# Patient Record
Sex: Female | Born: 1940
Health system: Southern US, Community
[De-identification: ages and names within clinical notes are randomized; demographics above are authoritative.]

## PROBLEM LIST (undated history)

## (undated) DIAGNOSIS — G629 Polyneuropathy, unspecified: Secondary | ICD-10-CM

## (undated) DIAGNOSIS — I8000 Phlebitis and thrombophlebitis of superficial vessels of unspecified lower extremity: Secondary | ICD-10-CM

## (undated) DIAGNOSIS — R0602 Shortness of breath: Secondary | ICD-10-CM

## (undated) DIAGNOSIS — Z9889 Other specified postprocedural states: Secondary | ICD-10-CM

## (undated) DIAGNOSIS — E039 Hypothyroidism, unspecified: Secondary | ICD-10-CM

## (undated) DIAGNOSIS — D649 Anemia, unspecified: Secondary | ICD-10-CM

## (undated) DIAGNOSIS — K5900 Constipation, unspecified: Secondary | ICD-10-CM

## (undated) DIAGNOSIS — E119 Type 2 diabetes mellitus without complications: Secondary | ICD-10-CM

## (undated) DIAGNOSIS — I8390 Asymptomatic varicose veins of unspecified lower extremity: Secondary | ICD-10-CM

## (undated) DIAGNOSIS — F419 Anxiety disorder, unspecified: Secondary | ICD-10-CM

## (undated) DIAGNOSIS — E46 Unspecified protein-calorie malnutrition: Secondary | ICD-10-CM

## (undated) DIAGNOSIS — K859 Acute pancreatitis without necrosis or infection, unspecified: Secondary | ICD-10-CM

## (undated) DIAGNOSIS — D353 Benign neoplasm of craniopharyngeal duct: Secondary | ICD-10-CM

## (undated) DIAGNOSIS — I639 Cerebral infarction, unspecified: Secondary | ICD-10-CM

## (undated) DIAGNOSIS — H269 Unspecified cataract: Secondary | ICD-10-CM

## (undated) DIAGNOSIS — I10 Essential (primary) hypertension: Secondary | ICD-10-CM

## (undated) DIAGNOSIS — R112 Nausea with vomiting, unspecified: Secondary | ICD-10-CM

## (undated) DIAGNOSIS — M810 Age-related osteoporosis without current pathological fracture: Secondary | ICD-10-CM

## (undated) DIAGNOSIS — Z780 Asymptomatic menopausal state: Secondary | ICD-10-CM

## (undated) DIAGNOSIS — F32A Depression, unspecified: Secondary | ICD-10-CM

## (undated) DIAGNOSIS — E7800 Pure hypercholesterolemia, unspecified: Secondary | ICD-10-CM

## (undated) DIAGNOSIS — G9341 Metabolic encephalopathy: Secondary | ICD-10-CM

## (undated) DIAGNOSIS — N816 Rectocele: Secondary | ICD-10-CM

## (undated) DIAGNOSIS — F329 Major depressive disorder, single episode, unspecified: Secondary | ICD-10-CM

## (undated) DIAGNOSIS — G43909 Migraine, unspecified, not intractable, without status migrainosus: Secondary | ICD-10-CM

## (undated) DIAGNOSIS — D352 Benign neoplasm of pituitary gland: Secondary | ICD-10-CM

## (undated) HISTORY — DX: Essential (primary) hypertension: I10

## (undated) HISTORY — DX: Age-related osteoporosis without current pathological fracture: M81.0

## (undated) HISTORY — DX: Hypothyroidism, unspecified: E03.9

## (undated) HISTORY — DX: Type 2 diabetes mellitus without complications: E11.9

## (undated) HISTORY — DX: Benign neoplasm of craniopharyngeal duct: D35.3

## (undated) HISTORY — DX: Depression, unspecified: F32.A

## (undated) HISTORY — PX: CYSTOSCOPY: SUR368

## (undated) HISTORY — PX: ENDOVENOUS ABLATION SAPHENOUS VEIN W/ LASER: SUR449

## (undated) HISTORY — DX: Phlebitis and thrombophlebitis of superficial vessels of unspecified lower extremity: I80.00

## (undated) HISTORY — PX: BRAIN SURGERY: SHX531

## (undated) HISTORY — DX: Rectocele: N81.6

## (undated) HISTORY — PX: LAPAROSCOPIC NISSEN FUNDOPLICATION: SHX1932

## (undated) HISTORY — DX: Constipation, unspecified: K59.00

## (undated) HISTORY — DX: Unspecified cataract: H26.9

## (undated) HISTORY — DX: Acute pancreatitis without necrosis or infection, unspecified: K85.90

## (undated) HISTORY — PX: POSTERIOR REPAIR: SHX2254

## (undated) HISTORY — DX: Benign neoplasm of pituitary gland: D35.2

## (undated) HISTORY — DX: Anxiety disorder, unspecified: F41.9

## (undated) HISTORY — PX: APPENDECTOMY: SHX54

## (undated) HISTORY — DX: Pure hypercholesterolemia, unspecified: E78.00

## (undated) HISTORY — DX: Migraine, unspecified, not intractable, without status migrainosus: G43.909

## (undated) HISTORY — DX: Asymptomatic varicose veins of unspecified lower extremity: I83.90

## (undated) HISTORY — PX: CHOLECYSTECTOMY: SHX55

## (undated) HISTORY — PX: ABDOMINAL HYSTERECTOMY: SHX81

## (undated) HISTORY — DX: Asymptomatic menopausal state: Z78.0

---
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
## 1996-12-26 HISTORY — PX: EYE SURGERY: SHX253

## 1997-10-26 HISTORY — PX: PITUITARY EXCISION: SHX745

## 1998-11-03 ENCOUNTER — Ambulatory Visit (HOSPITAL_COMMUNITY): Admission: RE | Admit: 1998-11-03 | Discharge: 1998-11-03 | Payer: Self-pay | Admitting: Neurological Surgery

## 1998-11-03 ENCOUNTER — Encounter: Payer: Self-pay | Admitting: Neurological Surgery

## 1999-06-15 ENCOUNTER — Ambulatory Visit (HOSPITAL_COMMUNITY): Admission: RE | Admit: 1999-06-15 | Discharge: 1999-06-15 | Payer: Self-pay | Admitting: *Deleted

## 1999-11-02 ENCOUNTER — Ambulatory Visit (HOSPITAL_COMMUNITY): Admission: RE | Admit: 1999-11-02 | Discharge: 1999-11-02 | Payer: Self-pay | Admitting: Neurological Surgery

## 1999-11-02 ENCOUNTER — Encounter: Payer: Self-pay | Admitting: Neurological Surgery

## 2001-05-25 ENCOUNTER — Inpatient Hospital Stay (HOSPITAL_COMMUNITY): Admission: AD | Admit: 2001-05-25 | Discharge: 2001-05-30 | Payer: Self-pay | Admitting: Pulmonary Disease

## 2001-05-26 ENCOUNTER — Encounter: Payer: Self-pay | Admitting: Internal Medicine

## 2001-07-17 ENCOUNTER — Ambulatory Visit (HOSPITAL_COMMUNITY): Admission: RE | Admit: 2001-07-17 | Discharge: 2001-07-17 | Payer: Self-pay | Admitting: Obstetrics and Gynecology

## 2001-07-17 ENCOUNTER — Encounter: Payer: Self-pay | Admitting: Obstetrics and Gynecology

## 2001-07-24 ENCOUNTER — Other Ambulatory Visit: Admission: RE | Admit: 2001-07-24 | Discharge: 2001-07-24 | Payer: Self-pay | Admitting: Obstetrics and Gynecology

## 2001-11-13 ENCOUNTER — Encounter: Payer: Self-pay | Admitting: Neurological Surgery

## 2001-11-13 ENCOUNTER — Ambulatory Visit (HOSPITAL_COMMUNITY): Admission: RE | Admit: 2001-11-13 | Discharge: 2001-11-13 | Payer: Self-pay | Admitting: Neurological Surgery

## 2001-12-11 ENCOUNTER — Encounter: Admission: RE | Admit: 2001-12-11 | Discharge: 2002-03-11 | Payer: Self-pay | Admitting: Pulmonary Disease

## 2002-11-18 ENCOUNTER — Ambulatory Visit (HOSPITAL_COMMUNITY): Admission: RE | Admit: 2002-11-18 | Discharge: 2002-11-18 | Payer: Self-pay | Admitting: Internal Medicine

## 2003-11-11 ENCOUNTER — Ambulatory Visit (HOSPITAL_COMMUNITY): Admission: RE | Admit: 2003-11-11 | Discharge: 2003-11-11 | Payer: Self-pay | Admitting: Obstetrics and Gynecology

## 2005-02-02 ENCOUNTER — Ambulatory Visit (HOSPITAL_COMMUNITY): Admission: RE | Admit: 2005-02-02 | Discharge: 2005-02-02 | Payer: Self-pay | Admitting: Obstetrics and Gynecology

## 2005-04-28 ENCOUNTER — Ambulatory Visit (HOSPITAL_COMMUNITY): Admission: RE | Admit: 2005-04-28 | Discharge: 2005-04-28 | Payer: Self-pay | Admitting: Family Medicine

## 2005-06-15 ENCOUNTER — Ambulatory Visit: Payer: Self-pay | Admitting: Endocrinology

## 2005-09-26 ENCOUNTER — Ambulatory Visit (HOSPITAL_COMMUNITY): Admission: RE | Admit: 2005-09-26 | Discharge: 2005-09-26 | Payer: Self-pay | Admitting: Pulmonary Disease

## 2006-06-13 ENCOUNTER — Ambulatory Visit: Payer: Self-pay | Admitting: Endocrinology

## 2006-06-16 ENCOUNTER — Emergency Department (HOSPITAL_COMMUNITY): Admission: EM | Admit: 2006-06-16 | Discharge: 2006-06-16 | Payer: Self-pay | Admitting: Family Medicine

## 2006-10-20 ENCOUNTER — Ambulatory Visit: Payer: Self-pay | Admitting: Endocrinology

## 2006-10-20 LAB — CONVERTED CEMR LAB
ALT: 20 units/L (ref 0–40)
AST: 20 units/L (ref 0–37)
Albumin: 3.1 g/dL — ABNORMAL LOW (ref 3.5–5.2)
Alkaline Phosphatase: 32 units/L — ABNORMAL LOW (ref 39–117)
Bilirubin, Direct: 0.1 mg/dL (ref 0.0–0.3)
Chol/HDL Ratio, serum: 2.4
Cholesterol: 136 mg/dL (ref 0–200)
HDL: 57 mg/dL (ref >39.0)
Hgb A1c MFr Bld: 6.6 % — ABNORMAL HIGH (ref 4.6–6.0)
LDL Cholesterol: 55 mg/dL (ref 0–99)
Total Bilirubin: 0.7 mg/dL (ref 0.3–1.2)
Total Protein: 6.7 g/dL (ref 6.0–8.3)
Triglyceride fasting, serum: 119 mg/dL (ref 0–149)
VLDL: 24 mg/dL (ref 0–40)

## 2007-06-07 ENCOUNTER — Ambulatory Visit: Payer: Self-pay | Admitting: Endocrinology

## 2007-06-07 LAB — CONVERTED CEMR LAB
BUN: 14 mg/dL (ref 6–23)
CO2: 31 meq/L (ref 19–32)
Calcium: 9.3 mg/dL (ref 8.4–10.5)
Chloride: 102 meq/L (ref 96–112)
Cholesterol: 160 mg/dL (ref 0–200)
Creatinine, Ser: 0.7 mg/dL (ref 0.4–1.2)
Creatinine,U: 175.1 mg/dL
Free T4: 0.5 ng/dL — ABNORMAL LOW (ref 0.6–1.6)
GFR calc Af Amer: 108 mL/min
GFR calc non Af Amer: 89 mL/min
Glucose, Bld: 136 mg/dL — ABNORMAL HIGH (ref 70–99)
HDL: 59.3 mg/dL (ref >39.0)
Hgb A1c MFr Bld: 7.9 % — ABNORMAL HIGH (ref 4.6–6.0)
LDL Cholesterol: 70 mg/dL (ref 0–99)
Microalb Creat Ratio: 6.3 mg/g (ref 0.0–30.0)
Microalb, Ur: 1.1 mg/dL (ref 0.0–1.9)
Potassium: 3.8 meq/L (ref 3.5–5.1)
Sodium: 140 meq/L (ref 135–145)
TSH: 1.91 microintl units/mL (ref 0.35–5.50)
Total CHOL/HDL Ratio: 2.7
Triglycerides: 155 mg/dL — ABNORMAL HIGH (ref 0–149)
VLDL: 31 mg/dL (ref 0–40)

## 2007-09-14 ENCOUNTER — Encounter: Payer: Self-pay | Admitting: *Deleted

## 2007-09-14 DIAGNOSIS — G43909 Migraine, unspecified, not intractable, without status migrainosus: Secondary | ICD-10-CM

## 2007-09-14 DIAGNOSIS — E1142 Type 2 diabetes mellitus with diabetic polyneuropathy: Secondary | ICD-10-CM | POA: Insufficient documentation

## 2007-09-14 DIAGNOSIS — E119 Type 2 diabetes mellitus without complications: Secondary | ICD-10-CM

## 2007-09-14 DIAGNOSIS — R5381 Other malaise: Secondary | ICD-10-CM

## 2007-09-14 DIAGNOSIS — E039 Hypothyroidism, unspecified: Secondary | ICD-10-CM | POA: Insufficient documentation

## 2007-09-14 DIAGNOSIS — I8000 Phlebitis and thrombophlebitis of superficial vessels of unspecified lower extremity: Secondary | ICD-10-CM | POA: Insufficient documentation

## 2007-09-14 DIAGNOSIS — D352 Benign neoplasm of pituitary gland: Secondary | ICD-10-CM | POA: Insufficient documentation

## 2007-09-14 DIAGNOSIS — K859 Acute pancreatitis without necrosis or infection, unspecified: Secondary | ICD-10-CM | POA: Insufficient documentation

## 2007-09-14 DIAGNOSIS — N951 Menopausal and female climacteric states: Secondary | ICD-10-CM | POA: Insufficient documentation

## 2007-09-14 DIAGNOSIS — D353 Benign neoplasm of craniopharyngeal duct: Secondary | ICD-10-CM | POA: Insufficient documentation

## 2007-09-14 DIAGNOSIS — R5383 Other fatigue: Secondary | ICD-10-CM | POA: Insufficient documentation

## 2007-09-14 HISTORY — DX: Benign neoplasm of craniopharyngeal duct: D35.3

## 2007-09-14 HISTORY — DX: Benign neoplasm of pituitary gland: D35.2

## 2007-09-14 HISTORY — DX: Acute pancreatitis without necrosis or infection, unspecified: K85.90

## 2007-09-14 HISTORY — DX: Type 2 diabetes mellitus without complications: E11.9

## 2007-09-14 HISTORY — DX: Phlebitis and thrombophlebitis of superficial vessels of unspecified lower extremity: I80.00

## 2007-09-14 HISTORY — DX: Migraine, unspecified, not intractable, without status migrainosus: G43.909

## 2007-09-14 HISTORY — DX: Hypothyroidism, unspecified: E03.9

## 2007-09-20 ENCOUNTER — Ambulatory Visit (HOSPITAL_COMMUNITY): Admission: RE | Admit: 2007-09-20 | Discharge: 2007-09-20 | Payer: Self-pay | Admitting: Obstetrics and Gynecology

## 2007-11-19 ENCOUNTER — Ambulatory Visit: Payer: Self-pay | Admitting: Endocrinology

## 2007-11-20 LAB — CONVERTED CEMR LAB
FSH: 11.5 milliintl units/mL
Free T4: 0.8 ng/dL (ref 0.6–1.6)
Hgb A1c MFr Bld: 7.9 % — ABNORMAL HIGH (ref 4.6–6.0)
LH: 3.4 milliintl units/mL
TSH: 1.9 microintl units/mL (ref 0.35–5.50)

## 2007-11-27 ENCOUNTER — Telehealth (INDEPENDENT_AMBULATORY_CARE_PROVIDER_SITE_OTHER): Payer: Self-pay | Admitting: *Deleted

## 2007-12-17 ENCOUNTER — Telehealth (INDEPENDENT_AMBULATORY_CARE_PROVIDER_SITE_OTHER): Payer: Self-pay | Admitting: *Deleted

## 2008-01-30 ENCOUNTER — Telehealth (INDEPENDENT_AMBULATORY_CARE_PROVIDER_SITE_OTHER): Payer: Self-pay | Admitting: *Deleted

## 2008-02-01 ENCOUNTER — Ambulatory Visit: Payer: Self-pay | Admitting: Endocrinology

## 2008-02-01 DIAGNOSIS — T678XXA Other effects of heat and light, initial encounter: Secondary | ICD-10-CM | POA: Insufficient documentation

## 2008-02-01 LAB — CONVERTED CEMR LAB
Basophils Absolute: 0 10*3/uL (ref 0.0–0.1)
Basophils Relative: 0.3 % (ref 0.0–1.0)
Eosinophils Absolute: 0.6 10*3/uL (ref 0.0–0.6)
Eosinophils Relative: 5.1 % — ABNORMAL HIGH (ref 0.0–5.0)
HCT: 41 % (ref 36.0–46.0)
Hemoglobin: 14.2 g/dL (ref 12.0–15.0)
Hgb A1c MFr Bld: 8.4 % — ABNORMAL HIGH (ref 4.6–6.0)
Lymphocytes Relative: 26.5 % (ref 12.0–46.0)
MCHC: 34.6 g/dL (ref 30.0–36.0)
MCV: 93 fL (ref 78.0–100.0)
Monocytes Absolute: 0.4 10*3/uL (ref 0.2–0.7)
Monocytes Relative: 3.8 % (ref 3.0–11.0)
Neutro Abs: 7.1 10*3/uL (ref 1.4–7.7)
Neutrophils Relative %: 64.3 % (ref 43.0–77.0)
Platelets: 227 10*3/uL (ref 150–400)
RBC: 4.41 M/uL (ref 3.87–5.11)
RDW: 12.6 % (ref 11.5–14.6)
TSH: 1.65 microintl units/mL (ref 0.35–5.50)
WBC: 11 10*3/uL — ABNORMAL HIGH (ref 4.5–10.5)

## 2008-06-12 ENCOUNTER — Encounter: Payer: Self-pay | Admitting: Endocrinology

## 2008-06-23 ENCOUNTER — Encounter: Payer: Self-pay | Admitting: Endocrinology

## 2008-06-30 ENCOUNTER — Ambulatory Visit (HOSPITAL_COMMUNITY): Admission: RE | Admit: 2008-06-30 | Discharge: 2008-06-30 | Payer: Self-pay | Admitting: Pulmonary Disease

## 2008-07-02 ENCOUNTER — Encounter: Payer: Self-pay | Admitting: Endocrinology

## 2008-07-04 ENCOUNTER — Ambulatory Visit (HOSPITAL_COMMUNITY): Admission: RE | Admit: 2008-07-04 | Discharge: 2008-07-04 | Payer: Self-pay | Admitting: Pulmonary Disease

## 2008-10-28 ENCOUNTER — Ambulatory Visit (HOSPITAL_COMMUNITY): Admission: RE | Admit: 2008-10-28 | Discharge: 2008-10-28 | Payer: Self-pay | Admitting: Obstetrics and Gynecology

## 2008-12-15 ENCOUNTER — Telehealth: Payer: Self-pay | Admitting: Endocrinology

## 2009-01-08 ENCOUNTER — Telehealth: Payer: Self-pay | Admitting: Endocrinology

## 2009-01-22 ENCOUNTER — Telehealth: Payer: Self-pay | Admitting: Endocrinology

## 2009-01-27 ENCOUNTER — Telehealth: Payer: Self-pay | Admitting: Endocrinology

## 2009-02-09 ENCOUNTER — Ambulatory Visit: Payer: Self-pay | Admitting: Endocrinology

## 2009-02-09 DIAGNOSIS — E7800 Pure hypercholesterolemia, unspecified: Secondary | ICD-10-CM | POA: Insufficient documentation

## 2009-02-09 DIAGNOSIS — M810 Age-related osteoporosis without current pathological fracture: Secondary | ICD-10-CM

## 2009-02-09 DIAGNOSIS — I10 Essential (primary) hypertension: Secondary | ICD-10-CM | POA: Insufficient documentation

## 2009-02-09 DIAGNOSIS — Z780 Asymptomatic menopausal state: Secondary | ICD-10-CM | POA: Insufficient documentation

## 2009-02-09 HISTORY — DX: Pure hypercholesterolemia, unspecified: E78.00

## 2009-02-09 HISTORY — DX: Asymptomatic menopausal state: Z78.0

## 2009-02-09 HISTORY — DX: Essential (primary) hypertension: I10

## 2009-02-09 HISTORY — DX: Age-related osteoporosis without current pathological fracture: M81.0

## 2009-02-11 LAB — CONVERTED CEMR LAB
ALT: 26 units/L (ref 0–35)
AST: 25 units/L (ref 0–37)
Albumin: 3.7 g/dL (ref 3.5–5.2)
Alkaline Phosphatase: 35 units/L — ABNORMAL LOW (ref 39–117)
BUN: 14 mg/dL (ref 6–23)
Basophils Absolute: 0 10*3/uL (ref 0.0–0.1)
Basophils Relative: 0.4 % (ref 0.0–3.0)
Bilirubin Urine: NEGATIVE
Bilirubin, Direct: 0.1 mg/dL (ref 0.0–0.3)
CO2: 31 meq/L (ref 19–32)
Calcium: 9.2 mg/dL (ref 8.4–10.5)
Chloride: 103 meq/L (ref 96–112)
Cholesterol: 141 mg/dL (ref 0–200)
Creatinine, Ser: 0.6 mg/dL (ref 0.4–1.2)
Creatinine,U: 148.4 mg/dL
Crystals: NEGATIVE
Eosinophils Absolute: 0.6 10*3/uL (ref 0.0–0.7)
Eosinophils Relative: 5.5 % — ABNORMAL HIGH (ref 0.0–5.0)
GFR calc Af Amer: 128 mL/min
GFR calc non Af Amer: 106 mL/min
Glucose, Bld: 121 mg/dL — ABNORMAL HIGH (ref 70–99)
HCT: 44.6 % (ref 36.0–46.0)
HDL: 53.8 mg/dL (ref >39.0)
Hemoglobin, Urine: NEGATIVE
Hemoglobin: 15.3 g/dL — ABNORMAL HIGH (ref 12.0–15.0)
Hgb A1c MFr Bld: 8.2 % — ABNORMAL HIGH (ref 4.6–6.0)
Ketones, ur: NEGATIVE mg/dL
LDL Cholesterol: 66 mg/dL (ref 0–99)
Leukocytes, UA: NEGATIVE
Lymphocytes Relative: 28.8 % (ref 12.0–46.0)
MCHC: 34.2 g/dL (ref 30.0–36.0)
MCV: 94.8 fL (ref 78.0–100.0)
Microalb Creat Ratio: 20.2 mg/g (ref 0.0–30.0)
Microalb, Ur: 3 mg/dL — ABNORMAL HIGH (ref 0.0–1.9)
Monocytes Absolute: 1.3 10*3/uL — ABNORMAL HIGH (ref 0.1–1.0)
Monocytes Relative: 11.9 % (ref 3.0–12.0)
Neutro Abs: 5.9 10*3/uL (ref 1.4–7.7)
Neutrophils Relative %: 53.4 % (ref 43.0–77.0)
Nitrite: NEGATIVE
Platelets: 210 10*3/uL (ref 150–400)
Potassium: 4.5 meq/L (ref 3.5–5.1)
RBC: 4.71 M/uL (ref 3.87–5.11)
RDW: 12.6 % (ref 11.5–14.6)
Sodium: 141 meq/L (ref 135–145)
Specific Gravity, Urine: >=1.03 (ref 1.000–1.03)
TSH: 1.85 microintl units/mL (ref 0.35–5.50)
Total Bilirubin: 0.5 mg/dL (ref 0.3–1.2)
Total CHOL/HDL Ratio: 2.6
Total Protein, Urine: NEGATIVE mg/dL
Total Protein: 6.9 g/dL (ref 6.0–8.3)
Triglycerides: 106 mg/dL (ref 0–149)
Urine Glucose: NEGATIVE mg/dL
Urobilinogen, UA: 0.2 (ref 0.0–1.0)
VLDL: 21 mg/dL (ref 0–40)
WBC: 11 10*3/uL — ABNORMAL HIGH (ref 4.5–10.5)
pH: 5.5 (ref 5.0–8.0)

## 2009-02-17 ENCOUNTER — Telehealth: Payer: Self-pay | Admitting: Endocrinology

## 2009-02-18 ENCOUNTER — Encounter: Payer: Self-pay | Admitting: Endocrinology

## 2009-03-31 ENCOUNTER — Telehealth: Payer: Self-pay | Admitting: Endocrinology

## 2009-11-06 ENCOUNTER — Telehealth (INDEPENDENT_AMBULATORY_CARE_PROVIDER_SITE_OTHER): Payer: Self-pay | Admitting: *Deleted

## 2009-11-10 ENCOUNTER — Encounter: Payer: Self-pay | Admitting: Endocrinology

## 2009-11-12 ENCOUNTER — Encounter: Payer: Self-pay | Admitting: Endocrinology

## 2010-01-26 ENCOUNTER — Ambulatory Visit: Payer: Self-pay | Admitting: Endocrinology

## 2010-03-15 ENCOUNTER — Ambulatory Visit (HOSPITAL_COMMUNITY): Admission: RE | Admit: 2010-03-15 | Discharge: 2010-03-15 | Payer: Self-pay | Admitting: Obstetrics and Gynecology

## 2010-03-23 ENCOUNTER — Telehealth: Payer: Self-pay | Admitting: Endocrinology

## 2010-09-21 ENCOUNTER — Telehealth: Payer: Self-pay | Admitting: Endocrinology

## 2010-10-26 LAB — HM MAMMOGRAPHY: HM Mammogram: NORMAL

## 2010-12-21 ENCOUNTER — Telehealth: Payer: Self-pay | Admitting: Endocrinology

## 2010-12-28 ENCOUNTER — Ambulatory Visit (HOSPITAL_COMMUNITY)
Admission: RE | Admit: 2010-12-28 | Discharge: 2010-12-28 | Payer: Self-pay | Source: Home / Self Care | Attending: Pulmonary Disease | Admitting: Pulmonary Disease

## 2011-01-05 ENCOUNTER — Ambulatory Visit: Admission: RE | Admit: 2011-01-05 | Payer: Self-pay | Source: Home / Self Care | Admitting: Vascular Surgery

## 2011-01-05 ENCOUNTER — Ambulatory Visit
Admission: RE | Admit: 2011-01-05 | Discharge: 2011-01-05 | Payer: Self-pay | Source: Home / Self Care | Attending: Vascular Surgery | Admitting: Vascular Surgery

## 2011-01-15 ENCOUNTER — Encounter: Payer: Self-pay | Admitting: Obstetrics and Gynecology

## 2011-01-18 ENCOUNTER — Telehealth: Payer: Self-pay | Admitting: Endocrinology

## 2011-01-25 NOTE — Progress Notes (Signed)
Summary: rx refill req  Phone Note Refill Request Message from:  Fax from Pharmacy on September 21, 2010 10:49 AM  Refills Requested: Medication #1:  SYNTHROID 50 MCG  TABS TAKE 1 by mouth QD   Dosage confirmed as above?Dosage Confirmed   Last Refilled: 08/21/2010  Method Requested: Electronic Initial call taken by: Brenton Grills MA,  September 21, 2010 10:49 AM    Prescriptions: SYNTHROID 50 MCG  TABS (LEVOTHYROXINE SODIUM) TAKE 1 by mouth QD  #30 x 2   Entered by:   Brenton Grills MA   Authorized by:   Minus Breeding MD   Signed by:   Brenton Grills MA on 09/21/2010   Method used:   Electronically to        [facility]* (retail)       [address]       [place], [place]  [address]       Ph: [phone] or [phone]       Fax: [phone]   RxID:   [id number]

## 2011-01-25 NOTE — Assessment & Plan Note (Signed)
Summary: MEDS--FU---STC   Vital Signs:  Patient profile:   70 year old female Height:      67 inches (170.18 cm) Weight:      189.38 pounds (86.08 kg) BMI:     29.77 O2 Sat:      97 % on Room air Temp:     96.6 degrees F (35.89 degrees C) oral Pulse rate:   119 / minute BP sitting:   120 / 84  (left arm) Cuff size:   large  Vitals Entered By: Josph Macho CMA (January 26, 2010 11:29 AM)  O2 Flow:  Room air CC: Meds/ Follow up/ pt states she is no longer taking Alprazolam or Darvocet/ CF Is Patient Diabetic? Yes   CC:  Meds/ Follow up/ pt states she is no longer taking Alprazolam or Darvocet/ CF.  History of Present Illness: pt says her a1c was 7, a few mos ago.  she seldom has hypoglycemia, and these episodes are mild, usually when lunch is delayed.    Current Medications (verified): 1)  Synthroid 50 Mcg  Tabs (Levothyroxine Sodium) .... Take 1 By Mouth Qd 2)  Multivitamins   Tabs (Multiple Vitamin) .... Take 1 By Mouth Qd 3)  Zestril 20 Mg  Tabs (Lisinopril) .... Take 1/2 Tab By Mouth Qd 4)  Effexor Xr 150 Mg  Cp24 (Venlafaxine Hcl) .... Take 1 By Mouth Qd 5)  Zocor 80 Mg  Tabs (Simvastatin) .... Take 1 By Mouth Qd 6)  Lyrica 150 Mg  Caps (Pregabalin) .... Take 1 By Mouth Two Times A Day Qd 7)  Maxalt 10 Mg  Tabs (Rizatriptan Benzoate) .... Take 1 By Mouth Once Daily Prn 8)  Alprazolam 0.5 Mg  Tabs (Alprazolam) .... Take 1-2 By Mouth Once Daily Prn 9)  Glucophage Xr 500 Mg Tb24 (Metformin Hcl) .... Take 2 Tablet By Mouth Once A Day 10)  Estrace 1 Mg  Tabs (Estradiol) .... Take 1 By Mouth Qd 11)  Amaryl 4 Mg  Tabs (Glimepiride) .... 2-Qam 12)  Actos 45 Mg  Tabs (Pioglitazone Hcl) .... Qd 13)  Omeprazole 20 Mg Cpdr (Omeprazole) .... Take 1 By Mouth Qd 14)  Darvocet-N 100 100-650 Mg Tabs (Propoxyphene N-Apap) .... Take 1-2 By Mouth Once Daily Prn 15)  Dexa .... V49.81 16)  Januvia 100 Mg Tabs (Sitagliptin Phosphate) .... Qd 17)  Hydrocodone .... As Needed  Allergies  (verified): 1)  ! Penicillin  Past History:  Past Medical History: Last updated: 09/14/2007 Diabetes mellitus, type II Hypothyroidism  Review of Systems  The patient denies peripheral edema.    Physical Exam  General:  normal appearance.   Extremities:  no edema   Impression & Recommendations:  Problem # 1:  DIABETES MELLITUS, TYPE II (ICD-250.00) overcontrolled for a sulfonylurea-containing regimen  Medications Added to Medication List This Visit: 1)  Amaryl 4 Mg Tabs (Glimepiride) .Marland Kitchen.. 1 tab each am 2)  Hydrocodone  .... As needed  Other Orders: Est. Patient Level III ([address])  Patient Instructions: 1)  reduce glimepiride to 4 mg each am 2)  return 6 months. 3)  continue other meds the same. 4)  please sign release of info for the most recent a1c. Prescriptions: ZOCOR 80 MG  TABS (SIMVASTATIN) TAKE 1 by mouth QD  #30 x 11   Entered and Authorized by:   Minus Breeding MD   Signed by:   Minus Breeding MD on 01/26/2010   Method used:   Electronically to  [facility]* (retail)       [address]       [place], [place]  [address]       Ph: [phone] or [phone]       Fax: [phone]   RxID:   [id number] JANUVIA 100 MG TABS (SITAGLIPTIN PHOSPHATE) qd  #30 x 11   Entered and Authorized by:   Minus Breeding MD   Signed by:   Minus Breeding MD on 01/26/2010   Method used:   Electronically to        [facility] (retail)       [address]       [place], [place]  [address]       Ph: [phone] or [phone]       Fax: [phone]   RxID:   [id number] ACTOS 45 MG  TABS (PIOGLITAZONE HCL) qd  #30 x 11   Entered and Authorized by:   Minus Breeding MD   Signed by:   Minus Breeding MD on 01/26/2010   Method used:   Electronically to        [facility] (retail)       [address]       [place], [place]  [address]       Ph: [phone] or  [phone]       Fax: [phone]   RxID:   [id number] GLUCOPHAGE XR 500 MG TB24 (METFORMIN HCL) Take 2 tablet by mouth once a day  #60 x 11   Entered and Authorized by:   Minus Breeding MD   Signed by:   Minus Breeding MD on 01/26/2010   Method used:   Electronically to        [facility] (retail)       [address]       [place], [place]  [address]       Ph: [phone] or [phone]       Fax: [phone]   RxID:   [id number] AMARYL 4 MG  TABS (GLIMEPIRIDE) 1 tab each am  #30 x 11   Entered and Authorized by:   Minus Breeding MD   Signed by:   Minus Breeding MD on 01/26/2010   Method used:   Electronically to        [facility] (retail)       [address]       [place], [place]  [address]       Ph: [phone] or [phone]       Fax: [phone]   RxID:   [id number]

## 2011-01-25 NOTE — Progress Notes (Signed)
  Phone Note Refill Request Message from:  Fax from Pharmacy on March 23, 2010 9:39 AM  Refills Requested: Medication #1:  SYNTHROID 50 MCG  TABS TAKE 1 by mouth QD   Dosage confirmed as above?Dosage Confirmed Initial call taken by: Josph Macho RMA,  March 23, 2010 9:39 AM    Prescriptions: SYNTHROID 50 MCG  TABS (LEVOTHYROXINE SODIUM) TAKE 1 by mouth QD  #30 x 5   Entered by:   Josph Macho RMA   Authorized by:   Minus Breeding MD   Signed by:   Josph Macho RMA on 03/23/2010   Method used:   Electronically to        [facility] (retail)       [address]       [place], [place]  [address]       Ph: [phone] or [phone]       Fax: [phone]   RxID:   [id number]

## 2011-01-27 NOTE — Progress Notes (Signed)
Summary: rx refill req  Phone Note Refill Request Message from:  Fax from Pharmacy on January 18, 2011 10:59 AM  Refills Requested: Medication #1:  AMARYL 4 MG  TABS 1 tab each am   Dosage confirmed as above?Dosage Confirmed   Last Refilled: 10/20/2010  Method Requested: Electronic Next Appointment Scheduled: none Initial call taken by: Brenton Grills CMA (AAMA),  January 18, 2011 11:01 AM    Prescriptions: AMARYL 4 MG  TABS (GLIMEPIRIDE) 1 tab each am  #30 x 0   Entered by:   Brenton Grills CMA (AAMA)   Authorized by:   Minus Breeding MD   Signed by:   Brenton Grills CMA (AAMA) on 01/18/2011   Method used:   Electronically to        [facility] (retail)       [address]       [place], [place]  [address]       Ph: [phone] or [phone]       Fax: [phone]   RxID:   [id number]

## 2011-01-27 NOTE — Progress Notes (Signed)
Summary: synthroid  Phone Note Refill Request Message from:  Fax from Pharmacy on December 21, 2010 11:42 AM  Refills Requested: Medication #1:  SYNTHROID 50 MCG  TABS TAKE 1 by mouth QD [facility]   Method Requested: Electronic Initial call taken by: Orlan Leavens RMA,  December 21, 2010 11:43 AM    Prescriptions: SYNTHROID 50 MCG  TABS (LEVOTHYROXINE SODIUM) TAKE 1 by mouth QD  #30 x 2   Entered by:   Orlan Leavens RMA   Authorized by:   Minus Breeding MD   Signed by:   Orlan Leavens RMA on 12/21/2010   Method used:   Electronically to        [facility] (retail)       [address]       [place], [place]  [address]       Ph: [phone] or [phone]       Fax: [phone]   RxID:   [id number]

## 2011-01-31 ENCOUNTER — Ambulatory Visit (INDEPENDENT_AMBULATORY_CARE_PROVIDER_SITE_OTHER): Payer: MEDICARE | Admitting: Endocrinology

## 2011-01-31 ENCOUNTER — Other Ambulatory Visit: Payer: Self-pay | Admitting: Endocrinology

## 2011-01-31 ENCOUNTER — Encounter: Payer: Self-pay | Admitting: Endocrinology

## 2011-01-31 ENCOUNTER — Encounter (INDEPENDENT_AMBULATORY_CARE_PROVIDER_SITE_OTHER): Payer: Self-pay | Admitting: *Deleted

## 2011-01-31 ENCOUNTER — Other Ambulatory Visit: Payer: MEDICARE

## 2011-01-31 DIAGNOSIS — E119 Type 2 diabetes mellitus without complications: Secondary | ICD-10-CM

## 2011-01-31 DIAGNOSIS — Z79899 Other long term (current) drug therapy: Secondary | ICD-10-CM

## 2011-01-31 DIAGNOSIS — E7800 Pure hypercholesterolemia, unspecified: Secondary | ICD-10-CM

## 2011-01-31 DIAGNOSIS — E039 Hypothyroidism, unspecified: Secondary | ICD-10-CM

## 2011-01-31 LAB — TSH: TSH: 1.55 u[IU]/mL (ref 0.35–5.50)

## 2011-01-31 LAB — HEPATIC FUNCTION PANEL
ALT: 27 U/L (ref 0–35)
AST: 21 U/L (ref 0–37)
Albumin: 3.6 g/dL (ref 3.5–5.2)
Alkaline Phosphatase: 34 U/L — ABNORMAL LOW (ref 39–117)
Bilirubin, Direct: 0.1 mg/dL (ref 0.0–0.3)
Total Bilirubin: 0.4 mg/dL (ref 0.3–1.2)
Total Protein: 6.6 g/dL (ref 6.0–8.3)

## 2011-01-31 LAB — LIPID PANEL
Cholesterol: 143 mg/dL (ref 0–200)
HDL: 56.4 mg/dL (ref >39.00)
LDL Cholesterol: 58 mg/dL (ref 0–99)
Total CHOL/HDL Ratio: 3
Triglycerides: 141 mg/dL (ref 0.0–149.0)
VLDL: 28.2 mg/dL (ref 0.0–40.0)

## 2011-01-31 LAB — HEMOGLOBIN A1C: Hgb A1c MFr Bld: 8 % — ABNORMAL HIGH (ref 4.6–6.5)

## 2011-02-01 ENCOUNTER — Telehealth: Payer: Self-pay | Admitting: Endocrinology

## 2011-02-10 NOTE — Progress Notes (Signed)
Summary: welchol?  Phone Note Call from Patient Call back at Home Phone [phone] [facility]     Caller: Patient Summary of Call: Pt states that she heard results on phone tree message and would like to try Welchol before starting insulin Initial call taken by: Brenton Grills CMA Duncan Dull),  February 01, 2011 2:27 PM  Follow-up for Phone Call        rx sent ret 3 mos Follow-up by: Minus Breeding MD,  February 01, 2011 3:00 PM  Additional Follow-up for Phone Call Additional follow up Details #1::        called pt no [facility] to [facility] pharmacy Additional Follow-up by: Orlan Leavens RMA,  February 01, 2011 3:16 PM    New/Updated Medications: WELCHOL 625 MG TABS (COLESEVELAM HCL) 6 tabs once daily Prescriptions: WELCHOL 625 MG TABS (COLESEVELAM HCL) 6 tabs once daily  #180 x 11   Entered and Authorized by:   Minus Breeding MD   Signed by:   Minus Breeding MD on 02/01/2011   Method used:   Electronically to        [facility] (retail)       [address]       [place], [place]  [address]       Ph: [phone] or [phone]       Fax: [phone]   RxID:   [id number]

## 2011-02-10 NOTE — Assessment & Plan Note (Signed)
Summary: fu-lb   Vital Signs:  Patient profile:   70 year old female Menstrual status:  hysterectomy Height:      67 inches (170.18 cm) Weight:      184.75 pounds (83.98 kg) BMI:     29.04 O2 Sat:      95 % on Room air Temp:     97.5 degrees F (36.39 degrees C) oral Pulse rate:   107 / minute BP sitting:   110 / 70  (left arm) Cuff size:   regular  Vitals Entered By: Brenton Grills CMA Duncan Dull) (January 31, 2011 1:54 PM)  O2 Flow:  Room air CC: Follow up/check thyroid/aj Is Patient Diabetic? Yes     Menstrual Status hysterectomy   Primary Provider:  Rulon Sera  CC:  Follow up/check thyroid/aj.  History of Present Illness: the status of at least 3 ongoing medical problems is addressed today: dm: pt states she feels well in general.  no cbg record, but states cbg's are well-controlled.  she seldom has hypoglycemia sxs.  cbg's are in general higher in the day.   dyslipidemia:  she denies chest pain.  she takes zocor as rx'ed. hypothyroidism:  she takes synthroid as rx'ed.  she denies weight gain  Current Medications (verified): 1)  Synthroid 50 Mcg  Tabs (Levothyroxine Sodium) .... Take 1 By Mouth Qd 2)  Multivitamins   Tabs (Multiple Vitamin) .... Take 1 By Mouth Qd 3)  Zestril 20 Mg  Tabs (Lisinopril) .... Take 1/2 Tab By Mouth Qd 4)  Effexor Xr 150 Mg  Cp24 (Venlafaxine Hcl) .... Take 1 By Mouth Qd 5)  Zocor 80 Mg  Tabs (Simvastatin) .... Take 1 By Mouth Qd 6)  Lyrica 150 Mg  Caps (Pregabalin) .... Take 1 By Mouth Two Times A Day Qd 7)  Maxalt 10 Mg  Tabs (Rizatriptan Benzoate) .... Take 1 By Mouth Once Daily Prn 8)  Glucophage Xr 500 Mg Tb24 (Metformin Hcl) .... Take 2 Tablet By Mouth Once A Day 9)  Estrace 1 Mg  Tabs (Estradiol) .... Take 1 By Mouth Qd 10)  Amaryl 4 Mg  Tabs (Glimepiride) .Marland Kitchen.. 1 Tab Each Am 11)  Actos 45 Mg  Tabs (Pioglitazone Hcl) .... Qd 12)  Omeprazole 20 Mg Cpdr (Omeprazole) .... Take 1 By Mouth Qd 13)  Dexa .... V49.81 14)  Januvia 100 Mg Tabs  (Sitagliptin Phosphate) .... Qd 15)  Hydrocodone .... As Needed 16)  Proair Hfa 108 (90 Base) Mcg/act Aers (Albuterol Sulfate) .... 2 Puffs Four Times A Day 17)  Qvar 80 Mcg/act Aers (Beclomethasone Dipropionate) .... 2 Puffs Two Times A Day  Allergies (verified): 1)  ! 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         she attributes mild doe to asthma  Physical Exam  General:  normal appearance.   Neck:  Supple without thyroid enlargement or tenderness.  Pulses:  dorsalis pedis intact bilat.   Extremities:  no deformity.  no ulcer on the feet.  feet are of normal color and temp.  no edema  Neurologic:  sensation is intact to touch on the feet. Additional Exam:  Hemoglobin A1C       [  H]  8.0 %   LDL Cholesterol           58 mg/dL     Impression & Recommendations:  Problem # 1:  DIABETES MELLITUS, TYPE II (ICD-250.00) needs increased rx  Problem # 2:  HYPERCHOLESTEROLEMIA (ICD-272.0) well-controlled  Problem # 3:  HYPOTHYROIDISM (ICD-244.9)  Medications Added to Medication List This Visit: 1)  Proair Hfa 108 (90 Base) Mcg/act Aers (Albuterol sulfate) .... 2 puffs four times a day 2)  Qvar 80 Mcg/act Aers (Beclomethasone dipropionate) .... 2 puffs two times a day  Other Orders: TLB-TSH (Thyroid Stimulating Hormone) (84443-TSH) TLB-A1C / Hgb A1C (Glycohemoglobin) (83036-A1C) TLB-Lipid Panel (80061-LIPID) TLB-Hepatic/Liver Function Pnl  (80076-HEPATIC) Est. Patient Level IV ([address])  Patient Instructions: 1)  blood tests are being ordered for you today.  please call [phone] to hear your test results. 2)  pending the test results, please continue the same medications for now. 3)  good diet and exercise habits significanly improve the control of your diabetes.  please let me know if you wish to be referred to a dietician.  high blood sugar is very risky to your health.  you should see an eye doctor every year. 4)  controlling your blood pressure and cholesterol drastically reduces the damage diabetes does to your body.  this also applies to quitting smoking.  please discuss these with your doctor.  you should take an aspirin every day, unless you have been advised by a doctor not to. 5)  check your blood sugar 1 times a day.  vary the time of day when you check, between before the 3 meals, and at bedtime.  also check if you have symptoms of your blood sugar being too high or too low.  please keep a record of the readings and bring it to your next appointment here.  please call us sooner if you are having low blood sugar episodes. 6)  Please schedule a follow-up appointment in 6 months. 7)  (update: i left message on phone-tree:  options are insulin, addition of welchol, addition of parlodel, or change januvia to victoza).   Orders Added: 1)  TLB-TSH (Thyroid Stimulating Hormone) [84443-TSH] 2)  TLB-A1C / Hgb A1C (Glycohemoglobin) [83036-A1C] 3)  TLB-Lipid Panel [80061-LIPID] 4)  TLB-Hepatic/Liver Function Pnl [80076-HEPATIC] 5)  Est. Patient Level IV [[address]]   Immunization History:  Influenza Immunization History:    Influenza:  historical (10/26/2010)   Immunization History:  Influenza Immunization History:    Influenza:  Historical (10/26/2010)   Preventive Care Screening  Colonoscopy:    Date:  12/26/2010    Results:  done   Mammogram:    Date:  10/26/2010    Results:  normal   Last Tetanus Booster:     Date:  12/26/2001    Results:  Historical

## 2011-02-11 ENCOUNTER — Telehealth: Payer: Self-pay | Admitting: Endocrinology

## 2011-02-16 NOTE — Progress Notes (Signed)
Summary: rx refill req  Phone Note Refill Request Message from:  Fax from Pharmacy on February 11, 2011 1:53 PM  Refills Requested: Medication #1:  GLUCOPHAGE XR 500 MG TB24 Take 2 tablet by mouth once a day   Dosage confirmed as above?Dosage Confirmed   Last Refilled: 01/14/2011  Medication #2:  AMARYL 4 MG  TABS 1 tab each am   Last Refilled: 01/18/2011  Medication #3:  ZOCOR 80 MG  TABS TAKE 1 by mouth QD   Dosage confirmed as above?Dosage Confirmed   Last Refilled: 01/18/2011  Medication #4:  ACTOS 45 MG  TABS qd   Dosage confirmed as above?Dosage Confirmed   Last Refilled: 01/14/2011  Method Requested: Electronic Next Appointment Scheduled: none Initial call taken by: Brenton Grills CMA (AAMA),  February 11, 2011 1:56 PM    Prescriptions: ACTOS 45 MG  TABS (PIOGLITAZONE HCL) qd  #30 x 5   Entered by:   Brenton Grills CMA (AAMA)   Authorized by:   Minus Breeding MD   Signed by:   Brenton Grills CMA (AAMA) on 02/11/2011   Method used:   Electronically to        [facility] (retail)       [address]       [place], [place]  [address]       Ph: [phone] or [phone]       Fax: [phone]   RxID:   [id number] AMARYL 4 MG  TABS (GLIMEPIRIDE) 1 tab each am  #30 x 1   Entered by:   Brenton Grills CMA (AAMA)   Authorized by:   Minus Breeding MD   Signed by:   Brenton Grills CMA (AAMA) on 02/11/2011   Method used:   Electronically to        [facility] (retail)       [address]       [place], [place]  [address]       Ph: [phone] or [phone]       Fax: [phone]   RxID:   [id number] GLUCOPHAGE XR 500 MG TB24 (METFORMIN HCL) Take 2 tablet by mouth once a day  #60 x 5   Entered by:   Brenton Grills CMA (AAMA)   Authorized by:   Minus Breeding MD   Signed by:   Brenton Grills CMA (AAMA) on 02/11/2011   Method used:   Electronically to        [facility] (retail)   [address]       [place], [place]  [address]       Ph: [phone] or [phone]       Fax: [phone]   RxID:   [id number] ZOCOR 80 MG  TABS (SIMVASTATIN) TAKE 1 by mouth QD  #30 x 5   Entered by:   Brenton Grills CMA (AAMA)   Authorized by:   Minus Breeding MD   Signed by:   Brenton Grills CMA (AAMA) on 02/11/2011   Method used:   Electronically to        [facility] (retail)       [address]       [place], [place]  [address]       Ph: [phone] or [phone]  Fax: [phone]   RxID:   [id number]

## 2011-02-18 ENCOUNTER — Telehealth: Payer: Self-pay | Admitting: Endocrinology

## 2011-02-22 NOTE — Progress Notes (Signed)
Summary: [name] White  Phone Note Refill Request Message from:  Fax from Pharmacy on February 18, 2011 9:22 AM  Refills Requested: Medication #1:  JANUVIA 100 MG TABS qd   Dosage confirmed as above?Dosage Confirmed   Last Refilled: 01/25/2011  Method Requested: Electronic Next Appointment Scheduled: none Initial call taken by: Brenton Grills CMA Duncan Dull),  February 18, 2011 9:23 AM    Prescriptions: JANUVIA 100 MG TABS (SITAGLIPTIN PHOSPHATE) qd  #30 x 11   Entered by:   Brenton Grills CMA (AAMA)   Authorized by:   Minus Breeding MD   Signed by:   Brenton Grills CMA (AAMA) on 02/18/2011   Method used:   Electronically to        [facility] (retail)       [address]       [place], [place]  [address]       Ph: [phone] or [phone]       Fax: [phone]   RxID:   [id number]

## 2011-03-15 ENCOUNTER — Other Ambulatory Visit: Payer: Self-pay | Admitting: Endocrinology

## 2011-03-15 MED ORDER — LEVOTHYROXINE SODIUM 50 MCG PO TABS: 50.0000 ug | ORAL_TABLET | Freq: Every day | ORAL | Status: DC

## 2011-04-06 ENCOUNTER — Ambulatory Visit (INDEPENDENT_AMBULATORY_CARE_PROVIDER_SITE_OTHER): Payer: Medicare Other | Admitting: Vascular Surgery

## 2011-04-06 ENCOUNTER — Encounter (INDEPENDENT_AMBULATORY_CARE_PROVIDER_SITE_OTHER): Payer: Medicare Other

## 2011-04-06 DIAGNOSIS — I872 Venous insufficiency (chronic) (peripheral): Secondary | ICD-10-CM

## 2011-04-06 DIAGNOSIS — I8000 Phlebitis and thrombophlebitis of superficial vessels of unspecified lower extremity: Secondary | ICD-10-CM

## 2011-04-07 NOTE — Assessment & Plan Note (Signed)
OFFICE VISIT  BLENDA, WISECUP DOB:  January 09, 1941                                       04/06/2011 ZOXWR#:[id number]  The patient presents today for continued follow-up of her venous hypertension.  She has had no further bleeding from her telangiectasias since my last visit with him 3 months ago.  She continues to have discomfort over her calves with prolonged standing.  She has been wearing her compression garments with minimal relief.  She does have some discomfort in wearing the compression.  On physical exam blood pressure is 121/85, pulse 91, respiratory rate 22.  She does have 2+ dorsalis pedis pulses bilaterally.  She has raised telangiectasia over both lower extremities, quite thin skin over these. She does have an area of chronic venous stasis changes in the medial calf above the ankle on the right.  She did undergo noninvasive vascular lab studies in our office today, which I have reviewed with the patient.  This does show reflux throughout her great saphenous vein bilaterally.  She has no reflux in her small saphenous and has near-normal deep system.  I discussed this at length with the patient . I felt she would be an excellent candidate for a staged bilateral ablation of her saphenous vein for relief of her venous hypertension and sclerotherapy of these areas of prominent telangiectasia.  She understands that this is not life or limb- threatening and does not make her more susceptible to deep venous thrombosis.  She wishes to continue observation only and use her compression.  We will see her again in 4 months for further discussion.    [name], M.D. Electronically Signed  TFE/MEDQ  D:  04/06/2011  T:  04/07/2011  Job:  [id number]

## 2011-04-14 ENCOUNTER — Other Ambulatory Visit: Payer: Self-pay | Admitting: Endocrinology

## 2011-04-14 MED ORDER — GLIMEPIRIDE 4 MG PO TABS: 4.0000 mg | ORAL_TABLET | ORAL | Status: DC

## 2011-04-14 NOTE — Telephone Encounter (Signed)
R'cd fax from [facility] for pt's Glimepiride   Last OV-01/31/2011  Last Filled-03/15/2011

## 2011-04-19 NOTE — Procedures (Unsigned)
LOWER EXTREMITY VENOUS REFLUX EXAM  INDICATION:  Chronic venous hypertension, superficial thrombophlebitis.  EXAM:  Using color-flow imaging and pulse Doppler spectral analysis, the bilateral common femoral, superficial femoral, popliteal, posterior tibial, greater and lesser saphenous veins are evaluated.  There is evidence suggesting focal deep venous reflux of >500 milliseconds noted in the left common femoral vein.  The bilateral saphenofemoral junctions and nontortuous GSV's demonstrate reflux of >552milliseconds.  The bilateral proximal small saphenous veins demonstrate competency.  GSV Diameter (used if found to be incompetent only)                                           Right    Left Proximal Greater Saphenous Vein           0.73 cm  0.52 cm Proximal-to-mid-thigh                     cm       cm Mid thigh                                 0.69 cm  0.47 cm Mid-distal thigh                          cm       cm Distal thigh                              0.67 cm  0.48 cm Knee                                      0.62 cm  0.54 cm  IMPRESSION: 1. Bilateral greater saphenous vein Reflux is noted, as described     above. 2. Focal deep venous reflux is noted, as described above. 3. The bilateral small saphenous veins are competent.  ___________________________________________ [name], M.D.  CH/MEDQ  D:  04/07/2011  T:  04/07/2011  Job:  [id number]

## 2011-04-20 ENCOUNTER — Other Ambulatory Visit: Payer: Self-pay | Admitting: Obstetrics and Gynecology

## 2011-04-20 DIAGNOSIS — Z139 Encounter for screening, unspecified: Secondary | ICD-10-CM

## 2011-04-28 ENCOUNTER — Ambulatory Visit (HOSPITAL_COMMUNITY)
Admission: RE | Admit: 2011-04-28 | Discharge: 2011-04-28 | Disposition: A | Discharge status: Home / Self Care | Payer: Medicare Other | Source: Ambulatory Visit | Attending: Obstetrics and Gynecology | Admitting: Obstetrics and Gynecology

## 2011-04-28 DIAGNOSIS — Z139 Encounter for screening, unspecified: Secondary | ICD-10-CM

## 2011-04-28 DIAGNOSIS — Z1231 Encounter for screening mammogram for malignant neoplasm of breast: Secondary | ICD-10-CM | POA: Insufficient documentation

## 2011-05-10 NOTE — Consult Note (Signed)
NEW PATIENT CONSULTATION   [name] [name], [name] [name]  DOB:  [birth year]                                       01/05/2011  ZOXWR#:[id number]   [name] [name] presents today for evaluation of bilateral severe venous  hypertension.  She is very pleasant 70 year old white female with a long  history of progressive swelling and petechiae over her swelling and  raised telangiectasia over both lower extremities.  She has marked  spider veins over her thighs bilaterally but over her lower leg, she has  raspberry like clusters of raised telangiectasia.  This is most  prominent over her lateral left ankle area and also over her lateral  right ankle area.  She had 1 episode of bleeding from one of these  clusters in her right ankle which was controlled with pressure.  She  does have pain with prolonged standing.  She has marked hemosiderin  deposits over her medial right calf and areas of chronic superficial  thrombophlebitis over this area as well.  She has no history of DVT.   PAST HISTORY:  Significant for controlled hypertension and non-insulin  dependent diabetes.   PAST SURGICAL HISTORY:  For reflux, hysterectomy and a pituitary tumor  and cholecystectomy.   SOCIAL HISTORY:  She is married with 3 children.  She does not smoke or  drink alcohol.   FAMILY HISTORY:  Negative for premature atherosclerotic disease.   REVIEW OF SYSTEMS:  Her weight is 183 pounds.  She is 5 feet 7 inches  tall.  She denies any weight loss or weight gain.  VASCULAR:  Pain with walking and lying flat in her legs.  CARDIAC:  Shortness breath with exertion.  GI:  Negative.  NEUROLOGIC:  History of headache.  PULMONARY:  Positive for asthma and wheezing.  HEMATOLOGIC:  Negative.  URINARY:  Negative.  ENT, musculoskeletal, psychiatric and skin are all negative.   PHYSICAL EXAMINATION:  Well-developed, well-nourished white female  appearing stated age in no acute distress.  Blood pressure is 130/80,  pulse 113, respirations 20.  She is in no acute distress.  HEENT:  Normal.  ABDOMEN:  Soft and nontender.  I do not palpate any masses.  Her radial and dorsalis pedis pulses are 2+ bilaterally.  MUSCULOSKELETAL:  Shows no major deformity or cyanosis.  NEUROLOGIC:  No focal weakness or paresthesias.  SKIN:  Without ulcers or rashes.  She does have scarring from healed  ulcerations over the medial ankle below the level of the medial  malleolus.  She does not have any varicose veins.  She does have marked  telangiectasia bilaterally.   She underwent screening ultrasound by myself with SonoSite and this  study showed reflux in her great saphenous vein bilaterally.   I discussed the significance of this with Ms. Macauley.  I explained that  I feel that she is at risk for continued bleeding from these very  prominent telangiectasia at the level of her ankle.  This is related to  the venous hypertension and she does have significant areas of venous  hypertension and stasis changes in her medial right calf.  We have  fitted her with graduated compression stockings to 20-30 mmHg knee-high  today and will see her again in 3 months to determine if she is having  improvement in her symptoms related to this.  She would be a candidate  for laser ablation and sclerotherapy of these large reticular veins to  reduce her risk for bleeding and to reduce her risk for progressive  changes of venous hypertension.  We will see her again 3 months for  continued discussion.     [name], M.D.  Electronically Signed   TFE/MEDQ  D:  01/05/2011  T:  01/06/2011  Job:  [id number]

## 2011-05-10 NOTE — Consult Note (Signed)
[name]                          ENDOCRINOLOGY CONSULTATION   NAME:[name], [name] HAUK                       MRN:          [id number]  DATE:06/07/2007                            DOB:          19-Dec-1941    REASON FOR VISIT:  Follow up diabetes.   HISTORY OF PRESENT ILLNESS:  This is a 70 year old woman who takes Actos  prescribed for type 2 diabetes which is well controlled.   She has a history of a pituitary adenoma removed by Dr. Danielle [name] some  years ago.  She has central hypothyroidism resulting from that.   She also asked that I recheck her lipids today.   SOCIAL HISTORY:  She has recently retired.  She is married.   REVIEW OF SYSTEMS:  She has a few pounds of weight gain, but she denies  chest pain.   PHYSICAL EXAMINATION:  VITAL SIGNS:  Blood pressure 136/72, heart rate  109, temperature 98.3, weight 186.  GENERAL:  No distress.  EXTREMITIES:  No edema.  Pedal pulses are intact.  There is no ulcer  present on the feet.  Feet have normal color and temperature, and  sensation intact to touch on the feet.   LABORATORY STUDIES:  BMET is normal except a random glucose of 136.  Cholesterol 160, LDL cholesterol 70, triglycerides 155 (this is an  afternoon lipid panel).  Urine microalbumin is negative.  Hemoglobin A1c  7.93. Free T4 7.5, TSH 1.91.   IMPRESSION:  1. She needs an increase in her diabetes medication.  2. Hypothyroidism, which is probably primary, but secondary      hypothyroidism can be excluded.  3. Well-controlled dyslipidemia.   PLAN:  1. Add metformin extended release 500 mg a day.  2. In about 3 months, check hemoglobin A1c, FSH, and LH.  If the [name] [name]      and LH are elevated even in the presence of hormone replacement      therapy, then her hypothyroidism should be deemed primary and not      due to her pituitary surgery.  3. Same amount of Zocor.  4. If these laboratory studies are okay as expected, she can return in   a year.     Sean A. Everardo All, MD  Electronically Signed    SAE/MedQ  DD: 06/10/2007  DT: 06/10/2007  Job #: [id number]   cc:   [name] [name], M.D.

## 2011-05-13 NOTE — Op Note (Signed)
NAME:  [name] White, [name] White                          ACCOUNT NO.:  [id number]   MEDICAL RECORD NO.:  [id number]                   PATIENT TYPE:  AMB   LOCATION:  DAY                                  FACILITY:  [facility]   PHYSICIAN:  Lionel December, M.D.                 DATE OF BIRTH:  November 18, 1941   DATE OF PROCEDURE:  11/18/2002  DATE OF DISCHARGE:                                 OPERATIVE REPORT   PROCEDURE:  Total colonoscopy, polypectomy.   INDICATIONS FOR PROCEDURE:  [name] is a 70 year old Caucasian female who was  recently noted to have heme positive stools by Dr. Emelda Fear. She also gives  a six month history of intermittent hematochezia usually with her bowels;  however, on a few occasions she has had blood drip out after having had a  BM. She denies abdominal pain or recent weight loss. The procedure risks  were reviewed with the patient and informed consent was obtained.   PREOP MEDICATIONS:  Demerol 50 mg IV, Versed 5 mg IV.   INSTRUMENT:  Olympus video system.   FINDINGS:  The procedure was performed in the endoscopy suite. The patient's  vital signs and O2 saturations were monitored during the procedure and  remained stable. The patient was placed in the left lateral decubitus  position, rectal examination performed. No abnormality noted on external or  digital exam. The scope was placed in the rectum and advanced under direct  vision to the sigmoid colon and beyond. Preparation was excellent. The scope  was passed in the cecum which was identified by the ileocecal valve and  appendiceal stump/orifice. As the scope was withdrawn, the mucosa was  carefully examined. It was normal throughout. Few diverticula were noted in  the sigmoid colon. The rectal mucosa, however, had a few small polyps and  these are oblated by via cold biopsy (4). On retroflexion, there was a  friable, fleshy, lobulated structure. I could not tell whether it was coming  from the anal canal or from the rectal  mucosa; however, on pinching the  biopsy, she did not have much pain and I therefore proceeded to snare it. It  was snared in two pieces and a little residual tissue was left which was  coagulated using a snare tip. Multiple pictures were taken which are part of  her database. The endoscope was straightened and withdrawn. The patient  tolerated the procedure well.   FINAL DIAGNOSES:  1. Examination performed to the cecum.  2. A few diverticula at the sigmoid colon.  3. Four small polyps oblated via cold biopsy from the rectum.  4. A lobulated friable fleshy structure at the anorectal junction which was     snared. This is most likely a benign process and hopefully cytology will     confirm that.   RECOMMENDATIONS:  1. Standard instructions given.  2.     Citrucel one tablespoonful daily.  3. Rowasa suppository one per rectum at bedtime for two weeks.  4. I will carotid endarterectomy contacting [facility] with biopsy results later     this week.                                               Lionel December, M.D.    NR/MEDQ  D:  11/18/2002  T:  11/18/2002  Job:  [id number]   cc:   Tilda Burrow, M.D.  [address] [place]  [place] [address]  Fax: [phone]   Oneal Deputy. Juanetta Gosling, M.D.  [address]  [place]  [place] [address]  Fax: [phone]

## 2011-08-11 ENCOUNTER — Encounter: Payer: Self-pay | Admitting: *Deleted

## 2011-08-11 ENCOUNTER — Other Ambulatory Visit: Payer: Self-pay | Admitting: Endocrinology

## 2011-08-11 HISTORY — PX: TRANSPHENOIDAL / TRANSNASAL HYPOPHYSECTOMY / RESECTION PITUITARY TUMOR: SUR1382

## 2011-08-11 HISTORY — PX: NM ESOPHAGEAL REFLUX: HXRAD613

## 2011-08-15 ENCOUNTER — Other Ambulatory Visit: Payer: Self-pay | Admitting: Endocrinology

## 2011-08-19 ENCOUNTER — Encounter: Payer: Self-pay | Admitting: *Deleted

## 2011-08-23 ENCOUNTER — Ambulatory Visit (INDEPENDENT_AMBULATORY_CARE_PROVIDER_SITE_OTHER): Payer: Medicare Other | Admitting: Endocrinology

## 2011-08-23 ENCOUNTER — Other Ambulatory Visit (INDEPENDENT_AMBULATORY_CARE_PROVIDER_SITE_OTHER): Payer: Medicare Other

## 2011-08-23 ENCOUNTER — Encounter: Payer: Self-pay | Admitting: Endocrinology

## 2011-08-23 VITALS — BP 118/76 | HR 115 | Temp 98.2°F | Ht 67.0 in | Wt 190.2 lb

## 2011-08-23 DIAGNOSIS — E119 Type 2 diabetes mellitus without complications: Secondary | ICD-10-CM

## 2011-08-23 LAB — HEMOGLOBIN A1C: Hgb A1c MFr Bld: 7.2 % — ABNORMAL HIGH (ref 4.6–6.5)

## 2011-08-23 NOTE — Patient Instructions (Addendum)
blood tests are being requested for you today.  please call [phone] to hear your test results.  You will be prompted to enter the 9-digit "MRN" number that appears at the top left of this page, followed by #.  Then you will hear the message. Based on the results, i may advise you to start victoza (increase every few days--increase to 1.8 mg daily). Please make a follow-up appointment in 3 months. (update: i left message on phone-tree:  Stop welchol and amaryl.   Change januvia to victoza as we discussed).

## 2011-08-23 NOTE — Progress Notes (Signed)
Subjective:    Patient ID: [name] White, female    DOB: [birth year], 70 y.o.   MRN: [id number]  HPI pt states she feels well in general, except for intermittent mild hypoglycemia.  no cbg record, but states cbg's are well-controlled, in general.   Past Medical History  Diagnosis Date  . Varicose veins   . 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 HYPOTHYROIDISM 09/14/2007  . HYPERCHOLESTEROLEMIA 02/09/2009  . DIABETES MELLITUS, TYPE II 09/14/2007  . MIGRAINE HEADACHE 09/14/2007  . HYPERTENSION 02/09/2009  . SUPERFICIAL PHLEBITIS 09/14/2007  . PANCREATITIS 09/14/2007  . OSTEOPOROSIS 02/09/2009  . ASYMPTOMATIC POSTMENOPAUSAL STATUS 02/09/2009    Past Surgical History  Procedure Date  . Abdominal hysterectomy   . Nm esophageal reflux 08-11-11  . Transphenoidal / transnasal hypophysectomy / resection pituitary tumor 08-11-11  . Cholecystectomy   . Pituitary excision 10/1997    History   Social History  . Marital Status: Married    Spouse Name: N/A    Number of Children: N/A  . Years of Education: N/A   Occupational History  . Retired    Social History Main Topics  . Smoking status: Never Smoker   . Smokeless tobacco: Not on file  . Alcohol Use: Not on file  . Drug Use: Not on file  . Sexually Active: Not on file   Other Topics Concern  . Not on file   Social History Narrative  . No narrative on file    Current Outpatient Prescriptions on File Prior to Visit  Medication Sig Dispense Refill  . ACTOS 45 MG tablet TAKE ONE (1) TABLET EACH DAY  30 tablet  5  . albuterol (PROVENTIL) (2.5 MG/3ML) 0.083% nebulizer solution Take 2.5 mg by nebulization QID.        Marland Kitchen beclomethasone (QVAR) 80 MCG/ACT inhaler Inhale 2 puffs into the lungs 2 (two) times daily.        Marland Kitchen estradiol (ESTRACE) 1 MG tablet Take 1 mg by mouth daily.        Marland Kitchen HYDROcodone-acetaminophen (VICODIN) 5-500 MG per tablet Take 1 tablet by mouth every 4 (four) hours as needed.        Marland Kitchen levothyroxine (SYNTHROID) 50 MCG tablet  Take 1 tablet (50 mcg total) by mouth daily.  30 tablet  11  . lisinopril (PRINIVIL,ZESTRIL) 20 MG tablet Take 10 mg by mouth daily.        . metFORMIN (GLUCOPHAGE-XR) 500 MG 24 hr tablet TAKE TWO TABLETS BY MOUTH DAILY  60 tablet  5  . omeprazole (PRILOSEC) 20 MG capsule Take 20 mg by mouth daily.        . pregabalin (LYRICA) 150 MG capsule Take 150 mg by mouth 2 (two) times daily.        . rizatriptan (MAXALT-MLT) 10 MG disintegrating tablet Take 10 mg by mouth. May repeat in 2 hours if needed      . simvastatin (ZOCOR) 80 MG tablet TAKE ONE (1) TABLET EACH DAY  30 tablet  5  . venlafaxine (EFFEXOR-XR) 150 MG 24 hr capsule Take 150 mg by mouth.          Allergies  Allergen Reactions  . Penicillins     Family History  Problem Relation Age of Onset  . Cancer Neg Hx     BP 118/76  Pulse 115  Temp(Src) 98.2 F (36.8 C) (Oral)  Ht 5\' 7"  (1.702 m)  Wt 190 lb 3.2 oz (86.274 kg)  BMI 29.79 kg/m2  SpO2 95%  Review of Systems She attributes intermittent heartburn to welchol.      Objective:   Physical Exam VITAL SIGNS:  See vs page GENERAL: no distress Pulses: dorsalis pedis intact bilat.   Feet: no deformity.  no ulcer on the feet.  feet are of normal color and temp.  no edema.  There is bilteral onychomycosis.  There are bilat varicosities Neuro: sensation is intact to touch on the feet.  Lab Results  Component Value Date   HGBA1C 7.2* 08/23/2011      Assessment & Plan:  Dm, Needs increased rx, if it can be done with a regimen that avoids or minimizes hypoglycemia. (i demonstrated victoza injection technique)

## 2011-09-06 ENCOUNTER — Other Ambulatory Visit: Payer: Self-pay

## 2011-09-06 ENCOUNTER — Encounter: Payer: Self-pay | Admitting: Vascular Surgery

## 2011-09-06 MED ORDER — LIRAGLUTIDE 18 MG/3ML ~~LOC~~ SOLN: 1.8000 mg | Freq: Every day | SUBCUTANEOUS | Status: DC

## 2011-09-07 ENCOUNTER — Ambulatory Visit (INDEPENDENT_AMBULATORY_CARE_PROVIDER_SITE_OTHER): Payer: Medicare Other | Admitting: Vascular Surgery

## 2011-09-07 ENCOUNTER — Encounter: Payer: Self-pay | Admitting: Vascular Surgery

## 2011-09-07 ENCOUNTER — Other Ambulatory Visit: Payer: Self-pay | Admitting: *Deleted

## 2011-09-07 VITALS — BP 126/69 | HR 69 | Resp 20 | Ht 67.0 in | Wt 183.0 lb

## 2011-09-07 DIAGNOSIS — I87309 Chronic venous hypertension (idiopathic) without complications of unspecified lower extremity: Secondary | ICD-10-CM

## 2011-09-07 MED ORDER — INSULIN PEN NEEDLE 31G X 5 MM MISC: Status: DC

## 2011-09-07 NOTE — Progress Notes (Signed)
Problems with Activities of Daily Living Secondary to Leg Pain  1. [name] White states that any activity which requires prolonged standing such as cooking and cleaning are extremely difficult due to leg pain.  2. [name] states she has had to discontinue walking for exercise due to leg pain. Rankin, [name] White   Failure of  Conservative Therapy:  1. Worn 20-30 mm Hg thigh high compression hose >3 months with no relief of symptoms.  2. Frequently elevates legs-no relief of symptoms  3. Taken Ibuprofen 600 Mg TID with no relief of symptoms.  The patient continues to have a bilateral leg pain despite compression garments elevation and ibuprofen. Fortunately so she's had no further bleeding. She has had documented increased pressure in both great saphenous vein this. She is very elevated telangiectasia which are tense and tender to her. She had had bleeding from these in the past on the right leg.  Past Medical History  Diagnosis Date  . Varicose veins   . 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 HYPOTHYROIDISM 09/14/2007  . HYPERCHOLESTEROLEMIA 02/09/2009  . DIABETES MELLITUS, TYPE II 09/14/2007  . MIGRAINE HEADACHE 09/14/2007  . HYPERTENSION 02/09/2009  . SUPERFICIAL PHLEBITIS 09/14/2007  . PANCREATITIS 09/14/2007  . OSTEOPOROSIS 02/09/2009  . ASYMPTOMATIC POSTMENOPAUSAL STATUS 02/09/2009    History  Substance Use Topics  . Smoking status: Never Smoker   . Smokeless tobacco: Never Used  . Alcohol Use: No    Family History  Problem Relation Age of Onset  . Cancer Neg Hx   . Heart disease Mother   . Asthma Mother   . COPD Father     Allergies  Allergen Reactions  . Penicillins     Current outpatient prescriptions:ACTOS 45 MG tablet, TAKE ONE (1) TABLET EACH DAY, Disp: 30 tablet, Rfl: 5;  albuterol (PROVENTIL) (2.5 MG/3ML) 0.083% nebulizer solution, Take 2.5 mg by nebulization QID.  , Disp: , Rfl: ;  beclomethasone (QVAR) 80 MCG/ACT inhaler, Inhale 2 puffs into the lungs 2 (two)  times daily.  , Disp: , Rfl: ;  estradiol (ESTRACE) 1 MG tablet, Take 1 mg by mouth daily.  , Disp: , Rfl:  HYDROcodone-acetaminophen (VICODIN) 5-500 MG per tablet, Take 1 tablet by mouth every 4 (four) hours as needed.  , Disp: , Rfl: ;  Insulin Pen Needle (B-D UF III MINI PEN NEEDLES) 31G X 5 MM MISC, Use as directed once daily, Disp: 100 each, Rfl: 1;  levothyroxine (SYNTHROID) 50 MCG tablet, Take 1 tablet (50 mcg total) by mouth daily., Disp: 30 tablet, Rfl: 11 Liraglutide (VICTOZA) 18 MG/3ML SOLN, Inject 0.3 mLs (1.8 mg total) into the skin daily., Disp: 6 mL, Rfl: 2;  lisinopril (PRINIVIL,ZESTRIL) 20 MG tablet, Take 10 mg by mouth daily. , Disp: , Rfl: ;  metFORMIN (GLUCOPHAGE-XR) 500 MG 24 hr tablet, TAKE TWO TABLETS BY MOUTH DAILY, Disp: 60 tablet, Rfl: 5;  omeprazole (PRILOSEC) 20 MG capsule, Take 20 mg by mouth daily.  , Disp: , Rfl:  pregabalin (LYRICA) 150 MG capsule, Take 150 mg by mouth 2 (two) times daily.  , Disp: , Rfl: ;  rizatriptan (MAXALT-MLT) 10 MG disintegrating tablet, Take 10 mg by mouth. May repeat in 2 hours if needed, Disp: , Rfl: ;  simvastatin (ZOCOR) 80 MG tablet, TAKE ONE (1) TABLET EACH DAY, Disp: 30 tablet, Rfl: 5;  venlafaxine (EFFEXOR-XR) 150 MG 24 hr capsule, Take 150 mg by mouth.  , Disp: , Rfl:   BP 126/69  Pulse 69  Resp 20  Ht 5\' 7"  (1.702 m)  Wt 183 lb (83.008 kg)  BMI 28.66 kg/m2  Body mass index is 28.66 kg/(m^2).        I discussed options with the patient. I feel that she has clearly failed conservative treatment. I have recommended that we proceed with staged bilateral laser ablation for relief of her symptoms. Also recommended sclerotherapy to the painful telangiectasia and the site of prior bleeding. She wished to proceed with this as soon as possible with the right leg first since this is most symptomatic.

## 2011-09-07 NOTE — Telephone Encounter (Signed)
R'cd fax from [facility] for rx for pen needles for Victoza pen  Last OV-08/23/2011

## 2011-09-22 ENCOUNTER — Other Ambulatory Visit: Payer: Self-pay | Admitting: *Deleted

## 2011-09-22 DIAGNOSIS — I83893 Varicose veins of bilateral lower extremities with other complications: Secondary | ICD-10-CM

## 2011-11-02 ENCOUNTER — Encounter: Payer: Self-pay | Admitting: Vascular Surgery

## 2011-11-03 ENCOUNTER — Encounter: Payer: Self-pay | Admitting: Vascular Surgery

## 2011-11-03 ENCOUNTER — Ambulatory Visit (INDEPENDENT_AMBULATORY_CARE_PROVIDER_SITE_OTHER): Payer: Medicare Other | Admitting: Vascular Surgery

## 2011-11-03 VITALS — BP 141/81 | HR 97 | Resp 18 | Ht 67.0 in | Wt 184.8 lb

## 2011-11-03 DIAGNOSIS — I83893 Varicose veins of bilateral lower extremities with other complications: Secondary | ICD-10-CM

## 2011-11-03 NOTE — Progress Notes (Signed)
Laser Ablation Procedure      Date: 11/03/2011    [name] White DOB:[birth year]  Consent signed: Yes  Surgeon:T.F. Alphonse Asbridge  Procedure: Laser Ablation: right Greater Saphenous Vein  BP 141/81  Pulse 97  Resp 18  Ht 5\' 7"  (1.702 m)  Wt 184 lb 12.8 oz (83.825 kg)  BMI 28.94 kg/m2  Start time: 11:00AM  End time: 12:00PM  Tumescent Anesthesia: 400 cc 0.9% NaCl with 50 cc Lidocaine HCL with 1% Epi and 15 cc 8.4% NaHCO3  Local Anesthesia: 2 cc Lidocaine HCL and NaHCO3 (ratio 2:1)  Continuous Mode: 15 watts Total Energy 2305 Joules Total Time2 :33   Sclerotherapy: 0.3 %Sotradecol. Patient received a total of 3 cc    Patient tolerated procedure well: Yes  Rankin, Neena Rhymes  Description of Procedure:  After marking the course of the saphenous vein and the secondary varicosities in the standing position, the patient was placed on the operating table in the supine position, and the right leg was prepped and draped in sterile fashion. Local anesthetic was administered, and under ultrasound guidance the saphenous vein was accessed with a micro needle and guide wire; then the micro puncture sheath was placed. A guide wire was inserted to the saphenofemoral junction, followed by a 5 french sheath.  The position of the sheath and then the laser fiber below the junction was confirmed using the ultrasound and visualization of the aiming beam.  Tumescent anesthesia was administered along the course of the saphenous vein using ultrasound guidance. Protective laser glasses were placed on the patient, and the laser was fired at at 15 watt continuous mode.  For a total of 2305 joules.  A steri strip was applied to the puncture site.    Sclerotherapy was performed to 5 perforator vessels using 3  cc .3% Sotradecol foam via a 27g butterfly needle.  ABD pads and thigh high compression stockings were applied.  Ace wrap bandages were applied over the phlebectomy sites and at the top of the saphenofemoral  junction.  Blood loss was less than 15 cc.  The patient ambulated out of the operating room having tolerated the procedure well.

## 2011-11-04 ENCOUNTER — Telehealth: Payer: Self-pay | Admitting: *Deleted

## 2011-11-04 NOTE — Telephone Encounter (Signed)
[name] White is complaining of moderate pain along right inner thigh.  No complaints of swelling or bleeding/oozing.  Encouraged [name] White to use ice compress to right inner thigh and to take Ibuprofen 600 mg po TID with food.  Reminded her of post procedural instructions and of follow up appointment with Dr. Arbie Cookey and venous duplex on 11-10-2011.  Naomi Castrogiovanni, Neena Rhymes

## 2011-11-09 ENCOUNTER — Encounter: Payer: Self-pay | Admitting: Vascular Surgery

## 2011-11-10 ENCOUNTER — Ambulatory Visit (INDEPENDENT_AMBULATORY_CARE_PROVIDER_SITE_OTHER): Payer: Medicare Other | Admitting: Vascular Surgery

## 2011-11-10 ENCOUNTER — Encounter: Payer: Self-pay | Admitting: Vascular Surgery

## 2011-11-10 VITALS — BP 113/69 | HR 106 | Resp 18 | Ht 67.0 in | Wt 190.7 lb

## 2011-11-10 DIAGNOSIS — M7989 Other specified soft tissue disorders: Secondary | ICD-10-CM

## 2011-11-10 DIAGNOSIS — I83893 Varicose veins of bilateral lower extremities with other complications: Secondary | ICD-10-CM

## 2011-11-10 DIAGNOSIS — M79609 Pain in unspecified limb: Secondary | ICD-10-CM

## 2011-11-10 DIAGNOSIS — Z48812 Encounter for surgical aftercare following surgery on the circulatory system: Secondary | ICD-10-CM

## 2011-11-10 NOTE — Progress Notes (Signed)
The patient presents today one week status post laser ablation of her right great saphenous vein. She had treatment from the level of her knee to just below the saphenofemoral junction. She also had sclerotherapy to telangiectasia around her right ankle that have bled in the past. She's had minimal discomfort related to the procedure. She has been compliant with her graduated compression stockings.  Physical exam mild bruising in good result initially and sclerotherapy of the tributary telangiectasia around the level of her right ankle.  Venous duplex. Successful ablation of her great saphenous vein with no indication of DVT.  Plan: Continued use of graduated compression garments for one additional week on the right she is scheduled for similar treatment of her left leg in 2 weeks

## 2011-11-10 NOTE — Progress Notes (Signed)
RLE venous post ablation study performed 11/10/2011 @[facility] 

## 2011-11-22 NOTE — Procedures (Unsigned)
DUPLEX DEEP VENOUS EXAM - LOWER EXTREMITY  INDICATION:  Right lower extremity varicose vein, pain, swelling.  HISTORY:  Edema:  Yes. Trauma/Surgery:  Right GSV EVLA, 11/03/2011. Pain:  Yes. PE:  No. Previous DVT:  No. Anticoagulants:  No. Other:  DUPLEX EXAM:               CFV   SFV   PopV  PTV    GSV               R  L  R  L  R  L  R   L  R  L Thrombosis    o  o  o     o     o      + Spontaneous   +  +  +     +     +      o Phasic        +  +  +     +     +      o Augmentation  +  +  +     +     +      o Compressible  +  +  +     +     +      o Competent     o  +  +     +     +  Legend:  + - yes  o - no  p - partial  D - decreased  IMPRESSION: 1. No evidence of deep venous thrombosis identified involving the     right lower extremity. 2. Good post-ablation results, the length of the greater saphenous     vein ablated without extension into the deep system at the     saphenofemoral junction. 3. Contralateral common femoral vein is patent and compressible.    _____________________________ [name], M.D.  SH/MEDQ  D:  11/10/2011  T:  11/10/2011  Job:  [id number]

## 2011-11-23 ENCOUNTER — Encounter: Payer: Self-pay | Admitting: Vascular Surgery

## 2011-11-24 ENCOUNTER — Ambulatory Visit (INDEPENDENT_AMBULATORY_CARE_PROVIDER_SITE_OTHER): Payer: Medicare Other | Admitting: Vascular Surgery

## 2011-11-24 ENCOUNTER — Ambulatory Visit: Payer: Medicare Other | Admitting: Endocrinology

## 2011-11-24 ENCOUNTER — Encounter: Payer: Self-pay | Admitting: Vascular Surgery

## 2011-11-24 VITALS — BP 116/65 | HR 77 | Resp 18 | Ht 67.0 in | Wt 183.2 lb

## 2011-11-24 DIAGNOSIS — I83893 Varicose veins of bilateral lower extremities with other complications: Secondary | ICD-10-CM

## 2011-11-24 NOTE — Progress Notes (Signed)
The patient presents today for laser ablation second leg of her great saphenous vein and sclerotherapy of telangiectasias at the level of her ankle. She had she had uneventful treatment under local anesthesia and will see [place] again in one week for duplex followup.

## 2011-11-24 NOTE — Progress Notes (Signed)
Laser Ablation Procedure      Date: 11/24/2011    [name] White DOB:1941/06/08  Consent signed: Yes  Surgeon:T.F. Early  Procedure: Laser Ablation: left Greater Saphenous Vein  BP 116/65  Pulse 77  Resp 18  Ht 5\' 7"  (1.702 m)  Wt 183 lb 3.2 oz (83.099 kg)  BMI 28.69 kg/m2  Start time: 10:50AM   End time: 11:55AM  Tumescent Anesthesia: 425 cc 0.9% NaCl with 50 cc Lidocaine HCL with 1% Epi and 15 cc 8.4% NaHCO3  Local Anesthesia: 3 cc Lidocaine HCL and NaHCO3 (ratio 2:1)  Continuous Mode: 15 Watts Total Energy 2555 Joules Total Time2:50   Sclerotherapy: 0.3 %Sotradecol. Patient received a total of 3 cc  Left ankle,foot, calf    Patient tolerated procedure well: Yes  Randee Huston, Neena Rhymes  Description of Procedure:  After marking the course of the saphenous vein and the secondary varicosities in the standing position, the patient was placed on the operating table in the supine position, and the left leg was prepped and draped in sterile fashion. Local anesthetic was administered, and under ultrasound guidance the saphenous vein was accessed with a micro needle and guide wire; then the micro puncture sheath was placed. A guide wire was inserted to the saphenofemoral junction, followed by a 5 french sheath.  The position of the sheath and then the laser fiber below the junction was confirmed using the ultrasound and visualization of the aiming beam.  Tumescent anesthesia was administered along the course of the saphenous vein using ultrasound guidance. Protective laser glasses were placed on the patient, and the laser was fired at at 15 watt continuous mode.  For a total of 2555 joules.  A steri strip was applied to the puncture site.    Sclerotherapy was performed to 4 perforator vessels using 3  cc .3% Sotradecol foam via a 30 gauge needle.  ABD pads and thigh high compression stockings were applied.  Ace wrap bandages were applied at the top of the saphenofemoral junction.   Blood loss was less than 15 cc.  The patient ambulated out of the operating room having tolerated the procedure well.

## 2011-11-25 ENCOUNTER — Encounter (HOSPITAL_COMMUNITY): Payer: Self-pay | Admitting: Pharmacy Technician

## 2011-11-25 NOTE — Patient Instructions (Addendum)
20 [name] White  11/25/2011   Your procedure is scheduled on:  12/05/2011  Report to [facility] at 06:15 AM.  Call this number if you have problems the morning of surgery: [phone]   Remember:   Do not eat food:After Midnight.  May have clear liquids:until Midnight .  Clear liquids include soda, tea, black coffee, apple or grape juice, broth.  Take these medicines the morning of surgery with A SIP OF WATER: Effexor, synthroid,maxalt,lyrica,lisinopril,omeprazole. Take albuterol and Qvar inhalers before you come.   Do not wear jewelry, make-up or nail polish.  Do not wear lotions, powders, or perfumes. You may wear deodorant.  Do not shave 48 hours prior to surgery.  Do not bring valuables to the hospital.  Contacts, dentures or bridgework may not be worn into surgery.  Leave suitcase in the car. After surgery it may be brought to your room.  For patients admitted to the hospital, checkout time is 11:00 AM the day of discharge.   Patients discharged the day of surgery will not be allowed to drive home.  Name and phone number of your driver:   Special Instructions: N/A   Please read over the following fact sheets that you were given: Anesthesia Post-op Instructions    Cataract A cataract is a clouding of the lens of the eye. It is most often related to aging. A cataract is not a "film" over the surface of the eye. The lens is inside the eye and changes size of the pupil. The lens can enlarge to let more light enter the eye in dark environments and contract the size of the pupil to let in bright light. The lens is the part of the eye that helps focus light on the retina. The retina is the eye's light-sensitive layer. It is in the back of the eye that sends visual signals to the brain. In a normal eye, light passes through the lens and gets focused on the retina. To help produce a sharp image, the lens must remain clear. When a lens becomes cloudy, vision is compromised by the degree  and nature of the clouding. Certain cataracts make people more near-sighted as they develop, others increase glare, and all reduce vision to some degree or another. A cataract that is so dense that it becomes milky white and a white opacity can be seen through the pupil. When the white color is seen, it is called a "mature" or "hyper-mature cataract." Such cataracts cause total blindness in the affected eye. The cataract must be removed to prevent damage to the eye itself. Some types of cataracts can cause a secondary disease of the eye, such as certain types of glaucoma. In the early stages, better lighting and eyeglasses may lessen vision problems caused by cataracts. At a certain point, surgery may be needed to improve vision. CAUSES   Aging. However, cataracts may occur at any age, even in newborns.   Certain drugs.   Trauma to the eye.   Certain diseases (such as diabetes).   Inherited or acquired medical syndromes.  SYMPTOMS   Gradual, progressive drop in vision in the affected eye. Cataracts may develop at different rates in each eye. Cataracts may even be in just one eye with the other unaffected.   Cataracts due to trauma may develop quickly, sometimes over a matter or days or even hours. The result is severe and rapid visual loss.  DIAGNOSIS  To detect a cataract, an eye doctor examines the lens. A well  developed cataract can be diagnosed without dilating the pupil. Early cataracts and others of a specific nature are best diagnosed with an exam of the eyes with the pupils dilated by drops. TREATMENT   For an early cataract, vision may improve by using different eyeglasses or stronger lighting.   If the above measures do not help, surgery is the only effective treatment. This treatment removes the cloudy lens and replaces it with a substitute lens (Intraocular lens, or IOL). Newly developed IOL technology allows the implanted lens to improve vision both at a distance and up close.  Discuss with your eye surgeon about the possibility of still needing glasses. Also discuss how visual coordination between both eyes will be affected.  A cataract needs to be removed only when vision loss interferes with your everyday activities such as driving, reading or watching TV. You and your eye doctor can make that decision together. In most cases, waiting until you are ready to have cataract surgery will not harm your eye. If you have cataracts in both eyes, only one should be removed at a time. This allows the operated eye to heal and be out of danger from serious problems (such as infection or poor wound healing) before having the other eye undergo surgery.  Sometimes, a cataract should be removed even if it does not cause problems with your vision. For example, a cataract should be removed if it prevents examination or treatment of another eye problem. Just as you cannot see out of the affected eye well, your doctor cannot see into your eye well through a cataract. The vast majority of people who have cataract surgery have better vision afterward. CATARACT REMOVAL There are two primary ways to remove a cataract. Your doctor can explain the differences and help determine which is best for you:  Phacoemulsification (small incision cataract surgery). This involves making a small cut (incision) on the edge of the clear, dome-shaped surface that covers the front of the eye (the cornea). An injection behind the eye or eye drops are given to make this a painless procedure. The doctor then inserts a tiny probe into the eye. This device emits ultrasound waves that soften and break up the cloudy center of the lens so it can be removed by suction. Most cataract surgery is done this way. The cuts are usually so small and performed in such a manner that often no sutures are needed to keep it closed.   Extracapsular surgery. Your doctor makes a slightly longer incision on the side of the cornea. The doctor  removes the hard center of the lens. The remainder of the lens is then removed by suction. In some cases, extremely fine sutures are needed which the doctor may, or may not remove in the office after the surgery.  When an IOL is implanted, it needs no care. It becomes a permanent part of your eye and cannot be seen or felt.  Some people cannot have an IOL. They may have problems during surgery, or maybe they have another eye disease. For these people, a soft contact lens may be suggested. If an IOL or contact lens cannot be used, very powerful and thick glasses are required after surgery. Since vision is very different through such thick glasses, it is important to have your doctor discuss the impact on your vision after any cataract surgery where there is no plan to implant an IOL. The normal lens of the eye is covered by a clear capsule. Both phacoemulsification and  extracapsular surgery require that the back surface of this lens capsule be left in place. This helps support IOLs and prevents the IOL from dislocating and falling back into the deeper interior of the eye. Right after surgery, and often permanently this "posterior capsule" remains clear. In some cases however, it can become cloudy, presenting the same type of visual compromise that the original cataract did since light is again obstructed as it passes through the clear IOL. This condition is often referred to as an "after-cataract." Fortunately, after-cataracts are easily treated using a painless and very fast laser treatment that is performed without anesthesia or incisions. It is done in a matter of minutes in an outpatient environment. Visual improvement is often immediate.  HOME CARE INSTRUCTIONS   Your surgeon will discuss pre and post operative care with you prior to surgery. The majority of people are able to do almost all normal activities right away. Although, it is often advised to avoid strenuous activity for a period of time.    Postoperative drops and careful avoidance of infection will be needed. Many surgeons suggest the use of a protective shield during the first few days after surgery.   There is a very small incidence of complication from modern cataract surgery, but it can happen. Infection that spreads to the inside of the eye (endophthalmitis) can result in total visual loss and even loss of the eye itself. In extremely rare instances, the inflammation of endophthalmitis can spread to both eyes (sympathetic ophthalmia). Appropriate post-operative care under the close observation of your surgeon is essential to a successful outcome.  SEEK IMMEDIATE MEDICAL CARE IF:   You have any sudden drop of vision in the operated eye.   You have pain in the operated eye.   You see a large number of floating dots in the field of vision in the operated eye.   You see flashing lights, or if a portion of your side vision in any direction appears black (like a curtain being drawn into your field of vision) in the operated eye.  Document Released: 12/12/2005 Document Revised: 08/24/2011 Document Reviewed: 01/28/2008 [facility] Patient Information 2012 [place], [place].      PATIENT INSTRUCTIONS POST-ANESTHESIA  IMMEDIATELY FOLLOWING SURGERY:  Do not drive or operate machinery for the first twenty four hours after surgery.  Do not make any important decisions for twenty four hours after surgery or while taking narcotic pain medications or sedatives.  If you develop intractable nausea and vomiting or a severe headache please notify your doctor immediately.  FOLLOW-UP:  Please make an appointment with your surgeon as instructed. You do not need to follow up with anesthesia unless specifically instructed to do so.  WOUND CARE INSTRUCTIONS (if applicable):  Keep a dry clean dressing on the anesthesia/puncture wound site if there is drainage.  Once the wound has quit draining you may leave it open to air.  Generally you should  leave the bandage intact for twenty four hours unless there is drainage.  If the epidural site drains for more than 36-48 hours please call the anesthesia department.  QUESTIONS?:  Please feel free to call your physician or the hospital operator if you have any questions, and they will be happy to assist you.     [facility] Anesthesia Department [address] [place] [phone]

## 2011-11-28 ENCOUNTER — Encounter (HOSPITAL_COMMUNITY): Payer: Self-pay

## 2011-11-28 ENCOUNTER — Other Ambulatory Visit: Payer: Self-pay

## 2011-11-28 ENCOUNTER — Telehealth: Payer: Self-pay | Admitting: *Deleted

## 2011-11-28 ENCOUNTER — Encounter (HOSPITAL_COMMUNITY)
Admission: RE | Admit: 2011-11-28 | Discharge: 2011-11-28 | Disposition: A | Discharge status: Home / Self Care | Payer: Medicare Other | Source: Ambulatory Visit | Attending: Ophthalmology | Admitting: Ophthalmology

## 2011-11-28 HISTORY — DX: Shortness of breath: R06.02

## 2011-11-28 HISTORY — DX: Polyneuropathy, unspecified: G62.9

## 2011-11-28 HISTORY — DX: Other specified postprocedural states: Z98.890

## 2011-11-28 HISTORY — DX: Other specified postprocedural states: R11.2

## 2011-11-28 HISTORY — DX: Nausea with vomiting, unspecified: R11.2

## 2011-11-28 LAB — CBC
HCT: 40 % (ref 36.0–46.0)
MCV: 94.6 fL (ref 78.0–100.0)
RBC: 4.23 MIL/uL (ref 3.87–5.11)
WBC: 10.7 10*3/uL — ABNORMAL HIGH (ref 4.0–10.5)

## 2011-11-28 LAB — BASIC METABOLIC PANEL
BUN: 12 mg/dL (ref 6–23)
CO2: 27 mEq/L (ref 19–32)
Chloride: 103 mEq/L (ref 96–112)
Creatinine, Ser: 0.64 mg/dL (ref 0.50–1.10)
Glucose, Bld: 152 mg/dL — ABNORMAL HIGH (ref 70–99)

## 2011-11-28 NOTE — Telephone Encounter (Signed)
11/28/2011  Time: 7:55 AM   Patient Name: [name] White  Patient of: T.F. Early  Procedure:Laser Ablation and Sclerotherapy left  11-24-2011  Reached patient at home and checked  Her status  Yes    Comments/Actions Taken: [name] has no complaints of swelling or bleeding.  She complained of moderate pain in left inner thigh near groin yesterday relieved by Ibuprofen 600 Mg.  No complaints of pain, bleeding or swelling today.  Reminded her to take Ibuprofen as directed on post procedural sheet and to use ice compress to left inner thigh prn pain. Reminded her of post op follow up with Dr. Arbie Cookey and venous duplex on 12-01-2011.     @SIGNATURE @[place] , Neena Rhymes

## 2011-11-30 ENCOUNTER — Encounter: Payer: Self-pay | Admitting: Vascular Surgery

## 2011-12-01 ENCOUNTER — Encounter: Payer: Self-pay | Admitting: Vascular Surgery

## 2011-12-01 ENCOUNTER — Ambulatory Visit (INDEPENDENT_AMBULATORY_CARE_PROVIDER_SITE_OTHER): Payer: Medicare Other | Admitting: Vascular Surgery

## 2011-12-01 VITALS — BP 139/76 | HR 89 | Resp 18 | Ht 67.0 in | Wt 181.0 lb

## 2011-12-01 DIAGNOSIS — M79609 Pain in unspecified limb: Secondary | ICD-10-CM

## 2011-12-01 DIAGNOSIS — I83893 Varicose veins of bilateral lower extremities with other complications: Secondary | ICD-10-CM

## 2011-12-01 NOTE — Progress Notes (Signed)
LLE venous duplex performed for post ablation on 12/01/2011 @ [facility]

## 2011-12-01 NOTE — Progress Notes (Signed)
The patient has today for followup of her laser ablation left great saphenous vein. She had some mild bruising but minimal discomfort and is returning to her usual activity.  Past Medical History  Diagnosis Date  . Varicose veins   . 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 HYPOTHYROIDISM 09/14/2007  . HYPERCHOLESTEROLEMIA 02/09/2009  . DIABETES MELLITUS, TYPE II 09/14/2007  . MIGRAINE HEADACHE 09/14/2007  . HYPERTENSION 02/09/2009  . SUPERFICIAL PHLEBITIS 09/14/2007  . PANCREATITIS 09/14/2007  . OSTEOPOROSIS 02/09/2009  . ASYMPTOMATIC POSTMENOPAUSAL STATUS 02/09/2009  . PONV (postoperative nausea and vomiting)   . Shortness of breath   . Asthma   . Neuropathy     History  Substance Use Topics  . Smoking status: Never Smoker   . Smokeless tobacco: Never Used  . Alcohol Use: No    Family History  Problem Relation Age of Onset  . Cancer Neg Hx   . Anesthesia problems Neg Hx   . Hypotension Neg Hx   . Malignant hyperthermia Neg Hx   . Pseudochol deficiency Neg Hx   . Heart disease Mother   . Asthma Mother   . COPD Father     Allergies  Allergen Reactions  . Betadine (Povidone Iodine) Hives  . Penicillins Rash    Current outpatient prescriptions:ACTOS 45 MG tablet, TAKE ONE (1) TABLET EACH DAY, Disp: 30 tablet, Rfl: 5;  albuterol (PROVENTIL HFA;VENTOLIN HFA) 108 (90 BASE) MCG/ACT inhaler, Inhale 2 puffs into the lungs every 4 (four) hours as needed.  , Disp: , Rfl: ;  beclomethasone (QVAR) 80 MCG/ACT inhaler, Inhale 2 puffs into the lungs 2 (two) times daily.  , Disp: , Rfl:  cycloSPORINE (RESTASIS) 0.05 % ophthalmic emulsion, Place 1 drop into both eyes 2 (two) times daily.  , Disp: , Rfl: ;  estradiol (ESTRACE) 1 MG tablet, Take 1 mg by mouth every morning. , Disp: , Rfl: ;  HYDROcodone-acetaminophen (VICODIN) 5-500 MG per tablet, Take 1 tablet by mouth every 4 (four) hours as needed. For pain, Disp: , Rfl:  Insulin Pen Needle (B-D UF III MINI PEN NEEDLES) 31G X 5 MM MISC, Use as directed  once daily, Disp: 100 each, Rfl: 1;  levothyroxine (SYNTHROID) 50 MCG tablet, Take 1 tablet (50 mcg total) by mouth daily., Disp: 30 tablet, Rfl: 11;  Liraglutide 18 MG/3ML SOLN, Inject 1.8 mg into the skin daily.  , Disp: , Rfl: ;  lisinopril (PRINIVIL,ZESTRIL) 5 MG tablet, Take 5 mg by mouth every morning.  , Disp: , Rfl:  metFORMIN (GLUCOPHAGE-XR) 500 MG 24 hr tablet,  , Disp: , Rfl: ;  Multiple Vitamins-Minerals (MULTIVITAMINS THER. W/MINERALS) TABS, Take 1 tablet by mouth every morning.  , Disp: , Rfl: ;  omeprazole (PRILOSEC) 20 MG capsule, Take 20 mg by mouth every morning. , Disp: , Rfl: ;  pregabalin (LYRICA) 150 MG capsule, Take 150 mg by mouth 2 (two) times daily. , Disp: , Rfl:  rizatriptan (MAXALT-MLT) 10 MG disintegrating tablet, Take 10 mg by mouth daily as needed. May repeat in 2 hours if needed, Disp: , Rfl: ;  simvastatin (ZOCOR) 80 MG tablet,  , Disp: , Rfl: ;  venlafaxine (EFFEXOR-XR) 150 MG 24 hr capsule, Take 150 mg by mouth every morning. , Disp: , Rfl:   BP 139/76  Pulse 89  Resp 18  Ht 5\' 7"  (1.702 m)  Wt 181 lb (82.101 kg)  BMI 28.35 kg/m2  Body mass index is 28.35 kg/(m^2).          Physical  exam: Mild bruising in the medial left thigh  Vascular lab: Ablation of her saphenous vein on the left from the insertion site to just below the saphenofemoral junction. No evidence of DVT.  Impression and plan successful ablation of left great saphenous vein. She started had treatment of her right great saphenous vein. She is pleased with her results and will see [place] again on an as-needed basis.

## 2011-12-05 ENCOUNTER — Encounter (HOSPITAL_COMMUNITY): Payer: Self-pay | Admitting: *Deleted

## 2011-12-05 ENCOUNTER — Encounter (HOSPITAL_COMMUNITY): Payer: Self-pay | Admitting: Anesthesiology

## 2011-12-05 ENCOUNTER — Encounter: Payer: Self-pay | Admitting: Vascular Surgery

## 2011-12-05 ENCOUNTER — Ambulatory Visit: Payer: Medicare Other | Admitting: Endocrinology

## 2011-12-05 ENCOUNTER — Encounter (HOSPITAL_COMMUNITY): Payer: Self-pay | Admitting: Ophthalmology

## 2011-12-05 ENCOUNTER — Encounter (HOSPITAL_COMMUNITY)
Admission: RE | Disposition: A | Discharge status: Home / Self Care | Payer: Self-pay | Source: Ambulatory Visit | Attending: Ophthalmology

## 2011-12-05 ENCOUNTER — Ambulatory Visit (HOSPITAL_COMMUNITY)
Admission: RE | Admit: 2011-12-05 | Discharge: 2011-12-05 | Disposition: A | Discharge status: Home / Self Care | Payer: Medicare Other | Source: Ambulatory Visit | Attending: Ophthalmology | Admitting: Ophthalmology

## 2011-12-05 ENCOUNTER — Ambulatory Visit (HOSPITAL_COMMUNITY): Payer: Medicare Other | Admitting: Anesthesiology

## 2011-12-05 DIAGNOSIS — Z79899 Other long term (current) drug therapy: Secondary | ICD-10-CM | POA: Insufficient documentation

## 2011-12-05 DIAGNOSIS — I10 Essential (primary) hypertension: Secondary | ICD-10-CM | POA: Insufficient documentation

## 2011-12-05 DIAGNOSIS — Z01812 Encounter for preprocedural laboratory examination: Secondary | ICD-10-CM | POA: Insufficient documentation

## 2011-12-05 DIAGNOSIS — E119 Type 2 diabetes mellitus without complications: Secondary | ICD-10-CM | POA: Insufficient documentation

## 2011-12-05 DIAGNOSIS — Z01810 Encounter for preprocedural cardiovascular examination: Secondary | ICD-10-CM | POA: Insufficient documentation

## 2011-12-05 DIAGNOSIS — H2589 Other age-related cataract: Secondary | ICD-10-CM | POA: Insufficient documentation

## 2011-12-05 HISTORY — PX: CATARACT EXTRACTION W/PHACO: SHX586

## 2011-12-05 SURGERY — PHACOEMULSIFICATION, CATARACT, WITH IOL INSERTION
Anesthesia: Monitor Anesthesia Care | Site: Eye | Laterality: Left | Wound class: Clean

## 2011-12-05 MED ORDER — LIDOCAINE HCL (PF) 1 % IJ SOLN
INTRAMUSCULAR | Status: AC
  Filled 2011-12-05: qty 2

## 2011-12-05 MED ORDER — TETRACAINE HCL 0.5 % OP SOLN
1.0000 [drp] | OPHTHALMIC | Status: AC
  Administered 2011-12-05 (×3): 1 [drp] via OPHTHALMIC

## 2011-12-05 MED ORDER — EPINEPHRINE HCL 1 MG/ML IJ SOLN
INTRAOCULAR | Status: DC | PRN
  Administered 2011-12-05: 08:00:00

## 2011-12-05 MED ORDER — EPINEPHRINE HCL 1 MG/ML IJ SOLN
INTRAMUSCULAR | Status: AC
  Filled 2011-12-05: qty 1

## 2011-12-05 MED ORDER — CYCLOPENTOLATE-PHENYLEPHRINE 0.2-1 % OP SOLN
OPHTHALMIC | Status: AC
  Administered 2011-12-05: 1 [drp] via OPHTHALMIC
  Filled 2011-12-05: qty 2

## 2011-12-05 MED ORDER — LIDOCAINE HCL 3.5 % OP GEL
OPHTHALMIC | Status: AC
  Administered 2011-12-05: 1 via OPHTHALMIC
  Filled 2011-12-05: qty 5

## 2011-12-05 MED ORDER — CYCLOPENTOLATE-PHENYLEPHRINE 0.2-1 % OP SOLN
1.0000 [drp] | OPHTHALMIC | Status: AC
  Administered 2011-12-05 (×3): 1 [drp] via OPHTHALMIC

## 2011-12-05 MED ORDER — LIDOCAINE HCL (PF) 1 % IJ SOLN
INTRAMUSCULAR | Status: DC | PRN
  Administered 2011-12-05: .6 mL

## 2011-12-05 MED ORDER — NEOMYCIN-POLYMYXIN-DEXAMETH 0.1 % OP OINT
TOPICAL_OINTMENT | OPHTHALMIC | Status: DC | PRN
  Administered 2011-12-05: 1 via OPHTHALMIC

## 2011-12-05 MED ORDER — ONDANSETRON HCL 4 MG/2ML IJ SOLN
4.0000 mg | Freq: Once | INTRAMUSCULAR | Status: AC
  Administered 2011-12-05: 4 mg via INTRAVENOUS

## 2011-12-05 MED ORDER — BSS IO SOLN
INTRAOCULAR | Status: DC | PRN
  Administered 2011-12-05: 15 mL via INTRAOCULAR

## 2011-12-05 MED ORDER — PHENYLEPHRINE HCL 2.5 % OP SOLN
OPHTHALMIC | Status: AC
  Administered 2011-12-05: 1 [drp] via OPHTHALMIC
  Filled 2011-12-05: qty 2

## 2011-12-05 MED ORDER — MIDAZOLAM HCL 2 MG/2ML IJ SOLN
INTRAMUSCULAR | Status: AC
  Administered 2011-12-05: 2 mg via INTRAVENOUS
  Filled 2011-12-05: qty 2

## 2011-12-05 MED ORDER — PHENYLEPHRINE HCL 2.5 % OP SOLN
1.0000 [drp] | OPHTHALMIC | Status: AC
  Administered 2011-12-05 (×3): 1 [drp] via OPHTHALMIC

## 2011-12-05 MED ORDER — PROVISC 10 MG/ML IO SOLN
INTRAOCULAR | Status: DC | PRN
  Administered 2011-12-05: 8.5 mg via INTRAOCULAR

## 2011-12-05 MED ORDER — LACTATED RINGERS IV SOLN
INTRAVENOUS | Status: DC
  Administered 2011-12-05: 1000 mL via INTRAVENOUS

## 2011-12-05 MED ORDER — TETRACAINE HCL 0.5 % OP SOLN
OPHTHALMIC | Status: AC
  Administered 2011-12-05: 1 [drp] via OPHTHALMIC
  Filled 2011-12-05: qty 2

## 2011-12-05 MED ORDER — NEOMYCIN-POLYMYXIN-DEXAMETH 3.5-10000-0.1 OP OINT
TOPICAL_OINTMENT | OPHTHALMIC | Status: AC
  Filled 2011-12-05: qty 3.5

## 2011-12-05 MED ORDER — ONDANSETRON HCL 4 MG/2ML IJ SOLN
INTRAMUSCULAR | Status: AC
  Administered 2011-12-05: 4 mg via INTRAVENOUS
  Filled 2011-12-05: qty 2

## 2011-12-05 MED ORDER — LIDOCAINE 3.5 % OP GEL OPTIME - NO CHARGE
OPHTHALMIC | Status: DC | PRN
  Administered 2011-12-05: 1 [drp] via OPHTHALMIC

## 2011-12-05 MED ORDER — MIDAZOLAM HCL 2 MG/2ML IJ SOLN
1.0000 mg | INTRAMUSCULAR | Status: DC | PRN
  Administered 2011-12-05: 2 mg via INTRAVENOUS

## 2011-12-05 MED ORDER — LIDOCAINE HCL 3.5 % OP GEL
1.0000 "application " | Freq: Once | OPHTHALMIC | Status: AC
  Administered 2011-12-05: 1 via OPHTHALMIC

## 2011-12-05 SURGICAL SUPPLY — 32 items

## 2011-12-05 NOTE — Brief Op Note (Signed)
Pre-Op Dx: Cataract OS Post-Op Dx: Cataract OS Surgeon: Willie Plain Anesthesia: Topical with MAC Implant: B&L, enVista Specimen: None Complications: None 

## 2011-12-05 NOTE — Anesthesia Postprocedure Evaluation (Signed)
  Anesthesia Post-op Note  Patient: [name] White  Procedure(s) Performed:  CATARACT EXTRACTION PHACO AND INTRAOCULAR LENS PLACEMENT (IOC) - CDE:9.65  Patient Location: PACU and Short Stay  Anesthesia Type: MAC  Level of Consciousness: awake, alert  and oriented  Airway and Oxygen Therapy: Patient Spontanous Breathing  Post-op Pain: none  Post-op Assessment: Post-op Vital signs reviewed, Patient's Cardiovascular Status Stable, Respiratory Function Stable and No signs of Nausea or vomiting  Post-op Vital Signs: Reviewed and stable  Complications: No apparent anesthesia complications

## 2011-12-05 NOTE — Transfer of Care (Signed)
Immediate Anesthesia Transfer of Care Note  Patient: [name] White  Procedure(s) Performed:  CATARACT EXTRACTION PHACO AND INTRAOCULAR LENS PLACEMENT (IOC) - CDE:9.65  Patient Location: PACU and Short Stay  Anesthesia Type: MAC  Level of Consciousness: awake, alert  and oriented  Airway & Oxygen Therapy: Patient Spontanous Breathing  Post-op Assessment: Report given to PACU RN  Post vital signs: Reviewed and stable  Complications: No apparent anesthesia complications

## 2011-12-05 NOTE — Anesthesia Preprocedure Evaluation (Signed)
Anesthesia Evaluation  Patient identified by MRN, date of birth, ID band Patient awake    Reviewed: Allergy & Precautions, H&P , NPO status , Patient's Chart, lab work & pertinent test results  History of Anesthesia Complications (+) PONV  Airway Mallampati: II      Dental  (+) Teeth Intact   Pulmonary shortness of breath and with exertion, asthma ,  clear to auscultation        Cardiovascular hypertension, Regular     Neuro/Psych  Headaches, PSYCHIATRIC DISORDERS Depression    GI/Hepatic   Endo/Other  Diabetes mellitus-, Well Controlled, Type 2, Oral Hypoglycemic AgentsHypothyroidism   Renal/GU      Musculoskeletal   Abdominal   Peds  Hematology   Anesthesia Other Findings   Reproductive/Obstetrics                           Anesthesia Physical Anesthesia Plan  ASA: III  Anesthesia Plan: MAC   Post-op Pain Management:    Induction:   Airway Management Planned:   Additional Equipment:   Intra-op Plan:   Post-operative Plan:   Informed Consent: I have reviewed the patients History and Physical, chart, labs and discussed the procedure including the risks, benefits and alternatives for the proposed anesthesia with the patient or authorized representative who has indicated his/her understanding and acceptance.     Plan Discussed with:   Anesthesia Plan Comments:         Anesthesia Quick Evaluation

## 2011-12-05 NOTE — H&P (Signed)
I have reviewed the H&P, the patient was re-examined, and I have identified no interval changes in medical condition and plan of care since the history and physical of record  

## 2011-12-05 NOTE — Op Note (Signed)
[name] White, [name] White NO.:  [id number]  MEDICAL RECORD NO.:  [id number]  LOCATION:  [facility]                          FACILITY:  [facility]  PHYSICIAN:  Susanne Greenhouse, MD       DATE OF BIRTH:  [birth year]  DATE OF PROCEDURE:  12/05/2011 DATE OF DISCHARGE:                              OPERATIVE REPORT   PREOPERATIVE DIAGNOSIS:  Combined cataract, left eye, diagnosis code 366.19.  POSTOPERATIVE DIAGNOSIS:  Combined cataract, left eye, diagnosis code 366.19.  OPERATION PERFORMED:  Phacoemulsification with posterior chamber intraocular lens implantation, left eye.  SURGEON:  Bonne Dolores. Mann Skaggs, MD  ANESTHESIA:  General endotracheal anesthesia.  OPERATIVE SUMMARY:  In the preoperative area, dilating drops were placed into the left eye.  The patient was then brought into the operating room where she was placed under general anesthesia.  The eye was then prepped and draped.  Beginning with a 75 blade, a paracentesis port was made at the surgeon's 2 o'clock position.  The anterior chamber was then filled with a 1% nonpreserved lidocaine solution with epinephrine.  This was followed by Viscoat to deepen the chamber.  A small fornix-based peritomy was performed superiorly.  Next, a single iris hook was placed through the limbus superiorly.  A 2.4-mm keratome blade was then used to make a clear corneal incision over the iris hook.  A bent cystotome needle and Utrata forceps were used to create a continuous tear capsulotomy.  Hydrodissection was performed using balanced salt solution on a fine cannula.  The lens nucleus was then removed using phacoemulsification in a quadrant cracking technique.  The cortical material was then removed with irrigation and aspiration.  The capsular bag and anterior chamber were refilled with Provisc.  The wound was widened to approximately 3 mm and a posterior chamber intraocular lens was placed into the capsular bag without difficulty using an  [employer] lens injecting system.  A single 10-0 nylon suture was then used to close the incision as well as stromal hydration.  The Provisc was removed from the anterior chamber and capsular bag with irrigation and aspiration.  At this point, the wounds were tested for leak, which were negative.  The anterior chamber remained deep and stable.  The patient tolerated the procedure well.  There were no operative complications, and she awoke from general anesthesia without problem.  No surgical specimens.  Prosthetic device used is a [employer]'s enVista posterior chamber lens, model MX60, power of 22.5, serial number is [id number].          ______________________________ Susanne Greenhouse, MD     KEH/MEDQ  D:  12/05/2011  T:  12/05/2011  Job:  [id number]

## 2011-12-08 ENCOUNTER — Encounter: Payer: Self-pay | Admitting: Endocrinology

## 2011-12-08 ENCOUNTER — Ambulatory Visit (INDEPENDENT_AMBULATORY_CARE_PROVIDER_SITE_OTHER): Payer: Medicare Other | Admitting: Endocrinology

## 2011-12-08 ENCOUNTER — Other Ambulatory Visit (INDEPENDENT_AMBULATORY_CARE_PROVIDER_SITE_OTHER): Payer: Medicare Other

## 2011-12-08 VITALS — BP 132/84 | HR 108 | Temp 97.7°F | Ht 67.0 in | Wt 183.8 lb

## 2011-12-08 DIAGNOSIS — E119 Type 2 diabetes mellitus without complications: Secondary | ICD-10-CM

## 2011-12-08 LAB — MICROALBUMIN / CREATININE URINE RATIO: Microalb Creat Ratio: 3.8 mg/g (ref 0.0–30.0)

## 2011-12-08 MED ORDER — COLESEVELAM HCL 625 MG PO TABS: 1875.0000 mg | ORAL_TABLET | Freq: Two times a day (BID) | ORAL | Status: DC

## 2011-12-08 NOTE — Patient Instructions (Addendum)
blood tests are being requested for you today.  please call [phone] to hear your test results.  You will be prompted to enter the 9-digit "MRN" number that appears at the top left of this page, followed by #.  Then you will hear the message. Please come back for a follow-up appointment in 4 months good diet and exercise habits significanly improve the control of your diabetes.  please let me know if you wish to be referred to a dietician.  high blood sugar is very risky to your health.  you should see an eye doctor every year. controlling your blood pressure and cholesterol drastically reduces the damage diabetes does to your body.  this also applies to quitting smoking.  please discuss these with your doctor.  you should take an aspirin every day, unless you have been advised by a doctor not to. check your blood sugar 1 time a day.  vary the time of day when you check, between before the 3 meals, and at bedtime.  also check if you have symptoms of your blood sugar being too high or too low.  please keep a record of the readings and bring it to your next appointment here.  please call us sooner if your blood sugar goes below 70, or if it stays over 200.  (update: i left message on phone-tree:  Add welchol)

## 2011-12-08 NOTE — Progress Notes (Signed)
Subjective:    Patient ID: [name], female    DOB: 1941/01/09, 70 y.o.   MRN: [id number]  HPI Pt returns for f/u of type 2 DM (1999).  pt states she feels well in general.  She does not mind the victoza injection.  no cbg record, but states cbg's are well-controlled.   Past Medical History  Diagnosis Date  . Varicose veins   . 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 HYPOTHYROIDISM 09/14/2007  . HYPERCHOLESTEROLEMIA 02/09/2009  . DIABETES MELLITUS, TYPE II 09/14/2007  . MIGRAINE HEADACHE 09/14/2007  . HYPERTENSION 02/09/2009  . SUPERFICIAL PHLEBITIS 09/14/2007  . PANCREATITIS 09/14/2007  . OSTEOPOROSIS 02/09/2009  . ASYMPTOMATIC POSTMENOPAUSAL STATUS 02/09/2009  . PONV (postoperative nausea and vomiting)   . Shortness of breath   . Asthma   . Neuropathy     Past Surgical History  Procedure Date  . Abdominal hysterectomy   . Nm esophageal reflux 08-11-11  . Transphenoidal / transnasal hypophysectomy / resection pituitary tumor 08-11-11  . Cholecystectomy   . Pituitary excision 10/1997  . Endovenous ablation saphenous vein w/ laser 11-03-2011   right greater saphenous vein    left leg done 10-2011  . Eye surgery 98    right cataract extraction 98    History   Social History  . Marital Status: Married    Spouse Name: N/A    Number of Children: N/A  . Years of Education: N/A   Occupational History  . Retired    Social History Main Topics  . Smoking status: Never Smoker   . Smokeless tobacco: Never Used  . Alcohol Use: No  . Drug Use: No  . Sexually Active: Yes    Birth Control/ Protection: Surgical   Other Topics Concern  . Not on file   Social History Narrative  . No narrative on file    Current Outpatient Prescriptions on File Prior to Visit  Medication Sig Dispense Refill  . ACTOS 45 MG tablet TAKE ONE (1) TABLET EACH DAY  30 tablet  5  . albuterol (PROVENTIL HFA;VENTOLIN HFA) 108 (90 BASE) MCG/ACT inhaler Inhale 2 puffs into the lungs every 4 (four) hours as  needed.        . beclomethasone (QVAR) 80 MCG/ACT inhaler Inhale 2 puffs into the lungs 2 (two) times daily.        . cycloSPORINE (RESTASIS) 0.05 % ophthalmic emulsion Place 1 drop into both eyes 2 (two) times daily.        Marland Kitchen estradiol (ESTRACE) 1 MG tablet Take 1 mg by mouth every morning.       Marland Kitchen HYDROcodone-acetaminophen (VICODIN) 5-500 MG per tablet Take 1 tablet by mouth every 4 (four) hours as needed. For pain      . Insulin Pen Needle (B-D UF III MINI PEN NEEDLES) 31G X 5 MM MISC Use as directed once daily  100 each  1  . levothyroxine (SYNTHROID) 50 MCG tablet Take 1 tablet (50 mcg total) by mouth daily.  30 tablet  11  . Liraglutide 18 MG/3ML SOLN Inject 1.8 mg into the skin daily.        Marland Kitchen lisinopril (PRINIVIL,ZESTRIL) 5 MG tablet Take 5 mg by mouth every morning.        . metFORMIN (GLUCOPHAGE-XR) 500 MG 24 hr tablet Take 1,000 mg by mouth daily with breakfast.       . Multiple Vitamins-Minerals (MULTIVITAMINS THER. W/MINERALS) TABS Take 1 tablet by mouth every morning.        Marland Kitchen  omeprazole (PRILOSEC) 20 MG capsule Take 20 mg by mouth every morning.       . pregabalin (LYRICA) 150 MG capsule Take 150 mg by mouth 2 (two) times daily.       . rizatriptan (MAXALT-MLT) 10 MG disintegrating tablet Take 10 mg by mouth daily as needed. May repeat in 2 hours if needed      . simvastatin (ZOCOR) 80 MG tablet        . venlafaxine (EFFEXOR-XR) 150 MG 24 hr capsule Take 150 mg by mouth every morning.         Allergies  Allergen Reactions  . Betadine (Povidone Iodine) Hives  . Penicillins Rash    Family History  Problem Relation Age of Onset  . Cancer Neg Hx   . Anesthesia problems Neg Hx   . Hypotension Neg Hx   . Malignant hyperthermia Neg Hx   . Pseudochol deficiency Neg Hx   . Heart disease Mother   . Asthma Mother   . COPD Father    BP 132/84  Pulse 108  Temp(Src) 97.7 F (36.5 C) (Oral)  Ht 5\' 7"  (1.702 m)  Wt 183 lb 12.8 oz (83.371 kg)  BMI 28.79 kg/m2  SpO2  96%  Review of Systems Denies weight change.    Objective:   Physical Exam VITAL SIGNS:  See vs page GENERAL: no distress SKIN:  Insulin injection sites at the anterior abdomen are normal, except for a few ecchymoses.   Lab Results  Component Value Date   HGBA1C 7.1* 12/08/2011      Assessment & Plan:  DM, Needs increased rx, if it can be done with a regimen that avoids or minimizes hypoglycemia.

## 2011-12-12 ENCOUNTER — Encounter (HOSPITAL_COMMUNITY): Payer: Self-pay | Admitting: Ophthalmology

## 2011-12-12 ENCOUNTER — Other Ambulatory Visit: Payer: Self-pay | Admitting: Endocrinology

## 2011-12-23 ENCOUNTER — Encounter: Payer: Self-pay | Admitting: Vascular Surgery

## 2012-01-18 NOTE — Procedures (Unsigned)
DUPLEX DEEP VENOUS EXAM - LOWER EXTREMITY  INDICATION:  Left lower extremity varicose veins, pain.  HISTORY:  Edema:  Yes. Trauma/Surgery:  Left endovenous laser ablation on 11/24/2011. Pain:  Yes. PE:  No. Previous DVT:  No. Anticoagulants:  No. Other:  DUPLEX EXAM:               CFV   SFV   PopV  PTV    GSV               R  L  R  L  R  L  R   L  R  L Thrombosis    o  o     o     o      o     o Spontaneous   +  +     +     +      +     + Phasic        +  +     +     +      +     + Augmentation  +  +     +     +      +     + Compressible  +  +     +     +      +     + Competent     +  +     +     +            +  Legend:  + - yes  o - no  p - partial  D - decreased  IMPRESSION: 1. No evidence of deep venous thrombosis identified involving the left     lower extremity. 2. Good post ablation results of the anterolateral branch of the left     lower extremity. 3. The left great saphenous vein is followed to proximal mid thigh,     where it becomes small in caliber with multiple branches present;     however, patent and competent at the saphenofemoral junction. 4. Patent and compressible contralateral common femoral veins present.   _____________________________ [name], M.D.  SH/MEDQ  D:  12/01/2011  T:  12/01/2011  Job:  [id number]

## 2012-02-02 ENCOUNTER — Other Ambulatory Visit: Payer: Self-pay | Admitting: Endocrinology

## 2012-03-07 ENCOUNTER — Other Ambulatory Visit: Payer: Self-pay | Admitting: Endocrinology

## 2012-03-14 ENCOUNTER — Other Ambulatory Visit: Payer: Self-pay | Admitting: Endocrinology

## 2012-04-09 ENCOUNTER — Other Ambulatory Visit: Payer: Self-pay | Admitting: Endocrinology

## 2012-04-19 ENCOUNTER — Encounter: Payer: Self-pay | Admitting: Endocrinology

## 2012-04-19 ENCOUNTER — Other Ambulatory Visit (INDEPENDENT_AMBULATORY_CARE_PROVIDER_SITE_OTHER): Payer: Medicare Other

## 2012-04-19 ENCOUNTER — Ambulatory Visit (INDEPENDENT_AMBULATORY_CARE_PROVIDER_SITE_OTHER): Payer: Medicare Other | Admitting: Endocrinology

## 2012-04-19 VITALS — BP 122/82 | HR 86 | Temp 97.7°F | Ht 67.0 in | Wt 178.5 lb

## 2012-04-19 DIAGNOSIS — E119 Type 2 diabetes mellitus without complications: Secondary | ICD-10-CM

## 2012-04-19 LAB — HEMOGLOBIN A1C: Hgb A1c MFr Bld: 6.5 % (ref 4.6–6.5)

## 2012-04-19 MED ORDER — PIOGLITAZONE HCL 45 MG PO TABS: 45.0000 mg | ORAL_TABLET | Freq: Every day | ORAL | Status: DC

## 2012-04-19 NOTE — Patient Instructions (Addendum)
blood tests are being requested for you today.  You will receive a letter with results.  check your blood sugar 1 time a day.  vary the time of day when you check, between before the 3 meals, and at bedtime.  also check if you have symptoms of your blood sugar being too high or too low.  please keep a record of the readings and bring it to your next appointment here.  please call us sooner if your blood sugar goes below 70, or if it stays over 200.   Please come back for a follow-up appointment in 6 months.

## 2012-04-19 NOTE — Progress Notes (Signed)
Subjective:    Patient ID: [name] White, female    DOB: 11-01-1941, 71 y.o.   MRN: [id number]  HPI Pt returns for f/u of type 2 DM (dx'ed 1999--no known complications).  pt states she feels well in general.  no cbg record, but states cbg's are well-controlled Past Medical History  Diagnosis Date  . Varicose veins   . 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 HYPOTHYROIDISM 09/14/2007  . HYPERCHOLESTEROLEMIA 02/09/2009  . DIABETES MELLITUS, TYPE II 09/14/2007  . MIGRAINE HEADACHE 09/14/2007  . HYPERTENSION 02/09/2009  . SUPERFICIAL PHLEBITIS 09/14/2007  . PANCREATITIS 09/14/2007  . OSTEOPOROSIS 02/09/2009  . ASYMPTOMATIC POSTMENOPAUSAL STATUS 02/09/2009  . PONV (postoperative nausea and vomiting)   . Shortness of breath   . Asthma   . Neuropathy     Past Surgical History  Procedure Date  . Abdominal hysterectomy   . Nm esophageal reflux 08-11-11  . Transphenoidal / transnasal hypophysectomy / resection pituitary tumor 08-11-11  . Cholecystectomy   . Pituitary excision 10/1997  . Endovenous ablation saphenous vein w/ laser 11-03-2011   right greater saphenous vein    left leg done 10-2011  . Eye surgery 98    right cataract extraction 98  . Cataract extraction w/phaco 12/05/2011    Procedure: CATARACT EXTRACTION PHACO AND INTRAOCULAR LENS PLACEMENT (IOC);  Surgeon: Gemma Payor;  Location: [facility];  Service: Ophthalmology;  Laterality: Left;  CDE:9.65    History   Social History  . Marital Status: Married    Spouse Name: N/A    Number of Children: N/A  . Years of Education: N/A   Occupational History  . Retired    Social History Main Topics  . Smoking status: Never Smoker   . Smokeless tobacco: Never Used  . Alcohol Use: No  . Drug Use: No  . Sexually Active: Yes    Birth Control/ Protection: Surgical   Other Topics Concern  . Not on file   Social History Narrative  . No narrative on file    Current Outpatient Prescriptions on File Prior to Visit  Medication Sig Dispense  Refill  . albuterol (PROVENTIL HFA;VENTOLIN HFA) 108 (90 BASE) MCG/ACT inhaler Inhale 2 puffs into the lungs every 4 (four) hours as needed.        . beclomethasone (QVAR) 80 MCG/ACT inhaler Inhale 2 puffs into the lungs 2 (two) times daily.        . colesevelam (WELCHOL) 625 MG tablet Take 3 tablets (1,875 mg total) by mouth 2 (two) times daily with a meal.  180 tablet  11  . cycloSPORINE (RESTASIS) 0.05 % ophthalmic emulsion Place 1 drop into both eyes 2 (two) times daily.        Marland Kitchen estradiol (ESTRACE) 1 MG tablet Take 1 mg by mouth every morning.       Marland Kitchen HYDROcodone-acetaminophen (VICODIN) 5-500 MG per tablet Take 1 tablet by mouth every 4 (four) hours as needed. For pain      . Insulin Pen Needle (B-D UF III MINI PEN NEEDLES) 31G X 5 MM MISC Use as directed once daily  100 each  1  . levothyroxine (SYNTHROID, LEVOTHROID) 50 MCG tablet TAKE ONE TABLET ONCE DAILY  30 tablet  8  . lisinopril (PRINIVIL,ZESTRIL) 5 MG tablet Take 5 mg by mouth every morning.        . metFORMIN (GLUCOPHAGE-XR) 500 MG 24 hr tablet TAKE TWO TABLETS BY MOUTH DAILY  60 tablet  5  . Multiple Vitamins-Minerals (MULTIVITAMINS THER. W/MINERALS) TABS Take  1 tablet by mouth every morning.        Marland Kitchen omeprazole (PRILOSEC) 20 MG capsule Take 20 mg by mouth every morning.       . pioglitazone (ACTOS) 45 MG tablet Take 1 tablet (45 mg total) by mouth daily.  90 tablet  3  . pregabalin (LYRICA) 150 MG capsule Take 150 mg by mouth 2 (two) times daily.       . rizatriptan (MAXALT-MLT) 10 MG disintegrating tablet Take 10 mg by mouth daily as needed. May repeat in 2 hours if needed      . simvastatin (ZOCOR) 80 MG tablet TAKE ONE (1) TABLET EACH DAY  30 tablet  8  . venlafaxine (EFFEXOR-XR) 150 MG 24 hr capsule Take 150 mg by mouth every morning.       Marland Kitchen VICTOZA 18 MG/3ML SOLN INJECT 0.3MLS (1.8MG ) INTO SKIN ONCE    DAILY.  9 mL  3    Allergies  Allergen Reactions  . Betadine (Povidone Iodine) Hives  . Penicillins Rash    Family  History  Problem Relation Age of Onset  . Cancer Neg Hx   . Anesthesia problems Neg Hx   . Hypotension Neg Hx   . Malignant hyperthermia Neg Hx   . Pseudochol deficiency Neg Hx   . Heart disease Mother   . Asthma Mother   . COPD Father     BP 122/82  Pulse 86  Temp(Src) 97.7 F (36.5 C) (Oral)  Ht 5\' 7"  (1.702 m)  Wt 178 lb 8 oz (80.967 kg)  BMI 27.96 kg/m2  SpO2 95%    Review of Systems denies hypoglycemia    Objective:   Physical Exam Pulses: dorsalis pedis intact bilat.   Feet: no deformity.  no ulcer on the feet.  feet are of normal color and temp.  no edema Neuro: sensation is intact to touch on the feet   Lab Results  Component Value Date   HGBA1C 6.5 04/19/2012      Assessment & Plan:  DM.  well-controlled

## 2012-04-20 ENCOUNTER — Telehealth: Payer: Self-pay | Admitting: *Deleted

## 2012-04-20 NOTE — Telephone Encounter (Signed)
Pt informed of lab results. 

## 2012-04-20 NOTE — Telephone Encounter (Signed)
Called pt to inform of lab results, left message for pt to callback office (letter also mailed to pt). 

## 2012-07-10 ENCOUNTER — Other Ambulatory Visit: Payer: Self-pay | Admitting: Endocrinology

## 2012-08-07 ENCOUNTER — Other Ambulatory Visit: Payer: Self-pay | Admitting: Endocrinology

## 2012-09-25 ENCOUNTER — Other Ambulatory Visit (HOSPITAL_COMMUNITY): Payer: Self-pay | Admitting: Pulmonary Disease

## 2012-09-25 DIAGNOSIS — IMO0001 Reserved for inherently not codable concepts without codable children: Secondary | ICD-10-CM

## 2012-09-26 ENCOUNTER — Other Ambulatory Visit: Payer: Self-pay | Admitting: Endocrinology

## 2012-10-02 ENCOUNTER — Ambulatory Visit (HOSPITAL_COMMUNITY)
Admission: RE | Admit: 2012-10-02 | Discharge: 2012-10-02 | Disposition: A | Discharge status: Home / Self Care | Payer: Medicare Other | Source: Ambulatory Visit | Attending: Pulmonary Disease | Admitting: Pulmonary Disease

## 2012-10-02 DIAGNOSIS — IMO0001 Reserved for inherently not codable concepts without codable children: Secondary | ICD-10-CM

## 2012-10-02 DIAGNOSIS — Z1231 Encounter for screening mammogram for malignant neoplasm of breast: Secondary | ICD-10-CM | POA: Insufficient documentation

## 2012-10-18 ENCOUNTER — Other Ambulatory Visit: Payer: Self-pay | Admitting: General Practice

## 2012-10-18 ENCOUNTER — Encounter: Payer: Self-pay | Admitting: Endocrinology

## 2012-10-18 ENCOUNTER — Ambulatory Visit (INDEPENDENT_AMBULATORY_CARE_PROVIDER_SITE_OTHER): Payer: Medicare Other | Admitting: Endocrinology

## 2012-10-18 VITALS — BP 118/74 | HR 94 | Temp 97.7°F | Resp 16 | Wt 179.1 lb

## 2012-10-18 DIAGNOSIS — Z23 Encounter for immunization: Secondary | ICD-10-CM

## 2012-10-18 DIAGNOSIS — E119 Type 2 diabetes mellitus without complications: Secondary | ICD-10-CM

## 2012-10-18 LAB — HEMOGLOBIN A1C
Hgb A1c MFr Bld: 6.4 % — ABNORMAL HIGH (ref <5.7)
Mean Plasma Glucose: 137 mg/dL — ABNORMAL HIGH (ref <117)

## 2012-10-18 MED ORDER — INSULIN PEN NEEDLE 32G X 5 MM MISC: Status: DC

## 2012-10-18 NOTE — Patient Instructions (Addendum)
blood tests are being requested for you today.  You will be contacted with results.   check your blood sugar 1 time a day.  vary the time of day when you check, between before the 3 meals, and at bedtime.  also check if you have symptoms of your blood sugar being too high or too low.  please keep a record of the readings and bring it to your next appointment here.  please call us sooner if your blood sugar goes below 70, or if it stays over 200.   Please come back for a follow-up appointment in 6 months.   

## 2012-10-18 NOTE — Progress Notes (Signed)
Subjective:    Patient ID: [name] White, female    DOB: 01-26-1941, 70 y.o.   MRN: [id number]  HPI Pt returns for f/u of type 2 DM (dx'ed 1999--no known complications; she is on 4 oral agents).  pt states she feels well in general.  no cbg record, but states cbg's are well-controlled.   Past Medical History  Diagnosis Date  . Varicose veins   . 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 HYPOTHYROIDISM 09/14/2007  . HYPERCHOLESTEROLEMIA 02/09/2009  . DIABETES MELLITUS, TYPE II 09/14/2007  . MIGRAINE HEADACHE 09/14/2007  . HYPERTENSION 02/09/2009  . SUPERFICIAL PHLEBITIS 09/14/2007  . PANCREATITIS 09/14/2007  . OSTEOPOROSIS 02/09/2009  . ASYMPTOMATIC POSTMENOPAUSAL STATUS 02/09/2009  . PONV (postoperative nausea and vomiting)   . Shortness of breath   . Asthma   . Neuropathy     Past Surgical History  Procedure Date  . Abdominal hysterectomy   . Nm esophageal reflux 08-11-11  . Transphenoidal / transnasal hypophysectomy / resection pituitary tumor 08-11-11  . Cholecystectomy   . Pituitary excision 10/1997  . Endovenous ablation saphenous vein w/ laser 11-03-2011   right greater saphenous vein    left leg done 10-2011  . Eye surgery 98    right cataract extraction 98  . Cataract extraction w/phaco 12/05/2011    Procedure: CATARACT EXTRACTION PHACO AND INTRAOCULAR LENS PLACEMENT (IOC);  Surgeon: Gemma Payor;  Location: [facility];  Service: Ophthalmology;  Laterality: Left;  CDE:9.65    History   Social History  . Marital Status: Married    Spouse Name: N/A    Number of Children: N/A  . Years of Education: N/A   Occupational History  . Retired    Social History Main Topics  . Smoking status: Never Smoker   . Smokeless tobacco: Never Used  . Alcohol Use: No  . Drug Use: No  . Sexually Active: Yes    Birth Control/ Protection: Surgical   Other Topics Concern  . Not on file   Social History Narrative  . No narrative on file    Current Outpatient Prescriptions on File Prior to Visit    Medication Sig Dispense Refill  . albuterol (PROVENTIL HFA;VENTOLIN HFA) 108 (90 BASE) MCG/ACT inhaler Inhale 2 puffs into the lungs every 4 (four) hours as needed.        . beclomethasone (QVAR) 80 MCG/ACT inhaler Inhale 2 puffs into the lungs 2 (two) times daily.        . colesevelam (WELCHOL) 625 MG tablet Take 3 tablets (1,875 mg total) by mouth 2 (two) times daily with a meal.  180 tablet  11  . cycloSPORINE (RESTASIS) 0.05 % ophthalmic emulsion Place 1 drop into both eyes 2 (two) times daily.        Marland Kitchen estradiol (ESTRACE) 1 MG tablet Take 1 mg by mouth every morning.       Marland Kitchen HYDROcodone-acetaminophen (VICODIN) 5-500 MG per tablet Take 1 tablet by mouth every 4 (four) hours as needed. For pain      . levothyroxine (SYNTHROID, LEVOTHROID) 50 MCG tablet TAKE ONE TABLET ONCE DAILY  30 tablet  8  . lisinopril (PRINIVIL,ZESTRIL) 5 MG tablet Take 5 mg by mouth every morning.        . metFORMIN (GLUCOPHAGE-XR) 500 MG 24 hr tablet TAKE TWO TABLETS BY MOUTH DAILY  60 tablet  5  . Multiple Vitamins-Minerals (MULTIVITAMINS THER. W/MINERALS) TABS Take 1 tablet by mouth every morning.        Marland Kitchen omeprazole (PRILOSEC) 20  MG capsule Take 20 mg by mouth every morning.       . pioglitazone (ACTOS) 45 MG tablet Take 1 tablet (45 mg total) by mouth daily.  90 tablet  3  . pregabalin (LYRICA) 150 MG capsule Take 150 mg by mouth 2 (two) times daily.       . rizatriptan (MAXALT-MLT) 10 MG disintegrating tablet Take 10 mg by mouth daily as needed. May repeat in 2 hours if needed      . simvastatin (ZOCOR) 80 MG tablet TAKE ONE (1) TABLET EACH DAY  30 tablet  8  . venlafaxine (EFFEXOR-XR) 150 MG 24 hr capsule Take 150 mg by mouth every morning.       Marland Kitchen VICTOZA 18 MG/3ML SOLN INJECT 0.3 MLS (1.8 MG) INTO SKIN ONCE  DAILY  9 mL  2    Allergies  Allergen Reactions  . Betadine (Povidone Iodine) Hives  . Penicillins Rash    Family History  Problem Relation Age of Onset  . Cancer Neg Hx   . Anesthesia problems  Neg Hx   . Hypotension Neg Hx   . Malignant hyperthermia Neg Hx   . Pseudochol deficiency Neg Hx   . Heart disease Mother   . Asthma Mother   . COPD Father     BP 118/74  Pulse 94  Temp 97.7 F (36.5 C) (Oral)  Resp 16  Wt 179 lb 1 oz (81.222 kg)  SpO2 94%  Review of Systems Denies weight change    Objective:   Physical Exam VITAL SIGNS:  See vs page GENERAL: no distress Pulses: dorsalis pedis intact bilat.   Feet: no deformity.  no ulcer on the feet.  feet are of normal color and temp.  no edema Neuro: sensation is intact to touch on the feet     Assessment & Plan:

## 2012-12-10 ENCOUNTER — Other Ambulatory Visit: Payer: Self-pay

## 2012-12-10 MED ORDER — LEVOTHYROXINE SODIUM 50 MCG PO TABS: 50.0000 ug | ORAL_TABLET | Freq: Every day | ORAL | Status: DC

## 2012-12-14 ENCOUNTER — Other Ambulatory Visit: Payer: Self-pay | Admitting: *Deleted

## 2012-12-14 MED ORDER — SIMVASTATIN 80 MG PO TABS: 80.0000 mg | ORAL_TABLET | Freq: Every day | ORAL | Status: DC

## 2012-12-21 ENCOUNTER — Encounter: Payer: Self-pay | Admitting: Endocrinology

## 2012-12-24 ENCOUNTER — Other Ambulatory Visit: Payer: Self-pay

## 2012-12-24 MED ORDER — LIRAGLUTIDE 18 MG/3ML ~~LOC~~ SOLN: 0.3000 mL | Freq: Once | SUBCUTANEOUS | Status: DC

## 2012-12-25 ENCOUNTER — Other Ambulatory Visit: Payer: Self-pay

## 2012-12-25 MED ORDER — LIRAGLUTIDE 18 MG/3ML ~~LOC~~ SOLN: 0.3000 mL | Freq: Once | SUBCUTANEOUS | Status: DC

## 2013-01-04 ENCOUNTER — Other Ambulatory Visit: Payer: Self-pay | Admitting: *Deleted

## 2013-01-04 MED ORDER — COLESEVELAM HCL 625 MG PO TABS: 1875.0000 mg | ORAL_TABLET | Freq: Two times a day (BID) | ORAL | Status: DC

## 2013-02-02 ENCOUNTER — Other Ambulatory Visit: Payer: Self-pay | Admitting: Endocrinology

## 2013-02-04 ENCOUNTER — Telehealth: Payer: Self-pay

## 2013-02-04 NOTE — Telephone Encounter (Signed)
Patient left message on office VM, needs RF - Actos. Pharmacy says they are waiting on approval. Pt. CB # [id number] / SRS

## 2013-02-10 ENCOUNTER — Other Ambulatory Visit: Payer: Self-pay

## 2013-02-28 ENCOUNTER — Other Ambulatory Visit: Payer: Self-pay

## 2013-02-28 MED ORDER — METFORMIN HCL ER 500 MG PO TB24: ORAL_TABLET | ORAL | Status: DC

## 2013-03-04 ENCOUNTER — Other Ambulatory Visit: Payer: Self-pay | Admitting: *Deleted

## 2013-03-04 ENCOUNTER — Encounter: Payer: Self-pay | Admitting: Endocrinology

## 2013-03-04 MED ORDER — METFORMIN HCL ER 500 MG PO TB24: ORAL_TABLET | ORAL | Status: DC

## 2013-03-04 NOTE — Telephone Encounter (Signed)
Rx sent to [facility]. Called pt and pt requested it to go to [facility].

## 2013-03-19 ENCOUNTER — Encounter: Payer: Self-pay | Admitting: Adult Health

## 2013-03-19 ENCOUNTER — Ambulatory Visit (INDEPENDENT_AMBULATORY_CARE_PROVIDER_SITE_OTHER): Payer: Medicare Other | Admitting: Adult Health

## 2013-03-19 VITALS — BP 110/60 | Ht 66.0 in | Wt 181.0 lb

## 2013-03-19 DIAGNOSIS — N939 Abnormal uterine and vaginal bleeding, unspecified: Secondary | ICD-10-CM

## 2013-03-19 DIAGNOSIS — N898 Other specified noninflammatory disorders of vagina: Secondary | ICD-10-CM

## 2013-03-19 DIAGNOSIS — R319 Hematuria, unspecified: Secondary | ICD-10-CM

## 2013-03-19 DIAGNOSIS — Z90710 Acquired absence of both cervix and uterus: Secondary | ICD-10-CM

## 2013-03-19 DIAGNOSIS — Z1212 Encounter for screening for malignant neoplasm of rectum: Secondary | ICD-10-CM

## 2013-03-19 DIAGNOSIS — K5900 Constipation, unspecified: Secondary | ICD-10-CM

## 2013-03-19 DIAGNOSIS — N816 Rectocele: Secondary | ICD-10-CM

## 2013-03-19 DIAGNOSIS — Z1389 Encounter for screening for other disorder: Secondary | ICD-10-CM

## 2013-03-19 HISTORY — DX: Constipation, unspecified: K59.00

## 2013-03-19 HISTORY — DX: Rectocele: N81.6

## 2013-03-19 LAB — POCT WET PREP (WET MOUNT)

## 2013-03-19 LAB — HEMOCCULT GUIAC POC 1CARD (OFFICE): Fecal Occult Blood, POC: NEGATIVE

## 2013-03-19 LAB — POCT URINALYSIS DIPSTICK
Bilirubin, UA: NEGATIVE
Glucose, UA: NEGATIVE
Nitrite, UA: NEGATIVE

## 2013-03-19 NOTE — Progress Notes (Signed)
Subjective:     Patient ID: [name] White, female   DOB: 06-24-1941, 72 y.o.   MRN: [id number]  HPI [name] White is a 72 year old white female married in today complaining of vaginal bleeding. She is status post hysterectomy, and is taking estrogen therapy. She had vaginal bleeding this week end after a bowel movement, she said she is constipated and has to strain. She does to a pressure. And she is seen no blood today.   Review of Systems Patient denies any headaches, blurred vision, shortness of breath, chest pain, abdominal pain, problems with urination, or intercourse, which is not often. She does have constipation and pressure. She noticed the bleeding this weekend, and it was enough to drip in the toilet. Reviewed past medical, surgical, social, and family history. Reviewed medication and allergies.     Objective:   Physical ExamVital signs: Blood pressure 110/60, weight 181lbs., height 5 feet 6 inches. Urine trace of blood, positive WBC. Wet prep was negative, Hemoccult negative from rectum. I did a hemoccult card on the vaginal secretions and it was positive. On pelvic exam external genitalia is normal in appearance for age, the vagina has decreased color, moisture and rugae,scant white discharge present. Cervix and uterus are absent no adnexal masses or tenderness noted. On rectal exam she has fairly good sphincter tone, she has a rectocele, no hemorrhoids.     Assessment:     Vaginal bleeding Hematuria Rectocele    Plan:     Send urine for UA C&S   Review medical explainer #4 Follow up with Dr. Emelda White as scheduled.

## 2013-03-19 NOTE — Patient Instructions (Addendum)
Keep appt. With Dr. Emelda Fear Thursday.

## 2013-03-20 LAB — URINALYSIS
Nitrite: NEGATIVE
Specific Gravity, Urine: 1.012 (ref 1.005–1.030)
Urobilinogen, UA: 0.2 mg/dL (ref 0.0–1.0)

## 2013-03-21 ENCOUNTER — Ambulatory Visit (INDEPENDENT_AMBULATORY_CARE_PROVIDER_SITE_OTHER): Payer: Medicare Other | Admitting: Obstetrics and Gynecology

## 2013-03-21 VITALS — BP 138/70 | Ht 66.0 in | Wt 180.8 lb

## 2013-03-21 DIAGNOSIS — Z90710 Acquired absence of both cervix and uterus: Secondary | ICD-10-CM

## 2013-03-21 DIAGNOSIS — N816 Rectocele: Secondary | ICD-10-CM

## 2013-03-21 DIAGNOSIS — N8111 Cystocele, midline: Secondary | ICD-10-CM

## 2013-03-21 LAB — URINE CULTURE
Colony Count: NO GROWTH
Organism ID, Bacteria: NO GROWTH

## 2013-03-21 NOTE — Progress Notes (Signed)
Subjective:     [name] White is a 72 y.o. female here for folllowup rectocele Current complaints: see last note.  Personal health questionnaire reviewed: no.  denies SUI or UI,  Gynecologic History No LMP recorded. Patient has had a hysterectomy. Contraception: status post hysterectomy Last Pap: . Results were:  Last mammogram: 2013 . Results were: normal  Obstetric History OB History   Grav Para Term Preterm Abortions TAB SAB Ect Mult Living                     Review of Systems Gastrointestinal: negative Genitourinary:negative, rectocele, soilage at times mucus.    Objective:    General appearance: alert, cooperative and no distress Abdomen: soft, non-tender; bowel sounds normal; no masses,  no organomegaly Pelvic: external genitalia normal, uterus surgically absent and small high cystocele, and large rectocele, with perineal defect felt to left of meidline   Perineal laxity, descends well below ischial tuberosity. Assessment:     cystocele, rectocele. Plan:    plan ant and post repair when schedule permits.

## 2013-03-21 NOTE — Patient Instructions (Signed)
Expect a followup from [facility] surgery

## 2013-03-22 ENCOUNTER — Encounter (HOSPITAL_COMMUNITY): Payer: Self-pay | Admitting: Pharmacy Technician

## 2013-03-22 ENCOUNTER — Encounter: Payer: Self-pay | Admitting: Adult Health

## 2013-03-25 ENCOUNTER — Other Ambulatory Visit: Payer: Self-pay | Admitting: *Deleted

## 2013-03-25 ENCOUNTER — Encounter: Payer: Self-pay | Admitting: Obstetrics and Gynecology

## 2013-03-25 MED ORDER — LIRAGLUTIDE 18 MG/3ML ~~LOC~~ SOLN: 1.8000 mg | Freq: Once | SUBCUTANEOUS | Status: DC

## 2013-03-26 NOTE — Patient Instructions (Addendum)
[name] White  03/26/2013   Your procedure is scheduled on:  04/02/13  Report to [facility] at 06:15 AM.  Call this number if you have problems the morning of surgery: [phone]   Remember:   Do not eat food or drink liquids after midnight.   Take these medicines the morning of surgery with A SIP OF WATER: Levothyroxine, Lisinopril, Omeprazole, Lyrica and Effexor. You may take your Hydrocodone if needed. Also, use your Albuterol and Qvar inhaler.   Do not wear jewelry, make-up or nail polish.  Do not wear lotions, powders, or perfumes.   Do not shave 48 hours prior to surgery. Men may shave face and neck.  Do not bring valuables to the hospital.  Contacts, dentures or bridgework may not be worn into surgery.  Leave suitcase in the car. After surgery it may be brought to your room.    Patients discharged the day of surgery will not be allowed to drive home.   Special Instructions: Shower using CHG 2 nights before surgery and the night before surgery.  If you shower the day of surgery use CHG.  Use special wash - you have one bottle of CHG for all showers.  You should use approximately 1/3 of the bottle for each shower.   Please read over the following fact sheets that you were given: Pain Booklet, MRSA Information, Surgical Site Infection Prevention, Anesthesia Post-op Instructions and Care and Recovery After Surgery    Cystocele Repair A cystocele is a bulging, drooping hernia or break (rupture) of bladder tissue into the birth canal (vagina). This bulging or rupture occurs on the top front wall of the vagina. CAUSES  Cystocele is associated with weakness of the top front wall of the vagina due to stretching and tearing of the ligaments and muscles in the area. This is often the result of:  Multiple childbirths.  Continuous heavy lifting.  Chronic cough from asthma, emphysema, or smoking.  Being overweight.  Changes from aging.  Previous surgery in the vaginal  area.  Menopause with loss of estrogen hormone and weakening of the ligaments and muscles around the bladder. SYMPTOMS   Uncontrolled loss of urine (incontinence) with cough, sneeze, or exercise.  Pelvic pressure.  Frequency or urgency to urinate because of inability to completely empty the bladder.  Bladder infections.  Needing to push on the upper vagina to help yourself pass urine. DIAGNOSIS  A cystocele can be diagnosed by doing a pelvic exam and observing the top of the vagina drooping or bulging into or out of the vagina. TREATMENT  Surgical options:  Cystocele repair is surgery that removes the hernia.  There are also different "sling" operations that may be used. Discuss the different types of surgeries to repair a cystocele with your caregiver. Your caregiver will decide what type of surgery will be best in your case. Nonsurgical options:  Kegel exercises. This helps strengthen and tighten the muscles and tissue in and around the bladder and vagina. This may help with mild cases of cystocele.  A pessary may help the cystocele. A pessary is a plastic or rubber device that lifts the bladder into place. A pessary must be fitted by a doctor.  Tampons or diaphragms that lift the bladder into place are sometimes helpful with a minor or small cystocele.  Estrogen may help with mild cases in menopausal and aging women. LET YOUR CAREGIVER KNOW ABOUT:   Allergies to food or medicine.  Medicines taken, including vitamins, herbs, eyedrops, over-the-counter  medicines, and creams.  Use of steroids (by mouth or creams).  Previous problems with anesthetics or numbing medicines.  History of bleeding problems or blood clots.  Previous surgery.  Other health problems, including diabetes and kidney problems.  Possibility of pregnancy, if this applies. RISKS AND COMPLICATIONS  All surgery is associated with risks.  There are risks with a general anesthesia. You should discuss  this with your caregiver.  With spinal or epidural anesthesia, there may be an area that is not numbed, and you could feel pain.  Headache could occur with a spinal or epidural anesthetic.  The catheter you will have after surgery may not work properly or may get blocked and need to be replaced.  Excessive bleeding.  Infection.  Injury to surrounding structures.  Recurrence of the cystocele.  Surgery may not get rid of your symptoms. BEFORE THE PROCEDURE   Do not take aspirin or blood thinners for 1 week prior to surgery, unless instructed otherwise.  Do not eat or drink anything after midnight the night before surgery.  Let your caregiver know if you develop a cold or other infectious problems prior to surgery.  If being admitted the day of surgery, you should be present 1 hour prior to your procedure or as directed by your caregiver.  Plan and arrange for help when you go home from the hospital.  If you smoke, do not smoke for at least 2 weeks before the surgery.  Do not drink any alcohol for 3 days before the surgery. PROCEDURE  You will be given an anesthetic to prevent you from feeling pain during surgery. This may be a general anesthetic that puts you to sleep, or a spinal or epidural anesthetic. You will be asleep or be numbed through the entire procedure. During cystocele repair, tissue is pulled from the sides and around the top of the vagina to lift up the hernia. This removes the hernia so that the top of the vagina does not fall into the opening of the vagina. AFTER THE PROCEDURE  After surgery, you will be taken to the recovery room where a nurse will take care of you, checking your breathing, blood pressure, pulse, and your progress. When your caregiver feels you are stable, you will be taken to your room. You will have a drainage tube (Foley catheter) that will drain your bladder for 2 to 7 days or longer, until your bladder is working properly. This catheter is  placed prior to surgery to help keep your bladder empty and out of the way during the procedure. After surgery, this will make passing your urine easier. The catheter will be removed when you can easily pass urine without this assistance. You may have gauze packing in the vagina that will be removed 1 to 2 days after the surgery. Usually, you will be given a medicine (antibiotic) that kills germs. You will be given pain medicine as needed. You can usually go home in 3 to 5 days. HOME CARE INSTRUCTIONS   Do not take baths. Take showers until your caregiver informs you otherwise.  Take antibiotics as directed by your caregiver.  Exercise as instructed. Do not perform exercises which increase the pressure inside your belly (abdomen), such as sit-ups or lifting weights, until your caregiver has given permission. Walking exercise is preferred.  Only take over-the-counter or prescription medicines for pain and discomfort as directed by your caregiver.  Do not drink alcohol while taking pain medicine.  Do not lift anything over 5  pounds.  Do not drive until your caregiver gives you permission.  Get plenty of rest and sleep.  Have someone help with your household chores for 1 to 2 weeks.  If you develop constipation, you may take a mild laxative with your caregiver's permission. Eating bran foods and drinking enough water and fluids to keep your urine clear or pale yellow helps with constipation.  Do not take aspirin. It may cause bleeding.  You may resume normal diet and unstrenuous activities as directed.  Do not douche, use tampons, or engage in intercourse until your surgeon has given permission.  Change bandages (dressings) as directed.  Make and keep all your postoperative appointments. SEEK MEDICAL CARE IF:   You have abnormal vaginal discharge.  You develop a rash.  You are having a reaction to your medicine.  You develop nausea or vomiting. SEEK IMMEDIATE MEDICAL CARE IF:    You have redness, swelling, or increasing pain in the vaginal area.  You notice pus coming from the vagina.  You have a fever.  You notice a bad smell coming from the vagina.  You have increasing abdominal pain.  You have frequent urination or you notice burning during urination.  You notice blood in your urine.  You have excessive vaginal bleeding.  You cannot urinate. MAKE SURE YOU:   Understand these instructions.  Will watch your condition.  Will get help right away if you are not doing well or get worse. Document Released: 12/09/2000 Document Revised: 03/05/2012 Document Reviewed: 03/11/2010 [facility] Patient Information 2013 [place], [place].    PATIENT INSTRUCTIONS POST-ANESTHESIA  IMMEDIATELY FOLLOWING SURGERY:  Do not drive or operate machinery for the first twenty four hours after surgery.  Do not make any important decisions for twenty four hours after surgery or while taking narcotic pain medications or sedatives.  If you develop intractable nausea and vomiting or a severe headache please notify your doctor immediately.  FOLLOW-UP:  Please make an appointment with your surgeon as instructed. You do not need to follow up with anesthesia unless specifically instructed to do so.  WOUND CARE INSTRUCTIONS (if applicable):  Keep a dry clean dressing on the anesthesia/puncture wound site if there is drainage.  Once the wound has quit draining you may leave it open to air.  Generally you should leave the bandage intact for twenty four hours unless there is drainage.  If the epidural site drains for more than 36-48 hours please call the anesthesia department.  QUESTIONS?:  Please feel free to call your physician or the hospital operator if you have any questions, and they will be happy to assist you.

## 2013-03-27 ENCOUNTER — Encounter (HOSPITAL_COMMUNITY): Payer: Self-pay

## 2013-03-27 ENCOUNTER — Encounter (HOSPITAL_COMMUNITY)
Admission: RE | Admit: 2013-03-27 | Discharge: 2013-03-27 | Disposition: A | Discharge status: Home / Self Care | Payer: Medicare Other | Source: Ambulatory Visit | Attending: Obstetrics and Gynecology | Admitting: Obstetrics and Gynecology

## 2013-03-27 ENCOUNTER — Other Ambulatory Visit: Payer: Self-pay | Admitting: Obstetrics and Gynecology

## 2013-03-27 LAB — CBC
Hemoglobin: 14.5 g/dL (ref 12.0–15.0)
MCH: 31.5 pg (ref 26.0–34.0)
RBC: 4.6 MIL/uL (ref 3.87–5.11)
WBC: 9.1 10*3/uL (ref 4.0–10.5)

## 2013-03-27 LAB — URINALYSIS, ROUTINE W REFLEX MICROSCOPIC
Bilirubin Urine: NEGATIVE
Nitrite: NEGATIVE
Specific Gravity, Urine: <1.005 — ABNORMAL LOW (ref 1.005–1.030)
Urobilinogen, UA: 0.2 mg/dL (ref 0.0–1.0)

## 2013-03-27 LAB — RPR: RPR Ser Ql: NONREACTIVE

## 2013-03-27 LAB — URINE MICROSCOPIC-ADD ON

## 2013-03-27 LAB — SURGICAL PCR SCREEN: MRSA, PCR: NEGATIVE

## 2013-03-27 LAB — COMPREHENSIVE METABOLIC PANEL
Alkaline Phosphatase: 34 U/L — ABNORMAL LOW (ref 39–117)
BUN: 13 mg/dL (ref 6–23)
Calcium: 9.7 mg/dL (ref 8.4–10.5)
Creatinine, Ser: 0.75 mg/dL (ref 0.50–1.10)
GFR calc Af Amer: >90 mL/min (ref >90)
Glucose, Bld: 99 mg/dL (ref 70–99)
Total Protein: 6.9 g/dL (ref 6.0–8.3)

## 2013-03-27 MED ORDER — MAGNESIUM CITRATE PO SOLN: 300.0000 mL | Freq: Once | ORAL | Status: DC

## 2013-04-01 NOTE — H&P (Signed)
[name] White is an 72 y.o. female. She is admitted for anterior and posterior repair  due to a large symptomatic rectocele and small cystocele. She is status post hysterectomy. She denies urinary incontinence. She has difficulty with defecation, chronically using stool softeners. She's ha had a prior posterior repair in 1988 with gradual deterioration of the repair over time. Patient is aware that there is potential for scarring that makes the procedure more difficult. Preop exam was notable for if more significant thickening on the right side but raising the question of the site-specific defect being on the left side plans are to attempt to identify a good tissue on the right side pull it to the left and cephalad to eliminate the posterior defect. Bowel prep has been ordered  Pertinent Gynecological History: Menses: Status post hysterectomy Bleeding: None Contraception:  DES exposure:  Blood transfusions:  Sexually transmitted diseases: no past history Previous GYN Procedures: Hysterectomy in the 1970s, posterior repair 1988  Last mammogram: normal Date: 2013 Last pap: Not required, cervix, it is removed Date:  OB History: G22, P2   Menstrual History: Menarche age:  No LMP recorded. Patient has had a hysterectomy.    Past Medical History  Diagnosis Date  . Varicose veins   . 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 HYPOTHYROIDISM 09/14/2007  . HYPERCHOLESTEROLEMIA 02/09/2009  . DIABETES MELLITUS, TYPE II 09/14/2007  . MIGRAINE HEADACHE 09/14/2007  . HYPERTENSION 02/09/2009  . SUPERFICIAL PHLEBITIS 09/14/2007  . PANCREATITIS 09/14/2007  . OSTEOPOROSIS 02/09/2009  . ASYMPTOMATIC POSTMENOPAUSAL STATUS 02/09/2009  . PONV (postoperative nausea and vomiting)   . Shortness of breath   . Asthma   . Neuropathy   . Rectocele 03/19/2013  . Constipation 03/19/2013    Past Surgical History  Procedure Laterality Date  . Abdominal hysterectomy    . Nm esophageal reflux  08-11-11  . Transphenoidal /  transnasal hypophysectomy / resection pituitary tumor  08-11-11  . Cholecystectomy    . Pituitary excision  10/1997  . Endovenous ablation saphenous vein w/ laser  11-03-2011   right greater saphenous vein    left leg done 10-2011  . Eye surgery  98    right cataract extraction 98  . Cataract extraction w/phaco  12/05/2011    Procedure: CATARACT EXTRACTION PHACO AND INTRAOCULAR LENS PLACEMENT (IOC);  Surgeon: Gemma Payor;  Location: [facility];  Service: Ophthalmology;  Laterality: Left;  CDE:9.65  . Posterior repair    . Cystoscopy    . Laparoscopic nissen fundoplication      Family History  Problem Relation Age of Onset  . Cancer Neg Hx   . Anesthesia problems Neg Hx   . Hypotension Neg Hx   . Malignant hyperthermia Neg Hx   . Pseudochol deficiency Neg Hx   . Heart disease Mother   . Asthma Mother   . COPD Father   . Heart disease Father   . Asthma Daughter   . Migraines Daughter   . Neuropathy Son     Social History:  reports that she has never smoked. She has never used smokeless tobacco. She reports that she does not drink alcohol or use illicit drugs.  Allergies:  Allergies  Allergen Reactions  . Betadine (Povidone Iodine) Hives  . Penicillins Rash    No prescriptions prior to admission    ROS  There were no vitals taken for this visit. Physical Exam PLAN: Physical Examination: General appearance - alert, well appearing, and in no distress, oriented to person, place, and time, overweight,  playful, active, well hydrated and anxious Mental status - alert, oriented to person, place, and time, normal mood, behavior, speech, dress, motor activity, and thought processes Neck - supple, no significant adenopathy Chest - clear to auscultation, no wheezes, rales or rhonchi, symmetric air entry Heart - normal rate and regular rhythm Abdomen - soft, nontender, nondistended, no masses or organomegaly Pelvic - VULVA: normal appearing vulva with no masses, tenderness or lesions,  VAGINA: normal appearing vagina with normal color and discharge, no lesions, atrophic, PELVIC FLOOR EXAM: rectocele large to greater than 90 with suspected defect on the left side , cystocele small at upper in vagina, ADNEXA: normal adnexa in size, nontender and no masses, no palpable internal organs,   No results found for this or any previous visit (from the past 24 hour(s)). CBC    Component Value Date/Time   WBC 9.1 03/27/2013 0900   RBC 4.60 03/27/2013 0900   HGB 14.5 03/27/2013 0900   HCT 43.5 03/27/2013 0900   PLT 245 03/27/2013 0900   MCV 94.6 03/27/2013 0900   MCH 31.5 03/27/2013 0900   MCHC 33.3 03/27/2013 0900   RDW 13.8 03/27/2013 0900   MONOABS 1.3* 02/09/2009 1509   EOSABS 0.6 02/09/2009 1509   BASOSABS 0.0 02/09/2009 1509     No results found.  Assessment/Plan: Large symptomatic rectocele small cystocele scheduled for anterior and posterior repair on 04/02/2013  Doc Mandala V 04/01/2013, 6:34 PM

## 2013-04-02 ENCOUNTER — Encounter (HOSPITAL_COMMUNITY): Payer: Self-pay | Admitting: *Deleted

## 2013-04-02 ENCOUNTER — Ambulatory Visit (HOSPITAL_COMMUNITY): Payer: Medicare Other | Admitting: Anesthesiology

## 2013-04-02 ENCOUNTER — Encounter (HOSPITAL_COMMUNITY)
Admission: RE | Disposition: A | Discharge status: Home / Self Care | Payer: Self-pay | Source: Ambulatory Visit | Attending: Obstetrics and Gynecology

## 2013-04-02 ENCOUNTER — Observation Stay (HOSPITAL_COMMUNITY)
Admission: RE | Admit: 2013-04-02 | Discharge: 2013-04-03 | Disposition: A | Discharge status: Home / Self Care | Payer: Medicare Other | Source: Ambulatory Visit | Attending: Obstetrics and Gynecology | Admitting: Obstetrics and Gynecology

## 2013-04-02 ENCOUNTER — Encounter (HOSPITAL_COMMUNITY): Payer: Self-pay | Admitting: Anesthesiology

## 2013-04-02 DIAGNOSIS — N8111 Cystocele, midline: Secondary | ICD-10-CM | POA: Insufficient documentation

## 2013-04-02 DIAGNOSIS — Z01810 Encounter for preprocedural cardiovascular examination: Secondary | ICD-10-CM | POA: Insufficient documentation

## 2013-04-02 DIAGNOSIS — N814 Uterovaginal prolapse, unspecified: Principal | ICD-10-CM | POA: Insufficient documentation

## 2013-04-02 DIAGNOSIS — K5900 Constipation, unspecified: Secondary | ICD-10-CM

## 2013-04-02 DIAGNOSIS — E119 Type 2 diabetes mellitus without complications: Secondary | ICD-10-CM | POA: Insufficient documentation

## 2013-04-02 DIAGNOSIS — Z01812 Encounter for preprocedural laboratory examination: Secondary | ICD-10-CM | POA: Insufficient documentation

## 2013-04-02 DIAGNOSIS — I10 Essential (primary) hypertension: Secondary | ICD-10-CM | POA: Insufficient documentation

## 2013-04-02 DIAGNOSIS — N816 Rectocele: Secondary | ICD-10-CM

## 2013-04-02 HISTORY — PX: ANTERIOR AND POSTERIOR REPAIR: SHX5121

## 2013-04-02 LAB — CBC
HCT: 40.1 % (ref 36.0–46.0)
Hemoglobin: 13.3 g/dL (ref 12.0–15.0)
MCHC: 33.2 g/dL (ref 30.0–36.0)
RDW: 13.8 % (ref 11.5–15.5)
WBC: 8.1 10*3/uL (ref 4.0–10.5)

## 2013-04-02 LAB — CREATININE, SERUM
Creatinine, Ser: 0.62 mg/dL (ref 0.50–1.10)
GFR calc Af Amer: >90 mL/min (ref >90)
GFR calc non Af Amer: 89 mL/min — ABNORMAL LOW (ref >90)

## 2013-04-02 LAB — GLUCOSE, CAPILLARY: Glucose-Capillary: 127 mg/dL — ABNORMAL HIGH (ref 70–99)

## 2013-04-02 SURGERY — ANTERIOR (CYSTOCELE) AND POSTERIOR REPAIR (RECTOCELE)
Anesthesia: Spinal | Wound class: Clean Contaminated

## 2013-04-02 MED ORDER — SCOPOLAMINE 1 MG/3DAYS TD PT72
MEDICATED_PATCH | TRANSDERMAL | Status: AC
  Filled 2013-04-02: qty 1

## 2013-04-02 MED ORDER — FENTANYL CITRATE 0.05 MG/ML IJ SOLN
INTRAMUSCULAR | Status: AC
  Filled 2013-04-02: qty 2

## 2013-04-02 MED ORDER — BUPIVACAINE IN DEXTROSE 0.75-8.25 % IT SOLN
INTRATHECAL | Status: DC | PRN
  Administered 2013-04-02: 15 mg via INTRATHECAL

## 2013-04-02 MED ORDER — COLESEVELAM HCL 625 MG PO TABS
1875.0000 mg | ORAL_TABLET | Freq: Two times a day (BID) | ORAL | Status: DC
  Administered 2013-04-02 – 2013-04-03 (×2): 1875 mg via ORAL
  Filled 2013-04-02 (×6): qty 3

## 2013-04-02 MED ORDER — INSULIN ASPART 100 UNIT/ML ~~LOC~~ SOLN
4.0000 [IU] | Freq: Three times a day (TID) | SUBCUTANEOUS | Status: DC
  Administered 2013-04-02 (×2): 4 [IU] via SUBCUTANEOUS

## 2013-04-02 MED ORDER — 0.9 % SODIUM CHLORIDE (POUR BTL) OPTIME
TOPICAL | Status: DC | PRN
  Administered 2013-04-02: 1000 mL

## 2013-04-02 MED ORDER — FENTANYL CITRATE 0.05 MG/ML IJ SOLN
25.0000 ug | INTRAMUSCULAR | Status: DC | PRN
  Administered 2013-04-02: 25 ug via INTRAVENOUS

## 2013-04-02 MED ORDER — MIDAZOLAM HCL 2 MG/2ML IJ SOLN
1.0000 mg | INTRAMUSCULAR | Status: DC | PRN
  Administered 2013-04-02: 2 mg via INTRAVENOUS

## 2013-04-02 MED ORDER — LIDOCAINE HCL (CARDIAC) 10 MG/ML IV SOLN
INTRAVENOUS | Status: DC | PRN
  Administered 2013-04-02: 50 mg via INTRAVENOUS

## 2013-04-02 MED ORDER — VENLAFAXINE HCL ER 75 MG PO CP24
150.0000 mg | ORAL_CAPSULE | ORAL | Status: DC
  Administered 2013-04-03: 150 mg via ORAL
  Filled 2013-04-02: qty 2

## 2013-04-02 MED ORDER — ONDANSETRON HCL 4 MG/2ML IJ SOLN
INTRAMUSCULAR | Status: AC
  Filled 2013-04-02: qty 2

## 2013-04-02 MED ORDER — BUPIVACAINE-EPINEPHRINE 0.5% -1:200000 IJ SOLN
INTRAMUSCULAR | Status: DC | PRN
  Administered 2013-04-02: 10 mL
  Administered 2013-04-02: 14 mL

## 2013-04-02 MED ORDER — DEXAMETHASONE SODIUM PHOSPHATE 4 MG/ML IJ SOLN
INTRAMUSCULAR | Status: AC
  Filled 2013-04-02: qty 1

## 2013-04-02 MED ORDER — LISINOPRIL 5 MG PO TABS
5.0000 mg | ORAL_TABLET | ORAL | Status: DC
  Administered 2013-04-03: 5 mg via ORAL
  Filled 2013-04-02 (×2): qty 1

## 2013-04-02 MED ORDER — FENTANYL CITRATE 0.05 MG/ML IJ SOLN
INTRAMUSCULAR | Status: DC | PRN
  Administered 2013-04-02: 25 ug via INTRATHECAL
  Administered 2013-04-02 (×3): 25 ug via INTRAVENOUS

## 2013-04-02 MED ORDER — INSULIN ASPART 100 UNIT/ML ~~LOC~~ SOLN: 0.0000 [IU] | Freq: Every day | SUBCUTANEOUS | Status: DC

## 2013-04-02 MED ORDER — ONDANSETRON HCL 4 MG/2ML IJ SOLN
4.0000 mg | Freq: Once | INTRAMUSCULAR | Status: AC
  Administered 2013-04-02: 4 mg via INTRAVENOUS

## 2013-04-02 MED ORDER — OXYCODONE-ACETAMINOPHEN 5-325 MG PO TABS
1.0000 | ORAL_TABLET | Freq: Four times a day (QID) | ORAL | Status: DC | PRN
Start: 2013-04-02 — End: 2013-04-03
  Administered 2013-04-02 – 2013-04-03 (×2): 1 via ORAL
  Filled 2013-04-02 (×2): qty 1

## 2013-04-02 MED ORDER — MIDAZOLAM HCL 2 MG/2ML IJ SOLN
INTRAMUSCULAR | Status: AC
  Filled 2013-04-02: qty 2

## 2013-04-02 MED ORDER — PROPOFOL 10 MG/ML IV EMUL
INTRAVENOUS | Status: AC
  Filled 2013-04-02: qty 20

## 2013-04-02 MED ORDER — PROPOFOL INFUSION 10 MG/ML OPTIME
INTRAVENOUS | Status: DC | PRN
  Administered 2013-04-02: 50 ug/kg/min via INTRAVENOUS

## 2013-04-02 MED ORDER — ONDANSETRON HCL 4 MG/2ML IJ SOLN: 4.0000 mg | Freq: Once | INTRAMUSCULAR | Status: DC | PRN

## 2013-04-02 MED ORDER — FLUTICASONE PROPIONATE HFA 44 MCG/ACT IN AERO
2.0000 | INHALATION_SPRAY | Freq: Two times a day (BID) | RESPIRATORY_TRACT | Status: DC
  Administered 2013-04-02 – 2013-04-03 (×2): 2 via RESPIRATORY_TRACT
  Filled 2013-04-02: qty 10.6

## 2013-04-02 MED ORDER — ENOXAPARIN SODIUM 40 MG/0.4ML ~~LOC~~ SOLN
40.0000 mg | SUBCUTANEOUS | Status: DC
  Administered 2013-04-03: 40 mg via SUBCUTANEOUS
  Filled 2013-04-02: qty 0.4

## 2013-04-02 MED ORDER — DEXAMETHASONE SODIUM PHOSPHATE 4 MG/ML IJ SOLN
4.0000 mg | Freq: Once | INTRAMUSCULAR | Status: AC
  Administered 2013-04-02: 4 mg via INTRAVENOUS

## 2013-04-02 MED ORDER — CEFAZOLIN SODIUM-DEXTROSE 2-3 GM-% IV SOLR
INTRAVENOUS | Status: AC
  Filled 2013-04-02: qty 50

## 2013-04-02 MED ORDER — ALUM & MAG HYDROXIDE-SIMETH 200-200-20 MG/5ML PO SUSP
30.0000 mL | Freq: Once | ORAL | Status: AC
  Administered 2013-04-02: 30 mL via ORAL
  Filled 2013-04-02: qty 30

## 2013-04-02 MED ORDER — INSULIN ASPART 100 UNIT/ML ~~LOC~~ SOLN
0.0000 [IU] | Freq: Three times a day (TID) | SUBCUTANEOUS | Status: DC
  Administered 2013-04-02: 5 [IU] via SUBCUTANEOUS
  Administered 2013-04-02: 2 [IU] via SUBCUTANEOUS

## 2013-04-02 MED ORDER — EPHEDRINE SULFATE 50 MG/ML IJ SOLN
INTRAMUSCULAR | Status: DC | PRN
  Administered 2013-04-02: 5 mg via INTRAVENOUS
  Administered 2013-04-02: 10 mg via INTRAVENOUS

## 2013-04-02 MED ORDER — LACTATED RINGERS IV SOLN: INTRAVENOUS | Status: DC

## 2013-04-02 MED ORDER — ARTIFICIAL TEARS OP OINT
TOPICAL_OINTMENT | OPHTHALMIC | Status: AC
  Filled 2013-04-02: qty 3.5

## 2013-04-02 MED ORDER — FENTANYL CITRATE 0.05 MG/ML IJ SOLN: 25.0000 ug | INTRAMUSCULAR | Status: DC | PRN

## 2013-04-02 MED ORDER — ALBUTEROL SULFATE HFA 108 (90 BASE) MCG/ACT IN AERS: 2.0000 | INHALATION_SPRAY | RESPIRATORY_TRACT | Status: DC | PRN

## 2013-04-02 MED ORDER — PREGABALIN 75 MG PO CAPS
150.0000 mg | ORAL_CAPSULE | Freq: Two times a day (BID) | ORAL | Status: DC
  Administered 2013-04-02 – 2013-04-03 (×2): 150 mg via ORAL
  Filled 2013-04-02 (×2): qty 2

## 2013-04-02 MED ORDER — LEVOTHYROXINE SODIUM 50 MCG PO TABS
50.0000 ug | ORAL_TABLET | Freq: Every day | ORAL | Status: DC
  Administered 2013-04-03: 50 ug via ORAL
  Filled 2013-04-02: qty 1

## 2013-04-02 MED ORDER — BUPIVACAINE IN DEXTROSE 0.75-8.25 % IT SOLN
INTRATHECAL | Status: AC
  Filled 2013-04-02: qty 2

## 2013-04-02 MED ORDER — BUPIVACAINE-EPINEPHRINE PF 0.5-1:200000 % IJ SOLN
INTRAMUSCULAR | Status: AC
  Filled 2013-04-02: qty 10

## 2013-04-02 MED ORDER — EPHEDRINE SULFATE 50 MG/ML IJ SOLN
INTRAMUSCULAR | Status: AC
  Filled 2013-04-02: qty 1

## 2013-04-02 MED ORDER — MIDAZOLAM HCL 5 MG/5ML IJ SOLN
INTRAMUSCULAR | Status: DC | PRN
  Administered 2013-04-02 (×2): 1 mg via INTRAVENOUS

## 2013-04-02 MED ORDER — KETOROLAC TROMETHAMINE 30 MG/ML IJ SOLN
30.0000 mg | Freq: Once | INTRAMUSCULAR | Status: AC
Start: 2013-04-02 — End: 2013-04-02

## 2013-04-02 MED ORDER — CEFAZOLIN SODIUM-DEXTROSE 2-3 GM-% IV SOLR
INTRAVENOUS | Status: DC | PRN
  Administered 2013-04-02: 2 g via INTRAVENOUS

## 2013-04-02 MED ORDER — PHENYLEPHRINE HCL 10 MG/ML IJ SOLN
INTRAMUSCULAR | Status: AC
  Filled 2013-04-02: qty 1

## 2013-04-02 MED ORDER — SODIUM CHLORIDE 0.9 % IV SOLN
INTRAVENOUS | Status: DC
  Administered 2013-04-02: 12:00:00 via INTRAVENOUS

## 2013-04-02 MED ORDER — LIDOCAINE HCL (PF) 1 % IJ SOLN
INTRAMUSCULAR | Status: AC
  Filled 2013-04-02: qty 5

## 2013-04-02 MED ORDER — STERILE WATER FOR IRRIGATION IR SOLN
Status: DC | PRN
  Administered 2013-04-02: 1000 mL

## 2013-04-02 MED ORDER — LACTATED RINGERS IV SOLN
INTRAVENOUS | Status: DC | PRN
  Administered 2013-04-02 (×2): via INTRAVENOUS

## 2013-04-02 MED ORDER — LACTATED RINGERS IV SOLN
INTRAVENOUS | Status: DC
  Administered 2013-04-02: 07:00:00 via INTRAVENOUS

## 2013-04-02 MED ORDER — METFORMIN HCL ER 500 MG PO TB24
500.0000 mg | ORAL_TABLET | Freq: Two times a day (BID) | ORAL | Status: DC
  Administered 2013-04-02 – 2013-04-03 (×2): 500 mg via ORAL
  Filled 2013-04-02 (×6): qty 1

## 2013-04-02 MED ORDER — HYDROMORPHONE HCL PF 1 MG/ML IJ SOLN
1.0000 mg | INTRAMUSCULAR | Status: DC | PRN
  Administered 2013-04-03: 1 mg via INTRAVENOUS
  Filled 2013-04-02: qty 1

## 2013-04-02 MED ORDER — PANTOPRAZOLE SODIUM 40 MG PO TBEC
40.0000 mg | DELAYED_RELEASE_TABLET | Freq: Every day | ORAL | Status: DC
  Administered 2013-04-03: 40 mg via ORAL
  Filled 2013-04-02: qty 1

## 2013-04-02 MED ORDER — PHENYLEPHRINE HCL 10 MG/ML IJ SOLN
INTRAMUSCULAR | Status: DC | PRN
  Administered 2013-04-02 (×4): 100 ug via INTRAVENOUS

## 2013-04-02 MED ORDER — SCOPOLAMINE 1 MG/3DAYS TD PT72
1.0000 | MEDICATED_PATCH | Freq: Once | TRANSDERMAL | Status: DC
  Administered 2013-04-02: 1.5 mg via TRANSDERMAL

## 2013-04-02 MED ORDER — PRENATAL MULTIVITAMIN CH
1.0000 | ORAL_TABLET | Freq: Every day | ORAL | Status: DC
  Administered 2013-04-03: 1 via ORAL
  Filled 2013-04-02 (×4): qty 1

## 2013-04-02 SURGICAL SUPPLY — 32 items
BAG HAMPER (MISCELLANEOUS) ×2 IMPLANT
CLOTH BEACON ORANGE TIMEOUT ST (SAFETY) ×2 IMPLANT
COVER LIGHT HANDLE STERIS (MISCELLANEOUS) ×4 IMPLANT
DECANTER SPIKE VIAL GLASS SM (MISCELLANEOUS) ×2 IMPLANT
DRAPE PROXIMA HALF (DRAPES) ×2 IMPLANT
DRAPE STERI URO 9X17 APER PCH (DRAPES) ×2 IMPLANT
ELECT REM PT RETURN 9FT ADLT (ELECTROSURGICAL) ×2
ELECTRODE REM PT RTRN 9FT ADLT (ELECTROSURGICAL) ×1 IMPLANT
FORMALIN 10 PREFIL 480ML (MISCELLANEOUS) ×2 IMPLANT
GAUZE PACKING 2X5 YD STERILE (GAUZE/BANDAGES/DRESSINGS) ×2 IMPLANT
GLOVE BIOGEL PI IND STRL 7.0 (GLOVE) ×3 IMPLANT
GLOVE BIOGEL PI INDICATOR 7.0 (GLOVE) ×3
GLOVE ECLIPSE 9.0 STRL (GLOVE) ×4 IMPLANT
GLOVE EXAM NITRILE MD LF STRL (GLOVE) ×2 IMPLANT
GLOVE INDICATOR STER SZ 9 (GLOVE) ×4 IMPLANT
GLOVE SS BIOGEL STRL SZ 6.5 (GLOVE) ×3 IMPLANT
GLOVE SUPERSENSE BIOGEL SZ 6.5 (GLOVE) ×3
GOWN STRL REIN 3XL LVL4 (GOWN DISPOSABLE) ×4 IMPLANT
GOWN STRL REIN XL XLG (GOWN DISPOSABLE) ×4 IMPLANT
KIT ROOM TURNOVER AP CYSTO (KITS) ×2 IMPLANT
MANIFOLD NEPTUNE II (INSTRUMENTS) ×2 IMPLANT
NEEDLE HYPO 25X1 1.5 SAFETY (NEEDLE) ×2 IMPLANT
NS IRRIG 1000ML POUR BTL (IV SOLUTION) ×2 IMPLANT
PACK PERI GYN (CUSTOM PROCEDURE TRAY) ×2 IMPLANT
PAD ARMBOARD 7.5X6 YLW CONV (MISCELLANEOUS) ×2 IMPLANT
SET BASIN LINEN APH (SET/KITS/TRAYS/PACK) ×2 IMPLANT
SUT CHROMIC 2 0 CT 1 (SUTURE) ×4 IMPLANT
SUT VIC AB 0 CT2 8-18 (SUTURE) ×2 IMPLANT
SYR BULB IRRIGATION 50ML (SYRINGE) ×2 IMPLANT
SYR CONTROL 10ML LL (SYRINGE) ×2 IMPLANT
TRAY FOLEY CATH 14FR (SET/KITS/TRAYS/PACK) ×2 IMPLANT
WATER STERILE IRR 1000ML POUR (IV SOLUTION) ×2 IMPLANT

## 2013-04-02 NOTE — Op Note (Signed)
See operative details included in the brief operative note

## 2013-04-02 NOTE — Brief Op Note (Signed)
04/02/2013  9:52 AM  PATIENT:  [name] White  72 y.o. female  PRE-OPERATIVE DIAGNOSIS:  uterovaginal prolapse, unspecified, cystocele rectocele  POST-OPERATIVE DIAGNOSIS:  uterovaginal prolapse, unspecified Cystocele, rectocele PROCEDURE:  Procedure(s): ANTERIOR (CYSTOCELE) AND POSTERIOR REPAIR (RECTOCELE) (N/A)  SURGEON:  Surgeon(s) and Role:    * Tilda Burrow, MD - Primary  PHYSICIAN ASSISTANT:   ASSISTANTS: Wrenn registered nurse   ANESTHESIA:   local and spinal  EBL:  Total I/O In: 1500 [I.V.:1500] Out: 330 [Urine:300; Blood:30]  BLOOD ADMINISTERED:none  DRAINS: Urinary Catheter (Foley) and Vaginal packing   LOCAL MEDICATIONS USED:  MARCAINE    and Amount: 20 ml  SPECIMEN:  No Specimen  DISPOSITION OF SPECIMEN:  N/A  COUNTS:  YES  TOURNIQUET:  * No tourniquets in log *  DICTATION: .Dragon Dictation Patient was taken operating room prepped and draped for vaginal procedure timeout conducted and Ancef administered. The vagina was inspected and the apical support was considered good. The cystocele was in the upper third of the vagina. There were urethral caruncles but these were not addressed surgically. The apex of the vagina was grasped with Allis clamps at each lateral support, the cystocele delineated and infiltrated with Marcaine with epinephrine beneath the epithelium. A midline incision was made over the cystocele and lateral dissection sharply performed underneath the epithelium for a distance of approximately 3 cm lateral on each side. The epithelium was then pulled together and reapproximated with subcuticular interrupted 0 Vicryl sutures x3, being careful to stay well away from the bladder. Redundant vaginal epithelium was trimmed and then running 2-0 chromic used to close the defect in the epithelium. Posterior repair: Posterior repair was performed using Marcaine to inject beneath the epithelium with perineum and up the posterior wall of vagina. The vaginal  epithelium was split in the midline and out for a distance of approximately 5 cm of the posterior vagina. Sharp dissection allowed separation of the vaginal epithelium from the underlying tissues. On the portion that was overlying the rectum, the operator's right index finger was placed in the rectum during this procedure to avoid tissues. Allis clamps were used for countertraction the vaginal epithelium and at no time was there suspicion of perforation. After changing gloves, the area was irrigated, and it was identified that there was some firm support on the right side and it appeared that the site-specific defect was to the right of the midline and a large amount of tissue on the left side could be identified that could be pulled across the midline and cephalad and attached in such a way to elevate the perineum and a series of 4 interrupted 0 Vicryl sutures pulled the tissue upward and stabilized it to the new support. Vaginal epithelium was trimmed minimally but reapproximated with continuous running subcuticular 2-0 chromic with good tissue approximation. Postprocedure support insignificantly improved and the perineum itself was elevated patient tolerated procedure well went to recovery room with vaginal packing in place and will be observed overnight and discharged in a.m. PLAN OF CARE: Admit for overnight observation  PATIENT DISPOSITION:  PACU - hemodynamically stable.   Delay start of Pharmacological VTE agent (>24hrs) due to surgical blood loss or risk of bleeding: yes

## 2013-04-02 NOTE — Preoperative (Signed)
Beta Blockers   Reason not to administer Beta Blockers:Not Applicable 

## 2013-04-02 NOTE — Anesthesia Postprocedure Evaluation (Signed)
  Anesthesia Post-op Note  Patient: [name] White  Procedure(s) Performed: Procedure(s): ANTERIOR (CYSTOCELE) AND POSTERIOR REPAIR (RECTOCELE) (N/A)  Patient Location: PACU  Anesthesia Type:Spinal  Level of Consciousness: awake, alert  and oriented  Airway and Oxygen Therapy: Patient Spontanous Breathing and Patient connected to nasal cannula oxygen  Post-op Pain: none  Post-op Assessment: Post-op Vital signs reviewed, Patient's Cardiovascular Status Stable, Respiratory Function Stable, Patent Airway and No signs of Nausea or vomiting  Post-op Vital Signs: Reviewed and stable  Complications: No apparent anesthesia complications

## 2013-04-02 NOTE — Anesthesia Procedure Notes (Addendum)
Spinal  Patient location during procedure: OR Start time: 04/02/2013 8:01 AM Staffing Anesthesiologist: Laurene Footman CRNA/Resident: ANDRAZA, Berniece Abid L Preanesthetic Checklist Completed: patient identified, site marked, surgical consent, pre-op evaluation, timeout performed, IV checked, risks and benefits discussed and monitors and equipment checked Spinal Block Patient position: right lateral decubitus Prep: PCMX Patient monitoring: heart rate, cardiac monitor, continuous pulse ox and blood pressure Approach: right paramedian Location: L3-4 Injection technique: single-shot Needle Needle type: Spinocan  Needle gauge: 22 G Needle length: 9 cm Assessment Sensory level: T8 Additional Notes ATTEMPTS:2 TRAY [id number] TRAY EXPIRATION DATE:06/2013 Attempt x1 by Carolyne Littles, CRNA; Spinal placed by Dr. Marcos Eke on first attempt; Marcaine 15mg , fentanyl 20 mcg and epi. 1 injected intrathecally at 0801; patient tolerated well.  Date/Time: 04/02/2013 7:41 AM Performed by: Carolyne Littles, Manpreet Strey L Pre-anesthesia Checklist: Patient identified, Patient being monitored, Emergency Drugs available, Timeout performed and Suction available Oxygen Delivery Method: Non-rebreather mask

## 2013-04-02 NOTE — Anesthesia Preprocedure Evaluation (Signed)
Anesthesia Evaluation  Patient identified by MRN, date of birth, ID band Patient awake    Reviewed: Allergy & Precautions, H&P , NPO status , Patient's Chart, lab work & pertinent test results  History of Anesthesia Complications (+) PONV  Airway Mallampati: II TM Distance: >3 FB     Dental  (+) Teeth Intact   Pulmonary shortness of breath and with exertion, asthma ,  breath sounds clear to auscultation        Cardiovascular hypertension, + Peripheral Vascular Disease Rhythm:Regular     Neuro/Psych  Headaches, PSYCHIATRIC DISORDERS Depression    GI/Hepatic   Endo/Other  diabetes, Well Controlled, Type 2, Oral Hypoglycemic AgentsHypothyroidism   Renal/GU      Musculoskeletal   Abdominal   Peds  Hematology   Anesthesia Other Findings   Reproductive/Obstetrics                           Anesthesia Physical Anesthesia Plan  ASA: III  Anesthesia Plan: Spinal   Post-op Pain Management:    Induction:   Airway Management Planned: Nasal Cannula  Additional Equipment:   Intra-op Plan:   Post-operative Plan:   Informed Consent: I have reviewed the patients History and Physical, chart, labs and discussed the procedure including the risks, benefits and alternatives for the proposed anesthesia with the patient or authorized representative who has indicated his/her understanding and acceptance.     Plan Discussed with:   Anesthesia Plan Comments:         Anesthesia Quick Evaluation

## 2013-04-02 NOTE — Interval H&P Note (Signed)
History and Physical Interval Note:  04/02/2013 7:35 AM  [name]  has presented today for surgery, with the diagnosis of rectocele, and cystocele  The various methods of treatment have been discussed with the patient and family. After consideration of risks, benefits and other options for treatment, the patient has consented to  Procedure(s): ANTERIOR (CYSTOCELE) AND POSTERIOR REPAIR (RECTOCELE) (N/A) as a surgical intervention .  The patient's history has been reviewed, patient examined, no change in status, stable for surgery.  I have reviewed the patient's chart and labs.  Questions were answered to the patient's satisfaction.     [name] White

## 2013-04-02 NOTE — Transfer of Care (Signed)
Immediate Anesthesia Transfer of Care Note  Patient: [name] White  Procedure(s) Performed: Procedure(s): ANTERIOR (CYSTOCELE) AND POSTERIOR REPAIR (RECTOCELE) (N/A)  Patient Location: PACU  Anesthesia Type:Spinal  Level of Consciousness: awake, alert  and oriented  Airway & Oxygen Therapy: Patient Spontanous Breathing and Patient connected to nasal cannula oxygen  Post-op Assessment: Report given to PACU RN  Post vital signs: Reviewed and stable  Complications: No apparent anesthesia complications

## 2013-04-03 LAB — CBC
HCT: 37 % (ref 36.0–46.0)
MCHC: 33 g/dL (ref 30.0–36.0)
MCV: 95.6 fL (ref 78.0–100.0)
Platelets: 220 10*3/uL (ref 150–400)
RDW: 14.1 % (ref 11.5–15.5)
WBC: 12.1 10*3/uL — ABNORMAL HIGH (ref 4.0–10.5)

## 2013-04-03 LAB — BASIC METABOLIC PANEL
BUN: 11 mg/dL (ref 6–23)
Calcium: 8.2 mg/dL — ABNORMAL LOW (ref 8.4–10.5)
Creatinine, Ser: 0.64 mg/dL (ref 0.50–1.10)
GFR calc Af Amer: >90 mL/min (ref >90)
GFR calc non Af Amer: 88 mL/min — ABNORMAL LOW (ref >90)

## 2013-04-03 LAB — GLUCOSE, CAPILLARY: Glucose-Capillary: 108 mg/dL — ABNORMAL HIGH (ref 70–99)

## 2013-04-03 MED ORDER — LIRAGLUTIDE 18 MG/3ML ~~LOC~~ SOLN: 1.8000 mg | Freq: Once | SUBCUTANEOUS | Status: DC

## 2013-04-03 MED ORDER — CIPROFLOXACIN HCL 500 MG PO TABS: 500.0000 mg | ORAL_TABLET | Freq: Two times a day (BID) | ORAL | Status: DC

## 2013-04-03 MED ORDER — CIPROFLOXACIN HCL 250 MG PO TABS
500.0000 mg | ORAL_TABLET | Freq: Two times a day (BID) | ORAL | Status: DC
  Administered 2013-04-03: 500 mg via ORAL
  Filled 2013-04-03: qty 2

## 2013-04-03 MED ORDER — OXYCODONE-ACETAMINOPHEN 5-325 MG PO TABS: 1.0000 | ORAL_TABLET | Freq: Four times a day (QID) | ORAL | Status: DC | PRN

## 2013-04-03 MED ORDER — OXYCODONE-ACETAMINOPHEN 5-325 MG PO TABS
1.0000 | ORAL_TABLET | Freq: Four times a day (QID) | ORAL | Status: DC | PRN
  Administered 2013-04-03: 2 via ORAL
  Filled 2013-04-03: qty 2

## 2013-04-03 NOTE — Progress Notes (Signed)
Patient received discharge instructions along with follow up appointments and prescriptions. Patient verbalized understanding of all instructions. Patient was escorted by staff via wheelchair to vehicle. Patient discharged to home in stable condition. 

## 2013-04-03 NOTE — Progress Notes (Signed)
UR Chart Review Completed  

## 2013-04-03 NOTE — Discharge Summary (Signed)
Physician Discharge Summary  Patient ID: [name] White MRN: [id number] DOB/AGE: February 10, 1941 72 y.o.  Admit date: 04/02/2013 Discharge date: 04/03/2013  Admission Diagnoses: Rectocele, cystocele  Discharge Diagnoses: Rectocele, cystocele Active Problems:   * No active hospital problems. *  Diagnosis  Date  .  Varicose veins  .  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  .  HYPOTHYROIDISM  09/14/2007  .  HYPERCHOLESTEROLEMIA  02/09/2009   DIABETES MELLITUS, TYPE II  09/14/2007 .  MIGRAINE HEADACHE  09/14/2007 .  HYPERTENSION  02/09/2009 .  SUPERFICIAL PHLEBITIS  09/14/2007 .  PANCREATITIS  09/14/2007 .  OSTEOPOROSIS  02/09/2009 .  ASYMPTOMATIC POSTMENOPAUSAL STATUS  02/09/2009 .  PONV (postoperative nausea and vomiting) .  Shortness of breath .  Asthma.  Neuropathy .  Rectocele  03/19/2013 .  Constipation   Discharged Condition: good  Hospital Course: Patient was admitted through short stay, underwent anterior and posterior repair as described in the operative note, was admitted for overnight pain management with Foley catheter and vaginal pack in place. Blood pressures remained normal to low normal, blood sugars were in the 110-200 range on sliding scale insulin, Foley catheter was removed the following morning and patient discharged postop day 1. Postop hemoglobin 12.2 compared 13.3 preop followup 2 weeks my office. Patient will be discharged on Cipro, Percocet and her preadmission medications unchanged  Consults: None  Significant Diagnostic Studies: labs  CBC    Component Value Date/Time   WBC 12.1* 04/03/2013 0557   RBC 3.87 04/03/2013 0557   HGB 12.2 04/03/2013 0557   HCT 37.0 04/03/2013 0557   PLT 220 04/03/2013 0557   MCV 95.6 04/03/2013 0557   MCH 31.5 04/03/2013 0557   MCHC 33.0 04/03/2013 0557   RDW 14.1 04/03/2013 0557   MONOABS 1.3* 02/09/2009 1509   EOSABS 0.6 02/09/2009 1509   BASOSABS 0.0 02/09/2009 1509      Treatments: surgery: Anterior and posterior vaginal  repair  Discharge Exam: Blood pressure 92/58, pulse 74, temperature 97.9 F (36.6 C), temperature source Oral, resp. rate 18, height 5\' 6"  (1.676 m), weight 188 lb 0.8 oz (85.3 kg), SpO2 95.00%. GI: soft, non-tender; bowel sounds normal; no masses,  no organomegaly Pelvic: mild soreness, no swelling   Disposition: 01-Home or Self Care   Future Appointments Provider Department Dept Phone   04/15/2013 9:15 AM Tilda Burrow, MD [facility] [phone]   04/22/2013 10:45 AM Romero Belling, MD [facility] [phone]       Medication List    ASK your doctor about these medications       ACCU-CHEK AVIVA PLUS test strip  Generic drug:  glucose blood     albuterol 108 (90 BASE) MCG/ACT inhaler  Commonly known as:  PROVENTIL HFA;VENTOLIN HFA  Inhale 2 puffs into the lungs every 4 (four) hours as needed.     beclomethasone 80 MCG/ACT inhaler  Commonly known as:  QVAR  Inhale 2 puffs into the lungs 2 (two) times daily.     colesevelam 625 MG tablet  Commonly known as:  WELCHOL  Take 3 tablets (1,875 mg total) by mouth 2 (two) times daily with a meal.     cycloSPORINE 0.05 % ophthalmic emulsion  Commonly known as:  RESTASIS  Place 1 drop into both eyes 2 (two) times daily.     estradiol 1 MG tablet  Commonly known as:  ESTRACE  Take 1 mg by mouth every morning.     HYDROcodone-acetaminophen 5-500 MG per tablet  Commonly  known as:  VICODIN  Take 1 tablet by mouth every 4 (four) hours as needed. For pain     Insulin Pen Needle 32G X 5 MM Misc  Commonly known as:  NOVOTWIST  Use as directed once a day.     V250.00     levothyroxine 50 MCG tablet  Commonly known as:  SYNTHROID, LEVOTHROID  Take 1 tablet (50 mcg total) by mouth daily.     Liraglutide 18 MG/3ML Soln injection  Commonly known as:  VICTOZA  Inject 0.3 mLs (1.8 mg total) into the skin once.     lisinopril 5 MG tablet  Commonly known as:  PRINIVIL,ZESTRIL  Take 5 mg by mouth  every morning.     metFORMIN 500 MG 24 hr tablet  Commonly known as:  GLUCOPHAGE-XR  TAKE TWO TABLETS BY MOUTH DAILY     multivitamins ther. w/minerals Tabs  Take 1 tablet by mouth every morning.     omeprazole 20 MG capsule  Commonly known as:  PRILOSEC  Take 20 mg by mouth every morning.     pioglitazone 45 MG tablet  Commonly known as:  ACTOS  TAKE 1 TABLET (45 MG TOTAL) BY MOUTH DAILY.     pregabalin 150 MG capsule  Commonly known as:  LYRICA  Take 150 mg by mouth 2 (two) times daily.     rizatriptan 10 MG disintegrating tablet  Commonly known as:  MAXALT-MLT  Take 10 mg by mouth daily as needed. May repeat in 2 hours if needed     simvastatin 80 MG tablet  Commonly known as:  ZOCOR  Take 1 tablet (80 mg total) by mouth at bedtime.     venlafaxine XR 150 MG 24 hr capsule  Commonly known as:  EFFEXOR-XR  Take 150 mg by mouth every morning.        Cipro, percocet SignedTilda Burrow 04/03/2013, 8:13 AM

## 2013-04-04 ENCOUNTER — Encounter (HOSPITAL_COMMUNITY): Payer: Self-pay | Admitting: Obstetrics and Gynecology

## 2013-04-05 ENCOUNTER — Telehealth: Payer: Self-pay | Admitting: Obstetrics and Gynecology

## 2013-04-05 NOTE — Telephone Encounter (Signed)
Pt has surgery on 04/02/2013, has not had a BM since surgery, taking OTC Mira lax, no discomfort or feeling of constipation. Per Dr. Emelda Fear, Pt to continue Mira lax, may take more time to recover from surgery, also pt instructed can take a enema if she feels necessary. Pt instructed to call back with any other complications or questions. Pt verbalized understanding.

## 2013-04-11 ENCOUNTER — Encounter: Payer: Self-pay | Admitting: *Deleted

## 2013-04-15 ENCOUNTER — Encounter: Payer: Self-pay | Admitting: Obstetrics and Gynecology

## 2013-04-15 ENCOUNTER — Ambulatory Visit (INDEPENDENT_AMBULATORY_CARE_PROVIDER_SITE_OTHER): Payer: Medicare Other | Admitting: Obstetrics and Gynecology

## 2013-04-15 VITALS — BP 132/78 | Ht 66.0 in | Wt 182.6 lb

## 2013-04-15 DIAGNOSIS — Z9889 Other specified postprocedural states: Secondary | ICD-10-CM

## 2013-04-15 DIAGNOSIS — Z09 Encounter for follow-up examination after completed treatment for conditions other than malignant neoplasm: Secondary | ICD-10-CM

## 2013-04-15 NOTE — Patient Instructions (Signed)
Gradually increase activity

## 2013-04-15 NOTE — Progress Notes (Signed)
Subjective:     [name] White is a 72 y.o. female who presents to the clinic 2 weeks status post post repari, ant repair for . The patient is not having any pain.    Review of Systems     Objective:    BP 132/78  Ht 5\' 6"  (1.676 m)  Wt 182 lb 9.6 oz (82.827 kg)  BMI 29.49 kg/m2 General:  no distress  Abdomen: soft, bowel sounds active, non-tender  Incision:   no swelling, no dehiscence, incision well approximated     Assessment:    Doing well postoperatively. Operative findings again reviewed. Pathology report discussed.    Plan:    1. Continue any current medications. 2. Wound care discussed. 3. Activity restrictions: gradual resumptio of  activities 4. Anticipated return to work: not applicable. 5. Follow up: 4 week.

## 2013-04-22 ENCOUNTER — Ambulatory Visit (INDEPENDENT_AMBULATORY_CARE_PROVIDER_SITE_OTHER): Payer: Medicare Other | Admitting: Endocrinology

## 2013-04-22 VITALS — BP 120/72 | HR 90 | Wt 179.0 lb

## 2013-04-22 DIAGNOSIS — E119 Type 2 diabetes mellitus without complications: Secondary | ICD-10-CM

## 2013-04-22 LAB — MICROALBUMIN / CREATININE URINE RATIO
Creatinine,U: 13.6 mg/dL
Microalb Creat Ratio: 8.1 mg/g (ref 0.0–30.0)

## 2013-04-22 NOTE — Progress Notes (Signed)
Subjective:    Patient ID: [name] White, female    DOB: 1941/12/25, 72 y.o.   MRN: [id number]  HPI Pt returns for f/u of type 2 DM (dx'ed 1999, after an episode of pancreatitis (no pancreatitis since then)--no known complications; she is on 3 oral agents+victoza).  pt states she feels well in general.  no cbg record, but states cbg's are well-controlled.    Past Medical History  Diagnosis Date  . Varicose veins   . 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 HYPOTHYROIDISM 09/14/2007  . HYPERCHOLESTEROLEMIA 02/09/2009  . DIABETES MELLITUS, TYPE II 09/14/2007  . MIGRAINE HEADACHE 09/14/2007  . HYPERTENSION 02/09/2009  . SUPERFICIAL PHLEBITIS 09/14/2007  . PANCREATITIS 09/14/2007  . OSTEOPOROSIS 02/09/2009  . ASYMPTOMATIC POSTMENOPAUSAL STATUS 02/09/2009  . PONV (postoperative nausea and vomiting)   . Shortness of breath   . Asthma   . Neuropathy   . Rectocele 03/19/2013  . Constipation 03/19/2013    Past Surgical History  Procedure Laterality Date  . Abdominal hysterectomy    . Nm esophageal reflux  08-11-11  . Transphenoidal / transnasal hypophysectomy / resection pituitary tumor  08-11-11  . Cholecystectomy    . Pituitary excision  10/1997  . Endovenous ablation saphenous vein w/ laser  11-03-2011   right greater saphenous vein    left leg done 10-2011  . Eye surgery  98    right cataract extraction 98  . Cataract extraction w/phaco  12/05/2011    Procedure: CATARACT EXTRACTION PHACO AND INTRAOCULAR LENS PLACEMENT (IOC);  Surgeon: Gemma Payor;  Location: [facility];  Service: Ophthalmology;  Laterality: Left;  CDE:9.65  . Posterior repair    . Cystoscopy    . Laparoscopic nissen fundoplication    . Anterior and posterior repair N/A 04/02/2013    Procedure: ANTERIOR (CYSTOCELE) AND POSTERIOR REPAIR (RECTOCELE);  Surgeon: Tilda Burrow, MD;  Location: [facility];  Service: Gynecology;  Laterality: N/A;    History   Social History  . Marital Status: Married    Spouse Name: N/A    Number of  Children: N/A  . Years of Education: N/A   Occupational History  . Retired    Social History Main Topics  . Smoking status: Never Smoker   . Smokeless tobacco: Never Used  . Alcohol Use: No  . Drug Use: No  . Sexually Active: Yes    Birth Control/ Protection: Surgical   Other Topics Concern  . Not on file   Social History Narrative  . No narrative on file    Current Outpatient Prescriptions on File Prior to Visit  Medication Sig Dispense Refill  . ACCU-CHEK AVIVA PLUS test strip       . albuterol (PROVENTIL HFA;VENTOLIN HFA) 108 (90 BASE) MCG/ACT inhaler Inhale 2 puffs into the lungs every 4 (four) hours as needed.        . beclomethasone (QVAR) 80 MCG/ACT inhaler Inhale 2 puffs into the lungs 2 (two) times daily.        . ciprofloxacin (CIPRO) 500 MG tablet Take 1 tablet (500 mg total) by mouth 2 (two) times daily.  14 tablet  0  . colesevelam (WELCHOL) 625 MG tablet Take 3 tablets (1,875 mg total) by mouth 2 (two) times daily with a meal.  360 tablet  4  . cycloSPORINE (RESTASIS) 0.05 % ophthalmic emulsion Place 1 drop into both eyes 2 (two) times daily.        Marland Kitchen estradiol (ESTRACE) 1 MG tablet Take 1 mg by mouth every morning.       Marland Kitchen  HYDROcodone-acetaminophen (VICODIN) 5-500 MG per tablet Take 1 tablet by mouth every 4 (four) hours as needed. For pain      . Insulin Pen Needle (NOVOTWIST) 32G X 5 MM MISC Use as directed once a day.   V250.00  100 each  6  . levothyroxine (SYNTHROID, LEVOTHROID) 50 MCG tablet Take 1 tablet (50 mcg total) by mouth daily.  30 tablet  8  . Liraglutide (VICTOZA) 18 MG/3ML SOLN injection Inject 0.3 mLs (1.8 mg total) into the skin once.  9 mL  1  . lisinopril (PRINIVIL,ZESTRIL) 5 MG tablet Take 5 mg by mouth every morning.        . metFORMIN (GLUCOPHAGE-XR) 500 MG 24 hr tablet TAKE TWO TABLETS BY MOUTH DAILY  60 tablet  5  . Multiple Vitamins-Minerals (MULTIVITAMINS THER. W/MINERALS) TABS Take 1 tablet by mouth every morning.        Marland Kitchen omeprazole  (PRILOSEC) 20 MG capsule Take 20 mg by mouth every morning.       Marland Kitchen oxyCODONE-acetaminophen (PERCOCET/ROXICET) 5-325 MG per tablet Take 1 tablet by mouth every 6 (six) hours as needed.  20 tablet  0  . pioglitazone (ACTOS) 45 MG tablet TAKE 1 TABLET (45 MG TOTAL) BY MOUTH DAILY.  90 tablet  3  . pregabalin (LYRICA) 150 MG capsule Take 150 mg by mouth 2 (two) times daily.       . rizatriptan (MAXALT-MLT) 10 MG disintegrating tablet Take 10 mg by mouth daily as needed. May repeat in 2 hours if needed      . simvastatin (ZOCOR) 80 MG tablet Take 1 tablet (80 mg total) by mouth at bedtime.  30 tablet  5  . venlafaxine (EFFEXOR-XR) 150 MG 24 hr capsule Take 150 mg by mouth every morning.        No current facility-administered medications on file prior to visit.    Allergies  Allergen Reactions  . Betadine (Povidone Iodine) Hives  . Penicillins Rash    Family History  Problem Relation Age of Onset  . Cancer Neg Hx   . Anesthesia problems Neg Hx   . Hypotension Neg Hx   . Malignant hyperthermia Neg Hx   . Pseudochol deficiency Neg Hx   . Heart disease Mother   . Asthma Mother   . COPD Father   . Heart disease Father   . Asthma Daughter   . Migraines Daughter   . Neuropathy Son     BP 120/72  Pulse 90  Wt 179 lb (81.194 kg)  BMI 28.91 kg/m2  SpO2 95%    Review of Systems denies hypoglycemia.      Objective:   Physical Exam Pulses: dorsalis pedis intact bilat.   Feet: no deformity. feet are of normal color and temp.  no edema.   Skin:  no ulcer on the feet.   Neuro: sensation is intact to touch on the feet.     Lab Results  Component Value Date   HGBA1C 6.5 04/22/2013      Assessment & Plan:  DM: well-controlled

## 2013-04-22 NOTE — Patient Instructions (Addendum)
blood tests are being requested for you today.  You will be contacted with results.   check your blood sugar 1 time a day.  vary the time of day when you check, between before the 3 meals, and at bedtime.  also check if you have symptoms of your blood sugar being too high or too low.  please keep a record of the readings and bring it to your next appointment here.  please call us sooner if your blood sugar goes below 70, or if it stays over 200.   Please come back for a follow-up appointment in 6 months.

## 2013-05-13 ENCOUNTER — Ambulatory Visit (INDEPENDENT_AMBULATORY_CARE_PROVIDER_SITE_OTHER): Payer: Medicare Other | Admitting: Obstetrics and Gynecology

## 2013-05-13 ENCOUNTER — Encounter: Payer: Self-pay | Admitting: Obstetrics and Gynecology

## 2013-05-13 VITALS — BP 128/84 | Ht 66.0 in | Wt 180.0 lb

## 2013-05-13 DIAGNOSIS — N952 Postmenopausal atrophic vaginitis: Secondary | ICD-10-CM

## 2013-05-13 DIAGNOSIS — K5900 Constipation, unspecified: Secondary | ICD-10-CM

## 2013-05-13 MED ORDER — ESTRADIOL 0.1 MG/GM VA CREA: 1.5200 g | TOPICAL_CREAM | Freq: Every day | VAGINAL | Status: DC

## 2013-05-13 NOTE — Patient Instructions (Signed)
Discontinue the oral estrogen. (Use vaginal estrogen one fourth applicator at bedtime 2-3 times weekly until healing completed after surgery then twice weekly long-term

## 2013-05-13 NOTE — Progress Notes (Signed)
Subjective:     [name] White is a 72 y.o. female who presents to the clinic 6 weeks status post anterior and posterior repair for cystocele, rectocele. Eating a regular diet without difficulty. Bowel movements are continuing to be finicky, with pt still on Miralax and noting some loss of stool activitites. pain slight on left side without radiation in to leg or back    Review of Systems Gastrointestinal: positive for soiling panties with activity,  Genitourinary:negative    Objective:    BP 128/84  Ht 5\' 6"  (1.676 m)  Wt 180 lb (81.647 kg)  BMI 29.07 kg/m2 General:  alert, cooperative, appears stated age and no distress  Abdomen: soft, bowel sounds active, non-tender  Incision:   perineum prominent labial and vag tissues, pt's normal anatomy , and posterior support good with valsalva, Bladder support excellent and healing almost complete. Slight ridge in midline at posterior repair with strong post wall support Rectal: no pouch(resolved), post wall smooth at 45 degree angle to vagina Which should allow stool expulsion     Assessment:    Postoperative course complicated by persistent stool control issues Operative findings again reviewed. .    Plan:    1. Continue any current medications, miralax . 2. Wound care discussed. aviod sex til nontender to self-exam 3. Activity restrictions: none 4. Anticipated return to work: not applicable. 5. Follow up: prn   or if still sore at 12 weeks.

## 2013-06-06 ENCOUNTER — Other Ambulatory Visit: Payer: Self-pay | Admitting: *Deleted

## 2013-06-06 MED ORDER — SIMVASTATIN 80 MG PO TABS: 80.0000 mg | ORAL_TABLET | Freq: Every day | ORAL | Status: DC

## 2013-07-01 ENCOUNTER — Other Ambulatory Visit: Payer: Self-pay | Admitting: *Deleted

## 2013-07-01 MED ORDER — LIRAGLUTIDE 18 MG/3ML ~~LOC~~ SOPN: 1.8000 mg | PEN_INJECTOR | Freq: Once | SUBCUTANEOUS | Status: DC

## 2013-08-29 ENCOUNTER — Other Ambulatory Visit: Payer: Self-pay | Admitting: *Deleted

## 2013-08-29 MED ORDER — METFORMIN HCL ER 500 MG PO TB24: ORAL_TABLET | ORAL | Status: DC

## 2013-08-29 MED ORDER — LIRAGLUTIDE 18 MG/3ML ~~LOC~~ SOPN: 1.8000 mg | PEN_INJECTOR | Freq: Once | SUBCUTANEOUS | Status: DC

## 2013-09-06 ENCOUNTER — Other Ambulatory Visit: Payer: Self-pay | Admitting: Obstetrics and Gynecology

## 2013-09-06 ENCOUNTER — Other Ambulatory Visit: Payer: Self-pay

## 2013-09-06 MED ORDER — LEVOTHYROXINE SODIUM 50 MCG PO TABS: 50.0000 ug | ORAL_TABLET | Freq: Every day | ORAL | Status: DC

## 2013-09-06 MED ORDER — SIMVASTATIN 80 MG PO TABS: 80.0000 mg | ORAL_TABLET | Freq: Every day | ORAL | Status: DC

## 2013-10-21 ENCOUNTER — Ambulatory Visit: Payer: Medicare Other | Admitting: Endocrinology

## 2013-10-22 ENCOUNTER — Ambulatory Visit (INDEPENDENT_AMBULATORY_CARE_PROVIDER_SITE_OTHER): Payer: Medicare Other | Admitting: Endocrinology

## 2013-10-22 ENCOUNTER — Encounter: Payer: Self-pay | Admitting: Endocrinology

## 2013-10-22 VITALS — BP 110/76 | Temp 98.2°F | Wt 180.7 lb

## 2013-10-22 DIAGNOSIS — E119 Type 2 diabetes mellitus without complications: Secondary | ICD-10-CM

## 2013-10-22 LAB — HEMOGLOBIN A1C: Mean Plasma Glucose: 126 mg/dL — ABNORMAL HIGH (ref <117)

## 2013-10-22 NOTE — Progress Notes (Signed)
Subjective:    Patient ID: [name] White, female    DOB: [birth year], 72 y.o.   MRN: [id number]  HPI Pt returns for f/u of type 2 DM (dx'ed 1999, after an episode of pancreatitis (no pancreatitis since then)--no known complications; she is on 3 oral agents+victoza; she has never taken insulin).  pt states she feels well in general.  no cbg record, but states cbg's are well-controlled.   Past Medical History  Diagnosis Date  . Varicose veins   . 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 HYPOTHYROIDISM 09/14/2007  . HYPERCHOLESTEROLEMIA 02/09/2009  . DIABETES MELLITUS, TYPE II 09/14/2007  . MIGRAINE HEADACHE 09/14/2007  . HYPERTENSION 02/09/2009  . SUPERFICIAL PHLEBITIS 09/14/2007  . PANCREATITIS 09/14/2007  . OSTEOPOROSIS 02/09/2009  . ASYMPTOMATIC POSTMENOPAUSAL STATUS 02/09/2009  . PONV (postoperative nausea and vomiting)   . Shortness of breath   . Asthma   . Neuropathy   . Rectocele 03/19/2013  . Constipation 03/19/2013    Past Surgical History  Procedure Laterality Date  . Abdominal hysterectomy    . Nm esophageal reflux  08-11-11  . Transphenoidal / transnasal hypophysectomy / resection pituitary tumor  08-11-11  . Cholecystectomy    . Pituitary excision  10/1997  . Endovenous ablation saphenous vein w/ laser  11-03-2011   right greater saphenous vein    left leg done 10-2011  . Eye surgery  98    right cataract extraction 98  . Cataract extraction w/phaco  12/05/2011    Procedure: CATARACT EXTRACTION PHACO AND INTRAOCULAR LENS PLACEMENT (IOC);  Surgeon: Gemma Payor;  Location: [facility];  Service: Ophthalmology;  Laterality: Left;  CDE:9.65  . Posterior repair    . Cystoscopy    . Laparoscopic nissen fundoplication    . Anterior and posterior repair N/A 04/02/2013    Procedure: ANTERIOR (CYSTOCELE) AND POSTERIOR REPAIR (RECTOCELE);  Surgeon: Tilda Burrow, MD;  Location: [facility];  Service: Gynecology;  Laterality: N/A;    History   Social History  . Marital Status: Married    Spouse  Name: N/A    Number of Children: N/A  . Years of Education: N/A   Occupational History  . Retired    Social History Main Topics  . Smoking status: Never Smoker   . Smokeless tobacco: Never Used  . Alcohol Use: No  . Drug Use: No  . Sexual Activity: Yes    Birth Control/ Protection: Surgical   Other Topics Concern  . Not on file   Social History Narrative  . No narrative on file    Current Outpatient Prescriptions on File Prior to Visit  Medication Sig Dispense Refill  . ACCU-CHEK AVIVA PLUS test strip       . albuterol (PROVENTIL HFA;VENTOLIN HFA) 108 (90 BASE) MCG/ACT inhaler Inhale 2 puffs into the lungs every 4 (four) hours as needed.        . beclomethasone (QVAR) 80 MCG/ACT inhaler Inhale 2 puffs into the lungs 2 (two) times daily.        . colesevelam (WELCHOL) 625 MG tablet Take 3 tablets (1,875 mg total) by mouth 2 (two) times daily with a meal.  360 tablet  4  . cycloSPORINE (RESTASIS) 0.05 % ophthalmic emulsion Place 1 drop into both eyes 2 (two) times daily.        Marland Kitchen estradiol (ESTRACE VAGINAL) 0.1 MG/GM vaginal cream Place 0.25 Applicatorfuls vaginally daily.  42.5 g  12  . estradiol (ESTRACE) 1 MG tablet TAKE ONE TABLET ONCE DAILY  90 tablet  1  . HYDROcodone-acetaminophen (VICODIN) 5-500 MG per tablet Take 1 tablet by mouth every 4 (four) hours as needed. For pain      . Insulin Pen Needle (NOVOTWIST) 32G X 5 MM MISC Use as directed once a day.   V250.00  100 each  6  . levothyroxine (SYNTHROID, LEVOTHROID) 50 MCG tablet Take 1 tablet (50 mcg total) by mouth daily.  30 tablet  8  . lisinopril (PRINIVIL,ZESTRIL) 5 MG tablet Take 5 mg by mouth every morning.        . metFORMIN (GLUCOPHAGE-XR) 500 MG 24 hr tablet TAKE TWO TABLETS BY MOUTH DAILY  60 tablet  2  . Multiple Vitamins-Minerals (MULTIVITAMINS THER. W/MINERALS) TABS Take 1 tablet by mouth every morning.        Marland Kitchen omeprazole (PRILOSEC) 20 MG capsule Take 20 mg by mouth every morning.       . pioglitazone  (ACTOS) 45 MG tablet TAKE 1 TABLET (45 MG TOTAL) BY MOUTH DAILY.  90 tablet  3  . pregabalin (LYRICA) 150 MG capsule Take 150 mg by mouth 2 (two) times daily.       . rizatriptan (MAXALT-MLT) 10 MG disintegrating tablet Take 10 mg by mouth daily as needed. May repeat in 2 hours if needed      . simvastatin (ZOCOR) 80 MG tablet Take 1 tablet (80 mg total) by mouth at bedtime.  30 tablet  2  . venlafaxine (EFFEXOR-XR) 150 MG 24 hr capsule Take 150 mg by mouth every morning.        No current facility-administered medications on file prior to visit.    Allergies  Allergen Reactions  . Betadine [Povidone Iodine] Hives  . Penicillins Rash    Family History  Problem Relation Age of Onset  . Cancer Neg Hx   . Anesthesia problems Neg Hx   . Hypotension Neg Hx   . Malignant hyperthermia Neg Hx   . Pseudochol deficiency Neg Hx   . Heart disease Mother   . Asthma Mother   . COPD Father   . Heart disease Father   . Asthma Daughter   . Migraines Daughter   . Neuropathy Son     BP 110/76  Temp(Src) 98.2 F (36.8 C) (Oral)  Wt 180 lb 11.2 oz (81.965 kg)  BMI 29.18 kg/m2  Review of Systems denies hypoglycemia and weight change.      Objective:   Physical Exam VITAL SIGNS:  See vs page. GENERAL: no distress.  Lab Results  Component Value Date   HGBA1C 6.0* 10/22/2013      Assessment & Plan:  Type 2 DM: well-controlled Pancreatitis, by hx.  Because she is tolerating victoza well, she can continue for now.

## 2013-10-22 NOTE — Patient Instructions (Signed)
blood tests are being requested for you today.  You will be contacted with results.   check your blood sugar 1 time a day.  vary the time of day when you check, between before the 3 meals, and at bedtime.  also check if you have symptoms of your blood sugar being too high or too low.  please keep a record of the readings and bring it to your next appointment here.  please call us sooner if your blood sugar goes below 70, or if it stays over 200.   Please come back for a follow-up appointment in 6 months.

## 2013-10-31 ENCOUNTER — Other Ambulatory Visit: Payer: Self-pay | Admitting: *Deleted

## 2013-10-31 MED ORDER — LIRAGLUTIDE 18 MG/3ML ~~LOC~~ SOPN: 1.8000 mg | PEN_INJECTOR | Freq: Once | SUBCUTANEOUS | Status: DC

## 2013-11-15 ENCOUNTER — Other Ambulatory Visit: Payer: Self-pay | Admitting: *Deleted

## 2013-11-15 MED ORDER — COLESEVELAM HCL 625 MG PO TABS: 1875.0000 mg | ORAL_TABLET | Freq: Two times a day (BID) | ORAL | Status: DC

## 2013-11-25 ENCOUNTER — Encounter: Payer: Self-pay | Admitting: Endocrinology

## 2013-11-29 ENCOUNTER — Telehealth: Payer: Self-pay

## 2013-11-29 MED ORDER — METFORMIN HCL ER 500 MG PO TB24: ORAL_TABLET | ORAL | Status: DC

## 2013-11-29 NOTE — Telephone Encounter (Signed)
Metformin was refilled and sent to [facility].

## 2013-12-06 ENCOUNTER — Other Ambulatory Visit: Payer: Self-pay | Admitting: Endocrinology

## 2013-12-12 ENCOUNTER — Other Ambulatory Visit: Payer: Self-pay

## 2013-12-12 MED ORDER — LIRAGLUTIDE 18 MG/3ML ~~LOC~~ SOPN: PEN_INJECTOR | SUBCUTANEOUS | Status: DC

## 2014-01-09 ENCOUNTER — Other Ambulatory Visit: Payer: Self-pay | Admitting: Endocrinology

## 2014-01-22 ENCOUNTER — Other Ambulatory Visit: Payer: Self-pay | Admitting: Endocrinology

## 2014-01-29 ENCOUNTER — Other Ambulatory Visit: Payer: Self-pay | Admitting: Endocrinology

## 2014-02-19 ENCOUNTER — Other Ambulatory Visit: Payer: Self-pay | Admitting: Endocrinology

## 2014-03-05 ENCOUNTER — Other Ambulatory Visit: Payer: Self-pay | Admitting: Adult Health

## 2014-03-06 ENCOUNTER — Other Ambulatory Visit: Payer: Self-pay | Admitting: *Deleted

## 2014-03-06 NOTE — Telephone Encounter (Signed)
Opened in error, prior authorization verses refill request.

## 2014-04-02 ENCOUNTER — Encounter: Payer: Self-pay | Admitting: Endocrinology

## 2014-04-03 ENCOUNTER — Other Ambulatory Visit: Payer: Self-pay | Admitting: Endocrinology

## 2014-04-08 ENCOUNTER — Ambulatory Visit: Payer: Medicare Other | Admitting: Endocrinology

## 2014-04-11 ENCOUNTER — Ambulatory Visit (INDEPENDENT_AMBULATORY_CARE_PROVIDER_SITE_OTHER): Payer: Medicare Other | Admitting: Endocrinology

## 2014-04-11 ENCOUNTER — Encounter: Payer: Self-pay | Admitting: Endocrinology

## 2014-04-11 VITALS — BP 108/60 | HR 93 | Temp 98.3°F | Ht 66.0 in | Wt 181.0 lb

## 2014-04-11 DIAGNOSIS — E119 Type 2 diabetes mellitus without complications: Secondary | ICD-10-CM

## 2014-04-11 NOTE — Patient Instructions (Addendum)
Please change the welchol to "cycloset."  It has possible side effects of nausea and dizziness.  These go away with time.  You can avoid these by taking it at bedtime. Here are some samples.  If you need a prescription, we can change to a similar generic, at a slightly different strength. check your blood sugar 1 time a day.  vary the time of day when you check, between before the 3 meals, and at bedtime.  also check if you have symptoms of your blood sugar being too high or too low.  please keep a record of the readings and bring it to your next appointment here.  please call us sooner if your blood sugar goes below 70, or if it stays over 200.   Please come back for a follow-up appointment in 6 months.

## 2014-04-11 NOTE — Progress Notes (Signed)
Subjective:    Patient ID: [name] White, female    DOB: 03/02/1941, 73 y.o.   MRN: [id number]  HPI Pt returns for f/u of type 2 DM (dx'ed 1999, after an episode of pancreatitis (no pancreatitis since then)--no known complications; she is on 3 oral agents+victoza; she has never taken insulin).  pt states she feels well in general.  no cbg record, but states cbg's are well-controlled.  pt states she feels well in general, except for nocturnal leg cramps.  She says she is having trouble affording welchol. Past Medical History  Diagnosis Date  . Varicose veins   . 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 HYPOTHYROIDISM 09/14/2007  . HYPERCHOLESTEROLEMIA 02/09/2009  . DIABETES MELLITUS, TYPE II 09/14/2007  . MIGRAINE HEADACHE 09/14/2007  . HYPERTENSION 02/09/2009  . SUPERFICIAL PHLEBITIS 09/14/2007  . PANCREATITIS 09/14/2007  . OSTEOPOROSIS 02/09/2009  . ASYMPTOMATIC POSTMENOPAUSAL STATUS 02/09/2009  . PONV (postoperative nausea and vomiting)   . Shortness of breath   . Asthma   . Neuropathy   . Rectocele 03/19/2013  . Constipation 03/19/2013    Past Surgical History  Procedure Laterality Date  . Abdominal hysterectomy    . Nm esophageal reflux  08-11-11  . Transphenoidal / transnasal hypophysectomy / resection pituitary tumor  08-11-11  . Cholecystectomy    . Pituitary excision  10/1997  . Endovenous ablation saphenous vein w/ laser  11-03-2011   right greater saphenous vein    left leg done 10-2011  . Eye surgery  98    right cataract extraction 98  . Cataract extraction w/phaco  12/05/2011    Procedure: CATARACT EXTRACTION PHACO AND INTRAOCULAR LENS PLACEMENT (IOC);  Surgeon: Tonny Branch;  Location: [facility];  Service: Ophthalmology;  Laterality: Left;  CDE:9.65  . Posterior repair    . Cystoscopy    . Laparoscopic nissen fundoplication    . Anterior and posterior repair N/A 04/02/2013    Procedure: ANTERIOR (CYSTOCELE) AND POSTERIOR REPAIR (RECTOCELE);  Surgeon: Jonnie Kind, MD;  Location: [facility];  Service: Gynecology;  Laterality: N/A;    History   Social History  . Marital Status: Married    Spouse Name: N/A    Number of Children: N/A  . Years of Education: N/A   Occupational History  . Retired    Social History Main Topics  . Smoking status: Never Smoker   . Smokeless tobacco: Never Used  . Alcohol Use: No  . Drug Use: No  . Sexual Activity: Yes    Birth Control/ Protection: Surgical   Other Topics Concern  . Not on file   Social History Narrative  . No narrative on file    Current Outpatient Prescriptions on File Prior to Visit  Medication Sig Dispense Refill  . ACCU-CHEK AVIVA PLUS test strip       . albuterol (PROVENTIL HFA;VENTOLIN HFA) 108 (90 BASE) MCG/ACT inhaler Inhale 2 puffs into the lungs every 4 (four) hours as needed.        . beclomethasone (QVAR) 80 MCG/ACT inhaler Inhale 2 puffs into the lungs 2 (two) times daily.        . cycloSPORINE (RESTASIS) 0.05 % ophthalmic emulsion Place 1 drop into both eyes 2 (two) times daily.        Marland Kitchen estradiol (ESTRACE VAGINAL) 0.1 MG/GM vaginal cream Place 3.41 Applicatorfuls vaginally daily.  42.5 g  12  . estradiol (ESTRACE) 1 MG tablet TAKE ONE (1) TABLET EACH DAY  90 tablet  0  . HYDROcodone-acetaminophen (  VICODIN) 5-500 MG per tablet Take 1 tablet by mouth every 4 (four) hours as needed. For pain      . Insulin Pen Needle (NOVOTWIST) 32G X 5 MM MISC Use as directed once a day.   V250.00  100 each  6  . levothyroxine (SYNTHROID, LEVOTHROID) 50 MCG tablet Take 1 tablet (50 mcg total) by mouth daily.  30 tablet  8  . lisinopril (PRINIVIL,ZESTRIL) 5 MG tablet Take 5 mg by mouth every morning.        . metFORMIN (GLUCOPHAGE-XR) 500 MG 24 hr tablet TAKE TWO TABLETS BY MOUTH DAILY  60 tablet  2  . Multiple Vitamins-Minerals (MULTIVITAMINS THER. W/MINERALS) TABS Take 1 tablet by mouth every morning.        Marland Kitchen NOVOFINE 32G X 6 MM MISC USE ONCE DAILY  100 each  3  . omeprazole (PRILOSEC) 20 MG capsule Take 20 mg by  mouth every morning.       . pioglitazone (ACTOS) 45 MG tablet TAKE 1 TABLET BY MOUTH ONCE DAILY  90 tablet  3  . pregabalin (LYRICA) 150 MG capsule Take 150 mg by mouth 2 (two) times daily.       . rizatriptan (MAXALT-MLT) 10 MG disintegrating tablet Take 10 mg by mouth daily as needed. May repeat in 2 hours if needed      . simvastatin (ZOCOR) 80 MG tablet TAKE ONE (1) TABLET EACH DAY  30 tablet  0  . venlafaxine (EFFEXOR-XR) 150 MG 24 hr capsule Take 150 mg by mouth every morning.        No current facility-administered medications on file prior to visit.    Allergies  Allergen Reactions  . Betadine [Povidone Iodine] Hives  . Penicillins Rash    Family History  Problem Relation Age of Onset  . Cancer Neg Hx   . Anesthesia problems Neg Hx   . Hypotension Neg Hx   . Malignant hyperthermia Neg Hx   . Pseudochol deficiency Neg Hx   . Heart disease Mother   . Asthma Mother   . COPD Father   . Heart disease Father   . Asthma Daughter   . Migraines Daughter   . Neuropathy Son     BP 108/60  Pulse 93  Temp(Src) 98.3 F (36.8 C) (Oral)  Ht 5\' 6"  (1.676 m)  Wt 181 lb (82.101 kg)  BMI 29.23 kg/m2  SpO2 96%  Review of Systems She denies hypoglycemia and weight change.    Objective:   Physical Exam VITAL SIGNS:  See vs page GENERAL: no distress   outside test results are reviewed: A1c=6.4    Assessment & Plan:  Type 2 DM: well-controlled Pancreatitis, by hx.  Because she is tolerating victoza well, she can continue for now.   economic circumstances: i can work around these.

## 2014-05-07 ENCOUNTER — Other Ambulatory Visit: Payer: Self-pay | Admitting: Endocrinology

## 2014-05-21 ENCOUNTER — Other Ambulatory Visit: Payer: Self-pay | Admitting: Endocrinology

## 2014-06-03 ENCOUNTER — Encounter: Payer: Self-pay | Admitting: Endocrinology

## 2014-06-04 ENCOUNTER — Other Ambulatory Visit: Payer: Self-pay | Admitting: Endocrinology

## 2014-06-04 ENCOUNTER — Other Ambulatory Visit: Payer: Self-pay | Admitting: Adult Health

## 2014-06-09 ENCOUNTER — Encounter: Payer: Self-pay | Admitting: Family Medicine

## 2014-06-13 ENCOUNTER — Other Ambulatory Visit: Payer: Self-pay | Admitting: Endocrinology

## 2014-06-15 ENCOUNTER — Encounter: Payer: Self-pay | Admitting: Endocrinology

## 2014-06-19 ENCOUNTER — Other Ambulatory Visit: Payer: Self-pay | Admitting: Endocrinology

## 2014-06-19 ENCOUNTER — Encounter: Payer: Self-pay | Admitting: Cardiovascular Disease

## 2014-06-19 ENCOUNTER — Ambulatory Visit (INDEPENDENT_AMBULATORY_CARE_PROVIDER_SITE_OTHER): Payer: Medicare Other | Admitting: Cardiovascular Disease

## 2014-06-19 VITALS — BP 118/72 | HR 93 | Ht 66.0 in | Wt 181.0 lb

## 2014-06-19 DIAGNOSIS — R079 Chest pain, unspecified: Secondary | ICD-10-CM

## 2014-06-19 DIAGNOSIS — E7800 Pure hypercholesterolemia, unspecified: Secondary | ICD-10-CM

## 2014-06-19 DIAGNOSIS — I471 Supraventricular tachycardia: Secondary | ICD-10-CM

## 2014-06-19 DIAGNOSIS — E119 Type 2 diabetes mellitus without complications: Secondary | ICD-10-CM

## 2014-06-19 DIAGNOSIS — I10 Essential (primary) hypertension: Secondary | ICD-10-CM

## 2014-06-19 DIAGNOSIS — R002 Palpitations: Secondary | ICD-10-CM

## 2014-06-19 DIAGNOSIS — E039 Hypothyroidism, unspecified: Secondary | ICD-10-CM

## 2014-06-19 DIAGNOSIS — Z1329 Encounter for screening for other suspected endocrine disorder: Secondary | ICD-10-CM

## 2014-06-19 MED ORDER — CARVEDILOL 3.125 MG PO TABS: 3.1250 mg | ORAL_TABLET | Freq: Two times a day (BID) | ORAL | Status: DC

## 2014-06-19 NOTE — Assessment & Plan Note (Signed)
Clinically she likely had short bout of atrial tachycardia.  Looking back at her records she runs high 80's low 90's all the time Given poor control of DM may have some autonomic dysfunction  Trial of coreg 3.125 bid  F/U echo to make sure EF normal  F/U ETT given atypical pain she had with tachycardia

## 2014-06-19 NOTE — Patient Instructions (Signed)
Your physician recommends that you schedule a follow-up appointment in: 3 months    Your physician has recommended you make the following change in your medication:   START Coreg 3.125 mg twice a day  Lease get blood work today ([place] T4)    Your physician has requested that you have an exercise tolerance test. For further information please visit [email or web address]. Please also follow instruction sheet, as given.    Your physician has requested that you have an echocardiogram. Echocardiography is a painless test that uses sound waves to create images of your heart. It provides your doctor with information about the size and shape of your heart and how well your heart's chambers and valves are working. This procedure takes approximately one hour. There are no restrictions for this procedure.       Thank you for choosing [facility] !

## 2014-06-19 NOTE — Assessment & Plan Note (Signed)
Cholesterol is at goal.  Continue current dose of statin and diet Rx.  No myalgias or side effects.  F/U  LFT's in 6 months. Lab Results  Component Value Date   LDLCALC 58 01/31/2011   Labs with Dr Luan Pulling continue statin given DM

## 2014-06-19 NOTE — Assessment & Plan Note (Signed)
>>  ASSESSMENT AND PLAN FOR DIABETES ([facility]) WRITTEN ON 06/19/2014  1:46 PM BY DELFORD MAUDE BROCKS, MD  Discussed low carb diet.  Target hemoglobin A1c is 6.5 or less.  Continue current medications.

## 2014-06-19 NOTE — Assessment & Plan Note (Signed)
Recent TSH normal continue replacement

## 2014-06-19 NOTE — Assessment & Plan Note (Signed)
Discussed low carb diet.  Target hemoglobin A1c is 6.5 or less.  Continue current medications.  

## 2014-06-19 NOTE — Assessment & Plan Note (Signed)
Well controlled.  Continue current medications and low sodium Dash type diet.    

## 2014-06-19 NOTE — Progress Notes (Signed)
Patient ID: [name] White, female   DOB: 10/23/1941, 73 y.o.   MRN: [id number]  73 yo referred for palpitations.  By Dr Luan Pulling  No history of heart issues.  Was sitting at house and had onset of rapid palpitations.  EMS indicated HR 200 Rhythm broke before going to ER.  No recurrence.  Had associated pain i nface, chest and radiating down both arms  TSH, CBC and BMET normal except mildly elevated BS  CRF DM poor diet on multiple meds  No dyspnea or syncope  Does not check BS except in am  She denies history of frequent palpitations         ROS: Denies fever, malais, weight loss, blurry vision, decreased visual acuity, cough, sputum, SOB, hemoptysis, pleuritic pain, palpitaitons, heartburn, abdominal pain, melena, lower extremity edema, claudication, or rash.  All other systems reviewed and negative   General: Affect appropriate Healthy:  appears stated age 10: normal Neck supple with no adenopathy JVP normal no bruits no thyromegaly Lungs clear with no wheezing and good diaphragmatic motion Heart:  S1/S2 no murmur,rub, gallop or click PMI normal Abdomen: benighn, BS positve, no tenderness, no AAA no bruit.  No HSM or HJR Distal pulses intact with no bruits No edema Neuro non-focal Skin warm and dry No muscular weakness  Medications Current Outpatient Prescriptions  Medication Sig Dispense Refill  . ACCU-CHEK AVIVA PLUS test strip       . albuterol (PROVENTIL HFA;VENTOLIN HFA) 108 (90 BASE) MCG/ACT inhaler Inhale 2 puffs into the lungs every 4 (four) hours as needed.        . beclomethasone (QVAR) 80 MCG/ACT inhaler Inhale 2 puffs into the lungs 2 (two) times daily.        . Bromocriptine Mesylate (CYCLOSET) 0.8 MG TABS Take 0.8 mg by mouth daily.      . cycloSPORINE (RESTASIS) 0.05 % ophthalmic emulsion Place 1 drop into both eyes 2 (two) times daily.        Marland Kitchen estradiol (ESTRACE) 1 MG tablet TAKE ONE (1) TABLET EACH DAY  90 tablet  0  . Hydrocodone-Acetaminophen 2.5-325 MG  TABS Take 5-325 mg by mouth as needed.      . Insulin Pen Needle (NOVOTWIST) 32G X 5 MM MISC Use as directed once a day.   V250.00  100 each  6  . levothyroxine (SYNTHROID, LEVOTHROID) 50 MCG tablet TAKE ONE (1) TABLET EACH DAY  30 tablet  3  . Liraglutide 18 MG/3ML SOPN Inject 1.8 mg into the skin daily.      Marland Kitchen lisinopril (PRINIVIL,ZESTRIL) 5 MG tablet Take 5 mg by mouth every morning.        . Magnesium 100 MG CAPS Take 100 mg by mouth once.      . metFORMIN (GLUCOPHAGE-XR) 500 MG 24 hr tablet TAKE TWO TABLETS BY MOUTH DAILY  60 tablet  2  . Multiple Vitamins-Minerals (MULTIVITAMINS THER. W/MINERALS) TABS Take 1 tablet by mouth every morning.        Marland Kitchen NOVOFINE 32G X 6 MM MISC USE ONCE DAILY  100 each  3  . omeprazole (PRILOSEC) 20 MG capsule Take 20 mg by mouth every morning.       . pioglitazone (ACTOS) 45 MG tablet TAKE 1 TABLET BY MOUTH ONCE DAILY  90 tablet  3  . pregabalin (LYRICA) 150 MG capsule Take 150 mg by mouth 2 (two) times daily.       . rizatriptan (MAXALT-MLT) 10 MG disintegrating tablet Take 10 mg  by mouth daily as needed. May repeat in 2 hours if needed      . simvastatin (ZOCOR) 80 MG tablet TAKE ONE (1) TABLET EACH DAY  30 tablet  3  . venlafaxine (EFFEXOR-XR) 150 MG 24 hr capsule Take 150 mg by mouth every morning.       Marland Kitchen VICTOZA 18 MG/3ML SOPN INJECT 1.8MG  INTO THE SKIN DAILY  9 mL  0  . carvedilol (COREG) 3.125 MG tablet Take 1 tablet (3.125 mg total) by mouth 2 (two) times daily.  180 tablet  3  . metFORMIN (GLUCOPHAGE-XR) 500 MG 24 hr tablet TAKE TWO TABLETS BY MOUTH DAILY  60 tablet  2   No current facility-administered medications for this visit.    Allergies Betadine and Penicillins  Family History: Family History  Problem Relation Age of Onset  . Cancer Neg Hx   . Anesthesia problems Neg Hx   . Hypotension Neg Hx   . Malignant hyperthermia Neg Hx   . Pseudochol deficiency Neg Hx   . Heart disease Mother   . Asthma Mother   . COPD Father   . Heart  disease Father   . Asthma Daughter   . Migraines Daughter   . Neuropathy Son     Social History: History   Social History  . Marital Status: Married    Spouse Name: N/A    Number of Children: N/A  . Years of Education: N/A   Occupational History  . Retired    Social History Main Topics  . Smoking status: Never Smoker   . Smokeless tobacco: Never Used  . Alcohol Use: No  . Drug Use: No  . Sexual Activity: Yes    Birth Control/ Protection: Surgical   Other Topics Concern  . Not on file   Social History Narrative  . No narrative on file    Electrocardiogram:  NSR normal ECG done [facility]   Assessment and Plan

## 2014-06-20 ENCOUNTER — Encounter: Payer: Self-pay | Admitting: Cardiovascular Disease

## 2014-06-20 LAB — TSH: TSH: 1.561 u[IU]/mL (ref 0.350–4.500)

## 2014-06-20 LAB — T4, FREE: FREE T4: 0.91 ng/dL (ref 0.80–1.80)

## 2014-06-25 ENCOUNTER — Encounter (HOSPITAL_COMMUNITY): Payer: Self-pay

## 2014-06-25 ENCOUNTER — Ambulatory Visit (HOSPITAL_COMMUNITY)
Admission: RE | Admit: 2014-06-25 | Discharge: 2014-06-25 | Disposition: A | Discharge status: Home / Self Care | Payer: Medicare Other | Source: Ambulatory Visit | Attending: Cardiovascular Disease | Admitting: Cardiovascular Disease

## 2014-06-25 ENCOUNTER — Encounter: Payer: Self-pay | Admitting: Cardiology

## 2014-06-25 DIAGNOSIS — I10 Essential (primary) hypertension: Secondary | ICD-10-CM | POA: Insufficient documentation

## 2014-06-25 DIAGNOSIS — R002 Palpitations: Secondary | ICD-10-CM

## 2014-06-25 DIAGNOSIS — E119 Type 2 diabetes mellitus without complications: Secondary | ICD-10-CM | POA: Insufficient documentation

## 2014-06-25 DIAGNOSIS — I059 Rheumatic mitral valve disease, unspecified: Secondary | ICD-10-CM

## 2014-06-25 DIAGNOSIS — R0602 Shortness of breath: Secondary | ICD-10-CM

## 2014-06-25 DIAGNOSIS — R079 Chest pain, unspecified: Secondary | ICD-10-CM

## 2014-06-25 NOTE — Progress Notes (Signed)
Stress Lab Nurses Notes - [name]   [name] White  06/25/2014  Reason for doing test: Chest Pain and Palpitations  Type of test: Regular GTX  Nurse performing test: Gerrit Halls, RN  MD performing test: S. Anis Degidio/M.Bonnell Public PA  Family MD: Luan Pulling  Test explained and consent signed: Yes.  IV started: No IV started  Symptoms: SOB  Treatment/Intervention: None  Reason test stopped: Fatigue  After recovery IV was: NA  Patient discharged: Home  Patient's Condition upon discharge was: Stable  Comments: During test peak BP 153/82 & HR 146. Recovery BP 115/76 & HR 86. Symptoms resolved in recovery.  [name]  Attending note:  Patient exercised on Bruce protocol for 6:10 reaching 7.4 METS, peak heart rate 146 BPM, 98% MPHR. Peak blood pressure 153/82. No chest pain reported. No diagnostic ST segment abnormalities, lead motion artifact noted. No arrhythmias. Low risk Duke treadmill score of 6.  Satira Sark, M.D., F.A.C.C.

## 2014-06-25 NOTE — Progress Notes (Signed)
Stress Lab Nurses Notes - [name]  KAEDENCE CONNELLY 06/25/2014 Reason for doing test: Chest Pain and Palpitations Type of test: Regular GTX Nurse performing test: Gerrit Halls, RN Nuclear Medicine Tech: Not Applicable Echo Tech: Not Applicable MD performing test: S. McDowell/M.Bonnell Public PA Family MD: Luan Pulling Test explained and consent signed: Yes.   IV started: No IV started Symptoms: SOB Treatment/Intervention: None Reason test stopped: fatigue After recovery IV was: NA Patient to return to [facility]. Med at : NA Patient discharged: Home Patient's Condition upon discharge was: stable Comments: During test peak BP 153/82 & HR 146.  Recovery BP 115/76 & HR 86.  Symptoms resolved in recovery.  [name]

## 2014-06-25 NOTE — Progress Notes (Signed)
*  PRELIMINARY RESULTS* Echocardiogram 2D Echocardiogram has been performed.  Leavy Cella 06/25/2014, 9:59 AM

## 2014-06-29 NOTE — Progress Notes (Signed)
Normal ETT 

## 2014-06-30 NOTE — Progress Notes (Signed)
PT  AWARE OF  GXT  RESULTS ./CY 

## 2014-07-10 ENCOUNTER — Other Ambulatory Visit: Payer: Self-pay | Admitting: Endocrinology

## 2014-08-14 ENCOUNTER — Encounter: Payer: Self-pay | Admitting: Endocrinology

## 2014-08-14 ENCOUNTER — Other Ambulatory Visit: Payer: Self-pay

## 2014-08-14 ENCOUNTER — Encounter: Payer: Self-pay | Admitting: Cardiology

## 2014-08-14 MED ORDER — BROMOCRIPTINE MESYLATE 0.8 MG PO TABS: 0.8000 mg | ORAL_TABLET | Freq: Every day | ORAL | Status: DC

## 2014-08-15 ENCOUNTER — Telehealth: Payer: Self-pay | Admitting: Endocrinology

## 2014-08-15 MED ORDER — BROMOCRIPTINE MESYLATE 2.5 MG PO TABS: 1.2500 mg | ORAL_TABLET | Freq: Every day | ORAL | Status: DC

## 2014-08-15 NOTE — Telephone Encounter (Signed)
please call patient: i got PA form for cycloset.   Instead, i'll change to generic, even though the strength is not exactly the same--don't worry about this. i'll see you next time.   Please note--It is not safe to take maxalt while on this.

## 2014-08-15 NOTE — Telephone Encounter (Signed)
Pt advised of medication change. Pt states she will begin new med.

## 2014-08-17 ENCOUNTER — Other Ambulatory Visit: Payer: Self-pay | Admitting: Endocrinology

## 2014-08-20 ENCOUNTER — Other Ambulatory Visit: Payer: Self-pay | Admitting: Endocrinology

## 2014-08-20 ENCOUNTER — Encounter: Payer: Self-pay | Admitting: Endocrinology

## 2014-08-20 DIAGNOSIS — E119 Type 2 diabetes mellitus without complications: Secondary | ICD-10-CM

## 2014-08-27 ENCOUNTER — Other Ambulatory Visit: Payer: Self-pay | Admitting: Adult Health

## 2014-08-28 ENCOUNTER — Encounter: Payer: Self-pay | Admitting: Endocrinology

## 2014-09-02 ENCOUNTER — Encounter: Payer: Self-pay | Admitting: Endocrinology

## 2014-09-02 ENCOUNTER — Ambulatory Visit (INDEPENDENT_AMBULATORY_CARE_PROVIDER_SITE_OTHER): Payer: Medicare Other | Admitting: Endocrinology

## 2014-09-02 VITALS — BP 122/76 | HR 86 | Temp 97.4°F | Ht 66.0 in | Wt 186.0 lb

## 2014-09-02 DIAGNOSIS — E119 Type 2 diabetes mellitus without complications: Secondary | ICD-10-CM

## 2014-09-02 LAB — MICROALBUMIN / CREATININE URINE RATIO
Creatinine,U: 71.2 mg/dL
Microalb Creat Ratio: 1.5 mg/g (ref 0.0–30.0)
Microalb, Ur: 1.1 mg/dL (ref 0.0–1.9)

## 2014-09-02 LAB — HEMOGLOBIN A1C: HEMOGLOBIN A1C: 6.7 % — AB (ref 4.6–6.5)

## 2014-09-02 LAB — LIPID PANEL
CHOLESTEROL: 126 mg/dL (ref 0–200)
HDL: 50.3 mg/dL (ref >39.00)
LDL Cholesterol: 52 mg/dL (ref 0–99)
NonHDL: 75.7
Total CHOL/HDL Ratio: 3
Triglycerides: 117 mg/dL (ref 0.0–149.0)
VLDL: 23.4 mg/dL (ref 0.0–40.0)

## 2014-09-02 NOTE — Patient Instructions (Signed)
blood tests are being requested for you today.  We'll contact you with results. If it is good, please stop taking the bromocriptine, due to an interaction with maxalt.  i'll let you know.   check your blood sugar 1 time a day.  vary the time of day when you check, between before the 3 meals, and at bedtime.  also check if you have symptoms of your blood sugar being too high or too low.  please keep a record of the readings and bring it to your next appointment here.  please call us sooner if your blood sugar goes below 70, or if it stays over 200.   Please come back for a follow-up appointment in 6 months.

## 2014-09-02 NOTE — Progress Notes (Signed)
Subjective:    Patient ID: [name] White, female    DOB: Aug 31, 1941, 73 y.o.   MRN: [id number]  HPI The state of at least three ongoing medical problems is addressed today, with interval history of each noted here: Pt returns for f/u of type 2 DM (dx'ed 1999, after an episode of pancreatitis (no pancreatitis since then)--no known complications; she is on 3 oral agents+victoza; she could not afford welchol; she has never taken insulin).  pt states she feels well in general.  no cbg record, but states cbg's are well-controlled.  pt states she feels well in general, except for nocturnal leg cramps.   Dyslipidemia: she tolerates zocor well. Headache: she says maxalt works well, and she would like to continue. Past Medical History  Diagnosis Date  . Varicose veins   . 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 HYPOTHYROIDISM 09/14/2007  . HYPERCHOLESTEROLEMIA 02/09/2009  . DIABETES MELLITUS, TYPE II 09/14/2007  . MIGRAINE HEADACHE 09/14/2007  . HYPERTENSION 02/09/2009  . SUPERFICIAL PHLEBITIS 09/14/2007  . PANCREATITIS 09/14/2007  . OSTEOPOROSIS 02/09/2009  . ASYMPTOMATIC POSTMENOPAUSAL STATUS 02/09/2009  . PONV (postoperative nausea and vomiting)   . Shortness of breath   . Asthma   . Neuropathy   . Rectocele 03/19/2013  . Constipation 03/19/2013    Past Surgical History  Procedure Laterality Date  . Abdominal hysterectomy    . Nm esophageal reflux  08-11-11  . Transphenoidal / transnasal hypophysectomy / resection pituitary tumor  08-11-11  . Cholecystectomy    . Pituitary excision  10/1997  . Endovenous ablation saphenous vein w/ laser  11-03-2011   right greater saphenous vein    left leg done 10-2011  . Eye surgery  98    right cataract extraction 98  . Cataract extraction w/phaco  12/05/2011    Procedure: CATARACT EXTRACTION PHACO AND INTRAOCULAR LENS PLACEMENT (IOC);  Surgeon: Tonny Branch;  Location: [facility];  Service: Ophthalmology;  Laterality: Left;  CDE:9.65  . Posterior repair    .  Cystoscopy    . Laparoscopic nissen fundoplication    . Anterior and posterior repair N/A 04/02/2013    Procedure: ANTERIOR (CYSTOCELE) AND POSTERIOR REPAIR (RECTOCELE);  Surgeon: Jonnie Kind, MD;  Location: [facility];  Service: Gynecology;  Laterality: N/A;    History   Social History  . Marital Status: Married    Spouse Name: N/A    Number of Children: N/A  . Years of Education: N/A   Occupational History  . Retired    Social History Main Topics  . Smoking status: Never Smoker   . Smokeless tobacco: Never Used  . Alcohol Use: No  . Drug Use: No  . Sexual Activity: Yes    Birth Control/ Protection: Surgical   Other Topics Concern  . Not on file   Social History Narrative  . No narrative on file    Current Outpatient Prescriptions on File Prior to Visit  Medication Sig Dispense Refill  . ACCU-CHEK AVIVA PLUS test strip       . albuterol (PROVENTIL HFA;VENTOLIN HFA) 108 (90 BASE) MCG/ACT inhaler Inhale 2 puffs into the lungs every 4 (four) hours as needed.        . beclomethasone (QVAR) 80 MCG/ACT inhaler Inhale 2 puffs into the lungs 2 (two) times daily.        . bromocriptine (PARLODEL) 2.5 MG tablet Take 0.5 tablets (1.25 mg total) by mouth daily.  15 tablet  11  . carvedilol (COREG) 3.125 MG tablet Take 1  tablet (3.125 mg total) by mouth 2 (two) times daily.  180 tablet  3  . cycloSPORINE (RESTASIS) 0.05 % ophthalmic emulsion Place 1 drop into both eyes 2 (two) times daily.        Marland Kitchen estradiol (ESTRACE) 1 MG tablet TAKE ONE (1) TABLET EACH DAY  30 tablet  0  . Hydrocodone-Acetaminophen 2.5-325 MG TABS Take 5-325 mg by mouth as needed.      . Insulin Pen Needle (NOVOTWIST) 32G X 5 MM MISC Use as directed once a day.   V250.00  100 each  6  . levothyroxine (SYNTHROID, LEVOTHROID) 50 MCG tablet TAKE ONE (1) TABLET EACH DAY  30 tablet  3  . Liraglutide 18 MG/3ML SOPN Inject 1.8 mg into the skin daily.      Marland Kitchen lisinopril (PRINIVIL,ZESTRIL) 5 MG tablet Take 5 mg by mouth  every morning.        . Magnesium 100 MG CAPS Take 100 mg by mouth once.      . metFORMIN (GLUCOPHAGE-XR) 500 MG 24 hr tablet TAKE TWO TABLETS BY MOUTH DAILY  60 tablet  2  . metFORMIN (GLUCOPHAGE-XR) 500 MG 24 hr tablet TAKE TWO TABLETS BY MOUTH DAILY  60 tablet  2  . Multiple Vitamins-Minerals (MULTIVITAMINS THER. W/MINERALS) TABS Take 1 tablet by mouth every morning.        Marland Kitchen NOVOFINE 32G X 6 MM MISC USE ONCE DAILY  100 each  3  . omeprazole (PRILOSEC) 20 MG capsule Take 20 mg by mouth every morning.       . pioglitazone (ACTOS) 45 MG tablet TAKE 1 TABLET BY MOUTH ONCE DAILY  90 tablet  3  . pregabalin (LYRICA) 150 MG capsule Take 150 mg by mouth 2 (two) times daily.       . rizatriptan (MAXALT-MLT) 10 MG disintegrating tablet Take 10 mg by mouth daily as needed. May repeat in 2 hours if needed      . simvastatin (ZOCOR) 80 MG tablet TAKE ONE (1) TABLET EACH DAY  30 tablet  3  . venlafaxine (EFFEXOR-XR) 150 MG 24 hr capsule Take 150 mg by mouth every morning.       Marland Kitchen VICTOZA 18 MG/3ML SOPN INJECT 1.8MG  DAILY  9 mL  1   No current facility-administered medications on file prior to visit.    Allergies  Allergen Reactions  . Betadine [Povidone Iodine] Hives  . Penicillins Rash    Family History  Problem Relation Age of Onset  . Cancer Neg Hx   . Anesthesia problems Neg Hx   . Hypotension Neg Hx   . Malignant hyperthermia Neg Hx   . Pseudochol deficiency Neg Hx   . Heart disease Mother   . Asthma Mother   . COPD Father   . Heart disease Father   . Asthma Daughter   . Migraines Daughter   . Neuropathy Son     BP 122/76  Pulse 86  Temp(Src) 97.4 F (36.3 C) (Oral)  Ht 5\' 6"  (1.676 m)  Wt 186 lb (84.369 kg)  BMI 30.04 kg/m2  SpO2 96%   Review of Systems She denies hypoglycemia and weight loss.     Objective:   Physical Exam VITAL SIGNS:  See vs page GENERAL: no distress Pulses: dorsalis pedis intact bilat.   Feet: no deformity. normal color and temp.  no edema.   Few bilat varicosities. Skin:  no ulcer on the feet.   Neuro: sensation is intact to touch on the feet  Lab Results  Component Value Date   HGBA1C 6.7* 09/02/2014       Assessment & Plan:  DM: well-controlled Dyslipidemia: well-controlled Headache: there is an interaction between maxalt and bromocriptine.  Patient is advised the following: Patient Instructions  blood tests are being requested for you today.  We'll contact you with results. If it is good, please stop taking the bromocriptine, due to an interaction with maxalt.  i'll let you know.   check your blood sugar 1 time a day.  vary the time of day when you check, between before the 3 meals, and at bedtime.  also check if you have symptoms of your blood sugar being too high or too low.  please keep a record of the readings and bring it to your next appointment here.  please call us sooner if your blood sugar goes below 70, or if it stays over 200.   Please come [facility] for a follow-up appointment in 6 months.

## 2014-09-05 ENCOUNTER — Other Ambulatory Visit: Payer: Self-pay | Admitting: Endocrinology

## 2014-09-08 ENCOUNTER — Other Ambulatory Visit: Payer: Self-pay | Admitting: Endocrinology

## 2014-09-10 ENCOUNTER — Ambulatory Visit (INDEPENDENT_AMBULATORY_CARE_PROVIDER_SITE_OTHER): Payer: Medicare Other | Admitting: Cardiology

## 2014-09-10 ENCOUNTER — Encounter: Payer: Self-pay | Admitting: Cardiology

## 2014-09-10 VITALS — BP 108/68 | HR 89 | Ht 66.0 in | Wt 189.0 lb

## 2014-09-10 DIAGNOSIS — E785 Hyperlipidemia, unspecified: Secondary | ICD-10-CM

## 2014-09-10 DIAGNOSIS — I10 Essential (primary) hypertension: Secondary | ICD-10-CM

## 2014-09-10 DIAGNOSIS — R002 Palpitations: Secondary | ICD-10-CM

## 2014-09-10 MED ORDER — ATORVASTATIN CALCIUM 40 MG PO TABS: 40.0000 mg | ORAL_TABLET | Freq: Every day | ORAL | Status: DC

## 2014-09-10 NOTE — Progress Notes (Signed)
Clinical Summary [name] White is a 73 y.o.female last seen by Dr Johnsie Cancel, this is our first visit together. She is seen for the following medical problems.  1. Palpitations - from last clinic note patient with episode of palpitations at that time, from EMS report HR at 200 that broke prior to arriving at ER.  - started on coreg 3.125mg  bid - echo 06/2014 LVEF 60-65%, grade II diastolic dysfunction - 12/1939 ETT went 6 minutes, 7.4METs, 98% THR, no chest, no ischemic changes.   - no recurrent symptoms. Tolerating beta blocker well.   2. HTN - compliant with meds - checks bp daily, typically pulses in 80-90s with bp 100s/60s  3. Hyperlipidemia - compliant with zocor  Past Medical History  Diagnosis Date  . Varicose veins   . 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 HYPOTHYROIDISM 09/14/2007  . HYPERCHOLESTEROLEMIA 02/09/2009  . DIABETES MELLITUS, TYPE II 09/14/2007  . MIGRAINE HEADACHE 09/14/2007  . HYPERTENSION 02/09/2009  . SUPERFICIAL PHLEBITIS 09/14/2007  . PANCREATITIS 09/14/2007  . OSTEOPOROSIS 02/09/2009  . ASYMPTOMATIC POSTMENOPAUSAL STATUS 02/09/2009  . PONV (postoperative nausea and vomiting)   . Shortness of breath   . Asthma   . Neuropathy   . Rectocele 03/19/2013  . Constipation 03/19/2013     Allergies  Allergen Reactions  . Betadine [Povidone Iodine] Hives  . Penicillins Rash     Current Outpatient Prescriptions  Medication Sig Dispense Refill  . ACCU-CHEK AVIVA PLUS test strip       . albuterol (PROVENTIL HFA;VENTOLIN HFA) 108 (90 BASE) MCG/ACT inhaler Inhale 2 puffs into the lungs every 4 (four) hours as needed.        . beclomethasone (QVAR) 80 MCG/ACT inhaler Inhale 2 puffs into the lungs 2 (two) times daily.        . bromocriptine (PARLODEL) 2.5 MG tablet Take 0.5 tablets (1.25 mg total) by mouth daily.  15 tablet  11  . carvedilol (COREG) 3.125 MG tablet Take 1 tablet (3.125 mg total) by mouth 2 (two) times daily.  180 tablet  3  . cycloSPORINE (RESTASIS) 0.05 %  ophthalmic emulsion Place 1 drop into both eyes 2 (two) times daily.        Marland Kitchen estradiol (ESTRACE) 1 MG tablet TAKE ONE (1) TABLET EACH DAY  30 tablet  0  . Hydrocodone-Acetaminophen 2.5-325 MG TABS Take 5-325 mg by mouth as needed.      . Insulin Pen Needle (NOVOTWIST) 32G X 5 MM MISC Use as directed once a day.   V250.00  100 each  6  . levothyroxine (SYNTHROID, LEVOTHROID) 50 MCG tablet TAKE ONE (1) TABLET EACH DAY  30 tablet  3  . Liraglutide 18 MG/3ML SOPN Inject 1.8 mg into the skin daily.      Marland Kitchen lisinopril (PRINIVIL,ZESTRIL) 5 MG tablet Take 5 mg by mouth every morning.        . Magnesium 100 MG CAPS Take 100 mg by mouth once.      . metFORMIN (GLUCOPHAGE-XR) 500 MG 24 hr tablet TAKE TWO TABLETS BY MOUTH DAILY  60 tablet  2  . metFORMIN (GLUCOPHAGE-XR) 500 MG 24 hr tablet TAKE TWO TABLETS BY MOUTH DAILY  60 tablet  2  . Multiple Vitamins-Minerals (MULTIVITAMINS THER. W/MINERALS) TABS Take 1 tablet by mouth every morning.        Marland Kitchen NOVOFINE 32G X 6 MM MISC USE ONCE DAILY  100 each  3  . omeprazole (PRILOSEC) 20 MG capsule Take 20 mg by mouth  every morning.       . pioglitazone (ACTOS) 45 MG tablet TAKE 1 TABLET BY MOUTH ONCE DAILY  90 tablet  3  . pregabalin (LYRICA) 150 MG capsule Take 150 mg by mouth 2 (two) times daily.       . rizatriptan (MAXALT-MLT) 10 MG disintegrating tablet Take 10 mg by mouth daily as needed. May repeat in 2 hours if needed      . simvastatin (ZOCOR) 80 MG tablet TAKE ONE (1) TABLET EACH DAY  30 tablet  1  . venlafaxine (EFFEXOR-XR) 150 MG 24 hr capsule Take 150 mg by mouth every morning.       Marland Kitchen VICTOZA 18 MG/3ML SOPN INJECT 1.8MG  ONCE DAILY  9 mL  1   No current facility-administered medications for this visit.     Past Surgical History  Procedure Laterality Date  . Abdominal hysterectomy    . Nm esophageal reflux  08-11-11  . Transphenoidal / transnasal hypophysectomy / resection pituitary tumor  08-11-11  . Cholecystectomy    . Pituitary excision   10/1997  . Endovenous ablation saphenous vein w/ laser  11-03-2011   right greater saphenous vein    left leg done 10-2011  . Eye surgery  98    right cataract extraction 98  . Cataract extraction w/phaco  12/05/2011    Procedure: CATARACT EXTRACTION PHACO AND INTRAOCULAR LENS PLACEMENT (IOC);  Surgeon: Tonny Masiya Claassen;  Location: [facility];  Service: Ophthalmology;  Laterality: Left;  CDE:9.65  . Posterior repair    . Cystoscopy    . Laparoscopic nissen fundoplication    . Anterior and posterior repair N/A 04/02/2013    Procedure: ANTERIOR (CYSTOCELE) AND POSTERIOR REPAIR (RECTOCELE);  Surgeon: Jonnie Kind, MD;  Location: [facility];  Service: Gynecology;  Laterality: N/A;     Allergies  Allergen Reactions  . Betadine [Povidone Iodine] Hives  . Penicillins Rash      Family History  Problem Relation Age of Onset  . Cancer Neg Hx   . Anesthesia problems Neg Hx   . Hypotension Neg Hx   . Malignant hyperthermia Neg Hx   . Pseudochol deficiency Neg Hx   . Heart disease Mother   . Asthma Mother   . COPD Father   . Heart disease Father   . Asthma Daughter   . Migraines Daughter   . Neuropathy Son      Social History [name] White reports that she has never smoked. She has never used smokeless tobacco. [name] White reports that she does not drink alcohol.   Review of Systems CONSTITUTIONAL: No weight loss, fever, chills, weakness or fatigue.  HEENT: Eyes: No visual loss, blurred vision, double vision or yellow sclerae.No hearing loss, sneezing, congestion, runny nose or sore throat.  SKIN: No rash or itching.  CARDIOVASCULAR: per HPI RESPIRATORY: No shortness of breath, cough or sputum.  GASTROINTESTINAL: No anorexia, nausea, vomiting or diarrhea. No abdominal pain or blood.  GENITOURINARY: No burning on urination, no polyuria NEUROLOGICAL: No headache, dizziness, syncope, paralysis, ataxia, numbness or tingling in the extremities. No change in bowel or bladder control.    MUSCULOSKELETAL: No muscle, back pain, joint pain or stiffness.  LYMPHATICS: No enlarged nodes. No history of splenectomy.  PSYCHIATRIC: No history of depression or anxiety.  ENDOCRINOLOGIC: No reports of sweating, cold or heat intolerance. No polyuria or polydipsia.  Marland Kitchen   Physical Examination p 89 bp 108/68 Wt 189 lbs BMI 31 Gen: resting comfortably, no acute distress HEENT: no scleral  icterus, pupils equal round and reactive, no palptable cervical adenopathy,  CV: RRR, no m/r/g, no JVD, no carotid bruits Resp: Clear to auscultation bilaterally GI: abdomen is soft, non-tender, non-distended, normal bowel sounds, no hepatosplenomegaly MSK: extremities are warm, no edema.  Skin: warm, no rash Neuro:  no focal deficits Psych: appropriate affect   Diagnostic Studies 06/2014 Echo Study Conclusions  - Left ventricle: The cavity size was normal. Wall thickness was increased in a pattern of mild LVH. Systolic function was normal. The estimated ejection fraction was in the range of 60% to 65%. Wall motion was normal; there were no regional wall motion abnormalities. Features are consistent with a pseudonormal left ventricular filling pattern, with concomitant abnormal relaxation and increased filling pressure (grade 2 diastolic dysfunction). - Aortic valve: There was trivial regurgitation. - Mitral valve: Calcified annulus. There was mild regurgitation. - Left atrium: The atrium was moderately dilated. - Right atrium: Central venous pressure (est): 3 mm Hg. - Tricuspid valve: There was trivial regurgitation. - Pulmonary arteries: Systolic pressure could not be accurately estimated. - Pericardium, extracardiac: There was no pericardial effusion.  Impressions:  - Mild LVH with LVEF 45-36%, grade 2 diastolic dysfunction. Moderate left atrial enlargement. MAC with mild mitral regurgitation. Unable to assess PASP.      Assessment and Plan  1. Palpitations - no recent symptoms,  continue current meds  2. HTN - at goal, continue current meds.   3. Hyperlipidemia - given history of CAD and DM, current guidelines would recommend at least lipitor 40mg  daily. Will stop zocor and start lipitor 40mg  daily   F/u 1 year      Arnoldo Lenis, M.D., F.A.C.C.

## 2014-09-10 NOTE — Patient Instructions (Signed)
Your physician wants you to follow-up in: 1 year with Dr. Bryna Colander will receive a reminder letter in the mail two months in advance. If you don't receive a letter, please call our office to schedule the follow-up appointment.  Your physician has recommended you make the following change in your medication:   STOP ZOCOR  START LIPITOR 40 MG DAILY  START ASPIRIN 81 MG DAILY  Thank you for choosing [facility]!!

## 2014-10-02 ENCOUNTER — Other Ambulatory Visit: Payer: Self-pay | Admitting: Endocrinology

## 2014-10-02 ENCOUNTER — Other Ambulatory Visit: Payer: Self-pay | Admitting: Adult Health

## 2014-10-10 ENCOUNTER — Ambulatory Visit: Payer: Medicare Other | Admitting: Endocrinology

## 2014-10-13 ENCOUNTER — Other Ambulatory Visit (HOSPITAL_COMMUNITY): Payer: Self-pay | Admitting: Pulmonary Disease

## 2014-10-13 DIAGNOSIS — Z1231 Encounter for screening mammogram for malignant neoplasm of breast: Secondary | ICD-10-CM

## 2014-10-15 ENCOUNTER — Ambulatory Visit (HOSPITAL_COMMUNITY)
Admission: RE | Admit: 2014-10-15 | Discharge: 2014-10-15 | Disposition: A | Discharge status: Home / Self Care | Payer: Medicare Other | Source: Ambulatory Visit | Attending: Pulmonary Disease | Admitting: Pulmonary Disease

## 2014-10-15 DIAGNOSIS — Z1231 Encounter for screening mammogram for malignant neoplasm of breast: Secondary | ICD-10-CM

## 2014-11-05 ENCOUNTER — Other Ambulatory Visit: Payer: Self-pay | Admitting: Adult Health

## 2014-11-10 ENCOUNTER — Other Ambulatory Visit: Payer: Self-pay | Admitting: Endocrinology

## 2014-12-03 ENCOUNTER — Other Ambulatory Visit: Payer: Self-pay | Admitting: Adult Health

## 2014-12-10 ENCOUNTER — Ambulatory Visit (INDEPENDENT_AMBULATORY_CARE_PROVIDER_SITE_OTHER): Payer: Medicare Other | Admitting: Obstetrics and Gynecology

## 2014-12-10 ENCOUNTER — Encounter: Payer: Self-pay | Admitting: Obstetrics and Gynecology

## 2014-12-10 VITALS — BP 112/62 | Ht 66.5 in | Wt 191.5 lb

## 2014-12-10 DIAGNOSIS — Z01419 Encounter for gynecological examination (general) (routine) without abnormal findings: Secondary | ICD-10-CM

## 2014-12-10 DIAGNOSIS — Z0000 Encounter for general adult medical examination without abnormal findings: Secondary | ICD-10-CM

## 2014-12-10 MED ORDER — ESTRADIOL 1 MG PO TABS: 1.0000 mg | ORAL_TABLET | Freq: Every day | ORAL | Status: DC

## 2014-12-10 NOTE — Progress Notes (Signed)
Assessment:  Annual Gyn Exam   Plan:  1. next annual due 2 years 2. return annually or prn 3    Annual mammogram advised Subjective:  [name] White is a 73 y.o. female No obstetric history on file. who presents for annual exam. No LMP recorded. Patient has had a hysterectomy. as well as posterior repair, has slight improved defecation , has hemorrhoids The patient has complaints today for her annual exam. Pt states that she has leg cramps at night that start in her inner thigh and go all the way down to her feet. Dr. Luan Pulling informed her to take Magnesium for the cramps. She notes that the cramps wake her up at night. She reports that with her hysterectomy, she had her ovaries left. She denies any other symptoms. Pt PCP is Dr. Luan Pulling. Her last colonoscopy was within the last ten years and she completes them every ten years. Her last mammogram was a couple months ago and was normal. She voices that she does have a hernia in her abdomen.   The following portions of the patient's history were reviewed and updated as appropriate: allergies, current medications, past family history, past medical history, past social history, past surgical history and problem list. Past Medical History  Diagnosis Date  . Varicose veins   . 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 HYPOTHYROIDISM 09/14/2007  . HYPERCHOLESTEROLEMIA 02/09/2009  . DIABETES MELLITUS, TYPE II 09/14/2007  . MIGRAINE HEADACHE 09/14/2007  . HYPERTENSION 02/09/2009  . SUPERFICIAL PHLEBITIS 09/14/2007  . PANCREATITIS 09/14/2007  . OSTEOPOROSIS 02/09/2009  . ASYMPTOMATIC POSTMENOPAUSAL STATUS 02/09/2009  . PONV (postoperative nausea and vomiting)   . Shortness of breath   . Asthma   . Neuropathy   . Rectocele 03/19/2013  . Constipation 03/19/2013    Past Surgical History  Procedure Laterality Date  . Abdominal hysterectomy    . Nm esophageal reflux  08-11-11  . Transphenoidal / transnasal hypophysectomy / resection pituitary tumor  08-11-11  .  Cholecystectomy    . Pituitary excision  10/1997  . Endovenous ablation saphenous vein w/ laser  11-03-2011   right greater saphenous vein    left leg done 10-2011  . Eye surgery  98    right cataract extraction 98  . Cataract extraction w/phaco  12/05/2011    Procedure: CATARACT EXTRACTION PHACO AND INTRAOCULAR LENS PLACEMENT (IOC);  Surgeon: Tonny Branch;  Location: [facility];  Service: Ophthalmology;  Laterality: Left;  CDE:9.65  . Posterior repair    . Cystoscopy    . Laparoscopic nissen fundoplication    . Anterior and posterior repair N/A 04/02/2013    Procedure: ANTERIOR (CYSTOCELE) AND POSTERIOR REPAIR (RECTOCELE);  Surgeon: Jonnie Kind, MD;  Location: [facility];  Service: Gynecology;  Laterality: N/A;    Current outpatient prescriptions: ACCU-CHEK AVIVA PLUS test strip, , Disp: , Rfl: ;  albuterol (PROVENTIL HFA;VENTOLIN HFA) 108 (90 BASE) MCG/ACT inhaler, Inhale 2 puffs into the lungs every 4 (four) hours as needed.  , Disp: , Rfl: ;  aspirin 81 MG tablet, Take 81 mg by mouth daily., Disp: , Rfl: ;  atorvastatin (LIPITOR) 40 MG tablet, Take 1 tablet (40 mg total) by mouth daily., Disp: 90 tablet, Rfl: 3 beclomethasone (QVAR) 80 MCG/ACT inhaler, Inhale 2 puffs into the lungs 2 (two) times daily.  , Disp: , Rfl: ;  carvedilol (COREG) 3.125 MG tablet, Take 1 tablet (3.125 mg total) by mouth 2 (two) times daily., Disp: 180 tablet, Rfl: 3;  cycloSPORINE (RESTASIS) 0.05 % ophthalmic  emulsion, Place 1 drop into both eyes 2 (two) times daily.  , Disp: , Rfl: ;  estradiol (ESTRACE) 1 MG tablet, TAKE ONE (1) TABLET EACH DAY, Disp: 30 tablet, Rfl: 0 Hydrocodone-Acetaminophen 2.5-325 MG TABS, Take 5-325 mg by mouth as needed., Disp: , Rfl: ;  Insulin Pen Needle (NOVOTWIST) 32G X 5 MM MISC, Use as directed once a day.   V250.00, Disp: 100 each, Rfl: 6;  levothyroxine (SYNTHROID, LEVOTHROID) 50 MCG tablet, TAKE ONE (1) TABLET EACH DAY, Disp: 30 tablet, Rfl: 2;  lisinopril (PRINIVIL,ZESTRIL) 5 MG tablet, Take  5 mg by mouth every morning.  , Disp: , Rfl:  Magnesium 100 MG CAPS, Take 100 mg by mouth once., Disp: , Rfl: ;  metFORMIN (GLUCOPHAGE-XR) 500 MG 24 hr tablet, TAKE TWO TABLETS BY MOUTH DAILY, Disp: 60 tablet, Rfl: 2;  Multiple Vitamins-Minerals (MULTIVITAMINS THER. W/MINERALS) TABS, Take 1 tablet by mouth every morning.  , Disp: , Rfl: ;  NOVOFINE 32G X 6 MM MISC, USE ONCE DAILY, Disp: 100 each, Rfl: 3;  omeprazole (PRILOSEC) 20 MG capsule, Take 20 mg by mouth every morning. , Disp: , Rfl:  pioglitazone (ACTOS) 45 MG tablet, TAKE 1 TABLET BY MOUTH ONCE DAILY, Disp: 90 tablet, Rfl: 3;  pregabalin (LYRICA) 150 MG capsule, Take 150 mg by mouth 2 (two) times daily. , Disp: , Rfl: ;  rizatriptan (MAXALT-MLT) 10 MG disintegrating tablet, Take 10 mg by mouth daily as needed. May repeat in 2 hours if needed, Disp: , Rfl: ;  venlafaxine (EFFEXOR-XR) 150 MG 24 hr capsule, Take 150 mg by mouth every morning. , Disp: , Rfl:  VICTOZA 18 MG/3ML SOPN, INJECT 1.8MG  ONCE DAILY, Disp: 9 mL, Rfl: 2  Review of Systems Constitutional: negative Gastrointestinal: negative Genitourinary: negative  Objective:  BP 112/62 mmHg  Ht 5' 6.5" (1.689 m)  Wt 191 lb 8 oz (86.864 kg)  BMI 30.45 kg/m2   BMI: Body mass index is 30.45 kg/(m^2).  General Appearance: Alert, appropriate appearance for age. No acute distress HEENT: Grossly normal Neck / Thyroid: supple, nl ROM Cardiovascular: RRR; normal S1, S2, no murmur Lungs: CTA bilaterally Back: No CVAT Abdominal: 4 cm x 4 cm  Supra-umbilical hernia noted upon exam, nontender, doesn't reduce. Pt aware and monitors it Breast Exam: Normal to inspection, Normal breast tissue bilaterally and No masses or nodes.No dimpling, nipple retraction or discharge. Gastrointestinal: Soft, non-tender, no masses or organomegaly Pelvic Exam: Urinary system: not indicated Vaginal: normal mucosa without prolapse or lesions, normal without tenderness, induration or masses, normal rugae and good  support at the vaginal entrance Cervix: normal appearance Adnexa: normal bimanual exam Uterus: removed surgically Rectovaginal: normal rectal, no masses, LIGHTLY positive guaiac stool obtained  Lymphatic Exam: Non-palpable nodes in neck, clavicular, axillary, or inguinal regions  Skin: no rash or abnormalities Neurologic: Normal gait and speech, no tremor  Psychiatric: Alert and oriented, appropriate affect. Urinalysis:Not done  Assessment:  1. Annual Exam  2. Patient with known hemorrhoids 3. Menopause management: continue estrace.  Plan: 1. 3 hemoccult cards to be given to patient for Lightly postitive guaiac stool obtained. 2. F/U to bring in the hemoccult cards   This chart was scribed for Jonnie Kind, MD by Steva Colder, ED Scribe. The patient was seen in room 1 at 11:18 AM.

## 2014-12-10 NOTE — Progress Notes (Signed)
Patient ID: [name] White, female   DOB: Jul 12, 1941, 73 y.o.   MRN: [id number] Pt here today for annual exam. Pt states that she has leg cramps at night that start in her inner thigh and go all the way down to her feet.

## 2014-12-23 ENCOUNTER — Other Ambulatory Visit (INDEPENDENT_AMBULATORY_CARE_PROVIDER_SITE_OTHER): Payer: Medicare Other

## 2014-12-23 DIAGNOSIS — K921 Melena: Secondary | ICD-10-CM

## 2014-12-23 LAB — HEMOCCULT GUIAC POC 1CARD (OFFICE)
Card #2 Fecal Occult Blod, POC: NEGATIVE
Card #3 Fecal Occult Blood, POC: NEGATIVE
Fecal Occult Blood, POC: NEGATIVE

## 2014-12-23 NOTE — Progress Notes (Signed)
Pt aware of negative results of hemoccult cards.

## 2014-12-31 ENCOUNTER — Telehealth: Payer: Self-pay | Admitting: Obstetrics and Gynecology

## 2014-12-31 NOTE — Telephone Encounter (Signed)
Pt aware.

## 2015-01-01 ENCOUNTER — Other Ambulatory Visit: Payer: Self-pay | Admitting: Endocrinology

## 2015-01-14 ENCOUNTER — Other Ambulatory Visit: Payer: Self-pay | Admitting: Endocrinology

## 2015-02-19 ENCOUNTER — Other Ambulatory Visit: Payer: Self-pay | Admitting: Endocrinology

## 2015-02-20 ENCOUNTER — Other Ambulatory Visit: Payer: Self-pay | Admitting: Endocrinology

## 2015-02-25 ENCOUNTER — Other Ambulatory Visit: Payer: Self-pay | Admitting: Endocrinology

## 2015-02-26 DIAGNOSIS — J45909 Unspecified asthma, uncomplicated: Secondary | ICD-10-CM | POA: Diagnosis not present

## 2015-02-26 DIAGNOSIS — R252 Cramp and spasm: Secondary | ICD-10-CM | POA: Diagnosis not present

## 2015-02-26 DIAGNOSIS — I10 Essential (primary) hypertension: Secondary | ICD-10-CM | POA: Diagnosis not present

## 2015-02-27 ENCOUNTER — Ambulatory Visit (HOSPITAL_COMMUNITY)
Admission: RE | Admit: 2015-02-27 | Discharge: 2015-02-27 | Disposition: A | Discharge status: Home / Self Care | Payer: Medicare Other | Source: Ambulatory Visit | Attending: Pulmonary Disease | Admitting: Pulmonary Disease

## 2015-02-27 ENCOUNTER — Other Ambulatory Visit (HOSPITAL_COMMUNITY): Payer: Self-pay | Admitting: Pulmonary Disease

## 2015-02-27 DIAGNOSIS — M79645 Pain in left finger(s): Secondary | ICD-10-CM | POA: Insufficient documentation

## 2015-02-27 DIAGNOSIS — M25532 Pain in left wrist: Secondary | ICD-10-CM

## 2015-02-27 DIAGNOSIS — M19042 Primary osteoarthritis, left hand: Secondary | ICD-10-CM | POA: Diagnosis not present

## 2015-02-27 DIAGNOSIS — M25832 Other specified joint disorders, left wrist: Secondary | ICD-10-CM | POA: Diagnosis not present

## 2015-03-03 ENCOUNTER — Ambulatory Visit (INDEPENDENT_AMBULATORY_CARE_PROVIDER_SITE_OTHER): Payer: Medicare Other | Admitting: Endocrinology

## 2015-03-03 ENCOUNTER — Encounter: Payer: Self-pay | Admitting: Endocrinology

## 2015-03-03 VITALS — BP 106/64 | HR 86 | Temp 98.2°F | Wt 191.2 lb

## 2015-03-03 DIAGNOSIS — E119 Type 2 diabetes mellitus without complications: Secondary | ICD-10-CM

## 2015-03-03 LAB — HEMOGLOBIN A1C: Hgb A1c MFr Bld: 7.1 % — ABNORMAL HIGH (ref 4.6–6.5)

## 2015-03-03 NOTE — Patient Instructions (Addendum)
blood tests are being requested for you today.  We'll let you know about the results.   If it is high, we'll add "repaglinide." check your blood sugar 1 time a day.  vary the time of day when you check, between before the 3 meals, and at bedtime.  also check if you have symptoms of your blood sugar being too high or too low.  please keep a record of the readings and bring it to your next appointment here.  please call us sooner if your blood sugar goes below 70, or if it stays over 200.   Please come back for a follow-up appointment in 6 months.

## 2015-03-03 NOTE — Progress Notes (Signed)
Subjective:    Patient ID: [name] White, female    DOB: 10-06-1941, 74 y.o.   MRN: [id number]  HPI  Pt returns for f/u of diabetes mellitus: DM type: 2 Dx'ed: 1999, after an episode of pancreatitis (no pancreatitis since then) Complications: none Therapy: 2 oral agents+victoza GDM: never DKA: never Severe hypoglycemia: never Other: she has never taken insulin; she cannot take bromocriptine, due to an interaction with maxalt. Interval history: pt states she feels well in general.  no cbg record, but states cbg's are well-controlled.   Past Medical History  Diagnosis Date  . Varicose veins   . 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 HYPOTHYROIDISM 09/14/2007  . HYPERCHOLESTEROLEMIA 02/09/2009  . DIABETES MELLITUS, TYPE II 09/14/2007  . MIGRAINE HEADACHE 09/14/2007  . HYPERTENSION 02/09/2009  . SUPERFICIAL PHLEBITIS 09/14/2007  . PANCREATITIS 09/14/2007  . OSTEOPOROSIS 02/09/2009  . ASYMPTOMATIC POSTMENOPAUSAL STATUS 02/09/2009  . PONV (postoperative nausea and vomiting)   . Shortness of breath   . Asthma   . Neuropathy   . Rectocele 03/19/2013  . Constipation 03/19/2013    Past Surgical History  Procedure Laterality Date  . Abdominal hysterectomy    . Nm esophageal reflux  08-11-11  . Transphenoidal / transnasal hypophysectomy / resection pituitary tumor  08-11-11  . Cholecystectomy    . Pituitary excision  10/1997  . Endovenous ablation saphenous vein w/ laser  11-03-2011   right greater saphenous vein    left leg done 10-2011  . Eye surgery  98    right cataract extraction 98  . Cataract extraction w/phaco  12/05/2011    Procedure: CATARACT EXTRACTION PHACO AND INTRAOCULAR LENS PLACEMENT (IOC);  Surgeon: Tonny Branch;  Location: [facility];  Service: Ophthalmology;  Laterality: Left;  CDE:9.65  . Posterior repair    . Cystoscopy    . Laparoscopic nissen fundoplication    . Anterior and posterior repair N/A 04/02/2013    Procedure: ANTERIOR (CYSTOCELE) AND POSTERIOR REPAIR (RECTOCELE);   Surgeon: Jonnie Kind, MD;  Location: [facility];  Service: Gynecology;  Laterality: N/A;    History   Social History  . Marital Status: Married    Spouse Name: N/A  . Number of Children: N/A  . Years of Education: N/A   Occupational History  . Retired    Social History Main Topics  . Smoking status: Never Smoker   . Smokeless tobacco: Never Used  . Alcohol Use: No  . Drug Use: No  . Sexual Activity: Yes    Birth Control/ Protection: Surgical   Other Topics Concern  . Not on file   Social History Narrative    Current Outpatient Prescriptions on File Prior to Visit  Medication Sig Dispense Refill  . ACCU-CHEK AVIVA PLUS test strip     . albuterol (PROVENTIL HFA;VENTOLIN HFA) 108 (90 BASE) MCG/ACT inhaler Inhale 2 puffs into the lungs every 4 (four) hours as needed.      Marland Kitchen aspirin 81 MG tablet Take 81 mg by mouth daily.    Marland Kitchen atorvastatin (LIPITOR) 40 MG tablet Take 1 tablet (40 mg total) by mouth daily. 90 tablet 3  . carvedilol (COREG) 3.125 MG tablet Take 1 tablet (3.125 mg total) by mouth 2 (two) times daily. 180 tablet 3  . cycloSPORINE (RESTASIS) 0.05 % ophthalmic emulsion Place 1 drop into both eyes 2 (two) times daily.      Marland Kitchen estradiol (ESTRACE) 1 MG tablet Take 1 tablet (1 mg total) by mouth daily. 30 tablet 11  .  Hydrocodone-Acetaminophen 2.5-325 MG TABS Take 5-325 mg by mouth as needed.    . Insulin Pen Needle (NOVOTWIST) 32G X 5 MM MISC Use as directed once a day.   V250.00 100 each 6  . levothyroxine (SYNTHROID, LEVOTHROID) 50 MCG tablet TAKE ONE (1) TABLET EACH DAY 30 tablet 0  . lisinopril (PRINIVIL,ZESTRIL) 5 MG tablet Take 5 mg by mouth every morning.      . Magnesium 100 MG CAPS Take 100 mg by mouth once.    . metFORMIN (GLUCOPHAGE-XR) 500 MG 24 hr tablet TAKE TWO TABLETS BY MOUTH DAILY 60 tablet 2  . metFORMIN (GLUCOPHAGE-XR) 500 MG 24 hr tablet TAKE TWO (2) TABLETS BY MOUTH DAILY 60 tablet 2  . Multiple Vitamins-Minerals (MULTIVITAMINS THER. W/MINERALS)  TABS Take 1 tablet by mouth every morning.      Marland Kitchen NOVOFINE 32G X 6 MM MISC USE ONCE DAILY 100 each 3  . omeprazole (PRILOSEC) 20 MG capsule Take 20 mg by mouth every morning.     . pioglitazone (ACTOS) 45 MG tablet TAKE 1 TABLET BY MOUTH ONCE DAILY 90 tablet 3  . pregabalin (LYRICA) 150 MG capsule Take 150 mg by mouth 2 (two) times daily.     . rizatriptan (MAXALT-MLT) 10 MG disintegrating tablet Take 10 mg by mouth daily as needed. May repeat in 2 hours if needed    . venlafaxine (EFFEXOR-XR) 150 MG 24 hr capsule Take 150 mg by mouth every morning.     Marland Kitchen VICTOZA 18 MG/3ML SOPN INJECT 1.8MG  ONCE DAILY 9 mL 0   No current facility-administered medications on file prior to visit.    Allergies  Allergen Reactions  . Betadine [Povidone Iodine] Hives  . Penicillins Rash    Family History  Problem Relation Age of Onset  . Cancer Neg Hx   . Anesthesia problems Neg Hx   . Hypotension Neg Hx   . Malignant hyperthermia Neg Hx   . Pseudochol deficiency Neg Hx   . Heart disease Mother   . Asthma Mother   . COPD Father   . Heart disease Father   . Asthma Daughter   . Migraines Daughter   . Neuropathy Son     BP 106/64 mmHg  Pulse 86  Temp(Src) 98.2 F (36.8 C) (Oral)  Wt 191 lb 3.2 oz (86.728 kg)  Review of Systems She denies hypoglycemia.      Objective:   Physical Exam VITAL SIGNS:  See vs page GENERAL: no distress Pulses: dorsalis pedis intact bilat.   MSK: no deformity of the feet CV: no leg edema, but there are bilat varicosities.  Skin:  no ulcer on the feet.  normal color and temp on the feet. Neuro: sensation is intact to touch on the feet.   Lab Results  Component Value Date   HGBA1C 7.1* 03/03/2015      Assessment & Plan:  DM: well-controlled  Patient is advised the following: Patient Instructions  blood tests are being requested for you today.  We'll let you know about the results.   If it is high, we'll add "repaglinide." check your blood sugar 1 time  a day.  vary the time of day when you check, between before the 3 meals, and at bedtime.  also check if you have symptoms of your blood sugar being too high or too low.  please keep a record of the readings and bring it to your next appointment here.  please call us sooner if your blood sugar goes below  70, or if it stays over 200.   Please come White for a follow-up appointment in 6 months.     addendum: Please continue the same medications

## 2015-03-13 ENCOUNTER — Ambulatory Visit (INDEPENDENT_AMBULATORY_CARE_PROVIDER_SITE_OTHER): Payer: Medicare Other | Admitting: Cardiology

## 2015-03-13 ENCOUNTER — Encounter: Payer: Self-pay | Admitting: Cardiology

## 2015-03-13 VITALS — BP 106/64 | HR 97 | Ht 66.0 in | Wt 193.0 lb

## 2015-03-13 DIAGNOSIS — R600 Localized edema: Secondary | ICD-10-CM

## 2015-03-13 DIAGNOSIS — R002 Palpitations: Secondary | ICD-10-CM

## 2015-03-13 DIAGNOSIS — I10 Essential (primary) hypertension: Secondary | ICD-10-CM

## 2015-03-13 DIAGNOSIS — E7800 Pure hypercholesterolemia, unspecified: Secondary | ICD-10-CM

## 2015-03-13 DIAGNOSIS — R609 Edema, unspecified: Secondary | ICD-10-CM | POA: Diagnosis not present

## 2015-03-13 MED ORDER — FUROSEMIDE 20 MG PO TABS: ORAL_TABLET | ORAL | Status: DC

## 2015-03-13 NOTE — Patient Instructions (Addendum)
Your physician wants you to follow-up in: 6 months with Dr.Branch You will receive a reminder letter in the mail two months in advance. If you don't receive a letter, please call our office to schedule the follow-up appointment.   Take lasix 20 mg daily AS NEEDED for leg swelling   Please get lab work :BMET,TSH       Thank you for choosing [facility] !

## 2015-03-13 NOTE — Progress Notes (Signed)
Clinical Summary [name] White is a 74 y.o.female seen today for follow up of the following medical problems.   1. Palpitations - compliant with beta blocker. No reccurent symptoms since staring coreg.     2. HTN - compliant with meds - checks bp daily, typically pulses in 80-90s with bp 100s/60s  3. Hyperlipidemia - compliant with statin - last lipid panel 08/2014 TC 126 TG 117 HDL 50 LDL 52  4. LE edema - Bilateral edema. Started approx 4-5 days ago. No SOB, no orthopnea, no PND - reports she follows a low sodium diet  Past Medical History  Diagnosis Date  . Varicose veins   . 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 HYPOTHYROIDISM 09/14/2007  . HYPERCHOLESTEROLEMIA 02/09/2009  . DIABETES MELLITUS, TYPE II 09/14/2007  . MIGRAINE HEADACHE 09/14/2007  . HYPERTENSION 02/09/2009  . SUPERFICIAL PHLEBITIS 09/14/2007  . PANCREATITIS 09/14/2007  . OSTEOPOROSIS 02/09/2009  . ASYMPTOMATIC POSTMENOPAUSAL STATUS 02/09/2009  . PONV (postoperative nausea and vomiting)   . Shortness of breath   . Asthma   . Neuropathy   . Rectocele 03/19/2013  . Constipation 03/19/2013     Allergies  Allergen Reactions  . Betadine [Povidone Iodine] Hives  . Penicillins Rash     Current Outpatient Prescriptions  Medication Sig Dispense Refill  . ACCU-CHEK AVIVA PLUS test strip     . albuterol (PROVENTIL HFA;VENTOLIN HFA) 108 (90 BASE) MCG/ACT inhaler Inhale 2 puffs into the lungs every 4 (four) hours as needed.      Marland Kitchen aspirin 81 MG tablet Take 81 mg by mouth daily.    Marland Kitchen atorvastatin (LIPITOR) 40 MG tablet Take 1 tablet (40 mg total) by mouth daily. 90 tablet 3  . carvedilol (COREG) 3.125 MG tablet Take 1 tablet (3.125 mg total) by mouth 2 (two) times daily. 180 tablet 3  . cycloSPORINE (RESTASIS) 0.05 % ophthalmic emulsion Place 1 drop into both eyes 2 (two) times daily.      Marland Kitchen estradiol (ESTRACE) 1 MG tablet Take 1 tablet (1 mg total) by mouth daily. 30 tablet 11  . Fluticasone Furoate-Vilanterol 100-25  MCG/INH AEPB Inhale 1 puff into the lungs daily.    . Hydrocodone-Acetaminophen 2.5-325 MG TABS Take 5-325 mg by mouth as needed.    . Insulin Pen Needle (NOVOTWIST) 32G X 5 MM MISC Use as directed once a day.   V250.00 100 each 6  . levothyroxine (SYNTHROID, LEVOTHROID) 50 MCG tablet TAKE ONE (1) TABLET EACH DAY 30 tablet 0  . lisinopril (PRINIVIL,ZESTRIL) 5 MG tablet Take 5 mg by mouth every morning.      . Magnesium 100 MG CAPS Take 100 mg by mouth once.    . metFORMIN (GLUCOPHAGE-XR) 500 MG 24 hr tablet TAKE TWO TABLETS BY MOUTH DAILY 60 tablet 2  . metFORMIN (GLUCOPHAGE-XR) 500 MG 24 hr tablet TAKE TWO (2) TABLETS BY MOUTH DAILY 60 tablet 2  . Multiple Vitamins-Minerals (MULTIVITAMINS THER. W/MINERALS) TABS Take 1 tablet by mouth every morning.      Marland Kitchen NOVOFINE 32G X 6 MM MISC USE ONCE DAILY 100 each 3  . omeprazole (PRILOSEC) 20 MG capsule Take 20 mg by mouth every morning.     . pioglitazone (ACTOS) 45 MG tablet TAKE 1 TABLET BY MOUTH ONCE DAILY 90 tablet 3  . pregabalin (LYRICA) 150 MG capsule Take 150 mg by mouth 2 (two) times daily.     . rizatriptan (MAXALT-MLT) 10 MG disintegrating tablet Take 10 mg by mouth daily as needed. May  repeat in 2 hours if needed    . venlafaxine (EFFEXOR-XR) 150 MG 24 hr capsule Take 150 mg by mouth every morning.     Marland Kitchen VICTOZA 18 MG/3ML SOPN INJECT 1.8MG  ONCE DAILY 9 mL 0   No current facility-administered medications for this visit.     Past Surgical History  Procedure Laterality Date  . Abdominal hysterectomy    . Nm esophageal reflux  08-11-11  . Transphenoidal / transnasal hypophysectomy / resection pituitary tumor  08-11-11  . Cholecystectomy    . Pituitary excision  10/1997  . Endovenous ablation saphenous vein w/ laser  11-03-2011   right greater saphenous vein    left leg done 10-2011  . Eye surgery  98    right cataract extraction 98  . Cataract extraction w/phaco  12/05/2011    Procedure: CATARACT EXTRACTION PHACO AND INTRAOCULAR  LENS PLACEMENT (IOC);  Surgeon: Tonny Yaritsa Savarino;  Location: [facility];  Service: Ophthalmology;  Laterality: Left;  CDE:9.65  . Posterior repair    . Cystoscopy    . Laparoscopic nissen fundoplication    . Anterior and posterior repair N/A 04/02/2013    Procedure: ANTERIOR (CYSTOCELE) AND POSTERIOR REPAIR (RECTOCELE);  Surgeon: Jonnie Kind, MD;  Location: [facility];  Service: Gynecology;  Laterality: N/A;     Allergies  Allergen Reactions  . Betadine [Povidone Iodine] Hives  . Penicillins Rash      Family History  Problem Relation Age of Onset  . Cancer Neg Hx   . Anesthesia problems Neg Hx   . Hypotension Neg Hx   . Malignant hyperthermia Neg Hx   . Pseudochol deficiency Neg Hx   . Heart disease Mother   . Asthma Mother   . COPD Father   . Heart disease Father   . Asthma Daughter   . Migraines Daughter   . Neuropathy Son      Social History Ms. Petrov reports that she has never smoked. She has never used smokeless tobacco. Ms. Shelvin reports that she does not drink alcohol.   Review of Systems CONSTITUTIONAL: No weight loss, fever, chills, weakness or fatigue.  HEENT: Eyes: No visual loss, blurred vision, double vision or yellow sclerae.No hearing loss, sneezing, congestion, runny nose or sore throat.  SKIN: No rash or itching.  CARDIOVASCULAR: per HPI RESPIRATORY: No shortness of breath, cough or sputum.  GASTROINTESTINAL: No anorexia, nausea, vomiting or diarrhea. No abdominal pain or blood.  GENITOURINARY: No burning on urination, no polyuria NEUROLOGICAL: No headache, dizziness, syncope, paralysis, ataxia, numbness or tingling in the extremities. No change in bowel or bladder control.  MUSCULOSKELETAL:leg swelling LYMPHATICS: No enlarged nodes. No history of splenectomy.  PSYCHIATRIC: No history of depression or anxiety.  ENDOCRINOLOGIC: No reports of sweating, cold or heat intolerance. No polyuria or polydipsia.  Marland Kitchen   Physical Examination p 97 bp 106/64 Wt 193 lbs  BMI 31 Gen: resting comfortably, no acute distress HEENT: no scleral icterus, pupils equal round and reactive, no palptable cervical adenopathy,  CV: RRR, no m/r/g, no JVD, no carotid bruits Resp: Clear to auscultation bilaterally GI: abdomen is soft, non-tender, non-distended, normal bowel sounds, no hepatosplenomegaly MSK: extremities are warm, no edema.  Skin: warm, no rash Neuro:  no focal deficits Psych: appropriate affect   Diagnostic Studies  06/2014 Echo Study Conclusions  - Left ventricle: The cavity size was normal. Wall thickness was increased in a pattern of mild LVH. Systolic function was normal. The estimated ejection fraction was in the range of 60%  to 65%. Wall motion was normal; there were no regional wall motion abnormalities. Features are consistent with a pseudonormal left ventricular filling pattern, with concomitant abnormal relaxation and increased filling pressure (grade 2 diastolic dysfunction). - Aortic valve: There was trivial regurgitation. - Mitral valve: Calcified annulus. There was mild regurgitation. - Left atrium: The atrium was moderately dilated. - Right atrium: Central venous pressure (est): 3 mm Hg. - Tricuspid valve: There was trivial regurgitation. - Pulmonary arteries: Systolic pressure could not be accurately estimated. - Pericardium, extracardiac: There was no pericardial effusion.  Impressions:  - Mild LVH with LVEF 89-78%, grade 2 diastolic dysfunction. Moderate left atrial enlargement. MAC with mild mitral regurgitation. Unable to assess PASP.    Assessment and Plan  1. Palpitations - no recent symptoms, continue current meds  2. HTN - at goal, continue current meds.   3. Hyperlipidemia - at goal, continue statin  4. LE edema - repeat labs. Will start lasix prn. Echo with normal LVEF, grade II diastolic dysfunction which may be the cause. Continue low sodium diet.     F/u 6 months  Arnoldo Lenis, M.D.

## 2015-03-14 LAB — BASIC METABOLIC PANEL
BUN: 18 mg/dL (ref 6–23)
CO2: 30 meq/L (ref 19–32)
Calcium: 9 mg/dL (ref 8.4–10.5)
Chloride: 103 mEq/L (ref 96–112)
Creat: 0.77 mg/dL (ref 0.50–1.10)
Glucose, Bld: 94 mg/dL (ref 70–99)
POTASSIUM: 4.2 meq/L (ref 3.5–5.3)
Sodium: 141 mEq/L (ref 135–145)

## 2015-03-14 LAB — TSH: TSH: 1.383 u[IU]/mL (ref 0.350–4.500)

## 2015-03-24 ENCOUNTER — Other Ambulatory Visit: Payer: Self-pay | Admitting: Endocrinology

## 2015-04-01 ENCOUNTER — Other Ambulatory Visit: Payer: Self-pay | Admitting: Endocrinology

## 2015-05-21 ENCOUNTER — Other Ambulatory Visit: Payer: Self-pay | Admitting: Endocrinology

## 2015-06-09 ENCOUNTER — Telehealth: Payer: Self-pay | Admitting: Endocrinology

## 2015-06-09 NOTE — Telephone Encounter (Signed)
Attempted to reach the pt. Will try again at a later time.  

## 2015-06-09 NOTE — Telephone Encounter (Signed)
I left a voice mail advising the pt Dr. Loanne Drilling is currently out of the office and would not return till 6/27. Pt was advised to address this issue with her PCP.

## 2015-06-09 NOTE — Telephone Encounter (Signed)
Patient called stating that she has swelling and redness in both legs more in the (L) leg  +Tingling  - pain   Please advise patient    Thank you

## 2015-06-10 ENCOUNTER — Other Ambulatory Visit: Payer: Self-pay | Admitting: Cardiovascular Disease

## 2015-06-16 DIAGNOSIS — E119 Type 2 diabetes mellitus without complications: Secondary | ICD-10-CM | POA: Diagnosis not present

## 2015-06-16 DIAGNOSIS — L0390 Cellulitis, unspecified: Secondary | ICD-10-CM | POA: Diagnosis not present

## 2015-06-16 DIAGNOSIS — I10 Essential (primary) hypertension: Secondary | ICD-10-CM | POA: Diagnosis not present

## 2015-06-24 ENCOUNTER — Other Ambulatory Visit: Payer: Self-pay | Admitting: Endocrinology

## 2015-06-25 ENCOUNTER — Other Ambulatory Visit (HOSPITAL_COMMUNITY): Payer: Self-pay | Admitting: Pulmonary Disease

## 2015-06-25 DIAGNOSIS — L03115 Cellulitis of right lower limb: Secondary | ICD-10-CM

## 2015-06-25 DIAGNOSIS — L03119 Cellulitis of unspecified part of limb: Secondary | ICD-10-CM | POA: Diagnosis not present

## 2015-06-25 DIAGNOSIS — L03116 Cellulitis of left lower limb: Principal | ICD-10-CM

## 2015-06-26 ENCOUNTER — Ambulatory Visit (HOSPITAL_COMMUNITY)
Admission: RE | Admit: 2015-06-26 | Discharge: 2015-06-26 | Disposition: A | Discharge status: Home / Self Care | Payer: Medicare Other | Source: Ambulatory Visit | Attending: Pulmonary Disease | Admitting: Pulmonary Disease

## 2015-06-26 DIAGNOSIS — M79662 Pain in left lower leg: Secondary | ICD-10-CM | POA: Diagnosis not present

## 2015-06-26 DIAGNOSIS — M79661 Pain in right lower leg: Secondary | ICD-10-CM | POA: Diagnosis not present

## 2015-06-26 DIAGNOSIS — L03115 Cellulitis of right lower limb: Secondary | ICD-10-CM

## 2015-06-26 DIAGNOSIS — L03116 Cellulitis of left lower limb: Secondary | ICD-10-CM | POA: Diagnosis not present

## 2015-07-02 ENCOUNTER — Other Ambulatory Visit: Payer: Self-pay | Admitting: Endocrinology

## 2015-07-09 DIAGNOSIS — L03119 Cellulitis of unspecified part of limb: Secondary | ICD-10-CM | POA: Diagnosis not present

## 2015-07-09 DIAGNOSIS — I879 Disorder of vein, unspecified: Secondary | ICD-10-CM | POA: Diagnosis not present

## 2015-07-09 DIAGNOSIS — E119 Type 2 diabetes mellitus without complications: Secondary | ICD-10-CM | POA: Diagnosis not present

## 2015-07-09 DIAGNOSIS — I10 Essential (primary) hypertension: Secondary | ICD-10-CM | POA: Diagnosis not present

## 2015-07-14 ENCOUNTER — Ambulatory Visit (HOSPITAL_COMMUNITY)
Admission: RE | Admit: 2015-07-14 | Discharge: 2015-07-14 | Disposition: A | Discharge status: Home / Self Care | Payer: Medicare Other | Source: Ambulatory Visit | Attending: Vascular Surgery | Admitting: Vascular Surgery

## 2015-07-14 ENCOUNTER — Other Ambulatory Visit: Payer: Self-pay | Admitting: Vascular Surgery

## 2015-07-14 ENCOUNTER — Other Ambulatory Visit: Payer: Self-pay

## 2015-07-14 ENCOUNTER — Encounter: Payer: Self-pay | Admitting: Vascular Surgery

## 2015-07-14 ENCOUNTER — Ambulatory Visit (INDEPENDENT_AMBULATORY_CARE_PROVIDER_SITE_OTHER): Payer: Medicare Other | Admitting: Vascular Surgery

## 2015-07-14 VITALS — BP 106/69 | HR 79 | Temp 97.2°F | Resp 18 | Ht 64.0 in | Wt 191.9 lb

## 2015-07-14 DIAGNOSIS — I872 Venous insufficiency (chronic) (peripheral): Secondary | ICD-10-CM | POA: Insufficient documentation

## 2015-07-14 DIAGNOSIS — I82812 Embolism and thrombosis of superficial veins of left lower extremities: Secondary | ICD-10-CM | POA: Insufficient documentation

## 2015-07-14 DIAGNOSIS — Z48812 Encounter for surgical aftercare following surgery on the circulatory system: Secondary | ICD-10-CM

## 2015-07-14 DIAGNOSIS — I87309 Chronic venous hypertension (idiopathic) without complications of unspecified lower extremity: Secondary | ICD-10-CM

## 2015-07-14 NOTE — Progress Notes (Signed)
Referred by:  Sinda Du, MD [address], [place] [id number]  Reason for referral: Bilateral leg tenderness and swelling  History of Present Illness  [name] White is a 74 y.o. (05/27/1941) female who presents with chief complaint: bilateral lower leg tenderness, swelling and erythema for six weeks. Her left leg is more bothersome than the right. She has seen her PCP, Dr. Luan Pulling who has put her on two courses of doxycycline for cellulitis. She is known to Dr. Donnetta Hutching having undergone bilateral great saphenous laser ablation in 2012. She denies any significant improvement in her symptoms following the antibiotics. She denies any fever or chills. Her swelling is improved with leg elevation. She reports trying to get compression stockings at her pharmacy recently but was denied due to the presence of her erythema. She was advised to seek permission from a physician first.   She complains of "pulling" in her ankles bilaterally with dorsi and plantarflexion. She denies any bleeding issues. She denies any non healing wounds. She complains of cramping throughout her legs both with walking and at rest.   She is diabetic currently managed on oral hypoglycemics. She has hypertension and hypercholesterolimia. Her medical history is unchanged since she was last seen in 2012 except for rectocele repair.   Past Medical History  Diagnosis Date  . Varicose veins   . 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 HYPOTHYROIDISM 09/14/2007  . HYPERCHOLESTEROLEMIA 02/09/2009  . DIABETES MELLITUS, TYPE II 09/14/2007  . MIGRAINE HEADACHE 09/14/2007  . HYPERTENSION 02/09/2009  . SUPERFICIAL PHLEBITIS 09/14/2007  . PANCREATITIS 09/14/2007  . OSTEOPOROSIS 02/09/2009  . ASYMPTOMATIC POSTMENOPAUSAL STATUS 02/09/2009  . PONV (postoperative nausea and vomiting)   . Shortness of breath   . Asthma   . Neuropathy   . Rectocele 03/19/2013  . Constipation 03/19/2013    Past Surgical History  Procedure  Laterality Date  . Abdominal hysterectomy    . Nm esophageal reflux  08-11-11  . Transphenoidal / transnasal hypophysectomy / resection pituitary tumor  08-11-11  . Cholecystectomy    . Pituitary excision  10/1997  . Endovenous ablation saphenous vein w/ laser  11-03-2011   right greater saphenous vein    left leg done 10-2011  . Eye surgery  98    right cataract extraction 98  . Cataract extraction w/phaco  12/05/2011    Procedure: CATARACT EXTRACTION PHACO AND INTRAOCULAR LENS PLACEMENT (IOC);  Surgeon: Tonny Branch;  Location: [facility];  Service: Ophthalmology;  Laterality: Left;  CDE:9.65  . Posterior repair    . Cystoscopy    . Laparoscopic nissen fundoplication    . Anterior and posterior repair N/A 04/02/2013    Procedure: ANTERIOR (CYSTOCELE) AND POSTERIOR REPAIR (RECTOCELE);  Surgeon: Jonnie Kind, MD;  Location: [facility];  Service: Gynecology;  Laterality: N/A;    History   Social History  . Marital Status: Married    Spouse Name: N/A  . Number of Children: N/A  . Years of Education: N/A   Occupational History  . Retired    Social History Main Topics  . Smoking status: Never Smoker   . Smokeless tobacco: Never Used  . Alcohol Use: No  . Drug Use: No  . Sexual Activity: Yes    Birth Control/ Protection: Surgical   Other Topics Concern  . Not on file   Social History Narrative    Family History  Problem Relation Age of Onset  . Cancer Neg Hx   . Anesthesia problems Neg  Hx   . Hypotension Neg Hx   . Malignant hyperthermia Neg Hx   . Pseudochol deficiency Neg Hx   . Heart disease Mother   . Asthma Mother   . COPD Father   . Heart disease Father   . Asthma Daughter   . Migraines Daughter   . Neuropathy Son     Current Outpatient Prescriptions on File Prior to Visit  Medication Sig Dispense Refill  . ACCU-CHEK AVIVA PLUS test strip     . albuterol (PROVENTIL HFA;VENTOLIN HFA) 108 (90 BASE) MCG/ACT inhaler Inhale 2 puffs into the lungs every 4 (four) hours  as needed.      Marland Kitchen aspirin 81 MG tablet Take 81 mg by mouth daily.    Marland Kitchen atorvastatin (LIPITOR) 40 MG tablet Take 1 tablet (40 mg total) by mouth daily. 90 tablet 3  . carvedilol (COREG) 3.125 MG tablet TAKE ONE TABLET TWICE DAILY 180 tablet 3  . cycloSPORINE (RESTASIS) 0.05 % ophthalmic emulsion Place 1 drop into both eyes 2 (two) times daily.      Marland Kitchen estradiol (ESTRACE) 1 MG tablet Take 1 tablet (1 mg total) by mouth daily. 30 tablet 11  . Fluticasone Furoate-Vilanterol 100-25 MCG/INH AEPB Inhale 1 puff into the lungs daily.    . furosemide (LASIX) 20 MG tablet May take Lasix 20 mg as NEEDED for leg swelling 45 tablet 3  . Hydrocodone-Acetaminophen 2.5-325 MG TABS Take 5-325 mg by mouth as needed.    . Insulin Pen Needle (NOVOTWIST) 32G X 5 MM MISC Use as directed once a day.   V250.00 100 each 6  . levothyroxine (SYNTHROID, LEVOTHROID) 50 MCG tablet TAKE ONE (1) TABLET EACH DAY 30 tablet 2  . lisinopril (PRINIVIL,ZESTRIL) 5 MG tablet Take 5 mg by mouth every morning.      . Magnesium 100 MG CAPS Take 100 mg by mouth once.    . metFORMIN (GLUCOPHAGE-XR) 500 MG 24 hr tablet TAKE TWO TABLETS BY MOUTH DAILY 60 tablet 2  . Multiple Vitamins-Minerals (MULTIVITAMINS THER. W/MINERALS) TABS Take 1 tablet by mouth every morning.      Marland Kitchen NOVOFINE 32G X 6 MM MISC USE ONCE DAILY 100 each 3  . omeprazole (PRILOSEC) 20 MG capsule Take 20 mg by mouth every morning.     . pioglitazone (ACTOS) 45 MG tablet TAKE 1 TABLET BY MOUTH ONCE DAILY 90 tablet 3  . pregabalin (LYRICA) 150 MG capsule Take 150 mg by mouth 2 (two) times daily.     . rizatriptan (MAXALT-MLT) 10 MG disintegrating tablet Take 10 mg by mouth daily as needed. May repeat in 2 hours if needed    . venlafaxine (EFFEXOR-XR) 150 MG 24 hr capsule Take 150 mg by mouth every morning.     Marland Kitchen VICTOZA 18 MG/3ML SOPN INJECT 1.8 MG SUBCUTANEOUSLY DAILY. 9 mL 2  . metFORMIN (GLUCOPHAGE-XR) 500 MG 24 hr tablet TAKE TWO TABLET BY MOUTH DAILY (Patient not taking:  Reported on 07/14/2015) 60 tablet 3   No current facility-administered medications on file prior to visit.    Allergies  Allergen Reactions  . Betadine [Povidone Iodine] Hives  . Penicillins Rash    REVIEW OF SYSTEMS:  (Positives checked otherwise negative)  CARDIOVASCULAR:  []  chest pain, []  chest pressure, []  palpitations, []  shortness of breath when laying flat, []  shortness of breath with exertion,  [x]  pain in legs when walking, []  pain in feet when laying flat, []  history of blood clot in veins (DVT), []  history of  phlebitis, []  swelling in legs, [x]  varicose veins  PULMONARY:  []  productive cough, []  asthma, []  wheezing  NEUROLOGIC:  []  weakness in arms or legs, []  numbness in arms or legs, []  difficulty speaking or slurred speech, []  temporary loss of vision in one eye, []  dizziness  HEMATOLOGIC:  []  bleeding problems, []  problems with blood clotting too easily  MUSCULOSKEL:  []  joint pain, []  joint swelling  GASTROINTEST:  []  vomiting blood, []  blood in stool     GENITOURINARY:  []  burning with urination, []  blood in urine  PSYCHIATRIC:  []  history of major depression  INTEGUMENTARY:  []  rashes, []  ulcers  CONSTITUTIONAL:  []  fever, []  chills   Physical Examination Filed Vitals:   07/14/15 1446  BP: 106/69  Pulse: 79  Temp: 97.2 F (36.2 C)  TempSrc: Oral  Resp: 18  Height: 5\' 4"  (1.626 m)  Weight: 191 lb 14.4 oz (87.045 kg)  SpO2: 95%   Body mass index is 32.92 kg/(m^2).  General: A&O x 3, WDWN obese female in NAD  Head: [place]/AT  Neck: Supple  Pulmonary: Sym exp, good air movt, CTAB, no rales, rhonchi, & wheezing  Cardiac: RRR, Nl S1, S2, no Murmurs, rubs or gallops, no carotid bruits.   Vascular: palpable popliteal pulses b/l. Feet are warm b/l. Mild erythema to lower legs bilaterally.  Diffuse telangectasias bilaterally. Palpable cord in left posterior calf. 1+ edema of legs b/l   Musculoskeletal: Extremities without ischemic changes.    Neurologic: No focal deficits. Pain and light touch intact in extremities.   Psychiatric: Judgment intact, Mood & affect appropriate for pt's clinical situation  Dermatologic: See M/S exam for extremity exam, no rashes otherwise noted  Non-Invasive Vascular Imaging  BLE Venous Insufficiency Duplex (Date: 07/14/2015):   RLE: negative DVT, GSV s/p ablation, positive deep venous reflux (common femoral, popliteal)  LLE: negative DVT,GSV s/p ablation, chronic thrombus in small saphenous vein, postive deep venous reflux (common femoral, popliteal)  Outside Studies/Documentation 6 pages of outside documents were reviewed including: medical records from PCP.  Medical Decision Making  MERRILEE ANCONA is a 74 y.o. female who presents with: bilateral chronic venous insufficiency s/p bilateral great saphenous vein ablation 2012.   The patient has evidence of deep venous reflux bilaterally. Her erythema is likely secondary to increased venous hypertension and swelling. She will not need any further antibiotics. Recommend elevation of her legs above the heart and 20-30 mmHg knee high compression stockings for her swelling and to prevent progression of her symptoms. She will follow up on an as needed basis.   Virgina Jock, PA-C [facility] Office: [phone] Pager: [phone]  07/14/2015, 3:18 PM  This patient was seen and examined in conjunction with Dr. Donnetta Hutching  I have examined the patient, reviewed and agree with above.  Curt Jews, MD 07/14/2015 4:03 PM

## 2015-09-03 ENCOUNTER — Encounter: Payer: Self-pay | Admitting: Endocrinology

## 2015-09-03 ENCOUNTER — Ambulatory Visit (INDEPENDENT_AMBULATORY_CARE_PROVIDER_SITE_OTHER): Payer: Medicare Other | Admitting: Endocrinology

## 2015-09-03 VITALS — BP 122/70 | HR 92 | Temp 97.8°F | Ht 64.0 in | Wt 189.0 lb

## 2015-09-03 DIAGNOSIS — Z23 Encounter for immunization: Secondary | ICD-10-CM

## 2015-09-03 DIAGNOSIS — E119 Type 2 diabetes mellitus without complications: Secondary | ICD-10-CM

## 2015-09-03 LAB — POCT GLYCOSYLATED HEMOGLOBIN (HGB A1C): Hemoglobin A1C: 5.9

## 2015-09-03 MED ORDER — PIOGLITAZONE HCL 15 MG PO TABS: 15.0000 mg | ORAL_TABLET | Freq: Every day | ORAL | Status: DC

## 2015-09-03 NOTE — Patient Instructions (Addendum)
Please stop taking the pioglitizone pill.  In 1 month, resume it at 15 mg daily.  i have sent a prescription to your pharmacy check your blood sugar 1 time a day.  vary the time of day when you check, between before the 3 meals, and at bedtime.  also check if you have symptoms of your blood sugar being too high or too low.  please keep a record of the readings and bring it to your next appointment here.  please call us sooner if your blood sugar goes below 70, or if it stays over 200.   Please come back for a follow-up appointment in 4 months.

## 2015-09-03 NOTE — Progress Notes (Signed)
Subjective:    Patient ID: [name] White, female    DOB: 1941/09/03, 74 y.o.   MRN: [id number]  HPI Pt returns for f/u of diabetes mellitus: DM type: 2 Dx'ed: 1999, after an episode of pancreatitis (no pancreatitis since then) Complications: none Therapy: 2 oral agents+victoza GDM: never DKA: never Severe hypoglycemia: never Other: she has never taken insulin; she cannot take bromocriptine, due to an interaction with maxalt. Interval history: pt states she feels well in general.  no cbg record, but states cbg's are well-controlled.  Pt reports 1 month of slight swelling of the legs, and assoc pain.   Past Medical History  Diagnosis Date  . Varicose veins   . 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 HYPOTHYROIDISM 09/14/2007  . HYPERCHOLESTEROLEMIA 02/09/2009  . DIABETES MELLITUS, TYPE II 09/14/2007  . MIGRAINE HEADACHE 09/14/2007  . HYPERTENSION 02/09/2009  . SUPERFICIAL PHLEBITIS 09/14/2007  . PANCREATITIS 09/14/2007  . OSTEOPOROSIS 02/09/2009  . ASYMPTOMATIC POSTMENOPAUSAL STATUS 02/09/2009  . PONV (postoperative nausea and vomiting)   . Shortness of breath   . Asthma   . Neuropathy   . Rectocele 03/19/2013  . Constipation 03/19/2013    Past Surgical History  Procedure Laterality Date  . Abdominal hysterectomy    . Nm esophageal reflux  08-11-11  . Transphenoidal / transnasal hypophysectomy / resection pituitary tumor  08-11-11  . Cholecystectomy    . Pituitary excision  10/1997  . Endovenous ablation saphenous vein w/ laser  11-03-2011   right greater saphenous vein    left leg done 10-2011  . Eye surgery  98    right cataract extraction 98  . Cataract extraction w/phaco  12/05/2011    Procedure: CATARACT EXTRACTION PHACO AND INTRAOCULAR LENS PLACEMENT (IOC);  Surgeon: Tonny Branch;  Location: [facility];  Service: Ophthalmology;  Laterality: Left;  CDE:9.65  . Posterior repair    . Cystoscopy    . Laparoscopic nissen fundoplication    . Anterior and posterior repair N/A 04/02/2013   Procedure: ANTERIOR (CYSTOCELE) AND POSTERIOR REPAIR (RECTOCELE);  Surgeon: Jonnie Kind, MD;  Location: [facility];  Service: Gynecology;  Laterality: N/A;    Social History   Social History  . Marital Status: Married    Spouse Name: N/A  . Number of Children: N/A  . Years of Education: N/A   Occupational History  . Retired    Social History Main Topics  . Smoking status: Never Smoker   . Smokeless tobacco: Never Used  . Alcohol Use: No  . Drug Use: No  . Sexual Activity: Yes    Birth Control/ Protection: Surgical   Other Topics Concern  . Not on file   Social History Narrative    Current Outpatient Prescriptions on File Prior to Visit  Medication Sig Dispense Refill  . ACCU-CHEK AVIVA PLUS test strip     . albuterol (PROVENTIL HFA;VENTOLIN HFA) 108 (90 BASE) MCG/ACT inhaler Inhale 2 puffs into the lungs every 4 (four) hours as needed.      Marland Kitchen aspirin 81 MG tablet Take 81 mg by mouth daily.    . baclofen (LIORESAL) 10 MG tablet Take 10 mg by mouth daily.    . carvedilol (COREG) 3.125 MG tablet TAKE ONE TABLET TWICE DAILY 180 tablet 3  . cycloSPORINE (RESTASIS) 0.05 % ophthalmic emulsion Place 1 drop into both eyes 2 (two) times daily.      Marland Kitchen estradiol (ESTRACE) 1 MG tablet Take 1 tablet (1 mg total) by mouth daily. 30 tablet 11  .  Fluticasone Furoate-Vilanterol 100-25 MCG/INH AEPB Inhale 1 puff into the lungs daily.    . furosemide (LASIX) 20 MG tablet May take Lasix 20 mg as NEEDED for leg swelling 45 tablet 3  . Hydrocodone-Acetaminophen 2.5-325 MG TABS Take 5-325 mg by mouth as needed.    . Insulin Pen Needle (NOVOTWIST) 32G X 5 MM MISC Use as directed once a day.   V250.00 100 each 6  . levothyroxine (SYNTHROID, LEVOTHROID) 50 MCG tablet TAKE ONE (1) TABLET EACH DAY 30 tablet 2  . lisinopril (PRINIVIL,ZESTRIL) 5 MG tablet Take 5 mg by mouth every morning.      . Magnesium 100 MG CAPS Take 100 mg by mouth once.    . metFORMIN (GLUCOPHAGE-XR) 500 MG 24 hr tablet TAKE  TWO TABLETS BY MOUTH DAILY 60 tablet 2  . Multiple Vitamins-Minerals (MULTIVITAMINS THER. W/MINERALS) TABS Take 1 tablet by mouth every morning.      Marland Kitchen NOVOFINE 32G X 6 MM MISC USE ONCE DAILY 100 each 3  . omeprazole (PRILOSEC) 20 MG capsule Take 20 mg by mouth every morning.     . pregabalin (LYRICA) 150 MG capsule Take 150 mg by mouth 2 (two) times daily.     . rizatriptan (MAXALT-MLT) 10 MG disintegrating tablet Take 10 mg by mouth daily as needed. May repeat in 2 hours if needed    . venlafaxine (EFFEXOR-XR) 150 MG 24 hr capsule Take 150 mg by mouth every morning.     Marland Kitchen VICTOZA 18 MG/3ML SOPN INJECT 1.8 MG SUBCUTANEOUSLY DAILY. 9 mL 2   No current facility-administered medications on file prior to visit.    Allergies  Allergen Reactions  . Betadine [Povidone Iodine] Hives  . Penicillins Rash    Family History  Problem Relation Age of Onset  . Cancer Neg Hx   . Anesthesia problems Neg Hx   . Hypotension Neg Hx   . Malignant hyperthermia Neg Hx   . Pseudochol deficiency Neg Hx   . Heart disease Mother   . Asthma Mother   . COPD Father   . Heart disease Father   . Asthma Daughter   . Migraines Daughter   . Neuropathy Son     BP 122/70 mmHg  Pulse 92  Temp(Src) 97.8 F (36.6 C) (Oral)  Ht 5\' 4"  (1.626 m)  Wt 189 lb (85.73 kg)  BMI 32.43 kg/m2  SpO2 94%  Review of Systems She denies hypoglycemia.  She has lost a few lbs.      Objective:   Physical Exam VITAL SIGNS:  See vs page GENERAL: no distress Pulses: dorsalis pedis intact bilat.   MSK: no deformity of the feet  CV: 1+ bilat leg edema, and bilat varicosities.  Skin: no ulcer on the feet. normal color and temp on the feet.  Neuro: sensation is intact to touch on the feet.    A1c=5.9%    Assessment & Plan:  DM: well-controlled Edema, new, prob due to pioglitizone  Patient is advised the following: Patient Instructions  Please stop taking the pioglitizone pill.  In 1 month, resume it at 15 mg  daily.  i have sent a prescription to your pharmacy check your blood sugar 1 time a day.  vary the time of day when you check, between before the 3 meals, and at bedtime.  also check if you have symptoms of your blood sugar being too high or too low.  please keep a record of the readings and bring it to your next  appointment here.  please call us sooner if your blood sugar goes below 70, or if it stays over 200.   Please come [facility] for a follow-up appointment in 4 months.

## 2015-09-04 ENCOUNTER — Other Ambulatory Visit: Payer: Self-pay | Admitting: Cardiology

## 2015-09-15 ENCOUNTER — Ambulatory Visit (INDEPENDENT_AMBULATORY_CARE_PROVIDER_SITE_OTHER): Payer: Medicare Other | Admitting: Cardiology

## 2015-09-15 ENCOUNTER — Encounter: Payer: Self-pay | Admitting: Cardiology

## 2015-09-15 VITALS — BP 118/78 | HR 100 | Ht 66.0 in | Wt 190.0 lb

## 2015-09-15 DIAGNOSIS — E7800 Pure hypercholesterolemia, unspecified: Secondary | ICD-10-CM

## 2015-09-15 DIAGNOSIS — R002 Palpitations: Secondary | ICD-10-CM | POA: Diagnosis not present

## 2015-09-15 DIAGNOSIS — R600 Localized edema: Secondary | ICD-10-CM | POA: Diagnosis not present

## 2015-09-15 DIAGNOSIS — Z136 Encounter for screening for cardiovascular disorders: Secondary | ICD-10-CM | POA: Diagnosis not present

## 2015-09-15 DIAGNOSIS — I10 Essential (primary) hypertension: Secondary | ICD-10-CM

## 2015-09-15 NOTE — Patient Instructions (Signed)
Your physician wants you to follow-up in: [place] DR. BRANCH. You will receive a reminder letter in the mail two months in advance. If you don't receive a letter, please call our office to schedule the follow-up appointment.  Your physician recommends that you continue on your current medications as directed. Please refer to the Current Medication list given to you today.  Your physician has requested that you regularly monitor and record your blood pressure readings at home. Please use the same machine at the same time of day to check your readings and record them to bring to your follow-up visit with your PCP.   Thanks for choosing [facility]!!!

## 2015-09-15 NOTE — Progress Notes (Signed)
Patient ID: [name] White, female   DOB: 03/04/1941, 74 y.o.   MRN: [id number]     Clinical Summary [name] White is a 74 y.o.female seen today for follow up of the following medical problems.   1. Palpitations - compliant with beta blocker.  - denies any recent palpitations, has done well since starting coreg  2. HTN - compliant with meds - checks bp daily, typically around 100-120s/60s  3. Hyperlipidemia - compliant with statin - last lipid panel 08/2014 TC 126 TG 117 HDL 50 LDL 52  4. LE edema - from endocrine notes some thoughts pioglitoazone may be causing edema. Having a trial off pioglitazone.  - 06/2015 [place] no DVT - echo 06/2014 LVEF 60-65%, grade II diastolic dysfunction. Has some LE edema but no SOB/DOE, no orthopnea or PND. Has lasix to take prn - also followe dby vascular, sounds like she may have some venous insufficiency as well per her description.  Past Medical History  Diagnosis Date  . Varicose veins   . 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 HYPOTHYROIDISM 09/14/2007  . HYPERCHOLESTEROLEMIA 02/09/2009  . DIABETES MELLITUS, TYPE II 09/14/2007  . MIGRAINE HEADACHE 09/14/2007  . HYPERTENSION 02/09/2009  . SUPERFICIAL PHLEBITIS 09/14/2007  . PANCREATITIS 09/14/2007  . OSTEOPOROSIS 02/09/2009  . ASYMPTOMATIC POSTMENOPAUSAL STATUS 02/09/2009  . PONV (postoperative nausea and vomiting)   . Shortness of breath   . Asthma   . Neuropathy   . Rectocele 03/19/2013  . Constipation 03/19/2013     Allergies  Allergen Reactions  . Betadine [Povidone Iodine] Hives  . Penicillins Rash     Current Outpatient Prescriptions  Medication Sig Dispense Refill  . ACCU-CHEK AVIVA PLUS test strip     . albuterol (PROVENTIL HFA;VENTOLIN HFA) 108 (90 BASE) MCG/ACT inhaler Inhale 2 puffs into the lungs every 4 (four) hours as needed.      Marland Kitchen aspirin 81 MG tablet Take 81 mg by mouth daily.    Marland Kitchen atorvastatin (LIPITOR) 40 MG tablet TAKE ONE (1) TABLET EACH DAY 90 tablet 3  . baclofen (LIORESAL) 10 MG  tablet Take 10 mg by mouth daily.    . carvedilol (COREG) 3.125 MG tablet TAKE ONE TABLET TWICE DAILY 180 tablet 3  . cycloSPORINE (RESTASIS) 0.05 % ophthalmic emulsion Place 1 drop into both eyes 2 (two) times daily.      Marland Kitchen estradiol (ESTRACE) 1 MG tablet Take 1 tablet (1 mg total) by mouth daily. 30 tablet 11  . Fluticasone Furoate-Vilanterol 100-25 MCG/INH AEPB Inhale 1 puff into the lungs daily.    . furosemide (LASIX) 20 MG tablet May take Lasix 20 mg as NEEDED for leg swelling 45 tablet 3  . Hydrocodone-Acetaminophen 2.5-325 MG TABS Take 5-325 mg by mouth as needed.    . Insulin Pen Needle (NOVOTWIST) 32G X 5 MM MISC Use as directed once a day.   V250.00 100 each 6  . levothyroxine (SYNTHROID, LEVOTHROID) 50 MCG tablet TAKE ONE (1) TABLET EACH DAY 30 tablet 2  . lisinopril (PRINIVIL,ZESTRIL) 5 MG tablet Take 5 mg by mouth every morning.      . Magnesium 100 MG CAPS Take 100 mg by mouth once.    . metFORMIN (GLUCOPHAGE-XR) 500 MG 24 hr tablet TAKE TWO TABLETS BY MOUTH DAILY 60 tablet 2  . Multiple Vitamins-Minerals (MULTIVITAMINS THER. W/MINERALS) TABS Take 1 tablet by mouth every morning.      Marland Kitchen NOVOFINE 32G X 6 MM MISC USE ONCE DAILY 100 each 3  . omeprazole (PRILOSEC) 20  MG capsule Take 20 mg by mouth every morning.     . pioglitazone (ACTOS) 15 MG tablet Take 1 tablet (15 mg total) by mouth daily. 90 tablet 3  . pregabalin (LYRICA) 150 MG capsule Take 150 mg by mouth 2 (two) times daily.     . rizatriptan (MAXALT-MLT) 10 MG disintegrating tablet Take 10 mg by mouth daily as needed. May repeat in 2 hours if needed    . venlafaxine (EFFEXOR-XR) 150 MG 24 hr capsule Take 150 mg by mouth every morning.     Marland Kitchen VICTOZA 18 MG/3ML SOPN INJECT 1.8 MG SUBCUTANEOUSLY DAILY. 9 mL 2   No current facility-administered medications for this visit.     Past Surgical History  Procedure Laterality Date  . Abdominal hysterectomy    . Nm esophageal reflux  08-11-11  . Transphenoidal / transnasal  hypophysectomy / resection pituitary tumor  08-11-11  . Cholecystectomy    . Pituitary excision  10/1997  . Endovenous ablation saphenous vein w/ laser  11-03-2011   right greater saphenous vein    left leg done 10-2011  . Eye surgery  98    right cataract extraction 98  . Cataract extraction w/phaco  12/05/2011    Procedure: CATARACT EXTRACTION PHACO AND INTRAOCULAR LENS PLACEMENT (IOC);  Surgeon: Tonny Branch;  Location: [facility];  Service: Ophthalmology;  Laterality: Left;  CDE:9.65  . Posterior repair    . Cystoscopy    . Laparoscopic nissen fundoplication    . Anterior and posterior repair N/A 04/02/2013    Procedure: ANTERIOR (CYSTOCELE) AND POSTERIOR REPAIR (RECTOCELE);  Surgeon: Jonnie Kind, MD;  Location: [facility];  Service: Gynecology;  Laterality: N/A;     Allergies  Allergen Reactions  . Betadine [Povidone Iodine] Hives  . Penicillins Rash      Family History  Problem Relation Age of Onset  . Cancer Neg Hx   . Anesthesia problems Neg Hx   . Hypotension Neg Hx   . Malignant hyperthermia Neg Hx   . Pseudochol deficiency Neg Hx   . Heart disease Mother   . Asthma Mother   . COPD Father   . Heart disease Father   . Asthma Daughter   . Migraines Daughter   . Neuropathy Son      Social History [name] White reports that she has never smoked. She has never used smokeless tobacco. [name] White reports that she does not drink alcohol.   Review of Systems CONSTITUTIONAL: No weight loss, fever, chills, weakness or fatigue.  HEENT: Eyes: No visual loss, blurred vision, double vision or yellow sclerae.No hearing loss, sneezing, congestion, runny nose or sore throat.  SKIN: No rash or itching.  CARDIOVASCULAR: no chest pain, no palpitations RESPIRATORY: No shortness of breath, cough or sputum.  GASTROINTESTINAL: No anorexia, nausea, vomiting or diarrhea. No abdominal pain or blood.  GENITOURINARY: No burning on urination, no polyuria NEUROLOGICAL: No headache, dizziness,  syncope, paralysis, ataxia, numbness or tingling in the extremities. No change in bowel or bladder control.  MUSCULOSKELETAL: No muscle, back pain, joint pain or stiffness.  LYMPHATICS: No enlarged nodes. No history of splenectomy.  PSYCHIATRIC: No history of depression or anxiety.  ENDOCRINOLOGIC: No reports of sweating, cold or heat intolerance. No polyuria or polydipsia.  Marland Kitchen   Physical Examination Filed Vitals:   09/15/15 0810  BP: 118/78  Pulse: 100   Filed Vitals:   09/15/15 0810  Height: 5\' 6"  (1.676 m)  Weight: 190 lb (86.183 kg)    Gen:  resting comfortably, no acute distress HEENT: no scleral icterus, pupils equal round and reactive, no palptable cervical adenopathy,  CV: RRR, no m/r/g, no jvd Resp: Clear to auscultation bilaterally GI: abdomen is soft, non-tender, non-distended, normal bowel sounds, no hepatosplenomegaly MSK: extremities are warm, no edema.  Skin: warm, no rash Neuro:  no focal deficits Psych: appropriate affect   Diagnostic Studies 06/2014 Echo Study Conclusions  - Left ventricle: The cavity size was normal. Wall thickness was increased in a pattern of mild LVH. Systolic function was normal. The estimated ejection fraction was in the range of 60% to 65%. Wall motion was normal; there were no regional wall motion abnormalities. Features are consistent with a pseudonormal left ventricular filling pattern, with concomitant abnormal relaxation and increased filling pressure (grade 2 diastolic dysfunction). - Aortic valve: There was trivial regurgitation. - Mitral valve: Calcified annulus. There was mild regurgitation. - Left atrium: The atrium was moderately dilated. - Right atrium: Central venous pressure (est): 3 mm Hg. - Tricuspid valve: There was trivial regurgitation. - Pulmonary arteries: Systolic pressure could not be accurately estimated. - Pericardium, extracardiac: There was no pericardial effusion.  Impressions:  - Mild LVH with  LVEF 75-44%, grade 2 diastolic dysfunction. Moderate left atrial enlargement. MAC with mild mitral regurgitation. Unable to assess PASP.    Assessment and Plan  1. Palpitations - no recent symptoms, continue current meds  2. HTN - at goal, continue current meds.   3. Hyperlipidemia - at goal, continue statin - request most recent labs from pcp  4. LE edema -  continue prn lasix and compression stockings   F/u 1 year       Arnoldo Lenis, M.D.

## 2015-09-17 ENCOUNTER — Other Ambulatory Visit: Payer: Self-pay | Admitting: Endocrinology

## 2015-09-19 ENCOUNTER — Other Ambulatory Visit: Payer: Self-pay | Admitting: Endocrinology

## 2015-09-23 ENCOUNTER — Other Ambulatory Visit: Payer: Self-pay | Admitting: Endocrinology

## 2015-12-23 ENCOUNTER — Other Ambulatory Visit: Payer: Self-pay | Admitting: Obstetrics and Gynecology

## 2015-12-23 NOTE — Telephone Encounter (Signed)
Needs an appt, has been a year

## 2015-12-30 ENCOUNTER — Telehealth: Payer: Self-pay | Admitting: Obstetrics and Gynecology

## 2015-12-31 MED ORDER — ESTRADIOL 1 MG PO TABS: 1.0000 mg | ORAL_TABLET | Freq: Every day | ORAL | Status: DC

## 2015-12-31 NOTE — Telephone Encounter (Signed)
Pt informed that appt needed annually to manage medications, even though pelvic exam not required. Will refil Estrace x 1 month to allow time for visit.

## 2016-01-04 ENCOUNTER — Ambulatory Visit: Payer: Medicare Other | Admitting: Endocrinology

## 2016-01-06 ENCOUNTER — Ambulatory Visit (INDEPENDENT_AMBULATORY_CARE_PROVIDER_SITE_OTHER): Payer: Medicare Other | Admitting: Endocrinology

## 2016-01-06 ENCOUNTER — Encounter: Payer: Self-pay | Admitting: Endocrinology

## 2016-01-06 VITALS — BP 120/70 | HR 86 | Temp 98.4°F | Ht 66.0 in | Wt 184.0 lb

## 2016-01-06 DIAGNOSIS — E119 Type 2 diabetes mellitus without complications: Secondary | ICD-10-CM | POA: Diagnosis not present

## 2016-01-06 LAB — POCT GLYCOSYLATED HEMOGLOBIN (HGB A1C): Hemoglobin A1C: 6.2

## 2016-01-06 NOTE — Patient Instructions (Addendum)
Please stop taking the pioglitizone, and continue the same other diabetes meds.  If you are unable to afford the victoza, an alternative med (not as good), would be a pill called "repaglinide." check your blood sugar once a day.  vary the time of day when you check, between before the 3 meals, and at bedtime.  also check if you have symptoms of your blood sugar being too high or too low.  please keep a record of the readings and bring it to your next appointment here (or you can bring the meter itself).  You can write it on any piece of paper.  please call us sooner if your blood sugar goes below 70, or if you have a lot of readings over 200. Please come back for a follow-up appointment in 3-4 months.

## 2016-01-06 NOTE — Progress Notes (Signed)
Subjective:    Patient ID: [name] White, female    DOB: 01/12/1941, 75 y.o.   MRN: [id number]  HPI Pt returns for f/u of diabetes mellitus: DM type: 2 Dx'ed: 1999, after an episode of pancreatitis (no pancreatitis since then).  Complications: none Therapy: 2 oral agents+victoza GDM: never DKA: never Severe hypoglycemia: never Other: she has never taken insulin; she cannot take bromocriptine, due to an interaction with maxalt; pioglitizone has caused edema Interval history: no cbg record, but states cbg's are well-controlled.  Edema is much better, with the reduction of pioglitizone.  Past Medical History  Diagnosis Date  . Varicose veins   . 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 HYPOTHYROIDISM 09/14/2007  . HYPERCHOLESTEROLEMIA 02/09/2009  . DIABETES MELLITUS, TYPE II 09/14/2007  . MIGRAINE HEADACHE 09/14/2007  . HYPERTENSION 02/09/2009  . SUPERFICIAL PHLEBITIS 09/14/2007  . PANCREATITIS 09/14/2007  . OSTEOPOROSIS 02/09/2009  . ASYMPTOMATIC POSTMENOPAUSAL STATUS 02/09/2009  . PONV (postoperative nausea and vomiting)   . Shortness of breath   . Asthma   . Neuropathy ([place])   . Rectocele 03/19/2013  . Constipation 03/19/2013    Past Surgical History  Procedure Laterality Date  . Abdominal hysterectomy    . Nm esophageal reflux  08-11-11  . Transphenoidal / transnasal hypophysectomy / resection pituitary tumor  08-11-11  . Cholecystectomy    . Pituitary excision  10/1997  . Endovenous ablation saphenous vein w/ laser  11-03-2011   right greater saphenous vein    left leg done 10-2011  . Eye surgery  98    right cataract extraction 98  . Cataract extraction w/phaco  12/05/2011    Procedure: CATARACT EXTRACTION PHACO AND INTRAOCULAR LENS PLACEMENT (IOC);  Surgeon: Tonny Branch;  Location: [facility];  Service: Ophthalmology;  Laterality: Left;  CDE:9.65  . Posterior repair    . Cystoscopy    . Laparoscopic nissen fundoplication    . Anterior and posterior repair N/A 04/02/2013    Procedure:  ANTERIOR (CYSTOCELE) AND POSTERIOR REPAIR (RECTOCELE);  Surgeon: Jonnie Kind, MD;  Location: [facility];  Service: Gynecology;  Laterality: N/A;    Social History   Social History  . Marital Status: Married    Spouse Name: N/A  . Number of Children: N/A  . Years of Education: N/A   Occupational History  . Retired    Social History Main Topics  . Smoking status: Never Smoker   . Smokeless tobacco: Never Used  . Alcohol Use: No  . Drug Use: No  . Sexual Activity: Yes    Birth Control/ Protection: Surgical   Other Topics Concern  . Not on file   Social History Narrative    Current Outpatient Prescriptions on File Prior to Visit  Medication Sig Dispense Refill  . ACCU-CHEK AVIVA PLUS test strip     . albuterol (PROVENTIL HFA;VENTOLIN HFA) 108 (90 BASE) MCG/ACT inhaler Inhale 2 puffs into the lungs every 4 (four) hours as needed.      Marland Kitchen aspirin 81 MG tablet Take 81 mg by mouth daily.    Marland Kitchen atorvastatin (LIPITOR) 40 MG tablet TAKE ONE (1) TABLET EACH DAY 90 tablet 3  . baclofen (LIORESAL) 10 MG tablet Take 10 mg by mouth daily.    . carvedilol (COREG) 3.125 MG tablet TAKE ONE TABLET TWICE DAILY 180 tablet 3  . cycloSPORINE (RESTASIS) 0.05 % ophthalmic emulsion Place 1 drop into both eyes 2 (two) times daily.      Marland Kitchen estradiol (ESTRACE) 1 MG tablet Take 1  tablet (1 mg total) by mouth daily. 30 tablet 1  . Fluticasone Furoate-Vilanterol 100-25 MCG/INH AEPB Inhale 1 puff into the lungs daily.    . furosemide (LASIX) 20 MG tablet May take Lasix 20 mg as NEEDED for leg swelling 45 tablet 3  . Hydrocodone-Acetaminophen 2.5-325 MG TABS Take 5-325 mg by mouth as needed.    . Insulin Pen Needle (NOVOTWIST) 32G X 5 MM MISC Use as directed once a day.   V250.00 100 each 6  . levothyroxine (SYNTHROID, LEVOTHROID) 50 MCG tablet TAKE ONE TABLET ONCE DAILY 30 tablet 3  . lisinopril (PRINIVIL,ZESTRIL) 5 MG tablet Take 5 mg by mouth every morning.      . Magnesium 100 MG CAPS Take 100 mg by  mouth once.    . metFORMIN (GLUCOPHAGE-XR) 500 MG 24 hr tablet TAKE TWO TABLETS BY MOUTH DAILY 60 tablet 2  . Multiple Vitamins-Minerals (MULTIVITAMINS THER. W/MINERALS) TABS Take 1 tablet by mouth every morning.      Marland Kitchen NOVOFINE 32G X 6 MM MISC USE ONCE DAILY 100 each 3  . omeprazole (PRILOSEC) 20 MG capsule Take 20 mg by mouth every morning.     . pregabalin (LYRICA) 150 MG capsule Take 150 mg by mouth 2 (two) times daily.     . rizatriptan (MAXALT-MLT) 10 MG disintegrating tablet Take 10 mg by mouth daily as needed. May repeat in 2 hours if needed    . venlafaxine (EFFEXOR-XR) 150 MG 24 hr capsule Take 150 mg by mouth every morning.     Marland Kitchen VICTOZA 18 MG/3ML SOPN INJECT 1.8MG  SUBCUTANEOUSLY DAILY 9 mL 2   No current facility-administered medications on file prior to visit.    Allergies  Allergen Reactions  . Betadine [Povidone Iodine] Hives  . Penicillins Rash    Family History  Problem Relation Age of Onset  . Cancer Neg Hx   . Anesthesia problems Neg Hx   . Hypotension Neg Hx   . Malignant hyperthermia Neg Hx   . Pseudochol deficiency Neg Hx   . Heart disease Mother   . Asthma Mother   . COPD Father   . Heart disease Father   . Asthma Daughter   . Migraines Daughter   . Neuropathy Son     BP 120/70 mmHg  Pulse 86  Temp(Src) 98.4 F (36.9 C) (Oral)  Ht 5\' 6"  (1.676 m)  Wt 184 lb (83.462 kg)  BMI 29.71 kg/m2  SpO2 90%  Review of Systems She denies hypoglycemia.      Objective:   Physical Exam VITAL SIGNS:  See vs page GENERAL: no distress Pulses: dorsalis pedis intact bilat.   MSK: no deformity of the feet CV: 1+ bilat leg edema Skin:  no ulcer on the feet.  normal color and temp on the feet. Neuro: sensation is intact to touch on the feet  A1c=6.2%    Assessment & Plan:  DM: well-controlled.   Edema, persistent despite reduction of the pioglitizone.    Patient is advised the following: Patient Instructions  Please stop taking the pioglitizone, and  continue the same other diabetes meds.  If you are unable to afford the victoza, an alternative med (not as good), would be a pill called "repaglinide." check your blood sugar once a day.  vary the time of day when you check, between before the 3 meals, and at bedtime.  also check if you have symptoms of your blood sugar being too high or too low.  please keep a record  of the readings and bring it to your next appointment here (or you can bring the meter itself).  You can write it on any piece of paper.  please call us sooner if your blood sugar goes below 70, or if you have a lot of readings over 200. Please come [place] for a follow-up appointment in 3-4 months.

## 2016-01-12 ENCOUNTER — Encounter: Payer: Self-pay | Admitting: Obstetrics and Gynecology

## 2016-01-12 ENCOUNTER — Ambulatory Visit (INDEPENDENT_AMBULATORY_CARE_PROVIDER_SITE_OTHER): Payer: Medicare Other | Admitting: Obstetrics and Gynecology

## 2016-01-12 VITALS — BP 100/60 | Ht 66.0 in | Wt 186.0 lb

## 2016-01-12 DIAGNOSIS — Z79890 Hormone replacement therapy (postmenopausal): Secondary | ICD-10-CM

## 2016-01-12 MED ORDER — ESTRADIOL 0.5 MG PO TABS: 1.0000 mg | ORAL_TABLET | Freq: Every day | ORAL | Status: DC

## 2016-01-12 NOTE — Progress Notes (Signed)
Patient ID: [name] White, female   DOB: October 09, 1941, 75 y.o.   MRN: [id number] Pt here today to discuss estradiol.

## 2016-01-12 NOTE — Progress Notes (Signed)
[facility] Visit  Patient name: [name] White MRN [id number]  Date of birth: 08-06-1941  CC & HPI:  [name] White is a 75 y.o. female presenting today for review of tx of vasomotor sx with estrogens oral currently on 1 mg estradiol daily. Prior efforts to d/c estrogens met with lots of vasomotor sx.  ROS:  No hx clots, stroke CAD, or immobility  Pertinent History Reviewed:   Reviewed: Significant for  Medical         Past Medical History  Diagnosis Date  . Varicose veins   . 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 HYPOTHYROIDISM 09/14/2007  . HYPERCHOLESTEROLEMIA 02/09/2009  . DIABETES MELLITUS, TYPE II 09/14/2007  . MIGRAINE HEADACHE 09/14/2007  . HYPERTENSION 02/09/2009  . SUPERFICIAL PHLEBITIS 09/14/2007  . PANCREATITIS 09/14/2007  . OSTEOPOROSIS 02/09/2009  . ASYMPTOMATIC POSTMENOPAUSAL STATUS 02/09/2009  . PONV (postoperative nausea and vomiting)   . Shortness of breath   . Asthma   . Neuropathy ([place])   . Rectocele 03/19/2013  . Constipation 03/19/2013                              Surgical Hx:    Past Surgical History  Procedure Laterality Date  . Abdominal hysterectomy    . Nm esophageal reflux  08-11-11  . Transphenoidal / transnasal hypophysectomy / resection pituitary tumor  08-11-11  . Cholecystectomy    . Pituitary excision  10/1997  . Endovenous ablation saphenous vein w/ laser  11-03-2011   right greater saphenous vein    left leg done 10-2011  . Eye surgery  98    right cataract extraction 98  . Cataract extraction w/phaco  12/05/2011    Procedure: CATARACT EXTRACTION PHACO AND INTRAOCULAR LENS PLACEMENT (IOC);  Surgeon: Tonny Branch;  Location: [facility];  Service: Ophthalmology;  Laterality: Left;  CDE:9.65  . Posterior repair    . Cystoscopy    . Laparoscopic nissen fundoplication    . Anterior and posterior repair N/A 04/02/2013    Procedure: ANTERIOR (CYSTOCELE) AND POSTERIOR REPAIR (RECTOCELE);  Surgeon: Jonnie Kind, MD;  Location: [facility];  Service:  Gynecology;  Laterality: N/A;   Medications: Reviewed & Updated - see associated section                       Current outpatient prescriptions:  .  ACCU-CHEK AVIVA PLUS test strip, , Disp: , Rfl:  .  albuterol (PROVENTIL HFA;VENTOLIN HFA) 108 (90 BASE) MCG/ACT inhaler, Inhale 2 puffs into the lungs every 4 (four) hours as needed.  , Disp: , Rfl:  .  aspirin 81 MG tablet, Take 81 mg by mouth daily., Disp: , Rfl:  .  atorvastatin (LIPITOR) 40 MG tablet, TAKE ONE (1) TABLET EACH DAY, Disp: 90 tablet, Rfl: 3 .  baclofen (LIORESAL) 10 MG tablet, Take 10 mg by mouth daily., Disp: , Rfl:  .  carvedilol (COREG) 3.125 MG tablet, TAKE ONE TABLET TWICE DAILY, Disp: 180 tablet, Rfl: 3 .  cycloSPORINE (RESTASIS) 0.05 % ophthalmic emulsion, Place 1 drop into both eyes 2 (two) times daily.  , Disp: , Rfl:  .  estradiol (ESTRACE) 0.5 MG tablet, Take 2 tablets (1 mg total) by mouth daily., Disp: 90 tablet, Rfl: 3 .  Fluticasone Furoate-Vilanterol 100-25 MCG/INH AEPB, Inhale 1 puff into the lungs daily., Disp: , Rfl:  .  furosemide (LASIX) 20 MG tablet, May take Lasix 20  mg as NEEDED for leg swelling, Disp: 45 tablet, Rfl: 3 .  Hydrocodone-Acetaminophen 2.5-325 MG TABS, Take 5-325 mg by mouth as needed., Disp: , Rfl:  .  Insulin Pen Needle (NOVOTWIST) 32G X 5 MM MISC, Use as directed once a day.   V250.00, Disp: 100 each, Rfl: 6 .  levothyroxine (SYNTHROID, LEVOTHROID) 50 MCG tablet, TAKE ONE TABLET ONCE DAILY, Disp: 30 tablet, Rfl: 3 .  lisinopril (PRINIVIL,ZESTRIL) 5 MG tablet, Take 5 mg by mouth every morning.  , Disp: , Rfl:  .  Magnesium 100 MG CAPS, Take 100 mg by mouth once., Disp: , Rfl:  .  metFORMIN (GLUCOPHAGE-XR) 500 MG 24 hr tablet, TAKE TWO TABLETS BY MOUTH DAILY, Disp: 60 tablet, Rfl: 2 .  Multiple Vitamins-Minerals (MULTIVITAMINS THER. W/MINERALS) TABS, Take 1 tablet by mouth every morning.  , Disp: , Rfl:  .  NOVOFINE 32G X 6 MM MISC, USE ONCE DAILY, Disp: 100 each, Rfl: 3 .  omeprazole  (PRILOSEC) 20 MG capsule, Take 20 mg by mouth every morning. , Disp: , Rfl:  .  pregabalin (LYRICA) 150 MG capsule, Take 150 mg by mouth 2 (two) times daily. , Disp: , Rfl:  .  rizatriptan (MAXALT-MLT) 10 MG disintegrating tablet, Take 10 mg by mouth daily as needed. May repeat in 2 hours if needed, Disp: , Rfl:  .  venlafaxine (EFFEXOR-XR) 150 MG 24 hr capsule, Take 150 mg by mouth every morning. , Disp: , Rfl:  .  VICTOZA 18 MG/3ML SOPN, INJECT 1.8MG SUBCUTANEOUSLY DAILY, Disp: 9 mL, Rfl: 2   Social History: Reviewed -  reports that she has never smoked. She has never used smokeless tobacco.  Objective Findings:  Vitals: Blood pressure 100/60, height _0  (1.676 m), weight 186 lb (84.369 kg).  Physical Examination: General appearance - alert, well appearing, and in no distress, oriented to person, place, and time, overweight and playful, active Mental status - alert, oriented to person, place, and time, normal mood, behavior, speech, dress, motor activity, and thought processes   Assessment & Plan:   A:  1. Postmenopausal vasomotor sx. Pt a candidate for reducing dosing of po estrogen due to Decr in metabolism  P:  1. Esradiol 0.5 mg daily

## 2016-01-20 ENCOUNTER — Other Ambulatory Visit: Payer: Self-pay | Admitting: Endocrinology

## 2016-01-21 ENCOUNTER — Other Ambulatory Visit: Payer: Self-pay | Admitting: Endocrinology

## 2016-02-10 ENCOUNTER — Other Ambulatory Visit: Payer: Self-pay | Admitting: Endocrinology

## 2016-02-25 DIAGNOSIS — H52223 Regular astigmatism, bilateral: Secondary | ICD-10-CM | POA: Diagnosis not present

## 2016-02-25 DIAGNOSIS — E11319 Type 2 diabetes mellitus with unspecified diabetic retinopathy without macular edema: Secondary | ICD-10-CM | POA: Diagnosis not present

## 2016-02-25 DIAGNOSIS — H5203 Hypermetropia, bilateral: Secondary | ICD-10-CM | POA: Diagnosis not present

## 2016-02-25 DIAGNOSIS — H524 Presbyopia: Secondary | ICD-10-CM | POA: Diagnosis not present

## 2016-04-11 ENCOUNTER — Encounter: Payer: Self-pay | Admitting: Endocrinology

## 2016-04-11 ENCOUNTER — Ambulatory Visit (INDEPENDENT_AMBULATORY_CARE_PROVIDER_SITE_OTHER): Payer: Medicare Other | Admitting: Endocrinology

## 2016-04-11 VITALS — BP 112/72 | HR 108 | Temp 98.6°F | Ht 66.0 in | Wt 178.0 lb

## 2016-04-11 DIAGNOSIS — E119 Type 2 diabetes mellitus without complications: Secondary | ICD-10-CM | POA: Diagnosis not present

## 2016-04-11 LAB — MICROALBUMIN / CREATININE URINE RATIO
Creatinine,U: 80.8 mg/dL
Microalb Creat Ratio: 0.9 mg/g (ref 0.0–30.0)

## 2016-04-11 LAB — POCT GLYCOSYLATED HEMOGLOBIN (HGB A1C): Hemoglobin A1C: 7.2

## 2016-04-11 MED ORDER — REPAGLINIDE 0.5 MG PO TABS: 0.5000 mg | ORAL_TABLET | Freq: Three times a day (TID) | ORAL | Status: DC

## 2016-04-11 NOTE — Progress Notes (Signed)
Subjective:    Patient ID: [name] White, female    DOB: 1941/02/19, 75 y.o.   MRN: [id number]  HPI Pt returns for f/u of diabetes mellitus: DM type: 2 Dx'ed: 1999, after an episode of pancreatitis (no pancreatitis since then).  Complications: none Therapy: metformin+victoza.  GDM: never DKA: never Severe hypoglycemia: never Other: she has never taken insulin; she cannot take bromocriptine, due to an interaction with maxalt; pioglitizone has caused edema.   Interval history: no cbg record, but states cbg's are well-controlled.    Past Medical History  Diagnosis Date  . Varicose veins   . 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 HYPOTHYROIDISM 09/14/2007  . HYPERCHOLESTEROLEMIA 02/09/2009  . DIABETES MELLITUS, TYPE II 09/14/2007  . MIGRAINE HEADACHE 09/14/2007  . HYPERTENSION 02/09/2009  . SUPERFICIAL PHLEBITIS 09/14/2007  . PANCREATITIS 09/14/2007  . OSTEOPOROSIS 02/09/2009  . ASYMPTOMATIC POSTMENOPAUSAL STATUS 02/09/2009  . PONV (postoperative nausea and vomiting)   . Shortness of breath   . Asthma   . Neuropathy ([place])   . Rectocele 03/19/2013  . Constipation 03/19/2013    Past Surgical History  Procedure Laterality Date  . Abdominal hysterectomy    . Nm esophageal reflux  08-11-11  . Transphenoidal / transnasal hypophysectomy / resection pituitary tumor  08-11-11  . Cholecystectomy    . Pituitary excision  10/1997  . Endovenous ablation saphenous vein w/ laser  11-03-2011   right greater saphenous vein    left leg done 10-2011  . Eye surgery  98    right cataract extraction 98  . Cataract extraction w/phaco  12/05/2011    Procedure: CATARACT EXTRACTION PHACO AND INTRAOCULAR LENS PLACEMENT (IOC);  Surgeon: Tonny Branch;  Location: [facility];  Service: Ophthalmology;  Laterality: Left;  CDE:9.65  . Posterior repair    . Cystoscopy    . Laparoscopic nissen fundoplication    . Anterior and posterior repair N/A 04/02/2013    Procedure: ANTERIOR (CYSTOCELE) AND POSTERIOR REPAIR (RECTOCELE);   Surgeon: Jonnie Kind, MD;  Location: [facility];  Service: Gynecology;  Laterality: N/A;    Social History   Social History  . Marital Status: Married    Spouse Name: N/A  . Number of Children: N/A  . Years of Education: N/A   Occupational History  . Retired    Social History Main Topics  . Smoking status: Never Smoker   . Smokeless tobacco: Never Used  . Alcohol Use: No  . Drug Use: No  . Sexual Activity: Yes    Birth Control/ Protection: Surgical   Other Topics Concern  . Not on file   Social History Narrative    Current Outpatient Prescriptions on File Prior to Visit  Medication Sig Dispense Refill  . ACCU-CHEK AVIVA PLUS test strip     . albuterol (PROVENTIL HFA;VENTOLIN HFA) 108 (90 BASE) MCG/ACT inhaler Inhale 2 puffs into the lungs every 4 (four) hours as needed.      Marland Kitchen aspirin 81 MG tablet Take 81 mg by mouth daily.    Marland Kitchen atorvastatin (LIPITOR) 40 MG tablet TAKE ONE (1) TABLET EACH DAY 90 tablet 3  . baclofen (LIORESAL) 10 MG tablet Take 10 mg by mouth daily.    . carvedilol (COREG) 3.125 MG tablet TAKE ONE TABLET TWICE DAILY 180 tablet 3  . cycloSPORINE (RESTASIS) 0.05 % ophthalmic emulsion Place 1 drop into both eyes 2 (two) times daily.      Marland Kitchen estradiol (ESTRACE) 0.5 MG tablet Take 2 tablets (1 mg total) by mouth daily.  90 tablet 3  . Fluticasone Furoate-Vilanterol 100-25 MCG/INH AEPB Inhale 1 puff into the lungs daily.    . furosemide (LASIX) 20 MG tablet May take Lasix 20 mg as NEEDED for leg swelling 45 tablet 3  . Hydrocodone-Acetaminophen 2.5-325 MG TABS Take 5-325 mg by mouth as needed.    . Insulin Pen Needle (NOVOTWIST) 32G X 5 MM MISC Use as directed once a day.   V250.00 100 each 6  . levothyroxine (SYNTHROID, LEVOTHROID) 50 MCG tablet TAKE ONE (1) TABLET EACH DAY 30 tablet 2  . lisinopril (PRINIVIL,ZESTRIL) 5 MG tablet Take 5 mg by mouth every morning.      . Magnesium 100 MG CAPS Take 100 mg by mouth once.    . metFORMIN (GLUCOPHAGE-XR) 500 MG 24  hr tablet TAKE TWO TABLETS DAILY 60 tablet 2  . Multiple Vitamins-Minerals (MULTIVITAMINS THER. W/MINERALS) TABS Take 1 tablet by mouth every morning.      Marland Kitchen NOVOFINE 32G X 6 MM MISC USE ONCE DAILY 100 each 3  . omeprazole (PRILOSEC) 20 MG capsule Take 20 mg by mouth every morning.     . pregabalin (LYRICA) 150 MG capsule Take 150 mg by mouth 2 (two) times daily.     . rizatriptan (MAXALT-MLT) 10 MG disintegrating tablet Take 10 mg by mouth daily as needed. May repeat in 2 hours if needed    . venlafaxine (EFFEXOR-XR) 150 MG 24 hr capsule Take 150 mg by mouth every morning.     Marland Kitchen VICTOZA 18 MG/3ML SOPN INJECT 1.8MG  SUBCUTANEOUSLY DAILY 9 mL 2   No current facility-administered medications on file prior to visit.    Allergies  Allergen Reactions  . Betadine [Povidone Iodine] Hives  . Penicillins Rash    Family History  Problem Relation Age of Onset  . Cancer Neg Hx   . Anesthesia problems Neg Hx   . Hypotension Neg Hx   . Malignant hyperthermia Neg Hx   . Pseudochol deficiency Neg Hx   . Heart disease Mother   . Asthma Mother   . COPD Father   . Heart disease Father   . Asthma Daughter   . Migraines Daughter   . Neuropathy Son     BP 112/72 mmHg  Pulse 108  Temp(Src) 98.6 F (37 C) (Oral)  Ht 5\' 6"  (1.676 m)  Wt 178 lb (80.74 kg)  BMI 28.74 kg/m2  SpO2 90%  Review of Systems She denies hypoglycemia    Objective:   Physical Exam VITAL SIGNS:  See vs page GENERAL: no distress Pulses: dorsalis pedis intact bilat.   MSK: no deformity of the feet CV: no leg edema, but there are bilat varicosities of the feet.   Skin:  no ulcer on the feet.  normal color and temp on the feet. Neuro: sensation is intact to touch on the feet.   A1c=7.2%    Assessment & Plan:  DM: she needs increased rx, if it can be done with a regimen that avoids or minimizes hypoglycemia.   Patient is advised the following: Patient Instructions  i have sent a prescription to your pharmacy, to  add "repaglinide." check your blood sugar once a day.  vary the time of day when you check, between before the 3 meals, and at bedtime.  also check if you have symptoms of your blood sugar being too high or too low.  please keep a record of the readings and bring it to your next appointment here (or you can bring the  meter itself).  You can write it on any piece of paper.  please call us sooner if your blood sugar goes below 70, or if you have a lot of readings over 200. Please come White for a follow-up appointment in 4 months.

## 2016-04-11 NOTE — Patient Instructions (Signed)
i have sent a prescription to your pharmacy, to add "repaglinide." check your blood sugar once a day.  vary the time of day when you check, between before the 3 meals, and at bedtime.  also check if you have symptoms of your blood sugar being too high or too low.  please keep a record of the readings and bring it to your next appointment here (or you can bring the meter itself).  You can write it on any piece of paper.  please call us sooner if your blood sugar goes below 70, or if you have a lot of readings over 200. Please come back for a follow-up appointment in 4 months.

## 2016-04-22 ENCOUNTER — Other Ambulatory Visit: Payer: Self-pay | Admitting: Endocrinology

## 2016-05-18 ENCOUNTER — Other Ambulatory Visit: Payer: Self-pay | Admitting: Endocrinology

## 2016-06-08 ENCOUNTER — Other Ambulatory Visit: Payer: Self-pay

## 2016-06-08 MED ORDER — CARVEDILOL 3.125 MG PO TABS: 3.1250 mg | ORAL_TABLET | Freq: Two times a day (BID) | ORAL | Status: DC

## 2016-06-20 ENCOUNTER — Other Ambulatory Visit: Payer: Self-pay | Admitting: Endocrinology

## 2016-06-21 ENCOUNTER — Other Ambulatory Visit: Payer: Self-pay | Admitting: Endocrinology

## 2016-07-20 ENCOUNTER — Other Ambulatory Visit: Payer: Self-pay | Admitting: Endocrinology

## 2016-08-10 ENCOUNTER — Telehealth: Payer: Self-pay | Admitting: Endocrinology

## 2016-08-10 ENCOUNTER — Encounter: Payer: Self-pay | Admitting: Endocrinology

## 2016-08-10 ENCOUNTER — Other Ambulatory Visit: Payer: Self-pay | Admitting: Endocrinology

## 2016-08-10 ENCOUNTER — Ambulatory Visit (INDEPENDENT_AMBULATORY_CARE_PROVIDER_SITE_OTHER): Payer: Medicare Other | Admitting: Endocrinology

## 2016-08-10 VITALS — BP 103/63 | HR 88 | Temp 97.7°F | Resp 16 | Ht 66.0 in | Wt 176.0 lb

## 2016-08-10 DIAGNOSIS — E119 Type 2 diabetes mellitus without complications: Secondary | ICD-10-CM

## 2016-08-10 LAB — POCT GLYCOSYLATED HEMOGLOBIN (HGB A1C): Hemoglobin A1C: 7.3

## 2016-08-10 MED ORDER — REPAGLINIDE 1 MG PO TABS: 1.0000 mg | ORAL_TABLET | Freq: Three times a day (TID) | ORAL | 3 refills | Status: DC

## 2016-08-10 NOTE — Patient Instructions (Addendum)
i have sent a prescription to your pharmacy, to increase the repaglinide.  check your blood sugar once a day.  vary the time of day when you check, between before the 3 meals, and at bedtime.  also check if you have symptoms of your blood sugar being too high or too low.  please keep a record of the readings and bring it to your next appointment here (or you can bring the meter itself).  You can write it on any piece of paper.  please call us sooner if your blood sugar goes below 70, or if you have a lot of readings over 200. Please come back for a follow-up appointment in 3-4 months.

## 2016-08-10 NOTE — Progress Notes (Signed)
Subjective:    Patient ID: [name] White, female    DOB: 17-Jul-1941, 75 y.o.   MRN: [id number]  HPI Pt returns for f/u of diabetes mellitus: DM type: 2 Dx'ed: 1999, after an episode of pancreatitis (no pancreatitis since then).  Complications: none Therapy: 2 oral meds + victoza.  GDM: never DKA: never Severe hypoglycemia: never. Other: she has never taken insulin; she cannot take bromocriptine, due to an interaction with maxalt; pioglitizone has caused edema.   Interval history: no cbg record, but states cbg's vary from 150-200.  pt states she feels well in general.   Past Medical History:  Diagnosis Date  . Asthma   . ASYMPTOMATIC POSTMENOPAUSAL STATUS 02/09/2009  . Constipation 03/19/2013  . DIABETES MELLITUS, TYPE II 09/14/2007  . HYPERCHOLESTEROLEMIA 02/09/2009  . HYPERTENSION 02/09/2009  . HYPOTHYROIDISM 09/14/2007  . MIGRAINE HEADACHE 09/14/2007  . Neuropathy ([place])   . OSTEOPOROSIS 02/09/2009  . PANCREATITIS 09/14/2007  . 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 PONV (postoperative nausea and vomiting)   . Rectocele 03/19/2013  . Shortness of breath   . SUPERFICIAL PHLEBITIS 09/14/2007  . Varicose veins     Past Surgical History:  Procedure Laterality Date  . ABDOMINAL HYSTERECTOMY    . ANTERIOR AND POSTERIOR REPAIR N/A 04/02/2013   Procedure: ANTERIOR (CYSTOCELE) AND POSTERIOR REPAIR (RECTOCELE);  Surgeon: Jonnie Kind, MD;  Location: [facility];  Service: Gynecology;  Laterality: N/A;  . CATARACT EXTRACTION W/PHACO  12/05/2011   Procedure: CATARACT EXTRACTION PHACO AND INTRAOCULAR LENS PLACEMENT (IOC);  Surgeon: Tonny Branch;  Location: [facility];  Service: Ophthalmology;  Laterality: Left;  CDE:9.65  . CHOLECYSTECTOMY    . CYSTOSCOPY    . ENDOVENOUS ABLATION SAPHENOUS VEIN W/ LASER  11-03-2011   right greater saphenous vein   left leg done 10-2011  . EYE SURGERY  98   right cataract extraction 98  . LAPAROSCOPIC NISSEN FUNDOPLICATION    . NM ESOPHAGEAL REFLUX  08-11-11  . PITUITARY  EXCISION  10/1997  . POSTERIOR REPAIR    . TRANSPHENOIDAL / TRANSNASAL HYPOPHYSECTOMY / RESECTION PITUITARY TUMOR  08-11-11    Social History   Social History  . Marital status: Married    Spouse name: N/A  . Number of children: N/A  . Years of education: N/A   Occupational History  . Retired Retired   Social History Main Topics  . Smoking status: Never Smoker  . Smokeless tobacco: Never Used  . Alcohol use No  . Drug use: No  . Sexual activity: Yes    Birth control/ protection: Surgical   Other Topics Concern  . Not on file   Social History Narrative  . No narrative on file    Current Outpatient Prescriptions on File Prior to Visit  Medication Sig Dispense Refill  . ACCU-CHEK AVIVA PLUS test strip     . albuterol (PROVENTIL HFA;VENTOLIN HFA) 108 (90 BASE) MCG/ACT inhaler Inhale 2 puffs into the lungs every 4 (four) hours as needed.      Marland Kitchen aspirin 81 MG tablet Take 81 mg by mouth daily.    Marland Kitchen atorvastatin (LIPITOR) 40 MG tablet TAKE ONE (1) TABLET EACH DAY 90 tablet 3  . baclofen (LIORESAL) 10 MG tablet Take 10 mg by mouth daily.    . carvedilol (COREG) 3.125 MG tablet Take 1 tablet (3.125 mg total) by mouth 2 (two) times daily. 180 tablet 3  . cycloSPORINE (RESTASIS) 0.05 % ophthalmic emulsion Place 1 drop into both eyes 2 (two) times daily.      Marland Kitchen  estradiol (ESTRACE) 0.5 MG tablet Take 2 tablets (1 mg total) by mouth daily. 90 tablet 3  . Fluticasone Furoate-Vilanterol 100-25 MCG/INH AEPB Inhale 1 puff into the lungs daily.    . furosemide (LASIX) 20 MG tablet May take Lasix 20 mg as NEEDED for leg swelling 45 tablet 3  . Hydrocodone-Acetaminophen 2.5-325 MG TABS Take 5-325 mg by mouth as needed.    . Insulin Pen Needle (NOVOTWIST) 32G X 5 MM MISC Use as directed once a day.   V250.00 100 each 6  . levothyroxine (SYNTHROID, LEVOTHROID) 50 MCG tablet TAKE ONE (1) TABLET EACH DAY 30 tablet 1  . lisinopril (PRINIVIL,ZESTRIL) 5 MG tablet Take 5 mg by mouth every morning.       . Magnesium 100 MG CAPS Take 100 mg by mouth once.    . Multiple Vitamins-Minerals (MULTIVITAMINS THER. W/MINERALS) TABS Take 1 tablet by mouth every morning.      Marland Kitchen NOVOFINE 32G X 6 MM MISC USE ONCE DAILY 100 each 1  . omeprazole (PRILOSEC) 20 MG capsule Take 20 mg by mouth every morning.     . pregabalin (LYRICA) 150 MG capsule Take 150 mg by mouth 2 (two) times daily.     . rizatriptan (MAXALT-MLT) 10 MG disintegrating tablet Take 10 mg by mouth daily as needed. May repeat in 2 hours if needed    . venlafaxine (EFFEXOR-XR) 150 MG 24 hr capsule Take 150 mg by mouth every morning.     Marland Kitchen VICTOZA 18 MG/3ML SOPN INJECT 1.8MG  SUBCUTANEOUSLY DAILY 9 mL 2   No current facility-administered medications on file prior to visit.     Allergies  Allergen Reactions  . Betadine [Povidone Iodine] Hives  . Penicillins Rash    Family History  Problem Relation Age of Onset  . Cancer Neg Hx   . Anesthesia problems Neg Hx   . Hypotension Neg Hx   . Malignant hyperthermia Neg Hx   . Pseudochol deficiency Neg Hx   . Heart disease Mother   . Asthma Mother   . COPD Father   . Heart disease Father   . Asthma Daughter   . Migraines Daughter   . Neuropathy Son     BP 103/63 (BP Location: Left Arm, Patient Position: Sitting, Cuff Size: Normal)   Pulse 88   Temp 97.7 F (36.5 C) (Oral)   Resp 16   Ht 5\' 6"  (1.676 m)   Wt 176 lb (79.8 kg)   SpO2 98%   BMI 28.41 kg/m   Review of Systems She denies hypoglycemia    Objective:   Physical Exam  VITAL SIGNS:  See vs page GENERAL: no distress Pulses: dorsalis pedis intact bilat.   MSK: no deformity of the feet CV: no leg edema, but there are bilat varicosities of the feet.   Skin:  no ulcer on the feet.  normal color and temp on the feet. Neuro: sensation is intact to touch on the feet.  Lab Results  Component Value Date   HGBA1C 7.3 08/10/2016      Assessment & Plan:  Type 2 DM, slightly worse: she needs increased rx, if it can be  done with a regimen that avoids or minimizes hypoglycemia.   Edema: this limits oral dx rx options.

## 2016-08-10 NOTE — Telephone Encounter (Signed)
Pharmacy called and is needing verification on the Metformin prescription, requests call back.

## 2016-08-12 NOTE — Telephone Encounter (Signed)
I contacted [employer] and gave clarification on the Metformin ER 24 hr tab. No further questions at this time.

## 2016-08-26 DIAGNOSIS — B029 Zoster without complications: Secondary | ICD-10-CM | POA: Diagnosis not present

## 2016-09-02 ENCOUNTER — Other Ambulatory Visit: Payer: Self-pay | Admitting: *Deleted

## 2016-09-02 MED ORDER — ATORVASTATIN CALCIUM 40 MG PO TABS: ORAL_TABLET | ORAL | 0 refills | Status: DC

## 2016-09-14 ENCOUNTER — Other Ambulatory Visit: Payer: Self-pay | Admitting: Endocrinology

## 2016-09-20 ENCOUNTER — Ambulatory Visit (INDEPENDENT_AMBULATORY_CARE_PROVIDER_SITE_OTHER): Payer: Medicare Other | Admitting: Cardiology

## 2016-09-20 ENCOUNTER — Encounter: Payer: Self-pay | Admitting: Cardiology

## 2016-09-20 VITALS — BP 114/76 | HR 88 | Ht 66.0 in | Wt 176.0 lb

## 2016-09-20 DIAGNOSIS — E7800 Pure hypercholesterolemia, unspecified: Secondary | ICD-10-CM | POA: Diagnosis not present

## 2016-09-20 DIAGNOSIS — I10 Essential (primary) hypertension: Secondary | ICD-10-CM | POA: Diagnosis not present

## 2016-09-20 DIAGNOSIS — D509 Iron deficiency anemia, unspecified: Secondary | ICD-10-CM

## 2016-09-20 DIAGNOSIS — R600 Localized edema: Secondary | ICD-10-CM | POA: Diagnosis not present

## 2016-09-20 DIAGNOSIS — R002 Palpitations: Secondary | ICD-10-CM

## 2016-09-20 NOTE — Patient Instructions (Signed)
Your physician wants you to follow-up in: 1 Year with Dr. Harl Bowie. You will receive a reminder letter in the mail two months in advance. If you don't receive a letter, please call our office to schedule the follow-up appointment.  Your physician recommends that you continue on your current medications as directed. Please refer to the Current Medication list given to you today.  Your physician recommends that you return for lab work in: Fasting  If you need a refill on your cardiac medications before your next appointment, please call your pharmacy.  Thank you for choosing [facility]!

## 2016-09-20 NOTE — Progress Notes (Signed)
Clinical Summary Ms. Hunsaker is a 75 y.o.female seen today for follow up of the following medical problems.   1. Palpitations - compliant with meds - no recent palpitations   2. HTN - compliant with meds - checks bp daily, typically around 110s/60s  3. Hyperlipidemia - compliant with statin  4. LE edema - seen by vascular, evidence of bilateral venous reflux - 06/2015 [place] no DVT - echo 06/2014 LVEF 60-65%, grade II diastolic dysfunction. Has some LE edema but no SOB/DOE, no orthopnea or PND. Has lasix to take prn  - no recent issues. Controlled with compression stockings and prn lasix. Has not had to take lasix often.    Past Medical History:  Diagnosis Date  . Asthma   . ASYMPTOMATIC POSTMENOPAUSAL STATUS 02/09/2009  . Constipation 03/19/2013  . DIABETES MELLITUS, TYPE II 09/14/2007  . HYPERCHOLESTEROLEMIA 02/09/2009  . HYPERTENSION 02/09/2009  . HYPOTHYROIDISM 09/14/2007  . MIGRAINE HEADACHE 09/14/2007  . Neuropathy ([place])   . OSTEOPOROSIS 02/09/2009  . PANCREATITIS 09/14/2007  . 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 PONV (postoperative nausea and vomiting)   . Rectocele 03/19/2013  . Shortness of breath   . SUPERFICIAL PHLEBITIS 09/14/2007  . Varicose veins      Allergies  Allergen Reactions  . Betadine [Povidone Iodine] Hives  . Penicillins Rash     Current Outpatient Prescriptions  Medication Sig Dispense Refill  . ACCU-CHEK AVIVA PLUS test strip     . albuterol (PROVENTIL HFA;VENTOLIN HFA) 108 (90 BASE) MCG/ACT inhaler Inhale 2 puffs into the lungs every 4 (four) hours as needed.      Marland Kitchen aspirin 81 MG tablet Take 81 mg by mouth daily.    Marland Kitchen atorvastatin (LIPITOR) 40 MG tablet TAKE ONE (1) TABLET BY MOUTH EACH DAY 30 tablet 0  . baclofen (LIORESAL) 10 MG tablet Take 10 mg by mouth daily.    . carvedilol (COREG) 3.125 MG tablet Take 1 tablet (3.125 mg total) by mouth 2 (two) times daily. 180 tablet 3  . cycloSPORINE (RESTASIS) 0.05 % ophthalmic emulsion Place 1 drop  into both eyes 2 (two) times daily.      Marland Kitchen estradiol (ESTRACE) 0.5 MG tablet Take 2 tablets (1 mg total) by mouth daily. 90 tablet 3  . Fluticasone Furoate-Vilanterol 100-25 MCG/INH AEPB Inhale 1 puff into the lungs daily.    . furosemide (LASIX) 20 MG tablet May take Lasix 20 mg as NEEDED for leg swelling 45 tablet 3  . Hydrocodone-Acetaminophen 2.5-325 MG TABS Take 5-325 mg by mouth as needed.    . Insulin Pen Needle (NOVOTWIST) 32G X 5 MM MISC Use as directed once a day.   V250.00 100 each 6  . levothyroxine (SYNTHROID, LEVOTHROID) 50 MCG tablet TAKE ONE (1) TABLET EACH DAY 30 tablet 1  . lisinopril (PRINIVIL,ZESTRIL) 5 MG tablet Take 5 mg by mouth every morning.      . Magnesium 100 MG CAPS Take 100 mg by mouth once.    . metFORMIN (GLUCOPHAGE-XR) 500 MG 24 hr tablet TAKE TWO CAPSULES EACH DAY 60 tablet 3  . Multiple Vitamins-Minerals (MULTIVITAMINS THER. W/MINERALS) TABS Take 1 tablet by mouth every morning.      Marland Kitchen NOVOFINE 32G X 6 MM MISC USE ONCE DAILY 100 each 1  . omeprazole (PRILOSEC) 20 MG capsule Take 20 mg by mouth every morning.     . pregabalin (LYRICA) 150 MG capsule Take 150 mg by mouth 2 (two) times daily.     . repaglinide (  PRANDIN) 1 MG tablet Take 1 tablet (1 mg total) by mouth 3 (three) times daily before meals. 270 tablet 3  . rizatriptan (MAXALT-MLT) 10 MG disintegrating tablet Take 10 mg by mouth daily as needed. May repeat in 2 hours if needed    . venlafaxine (EFFEXOR-XR) 150 MG 24 hr capsule Take 150 mg by mouth every morning.     Marland Kitchen VICTOZA 18 MG/3ML SOPN INJECT 1.8MG  SUBCUTANEOUSLY DAILY 9 mL 11   No current facility-administered medications for this visit.      Past Surgical History:  Procedure Laterality Date  . ABDOMINAL HYSTERECTOMY    . ANTERIOR AND POSTERIOR REPAIR N/A 04/02/2013   Procedure: ANTERIOR (CYSTOCELE) AND POSTERIOR REPAIR (RECTOCELE);  Surgeon: Jonnie Kind, MD;  Location: [facility];  Service: Gynecology;  Laterality: N/A;  . CATARACT  EXTRACTION W/PHACO  12/05/2011   Procedure: CATARACT EXTRACTION PHACO AND INTRAOCULAR LENS PLACEMENT (IOC);  Surgeon: Tonny Shaka Cardin;  Location: [facility];  Service: Ophthalmology;  Laterality: Left;  CDE:9.65  . CHOLECYSTECTOMY    . CYSTOSCOPY    . ENDOVENOUS ABLATION SAPHENOUS VEIN W/ LASER  11-03-2011   right greater saphenous vein   left leg done 10-2011  . EYE SURGERY  98   right cataract extraction 98  . LAPAROSCOPIC NISSEN FUNDOPLICATION    . NM ESOPHAGEAL REFLUX  08-11-11  . PITUITARY EXCISION  10/1997  . POSTERIOR REPAIR    . TRANSPHENOIDAL / TRANSNASAL HYPOPHYSECTOMY / RESECTION PITUITARY TUMOR  08-11-11     Allergies  Allergen Reactions  . Betadine [Povidone Iodine] Hives  . Penicillins Rash      Family History  Problem Relation Age of Onset  . Cancer Neg Hx   . Anesthesia problems Neg Hx   . Hypotension Neg Hx   . Malignant hyperthermia Neg Hx   . Pseudochol deficiency Neg Hx   . Heart disease Mother   . Asthma Mother   . COPD Father   . Heart disease Father   . Asthma Daughter   . Migraines Daughter   . Neuropathy Son      Social History Ms. Franchi reports that she has never smoked. She has never used smokeless tobacco. Ms. Labranche reports that she does not drink alcohol.   Review of Systems CONSTITUTIONAL: No weight loss, fever, chills, weakness or fatigue.  HEENT: Eyes: No visual loss, blurred vision, double vision or yellow sclerae.No hearing loss, sneezing, congestion, runny nose or sore throat.  SKIN: No rash or itching.  CARDIOVASCULAR: per HPI RESPIRATORY: No shortness of breath, cough or sputum.  GASTROINTESTINAL: No anorexia, nausea, vomiting or diarrhea. No abdominal pain or blood.  GENITOURINARY: No burning on urination, no polyuria NEUROLOGICAL: No headache, dizziness, syncope, paralysis, ataxia, numbness or tingling in the extremities. No change in bowel or bladder control.  MUSCULOSKELETAL: No muscle, back pain, joint pain or stiffness.    LYMPHATICS: No enlarged nodes. No history of splenectomy.  PSYCHIATRIC: No history of depression or anxiety.  ENDOCRINOLOGIC: No reports of sweating, cold or heat intolerance. No polyuria or polydipsia.  Marland Kitchen   Physical Examination Vitals:   09/20/16 0953  BP: 114/76  Pulse: 88   Vitals:   09/20/16 0953  Weight: 176 lb (79.8 kg)  Height: 5\' 6"  (1.676 m)    Gen: resting comfortably, no acute distress HEENT: no scleral icterus, pupils equal round and reactive, no palptable cervical adenopathy,  CV: RRR, no m/r/g, no jvd Resp: Clear to auscultation bilaterally GI: abdomen is soft, non-tender, non-distended, normal  bowel sounds, no hepatosplenomegaly MSK: extremities are warm, no edema.  Skin: warm, no rash Neuro:  no focal deficits Psych: appropriate affect   Diagnostic Studies 06/2014 Echo Study Conclusions  - Left ventricle: The cavity size was normal. Wall thickness was increased in a pattern of mild LVH. Systolic function was normal. The estimated ejection fraction was in the range of 60% to 65%. Wall motion was normal; there were no regional wall motion abnormalities. Features are consistent with a pseudonormal left ventricular filling pattern, with concomitant abnormal relaxation and increased filling pressure (grade 2 diastolic dysfunction). - Aortic valve: There was trivial regurgitation. - Mitral valve: Calcified annulus. There was mild regurgitation. - Left atrium: The atrium was moderately dilated. - Right atrium: Central venous pressure (est): 3 mm Hg. - Tricuspid valve: There was trivial regurgitation. - Pulmonary arteries: Systolic pressure could not be accurately estimated. - Pericardium, extracardiac: There was no pericardial effusion.  Impressions:  - Mild LVH with LVEF 123456, grade 2 diastolic dysfunction. Moderate left atrial enlargement. MAC with mild mitral regurgitation. Unable to assess PASP.    Assessment and Plan  1. Palpitations - no  recent symptoms - we will continue beta blocker  2. HTN -she is  at goal, she will continue current meds.   3. Hyperlipidemia - request most recent labs from pcp - continue statin  4. LE edema -  continue prn lasix and compression stockings. Well controlled at this time.    F/u 1 year. Repeat annual labs.    Arnoldo Lenis, M.D.

## 2016-09-21 ENCOUNTER — Other Ambulatory Visit: Payer: Self-pay | Admitting: Endocrinology

## 2016-09-28 ENCOUNTER — Other Ambulatory Visit: Payer: Self-pay | Admitting: Cardiology

## 2016-10-03 LAB — CBC WITH DIFFERENTIAL/PLATELET
BASOS ABS: 95 {cells}/uL (ref 0–200)
Basophils Relative: 1 %
EOS ABS: 570 {cells}/uL — AB (ref 15–500)
Eosinophils Relative: 6 %
HCT: 38.5 % (ref 35.0–45.0)
Hemoglobin: 12.3 g/dL (ref 11.7–15.5)
Lymphocytes Relative: 28 %
Lymphs Abs: 2660 cells/uL (ref 850–3900)
MCH: 27.5 pg (ref 27.0–33.0)
MCHC: 31.9 g/dL — ABNORMAL LOW (ref 32.0–36.0)
MCV: 86.1 fL (ref 80.0–100.0)
MONOS PCT: 8 %
MPV: 10.3 fL (ref 7.5–12.5)
Monocytes Absolute: 760 cells/uL (ref 200–950)
NEUTROS ABS: 5415 {cells}/uL (ref 1500–7800)
NEUTROS PCT: 57 %
PLATELETS: 286 10*3/uL (ref 140–400)
RBC: 4.47 MIL/uL (ref 3.80–5.10)
RDW: 16.3 % — AB (ref 11.0–15.0)
WBC: 9.5 10*3/uL (ref 3.8–10.8)

## 2016-10-03 LAB — LIPID PANEL
CHOL/HDL RATIO: 3 ratio (ref <=5.0)
Cholesterol: 110 mg/dL — ABNORMAL LOW (ref 125–200)
HDL: 37 mg/dL — AB (ref >=46)
LDL Cholesterol: 49 mg/dL (ref <130)
TRIGLYCERIDES: 122 mg/dL (ref <150)
VLDL: 24 mg/dL (ref <30)

## 2016-10-03 LAB — COMPREHENSIVE METABOLIC PANEL
ALT: 17 U/L (ref 6–29)
AST: 18 U/L (ref 10–35)
Albumin: 3.4 g/dL — ABNORMAL LOW (ref 3.6–5.1)
Alkaline Phosphatase: 37 U/L (ref 33–130)
BUN: 12 mg/dL (ref 7–25)
CALCIUM: 8.6 mg/dL (ref 8.6–10.4)
CHLORIDE: 105 mmol/L (ref 98–110)
CO2: 25 mmol/L (ref 20–31)
Creat: 0.72 mg/dL (ref 0.60–0.93)
Glucose, Bld: 158 mg/dL — ABNORMAL HIGH (ref 65–99)
POTASSIUM: 4.3 mmol/L (ref 3.5–5.3)
Sodium: 141 mmol/L (ref 135–146)
TOTAL PROTEIN: 6.1 g/dL (ref 6.1–8.1)
Total Bilirubin: 0.5 mg/dL (ref 0.2–1.2)

## 2016-10-03 LAB — MAGNESIUM: MAGNESIUM: 1.5 mg/dL (ref 1.5–2.5)

## 2016-12-07 ENCOUNTER — Other Ambulatory Visit: Payer: Self-pay | Admitting: Obstetrics and Gynecology

## 2016-12-07 DIAGNOSIS — Z1231 Encounter for screening mammogram for malignant neoplasm of breast: Secondary | ICD-10-CM

## 2016-12-07 NOTE — Telephone Encounter (Signed)
Refilled estradiol at 0.5 mg/day. Pt will let us know if lower dose ineffective.

## 2016-12-09 ENCOUNTER — Ambulatory Visit: Payer: Medicare Other | Admitting: Endocrinology

## 2016-12-13 ENCOUNTER — Other Ambulatory Visit: Payer: Self-pay | Admitting: Endocrinology

## 2016-12-14 ENCOUNTER — Other Ambulatory Visit: Payer: Self-pay | Admitting: Endocrinology

## 2016-12-14 ENCOUNTER — Ambulatory Visit (HOSPITAL_COMMUNITY)
Admission: RE | Admit: 2016-12-14 | Discharge: 2016-12-14 | Disposition: A | Discharge status: Home / Self Care | Payer: Medicare Other | Source: Ambulatory Visit | Attending: Obstetrics and Gynecology | Admitting: Obstetrics and Gynecology

## 2016-12-14 ENCOUNTER — Ambulatory Visit (HOSPITAL_COMMUNITY)
Admission: RE | Admit: 2016-12-14 | Discharge: 2016-12-14 | Disposition: A | Discharge status: Home / Self Care | Payer: Medicare Other | Source: Ambulatory Visit | Attending: Pulmonary Disease | Admitting: Pulmonary Disease

## 2016-12-14 ENCOUNTER — Other Ambulatory Visit (HOSPITAL_COMMUNITY): Payer: Self-pay | Admitting: Pulmonary Disease

## 2016-12-14 DIAGNOSIS — Z1231 Encounter for screening mammogram for malignant neoplasm of breast: Secondary | ICD-10-CM | POA: Diagnosis not present

## 2016-12-14 DIAGNOSIS — R6884 Jaw pain: Secondary | ICD-10-CM | POA: Insufficient documentation

## 2016-12-14 DIAGNOSIS — S0303XA Dislocation of jaw, bilateral, initial encounter: Secondary | ICD-10-CM | POA: Diagnosis not present

## 2016-12-21 ENCOUNTER — Other Ambulatory Visit: Payer: Self-pay | Admitting: Endocrinology

## 2016-12-21 LAB — IFOBT (OCCULT BLOOD): IFOBT: NEGATIVE

## 2017-01-01 NOTE — Progress Notes (Signed)
Subjective:    Patient ID: [name] White, female    DOB: 1941/04/10, 76 y.o.   MRN: [id number]  HPI Pt returns for f/u of diabetes mellitus: DM type: 2 Dx'ed: 1999, after an episode of pancreatitis (no pancreatitis since then).  Complications: none Therapy: 2 oral meds + victoza.  GDM: never DKA: never Severe hypoglycemia: never.    Other: she has never taken insulin; she cannot take bromocriptine, due to an interaction with maxalt; pioglitizone has caused edema.   Interval history: no cbg record, but states cbg's are in the mid-100's.  pt states she feels well in general.   She occasionally has mild hypoglycemia after breakfast, if she is active.   Past Medical History:  Diagnosis Date  . Asthma   . ASYMPTOMATIC POSTMENOPAUSAL STATUS 02/09/2009  . Constipation 03/19/2013  . DIABETES MELLITUS, TYPE II 09/14/2007  . HYPERCHOLESTEROLEMIA 02/09/2009  . HYPERTENSION 02/09/2009  . HYPOTHYROIDISM 09/14/2007  . MIGRAINE HEADACHE 09/14/2007  . Neuropathy ([place])   . OSTEOPOROSIS 02/09/2009  . PANCREATITIS 09/14/2007  . 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 PONV (postoperative nausea and vomiting)   . Rectocele 03/19/2013  . Shortness of breath   . SUPERFICIAL PHLEBITIS 09/14/2007  . Varicose veins     Past Surgical History:  Procedure Laterality Date  . ABDOMINAL HYSTERECTOMY    . ANTERIOR AND POSTERIOR REPAIR N/A 04/02/2013   Procedure: ANTERIOR (CYSTOCELE) AND POSTERIOR REPAIR (RECTOCELE);  Surgeon: Jonnie Kind, MD;  Location: [facility];  Service: Gynecology;  Laterality: N/A;  . CATARACT EXTRACTION W/PHACO  12/05/2011   Procedure: CATARACT EXTRACTION PHACO AND INTRAOCULAR LENS PLACEMENT (IOC);  Surgeon: Tonny Branch;  Location: [facility];  Service: Ophthalmology;  Laterality: Left;  CDE:9.65  . CHOLECYSTECTOMY    . CYSTOSCOPY    . ENDOVENOUS ABLATION SAPHENOUS VEIN W/ LASER  11-03-2011   right greater saphenous vein   left leg done 10-2011  . EYE SURGERY  98   right cataract extraction 98  .  LAPAROSCOPIC NISSEN FUNDOPLICATION    . NM ESOPHAGEAL REFLUX  08-11-11  . PITUITARY EXCISION  10/1997  . POSTERIOR REPAIR    . TRANSPHENOIDAL / TRANSNASAL HYPOPHYSECTOMY / RESECTION PITUITARY TUMOR  08-11-11    Social History   Social History  . Marital status: Married    Spouse name: N/A  . Number of children: N/A  . Years of education: N/A   Occupational History  . Retired Retired   Social History Main Topics  . Smoking status: Never Smoker  . Smokeless tobacco: Never Used  . Alcohol use No  . Drug use: No  . Sexual activity: Yes    Birth control/ protection: Surgical   Other Topics Concern  . Not on file   Social History Narrative  . No narrative on file    Current Outpatient Prescriptions on File Prior to Visit  Medication Sig Dispense Refill  . ACCU-CHEK AVIVA PLUS test strip     . albuterol (PROVENTIL HFA;VENTOLIN HFA) 108 (90 BASE) MCG/ACT inhaler Inhale 2 puffs into the lungs every 4 (four) hours as needed.      Marland Kitchen aspirin 81 MG tablet Take 81 mg by mouth daily.    Marland Kitchen atorvastatin (LIPITOR) 40 MG tablet TAKE ONE (1) TABLET BY MOUTH EVERY DAY 30 tablet 6  . baclofen (LIORESAL) 10 MG tablet Take 10 mg by mouth daily.    . carvedilol (COREG) 3.125 MG tablet Take 1 tablet (3.125 mg total) by mouth 2 (two) times daily. 180 tablet  3  . cycloSPORINE (RESTASIS) 0.05 % ophthalmic emulsion Place 1 drop into both eyes 2 (two) times daily.      Marland Kitchen estradiol (ESTRACE) 0.5 MG tablet Take 1 tablet (0.5 mg total) by mouth daily. 90 tablet 3  . furosemide (LASIX) 20 MG tablet May take Lasix 20 mg as NEEDED for leg swelling 45 tablet 3  . Hydrocodone-Acetaminophen 2.5-325 MG TABS Take 5-325 mg by mouth as needed.    . Insulin Pen Needle (NOVOTWIST) 32G X 5 MM MISC Use as directed once a day.   V250.00 100 each 6  . levothyroxine (SYNTHROID, LEVOTHROID) 50 MCG tablet TAKE ONE (1) TABLET EACH DAY 30 tablet 2  . lisinopril (PRINIVIL,ZESTRIL) 5 MG tablet Take 5 mg by mouth every  morning.      . Magnesium 100 MG CAPS Take 100 mg by mouth once.    . metFORMIN (GLUCOPHAGE-XR) 500 MG 24 hr tablet TAKE TWO TABLETS BY MOUTH DAILY 60 tablet 2  . Multiple Vitamins-Minerals (MULTIVITAMINS THER. W/MINERALS) TABS Take 1 tablet by mouth every morning.      Marland Kitchen NOVOFINE 32G X 6 MM MISC USE ONCE DAILY 100 each 1  . omeprazole (PRILOSEC) 20 MG capsule Take 20 mg by mouth every morning.     . pregabalin (LYRICA) 150 MG capsule Take 150 mg by mouth 2 (two) times daily.     . rizatriptan (MAXALT-MLT) 10 MG disintegrating tablet Take 10 mg by mouth daily as needed. May repeat in 2 hours if needed    . venlafaxine (EFFEXOR-XR) 150 MG 24 hr capsule Take 150 mg by mouth every morning.     Marland Kitchen VICTOZA 18 MG/3ML SOPN INJECT 1.8MG  SUBCUTANEOUSLY DAILY 9 mL 11  . Fluticasone Furoate-Vilanterol 100-25 MCG/INH AEPB Inhale 1 puff into the lungs daily.     No current facility-administered medications on file prior to visit.     Allergies  Allergen Reactions  . Betadine [Povidone Iodine] Hives  . Penicillins Rash    Family History  Problem Relation Age of Onset  . Heart disease Mother   . Asthma Mother   . COPD Father   . Heart disease Father   . Asthma Daughter   . Migraines Daughter   . Neuropathy Son   . Cancer Neg Hx   . Anesthesia problems Neg Hx   . Hypotension Neg Hx   . Malignant hyperthermia Neg Hx   . Pseudochol deficiency Neg Hx     BP 122/70   Pulse 94   Ht 5\' 6"  (1.676 m)   Wt 170 lb (77.1 kg)   SpO2 95%   BMI 27.44 kg/m    Review of Systems She denies LOC    Objective:   Physical Exam VITAL SIGNS:  See vs page.   GENERAL: no distress.  Pulses: dorsalis pedis intact bilat.   MSK: no deformity of the feet.  CV: no leg edema, but there are bilat varicosities of the feet.   Skin:  no ulcer on the feet.  normal color and temp on the feet.  Neuro: sensation is intact to touch on the feet.    A1c=7.6%    Assessment & Plan:  Insulin-requiring type 2 DM:  Based on the pattern of her cbg's, she needs some adjustment in her therapy.   Patient is advised the following: Patient Instructions  check your blood sugar once a day.  vary the time of day when you check, between before the 3 meals, and at bedtime.  also  check if you have symptoms of your blood sugar being too high or too low.  please keep a record of the readings and bring it to your next appointment here (or you can bring the meter itself).  You can write it on any piece of paper.  please call us sooner if your blood sugar goes below 70, or if you have a lot of readings over 200. Please change repaglinde, as below.   Please come White for a follow-up appointment in 4 months.

## 2017-01-03 ENCOUNTER — Ambulatory Visit (INDEPENDENT_AMBULATORY_CARE_PROVIDER_SITE_OTHER): Payer: Medicare Other | Admitting: Endocrinology

## 2017-01-03 ENCOUNTER — Encounter: Payer: Self-pay | Admitting: Endocrinology

## 2017-01-03 VITALS — BP 122/70 | HR 94 | Ht 66.0 in | Wt 170.0 lb

## 2017-01-03 DIAGNOSIS — E119 Type 2 diabetes mellitus without complications: Secondary | ICD-10-CM

## 2017-01-03 LAB — POCT GLYCOSYLATED HEMOGLOBIN (HGB A1C): Hemoglobin A1C: 7.6

## 2017-01-03 MED ORDER — REPAGLINIDE 0.5 MG PO TABS: ORAL_TABLET | ORAL | 3 refills | Status: DC

## 2017-01-03 NOTE — Patient Instructions (Addendum)
check your blood sugar once a day.  vary the time of day when you check, between before the 3 meals, and at bedtime.  also check if you have symptoms of your blood sugar being too high or too low.  please keep a record of the readings and bring it to your next appointment here (or you can bring the meter itself).  You can write it on any piece of paper.  please call us sooner if your blood sugar goes below 70, or if you have a lot of readings over 200. Please change repaglinde, as below.   Please come back for a follow-up appointment in 4 months.

## 2017-03-08 ENCOUNTER — Other Ambulatory Visit: Payer: Self-pay | Admitting: Endocrinology

## 2017-03-22 ENCOUNTER — Other Ambulatory Visit: Payer: Self-pay | Admitting: Endocrinology

## 2017-04-26 ENCOUNTER — Other Ambulatory Visit: Payer: Self-pay | Admitting: Cardiology

## 2017-05-03 ENCOUNTER — Telehealth: Payer: Self-pay | Admitting: Endocrinology

## 2017-05-03 ENCOUNTER — Encounter: Payer: Self-pay | Admitting: Endocrinology

## 2017-05-03 ENCOUNTER — Ambulatory Visit (INDEPENDENT_AMBULATORY_CARE_PROVIDER_SITE_OTHER): Payer: Medicare Other | Admitting: Endocrinology

## 2017-05-03 VITALS — BP 118/66 | HR 94 | Ht 66.0 in | Wt 170.0 lb

## 2017-05-03 DIAGNOSIS — E119 Type 2 diabetes mellitus without complications: Secondary | ICD-10-CM | POA: Diagnosis not present

## 2017-05-03 LAB — POCT GLYCOSYLATED HEMOGLOBIN (HGB A1C): HEMOGLOBIN A1C: 8

## 2017-05-03 MED ORDER — CANAGLIFLOZIN 100 MG PO TABS: 50.0000 mg | ORAL_TABLET | Freq: Every day | ORAL | 11 refills | Status: DC

## 2017-05-03 NOTE — Telephone Encounter (Signed)
ok 

## 2017-05-03 NOTE — Progress Notes (Signed)
Subjective:    Patient ID: [name] White, female    DOB: July 05, 1941, 76 y.o.   MRN: [id number]  HPI Pt returns for f/u of diabetes mellitus: DM type: 2 Dx'ed: 1999, after an episode of pancreatitis (no pancreatitis since then).  Complications: none Therapy: 2 oral meds + victoza.  GDM: never DKA: never Severe hypoglycemia: never.    Other: she has never taken insulin; she cannot take bromocriptine, due to an interaction with maxalt; pioglitizone has caused edema.   Interval history: no cbg record, but states cbg's are in the mid-100's.  pt states she feels well in general.   Past Medical History:  Diagnosis Date  . Asthma   . ASYMPTOMATIC POSTMENOPAUSAL STATUS 02/09/2009  . Constipation 03/19/2013  . DIABETES MELLITUS, TYPE II 09/14/2007  . HYPERCHOLESTEROLEMIA 02/09/2009  . HYPERTENSION 02/09/2009  . HYPOTHYROIDISM 09/14/2007  . MIGRAINE HEADACHE 09/14/2007  . Neuropathy   . OSTEOPOROSIS 02/09/2009  . PANCREATITIS 09/14/2007  . 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 PONV (postoperative nausea and vomiting)   . Rectocele 03/19/2013  . Shortness of breath   . SUPERFICIAL PHLEBITIS 09/14/2007  . Varicose veins     Past Surgical History:  Procedure Laterality Date  . ABDOMINAL HYSTERECTOMY    . ANTERIOR AND POSTERIOR REPAIR N/A 04/02/2013   Procedure: ANTERIOR (CYSTOCELE) AND POSTERIOR REPAIR (RECTOCELE);  Surgeon: Jonnie Kind, MD;  Location: [facility];  Service: Gynecology;  Laterality: N/A;  . CATARACT EXTRACTION W/PHACO  12/05/2011   Procedure: CATARACT EXTRACTION PHACO AND INTRAOCULAR LENS PLACEMENT (IOC);  Surgeon: Tonny Branch;  Location: [facility];  Service: Ophthalmology;  Laterality: Left;  CDE:9.65  . CHOLECYSTECTOMY    . CYSTOSCOPY    . ENDOVENOUS ABLATION SAPHENOUS VEIN W/ LASER  11-03-2011   right greater saphenous vein   left leg done 10-2011  . EYE SURGERY  98   right cataract extraction 98  . LAPAROSCOPIC NISSEN FUNDOPLICATION    . NM ESOPHAGEAL REFLUX  08-11-11  . PITUITARY  EXCISION  10/1997  . POSTERIOR REPAIR    . TRANSPHENOIDAL / TRANSNASAL HYPOPHYSECTOMY / RESECTION PITUITARY TUMOR  08-11-11    Social History   Social History  . Marital status: Married    Spouse name: N/A  . Number of children: N/A  . Years of education: N/A   Occupational History  . Retired Retired   Social History Main Topics  . Smoking status: Never Smoker  . Smokeless tobacco: Never Used  . Alcohol use No  . Drug use: No  . Sexual activity: Yes    Birth control/ protection: Surgical   Other Topics Concern  . Not on file   Social History Narrative  . No narrative on file    Current Outpatient Prescriptions on File Prior to Visit  Medication Sig Dispense Refill  . ACCU-CHEK AVIVA PLUS test strip     . albuterol (PROVENTIL HFA;VENTOLIN HFA) 108 (90 BASE) MCG/ACT inhaler Inhale 2 puffs into the lungs every 4 (four) hours as needed.      Marland Kitchen aspirin 81 MG tablet Take 81 mg by mouth daily.    Marland Kitchen atorvastatin (LIPITOR) 40 MG tablet TAKE ONE (1) TABLET EACH DAY 30 tablet 3  . baclofen (LIORESAL) 10 MG tablet Take 10 mg by mouth daily.    . carvedilol (COREG) 3.125 MG tablet Take 1 tablet (3.125 mg total) by mouth 2 (two) times daily. 180 tablet 3  . cycloSPORINE (RESTASIS) 0.05 % ophthalmic emulsion Place 1 drop into both eyes 2 (  two) times daily.      Marland Kitchen estradiol (ESTRACE) 0.5 MG tablet Take 1 tablet (0.5 mg total) by mouth daily. 90 tablet 3  . Fluticasone Furoate-Vilanterol 100-25 MCG/INH AEPB Inhale 1 puff into the lungs daily.    . furosemide (LASIX) 20 MG tablet May take Lasix 20 mg as NEEDED for leg swelling 45 tablet 3  . Hydrocodone-Acetaminophen 2.5-325 MG TABS Take 5-325 mg by mouth as needed.    . Insulin Pen Needle (NOVOTWIST) 32G X 5 MM MISC Use as directed once a day.   V250.00 100 each 6  . levothyroxine (SYNTHROID, LEVOTHROID) 50 MCG tablet TAKE ONE (1) TABLET EACH DAY 30 tablet 2  . lisinopril (PRINIVIL,ZESTRIL) 5 MG tablet Take 5 mg by mouth every morning.       . metFORMIN (GLUCOPHAGE-XR) 500 MG 24 hr tablet TAKE TWO (2) TABLETS BY MOUTH DAILY 60 tablet 2  . Multiple Vitamins-Minerals (MULTIVITAMINS THER. W/MINERALS) TABS Take 1 tablet by mouth every morning.      Marland Kitchen NOVOFINE 32G X 6 MM MISC USE ONCE DAILY 100 each 1  . omeprazole (PRILOSEC) 20 MG capsule Take 20 mg by mouth every morning.     . pregabalin (LYRICA) 150 MG capsule Take 150 mg by mouth 2 (two) times daily.     . repaglinide (PRANDIN) 0.5 MG tablet 1 tab with breakfast, 4 with lunch, and 4 with supper 810 tablet 3  . rizatriptan (MAXALT-MLT) 10 MG disintegrating tablet Take 10 mg by mouth daily as needed. May repeat in 2 hours if needed    . venlafaxine (EFFEXOR-XR) 150 MG 24 hr capsule Take 150 mg by mouth every morning.     Marland Kitchen VICTOZA 18 MG/3ML SOPN INJECT 1.8MG  SUBCUTANEOUSLY DAILY 9 mL 11  . Magnesium 100 MG CAPS Take 100 mg by mouth once.     No current facility-administered medications on file prior to visit.     Allergies  Allergen Reactions  . Betadine [Povidone Iodine] Hives  . Penicillins Rash    Family History  Problem Relation Age of Onset  . Heart disease Mother   . Asthma Mother   . COPD Father   . Heart disease Father   . Asthma Daughter   . Migraines Daughter   . Neuropathy Son   . Cancer Neg Hx   . Anesthesia problems Neg Hx   . Hypotension Neg Hx   . Malignant hyperthermia Neg Hx   . Pseudochol deficiency Neg Hx     BP 118/66   Pulse 94   Ht 5\' 6"  (1.676 m)   Wt 170 lb (77.1 kg)   SpO2 95%   BMI 27.44 kg/m    Review of Systems She denies hypoglycemia    Objective:   Physical Exam VITAL SIGNS:  See vs page.   GENERAL: no distress.  Pulses: dorsalis pedis intact bilat.   MSK: no deformity of the feet.  CV: 1+ bilat leg edema, and bilat varicosities of the feet.   Skin:  no ulcer on the feet.  normal color and temp on the feet.  Neuro: sensation is intact to touch on the feet.    a1c=8.0%  Lab Results  Component Value Date    CREATININE 0.72 10/03/2016   BUN 12 10/03/2016   NA 141 10/03/2016   K 4.3 10/03/2016   CL 105 10/03/2016   CO2 25 10/03/2016      Assessment & Plan:  Type 2 DM: worse.  Edema, persistent. This limits rx  options.   Patient Instructions  check your blood sugar once a day.  vary the time of day when you check, between before the 3 meals, and at bedtime.  also check if you have symptoms of your blood sugar being too high or too low.  please keep a record of the readings and bring it to your next appointment here (or you can bring the meter itself).  You can write it on any piece of paper.  please call us sooner if your blood sugar goes below 70, or if you have a lot of readings over 200. I have sent a prescription to your pharmacy, to add "invokana." Please come White for a follow-up appointment in 4 months.

## 2017-05-03 NOTE — Patient Instructions (Addendum)
check your blood sugar once a day.  vary the time of day when you check, between before the 3 meals, and at bedtime.  also check if you have symptoms of your blood sugar being too high or too low.  please keep a record of the readings and bring it to your next appointment here (or you can bring the meter itself).  You can write it on any piece of paper.  please call us sooner if your blood sugar goes below 70, or if you have a lot of readings over 200. I have sent a prescription to your pharmacy, to add "invokana." Please come back for a follow-up appointment in 4 months.

## 2017-05-03 NOTE — Telephone Encounter (Signed)
Pt requests that the Invokana please be resubmitted to the [facility].

## 2017-05-04 ENCOUNTER — Other Ambulatory Visit: Payer: Self-pay

## 2017-05-04 NOTE — Telephone Encounter (Signed)
Patient stated pharmacy haven't received her medication.

## 2017-05-05 ENCOUNTER — Other Ambulatory Visit: Payer: Self-pay

## 2017-05-05 MED ORDER — CANAGLIFLOZIN 100 MG PO TABS: 50.0000 mg | ORAL_TABLET | Freq: Every day | ORAL | 11 refills | Status: DC

## 2017-05-05 NOTE — Telephone Encounter (Signed)
This has been resolved Invokana sent to [facility]

## 2017-05-29 DIAGNOSIS — J45909 Unspecified asthma, uncomplicated: Secondary | ICD-10-CM | POA: Diagnosis not present

## 2017-05-29 DIAGNOSIS — I10 Essential (primary) hypertension: Secondary | ICD-10-CM | POA: Diagnosis not present

## 2017-05-29 DIAGNOSIS — K210 Gastro-esophageal reflux disease with esophagitis: Secondary | ICD-10-CM | POA: Diagnosis not present

## 2017-05-29 DIAGNOSIS — E119 Type 2 diabetes mellitus without complications: Secondary | ICD-10-CM | POA: Diagnosis not present

## 2017-06-07 ENCOUNTER — Other Ambulatory Visit: Payer: Self-pay | Admitting: Cardiology

## 2017-06-09 ENCOUNTER — Other Ambulatory Visit: Payer: Self-pay | Admitting: Endocrinology

## 2017-06-21 ENCOUNTER — Other Ambulatory Visit: Payer: Self-pay | Admitting: Endocrinology

## 2017-06-29 ENCOUNTER — Telehealth: Payer: Self-pay | Admitting: Endocrinology

## 2017-06-29 NOTE — Telephone Encounter (Signed)
Pharmacy needs to know if the patient is supposed to be taking 1/2 of the canagliflozin (INVOKANA) 100 MG TABS tablet. The medication is not supposed to be broken in half. Call pharmacy to advise as soon as possible.

## 2017-06-30 NOTE — Telephone Encounter (Signed)
See message and please advise, Thanks!  

## 2017-06-30 NOTE — Telephone Encounter (Signed)
Pharmacy notified.

## 2017-06-30 NOTE — Telephone Encounter (Signed)
If it doesn't cut exactly in 1/2, no problem

## 2017-07-06 ENCOUNTER — Telehealth: Payer: Self-pay | Admitting: Endocrinology

## 2017-07-06 NOTE — Telephone Encounter (Signed)
please call patient: We should try another pharmacy.  Which should we send to?

## 2017-07-07 NOTE — Telephone Encounter (Signed)
Patient called to advise that  [facility] [address], [place] [phone] (Phone) [phone] (Fax)   Has been filling the medication however, she would like a call to further discuss first. Call patient to advise.

## 2017-07-07 NOTE — Telephone Encounter (Signed)
I contacted the patient and discussed the the Invokanna. She stated she was able to pick the medication up at [facility] with the directions to take 1/2 tab daily.

## 2017-07-07 NOTE — Telephone Encounter (Addendum)
Requested a call back to determine what other pharmacy we could send the medication to.

## 2017-07-20 ENCOUNTER — Encounter (INDEPENDENT_AMBULATORY_CARE_PROVIDER_SITE_OTHER): Payer: Self-pay

## 2017-07-20 DIAGNOSIS — I868 Varicose veins of other specified sites: Secondary | ICD-10-CM

## 2017-07-25 ENCOUNTER — Telehealth: Payer: Self-pay | Admitting: Endocrinology

## 2017-07-25 NOTE — Telephone Encounter (Signed)
[place], [place] [phone] (Phone) [phone] (Fax)   Called to get a refill sent in for the canagliflozin (INVOKANA) 100 MG TABS tablet however, needs clarification on if the patient should take 1/2 before breakfast or 1 whole daily. Please advise.   All medications go to [facility] now.

## 2017-07-26 NOTE — Telephone Encounter (Signed)
The reason why they don't want you to cut it that they lose money.  It is a solid pill, not liquid-filled or extended-release.  Please continue the same medication.

## 2017-07-26 NOTE — Telephone Encounter (Signed)
Tammy from pharmacy calling back to confirm that instructions on the medication are correct. Medication states that patient takes 1 before breakfast.   Please call pharmacy and advise.

## 2017-07-26 NOTE — Telephone Encounter (Signed)
Called and clarified with pharmacy that the 15 tablets a month is correct.

## 2017-07-27 NOTE — Telephone Encounter (Signed)
Pharmacy called to verify if the patient is to cut the invokana in half or take a whole tablet. I was advised by Almyra Free and Judson Roch that the patient is to take 1/2 a tablet. No further action required.

## 2017-08-24 ENCOUNTER — Other Ambulatory Visit: Payer: Self-pay

## 2017-08-24 MED ORDER — ATORVASTATIN CALCIUM 40 MG PO TABS: ORAL_TABLET | ORAL | 3 refills | Status: DC

## 2017-09-04 ENCOUNTER — Ambulatory Visit (INDEPENDENT_AMBULATORY_CARE_PROVIDER_SITE_OTHER): Payer: Medicare Other | Admitting: Endocrinology

## 2017-09-04 ENCOUNTER — Encounter: Payer: Self-pay | Admitting: Endocrinology

## 2017-09-04 VITALS — BP 118/74 | HR 92 | Wt 169.0 lb

## 2017-09-04 DIAGNOSIS — E119 Type 2 diabetes mellitus without complications: Secondary | ICD-10-CM | POA: Diagnosis not present

## 2017-09-04 DIAGNOSIS — Z23 Encounter for immunization: Secondary | ICD-10-CM

## 2017-09-04 LAB — POCT GLYCOSYLATED HEMOGLOBIN (HGB A1C): HEMOGLOBIN A1C: 7.6

## 2017-09-04 MED ORDER — REPAGLINIDE 1 MG PO TABS: ORAL_TABLET | ORAL | 3 refills | Status: DC

## 2017-09-04 NOTE — Progress Notes (Signed)
Subjective:    Patient ID: [name] White, female    DOB: 08-20-1941, 76 y.o.   MRN: [id number]  HPI Pt returns for f/u of diabetes mellitus: DM type: 2 Dx'ed: 1999, after an episode of pancreatitis (no pancreatitis since then).  Complications: none Therapy: 3 oral meds + victoza.  GDM: never DKA: never Severe hypoglycemia: never.    Other: she has never taken insulin; she cannot take bromocriptine, due to an interaction with maxalt; pioglitizone has caused edema.  Interval history: no cbg record, but states cbg's are in the low to mid-100's.  It is in general higher as the day goes on.  pt states she feels well in general.   Past Medical History:  Diagnosis Date  . Asthma   . ASYMPTOMATIC POSTMENOPAUSAL STATUS 02/09/2009  . Constipation 03/19/2013  . DIABETES MELLITUS, TYPE II 09/14/2007  . HYPERCHOLESTEROLEMIA 02/09/2009  . HYPERTENSION 02/09/2009  . HYPOTHYROIDISM 09/14/2007  . MIGRAINE HEADACHE 09/14/2007  . Neuropathy   . OSTEOPOROSIS 02/09/2009  . PANCREATITIS 09/14/2007  . 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 PONV (postoperative nausea and vomiting)   . Rectocele 03/19/2013  . Shortness of breath   . SUPERFICIAL PHLEBITIS 09/14/2007  . Varicose veins     Past Surgical History:  Procedure Laterality Date  . ABDOMINAL HYSTERECTOMY    . ANTERIOR AND POSTERIOR REPAIR N/A 04/02/2013   Procedure: ANTERIOR (CYSTOCELE) AND POSTERIOR REPAIR (RECTOCELE);  Surgeon: Jonnie Kind, MD;  Location: [facility];  Service: Gynecology;  Laterality: N/A;  . CATARACT EXTRACTION W/PHACO  12/05/2011   Procedure: CATARACT EXTRACTION PHACO AND INTRAOCULAR LENS PLACEMENT (IOC);  Surgeon: Tonny Branch;  Location: [facility];  Service: Ophthalmology;  Laterality: Left;  CDE:9.65  . CHOLECYSTECTOMY    . CYSTOSCOPY    . ENDOVENOUS ABLATION SAPHENOUS VEIN W/ LASER  11-03-2011   right greater saphenous vein   left leg done 10-2011  . EYE SURGERY  98   right cataract extraction 98  . LAPAROSCOPIC NISSEN FUNDOPLICATION     . NM ESOPHAGEAL REFLUX  08-11-11  . PITUITARY EXCISION  10/1997  . POSTERIOR REPAIR    . TRANSPHENOIDAL / TRANSNASAL HYPOPHYSECTOMY / RESECTION PITUITARY TUMOR  08-11-11    Social History   Social History  . Marital status: Married    Spouse name: N/A  . Number of children: N/A  . Years of education: N/A   Occupational History  . Retired Retired   Social History Main Topics  . Smoking status: Never Smoker  . Smokeless tobacco: Never Used  . Alcohol use No  . Drug use: No  . Sexual activity: Yes    Birth control/ protection: Surgical   Other Topics Concern  . Not on file   Social History Narrative  . No narrative on file    Current Outpatient Prescriptions on File Prior to Visit  Medication Sig Dispense Refill  . ACCU-CHEK AVIVA PLUS test strip     . albuterol (PROVENTIL HFA;VENTOLIN HFA) 108 (90 BASE) MCG/ACT inhaler Inhale 2 puffs into the lungs every 4 (four) hours as needed.      Marland Kitchen aspirin 81 MG tablet Take 81 mg by mouth daily.    Marland Kitchen atorvastatin (LIPITOR) 40 MG tablet TAKE ONE (1) TABLET EACH DAY 90 tablet 3  . baclofen (LIORESAL) 10 MG tablet Take 10 mg by mouth daily.    . canagliflozin (INVOKANA) 100 MG TABS tablet Take 1 tablet (100 mg total) by mouth daily before breakfast. 15 tablet 11  . carvedilol (  COREG) 3.125 MG tablet TAKE ONE TABLET BY MOUTH TWICE A DAY 180 tablet 3  . cycloSPORINE (RESTASIS) 0.05 % ophthalmic emulsion Place 1 drop into both eyes 2 (two) times daily.      Marland Kitchen estradiol (ESTRACE) 0.5 MG tablet Take 1 tablet (0.5 mg total) by mouth daily. 90 tablet 3  . Fluticasone Furoate-Vilanterol 100-25 MCG/INH AEPB Inhale 1 puff into the lungs daily.    . furosemide (LASIX) 20 MG tablet May take Lasix 20 mg as NEEDED for leg swelling 45 tablet 3  . Hydrocodone-Acetaminophen 2.5-325 MG TABS Take 5-325 mg by mouth as needed.    . Insulin Pen Needle (NOVOTWIST) 32G X 5 MM MISC Use as directed once a day.   V250.00 100 each 6  . levothyroxine  (SYNTHROID, LEVOTHROID) 50 MCG tablet TAKE ONE (1) TABLET EACH DAY 30 tablet 2  . lisinopril (PRINIVIL,ZESTRIL) 5 MG tablet Take 5 mg by mouth every morning.      . metFORMIN (GLUCOPHAGE-XR) 500 MG 24 hr tablet TAKE TWO (2) TABLETS BY MOUTH DAILY 60 tablet 11  . Multiple Vitamins-Minerals (MULTIVITAMINS THER. W/MINERALS) TABS Take 1 tablet by mouth every morning.      Marland Kitchen NOVOFINE 32G X 6 MM MISC USE ONCE DAILY 100 each 1  . omeprazole (PRILOSEC) 20 MG capsule Take 20 mg by mouth every morning.     . pregabalin (LYRICA) 150 MG capsule Take 150 mg by mouth 2 (two) times daily.     . rizatriptan (MAXALT-MLT) 10 MG disintegrating tablet Take 10 mg by mouth daily as needed. May repeat in 2 hours if needed    . venlafaxine (EFFEXOR-XR) 150 MG 24 hr capsule Take 150 mg by mouth every morning.     Marland Kitchen VICTOZA 18 MG/3ML SOPN INJECT 1.8MG  SUBCUTANEOUSLY DAILY 9 mL 11  . Magnesium 100 MG CAPS Take 100 mg by mouth once.     No current facility-administered medications on file prior to visit.     Allergies  Allergen Reactions  . Betadine [Povidone Iodine] Hives  . Penicillins Rash    Family History  Problem Relation Age of Onset  . Heart disease Mother   . Asthma Mother   . COPD Father   . Heart disease Father   . Asthma Daughter   . Migraines Daughter   . Neuropathy Son   . Cancer Neg Hx   . Anesthesia problems Neg Hx   . Hypotension Neg Hx   . Malignant hyperthermia Neg Hx   . Pseudochol deficiency Neg Hx     BP 118/74 (BP Location: Left Arm, Patient Position: Sitting)   Pulse 92   Wt 169 lb (76.7 kg)   SpO2 93%   BMI 27.28 kg/m    Review of Systems She denies hypoglycemia.      Objective:   Physical Exam VITAL SIGNS:  See vs page GENERAL: no distress Pulses: foot pulses are intact bilaterally.   MSK: no deformity of the feet or ankles.  CV: 1+ bilat leg edema of the legs, and bilat vv's  Skin:  no ulcer on the feet or ankles.  normal color and temp on the feet and  ankles Neuro: sensation is intact to touch on the feet and ankles.   Ext: There is bilateral onychomycosis of the toenails.   Lab Results  Component Value Date   CREATININE 0.72 10/03/2016   BUN 12 10/03/2016   NA 141 10/03/2016   K 4.3 10/03/2016   CL 105 10/03/2016  CO2 25 10/03/2016   A1c=7.6%     Assessment & Plan:  Type 2 DM: she needs increased rx, if it can be done with a regimen that avoids or minimizes hypoglycemia.   Patient Instructions  check your blood sugar once a day.  vary the time of day when you check, between before the 3 meals, and at bedtime.  also check if you have symptoms of your blood sugar being too high or too low.  please keep a record of the readings and bring it to your next appointment here (or you can bring the meter itself).  You can write it on any piece of paper.  please call us sooner if your blood sugar goes below 70, or if you have a lot of readings over 200. I have sent a prescription to your pharmacy, to double the repaglinide.   Please come White for a follow-up appointment in 4 months.

## 2017-09-04 NOTE — Patient Instructions (Addendum)
check your blood sugar once a day.  vary the time of day when you check, between before the 3 meals, and at bedtime.  also check if you have symptoms of your blood sugar being too high or too low.  please keep a record of the readings and bring it to your next appointment here (or you can bring the meter itself).  You can write it on any piece of paper.  please call us sooner if your blood sugar goes below 70, or if you have a lot of readings over 200. I have sent a prescription to your pharmacy, to double the repaglinide.   Please come back for a follow-up appointment in 4 months.

## 2017-09-20 ENCOUNTER — Other Ambulatory Visit: Payer: Self-pay | Admitting: Endocrinology

## 2017-09-26 ENCOUNTER — Other Ambulatory Visit: Payer: Self-pay | Admitting: Endocrinology

## 2017-10-09 ENCOUNTER — Other Ambulatory Visit: Payer: Self-pay

## 2017-10-09 MED ORDER — METFORMIN HCL ER 500 MG PO TB24: ORAL_TABLET | ORAL | 6 refills | Status: DC

## 2017-10-09 MED ORDER — REPAGLINIDE 1 MG PO TABS: ORAL_TABLET | ORAL | 3 refills | Status: DC

## 2017-10-10 ENCOUNTER — Encounter: Payer: Self-pay | Admitting: Cardiology

## 2017-10-10 ENCOUNTER — Ambulatory Visit (INDEPENDENT_AMBULATORY_CARE_PROVIDER_SITE_OTHER): Payer: Medicare Other | Admitting: Cardiology

## 2017-10-10 ENCOUNTER — Telehealth: Payer: Self-pay | Admitting: Endocrinology

## 2017-10-10 VITALS — BP 106/68 | HR 75 | Ht 66.0 in | Wt 169.0 lb

## 2017-10-10 DIAGNOSIS — R600 Localized edema: Secondary | ICD-10-CM | POA: Diagnosis not present

## 2017-10-10 DIAGNOSIS — E782 Mixed hyperlipidemia: Secondary | ICD-10-CM | POA: Diagnosis not present

## 2017-10-10 DIAGNOSIS — I10 Essential (primary) hypertension: Secondary | ICD-10-CM | POA: Diagnosis not present

## 2017-10-10 DIAGNOSIS — R002 Palpitations: Secondary | ICD-10-CM

## 2017-10-10 NOTE — Patient Instructions (Signed)

## 2017-10-10 NOTE — Telephone Encounter (Signed)
[name] White from [facility] stated patient need a 90 day supply 180 tab for Glucophage xr 500 mg. ( Only 90 tab was sent) Please resend

## 2017-10-10 NOTE — Progress Notes (Signed)
Clinical Summary [name] White is a 76 y.o.female seen today for follow up of the following medical problems.   1. Palpitations  -no recent palpitations - compliant with meds  2. HTN  - home bp's at home typically 110s/70s   3. Hyperlipidemia - she is compliant with statin  4. LE edema - seen by vascular, evidence of bilateral venous reflux - 06/2015 [place] no DVT - echo 06/2014 LVEF 60-65%, grade II diastolic dysfunction. Has some LE edema but no SOB/DOE, no orthopnea or PND. Has lasix to take prn   - denies recent troubles. Wears compression stockings.   Labs coming up with pcp Past Medical History:  Diagnosis Date  . Asthma   . ASYMPTOMATIC POSTMENOPAUSAL STATUS 02/09/2009  . Constipation 03/19/2013  . DIABETES MELLITUS, TYPE II 09/14/2007  . HYPERCHOLESTEROLEMIA 02/09/2009  . HYPERTENSION 02/09/2009  . HYPOTHYROIDISM 09/14/2007  . MIGRAINE HEADACHE 09/14/2007  . Neuropathy   . OSTEOPOROSIS 02/09/2009  . PANCREATITIS 09/14/2007  . 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 PONV (postoperative nausea and vomiting)   . Rectocele 03/19/2013  . Shortness of breath   . SUPERFICIAL PHLEBITIS 09/14/2007  . Varicose veins      Allergies  Allergen Reactions  . Betadine [Povidone Iodine] Hives  . Penicillins Rash     Current Outpatient Prescriptions  Medication Sig Dispense Refill  . ACCU-CHEK AVIVA PLUS test strip     . albuterol (PROVENTIL HFA;VENTOLIN HFA) 108 (90 BASE) MCG/ACT inhaler Inhale 2 puffs into the lungs every 4 (four) hours as needed.      Marland Kitchen aspirin 81 MG tablet Take 81 mg by mouth daily.    Marland Kitchen atorvastatin (LIPITOR) 40 MG tablet TAKE ONE (1) TABLET EACH DAY 90 tablet 3  . baclofen (LIORESAL) 10 MG tablet Take 10 mg by mouth daily.    . canagliflozin (INVOKANA) 100 MG TABS tablet Take 1 tablet (100 mg total) by mouth daily before breakfast. 15 tablet 11  . carvedilol (COREG) 3.125 MG tablet TAKE ONE TABLET BY MOUTH TWICE A DAY 180 tablet 3  . cycloSPORINE (RESTASIS)  0.05 % ophthalmic emulsion Place 1 drop into both eyes 2 (two) times daily.      Marland Kitchen estradiol (ESTRACE) 0.5 MG tablet Take 1 tablet (0.5 mg total) by mouth daily. 90 tablet 3  . Fluticasone Furoate-Vilanterol 100-25 MCG/INH AEPB Inhale 1 puff into the lungs daily.    . furosemide (LASIX) 20 MG tablet May take Lasix 20 mg as NEEDED for leg swelling 45 tablet 3  . Hydrocodone-Acetaminophen 2.5-325 MG TABS Take 5-325 mg by mouth as needed.    . Insulin Pen Needle (NOVOTWIST) 32G X 5 MM MISC Use as directed once a day.   V250.00 100 each 6  . levothyroxine (SYNTHROID, LEVOTHROID) 50 MCG tablet TAKE ONE (1) TABLET EACH DAY 30 tablet 2  . lisinopril (PRINIVIL,ZESTRIL) 5 MG tablet Take 5 mg by mouth every morning.      . Magnesium 100 MG CAPS Take 100 mg by mouth once.    . metFORMIN (GLUCOPHAGE-XR) 500 MG 24 hr tablet TAKE TWO (2) TABLETS BY MOUTH DAILY 90 tablet 6  . Multiple Vitamins-Minerals (MULTIVITAMINS THER. W/MINERALS) TABS Take 1 tablet by mouth every morning.      Marland Kitchen NOVOFINE 32G X 6 MM MISC USE ONCE DAILY 100 each 2  . omeprazole (PRILOSEC) 20 MG capsule Take 20 mg by mouth every morning.     . pregabalin (LYRICA) 150 MG capsule Take 150 mg by mouth  2 (two) times daily.     . repaglinide (PRANDIN) 1 MG tablet 1 tab with breakfast, 4 with lunch, and 4 with supper 810 tablet 3  . rizatriptan (MAXALT-MLT) 10 MG disintegrating tablet Take 10 mg by mouth daily as needed. May repeat in 2 hours if needed    . venlafaxine (EFFEXOR-XR) 150 MG 24 hr capsule Take 150 mg by mouth every morning.     Marland Kitchen VICTOZA 18 MG/3ML SOPN INJECT 1.8MG SUBCUTANEOUSLY DAILY 9 mL 1   No current facility-administered medications for this visit.      Past Surgical History:  Procedure Laterality Date  . ABDOMINAL HYSTERECTOMY    . ANTERIOR AND POSTERIOR REPAIR N/A 04/02/2013   Procedure: ANTERIOR (CYSTOCELE) AND POSTERIOR REPAIR (RECTOCELE);  Surgeon: Jonnie Kind, MD;  Location: [facility];  Service: Gynecology;   Laterality: N/A;  . CATARACT EXTRACTION W/PHACO  12/05/2011   Procedure: CATARACT EXTRACTION PHACO AND INTRAOCULAR LENS PLACEMENT (IOC);  Surgeon: Tonny Jeselle Hiser;  Location: [facility];  Service: Ophthalmology;  Laterality: Left;  CDE:9.65  . CHOLECYSTECTOMY    . CYSTOSCOPY    . ENDOVENOUS ABLATION SAPHENOUS VEIN W/ LASER  11-03-2011   right greater saphenous vein   left leg done 10-2011  . EYE SURGERY  98   right cataract extraction 98  . LAPAROSCOPIC NISSEN FUNDOPLICATION    . NM ESOPHAGEAL REFLUX  08-11-11  . PITUITARY EXCISION  10/1997  . POSTERIOR REPAIR    . TRANSPHENOIDAL / TRANSNASAL HYPOPHYSECTOMY / RESECTION PITUITARY TUMOR  08-11-11     Allergies  Allergen Reactions  . Betadine [Povidone Iodine] Hives  . Penicillins Rash      Family History  Problem Relation Age of Onset  . Heart disease Mother   . Asthma Mother   . COPD Father   . Heart disease Father   . Asthma Daughter   . Migraines Daughter   . Neuropathy Son   . Cancer Neg Hx   . Anesthesia problems Neg Hx   . Hypotension Neg Hx   . Malignant hyperthermia Neg Hx   . Pseudochol deficiency Neg Hx      Social History [name] White reports that she has never smoked. She has never used smokeless tobacco. [name] White reports that she does not drink alcohol.   Review of Systems CONSTITUTIONAL: No weight loss, fever, chills, weakness or fatigue.  HEENT: Eyes: No visual loss, blurred vision, double vision or yellow sclerae.No hearing loss, sneezing, congestion, runny nose or sore throat.  SKIN: No rash or itching.  CARDIOVASCULAR: per hpi RESPIRATORY: No shortness of breath, cough or sputum.  GASTROINTESTINAL: No anorexia, nausea, vomiting or diarrhea. No abdominal pain or blood.  GENITOURINARY: No burning on urination, no polyuria NEUROLOGICAL: No headache, dizziness, syncope, paralysis, ataxia, numbness or tingling in the extremities. No change in bowel or bladder control.  MUSCULOSKELETAL: No muscle, back pain,  joint pain or stiffness.  LYMPHATICS: No enlarged nodes. No history of splenectomy.  PSYCHIATRIC: No history of depression or anxiety.  ENDOCRINOLOGIC: No reports of sweating, cold or heat intolerance. No polyuria or polydipsia.  Marland Kitchen   Physical Examination Vitals:   10/10/17 1408  BP: 106/68  Pulse: 75  SpO2: 95%   Vitals:   10/10/17 1408  Weight: 169 lb (76.7 kg)  Height: _0  (1.676 m)    Gen: resting comfortably, no acute distress HEENT: no scleral icterus, pupils equal round and reactive, no palptable cervical adenopathy,  CV: RRR, no m/r/g, no jvd Resp: Clear to  auscultation bilaterally GI: abdomen is soft, non-tender, non-distended, normal bowel sounds, no hepatosplenomegaly MSK: extremities are warm, no edema.  Skin: warm, no rash Neuro:  no focal deficits Psych: appropriate affect   Diagnostic Studies 06/2014 Echo Study Conclusions  - Left ventricle: The cavity size was normal. Wall thickness was increased in a pattern of mild LVH. Systolic function was normal. The estimated ejection fraction was in the range of 60% to 65%. Wall motion was normal; there were no regional wall motion abnormalities. Features are consistent with a pseudonormal left ventricular filling pattern, with concomitant abnormal relaxation and increased filling pressure (grade 2 diastolic dysfunction). - Aortic valve: There was trivial regurgitation. - Mitral valve: Calcified annulus. There was mild regurgitation. - Left atrium: The atrium was moderately dilated. - Right atrium: Central venous pressure (est): 3 mm Hg. - Tricuspid valve: There was trivial regurgitation. - Pulmonary arteries: Systolic pressure could not be accurately estimated. - Pericardium, extracardiac: There was no pericardial effusion.  Impressions:  - Mild LVH with LVEF 82-08%, grade 2 diastolic dysfunction. Moderate left atrial enlargement. MAC with mild mitral regurgitation. Unable to assess  PASP.    Assessment and Plan  1. Palpitations -no symptoms, continue current meds - EKG in clinic shows NSR  2. HTN -bp at goal, continue current meds  3. Hyperlipidemia - request labs from pcp, continue statin  4. LE edema - controlled, continue current therapy.    F/u 1 year   Arnoldo Lenis, M.D.

## 2017-10-11 ENCOUNTER — Other Ambulatory Visit: Payer: Self-pay

## 2017-10-11 MED ORDER — METFORMIN HCL ER 500 MG PO TB24: ORAL_TABLET | ORAL | 3 refills | Status: DC

## 2017-10-19 ENCOUNTER — Other Ambulatory Visit: Payer: Self-pay

## 2017-10-19 MED ORDER — LEVOTHYROXINE SODIUM 50 MCG PO TABS: ORAL_TABLET | ORAL | 2 refills | Status: DC

## 2017-12-20 ENCOUNTER — Other Ambulatory Visit: Payer: Self-pay | Admitting: Obstetrics and Gynecology

## 2017-12-20 NOTE — Telephone Encounter (Signed)
refil estradiol x 6 months Will need to be seen to consider further tx.

## 2017-12-25 ENCOUNTER — Other Ambulatory Visit: Payer: Self-pay | Admitting: Endocrinology

## 2018-01-05 ENCOUNTER — Encounter: Payer: Self-pay | Admitting: Endocrinology

## 2018-01-05 ENCOUNTER — Ambulatory Visit: Payer: Medicare Other | Admitting: Endocrinology

## 2018-01-05 VITALS — BP 92/70 | HR 87 | Wt 170.6 lb

## 2018-01-05 DIAGNOSIS — E119 Type 2 diabetes mellitus without complications: Secondary | ICD-10-CM

## 2018-01-05 LAB — POCT GLYCOSYLATED HEMOGLOBIN (HGB A1C): Hemoglobin A1C: 7.3

## 2018-01-05 NOTE — Progress Notes (Signed)
Subjective:    Patient ID: [name] White, female    DOB: [birth year], 77 y.o.   MRN: [id number]  HPI Pt returns for f/u of diabetes mellitus: DM type: 2 Dx'ed: 1999, after an episode of pancreatitis (no pancreatitis since then).  Complications: none Therapy: 3 oral meds + victoza.  GDM: never DKA: never Severe hypoglycemia: never.    Other: she has never taken insulin; she cannot take bromocriptine, due to an interaction with maxalt; pioglitizone has caused edema.  Interval history: no cbg record, but states cbg's are well-controlled.  pt states she feels well in general.  She seldom has hypoglycemia, and these episodes are mild.   Past Medical History:  Diagnosis Date  . Asthma   . ASYMPTOMATIC POSTMENOPAUSAL STATUS 02/09/2009  . Constipation 03/19/2013  . DIABETES MELLITUS, TYPE II 09/14/2007  . HYPERCHOLESTEROLEMIA 02/09/2009  . HYPERTENSION 02/09/2009  . HYPOTHYROIDISM 09/14/2007  . MIGRAINE HEADACHE 09/14/2007  . Neuropathy   . OSTEOPOROSIS 02/09/2009  . PANCREATITIS 09/14/2007  . 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 PONV (postoperative nausea and vomiting)   . Rectocele 03/19/2013  . Shortness of breath   . SUPERFICIAL PHLEBITIS 09/14/2007  . Varicose veins     Past Surgical History:  Procedure Laterality Date  . ABDOMINAL HYSTERECTOMY    . ANTERIOR AND POSTERIOR REPAIR N/A 04/02/2013   Procedure: ANTERIOR (CYSTOCELE) AND POSTERIOR REPAIR (RECTOCELE);  Surgeon: Jonnie Kind, MD;  Location: [facility];  Service: Gynecology;  Laterality: N/A;  . CATARACT EXTRACTION W/PHACO  12/05/2011   Procedure: CATARACT EXTRACTION PHACO AND INTRAOCULAR LENS PLACEMENT (IOC);  Surgeon: Tonny Branch;  Location: [facility];  Service: Ophthalmology;  Laterality: Left;  CDE:9.65  . CHOLECYSTECTOMY    . CYSTOSCOPY    . ENDOVENOUS ABLATION SAPHENOUS VEIN W/ LASER  11-03-2011   right greater saphenous vein   left leg done 10-2011  . EYE SURGERY  98   right cataract extraction 98  . LAPAROSCOPIC NISSEN  FUNDOPLICATION    . NM ESOPHAGEAL REFLUX  08-11-11  . PITUITARY EXCISION  10/1997  . POSTERIOR REPAIR    . TRANSPHENOIDAL / TRANSNASAL HYPOPHYSECTOMY / RESECTION PITUITARY TUMOR  08-11-11    Social History   Socioeconomic History  . Marital status: Married    Spouse name: Not on file  . Number of children: Not on file  . Years of education: Not on file  . Highest education level: Not on file  Social Needs  . Financial resource strain: Not on file  . Food insecurity - worry: Not on file  . Food insecurity - inability: Not on file  . Transportation needs - medical: Not on file  . Transportation needs - non-medical: Not on file  Occupational History  . Occupation: Retired    Fish farm manager: RETIRED  Tobacco Use  . Smoking status: Never Smoker  . Smokeless tobacco: Never Used  Substance and Sexual Activity  . Alcohol use: No    Alcohol/week: 0.0 oz  . Drug use: No  . Sexual activity: Yes    Birth control/protection: Surgical  Other Topics Concern  . Not on file  Social History Narrative  . Not on file    Current Outpatient Medications on File Prior to Visit  Medication Sig Dispense Refill  . ACCU-CHEK AVIVA PLUS test strip     . albuterol (PROVENTIL HFA;VENTOLIN HFA) 108 (90 BASE) MCG/ACT inhaler Inhale 2 puffs into the lungs every 4 (four) hours as needed.      Marland Kitchen aspirin 81 MG tablet  Take 81 mg by mouth daily.    Marland Kitchen atorvastatin (LIPITOR) 40 MG tablet TAKE ONE (1) TABLET EACH DAY 90 tablet 3  . baclofen (LIORESAL) 10 MG tablet Take 10 mg by mouth daily.    . canagliflozin (INVOKANA) 100 MG TABS tablet Take 1 tablet (100 mg total) by mouth daily before breakfast. 15 tablet 11  . carvedilol (COREG) 3.125 MG tablet TAKE ONE TABLET BY MOUTH TWICE A DAY 180 tablet 3  . cycloSPORINE (RESTASIS) 0.05 % ophthalmic emulsion Place 1 drop into both eyes 2 (two) times daily.      Marland Kitchen estradiol (ESTRACE) 0.5 MG tablet TAKE ONE TABLET BY MOUTH DAILY. 90 tablet 1  . furosemide (LASIX) 20 MG  tablet May take Lasix 20 mg as NEEDED for leg swelling 45 tablet 3  . Hydrocodone-Acetaminophen 2.5-325 MG TABS Take 5-325 mg by mouth as needed.    . Insulin Pen Needle (NOVOTWIST) 32G X 5 MM MISC Use as directed once a day.   V250.00 100 each 6  . levothyroxine (SYNTHROID, LEVOTHROID) 50 MCG tablet TAKE ONE (1) TABLET EACH DAY 90 tablet 2  . lisinopril (PRINIVIL,ZESTRIL) 5 MG tablet Take 5 mg by mouth every morning.      . metFORMIN (GLUCOPHAGE-XR) 500 MG 24 hr tablet TAKE TWO (2) TABLETS BY MOUTH DAILY 180 tablet 3  . Multiple Vitamins-Minerals (MULTIVITAMINS THER. W/MINERALS) TABS Take 1 tablet by mouth every morning.      Marland Kitchen NOVOFINE 32G X 6 MM MISC USE ONCE DAILY 100 each 2  . omeprazole (PRILOSEC) 20 MG capsule Take 20 mg by mouth every morning.     . pregabalin (LYRICA) 150 MG capsule Take 150 mg by mouth 2 (two) times daily.     . repaglinide (PRANDIN) 1 MG tablet 1 tab with breakfast, 4 with lunch, and 4 with supper 810 tablet 3  . rizatriptan (MAXALT-MLT) 10 MG disintegrating tablet Take 10 mg by mouth daily as needed. May repeat in 2 hours if needed    . venlafaxine (EFFEXOR-XR) 150 MG 24 hr capsule Take 150 mg by mouth every morning.     Marland Kitchen VICTOZA 18 MG/3ML SOPN INJECT 1.8MG  SUBCUTANEOUSLY DAILY 9 mL 1   No current facility-administered medications on file prior to visit.     Allergies  Allergen Reactions  . Betadine [Povidone Iodine] Hives  . Penicillins Rash    Family History  Problem Relation Age of Onset  . Heart disease Mother   . Asthma Mother   . COPD Father   . Heart disease Father   . Asthma Daughter   . Migraines Daughter   . Neuropathy Son   . Cancer Neg Hx   . Anesthesia problems Neg Hx   . Hypotension Neg Hx   . Malignant hyperthermia Neg Hx   . Pseudochol deficiency Neg Hx     BP 92/70 (BP Location: Left Arm, Patient Position: Sitting, Cuff Size: Normal)   Pulse 87   Wt 170 lb 9.6 oz (77.4 kg)   SpO2 94%   BMI 27.54 kg/m    Review of  Systems Denies LOC    Objective:   Physical Exam VITAL SIGNS:  See vs page GENERAL: no distress Pulses: foot pulses are intact bilaterally.   MSK: no deformity of the feet or ankles.  CV: 1+ bilat leg edema of the legs, and bilat vv's  Skin:  no ulcer on the feet or ankles.  normal color and temp on the feet and ankles Neuro: sensation  is intact to touch on the feet and ankles.   Ext: There is bilateral onychomycosis of the toenails.   Lab Results  Component Value Date   HGBA1C 7.3 01/05/2018       Assessment & Plan:  type 2 DM: well-controlled Edema: this limits rx options  Patient Instructions  check your blood sugar once a day.  vary the time of day when you check, between before the 3 meals, and at bedtime.  also check if you have symptoms of your blood sugar being too high or too low.  please keep a record of the readings and bring it to your next appointment here (or you can bring the meter itself).  You can write it on any piece of paper.  please call us sooner if your blood sugar goes below 70, or if you have a lot of readings over 200. Please continue the same medications for diabetes. Please come White for a follow-up appointment in 4 months.

## 2018-01-05 NOTE — Patient Instructions (Signed)
check your blood sugar once a day.  vary the time of day when you check, between before the 3 meals, and at bedtime.  also check if you have symptoms of your blood sugar being too high or too low.  please keep a record of the readings and bring it to your next appointment here (or you can bring the meter itself).  You can write it on any piece of paper.  please call us sooner if your blood sugar goes below 70, or if you have a lot of readings over 200. Please continue the same medications for diabetes.  Please come back for a follow-up appointment in 4 months.      

## 2018-01-11 DIAGNOSIS — Z0000 Encounter for general adult medical examination without abnormal findings: Secondary | ICD-10-CM | POA: Diagnosis not present

## 2018-01-15 ENCOUNTER — Other Ambulatory Visit (HOSPITAL_COMMUNITY): Payer: Self-pay | Admitting: Pulmonary Disease

## 2018-01-15 DIAGNOSIS — Z780 Asymptomatic menopausal state: Secondary | ICD-10-CM

## 2018-01-16 ENCOUNTER — Other Ambulatory Visit (HOSPITAL_COMMUNITY)
Admission: RE | Admit: 2018-01-16 | Discharge: 2018-01-16 | Disposition: A | Discharge status: Home / Self Care | Payer: Medicare Other | Source: Ambulatory Visit | Attending: Pulmonary Disease | Admitting: Pulmonary Disease

## 2018-01-16 DIAGNOSIS — F321 Major depressive disorder, single episode, moderate: Secondary | ICD-10-CM | POA: Insufficient documentation

## 2018-01-16 DIAGNOSIS — K210 Gastro-esophageal reflux disease with esophagitis: Secondary | ICD-10-CM | POA: Diagnosis not present

## 2018-01-16 DIAGNOSIS — Z0000 Encounter for general adult medical examination without abnormal findings: Secondary | ICD-10-CM | POA: Diagnosis not present

## 2018-01-16 DIAGNOSIS — J45909 Unspecified asthma, uncomplicated: Secondary | ICD-10-CM | POA: Diagnosis not present

## 2018-01-16 DIAGNOSIS — I10 Essential (primary) hypertension: Secondary | ICD-10-CM | POA: Diagnosis not present

## 2018-01-16 LAB — COMPREHENSIVE METABOLIC PANEL
ALBUMIN: 3.7 g/dL (ref 3.5–5.0)
ALK PHOS: 34 U/L — AB (ref 38–126)
ALT: 27 U/L (ref 14–54)
ANION GAP: 11 (ref 5–15)
AST: 27 U/L (ref 15–41)
BILIRUBIN TOTAL: 0.8 mg/dL (ref 0.3–1.2)
BUN: 12 mg/dL (ref 6–20)
CALCIUM: 8.8 mg/dL — AB (ref 8.9–10.3)
CO2: 25 mmol/L (ref 22–32)
Chloride: 102 mmol/L (ref 101–111)
Creatinine, Ser: 0.59 mg/dL (ref 0.44–1.00)
GFR calc Af Amer: >60 mL/min (ref >60)
GFR calc non Af Amer: >60 mL/min (ref >60)
GLUCOSE: 170 mg/dL — AB (ref 65–99)
POTASSIUM: 4.5 mmol/L (ref 3.5–5.1)
Sodium: 138 mmol/L (ref 135–145)
Total Protein: 6.8 g/dL (ref 6.5–8.1)

## 2018-01-16 LAB — CBC
HEMATOCRIT: 42.3 % (ref 36.0–46.0)
HEMOGLOBIN: 13.5 g/dL (ref 12.0–15.0)
MCH: 28.4 pg (ref 26.0–34.0)
MCHC: 31.9 g/dL (ref 30.0–36.0)
MCV: 88.9 fL (ref 78.0–100.0)
Platelets: 246 10*3/uL (ref 150–400)
RBC: 4.76 MIL/uL (ref 3.87–5.11)
RDW: 15.2 % (ref 11.5–15.5)
WBC: 9.6 10*3/uL (ref 4.0–10.5)

## 2018-01-16 LAB — LIPID PANEL
CHOL/HDL RATIO: 2.7 ratio
Cholesterol: 113 mg/dL (ref 0–200)
HDL: 42 mg/dL (ref >40)
LDL CALC: 48 mg/dL (ref 0–99)
Triglycerides: 116 mg/dL (ref <150)
VLDL: 23 mg/dL (ref 0–40)

## 2018-01-16 LAB — TSH: TSH: 2.753 u[IU]/mL (ref 0.350–4.500)

## 2018-01-17 LAB — VITAMIN D 25 HYDROXY (VIT D DEFICIENCY, FRACTURES): Vit D, 25-Hydroxy: 17.2 ng/mL — ABNORMAL LOW (ref 30.0–100.0)

## 2018-01-17 LAB — MICROALBUMIN, URINE: Microalb, Ur: 30.4 ug/mL — ABNORMAL HIGH

## 2018-01-22 ENCOUNTER — Ambulatory Visit (HOSPITAL_COMMUNITY)
Admission: RE | Admit: 2018-01-22 | Discharge: 2018-01-22 | Disposition: A | Discharge status: Home / Self Care | Payer: Medicare Other | Source: Ambulatory Visit | Attending: Pulmonary Disease | Admitting: Pulmonary Disease

## 2018-01-22 DIAGNOSIS — Z13820 Encounter for screening for osteoporosis: Secondary | ICD-10-CM | POA: Insufficient documentation

## 2018-01-22 DIAGNOSIS — Z780 Asymptomatic menopausal state: Secondary | ICD-10-CM

## 2018-01-22 LAB — HM DEXA SCAN: HM Dexa Scan: NORMAL

## 2018-04-02 ENCOUNTER — Other Ambulatory Visit: Payer: Self-pay | Admitting: Endocrinology

## 2018-04-04 ENCOUNTER — Encounter (INDEPENDENT_AMBULATORY_CARE_PROVIDER_SITE_OTHER): Payer: Self-pay

## 2018-04-04 DIAGNOSIS — I868 Varicose veins of other specified sites: Secondary | ICD-10-CM

## 2018-05-04 ENCOUNTER — Ambulatory Visit: Payer: Medicare Other | Admitting: Endocrinology

## 2018-05-04 ENCOUNTER — Encounter: Payer: Self-pay | Admitting: Endocrinology

## 2018-05-04 VITALS — BP 110/64 | HR 84 | Wt 169.2 lb

## 2018-05-04 DIAGNOSIS — E119 Type 2 diabetes mellitus without complications: Secondary | ICD-10-CM

## 2018-05-04 LAB — POCT GLYCOSYLATED HEMOGLOBIN (HGB A1C): HEMOGLOBIN A1C: 7.6

## 2018-05-04 NOTE — Progress Notes (Signed)
Subjective:    Patient ID: [name] White, female    DOB: [birth year], 77 y.o.   MRN: [id number]  HPI Pt returns for f/u of diabetes mellitus: DM type: 2 Dx'ed: 1999, after an episode of pancreatitis (no pancreatitis since then).  Complications: none Therapy: 3 oral meds + victoza.  GDM: never DKA: never Severe hypoglycemia: never.    Other: she has never taken insulin; she cannot take bromocriptine, due to an interaction with maxalt; pioglitizone has caused edema.   Interval history: no cbg record, but states cbg's are well-controlled.  pt states she feels well in general.   Past Medical History:  Diagnosis Date  . Asthma   . ASYMPTOMATIC POSTMENOPAUSAL STATUS 02/09/2009  . Constipation 03/19/2013  . DIABETES MELLITUS, TYPE II 09/14/2007  . HYPERCHOLESTEROLEMIA 02/09/2009  . HYPERTENSION 02/09/2009  . HYPOTHYROIDISM 09/14/2007  . MIGRAINE HEADACHE 09/14/2007  . Neuropathy   . OSTEOPOROSIS 02/09/2009  . PANCREATITIS 09/14/2007  . 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 PONV (postoperative nausea and vomiting)   . Rectocele 03/19/2013  . Shortness of breath   . SUPERFICIAL PHLEBITIS 09/14/2007  . Varicose veins     Past Surgical History:  Procedure Laterality Date  . ABDOMINAL HYSTERECTOMY    . ANTERIOR AND POSTERIOR REPAIR N/A 04/02/2013   Procedure: ANTERIOR (CYSTOCELE) AND POSTERIOR REPAIR (RECTOCELE);  Surgeon: Jonnie Kind, MD;  Location: [facility];  Service: Gynecology;  Laterality: N/A;  . CATARACT EXTRACTION W/PHACO  12/05/2011   Procedure: CATARACT EXTRACTION PHACO AND INTRAOCULAR LENS PLACEMENT (IOC);  Surgeon: Tonny Branch;  Location: [facility];  Service: Ophthalmology;  Laterality: Left;  CDE:9.65  . CHOLECYSTECTOMY    . CYSTOSCOPY    . ENDOVENOUS ABLATION SAPHENOUS VEIN W/ LASER  11-03-2011   right greater saphenous vein   left leg done 10-2011  . EYE SURGERY  98   right cataract extraction 98  . LAPAROSCOPIC NISSEN FUNDOPLICATION    . NM ESOPHAGEAL REFLUX  08-11-11  . PITUITARY  EXCISION  10/1997  . POSTERIOR REPAIR    . TRANSPHENOIDAL / TRANSNASAL HYPOPHYSECTOMY / RESECTION PITUITARY TUMOR  08-11-11    Social History   Socioeconomic History  . Marital status: Married    Spouse name: Not on file  . Number of children: Not on file  . Years of education: Not on file  . Highest education level: Not on file  Occupational History  . Occupation: Retired    Fish farm manager: RETIRED  Social Needs  . Financial resource strain: Not on file  . Food insecurity:    Worry: Not on file    Inability: Not on file  . Transportation needs:    Medical: Not on file    Non-medical: Not on file  Tobacco Use  . Smoking status: Never Smoker  . Smokeless tobacco: Never Used  Substance and Sexual Activity  . Alcohol use: No    Alcohol/week: 0.0 oz  . Drug use: No  . Sexual activity: Yes    Birth control/protection: Surgical  Lifestyle  . Physical activity:    Days per week: Not on file    Minutes per session: Not on file  . Stress: Not on file  Relationships  . Social connections:    Talks on phone: Not on file    Gets together: Not on file    Attends religious service: Not on file    Active member of club or organization: Not on file    Attends meetings of clubs or organizations: Not on file  Relationship status: Not on file  . Intimate partner violence:    Fear of current or ex partner: Not on file    Emotionally abused: Not on file    Physically abused: Not on file    Forced sexual activity: Not on file  Other Topics Concern  . Not on file  Social History Narrative  . Not on file    Current Outpatient Medications on File Prior to Visit  Medication Sig Dispense Refill  . ACCU-CHEK AVIVA PLUS test strip     . albuterol (PROVENTIL HFA;VENTOLIN HFA) 108 (90 BASE) MCG/ACT inhaler Inhale 2 puffs into the lungs every 4 (four) hours as needed.      Marland Kitchen aspirin 81 MG tablet Take 81 mg by mouth daily.    Marland Kitchen atorvastatin (LIPITOR) 40 MG tablet TAKE ONE (1) TABLET EACH DAY  90 tablet 3  . baclofen (LIORESAL) 10 MG tablet Take 10 mg by mouth daily.    . canagliflozin (INVOKANA) 100 MG TABS tablet Take 1 tablet (100 mg total) by mouth daily before breakfast. 15 tablet 11  . carvedilol (COREG) 3.125 MG tablet TAKE ONE TABLET BY MOUTH TWICE A DAY 180 tablet 3  . cycloSPORINE (RESTASIS) 0.05 % ophthalmic emulsion Place 1 drop into both eyes 2 (two) times daily.      Marland Kitchen estradiol (ESTRACE) 0.5 MG tablet TAKE ONE TABLET BY MOUTH DAILY. 90 tablet 1  . furosemide (LASIX) 20 MG tablet May take Lasix 20 mg as NEEDED for leg swelling 45 tablet 3  . Hydrocodone-Acetaminophen 2.5-325 MG TABS Take 5-325 mg by mouth as needed.    . Insulin Pen Needle (NOVOTWIST) 32G X 5 MM MISC Use as directed once a day.   V250.00 100 each 6  . levothyroxine (SYNTHROID, LEVOTHROID) 50 MCG tablet TAKE ONE (1) TABLET EACH DAY 90 tablet 2  . lisinopril (PRINIVIL,ZESTRIL) 5 MG tablet Take 5 mg by mouth every morning.      . metFORMIN (GLUCOPHAGE-XR) 500 MG 24 hr tablet TAKE TWO (2) TABLETS BY MOUTH DAILY 180 tablet 3  . Multiple Vitamins-Minerals (MULTIVITAMINS THER. W/MINERALS) TABS Take 1 tablet by mouth every morning.      Marland Kitchen NOVOFINE 32G X 6 MM MISC USE ONCE DAILY 100 each 2  . omeprazole (PRILOSEC) 20 MG capsule Take 20 mg by mouth every morning.     . pregabalin (LYRICA) 150 MG capsule Take 150 mg by mouth 2 (two) times daily.     . repaglinide (PRANDIN) 1 MG tablet 1 tab with breakfast, 4 with lunch, and 4 with supper 810 tablet 3  . rizatriptan (MAXALT-MLT) 10 MG disintegrating tablet Take 10 mg by mouth daily as needed. May repeat in 2 hours if needed    . venlafaxine (EFFEXOR-XR) 150 MG 24 hr capsule Take 150 mg by mouth every morning.     Marland Kitchen VICTOZA 18 MG/3ML SOPN INJECT 1.8MG  SUBCUTANEOUSLY DAILY 9 mL 1   No current facility-administered medications on file prior to visit.     Allergies  Allergen Reactions  . Betadine [Povidone Iodine] Hives  . Penicillins Rash    Family History    Problem Relation Age of Onset  . Heart disease Mother   . Asthma Mother   . COPD Father   . Heart disease Father   . Asthma Daughter   . Migraines Daughter   . Neuropathy Son   . Cancer Neg Hx   . Anesthesia problems Neg Hx   . Hypotension Neg Hx   .  Malignant hyperthermia Neg Hx   . Pseudochol deficiency Neg Hx     BP 110/64   Pulse 84   Wt 169 lb 3.2 oz (76.7 kg)   SpO2 91%   BMI 27.31 kg/m    Review of Systems She denies hypoglycemia    Objective:   Physical Exam VITAL SIGNS:  See vs page GENERAL: no distress Pulses: foot pulses are intact bilaterally.   MSK: no deformity of the feet or ankles.  CV: trace bilat leg edema of the legs, and bilat vv's  Skin:  no ulcer on the feet or ankles.  normal color and temp on the feet and ankles Neuro: sensation is intact to touch on the feet and ankles.   Ext: There is bilateral onychomycosis of the toenails.   Lab Results  Component Value Date   HGBA1C 7.6 05/04/2018   Lab Results  Component Value Date   CREATININE 0.59 01/16/2018   BUN 12 01/16/2018   NA 138 01/16/2018   K 4.5 01/16/2018   CL 102 01/16/2018   CO2 25 01/16/2018      Assessment & Plan:  Type 2 DM: worse.  She declines to increase or add medication.   Edema: this limits rx options  Patient Instructions  check your blood sugar once a day.  vary the time of day when you check, between before the 3 meals, and at bedtime.  also check if you have symptoms of your blood sugar being too high or too low.  please keep a record of the readings and bring it to your next appointment here (or you can bring the meter itself).  You can write it on any piece of paper.  please call us sooner if your blood sugar goes below 70, or if you have a lot of readings over 200. Please continue the same medications for diabetes.  Please come White for a follow-up appointment in 4 months.

## 2018-05-04 NOTE — Patient Instructions (Addendum)
check your blood sugar once a day.  vary the time of day when you check, between before the 3 meals, and at bedtime.  also check if you have symptoms of your blood sugar being too high or too low.  please keep a record of the readings and bring it to your next appointment here (or you can bring the meter itself).  You can write it on any piece of paper.  please call us sooner if your blood sugar goes below 70, or if you have a lot of readings over 200. Please continue the same medications for diabetes.  Please come back for a follow-up appointment in 4 months.      

## 2018-05-07 ENCOUNTER — Telehealth: Payer: Self-pay | Admitting: Obstetrics and Gynecology

## 2018-05-07 NOTE — Telephone Encounter (Signed)
Attempted to call patient. Heard busy tone will retry.

## 2018-05-07 NOTE — Telephone Encounter (Signed)
Pt called stating that she would like a call back from Dr. Johnnye Sima nurse retarding her hormone medication. Please contact pt

## 2018-05-10 ENCOUNTER — Telehealth: Payer: Self-pay | Admitting: Obstetrics and Gynecology

## 2018-05-10 NOTE — Telephone Encounter (Signed)
Patient called our after hours nurse regarding a refill of her estradiol that she needed. Pt states she called on Monday and left a message and heard nothing back. Please contact pt

## 2018-05-10 NOTE — Telephone Encounter (Signed)
LMOVM that Dr Glo Herring was out of the office today but will be in tomorrow, so I will have it sent then.

## 2018-05-14 ENCOUNTER — Telehealth: Payer: Self-pay | Admitting: Obstetrics and Gynecology

## 2018-05-14 ENCOUNTER — Other Ambulatory Visit: Payer: Self-pay | Admitting: Obstetrics and Gynecology

## 2018-05-14 MED ORDER — ESTRADIOL 1 MG PO TABS: 1.0000 mg | ORAL_TABLET | Freq: Every day | ORAL | 2 refills | Status: DC

## 2018-05-14 NOTE — Telephone Encounter (Signed)
Will resume prior dosing

## 2018-05-14 NOTE — Progress Notes (Signed)
Returned to 1 mg estradiol Rx per pt symptoms of vasomotor sx.

## 2018-05-15 NOTE — Telephone Encounter (Signed)
Informed patient per Dr Glo Herring she can go back to prior dosing of Estradiol 1mg  daily, refills sent.  Pt verbalized understanding.

## 2018-05-23 ENCOUNTER — Other Ambulatory Visit: Payer: Self-pay | Admitting: Obstetrics and Gynecology

## 2018-05-30 ENCOUNTER — Other Ambulatory Visit: Payer: Self-pay | Admitting: Cardiology

## 2018-07-02 ENCOUNTER — Other Ambulatory Visit: Payer: Self-pay | Admitting: Endocrinology

## 2018-07-11 DIAGNOSIS — J45909 Unspecified asthma, uncomplicated: Secondary | ICD-10-CM | POA: Diagnosis not present

## 2018-07-11 DIAGNOSIS — E785 Hyperlipidemia, unspecified: Secondary | ICD-10-CM | POA: Diagnosis not present

## 2018-07-11 DIAGNOSIS — I10 Essential (primary) hypertension: Secondary | ICD-10-CM | POA: Diagnosis not present

## 2018-07-11 DIAGNOSIS — E039 Hypothyroidism, unspecified: Secondary | ICD-10-CM | POA: Diagnosis not present

## 2018-07-19 ENCOUNTER — Ambulatory Visit (HOSPITAL_COMMUNITY)
Admission: RE | Admit: 2018-07-19 | Discharge: 2018-07-19 | Disposition: A | Discharge status: Home / Self Care | Payer: Medicare Other | Source: Ambulatory Visit | Attending: Pulmonary Disease | Admitting: Pulmonary Disease

## 2018-07-19 ENCOUNTER — Other Ambulatory Visit (HOSPITAL_COMMUNITY): Payer: Self-pay | Admitting: Pulmonary Disease

## 2018-07-19 DIAGNOSIS — M2578 Osteophyte, vertebrae: Secondary | ICD-10-CM | POA: Diagnosis not present

## 2018-07-19 DIAGNOSIS — M545 Low back pain: Secondary | ICD-10-CM

## 2018-08-22 ENCOUNTER — Other Ambulatory Visit: Payer: Self-pay | Admitting: Cardiology

## 2018-08-22 ENCOUNTER — Other Ambulatory Visit: Payer: Self-pay | Admitting: Endocrinology

## 2018-09-04 ENCOUNTER — Ambulatory Visit: Payer: Medicare Other | Admitting: Endocrinology

## 2018-09-13 ENCOUNTER — Encounter: Payer: Self-pay | Admitting: Endocrinology

## 2018-09-13 ENCOUNTER — Ambulatory Visit: Payer: Medicare Other | Admitting: Endocrinology

## 2018-09-13 VITALS — BP 100/62 | HR 75 | Ht 66.0 in | Wt 169.6 lb

## 2018-09-13 DIAGNOSIS — E119 Type 2 diabetes mellitus without complications: Secondary | ICD-10-CM | POA: Diagnosis not present

## 2018-09-13 DIAGNOSIS — Z23 Encounter for immunization: Secondary | ICD-10-CM | POA: Diagnosis not present

## 2018-09-13 LAB — POCT GLYCOSYLATED HEMOGLOBIN (HGB A1C): Hemoglobin A1C: 7.8 % — AB (ref 4.0–5.6)

## 2018-09-13 NOTE — Patient Instructions (Signed)
check your blood sugar once a day.  vary the time of day when you check, between before the 3 meals, and at bedtime.  also check if you have symptoms of your blood sugar being too high or too low.  please keep a record of the readings and bring it to your next appointment here (or you can bring the meter itself).  You can write it on any piece of paper.  please call us sooner if your blood sugar goes below 70, or if you have a lot of readings over 200. Please continue the same medications for diabetes.  Please come back for a follow-up appointment in 4 months.      

## 2018-09-13 NOTE — Progress Notes (Signed)
Subjective:    Patient ID: [name] White, female    DOB: 03-27-1941, 77 y.o.   MRN: [id number]  HPI Pt returns for f/u of diabetes mellitus: DM type: 2 Dx'ed: 1999, after an episode of pancreatitis (no pancreatitis since then).  Complications: none Therapy: 3 oral meds + victoza.  GDM: never DKA: never Severe hypoglycemia: never.    Other: she has never taken insulin; she cannot take bromocriptine, due to an interaction with maxalt; pioglitizone has caused edema.   Interval history: no cbg record, but states cbg's are well-controlled.  pt states she feels well in general.  Past Medical History:  Diagnosis Date  . Asthma   . ASYMPTOMATIC POSTMENOPAUSAL STATUS 02/09/2009  . Constipation 03/19/2013  . DIABETES MELLITUS, TYPE II 09/14/2007  . HYPERCHOLESTEROLEMIA 02/09/2009  . HYPERTENSION 02/09/2009  . HYPOTHYROIDISM 09/14/2007  . MIGRAINE HEADACHE 09/14/2007  . Neuropathy   . OSTEOPOROSIS 02/09/2009  . PANCREATITIS 09/14/2007  . 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 PONV (postoperative nausea and vomiting)   . Rectocele 03/19/2013  . Shortness of breath   . SUPERFICIAL PHLEBITIS 09/14/2007  . Varicose veins     Past Surgical History:  Procedure Laterality Date  . ABDOMINAL HYSTERECTOMY    . ANTERIOR AND POSTERIOR REPAIR N/A 04/02/2013   Procedure: ANTERIOR (CYSTOCELE) AND POSTERIOR REPAIR (RECTOCELE);  Surgeon: Jonnie Kind, MD;  Location: [facility];  Service: Gynecology;  Laterality: N/A;  . CATARACT EXTRACTION W/PHACO  12/05/2011   Procedure: CATARACT EXTRACTION PHACO AND INTRAOCULAR LENS PLACEMENT (IOC);  Surgeon: Tonny Branch;  Location: [facility];  Service: Ophthalmology;  Laterality: Left;  CDE:9.65  . CHOLECYSTECTOMY    . CYSTOSCOPY    . ENDOVENOUS ABLATION SAPHENOUS VEIN W/ LASER  11-03-2011   right greater saphenous vein   left leg done 10-2011  . EYE SURGERY  98   right cataract extraction 98  . LAPAROSCOPIC NISSEN FUNDOPLICATION    . NM ESOPHAGEAL REFLUX  08-11-11  . PITUITARY  EXCISION  10/1997  . POSTERIOR REPAIR    . TRANSPHENOIDAL / TRANSNASAL HYPOPHYSECTOMY / RESECTION PITUITARY TUMOR  08-11-11    Social History   Socioeconomic History  . Marital status: Married    Spouse name: Not on file  . Number of children: Not on file  . Years of education: Not on file  . Highest education level: Not on file  Occupational History  . Occupation: Retired    Fish farm manager: RETIRED  Social Needs  . Financial resource strain: Not on file  . Food insecurity:    Worry: Not on file    Inability: Not on file  . Transportation needs:    Medical: Not on file    Non-medical: Not on file  Tobacco Use  . Smoking status: Never Smoker  . Smokeless tobacco: Never Used  Substance and Sexual Activity  . Alcohol use: No    Alcohol/week: 0.0 standard drinks  . Drug use: No  . Sexual activity: Yes    Birth control/protection: Surgical  Lifestyle  . Physical activity:    Days per week: Not on file    Minutes per session: Not on file  . Stress: Not on file  Relationships  . Social connections:    Talks on phone: Not on file    Gets together: Not on file    Attends religious service: Not on file    Active member of club or organization: Not on file    Attends meetings of clubs or organizations: Not on file  Relationship status: Not on file  . Intimate partner violence:    Fear of current or ex partner: Not on file    Emotionally abused: Not on file    Physically abused: Not on file    Forced sexual activity: Not on file  Other Topics Concern  . Not on file  Social History Narrative  . Not on file    Current Outpatient Medications on File Prior to Visit  Medication Sig Dispense Refill  . ACCU-CHEK AVIVA PLUS test strip     . albuterol (PROVENTIL HFA;VENTOLIN HFA) 108 (90 BASE) MCG/ACT inhaler Inhale 2 puffs into the lungs every 4 (four) hours as needed.      Marland Kitchen aspirin 81 MG tablet Take 81 mg by mouth daily.    Marland Kitchen atorvastatin (LIPITOR) 40 MG tablet TAKE ONE (1)  TABLET EACH DAY 90 tablet 3  . baclofen (LIORESAL) 10 MG tablet Take 10 mg by mouth daily.    Marland Kitchen BREO ELLIPTA 200-25 MCG/INH AEPB     . carvedilol (COREG) 3.125 MG tablet TAKE ONE TABLET BY MOUTH TWICE A DAY 180 tablet 3  . cycloSPORINE (RESTASIS) 0.05 % ophthalmic emulsion Place 1 drop into both eyes 2 (two) times daily.      Marland Kitchen estradiol (ESTRACE) 0.5 MG tablet TAKE ONE (1) TABLET BY MOUTH EVERY DAY 90 tablet 1  . estradiol (ESTRACE) 1 MG tablet Take 1 tablet (1 mg total) by mouth daily. 90 tablet 2  . furosemide (LASIX) 20 MG tablet May take Lasix 20 mg as NEEDED for leg swelling 45 tablet 3  . Hydrocodone-Acetaminophen 2.5-325 MG TABS Take 5-325 mg by mouth as needed.    . Insulin Pen Needle (NOVOTWIST) 32G X 5 MM MISC Use as directed once a day.   V250.00 100 each 6  . INVOKANA 100 MG TABS tablet TAKE 1/2 TABLET ONCE DAILY 30 tablet 3  . levothyroxine (SYNTHROID, LEVOTHROID) 50 MCG tablet TAKE ONE (1) TABLET EACH DAY 90 tablet 2  . lisinopril (PRINIVIL,ZESTRIL) 5 MG tablet Take 5 mg by mouth every morning.      . metFORMIN (GLUCOPHAGE-XR) 500 MG 24 hr tablet TAKE TWO (2) TABLETS BY MOUTH DAILY 180 tablet 3  . Multiple Vitamins-Minerals (MULTIVITAMINS THER. W/MINERALS) TABS Take 1 tablet by mouth every morning.      Marland Kitchen NOVOFINE 32G X 6 MM MISC USE ONCE DAILY 100 each 2  . omeprazole (PRILOSEC) 20 MG capsule Take 20 mg by mouth every morning.     . pregabalin (LYRICA) 150 MG capsule Take 150 mg by mouth 2 (two) times daily.     . repaglinide (PRANDIN) 1 MG tablet 1 tab with breakfast, 4 with lunch, and 4 with supper 810 tablet 3  . rizatriptan (MAXALT-MLT) 10 MG disintegrating tablet Take 10 mg by mouth daily as needed. May repeat in 2 hours if needed    . venlafaxine (EFFEXOR-XR) 150 MG 24 hr capsule Take 150 mg by mouth every morning.     Marland Kitchen VICTOZA 18 MG/3ML SOPN INJECT 1.8MG  SUBCUTANEOUSLY DAILY 9 mL 1   No current facility-administered medications on file prior to visit.     Allergies   Allergen Reactions  . Betadine [Povidone Iodine] Hives  . Penicillins Rash    Family History  Problem Relation Age of Onset  . Heart disease Mother   . Asthma Mother   . COPD Father   . Heart disease Father   . Asthma Daughter   . Migraines Daughter   .  Neuropathy Son   . Cancer Neg Hx   . Anesthesia problems Neg Hx   . Hypotension Neg Hx   . Malignant hyperthermia Neg Hx   . Pseudochol deficiency Neg Hx     BP 100/62 (BP Location: Left Arm)   Pulse 75   Ht 5\' 6"  (1.676 m)   Wt 169 lb 9.6 oz (76.9 kg)   SpO2 95%   BMI 27.37 kg/m    Review of Systems She denies hypoglycemia.      Objective:   Physical Exam VITAL SIGNS:  See vs page GENERAL: no distress Pulses: dorsalis pedis intact bilat.   MSK: no deformity of the feet CV: trace bilat leg edema, and bilat vv's Skin:  no ulcer on the feet.  normal color and temp on the feet. Neuro: sensation is intact to touch on the feet Ext: There is bilateral onychomycosis of the toenails   Lab Results  Component Value Date   HGBA1C 7.8 (A) 09/13/2018   Lab Results  Component Value Date   TSH 2.753 01/16/2018      Assessment & Plan:  Type 2 DM: worse.  Edema: this limits rx options.   Patient Instructions  check your blood sugar once a day.  vary the time of day when you check, between before the 3 meals, and at bedtime.  also check if you have symptoms of your blood sugar being too high or too low.  please keep a record of the readings and bring it to your next appointment here (or you can bring the meter itself).  You can write it on any piece of paper.  please call us sooner if your blood sugar goes below 70, or if you have a lot of readings over 200. Please continue the same medications for diabetes.  Please come White for a follow-up appointment in 4 months.

## 2018-09-19 ENCOUNTER — Other Ambulatory Visit: Payer: Self-pay | Admitting: Endocrinology

## 2018-10-02 ENCOUNTER — Other Ambulatory Visit: Payer: Self-pay | Admitting: Endocrinology

## 2018-10-03 ENCOUNTER — Other Ambulatory Visit: Payer: Self-pay | Admitting: Endocrinology

## 2018-10-04 ENCOUNTER — Ambulatory Visit: Payer: Medicare Other | Admitting: Cardiology

## 2018-10-04 ENCOUNTER — Encounter: Payer: Self-pay | Admitting: Cardiology

## 2018-10-04 VITALS — BP 106/70 | HR 83 | Ht 66.0 in | Wt 173.0 lb

## 2018-10-04 DIAGNOSIS — R002 Palpitations: Secondary | ICD-10-CM

## 2018-10-04 DIAGNOSIS — E782 Mixed hyperlipidemia: Secondary | ICD-10-CM | POA: Diagnosis not present

## 2018-10-04 DIAGNOSIS — I10 Essential (primary) hypertension: Secondary | ICD-10-CM | POA: Diagnosis not present

## 2018-10-04 DIAGNOSIS — R600 Localized edema: Secondary | ICD-10-CM | POA: Diagnosis not present

## 2018-10-04 NOTE — Progress Notes (Signed)
Clinical Summary [name] White is a 77 y.o.female seen today for follow up of the following medical problems.   1. Palpitations - no recent symptoms  2. HTN - compliant with meds   3. Hyperlipidemia Jan 2019 TC 113 TG 116 HDL 42 LDL 48 - compliant with statin.   4. LE edema - seen by vascular, evidence of bilateral venous reflux - 06/2015 [place] no DVT - echo 06/2014 LVEF 60-65%, grade II diastolic dysfunction.  - denies any recent edema, only has to take lasix rarely.    Past Medical History:  Diagnosis Date  . Asthma   . ASYMPTOMATIC POSTMENOPAUSAL STATUS 02/09/2009  . Constipation 03/19/2013  . DIABETES MELLITUS, TYPE II 09/14/2007  . HYPERCHOLESTEROLEMIA 02/09/2009  . HYPERTENSION 02/09/2009  . HYPOTHYROIDISM 09/14/2007  . MIGRAINE HEADACHE 09/14/2007  . Neuropathy   . OSTEOPOROSIS 02/09/2009  . PANCREATITIS 09/14/2007  . 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 PONV (postoperative nausea and vomiting)   . Rectocele 03/19/2013  . Shortness of breath   . SUPERFICIAL PHLEBITIS 09/14/2007  . Varicose veins      Allergies  Allergen Reactions  . Betadine [Povidone Iodine] Hives  . Penicillins Rash     Current Outpatient Medications  Medication Sig Dispense Refill  . ACCU-CHEK AVIVA PLUS test strip     . albuterol (PROVENTIL HFA;VENTOLIN HFA) 108 (90 BASE) MCG/ACT inhaler Inhale 2 puffs into the lungs every 4 (four) hours as needed.      Marland Kitchen aspirin 81 MG tablet Take 81 mg by mouth daily.    Marland Kitchen atorvastatin (LIPITOR) 40 MG tablet TAKE ONE (1) TABLET EACH DAY 90 tablet 3  . baclofen (LIORESAL) 10 MG tablet Take 10 mg by mouth daily.    Marland Kitchen BREO ELLIPTA 200-25 MCG/INH AEPB     . carvedilol (COREG) 3.125 MG tablet TAKE ONE TABLET BY MOUTH TWICE A DAY 180 tablet 3  . cycloSPORINE (RESTASIS) 0.05 % ophthalmic emulsion Place 1 drop into both eyes 2 (two) times daily.      Marland Kitchen estradiol (ESTRACE) 0.5 MG tablet TAKE ONE (1) TABLET BY MOUTH EVERY DAY 90 tablet 1  . estradiol (ESTRACE) 1 MG  tablet Take 1 tablet (1 mg total) by mouth daily. 90 tablet 2  . furosemide (LASIX) 20 MG tablet May take Lasix 20 mg as NEEDED for leg swelling 45 tablet 3  . Hydrocodone-Acetaminophen 2.5-325 MG TABS Take 5-325 mg by mouth as needed.    . Insulin Pen Needle (NOVOTWIST) 32G X 5 MM MISC Use as directed once a day.   V250.00 100 each 6  . INVOKANA 100 MG TABS tablet TAKE 1/2 TABLET ONCE DAILY 30 tablet 3  . levothyroxine (SYNTHROID, LEVOTHROID) 50 MCG tablet TAKE ONE (1) TABLET BY MOUTH EVERY DAY 90 tablet 2  . lisinopril (PRINIVIL,ZESTRIL) 5 MG tablet Take 5 mg by mouth every morning.      . metFORMIN (GLUCOPHAGE-XR) 500 MG 24 hr tablet TAKE TWO (2) TABLETS BY MOUTH DAILY 180 tablet 3  . Multiple Vitamins-Minerals (MULTIVITAMINS THER. W/MINERALS) TABS Take 1 tablet by mouth every morning.      Marland Kitchen NOVOFINE 32G X 6 MM MISC USE ONCE DAILY 100 each 2  . omeprazole (PRILOSEC) 20 MG capsule Take 20 mg by mouth every morning.     . pregabalin (LYRICA) 150 MG capsule Take 150 mg by mouth 2 (two) times daily.     . repaglinide (PRANDIN) 1 MG tablet TAKE ONE TABLET BY MOUTH WITH BREAKFAST,FOUR TABLETS WITH  LUNCH, AND 4 TABLETS WITH SUPPER 810 tablet 3  . rizatriptan (MAXALT-MLT) 10 MG disintegrating tablet Take 10 mg by mouth daily as needed. May repeat in 2 hours if needed    . venlafaxine (EFFEXOR-XR) 150 MG 24 hr capsule Take 150 mg by mouth every morning.     Marland Kitchen VICTOZA 18 MG/3ML SOPN INJECT 1.8MG SUBCUTANEOUSLY DAILY 9 mL 1   No current facility-administered medications for this visit.      Past Surgical History:  Procedure Laterality Date  . ABDOMINAL HYSTERECTOMY    . ANTERIOR AND POSTERIOR REPAIR N/A 04/02/2013   Procedure: ANTERIOR (CYSTOCELE) AND POSTERIOR REPAIR (RECTOCELE);  Surgeon: Jonnie Kind, MD;  Location: [facility];  Service: Gynecology;  Laterality: N/A;  . CATARACT EXTRACTION W/PHACO  12/05/2011   Procedure: CATARACT EXTRACTION PHACO AND INTRAOCULAR LENS PLACEMENT (IOC);  Surgeon:  Tonny Marlis Oldaker;  Location: [facility];  Service: Ophthalmology;  Laterality: Left;  CDE:9.65  . CHOLECYSTECTOMY    . CYSTOSCOPY    . ENDOVENOUS ABLATION SAPHENOUS VEIN W/ LASER  11-03-2011   right greater saphenous vein   left leg done 10-2011  . EYE SURGERY  98   right cataract extraction 98  . LAPAROSCOPIC NISSEN FUNDOPLICATION    . NM ESOPHAGEAL REFLUX  08-11-11  . PITUITARY EXCISION  10/1997  . POSTERIOR REPAIR    . TRANSPHENOIDAL / TRANSNASAL HYPOPHYSECTOMY / RESECTION PITUITARY TUMOR  08-11-11     Allergies  Allergen Reactions  . Betadine [Povidone Iodine] Hives  . Penicillins Rash      Family History  Problem Relation Age of Onset  . Heart disease Mother   . Asthma Mother   . COPD Father   . Heart disease Father   . Asthma Daughter   . Migraines Daughter   . Neuropathy Son   . Cancer Neg Hx   . Anesthesia problems Neg Hx   . Hypotension Neg Hx   . Malignant hyperthermia Neg Hx   . Pseudochol deficiency Neg Hx      Social History Ms. Miron reports that she has never smoked. She has never used smokeless tobacco. Ms. Bostwick reports that she does not drink alcohol.   Review of Systems CONSTITUTIONAL: No weight loss, fever, chills, weakness or fatigue.  HEENT: Eyes: No visual loss, blurred vision, double vision or yellow sclerae.No hearing loss, sneezing, congestion, runny nose or sore throat.  SKIN: No rash or itching.  CARDIOVASCULAR: per hpi RESPIRATORY: No shortness of breath, cough or sputum.  GASTROINTESTINAL: No anorexia, nausea, vomiting or diarrhea. No abdominal pain or blood.  GENITOURINARY: No burning on urination, no polyuria NEUROLOGICAL: No headache, dizziness, syncope, paralysis, ataxia, numbness or tingling in the extremities. No change in bowel or bladder control.  MUSCULOSKELETAL: No muscle, back pain, joint pain or stiffness.  LYMPHATICS: No enlarged nodes. No history of splenectomy.  PSYCHIATRIC: No history of depression or anxiety.    ENDOCRINOLOGIC: No reports of sweating, cold or heat intolerance. No polyuria or polydipsia.  Marland Kitchen   Physical Examination Vitals:   10/04/18 1256  BP: 106/70  Pulse: 83  SpO2: 95%   Vitals:   10/04/18 1256  Weight: 173 lb (78.5 kg)  Height: _0  (1.676 m)    Gen: resting comfortably, no acute distress HEENT: no scleral icterus, pupils equal round and reactive, no palptable cervical adenopathy,  CV: RRR, no m/r/g, no jvd Resp: Clear to auscultation bilaterally GI: abdomen is soft, non-tender, non-distended, normal bowel sounds, no hepatosplenomegaly MSK: extremities are warm, no  edema.  Skin: warm, no rash Neuro:  no focal deficits Psych: appropriate affect   Diagnostic Studies 06/2014 Echo Study Conclusions  - Left ventricle: The cavity size was normal. Wall thickness was increased in a pattern of mild LVH. Systolic function was normal. The estimated ejection fraction was in the range of 60% to 65%. Wall motion was normal; there were no regional wall motion abnormalities. Features are consistent with a pseudonormal left ventricular filling pattern, with concomitant abnormal relaxation and increased filling pressure (grade 2 diastolic dysfunction). - Aortic valve: There was trivial regurgitation. - Mitral valve: Calcified annulus. There was mild regurgitation. - Left atrium: The atrium was moderately dilated. - Right atrium: Central venous pressure (est): 3 mm Hg. - Tricuspid valve: There was trivial regurgitation. - Pulmonary arteries: Systolic pressure could not be accurately estimated. - Pericardium, extracardiac: There was no pericardial effusion.  Impressions:  - Mild LVH with LVEF 64-33%, grade 2 diastolic dysfunction. Moderate left atrial enlargement. MAC with mild mitral regurgitation. Unable to assess PASP.    Assessment and Plan  1. Palpitations -no recent symptoms, EKG today shows NSR - continue to monitor.   2. HTN - she is at goal, continue  current meds  3. Hyperlipidemia - lipids at goal, continue statin  4. LE edema - no recent troubles, continue prn diuretic   Discussed changes in recommendations about ASA for primary prevention, reported ok to d/c asprin.     F/u 1 year      Arnoldo Lenis, M.D.

## 2018-10-04 NOTE — Patient Instructions (Signed)
Medication Instructions:  Your physician recommends that you continue on your current medications as directed. Please refer to the Current Medication list given to you today.   Labwork: NONE  Testing/Procedures: NONE  Follow-Up: Your physician wants you to follow-up in: 1 YEAR,  You will receive a reminder letter in the mail two months in advance. If you don't receive a letter, please call our office to schedule the follow-up appointment.   Any Other Special Instructions Will Be Listed Below (If Applicable).     If you need a refill on your cardiac medications before your next appointment, please call your pharmacy.   

## 2018-11-05 ENCOUNTER — Other Ambulatory Visit: Payer: Self-pay | Admitting: Endocrinology

## 2018-11-28 ENCOUNTER — Other Ambulatory Visit: Payer: Self-pay

## 2018-11-28 DIAGNOSIS — E119 Type 2 diabetes mellitus without complications: Secondary | ICD-10-CM

## 2018-11-28 MED ORDER — LIRAGLUTIDE 18 MG/3ML ~~LOC~~ SOPN: PEN_INJECTOR | SUBCUTANEOUS | 1 refills | Status: DC

## 2018-11-28 MED ORDER — METFORMIN HCL ER 500 MG PO TB24: ORAL_TABLET | ORAL | 3 refills | Status: DC

## 2018-11-28 NOTE — Telephone Encounter (Signed)
Received notification from [facility] re: pt request to refill Victoza and Metformin. Request authorized and refill sent as requested.

## 2019-01-11 DIAGNOSIS — E119 Type 2 diabetes mellitus without complications: Secondary | ICD-10-CM | POA: Diagnosis not present

## 2019-01-11 DIAGNOSIS — I10 Essential (primary) hypertension: Secondary | ICD-10-CM | POA: Diagnosis not present

## 2019-01-11 DIAGNOSIS — J45909 Unspecified asthma, uncomplicated: Secondary | ICD-10-CM | POA: Diagnosis not present

## 2019-01-11 DIAGNOSIS — E785 Hyperlipidemia, unspecified: Secondary | ICD-10-CM | POA: Diagnosis not present

## 2019-01-14 ENCOUNTER — Ambulatory Visit: Payer: Medicare Other | Admitting: Endocrinology

## 2019-01-29 ENCOUNTER — Ambulatory Visit: Payer: Medicare Other | Admitting: Endocrinology

## 2019-01-29 ENCOUNTER — Encounter: Payer: Self-pay | Admitting: Endocrinology

## 2019-01-29 VITALS — BP 106/70 | HR 91 | Ht 66.0 in | Wt 170.6 lb

## 2019-01-29 DIAGNOSIS — E119 Type 2 diabetes mellitus without complications: Secondary | ICD-10-CM | POA: Diagnosis not present

## 2019-01-29 LAB — POCT GLYCOSYLATED HEMOGLOBIN (HGB A1C): HEMOGLOBIN A1C: 7.4 % — AB (ref 4.0–5.6)

## 2019-01-29 NOTE — Patient Instructions (Signed)
check your blood sugar once a day.  vary the time of day when you check, between before the 3 meals, and at bedtime.  also check if you have symptoms of your blood sugar being too high or too low.  please keep a record of the readings and bring it to your next appointment here (or you can bring the meter itself).  You can write it on any piece of paper.  please call us sooner if your blood sugar goes below 70, or if you have a lot of readings over 200. Please continue the same medications for diabetes.  Please come back for a follow-up appointment in 4 months.

## 2019-01-29 NOTE — Progress Notes (Signed)
Subjective:    Patient ID: [name] White, female    DOB: 1941/05/05, 78 y.o.   MRN: [id number]  HPI Pt returns for f/u of diabetes mellitus: DM type: 2 Dx'ed: 1999, after an episode of pancreatitis (no pancreatitis since then).   Complications: none Therapy: 3 oral meds + victoza.  GDM: never DKA: never Severe hypoglycemia: never.    Pancreatic Imaging (2002): recurrent pancreatitis.  Other: she has never taken insulin; she cannot take bromocriptine, due to an interaction with maxalt; pioglitizone has caused edema.   Interval history: no cbg record, but states cbg's are well-controlled.  pt states she feels well in general.   Past Medical History:  Diagnosis Date  . Asthma   . ASYMPTOMATIC POSTMENOPAUSAL STATUS 02/09/2009  . Constipation 03/19/2013  . DIABETES MELLITUS, TYPE II 09/14/2007  . HYPERCHOLESTEROLEMIA 02/09/2009  . HYPERTENSION 02/09/2009  . HYPOTHYROIDISM 09/14/2007  . MIGRAINE HEADACHE 09/14/2007  . Neuropathy   . OSTEOPOROSIS 02/09/2009  . PANCREATITIS 09/14/2007  . 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 PONV (postoperative nausea and vomiting)   . Rectocele 03/19/2013  . Shortness of breath   . SUPERFICIAL PHLEBITIS 09/14/2007  . Varicose veins     Past Surgical History:  Procedure Laterality Date  . ABDOMINAL HYSTERECTOMY    . ANTERIOR AND POSTERIOR REPAIR N/A 04/02/2013   Procedure: ANTERIOR (CYSTOCELE) AND POSTERIOR REPAIR (RECTOCELE);  Surgeon: Jonnie Kind, MD;  Location: [facility];  Service: Gynecology;  Laterality: N/A;  . CATARACT EXTRACTION W/PHACO  12/05/2011   Procedure: CATARACT EXTRACTION PHACO AND INTRAOCULAR LENS PLACEMENT (IOC);  Surgeon: Tonny Branch;  Location: [facility];  Service: Ophthalmology;  Laterality: Left;  CDE:9.65  . CHOLECYSTECTOMY    . CYSTOSCOPY    . ENDOVENOUS ABLATION SAPHENOUS VEIN W/ LASER  11-03-2011   right greater saphenous vein   left leg done 10-2011  . EYE SURGERY  98   right cataract extraction 98  . LAPAROSCOPIC NISSEN  FUNDOPLICATION    . NM ESOPHAGEAL REFLUX  08-11-11  . PITUITARY EXCISION  10/1997  . POSTERIOR REPAIR    . TRANSPHENOIDAL / TRANSNASAL HYPOPHYSECTOMY / RESECTION PITUITARY TUMOR  08-11-11    Social History   Socioeconomic History  . Marital status: Married    Spouse name: Not on file  . Number of children: Not on file  . Years of education: Not on file  . Highest education level: Not on file  Occupational History  . Occupation: Retired    Fish farm manager: RETIRED  Social Needs  . Financial resource strain: Not on file  . Food insecurity:    Worry: Not on file    Inability: Not on file  . Transportation needs:    Medical: Not on file    Non-medical: Not on file  Tobacco Use  . Smoking status: Never Smoker  . Smokeless tobacco: Never Used  Substance and Sexual Activity  . Alcohol use: No    Alcohol/week: 0.0 standard drinks  . Drug use: No  . Sexual activity: Yes    Birth control/protection: Surgical  Lifestyle  . Physical activity:    Days per week: Not on file    Minutes per session: Not on file  . Stress: Not on file  Relationships  . Social connections:    Talks on phone: Not on file    Gets together: Not on file    Attends religious service: Not on file    Active member of club or organization: Not on file    Attends  meetings of clubs or organizations: Not on file    Relationship status: Not on file  . Intimate partner violence:    Fear of current or ex partner: Not on file    Emotionally abused: Not on file    Physically abused: Not on file    Forced sexual activity: Not on file  Other Topics Concern  . Not on file  Social History Narrative  . Not on file    Current Outpatient Medications on File Prior to Visit  Medication Sig Dispense Refill  . ACCU-CHEK AVIVA PLUS test strip     . albuterol (PROVENTIL HFA;VENTOLIN HFA) 108 (90 BASE) MCG/ACT inhaler Inhale 2 puffs into the lungs every 4 (four) hours as needed.      Marland Kitchen aspirin 81 MG tablet Take 81 mg by  mouth daily.    Marland Kitchen atorvastatin (LIPITOR) 40 MG tablet TAKE ONE (1) TABLET EACH DAY 90 tablet 3  . baclofen (LIORESAL) 10 MG tablet Take 10 mg by mouth daily.    Marland Kitchen BREO ELLIPTA 200-25 MCG/INH AEPB     . carvedilol (COREG) 3.125 MG tablet TAKE ONE TABLET BY MOUTH TWICE A DAY 180 tablet 3  . cycloSPORINE (RESTASIS) 0.05 % ophthalmic emulsion Place 1 drop into both eyes 2 (two) times daily.      Marland Kitchen estradiol (ESTRACE) 1 MG tablet Take 1 tablet (1 mg total) by mouth daily. 90 tablet 2  . furosemide (LASIX) 20 MG tablet May take Lasix 20 mg as NEEDED for leg swelling 45 tablet 3  . Hydrocodone-Acetaminophen 2.5-325 MG TABS Take 5-325 mg by mouth as needed.    . Insulin Pen Needle (NOVOTWIST) 32G X 5 MM MISC Use as directed once a day.   V250.00 100 each 6  . INVOKANA 100 MG TABS tablet TAKE 1/2 TABLET ONCE DAILY 30 tablet 3  . levothyroxine (SYNTHROID, LEVOTHROID) 50 MCG tablet TAKE ONE (1) TABLET BY MOUTH EVERY DAY 90 tablet 2  . liraglutide (VICTOZA) 18 MG/3ML SOPN INJECT 1.8MG  SUBCUTANEOUSLY DAILY 9 mL 1  . lisinopril (PRINIVIL,ZESTRIL) 5 MG tablet Take 5 mg by mouth every morning.      . metFORMIN (GLUCOPHAGE-XR) 500 MG 24 hr tablet TAKE TWO (2) TABLETS BY MOUTH DAILY 180 tablet 3  . Multiple Vitamins-Minerals (MULTIVITAMINS THER. W/MINERALS) TABS Take 1 tablet by mouth every morning.      Marland Kitchen omeprazole (PRILOSEC) 20 MG capsule Take 20 mg by mouth every morning.     . pregabalin (LYRICA) 150 MG capsule Take 150 mg by mouth 2 (two) times daily.     . repaglinide (PRANDIN) 1 MG tablet TAKE ONE TABLET BY MOUTH WITH BREAKFAST,FOUR TABLETS WITH LUNCH, AND 4 TABLETS WITH SUPPER 810 tablet 3  . rizatriptan (MAXALT-MLT) 10 MG disintegrating tablet Take 10 mg by mouth daily as needed. May repeat in 2 hours if needed    . venlafaxine (EFFEXOR-XR) 150 MG 24 hr capsule Take 150 mg by mouth every morning.      No current facility-administered medications on file prior to visit.     Allergies  Allergen  Reactions  . Betadine [Povidone Iodine] Hives  . Penicillins Rash    Family History  Problem Relation Age of Onset  . Heart disease Mother   . Asthma Mother   . COPD Father   . Heart disease Father   . Asthma Daughter   . Migraines Daughter   . Neuropathy Son   . Cancer Neg Hx   . Anesthesia problems Neg  Hx   . Hypotension Neg Hx   . Malignant hyperthermia Neg Hx   . Pseudochol deficiency Neg Hx     BP 106/70 (BP Location: Left Arm, Patient Position: Sitting, Cuff Size: Normal)   Pulse 91   Ht 5\' 6"  (1.676 m)   Wt 170 lb 9.6 oz (77.4 kg)   SpO2 90%   BMI 27.54 kg/m    Review of Systems She denies hypoglycemia    Objective:   Physical Exam VITAL SIGNS:  See vs page GENERAL: no distress Pulses: dorsalis pedis intact bilat.   MSK: no deformity of the feet CV: no leg edema.  bilat vv's Skin:  no ulcer on the feet.  normal color and temp on the feet. Neuro: sensation is intact to touch on the feet Ext: There is bilateral onychomycosis of the toenails.   Lab Results  Component Value Date   CREATININE 0.59 01/16/2018   BUN 12 01/16/2018   NA 138 01/16/2018   K 4.5 01/16/2018   CL 102 01/16/2018   CO2 25 01/16/2018     Lab Results  Component Value Date   HGBA1C 7.4 (A) 01/29/2019       Assessment & Plan:  Type 2 DM: well-controlled Pancreatitis, by hx: I advised pt to reduce victoza to 1.2 mg qd.  Patient Instructions  check your blood sugar once a day.  vary the time of day when you check, between before the 3 meals, and at bedtime.  also check if you have symptoms of your blood sugar being too high or too low.  please keep a record of the readings and bring it to your next appointment here (or you can bring the meter itself).  You can write it on any piece of paper.  please call us sooner if your blood sugar goes below 70, or if you have a lot of readings over 200. Please continue the same medications for diabetes.  Please come White for a follow-up  appointment in 4 months.

## 2019-01-31 ENCOUNTER — Telehealth: Payer: Self-pay | Admitting: Endocrinology

## 2019-01-31 MED ORDER — LIRAGLUTIDE 18 MG/3ML ~~LOC~~ SOPN: 1.2000 mg | PEN_INJECTOR | Freq: Every day | SUBCUTANEOUS | 1 refills | Status: DC

## 2019-01-31 NOTE — Telephone Encounter (Signed)
please contact patient: I have been reviewing your records more.  Although your pancreatitis was many years ago, you should reduce the victoza to 1.2 mg daily.  I'll see you next time.

## 2019-02-01 ENCOUNTER — Telehealth: Payer: Self-pay | Admitting: Endocrinology

## 2019-02-01 ENCOUNTER — Other Ambulatory Visit: Payer: Self-pay | Admitting: Endocrinology

## 2019-02-01 DIAGNOSIS — E119 Type 2 diabetes mellitus without complications: Secondary | ICD-10-CM

## 2019-02-01 NOTE — Telephone Encounter (Signed)
[name] White ph# [phone] called re: Pharmacy requests clarification of RX for Victoza. Please call Pharmacy at the ph# listed above.

## 2019-02-04 NOTE — Telephone Encounter (Signed)
This has been completed.

## 2019-02-04 NOTE — Telephone Encounter (Signed)
Called pharmacy and they stated that reportedly, the after hours nurse provided clarification and stated that previous documentation was made supporting the dosage change.

## 2019-02-20 ENCOUNTER — Other Ambulatory Visit: Payer: Self-pay | Admitting: Obstetrics and Gynecology

## 2019-02-20 NOTE — Telephone Encounter (Signed)
refil estradiol x 180 d

## 2019-05-23 ENCOUNTER — Other Ambulatory Visit: Payer: Self-pay

## 2019-05-23 NOTE — Patient Outreach (Signed)
[facility]) Care Management  05/23/2019  [name] White Apr 15, 1941 [id number]   Medication Adherence call to [name] White HIPPA Compliant Voice message left with a call back number. [name] White is showing past due on Atorvastatin 40 mg under [facility].   [facility] Direct Dial [phone]  Fax [phone] [email or web address]

## 2019-05-28 ENCOUNTER — Ambulatory Visit (INDEPENDENT_AMBULATORY_CARE_PROVIDER_SITE_OTHER): Payer: Medicare Other | Admitting: Endocrinology

## 2019-05-28 ENCOUNTER — Other Ambulatory Visit: Payer: Self-pay

## 2019-05-28 DIAGNOSIS — E119 Type 2 diabetes mellitus without complications: Secondary | ICD-10-CM

## 2019-05-28 MED ORDER — LIRAGLUTIDE 18 MG/3ML ~~LOC~~ SOPN: 1.8000 mg | PEN_INJECTOR | Freq: Every day | SUBCUTANEOUS | 11 refills | Status: DC

## 2019-05-28 NOTE — Patient Instructions (Addendum)
check your blood sugar once a day.  vary the time of day when you check, between before the 3 meals, and at bedtime.  also check if you have symptoms of your blood sugar being too high or too low.  please keep a record of the readings and bring it to your next appointment here (or you can bring the meter itself).  You can write it on any piece of paper.  please call us sooner if your blood sugar goes below 70, or if you have a lot of readings over 200. Please continue the same medications for diabetes.  Please come back for a follow-up appointment in 2 months.

## 2019-05-28 NOTE — Progress Notes (Signed)
Subjective:    Patient ID: [name] White, female    DOB: 03/28/1941, 78 y.o.   MRN: [id number]  HPI telehealth visit today via phone x 6 minutes.   Alternatives to telehealth are presented to this patient, and the patient agrees to the telehealth visit.   Pt is advised of the cost of the visit, and agrees to this, also.   Patient is at home, and I am at the office.   Persons attending the telehealth visit: the patient and I.   Pt returns for f/u of diabetes mellitus: DM type: 2 Dx'ed: 1999, after an episode of pancreatitis (no pancreatitis since then).   Complications: none Therapy: 3 oral meds + victoza.  GDM: never DKA: never Severe hypoglycemia: never.    Pancreatic Imaging (2002): recurrent pancreatitis.  Other: she has never taken insulin; she cannot take bromocriptine, due to an interaction with maxalt; pioglitizone has caused edema.   Interval history: no cbg record, but states cbg's vary from 125-189.  pt states she feels well in general.   Past Medical History:  Diagnosis Date  . Asthma   . ASYMPTOMATIC POSTMENOPAUSAL STATUS 02/09/2009  . Constipation 03/19/2013  . DIABETES MELLITUS, TYPE II 09/14/2007  . HYPERCHOLESTEROLEMIA 02/09/2009  . HYPERTENSION 02/09/2009  . HYPOTHYROIDISM 09/14/2007  . MIGRAINE HEADACHE 09/14/2007  . Neuropathy   . OSTEOPOROSIS 02/09/2009  . PANCREATITIS 09/14/2007  . 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 PONV (postoperative nausea and vomiting)   . Rectocele 03/19/2013  . Shortness of breath   . SUPERFICIAL PHLEBITIS 09/14/2007  . Varicose veins     Past Surgical History:  Procedure Laterality Date  . ABDOMINAL HYSTERECTOMY    . ANTERIOR AND POSTERIOR REPAIR N/A 04/02/2013   Procedure: ANTERIOR (CYSTOCELE) AND POSTERIOR REPAIR (RECTOCELE);  Surgeon: Jonnie Kind, MD;  Location: [facility];  Service: Gynecology;  Laterality: N/A;  . CATARACT EXTRACTION W/PHACO  12/05/2011   Procedure: CATARACT EXTRACTION PHACO AND INTRAOCULAR LENS PLACEMENT (IOC);   Surgeon: Tonny Branch;  Location: [facility];  Service: Ophthalmology;  Laterality: Left;  CDE:9.65  . CHOLECYSTECTOMY    . CYSTOSCOPY    . ENDOVENOUS ABLATION SAPHENOUS VEIN W/ LASER  11-03-2011   right greater saphenous vein   left leg done 10-2011  . EYE SURGERY  98   right cataract extraction 98  . LAPAROSCOPIC NISSEN FUNDOPLICATION    . NM ESOPHAGEAL REFLUX  08-11-11  . PITUITARY EXCISION  10/1997  . POSTERIOR REPAIR    . TRANSPHENOIDAL / TRANSNASAL HYPOPHYSECTOMY / RESECTION PITUITARY TUMOR  08-11-11    Social History   Socioeconomic History  . Marital status: Married    Spouse name: Not on file  . Number of children: Not on file  . Years of education: Not on file  . Highest education level: Not on file  Occupational History  . Occupation: Retired    Fish farm manager: RETIRED  Social Needs  . Financial resource strain: Not on file  . Food insecurity:    Worry: Not on file    Inability: Not on file  . Transportation needs:    Medical: Not on file    Non-medical: Not on file  Tobacco Use  . Smoking status: Never Smoker  . Smokeless tobacco: Never Used  Substance and Sexual Activity  . Alcohol use: No    Alcohol/week: 0.0 standard drinks  . Drug use: No  . Sexual activity: Yes    Birth control/protection: Surgical  Lifestyle  . Physical activity:    Days  per week: Not on file    Minutes per session: Not on file  . Stress: Not on file  Relationships  . Social connections:    Talks on phone: Not on file    Gets together: Not on file    Attends religious service: Not on file    Active member of club or organization: Not on file    Attends meetings of clubs or organizations: Not on file    Relationship status: Not on file  . Intimate partner violence:    Fear of current or ex partner: Not on file    Emotionally abused: Not on file    Physically abused: Not on file    Forced sexual activity: Not on file  Other Topics Concern  . Not on file  Social History Narrative  .  Not on file    Current Outpatient Medications on File Prior to Visit  Medication Sig Dispense Refill  . ACCU-CHEK AVIVA PLUS test strip     . albuterol (PROVENTIL HFA;VENTOLIN HFA) 108 (90 BASE) MCG/ACT inhaler Inhale 2 puffs into the lungs every 4 (four) hours as needed.      Marland Kitchen aspirin 81 MG tablet Take 81 mg by mouth daily.    Marland Kitchen atorvastatin (LIPITOR) 40 MG tablet TAKE ONE (1) TABLET EACH DAY 90 tablet 3  . baclofen (LIORESAL) 10 MG tablet Take 10 mg by mouth daily.    Marland Kitchen BREO ELLIPTA 200-25 MCG/INH AEPB     . cycloSPORINE (RESTASIS) 0.05 % ophthalmic emulsion Place 1 drop into both eyes 2 (two) times daily.      Marland Kitchen estradiol (ESTRACE) 1 MG tablet TAKE ONE TABLET (1MG  TOTAL) BY MOUTH DAILY 90 tablet 1  . furosemide (LASIX) 20 MG tablet May take Lasix 20 mg as NEEDED for leg swelling 45 tablet 3  . Hydrocodone-Acetaminophen 2.5-325 MG TABS Take 5-325 mg by mouth as needed.    . Insulin Pen Needle (NOVOTWIST) 32G X 5 MM MISC Use as directed once a day.   V250.00 100 each 6  . levothyroxine (SYNTHROID, LEVOTHROID) 50 MCG tablet TAKE ONE (1) TABLET BY MOUTH EVERY DAY 90 tablet 2  . lisinopril (PRINIVIL,ZESTRIL) 5 MG tablet Take 5 mg by mouth every morning.      . metFORMIN (GLUCOPHAGE-XR) 500 MG 24 hr tablet TAKE TWO (2) TABLETS BY MOUTH DAILY 180 tablet 3  . Multiple Vitamins-Minerals (MULTIVITAMINS THER. W/MINERALS) TABS Take 1 tablet by mouth every morning.      Marland Kitchen omeprazole (PRILOSEC) 20 MG capsule Take 20 mg by mouth every morning.     . pregabalin (LYRICA) 150 MG capsule Take 150 mg by mouth 2 (two) times daily.     . repaglinide (PRANDIN) 1 MG tablet TAKE ONE TABLET BY MOUTH WITH BREAKFAST,FOUR TABLETS WITH LUNCH, AND 4 TABLETS WITH SUPPER 810 tablet 3  . rizatriptan (MAXALT-MLT) 10 MG disintegrating tablet Take 10 mg by mouth daily as needed. May repeat in 2 hours if needed    . venlafaxine (EFFEXOR-XR) 150 MG 24 hr capsule Take 150 mg by mouth every morning.      No current  facility-administered medications on file prior to visit.     Allergies  Allergen Reactions  . Betadine [Povidone Iodine] Hives  . Penicillins Rash    Family History  Problem Relation Age of Onset  . Heart disease Mother   . Asthma Mother   . COPD Father   . Heart disease Father   . Asthma Daughter   .  Migraines Daughter   . Neuropathy Son   . Cancer Neg Hx   . Anesthesia problems Neg Hx   . Hypotension Neg Hx   . Malignant hyperthermia Neg Hx   . Pseudochol deficiency Neg Hx    Review of Systems She denies hypoglycemia    Objective:   Physical Exam    Lab Results  Component Value Date   HGBA1C 7.4 (A) 01/29/2019      Assessment & Plan:  Type 2 DM: apparently well-controlled. she declines a1c now.    Patient Instructions  check your blood sugar once a day.  vary the time of day when you check, between before the 3 meals, and at bedtime.  also check if you have symptoms of your blood sugar being too high or too low.  please keep a record of the readings and bring it to your next appointment here (or you can bring the meter itself).  You can write it on any piece of paper.  please call us sooner if your blood sugar goes below 70, or if you have a lot of readings over 200. Please continue the same medications for diabetes.  Please come [facility] for a follow-up appointment in 2 months.

## 2019-05-29 ENCOUNTER — Other Ambulatory Visit: Payer: Self-pay | Admitting: Endocrinology

## 2019-05-29 ENCOUNTER — Other Ambulatory Visit: Payer: Self-pay

## 2019-05-29 ENCOUNTER — Other Ambulatory Visit: Payer: Self-pay | Admitting: Cardiology

## 2019-05-29 DIAGNOSIS — E119 Type 2 diabetes mellitus without complications: Secondary | ICD-10-CM

## 2019-05-29 MED ORDER — LIRAGLUTIDE 18 MG/3ML ~~LOC~~ SOPN: 1.2000 mg | PEN_INJECTOR | Freq: Every day | SUBCUTANEOUS | 11 refills | Status: DC

## 2019-05-29 MED ORDER — LIRAGLUTIDE 18 MG/3ML ~~LOC~~ SOPN: 1.8000 mg | PEN_INJECTOR | Freq: Every day | SUBCUTANEOUS | 11 refills | Status: DC

## 2019-06-12 ENCOUNTER — Other Ambulatory Visit: Payer: Self-pay | Admitting: Endocrinology

## 2019-06-12 ENCOUNTER — Telehealth: Payer: Self-pay

## 2019-06-12 NOTE — Telephone Encounter (Signed)
LOV 05/28/19. Per Dr. Loanne Drilling, f/u in 2 mo. Called pt to schedule appt. LVM requesting returned call.

## 2019-07-10 ENCOUNTER — Other Ambulatory Visit: Payer: Self-pay | Admitting: Endocrinology

## 2019-07-12 DIAGNOSIS — Z0000 Encounter for general adult medical examination without abnormal findings: Secondary | ICD-10-CM | POA: Diagnosis not present

## 2019-07-17 DIAGNOSIS — I83813 Varicose veins of bilateral lower extremities with pain: Secondary | ICD-10-CM

## 2019-07-22 ENCOUNTER — Other Ambulatory Visit: Payer: Self-pay

## 2019-07-22 ENCOUNTER — Other Ambulatory Visit (HOSPITAL_COMMUNITY)
Admission: RE | Admit: 2019-07-22 | Discharge: 2019-07-22 | Disposition: A | Discharge status: Home / Self Care | Payer: Medicare Other | Source: Ambulatory Visit | Attending: Pulmonary Disease | Admitting: Pulmonary Disease

## 2019-07-22 DIAGNOSIS — E785 Hyperlipidemia, unspecified: Secondary | ICD-10-CM | POA: Insufficient documentation

## 2019-07-22 DIAGNOSIS — E119 Type 2 diabetes mellitus without complications: Secondary | ICD-10-CM | POA: Diagnosis not present

## 2019-07-22 DIAGNOSIS — I10 Essential (primary) hypertension: Secondary | ICD-10-CM | POA: Insufficient documentation

## 2019-07-22 LAB — LIPID PANEL
Cholesterol: 123 mg/dL (ref 0–200)
HDL: 47 mg/dL (ref >40)
LDL Cholesterol: 56 mg/dL (ref 0–99)
Total CHOL/HDL Ratio: 2.6 RATIO
Triglycerides: 100 mg/dL (ref <150)
VLDL: 20 mg/dL (ref 0–40)

## 2019-07-22 LAB — COMPREHENSIVE METABOLIC PANEL
ALT: 22 U/L (ref 0–44)
AST: 19 U/L (ref 15–41)
Albumin: 3.5 g/dL (ref 3.5–5.0)
Alkaline Phosphatase: 27 U/L — ABNORMAL LOW (ref 38–126)
Anion gap: 11 (ref 5–15)
BUN: 13 mg/dL (ref 8–23)
CO2: 24 mmol/L (ref 22–32)
Calcium: 9 mg/dL (ref 8.9–10.3)
Chloride: 103 mmol/L (ref 98–111)
Creatinine, Ser: 0.66 mg/dL (ref 0.44–1.00)
GFR calc Af Amer: >60 mL/min (ref >60)
GFR calc non Af Amer: >60 mL/min (ref >60)
Glucose, Bld: 168 mg/dL — ABNORMAL HIGH (ref 70–99)
Potassium: 4.3 mmol/L (ref 3.5–5.1)
Sodium: 138 mmol/L (ref 135–145)
Total Bilirubin: 1.2 mg/dL (ref 0.3–1.2)
Total Protein: 6.6 g/dL (ref 6.5–8.1)

## 2019-07-22 LAB — HEMOGLOBIN A1C
Hgb A1c MFr Bld: 8.9 % — ABNORMAL HIGH (ref 4.8–5.6)
Mean Plasma Glucose: 208.73 mg/dL

## 2019-07-22 LAB — TSH: TSH: 2.084 u[IU]/mL (ref 0.350–4.500)

## 2019-07-23 LAB — MICROALBUMIN, URINE: Microalb, Ur: 17 ug/mL — ABNORMAL HIGH

## 2019-08-14 ENCOUNTER — Other Ambulatory Visit: Payer: Self-pay

## 2019-08-14 ENCOUNTER — Other Ambulatory Visit: Payer: Self-pay | Admitting: Endocrinology

## 2019-08-14 ENCOUNTER — Other Ambulatory Visit: Payer: Self-pay | Admitting: Cardiology

## 2019-08-16 ENCOUNTER — Other Ambulatory Visit: Payer: Self-pay

## 2019-08-16 ENCOUNTER — Encounter: Payer: Self-pay | Admitting: Endocrinology

## 2019-08-16 ENCOUNTER — Ambulatory Visit: Payer: Medicare Other | Admitting: Endocrinology

## 2019-08-16 VITALS — BP 100/48 | HR 97 | Ht 66.0 in | Wt 168.6 lb

## 2019-08-16 DIAGNOSIS — Z8719 Personal history of other diseases of the digestive system: Secondary | ICD-10-CM | POA: Diagnosis not present

## 2019-08-16 DIAGNOSIS — Z23 Encounter for immunization: Secondary | ICD-10-CM

## 2019-08-16 DIAGNOSIS — E119 Type 2 diabetes mellitus without complications: Secondary | ICD-10-CM | POA: Diagnosis not present

## 2019-08-16 DIAGNOSIS — Z794 Long term (current) use of insulin: Secondary | ICD-10-CM | POA: Diagnosis not present

## 2019-08-16 NOTE — Progress Notes (Signed)
Subjective:    Patient ID: [name] White, female    DOB: 05-18-1941, 78 y.o.   MRN: [id number]  HPI Pt returns for f/u of diabetes mellitus: DM type: 2 Dx'ed: 1999, after an episode of pancreatitis (no pancreatitis since then).   Complications: none Therapy: 3 oral meds + victoza.  GDM: never DKA: never Severe hypoglycemia: never.    Pancreatic Imaging (2002): recurrent pancreatitis.  Other: she has never taken insulin; she cannot take bromocriptine, due to an interaction with maxalt; pioglitizone has caused edema.   Interval history: no cbg record, but states cbg's vary from 113-190.  pt states she feels well in general.   Past Medical History:  Diagnosis Date  . Asthma   . ASYMPTOMATIC POSTMENOPAUSAL STATUS 02/09/2009  . Constipation 03/19/2013  . DIABETES MELLITUS, TYPE II 09/14/2007  . HYPERCHOLESTEROLEMIA 02/09/2009  . HYPERTENSION 02/09/2009  . HYPOTHYROIDISM 09/14/2007  . MIGRAINE HEADACHE 09/14/2007  . Neuropathy   . OSTEOPOROSIS 02/09/2009  . PANCREATITIS 09/14/2007  . 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 PONV (postoperative nausea and vomiting)   . Rectocele 03/19/2013  . Shortness of breath   . SUPERFICIAL PHLEBITIS 09/14/2007  . Varicose veins     Past Surgical History:  Procedure Laterality Date  . ABDOMINAL HYSTERECTOMY    . ANTERIOR AND POSTERIOR REPAIR N/A 04/02/2013   Procedure: ANTERIOR (CYSTOCELE) AND POSTERIOR REPAIR (RECTOCELE);  Surgeon: Jonnie Kind, MD;  Location: [facility];  Service: Gynecology;  Laterality: N/A;  . CATARACT EXTRACTION W/PHACO  12/05/2011   Procedure: CATARACT EXTRACTION PHACO AND INTRAOCULAR LENS PLACEMENT (IOC);  Surgeon: Tonny Branch;  Location: [facility];  Service: Ophthalmology;  Laterality: Left;  CDE:9.65  . CHOLECYSTECTOMY    . CYSTOSCOPY    . ENDOVENOUS ABLATION SAPHENOUS VEIN W/ LASER  11-03-2011   right greater saphenous vein   left leg done 10-2011  . EYE SURGERY  98   right cataract extraction 98  . LAPAROSCOPIC NISSEN FUNDOPLICATION     . NM ESOPHAGEAL REFLUX  08-11-11  . PITUITARY EXCISION  10/1997  . POSTERIOR REPAIR    . TRANSPHENOIDAL / TRANSNASAL HYPOPHYSECTOMY / RESECTION PITUITARY TUMOR  08-11-11    Social History   Socioeconomic History  . Marital status: Married    Spouse name: Not on file  . Number of children: Not on file  . Years of education: Not on file  . Highest education level: Not on file  Occupational History  . Occupation: Retired    Fish farm manager: RETIRED  Social Needs  . Financial resource strain: Not on file  . Food insecurity    Worry: Not on file    Inability: Not on file  . Transportation needs    Medical: Not on file    Non-medical: Not on file  Tobacco Use  . Smoking status: Never Smoker  . Smokeless tobacco: Never Used  Substance and Sexual Activity  . Alcohol use: No    Alcohol/week: 0.0 standard drinks  . Drug use: No  . Sexual activity: Yes    Birth control/protection: Surgical  Lifestyle  . Physical activity    Days per week: Not on file    Minutes per session: Not on file  . Stress: Not on file  Relationships  . Social Herbalist on phone: Not on file    Gets together: Not on file    Attends religious service: Not on file    Active member of club or organization: Not on file  Attends meetings of clubs or organizations: Not on file    Relationship status: Not on file  . Intimate partner violence    Fear of current or ex partner: Not on file    Emotionally abused: Not on file    Physically abused: Not on file    Forced sexual activity: Not on file  Other Topics Concern  . Not on file  Social History Narrative  . Not on file    Current Outpatient Medications on File Prior to Visit  Medication Sig Dispense Refill  . ACCU-CHEK AVIVA PLUS test strip     . albuterol (PROVENTIL HFA;VENTOLIN HFA) 108 (90 BASE) MCG/ACT inhaler Inhale 2 puffs into the lungs every 4 (four) hours as needed.      Marland Kitchen aspirin 81 MG tablet Take 81 mg by mouth daily.    Marland Kitchen  atorvastatin (LIPITOR) 40 MG tablet TAKE ONE (1) TABLET BY MOUTH EVERY DAY 90 tablet 3  . baclofen (LIORESAL) 10 MG tablet Take 10 mg by mouth daily.    Marland Kitchen BREO ELLIPTA 200-25 MCG/INH AEPB     . carvedilol (COREG) 3.125 MG tablet TAKE ONE TABLET BY MOUTH TWICE A DAY 180 tablet 3  . cycloSPORINE (RESTASIS) 0.05 % ophthalmic emulsion Place 1 drop into both eyes 2 (two) times daily.      Marland Kitchen estradiol (ESTRACE) 1 MG tablet TAKE ONE TABLET (1MG  TOTAL) BY MOUTH DAILY 90 tablet 1  . furosemide (LASIX) 20 MG tablet May take Lasix 20 mg as NEEDED for leg swelling 45 tablet 3  . Hydrocodone-Acetaminophen 2.5-325 MG TABS Take 5-325 mg by mouth as needed.    . Insulin Pen Needle (NOVOTWIST) 32G X 5 MM MISC Use as directed once a day.   V250.00 100 each 6  . INVOKANA 100 MG TABS tablet TAKE 1/2 TABLET BY MOUTH ONCE DAILY 30 tablet 3  . levothyroxine (SYNTHROID) 50 MCG tablet TAKE ONE (1) TABLET BY MOUTH EVERY DAY 30 tablet 0  . liraglutide (VICTOZA) 18 MG/3ML SOPN Inject 0.2 mLs (1.2 mg total) into the skin daily. (Patient taking differently: Inject 1.8 mg into the skin daily. ) 9 mL 11  . lisinopril (PRINIVIL,ZESTRIL) 5 MG tablet Take 5 mg by mouth every morning.      . metFORMIN (GLUCOPHAGE-XR) 500 MG 24 hr tablet TAKE TWO (2) TABLETS BY MOUTH DAILY 180 tablet 3  . Multiple Vitamins-Minerals (MULTIVITAMINS THER. W/MINERALS) TABS Take 1 tablet by mouth every morning.      Marland Kitchen omeprazole (PRILOSEC) 20 MG capsule Take 20 mg by mouth every morning.     . pregabalin (LYRICA) 150 MG capsule Take 150 mg by mouth 2 (two) times daily.     . repaglinide (PRANDIN) 1 MG tablet TAKE ONE TABLET BY MOUTH WITH BREAKFAST,FOUR TABLETS WITH LUNCH, AND 4 TABLETS WITH SUPPER 810 tablet 3  . rizatriptan (MAXALT-MLT) 10 MG disintegrating tablet Take 10 mg by mouth daily as needed. May repeat in 2 hours if needed    . venlafaxine (EFFEXOR-XR) 150 MG 24 hr capsule Take 150 mg by mouth every morning.      No current  facility-administered medications on file prior to visit.     Allergies  Allergen Reactions  . Betadine [Povidone Iodine] Hives  . Penicillins Rash    Family History  Problem Relation Age of Onset  . Heart disease Mother   . Asthma Mother   . COPD Father   . Heart disease Father   . Asthma Daughter   .  Migraines Daughter   . Neuropathy Son   . Cancer Neg Hx   . Anesthesia problems Neg Hx   . Hypotension Neg Hx   . Malignant hyperthermia Neg Hx   . Pseudochol deficiency Neg Hx     BP (!) 100/48 (BP Location: Left Arm, Patient Position: Sitting, Cuff Size: Normal)   Pulse 97   Ht 5\' 6"  (1.676 m)   Wt 168 lb 9.6 oz (76.5 kg)   SpO2 94%   BMI 27.21 kg/m    Review of Systems Pt denies hypoglycemia.      Objective:   Physical Exam VITAL SIGNS:  See vs page GENERAL: no distress Pulses: dorsalis pedis intact bilat.   MSK: no deformity of the feet CV: no leg edema, but there are bilat vv's Skin:  no ulcer on the feet.  normal color and temp on the feet. Neuro: sensation is intact to touch on the feet.    Lab Results  Component Value Date   HGBA1C 8.9 (H) 07/22/2019   Lab Results  Component Value Date   CREATININE 0.66 07/22/2019   BUN 13 07/22/2019   NA 138 07/22/2019   K 4.3 07/22/2019   CL 103 07/22/2019   CO2 24 07/22/2019       Assessment & Plan:  Insulin-requiring type 2 DM: worse Pancreatitis: as it has been many years since this happened, we'll continue victoza.    Patient Instructions  check your blood sugar once a day.  vary the time of day when you check, between before the 3 meals, and at bedtime.  also check if you have symptoms of your blood sugar being too high or too low.  please keep a record of the readings and bring it to your next appointment here (or you can bring the meter itself).  You can write it on any piece of paper.  please call us sooner if your blood sugar goes below 70, or if you have a lot of readings over 200. A different type  of diabetes blood test is requested for you today.  We'll let you know about the result. If it is good, please continue the same medications, and come White for a follow-up appointment in 2 months.  If it is high, you should consider taking insulin.

## 2019-08-16 NOTE — Patient Instructions (Addendum)
check your blood sugar once a day.  vary the time of day when you check, between before the 3 meals, and at bedtime.  also check if you have symptoms of your blood sugar being too high or too low.  please keep a record of the readings and bring it to your next appointment here (or you can bring the meter itself).  You can write it on any piece of paper.  please call us sooner if your blood sugar goes below 70, or if you have a lot of readings over 200. A different type of diabetes blood test is requested for you today.  We'll let you know about the result. If it is good, please continue the same medications, and come back for a follow-up appointment in 2 months.  If it is high, you should consider taking insulin.

## 2019-08-19 LAB — FRUCTOSAMINE: Fructosamine: 291 umol/L — ABNORMAL HIGH (ref 205–285)

## 2019-08-28 ENCOUNTER — Other Ambulatory Visit: Payer: Self-pay | Admitting: Obstetrics and Gynecology

## 2019-09-11 ENCOUNTER — Telehealth: Payer: Self-pay | Admitting: Endocrinology

## 2019-09-11 ENCOUNTER — Other Ambulatory Visit: Payer: Self-pay | Admitting: Endocrinology

## 2019-09-11 ENCOUNTER — Other Ambulatory Visit: Payer: Self-pay

## 2019-09-11 MED ORDER — LEVOTHYROXINE SODIUM 50 MCG PO TABS: 50.0000 ug | ORAL_TABLET | Freq: Every day | ORAL | 2 refills | Status: DC

## 2019-09-11 NOTE — Telephone Encounter (Signed)
levothyroxine (SYNTHROID) 50 MCG tablet 90 tablet 2 09/11/2019    Sig - Route: Take 1 tablet (50 mcg total) by mouth daily before breakfast. - Oral   Sent to pharmacy as: levothyroxine (SYNTHROID) 50 MCG tablet   Notes to Pharmacy: PATIENT REQUESTING 32 DAY SUPPLY   E-Prescribing Status: Receipt confirmed by pharmacy (09/11/2019 10:24 AM EDT)

## 2019-09-11 NOTE — Telephone Encounter (Signed)
Patients pharmacy called requesting we fill levothyroxine (SYNTHROID) 50 MCG tablet for a 90 day supply.  Please Advise, Thanks

## 2019-10-02 ENCOUNTER — Other Ambulatory Visit: Payer: Self-pay | Admitting: Endocrinology

## 2019-10-17 ENCOUNTER — Encounter: Payer: Self-pay | Admitting: Endocrinology

## 2019-10-17 ENCOUNTER — Other Ambulatory Visit: Payer: Self-pay

## 2019-10-17 ENCOUNTER — Ambulatory Visit (INDEPENDENT_AMBULATORY_CARE_PROVIDER_SITE_OTHER): Payer: Medicare Other | Admitting: Endocrinology

## 2019-10-17 VITALS — BP 114/60 | HR 77 | Ht 66.0 in | Wt 169.2 lb

## 2019-10-17 DIAGNOSIS — E119 Type 2 diabetes mellitus without complications: Secondary | ICD-10-CM | POA: Diagnosis not present

## 2019-10-17 DIAGNOSIS — R609 Edema, unspecified: Secondary | ICD-10-CM | POA: Diagnosis not present

## 2019-10-17 LAB — POCT GLYCOSYLATED HEMOGLOBIN (HGB A1C): Hemoglobin A1C: 8.3 % — AB (ref 4.0–5.6)

## 2019-10-17 NOTE — Patient Instructions (Addendum)
check your blood sugar once a day.  vary the time of day when you check, between before the 3 meals, and at bedtime.  also check if you have symptoms of your blood sugar being too high or too low.  please keep a record of the readings and bring it to your next appointment here (or you can bring the meter itself).  You can write it on any piece of paper.  please call us sooner if your blood sugar goes below 70, or if you have a lot of readings over 200. A different type of diabetes blood test is requested for you today.  We'll let you know about the result. If it is good, please continue the same medications, and come back for a follow-up appointment in 3 months.  If it is high, you should consider taking insulin.

## 2019-10-17 NOTE — Progress Notes (Signed)
Subjective:    Patient ID: [name] White, female    DOB: 1941-09-22, 78 y.o.   MRN: [id number]  HPI Pt returns for f/u of diabetes mellitus: DM type: 2 Dx'ed: 1999, after an episode of pancreatitis (no pancreatitis since then).   Complications: none Therapy: 3 oral meds + victoza.  GDM: never DKA: never Severe hypoglycemia: never.    Pancreatic Imaging (2002): recurrent pancreatitis.  Other: she has never taken insulin; she cannot take bromocriptine, due to an interaction with maxalt; pioglitizone has caused edema.   Interval history: no cbg record, but states cbg varies from 112-170.   Past Medical History:  Diagnosis Date  . Asthma   . ASYMPTOMATIC POSTMENOPAUSAL STATUS 02/09/2009  . Constipation 03/19/2013  . DIABETES MELLITUS, TYPE II 09/14/2007  . HYPERCHOLESTEROLEMIA 02/09/2009  . HYPERTENSION 02/09/2009  . HYPOTHYROIDISM 09/14/2007  . MIGRAINE HEADACHE 09/14/2007  . Neuropathy   . OSTEOPOROSIS 02/09/2009  . PANCREATITIS 09/14/2007  . 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 PONV (postoperative nausea and vomiting)   . Rectocele 03/19/2013  . Shortness of breath   . SUPERFICIAL PHLEBITIS 09/14/2007  . Varicose veins     Past Surgical History:  Procedure Laterality Date  . ABDOMINAL HYSTERECTOMY    . ANTERIOR AND POSTERIOR REPAIR N/A 04/02/2013   Procedure: ANTERIOR (CYSTOCELE) AND POSTERIOR REPAIR (RECTOCELE);  Surgeon: Jonnie Kind, MD;  Location: [facility];  Service: Gynecology;  Laterality: N/A;  . CATARACT EXTRACTION W/PHACO  12/05/2011   Procedure: CATARACT EXTRACTION PHACO AND INTRAOCULAR LENS PLACEMENT (IOC);  Surgeon: Tonny Branch;  Location: [facility];  Service: Ophthalmology;  Laterality: Left;  CDE:9.65  . CHOLECYSTECTOMY    . CYSTOSCOPY    . ENDOVENOUS ABLATION SAPHENOUS VEIN W/ LASER  11-03-2011   right greater saphenous vein   left leg done 10-2011  . EYE SURGERY  98   right cataract extraction 98  . LAPAROSCOPIC NISSEN FUNDOPLICATION    . NM ESOPHAGEAL REFLUX  08-11-11   . PITUITARY EXCISION  10/1997  . POSTERIOR REPAIR    . TRANSPHENOIDAL / TRANSNASAL HYPOPHYSECTOMY / RESECTION PITUITARY TUMOR  08-11-11    Social History   Socioeconomic History  . Marital status: Married    Spouse name: Not on file  . Number of children: Not on file  . Years of education: Not on file  . Highest education level: Not on file  Occupational History  . Occupation: Retired    Fish farm manager: RETIRED  Social Needs  . Financial resource strain: Not on file  . Food insecurity    Worry: Not on file    Inability: Not on file  . Transportation needs    Medical: Not on file    Non-medical: Not on file  Tobacco Use  . Smoking status: Never Smoker  . Smokeless tobacco: Never Used  Substance and Sexual Activity  . Alcohol use: No    Alcohol/week: 0.0 standard drinks  . Drug use: No  . Sexual activity: Yes    Birth control/protection: Surgical  Lifestyle  . Physical activity    Days per week: Not on file    Minutes per session: Not on file  . Stress: Not on file  Relationships  . Social Herbalist on phone: Not on file    Gets together: Not on file    Attends religious service: Not on file    Active member of club or organization: Not on file    Attends meetings of clubs or organizations: Not on  file    Relationship status: Not on file  . Intimate partner violence    Fear of current or ex partner: Not on file    Emotionally abused: Not on file    Physically abused: Not on file    Forced sexual activity: Not on file  Other Topics Concern  . Not on file  Social History Narrative  . Not on file    Current Outpatient Medications on File Prior to Visit  Medication Sig Dispense Refill  . ACCU-CHEK AVIVA PLUS test strip     . albuterol (PROVENTIL HFA;VENTOLIN HFA) 108 (90 BASE) MCG/ACT inhaler Inhale 2 puffs into the lungs every 4 (four) hours as needed.      Marland Kitchen aspirin 81 MG tablet Take 81 mg by mouth daily.    Marland Kitchen atorvastatin (LIPITOR) 40 MG tablet TAKE  ONE (1) TABLET BY MOUTH EVERY DAY 90 tablet 3  . baclofen (LIORESAL) 10 MG tablet Take 10 mg by mouth daily.    Marland Kitchen BREO ELLIPTA 200-25 MCG/INH AEPB     . carvedilol (COREG) 3.125 MG tablet TAKE ONE TABLET BY MOUTH TWICE A DAY 180 tablet 3  . cycloSPORINE (RESTASIS) 0.05 % ophthalmic emulsion Place 1 drop into both eyes 2 (two) times daily.      Marland Kitchen estradiol (ESTRACE) 1 MG tablet TAKE ONE TABLET (1MG  TOTAL) BY MOUTH DAILY 90 tablet 1  . furosemide (LASIX) 20 MG tablet May take Lasix 20 mg as NEEDED for leg swelling 45 tablet 3  . Hydrocodone-Acetaminophen 2.5-325 MG TABS Take 5-325 mg by mouth as needed.    . Insulin Pen Needle (NOVOTWIST) 32G X 5 MM MISC Use as directed once a day.   V250.00 100 each 6  . INVOKANA 100 MG TABS tablet TAKE 1/2 TABLET BY MOUTH ONCE DAILY 30 tablet 3  . levothyroxine (SYNTHROID) 50 MCG tablet Take 1 tablet (50 mcg total) by mouth daily before breakfast. 90 tablet 2  . liraglutide (VICTOZA) 18 MG/3ML SOPN Inject 1.8 mg into the skin daily.    Marland Kitchen lisinopril (PRINIVIL,ZESTRIL) 5 MG tablet Take 5 mg by mouth every morning.      . metFORMIN (GLUCOPHAGE-XR) 500 MG 24 hr tablet TAKE TWO (2) TABLETS BY MOUTH DAILY 180 tablet 3  . Multiple Vitamins-Minerals (MULTIVITAMINS THER. W/MINERALS) TABS Take 1 tablet by mouth every morning.      Marland Kitchen omeprazole (PRILOSEC) 20 MG capsule Take 20 mg by mouth every morning.     . pregabalin (LYRICA) 150 MG capsule Take 150 mg by mouth 2 (two) times daily.     . repaglinide (PRANDIN) 1 MG tablet TAKE 1 TABLET BY MOUTH WITH BREAKFAST, 4 TABLETS WITH TABLETS WITH LUNCH, AND 4TABLETS WITH SUPPER 810 tablet 3  . rizatriptan (MAXALT-MLT) 10 MG disintegrating tablet Take 10 mg by mouth daily as needed. May repeat in 2 hours if needed    . venlafaxine (EFFEXOR-XR) 150 MG 24 hr capsule Take 150 mg by mouth every morning.      No current facility-administered medications on file prior to visit.     Allergies  Allergen Reactions  . Betadine  [Povidone Iodine] Hives  . Penicillins Rash    Family History  Problem Relation Age of Onset  . Heart disease Mother   . Asthma Mother   . COPD Father   . Heart disease Father   . Asthma Daughter   . Migraines Daughter   . Neuropathy Son   . Cancer Neg Hx   . Anesthesia  problems Neg Hx   . Hypotension Neg Hx   . Malignant hyperthermia Neg Hx   . Pseudochol deficiency Neg Hx     BP 114/60 (BP Location: Left Arm, Patient Position: Sitting, Cuff Size: Normal)   Pulse 77   Ht 5\' 6"  (1.676 m)   Wt 169 lb 3.2 oz (76.7 kg)   SpO2 90%   BMI 27.31 kg/m   Review of Systems She denies hypoglycemia    Objective:   Physical Exam VITAL SIGNS:  See vs page GENERAL: no distress Pulses: dorsalis pedis intact bilat.   MSK: no deformity of the feet CV: 1+ bilat leg edema, and bilat vv's Skin:  no ulcer on the feet.  normal color and temp on the feet. Neuro: sensation is intact to touch on the feet.    Lab Results  Component Value Date   CREATININE 0.66 07/22/2019   BUN 13 07/22/2019   NA 138 07/22/2019   K 4.3 07/22/2019   CL 103 07/22/2019   CO2 24 07/22/2019   Lab Results  Component Value Date   TSH 2.084 07/22/2019    Lab Results  Component Value Date   HGBA1C 8.3 (A) 10/17/2019       Assessment & Plan:  Type 2 DM: she needs increased rx. This A1c is higher than would be predicted from cbg's.   Edema: This limits rx options Pancreatitis: as this has been many years, the benefit of victoza outweighs risks.  Patient Instructions  check your blood sugar once a day.  vary the time of day when you check, between before the 3 meals, and at bedtime.  also check if you have symptoms of your blood sugar being too high or too low.  please keep a record of the readings and bring it to your next appointment here (or you can bring the meter itself).  You can write it on any piece of paper.  please call us sooner if your blood sugar goes below 70, or if you have a lot of readings  over 200. A different type of diabetes blood test is requested for you today.  We'll let you know about the result. If it is good, please continue the same medications, and come [facility] for a follow-up appointment in 3 months.  If it is high, you should consider taking insulin.

## 2019-10-24 LAB — FRUCTOSAMINE: Fructosamine: 280 umol/L (ref 205–285)

## 2019-10-28 ENCOUNTER — Encounter (HOSPITAL_COMMUNITY): Payer: Medicare Other

## 2019-11-01 ENCOUNTER — Inpatient Hospital Stay (HOSPITAL_COMMUNITY): Admission: RE | Admit: 2019-11-01 | Payer: Medicare Other | Source: Home / Self Care | Admitting: Specialist

## 2019-11-01 ENCOUNTER — Encounter (HOSPITAL_COMMUNITY): Admission: RE | Payer: Self-pay | Source: Home / Self Care

## 2019-11-01 SURGERY — ARTHROPLASTY, KNEE, TOTAL
Anesthesia: Spinal | Laterality: Left

## 2019-11-11 ENCOUNTER — Encounter: Payer: Self-pay | Admitting: Cardiology

## 2019-11-11 ENCOUNTER — Other Ambulatory Visit: Payer: Self-pay

## 2019-11-11 ENCOUNTER — Ambulatory Visit: Payer: Medicare Other | Admitting: Cardiology

## 2019-11-11 VITALS — BP 110/74 | HR 86 | Temp 97.1°F | Ht 66.0 in | Wt 169.0 lb

## 2019-11-11 DIAGNOSIS — I10 Essential (primary) hypertension: Secondary | ICD-10-CM

## 2019-11-11 DIAGNOSIS — R002 Palpitations: Secondary | ICD-10-CM

## 2019-11-11 DIAGNOSIS — E782 Mixed hyperlipidemia: Secondary | ICD-10-CM | POA: Diagnosis not present

## 2019-11-11 DIAGNOSIS — R600 Localized edema: Secondary | ICD-10-CM | POA: Diagnosis not present

## 2019-11-11 NOTE — Patient Instructions (Signed)

## 2019-11-11 NOTE — Progress Notes (Signed)
Clinical Summary [name] White is a 78 y.o.female seen today for follow up of the following medical problems.   1. Palpitations - denies any recent symptoms.     2. HTN -she remains compliant with med   3. Hyperlipidemia Jan 2019 TC 113 TG 116 HDL 42 LDL 48 - compliant with statin.   - most recent labs with pcp  4. LE edema - seen by vascular, evidence of bilateral venous reflux - 06/2015 [place] no DVT - echo 06/2014 LVEF 60-65%, grade II diastolic dysfunction.     - chronic stable. Taking lasix prn, doesn't require often.   Past Medical History:  Diagnosis Date  . Asthma   . ASYMPTOMATIC POSTMENOPAUSAL STATUS 02/09/2009  . Constipation 03/19/2013  . DIABETES MELLITUS, TYPE II 09/14/2007  . HYPERCHOLESTEROLEMIA 02/09/2009  . HYPERTENSION 02/09/2009  . HYPOTHYROIDISM 09/14/2007  . MIGRAINE HEADACHE 09/14/2007  . Neuropathy   . OSTEOPOROSIS 02/09/2009  . PANCREATITIS 09/14/2007  . 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 PONV (postoperative nausea and vomiting)   . Rectocele 03/19/2013  . Shortness of breath   . SUPERFICIAL PHLEBITIS 09/14/2007  . Varicose veins      Allergies  Allergen Reactions  . Betadine [Povidone Iodine] Hives  . Penicillins Rash     Current Outpatient Medications  Medication Sig Dispense Refill  . ACCU-CHEK AVIVA PLUS test strip     . albuterol (PROVENTIL HFA;VENTOLIN HFA) 108 (90 BASE) MCG/ACT inhaler Inhale 2 puffs into the lungs every 4 (four) hours as needed.      Marland Kitchen aspirin 81 MG tablet Take 81 mg by mouth daily.    Marland Kitchen atorvastatin (LIPITOR) 40 MG tablet TAKE ONE (1) TABLET BY MOUTH EVERY DAY 90 tablet 3  . baclofen (LIORESAL) 10 MG tablet Take 10 mg by mouth daily.    Marland Kitchen BREO ELLIPTA 200-25 MCG/INH AEPB     . carvedilol (COREG) 3.125 MG tablet TAKE ONE TABLET BY MOUTH TWICE A DAY 180 tablet 3  . cycloSPORINE (RESTASIS) 0.05 % ophthalmic emulsion Place 1 drop into both eyes 2 (two) times daily.      Marland Kitchen estradiol (ESTRACE) 1 MG tablet TAKE ONE  TABLET (1MG TOTAL) BY MOUTH DAILY 90 tablet 1  . furosemide (LASIX) 20 MG tablet May take Lasix 20 mg as NEEDED for leg swelling 45 tablet 3  . Hydrocodone-Acetaminophen 2.5-325 MG TABS Take 5-325 mg by mouth as needed.    . Insulin Pen Needle (NOVOTWIST) 32G X 5 MM MISC Use as directed once a day.   V250.00 100 each 6  . INVOKANA 100 MG TABS tablet TAKE 1/2 TABLET BY MOUTH ONCE DAILY 30 tablet 3  . levothyroxine (SYNTHROID) 50 MCG tablet Take 1 tablet (50 mcg total) by mouth daily before breakfast. 90 tablet 2  . liraglutide (VICTOZA) 18 MG/3ML SOPN Inject 1.8 mg into the skin daily.    Marland Kitchen lisinopril (PRINIVIL,ZESTRIL) 5 MG tablet Take 5 mg by mouth every morning.      . metFORMIN (GLUCOPHAGE-XR) 500 MG 24 hr tablet TAKE TWO (2) TABLETS BY MOUTH DAILY 180 tablet 3  . Multiple Vitamins-Minerals (MULTIVITAMINS THER. W/MINERALS) TABS Take 1 tablet by mouth every morning.      Marland Kitchen omeprazole (PRILOSEC) 20 MG capsule Take 20 mg by mouth every morning.     . pregabalin (LYRICA) 150 MG capsule Take 150 mg by mouth 2 (two) times daily.     . repaglinide (PRANDIN) 1 MG tablet TAKE 1 TABLET BY MOUTH WITH BREAKFAST, 4  TABLETS WITH TABLETS WITH LUNCH, AND 4TABLETS WITH SUPPER 810 tablet 3  . rizatriptan (MAXALT-MLT) 10 MG disintegrating tablet Take 10 mg by mouth daily as needed. May repeat in 2 hours if needed    . venlafaxine (EFFEXOR-XR) 150 MG 24 hr capsule Take 150 mg by mouth every morning.      No current facility-administered medications for this visit.      Past Surgical History:  Procedure Laterality Date  . ABDOMINAL HYSTERECTOMY    . ANTERIOR AND POSTERIOR REPAIR N/A 04/02/2013   Procedure: ANTERIOR (CYSTOCELE) AND POSTERIOR REPAIR (RECTOCELE);  Surgeon: Jonnie Kind, MD;  Location: [facility];  Service: Gynecology;  Laterality: N/A;  . CATARACT EXTRACTION W/PHACO  12/05/2011   Procedure: CATARACT EXTRACTION PHACO AND INTRAOCULAR LENS PLACEMENT (IOC);  Surgeon: Tonny Alontae Chaloux;  Location: [facility];   Service: Ophthalmology;  Laterality: Left;  CDE:9.65  . CHOLECYSTECTOMY    . CYSTOSCOPY    . ENDOVENOUS ABLATION SAPHENOUS VEIN W/ LASER  11-03-2011   right greater saphenous vein   left leg done 10-2011  . EYE SURGERY  98   right cataract extraction 98  . LAPAROSCOPIC NISSEN FUNDOPLICATION    . NM ESOPHAGEAL REFLUX  08-11-11  . PITUITARY EXCISION  10/1997  . POSTERIOR REPAIR    . TRANSPHENOIDAL / TRANSNASAL HYPOPHYSECTOMY / RESECTION PITUITARY TUMOR  08-11-11     Allergies  Allergen Reactions  . Betadine [Povidone Iodine] Hives  . Penicillins Rash      Family History  Problem Relation Age of Onset  . Heart disease Mother   . Asthma Mother   . COPD Father   . Heart disease Father   . Asthma Daughter   . Migraines Daughter   . Neuropathy Son   . Cancer Neg Hx   . Anesthesia problems Neg Hx   . Hypotension Neg Hx   . Malignant hyperthermia Neg Hx   . Pseudochol deficiency Neg Hx      Social History Ms. Cobern reports that she has never smoked. She has never used smokeless tobacco. Ms. Everetts reports no history of alcohol use.   Review of Systems CONSTITUTIONAL: No weight loss, fever, chills, weakness or fatigue.  HEENT: Eyes: No visual loss, blurred vision, double vision or yellow sclerae.No hearing loss, sneezing, congestion, runny nose or sore throat.  SKIN: No rash or itching.  CARDIOVASCULAR: per hpi RESPIRATORY: No shortness of breath, cough or sputum.  GASTROINTESTINAL: No anorexia, nausea, vomiting or diarrhea. No abdominal pain or blood.  GENITOURINARY: No burning on urination, no polyuria NEUROLOGICAL: No headache, dizziness, syncope, paralysis, ataxia, numbness or tingling in the extremities. No change in bowel or bladder control.  MUSCULOSKELETAL: No muscle, back pain, joint pain or stiffness.  LYMPHATICS: No enlarged nodes. No history of splenectomy.  PSYCHIATRIC: No history of depression or anxiety.  ENDOCRINOLOGIC: No reports of sweating, cold or  heat intolerance. No polyuria or polydipsia.  Marland Kitchen   Physical Examination Today's Vitals   11/11/19 1530  BP: 110/74  Pulse: 86  Temp: (!) 97.1 F (36.2 C)  TempSrc: Temporal  SpO2: 99%  Weight: 169 lb (76.7 kg)  Height: _0  (1.676 m)   Body mass index is 27.28 kg/m.  Gen: resting comfortably, no acute distress HEENT: no scleral icterus, pupils equal round and reactive, no palptable cervical adenopathy,  CV: RRR, no m/r/g, no jvd Resp: Clear to auscultation bilaterally GI: abdomen is soft, non-tender, non-distended, normal bowel sounds, no hepatosplenomegaly MSK: extremities are warm, no edema.  Skin: warm, no rash Neuro:  no focal deficits Psych: appropriate affect   Diagnostic Studies 06/2014 Echo Study Conclusions  - Left ventricle: The cavity size was normal. Wall thickness was increased in a pattern of mild LVH. Systolic function was normal. The estimated ejection fraction was in the range of 60% to 65%. Wall motion was normal; there were no regional wall motion abnormalities. Features are consistent with a pseudonormal left ventricular filling pattern, with concomitant abnormal relaxation and increased filling pressure (grade 2 diastolic dysfunction). - Aortic valve: There was trivial regurgitation. - Mitral valve: Calcified annulus. There was mild regurgitation. - Left atrium: The atrium was moderately dilated. - Right atrium: Central venous pressure (est): 3 mm Hg. - Tricuspid valve: There was trivial regurgitation. - Pulmonary arteries: Systolic pressure could not be accurately estimated. - Pericardium, extracardiac: There was no pericardial effusion.  Impressions:  - Mild LVH with LVEF 15-17%, grade 2 diastolic dysfunction. Moderate left atrial enlargement. MAC with mild mitral regurgitation. Unable to assess PASP.    Assessment and Plan   1. Palpitations -doing well without recent symptoms, we will continue to monitor  2. HTN - bp at  goal,continue current meds  3. Hyperlipidemia - request labs from pcp, continue statin  4. LE edema -controlled, continue prn diuretic  F/u 1 year       Arnoldo Lenis, M.D.

## 2019-11-27 ENCOUNTER — Other Ambulatory Visit: Payer: Self-pay | Admitting: Endocrinology

## 2019-11-27 DIAGNOSIS — E119 Type 2 diabetes mellitus without complications: Secondary | ICD-10-CM

## 2019-12-23 ENCOUNTER — Other Ambulatory Visit: Payer: Self-pay | Admitting: Endocrinology

## 2019-12-26 ENCOUNTER — Encounter: Payer: Self-pay | Admitting: Endocrinology

## 2019-12-26 ENCOUNTER — Other Ambulatory Visit: Payer: Self-pay | Admitting: Endocrinology

## 2019-12-26 MED ORDER — FARXIGA 5 MG PO TABS: 2.5000 mg | ORAL_TABLET | Freq: Every day | ORAL | 11 refills | Status: DC

## 2020-01-02 ENCOUNTER — Other Ambulatory Visit (HOSPITAL_COMMUNITY): Payer: Self-pay | Admitting: *Deleted

## 2020-01-14 ENCOUNTER — Other Ambulatory Visit: Payer: Self-pay | Admitting: Family Medicine

## 2020-01-21 ENCOUNTER — Ambulatory Visit: Payer: Medicare Other | Admitting: Endocrinology

## 2020-01-28 ENCOUNTER — Ambulatory Visit (INDEPENDENT_AMBULATORY_CARE_PROVIDER_SITE_OTHER): Payer: Medicare Other | Admitting: Family Medicine

## 2020-01-28 ENCOUNTER — Encounter: Payer: Self-pay | Admitting: Family Medicine

## 2020-01-28 ENCOUNTER — Other Ambulatory Visit: Payer: Self-pay

## 2020-01-28 VITALS — BP 103/70 | HR 89 | Temp 98.1°F | Ht 64.0 in | Wt 168.8 lb

## 2020-01-28 DIAGNOSIS — N951 Menopausal and female climacteric states: Secondary | ICD-10-CM

## 2020-01-28 DIAGNOSIS — R42 Dizziness and giddiness: Secondary | ICD-10-CM

## 2020-01-28 DIAGNOSIS — E7800 Pure hypercholesterolemia, unspecified: Secondary | ICD-10-CM

## 2020-01-28 DIAGNOSIS — E039 Hypothyroidism, unspecified: Secondary | ICD-10-CM | POA: Diagnosis not present

## 2020-01-28 DIAGNOSIS — M810 Age-related osteoporosis without current pathological fracture: Secondary | ICD-10-CM

## 2020-01-28 DIAGNOSIS — Z1231 Encounter for screening mammogram for malignant neoplasm of breast: Secondary | ICD-10-CM | POA: Insufficient documentation

## 2020-01-28 DIAGNOSIS — E559 Vitamin D deficiency, unspecified: Secondary | ICD-10-CM | POA: Insufficient documentation

## 2020-01-28 DIAGNOSIS — M25511 Pain in right shoulder: Secondary | ICD-10-CM

## 2020-01-28 DIAGNOSIS — I10 Essential (primary) hypertension: Secondary | ICD-10-CM | POA: Diagnosis not present

## 2020-01-28 DIAGNOSIS — Z780 Asymptomatic menopausal state: Secondary | ICD-10-CM

## 2020-01-28 DIAGNOSIS — G43809 Other migraine, not intractable, without status migrainosus: Secondary | ICD-10-CM | POA: Diagnosis not present

## 2020-01-28 NOTE — Patient Instructions (Addendum)
COVID-19 Vaccine Information can be found at: [email or web address] For questions related to vaccine distribution or appointments, please email [email or web address] or call [phone].    Scheduling mammogram and bone density Xray shoulder PT shoulder Neurology evaluation -new onset dizziness CBC, Bmp and Vit D  Potassium Content of Foods  Potassium is a mineral found in many foods and drinks. It affects how the heart works, and helps keep fluids and minerals balanced in the body. The amount of potassium you need each day depends on your age and any medical conditions you may have. Talk to your health care provider or dietitian about how much potassium you need. The following lists of foods provide the general serving size for foods and the approximate amount of potassium in each serving, listed in milligrams (mg). Actual values may vary depending on the product and how it is processed. High in potassium The following foods and beverages have 200 mg or more of potassium per serving:  Apricots (raw) - 2 have 200 mg of potassium.  Apricots (dry) - 5 have 200 mg of potassium.  Artichoke - 1 medium has 345 mg of potassium.  Avocado -  fruit has 245 mg of potassium.  Banana - 1 medium fruit has 425 mg of potassium.  [place] or baked beans (canned) -  cup has 280 mg of potassium.  White beans (canned) -  cup has 595 mg potassium.  Beef roast - 3 oz has 320 mg of potassium.  Ground beef - 3 oz has 270 mg of potassium.  Beets (raw or cooked) -  cup has 260 mg of potassium.  Bran muffin - 2 oz has 300 mg of potassium.  Broccoli (cooked) -  cup has 230 mg of potassium.  Brussels sprouts -  cup has 250 mg of potassium.  Cantaloupe -  cup has 215 mg of potassium.  Cereal, 100% bran -  cup has 200-400 mg of potassium.  Cheeseburger -1 single fast food burger has 225-400 mg of potassium.  Chicken - 3 oz has 220 mg  of potassium.  Clams (canned) - 3 oz has 535 mg of potassium.  Crab - 3 oz has 225 mg of potassium.  Dates - 5 have 270 mg of potassium.  Dried beans and peas -  cup has 300-475 mg of potassium.  Figs (dried) - 2 have 260 mg of potassium.  Fish (halibut, tuna, cod, snapper) - 3 oz has 480 mg of potassium.  Fish (salmon, haddock, swordfish, perch) - 3 oz has 300 mg of potassium.  Fish (tuna, canned) - 3 oz has 200 mg of potassium.  [place] fries (fast food) - 3 oz has 470 mg of potassium.  Granola with fruit and nuts -  cup has 200 mg of potassium.  Grapefruit juice -  cup has 200 mg of potassium.  Honeydew melon -  cup has 200 mg of potassium.  Kale (raw) - 1 cup has 300 mg of potassium.  Kiwi - 1 medium fruit has 240 mg of potassium.  Kohlrabi, rutabaga, parsnips -  cup has 280 mg of potassium.  Lentils -  cup has 365 mg of potassium.  Mango - 1 each has 325 mg of potassium.  Milk (nonfat, low-fat, whole, buttermilk) - 1 cup has 350-380 mg of potassium.  Milk (chocolate) - 1 cup has 420 mg of potassium  Molasses - 1 Tbsp has 295 mg of potassium.  Mushrooms -  cup has 280 mg of potassium.  Nectarine - 1 each has  275 mg of potassium.  Nuts (almonds, peanuts, hazelnuts, [place], cashew, mixed) - 1 oz has 200 mg of potassium.  Nuts (pistachios) - 1 oz has 295 mg of potassium.  Orange - 1 fruit has 240 mg of potassium.  Orange juice -  cup has 235 mg of potassium.  Papaya -  medium fruit has 390 mg of potassium.  Peanut butter (chunky) - 2 Tbsp has 240 mg of potassium.  Peanut butter (smooth) - 2 Tbsp has 210 mg of potassium.  Pear - 1 medium (200 mg) of potassium.  Pomegranate - 1 whole fruit has 400 mg of potassium.  Pomegranate juice -  cup has 215 mg of potassium.  Pork - 3 oz has 350 mg of potassium.  Potato chips (salted) - 1 oz has 465 mg of potassium.  Potato (baked with skin) - 1 medium has 925 mg of potassium.  Potato (boiled) -   cup has 255 mg of potassium.  Potato (Mashed) -  cup has 330 mg of potassium.  Prune juice -  cup has 370 mg of potassium.  Prunes - 5 have 305 mg of potassium.  Pudding (chocolate) -  cup has 230 mg of potassium.  Pumpkin (canned) -  cup has 250 mg of potassium.  Raisins (seedless) -  cup has 270 mg of potassium.  Seeds (sunflower or pumpkin) - 1 oz has 240 mg of potassium.  Soy milk - 1 cup has 300 mg of potassium.  Spinach (cooked) - 1/2 cup has 420 mg of potassium.  Spinach (canned) -  cup has 370 mg of potassium.  Sweet potato (baked with skin) - 1 medium has 450 mg of potassium.  Swiss chard -  cup has 480 mg of potassium.  Tomato or vegetable juice -  cup has 275 mg of potassium.  Tomato (sauce or puree) -  cup has 400-550 mg of potassium.  Tomato (raw) - 1 medium has 290 mg of potassium.  Tomato (canned) -  cup has 200-300 mg of potassium.  [place] - 3 oz has 250 mg of potassium.  Wheat germ - 1 oz has 250 mg of potassium.  Winter squash -  cup has 250 mg of potassium.  Yogurt (plain or fruited) - 6 oz has 260-435 mg of potassium.  Zucchini -  cup has 220 mg of potassium. Moderate in potassium The following foods and beverages have 50-200 mg of potassium per serving:  Apple - 1 fruit has 150 mg of potassium  Apple juice -  cup has 150 mg of potassium  Applesauce -  cup has 90 mg of potassium  Apricot nectar -  cup has 140 mg of potassium  Asparagus (small spears) -  cup has 155 mg of potassium  Asparagus (large spears) - 6 have 155 mg of potassium  Bagel (cinnamon raisin) - 1 four-inch bagel has 130 mg of potassium  Bagel (egg or plain) - 1 four- inch bagel has 70 mg of potassium  Beans (green) -  cup has 90 mg of potassium  Beans (yellow) -  cup has 190 mg of potassium  Beer, regular - 12 oz has 100 mg of potassium  Beets (canned) -  cup has 125 mg of potassium  Blackberries -  cup has 115 mg of potassium  Blueberries  -  cup has 60 mg of potassium  Bread (whole wheat) - 1 slice has 70 mg of potassium  Broccoli (raw) -  cup has 145 mg of potassium  Cabbage -  cup has 150 mg of potassium  Carrots (cooked or raw) -  cup has 180 mg of potassium  Cauliflower (raw) -  cup has 150 mg of potassium  Celery (raw) -  cup has 155 mg of potassium  Cereal, bran flakes -  cup has 120-150 mg of potassium  Cheese (cottage) -  cup has 110 mg of potassium  Cherries - 10 have 150 mg of potassium  Chocolate - 1 oz bar has 165 mg of potassium  Coffee (brewed) - 6 oz has 90 mg of potassium  Corn -  cup or 1 ear has 195 mg of potassium  Cucumbers -  cup has 80 mg of potassium  Egg - 1 large egg has 60 mg of potassium  Eggplant -  cup has 60 mg of potassium  Endive (raw) -  cup has 80 mg of potassium  English muffin - 1 has 65 mg of potassium  Fish (ocean perch) - 3 oz has 192 mg of potassium  Frankfurter, beef or pork - 1 has 75 mg of potassium  Fruit cocktail -  cup has 115 mg of potassium  Grape juice -  cup has 170 mg of potassium  Grapefruit -  fruit has 175 mg of potassium  Grapes -  cup has 155 mg of potassium  Greens: kale, turnip, collard -  cup has 110-150 mg of potassium  Ice cream or frozen yogurt (chocolate) -  cup has 175 mg of potassium  Ice cream or frozen yogurt (vanilla) -  cup has 120-150 mg of potassium  Lemons, limes - 1 each has 80 mg of potassium  Lettuce - 1 cup has 100 mg of potassium  Mixed vegetables -  cup has 150 mg of potassium  Mushrooms, raw -  cup has 110 mg of potassium  Nuts (walnuts, pecans, or macadamia) - 1 oz has 125 mg of potassium  Oatmeal -  cup has 80 mg of potassium  Okra -  cup has 110 mg of potassium  Onions -  cup has 120 mg of potassium  Peach - 1 has 185 mg of potassium  Peaches (canned) -  cup has 120 mg of potassium  Pears (canned) -  cup has 120 mg of potassium  Peas, green (frozen) -  cup has 90 mg  of potassium  Peppers (Green) -  cup has 130 mg of potassium  Peppers (Red) -  cup has 160 mg of potassium  Pineapple juice -  cup has 165 mg of potassium  Pineapple (fresh or canned) -  cup has 100 mg of potassium  Plums - 1 has 105 mg of potassium  Pudding, vanilla -  cup has 150 mg of potassium  Raspberries -  cup has 90 mg of potassium  Rhubarb -  cup has 115 mg of potassium  Rice, wild -  cup has 80 mg of potassium  Shrimp - 3 oz has 155 mg of potassium  Spinach (raw) - 1 cup has 170 mg of potassium  Strawberries -  cup has 125 mg of potassium  Summer squash -  cup has 175-200 mg of potassium  Swiss chard (raw) - 1 cup has 135 mg of potassium  Tangerines - 1 fruit has 140 mg of potassium  Tea, brewed - 6 oz has 65 mg of potassium  Turnips -  cup has 140 mg of potassium  Watermelon -  cup has 85 mg of potassium  Wine (Red, table) -  5 oz has 180 mg of potassium  Wine (White, table) - 5 oz 100 mg of potassium Low in potassium The following foods and beverages have less than 50 mg of potassium per serving.  Bread (white) - 1 slice has 30 mg of potassium  Carbonated beverages - 12 oz has less than 5 mg of potassium  Cheese - 1 oz has 20-30 mg of potassium  Cranberries -  cup has 45 mg of potassium  Cranberry juice cocktail -  cup has 20 mg of potassium  Fats and oils - 1 Tbsp has less than 5 mg of potassium  Hummus - 1 Tbsp has 32 mg of potassium  Nectar (papaya, mango, or pear) -  cup has 35 mg of potassium  Rice (white or brown) -  cup has 50 mg of potassium  Spaghetti or macaroni (cooked) -  cup has 30 mg of potassium  [place], flour or corn - 1 has 50 mg of potassium  Waffle - 1 four-inch waffle has 50 mg of potassium  Water chestnuts -  cup has 40 mg of potassium Summary  Potassium is a mineral found in many foods and drinks. It affects how the heart works, and helps keep fluids and minerals balanced in the body.  The  amount of potassium you need each day depends on your age and any existing medical conditions you may have. Your health care provider or dietitian may recommend an amount of potassium that you should have each day. This information is not intended to replace advice given to you by your health care provider. Make sure you discuss any questions you have with your health care provider. Document Revised: 11/24/2017 Document Reviewed: 03/08/2017 Elsevier Patient Education  [place].

## 2020-01-28 NOTE — Progress Notes (Signed)
New Patient Office Visit  Subjective:  Patient ID: [name] White, female    DOB: March 31, 1941  Age: 79 y.o. MRN: [id number] [facility] Date ID   Written Drug Qty Days Prescriber Rx # Pharmacy Refill   Daily Dose* Pymt Type PMP    12/11/2019  1   09/18/2019  Pregabalin 150 MG Capsule  180.00  90 Ed Haw   [id number]   Rei ([id number])   1  2.01 LME  Medicare   [place]  11/27/2019  1   07/12/2019  Alprazolam 0.25 MG Tablet  60.00  30 Ed Haw   [id number]   Rei ([id number])   2  1.00 LME  Medicare   [place]  10/16/2019  1   07/12/2019  Alprazolam 0.25 MG Tablet  60.00  30 Ed Haw   [id number]   Rei ([id number])   1  1.00 LME  Medicare   [place]  09/18/2019  1   09/18/2019  Pregabalin 150 MG Capsule  180.00  90 Ed Haw   [id number]   Rei ([id number])   0  2.01 LME  Medicare   [place]  08/21/2019  1   08/21/2019  Pregabalin 150 MG Capsule  60.00  30 Ed Haw   [id number]   Rei ([id number])   0  2.01 LME  Medicare   [place]  07/24/2019  1   04/24/2019  Pregabalin 150 MG Capsule  60.00  30 Ed Haw   [id number]   Rei ([id number])   3  2.01 LME  Medicare   [place]  07/12/2019  1   07/12/2019  Alprazolam 0.25 MG Tablet  60.00  30 Ed Haw   [id number]   Rei ([id number])   0  1.00 LME  Medicare   [place]  07/12/2019  1   07/12/2019  Hydrocodone-Acetamin 5-325 MG  100.00  25 Ed Haw   [id number]   Rei ([id number])   0  20.00 MME  Private Pay   [place]  06/26/2019  1   04/24/2019  Pregabalin 150 MG Capsule  60.00  30 Ed Haw   [id number]   Rei ([id number])   2  2.01 LME  Medicare   [place]  05/22/2019  1   04/24/2019  Pregabalin 150 MG Capsule  60.00  30 Ed Haw   [id number]   Rei ([id number])   1  2.01 LME  Medicare   [place]  04/25/2019  1   04/24/2019  Pregabalin 150 MG Capsule  60.00  30 Ed Haw   [id number]   Rei ([id number])   0  2.01 LME  Medicare   [place]  03/22/2019  1   12/27/2018  Pregabalin 150 MG Capsule  60.00  30 Ed Haw   [id number]   Rei ([id number])   3  2.01 LME  Medicare   [place]  02/20/2019  1   12/27/2018  Pregabalin 150 MG Capsule  60.00  30 Ed Haw   [id number]   Rei ([id number])   2  2.01 LME  Private Pay   [place]  01/23/2019  1   12/27/2018  Pregabalin 150 MG Capsule  60.00  30 Ed  Haw   [id number]   Rei ([id number])   1  2.01 LME  Medicare   [place]  12/27/2018  1   12/27/2018  Pregabalin 150 MG Capsule  60.00  30 Ed Haw   [id number]   Rei ([id number])   0  2.01 LME  Medicare     11/28/2018  1   08/22/2018  Pregabalin 150  MG Capsule  60.00  30 Ed Haw   [id number]   Rei ([id number])   3  2.01 LME  Medicare   [place]  10/24/2018  1   08/22/2018  Pregabalin 150 MG Capsule  60.00  30 Ed Haw   [id number]   Rei ([id number])   2  2.01 LME  Medicare   [place]  09/26/2018  1   08/22/2018  Pregabalin 150 MG Capsule  60.00  30 Ed Haw   [id number]   Rei ([id number])   1  2.01 LME  Medicare   [name]  08/22/2018  1   08/22/2018  Pregabalin 150 MG Capsule  60.00  30 Ed Haw   [id number]   Rei ([id number])   0  2.01 LME  Medicare   [place]  07/25/2018  1   07/25/2018  Pregabalin 150 MG Capsule  60.00  30 Ed Haw   [id number]   Rei ([id number])   0  2.01 LME  Medicare   [place]  06/25/2018  1   03/28/2018  Lyrica 150 MG Capsule  60.00  30 Ed Haw   [id number]   Rei ([id number])   3  2.01 LME  Medicare   [place]  05/30/2018  1   03/28/2018  Lyrica 150 MG Capsule  60.00  30 Ed Haw   [id number]   Rei ([id number])   2  2.01 LME  Medicare   [place]  04/25/2018  1   03/28/2018  Lyrica 150 MG Capsule  60.00  30 Ed Haw   [id number]   Rei ([id number])   1  2.01 LME  Medicare   [place]  03/28/2018  1   03/28/2018  Lyrica 150 MG Capsule  60.00  30 Ed Haw   [id number]   Rei ([id number])   0  2.01 LME  Medicare   [place]  02/28/2018  1   10/02/2017  Lyrica 150 MG Capsule  60.00  30 Ed Haw   [id number]   Rei ([id number])   4  2.01 LME  Medicare   [place]  01/31/2018  1   10/02/2017  Lyrica 150 MG Capsule  60.00  30 Ed Haw   [id number]   Rei ([id number])   3  2.01 LME  Medicare     CC:  Chief Complaint  Patient presents with  . Establish Care  . Shoulder Pain    Right Pain  . Leg Pain    leg cramps at night & first thing in the mornings     HPI [name] White presents for DEXA-1/19, Vit D 17.2 DM-Victoza/Metformin/Prandin-endo Dr. Loanne Drilling, Diabetic eye exam -Dr. Remi Haggard fields Hyperlipidemia-lipitor HTN-coreg/zestril-lasix prn GERD-prilosec  qam Migraine-maxalt-occasionally Post menopausal-estrace-needs dexa Anxiety-effexor xr-long term, pt took xanax in the past   In basement lifting containers-right handed-lifted away from body and felt a pop in rotation on the right shoulder. Pt noted 2-3 weeks ago-hurt then resolved. No prior shoulder injury. PT in the past, pt with dizziness with certain head movements. No visual changes. No difficulty with walking. Pt states no dizziness with head movement other than when looking overhead Past Medical History:  Diagnosis Date  . Anxiety   . Asthma   . ASYMPTOMATIC POSTMENOPAUSAL STATUS 02/09/2009  . Cataract   . Constipation 03/19/2013  . Depression   . DIABETES MELLITUS, TYPE II 09/14/2007  . HYPERCHOLESTEROLEMIA 02/09/2009  . HYPERTENSION 02/09/2009  . HYPOTHYROIDISM 09/14/2007  . MIGRAINE HEADACHE 09/14/2007  . Neuropathy   . OSTEOPOROSIS 02/09/2009  . PANCREATITIS 09/14/2007  . PITUITARY  ADENOMA 09/14/2007  . PONV (postoperative nausea and vomiting)   . Rectocele 03/19/2013  . Shortness of breath   . SUPERFICIAL PHLEBITIS 09/14/2007  . Varicose veins     Past Surgical History:  Procedure Laterality Date  . ABDOMINAL HYSTERECTOMY    . ANTERIOR AND POSTERIOR REPAIR N/A 04/02/2013   Procedure: ANTERIOR (CYSTOCELE) AND POSTERIOR REPAIR (RECTOCELE);  Surgeon: Jonnie Kind, MD;  Location: [facility];  Service: Gynecology;  Laterality: N/A;  . APPENDECTOMY    . BRAIN SURGERY    . CATARACT EXTRACTION W/PHACO  12/05/2011   Procedure: CATARACT EXTRACTION PHACO AND INTRAOCULAR LENS PLACEMENT (IOC);  Surgeon: Tonny Branch;  Location: [facility];  Service: Ophthalmology;  Laterality: Left;  CDE:9.65  . CHOLECYSTECTOMY    . CYSTOSCOPY    . ENDOVENOUS ABLATION SAPHENOUS VEIN W/ LASER  11-03-2011   right greater saphenous vein   left leg done 10-2011  . EYE SURGERY  98   right cataract extraction 98  . LAPAROSCOPIC NISSEN FUNDOPLICATION    . NM ESOPHAGEAL REFLUX  08-11-11  . PITUITARY EXCISION   10/1997  . POSTERIOR REPAIR    . TRANSPHENOIDAL / TRANSNASAL HYPOPHYSECTOMY / RESECTION PITUITARY TUMOR  08-11-11    Family History  Problem Relation Age of Onset  . Heart disease Mother   . Asthma Mother   . COPD Father   . Heart disease Father   . Stroke Father   . Asthma Daughter   . Migraines Daughter   . Neuropathy Son   . Cancer Neg Hx   . Anesthesia problems Neg Hx   . Hypotension Neg Hx   . Malignant hyperthermia Neg Hx   . Pseudochol deficiency Neg Hx     Social History   Socioeconomic History  . Marital status: Married    Spouse name: Not on file  . Number of children: Not on file  . Years of education: Not on file  . Highest education level: Not on file  Occupational History  . Occupation: Retired    Fish farm manager: RETIRED  Tobacco Use  . Smoking status: Never Smoker  . Smokeless tobacco: Never Used  Substance and Sexual Activity  . Alcohol use: No    Alcohol/week: 0.0 standard drinks  . Drug use: No  . Sexual activity: Yes    Birth control/protection: Surgical  Other Topics Concern  . Not on file  Social History Narrative  . Not on file   Social Determinants of Health   Financial Resource Strain:   . Difficulty of Paying Living Expenses: Not on file  Food Insecurity:   . Worried About Charity fundraiser in the Last Year: Not on file  . Ran Out of Food in the Last Year: Not on file  Transportation Needs:   . Lack of Transportation (Medical): Not on file  . Lack of Transportation (Non-Medical): Not on file  Physical Activity:   . Days of Exercise per Week: Not on file  . Minutes of Exercise per Session: Not on file  Stress:   . Feeling of Stress : Not on file  Social Connections:   . Frequency of Communication with Friends and Family: Not on file  . Frequency of Social Gatherings with Friends and Family: Not on file  . Attends Religious Services: Not on file  . Active Member of Clubs or Organizations: Not on file  . Attends Archivist  Meetings: Not on file  . Marital Status: Not on file  Intimate Partner Violence:   .  Fear of Current or Ex-Partner: Not on file  . Emotionally Abused: Not on file  . Physically Abused: Not on file  . Sexually Abused: Not on file    ROS Review of Systems  Respiratory:       Athma-Breo daily, albuterol MDI-used  1-2 times a day-worse in the winter  Musculoskeletal: Positive for myalgias.       Baclofen given for leg cramps-taking several cramps  Neurological:       Headaches-migraine-uses maxalt-monthly-trigger-unknown  Hematological: Negative.     Objective:   Today's Vitals: BP 103/70 (BP Location: Right Arm, Patient Position: Sitting, Cuff Size: Normal)   Pulse 89   Temp 98.1 F (36.7 C) (Oral)   Ht 5\' 4"  (1.626 m)   Wt 168 lb 12.8 oz (76.6 kg)   SpO2 98%   BMI 28.97 kg/m   Physical Exam Constitutional:      Appearance: Normal appearance.  HENT:     Head: Normocephalic and atraumatic.  Cardiovascular:     Rate and Rhythm: Normal rate and regular rhythm.     Heart sounds: Normal heart sounds.  Pulmonary:     Effort: Pulmonary effort is normal.     Breath sounds: Normal breath sounds.  Musculoskeletal:     Cervical back: Normal range of motion and neck supple.  Neurological:     Mental Status: She is alert and oriented to person, place, and time.  Psychiatric:        Mood and Affect: Mood normal.        Behavior: Behavior normal.     Assessment & Plan:  1. Post-menopausal - DG Bone Density; Future - MM Digital Screening; Future  2. Encounter for screening mammogram for malignant neoplasm of breast - MM Digital Screening; Future  3. Essential hypertension Coreg, zestril-BMP-stable  4. Hypothryoid-synthroid-normal TSH-7/21  5. Other migraine without status migrainosus, not intractable Used episodically-imitrex  6. HYPERCHOLESTEROLEMIA 7/20-lipid panel, lft  7. MENOPAUSAL SYNDROME Bone density suggested  8. Osteoporosis, unspecified osteoporosis  type, unspecified pathological fracture presence DEXA - VITAMIN D 25 Hydroxy (Vit-D Deficiency, Fractures)  9. Vitamin D deficiency DEXA 10. Acute pain of right shoulder - DG Shoulder Right; Future - Ambulatory referral to Physical Therapy  11. Dizzinesses Concern for dizziness with looking overhead vs with other movements - Ambulatory referral to Neurology - CBC w/Diff/Platelet Outpatient Encounter Medications as of 01/28/2020  Medication Sig  . ACCU-CHEK AVIVA PLUS test strip TEST TWICE DAILY.  Marland Kitchen albuterol (PROVENTIL HFA;VENTOLIN HFA) 108 (90 BASE) MCG/ACT inhaler Inhale 2 puffs into the lungs every 4 (four) hours as needed.    Marland Kitchen aspirin 81 MG tablet Take 81 mg by mouth daily.  Marland Kitchen atorvastatin (LIPITOR) 40 MG tablet TAKE ONE (1) TABLET BY MOUTH EVERY DAY  . baclofen (LIORESAL) 10 MG tablet Take 10 mg by mouth daily.  Marland Kitchen BREO ELLIPTA 200-25 MCG/INH AEPB   . carvedilol (COREG) 3.125 MG tablet TAKE ONE TABLET BY MOUTH TWICE A DAY  . cycloSPORINE (RESTASIS) 0.05 % ophthalmic emulsion Place 1 drop into both eyes 2 (two) times daily.    . dapagliflozin propanediol (FARXIGA) 5 MG TABS tablet Take 2.5 mg by mouth daily before breakfast.  . estradiol (ESTRACE) 1 MG tablet TAKE ONE TABLET (1MG  TOTAL) BY MOUTH DAILY  . furosemide (LASIX) 20 MG tablet May take Lasix 20 mg as NEEDED for leg swelling  . Hydrocodone-Acetaminophen 2.5-325 MG TABS Take 5-325 mg by mouth as needed.  . Insulin Pen Needle (NOVOTWIST) 32G X 5  MM MISC Use as directed once a day.   V250.00  . levothyroxine (SYNTHROID) 50 MCG tablet Take 1 tablet (50 mcg total) by mouth daily before breakfast.  . liraglutide (VICTOZA) 18 MG/3ML SOPN Inject 1.8 mg into the skin daily.  Marland Kitchen lisinopril (PRINIVIL,ZESTRIL) 5 MG tablet Take 5 mg by mouth every morning.    . metFORMIN (GLUCOPHAGE-XR) 500 MG 24 hr tablet TAKE TWO (2) TABLETS BY MOUTH DAILY  . Multiple Vitamins-Minerals (MULTIVITAMINS THER. W/MINERALS) TABS Take 1 tablet by mouth  every morning.    Marland Kitchen omeprazole (PRILOSEC) 20 MG capsule Take 20 mg by mouth every morning.   . pregabalin (LYRICA) 150 MG capsule Take 150 mg by mouth 2 (two) times daily.   . repaglinide (PRANDIN) 1 MG tablet TAKE 1 TABLET BY MOUTH WITH BREAKFAST, 4 TABLETS WITH TABLETS WITH LUNCH, AND 4TABLETS WITH SUPPER  . rizatriptan (MAXALT-MLT) 10 MG disintegrating tablet Take 10 mg by mouth daily as needed. May repeat in 2 hours if needed  . venlafaxine (EFFEXOR-XR) 150 MG 24 hr capsule Take 150 mg by mouth every morning.    No facility-administered encounter medications on file as of 01/28/2020.    Follow-up: 6 months  Montray Kliebert Hannah Beat, MD

## 2020-01-30 ENCOUNTER — Telehealth: Payer: Self-pay | Admitting: Family Medicine

## 2020-01-30 NOTE — Telephone Encounter (Signed)
Denman George is calling from [facility] and states the order for physical therapy for patient shoulder needs to be changed to Occupational Therapy. Requesting a call back at [phone]

## 2020-01-30 NOTE — Telephone Encounter (Signed)
I spoke to [name] White and referral was changed

## 2020-01-31 ENCOUNTER — Other Ambulatory Visit: Payer: Self-pay

## 2020-01-31 ENCOUNTER — Encounter (HOSPITAL_COMMUNITY): Payer: Self-pay

## 2020-01-31 ENCOUNTER — Ambulatory Visit (HOSPITAL_COMMUNITY)
Admission: RE | Admit: 2020-01-31 | Discharge: 2020-01-31 | Disposition: A | Discharge status: Home / Self Care | Payer: Medicare Other | Source: Ambulatory Visit | Attending: Family Medicine | Admitting: Family Medicine

## 2020-01-31 DIAGNOSIS — M19011 Primary osteoarthritis, right shoulder: Secondary | ICD-10-CM | POA: Diagnosis not present

## 2020-01-31 DIAGNOSIS — M25511 Pain in right shoulder: Secondary | ICD-10-CM

## 2020-02-02 DIAGNOSIS — M25511 Pain in right shoulder: Secondary | ICD-10-CM | POA: Insufficient documentation

## 2020-02-02 DIAGNOSIS — R42 Dizziness and giddiness: Secondary | ICD-10-CM | POA: Insufficient documentation

## 2020-02-05 ENCOUNTER — Other Ambulatory Visit: Payer: Self-pay

## 2020-02-05 ENCOUNTER — Ambulatory Visit: Payer: Medicare Other | Admitting: Endocrinology

## 2020-02-05 ENCOUNTER — Encounter: Payer: Self-pay | Admitting: Endocrinology

## 2020-02-05 VITALS — BP 104/60 | HR 96 | Ht 64.0 in | Wt 166.4 lb

## 2020-02-05 DIAGNOSIS — E119 Type 2 diabetes mellitus without complications: Secondary | ICD-10-CM | POA: Diagnosis not present

## 2020-02-05 LAB — POCT GLYCOSYLATED HEMOGLOBIN (HGB A1C): Hemoglobin A1C: 7.7 % — AB (ref 4.0–5.6)

## 2020-02-05 NOTE — Patient Instructions (Addendum)
check your blood sugar once a day.  vary the time of day when you check, between before the 3 meals, and at bedtime.  also check if you have symptoms of your blood sugar being too high or too low.  please keep a record of the readings and bring it to your next appointment here (or you can bring the meter itself).  You can write it on any piece of paper.  please call us sooner if your blood sugar goes below 70, or if you have a lot of readings over 200.   Please continue the same medications.  Please come back for a follow-up appointment in 4 months.    

## 2020-02-05 NOTE — Progress Notes (Signed)
Subjective:    Patient ID: [name] White, female    DOB: 02-10-1941, 79 y.o.   MRN: [id number]  HPI Pt returns for f/u of diabetes mellitus:  DM type: 2 Dx'ed: 1999, after an episode of pancreatitis (no pancreatitis since then).   Complications: PN Therapy: 3 oral meds + Victoza.  GDM: never DKA: never Severe hypoglycemia: never.    Pancreatic Imaging (2002): recurrent pancreatitis.  Other: she has never taken insulin; she cannot take bromocriptine, due to an interaction with maxalt; pioglitizone has caused edema; fructosamine has converted to lower A1c than A1c itself Interval history: no cbg record, but states cbg varies from 120-160.  pt states she feels well in general.  She takes insulin as rx'ed. Past Medical History:  Diagnosis Date  . Anxiety   . Asthma   . ASYMPTOMATIC POSTMENOPAUSAL STATUS 02/09/2009  . Cataract   . Constipation 03/19/2013  . Depression   . DIABETES MELLITUS, TYPE II 09/14/2007  . HYPERCHOLESTEROLEMIA 02/09/2009  . HYPERTENSION 02/09/2009  . HYPOTHYROIDISM 09/14/2007  . MIGRAINE HEADACHE 09/14/2007  . Neuropathy   . OSTEOPOROSIS 02/09/2009  . PANCREATITIS 09/14/2007  . 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 PONV (postoperative nausea and vomiting)   . Rectocele 03/19/2013  . Shortness of breath   . SUPERFICIAL PHLEBITIS 09/14/2007  . Varicose veins     Past Surgical History:  Procedure Laterality Date  . ABDOMINAL HYSTERECTOMY    . ANTERIOR AND POSTERIOR REPAIR N/A 04/02/2013   Procedure: ANTERIOR (CYSTOCELE) AND POSTERIOR REPAIR (RECTOCELE);  Surgeon: Jonnie Kind, MD;  Location: [facility];  Service: Gynecology;  Laterality: N/A;  . APPENDECTOMY    . BRAIN SURGERY    . CATARACT EXTRACTION W/PHACO  12/05/2011   Procedure: CATARACT EXTRACTION PHACO AND INTRAOCULAR LENS PLACEMENT (IOC);  Surgeon: Tonny Branch;  Location: [facility];  Service: Ophthalmology;  Laterality: Left;  CDE:9.65  . CHOLECYSTECTOMY    . CYSTOSCOPY    . ENDOVENOUS ABLATION SAPHENOUS VEIN W/  LASER  11-03-2011   right greater saphenous vein   left leg done 10-2011  . EYE SURGERY  98   right cataract extraction 98  . LAPAROSCOPIC NISSEN FUNDOPLICATION    . NM ESOPHAGEAL REFLUX  08-11-11  . PITUITARY EXCISION  10/1997  . POSTERIOR REPAIR    . TRANSPHENOIDAL / TRANSNASAL HYPOPHYSECTOMY / RESECTION PITUITARY TUMOR  08-11-11    Social History   Socioeconomic History  . Marital status: Married    Spouse name: Not on file  . Number of children: Not on file  . Years of education: Not on file  . Highest education level: Not on file  Occupational History  . Occupation: Retired    Fish farm manager: RETIRED  Tobacco Use  . Smoking status: Never Smoker  . Smokeless tobacco: Never Used  Substance and Sexual Activity  . Alcohol use: No    Alcohol/week: 0.0 standard drinks  . Drug use: No  . Sexual activity: Yes    Birth control/protection: Surgical  Other Topics Concern  . Not on file  Social History Narrative  . Not on file   Social Determinants of Health   Financial Resource Strain:   . Difficulty of Paying Living Expenses: Not on file  Food Insecurity:   . Worried About Charity fundraiser in the Last Year: Not on file  . Ran Out of Food in the Last Year: Not on file  Transportation Needs:   . Lack of Transportation (Medical): Not on file  . Lack of  Transportation (Non-Medical): Not on file  Physical Activity:   . Days of Exercise per Week: Not on file  . Minutes of Exercise per Session: Not on file  Stress:   . Feeling of Stress : Not on file  Social Connections:   . Frequency of Communication with Friends and Family: Not on file  . Frequency of Social Gatherings with Friends and Family: Not on file  . Attends Religious Services: Not on file  . Active Member of Clubs or Organizations: Not on file  . Attends Archivist Meetings: Not on file  . Marital Status: Not on file  Intimate Partner Violence:   . Fear of Current or Ex-Partner: Not on file  .  Emotionally Abused: Not on file  . Physically Abused: Not on file  . Sexually Abused: Not on file    Current Outpatient Medications on File Prior to Visit  Medication Sig Dispense Refill  . ACCU-CHEK AVIVA PLUS test strip TEST TWICE DAILY. 100 strip 0  . albuterol (PROVENTIL HFA;VENTOLIN HFA) 108 (90 BASE) MCG/ACT inhaler Inhale 2 puffs into the lungs every 4 (four) hours as needed.      Marland Kitchen aspirin 81 MG tablet Take 81 mg by mouth daily.    Marland Kitchen atorvastatin (LIPITOR) 40 MG tablet TAKE ONE (1) TABLET BY MOUTH EVERY DAY 90 tablet 3  . baclofen (LIORESAL) 10 MG tablet Take 10 mg by mouth daily.    Marland Kitchen BREO ELLIPTA 200-25 MCG/INH AEPB     . carvedilol (COREG) 3.125 MG tablet TAKE ONE TABLET BY MOUTH TWICE A DAY 180 tablet 3  . cycloSPORINE (RESTASIS) 0.05 % ophthalmic emulsion Place 1 drop into both eyes 2 (two) times daily.      . dapagliflozin propanediol (FARXIGA) 5 MG TABS tablet Take 2.5 mg by mouth daily before breakfast. 15 tablet 11  . estradiol (ESTRACE) 1 MG tablet TAKE ONE TABLET (1MG  TOTAL) BY MOUTH DAILY 90 tablet 1  . furosemide (LASIX) 20 MG tablet May take Lasix 20 mg as NEEDED for leg swelling 45 tablet 3  . Hydrocodone-Acetaminophen 2.5-325 MG TABS Take 5-325 mg by mouth as needed.    . Insulin Pen Needle (NOVOTWIST) 32G X 5 MM MISC Use as directed once a day.   V250.00 100 each 6  . levothyroxine (SYNTHROID) 50 MCG tablet Take 1 tablet (50 mcg total) by mouth daily before breakfast. 90 tablet 2  . liraglutide (VICTOZA) 18 MG/3ML SOPN Inject 1.8 mg into the skin daily.    Marland Kitchen lisinopril (PRINIVIL,ZESTRIL) 5 MG tablet Take 5 mg by mouth every morning.      . metFORMIN (GLUCOPHAGE-XR) 500 MG 24 hr tablet TAKE TWO (2) TABLETS BY MOUTH DAILY 180 tablet 3  . Multiple Vitamins-Minerals (MULTIVITAMINS THER. W/MINERALS) TABS Take 1 tablet by mouth every morning.      Marland Kitchen omeprazole (PRILOSEC) 20 MG capsule Take 20 mg by mouth every morning.     . pregabalin (LYRICA) 150 MG capsule Take 150  mg by mouth 2 (two) times daily.     . repaglinide (PRANDIN) 1 MG tablet TAKE 1 TABLET BY MOUTH WITH BREAKFAST, 4 TABLETS WITH TABLETS WITH LUNCH, AND 4TABLETS WITH SUPPER 810 tablet 3  . rizatriptan (MAXALT-MLT) 10 MG disintegrating tablet Take 10 mg by mouth daily as needed. May repeat in 2 hours if needed    . venlafaxine (EFFEXOR-XR) 150 MG 24 hr capsule Take 150 mg by mouth every morning.      No current facility-administered medications on  file prior to visit.    Allergies  Allergen Reactions  . Betadine [Povidone Iodine] Hives  . Penicillins Rash    Family History  Problem Relation Age of Onset  . Heart disease Mother   . Asthma Mother   . COPD Father   . Heart disease Father   . Stroke Father   . Asthma Daughter   . Migraines Daughter   . Neuropathy Son   . Cancer Neg Hx   . Anesthesia problems Neg Hx   . Hypotension Neg Hx   . Malignant hyperthermia Neg Hx   . Pseudochol deficiency Neg Hx     BP 104/60 (BP Location: Left Arm, Patient Position: Sitting, Cuff Size: Normal)   Pulse 96   Ht 5\' 4"  (1.626 m)   Wt 166 lb 6.4 oz (75.5 kg)   SpO2 97%   BMI 28.56 kg/m    Review of Systems She denies hypoglycemia and nausea.      Objective:   Physical Exam VITAL SIGNS:  See vs page GENERAL: no distress Pulses: dorsalis pedis intact bilat.   MSK: no deformity of the feet CV: trace bilat leg edema, and bilat vv's Skin:  no ulcer on the feet.  normal color and temp on the feet. Neuro: sensation is intact to touch on the feet, but decreased from normal Ext: there is bilateral onychomycosis of the toenails   Lab Results  Component Value Date   HGBA1C 7.7 (A) 02/05/2020   Lab Results  Component Value Date   CREATININE 0.66 07/22/2019   BUN 13 07/22/2019   NA 138 07/22/2019   K 4.3 07/22/2019   CL 103 07/22/2019   CO2 24 07/22/2019       Assessment & Plan:  Type 2 DM: well-controlled.  Edema: This limits rx options  Patient Instructions  check your  blood sugar once a day.  vary the time of day when you check, between before the 3 meals, and at bedtime.  also check if you have symptoms of your blood sugar being too high or too low.  please keep a record of the readings and bring it to your next appointment here (or you can bring the meter itself).  You can write it on any piece of paper.  please call us sooner if your blood sugar goes below 70, or if you have a lot of readings over 200. Please continue the same medications.   Please come White for a follow-up appointment in 4 months.

## 2020-02-06 ENCOUNTER — Ambulatory Visit (HOSPITAL_COMMUNITY)
Admission: RE | Admit: 2020-02-06 | Discharge: 2020-02-06 | Disposition: A | Discharge status: Home / Self Care | Payer: Medicare Other | Source: Ambulatory Visit | Attending: Family Medicine | Admitting: Family Medicine

## 2020-02-06 ENCOUNTER — Other Ambulatory Visit (HOSPITAL_COMMUNITY)
Admission: RE | Admit: 2020-02-06 | Discharge: 2020-02-06 | Disposition: A | Discharge status: Home / Self Care | Payer: Medicare Other | Source: Ambulatory Visit | Attending: Family Medicine | Admitting: Family Medicine

## 2020-02-06 DIAGNOSIS — Z780 Asymptomatic menopausal state: Secondary | ICD-10-CM | POA: Insufficient documentation

## 2020-02-06 DIAGNOSIS — Z13820 Encounter for screening for osteoporosis: Secondary | ICD-10-CM | POA: Diagnosis not present

## 2020-02-06 DIAGNOSIS — Z1231 Encounter for screening mammogram for malignant neoplasm of breast: Secondary | ICD-10-CM | POA: Insufficient documentation

## 2020-02-06 DIAGNOSIS — M810 Age-related osteoporosis without current pathological fracture: Secondary | ICD-10-CM | POA: Diagnosis not present

## 2020-02-06 DIAGNOSIS — R42 Dizziness and giddiness: Secondary | ICD-10-CM | POA: Diagnosis not present

## 2020-02-06 DIAGNOSIS — I10 Essential (primary) hypertension: Secondary | ICD-10-CM | POA: Insufficient documentation

## 2020-02-06 LAB — CBC WITH DIFFERENTIAL/PLATELET
Abs Immature Granulocytes: 0.04 10*3/uL (ref 0.00–0.07)
Basophils Absolute: 0.1 10*3/uL (ref 0.0–0.1)
Basophils Relative: 1 %
Eosinophils Absolute: 0.5 10*3/uL (ref 0.0–0.5)
Eosinophils Relative: 5 %
HCT: 42.2 % (ref 36.0–46.0)
Hemoglobin: 13.2 g/dL (ref 12.0–15.0)
Immature Granulocytes: 0 %
Lymphocytes Relative: 23 %
Lymphs Abs: 2.3 10*3/uL (ref 0.7–4.0)
MCH: 29.9 pg (ref 26.0–34.0)
MCHC: 31.3 g/dL (ref 30.0–36.0)
MCV: 95.5 fL (ref 80.0–100.0)
Monocytes Absolute: 0.7 10*3/uL (ref 0.1–1.0)
Monocytes Relative: 7 %
Neutro Abs: 6.6 10*3/uL (ref 1.7–7.7)
Neutrophils Relative %: 64 %
Platelets: 243 10*3/uL (ref 150–400)
RBC: 4.42 MIL/uL (ref 3.87–5.11)
RDW: 13.6 % (ref 11.5–15.5)
WBC: 10.2 10*3/uL (ref 4.0–10.5)
nRBC: 0 % (ref 0.0–0.2)

## 2020-02-06 LAB — BASIC METABOLIC PANEL
Anion gap: 7 (ref 5–15)
BUN: 17 mg/dL (ref 8–23)
CO2: 26 mmol/L (ref 22–32)
Calcium: 8.8 mg/dL — ABNORMAL LOW (ref 8.9–10.3)
Chloride: 103 mmol/L (ref 98–111)
Creatinine, Ser: 0.74 mg/dL (ref 0.44–1.00)
GFR calc Af Amer: >60 mL/min (ref >60)
GFR calc non Af Amer: >60 mL/min (ref >60)
Glucose, Bld: 325 mg/dL — ABNORMAL HIGH (ref 70–99)
Potassium: 4 mmol/L (ref 3.5–5.1)
Sodium: 136 mmol/L (ref 135–145)

## 2020-02-06 LAB — VITAMIN D 25 HYDROXY (VIT D DEFICIENCY, FRACTURES): Vit D, 25-Hydroxy: 33.1 ng/mL (ref 30–100)

## 2020-02-10 ENCOUNTER — Telehealth: Payer: Self-pay | Admitting: Family Medicine

## 2020-02-10 ENCOUNTER — Other Ambulatory Visit (HOSPITAL_COMMUNITY): Payer: Self-pay | Admitting: Family Medicine

## 2020-02-10 DIAGNOSIS — R928 Other abnormal and inconclusive findings on diagnostic imaging of breast: Secondary | ICD-10-CM

## 2020-02-10 NOTE — Telephone Encounter (Signed)
Patient returning phone call to Dr. Holly Bodily. [phone]

## 2020-02-12 NOTE — Progress Notes (Signed)
Patient notified of the results, verbalized understanding.

## 2020-02-17 ENCOUNTER — Encounter: Payer: Self-pay | Admitting: Family Medicine

## 2020-02-18 ENCOUNTER — Other Ambulatory Visit: Payer: Self-pay

## 2020-02-18 ENCOUNTER — Ambulatory Visit (HOSPITAL_COMMUNITY): Admission: RE | Admit: 2020-02-18 | Payer: Medicare Other | Source: Ambulatory Visit

## 2020-02-18 ENCOUNTER — Ambulatory Visit (HOSPITAL_COMMUNITY)
Admission: RE | Admit: 2020-02-18 | Discharge: 2020-02-18 | Disposition: A | Discharge status: Home / Self Care | Payer: Medicare Other | Source: Ambulatory Visit | Attending: Family Medicine | Admitting: Family Medicine

## 2020-02-18 DIAGNOSIS — R928 Other abnormal and inconclusive findings on diagnostic imaging of breast: Secondary | ICD-10-CM | POA: Diagnosis not present

## 2020-02-19 ENCOUNTER — Other Ambulatory Visit: Payer: Self-pay | Admitting: Obstetrics and Gynecology

## 2020-02-20 ENCOUNTER — Telehealth (HOSPITAL_COMMUNITY): Payer: Self-pay

## 2020-02-20 NOTE — Telephone Encounter (Signed)
pt called to cancel this appt on monday no reason given lmonvm

## 2020-02-24 ENCOUNTER — Ambulatory Visit (HOSPITAL_COMMUNITY): Payer: Medicare Other

## 2020-03-11 ENCOUNTER — Telehealth: Payer: Self-pay | Admitting: Endocrinology

## 2020-03-11 NOTE — Telephone Encounter (Signed)
Please advise 

## 2020-03-11 NOTE — Telephone Encounter (Signed)
Please forward refill request to pt's primary care provider.   

## 2020-03-11 NOTE — Telephone Encounter (Signed)
MEDICATION: Lyrica  PHARMACY:   [place], [place] - [address] Phone:  [phone]  Fax:  [phone]     IS THIS A 90 DAY SUPPLY : no - 30  IS PATIENT OUT OF MEDICATION: no  IF NOT; HOW MUCH IS LEFT: "a few more days"  LAST APPOINTMENT DATE: 02/05/2020  NEXT APPOINTMENT DATE: 06/15/2020  DO WE HAVE YOUR PERMISSION TO LEAVE A DETAILED MESSAGE: yes  OTHER COMMENTS: Patient called stating the prescribing Dr has retired and is in between Dr's right now - she is almost out and is asking if Dr Loanne Drilling is able to call this in. Patient ph# [phone].   **Let patient know to contact pharmacy at the end of the day to make sure medication is ready. **  ** Please notify patient to allow 48-72 hours to process**  **Encourage patient to contact the pharmacy for refills or they can request refills through [facility]**

## 2020-03-12 NOTE — Telephone Encounter (Signed)
Per Dr. Ellison's request, I am forwarding this refill request. Please review and refill if appropriate.  

## 2020-03-12 NOTE — Telephone Encounter (Signed)
Patient stated you all discuss this rx with you at the last visit. Patient stated if this is something you do not perscribe there is no need for her to come into the office . Patient stated she went 1 day without this med and her legs about kill her.

## 2020-03-12 NOTE — Telephone Encounter (Signed)
Pt would need an appointment to discuss this medication-controlled substance

## 2020-03-12 NOTE — Telephone Encounter (Signed)
Pt is seeing an endocrinologist. If pt is taking medication for peripheral neuropathy related to diabetes verses another diagnosis. Since the patient sees an endocrinologist we need to determine why she takes the medication. Lyrica is a controlled substance and an agreement with the provider is signed to obtain this medication. I will be happy to see the patient for discussion

## 2020-03-16 NOTE — Telephone Encounter (Signed)
Please advise on refill until first appt in April with DR Nevada Crane

## 2020-03-16 NOTE — Telephone Encounter (Signed)
Unfortunately we will not be able to fill this medication.  Lyrica is a controlled substance.

## 2020-03-16 NOTE — Telephone Encounter (Signed)
Left a msg for patient to return call

## 2020-03-16 NOTE — Telephone Encounter (Signed)
Patient stated she spoke with Endo and they do not fill this med and she was taking this med for leg pain not sure if it neuropathy or not but it helps with the leg pain. Patient did get an appt with Dr Nevada Crane but will not see him  until April need a refill until can get in to see him

## 2020-03-16 NOTE — Telephone Encounter (Signed)
Patient was informed this med she is requesting is a med that she do not refill

## 2020-03-17 ENCOUNTER — Telehealth: Payer: Self-pay | Admitting: *Deleted

## 2020-03-17 NOTE — Telephone Encounter (Signed)
Patient is requesting a one month refill on her Lyrica 150mg  BID be sent to [facility].  She is scheduled to see Dr Nevada Crane in mid-April since Dr Holly Bodily is moving.

## 2020-03-18 ENCOUNTER — Other Ambulatory Visit: Payer: Self-pay | Admitting: Obstetrics and Gynecology

## 2020-03-18 MED ORDER — PREGABALIN 150 MG PO CAPS: 150.0000 mg | ORAL_CAPSULE | Freq: Two times a day (BID) | ORAL | 2 refills | Status: DC

## 2020-03-18 NOTE — Progress Notes (Signed)
Lyrica refilled at 150 mg bid til pt can transfer care to Dr Nevada Crane.

## 2020-04-02 ENCOUNTER — Emergency Department (HOSPITAL_COMMUNITY): Payer: Medicare Other

## 2020-04-02 ENCOUNTER — Ambulatory Visit
Admission: EM | Admit: 2020-04-02 | Discharge: 2020-04-02 | Disposition: A | Discharge status: Home / Self Care | Payer: Medicare Other | Source: Home / Self Care

## 2020-04-02 ENCOUNTER — Other Ambulatory Visit: Payer: Self-pay

## 2020-04-02 ENCOUNTER — Encounter: Payer: Self-pay | Admitting: Emergency Medicine

## 2020-04-02 ENCOUNTER — Encounter (HOSPITAL_COMMUNITY): Payer: Self-pay | Admitting: *Deleted

## 2020-04-02 ENCOUNTER — Emergency Department (HOSPITAL_COMMUNITY)
Admission: EM | Admit: 2020-04-02 | Discharge: 2020-04-02 | Disposition: A | Discharge status: Home / Self Care | Payer: Medicare Other | Attending: Emergency Medicine | Admitting: Emergency Medicine

## 2020-04-02 DIAGNOSIS — Y92481 Parking lot as the place of occurrence of the external cause: Secondary | ICD-10-CM | POA: Diagnosis not present

## 2020-04-02 DIAGNOSIS — K429 Umbilical hernia without obstruction or gangrene: Secondary | ICD-10-CM | POA: Insufficient documentation

## 2020-04-02 DIAGNOSIS — Z79899 Other long term (current) drug therapy: Secondary | ICD-10-CM | POA: Diagnosis not present

## 2020-04-02 DIAGNOSIS — W010XXA Fall on same level from slipping, tripping and stumbling without subsequent striking against object, initial encounter: Secondary | ICD-10-CM | POA: Insufficient documentation

## 2020-04-02 DIAGNOSIS — E039 Hypothyroidism, unspecified: Secondary | ICD-10-CM | POA: Insufficient documentation

## 2020-04-02 DIAGNOSIS — Y939 Activity, unspecified: Secondary | ICD-10-CM | POA: Diagnosis not present

## 2020-04-02 DIAGNOSIS — S3991XA Unspecified injury of abdomen, initial encounter: Secondary | ICD-10-CM | POA: Diagnosis not present

## 2020-04-02 DIAGNOSIS — E119 Type 2 diabetes mellitus without complications: Secondary | ICD-10-CM | POA: Diagnosis not present

## 2020-04-02 DIAGNOSIS — E236 Other disorders of pituitary gland: Secondary | ICD-10-CM

## 2020-04-02 DIAGNOSIS — D3502 Benign neoplasm of left adrenal gland: Secondary | ICD-10-CM

## 2020-04-02 DIAGNOSIS — Z7982 Long term (current) use of aspirin: Secondary | ICD-10-CM | POA: Insufficient documentation

## 2020-04-02 DIAGNOSIS — I10 Essential (primary) hypertension: Secondary | ICD-10-CM | POA: Insufficient documentation

## 2020-04-02 DIAGNOSIS — Z7984 Long term (current) use of oral hypoglycemic drugs: Secondary | ICD-10-CM | POA: Insufficient documentation

## 2020-04-02 DIAGNOSIS — M533 Sacrococcygeal disorders, not elsewhere classified: Secondary | ICD-10-CM | POA: Diagnosis not present

## 2020-04-02 DIAGNOSIS — S3992XA Unspecified injury of lower back, initial encounter: Secondary | ICD-10-CM | POA: Diagnosis not present

## 2020-04-02 DIAGNOSIS — S39012A Strain of muscle, fascia and tendon of lower back, initial encounter: Secondary | ICD-10-CM | POA: Diagnosis not present

## 2020-04-02 DIAGNOSIS — R310 Gross hematuria: Secondary | ICD-10-CM | POA: Diagnosis not present

## 2020-04-02 DIAGNOSIS — Y999 Unspecified external cause status: Secondary | ICD-10-CM | POA: Insufficient documentation

## 2020-04-02 DIAGNOSIS — S0990XA Unspecified injury of head, initial encounter: Secondary | ICD-10-CM | POA: Diagnosis not present

## 2020-04-02 DIAGNOSIS — J45909 Unspecified asthma, uncomplicated: Secondary | ICD-10-CM | POA: Insufficient documentation

## 2020-04-02 MED ORDER — ONDANSETRON 4 MG PO TBDP
4.0000 mg | ORAL_TABLET | Freq: Once | ORAL | Status: AC
  Administered 2020-04-02: 16:00:00 4 mg via ORAL
  Filled 2020-04-02: qty 1

## 2020-04-02 MED ORDER — TRAMADOL HCL 50 MG PO TABS: 50.0000 mg | ORAL_TABLET | Freq: Four times a day (QID) | ORAL | 0 refills | Status: DC | PRN

## 2020-04-02 MED ORDER — TRAMADOL HCL 50 MG PO TABS
50.0000 mg | ORAL_TABLET | Freq: Once | ORAL | Status: AC
  Administered 2020-04-02: 50 mg via ORAL
  Filled 2020-04-02: qty 1

## 2020-04-02 MED ORDER — ACETAMINOPHEN 325 MG PO TABS
650.0000 mg | ORAL_TABLET | Freq: Once | ORAL | Status: AC
  Administered 2020-04-02: 650 mg via ORAL
  Filled 2020-04-02: qty 2

## 2020-04-02 MED ORDER — METHOCARBAMOL 500 MG PO TABS: 500.0000 mg | ORAL_TABLET | Freq: Two times a day (BID) | ORAL | 0 refills | Status: DC

## 2020-04-02 NOTE — ED Notes (Signed)
Patient is being discharged from the [facility]gent [facility] and sent to the Emergency Department via personal verhicle. Per [place], patient is stable but in need of higher level of care due to needing further imaging. Patient is aware and verbalizes understanding of plan of care.  Vitals:   04/02/20 1147  BP: 126/84  Pulse: 92  Resp: 18  Temp: 97.8 F (36.6 C)  SpO2: 94%   Report called to [facility].

## 2020-04-02 NOTE — ED Provider Notes (Signed)
Medical screening examination/treatment/procedure(s) were conducted as a shared visit with non-physician practitioner(s) and myself.  I personally evaluated the patient during the encounter.      Patient seen by me along with physician assistant.  Patient with a fall 2 days ago.  Tach she took care of her husband earlier this morning.  Had a fall this morning.  Patient hit her head was at urgent care they sent her here for head CT.  Apparently after the fall there was a large goose egg on the left side of her head.  Patient also with low lumbar and pelvic pain.  X-rays of those areas without any acute bony abnormality but on exam very concerning.  Physician assistance, going to CT the lumbar area in the pelvic area by doing just CT abdomen and pelvis without contrast.  And will CT the head since she was sent here by urgent care for that to be evaluated.  If CT shows no lumbar or pelvic fractures and patient can be treated symptomatically.     Fredia Sorrow, MD 04/02/20 [phone]

## 2020-04-02 NOTE — ED Provider Notes (Signed)
[facility] EMERGENCY DEPARTMENT Provider Note   CSN: [id number] Arrival date & time: 04/02/20  1214     History Chief Complaint  Patient presents with  . Fall    [name] White is a 79 y.o. female presents emergency department chief complaint of lower back pain after fall.  Patient fell in the parking lot of [facility] 2 days ago.  She lost her footing, fell toward the left side.  She did hit her head.  She does not take blood thinners.  She had a little bit of tenderness on her scalp, applied an ice pack and has not had much pain in her head or any neurologic complaints since that time.  She does have severe pain in her sacral region.  She states that she is able to walk but it is extremely Difficult for her to get into a standing position.  She also has severe pain whenever she tries to change positions at all.  She denies any numbness, tingling, weakness of the lower extremities.  She denies saddle anesthesia.  She denies hematuria.  HPI     Past Medical History:  Diagnosis Date  . Anxiety   . Asthma   . ASYMPTOMATIC POSTMENOPAUSAL STATUS 02/09/2009  . Cataract   . Constipation 03/19/2013  . Depression   . DIABETES MELLITUS, TYPE II 09/14/2007  . HYPERCHOLESTEROLEMIA 02/09/2009  . HYPERTENSION 02/09/2009  . HYPOTHYROIDISM 09/14/2007  . MIGRAINE HEADACHE 09/14/2007  . Neuropathy   . OSTEOPOROSIS 02/09/2009  . PANCREATITIS 09/14/2007  . 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 PONV (postoperative nausea and vomiting)   . Rectocele 03/19/2013  . Shortness of breath   . SUPERFICIAL PHLEBITIS 09/14/2007  . Varicose veins     Patient Active Problem List   Diagnosis Date Noted  . Dizzinesses 02/02/2020  . Acute pain of right shoulder 02/02/2020  . Encounter for screening mammogram for malignant neoplasm of breast 01/28/2020  . Vitamin D deficiency 01/28/2020  . Paroxysmal supraventricular tachycardia ([place]) 06/19/2014  . Rectocele 03/19/2013  . Constipation 03/19/2013  .  HYPERCHOLESTEROLEMIA 02/09/2009  . Essential hypertension 02/09/2009  . Osteoporosis 02/09/2009  . Post-menopausal 02/09/2009  . HEAT INTOLERANCE 02/01/2008  . 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 Hypothyroidism 09/14/2007  . Diabetes ([place]) 09/14/2007  . Migraine headache 09/14/2007  . SUPERFICIAL PHLEBITIS 09/14/2007  . PANCREATITIS 09/14/2007  . MENOPAUSAL SYNDROME 09/14/2007  . FATIGUE 09/14/2007    Past Surgical History:  Procedure Laterality Date  . ABDOMINAL HYSTERECTOMY    . ANTERIOR AND POSTERIOR REPAIR N/A 04/02/2013   Procedure: ANTERIOR (CYSTOCELE) AND POSTERIOR REPAIR (RECTOCELE);  Surgeon: Jonnie Kind, MD;  Location: [facility] ORS;  Service: Gynecology;  Laterality: N/A;  . APPENDECTOMY    . BRAIN SURGERY    . CATARACT EXTRACTION W/PHACO  12/05/2011   Procedure: CATARACT EXTRACTION PHACO AND INTRAOCULAR LENS PLACEMENT (IOC);  Surgeon: Tonny Branch;  Location: [facility] OR[facility];  Service: Ophthalmology;  Laterality: Left;  CDE:9.65  . CHOLECYSTECTOMY    . CYSTOSCOPY    . ENDOVENOUS ABLATION SAPHENOUS VEIN W/ LASER  11-03-2011   right greater saphenous vein   left leg done 10-2011  . EYE SURGERY  98   right cataract extraction 98  . LAPAROSCOPIC NISSEN FUNDOPLICATION    . NM ESOPHAGEAL REFLUX  08-11-11  . PITUITARY EXCISION  10/1997  . POSTERIOR REPAIR    . TRANSPHENOIDAL / TRANSNASAL HYPOPHYSECTOMY / RESECTION PITUITARY TUMOR  08-11-11     OB History   No obstetric history on file.  Family History  Problem Relation Age of Onset  . Heart disease Mother   . Asthma Mother   . COPD Father   . Heart disease Father   . Stroke Father   . Asthma Daughter   . Migraines Daughter   . Neuropathy Son   . Cancer Neg Hx   . Anesthesia problems Neg Hx   . Hypotension Neg Hx   . Malignant hyperthermia Neg Hx   . Pseudochol deficiency Neg Hx     Social History   Tobacco Use  . Smoking status: Never Smoker  . Smokeless tobacco: Never Used  Substance Use Topics  . Alcohol use: No     Alcohol/week: 0.0 standard drinks  . Drug use: No    Home Medications Prior to Admission medications   Medication Sig Start Date End Date Taking? Authorizing Provider  ACCU-CHEK AVIVA PLUS test strip TEST TWICE DAILY. 01/14/20   Corum, Rex Kras, MD  albuterol (PROVENTIL HFA;VENTOLIN HFA) 108 (90 BASE) MCG/ACT inhaler Inhale 2 puffs into the lungs every 4 (four) hours as needed.      [provider]  aspirin 81 MG tablet Take 81 mg by mouth daily.    [provider]  atorvastatin (LIPITOR) 40 MG tablet TAKE ONE (1) TABLET BY MOUTH EVERY DAY 08/14/19   Arnoldo Lenis, MD  baclofen (LIORESAL) 10 MG tablet Take 10 mg by mouth daily.    [provider]  Adair Patter [phone] MCG/INH AEPB  09/08/18   [provider]  carvedilol (COREG) 3.125 MG tablet TAKE ONE TABLET BY MOUTH TWICE A DAY 05/29/19   Arnoldo Lenis, MD  cycloSPORINE (RESTASIS) 0.05 % ophthalmic emulsion Place 1 drop into both eyes 2 (two) times daily.      [provider]  dapagliflozin propanediol (FARXIGA) 5 MG TABS tablet Take 2.5 mg by mouth daily before breakfast. 12/26/19   Renato Shin, MD  estradiol (ESTRACE) 1 MG tablet TAKE ONE TABLET (1MG  TOTAL) BY MOUTH DAILY 02/19/20   Jonnie Kind, MD  furosemide (LASIX) 20 MG tablet May take Lasix 20 mg as NEEDED for leg swelling 03/13/15   Arnoldo Lenis, MD  Hydrocodone-Acetaminophen 2.5-325 MG TABS Take 5-325 mg by mouth as needed.    [provider]  Insulin Pen Needle (NOVOTWIST) 32G X 5 MM MISC Use as directed once a day.   V250.00 10/18/12   Renato Shin, MD  levothyroxine (SYNTHROID) 50 MCG tablet Take 1 tablet (50 mcg total) by mouth daily before breakfast. 09/11/19   Renato Shin, MD  liraglutide (VICTOZA) 18 MG/3ML SOPN Inject 1.8 mg into the skin daily.    [provider]  lisinopril (PRINIVIL,ZESTRIL) 5 MG tablet Take 5 mg by mouth every morning.      [provider]  metFORMIN  (GLUCOPHAGE-XR) 500 MG 24 hr tablet TAKE TWO (2) TABLETS BY MOUTH DAILY 11/27/19   Renato Shin, MD  methocarbamol (ROBAXIN) 500 MG tablet Take 1 tablet (500 mg total) by mouth 2 (two) times daily. 04/02/20   Margarita Mail, PA-C  Multiple Vitamins-Minerals (MULTIVITAMINS THER. W/MINERALS) TABS Take 1 tablet by mouth every morning.      [provider]  omeprazole (PRILOSEC) 20 MG capsule Take 20 mg by mouth every morning.     [provider]  pregabalin (LYRICA) 150 MG capsule Take 1 capsule (150 mg total) by mouth 2 (two) times daily. 03/18/20   Jonnie Kind, MD  repaglinide (PRANDIN) 1 MG tablet TAKE 1  TABLET BY MOUTH WITH BREAKFAST, 4 TABLETS WITH TABLETS WITH LUNCH, AND 4TABLETS WITH SUPPER 10/02/19   Renato Shin, MD  rizatriptan (MAXALT-MLT) 10 MG disintegrating tablet Take 10 mg by mouth daily as needed. May repeat in 2 hours if needed    [provider]  traMADol (ULTRAM) 50 MG tablet Take 1 tablet (50 mg total) by mouth every 6 (six) hours as needed. 04/02/20   Margarita Mail, PA-C  venlafaxine (EFFEXOR-XR) 150 MG 24 hr capsule Take 150 mg by mouth every morning.     [provider]    Allergies    Betadine [povidone iodine] and Penicillins  Review of Systems   Review of Systems Ten systems reviewed and are negative for acute change, except as noted in the HPI.   Physical Exam Updated Vital Signs BP (!) 111/59   Pulse 98   Temp 98 F (36.7 C)   Resp 17   Ht 5\' 6"  (1.676 m)   Wt 74.8 kg   SpO2 93%   BMI 26.63 kg/m   Physical Exam Vitals and nursing note reviewed.  Constitutional:      General: She is not in acute distress.    Appearance: She is well-developed. She is not diaphoretic.  HENT:     Head: Normocephalic and atraumatic.  Eyes:     General: No scleral icterus.    Conjunctiva/sclera: Conjunctivae normal.  Cardiovascular:     Rate and Rhythm: Normal rate and regular rhythm.     Heart sounds: Normal heart sounds. No  murmur. No friction rub. No gallop.   Pulmonary:     Effort: Pulmonary effort is normal. No respiratory distress.     Breath sounds: Normal breath sounds.  Abdominal:     General: Bowel sounds are normal. There is no distension.     Palpations: Abdomen is soft. There is no mass.     Tenderness: There is no abdominal tenderness. There is no guarding.  Musculoskeletal:     Cervical back: Normal range of motion.     Comments: Normal strength with dorsi and plantarflexion at the ankle ankle.  Severe pain in the lower back with passive hip flexion unable to continue with examination.  No pain with internal and external rotation when legs are flat against the gurney.  No midline spinal tenderness on examination.  She is tender over the the sacrum and coccyx.  Tenderness along the right pubic ramus.  Skin:    General: Skin is warm and dry.  Neurological:     Mental Status: She is alert and oriented to person, place, and time.  Psychiatric:        Behavior: Behavior normal.     ED Results / Procedures / Treatments   Labs (all labs ordered are listed, but only abnormal results are displayed) Labs Reviewed - No data to display  EKG None  Radiology CT Abdomen Pelvis Wo Contrast  Result Date: 04/02/2020 CLINICAL DATA:  Abdominal trauma. Gross hematuria. Clinical concern for occult lumbar pelvic fracture after fall 2 days ago. EXAM: CT ABDOMEN AND PELVIS WITHOUT CONTRAST TECHNIQUE: Multidetector CT imaging of the abdomen and pelvis was performed following the standard protocol without IV contrast. COMPARISON:  Pelvis and lumbar radiographs earlier this day. FINDINGS: Lower chest: Mitral annulus calcifications. Normal heart size. No basilar pneumothorax, pleural fluid or consolidation. Included ribs are intact. Hepatobiliary: Evaluation for injury is limited in the absence of IV contrast. No focal hepatic abnormality or perihepatic hematoma. Clips in the gallbladder fossa  postcholecystectomy. No  biliary dilatation. Pancreas: Parenchymal atrophy. Calcification in the pancreatic head suggests chronic pancreatitis. No acute pancreatic inflammation. No ductal dilatation. Spleen: Evaluation for injury is limited in the absence of IV contrast. No focal abnormality. No perisplenic hematoma. Adrenals/Urinary Tract: 18 mm low-density left adrenal nodule is consistent with adenoma. No adrenal hemorrhage. No renal calculi. No hydronephrosis. No obvious renal injury or perinephric fluid collection allowing for lack of IV contrast. Small cyst in the posterior right kidney measures 13 mm. Low-density lesion in the posterior mid left kidney measures simple fluid density and is likely cyst, but incompletely characterized given size and lack of IV contrast. Urinary bladder is unremarkable. No bladder stone, wall thickening, or evidence of injury. No perivesicular fluid or stranding. Stomach/Bowel: No evidence of bowel injury or mesenteric hematoma. Surgical clips at the gastroesophageal junction. Small hiatal hernia. Stomach is unremarkable. No small bowel obstruction or inflammation. Small duodenal diverticulum. Appendix not confidently visualized, no evidence of appendicitis. Moderate volume of stool in the colon. Diverticulosis from the descending through the sigmoid. No diverticulitis. Vascular/Lymphatic: Aortic atherosclerosis. No retroperitoneal fluid to suggest vascular injury. No adenopathy. Reproductive: Status post hysterectomy. No adnexal masses. Other: No intra-abdominal free air or free fluid. Fat in both inguinal canals. Fat containing supraumbilical ventral abdominal wall hernia. Patchy subcutaneous edema in the upper anterior abdominal wall. No confluent hematoma in the subcutaneous tissues. Musculoskeletal: No acute fracture of the lumbar spine, sacrum/coccyx, or pelvis. Cortical margins of the pelvis are intact. Benign-appearing sclerotic lesion in the right proximal femur. Degenerative change of the  sacroiliac joints. Multilevel degenerative change in the lumbar spine. No confluent intramuscular hematoma on noncontrast exam. IMPRESSION: 1. No evidence of acute traumatic injury to the abdomen or pelvis allowing for lack of IV contrast. No explanation for hematuria. 2. No acute fracture of the lumbar spine, sacrum/coccyx, or pelvis. 3. Subcutaneous fat stranding in the upper abdomen is more typical of medication injection site than soft tissue contusion. 4. Incidental findings of colonic diverticulosis. Left adrenal adenoma. Fat containing supraumbilical ventral abdominal wall hernia. Aortic Atherosclerosis (ICD10-I70.0). Electronically Signed   By: Keith Rake M.D.   On: 04/02/2020 16:22   DG Lumbar Spine Complete  Result Date: 04/02/2020 CLINICAL DATA:  Pain following recent fall EXAM: LUMBAR SPINE - COMPLETE 4+ VIEW COMPARISON:  July 19, 2018 FINDINGS: Frontal, lateral, spot lumbosacral lateral, and bilateral oblique views were obtained. There are 5 non-rib-bearing lumbar type vertebral bodies. No evident fracture. There is stable grade I/IV anterolisthesis of L4 on L5. No other spondylolisthesis. There is mild disc space narrowing at L4-5. Other disc spaces appear unremarkable. There is facet osteoarthritic change at L4-5 and L5-S1 bilaterally. IMPRESSION: Stable slight osteoarthritic change in spondylolisthesis at L4-5. Facet osteoarthritic change noted at L4-5 and L5-S1 bilaterally. No fracture. No new spondylolisthesis. Electronically Signed   By: Lowella Grip III M.D.   On: 04/02/2020 14:08   DG Pelvis 1-2 Views  Result Date: 04/02/2020 CLINICAL DATA:  Pain following fall EXAM: PELVIS - 1-2 VIEW COMPARISON:  None. FINDINGS: There is no evidence of pelvic fracture or dislocation. There is slight symmetric narrowing of each hip joint. There is sclerosis in the right femoral head, probably adjacent bone islands. No erosive change. Sacroiliac joints appear normal bilaterally. There is  degenerative change in the pubic symphysis IMPRESSION: Mild symmetric narrowing of each hip joint. Degenerative change in pubic symphysis. No fracture or dislocation. Electronically Signed   By: Lowella Grip III M.D.   On: 04/02/2020  14:05   DG Sacrum/Coccyx  Result Date: 04/02/2020 CLINICAL DATA:  Pain following fall EXAM: SACRUM AND COCCYX - 2+ VIEW COMPARISON:  None. FINDINGS: Frontal, angled frontal, and lateral views were obtained. No fracture or diastasis. There is 3 mm of anterolisthesis of L4 on L5, likely due to spondylosis. No other spondylolisthesis evident. There is disc space narrowing at L4-5. The sacroiliac joints appear symmetric and normal bilaterally. No sacroiliitis. IMPRESSION: No fracture or diastasis. No appreciable sacroiliac joint arthropathy. Mild spondylolisthesis at L4-5, likely due to spondylosis. Electronically Signed   By: Lowella Grip III M.D.   On: 04/02/2020 14:06   CT Head Wo Contrast  Result Date: 04/02/2020 CLINICAL DATA:  79 year old female with head trauma. EXAM: CT HEAD WITHOUT CONTRAST TECHNIQUE: Contiguous axial images were obtained from the base of the skull through the vertex without intravenous contrast. COMPARISON:  None FINDINGS: Brain: There is mild age-related atrophy and moderate chronic microvascular ischemic changes. There is a 2.0 x 2.0 cm suprasellar mass with expansion of the sella. Further characterization with MRI without and with contrast is recommended. There is no acute intracranial hemorrhage. No mass effect or midline shift. No extra-axial fluid collection. Vascular: No hyperdense vessel or unexpected calcification. Skull: There is infiltrative changes of the base of the skull involving the colitis. There is a fracture of the anterior clivus posterior to the ethmoid air cells. Findings consistent with infiltration of the mass. Sinuses/Orbits: The visualized paranasal sinuses and mastoid air cells are clear. Other: None IMPRESSION: 1. No  acute intracranial hemorrhage. 2. Age-related atrophy and chronic microvascular ischemic changes. 3. 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 Findings may represent a mass arising from the pituitary gland or other lesions such as chondrosarcoma, chordoma, or lymphoma. Further characterization with MRI without and with contrast is recommended. Electronically Signed   By: Anner Crete M.D.   On: 04/02/2020 16:20    Procedures Procedures (including critical care time)  Medications Ordered in ED Medications  acetaminophen (TYLENOL) tablet 650 mg (650 mg Oral Given 04/02/20 1313)  traMADol (ULTRAM) tablet 50 mg (50 mg Oral Given 04/02/20 1615)  ondansetron (ZOFRAN-ODT) disintegrating tablet 4 mg (4 mg Oral Given 04/02/20 1615)    ED Course  I have reviewed the triage vital signs and the nursing notes.  Pertinent labs & imaging results that were available during my care of the patient were reviewed by me and considered in my medical decision making (see chart for details).    MDM Rules/Calculators/A&P                      CC: Fall, back pain, head injury VS:  Vitals:   04/02/20 1223 04/02/20 1225 04/02/20 1300 04/02/20 1717  BP:  92/66 107/64 (!) 111/59  Pulse:  75 74 98  Resp:  16  17  Temp:  (!) 97.5 F (36.4 C)  98 F (36.7 C)  TempSrc:  Oral    SpO2:  95% 94% 93%  Weight: 74.8 kg     Height: 5\' 6"  (1.676 m)       [id number] is gathered by patient and daughter at bedside. Previous records obtained and reviewed. DDX:The patient's complaint of head injury and back pain involves an extensive number of diagnostic and treatment options, and is a complaint that carries with it a high risk of complications, morbidity, and potential mortality. Given the large differential diagnosis, medical decision making is of high complexity.  Differential includes concussion, subdural hematoma, intracranial hemorrhage,  cord injury, fracture Labs:  Imaging: I ordered and reviewed images  which included plain film of the lumbar spine, sacrum and pelvis as well as further imaging of the head and CT abdomen pelvis without contrast. I independently visualized and interpreted all imaging. Significant findings include plain films showed no acute abnormalities.  CT abdomen pelvis showed an adrenal adenoma likely benign which I reviewed and spoke with the patient about.  CT head showed a pituitary mass..  EKG: Consults: MDM: 79 year old female here with mechanical fall.  Imaging negative for acute intracranial hemorrhage, subdural hematoma, acute fracture.  Back pain likely secondary to strain injury.  I discussed the pituitary mass finding with the patient.  Patient admits that she has had some decreased vision in the right eye.  She states that she has a previous history of pituitary mass which was operated on by Dr. Ellene Route many years ago and has noted the same changes in her vision which occurred last time she had the pituitary adenoma, but did not want to see anything because she was afraid it would upset her husband.  Daughter is at bedside and will help assist the patient in close follow-up with Dr. Ellene Route or her PCP.  She may need further imaging with MRI. Patient disposition: Discharge The patient appears reasonably screened and/or stabilized for discharge and I doubt any other medical condition or other [facility] requiring further screening, evaluation, or treatment in the ED at this time prior to discharge. I have discussed lab and/or imaging findings with the patient and answered all questions/concerns to the best of my ability.I have discussed return precautions and OP follow up.    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

## 2020-04-02 NOTE — ED Triage Notes (Signed)
Pt presents with C/O lower back pain after a fall on Tuesday of this week. Pt also reports hitting the back of her head. No bleeding noted. Denies LOC.

## 2020-04-02 NOTE — ED Triage Notes (Signed)
Pt with a fall with lower back pain,  Hit her head on cement with fall that occurred on Tuesday.  Pt denies any blood thinners.  Sent from urgent care

## 2020-04-02 NOTE — Discharge Instructions (Signed)
Get help right away if: Your symptoms suddenly become severe. You have a nosebleed that does not stop after a few minutes. You have a fever of over 101F (38.3C). You have a severe headache. You have a stiff neck. You are confused or not as alert as usual. You have chest pain. You have shortness of breath.

## 2020-04-08 DIAGNOSIS — E1165 Type 2 diabetes mellitus with hyperglycemia: Secondary | ICD-10-CM | POA: Diagnosis not present

## 2020-04-08 DIAGNOSIS — I10 Essential (primary) hypertension: Secondary | ICD-10-CM | POA: Diagnosis not present

## 2020-04-08 DIAGNOSIS — I509 Heart failure, unspecified: Secondary | ICD-10-CM | POA: Diagnosis not present

## 2020-04-08 DIAGNOSIS — Z0189 Encounter for other specified special examinations: Secondary | ICD-10-CM | POA: Diagnosis not present

## 2020-04-08 DIAGNOSIS — E785 Hyperlipidemia, unspecified: Secondary | ICD-10-CM | POA: Diagnosis not present

## 2020-05-01 ENCOUNTER — Other Ambulatory Visit (HOSPITAL_COMMUNITY): Payer: Self-pay | Admitting: Neurological Surgery

## 2020-05-01 ENCOUNTER — Other Ambulatory Visit: Payer: Self-pay | Admitting: Neurological Surgery

## 2020-05-01 DIAGNOSIS — D497 Neoplasm of unspecified behavior of endocrine glands and other parts of nervous system: Secondary | ICD-10-CM | POA: Diagnosis not present

## 2020-05-01 DIAGNOSIS — R2681 Unsteadiness on feet: Secondary | ICD-10-CM | POA: Diagnosis not present

## 2020-05-01 DIAGNOSIS — H539 Unspecified visual disturbance: Secondary | ICD-10-CM | POA: Diagnosis not present

## 2020-05-05 ENCOUNTER — Ambulatory Visit (HOSPITAL_COMMUNITY)
Admission: RE | Admit: 2020-05-05 | Discharge: 2020-05-05 | Disposition: A | Discharge status: Home / Self Care | Payer: Medicare Other | Source: Ambulatory Visit | Attending: Neurological Surgery | Admitting: Neurological Surgery

## 2020-05-05 ENCOUNTER — Other Ambulatory Visit: Payer: Self-pay

## 2020-05-05 DIAGNOSIS — D497 Neoplasm of unspecified behavior of endocrine glands and other parts of nervous system: Secondary | ICD-10-CM | POA: Diagnosis not present

## 2020-05-05 DIAGNOSIS — D352 Benign neoplasm of pituitary gland: Secondary | ICD-10-CM | POA: Diagnosis not present

## 2020-05-05 MED ORDER — GADOBUTROL 1 MMOL/ML IV SOLN
7.0000 mL | Freq: Once | INTRAVENOUS | Status: AC | PRN
  Administered 2020-05-05: 7 mL via INTRAVENOUS

## 2020-05-12 ENCOUNTER — Other Ambulatory Visit: Payer: Self-pay | Admitting: Endocrinology

## 2020-05-14 DIAGNOSIS — H5202 Hypermetropia, left eye: Secondary | ICD-10-CM | POA: Diagnosis not present

## 2020-05-14 DIAGNOSIS — H43813 Vitreous degeneration, bilateral: Secondary | ICD-10-CM | POA: Diagnosis not present

## 2020-05-14 DIAGNOSIS — D352 Benign neoplasm of pituitary gland: Secondary | ICD-10-CM | POA: Diagnosis not present

## 2020-05-14 DIAGNOSIS — H04123 Dry eye syndrome of bilateral lacrimal glands: Secondary | ICD-10-CM | POA: Diagnosis not present

## 2020-05-15 DIAGNOSIS — E1169 Type 2 diabetes mellitus with other specified complication: Secondary | ICD-10-CM | POA: Diagnosis not present

## 2020-05-15 DIAGNOSIS — E039 Hypothyroidism, unspecified: Secondary | ICD-10-CM | POA: Diagnosis not present

## 2020-05-15 DIAGNOSIS — Z0000 Encounter for general adult medical examination without abnormal findings: Secondary | ICD-10-CM | POA: Diagnosis not present

## 2020-05-15 DIAGNOSIS — I10 Essential (primary) hypertension: Secondary | ICD-10-CM | POA: Diagnosis not present

## 2020-05-20 ENCOUNTER — Other Ambulatory Visit: Payer: Self-pay | Admitting: Endocrinology

## 2020-05-20 DIAGNOSIS — D497 Neoplasm of unspecified behavior of endocrine glands and other parts of nervous system: Secondary | ICD-10-CM | POA: Diagnosis not present

## 2020-05-21 ENCOUNTER — Other Ambulatory Visit: Payer: Self-pay | Admitting: Neurological Surgery

## 2020-05-26 ENCOUNTER — Other Ambulatory Visit: Payer: Self-pay | Admitting: Otolaryngology

## 2020-05-27 ENCOUNTER — Other Ambulatory Visit: Payer: Self-pay | Admitting: Cardiology

## 2020-05-29 DIAGNOSIS — D497 Neoplasm of unspecified behavior of endocrine glands and other parts of nervous system: Secondary | ICD-10-CM | POA: Insufficient documentation

## 2020-05-29 DIAGNOSIS — D352 Benign neoplasm of pituitary gland: Secondary | ICD-10-CM | POA: Diagnosis not present

## 2020-05-29 NOTE — Progress Notes (Signed)
Your procedure is scheduled on Thursday June 10.  Report to [facility] Main Entrance "A" at 10:00 A.M., and check in at the Admitting office.  Call this number if you have problems the morning of surgery: [phone]  Call [phone] if you have any questions prior to your surgery date Monday-Friday 8am-4pm   Remember: Do not eat or drink after midnight the night before your surgery  Take these medicines the morning of surgery with A SIP OF WATER: BREO ELLIPTA ---- Please bring all inhalers with you the day of surgery.  carvedilol (COREG)  estradiol (ESTRACE)  levothyroxine (SYNTHROID)  omeprazole (PRILOSEC) pregabalin (LYRICA)  venlafaxine (EFFEXOR-XR)  If needed: HYDROcodone-acetaminophen (NORCO/VICODIN) rizatriptan (MAXALT-MLT  As of today, STOP taking any Aspirin (unless otherwise instructed by your surgeon), Aleve, Naproxen, Ibuprofen, Motrin, Advil, Goody's, BC's, all herbal medications, fish oil, and all vitamins.   WHAT DO I DO ABOUT MY DIABETES MEDICATION?  . THE DAY BEFORE SURGERY   dapagliflozin propanediol (FARXIGA) - NONE  metFORMIN (GLUCOPHAGE-XR)  - take as usual  repaglinide (PRANDIN) - take as usual  VICTOZA - take as usual   . THE MORNING OF SURGERY  dapagliflozin propanediol (FARXIGA) - NONE   metFORMIN (GLUCOPHAGE-XR) - NONE  repaglinide (PRANDIN) - NONE  VICTOZA - NONE   Do not take oral diabetes medicines (pills) the morning of surgery.  HOW TO MANAGE YOUR DIABETES BEFORE AND AFTER SURGERY  Why is it important to control my blood sugar before and after surgery? . Improving blood sugar levels before and after surgery helps healing and can limit problems. . A way of improving blood sugar control is eating a healthy diet by: o  Eating less sugar and carbohydrates o  Increasing activity/exercise o  Talking with your doctor about reaching your blood sugar goals . High blood sugars (greater than 180 mg/dL) can raise your risk of  infections and slow your recovery, so you will need to focus on controlling your diabetes during the weeks before surgery. . Make sure that the doctor who takes care of your diabetes knows about your planned surgery including the date and location.  How do I manage my blood sugar before surgery? . Check your blood sugar at least 4 times a day, starting 2 days before surgery, to make sure that the level is not too high or low. . Check your blood sugar the morning of your surgery when you wake up and every 2 hours until you get to the Short Stay unit. o If your blood sugar is less than 70 mg/dL, you will need to treat for low blood sugar: - Do not take insulin. - Treat a low blood sugar (less than 70 mg/dL) with  cup of clear juice (cranberry or apple), 4 glucose tablets, OR glucose gel. - Recheck blood sugar in 15 minutes after treatment (to make sure it is greater than 70 mg/dL). If your blood sugar is not greater than 70 mg/dL on recheck, call [phone] for further instructions. . Report your blood sugar to the short stay nurse when you get to Short Stay.  . If you are admitted to the hospital after surgery: o Your blood sugar will be checked by the staff and you will probably be given insulin after surgery (instead of oral diabetes medicines) to make sure you have good blood sugar levels. o The goal for blood sugar control after surgery is 80-180 mg/dL.    The Morning of Surgery  Do not wear jewelry, make-up or  nail polish.  Do not wear lotions, powders, perfumes, or deodorant  Do not shave 48 hours prior to surgery.    Do not bring valuables to the hospital.  [facility] is not responsible for any belongings or valuables.  If you are a smoker, DO NOT Smoke 24 hours prior to surgery  If you wear a CPAP at night please bring your mask the morning of surgery   Remember that you must have someone to transport you home after your surgery, and remain with you for 24 hours if you are  discharged the same day.   Please bring cases for contacts, glasses, hearing aids, dentures or bridgework because it cannot be worn into surgery.    Leave your suitcase in the car.  After surgery it may be brought to your room.  For patients admitted to the hospital, discharge time will be determined by your treatment team.  Patients discharged the day of surgery will not be allowed to drive home.    Special instructions:   [facility]- Preparing For Surgery  Before surgery, you can play an important role. Because skin is not sterile, your skin needs to be as free of germs as possible. You can reduce the number of germs on your skin by washing with CHG (chlorahexidine gluconate) Soap before surgery.  CHG is an antiseptic cleaner which kills germs and bonds with the skin to continue killing germs even after washing.    Oral Hygiene is also important to reduce your risk of infection.  Remember - BRUSH YOUR TEETH THE MORNING OF SURGERY WITH YOUR REGULAR TOOTHPASTE  Please do not use if you have an allergy to CHG or antibacterial soaps. If your skin becomes reddened/irritated stop using the CHG.  Do not shave (including legs and underarms) for at least 48 hours prior to first CHG shower. It is OK to shave your face.  Please follow these instructions carefully.   1. Shower the NIGHT BEFORE SURGERY and the MORNING OF SURGERY with CHG Soap.   2. If you chose to wash your hair and body, wash as usual with your normal shampoo and body-wash/soap.  3. Rinse your hair and body thoroughly to remove the shampoo and soap.  4. Apply CHG directly to the skin (ONLY FROM THE NECK DOWN) and wash gently with a scrungie or a clean washcloth.   5. Do not use on open wounds or open sores. Avoid contact with your eyes, ears, mouth and genitals (private parts). Wash Face and genitals (private parts)  with your normal soap.   6. Wash thoroughly, paying special attention to the area where your surgery will be  performed.  7. Thoroughly rinse your body with warm water from the neck down.  8. DO NOT shower/wash with your normal soap after using and rinsing off the CHG Soap.  9. Pat yourself dry with a CLEAN TOWEL.  10. Wear CLEAN PAJAMAS to bed the night before surgery  11. Place CLEAN SHEETS on your bed the night of your first shower and DO NOT SLEEP WITH PETS.  12. Wear comfortable clothes the morning of surgery.     Day of Surgery:  Please shower the morning of surgery with the CHG soap Do not apply any deodorants/lotions. Please wear clean clothes to the hospital/surgery center.   Remember to brush your teeth WITH YOUR REGULAR TOOTHPASTE.   Please read over the following fact sheets that you were given.

## 2020-05-30 DIAGNOSIS — E039 Hypothyroidism, unspecified: Secondary | ICD-10-CM | POA: Diagnosis not present

## 2020-05-30 DIAGNOSIS — I10 Essential (primary) hypertension: Secondary | ICD-10-CM | POA: Diagnosis not present

## 2020-05-30 DIAGNOSIS — J4540 Moderate persistent asthma, uncomplicated: Secondary | ICD-10-CM | POA: Diagnosis not present

## 2020-05-30 DIAGNOSIS — E1165 Type 2 diabetes mellitus with hyperglycemia: Secondary | ICD-10-CM | POA: Diagnosis not present

## 2020-06-01 ENCOUNTER — Encounter (HOSPITAL_COMMUNITY): Payer: Self-pay

## 2020-06-01 ENCOUNTER — Other Ambulatory Visit: Payer: Self-pay

## 2020-06-01 ENCOUNTER — Encounter (HOSPITAL_COMMUNITY)
Admission: RE | Admit: 2020-06-01 | Discharge: 2020-06-01 | Disposition: A | Discharge status: Home / Self Care | Payer: Medicare Other | Source: Ambulatory Visit | Attending: Neurological Surgery | Admitting: Neurological Surgery

## 2020-06-01 ENCOUNTER — Telehealth: Payer: Self-pay | Admitting: Endocrinology

## 2020-06-01 ENCOUNTER — Other Ambulatory Visit (HOSPITAL_COMMUNITY)
Admission: RE | Admit: 2020-06-01 | Discharge: 2020-06-01 | Disposition: A | Discharge status: Home / Self Care | Payer: Medicare Other | Source: Ambulatory Visit | Attending: Neurological Surgery | Admitting: Neurological Surgery

## 2020-06-01 DIAGNOSIS — Z880 Allergy status to penicillin: Secondary | ICD-10-CM | POA: Diagnosis not present

## 2020-06-01 DIAGNOSIS — Z01818 Encounter for other preprocedural examination: Secondary | ICD-10-CM | POA: Insufficient documentation

## 2020-06-01 DIAGNOSIS — I10 Essential (primary) hypertension: Secondary | ICD-10-CM | POA: Diagnosis not present

## 2020-06-01 DIAGNOSIS — Z888 Allergy status to other drugs, medicaments and biological substances status: Secondary | ICD-10-CM | POA: Diagnosis not present

## 2020-06-01 DIAGNOSIS — Z8249 Family history of ischemic heart disease and other diseases of the circulatory system: Secondary | ICD-10-CM | POA: Diagnosis not present

## 2020-06-01 DIAGNOSIS — Z823 Family history of stroke: Secondary | ICD-10-CM | POA: Diagnosis not present

## 2020-06-01 DIAGNOSIS — D352 Benign neoplasm of pituitary gland: Secondary | ICD-10-CM | POA: Diagnosis not present

## 2020-06-01 DIAGNOSIS — E1140 Type 2 diabetes mellitus with diabetic neuropathy, unspecified: Secondary | ICD-10-CM | POA: Diagnosis not present

## 2020-06-01 DIAGNOSIS — E039 Hypothyroidism, unspecified: Secondary | ICD-10-CM | POA: Diagnosis not present

## 2020-06-01 DIAGNOSIS — K219 Gastro-esophageal reflux disease without esophagitis: Secondary | ICD-10-CM | POA: Diagnosis not present

## 2020-06-01 DIAGNOSIS — Z825 Family history of asthma and other chronic lower respiratory diseases: Secondary | ICD-10-CM | POA: Diagnosis not present

## 2020-06-01 DIAGNOSIS — H5461 Unqualified visual loss, right eye, normal vision left eye: Secondary | ICD-10-CM | POA: Diagnosis not present

## 2020-06-01 DIAGNOSIS — Z20822 Contact with and (suspected) exposure to covid-19: Secondary | ICD-10-CM | POA: Insufficient documentation

## 2020-06-01 DIAGNOSIS — E7800 Pure hypercholesterolemia, unspecified: Secondary | ICD-10-CM | POA: Diagnosis not present

## 2020-06-01 DIAGNOSIS — M810 Age-related osteoporosis without current pathological fracture: Secondary | ICD-10-CM | POA: Diagnosis not present

## 2020-06-01 LAB — BASIC METABOLIC PANEL
Anion gap: 7 (ref 5–15)
BUN: 10 mg/dL (ref 8–23)
CO2: 26 mmol/L (ref 22–32)
Calcium: 9 mg/dL (ref 8.9–10.3)
Chloride: 108 mmol/L (ref 98–111)
Creatinine, Ser: 0.7 mg/dL (ref 0.44–1.00)
GFR calc Af Amer: >60 mL/min (ref >60)
GFR calc non Af Amer: >60 mL/min (ref >60)
Glucose, Bld: 132 mg/dL — ABNORMAL HIGH (ref 70–99)
Potassium: 3.9 mmol/L (ref 3.5–5.1)
Sodium: 141 mmol/L (ref 135–145)

## 2020-06-01 LAB — CBC
HCT: 43.9 % (ref 36.0–46.0)
Hemoglobin: 13.8 g/dL (ref 12.0–15.0)
MCH: 30.1 pg (ref 26.0–34.0)
MCHC: 31.4 g/dL (ref 30.0–36.0)
MCV: 95.6 fL (ref 80.0–100.0)
Platelets: 259 10*3/uL (ref 150–400)
RBC: 4.59 MIL/uL (ref 3.87–5.11)
RDW: 14.2 % (ref 11.5–15.5)
WBC: 10.9 10*3/uL — ABNORMAL HIGH (ref 4.0–10.5)
nRBC: 0 % (ref 0.0–0.2)

## 2020-06-01 LAB — HEMOGLOBIN A1C
Hgb A1c MFr Bld: 7.5 % — ABNORMAL HIGH (ref 4.8–5.6)
Mean Plasma Glucose: 168.55 mg/dL

## 2020-06-01 LAB — GLUCOSE, CAPILLARY: Glucose-Capillary: 188 mg/dL — ABNORMAL HIGH (ref 70–99)

## 2020-06-01 LAB — SARS CORONAVIRUS 2 (TAT 6-24 HRS): SARS Coronavirus 2: NEGATIVE

## 2020-06-01 NOTE — Telephone Encounter (Signed)
Patient called re: Patient is having Pituitary tumor surgery on 06/04/20 at noon. Patient was told to let the Doctor who takes care of patient's diabetes know about the surgery. Surgeon is Dr. Ellene Route.

## 2020-06-01 NOTE — Progress Notes (Signed)
PCP - Allyn Kenner Cardiologist - Dr. Harl Bowie Endocrinologist - Dr. Loanne Drilling  Chest x-ray - n/a EKG - 06-01-20 Stress Test - 06-25-14 ECHO - 06-25-14  DM - yes, Type 2 Fasting Sugars - 95-105   Aspirin Instructions: follow your surgeon's instructions on when to stop ASA  COVID TEST- Monday, 06-01-20   Anesthesia review: n/a  Patient denies shortness of breath, fever, cough and chest pain at PAT appointment   All instructions explained to the patient, with a verbal understanding of the material. Patient agrees to go over the instructions while at home for a better understanding. Patient also instructed to self quarantine after being tested for COVID-19. The opportunity to ask questions was provided.

## 2020-06-02 NOTE — Telephone Encounter (Signed)
OK, see you on 06/15/20

## 2020-06-04 ENCOUNTER — Inpatient Hospital Stay (HOSPITAL_COMMUNITY): Payer: Medicare Other

## 2020-06-04 ENCOUNTER — Inpatient Hospital Stay (HOSPITAL_COMMUNITY)
Admission: RE | Admit: 2020-06-04 | Discharge: 2020-06-06 | DRG: 615 | Disposition: A | Discharge status: Home / Self Care | Payer: Medicare Other | Attending: Neurological Surgery | Admitting: Neurological Surgery

## 2020-06-04 ENCOUNTER — Encounter (HOSPITAL_COMMUNITY): Payer: Self-pay | Admitting: Neurological Surgery

## 2020-06-04 ENCOUNTER — Other Ambulatory Visit: Payer: Self-pay

## 2020-06-04 ENCOUNTER — Encounter (HOSPITAL_COMMUNITY)
Admission: RE | Disposition: A | Discharge status: Home / Self Care | Payer: Self-pay | Source: Home / Self Care | Attending: Neurological Surgery

## 2020-06-04 DIAGNOSIS — D352 Benign neoplasm of pituitary gland: Principal | ICD-10-CM | POA: Diagnosis present

## 2020-06-04 DIAGNOSIS — Z880 Allergy status to penicillin: Secondary | ICD-10-CM

## 2020-06-04 DIAGNOSIS — K219 Gastro-esophageal reflux disease without esophagitis: Secondary | ICD-10-CM | POA: Diagnosis present

## 2020-06-04 DIAGNOSIS — E119 Type 2 diabetes mellitus without complications: Secondary | ICD-10-CM | POA: Diagnosis not present

## 2020-06-04 DIAGNOSIS — D497 Neoplasm of unspecified behavior of endocrine glands and other parts of nervous system: Secondary | ICD-10-CM | POA: Diagnosis not present

## 2020-06-04 DIAGNOSIS — M810 Age-related osteoporosis without current pathological fracture: Secondary | ICD-10-CM | POA: Diagnosis present

## 2020-06-04 DIAGNOSIS — E7800 Pure hypercholesterolemia, unspecified: Secondary | ICD-10-CM | POA: Diagnosis present

## 2020-06-04 DIAGNOSIS — Z823 Family history of stroke: Secondary | ICD-10-CM

## 2020-06-04 DIAGNOSIS — E1140 Type 2 diabetes mellitus with diabetic neuropathy, unspecified: Secondary | ICD-10-CM | POA: Diagnosis present

## 2020-06-04 DIAGNOSIS — Z20822 Contact with and (suspected) exposure to covid-19: Secondary | ICD-10-CM | POA: Diagnosis present

## 2020-06-04 DIAGNOSIS — J323 Chronic sphenoidal sinusitis: Secondary | ICD-10-CM | POA: Diagnosis not present

## 2020-06-04 DIAGNOSIS — E893 Postprocedural hypopituitarism: Secondary | ICD-10-CM

## 2020-06-04 DIAGNOSIS — H5461 Unqualified visual loss, right eye, normal vision left eye: Secondary | ICD-10-CM | POA: Diagnosis not present

## 2020-06-04 DIAGNOSIS — E039 Hypothyroidism, unspecified: Secondary | ICD-10-CM | POA: Diagnosis present

## 2020-06-04 DIAGNOSIS — Z888 Allergy status to other drugs, medicaments and biological substances status: Secondary | ICD-10-CM

## 2020-06-04 DIAGNOSIS — Z8249 Family history of ischemic heart disease and other diseases of the circulatory system: Secondary | ICD-10-CM

## 2020-06-04 DIAGNOSIS — Z825 Family history of asthma and other chronic lower respiratory diseases: Secondary | ICD-10-CM | POA: Diagnosis not present

## 2020-06-04 DIAGNOSIS — I10 Essential (primary) hypertension: Secondary | ICD-10-CM | POA: Diagnosis not present

## 2020-06-04 HISTORY — PX: TRANSNASAL APPROACH: SHX6149

## 2020-06-04 HISTORY — PX: CRANIOTOMY: SHX93

## 2020-06-04 LAB — ABO/RH: ABO/RH(D): A POS

## 2020-06-04 LAB — GLUCOSE, CAPILLARY
Glucose-Capillary: 121 mg/dL — ABNORMAL HIGH (ref 70–99)
Glucose-Capillary: 169 mg/dL — ABNORMAL HIGH (ref 70–99)
Glucose-Capillary: 194 mg/dL — ABNORMAL HIGH (ref 70–99)
Glucose-Capillary: 179 mg/dL — ABNORMAL HIGH (ref 70–99)

## 2020-06-04 LAB — TYPE AND SCREEN
ABO/RH(D): A POS
Antibody Screen: NEGATIVE

## 2020-06-04 SURGERY — CRANIOTOMY HYPOPHYSECTOMY TRANSNASAL APPROACH
Anesthesia: General | Site: Nose

## 2020-06-04 MED ORDER — FENTANYL CITRATE (PF) 100 MCG/2ML IJ SOLN
INTRAMUSCULAR | Status: AC
  Administered 2020-06-04: 25 ug
  Filled 2020-06-04: qty 2

## 2020-06-04 MED ORDER — DAPAGLIFLOZIN PROPANEDIOL 5 MG PO TABS
5.0000 mg | ORAL_TABLET | Freq: Every day | ORAL | Status: DC
  Administered 2020-06-05 – 2020-06-06 (×2): 5 mg via ORAL
  Filled 2020-06-04 (×3): qty 1

## 2020-06-04 MED ORDER — PANTOPRAZOLE SODIUM 40 MG PO TBEC
40.0000 mg | DELAYED_RELEASE_TABLET | Freq: Every day | ORAL | Status: DC
  Administered 2020-06-05 – 2020-06-06 (×2): 40 mg via ORAL
  Filled 2020-06-04 (×2): qty 1

## 2020-06-04 MED ORDER — REPAGLINIDE 1 MG PO TABS
1.0000 mg | ORAL_TABLET | Freq: Every day | ORAL | Status: DC
  Administered 2020-06-05 – 2020-06-06 (×2): 1 mg via ORAL
  Filled 2020-06-04 (×3): qty 1

## 2020-06-04 MED ORDER — PANTOPRAZOLE SODIUM 40 MG IV SOLR: 40.0000 mg | Freq: Every day | INTRAVENOUS | Status: DC

## 2020-06-04 MED ORDER — LIDOCAINE-EPINEPHRINE 1 %-1:100000 IJ SOLN
INTRAMUSCULAR | Status: DC | PRN
  Administered 2020-06-04: 7 mL

## 2020-06-04 MED ORDER — HYDROCODONE-ACETAMINOPHEN 5-325 MG PO TABS
1.0000 | ORAL_TABLET | ORAL | Status: DC | PRN
  Administered 2020-06-05 – 2020-06-06 (×3): 1 via ORAL
  Filled 2020-06-04 (×3): qty 1

## 2020-06-04 MED ORDER — SUGAMMADEX SODIUM 200 MG/2ML IV SOLN
INTRAVENOUS | Status: DC | PRN
Start: 2020-06-04 — End: 2020-06-04
  Administered 2020-06-04 (×2): 50 mg via INTRAVENOUS
  Administered 2020-06-04: 100 mg via INTRAVENOUS

## 2020-06-04 MED ORDER — OXYMETAZOLINE HCL 0.05 % NA SOLN
NASAL | Status: AC
  Filled 2020-06-04: qty 30

## 2020-06-04 MED ORDER — CYCLOSPORINE 0.05 % OP EMUL
1.0000 [drp] | Freq: Two times a day (BID) | OPHTHALMIC | Status: DC
  Administered 2020-06-04 – 2020-06-06 (×4): 1 [drp] via OPHTHALMIC
  Filled 2020-06-04 (×7): qty 1

## 2020-06-04 MED ORDER — MIDAZOLAM HCL 2 MG/2ML IJ SOLN
INTRAMUSCULAR | Status: AC
  Filled 2020-06-04: qty 2

## 2020-06-04 MED ORDER — CEFAZOLIN SODIUM-DEXTROSE 1-4 GM/50ML-% IV SOLN
1.0000 g | Freq: Three times a day (TID) | INTRAVENOUS | Status: AC
  Administered 2020-06-04 – 2020-06-05 (×2): 1 g via INTRAVENOUS
  Filled 2020-06-04 (×2): qty 50

## 2020-06-04 MED ORDER — FENTANYL CITRATE (PF) 250 MCG/5ML IJ SOLN
INTRAMUSCULAR | Status: DC | PRN
  Administered 2020-06-04 (×2): 50 ug via INTRAVENOUS
  Administered 2020-06-04: 100 ug via INTRAVENOUS
  Administered 2020-06-04: 50 ug via INTRAVENOUS

## 2020-06-04 MED ORDER — OXYMETAZOLINE HCL 0.05 % NA SOLN
NASAL | Status: DC | PRN
  Administered 2020-06-04: 2 via TOPICAL

## 2020-06-04 MED ORDER — REPAGLINIDE 1 MG PO TABS: 1.0000 mg | ORAL_TABLET | ORAL | Status: DC

## 2020-06-04 MED ORDER — FLEET ENEMA 7-19 GM/118ML RE ENEM: 1.0000 | ENEMA | Freq: Once | RECTAL | Status: DC | PRN

## 2020-06-04 MED ORDER — SODIUM CHLORIDE 0.9 % IR SOLN
Status: DC | PRN
  Administered 2020-06-04: 1000 mL

## 2020-06-04 MED ORDER — BACLOFEN 10 MG PO TABS
10.0000 mg | ORAL_TABLET | Freq: Every day | ORAL | Status: DC
  Administered 2020-06-05 – 2020-06-06 (×2): 10 mg via ORAL
  Filled 2020-06-04 (×3): qty 1

## 2020-06-04 MED ORDER — THROMBIN 5000 UNITS EX SOLR
CUTANEOUS | Status: DC | PRN
  Administered 2020-06-04: 5000 [IU] via TOPICAL

## 2020-06-04 MED ORDER — HYDROMORPHONE HCL 1 MG/ML IJ SOLN
0.5000 mg | INTRAMUSCULAR | Status: DC | PRN
  Administered 2020-06-04: 1 mg via INTRAVENOUS
  Filled 2020-06-04: qty 1

## 2020-06-04 MED ORDER — PROPOFOL 10 MG/ML IV BOLUS
INTRAVENOUS | Status: DC | PRN
  Administered 2020-06-04: 150 mg via INTRAVENOUS
  Administered 2020-06-04: 50 mg via INTRAVENOUS

## 2020-06-04 MED ORDER — LACTATED RINGERS IV SOLN: INTRAVENOUS | Status: DC

## 2020-06-04 MED ORDER — DOCUSATE SODIUM 100 MG PO CAPS
100.0000 mg | ORAL_CAPSULE | Freq: Two times a day (BID) | ORAL | Status: DC
  Administered 2020-06-05 – 2020-06-06 (×3): 100 mg via ORAL
  Filled 2020-06-04 (×3): qty 1

## 2020-06-04 MED ORDER — LIDOCAINE-EPINEPHRINE 1 %-1:100000 IJ SOLN
INTRAMUSCULAR | Status: AC
  Filled 2020-06-04: qty 1

## 2020-06-04 MED ORDER — SENNA 8.6 MG PO TABS
1.0000 | ORAL_TABLET | Freq: Two times a day (BID) | ORAL | Status: DC
  Administered 2020-06-05 – 2020-06-06 (×2): 8.6 mg via ORAL
  Filled 2020-06-04 (×4): qty 1

## 2020-06-04 MED ORDER — CARVEDILOL 3.125 MG PO TABS
3.1250 mg | ORAL_TABLET | Freq: Two times a day (BID) | ORAL | Status: DC
  Administered 2020-06-05: 3.125 mg via ORAL
  Filled 2020-06-04: qty 1

## 2020-06-04 MED ORDER — ALBUTEROL SULFATE (2.5 MG/3ML) 0.083% IN NEBU: 3.0000 mL | INHALATION_SOLUTION | RESPIRATORY_TRACT | Status: DC | PRN

## 2020-06-04 MED ORDER — REPAGLINIDE 1 MG PO TABS
4.0000 mg | ORAL_TABLET | Freq: Two times a day (BID) | ORAL | Status: DC
  Administered 2020-06-05 – 2020-06-06 (×3): 4 mg via ORAL
  Filled 2020-06-04: qty 2
  Filled 2020-06-04 (×4): qty 4

## 2020-06-04 MED ORDER — LISINOPRIL 10 MG PO TABS
10.0000 mg | ORAL_TABLET | Freq: Every day | ORAL | Status: DC
  Filled 2020-06-04: qty 1

## 2020-06-04 MED ORDER — LIDOCAINE 2% (20 MG/ML) 5 ML SYRINGE
INTRAMUSCULAR | Status: DC | PRN
  Administered 2020-06-04: 100 mg via INTRAVENOUS

## 2020-06-04 MED ORDER — PREGABALIN 75 MG PO CAPS
150.0000 mg | ORAL_CAPSULE | Freq: Two times a day (BID) | ORAL | Status: DC
  Administered 2020-06-05 – 2020-06-06 (×3): 150 mg via ORAL
  Filled 2020-06-04 (×3): qty 2

## 2020-06-04 MED ORDER — ATORVASTATIN CALCIUM 40 MG PO TABS
40.0000 mg | ORAL_TABLET | Freq: Every day | ORAL | Status: DC
  Administered 2020-06-05: 40 mg via ORAL
  Filled 2020-06-04: qty 1

## 2020-06-04 MED ORDER — PROMETHAZINE HCL 25 MG PO TABS: 12.5000 mg | ORAL_TABLET | ORAL | Status: DC | PRN

## 2020-06-04 MED ORDER — CHLORHEXIDINE GLUCONATE 0.12 % MT SOLN
15.0000 mL | Freq: Once | OROMUCOSAL | Status: AC
  Administered 2020-06-04: 15 mL via OROMUCOSAL
  Filled 2020-06-04: qty 15

## 2020-06-04 MED ORDER — ORAL CARE MOUTH RINSE: 15.0000 mL | Freq: Once | OROMUCOSAL | Status: AC

## 2020-06-04 MED ORDER — RIZATRIPTAN BENZOATE 10 MG PO TBDP: 10.0000 mg | ORAL_TABLET | ORAL | Status: DC | PRN

## 2020-06-04 MED ORDER — ONDANSETRON HCL 4 MG PO TABS
4.0000 mg | ORAL_TABLET | ORAL | Status: DC | PRN
  Filled 2020-06-04: qty 1

## 2020-06-04 MED ORDER — ESTRADIOL 1 MG PO TABS
1.0000 mg | ORAL_TABLET | Freq: Every day | ORAL | Status: DC
  Administered 2020-06-05 – 2020-06-06 (×2): 1 mg via ORAL
  Filled 2020-06-04 (×2): qty 1

## 2020-06-04 MED ORDER — HYDROCODONE-ACETAMINOPHEN 5-325 MG PO TABS: 1.0000 | ORAL_TABLET | Freq: Four times a day (QID) | ORAL | Status: DC | PRN

## 2020-06-04 MED ORDER — CHLORHEXIDINE GLUCONATE CLOTH 2 % EX PADS: 6.0000 | MEDICATED_PAD | Freq: Every day | CUTANEOUS | Status: DC

## 2020-06-04 MED ORDER — ONDANSETRON HCL 4 MG/2ML IJ SOLN
INTRAMUSCULAR | Status: AC
  Filled 2020-06-04: qty 2

## 2020-06-04 MED ORDER — METFORMIN HCL ER 500 MG PO TB24
1000.0000 mg | ORAL_TABLET | Freq: Every day | ORAL | Status: DC
  Administered 2020-06-05 – 2020-06-06 (×2): 1000 mg via ORAL
  Filled 2020-06-04 (×3): qty 2

## 2020-06-04 MED ORDER — ONDANSETRON HCL 4 MG/2ML IJ SOLN
INTRAMUSCULAR | Status: DC | PRN
  Administered 2020-06-04: 4 mg via INTRAVENOUS

## 2020-06-04 MED ORDER — ROCURONIUM BROMIDE 10 MG/ML (PF) SYRINGE
PREFILLED_SYRINGE | INTRAVENOUS | Status: DC | PRN
  Administered 2020-06-04: 20 mg via INTRAVENOUS
  Administered 2020-06-04: 50 mg via INTRAVENOUS

## 2020-06-04 MED ORDER — VANCOMYCIN HCL IN DEXTROSE 1-5 GM/200ML-% IV SOLN
INTRAVENOUS | Status: AC
  Administered 2020-06-04: 1000 mg via INTRAVENOUS
  Filled 2020-06-04: qty 200

## 2020-06-04 MED ORDER — PHENYLEPHRINE HCL-NACL 10-0.9 MG/250ML-% IV SOLN
INTRAVENOUS | Status: DC | PRN
  Administered 2020-06-04: 10 ug/min via INTRAVENOUS
  Administered 2020-06-04: 15 ug/min via INTRAVENOUS

## 2020-06-04 MED ORDER — ADULT MULTIVITAMIN W/MINERALS CH
1.0000 | ORAL_TABLET | ORAL | Status: DC
  Administered 2020-06-05 – 2020-06-06 (×2): 1 via ORAL
  Filled 2020-06-04 (×2): qty 1

## 2020-06-04 MED ORDER — THROMBIN 5000 UNITS EX SOLR
CUTANEOUS | Status: AC
  Filled 2020-06-04: qty 5000

## 2020-06-04 MED ORDER — HEMOSTATIC AGENTS (NO CHARGE) OPTIME
TOPICAL | Status: DC | PRN
  Administered 2020-06-04 (×3): 1 via TOPICAL

## 2020-06-04 MED ORDER — CHLORHEXIDINE GLUCONATE CLOTH 2 % EX PADS: 6.0000 | MEDICATED_PAD | Freq: Once | CUTANEOUS | Status: DC

## 2020-06-04 MED ORDER — FENTANYL CITRATE (PF) 100 MCG/2ML IJ SOLN: 25.0000 ug | INTRAMUSCULAR | Status: DC | PRN

## 2020-06-04 MED ORDER — LIRAGLUTIDE 18 MG/3ML ~~LOC~~ SOPN: 1.8000 mg | PEN_INJECTOR | Freq: Every day | SUBCUTANEOUS | Status: DC

## 2020-06-04 MED ORDER — VANCOMYCIN HCL IN DEXTROSE 1-5 GM/200ML-% IV SOLN: 1000.0000 mg | INTRAVENOUS | Status: AC

## 2020-06-04 MED ORDER — CEPHALEXIN 500 MG PO CAPS
500.0000 mg | ORAL_CAPSULE | Freq: Three times a day (TID) | ORAL | Status: DC
  Administered 2020-06-05 – 2020-06-06 (×4): 500 mg via ORAL
  Filled 2020-06-04 (×5): qty 1

## 2020-06-04 MED ORDER — ONDANSETRON HCL 4 MG/2ML IJ SOLN
4.0000 mg | INTRAMUSCULAR | Status: DC | PRN
  Administered 2020-06-04: 4 mg via INTRAVENOUS
  Filled 2020-06-04: qty 2

## 2020-06-04 MED ORDER — KCL IN DEXTROSE-NACL 20-5-0.45 MEQ/L-%-% IV SOLN
INTRAVENOUS | Status: DC
  Filled 2020-06-04: qty 1000

## 2020-06-04 MED ORDER — CEPHALEXIN 500 MG PO CAPS: 500.0000 mg | ORAL_CAPSULE | Freq: Three times a day (TID) | ORAL | 0 refills | Status: AC

## 2020-06-04 MED ORDER — FLUTICASONE FUROATE-VILANTEROL 200-25 MCG/INH IN AEPB
1.0000 | INHALATION_SPRAY | Freq: Every day | RESPIRATORY_TRACT | Status: DC
  Administered 2020-06-05 – 2020-06-06 (×2): 1 via RESPIRATORY_TRACT
  Filled 2020-06-04: qty 28

## 2020-06-04 MED ORDER — PHENYLEPHRINE 40 MCG/ML (10ML) SYRINGE FOR IV PUSH (FOR BLOOD PRESSURE SUPPORT)
PREFILLED_SYRINGE | INTRAVENOUS | Status: DC | PRN
  Administered 2020-06-04 (×3): 80 ug via INTRAVENOUS

## 2020-06-04 MED ORDER — LACTATED RINGERS IV SOLN: INTRAVENOUS | Status: DC | PRN

## 2020-06-04 MED ORDER — BISACODYL 10 MG RE SUPP: 10.0000 mg | Freq: Every day | RECTAL | Status: DC | PRN

## 2020-06-04 MED ORDER — POLYETHYLENE GLYCOL 3350 17 G PO PACK: 17.0000 g | PACK | Freq: Every day | ORAL | Status: DC | PRN

## 2020-06-04 MED ORDER — SALINE SPRAY 0.65 % NA SOLN
4.0000 | NASAL | Status: DC | PRN
  Administered 2020-06-06: 4 via NASAL
  Filled 2020-06-04: qty 44

## 2020-06-04 MED ORDER — FENTANYL CITRATE (PF) 100 MCG/2ML IJ SOLN
25.0000 ug | INTRAMUSCULAR | Status: DC | PRN
  Administered 2020-06-04 (×2): 25 ug via INTRAVENOUS

## 2020-06-04 MED ORDER — DEXMEDETOMIDINE HCL 200 MCG/2ML IV SOLN
INTRAVENOUS | Status: DC | PRN
  Administered 2020-06-04: 8 ug via INTRAVENOUS

## 2020-06-04 MED ORDER — LEVOTHYROXINE SODIUM 50 MCG PO TABS
50.0000 ug | ORAL_TABLET | Freq: Every day | ORAL | Status: DC
  Administered 2020-06-05 – 2020-06-06 (×2): 50 ug via ORAL
  Filled 2020-06-04 (×2): qty 1

## 2020-06-04 MED ORDER — FENTANYL CITRATE (PF) 250 MCG/5ML IJ SOLN
INTRAMUSCULAR | Status: AC
  Filled 2020-06-04: qty 5

## 2020-06-04 MED ORDER — VENLAFAXINE HCL ER 150 MG PO CP24
150.0000 mg | ORAL_CAPSULE | ORAL | Status: DC
  Administered 2020-06-05 – 2020-06-06 (×2): 150 mg via ORAL
  Filled 2020-06-04 (×2): qty 1

## 2020-06-04 MED ORDER — PROMETHAZINE HCL 25 MG/ML IJ SOLN
12.5000 mg | INTRAMUSCULAR | Status: DC | PRN
  Administered 2020-06-04: 12.5 mg via INTRAVENOUS
  Filled 2020-06-04: qty 1

## 2020-06-04 MED ORDER — LABETALOL HCL 5 MG/ML IV SOLN: 10.0000 mg | INTRAVENOUS | Status: DC | PRN

## 2020-06-04 MED ORDER — 0.9 % SODIUM CHLORIDE (POUR BTL) OPTIME
TOPICAL | Status: DC | PRN
  Administered 2020-06-04: 1000 mL

## 2020-06-04 MED ORDER — DEXAMETHASONE SODIUM PHOSPHATE 10 MG/ML IJ SOLN
INTRAMUSCULAR | Status: DC | PRN
  Administered 2020-06-04: 10 mg via INTRAVENOUS

## 2020-06-04 SURGICAL SUPPLY — 105 items
ATTRACTOMAT 16X20 MAGNETIC DRP (DRAPES) IMPLANT
BAG DECANTER FOR FLEXI CONT (MISCELLANEOUS) IMPLANT
BAND RUBBER #18 3X1/16 STRL (MISCELLANEOUS) IMPLANT
BASKET BONE COLLECTION (BASKET) IMPLANT
BENZOIN TINCTURE PRP APPL 2/3 (GAUZE/BANDAGES/DRESSINGS) IMPLANT
BLADE ROTATE RAD 40 4 M4 (BLADE) IMPLANT
BLADE ROTATE RAD 40 4MM M4 (BLADE)
BLADE ROTATE TRICUT 4MX13CM M4 (BLADE) ×1
BLADE ROTATE TRICUT 4X13 M4 (BLADE) ×2 IMPLANT
BLADE SURG 10 STRL SS (BLADE) IMPLANT
BLADE SURG 11 STRL SS (BLADE) ×6 IMPLANT
BLADE SURG 15 STRL LF DISP TIS (BLADE) IMPLANT
BLADE SURG 15 STRL SS (BLADE)
CABLE BIPOLOR RESECTION CORD (MISCELLANEOUS) ×3 IMPLANT
CANISTER SUCT 3000ML PPV (MISCELLANEOUS) ×6 IMPLANT
CATH ROBINSON RED A/P 14FR (CATHETERS) IMPLANT
CLOSURE WOUND 1/2 X4 (GAUZE/BANDAGES/DRESSINGS)
CLOSURE WOUND 1/4X4 (GAUZE/BANDAGES/DRESSINGS)
COAGULATOR SUCT 8FR VV (MISCELLANEOUS) ×3 IMPLANT
COVER MAYO STAND STRL (DRAPES) ×3 IMPLANT
COVER WAND RF STERILE (DRAPES) IMPLANT
DECANTER SPIKE VIAL GLASS SM (MISCELLANEOUS) ×3 IMPLANT
DRAIN SUBARACHNOID (WOUND CARE) IMPLANT
DRAPE C-ARM 42X72 X-RAY (DRAPES) IMPLANT
DRAPE HALF SHEET 40X57 (DRAPES) IMPLANT
DRAPE INCISE IOBAN 66X45 STRL (DRAPES) ×3 IMPLANT
DRAPE MICROSCOPE LEICA (MISCELLANEOUS) IMPLANT
DRESSING NASAL POPE 10X1.5X2.5 (GAUZE/BANDAGES/DRESSINGS) IMPLANT
DRSG NASAL POPE 10X1.5X2.5 (GAUZE/BANDAGES/DRESSINGS)
DRSG NASOPORE 8CM (GAUZE/BANDAGES/DRESSINGS) ×6 IMPLANT
DURAPREP 26ML APPLICATOR (WOUND CARE) IMPLANT
ELECT COATED BLADE 2.86 ST (ELECTRODE) IMPLANT
ELECT NEEDLE TIP 2.8 STRL (NEEDLE) IMPLANT
ELECT REM PT RETURN 9FT ADLT (ELECTROSURGICAL) ×3
ELECTRODE REM PT RTRN 9FT ADLT (ELECTROSURGICAL) ×1 IMPLANT
FILTER ARTHROSCOPY CONVERTOR (FILTER) IMPLANT
GAUZE PACKING FOLDED 2  STR (GAUZE/BANDAGES/DRESSINGS)
GAUZE PACKING FOLDED 2 STR (GAUZE/BANDAGES/DRESSINGS) IMPLANT
GAUZE SPONGE 2X2 8PLY STRL LF (GAUZE/BANDAGES/DRESSINGS) IMPLANT
GAUZE SPONGE 4X4 12PLY STRL (GAUZE/BANDAGES/DRESSINGS) IMPLANT
GLOVE BIOGEL M 7.0 STRL (GLOVE) ×6 IMPLANT
GLOVE BIOGEL PI IND STRL 8.5 (GLOVE) ×1 IMPLANT
GLOVE BIOGEL PI INDICATOR 8.5 (GLOVE) ×2
GLOVE ECLIPSE 8.5 STRL (GLOVE) ×3 IMPLANT
GLOVE EXAM NITRILE XL STR (GLOVE) IMPLANT
GOWN STRL REUS W/ TWL LRG LVL3 (GOWN DISPOSABLE) ×6 IMPLANT
GOWN STRL REUS W/ TWL XL LVL3 (GOWN DISPOSABLE) ×1 IMPLANT
GOWN STRL REUS W/TWL 2XL LVL3 (GOWN DISPOSABLE) ×9 IMPLANT
GOWN STRL REUS W/TWL LRG LVL3 (GOWN DISPOSABLE) ×18
GOWN STRL REUS W/TWL XL LVL3 (GOWN DISPOSABLE) ×3
HEMOSTAT POWDER KIT SURGIFOAM (HEMOSTASIS) ×3 IMPLANT
HEMOSTAT SURGICEL 2X14 (HEMOSTASIS) ×3 IMPLANT
KIT BASIN OR (CUSTOM PROCEDURE TRAY) ×6 IMPLANT
KIT TURNOVER KIT B (KITS) ×3 IMPLANT
MARKER SKIN DUAL TIP RULER LAB (MISCELLANEOUS) IMPLANT
NEEDLE 18GX1X1/2 (RX/OR ONLY) (NEEDLE) IMPLANT
NEEDLE HYPO 25GX1X1/2 BEV (NEEDLE) IMPLANT
NEEDLE HYPO 25X1 1.5 SAFETY (NEEDLE) IMPLANT
NEEDLE SPNL 22GX3.5 QUINCKE BK (NEEDLE) ×3 IMPLANT
NEEDLE SPNL 25GX3.5 QUINCKE BL (NEEDLE) ×3 IMPLANT
NS IRRIG 1000ML POUR BTL (IV SOLUTION) ×3 IMPLANT
PAD ARMBOARD 7.5X6 YLW CONV (MISCELLANEOUS) ×6 IMPLANT
PATTIES SURGICAL .25X.25 (GAUZE/BANDAGES/DRESSINGS) IMPLANT
PATTIES SURGICAL .5 X.5 (GAUZE/BANDAGES/DRESSINGS) ×3 IMPLANT
PATTIES SURGICAL .5 X3 (DISPOSABLE) ×3 IMPLANT
PENCIL BUTTON HOLSTER BLD 10FT (ELECTRODE) ×3 IMPLANT
SHEATH ENDOSCRUB 0 DEG (SHEATH) ×3 IMPLANT
SHEATH ENDOSCRUB 30 DEG (SHEATH) IMPLANT
SHEATH ENDOSCRUB 45 DEG (SHEATH) IMPLANT
SOLUTION BUTLER CLEAR DIP (MISCELLANEOUS) IMPLANT
SPECIMEN JAR SMALL (MISCELLANEOUS) IMPLANT
SPLINT NASAL DOYLE BI-VL (GAUZE/BANDAGES/DRESSINGS) IMPLANT
SPONGE GAUZE 2X2 STER 10/PKG (GAUZE/BANDAGES/DRESSINGS)
SPONGE LAP 4X18 RFD (DISPOSABLE) IMPLANT
SPONGE NEURO XRAY DETECT 1X3 (DISPOSABLE) ×9 IMPLANT
SPONGE SURGIFOAM ABS GEL 12-7 (HEMOSTASIS) IMPLANT
STAPLER SKIN PROX WIDE 3.9 (STAPLE) IMPLANT
STRIP CLOSURE SKIN 1/2X4 (GAUZE/BANDAGES/DRESSINGS) IMPLANT
STRIP CLOSURE SKIN 1/4X4 (GAUZE/BANDAGES/DRESSINGS) IMPLANT
SUT 5.0 PDS RB-1 (SUTURE)
SUT BONE WAX W31G (SUTURE) IMPLANT
SUT ETHILON 3 0 FSL (SUTURE) IMPLANT
SUT ETHILON 3 0 PS 1 (SUTURE) ×3 IMPLANT
SUT ETHILON 6 0 P 1 (SUTURE) ×3 IMPLANT
SUT PDS AB 4-0 RB1 27 (SUTURE) ×3 IMPLANT
SUT PDS PLUS AB 5-0 RB-1 (SUTURE) IMPLANT
SUT PLAIN 4 0 ~~LOC~~ 1 (SUTURE) IMPLANT
SUT VIC AB 3-0 SH 8-18 (SUTURE) IMPLANT
SUT VIC AB 4-0 P-3 18X BRD (SUTURE) IMPLANT
SUT VIC AB 4-0 P3 18 (SUTURE)
SYR 5ML LL (SYRINGE) IMPLANT
SYR CONTROL 10ML LL (SYRINGE) IMPLANT
TOWEL GREEN STERILE (TOWEL DISPOSABLE) ×3 IMPLANT
TOWEL GREEN STERILE FF (TOWEL DISPOSABLE) ×6 IMPLANT
TRACKER ENT INSTRUMENT (MISCELLANEOUS) ×3 IMPLANT
TRACKER ENT PATIENT (MISCELLANEOUS) ×3 IMPLANT
TRAP SPECIMEN MUCUS 40CC (MISCELLANEOUS) IMPLANT
TRAY ENT MC OR (CUSTOM PROCEDURE TRAY) ×6 IMPLANT
TRAY FOLEY MTR SLVR 16FR STAT (SET/KITS/TRAYS/PACK) ×3 IMPLANT
TUBE CONNECTING 12'X1/4 (SUCTIONS) ×1
TUBE CONNECTING 12X1/4 (SUCTIONS) ×2 IMPLANT
TUBING EXTENTION W/L.L. (IV SETS) ×3 IMPLANT
TUBING STRAIGHTSHOT EPS 5PK (TUBING) IMPLANT
UNDERPAD 30X36 HEAVY ABSORB (UNDERPADS AND DIAPERS) ×3 IMPLANT
WATER STERILE IRR 1000ML POUR (IV SOLUTION) ×6 IMPLANT

## 2020-06-04 NOTE — Anesthesia Preprocedure Evaluation (Signed)
Anesthesia Evaluation  Patient identified by MRN, date of birth, ID band Patient awake    Reviewed: Allergy & Precautions, NPO status , Patient's Chart, lab work & pertinent test results  History of Anesthesia Complications (+) PONV  Airway Mallampati: II  TM Distance: >3 FB     Dental   Pulmonary    breath sounds clear to auscultation       Cardiovascular hypertension,  Rhythm:Regular Rate:Normal     Neuro/Psych    GI/Hepatic negative GI ROS, Neg liver ROS,   Endo/Other  diabetes  Renal/GU negative Renal ROS     Musculoskeletal   Abdominal   Peds  Hematology   Anesthesia Other Findings   Reproductive/Obstetrics                             Anesthesia Physical Anesthesia Plan  ASA: III  Anesthesia Plan: General   Post-op Pain Management:    Induction: Intravenous  PONV Risk Score and Plan: 3 and Ondansetron, Dexamethasone and Midazolam  Airway Management Planned: Oral ETT  Additional Equipment:   Intra-op Plan:   Post-operative Plan:   Informed Consent: I have reviewed the patients History and Physical, chart, labs and discussed the procedure including the risks, benefits and alternatives for the proposed anesthesia with the patient or authorized representative who has indicated his/her understanding and acceptance.     Dental advisory given  Plan Discussed with: CRNA, Anesthesiologist and Surgeon  Anesthesia Plan Comments:         Anesthesia Quick Evaluation

## 2020-06-04 NOTE — Anesthesia Procedure Notes (Signed)
Procedure Name: Intubation Date/Time: 06/04/2020 11:29 AM Performed by: Trinna Post., CRNA Pre-anesthesia Checklist: Patient identified, Emergency Drugs available, Suction available and Patient being monitored Patient Re-evaluated:Patient Re-evaluated prior to induction Oxygen Delivery Method: Circle system utilized Preoxygenation: Pre-oxygenation with 100% oxygen Induction Type: IV induction Ventilation: Mask ventilation without difficulty Laryngoscope Size: 3 and Mac Grade View: Grade I Tube type: Oral Tube size: 7.0 mm Number of attempts: 1 Airway Equipment and Method: Stylet Placement Confirmation: ETT inserted through vocal cords under direct vision,  positive ETCO2 and breath sounds checked- equal and bilateral Secured at: 22 cm Tube secured with: Tape Dental Injury: Teeth and Oropharynx as per pre-operative assessment

## 2020-06-04 NOTE — Anesthesia Procedure Notes (Signed)
Arterial Line Insertion Start/End6/09/2020 11:35 AM Performed by: Trinna Post., CRNA, CRNA  Patient location: OR. Preanesthetic checklist: patient identified, IV checked, site marked, risks and benefits discussed, surgical consent, monitors and equipment checked, pre-op evaluation, timeout performed and anesthesia consent Patient sedated Left, radial was placed Catheter size: 20 G Hand hygiene performed  and maximum sterile barriers used   Attempts: 1 Procedure performed without using ultrasound guided technique. Following insertion, dressing applied and Biopatch. Post procedure assessment: normal  Patient tolerated the procedure well with no immediate complications.

## 2020-06-04 NOTE — Anesthesia Postprocedure Evaluation (Signed)
Anesthesia Post Note  Patient: [name] White  Procedure(s) Performed: Endoscopic Transphenoidal Resection of Recurrent PituitaryTumor (N/A Nose) TRANSNASAL APPROACH (N/A Nose)     Patient location during evaluation: PACU Anesthesia Type: General Level of consciousness: awake Pain management: pain level controlled Vital Signs Assessment: post-procedure vital signs reviewed and stable Respiratory status: spontaneous breathing Cardiovascular status: stable Postop Assessment: no apparent nausea or vomiting Anesthetic complications: no   No complications documented.  Last Vitals:  Vitals:   06/04/20 1559 06/04/20 1622  BP: 121/70   Pulse: 89   Resp: 17   Temp: 36.6 C 36.5 C  SpO2: 94%     Last Pain:  Vitals:   06/04/20 1622  TempSrc: Oral  PainSc:                  Daimion Adamcik

## 2020-06-04 NOTE — Transfer of Care (Signed)
Immediate Anesthesia Transfer of Care Note  Patient: [name] White  Procedure(s) Performed: Endoscopic Transphenoidal Resection of Recurrent PituitaryTumor (N/A Nose) TRANSNASAL APPROACH (N/A Nose)  Patient Location: PACU  Anesthesia Type:General  Level of Consciousness: awake and drowsy  Airway & Oxygen Therapy: Patient Spontanous Breathing and Patient connected to face mask oxygen  Post-op Assessment: Report given to RN, Post -op Vital signs reviewed and stable and Patient moving all extremities  Post vital signs: Reviewed and stable  Last Vitals:  Vitals Value Taken Time  BP    Temp    Pulse 93 06/04/20 1417  Resp 17 06/04/20 1417  SpO2 95 % 06/04/20 1417  Vitals shown include unvalidated device data.  Last Pain:  Vitals:   06/04/20 1013  TempSrc:   PainSc: 0-No pain      Patients Stated Pain Goal: 3 (32/12/24 8250)  Complications: No complications documented.

## 2020-06-04 NOTE — Progress Notes (Signed)
   ENT Progress Note: Procedure(s): Endoscopic Transphenoidal Resection of Recurrent PituitaryTumor TRANSNASAL APPROACH   Subjective: Stable, no headache  Objective: Vital signs in last 24 hours: Temp:  [97.1 F (36.2 C)] 97.1 F (36.2 C) (06/10 1000) Pulse Rate:  [92] 92 (06/10 1000) Resp:  [18] 18 (06/10 1000) BP: (122)/(78) 122/78 (06/10 1000) SpO2:  [98 %] 98 % (06/10 1000) Weight:  [72.6 kg] 72.6 kg (06/10 1000) Weight change:     Intake/Output from previous day: No intake/output data recorded. Intake/Output this shift: No intake/output data recorded.  Labs: No results for input(s): WBC, HGB, HCT, PLT in the last 72 hours. No results for input(s): NA, K, CL, CO2, GLUCOSE, BUN, CALCIUM in the last 72 hours.  Invalid input(s): CREATININR  Studies/Results: No results found.   PHYSICAL EXAM: No change    Assessment/Plan: Adm for Endoscopic Tran-sphenoidal Revision Pituitary resection.    Jerrell Belfast 06/04/2020, 11:00 AM

## 2020-06-04 NOTE — Op Note (Signed)
Date of surgery: 06/04/2020 Preoperative diagnosis: Recurrent pituitary adenoma with suprasellar extension and visual deficit Postoperative diagnosis: Same Procedure: Transnasal endoscopic resection of pituitary tumor with image guidance Surgeon: Deatra Ina, MD First Assistant: Kristeen Miss, MD Approach and closure: Jerrell Belfast, MD Anesthesia: General endotracheal Indications: [name] White is a 79 year old individual who has had a pituitary tumor resected about 20 years ago.  At that time she had substantial visual loss and was able to regain her visual function.  She had no evidence of recurrence when being followed for several years and ultimately was discharged from care.  An incidental CT scan was performed secondary to the patient having unrelated symptoms and was noted that she had a pituitary mass.  An MRI confirmed the presence of this being a recurrent pituitary adenoma that now is invaded the this suprasellar cistern and also appears to be eroding the clivus.  Visual analysis yielded that she had significant visual change and deterioration of visual status.  She had not been aware of this.  She was advised regarding the need for surgical resection.  She was advised that this would occur via an endoscopic transnasal route.  Procedure: Dr. Wilburn Cornelia started the procedure by endoscopically accessing the anterior sphenoid sinus.  Once the sphenoid sinus was entered Dr. Venetia Constable used the endoscopic cannula and drilled to remove some bony tissue in the anterior pituitary wall which had reformed a bony surface.  The septum was already removed from previous surgery.  Then the capsular mass was encountered and this was noted to be quite fibrous.  The mass was entered and resection was performed using a debrider and micro dissection technique.  Portions of the tumor resected and small fragments and ultimately hemostasis was achieved as the tumor sections were removed.  A good portion of the  tumor was removed from the region of the clivus and also the intrasellar region.  However as we dissected more posteriorly it was evident that this tumor was entirely fibers and attached to surrounding structures as we near the area of the intracranial contents a thin wall of tissue was encountered and the tissue itself was translucent and completely consistent with the arachnoid.  As the tumor was densely adherent in these regions it was not felt that a total resection would be feasible with the portions of tumor that we had removed it became apparent that a subtotal decompression of this lesion would be the best that we could achieve.  The specimens of the tumor were sent for pathologic evaluation.  Once the dissection was completed to the extent possible we checked for hemostasis with some Surgifoam that was placed and then irrigated away the thin layer around the arachnoid was then sealed with adherence tissue adhesive.  Dr. Wilburn Cornelia then completed the closure of the sphenoid sinus with several absorbable packings.  Patient was then returned to recovery room in stable condition.  Blood loss for the procedure was estimated at less than 100 cc

## 2020-06-04 NOTE — H&P (Addendum)
[name] White is an 79 y.o. female.   Chief Complaint: Recurrent pituitary tumor HPI: [name] White is a 79 year old individual who had pituitary surgery approximately 20 years ago.  She presented with significant visual loss and after surgery her vision improved substantially.  Her resection cavity was followed for several years and there is no evidence of recurrence.  Recently she had a near syncopal episode and a CT scan.  This demonstrated the presence of a large pituitary mass and on work-up it is evident that she has recurred her pituitary tumor and now invades the clivus significantly there is also a portion that extends into the suprasellar cistern and there is elevation of the optic structures.  Recent evaluation of her vision reveals that he has marked decreased in both eyes but is nearly blind in her right eye.  After careful consideration and discussion of her situation we advised surgical resection of the tumor to the extent possible for doing an estimated endoscopic transnasal approach.  Past Medical History:  Diagnosis Date  . Anxiety   . Asthma   . ASYMPTOMATIC POSTMENOPAUSAL STATUS 02/09/2009  . Cataract   . Constipation 03/19/2013  . Depression   . DIABETES MELLITUS, TYPE II 09/14/2007  . HYPERCHOLESTEROLEMIA 02/09/2009  . HYPERTENSION 02/09/2009  . HYPOTHYROIDISM 09/14/2007  . MIGRAINE HEADACHE 09/14/2007  . Neuropathy   . OSTEOPOROSIS 02/09/2009  . PANCREATITIS 09/14/2007  . 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 PONV (postoperative nausea and vomiting)   . Rectocele 03/19/2013  . Shortness of breath   . SUPERFICIAL PHLEBITIS 09/14/2007  . Varicose veins     Past Surgical History:  Procedure Laterality Date  . ABDOMINAL HYSTERECTOMY    . ANTERIOR AND POSTERIOR REPAIR N/A 04/02/2013   Procedure: ANTERIOR (CYSTOCELE) AND POSTERIOR REPAIR (RECTOCELE);  Surgeon: Jonnie Kind, MD;  Location: [facility];  Service: Gynecology;  Laterality: N/A;  . APPENDECTOMY    . BRAIN SURGERY    .  CATARACT EXTRACTION W/PHACO  12/05/2011   Procedure: CATARACT EXTRACTION PHACO AND INTRAOCULAR LENS PLACEMENT (IOC);  Surgeon: Tonny Branch;  Location: [facility];  Service: Ophthalmology;  Laterality: Left;  CDE:9.65  . CHOLECYSTECTOMY    . CYSTOSCOPY    . ENDOVENOUS ABLATION SAPHENOUS VEIN W/ LASER  11-03-2011   right greater saphenous vein   left leg done 10-2011  . EYE SURGERY  98   right cataract extraction 98  . LAPAROSCOPIC NISSEN FUNDOPLICATION    . NM ESOPHAGEAL REFLUX  08-11-11  . PITUITARY EXCISION  10/1997  . POSTERIOR REPAIR    . TRANSPHENOIDAL / TRANSNASAL HYPOPHYSECTOMY / RESECTION PITUITARY TUMOR  08-11-11    Family History  Problem Relation Age of Onset  . Heart disease Mother   . Asthma Mother   . COPD Father   . Heart disease Father   . Stroke Father   . Asthma Daughter   . Migraines Daughter   . Neuropathy Son   . Cancer Neg Hx   . Anesthesia problems Neg Hx   . Hypotension Neg Hx   . Malignant hyperthermia Neg Hx   . Pseudochol deficiency Neg Hx    Social History:  reports that she has never smoked. She has never used smokeless tobacco. She reports that she does not drink alcohol and does not use drugs.  Allergies:  Allergies  Allergen Reactions  . Betadine [Povidone Iodine] Hives  . Penicillins Rash    No medications prior to admission.    No results found for this or any  previous visit (from the past 48 hour(s)). No results found.  Review of Systems  Constitutional: Negative.   HENT: Negative.   Eyes:       Recent ophthalmologic evaluation shows markedly constricted pupil of the left eye and weight fingers visual acuity on the right  Respiratory: Negative.   Cardiovascular: Negative.   Gastrointestinal: Negative.   Endocrine: Negative.   Genitourinary: Negative.   Musculoskeletal: Negative.   Allergic/Immunologic: Negative.   Neurological: Negative.     There were no vitals taken for this visit. Physical Exam  Constitutional: She is  oriented to person, place, and time.  HENT:  Head: Normocephalic and atraumatic.  Nose: Nose normal.  Mouth/Throat: Mucous membranes are moist.  Eyes: Pupils are equal, round, and reactive to light.  Cardiovascular: Normal rate and regular rhythm.  Respiratory: Effort normal and breath sounds normal.  GI: Soft. Normal appearance.  Musculoskeletal:        General: Normal range of motion.     Cervical back: Normal range of motion and neck supple.  Neurological: She is alert and oriented to person, place, and time.  Skin: Skin is warm and dry.  Psychiatric: Her behavior is normal. Mood, judgment and thought content normal.     Assessment/Plan Recurrent pituitary tumor.  Plan: Transnasal endoscopic resection of tumor.  Earleen Newport, MD 06/04/2020, 7:50 AM

## 2020-06-04 NOTE — Op Note (Signed)
Operative Note:  ENDOSCOPIC TRANSSPHENOIDAL PITUITARY RESECTION WITH NAVIGATION      Patient: [name] White record number: [id number]  Date:06/04/2020  Pre-operative Indications: 1.  Pituitary Mass       Postoperative Indications: Same  Surgical Procedure: 1. Endoscopic transsphenoidal revision pituitary resection with intraoperative navigation    2.  Bilateral endoscopic sphenoidotomy with removal of tissue.      Anesthesia: GET  Surgeon: Delsa Bern, M.D.  Neurosurgeon: Drs. Elsner and Ostergard   Complications: None  EBL: 100 cc  Findings: Transseptal pituitary resection with obstruction of the sphenoid sinus ostia bilaterally.  Dense bone in the sphenoid sinus from previous reconstruction which was removed.  Abnormal tissue consistent with tumor extending inferiorly into the clivus. Naso-pore sphenoid packing placed.  Note: The neurosurgical component of the operative procedure is dictated as a separate operative note.   Brief History: The patient is a 79 y.o. female with a history of pituitary mass. The patient has a history of previous transseptal pituitary resection performed by Dr. Ellene White and Dr. Erik White approximately 20 years ago.  The patient was stable on follow-up without evidence of recurrent disease.  She underwent CT scan after a syncopal episode and concerns were raised regarding recurrent tumor.  The patient continued to have significant visual changes over time.. The patient was referred to Dr. Dr. Ellene White for neurosurgical evaluation.  Patient seen by me at [facility] preoperatively with review of nasal anatomy and sinus CT scan for navigation.  Given the patient's history and findings, the above surgical procedures were recommended, risks and benefits were discussed in detail with the patient.  They understand and agree with our plan for surgery which is scheduled at [facility] under general anesthesia.  Surgical Procedure: The  patient is brought to the neurosurgical operating room on 06/04/2020 and placed in supine position on the operating table. General endotracheal anesthesia was established without difficulty. When the patient was adequately anesthetized, surgical timeout was performed with correct identification of the patient and the surgical procedure. The patient's nose was then injected with 7 cc of 1% lidocaine 1:100,000 dilution epinephrine which was injected in a submucosal fashion. The patient's nose was then packed with Afrin-soaked cottonoid pledgets were left in place for approximately 10 minutes to allow for vasoconstriction and hemostasis.  The Xomed Fusion navigation headgear was applied and anatomic and surgical landmarks were identified and confirmed, navigation was used throughout the sinus component of the surgical procedure.  With the patient prepped draped and prepared for surgery, nasal endoscopy was performed on the patient's right.  The middle turbinate was carefully lateralized to allow access to the posterior aspect of the nasal passageway.  The right sphenoid sinus ostium was identified.  The inferior aspect of the superior turbinate was then resected with through-cutting forceps and a microdebrider.  The right sphenoid sinus ostium was enlarged in a superior and lateral direction using the microdebrider and through-cutting forceps to create a widely patent ostium.  Nasal endoscopy on the patient's left-hand side was then undertaken.  The left middle turbinate was lateralized and the posterior nasal cavity was visualized with identification of the left sphenoid sinus ostium using navigation.  The inferior aspect of the superior turbinate was resected and the sinus ostium was enlarged in the lateral and superior direction to create a wide sphenoid sinus ostium.  A posterior septectomy was then performed with a Surveyor, quantity.  Bone, cartilage and soft tissue was then resected to create a wide  posterior  septotomy.  The anterior face of the sphenoid sinus and sphenoid sinus septum were then resected with a combination of through-cutting forceps, osteotome and microdebrider to allow direct access to the entire posterior aspect of the sphenoid sinus and pituitary fossa.  With opening of the anterior face of the sphenoid there was a large bony prominence which most likely reflected the previous bony reconstruction.  This was removed allowing access to the sphenoid sinus proper.  Within the inferior aspect of the sphenoid sinus and extending into the clivus abnormal tissue consistent with pituitary tumor was encountered.    With adequate access to the pituitary fossa the neurosurgical component of the procedure was begun by Dr. Dr. Venetia White and Dr. Ellene White.  This is dictated as a separate operative report.  Resection of the pituitary tumor was undertaken using direct visualization of the 0 degree endoscope, navigation and blunt and sharp dissection.  With pituitary tumor resection completed, reconstruction was undertaken.  There was minimal CSF leak and no significant bleeding.  The clival defect and anterior aspect of the pituitary fossa were treated with FloSeal followed by Adheris neurosurgical glue.  Surgicel was placed over the pituitary fossa defect and the sphenoid sinus was loosely packed with Naso-pore absorbable nasal packing.  There was no active bleeding and no evidence of spinal fluid leak.  The sphenoid sinus was carefully inspected, no further bleeding along the mucosal margins, sphenoidotomy sites or posterior septectomy.  The patient's nasal cavity was irrigated and suctioned.  Surgical sponge count was correct. An oral gastric tube was passed and the stomach contents were aspirated. Patient was awakened from anesthetic and transferred from the operating room to the recovery room in stable condition. There were no complications and blood loss was 100 cc.   Delsa Bern, M.D. [facility]  ENT 06/04/2020

## 2020-06-05 ENCOUNTER — Encounter (HOSPITAL_COMMUNITY): Payer: Self-pay | Admitting: Neurological Surgery

## 2020-06-05 LAB — BASIC METABOLIC PANEL
Anion gap: 5 (ref 5–15)
BUN: 10 mg/dL (ref 8–23)
CO2: 25 mmol/L (ref 22–32)
Calcium: 8.1 mg/dL — ABNORMAL LOW (ref 8.9–10.3)
Chloride: 105 mmol/L (ref 98–111)
Creatinine, Ser: 0.62 mg/dL (ref 0.44–1.00)
GFR calc Af Amer: >60 mL/min (ref >60)
GFR calc non Af Amer: >60 mL/min (ref >60)
Glucose, Bld: 189 mg/dL — ABNORMAL HIGH (ref 70–99)
Potassium: 4 mmol/L (ref 3.5–5.1)
Sodium: 135 mmol/L (ref 135–145)

## 2020-06-05 LAB — CBC
HCT: 38.7 % (ref 36.0–46.0)
Hemoglobin: 12.5 g/dL (ref 12.0–15.0)
MCH: 30.2 pg (ref 26.0–34.0)
MCHC: 32.3 g/dL (ref 30.0–36.0)
MCV: 93.5 fL (ref 80.0–100.0)
Platelets: 233 10*3/uL (ref 150–400)
RBC: 4.14 MIL/uL (ref 3.87–5.11)
RDW: 14.1 % (ref 11.5–15.5)
WBC: 15.8 10*3/uL — ABNORMAL HIGH (ref 4.0–10.5)
nRBC: 0 % (ref 0.0–0.2)

## 2020-06-05 LAB — GLUCOSE, CAPILLARY
Glucose-Capillary: 193 mg/dL — ABNORMAL HIGH (ref 70–99)
Glucose-Capillary: 187 mg/dL — ABNORMAL HIGH (ref 70–99)
Glucose-Capillary: 109 mg/dL — ABNORMAL HIGH (ref 70–99)
Glucose-Capillary: 156 mg/dL — ABNORMAL HIGH (ref 70–99)

## 2020-06-05 LAB — SURGICAL PATHOLOGY

## 2020-06-05 NOTE — Progress Notes (Signed)
A-line not providing accurate readings due to being positional.  Will continue monitoring cuff BP

## 2020-06-05 NOTE — Progress Notes (Signed)
Inpatient Diabetes Program Recommendations  AACE/ADA: New Consensus Statement on Inpatient Glycemic Control (2015)  Target Ranges:  Prepandial:   less than 140 mg/dL      Peak postprandial:   less than 180 mg/dL (1-2 hours)      Critically ill patients:  140 - 180 mg/dL   Lab Results  Component Value Date   GLUCAP 187 (H) 06/05/2020   HGBA1C 7.5 (H) 06/01/2020    Review of Glycemic Control Results for [name] White, [name] White (MRN [id number]) as of 06/05/2020 12:22  Ref. Range 06/04/2020 14:21 06/04/2020 17:48 06/04/2020 21:59 06/05/2020 07:18 06/05/2020 11:09  Glucose-Capillary Latest Ref Range: 70 - 99 mg/dL 169 (H) 194 (H) 179 (H) 193 (H) 187 (H)   Diabetes history: DM 2 Outpatient Diabetes medications:  Metformin 1000 mg with breakfast, Prandin 1 mg with breakfast, 4 mg with lunch and dinner, Victoza 1.8 mg daily, Farxiga (dapagliflozin propanediol) 5 mg with breakfast Current orders for Inpatient glycemic control:  Prandin 1 mg with breakfast, Prandin 4 mg with lunch and supper Metformin 1000 mg daily Farxiga 5 mg daily  Inpatient Diabetes Program Recommendations:    Please add Novolog moderate correction tid with meals and HS.    Thanks  Adah Perl, RN, BC-ADM Inpatient Diabetes Coordinator Pager [phone] (8a-5p)

## 2020-06-06 LAB — GLUCOSE, CAPILLARY
Glucose-Capillary: 162 mg/dL — ABNORMAL HIGH (ref 70–99)
Glucose-Capillary: 208 mg/dL — ABNORMAL HIGH (ref 70–99)

## 2020-06-06 MED ORDER — DOCUSATE SODIUM 100 MG PO CAPS: 100.0000 mg | ORAL_CAPSULE | Freq: Two times a day (BID) | ORAL | 0 refills | Status: DC

## 2020-06-06 NOTE — Discharge Instructions (Addendum)
Drink plenty of water when thirsty and always have access to water. Call the office if worsening headaches, n/v, or feeling ill Vicodin prescription and colace prescription has been sent to your pharmacy in [place].   Sinus/Nasal Instructions:  1. Limited activity 2. Liquid and soft diet 3. May bathe and shower 4. Saline nasal spray - 4 puffs/nostril every hour while awake, begin the morning after surgery 5. Elevate Head of Bed 6. No nose blowing/Open mouth sneeze   Call [facility] ENT for any questions or concerns regarding nasal issues: [phone]

## 2020-06-06 NOTE — Evaluation (Signed)
Physical Therapy Evaluation Patient Details Name: [name] White MRN: [id number] DOB: [birth year] Today's Date: 06/06/2020   History of Present Illness  79 y.o. female who underwent elective redo transsphenoidal surgery for pituitary tumor regrowth.  Clinical Impression  PT eval complete. Pt is modified independent with bed mobility and transfers. Supervision provided for ambulation 250 feet without AD. No physical assist needed. No LOB noted. LE strength intact. Mild balance deficits noted related to vision. No further PT intervention indicated. PT signing off.    Follow Up Recommendations No PT follow up;Supervision for mobility/OOB    Equipment Recommendations  None recommended by PT    Recommendations for Other Services       Precautions / Restrictions Precautions Precautions: Fall      Mobility  Bed Mobility Overal bed mobility: Modified Independent                Transfers Overall transfer level: Modified independent Equipment used: None                Ambulation/Gait Ambulation/Gait assistance: Supervision Gait Distance (Feet): 250 Feet Assistive device: None Gait Pattern/deviations: Step-through pattern;Decreased stride length Gait velocity: mildly decreased Gait velocity interpretation: >2.62 ft/sec, indicative of community ambulatory General Gait Details: mildly unsteady resulting in slower cadence. Visual deficits resulting in difficulty with managing obstacles on right. Pt did not run into anything but stayed very close to obstacles on right.  Stairs            Wheelchair Mobility    Modified Rankin (Stroke Patients Only)       Balance Overall balance assessment: Mild deficits observed, not formally tested                                           Pertinent Vitals/Pain Pain Assessment: Faces Faces Pain Scale: Hurts a little bit Pain Location: head Pain Descriptors / Indicators: Headache Pain Intervention(s):  Monitored during session    Home Living Family/patient expects to be discharged to:: Private residence Living Arrangements: Spouse/significant other Available Help at Discharge: Family;Available 24 hours/day Type of Home: House Home Access: Ramped entrance;Stairs to enter Entrance Stairs-Rails: Right Entrance Stairs-Number of Steps: 3 Home Layout: Laundry or work area in [place]: Kasandra Knudsen - single point      Prior Function Level of Independence: Independent               Hand Dominance        Extremity/Trunk Assessment   Upper Extremity Assessment Upper Extremity Assessment: Defer to OT evaluation    Lower Extremity Assessment Lower Extremity Assessment: Overall WFL for tasks assessed    Cervical / Trunk Assessment Cervical / Trunk Assessment: Kyphotic  Communication   Communication: No difficulties  Cognition Arousal/Alertness: Awake/alert Behavior During Therapy: WFL for tasks assessed/performed Overall Cognitive Status: Within Functional Limits for tasks assessed                                        General Comments General comments (skin integrity, edema, etc.): BP 93/67 in recliner prior to amb and 104/70 in recliner after ambulation.    Exercises     Assessment/Plan    PT Assessment Patent does not need any further PT services  PT Problem List  PT Treatment Interventions      PT Goals (Current goals can be found in the Care Plan section)  Acute Rehab PT Goals Patient Stated Goal: home PT Goal Formulation: All assessment and education complete, DC therapy    Frequency     Barriers to discharge        Co-evaluation               AM-PAC PT "6 Clicks" Mobility  Outcome Measure Help needed turning from your back to your side while in a flat bed without using bedrails?: None Help needed moving from lying on your back to sitting on the side of a flat bed without using bedrails?: None Help needed  moving to and from a bed to a chair (including a wheelchair)?: None Help needed standing up from a chair using your arms (e.g., wheelchair or bedside chair)?: None Help needed to walk in hospital room?: None Help needed climbing 3-5 steps with a railing? : A Little 6 Click Score: 23    End of Session Equipment Utilized During Treatment: Gait belt Activity Tolerance: Patient tolerated treatment well Patient left: in chair;with call bell/phone within reach;with family/visitor present Nurse Communication: Mobility status PT Visit Diagnosis: Unsteadiness on feet (R26.81)    Time: 0459-9774 PT Time Calculation (min) (ACUTE ONLY): 29 min   Charges:   PT Evaluation $PT Eval Moderate Complexity: 1 Mod          Lorrin Goodell, PT  Office # [phone] Pager [phone]   Lorriane Shire 06/06/2020, 12:26 PM

## 2020-06-06 NOTE — Plan of Care (Signed)
Reviewed plan of care with patient and daughter. Verbalized understanding of discharge summary. Patient capabilities adequate for discharge.

## 2020-06-06 NOTE — Evaluation (Addendum)
Occupational Therapy Evaluation Patient Details Name: [name] White MRN: [id number] DOB: 1941-06-06 Today's Date: 06/06/2020    History of Present Illness 79 y.o. female who underwent elective redo transsphenoidal surgery for pituitary tumor regrowth.   Clinical Impression   Patient evaluated by Occupational Therapy with no further acute OT needs identified. All education has been completed and the patient has no further questions. Pt is able to perform ADLs with supervision.  She demonstrates significant visual deficits including bi temporal field deficits, impaired acuity, and intermittent diplopia.  She did benefit from temporal occlusion of glasses to reduce diplopia.  Reviewed safety and compensatory strategies with pt and family.  Pt has been compensating for visual changes for several months and does not want follow up OT at this time. Did discuss that she may benefit from OPOT at follow up if vision not improved. She is aware that she is not to drive.  See below for any follow-up Occupational Therapy or equipment needs. OT is signing off. Thank you for this referral.      Follow Up Recommendations  No OT follow up;Supervision/Assistance - 24 hour    Equipment Recommendations  None recommended by OT    Recommendations for Other Services       Precautions / Restrictions Precautions Precautions: Fall      Mobility Bed Mobility Overal bed mobility: Modified Independent                Transfers Overall transfer level: Modified independent Equipment used: None                  Balance Overall balance assessment: Mild deficits observed, not formally tested                                         ADL either performed or assessed with clinical judgement   ADL Overall ADL's : Needs assistance/impaired Eating/Feeding: Independent   Grooming: Wash/dry hands;Wash/dry face;Oral care;Brushing hair;Set up;Supervision/safety;Standing   Upper Body  Bathing: Set up;Sitting   Lower Body Bathing: Supervison/ safety;Sit to/from stand   Upper Body Dressing : Set up;Supervision/safety;Sitting   Lower Body Dressing: Set up;Sit to/from stand   Toilet Transfer: Supervision/safety   Toileting- Water quality scientist and Hygiene: Supervision/safety;Sit to/from Nurse, children's Details (indicate cue type and reason): recommend pt sit to shower to reduce risk of falls  Functional mobility during ADLs: Supervision/safety       Vision Baseline Vision/History: Wears glasses Wears Glasses: At all times Patient Visual Report: Blurring of vision;Diplopia;Peripheral vision impairment Vision Assessment?: Yes Eye Alignment: Within Functional Limits Ocular Range of Motion: Within Functional Limits Alignment/Gaze Preference: Within Defined Limits Tracking/Visual Pursuits: Able to track stimulus in all quads without difficulty Visual Fields: Other (comment) (bi temporal visual field deficits ) Diplopia Assessment: Objects split on top of one another Additional Comments: Pt reports decreased acuity Rt eye.  She reports visual field testing by ophthalmologist revealed bi temporal field losses.  She also reports vertical diplopia.  Pt reports visual deficits have been present in varying degrees for ~ 6 mos      Perception Perception Perception Tested?: Yes   Praxis Praxis Praxis tested?: Within functional limits    Pertinent Vitals/Pain Pain Assessment: Faces Faces Pain Scale: Hurts a little bit Pain Location: head Pain Descriptors / Indicators: Headache Pain Intervention(s): Monitored during session     Hand Dominance Right  Extremity/Trunk Assessment Upper Extremity Assessment Upper Extremity Assessment: Overall WFL for tasks assessed   Lower Extremity Assessment Lower Extremity Assessment: Overall WFL for tasks assessed   Cervical / Trunk Assessment Cervical / Trunk Assessment: Kyphotic   Communication  Communication Communication: No difficulties   Cognition Arousal/Alertness: Awake/alert Behavior During Therapy: WFL for tasks assessed/performed Overall Cognitive Status: Within Functional Limits for tasks assessed                                 General Comments: Pt is very good historian    General Comments  VSS.  Pt reports she has been dealing with her deficits for several months, and has been compensating well.  She does not wish for follow up OT at discharge, but I did advise she and daughters if visual deficits persisted, that an OPOT consult may be beneficial     Exercises Exercises: Other exercises Other Exercises Other Exercises: Rt temporal occlusion applied to Lenses to reduce diplopia.  Pt reports improvement of vision - she and daughters were instructed how to tape glassess.  Other Exercises: Discussed recommendation for no driving, direct supervision with medication management, financial management and they verbalized understanding.  Discussed compensatory strategies for vision including scanning environment upon entering, and having someone walk on her Right when outside or in the community.     Shoulder Instructions      Home Living Family/patient expects to be discharged to:: Private residence Living Arrangements: Spouse/significant other Available Help at Discharge: Family;Available 24 hours/day Type of Home: House Home Access: Ramped entrance;Stairs to enter Entrance Stairs-Number of Steps: 3 Entrance Stairs-Rails: Right Home Layout: Laundry or work area in basement     [employer] Shower/Tub: Teacher, early years/pre: [place]: [employer] - single point;Shower seat          Prior Functioning/Environment Level of Independence: Independent        Comments: Pt has stopped driving due to vision changes.  She reports increased difficulty with managing finances due to vision changes         OT Problem List: Decreased  activity tolerance;Impaired vision/perception;Decreased safety awareness      OT Treatment/Interventions:      OT Goals(Current goals can be found in the care plan section) Acute Rehab OT Goals Patient Stated Goal: for vision to improve  OT Goal Formulation: All assessment and education complete, DC therapy  OT Frequency:     Barriers to D/C:            Co-evaluation PT/OT/SLP Co-Evaluation/Treatment: Yes Reason for Co-Treatment: For patient/therapist safety;To address functional/ADL transfers   OT goals addressed during session: ADL's and self-care      AM-PAC OT "6 Clicks" Daily Activity     Outcome Measure Help from another person eating meals?: None Help from another person taking care of personal grooming?: A Little Help from another person toileting, which includes using toliet, bedpan, or urinal?: A Little Help from another person bathing (including washing, rinsing, drying)?: A Little Help from another person to put on and taking off regular upper body clothing?: A Little Help from another person to put on and taking off regular lower body clothing?: A Little 6 Click Score: 19   End of Session Nurse Communication: Mobility status  Activity Tolerance: Patient tolerated treatment well Patient left: in chair;with call bell/phone within reach;with family/visitor present  OT Visit Diagnosis: Low vision, both  eyes (H54.2)                Time: 1133-1209 OT Time Calculation (min): 36 min Charges:  OT General Charges $OT Visit: 1 Visit OT Evaluation $OT Eval Moderate Complexity: 1 Mod  Nilsa Nutting., OTR/L Acute Rehabilitation Services Pager [phone] Office [phone]   Lucille Passy M 06/06/2020, 1:39 PM

## 2020-06-06 NOTE — Discharge Summary (Signed)
  Physician Discharge Summary  Patient ID: [name] White MRN: [id number] DOB/AGE: 03/12/1941 79 y.o.  Admit date: 06/04/2020 Discharge date: 06/06/2020  Admission Diagnoses:  Pituitary tumor  Discharge Diagnoses:  Same Active Problems:   Status post transsphenoidal pituitary resection [facility])   Pituitary adenoma with extrasellar extension [facility])   Discharged Condition: Stable  Hospital Course:  [name] White is a 79 y.o. female who underwent elective redo transsphenoidal surgery for pituitary tumor regrowth.  She tolerated the surgery well.  Postoperatively, she had no evidence of DI or adrenal insufficiency.  She was mobilized, was voiding without difficulty, and her pain was well controlled.  She was deemed ready for discharge home on 6/12 with follow-up with ENT and Dr. Ellene Route, as well as strict instructions on fluid repletion as well as looking for signs of adrenal insufficiency.   Discharge Exam: Blood pressure 101/63, pulse 94, temperature 98.3 F (36.8 C), temperature source Oral, resp. rate 17, height 5\' 5"  (1.651 m), weight 75.1 kg, SpO2 (!) 89 %. Awake, alert, oriented Complains of blurry vision, but is able to count fingers in her visual fields. No drainage from her nose. Speech fluent, appropriate CN grossly intact 5/5 BUE/BLE Wound c/d/i  Disposition: Home     Follow-up Information    Jerrell Belfast, MD In 2 weeks.   Specialty: Otolaryngology Contact information: [address] [place] [address] [phone]        Kristeen Miss, MD. Schedule an appointment as soon as possible for a visit in 10 day(s).   Specialty: Neurosurgery Contact information: [address] [place] [address] [phone]               Signed: Vallarie Mare 06/06/2020, 11:12 AM

## 2020-06-06 NOTE — Progress Notes (Signed)
Discharge Summary reviewed with patient and daughter, [name] White. Questions answered. All belongings accounted for. Patient transported to car via wheelchair.

## 2020-06-06 NOTE — Progress Notes (Signed)
   ENT Progress Note: POD #2 s/p Procedure(s): Endoscopic Transphenoidal Resection of Recurrent PituitaryTumor TRANSNASAL APPROACH   Subjective: C/O headache  Objective: Vital signs in last 24 hours: Temp:  [98.3 F (36.8 C)-98.7 F (37.1 C)] 98.3 F (36.8 C) (06/12 0800) Pulse Rate:  [48-99] 86 (06/12 0700) Resp:  [15-27] 17 (06/12 0700) BP: (83-115)/(55-78) 115/73 (06/12 0700) SpO2:  [90 %-99 %] 96 % (06/12 0700) Weight change:  Last BM Date: 06/03/20  Intake/Output from previous day: 06/11 0701 - 06/12 0700 In: 155.8 [I.V.:155.8] Out: 175 [Urine:175] Intake/Output this shift: No intake/output data recorded.  Labs: Recent Labs    06/05/20 0606  WBC 15.8*  HGB 12.5  HCT 38.7  PLT 233   Recent Labs    06/05/20 0606  NA 135  K 4.0  CL 105  CO2 25  GLUCOSE 189*  BUN 10  CALCIUM 8.1*    Studies/Results: No results found.   PHYSICAL EXAM: Patient neurologically intact. Normal extraocular mobility and no worsening in vision. No nasal discharge.   Assessment/Plan: Patient stable from an ENT perspective after revision transsphenoidal pituitary resection.  Continue frequent nasal saline spray and oral antibiotics.  Plan follow-up at [facility] ENT in approximately 2 weeks for postoperative recheck.  Plan discharge per neurosurgical service.    Jerrell Belfast 06/06/2020, 8:50 AM

## 2020-06-08 ENCOUNTER — Other Ambulatory Visit (HOSPITAL_COMMUNITY): Payer: Medicare Other

## 2020-06-15 ENCOUNTER — Ambulatory Visit: Payer: Medicare Other | Admitting: Endocrinology

## 2020-06-19 ENCOUNTER — Other Ambulatory Visit: Payer: Self-pay | Admitting: Neurological Surgery

## 2020-06-19 ENCOUNTER — Other Ambulatory Visit (HOSPITAL_COMMUNITY): Payer: Self-pay | Admitting: Neurological Surgery

## 2020-06-19 DIAGNOSIS — D497 Neoplasm of unspecified behavior of endocrine glands and other parts of nervous system: Secondary | ICD-10-CM

## 2020-06-25 ENCOUNTER — Ambulatory Visit: Payer: Medicare Other | Admitting: Endocrinology

## 2020-06-25 ENCOUNTER — Other Ambulatory Visit: Payer: Self-pay

## 2020-06-25 ENCOUNTER — Encounter: Payer: Self-pay | Admitting: Endocrinology

## 2020-06-25 VITALS — BP 102/64 | HR 96 | Ht 65.0 in | Wt 158.6 lb

## 2020-06-25 DIAGNOSIS — D352 Benign neoplasm of pituitary gland: Secondary | ICD-10-CM

## 2020-06-25 LAB — POCT GLYCOSYLATED HEMOGLOBIN (HGB A1C): Hemoglobin A1C: 7 % — AB (ref 4.0–5.6)

## 2020-06-25 LAB — BASIC METABOLIC PANEL
BUN: 10 mg/dL (ref 6–23)
CO2: 29 mEq/L (ref 19–32)
Calcium: 9.3 mg/dL (ref 8.4–10.5)
Chloride: 105 mEq/L (ref 96–112)
Creatinine, Ser: 0.76 mg/dL (ref 0.40–1.20)
GFR: 73.45 mL/min (ref >60.00)
Glucose, Bld: 118 mg/dL — ABNORMAL HIGH (ref 70–99)
Potassium: 4.6 mEq/L (ref 3.5–5.1)
Sodium: 141 mEq/L (ref 135–145)

## 2020-06-25 LAB — URINALYSIS, ROUTINE W REFLEX MICROSCOPIC
Bilirubin Urine: NEGATIVE
Hgb urine dipstick: NEGATIVE
Ketones, ur: NEGATIVE
Leukocytes,Ua: NEGATIVE
Nitrite: NEGATIVE
RBC / HPF: NONE SEEN (ref 0–?)
Specific Gravity, Urine: 1.02 (ref 1.000–1.030)
Total Protein, Urine: NEGATIVE
Urine Glucose: >=1000 — AB
Urobilinogen, UA: 0.2 (ref 0.0–1.0)
pH: 5.5 (ref 5.0–8.0)

## 2020-06-25 LAB — T4, FREE: Free T4: 0.7 ng/dL (ref 0.60–1.60)

## 2020-06-25 LAB — LUTEINIZING HORMONE: LH: 1.77 m[IU]/mL

## 2020-06-25 LAB — FOLLICLE STIMULATING HORMONE: FSH: 15.6 m[IU]/mL

## 2020-06-25 LAB — CORTISOL: Cortisol, Plasma: 10.5 ug/dL

## 2020-06-25 LAB — TSH: TSH: 0.99 u[IU]/mL (ref 0.35–4.50)

## 2020-06-25 NOTE — Patient Instructions (Addendum)
Blood tests are requested for you today.  We'll let you know about the results.  Please continue the same medications. check your blood sugar once a day.  vary the time of day when you check, between before the 3 meals, and at bedtime.  also check if you have symptoms of your blood sugar being too high or too low.  please keep a record of the readings and bring it to your next appointment here (or you can bring the meter itself).  You can write it on any piece of paper.  please call us sooner if your blood sugar goes below 70, or if you have a lot of readings over 200. Please come back for a follow-up appointment in 4 months.

## 2020-06-25 NOTE — Progress Notes (Signed)
Subjective:    Patient ID: [name] White, female    DOB: May 03, 1941, 79 y.o.   MRN: [id number]  HPI Pt returns for f/u of diabetes mellitus:  DM type: 2 Dx'ed: 1999, after an episode of pancreatitis (no pancreatitis since then).   Complications: PN Therapy: 3 oral meds + Victoza.  GDM: never DKA: never Severe hypoglycemia: never.    Pancreatic Imaging (2002): recurrent pancreatitis.  Other: she has never taken insulin; she cannot take bromocriptine, due to an interaction with maxalt; pioglitizone has caused edema; fructosamine has converted to lower A1c than A1c itself; GLP med is used, as pancreatitis was many years ago. Interval history: pt says cbg's are well-controlled She had resection of pit tumor in 6/21. Past Medical History:  Diagnosis Date  . Anxiety   . Asthma   . ASYMPTOMATIC POSTMENOPAUSAL STATUS 02/09/2009  . Cataract   . Constipation 03/19/2013  . Depression   . DIABETES MELLITUS, TYPE II 09/14/2007  . HYPERCHOLESTEROLEMIA 02/09/2009  . HYPERTENSION 02/09/2009  . HYPOTHYROIDISM 09/14/2007  . MIGRAINE HEADACHE 09/14/2007  . Neuropathy   . OSTEOPOROSIS 02/09/2009  . PANCREATITIS 09/14/2007  . 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 PONV (postoperative nausea and vomiting)   . Rectocele 03/19/2013  . Shortness of breath   . SUPERFICIAL PHLEBITIS 09/14/2007  . Varicose veins     Past Surgical History:  Procedure Laterality Date  . ABDOMINAL HYSTERECTOMY    . ANTERIOR AND POSTERIOR REPAIR N/A 04/02/2013   Procedure: ANTERIOR (CYSTOCELE) AND POSTERIOR REPAIR (RECTOCELE);  Surgeon: Jonnie Kind, MD;  Location: [facility];  Service: Gynecology;  Laterality: N/A;  . APPENDECTOMY    . BRAIN SURGERY    . CATARACT EXTRACTION W/PHACO  12/05/2011   Procedure: CATARACT EXTRACTION PHACO AND INTRAOCULAR LENS PLACEMENT (IOC);  Surgeon: Tonny Branch;  Location: [facility];  Service: Ophthalmology;  Laterality: Left;  CDE:9.65  . CHOLECYSTECTOMY    . CRANIOTOMY N/A 06/04/2020   Procedure:  Endoscopic Transphenoidal Resection of Recurrent PituitaryTumor;  Surgeon: Kristeen Miss, MD;  Location: [facility];  Service: Neurosurgery;  Laterality: N/A;  . CYSTOSCOPY    . ENDOVENOUS ABLATION SAPHENOUS VEIN W/ LASER  11-03-2011   right greater saphenous vein   left leg done 10-2011  . EYE SURGERY  98   right cataract extraction 98  . LAPAROSCOPIC NISSEN FUNDOPLICATION    . NM ESOPHAGEAL REFLUX  08-11-11  . PITUITARY EXCISION  10/1997  . POSTERIOR REPAIR    . TRANSNASAL APPROACH N/A 06/04/2020   Procedure: TRANSNASAL APPROACH;  Surgeon: Jerrell Belfast, MD;  Location: [facility];  Service: ENT;  Laterality: N/A;  . TRANSPHENOIDAL / TRANSNASAL HYPOPHYSECTOMY / RESECTION PITUITARY TUMOR  08-11-11    Social History   Socioeconomic History  . Marital status: Married    Spouse name: Not on file  . Number of children: Not on file  . Years of education: Not on file  . Highest education level: Not on file  Occupational History  . Occupation: Retired    Fish farm manager: RETIRED  Tobacco Use  . Smoking status: Never Smoker  . Smokeless tobacco: Never Used  Vaping Use  . Vaping Use: Never used  Substance and Sexual Activity  . Alcohol use: No    Alcohol/week: 0.0 standard drinks  . Drug use: No  . Sexual activity: Yes    Birth control/protection: Surgical  Other Topics Concern  . Not on file  Social History Narrative  . Not on file   Social Determinants of Health  Financial Resource Strain:   . Difficulty of Paying Living Expenses:   Food Insecurity:   . Worried About Charity fundraiser in the Last Year:   . Arboriculturist in the Last Year:   Transportation Needs:   . Film/video editor (Medical):   Marland Kitchen Lack of Transportation (Non-Medical):   Physical Activity:   . Days of Exercise per Week:   . Minutes of Exercise per Session:   Stress:   . Feeling of Stress :   Social Connections:   . Frequency of Communication with Friends and Family:   . Frequency of Social Gatherings  with Friends and Family:   . Attends Religious Services:   . Active Member of Clubs or Organizations:   . Attends Archivist Meetings:   Marland Kitchen Marital Status:   Intimate Partner Violence:   . Fear of Current or Ex-Partner:   . Emotionally Abused:   Marland Kitchen Physically Abused:   . Sexually Abused:     Current Outpatient Medications on File Prior to Visit  Medication Sig Dispense Refill  . ACCU-CHEK AVIVA PLUS test strip TEST TWICE DAILY. 100 strip 0  . albuterol (PROVENTIL HFA;VENTOLIN HFA) 108 (90 BASE) MCG/ACT inhaler Inhale 2 puffs into the lungs every 4 (four) hours as needed for wheezing or shortness of breath.     Marland Kitchen aspirin 81 MG tablet Take 81 mg by mouth daily.    Marland Kitchen atorvastatin (LIPITOR) 40 MG tablet TAKE ONE (1) TABLET BY MOUTH EVERY DAY (Patient taking differently: Take 40 mg by mouth at bedtime. ) 90 tablet 3  . baclofen (LIORESAL) 10 MG tablet Take 10 mg by mouth daily.    Marland Kitchen BREO ELLIPTA 200-25 MCG/INH AEPB Inhale 1 puff into the lungs daily.     . carvedilol (COREG) 3.125 MG tablet Take 1 tablet (3.125 mg total) by mouth in the morning and at bedtime. 180 tablet 3  . cycloSPORINE (RESTASIS) 0.05 % ophthalmic emulsion Place 1 drop into both eyes 2 (two) times daily.      . dapagliflozin propanediol (FARXIGA) 5 MG TABS tablet Take 2.5 mg by mouth daily before breakfast. (Patient taking differently: Take 5 mg by mouth daily before breakfast. ) 15 tablet 11  . docusate sodium (COLACE) 100 MG capsule Take 1 capsule (100 mg total) by mouth 2 (two) times daily. 10 capsule 0  . estradiol (ESTRACE) 1 MG tablet TAKE ONE TABLET (1MG  TOTAL) BY MOUTH DAILY (Patient taking differently: Take 1 mg by mouth daily. ) 90 tablet 1  . furosemide (LASIX) 20 MG tablet May take Lasix 20 mg as NEEDED for leg swelling 45 tablet 3  . HYDROcodone-acetaminophen (NORCO/VICODIN) 5-325 MG tablet Take 1 tablet by mouth every 6 (six) hours as needed for moderate pain.     . Insulin Pen Needle (NOVOTWIST) 32G X  5 MM MISC Use as directed once a day.   V250.00 100 each 6  . levothyroxine (SYNTHROID) 50 MCG tablet TAKE ONE TABLET (50MCG TOTAL) BY MOUTH DAILY BEFORE BREAKFAST (Patient taking differently: Take 50 mcg by mouth daily before breakfast. ) 90 tablet 2  . lisinopril (ZESTRIL) 10 MG tablet Take 10 mg by mouth daily.     . metFORMIN (GLUCOPHAGE-XR) 500 MG 24 hr tablet TAKE TWO (2) TABLETS BY MOUTH DAILY (Patient taking differently: Take 1,000 mg by mouth daily with breakfast. ) 180 tablet 3  . methocarbamol (ROBAXIN) 500 MG tablet Take 1 tablet (500 mg total) by mouth 2 (two) times  daily. 20 tablet 0  . Multiple Vitamins-Minerals (MULTIVITAMINS THER. W/MINERALS) TABS Take 1 tablet by mouth every morning.      Marland Kitchen omeprazole (PRILOSEC) 20 MG capsule Take 20 mg by mouth every morning.     . repaglinide (PRANDIN) 1 MG tablet TAKE 1 TABLET BY MOUTH WITH BREAKFAST, 4 TABLETS WITH TABLETS WITH LUNCH, AND 4TABLETS WITH SUPPER (Patient taking differently: Take 1-4 mg by mouth See admin instructions. Take 1 mg by mouth in the morning with breakfast, 4 mg with lunch and 4 mg with dinner) 810 tablet 3  . rizatriptan (MAXALT-MLT) 10 MG disintegrating tablet Take 10 mg by mouth as needed for migraine. May repeat in 2 hours if needed    . traMADol (ULTRAM) 50 MG tablet Take 1 tablet (50 mg total) by mouth every 6 (six) hours as needed. 10 tablet 0  . venlafaxine (EFFEXOR-XR) 150 MG 24 hr capsule Take 150 mg by mouth every morning.     Marland Kitchen VICTOZA 18 MG/3ML SOPN INJECT 0.3MLS (1.8MG  TOTAL) INTO THE SKIN DAILY (Patient taking differently: Inject 1.8 mg into the skin daily. ) 9 mL 2   No current facility-administered medications on file prior to visit.    Allergies  Allergen Reactions  . Betadine [Povidone Iodine] Hives  . Penicillins Rash    Family History  Problem Relation Age of Onset  . Heart disease Mother   . Asthma Mother   . COPD Father   . Heart disease Father   . Stroke Father   . Asthma Daughter     . Migraines Daughter   . Neuropathy Son   . Cancer Neg Hx   . Anesthesia problems Neg Hx   . Hypotension Neg Hx   . Malignant hyperthermia Neg Hx   . Pseudochol deficiency Neg Hx     BP 102/64   Pulse 96   Ht 5\' 5"  (1.651 m)   Wt 158 lb 9.6 oz (71.9 kg)   SpO2 94%   BMI 26.39 kg/m   Review of Systems She denies hypoglycemia and polyuria.      Objective:   Physical Exam VITAL SIGNS:  See vs page GENERAL: no distress Pulses: dorsalis pedis intact bilat.   MSK: no deformity of the feet CV: trace bilat leg edema, and bilat vv's Skin:  no ulcer on the feet.  normal color and temp on the feet. Neuro: sensation is intact to touch on the feet.   Ext: there is bilateral onychomycosis of the toenails.    Lab Results  Component Value Date   HGBA1C 7.5 (H) 06/01/2020       Assessment & Plan:  Type 2 DM: well-controlled S/p resection of pituitary tumor.  Check pit function tests.   Patient Instructions  Blood tests are requested for you today.  We'll let you know about the results.  Please continue the same medications. check your blood sugar once a day.  vary the time of day when you check, between before the 3 meals, and at bedtime.  also check if you have symptoms of your blood sugar being too high or too low.  please keep a record of the readings and bring it to your next appointment here (or you can bring the meter itself).  You can write it on any piece of paper.  please call us sooner if your blood sugar goes below 70, or if you have a lot of readings over 200. Please come White for a follow-up appointment in 4 months.

## 2020-06-26 ENCOUNTER — Other Ambulatory Visit: Payer: Self-pay | Admitting: Obstetrics and Gynecology

## 2020-06-27 NOTE — Telephone Encounter (Signed)
PreGabalin 150 mg bid x 30 tabs sent to pharmacy. Refil x 5.

## 2020-06-30 LAB — EXTRA SPECIMEN

## 2020-06-30 LAB — ACTH: C206 ACTH: 13 pg/mL (ref 6–50)

## 2020-06-30 LAB — INSULIN-LIKE GROWTH FACTOR
IGF-I, LC/MS: 36 ng/mL (ref 34–245)
Z-Score (Female): -1.9 SD (ref ?–2.0)

## 2020-06-30 LAB — PROLACTIN: Prolactin: 9.2 ng/mL

## 2020-07-04 ENCOUNTER — Ambulatory Visit (HOSPITAL_COMMUNITY)
Admission: RE | Admit: 2020-07-04 | Discharge: 2020-07-04 | Disposition: A | Discharge status: Home / Self Care | Payer: Medicare Other | Source: Ambulatory Visit | Attending: Neurological Surgery | Admitting: Neurological Surgery

## 2020-07-04 DIAGNOSIS — I6782 Cerebral ischemia: Secondary | ICD-10-CM | POA: Diagnosis not present

## 2020-07-04 DIAGNOSIS — I739 Peripheral vascular disease, unspecified: Secondary | ICD-10-CM | POA: Diagnosis not present

## 2020-07-04 DIAGNOSIS — D352 Benign neoplasm of pituitary gland: Secondary | ICD-10-CM | POA: Diagnosis not present

## 2020-07-04 DIAGNOSIS — D497 Neoplasm of unspecified behavior of endocrine glands and other parts of nervous system: Secondary | ICD-10-CM | POA: Diagnosis not present

## 2020-07-04 DIAGNOSIS — J3489 Other specified disorders of nose and nasal sinuses: Secondary | ICD-10-CM | POA: Diagnosis not present

## 2020-07-09 DIAGNOSIS — D352 Benign neoplasm of pituitary gland: Secondary | ICD-10-CM | POA: Diagnosis not present

## 2020-07-09 DIAGNOSIS — H5340 Unspecified visual field defects: Secondary | ICD-10-CM | POA: Diagnosis not present

## 2020-07-09 DIAGNOSIS — H3589 Other specified retinal disorders: Secondary | ICD-10-CM | POA: Diagnosis not present

## 2020-07-10 ENCOUNTER — Other Ambulatory Visit: Payer: Self-pay | Admitting: Radiation Therapy

## 2020-07-10 LAB — ALPHA SUBUNIT (FREE): Alpha Subunit (Free): 0.59 ng/mL

## 2020-07-10 LAB — ARGININE VASOPRESSIN HORMONE
ADH: <0.8 pg/mL (ref 0.0–4.7)
Osmolality Meas: 287 mOsmol/kg (ref 280–301)

## 2020-07-27 ENCOUNTER — Other Ambulatory Visit: Payer: Self-pay | Admitting: Endocrinology

## 2020-07-28 ENCOUNTER — Ambulatory Visit: Payer: Medicare Other | Admitting: Family Medicine

## 2020-07-31 ENCOUNTER — Telehealth: Payer: Self-pay | Admitting: Adult Health

## 2020-07-31 NOTE — Telephone Encounter (Signed)
Patient called stating that she would like a call back from [place], pt states she has a question regarding something that is going on with her. Please contact pt

## 2020-07-31 NOTE — Telephone Encounter (Signed)
Left message I returned her call

## 2020-08-04 NOTE — Telephone Encounter (Signed)
Left message I called 

## 2020-08-12 ENCOUNTER — Other Ambulatory Visit: Payer: Self-pay | Admitting: Cardiology

## 2020-08-25 ENCOUNTER — Other Ambulatory Visit: Payer: Self-pay | Admitting: Endocrinology

## 2020-08-26 DIAGNOSIS — Z1329 Encounter for screening for other suspected endocrine disorder: Secondary | ICD-10-CM | POA: Diagnosis not present

## 2020-08-26 DIAGNOSIS — I10 Essential (primary) hypertension: Secondary | ICD-10-CM | POA: Diagnosis not present

## 2020-08-26 DIAGNOSIS — E039 Hypothyroidism, unspecified: Secondary | ICD-10-CM | POA: Diagnosis not present

## 2020-08-26 DIAGNOSIS — R7301 Impaired fasting glucose: Secondary | ICD-10-CM | POA: Diagnosis not present

## 2020-09-01 DIAGNOSIS — E039 Hypothyroidism, unspecified: Secondary | ICD-10-CM | POA: Diagnosis not present

## 2020-09-01 DIAGNOSIS — E1165 Type 2 diabetes mellitus with hyperglycemia: Secondary | ICD-10-CM | POA: Diagnosis not present

## 2020-09-01 DIAGNOSIS — J4540 Moderate persistent asthma, uncomplicated: Secondary | ICD-10-CM | POA: Diagnosis not present

## 2020-09-01 DIAGNOSIS — Z634 Disappearance and death of family member: Secondary | ICD-10-CM | POA: Diagnosis not present

## 2020-09-01 DIAGNOSIS — I10 Essential (primary) hypertension: Secondary | ICD-10-CM | POA: Diagnosis not present

## 2020-09-02 ENCOUNTER — Other Ambulatory Visit: Payer: Self-pay | Admitting: Radiation Therapy

## 2020-09-02 DIAGNOSIS — D352 Benign neoplasm of pituitary gland: Secondary | ICD-10-CM

## 2020-09-07 NOTE — Progress Notes (Signed)
Location/Histology of Brain Tumor:  06/04/2020 FINAL MICROSCOPIC DIAGNOSIS:  A. SINUS CONTENTS:  - Fragments of benign sinonasal mucosa, bone and cartilage  B. PITUITARY TUMOR, RESECTION:  - Consistent with pituitary adenoma  Patient presented with symptoms of:   Had pituitary surgery approximately 20 years ago.  She presented with significant visual loss and after surgery her vision improved substantially.  Her resection cavity was followed for several years and there is no evidence of recurrence.  Recently she had a near syncopal episode and a CT scan.  This demonstrated the presence of a large pituitary mass and on work-up it is evident that she has recurred her pituitary tumor and now invades the clivus significantly there is also a portion that extends into the suprasellar cistern and there is elevation of the optic structures.  Recent evaluation of her vision reveals that he has marked decreased in both eyes but is nearly blind in her right eye  MRI Brain W/O Contrast (scheduled for 3T-MRI on 09/09/20) 07/04/2020 IMPRESSION: 1. Status post debulking of large pituitary tumor. The greatest dimensions of the mass are unchanged, though there is a sizable defect of the anterior surface of the mass. 2. Unchanged bilateral cavernous sinus invasion and mass effect the optic chiasm. 3. Advanced chronic ischemic microangiopathy.  Past or anticipated interventions, if any, per neurosurgery:  06/04/2020 -Dr. Deatra Ina and Dr. Kristeen Miss Transnasal endoscopic resection of pituitary tumor with image guidance -Dr. Jerrell Belfast (ENT) 1. Endoscopic transsphenoidal revision pituitary resection with intraoperative navigation 2.  Bilateral endoscopic sphenoidotomy with removal of tissue.  Past or anticipated interventions, if any, per medical oncology: No referral made  Dose of Decadron, if applicable: Not currently prescribed  Recent neurologic symptoms, if any:   Seizures:  No  Headaches: No  Nausea: No  Dizziness/ataxia: Yes. When patient changes positions (sit to stand, stand to supine, bending over, etc), or if she tried to ambulate too quickly  Difficulty with hand coordination: No  Focal numbness/weakness: Minor weakness in legs and occasional lack of sensation in her feet  Visual deficits/changes: Yes, decrease in peripheral vision. More prominent in right eye than left (has tape of half of glasses on right eye to help).   Confusion/Memory deficits: Has trouble remembering some day to day things (like people's name), but no issues with long term memory  SAFETY ISSUES:  Prior radiation? No  Pacemaker/ICD? No  Possible current pregnancy? No--history hysterectomy  Is the patient on methotrexate? No  Additional Complaints / other details: Husband passed away August 11, 2020, so patient is still dealing with the loss and handling of personal affairs. Her change in vision has impacted her independence and she has to rely heavily on her children (2 daughters and 1 son) to help get to her to her appointments and run errands.

## 2020-09-08 ENCOUNTER — Encounter: Payer: Self-pay | Admitting: Radiation Oncology

## 2020-09-08 ENCOUNTER — Ambulatory Visit
Admission: RE | Admit: 2020-09-08 | Discharge: 2020-09-08 | Disposition: A | Discharge status: Home / Self Care | Payer: Medicare Other | Source: Ambulatory Visit | Attending: Radiation Oncology | Admitting: Radiation Oncology

## 2020-09-08 ENCOUNTER — Other Ambulatory Visit: Payer: Self-pay

## 2020-09-08 VITALS — BP 111/59 | HR 70 | Temp 97.8°F | Resp 20 | Ht 65.0 in | Wt 155.6 lb

## 2020-09-08 DIAGNOSIS — R410 Disorientation, unspecified: Secondary | ICD-10-CM | POA: Diagnosis not present

## 2020-09-08 DIAGNOSIS — M810 Age-related osteoporosis without current pathological fracture: Secondary | ICD-10-CM | POA: Diagnosis not present

## 2020-09-08 DIAGNOSIS — D352 Benign neoplasm of pituitary gland: Secondary | ICD-10-CM | POA: Insufficient documentation

## 2020-09-08 DIAGNOSIS — Z9889 Other specified postprocedural states: Secondary | ICD-10-CM | POA: Diagnosis not present

## 2020-09-08 DIAGNOSIS — I10 Essential (primary) hypertension: Secondary | ICD-10-CM | POA: Diagnosis not present

## 2020-09-08 DIAGNOSIS — E7800 Pure hypercholesterolemia, unspecified: Secondary | ICD-10-CM | POA: Insufficient documentation

## 2020-09-08 DIAGNOSIS — R0602 Shortness of breath: Secondary | ICD-10-CM | POA: Insufficient documentation

## 2020-09-08 DIAGNOSIS — H538 Other visual disturbances: Secondary | ICD-10-CM | POA: Insufficient documentation

## 2020-09-08 DIAGNOSIS — G629 Polyneuropathy, unspecified: Secondary | ICD-10-CM | POA: Insufficient documentation

## 2020-09-08 DIAGNOSIS — F418 Other specified anxiety disorders: Secondary | ICD-10-CM | POA: Insufficient documentation

## 2020-09-08 DIAGNOSIS — E119 Type 2 diabetes mellitus without complications: Secondary | ICD-10-CM | POA: Insufficient documentation

## 2020-09-08 DIAGNOSIS — K5900 Constipation, unspecified: Secondary | ICD-10-CM | POA: Insufficient documentation

## 2020-09-08 DIAGNOSIS — E039 Hypothyroidism, unspecified: Secondary | ICD-10-CM | POA: Diagnosis not present

## 2020-09-08 DIAGNOSIS — Z7984 Long term (current) use of oral hypoglycemic drugs: Secondary | ICD-10-CM | POA: Diagnosis not present

## 2020-09-08 DIAGNOSIS — Z79899 Other long term (current) drug therapy: Secondary | ICD-10-CM | POA: Insufficient documentation

## 2020-09-08 NOTE — Progress Notes (Signed)
Radiation Oncology         [phone] ________________________________  Initial outpatient Consultation  Name: [name] White MRN: [id number]  Date: 09/08/2020  DOB: 06-Apr-1941  UX:NATF, Edwinna Areola, MD  Celene Squibb, MD   REFERRING PHYSICIAN: Celene Squibb, MD  DIAGNOSIS:    ICD-10-CM   1. Benign neoplasm of pituitary gland (HCC)  D35.2 Ambulatory referral to Social Work    CHIEF COMPLAINT: Here to discuss management of pituitary tumor  HISTORY OF PRESENT ILLNESS::[name] White is a 79 y.o. female who presented with significant visual loss about 20 years ago and was found to have a pituitary tumor. Dr. Ellene Route resected this, and after surgery her vision improved substantially.  Her resection cavity was followed for several years and there was no evidence of recurrence.  Patient is not sure if she was eventually discharged from follow-ups or lost to follow-up.  Several months ago she notices similar visual loss to what she had experiences 20 years ago.  She did not initially mention this to family or providers. She then had a fall that she attributed to poor vision and tripping on a curb.  She underwent a  Head CT scan.  This demonstrated the presence of a large pituitary mass and on work-up it is evident that she has recurred her pituitary tumor and now invades the clivus significantly there is also a portion that extends into the suprasellar cistern and there is elevation of the optic structures.  Recent evaluation of her vision reveals that he has marked decreased in both eyes but is nearly blind in her right eye.  On 06/04/2020 she underwent resection: -Dr. Deatra Ina and Dr. Kristeen Miss Transnasal endoscopic resection of pituitary tumor with image guidance -Dr. Jerrell Belfast (ENT) 1. Endoscopic transsphenoidal revision pituitary resection with intraoperative navigation 2.  Bilateral endoscopic sphenoidotomy with removal of tissue.  Pathology showed: A. SINUS CONTENTS:  -  Fragments of benign sinonasal mucosa, bone and cartilage   B. PITUITARY TUMOR, RESECTION:  - Consistent with pituitary adenoma   Post op MRI Brain W/O Contrast 07/04/2020 IMPRESSION: 1. Status post debulking of large pituitary tumor. The greatest dimensions of the mass are unchanged, though there is a sizable defect of the anterior surface of the mass. 2. Unchanged bilateral cavernous sinus invasion and mass effect the optic chiasm. 3. Advanced chronic ischemic microangiopathy.  Dose of Decadron, if applicable: Not currently prescribed  Recent neurologic symptoms, if any:   Seizures: No  Headaches: No  Nausea: No  Dizziness/ataxia: Yes. When patient changes positions (sit to stand, stand to supine, bending over, etc), or if she tried to ambulate too quickly  Difficulty with hand coordination: No  Focal numbness/weakness: Minor weakness in legs and occasional lack of sensation in her feet  Visual deficits/changes: Yes, decrease in peripheral vision. More prominent in right eye than left (has tape of half of glasses on right eye to help).   Confusion/Memory deficits: Has trouble remembering some day to day things (like people's name), but no issues with long term memory  SAFETY ISSUES:  Prior radiation? No  Pacemaker/ICD? No  Possible current pregnancy? No--history hysterectomy  Is the patient on methotrexate? No  Additional Complaints / other details: Husband passed away August 30, 2020, so patient is still dealing with the loss and handling of personal affairs. Her change in vision has impacted her independence and she has to rely heavily on her children (2 daughters and 1 son) to help get to her to her  appointments and run errands.   She is scheduled for 3T-MRI on 09/09/20.  She is vaccinated for COVID, Moderna x 2, last given in March.   PREVIOUS RADIATION THERAPY: No  PAST MEDICAL HISTORY:  has a past medical history of Anxiety, Asthma, ASYMPTOMATIC POSTMENOPAUSAL  STATUS (02/09/2009), Cataract, Constipation (03/19/2013), Depression, DIABETES MELLITUS, TYPE II (09/14/2007), HYPERCHOLESTEROLEMIA (02/09/2009), HYPERTENSION (02/09/2009), HYPOTHYROIDISM (09/14/2007), MIGRAINE HEADACHE (09/14/2007), Neuropathy, OSTEOPOROSIS (02/09/2009), PANCREATITIS (09/14/2007), PITUITARY ADENOMA (09/14/2007), PONV (postoperative nausea and vomiting), Rectocele (03/19/2013), Shortness of breath, SUPERFICIAL PHLEBITIS (09/14/2007), and Varicose veins.    PAST SURGICAL HISTORY: Past Surgical History:  Procedure Laterality Date  . ABDOMINAL HYSTERECTOMY    . ANTERIOR AND POSTERIOR REPAIR N/A 04/02/2013   Procedure: ANTERIOR (CYSTOCELE) AND POSTERIOR REPAIR (RECTOCELE);  Surgeon: Jonnie Kind, MD;  Location: [facility];  Service: Gynecology;  Laterality: N/A;  . APPENDECTOMY    . BRAIN SURGERY    . CATARACT EXTRACTION W/PHACO  12/05/2011   Procedure: CATARACT EXTRACTION PHACO AND INTRAOCULAR LENS PLACEMENT (IOC);  Surgeon: Tonny Branch;  Location: [facility];  Service: Ophthalmology;  Laterality: Left;  CDE:9.65  . CHOLECYSTECTOMY    . CRANIOTOMY N/A 06/04/2020   Procedure: Endoscopic Transphenoidal Resection of Recurrent PituitaryTumor;  Surgeon: Kristeen Miss, MD;  Location: [place];  Service: Neurosurgery;  Laterality: N/A;  . CYSTOSCOPY    . ENDOVENOUS ABLATION SAPHENOUS VEIN W/ LASER  11-03-2011   right greater saphenous vein   left leg done 10-2011  . EYE SURGERY  98   right cataract extraction 98  . LAPAROSCOPIC NISSEN FUNDOPLICATION    . NM ESOPHAGEAL REFLUX  08-11-11  . PITUITARY EXCISION  10/1997  . POSTERIOR REPAIR    . TRANSNASAL APPROACH N/A 06/04/2020   Procedure: TRANSNASAL APPROACH;  Surgeon: Jerrell Belfast, MD;  Location: [place];  Service: ENT;  Laterality: N/A;  . TRANSPHENOIDAL / TRANSNASAL HYPOPHYSECTOMY / RESECTION PITUITARY TUMOR  08-11-11    FAMILY HISTORY: family history includes Asthma in her daughter and mother; COPD in her father; Heart disease in her father and  mother; Migraines in her daughter; Neuropathy in her son; Stroke in her father.  SOCIAL HISTORY:  reports that she has never smoked. She has never used smokeless tobacco. She reports that she does not drink alcohol and does not use drugs.  ALLERGIES: Betadine [povidone iodine] and Penicillins  MEDICATIONS:  Current Outpatient Medications  Medication Sig Dispense Refill  . ACCU-CHEK AVIVA PLUS test strip TEST TWICE DAILY. 100 strip 0  . albuterol (PROVENTIL HFA;VENTOLIN HFA) 108 (90 BASE) MCG/ACT inhaler Inhale 2 puffs into the lungs every 4 (four) hours as needed for wheezing or shortness of breath.     Marland Kitchen aspirin 81 MG tablet Take 81 mg by mouth daily.    Marland Kitchen atorvastatin (LIPITOR) 40 MG tablet Take 1 tablet (40 mg total) by mouth at bedtime. 90 tablet 3  . baclofen (LIORESAL) 10 MG tablet Take 10 mg by mouth daily.    Marland Kitchen BREO ELLIPTA 200-25 MCG/INH AEPB Inhale 1 puff into the lungs daily.     . carvedilol (COREG) 3.125 MG tablet Take 1 tablet (3.125 mg total) by mouth in the morning and at bedtime. 180 tablet 3  . cycloSPORINE (RESTASIS) 0.05 % ophthalmic emulsion Place 1 drop into both eyes 2 (two) times daily.      . dapagliflozin propanediol (FARXIGA) 5 MG TABS tablet Take 2.5 mg by mouth daily before breakfast. (Patient taking differently: Take 5 mg by mouth daily before breakfast. ) 15 tablet 11  .  docusate sodium (COLACE) 100 MG capsule Take 1 capsule (100 mg total) by mouth 2 (two) times daily. 10 capsule 0  . estradiol (ESTRACE) 1 MG tablet TAKE ONE TABLET (1MG  TOTAL) BY MOUTH DAILY (Patient taking differently: Take 1 mg by mouth daily. ) 90 tablet 1  . fluticasone (FLONASE) 50 MCG/ACT nasal spray Place 2 sprays into the nose at bedtime.    . furosemide (LASIX) 20 MG tablet May take Lasix 20 mg as NEEDED for leg swelling 45 tablet 3  . HYDROcodone-acetaminophen (NORCO/VICODIN) 5-325 MG tablet Take 1 tablet by mouth every 6 (six) hours as needed for moderate pain.     Marland Kitchen levothyroxine  (SYNTHROID) 50 MCG tablet TAKE ONE TABLET (50MCG TOTAL) BY MOUTH DAILY BEFORE BREAKFAST (Patient taking differently: Take 50 mcg by mouth daily before breakfast. ) 90 tablet 2  . lisinopril (ZESTRIL) 10 MG tablet Take 10 mg by mouth daily.     . metFORMIN (GLUCOPHAGE-XR) 500 MG 24 hr tablet TAKE TWO (2) TABLETS BY MOUTH DAILY (Patient taking differently: Take 1,000 mg by mouth daily with breakfast. ) 180 tablet 3  . Multiple Vitamins-Minerals (MULTIVITAMINS THER. W/MINERALS) TABS Take 1 tablet by mouth every morning.      Marland Kitchen omeprazole (PRILOSEC) 20 MG capsule Take 20 mg by mouth every morning.     . pregabalin (LYRICA) 150 MG capsule TAKE ONE CAPSULE (150 MG TOTAL) BY MOUTHTWO TIMES DAILY. 60 capsule 5  . repaglinide (PRANDIN) 1 MG tablet TAKE 1 TABLET BY MOUTH WITH BREAKFAST, 4 TABLETS WITH TABLETS WITH LUNCH, AND 4TABLETS WITH SUPPER (Patient taking differently: Take 1-4 mg by mouth See admin instructions. Take 1 mg by mouth in the morning with breakfast, 4 mg with lunch and 4 mg with dinner) 810 tablet 3  . rizatriptan (MAXALT-MLT) 10 MG disintegrating tablet Take 10 mg by mouth as needed for migraine. May repeat in 2 hours if needed    . traMADol (ULTRAM) 50 MG tablet Take 1 tablet (50 mg total) by mouth every 6 (six) hours as needed. 10 tablet 0  . venlafaxine (EFFEXOR-XR) 150 MG 24 hr capsule Take 150 mg by mouth every morning.     . Vibegron (GEMTESA) 75 MG TABS Take 75 mg by mouth at bedtime.    Marland Kitchen VICTOZA 18 MG/3ML SOPN INJECT 0.3MLS (1.8MG  TOTAL) INTO THE SKIN DAILY 27 mL 4  . methocarbamol (ROBAXIN) 500 MG tablet Take 1 tablet (500 mg total) by mouth 2 (two) times daily. (Patient not taking: Reported on 09/08/2020) 20 tablet 0   No current facility-administered medications for this encounter.    REVIEW OF SYSTEMS:  Notable for that above.   PHYSICAL EXAM:  height is 5\' 5"  (1.651 m) and weight is 155 lb 9.6 oz (70.6 kg). Her temperature is 97.8 F (36.6 C). Her blood pressure is 111/59  (abnormal) and her pulse is 70. Her respiration is 20 and oxygen saturation is 98%.    General: Alert and oriented, in no acute distress HEENT: Head is normocephalic. She has blurry/double vision.  Visual quadrants are all grossly intact to movement of fingers. Musculoskeletal: symmetric strength and muscle tone throughout - walks w/ cane Neurologic: Cranial nerves II through XII are grossly intact. No obvious focalities. Speech is fluent. Coordination is intact. Psychiatric: Judgment and insight are intact. Affect is appropriate.    KPS = 70 100 - Normal; no complaints; no evidence of disease. 90   - Able to carry on normal activity; minor signs or symptoms of  disease. 80   - Normal activity with effort; some signs or symptoms of disease. 23   - Cares for self; unable to carry on normal activity or to do active work. 60   - Requires occasional assistance, but is able to care for most of his personal needs. 50   - Requires considerable assistance and frequent medical care. 50   - Disabled; requires special care and assistance. 47   - Severely disabled; hospital admission is indicated although death not imminent. 17   - Very sick; hospital admission necessary; active supportive treatment necessary. 10   - Moribund; fatal processes progressing rapidly. 0     - Dead  Karnofsky DA, Abelmann WH, Craver LS and Skillman JH [phone]) The use of the nitrogen mustards in the palliative treatment of carcinoma: with particular reference to bronchogenic carcinoma Cancer 1 634-56  LABORATORY DATA:  Lab Results  Component Value Date   WBC 15.8 (H) 06/05/2020   HGB 12.5 06/05/2020   HCT 38.7 06/05/2020   MCV 93.5 06/05/2020   PLT 233 06/05/2020   CMP     Component Value Date/Time   NA 141 06/25/2020 1441   K 4.6 06/25/2020 1441   CL 105 06/25/2020 1441   CO2 29 06/25/2020 1441   GLUCOSE 118 (H) 06/25/2020 1441   BUN 10 06/25/2020 1441   CREATININE 0.76 06/25/2020 1441   CREATININE 0.72  10/03/2016 0706   CALCIUM 9.3 06/25/2020 1441   PROT 6.6 07/22/2019 0913   ALBUMIN 3.5 07/22/2019 0913   AST 19 07/22/2019 0913   ALT 22 07/22/2019 0913   ALKPHOS 27 (L) 07/22/2019 0913   BILITOT 1.2 07/22/2019 0913   GFRNONAA >60 06/05/2020 0606   GFRAA >60 06/05/2020 0606         RADIOGRAPHY:   As above     IMPRESSION/PLAN: This is a lovely 79 yo women with a recurrent pituitary adenoma.  I have personally reviewed her images and labwork; I've confirmed with Dr Loanne Drilling, her endocrinologist, that this is non-secreting.  Her surgeons do not recommend further resection.  Today, I talked to the patient about the findings and work-up thus far. We discussed the patient's diagnosis of recurrent bulky pituitary tumor and general treatment for this, highlighting the role of radiotherapy in the management. We discussed the available radiation techniques, and focused on the details of logistics and delivery.   I recommend a 6 week course of radiotherapy with the goal of stopping the adenoma's growth and providing long term local control.  She understands that her vision may never improve since this type of tumor does not "melt away" from radiotherapy; most likely, her vision will stabilize; the risk of injury to the optic nerves/chiasm from radiotherapy is small.   We discussed the risks, benefits, and side effects of radiotherapy. Side effects may include but not necessarily be limited to: fatigue, hair loss, Headaches. Pituitary dysfunction - continue followup with Dr Loanne Drilling.  Rare injury to the brain and /or nerves.  Possible cognitive slowing long term.  No guarantees of treatment were given. A consent form was signed and placed in the patient's medical record. The patient was encouraged to ask questions that I answered to the best of my ability.   MRI (3T) pending this week, with CT simulation to follow.    We discussed measures to reduce the risk of infection during the COVID-19  pandemic.  She has been vaccinated.  On date of service, in total, I spent 60 minutes on  this encounter. Patient was seen in person.  __________________________________________   Eppie Gibson, MD

## 2020-09-09 ENCOUNTER — Encounter: Payer: Self-pay | Admitting: *Deleted

## 2020-09-09 ENCOUNTER — Encounter: Payer: Self-pay | Admitting: Radiation Oncology

## 2020-09-09 ENCOUNTER — Ambulatory Visit
Admission: RE | Admit: 2020-09-09 | Discharge: 2020-09-09 | Disposition: A | Discharge status: Home / Self Care | Payer: Medicare Other | Source: Ambulatory Visit | Attending: Radiation Oncology | Admitting: Radiation Oncology

## 2020-09-09 DIAGNOSIS — R220 Localized swelling, mass and lump, head: Secondary | ICD-10-CM | POA: Diagnosis not present

## 2020-09-09 DIAGNOSIS — G9389 Other specified disorders of brain: Secondary | ICD-10-CM | POA: Diagnosis not present

## 2020-09-09 DIAGNOSIS — D352 Benign neoplasm of pituitary gland: Secondary | ICD-10-CM

## 2020-09-09 MED ORDER — GADOBENATE DIMEGLUMINE 529 MG/ML IV SOLN
10.0000 mL | Freq: Once | INTRAVENOUS | Status: AC | PRN
  Administered 2020-09-09: 10 mL via INTRAVENOUS

## 2020-09-09 NOTE — Progress Notes (Signed)
[name] Psychosocial Distress Screening Clinical Social Work  Clinical Social Work was referred by distress screening protocol.  The patient scored a 6 on the Psychosocial Distress Thermometer which indicates moderate distress. Clinical Social Worker contacted patient by phone to assess for distress and other psychosocial needs. [name] White shared she was expecting a call from SW regarding transportation concerns, but does not feel she needs assistance at this time.  Patient indicated her son can bring her to most appointments, as he's been staying with his mother during the week.  She shared she has several family members serving as her support system including her son, two daughters, and her sister. CSW explored patient's emotional response to health changes and the recent loss of her spouse.  Patient shared " she's doing okay".  CSW encouraged patient to follow up with CSW team for counseling or general support.  Patient had no questions regarding treatment and felt the process was adequately explained by the radiation oncologist.  [facility] DISTRESS SCREENING 09/08/2020  Distress experienced in past week (1-10) 6  Practical problem type Transportation  Emotional problem type Nervousness/Anxiety;Adjusting to illness  Information Concerns Type Lack of info about treatment  Physician notified of physical symptoms Yes  Referral to clinical psychology No  Referral to clinical social work Yes  Referral to dietition No  Referral to financial advocate Yes  Referral to support programs Yes  Referral to palliative care No    Clinical Social Worker follow up needed: No.  If yes, follow up plan:  Gwinda Maine, LCSW

## 2020-09-11 ENCOUNTER — Ambulatory Visit
Admission: RE | Admit: 2020-09-11 | Discharge: 2020-09-11 | Disposition: A | Discharge status: Home / Self Care | Payer: Medicare Other | Source: Ambulatory Visit | Attending: Radiation Oncology | Admitting: Radiation Oncology

## 2020-09-11 ENCOUNTER — Other Ambulatory Visit: Payer: Self-pay

## 2020-09-11 DIAGNOSIS — Z510 Encounter for antineoplastic radiation therapy: Secondary | ICD-10-CM | POA: Diagnosis not present

## 2020-09-11 DIAGNOSIS — D352 Benign neoplasm of pituitary gland: Secondary | ICD-10-CM | POA: Diagnosis not present

## 2020-09-16 DIAGNOSIS — D352 Benign neoplasm of pituitary gland: Secondary | ICD-10-CM | POA: Diagnosis not present

## 2020-09-16 DIAGNOSIS — Z510 Encounter for antineoplastic radiation therapy: Secondary | ICD-10-CM | POA: Diagnosis not present

## 2020-09-21 ENCOUNTER — Other Ambulatory Visit: Payer: Self-pay

## 2020-09-21 ENCOUNTER — Ambulatory Visit
Admission: RE | Admit: 2020-09-21 | Discharge: 2020-09-21 | Disposition: A | Discharge status: Home / Self Care | Payer: Medicare Other | Source: Ambulatory Visit | Attending: Radiation Oncology | Admitting: Radiation Oncology

## 2020-09-21 DIAGNOSIS — D353 Benign neoplasm of craniopharyngeal duct: Secondary | ICD-10-CM

## 2020-09-21 DIAGNOSIS — D352 Benign neoplasm of pituitary gland: Secondary | ICD-10-CM | POA: Diagnosis not present

## 2020-09-21 DIAGNOSIS — Z510 Encounter for antineoplastic radiation therapy: Secondary | ICD-10-CM | POA: Diagnosis not present

## 2020-09-21 MED ORDER — SONAFINE EX EMUL
1.0000 "application " | Freq: Two times a day (BID) | CUTANEOUS | Status: DC
  Administered 2020-09-21: 1 via TOPICAL

## 2020-09-21 NOTE — Progress Notes (Signed)
Pt here for patient teaching.    Pt given Radiation and You booklet, skin care instructions and Sonafine.    Reviewed areas of pertinence such as fatigue, hair loss, nausea and vomiting, skin changes, headache and blurry vision .   Pt able to give teach back of to pat skin, use unscented/gentle soap and drink plenty of water,apply Sonafine bid and avoid applying anything to skin within 4 hours of treatment.   Pt demonstrated understanding and verbalizes understanding of information given and will contact nursing with any questions or concerns.    [email or web address]/treatmentinformation/whattoexpect/index          

## 2020-09-22 ENCOUNTER — Ambulatory Visit
Admission: RE | Admit: 2020-09-22 | Discharge: 2020-09-22 | Disposition: A | Discharge status: Home / Self Care | Payer: Medicare Other | Source: Ambulatory Visit | Attending: Radiation Oncology | Admitting: Radiation Oncology

## 2020-09-22 ENCOUNTER — Other Ambulatory Visit: Payer: Self-pay

## 2020-09-22 DIAGNOSIS — D352 Benign neoplasm of pituitary gland: Secondary | ICD-10-CM | POA: Diagnosis not present

## 2020-09-22 DIAGNOSIS — Z510 Encounter for antineoplastic radiation therapy: Secondary | ICD-10-CM | POA: Diagnosis not present

## 2020-09-23 ENCOUNTER — Ambulatory Visit
Admission: RE | Admit: 2020-09-23 | Discharge: 2020-09-23 | Disposition: A | Discharge status: Home / Self Care | Payer: Medicare Other | Source: Ambulatory Visit | Attending: Radiation Oncology | Admitting: Radiation Oncology

## 2020-09-23 ENCOUNTER — Other Ambulatory Visit: Payer: Self-pay

## 2020-09-23 DIAGNOSIS — D352 Benign neoplasm of pituitary gland: Secondary | ICD-10-CM | POA: Diagnosis not present

## 2020-09-23 DIAGNOSIS — Z510 Encounter for antineoplastic radiation therapy: Secondary | ICD-10-CM | POA: Diagnosis not present

## 2020-09-24 ENCOUNTER — Ambulatory Visit
Admission: RE | Admit: 2020-09-24 | Discharge: 2020-09-24 | Disposition: A | Discharge status: Home / Self Care | Payer: Medicare Other | Source: Ambulatory Visit | Attending: Radiation Oncology | Admitting: Radiation Oncology

## 2020-09-24 DIAGNOSIS — Z510 Encounter for antineoplastic radiation therapy: Secondary | ICD-10-CM | POA: Diagnosis not present

## 2020-09-24 DIAGNOSIS — D352 Benign neoplasm of pituitary gland: Secondary | ICD-10-CM | POA: Diagnosis not present

## 2020-09-25 ENCOUNTER — Ambulatory Visit
Admission: RE | Admit: 2020-09-25 | Discharge: 2020-09-25 | Disposition: A | Discharge status: Home / Self Care | Payer: Medicare Other | Source: Ambulatory Visit | Attending: Radiation Oncology | Admitting: Radiation Oncology

## 2020-09-25 ENCOUNTER — Other Ambulatory Visit: Payer: Self-pay

## 2020-09-25 DIAGNOSIS — D352 Benign neoplasm of pituitary gland: Secondary | ICD-10-CM | POA: Diagnosis not present

## 2020-09-25 DIAGNOSIS — Z510 Encounter for antineoplastic radiation therapy: Secondary | ICD-10-CM | POA: Diagnosis not present

## 2020-09-28 ENCOUNTER — Ambulatory Visit
Admission: RE | Admit: 2020-09-28 | Discharge: 2020-09-28 | Disposition: A | Discharge status: Home / Self Care | Payer: Medicare Other | Source: Ambulatory Visit | Attending: Radiation Oncology | Admitting: Radiation Oncology

## 2020-09-28 ENCOUNTER — Other Ambulatory Visit: Payer: Self-pay

## 2020-09-28 DIAGNOSIS — D352 Benign neoplasm of pituitary gland: Secondary | ICD-10-CM | POA: Diagnosis not present

## 2020-09-28 DIAGNOSIS — Z510 Encounter for antineoplastic radiation therapy: Secondary | ICD-10-CM | POA: Diagnosis not present

## 2020-09-29 ENCOUNTER — Ambulatory Visit
Admission: RE | Admit: 2020-09-29 | Discharge: 2020-09-29 | Disposition: A | Discharge status: Home / Self Care | Payer: Medicare Other | Source: Ambulatory Visit | Attending: Radiation Oncology | Admitting: Radiation Oncology

## 2020-09-29 DIAGNOSIS — D352 Benign neoplasm of pituitary gland: Secondary | ICD-10-CM | POA: Diagnosis not present

## 2020-09-29 DIAGNOSIS — Z510 Encounter for antineoplastic radiation therapy: Secondary | ICD-10-CM | POA: Diagnosis not present

## 2020-09-30 ENCOUNTER — Ambulatory Visit
Admission: RE | Admit: 2020-09-30 | Discharge: 2020-09-30 | Disposition: A | Discharge status: Home / Self Care | Payer: Medicare Other | Source: Ambulatory Visit | Attending: Radiation Oncology | Admitting: Radiation Oncology

## 2020-09-30 DIAGNOSIS — Z510 Encounter for antineoplastic radiation therapy: Secondary | ICD-10-CM | POA: Diagnosis not present

## 2020-09-30 DIAGNOSIS — D352 Benign neoplasm of pituitary gland: Secondary | ICD-10-CM | POA: Diagnosis not present

## 2020-10-01 ENCOUNTER — Ambulatory Visit
Admission: RE | Admit: 2020-10-01 | Discharge: 2020-10-01 | Disposition: A | Discharge status: Home / Self Care | Payer: Medicare Other | Source: Ambulatory Visit | Attending: Radiation Oncology | Admitting: Radiation Oncology

## 2020-10-01 DIAGNOSIS — Z510 Encounter for antineoplastic radiation therapy: Secondary | ICD-10-CM | POA: Diagnosis not present

## 2020-10-01 DIAGNOSIS — D352 Benign neoplasm of pituitary gland: Secondary | ICD-10-CM | POA: Diagnosis not present

## 2020-10-02 ENCOUNTER — Ambulatory Visit
Admission: RE | Admit: 2020-10-02 | Discharge: 2020-10-02 | Disposition: A | Discharge status: Home / Self Care | Payer: Medicare Other | Source: Ambulatory Visit | Attending: Radiation Oncology | Admitting: Radiation Oncology

## 2020-10-02 DIAGNOSIS — Z510 Encounter for antineoplastic radiation therapy: Secondary | ICD-10-CM | POA: Diagnosis not present

## 2020-10-02 DIAGNOSIS — D352 Benign neoplasm of pituitary gland: Secondary | ICD-10-CM | POA: Diagnosis not present

## 2020-10-05 ENCOUNTER — Ambulatory Visit
Admission: RE | Admit: 2020-10-05 | Discharge: 2020-10-05 | Disposition: A | Discharge status: Home / Self Care | Payer: Medicare Other | Source: Ambulatory Visit | Attending: Radiation Oncology | Admitting: Radiation Oncology

## 2020-10-05 DIAGNOSIS — Z510 Encounter for antineoplastic radiation therapy: Secondary | ICD-10-CM | POA: Diagnosis not present

## 2020-10-05 DIAGNOSIS — D352 Benign neoplasm of pituitary gland: Secondary | ICD-10-CM | POA: Diagnosis not present

## 2020-10-06 ENCOUNTER — Ambulatory Visit
Admission: RE | Admit: 2020-10-06 | Discharge: 2020-10-06 | Disposition: A | Discharge status: Home / Self Care | Payer: Medicare Other | Source: Ambulatory Visit | Attending: Radiation Oncology | Admitting: Radiation Oncology

## 2020-10-06 DIAGNOSIS — D352 Benign neoplasm of pituitary gland: Secondary | ICD-10-CM | POA: Diagnosis not present

## 2020-10-06 DIAGNOSIS — Z510 Encounter for antineoplastic radiation therapy: Secondary | ICD-10-CM | POA: Diagnosis not present

## 2020-10-07 ENCOUNTER — Ambulatory Visit
Admission: RE | Admit: 2020-10-07 | Discharge: 2020-10-07 | Disposition: A | Discharge status: Home / Self Care | Payer: Medicare Other | Source: Ambulatory Visit | Attending: Radiation Oncology | Admitting: Radiation Oncology

## 2020-10-07 DIAGNOSIS — D352 Benign neoplasm of pituitary gland: Secondary | ICD-10-CM | POA: Diagnosis not present

## 2020-10-07 DIAGNOSIS — Z510 Encounter for antineoplastic radiation therapy: Secondary | ICD-10-CM | POA: Diagnosis not present

## 2020-10-08 ENCOUNTER — Ambulatory Visit
Admission: RE | Admit: 2020-10-08 | Discharge: 2020-10-08 | Disposition: A | Discharge status: Home / Self Care | Payer: Medicare Other | Source: Ambulatory Visit | Attending: Radiation Oncology | Admitting: Radiation Oncology

## 2020-10-08 DIAGNOSIS — D352 Benign neoplasm of pituitary gland: Secondary | ICD-10-CM | POA: Diagnosis not present

## 2020-10-08 DIAGNOSIS — Z510 Encounter for antineoplastic radiation therapy: Secondary | ICD-10-CM | POA: Diagnosis not present

## 2020-10-09 ENCOUNTER — Ambulatory Visit
Admission: RE | Admit: 2020-10-09 | Discharge: 2020-10-09 | Disposition: A | Discharge status: Home / Self Care | Payer: Medicare Other | Source: Ambulatory Visit | Attending: Radiation Oncology | Admitting: Radiation Oncology

## 2020-10-09 DIAGNOSIS — Z510 Encounter for antineoplastic radiation therapy: Secondary | ICD-10-CM | POA: Diagnosis not present

## 2020-10-09 DIAGNOSIS — D352 Benign neoplasm of pituitary gland: Secondary | ICD-10-CM | POA: Diagnosis not present

## 2020-10-12 ENCOUNTER — Ambulatory Visit
Admission: RE | Admit: 2020-10-12 | Discharge: 2020-10-12 | Disposition: A | Discharge status: Home / Self Care | Payer: Medicare Other | Source: Ambulatory Visit | Attending: Radiation Oncology | Admitting: Radiation Oncology

## 2020-10-12 DIAGNOSIS — D352 Benign neoplasm of pituitary gland: Secondary | ICD-10-CM | POA: Diagnosis not present

## 2020-10-12 DIAGNOSIS — Z510 Encounter for antineoplastic radiation therapy: Secondary | ICD-10-CM | POA: Diagnosis not present

## 2020-10-13 ENCOUNTER — Ambulatory Visit
Admission: RE | Admit: 2020-10-13 | Discharge: 2020-10-13 | Disposition: A | Discharge status: Home / Self Care | Payer: Medicare Other | Source: Ambulatory Visit | Attending: Radiation Oncology | Admitting: Radiation Oncology

## 2020-10-13 DIAGNOSIS — D352 Benign neoplasm of pituitary gland: Secondary | ICD-10-CM | POA: Diagnosis not present

## 2020-10-13 DIAGNOSIS — Z510 Encounter for antineoplastic radiation therapy: Secondary | ICD-10-CM | POA: Diagnosis not present

## 2020-10-14 ENCOUNTER — Ambulatory Visit
Admission: RE | Admit: 2020-10-14 | Discharge: 2020-10-14 | Disposition: A | Discharge status: Home / Self Care | Payer: Medicare Other | Source: Ambulatory Visit | Attending: Radiation Oncology | Admitting: Radiation Oncology

## 2020-10-14 DIAGNOSIS — D352 Benign neoplasm of pituitary gland: Secondary | ICD-10-CM | POA: Diagnosis not present

## 2020-10-14 DIAGNOSIS — Z510 Encounter for antineoplastic radiation therapy: Secondary | ICD-10-CM | POA: Diagnosis not present

## 2020-10-15 ENCOUNTER — Other Ambulatory Visit: Payer: Self-pay

## 2020-10-15 ENCOUNTER — Ambulatory Visit
Admission: RE | Admit: 2020-10-15 | Discharge: 2020-10-15 | Disposition: A | Discharge status: Home / Self Care | Payer: Medicare Other | Source: Ambulatory Visit | Attending: Radiation Oncology | Admitting: Radiation Oncology

## 2020-10-15 DIAGNOSIS — D352 Benign neoplasm of pituitary gland: Secondary | ICD-10-CM | POA: Diagnosis not present

## 2020-10-15 DIAGNOSIS — Z510 Encounter for antineoplastic radiation therapy: Secondary | ICD-10-CM | POA: Diagnosis not present

## 2020-10-16 ENCOUNTER — Ambulatory Visit
Admission: RE | Admit: 2020-10-16 | Discharge: 2020-10-16 | Disposition: A | Discharge status: Home / Self Care | Payer: Medicare Other | Source: Ambulatory Visit | Attending: Radiation Oncology | Admitting: Radiation Oncology

## 2020-10-16 DIAGNOSIS — Z510 Encounter for antineoplastic radiation therapy: Secondary | ICD-10-CM | POA: Diagnosis not present

## 2020-10-16 DIAGNOSIS — D352 Benign neoplasm of pituitary gland: Secondary | ICD-10-CM | POA: Diagnosis not present

## 2020-10-19 ENCOUNTER — Ambulatory Visit
Admission: RE | Admit: 2020-10-19 | Discharge: 2020-10-19 | Disposition: A | Discharge status: Home / Self Care | Payer: Medicare Other | Source: Ambulatory Visit | Attending: Radiation Oncology | Admitting: Radiation Oncology

## 2020-10-19 DIAGNOSIS — D352 Benign neoplasm of pituitary gland: Secondary | ICD-10-CM | POA: Diagnosis not present

## 2020-10-19 DIAGNOSIS — Z510 Encounter for antineoplastic radiation therapy: Secondary | ICD-10-CM | POA: Diagnosis not present

## 2020-10-20 ENCOUNTER — Ambulatory Visit
Admission: RE | Admit: 2020-10-20 | Discharge: 2020-10-20 | Disposition: A | Discharge status: Home / Self Care | Payer: Medicare Other | Source: Ambulatory Visit | Attending: Radiation Oncology | Admitting: Radiation Oncology

## 2020-10-20 DIAGNOSIS — D352 Benign neoplasm of pituitary gland: Secondary | ICD-10-CM | POA: Diagnosis not present

## 2020-10-20 DIAGNOSIS — Z510 Encounter for antineoplastic radiation therapy: Secondary | ICD-10-CM | POA: Diagnosis not present

## 2020-10-21 ENCOUNTER — Ambulatory Visit
Admission: RE | Admit: 2020-10-21 | Discharge: 2020-10-21 | Disposition: A | Discharge status: Home / Self Care | Payer: Medicare Other | Source: Ambulatory Visit | Attending: Radiation Oncology | Admitting: Radiation Oncology

## 2020-10-21 DIAGNOSIS — M7989 Other specified soft tissue disorders: Secondary | ICD-10-CM

## 2020-10-21 DIAGNOSIS — D352 Benign neoplasm of pituitary gland: Secondary | ICD-10-CM | POA: Diagnosis not present

## 2020-10-21 DIAGNOSIS — Z510 Encounter for antineoplastic radiation therapy: Secondary | ICD-10-CM | POA: Diagnosis not present

## 2020-10-21 IMAGING — CT CT HEAD W/O CM
3 series · 16 of 47 positions shown, 19 images · non-contrast
Comparison: None

CLINICAL DATA: 78-year-old female with head trauma.

EXAM:
CT HEAD WITHOUT CONTRAST
TECHNIQUE: Contiguous axial images were obtained from the base of the skull
through the vertex without intravenous contrast.

[Series 2: head w o · axial · 0.50mm/px · z∈[+171,+306]mm · 10 of 33 slices shown, 13 images]
[im 3/33  brain]
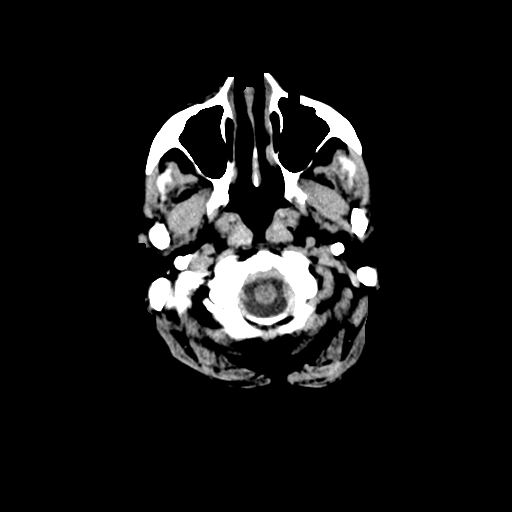
[im 3/33  bone]
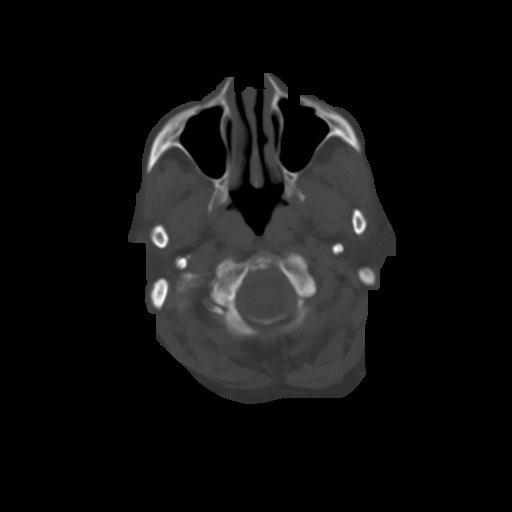
[im 6/33  brain]
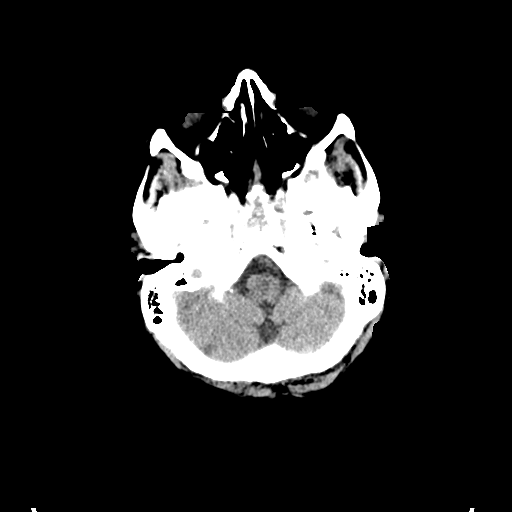
[im 9/33  brain]
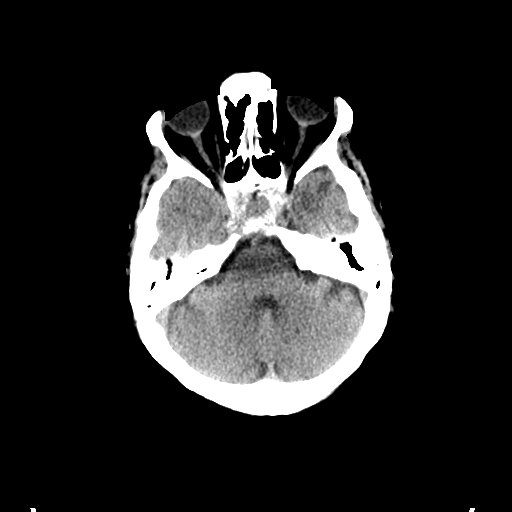
[im 12/33  brain]
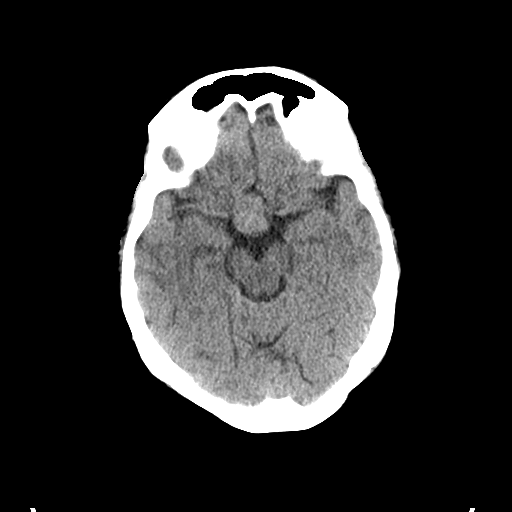
[im 15/33  brain]
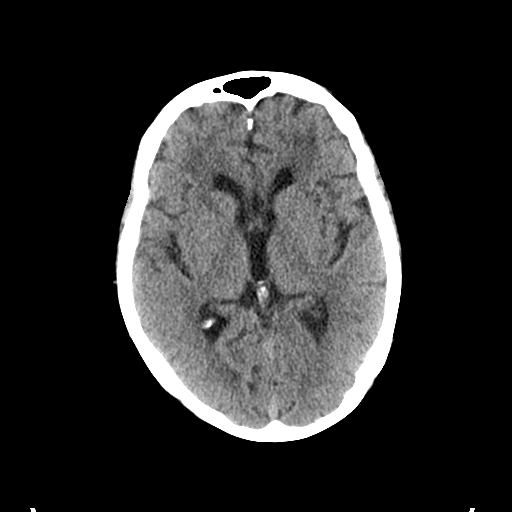
[im 15/33  bone]
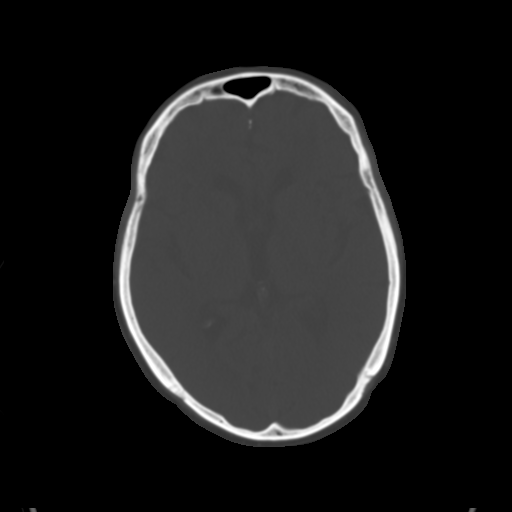
[im 18/33  brain]
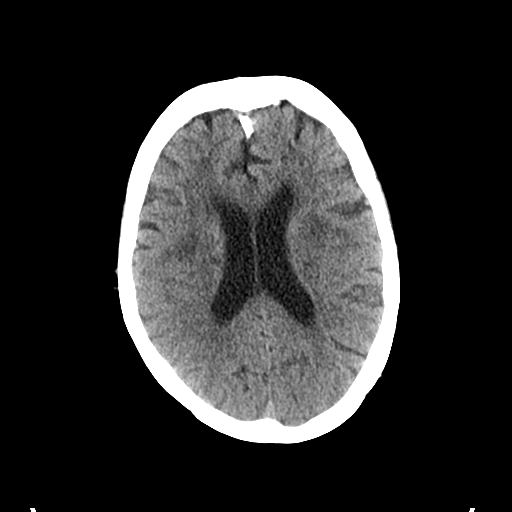
[im 21/33  brain]
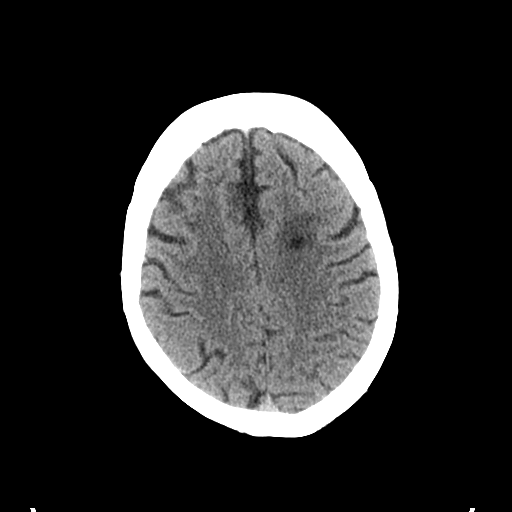
[im 25/33  brain]
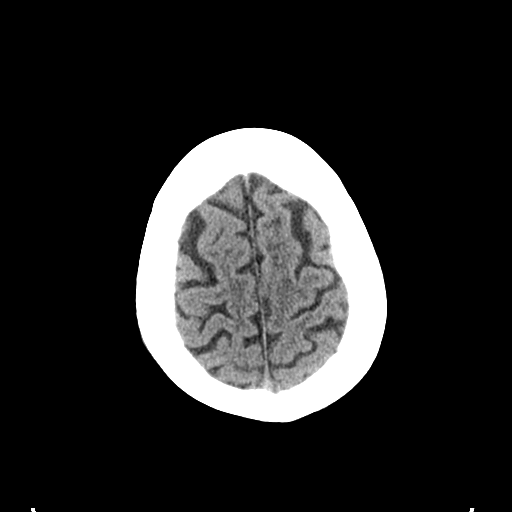
[im 27/33  brain]
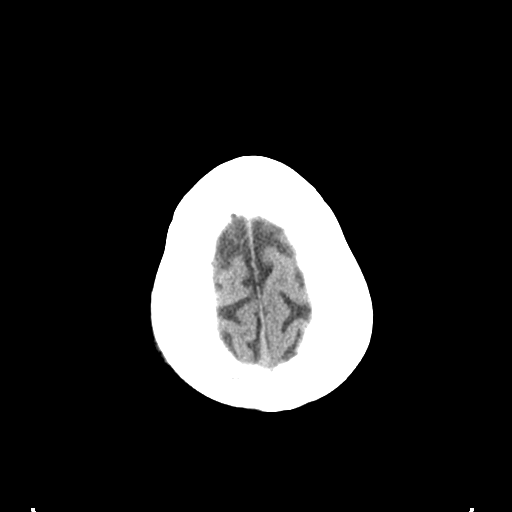
[im 27/33  bone]
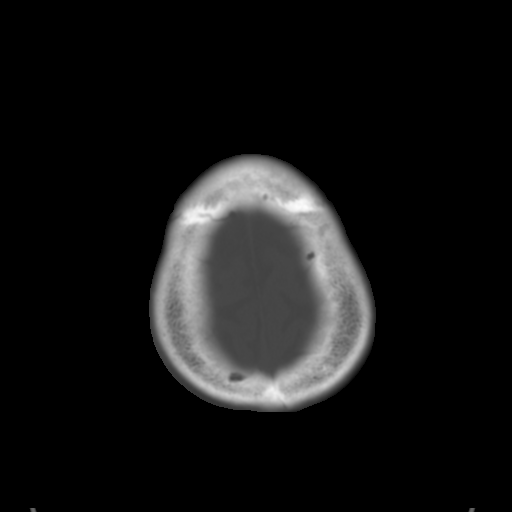
[im 30/33  brain]
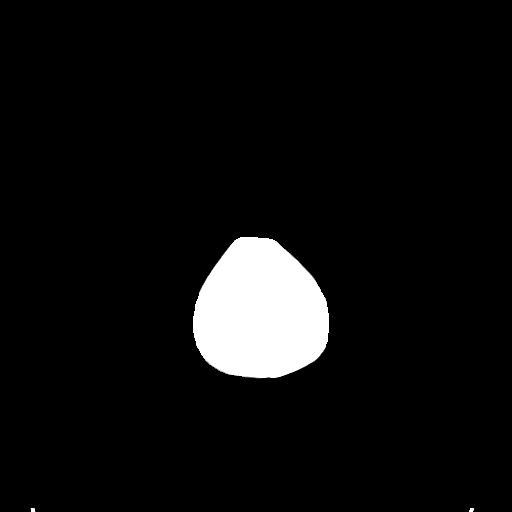

[Series 4: coronal soft · coronal · 0.35mm/px · 3 of 68 slices shown]
[im 23/68  brain]
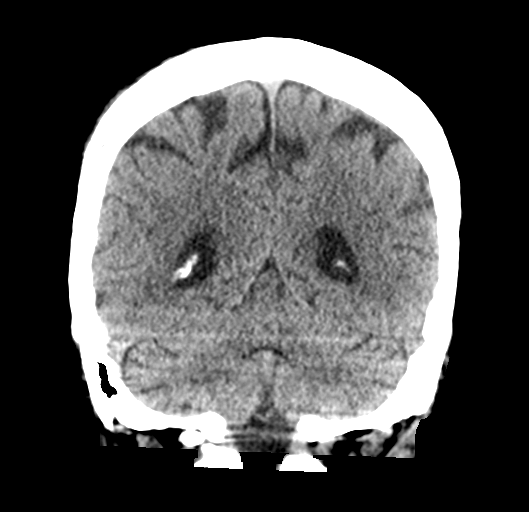
[im 30/68  brain]
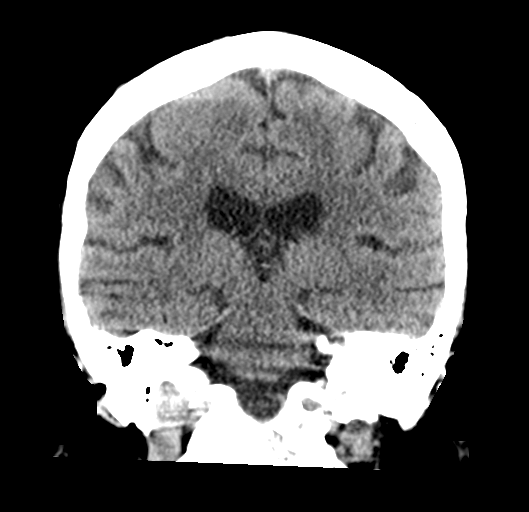
[im 38/68  brain]
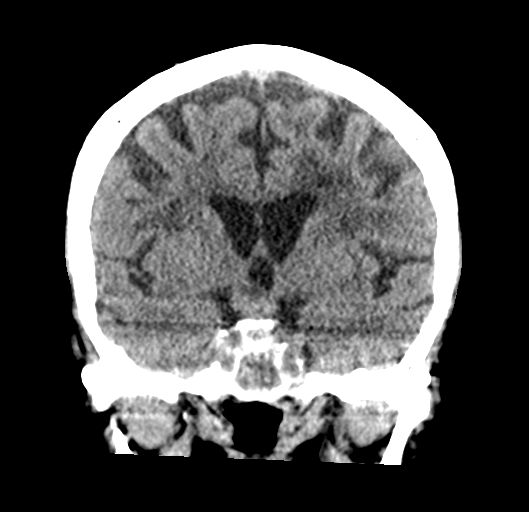

[Series 5: sagittal soft · sagittal · 0.36mm/px · 3 of 54 slices shown]
[im 18/54  brain]
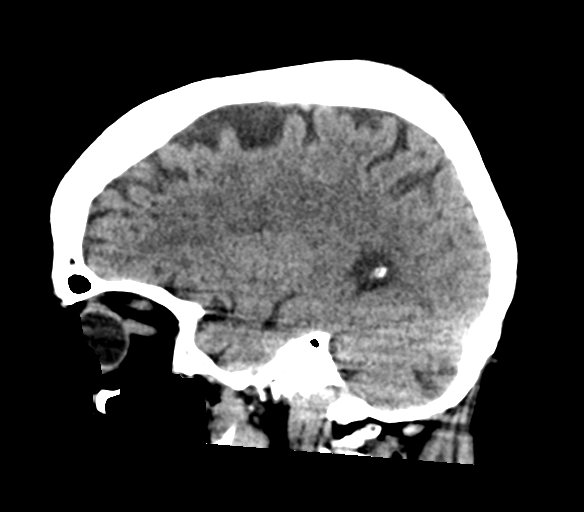
[im 27/54  brain]
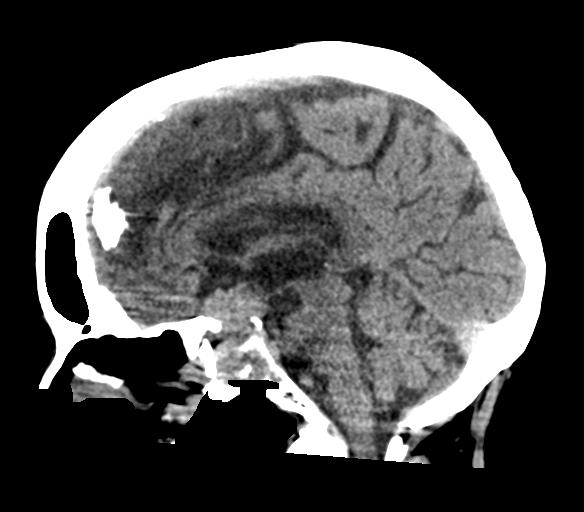
[im 36/54  brain]
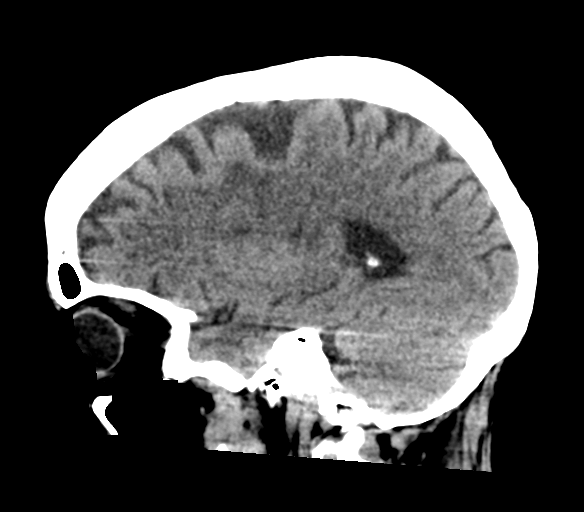

[16 of 47 positions shown; findings below may reference images not displayed]

FINDINGS: Brain: There is mild age-related atrophy and moderate chronic
microvascular ischemic changes. There is a 2.0 x 2.0 cm suprasellar
mass with expansion of the sella. Further characterization with MRI
without and with contrast is recommended. There is no acute
intracranial hemorrhage. No mass effect or midline shift. No
extra-axial fluid collection.

Vascular: No hyperdense vessel or unexpected calcification.

Skull: There is infiltrative changes of the base of the skull
involving the colitis. There is a fracture of the anterior clivus
posterior to the ethmoid air cells. Findings consistent with
infiltration of the mass.

Sinuses/Orbits: The visualized paranasal sinuses and mastoid air
cells are clear.

Other: None
IMPRESSION: 1. No acute intracranial hemorrhage.
2. Age-related atrophy and chronic microvascular ischemic changes.
3. Sellar/suprasellar mass with infiltration of the clivus. Findings
may represent a mass arising from the pituitary gland or other
lesions such as chondrosarcoma, chordoma, or lymphoma. Further
characterization with MRI without and with contrast is recommended.

## 2020-10-21 IMAGING — CT CT ABD-PELV W/O CM
2 of 4 series · 15 of 46 positions shown, 17 images · non-contrast
Comparison: Pelvis and lumbar radiographs earlier this day.

CLINICAL DATA: Abdominal trauma. Gross hematuria. Clinical concern
for occult lumbar pelvic fracture after fall 2 days ago.

EXAM:
CT ABDOMEN AND PELVIS WITHOUT CONTRAST
TECHNIQUE: Multidetector CT imaging of the abdomen and pelvis was performed
following the standard protocol without IV contrast.

[Series 2: axial st · axial · 0.80mm/px · z∈[-536,-101]mm · 12 of 101 slices shown, 14 images]
[im 7/101  soft-tissue]
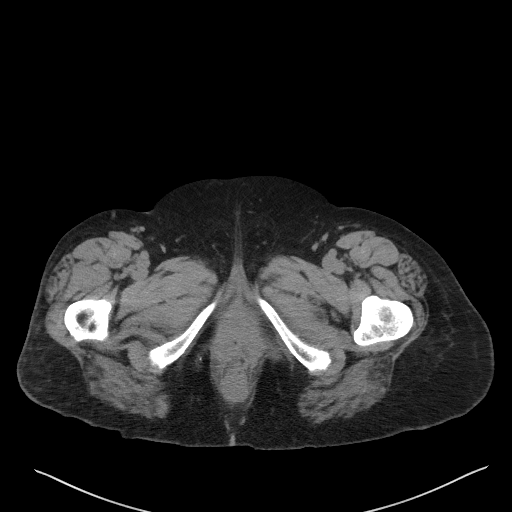
[im 7/101  bone]
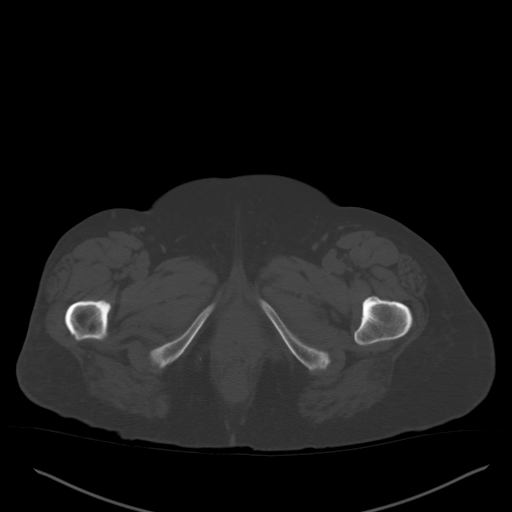
[im 14/101  soft-tissue]
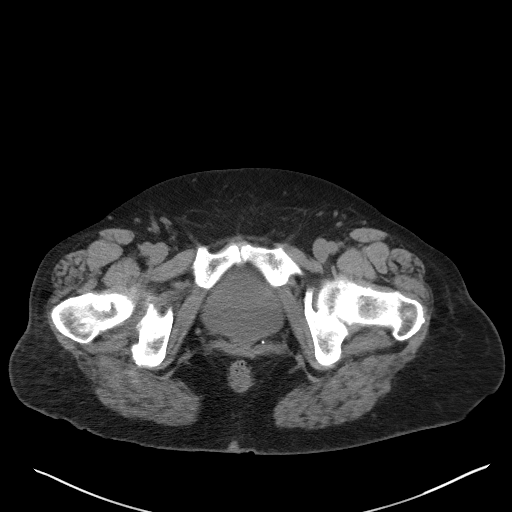
[im 21/101  soft-tissue]
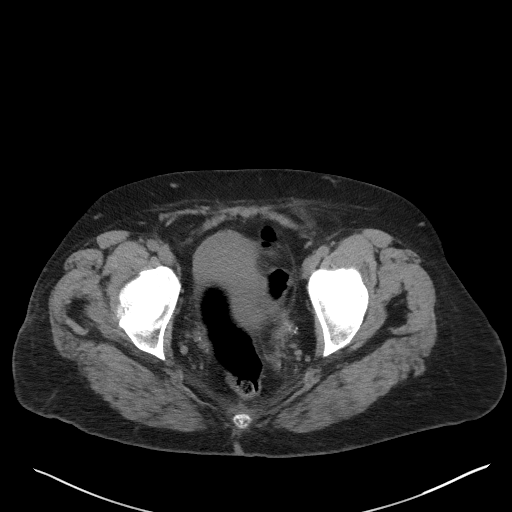
[im 34/101  soft-tissue]
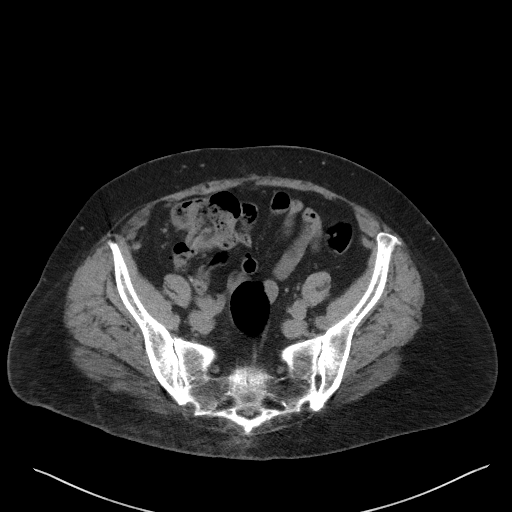
[im 41/101  soft-tissue]
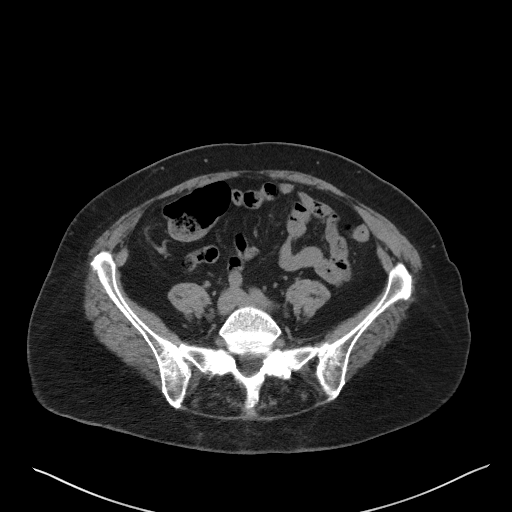
[im 47/101  soft-tissue]
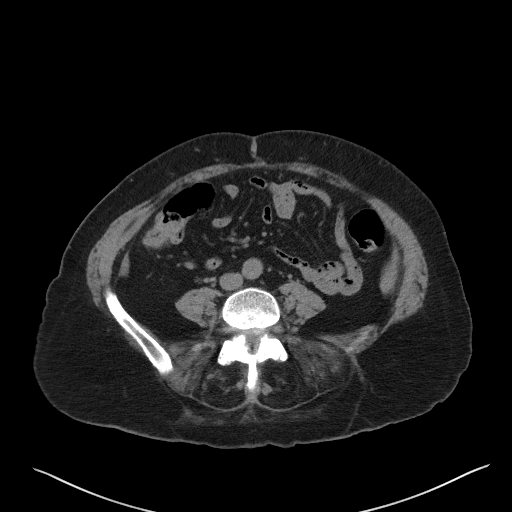
[im 54/101  soft-tissue]
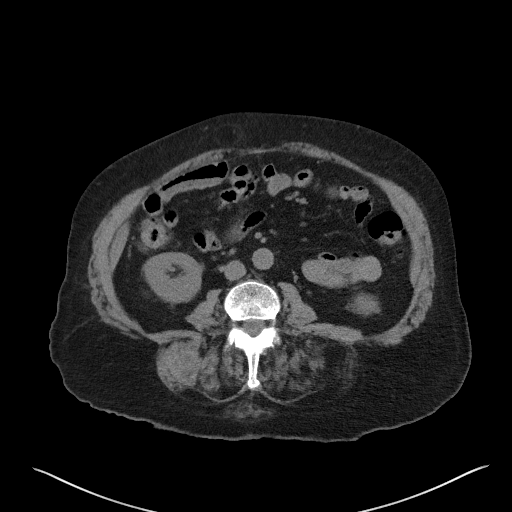
[im 61/101  soft-tissue]
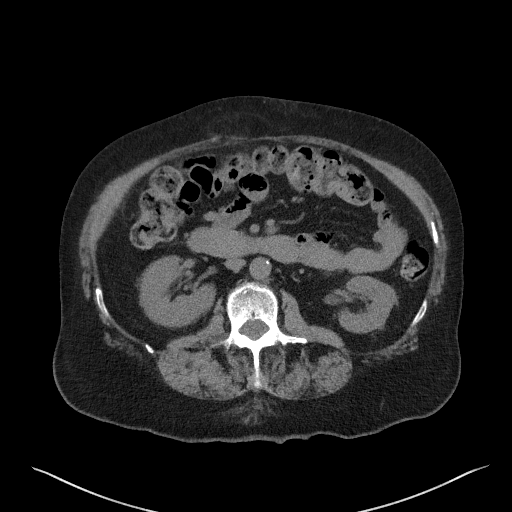
[im 67/101  soft-tissue]
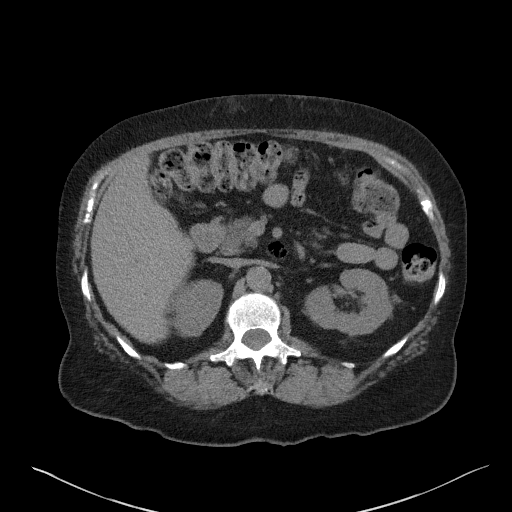
[im 67/101  bone]
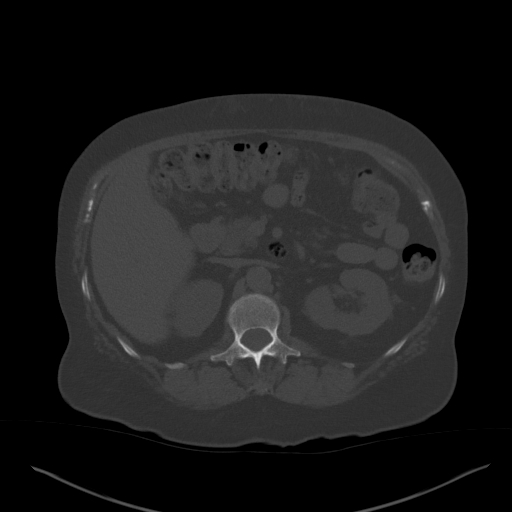
[im 81/101  soft-tissue]
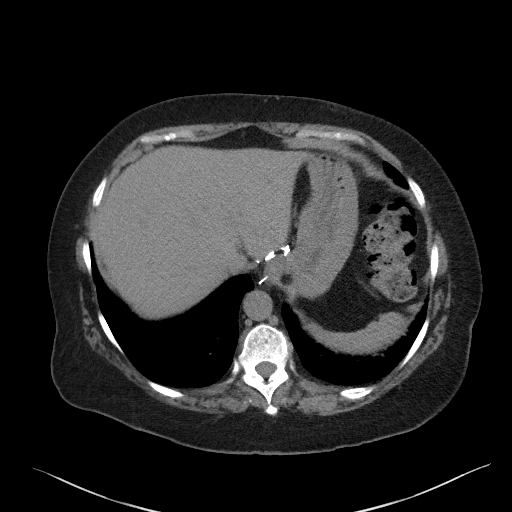
[im 87/101  soft-tissue]
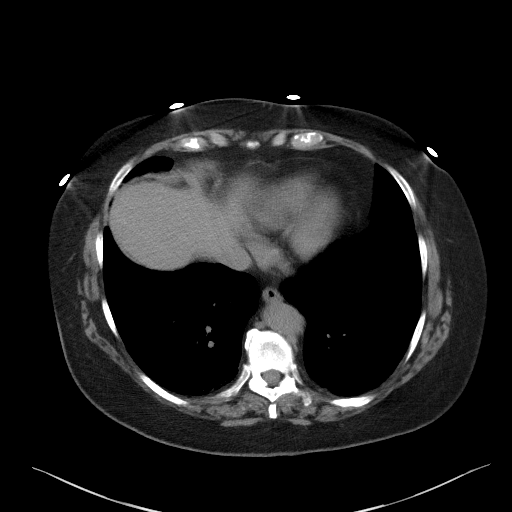
[im 94/101  soft-tissue]
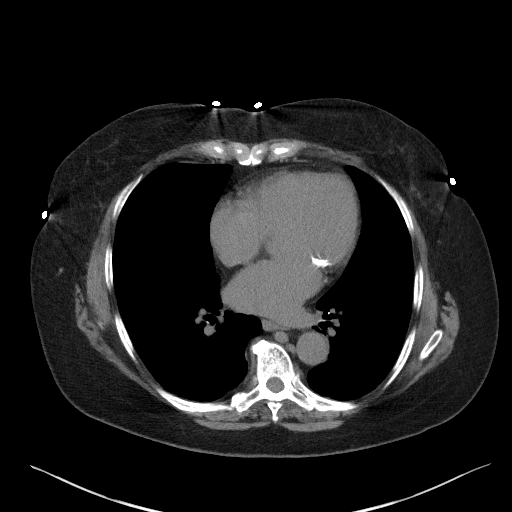

[Series 5: coronal st · coronal · 0.80mm/px · 3 of 105 slices shown]
[im 35/105  soft-tissue]
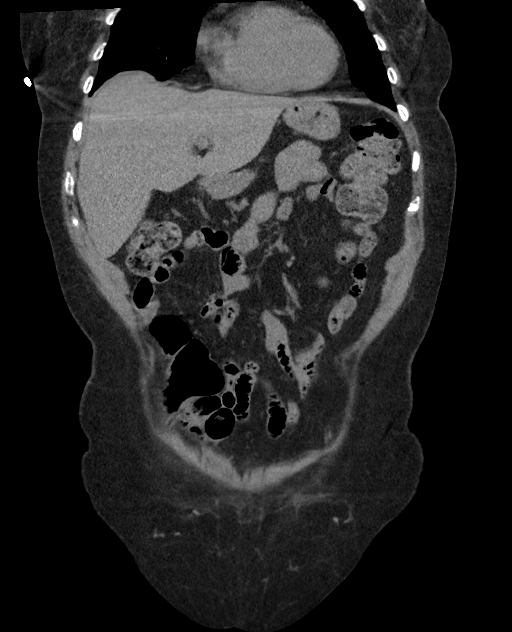
[im 47/105  soft-tissue]
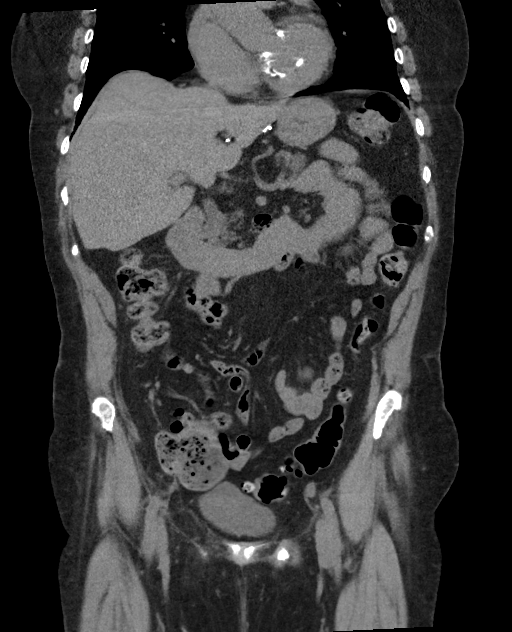
[im 58/105  soft-tissue]
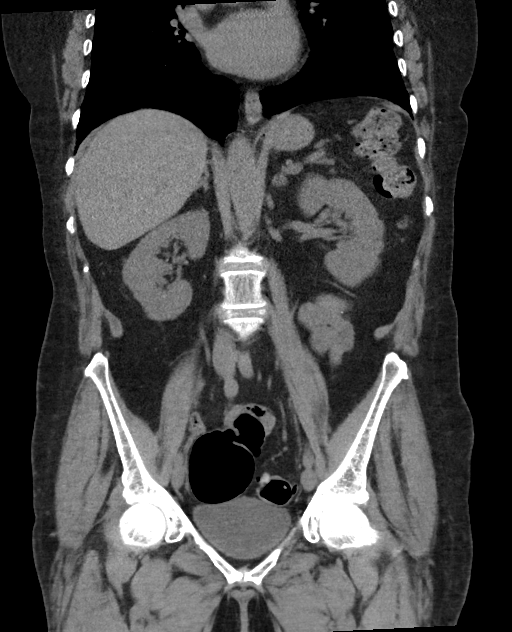

[15 of 46 positions shown; findings below may reference images not displayed]

FINDINGS: Lower chest: Mitral annulus calcifications. Normal heart size. No
basilar pneumothorax, pleural fluid or consolidation. Included ribs
are intact.

Hepatobiliary: Evaluation for injury is limited in the absence of IV
contrast. No focal hepatic abnormality or perihepatic hematoma.
Clips in the gallbladder fossa postcholecystectomy. No biliary
dilatation.

Pancreas: Parenchymal atrophy. Calcification in the pancreatic head
suggests chronic pancreatitis. No acute pancreatic inflammation. No
ductal dilatation.

Spleen: Evaluation for injury is limited in the absence of IV
contrast. No focal abnormality. No perisplenic hematoma.

Adrenals/Urinary Tract: 18 mm low-density left adrenal nodule is
consistent with adenoma. No adrenal hemorrhage. No renal calculi. No
hydronephrosis. No obvious renal injury or perinephric fluid
collection allowing for lack of IV contrast. Small cyst in the
posterior right kidney measures 13 mm. Low-density lesion in the
posterior mid left kidney measures simple fluid density and is
likely cyst, but incompletely characterized given size and lack of
IV contrast. Urinary bladder is unremarkable. No bladder stone, wall
thickening, or evidence of injury. No perivesicular fluid or
stranding.

Stomach/Bowel: No evidence of bowel injury or mesenteric hematoma.
Surgical clips at the gastroesophageal junction. Small hiatal
hernia. Stomach is unremarkable. No small bowel obstruction or
inflammation. Small duodenal diverticulum. Appendix not confidently
visualized, no evidence of appendicitis. Moderate volume of stool in
the colon. Diverticulosis from the descending through the sigmoid.
No diverticulitis.

Vascular/Lymphatic: Aortic atherosclerosis. No retroperitoneal fluid
to suggest vascular injury. No adenopathy.

Reproductive: Status post hysterectomy. No adnexal masses.

Other: No intra-abdominal free air or free fluid. Fat in both
inguinal canals. Fat containing supraumbilical ventral abdominal
wall hernia. Patchy subcutaneous edema in the upper anterior
abdominal wall. No confluent hematoma in the subcutaneous tissues.

Musculoskeletal: No acute fracture of the lumbar spine,
sacrum/coccyx, or pelvis. Cortical margins of the pelvis are intact.
Benign-appearing sclerotic lesion in the right proximal femur.
Degenerative change of the sacroiliac joints. Multilevel
degenerative change in the lumbar spine. No confluent intramuscular
hematoma on noncontrast exam.
IMPRESSION: 1. No evidence of acute traumatic injury to the abdomen or pelvis
allowing for lack of IV contrast. No explanation for hematuria.
2. No acute fracture of the lumbar spine, sacrum/coccyx, or pelvis.
3. Subcutaneous fat stranding in the upper abdomen is more typical
of medication injection site than soft tissue contusion.
4. Incidental findings of colonic diverticulosis. Left adrenal
adenoma. Fat containing supraumbilical ventral abdominal wall
hernia.

Aortic Atherosclerosis ([id number]).

## 2020-10-22 ENCOUNTER — Ambulatory Visit
Admission: RE | Admit: 2020-10-22 | Discharge: 2020-10-22 | Disposition: A | Discharge status: Home / Self Care | Payer: Medicare Other | Source: Ambulatory Visit | Attending: Radiation Oncology | Admitting: Radiation Oncology

## 2020-10-22 ENCOUNTER — Other Ambulatory Visit: Payer: Self-pay

## 2020-10-22 DIAGNOSIS — Z510 Encounter for antineoplastic radiation therapy: Secondary | ICD-10-CM | POA: Diagnosis not present

## 2020-10-22 DIAGNOSIS — D352 Benign neoplasm of pituitary gland: Secondary | ICD-10-CM | POA: Diagnosis not present

## 2020-10-23 ENCOUNTER — Ambulatory Visit
Admission: RE | Admit: 2020-10-23 | Discharge: 2020-10-23 | Disposition: A | Discharge status: Home / Self Care | Payer: Medicare Other | Source: Ambulatory Visit | Attending: Radiation Oncology | Admitting: Radiation Oncology

## 2020-10-23 DIAGNOSIS — D352 Benign neoplasm of pituitary gland: Secondary | ICD-10-CM | POA: Diagnosis not present

## 2020-10-23 DIAGNOSIS — Z510 Encounter for antineoplastic radiation therapy: Secondary | ICD-10-CM | POA: Diagnosis not present

## 2020-10-26 ENCOUNTER — Ambulatory Visit
Admission: RE | Admit: 2020-10-26 | Discharge: 2020-10-26 | Disposition: A | Discharge status: Home / Self Care | Payer: Medicare Other | Source: Ambulatory Visit | Attending: Radiation Oncology | Admitting: Radiation Oncology

## 2020-10-26 ENCOUNTER — Other Ambulatory Visit: Payer: Self-pay

## 2020-10-26 DIAGNOSIS — D352 Benign neoplasm of pituitary gland: Secondary | ICD-10-CM | POA: Diagnosis not present

## 2020-10-26 DIAGNOSIS — Z510 Encounter for antineoplastic radiation therapy: Secondary | ICD-10-CM | POA: Insufficient documentation

## 2020-10-27 ENCOUNTER — Ambulatory Visit
Admission: RE | Admit: 2020-10-27 | Discharge: 2020-10-27 | Disposition: A | Discharge status: Home / Self Care | Payer: Medicare Other | Source: Ambulatory Visit | Attending: Radiation Oncology | Admitting: Radiation Oncology

## 2020-10-27 ENCOUNTER — Other Ambulatory Visit: Payer: Self-pay

## 2020-10-27 DIAGNOSIS — Z510 Encounter for antineoplastic radiation therapy: Secondary | ICD-10-CM | POA: Diagnosis not present

## 2020-10-27 DIAGNOSIS — D352 Benign neoplasm of pituitary gland: Secondary | ICD-10-CM | POA: Diagnosis not present

## 2020-10-28 ENCOUNTER — Ambulatory Visit
Admission: RE | Admit: 2020-10-28 | Discharge: 2020-10-28 | Disposition: A | Discharge status: Home / Self Care | Payer: Medicare Other | Source: Ambulatory Visit | Attending: Radiation Oncology | Admitting: Radiation Oncology

## 2020-10-28 ENCOUNTER — Encounter: Payer: Self-pay | Admitting: Endocrinology

## 2020-10-28 ENCOUNTER — Other Ambulatory Visit: Payer: Self-pay

## 2020-10-28 ENCOUNTER — Ambulatory Visit: Payer: Medicare Other | Admitting: Endocrinology

## 2020-10-28 ENCOUNTER — Ambulatory Visit: Payer: Medicare Other

## 2020-10-28 VITALS — BP 104/58 | HR 90 | Ht 65.0 in | Wt 156.0 lb

## 2020-10-28 DIAGNOSIS — Z510 Encounter for antineoplastic radiation therapy: Secondary | ICD-10-CM | POA: Diagnosis not present

## 2020-10-28 DIAGNOSIS — Z23 Encounter for immunization: Secondary | ICD-10-CM

## 2020-10-28 DIAGNOSIS — D352 Benign neoplasm of pituitary gland: Secondary | ICD-10-CM

## 2020-10-28 DIAGNOSIS — E119 Type 2 diabetes mellitus without complications: Secondary | ICD-10-CM

## 2020-10-28 LAB — BASIC METABOLIC PANEL
BUN: 15 mg/dL (ref 6–23)
CO2: 26 mEq/L (ref 19–32)
Calcium: 8.6 mg/dL (ref 8.4–10.5)
Chloride: 105 mEq/L (ref 96–112)
Creatinine, Ser: 0.74 mg/dL (ref 0.40–1.20)
GFR: 77.09 mL/min (ref >60.00)
Glucose, Bld: 117 mg/dL — ABNORMAL HIGH (ref 70–99)
Potassium: 3.8 mEq/L (ref 3.5–5.1)
Sodium: 140 mEq/L (ref 135–145)

## 2020-10-28 LAB — CORTISOL: Cortisol, Plasma: 5.7 ug/dL

## 2020-10-28 LAB — TSH: TSH: 2.29 u[IU]/mL (ref 0.35–4.50)

## 2020-10-28 LAB — T4, FREE: Free T4: 0.63 ng/dL (ref 0.60–1.60)

## 2020-10-28 LAB — POCT GLYCOSYLATED HEMOGLOBIN (HGB A1C): Hemoglobin A1C: 7.3 % — AB (ref 4.0–5.6)

## 2020-10-28 NOTE — Patient Instructions (Addendum)
Blood tests are requested for you today.  We'll let you know about the results.   Please continue the same 4 diabetes medications. check your blood sugar once a day.  vary the time of day when you check, between before the 3 meals, and at bedtime.  also check if you have symptoms of your blood sugar being too high or too low.  please keep a record of the readings and bring it to your next appointment here (or you can bring the meter itself).  You can write it on any piece of paper.  please call us sooner if your blood sugar goes below 70, or if you have a lot of readings over 200.   Please come back for a follow-up appointment in 4 months.

## 2020-10-28 NOTE — Progress Notes (Signed)
Subjective:    Patient ID: [name] White, female    DOB: [birth year], 79 y.o.   MRN: [id number]  HPI Pt returns for f/u of diabetes mellitus:  DM type: 2 Dx'ed: 1999, after an episode of pancreatitis (no pancreatitis since then).   Complications: PN Therapy: 3 oral meds + Victoza.  GDM: never DKA: never Severe hypoglycemia: never.    Pancreatic Imaging (2002): recurrent pancreatitis.  Other: she has never taken insulin; she cannot take bromocriptine, due to an interaction with maxalt; pioglitizone has caused edema; fructosamine has converted to lower A1c than A1c itself; GLP med is used, as pancreatitis was many years ago.   Interval history: pt says cbg's are well-controlled.  husb died 3 mos ago.    She had resection of nonsecretory pit tumor in 6/21; she is having XRT.  She has these pit function tests: TSH has chronic hypothyroidism: euthyroid on synthroid ACTH cortisol is normal Prolactin normal ASUB normal GH normal VP BMET and USG are normal FSH/LH: nor menopausal--pt is on E2 She denies polyuria.   Past Medical History:  Diagnosis Date  . Anxiety   . Asthma   . ASYMPTOMATIC POSTMENOPAUSAL STATUS 02/09/2009  . Cataract   . Constipation 03/19/2013  . Depression   . DIABETES MELLITUS, TYPE II 09/14/2007  . HYPERCHOLESTEROLEMIA 02/09/2009  . HYPERTENSION 02/09/2009  . HYPOTHYROIDISM 09/14/2007  . MIGRAINE HEADACHE 09/14/2007  . Neuropathy   . OSTEOPOROSIS 02/09/2009  . PANCREATITIS 09/14/2007  . 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 PONV (postoperative nausea and vomiting)   . Rectocele 03/19/2013  . Shortness of breath   . SUPERFICIAL PHLEBITIS 09/14/2007  . Varicose veins     Past Surgical History:  Procedure Laterality Date  . ABDOMINAL HYSTERECTOMY    . ANTERIOR AND POSTERIOR REPAIR N/A 04/02/2013   Procedure: ANTERIOR (CYSTOCELE) AND POSTERIOR REPAIR (RECTOCELE);  Surgeon: Jonnie Kind, MD;  Location: [facility];  Service: Gynecology;  Laterality: N/A;  . APPENDECTOMY     . BRAIN SURGERY    . CATARACT EXTRACTION W/PHACO  12/05/2011   Procedure: CATARACT EXTRACTION PHACO AND INTRAOCULAR LENS PLACEMENT (IOC);  Surgeon: Tonny Branch;  Location: [facility];  Service: Ophthalmology;  Laterality: Left;  CDE:9.65  . CHOLECYSTECTOMY    . CRANIOTOMY N/A 06/04/2020   Procedure: Endoscopic Transphenoidal Resection of Recurrent PituitaryTumor;  Surgeon: Kristeen Miss, MD;  Location: [facility];  Service: Neurosurgery;  Laterality: N/A;  . CYSTOSCOPY    . ENDOVENOUS ABLATION SAPHENOUS VEIN W/ LASER  11-03-2011   right greater saphenous vein   left leg done 10-2011  . EYE SURGERY  98   right cataract extraction 98  . LAPAROSCOPIC NISSEN FUNDOPLICATION    . NM ESOPHAGEAL REFLUX  08-11-11  . PITUITARY EXCISION  10/1997  . POSTERIOR REPAIR    . TRANSNASAL APPROACH N/A 06/04/2020   Procedure: TRANSNASAL APPROACH;  Surgeon: Jerrell Belfast, MD;  Location: [facility];  Service: ENT;  Laterality: N/A;  . TRANSPHENOIDAL / TRANSNASAL HYPOPHYSECTOMY / RESECTION PITUITARY TUMOR  08-11-11    Social History   Socioeconomic History  . Marital status: Married    Spouse name: Not on file  . Number of children: Not on file  . Years of education: Not on file  . Highest education level: Not on file  Occupational History  . Occupation: Retired    Fish farm manager: RETIRED  Tobacco Use  . Smoking status: Never Smoker  . Smokeless tobacco: Never Used  Vaping Use  . Vaping Use: Never used  Substance and Sexual Activity  . Alcohol use: No    Alcohol/week: 0.0 standard drinks  . Drug use: No  . Sexual activity: Not Currently    Birth control/protection: Surgical  Other Topics Concern  . Not on file  Social History Narrative  . Not on file   Social Determinants of Health   Financial Resource Strain:   . Difficulty of Paying Living Expenses: Not on file  Food Insecurity:   . Worried About Charity fundraiser in the Last Year: Not on file  . Ran Out of Food in the Last Year: Not on file   Transportation Needs:   . Lack of Transportation (Medical): Not on file  . Lack of Transportation (Non-Medical): Not on file  Physical Activity:   . Days of Exercise per Week: Not on file  . Minutes of Exercise per Session: Not on file  Stress:   . Feeling of Stress : Not on file  Social Connections:   . Frequency of Communication with Friends and Family: Not on file  . Frequency of Social Gatherings with Friends and Family: Not on file  . Attends Religious Services: Not on file  . Active Member of Clubs or Organizations: Not on file  . Attends Archivist Meetings: Not on file  . Marital Status: Not on file  Intimate Partner Violence:   . Fear of Current or Ex-Partner: Not on file  . Emotionally Abused: Not on file  . Physically Abused: Not on file  . Sexually Abused: Not on file    Current Outpatient Medications on File Prior to Visit  Medication Sig Dispense Refill  . ACCU-CHEK AVIVA PLUS test strip TEST TWICE DAILY. 100 strip 0  . albuterol (PROVENTIL HFA;VENTOLIN HFA) 108 (90 BASE) MCG/ACT inhaler Inhale 2 puffs into the lungs every 4 (four) hours as needed for wheezing or shortness of breath.     Marland Kitchen aspirin 81 MG tablet Take 81 mg by mouth daily.    Marland Kitchen atorvastatin (LIPITOR) 40 MG tablet Take 1 tablet (40 mg total) by mouth at bedtime. 90 tablet 3  . baclofen (LIORESAL) 10 MG tablet Take 10 mg by mouth daily.    Marland Kitchen BREO ELLIPTA 200-25 MCG/INH AEPB Inhale 1 puff into the lungs daily.     . carvedilol (COREG) 3.125 MG tablet Take 1 tablet (3.125 mg total) by mouth in the morning and at bedtime. 180 tablet 3  . cycloSPORINE (RESTASIS) 0.05 % ophthalmic emulsion Place 1 drop into both eyes 2 (two) times daily.      . dapagliflozin propanediol (FARXIGA) 5 MG TABS tablet Take 2.5 mg by mouth daily before breakfast. (Patient taking differently: Take 5 mg by mouth daily before breakfast. ) 15 tablet 11  . docusate sodium (COLACE) 100 MG capsule Take 1 capsule (100 mg total) by  mouth 2 (two) times daily. 10 capsule 0  . estradiol (ESTRACE) 1 MG tablet TAKE ONE TABLET (1MG  TOTAL) BY MOUTH DAILY (Patient taking differently: Take 1 mg by mouth daily. ) 90 tablet 1  . fluticasone (FLONASE) 50 MCG/ACT nasal spray Place 2 sprays into the nose at bedtime.    . furosemide (LASIX) 20 MG tablet May take Lasix 20 mg as NEEDED for leg swelling 45 tablet 3  . HYDROcodone-acetaminophen (NORCO/VICODIN) 5-325 MG tablet Take 1 tablet by mouth every 6 (six) hours as needed for moderate pain.     Marland Kitchen levothyroxine (SYNTHROID) 50 MCG tablet TAKE ONE TABLET (50MCG TOTAL) BY MOUTH DAILY BEFORE  BREAKFAST (Patient taking differently: Take 50 mcg by mouth daily before breakfast. ) 90 tablet 2  . lisinopril (ZESTRIL) 10 MG tablet Take 10 mg by mouth daily.     . metFORMIN (GLUCOPHAGE-XR) 500 MG 24 hr tablet TAKE TWO (2) TABLETS BY MOUTH DAILY (Patient taking differently: Take 1,000 mg by mouth daily with breakfast. ) 180 tablet 3  . methocarbamol (ROBAXIN) 500 MG tablet Take 1 tablet (500 mg total) by mouth 2 (two) times daily. 20 tablet 0  . Multiple Vitamins-Minerals (MULTIVITAMINS THER. W/MINERALS) TABS Take 1 tablet by mouth every morning.      Marland Kitchen omeprazole (PRILOSEC) 20 MG capsule Take 20 mg by mouth every morning.     . pregabalin (LYRICA) 150 MG capsule TAKE ONE CAPSULE (150 MG TOTAL) BY MOUTHTWO TIMES DAILY. 60 capsule 5  . repaglinide (PRANDIN) 1 MG tablet TAKE 1 TABLET BY MOUTH WITH BREAKFAST, 4 TABLETS WITH TABLETS WITH LUNCH, AND 4TABLETS WITH SUPPER (Patient taking differently: Take 1-4 mg by mouth See admin instructions. Take 1 mg by mouth in the morning with breakfast, 4 mg with lunch and 4 mg with dinner) 810 tablet 3  . rizatriptan (MAXALT-MLT) 10 MG disintegrating tablet Take 10 mg by mouth as needed for migraine. May repeat in 2 hours if needed    . traMADol (ULTRAM) 50 MG tablet Take 1 tablet (50 mg total) by mouth every 6 (six) hours as needed. 10 tablet 0  . venlafaxine  (EFFEXOR-XR) 150 MG 24 hr capsule Take 150 mg by mouth every morning.     . Vibegron (GEMTESA) 75 MG TABS Take 75 mg by mouth at bedtime.    Marland Kitchen VICTOZA 18 MG/3ML SOPN INJECT 0.3MLS (1.8MG  TOTAL) INTO THE SKIN DAILY 27 mL 4   No current facility-administered medications on file prior to visit.    Allergies  Allergen Reactions  . Betadine [Povidone Iodine] Hives  . Penicillins Rash    Family History  Problem Relation Age of Onset  . Heart disease Mother   . Asthma Mother   . COPD Father   . Heart disease Father   . Stroke Father   . Asthma Daughter   . Migraines Daughter   . Neuropathy Son   . Cancer Neg Hx   . Anesthesia problems Neg Hx   . Hypotension Neg Hx   . Malignant hyperthermia Neg Hx   . Pseudochol deficiency Neg Hx     BP (!) 104/58   Pulse 90   Ht 5\' 5"  (1.651 m)   Wt 156 lb (70.8 kg)   SpO2 94%   BMI 25.96 kg/m    Review of Systems She denies hypoglycemia.      Objective:   Physical Exam VITAL SIGNS:  See vs page.   GENERAL: no distress.   Pulses: dorsalis pedis intact bilat.   MSK: no deformity of the feet.   CV: trace bilat leg edema, and bilat vv's.   Skin:  no ulcer on the feet.  normal color and temp on the feet.   Neuro: sensation is intact to touch on the feet.   Ext: there is bilateral onychomycosis of the toenails.  Lab Results  Component Value Date   HGBA1C 7.3 (A) 10/28/2020       Assessment & Plan:  Type 2 DM: well-controlled Pituitary adenoma: recheck pit function tests.   Patient Instructions  Blood tests are requested for you today.  We'll let you know about the results.   Please continue the same  4 diabetes medications. check your blood sugar once a day.  vary the time of day when you check, between before the 3 meals, and at bedtime.  also check if you have symptoms of your blood sugar being too high or too low.  please keep a record of the readings and bring it to your next appointment here (or you can bring the meter  itself).  You can write it on any piece of paper.  please call us sooner if your blood sugar goes below 70, or if you have a lot of readings over 200.   Please come White for a follow-up appointment in 4 months.

## 2020-10-29 ENCOUNTER — Ambulatory Visit
Admission: RE | Admit: 2020-10-29 | Discharge: 2020-10-29 | Disposition: A | Discharge status: Home / Self Care | Payer: Medicare Other | Source: Ambulatory Visit | Attending: Radiation Oncology | Admitting: Radiation Oncology

## 2020-10-29 DIAGNOSIS — D352 Benign neoplasm of pituitary gland: Secondary | ICD-10-CM | POA: Diagnosis not present

## 2020-10-29 DIAGNOSIS — Z510 Encounter for antineoplastic radiation therapy: Secondary | ICD-10-CM | POA: Diagnosis not present

## 2020-10-29 LAB — LUTEINIZING HORMONE: LH: 2.09 m[IU]/mL

## 2020-10-29 LAB — PROLACTIN: Prolactin: 12.9 ng/mL

## 2020-10-29 LAB — FOLLICLE STIMULATING HORMONE: FSH: 14.8 m[IU]/mL

## 2020-10-30 ENCOUNTER — Ambulatory Visit
Admission: RE | Admit: 2020-10-30 | Discharge: 2020-10-30 | Disposition: A | Discharge status: Home / Self Care | Payer: Medicare Other | Source: Ambulatory Visit | Attending: Radiation Oncology | Admitting: Radiation Oncology

## 2020-10-30 ENCOUNTER — Ambulatory Visit: Payer: Medicare Other

## 2020-10-30 ENCOUNTER — Encounter: Payer: Self-pay | Admitting: Radiation Oncology

## 2020-10-30 DIAGNOSIS — Z510 Encounter for antineoplastic radiation therapy: Secondary | ICD-10-CM | POA: Diagnosis not present

## 2020-10-30 DIAGNOSIS — D352 Benign neoplasm of pituitary gland: Secondary | ICD-10-CM | POA: Diagnosis not present

## 2020-10-31 ENCOUNTER — Ambulatory Visit: Payer: Medicare Other

## 2020-11-10 ENCOUNTER — Other Ambulatory Visit: Payer: Self-pay | Admitting: Endocrinology

## 2020-11-18 ENCOUNTER — Other Ambulatory Visit: Payer: Self-pay | Admitting: Endocrinology

## 2020-11-18 DIAGNOSIS — E119 Type 2 diabetes mellitus without complications: Secondary | ICD-10-CM

## 2020-12-03 ENCOUNTER — Other Ambulatory Visit: Payer: Self-pay | Admitting: Endocrinology

## 2020-12-03 DIAGNOSIS — J4540 Moderate persistent asthma, uncomplicated: Secondary | ICD-10-CM | POA: Diagnosis not present

## 2020-12-03 DIAGNOSIS — I10 Essential (primary) hypertension: Secondary | ICD-10-CM | POA: Diagnosis not present

## 2020-12-03 DIAGNOSIS — E1165 Type 2 diabetes mellitus with hyperglycemia: Secondary | ICD-10-CM | POA: Diagnosis not present

## 2020-12-03 DIAGNOSIS — Z712 Person consulting for explanation of examination or test findings: Secondary | ICD-10-CM | POA: Diagnosis not present

## 2020-12-03 DIAGNOSIS — D352 Benign neoplasm of pituitary gland: Secondary | ICD-10-CM | POA: Diagnosis not present

## 2020-12-03 NOTE — Progress Notes (Signed)
[name] White presents for follow-up after completing radiation to her pituitary on 10/30/2020  Headaches:Patient states that she has not had any headaches. Patient states that she does feel wooziness in her head so she is using her cane. Patient states that she feels unsteady on her feet. Nausea/Appetite:Patient denies having nausea or vomiting. Patient states that she has a decrease in appetite. Patient states that her taste are not the same and she can not smell as good.  Filed Weights   12/04/20 1600  Weight: 153 lb 4 oz (69.5 kg)   Dizziness/Balance: Patient states she does feels off balance. Patient denies having dizziness.  Vision: Patient states that she thinks her vision is a little better. Memory: Patient states memory is that same and can remember things.  Numbness/Weakness: Patient denies having any numbness or weakness. Other issues of note: Continues F/U with Dr. Renato Shin. Patient states that she had her last visit with Dr. Loanne Drilling was on November 9th and has an upcoming appointment in March.   BP (!) 144/63 (BP Location: Left Arm, Patient Position: Sitting)   Pulse 88   Temp (!) 97.2 F (36.2 C) (Temporal)   Resp 18   Ht 5\' 5"  (1.651 m)   Wt 153 lb 4 oz (69.5 kg)   SpO2 98%   BMI 25.50 kg/m

## 2020-12-04 ENCOUNTER — Ambulatory Visit
Admission: RE | Admit: 2020-12-04 | Discharge: 2020-12-04 | Disposition: A | Discharge status: Home / Self Care | Payer: Medicare Other | Source: Ambulatory Visit | Attending: Radiation Oncology | Admitting: Radiation Oncology

## 2020-12-04 ENCOUNTER — Other Ambulatory Visit: Payer: Self-pay

## 2020-12-04 ENCOUNTER — Encounter: Payer: Self-pay | Admitting: Radiation Oncology

## 2020-12-04 VITALS — BP 144/63 | HR 88 | Temp 97.2°F | Resp 18 | Ht 65.0 in | Wt 153.2 lb

## 2020-12-04 DIAGNOSIS — R42 Dizziness and giddiness: Secondary | ICD-10-CM | POA: Insufficient documentation

## 2020-12-04 DIAGNOSIS — D353 Benign neoplasm of craniopharyngeal duct: Secondary | ICD-10-CM

## 2020-12-04 DIAGNOSIS — Z79899 Other long term (current) drug therapy: Secondary | ICD-10-CM | POA: Diagnosis not present

## 2020-12-04 DIAGNOSIS — Z7951 Long term (current) use of inhaled steroids: Secondary | ICD-10-CM | POA: Diagnosis not present

## 2020-12-04 DIAGNOSIS — D352 Benign neoplasm of pituitary gland: Secondary | ICD-10-CM | POA: Diagnosis not present

## 2020-12-04 DIAGNOSIS — Z923 Personal history of irradiation: Secondary | ICD-10-CM | POA: Diagnosis not present

## 2020-12-04 DIAGNOSIS — Z7982 Long term (current) use of aspirin: Secondary | ICD-10-CM | POA: Insufficient documentation

## 2020-12-08 ENCOUNTER — Encounter: Payer: Self-pay | Admitting: Radiation Oncology

## 2020-12-08 DIAGNOSIS — Z634 Disappearance and death of family member: Secondary | ICD-10-CM | POA: Diagnosis not present

## 2020-12-08 DIAGNOSIS — J4540 Moderate persistent asthma, uncomplicated: Secondary | ICD-10-CM | POA: Diagnosis not present

## 2020-12-08 DIAGNOSIS — E1165 Type 2 diabetes mellitus with hyperglycemia: Secondary | ICD-10-CM | POA: Diagnosis not present

## 2020-12-08 DIAGNOSIS — E039 Hypothyroidism, unspecified: Secondary | ICD-10-CM | POA: Diagnosis not present

## 2020-12-08 DIAGNOSIS — I10 Essential (primary) hypertension: Secondary | ICD-10-CM | POA: Diagnosis not present

## 2020-12-08 NOTE — Progress Notes (Signed)
Radiation Oncology         [phone] ________________________________  Name: [name] White MRN: [id number]  Date: 12/04/2020  DOB: 03-07-1941  Follow-Up Visit Note  Outpatient  CC: Celene Squibb, MD  Celene Squibb, MD  Diagnosis and Prior Radiotherapy:    ICD-10-CM   1. PITUITARY ADENOMA  D35.2    D35.3     Radiation Treatment Dates: 09/21/2020 through 10/30/2020 Site Technique Total Dose (Gy) Dose per Fx (Gy) Completed Fx Beam Energies  Brain: Brain_pituit IMRT 54/54 1.8 30/30 6X    CHIEF COMPLAINT: Here for follow-up and surveillance of pituitary tumor  Narrative:  The patient returns today for routine follow-up.   [name] White presents for follow-up after completing radiation to her pituitary on 10/30/2020  Headaches:Patient states that she has not had any headaches. Patient states that she does feel wooziness in her head so she is using her cane. Patient states that she feels unsteady on her feet. Nausea/Appetite:Patient denies having nausea or vomiting. Patient states that she has a decrease in appetite. Patient states that her taste are not the same and she can not smell as good.  Filed Weights   12/04/20 1600  Weight: 153 lb 4 oz (69.5 kg)   Dizziness/Balance: Patient states she does feels off balance. Patient denies having dizziness.  Vision: Patient states that she thinks her vision is a little better. Memory: Patient states memory is that same and can remember things.  Numbness/Weakness: Patient denies having any numbness or weakness. Other issues of note: Continues F/U with Dr. Renato Shin of endocrinology. Patient states that she had her last visit with Dr. Loanne Drilling was on November 9th and has an upcoming appointment in March.  She also continues to follow with her eye doctor.  BP (!) 144/63 (BP Location: Left Arm, Patient Position: Sitting)   Pulse 88   Temp (!) 97.2 F (36.2 C) (Temporal)   Resp 18   Ht 5\' 5"  (1.651 m)   Wt 153 lb 4 oz (69.5 kg)   SpO2 98%    BMI 25.50 kg/m                               ALLERGIES:  is allergic to betadine [povidone iodine] and penicillins.  Meds: Current Outpatient Medications  Medication Sig Dispense Refill  . ACCU-CHEK AVIVA PLUS test strip TEST TWICE DAILY. 100 strip 0  . albuterol (PROVENTIL HFA;VENTOLIN HFA) 108 (90 BASE) MCG/ACT inhaler Inhale 2 puffs into the lungs every 4 (four) hours as needed for wheezing or shortness of breath.    Marland Kitchen aspirin 81 MG tablet Take 81 mg by mouth daily.    Marland Kitchen atorvastatin (LIPITOR) 40 MG tablet Take 1 tablet (40 mg total) by mouth at bedtime. 90 tablet 3  . baclofen (LIORESAL) 10 MG tablet Take 10 mg by mouth daily.    Marland Kitchen BREO ELLIPTA 200-25 MCG/INH AEPB Inhale 1 puff into the lungs daily.     . carvedilol (COREG) 3.125 MG tablet Take 1 tablet (3.125 mg total) by mouth in the morning and at bedtime. 180 tablet 3  . cycloSPORINE (RESTASIS) 0.05 % ophthalmic emulsion Place 1 drop into both eyes 2 (two) times daily.    . dapagliflozin propanediol (FARXIGA) 5 MG TABS tablet Take 2.5 mg by mouth daily before breakfast. (Patient taking differently: Take 5 mg by mouth daily before breakfast.) 15 tablet 11  . docusate sodium (COLACE) 100 MG  capsule Take 1 capsule (100 mg total) by mouth 2 (two) times daily. 10 capsule 0  . estradiol (ESTRACE) 1 MG tablet TAKE ONE TABLET (1MG  TOTAL) BY MOUTH DAILY (Patient taking differently: Take 1 mg by mouth daily.) 90 tablet 1  . fluticasone (FLONASE) 50 MCG/ACT nasal spray Place 2 sprays into the nose at bedtime.    . furosemide (LASIX) 20 MG tablet May take Lasix 20 mg as NEEDED for leg swelling 45 tablet 3  . HYDROcodone-acetaminophen (NORCO/VICODIN) 5-325 MG tablet Take 1 tablet by mouth every 6 (six) hours as needed for moderate pain.     Marland Kitchen levothyroxine (SYNTHROID) 50 MCG tablet TAKE ONE TABLET (50MCG TOTAL) BY MOUTH DAILY BEFORE BREAKFAST (Patient taking differently: Take 50 mcg by mouth daily before breakfast.) 90 tablet 2  . lisinopril  (ZESTRIL) 10 MG tablet Take 10 mg by mouth daily.    . metFORMIN (GLUCOPHAGE-XR) 500 MG 24 hr tablet TAKE TWO (2) TABLETS BY MOUTH DAILY 180 tablet 3  . methocarbamol (ROBAXIN) 500 MG tablet Take 1 tablet (500 mg total) by mouth 2 (two) times daily. 20 tablet 0  . Multiple Vitamins-Minerals (MULTIVITAMINS THER. W/MINERALS) TABS Take 1 tablet by mouth every morning.    Marland Kitchen omeprazole (PRILOSEC) 20 MG capsule Take 20 mg by mouth every morning.    . pregabalin (LYRICA) 150 MG capsule TAKE ONE CAPSULE (150 MG TOTAL) BY MOUTHTWO TIMES DAILY. 60 capsule 5  . repaglinide (PRANDIN) 1 MG tablet TAKE ONE TABLET BY MOUTH WITH BREAKFAST,4 TABLETS WITH LUNCH, AND 4 TABLETS WITHSUPPER 810 tablet 3  . rizatriptan (MAXALT-MLT) 10 MG disintegrating tablet Take 10 mg by mouth as needed for migraine. May repeat in 2 hours if needed    . traMADol (ULTRAM) 50 MG tablet Take 1 tablet (50 mg total) by mouth every 6 (six) hours as needed. 10 tablet 0  . UNIFINE PENTIPS 31G X 6 MM MISC USE TO INJECT VICTOZA ONCE DAILY 100 each 2  . venlafaxine (EFFEXOR-XR) 150 MG 24 hr capsule Take 150 mg by mouth every morning.    . Vibegron (GEMTESA) 75 MG TABS Take 75 mg by mouth at bedtime.    Marland Kitchen VICTOZA 18 MG/3ML SOPN INJECT 0.3MLS (1.8MG  TOTAL) INTO THE SKIN DAILY 27 mL 4   No current facility-administered medications for this encounter.    Physical Findings: The patient is in no acute distress. Patient is alert and oriented.  height is 5\' 5"  (1.651 m) and weight is 153 lb 4 oz (69.5 kg). Her temporal temperature is 97.2 F (36.2 C) (abnormal). Her blood pressure is 144/63 (abnormal) and her pulse is 88. Her respiration is 18 and oxygen saturation is 98%. .    Extraocular movements are intact.  Peripheral vision is grossly intact.  She is able to walk independently with a cane and her gait is grossly stable down the hallway.  She is alert and oriented.  Lab Findings: Lab Results  Component Value Date   WBC 15.8 (H) 06/05/2020    HGB 12.5 06/05/2020   HCT 38.7 06/05/2020   MCV 93.5 06/05/2020   PLT 233 06/05/2020    Radiographic Findings: No results found.  Impression/Plan: She is doing well after radiation therapy for her pituitary adenoma.  She subjectively feels that her vision has improved a bit.  She subjectively feels that her balance is a little bit worse (this is a baseline issue for her ) but she does not feel dizzy or lightheaded.  She will continue to  ambulate with a cane.  She will continue to follow-up with her eye doctor and endocrinology.  I recommend that we perform an MRI, pituitary protocol, in 5 months.  I think she would benefit from following with our neurooncologist, Dr. Mickeal Skinner, in the long-term.  He will be able to follow her neurologic symptoms and her scans will be discussed collectively at our tumor board.  The patient is pleased with this plan.  She knows to call us if she has any questions in the interim.  I will see her back on an as-needed basis.  On date of service, in total, I spent 15 minutes on this encounter. Patient was seen in person.  _____________________________________   Eppie Gibson, MD

## 2020-12-14 DIAGNOSIS — H04123 Dry eye syndrome of bilateral lacrimal glands: Secondary | ICD-10-CM | POA: Diagnosis not present

## 2020-12-14 DIAGNOSIS — D352 Benign neoplasm of pituitary gland: Secondary | ICD-10-CM | POA: Diagnosis not present

## 2020-12-14 DIAGNOSIS — H52203 Unspecified astigmatism, bilateral: Secondary | ICD-10-CM | POA: Diagnosis not present

## 2020-12-14 DIAGNOSIS — H5340 Unspecified visual field defects: Secondary | ICD-10-CM | POA: Diagnosis not present

## 2020-12-14 NOTE — Progress Notes (Signed)
  Patient Name: [name] White MRN: [id number] DOB: 1941/04/02 Referring Physician: Wende Neighbors (Profile Not Attached) Date of Service: 10/30/2020  [facility] [place]                                                        End Of Treatment Note  Diagnoses: D35.2-Benign neoplasm of pituitary gland  Intent: Curative  Radiation Treatment Dates: 09/21/2020 through 10/30/2020 Site Technique Total Dose (Gy) Dose per Fx (Gy) Completed Fx Beam Energies  Brain: Brain_pituit IMRT 54/54 1.8 30/30 6X   Narrative: The patient tolerated radiation therapy relatively well. She subjectively noted some improvement in her vision.  Plan: The patient will follow-up with radiation oncology in 31mo. -----------------------------------  Eppie Gibson, MD

## 2021-02-23 ENCOUNTER — Other Ambulatory Visit: Payer: Self-pay

## 2021-02-25 ENCOUNTER — Ambulatory Visit: Payer: Medicare Other | Admitting: Endocrinology

## 2021-02-25 ENCOUNTER — Other Ambulatory Visit: Payer: Self-pay

## 2021-02-25 VITALS — BP 108/70 | HR 90 | Ht 65.0 in | Wt 149.2 lb

## 2021-02-25 DIAGNOSIS — E119 Type 2 diabetes mellitus without complications: Secondary | ICD-10-CM | POA: Diagnosis not present

## 2021-02-25 LAB — POCT GLYCOSYLATED HEMOGLOBIN (HGB A1C): Hemoglobin A1C: 6.9 % — AB (ref 4.0–5.6)

## 2021-02-25 NOTE — Progress Notes (Signed)
Subjective:    Patient ID: [name] White, female    DOB: [birth year], 80 y.o.   MRN: [id number]  HPI Pt returns for f/u of diabetes mellitus:  DM type: 2 Dx'ed: Apr 10, 1998, after an episode of pancreatitis (no pancreatitis since then).   Complications: PN Therapy: 3 oral meds + Victoza.  GDM: never DKA: never Severe hypoglycemia: never.    Pancreatic Imaging 04-10-01): recurrent pancreatitis.  SDOH: husb died 04-10-20.   Other: she has never taken insulin; she cannot take bromocriptine, due to an interaction with maxalt; pioglitizone has caused edema; fructosamine has converted to lower A1c than A1c itself; GLP med is used, as pancreatitis was many years ago.   Interval history: pt says cbg's are well-controlled.     She had resection of nonsecretory pit tumor in 6/21; she is having XRT.  She has these pit function tests: TSH chronic primary hypothyroidism predated the above; on synthroid ACTH cortisol is normal Prolactin normal ASUB normal GH normal VP BMET and USG are normal FSH/LH: not menopausal--E2 is rx'ed by GYN.   Pt says she does not have time to do ACTH stim test today.  Past Medical History:  Diagnosis Date  . Anxiety   . Asthma   . ASYMPTOMATIC POSTMENOPAUSAL STATUS 02/09/2009  . Cataract   . Constipation 03/19/2013  . Depression   . DIABETES MELLITUS, TYPE II 09/14/2007  . HYPERCHOLESTEROLEMIA 02/09/2009  . HYPERTENSION 02/09/2009  . HYPOTHYROIDISM 09/14/2007  . MIGRAINE HEADACHE 09/14/2007  . Neuropathy   . OSTEOPOROSIS 02/09/2009  . PANCREATITIS 09/14/2007  . 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 PONV (postoperative nausea and vomiting)   . Rectocele 03/19/2013  . Shortness of breath   . SUPERFICIAL PHLEBITIS 09/14/2007  . Varicose veins     Past Surgical History:  Procedure Laterality Date  . ABDOMINAL HYSTERECTOMY    . ANTERIOR AND POSTERIOR REPAIR N/A 04/02/2013   Procedure: ANTERIOR (CYSTOCELE) AND POSTERIOR REPAIR (RECTOCELE);  Surgeon: Jonnie Kind, MD;  Location: [facility];  Service: Gynecology;  Laterality: N/A;  . APPENDECTOMY    . BRAIN SURGERY    . CATARACT EXTRACTION W/PHACO  12/05/2011   Procedure: CATARACT EXTRACTION PHACO AND INTRAOCULAR LENS PLACEMENT (IOC);  Surgeon: Tonny Branch;  Location: [facility];  Service: Ophthalmology;  Laterality: Left;  CDE:9.65  . CHOLECYSTECTOMY    . CRANIOTOMY N/A 06/04/2020   Procedure: Endoscopic Transphenoidal Resection of Recurrent PituitaryTumor;  Surgeon: Kristeen Miss, MD;  Location: [facility];  Service: Neurosurgery;  Laterality: N/A;  . CYSTOSCOPY    . ENDOVENOUS ABLATION SAPHENOUS VEIN W/ LASER  11-03-2011   right greater saphenous vein   left leg done 10-2011  . EYE SURGERY  98   right cataract extraction 98  . LAPAROSCOPIC NISSEN FUNDOPLICATION    . NM ESOPHAGEAL REFLUX  08-11-11  . PITUITARY EXCISION  10/1997  . POSTERIOR REPAIR    . TRANSNASAL APPROACH N/A 06/04/2020   Procedure: TRANSNASAL APPROACH;  Surgeon: Jerrell Belfast, MD;  Location: [facility];  Service: ENT;  Laterality: N/A;  . TRANSPHENOIDAL / TRANSNASAL HYPOPHYSECTOMY / RESECTION PITUITARY TUMOR  08-11-11    Social History   Socioeconomic History  . Marital status: Married    Spouse name: Not on file  . Number of children: Not on file  . Years of education: Not on file  . Highest education level: Not on file  Occupational History  . Occupation: Retired    Fish farm manager: RETIRED  Tobacco Use  . Smoking status: Never Smoker  .  Smokeless tobacco: Never Used  Vaping Use  . Vaping Use: Never used  Substance and Sexual Activity  . Alcohol use: No    Alcohol/week: 0.0 standard drinks  . Drug use: No  . Sexual activity: Not Currently    Birth control/protection: Surgical  Other Topics Concern  . Not on file  Social History Narrative  . Not on file   Social Determinants of Health   Financial Resource Strain: Not on file  Food Insecurity: Not on file  Transportation Needs: Not on file  Physical Activity: Not on file  Stress: Not on file   Social Connections: Not on file  Intimate Partner Violence: Not on file    Current Outpatient Medications on File Prior to Visit  Medication Sig Dispense Refill  . ACCU-CHEK AVIVA PLUS test strip TEST TWICE DAILY. 100 strip 0  . albuterol (PROVENTIL HFA;VENTOLIN HFA) 108 (90 BASE) MCG/ACT inhaler Inhale 2 puffs into the lungs every 4 (four) hours as needed for wheezing or shortness of breath.    Marland Kitchen aspirin 81 MG tablet Take 81 mg by mouth daily.    Marland Kitchen atorvastatin (LIPITOR) 40 MG tablet Take 1 tablet (40 mg total) by mouth at bedtime. 90 tablet 3  . baclofen (LIORESAL) 10 MG tablet Take 10 mg by mouth daily.    Marland Kitchen BREO ELLIPTA 200-25 MCG/INH AEPB Inhale 1 puff into the lungs daily.     . carvedilol (COREG) 3.125 MG tablet Take 1 tablet (3.125 mg total) by mouth in the morning and at bedtime. 180 tablet 3  . cycloSPORINE (RESTASIS) 0.05 % ophthalmic emulsion Place 1 drop into both eyes 2 (two) times daily.    . dapagliflozin propanediol (FARXIGA) 5 MG TABS tablet Take 2.5 mg by mouth daily before breakfast. (Patient taking differently: Take 5 mg by mouth daily before breakfast.) 15 tablet 11  . docusate sodium (COLACE) 100 MG capsule Take 1 capsule (100 mg total) by mouth 2 (two) times daily. 10 capsule 0  . estradiol (ESTRACE) 1 MG tablet TAKE ONE TABLET (1MG  TOTAL) BY MOUTH DAILY (Patient taking differently: Take 1 mg by mouth daily.) 90 tablet 1  . fluticasone (FLONASE) 50 MCG/ACT nasal spray Place 2 sprays into the nose at bedtime.    . furosemide (LASIX) 20 MG tablet May take Lasix 20 mg as NEEDED for leg swelling 45 tablet 3  . HYDROcodone-acetaminophen (NORCO/VICODIN) 5-325 MG tablet Take 1 tablet by mouth every 6 (six) hours as needed for moderate pain.     Marland Kitchen levothyroxine (SYNTHROID) 50 MCG tablet TAKE ONE TABLET (50MCG TOTAL) BY MOUTH DAILY BEFORE BREAKFAST (Patient taking differently: Take 50 mcg by mouth daily before breakfast.) 90 tablet 2  . lisinopril (ZESTRIL) 10 MG tablet Take 10  mg by mouth daily.    . metFORMIN (GLUCOPHAGE-XR) 500 MG 24 hr tablet TAKE TWO (2) TABLETS BY MOUTH DAILY 180 tablet 3  . methocarbamol (ROBAXIN) 500 MG tablet Take 1 tablet (500 mg total) by mouth 2 (two) times daily. 20 tablet 0  . Multiple Vitamins-Minerals (MULTIVITAMINS THER. W/MINERALS) TABS Take 1 tablet by mouth every morning.    Marland Kitchen omeprazole (PRILOSEC) 20 MG capsule Take 20 mg by mouth every morning.    . pregabalin (LYRICA) 150 MG capsule TAKE ONE CAPSULE (150 MG TOTAL) BY MOUTHTWO TIMES DAILY. 60 capsule 5  . repaglinide (PRANDIN) 1 MG tablet TAKE ONE TABLET BY MOUTH WITH BREAKFAST,4 TABLETS WITH LUNCH, AND 4 TABLETS WITHSUPPER 810 tablet 3  . rizatriptan (MAXALT-MLT) 10 MG  disintegrating tablet Take 10 mg by mouth as needed for migraine. May repeat in 2 hours if needed    . traMADol (ULTRAM) 50 MG tablet Take 1 tablet (50 mg total) by mouth every 6 (six) hours as needed. 10 tablet 0  . UNIFINE PENTIPS 31G X 6 MM MISC USE TO INJECT VICTOZA ONCE DAILY 100 each 2  . venlafaxine (EFFEXOR-XR) 150 MG 24 hr capsule Take 150 mg by mouth every morning.    . Vibegron (GEMTESA) 75 MG TABS Take 75 mg by mouth at bedtime.    Marland Kitchen VICTOZA 18 MG/3ML SOPN INJECT 0.3MLS (1.8MG  TOTAL) INTO THE SKIN DAILY 27 mL 4   No current facility-administered medications on file prior to visit.    Allergies  Allergen Reactions  . Betadine [Povidone Iodine] Hives  . Penicillins Rash    Family History  Problem Relation Age of Onset  . Heart disease Mother   . Asthma Mother   . COPD Father   . Heart disease Father   . Stroke Father   . Asthma Daughter   . Migraines Daughter   . Neuropathy Son   . Cancer Neg Hx   . Anesthesia problems Neg Hx   . Hypotension Neg Hx   . Malignant hyperthermia Neg Hx   . Pseudochol deficiency Neg Hx     BP 108/70 (BP Location: Right Arm, Patient Position: Sitting, Cuff Size: Normal)   Pulse 90   Ht 5\' 5"  (1.651 m)   Wt 149 lb 3.2 oz (67.7 kg)   SpO2 95%   BMI 24.83  kg/m   Review of Systems She denies hypoglycemia.      Objective:   Physical Exam VITAL SIGNS:  See vs page.   GENERAL: no distress.   Pulses: dorsalis pedis intact bilat.   MSK: no deformity of the feet.   CV: trace bilat leg edema, and bilat vv's.   Skin:  no ulcer on the feet.  normal color and temp on the feet.   Neuro: sensation is intact to touch on the feet.   Ext: there is bilateral onychomycosis of the toenails.   A1c=6.9%    Assessment & Plan:  Type 2 DM, with PN: well-controlled.  Pit insuff: we discussed.  She needs to r/o HPA insuff.  Patient Instructions  Please continue the same 4 diabetes medications. check your blood sugar once a day.  vary the time of day when you check, between before the 3 meals, and at bedtime.  also check if you have symptoms of your blood sugar being too high or too low.  please keep a record of the readings and bring it to your next appointment here (or you can bring the meter itself).  You can write it on any piece of paper.  please call us sooner if your blood sugar goes below 70, or if you have a lot of readings over 200.   Please come White for a follow-up appointment in 4 months, when you should have the before and after test.

## 2021-02-25 NOTE — Patient Instructions (Addendum)
Please continue the same 4 diabetes medications. check your blood sugar once a day.  vary the time of day when you check, between before the 3 meals, and at bedtime.  also check if you have symptoms of your blood sugar being too high or too low.  please keep a record of the readings and bring it to your next appointment here (or you can bring the meter itself).  You can write it on any piece of paper.  please call us sooner if your blood sugar goes below 70, or if you have a lot of readings over 200.   Please come back for a follow-up appointment in 4 months, when you should have the before and after test.

## 2021-03-08 ENCOUNTER — Other Ambulatory Visit: Payer: Self-pay | Admitting: Endocrinology

## 2021-03-17 ENCOUNTER — Other Ambulatory Visit: Payer: Self-pay | Admitting: Radiation Therapy

## 2021-03-17 DIAGNOSIS — D352 Benign neoplasm of pituitary gland: Secondary | ICD-10-CM

## 2021-03-18 ENCOUNTER — Other Ambulatory Visit: Payer: Self-pay | Admitting: Radiation Therapy

## 2021-04-01 DIAGNOSIS — E039 Hypothyroidism, unspecified: Secondary | ICD-10-CM | POA: Diagnosis not present

## 2021-04-01 DIAGNOSIS — I739 Peripheral vascular disease, unspecified: Secondary | ICD-10-CM | POA: Diagnosis not present

## 2021-04-01 DIAGNOSIS — E1165 Type 2 diabetes mellitus with hyperglycemia: Secondary | ICD-10-CM | POA: Diagnosis not present

## 2021-04-01 DIAGNOSIS — I10 Essential (primary) hypertension: Secondary | ICD-10-CM | POA: Diagnosis not present

## 2021-04-05 DIAGNOSIS — J4540 Moderate persistent asthma, uncomplicated: Secondary | ICD-10-CM | POA: Diagnosis not present

## 2021-04-05 DIAGNOSIS — E039 Hypothyroidism, unspecified: Secondary | ICD-10-CM | POA: Diagnosis not present

## 2021-04-05 DIAGNOSIS — E782 Mixed hyperlipidemia: Secondary | ICD-10-CM | POA: Diagnosis not present

## 2021-04-05 DIAGNOSIS — D352 Benign neoplasm of pituitary gland: Secondary | ICD-10-CM | POA: Diagnosis not present

## 2021-04-05 DIAGNOSIS — I10 Essential (primary) hypertension: Secondary | ICD-10-CM | POA: Diagnosis not present

## 2021-04-05 DIAGNOSIS — R809 Proteinuria, unspecified: Secondary | ICD-10-CM | POA: Diagnosis not present

## 2021-04-05 DIAGNOSIS — E1165 Type 2 diabetes mellitus with hyperglycemia: Secondary | ICD-10-CM | POA: Diagnosis not present

## 2021-04-29 ENCOUNTER — Ambulatory Visit
Admission: RE | Admit: 2021-04-29 | Discharge: 2021-04-29 | Disposition: A | Discharge status: Home / Self Care | Payer: Medicare Other | Source: Ambulatory Visit | Attending: Radiation Oncology | Admitting: Radiation Oncology

## 2021-04-29 ENCOUNTER — Other Ambulatory Visit: Payer: Self-pay

## 2021-04-29 DIAGNOSIS — D352 Benign neoplasm of pituitary gland: Secondary | ICD-10-CM

## 2021-04-29 DIAGNOSIS — C729 Malignant neoplasm of central nervous system, unspecified: Secondary | ICD-10-CM | POA: Diagnosis not present

## 2021-04-29 MED ORDER — GADOBENATE DIMEGLUMINE 529 MG/ML IV SOLN
10.0000 mL | Freq: Once | INTRAVENOUS | Status: AC | PRN
  Administered 2021-04-29: 8 mL via INTRAVENOUS

## 2021-05-03 ENCOUNTER — Inpatient Hospital Stay: Payer: Medicare Other | Attending: Internal Medicine

## 2021-05-03 DIAGNOSIS — I10 Essential (primary) hypertension: Secondary | ICD-10-CM | POA: Insufficient documentation

## 2021-05-03 DIAGNOSIS — D352 Benign neoplasm of pituitary gland: Secondary | ICD-10-CM | POA: Insufficient documentation

## 2021-05-03 DIAGNOSIS — E119 Type 2 diabetes mellitus without complications: Secondary | ICD-10-CM | POA: Insufficient documentation

## 2021-05-03 DIAGNOSIS — H532 Diplopia: Secondary | ICD-10-CM | POA: Insufficient documentation

## 2021-05-03 DIAGNOSIS — D353 Benign neoplasm of craniopharyngeal duct: Secondary | ICD-10-CM | POA: Insufficient documentation

## 2021-05-04 ENCOUNTER — Encounter: Payer: Self-pay | Admitting: Internal Medicine

## 2021-05-04 ENCOUNTER — Inpatient Hospital Stay: Payer: Medicare Other | Admitting: Internal Medicine

## 2021-05-04 ENCOUNTER — Other Ambulatory Visit: Payer: Self-pay

## 2021-05-04 VITALS — BP 104/64 | HR 87 | Temp 97.4°F | Resp 19 | Ht 65.0 in | Wt 151.4 lb

## 2021-05-04 DIAGNOSIS — I10 Essential (primary) hypertension: Secondary | ICD-10-CM | POA: Diagnosis not present

## 2021-05-04 DIAGNOSIS — D352 Benign neoplasm of pituitary gland: Secondary | ICD-10-CM | POA: Diagnosis not present

## 2021-05-04 DIAGNOSIS — D353 Benign neoplasm of craniopharyngeal duct: Secondary | ICD-10-CM | POA: Diagnosis not present

## 2021-05-04 DIAGNOSIS — H532 Diplopia: Secondary | ICD-10-CM | POA: Diagnosis not present

## 2021-05-04 DIAGNOSIS — E119 Type 2 diabetes mellitus without complications: Secondary | ICD-10-CM | POA: Diagnosis not present

## 2021-05-04 NOTE — Progress Notes (Signed)
[facility] [address] [place], [place] [address] [phone]   New Patient Evaluation  Date of Service: 05/04/21 Patient Name: [name] White Patient MRN: [id number] Patient DOB: 1941-09-24 Provider: Ventura Sellers, MD  Identifying Statement:  [name] White is a 80 y.o. female with pituitary adenoma who presents for initial consultation and evaluation.    Referring Provider: Eppie Gibson, MD [address] [place],  [place] [address]  Oncologic History: Xx/xx/99: Gross total resection  06/04/20: Debulking transphenoidal resection of recurrent adenoma with Dr. Ellene Route and Dr. Zada Finders 10/30/20: Completes 6 weeks IMRT to residual adenoma with Dr. Isidore Moos   History of Present Illness: The patient's records from the referring physician were obtained and reviewed and the patient interviewed to confirm this HPI.  [name] White presents to clinic today following her 3 months post-IMRT MRI scan.  She describes no recurrence in "tunnel vision" symptoms which had affected her for the 6 months prior to her fall and repeat surgery.  She does experience some double vision, with objects "on top of each other" worse when looking to the right.  This is also not a new complaint.  Otherwise, she complains of "blurriness" that impairs her ability to read at times, not in one particular eye or visual field.  Is followed by ophthalmology.  Denies non-visual neurologic symptoms.  Lives at home with her daughter, is functional and independent.  Medications: Current Outpatient Medications on File Prior to Visit  Medication Sig Dispense Refill  . ACCU-CHEK AVIVA PLUS test strip TEST TWICE DAILY. 100 strip 0  . albuterol (PROVENTIL HFA;VENTOLIN HFA) 108 (90 BASE) MCG/ACT inhaler Inhale 2 puffs into the lungs every 4 (four) hours as needed for wheezing or shortness of breath.    Marland Kitchen aspirin 81 MG tablet Take 81 mg by mouth daily.    Marland Kitchen atorvastatin (LIPITOR) 40 MG  tablet Take 1 tablet (40 mg total) by mouth at bedtime. 90 tablet 3  . baclofen (LIORESAL) 10 MG tablet Take 10 mg by mouth daily.    Marland Kitchen BREO ELLIPTA 200-25 MCG/INH AEPB Inhale 1 puff into the lungs daily.     . carvedilol (COREG) 3.125 MG tablet Take 1 tablet (3.125 mg total) by mouth in the morning and at bedtime. 180 tablet 3  . cycloSPORINE (RESTASIS) 0.05 % ophthalmic emulsion Place 1 drop into both eyes 2 (two) times daily.    . dapagliflozin propanediol (FARXIGA) 5 MG TABS tablet Take 2.5 mg by mouth daily before breakfast. (Patient taking differently: Take 5 mg by mouth daily before breakfast.) 15 tablet 11  . docusate sodium (COLACE) 100 MG capsule Take 1 capsule (100 mg total) by mouth 2 (two) times daily. 10 capsule 0  . estradiol (ESTRACE) 1 MG tablet TAKE ONE TABLET (1MG  TOTAL) BY MOUTH DAILY (Patient taking differently: Take 1 mg by mouth daily.) 90 tablet 1  . fluticasone (FLONASE) 50 MCG/ACT nasal spray Place 2 sprays into the nose at bedtime.    . furosemide (LASIX) 20 MG tablet May take Lasix 20 mg as NEEDED for leg swelling 45 tablet 3  . HYDROcodone-acetaminophen (NORCO/VICODIN) 5-325 MG tablet Take 1 tablet by mouth every 6 (six) hours as needed for moderate pain.     Marland Kitchen levothyroxine (SYNTHROID) 50 MCG tablet TAKE ONE TABLET (50MCG TOTAL) BY MOUTH DAILY BEFORE BREAKFAST 90 tablet 2  . lisinopril (ZESTRIL) 10 MG tablet Take 10 mg by mouth daily.    . metFORMIN (  GLUCOPHAGE-XR) 500 MG 24 hr tablet TAKE TWO (2) TABLETS BY MOUTH DAILY 180 tablet 3  . Multiple Vitamins-Minerals (MULTIVITAMINS THER. W/MINERALS) TABS Take 1 tablet by mouth every morning.    Marland Kitchen omeprazole (PRILOSEC) 20 MG capsule Take 20 mg by mouth every morning.    . pregabalin (LYRICA) 150 MG capsule TAKE ONE CAPSULE (150 MG TOTAL) BY MOUTHTWO TIMES DAILY. 60 capsule 5  . repaglinide (PRANDIN) 1 MG tablet TAKE ONE TABLET BY MOUTH WITH BREAKFAST,4 TABLETS WITH LUNCH, AND 4 TABLETS WITHSUPPER 810 tablet 3  . rizatriptan  (MAXALT-MLT) 10 MG disintegrating tablet Take 10 mg by mouth as needed for migraine. May repeat in 2 hours if needed    . traMADol (ULTRAM) 50 MG tablet Take 1 tablet (50 mg total) by mouth every 6 (six) hours as needed. 10 tablet 0  . UNIFINE PENTIPS 31G X 6 MM MISC USE TO INJECT VICTOZA ONCE DAILY 100 each 2  . venlafaxine (EFFEXOR-XR) 150 MG 24 hr capsule Take 150 mg by mouth every morning.    . Vibegron (GEMTESA) 75 MG TABS Take 75 mg by mouth at bedtime.    Marland Kitchen VICTOZA 18 MG/3ML SOPN INJECT 0.3MLS (1.8MG  TOTAL) INTO THE SKIN DAILY 27 mL 4  . methocarbamol (ROBAXIN) 500 MG tablet Take 1 tablet (500 mg total) by mouth 2 (two) times daily. (Patient not taking: Reported on 05/04/2021) 20 tablet 0   No current facility-administered medications on file prior to visit.    Allergies:  Allergies  Allergen Reactions  . Betadine [Povidone Iodine] Hives  . Penicillins Rash   Past Medical History:  Past Medical History:  Diagnosis Date  . Anxiety   . Asthma   . ASYMPTOMATIC POSTMENOPAUSAL STATUS 02/09/2009  . Cataract   . Constipation 03/19/2013  . Depression   . DIABETES MELLITUS, TYPE II 09/14/2007  . HYPERCHOLESTEROLEMIA 02/09/2009  . HYPERTENSION 02/09/2009  . HYPOTHYROIDISM 09/14/2007  . MIGRAINE HEADACHE 09/14/2007  . Neuropathy   . OSTEOPOROSIS 02/09/2009  . PANCREATITIS 09/14/2007  . 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 PONV (postoperative nausea and vomiting)   . Rectocele 03/19/2013  . Shortness of breath   . SUPERFICIAL PHLEBITIS 09/14/2007  . Varicose veins    Past Surgical History:  Past Surgical History:  Procedure Laterality Date  . ABDOMINAL HYSTERECTOMY    . ANTERIOR AND POSTERIOR REPAIR N/A 04/02/2013   Procedure: ANTERIOR (CYSTOCELE) AND POSTERIOR REPAIR (RECTOCELE);  Surgeon: Jonnie Kind, MD;  Location: [facility];  Service: Gynecology;  Laterality: N/A;  . APPENDECTOMY    . BRAIN SURGERY    . CATARACT EXTRACTION W/PHACO  12/05/2011   Procedure: CATARACT EXTRACTION PHACO AND  INTRAOCULAR LENS PLACEMENT (IOC);  Surgeon: Tonny Branch;  Location: [facility];  Service: Ophthalmology;  Laterality: Left;  CDE:9.65  . CHOLECYSTECTOMY    . CRANIOTOMY N/A 06/04/2020   Procedure: Endoscopic Transphenoidal Resection of Recurrent PituitaryTumor;  Surgeon: Kristeen Miss, MD;  Location: [facility];  Service: Neurosurgery;  Laterality: N/A;  . CYSTOSCOPY    . ENDOVENOUS ABLATION SAPHENOUS VEIN W/ LASER  11-03-2011   right greater saphenous vein   left leg done 10-2011  . EYE SURGERY  98   right cataract extraction 98  . LAPAROSCOPIC NISSEN FUNDOPLICATION    . NM ESOPHAGEAL REFLUX  08-11-11  . PITUITARY EXCISION  10/1997  . POSTERIOR REPAIR    . TRANSNASAL APPROACH N/A 06/04/2020   Procedure: TRANSNASAL APPROACH;  Surgeon: Jerrell Belfast, MD;  Location: [facility];  Service: ENT;  Laterality:  N/A;  . TRANSPHENOIDAL / TRANSNASAL HYPOPHYSECTOMY / RESECTION PITUITARY TUMOR  08-11-11   Social History:  Social History   Socioeconomic History  . Marital status: Married    Spouse name: Not on file  . Number of children: Not on file  . Years of education: Not on file  . Highest education level: Not on file  Occupational History  . Occupation: Retired    Fish farm manager: RETIRED  Tobacco Use  . Smoking status: Never Smoker  . Smokeless tobacco: Never Used  Vaping Use  . Vaping Use: Never used  Substance and Sexual Activity  . Alcohol use: No    Alcohol/week: 0.0 standard drinks  . Drug use: No  . Sexual activity: Not Currently    Birth control/protection: Surgical  Other Topics Concern  . Not on file  Social History Narrative  . Not on file   Social Determinants of Health   Financial Resource Strain: Not on file  Food Insecurity: Not on file  Transportation Needs: Not on file  Physical Activity: Not on file  Stress: Not on file  Social Connections: Not on file  Intimate Partner Violence: Not on file   Family History:  Family History  Problem Relation Age of Onset  . Heart  disease Mother   . Asthma Mother   . COPD Father   . Heart disease Father   . Stroke Father   . Asthma Daughter   . Migraines Daughter   . Neuropathy Son   . Cancer Neg Hx   . Anesthesia problems Neg Hx   . Hypotension Neg Hx   . Malignant hyperthermia Neg Hx   . Pseudochol deficiency Neg Hx     Review of Systems: Constitutional: Doesn't report fevers, chills or abnormal weight loss Eyes: Doesn't report blurriness of vision Ears, nose, mouth, throat, and face: Doesn't report sore throat Respiratory: Doesn't report cough, dyspnea or wheezes Cardiovascular: Doesn't report palpitation, chest discomfort  Gastrointestinal:  Doesn't report nausea, constipation, diarrhea GU: Doesn't report incontinence Skin: Doesn't report skin rashes Neurological: Per HPI Musculoskeletal: Doesn't report joint pain Behavioral/Psych: Doesn't report anxiety  Physical Exam: Vitals:   05/04/21 0916  BP: 104/64  Pulse: 87  Resp: 19  Temp: (!) 97.4 F (36.3 C)  SpO2: 95%   KPS: 80. General: Alert, cooperative, pleasant, in no acute distress Head: Normal EENT: No conjunctival injection or scleral icterus.  Lungs: Resp effort normal Cardiac: Regular rate Abdomen: Non-distended abdomen Skin: No rashes cyanosis or petechiae. Extremities: No clubbing or edema  Neurologic Exam: Mental Status: Awake, alert, attentive to examiner. Oriented to self and environment. Language is fluent with intact comprehension.  Cranial Nerves: Visual acuity is grossly normal. Visual fields are full. Extra-ocular movements intact. No ptosis. Face is symmetric Motor: Tone and bulk are normal. Power is full in both arms and legs. Reflexes are symmetric, no pathologic reflexes present.  Sensory: Intact to light touch Gait: Normal.   Labs: I have reviewed the data as listed    Component Value Date/Time   NA 140 10/28/2020 1447   K 3.8 10/28/2020 1447   CL 105 10/28/2020 1447   CO2 26 10/28/2020 1447   GLUCOSE 117  (H) 10/28/2020 1447   BUN 15 10/28/2020 1447   CREATININE 0.74 10/28/2020 1447   CREATININE 0.72 10/03/2016 0706   CALCIUM 8.6 10/28/2020 1447   PROT 6.6 07/22/2019 0913   ALBUMIN 3.5 07/22/2019 0913   AST 19 07/22/2019 0913   ALT 22 07/22/2019 0913   ALKPHOS  27 (L) 07/22/2019 0913   BILITOT 1.2 07/22/2019 0913   GFRNONAA >60 06/05/2020 0606   GFRAA >60 06/05/2020 0606   Lab Results  Component Value Date   WBC 15.8 (H) 06/05/2020   NEUTROABS 6.6 02/06/2020   HGB 12.5 06/05/2020   HCT 38.7 06/05/2020   MCV 93.5 06/05/2020   PLT 233 06/05/2020    Imaging: [facility] Clinician Interpretation: I have personally reviewed the CNS images as listed.  My interpretation, in the context of the patient's clinical presentation, is stable disease  MR Brain W Wo Contrast  Result Date: 04/30/2021 CLINICAL DATA:  Brain/CNS neoplasm, assess treatment response. EXAM: MRI HEAD WITHOUT AND WITH CONTRAST TECHNIQUE: Multiplanar, multiecho pulse sequences of the brain and surrounding structures were obtained without and with intravenous contrast. CONTRAST:  77mL MULTIHANCE GADOBENATE DIMEGLUMINE 529 MG/ML IV SOLN COMPARISON:  09/09/2020. FINDINGS: Brain: Pituitary protocol was performed. Redemonstrated large there is GE the enhancing sellar/suprasellar mass which measures approximately 2.6 x 3.2 x 3.7 cm, not substantially changed in size. Unchanged cavernous sinus invasion and inferior extension along the clivus. Similar defect within the anterior aspect of the mass, likely related to prior debulking. Similar mass effect on the optic chiasm and pre chiasmatic optic nerves with extension anteriorly towards the right greater than left orbital apices without definite intraorbital extension. No acute infarct, he had hemorrhage, or hydrocephalus. Similar remote lacunar infarcts in bilateral cerebellar hemispheres and the left corona radiata. Similar dilated perivascular spaces and chronic microvascular ischemic disease.  Decided the size the mass described above, no other abnormal enhancement. Vascular: Major arterial flow voids are maintained at the skull base. Skull and upper cervical spine: Erosive change of the clivus from the mass described above, similar to prior. Otherwise, normal marrow signal. Sinuses/Orbits: Mild paranasal sinus mucosal thickening. Unremarkable orbits. Other: Small right mastoid effusion. IMPRESSION: 1. No substantial change in the large sellar/suprasellar mass with similar cavernous sinus invasion and mass effect on the optic chiasm/optic nerves, as detailed above. 2. Advanced chronic microvascular ischemic disease and remote lacunar infarcts. Electronically Signed   By: Margaretha Sheffield MD   On: 04/30/2021 11:33     Assessment/Plan PITUITARY ADENOMA  Loura Back is clinically stable today, with no recurrence of bitemporal hemianopsia on bedside exam.  She does describe mild subjective opthalmoplegia which is not appreciated on exam.    MRI demonstrates stability of residual adenoma, with no further encroachment into optic chiasm.  Will continue to follow with Dr. Loanne Drilling for diabetes and pituitary survey as needed.  We appreciate the opportunity to participate in the care of [name] White.   We spent twenty additional minutes teaching regarding the natural history, biology, and historical experience in the treatment of brain tumors. We then discussed in detail the current recommendations for therapy focusing on the mode of administration, mechanism of action, anticipated toxicities, and quality of life issues associated with this plan. We also provided teaching sheets for the patient to take home as an additional resource.  We ask that TRINNA KUNST return to clinic in 6 months following next brain MRI, or sooner as needed.  All questions were answered. The patient knows to call the clinic with any problems, questions or concerns. No barriers to learning were detected.  The  total time spent in the encounter was 45 minutes and more than 50% was on counseling and review of test results   Ventura Sellers, MD Medical Director of Neuro-Oncology [facility] at [facility]  05/04/21 10:12 AM

## 2021-06-05 ENCOUNTER — Other Ambulatory Visit: Payer: Self-pay | Admitting: Cardiology

## 2021-07-01 ENCOUNTER — Other Ambulatory Visit: Payer: Self-pay

## 2021-07-01 ENCOUNTER — Ambulatory Visit (INDEPENDENT_AMBULATORY_CARE_PROVIDER_SITE_OTHER): Payer: Medicare Other | Admitting: Endocrinology

## 2021-07-01 VITALS — BP 90/68 | HR 91 | Ht 65.0 in | Wt 153.0 lb

## 2021-07-01 DIAGNOSIS — D352 Benign neoplasm of pituitary gland: Secondary | ICD-10-CM

## 2021-07-01 DIAGNOSIS — E119 Type 2 diabetes mellitus without complications: Secondary | ICD-10-CM | POA: Diagnosis not present

## 2021-07-01 LAB — T4, FREE: Free T4: 0.6 ng/dL (ref 0.60–1.60)

## 2021-07-01 LAB — TSH: TSH: 1.3 u[IU]/mL (ref 0.35–5.50)

## 2021-07-01 LAB — POCT GLYCOSYLATED HEMOGLOBIN (HGB A1C): Hemoglobin A1C: 6.9 % — AB (ref 4.0–5.6)

## 2021-07-01 LAB — CORTISOL
Cortisol, Plasma: 6.3 ug/dL
Cortisol, Plasma: 24.3 ug/dL

## 2021-07-01 MED ORDER — COSYNTROPIN 0.25 MG IJ SOLR
0.2500 mg | Freq: Once | INTRAMUSCULAR | Status: AC
  Administered 2021-07-01: 0.25 mg via INTRAVENOUS

## 2021-07-01 NOTE — Patient Instructions (Addendum)
Please continue the same 4 diabetes medications. Blood tests are requested for you today.  We'll let you know about the results.  check your blood sugar once a day.  vary the time of day when you check, between before the 3 meals, and at bedtime.  also check if you have symptoms of your blood sugar being too high or too low.  please keep a record of the readings and bring it to your next appointment here (or you can bring the meter itself).  You can write it on any piece of paper.  please call us sooner if your blood sugar goes below 70, or if you have a lot of readings over 200.   Please come back for a follow-up appointment in 4 months.

## 2021-07-01 NOTE — Progress Notes (Signed)
Subjective:    Patient ID: [name] White, female    DOB: 1941-12-23, 80 y.o.   MRN: [id number]  HPI Pt returns for f/u of diabetes mellitus:  DM type: 2 Dx'ed: 1998/03/31, after an episode of pancreatitis (no pancreatitis since then).   Complications: PN Therapy: 3 oral meds + Victoza.  GDM: never DKA: never Severe hypoglycemia: never.    Pancreatic Imaging Mar 31, 2001): recurrent pancreatitis.  SDOH: husb died Mar 31, 2020.   Other: she has never taken insulin; she cannot take bromocriptine, due to an interaction with maxalt; pioglitizone has caused edema; fructosamine has converted to lower A1c than A1c itself; GLP med is used, as pancreatitis was many years ago.   Interval history: pt says cbg's vary from 98-175.    She had resection of nonsecretory pit tumor in 6/21; she is having XRT.  She has these pit function tests: TSH chronic primary hypothyroidism predated the above; on synthroid.   ACTH cortisol is normal Prolactin normal ASUB normal GH normal VP BMET and USG are normal.   FSH/LH: not menopausal--E2 is rx'ed by GYN.   Past Medical History:  Diagnosis Date   Anxiety    Asthma    ASYMPTOMATIC POSTMENOPAUSAL STATUS 02/09/2009   Cataract    Constipation 03/19/2013   Depression    DIABETES MELLITUS, TYPE II 09/14/2007   HYPERCHOLESTEROLEMIA 02/09/2009   HYPERTENSION 02/09/2009   HYPOTHYROIDISM 09/14/2007   MIGRAINE HEADACHE 09/14/2007   Neuropathy    OSTEOPOROSIS 02/09/2009   PANCREATITIS 09/14/2007   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   PONV (postoperative nausea and vomiting)    Rectocele 03/19/2013   Shortness of breath    SUPERFICIAL PHLEBITIS 09/14/2007   Varicose veins     Past Surgical History:  Procedure Laterality Date   ABDOMINAL HYSTERECTOMY     ANTERIOR AND POSTERIOR REPAIR N/A 04/02/2013   Procedure: ANTERIOR (CYSTOCELE) AND POSTERIOR REPAIR (RECTOCELE);  Surgeon: Jonnie Kind, MD;  Location: [facility];  Service: Gynecology;  Laterality: N/A;   APPENDECTOMY     BRAIN SURGERY      CATARACT EXTRACTION W/PHACO  12/05/2011   Procedure: CATARACT EXTRACTION PHACO AND INTRAOCULAR LENS PLACEMENT (IOC);  Surgeon: Tonny Branch;  Location: [facility];  Service: Ophthalmology;  Laterality: Left;  CDE:9.65   CHOLECYSTECTOMY     CRANIOTOMY N/A 06/04/2020   Procedure: Endoscopic Transphenoidal Resection of Recurrent PituitaryTumor;  Surgeon: Kristeen Miss, MD;  Location: [place];  Service: Neurosurgery;  Laterality: N/A;   CYSTOSCOPY     ENDOVENOUS ABLATION SAPHENOUS VEIN W/ LASER  11-03-2011   right greater saphenous vein   left leg done 10-2011   EYE SURGERY  98   right cataract extraction 98   LAPAROSCOPIC NISSEN FUNDOPLICATION     NM ESOPHAGEAL REFLUX  08-11-11   PITUITARY EXCISION  10/1997   POSTERIOR REPAIR     TRANSNASAL APPROACH N/A 06/04/2020   Procedure: TRANSNASAL APPROACH;  Surgeon: Jerrell Belfast, MD;  Location: [facility] [place];  Service: ENT;  Laterality: N/A;   TRANSPHENOIDAL / TRANSNASAL HYPOPHYSECTOMY / RESECTION PITUITARY TUMOR  08-11-11    Social History   Socioeconomic History   Marital status: Married    Spouse name: Not on file   Number of children: Not on file   Years of education: Not on file   Highest education level: Not on file  Occupational History   Occupation: Retired    Fish farm manager: RETIRED  Tobacco Use   Smoking status: Never   Smokeless tobacco: Never  Vaping Use   Vaping Use:  Never used  Substance and Sexual Activity   Alcohol use: No    Alcohol/week: 0.0 standard drinks   Drug use: No   Sexual activity: Not Currently    Birth control/protection: Surgical  Other Topics Concern   Not on file  Social History Narrative   Not on file   Social Determinants of Health   Financial Resource Strain: Not on file  Food Insecurity: Not on file  Transportation Needs: Not on file  Physical Activity: Not on file  Stress: Not on file  Social Connections: Not on file  Intimate Partner Violence: Not on file    Current Outpatient Medications on File  Prior to Visit  Medication Sig Dispense Refill   ACCU-CHEK AVIVA PLUS test strip TEST TWICE DAILY. 100 strip 0   albuterol (PROVENTIL HFA;VENTOLIN HFA) 108 (90 BASE) MCG/ACT inhaler Inhale 2 puffs into the lungs every 4 (four) hours as needed for wheezing or shortness of breath.     aspirin 81 MG tablet Take 81 mg by mouth daily.     atorvastatin (LIPITOR) 40 MG tablet Take 1 tablet (40 mg total) by mouth at bedtime. 90 tablet 3   baclofen (LIORESAL) 10 MG tablet Take 10 mg by mouth daily.     BREO ELLIPTA 200-25 MCG/INH AEPB Inhale 1 puff into the lungs daily.      carvedilol (COREG) 3.125 MG tablet TAKE ONE TABLET BY MOUTH TWICE A DAY 60 tablet 0   cycloSPORINE (RESTASIS) 0.05 % ophthalmic emulsion Place 1 drop into both eyes 2 (two) times daily.     dapagliflozin propanediol (FARXIGA) 5 MG TABS tablet Take 2.5 mg by mouth daily before breakfast. (Patient taking differently: Take 5 mg by mouth daily before breakfast.) 15 tablet 11   docusate sodium (COLACE) 100 MG capsule Take 1 capsule (100 mg total) by mouth 2 (two) times daily. 10 capsule 0   estradiol (ESTRACE) 1 MG tablet TAKE ONE TABLET (1MG  TOTAL) BY MOUTH DAILY (Patient taking differently: Take 1 mg by mouth daily.) 90 tablet 1   fluticasone (FLONASE) 50 MCG/ACT nasal spray Place 2 sprays into the nose at bedtime.     furosemide (LASIX) 20 MG tablet May take Lasix 20 mg as NEEDED for leg swelling 45 tablet 3   HYDROcodone-acetaminophen (NORCO/VICODIN) 5-325 MG tablet Take 1 tablet by mouth every 6 (six) hours as needed for moderate pain.      levothyroxine (SYNTHROID) 50 MCG tablet TAKE ONE TABLET (50MCG TOTAL) BY MOUTH DAILY BEFORE BREAKFAST 90 tablet 2   lisinopril (ZESTRIL) 10 MG tablet Take 10 mg by mouth daily.     metFORMIN (GLUCOPHAGE-XR) 500 MG 24 hr tablet TAKE TWO (2) TABLETS BY MOUTH DAILY 180 tablet 3   methocarbamol (ROBAXIN) 500 MG tablet Take 1 tablet (500 mg total) by mouth 2 (two) times daily. 20 tablet 0   Multiple  Vitamins-Minerals (MULTIVITAMINS THER. W/MINERALS) TABS Take 1 tablet by mouth every morning.     omeprazole (PRILOSEC) 20 MG capsule Take 20 mg by mouth every morning.     pregabalin (LYRICA) 150 MG capsule TAKE ONE CAPSULE (150 MG TOTAL) BY MOUTHTWO TIMES DAILY. 60 capsule 5   repaglinide (PRANDIN) 1 MG tablet TAKE ONE TABLET BY MOUTH WITH BREAKFAST,4 TABLETS WITH LUNCH, AND 4 TABLETS WITHSUPPER 810 tablet 3   rizatriptan (MAXALT-MLT) 10 MG disintegrating tablet Take 10 mg by mouth as needed for migraine. May repeat in 2 hours if needed     traMADol (ULTRAM) 50 MG tablet Take  1 tablet (50 mg total) by mouth every 6 (six) hours as needed. 10 tablet 0   UNIFINE PENTIPS 31G X 6 MM MISC USE TO INJECT VICTOZA ONCE DAILY 100 each 2   venlafaxine (EFFEXOR-XR) 150 MG 24 hr capsule Take 150 mg by mouth every morning.     Vibegron (GEMTESA) 75 MG TABS Take 75 mg by mouth at bedtime.     VICTOZA 18 MG/3ML SOPN INJECT 0.3MLS (1.8MG  TOTAL) INTO THE SKIN DAILY 27 mL 4   No current facility-administered medications on file prior to visit.    Allergies  Allergen Reactions   Betadine [Povidone Iodine] Hives   Penicillins Rash    Family History  Problem Relation Age of Onset   Heart disease Mother    Asthma Mother    COPD Father    Heart disease Father    Stroke Father    Asthma Daughter    Migraines Daughter    Neuropathy Son    Cancer Neg Hx    Anesthesia problems Neg Hx    Hypotension Neg Hx    Malignant hyperthermia Neg Hx    Pseudochol deficiency Neg Hx     BP 90/68 (BP Location: Left Arm, Patient Position: Sitting, Cuff Size: Normal)   Pulse 91   Ht 5\' 5"  (1.651 m)   Wt 153 lb (69.4 kg)   SpO2 96%   BMI 25.46 kg/m     Review of Systems She denies hypoglycemia    Objective:   Physical Exam Pulses: dorsalis pedis intact bilat.   MSK: no deformity of the feet CV: trace bilat leg edema Skin:  no ulcer on the feet.  normal color and temp on the feet. Neuro: sensation is  intact to touch on the feet.    Lab Results  Component Value Date   HGBA1C 6.9 (A) 07/01/2021   ACTH stimulation test is done: baseline cortisol level=6 then Cosyntropin 250 mcg is given im 45 minutes later, cortisol level=24 (normal response)    Assessment & Plan:  Type 2 DM: well-controlled Pituitary adenoma: HPA insuff is excluded.  I told pt no rx is needed for this.   Patient Instructions  Please continue the same 4 diabetes medications. Blood tests are requested for you today.  We'll let you know about the results.  check your blood sugar once a day.  vary the time of day when you check, between before the 3 meals, and at bedtime.  also check if you have symptoms of your blood sugar being too high or too low.  please keep a record of the readings and bring it to your next appointment here (or you can bring the meter itself).  You can write it on any piece of paper.  please call us sooner if your blood sugar goes below 70, or if you have a lot of readings over 200.   Please come White for a follow-up appointment in 4 months.

## 2021-07-05 LAB — ACTH: C206 ACTH: 9 pg/mL (ref 6–50)

## 2021-07-16 ENCOUNTER — Other Ambulatory Visit: Payer: Self-pay | Admitting: Cardiology

## 2021-07-30 DIAGNOSIS — I10 Essential (primary) hypertension: Secondary | ICD-10-CM | POA: Diagnosis not present

## 2021-07-30 DIAGNOSIS — E039 Hypothyroidism, unspecified: Secondary | ICD-10-CM | POA: Diagnosis not present

## 2021-07-30 DIAGNOSIS — E1165 Type 2 diabetes mellitus with hyperglycemia: Secondary | ICD-10-CM | POA: Diagnosis not present

## 2021-08-02 DIAGNOSIS — E1165 Type 2 diabetes mellitus with hyperglycemia: Secondary | ICD-10-CM | POA: Diagnosis not present

## 2021-08-02 DIAGNOSIS — R809 Proteinuria, unspecified: Secondary | ICD-10-CM | POA: Diagnosis not present

## 2021-08-02 DIAGNOSIS — R42 Dizziness and giddiness: Secondary | ICD-10-CM | POA: Diagnosis not present

## 2021-08-02 DIAGNOSIS — E039 Hypothyroidism, unspecified: Secondary | ICD-10-CM | POA: Diagnosis not present

## 2021-08-02 DIAGNOSIS — E782 Mixed hyperlipidemia: Secondary | ICD-10-CM | POA: Diagnosis not present

## 2021-08-02 DIAGNOSIS — J4540 Moderate persistent asthma, uncomplicated: Secondary | ICD-10-CM | POA: Diagnosis not present

## 2021-08-02 DIAGNOSIS — D352 Benign neoplasm of pituitary gland: Secondary | ICD-10-CM | POA: Diagnosis not present

## 2021-08-02 DIAGNOSIS — Z0001 Encounter for general adult medical examination with abnormal findings: Secondary | ICD-10-CM | POA: Diagnosis not present

## 2021-08-02 DIAGNOSIS — I10 Essential (primary) hypertension: Secondary | ICD-10-CM | POA: Diagnosis not present

## 2021-08-12 ENCOUNTER — Ambulatory Visit: Payer: Medicare Other | Admitting: Cardiology

## 2021-08-12 ENCOUNTER — Encounter: Payer: Self-pay | Admitting: Cardiology

## 2021-08-12 ENCOUNTER — Other Ambulatory Visit: Payer: Self-pay

## 2021-08-12 VITALS — BP 110/70 | HR 86 | Ht 65.0 in | Wt 154.2 lb

## 2021-08-12 DIAGNOSIS — R002 Palpitations: Secondary | ICD-10-CM

## 2021-08-12 MED ORDER — LISINOPRIL 5 MG PO TABS: 5.0000 mg | ORAL_TABLET | Freq: Every day | ORAL | 3 refills | Status: DC

## 2021-08-12 NOTE — Patient Instructions (Signed)
Medication Instructions:   DECREASE Lisinopril to 5 mg daily   *If you need a refill on your cardiac medications before your next appointment, please call your pharmacy*   Lab Work: None today  If you have labs (blood work) drawn today and your tests are completely normal, you will receive your results only by: [place] (if you have MyChart) OR A paper copy in the mail If you have any lab test that is abnormal or we need to change your treatment, we will call you to review the results.   Testing/Procedures: None today    Follow-Up: At [facility], you and your health needs are our priority.  As part of our continuing mission to provide you with exceptional heart care, we have created designated Provider Care Teams.  These Care Teams include your primary Cardiologist (physician) and Advanced Practice Providers (APPs -  Physician Assistants and Nurse Practitioners) who all work together to provide you with the care you need, when you need it.  We recommend signing up for the patient portal called "MyChart".  Sign up information is provided on this After Visit Summary.  MyChart is used to connect with patients for Virtual Visits (Telemedicine).  Patients are able to view lab/test results, encounter notes, upcoming appointments, etc.  Non-urgent messages can be sent to your provider as well.   To learn more about what you can do with MyChart, go to [email or web address].    Your next appointment:   1 year(s)  The format for your next appointment:   In Person  Provider:   Carlyle Dolly, MD   Other Instructions None

## 2021-08-12 NOTE — Progress Notes (Signed)
Clinical Summary [name] White is a 80 y.o.female seen today for follow up of the following medical problems.    1. Palpitations     - no recent palpitatons.   2. HTN -she remains compliant with med - some recent SBP in 90s at other doctors appts     3. Hyperlipidemia Jan 2019 TC 113 TG 116 HDL 42 LDL 48 - compliant with statin.    - most recent labs with pcp   4. LE edema - seen by vascular, evidence of bilateral venous reflux - 06/2015 [place] no DVT - echo 06/2014 LVEF 60-65%, grade II diastolic dysfunction.   - no recent edema. Takes lasix prn very infrequently.     5.Pituitary adenoma - s/p transphenoidal pituitary resection - undergoing radiation   - chronic stable. Taking lasix prn, doesn't require often  SH: husband passed Aug 06 2020 One daughter lives in town, daughter in [place], son in [place]      Past Medical History:  Diagnosis Date   Anxiety    Asthma    ASYMPTOMATIC POSTMENOPAUSAL STATUS 02/09/2009   Cataract    Constipation 03/19/2013   Depression    DIABETES MELLITUS, TYPE II 09/14/2007   HYPERCHOLESTEROLEMIA 02/09/2009   HYPERTENSION 02/09/2009   HYPOTHYROIDISM 09/14/2007   MIGRAINE HEADACHE 09/14/2007   Neuropathy    OSTEOPOROSIS 02/09/2009   PANCREATITIS 09/14/2007   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   PONV (postoperative nausea and vomiting)    Rectocele 03/19/2013   Shortness of breath    SUPERFICIAL PHLEBITIS 09/14/2007   Varicose veins      Allergies  Allergen Reactions   Betadine [Povidone Iodine] Hives   Penicillins Rash     Current Outpatient Medications  Medication Sig Dispense Refill   ACCU-CHEK AVIVA PLUS test strip TEST TWICE DAILY. 100 strip 0   albuterol (PROVENTIL HFA;VENTOLIN HFA) 108 (90 BASE) MCG/ACT inhaler Inhale 2 puffs into the lungs every 4 (four) hours as needed for wheezing or shortness of breath.     aspirin 81 MG tablet Take 81 mg by mouth daily.     atorvastatin (LIPITOR) 40 MG tablet Take 1 tablet (40 mg  total) by mouth at bedtime. 90 tablet 3   baclofen (LIORESAL) 10 MG tablet Take 10 mg by mouth daily.     BREO ELLIPTA 200-25 MCG/INH AEPB Inhale 1 puff into the lungs daily.      carvedilol (COREG) 3.125 MG tablet TAKE ONE TABLET BY MOUTH TWICE A DAY 30 tablet 0   cycloSPORINE (RESTASIS) 0.05 % ophthalmic emulsion Place 1 drop into both eyes 2 (two) times daily.     dapagliflozin propanediol (FARXIGA) 5 MG TABS tablet Take 2.5 mg by mouth daily before breakfast. (Patient taking differently: Take 5 mg by mouth daily before breakfast.) 15 tablet 11   docusate sodium (COLACE) 100 MG capsule Take 1 capsule (100 mg total) by mouth 2 (two) times daily. 10 capsule 0   estradiol (ESTRACE) 1 MG tablet TAKE ONE TABLET (1MG TOTAL) BY MOUTH DAILY (Patient taking differently: Take 1 mg by mouth daily.) 90 tablet 1   fluticasone (FLONASE) 50 MCG/ACT nasal spray Place 2 sprays into the nose at bedtime.     furosemide (LASIX) 20 MG tablet May take Lasix 20 mg as NEEDED for leg swelling 45 tablet 3   HYDROcodone-acetaminophen (NORCO/VICODIN) 5-325 MG tablet Take 1 tablet by mouth every 6 (six) hours as needed for moderate pain.      levothyroxine (SYNTHROID) 50 MCG tablet TAKE  ONE TABLET (50MCG TOTAL) BY MOUTH DAILY BEFORE BREAKFAST 90 tablet 2   lisinopril (ZESTRIL) 10 MG tablet Take 10 mg by mouth daily.     metFORMIN (GLUCOPHAGE-XR) 500 MG 24 hr tablet TAKE TWO (2) TABLETS BY MOUTH DAILY 180 tablet 3   methocarbamol (ROBAXIN) 500 MG tablet Take 1 tablet (500 mg total) by mouth 2 (two) times daily. 20 tablet 0   Multiple Vitamins-Minerals (MULTIVITAMINS THER. W/MINERALS) TABS Take 1 tablet by mouth every morning.     omeprazole (PRILOSEC) 20 MG capsule Take 20 mg by mouth every morning.     pregabalin (LYRICA) 150 MG capsule TAKE ONE CAPSULE (150 MG TOTAL) BY MOUTHTWO TIMES DAILY. 60 capsule 5   repaglinide (PRANDIN) 1 MG tablet TAKE ONE TABLET BY MOUTH WITH BREAKFAST,4 TABLETS WITH LUNCH, AND 4 TABLETS  WITHSUPPER 810 tablet 3   rizatriptan (MAXALT-MLT) 10 MG disintegrating tablet Take 10 mg by mouth as needed for migraine. May repeat in 2 hours if needed     traMADol (ULTRAM) 50 MG tablet Take 1 tablet (50 mg total) by mouth every 6 (six) hours as needed. 10 tablet 0   UNIFINE PENTIPS 31G X 6 MM MISC USE TO INJECT VICTOZA ONCE DAILY 100 each 2   venlafaxine (EFFEXOR-XR) 150 MG 24 hr capsule Take 150 mg by mouth every morning.     Vibegron (GEMTESA) 75 MG TABS Take 75 mg by mouth at bedtime.     VICTOZA 18 MG/3ML SOPN INJECT 0.3MLS (1.8MG TOTAL) INTO THE SKIN DAILY 27 mL 4   No current facility-administered medications for this visit.     Past Surgical History:  Procedure Laterality Date   ABDOMINAL HYSTERECTOMY     ANTERIOR AND POSTERIOR REPAIR N/A 04/02/2013   Procedure: ANTERIOR (CYSTOCELE) AND POSTERIOR REPAIR (RECTOCELE);  Surgeon: Jonnie Kind, MD;  Location: [facility];  Service: Gynecology;  Laterality: N/A;   APPENDECTOMY     BRAIN SURGERY     CATARACT EXTRACTION W/PHACO  12/05/2011   Procedure: CATARACT EXTRACTION PHACO AND INTRAOCULAR LENS PLACEMENT (IOC);  Surgeon: Tonny Celsey Asselin;  Location: [facility];  Service: Ophthalmology;  Laterality: Left;  CDE:9.65   CHOLECYSTECTOMY     CRANIOTOMY N/A 06/04/2020   Procedure: Endoscopic Transphenoidal Resection of Recurrent PituitaryTumor;  Surgeon: Kristeen Miss, MD;  Location: [place] [facility];  Service: Neurosurgery;  Laterality: N/A;   CYSTOSCOPY     ENDOVENOUS ABLATION SAPHENOUS VEIN W/ LASER  11-03-2011   right greater saphenous vein   left leg done 10-2011   EYE SURGERY  98   right cataract extraction 98   LAPAROSCOPIC NISSEN FUNDOPLICATION     NM ESOPHAGEAL REFLUX  08-11-11   PITUITARY EXCISION  10/1997   POSTERIOR REPAIR     TRANSNASAL APPROACH N/A 06/04/2020   Procedure: TRANSNASAL APPROACH;  Surgeon: Jerrell Belfast, MD;  Location: [place];  Service: ENT;  Laterality: N/A;   TRANSPHENOIDAL / TRANSNASAL HYPOPHYSECTOMY / RESECTION PITUITARY  TUMOR  08-11-11     Allergies  Allergen Reactions   Betadine [Povidone Iodine] Hives   Penicillins Rash      Family History  Problem Relation Age of Onset   Heart disease Mother    Asthma Mother    COPD Father    Heart disease Father    Stroke Father    Asthma Daughter    Migraines Daughter    Neuropathy Son    Cancer Neg Hx    Anesthesia problems Neg Hx    Hypotension Neg Hx  Malignant hyperthermia Neg Hx    Pseudochol deficiency Neg Hx      Social History Ms. Lyne reports that she has never smoked. She has never used smokeless tobacco. Ms. Screws reports no history of alcohol use.   Review of Systems CONSTITUTIONAL: No weight loss, fever, chills, weakness or fatigue.  HEENT: Eyes: No visual loss, blurred vision, double vision or yellow sclerae.No hearing loss, sneezing, congestion, runny nose or sore throat.  SKIN: No rash or itching.  CARDIOVASCULAR: per hpi RESPIRATORY: No shortness of breath, cough or sputum.  GASTROINTESTINAL: No anorexia, nausea, vomiting or diarrhea. No abdominal pain or blood.  GENITOURINARY: No burning on urination, no polyuria NEUROLOGICAL: No headache, dizziness, syncope, paralysis, ataxia, numbness or tingling in the extremities. No change in bowel or bladder control.  MUSCULOSKELETAL: No muscle, back pain, joint pain or stiffness.  LYMPHATICS: No enlarged nodes. No history of splenectomy.  PSYCHIATRIC: No history of depression or anxiety.  ENDOCRINOLOGIC: No reports of sweating, cold or heat intolerance. No polyuria or polydipsia.  Marland Kitchen   Physical Examination Today's Vitals   08/12/21 1139  BP: 110/70  Pulse: 86  SpO2: 95%  Weight: 154 lb 3.2 oz (69.9 kg)  Height: _0  (1.651 m)   Body mass index is 25.66 kg/m.  Gen: resting comfortably, no acute distress HEENT: no scleral icterus, pupils equal round and reactive, no palptable cervical adenopathy,  CV: RRR, no m/r/g no jvd Resp: Clear to auscultation bilaterally GI:  abdomen is soft, non-tender, non-distended, normal bowel sounds, no hepatosplenomegaly MSK: extremities are warm, no edema.  Skin: warm, no rash Neuro:  no focal deficits Psych: appropriate affect   Diagnostic Studies  06/2014 Echo Study Conclusions  - Left ventricle: The cavity size was normal. Wall thickness was increased in a pattern of mild LVH. Systolic function was normal. The estimated ejection fraction was in the range of 60% to 65%. Wall motion was normal; there were no regional wall motion abnormalities. Features are consistent with a pseudonormal left ventricular filling pattern, with concomitant abnormal relaxation and increased filling pressure (grade 2 diastolic dysfunction). - Aortic valve: There was trivial regurgitation. - Mitral valve: Calcified annulus. There was mild regurgitation. - Left atrium: The atrium was moderately dilated. - Right atrium: Central venous pressure (est): 3 mm Hg. - Tricuspid valve: There was trivial regurgitation. - Pulmonary arteries: Systolic pressure could not be accurately estimated. - Pericardium, extracardiac: There was no pericardial effusion.  Impressions:  - Mild LVH with LVEF 53-20%, grade 2 diastolic dysfunction. Moderate left atrial enlargement. MAC with mild mitral regurgitation. Unable to assess PASP.       Assessment and Plan   1. Palpitations - no symptoms, cotinue coreg - EKG today NSR   2. HTN - low bp's at times, lower lisinopril to 36m daily   3. Hyperlipidemia - continue statin, request pcp labs   4. LE edema -no recent issues, has prn lasix but has not needed in some time - continue prn lasix  F/u 1 year     JArnoldo Lenis M.D.

## 2021-08-14 ENCOUNTER — Other Ambulatory Visit: Payer: Self-pay | Admitting: Cardiology

## 2021-09-04 ENCOUNTER — Other Ambulatory Visit: Payer: Self-pay | Admitting: Endocrinology

## 2021-09-06 ENCOUNTER — Telehealth: Payer: Self-pay | Admitting: Pharmacy Technician

## 2021-09-06 NOTE — Telephone Encounter (Signed)
Patient Advocate Encounter   Received notification from [place] [employer] that prior authorization for VICTOZA is required.   PA submitted on 09/06/2021 Key [id number] Status is pending    [facility] will continue to follow   Ronney Asters, CPhT Patient Advocate [facility] Phone: [phone] Fax:  [phone]

## 2021-09-07 ENCOUNTER — Telehealth: Payer: Self-pay | Admitting: Pharmacy Technician

## 2021-09-07 NOTE — Telephone Encounter (Signed)
Received notification from [place] a prior authorization for [place]. Prior Authorization not needed due to quantity allowed being 43m/30days.

## 2021-09-10 ENCOUNTER — Other Ambulatory Visit: Payer: Self-pay | Admitting: Cardiology

## 2021-09-23 NOTE — Telephone Encounter (Signed)
ERROR

## 2021-10-07 ENCOUNTER — Other Ambulatory Visit: Payer: Self-pay | Admitting: *Deleted

## 2021-10-07 DIAGNOSIS — D352 Benign neoplasm of pituitary gland: Secondary | ICD-10-CM

## 2021-10-21 ENCOUNTER — Other Ambulatory Visit: Payer: Self-pay | Admitting: Radiation Therapy

## 2021-10-29 ENCOUNTER — Ambulatory Visit
Admission: RE | Admit: 2021-10-29 | Discharge: 2021-10-29 | Disposition: A | Discharge status: Home / Self Care | Payer: Medicare Other | Source: Ambulatory Visit | Attending: Internal Medicine | Admitting: Internal Medicine

## 2021-10-29 DIAGNOSIS — D353 Benign neoplasm of craniopharyngeal duct: Secondary | ICD-10-CM

## 2021-10-29 DIAGNOSIS — D352 Benign neoplasm of pituitary gland: Secondary | ICD-10-CM

## 2021-10-29 DIAGNOSIS — C719 Malignant neoplasm of brain, unspecified: Secondary | ICD-10-CM | POA: Diagnosis not present

## 2021-10-29 MED ORDER — GADOBENATE DIMEGLUMINE 529 MG/ML IV SOLN
14.0000 mL | Freq: Once | INTRAVENOUS | Status: AC | PRN
  Administered 2021-10-29: 7 mL via INTRAVENOUS

## 2021-11-01 ENCOUNTER — Inpatient Hospital Stay: Payer: Medicare Other | Attending: Internal Medicine

## 2021-11-01 DIAGNOSIS — Z7951 Long term (current) use of inhaled steroids: Secondary | ICD-10-CM | POA: Insufficient documentation

## 2021-11-01 DIAGNOSIS — I10 Essential (primary) hypertension: Secondary | ICD-10-CM | POA: Insufficient documentation

## 2021-11-01 DIAGNOSIS — Z79899 Other long term (current) drug therapy: Secondary | ICD-10-CM | POA: Insufficient documentation

## 2021-11-01 DIAGNOSIS — E039 Hypothyroidism, unspecified: Secondary | ICD-10-CM | POA: Insufficient documentation

## 2021-11-01 DIAGNOSIS — Z79621 Long term (current) use of calcineurin inhibitor: Secondary | ICD-10-CM | POA: Insufficient documentation

## 2021-11-01 DIAGNOSIS — Z7985 Long-term (current) use of injectable non-insulin antidiabetic drugs: Secondary | ICD-10-CM | POA: Insufficient documentation

## 2021-11-01 DIAGNOSIS — E119 Type 2 diabetes mellitus without complications: Secondary | ICD-10-CM | POA: Insufficient documentation

## 2021-11-01 DIAGNOSIS — Z7984 Long term (current) use of oral hypoglycemic drugs: Secondary | ICD-10-CM | POA: Insufficient documentation

## 2021-11-01 DIAGNOSIS — Z7982 Long term (current) use of aspirin: Secondary | ICD-10-CM | POA: Insufficient documentation

## 2021-11-01 DIAGNOSIS — D352 Benign neoplasm of pituitary gland: Secondary | ICD-10-CM | POA: Insufficient documentation

## 2021-11-04 ENCOUNTER — Ambulatory Visit: Payer: Medicare Other | Admitting: Endocrinology

## 2021-11-04 ENCOUNTER — Other Ambulatory Visit: Payer: Self-pay

## 2021-11-04 ENCOUNTER — Inpatient Hospital Stay: Payer: Medicare Other | Admitting: Internal Medicine

## 2021-11-04 ENCOUNTER — Telehealth: Payer: Self-pay | Admitting: Internal Medicine

## 2021-11-04 VITALS — BP 120/70 | HR 96 | Ht 65.0 in | Wt 154.6 lb

## 2021-11-04 VITALS — BP 108/66 | HR 65 | Temp 97.3°F | Resp 18 | Wt 154.5 lb

## 2021-11-04 DIAGNOSIS — E039 Hypothyroidism, unspecified: Secondary | ICD-10-CM | POA: Diagnosis not present

## 2021-11-04 DIAGNOSIS — Z7984 Long term (current) use of oral hypoglycemic drugs: Secondary | ICD-10-CM | POA: Diagnosis not present

## 2021-11-04 DIAGNOSIS — Z7982 Long term (current) use of aspirin: Secondary | ICD-10-CM | POA: Diagnosis not present

## 2021-11-04 DIAGNOSIS — Z7951 Long term (current) use of inhaled steroids: Secondary | ICD-10-CM | POA: Diagnosis not present

## 2021-11-04 DIAGNOSIS — D353 Benign neoplasm of craniopharyngeal duct: Secondary | ICD-10-CM | POA: Diagnosis not present

## 2021-11-04 DIAGNOSIS — E119 Type 2 diabetes mellitus without complications: Secondary | ICD-10-CM | POA: Diagnosis not present

## 2021-11-04 DIAGNOSIS — Z79899 Other long term (current) drug therapy: Secondary | ICD-10-CM | POA: Diagnosis not present

## 2021-11-04 DIAGNOSIS — D352 Benign neoplasm of pituitary gland: Secondary | ICD-10-CM | POA: Diagnosis not present

## 2021-11-04 DIAGNOSIS — I10 Essential (primary) hypertension: Secondary | ICD-10-CM | POA: Diagnosis not present

## 2021-11-04 DIAGNOSIS — Z7985 Long-term (current) use of injectable non-insulin antidiabetic drugs: Secondary | ICD-10-CM | POA: Diagnosis not present

## 2021-11-04 DIAGNOSIS — Z79621 Long term (current) use of calcineurin inhibitor: Secondary | ICD-10-CM | POA: Diagnosis not present

## 2021-11-04 LAB — POCT GLYCOSYLATED HEMOGLOBIN (HGB A1C): Hemoglobin A1C: 7.3 % — AB (ref 4.0–5.6)

## 2021-11-04 NOTE — Telephone Encounter (Signed)
Scheduled per 11/10 los, message has been left with pt

## 2021-11-04 NOTE — Progress Notes (Signed)
Subjective:    Patient ID: [name] White, female    DOB: 1941/04/24, 80 y.o.   MRN: [id number]  HPI Pt returns for f/u of diabetes mellitus:   DM type: 2 Dx'ed: 23-Mar-1998, after an episode of pancreatitis (no pancreatitis since then).   Complications: PN Therapy: 3 oral meds + Victoza.  GDM: never DKA: never Severe hypoglycemia: never.    Pancreatic Imaging 03-23-2001): recurrent pancreatitis.  SDOH: husb died 23-Mar-2020.   Other: she has never taken insulin; she cannot take bromocriptine, due to an interaction with maxalt; pioglitizone has caused edema; fructosamine has converted to lower A1c than A1c itself; GLP med is used, as pancreatitis was many years ago.   Interval history: pt says cbg's vary from 70-160.  pt states she feels well in general.    She had resection of nonsecretory pit tumor in 6/21; she then had XRT. F/u MRI 03/23/2021) was unchanged.  She has these pit function tests:   TSH chronic primary hypothyroidism predated the above; on synthroid.   ACTH cortisol is normal Prolactin normal ASUB normal GH normal VP BMET and USG are normal.   FSH/LH: not menopausal--E2 is rx'ed by GYN.   Past Medical History:  Diagnosis Date   Anxiety    Asthma    ASYMPTOMATIC POSTMENOPAUSAL STATUS 02/09/2009   Cataract    Constipation 03/19/2013   Depression    DIABETES MELLITUS, TYPE II 09/14/2007   HYPERCHOLESTEROLEMIA 02/09/2009   HYPERTENSION 02/09/2009   HYPOTHYROIDISM 09/14/2007   MIGRAINE HEADACHE 09/14/2007   Neuropathy    OSTEOPOROSIS 02/09/2009   PANCREATITIS 09/14/2007   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   PONV (postoperative nausea and vomiting)    Rectocele 03/19/2013   Shortness of breath    SUPERFICIAL PHLEBITIS 09/14/2007   Varicose veins     Past Surgical History:  Procedure Laterality Date   ABDOMINAL HYSTERECTOMY     ANTERIOR AND POSTERIOR REPAIR N/A 04/02/2013   Procedure: ANTERIOR (CYSTOCELE) AND POSTERIOR REPAIR (RECTOCELE);  Surgeon: Jonnie Kind, MD;  Location: [facility];   Service: Gynecology;  Laterality: N/A;   APPENDECTOMY     BRAIN SURGERY     CATARACT EXTRACTION W/PHACO  12/05/2011   Procedure: CATARACT EXTRACTION PHACO AND INTRAOCULAR LENS PLACEMENT (IOC);  Surgeon: Tonny Branch;  Location: [facility];  Service: Ophthalmology;  Laterality: Left;  CDE:9.65   CHOLECYSTECTOMY     CRANIOTOMY N/A 06/04/2020   Procedure: Endoscopic Transphenoidal Resection of Recurrent PituitaryTumor;  Surgeon: Kristeen Miss, MD;  Location: [facility];  Service: Neurosurgery;  Laterality: N/A;   CYSTOSCOPY     ENDOVENOUS ABLATION SAPHENOUS VEIN W/ LASER  11-03-2011   right greater saphenous vein   left leg done 10-2011   EYE SURGERY  98   right cataract extraction 98   LAPAROSCOPIC NISSEN FUNDOPLICATION     NM ESOPHAGEAL REFLUX  08-11-11   PITUITARY EXCISION  10/1997   POSTERIOR REPAIR     TRANSNASAL APPROACH N/A 06/04/2020   Procedure: TRANSNASAL APPROACH;  Surgeon: Jerrell Belfast, MD;  Location: [facility];  Service: ENT;  Laterality: N/A;   TRANSPHENOIDAL / TRANSNASAL HYPOPHYSECTOMY / RESECTION PITUITARY TUMOR  08-11-11    Social History   Socioeconomic History   Marital status: Married    Spouse name: Not on file   Number of children: Not on file   Years of education: Not on file   Highest education level: Not on file  Occupational History   Occupation: Retired    Fish farm manager: RETIRED  Tobacco Use  Smoking status: Never   Smokeless tobacco: Never  Vaping Use   Vaping Use: Never used  Substance and Sexual Activity   Alcohol use: No    Alcohol/week: 0.0 standard drinks   Drug use: No   Sexual activity: Not Currently    Birth control/protection: Surgical  Other Topics Concern   Not on file  Social History Narrative   Not on file   Social Determinants of Health   Financial Resource Strain: Not on file  Food Insecurity: Not on file  Transportation Needs: Not on file  Physical Activity: Not on file  Stress: Not on file  Social Connections: Not on file  Intimate  Partner Violence: Not on file    Current Outpatient Medications on File Prior to Visit  Medication Sig Dispense Refill   ACCU-CHEK AVIVA PLUS test strip TEST TWICE DAILY. 100 strip 0   albuterol (PROVENTIL HFA;VENTOLIN HFA) 108 (90 BASE) MCG/ACT inhaler Inhale 2 puffs into the lungs every 4 (four) hours as needed for wheezing or shortness of breath.     aspirin 81 MG tablet Take 81 mg by mouth daily.     atorvastatin (LIPITOR) 40 MG tablet TAKE ONE (1) TABLET BY MOUTH EVERY DAY 90 tablet 3   baclofen (LIORESAL) 10 MG tablet Take 10 mg by mouth daily.     BREO ELLIPTA 200-25 MCG/INH AEPB Inhale 1 puff into the lungs daily.      carvedilol (COREG) 3.125 MG tablet TAKE ONE TABLET BY MOUTH TWICE A DAY 180 tablet 3   cycloSPORINE (RESTASIS) 0.05 % ophthalmic emulsion Place 1 drop into both eyes 2 (two) times daily.     dapagliflozin propanediol (FARXIGA) 5 MG TABS tablet Take 2.5 mg by mouth daily before breakfast. (Patient taking differently: Take 5 mg by mouth daily before breakfast.) 15 tablet 11   docusate sodium (COLACE) 100 MG capsule Take 1 capsule (100 mg total) by mouth 2 (two) times daily. 10 capsule 0   estradiol (ESTRACE) 1 MG tablet TAKE ONE TABLET (1MG  TOTAL) BY MOUTH DAILY (Patient taking differently: Take 1 mg by mouth daily.) 90 tablet 1   furosemide (LASIX) 20 MG tablet May take Lasix 20 mg as NEEDED for leg swelling 45 tablet 3   HYDROcodone-acetaminophen (NORCO/VICODIN) 5-325 MG tablet Take 1 tablet by mouth every 6 (six) hours as needed for moderate pain.      levothyroxine (SYNTHROID) 50 MCG tablet TAKE ONE TABLET (50MCG TOTAL) BY MOUTH DAILY BEFORE BREAKFAST 90 tablet 2   lisinopril (ZESTRIL) 5 MG tablet Take 1 tablet (5 mg total) by mouth daily. 90 tablet 3   metFORMIN (GLUCOPHAGE-XR) 500 MG 24 hr tablet TAKE TWO (2) TABLETS BY MOUTH DAILY 180 tablet 3   methocarbamol (ROBAXIN) 500 MG tablet Take 1 tablet (500 mg total) by mouth 2 (two) times daily. 20 tablet 0   Multiple  Vitamins-Minerals (MULTIVITAMINS THER. W/MINERALS) TABS Take 1 tablet by mouth every morning.     omeprazole (PRILOSEC) 20 MG capsule Take 20 mg by mouth every morning.     pregabalin (LYRICA) 150 MG capsule TAKE ONE CAPSULE (150 MG TOTAL) BY MOUTHTWO TIMES DAILY. 60 capsule 5   repaglinide (PRANDIN) 1 MG tablet TAKE ONE TABLET BY MOUTH WITH BREAKFAST,4 TABLETS WITH LUNCH, AND 4 TABLETS WITHSUPPER 810 tablet 3   rizatriptan (MAXALT-MLT) 10 MG disintegrating tablet Take 10 mg by mouth as needed for migraine. May repeat in 2 hours if needed     traMADol (ULTRAM) 50 MG tablet Take 1  tablet (50 mg total) by mouth every 6 (six) hours as needed. 10 tablet 0   UNIFINE PENTIPS 31G X 6 MM MISC USE TO INJECT VICTOZA ONCE DAILY 100 each 2   venlafaxine (EFFEXOR-XR) 150 MG 24 hr capsule Take 150 mg by mouth every morning.     Vibegron (GEMTESA) 75 MG TABS Take 75 mg by mouth at bedtime.     VICTOZA 18 MG/3ML SOPN INJECT 0.3MLS (1.8MG  TOTAL) INTO THE SKIN DAILY 27 mL 2   fluticasone (FLONASE) 50 MCG/ACT nasal spray Place 2 sprays into the nose at bedtime.     No current facility-administered medications on file prior to visit.    Allergies  Allergen Reactions   Betadine [Povidone Iodine] Hives   Penicillins Rash    Family History  Problem Relation Age of Onset   Heart disease Mother    Asthma Mother    COPD Father    Heart disease Father    Stroke Father    Asthma Daughter    Migraines Daughter    Neuropathy Son    Cancer Neg Hx    Anesthesia problems Neg Hx    Hypotension Neg Hx    Malignant hyperthermia Neg Hx    Pseudochol deficiency Neg Hx     BP 120/70 (BP Location: Right Arm, Patient Position: Sitting, Cuff Size: Normal)   Pulse 96   Ht 5\' 5"  (1.651 m)   Wt 154 lb 9.6 oz (70.1 kg)   SpO2 93%   BMI 25.73 kg/m    Review of Systems She denies hypoglycemia.      Objective:   Physical Exam NECK: There is no palpable thyroid enlargement.  No thyroid nodule is palpable.  No  palpable lymphadenopathy at the anterior neck.   Lab Results  Component Value Date   HGBA1C 7.3 (A) 11/04/2021      Assessment & Plan:  Type 2 DM: well-controlled.    Patient Instructions  Please continue the same 4 diabetes medications. Blood tests are requested for you today.  We'll let you know about the results.  check your blood sugar once a day.  vary the time of day when you check, between before the 3 meals, and at bedtime.  also check if you have symptoms of your blood sugar being too high or too low.  please keep a record of the readings and bring it to your next appointment here (or you can bring the meter itself).  You can write it on any piece of paper.  please call us sooner if your blood sugar goes below 70, or if you have a lot of readings over 200.   Please come White for a follow-up appointment in 4 months.

## 2021-11-04 NOTE — Progress Notes (Signed)
[facility] [place], [place] [address] [phone]   Interval Evaluation  Date of Service: 11/04/21 Patient Name: [name] White Patient MRN: [id number] Patient DOB: 1941-03-04 Provider: Ventura Sellers, MD  Identifying Statement:  [name] is a 80 y.o. female with  pituitary   adenoma    Oncologic History: Xx/xx/99Johney White total resection  06/04/20: Debulking transphenoidal resection of recurrent adenoma with Dr. Ellene Route and Dr. Zada Finders 10/30/20: Completes 6 weeks IMRT to residual adenoma with Dr. Isidore Moos   Interval History: [name] White presents today for follow up after recent MRI brain.  She otherwise denies any new or progressive neurologic deficits.  No headaches or seizures.  Maintains functional independence.  Still living with her daughter and small dog.  H+P (05/04/21) Patient presents to clinic today following her 3 months post-IMRT MRI scan.  She describes no recurrence in "tunnel vision" symptoms which had affected her for the 6 months prior to her fall and repeat surgery.  She does experience some double vision, with objects "on top of each other" worse when looking to the right.  This is also not a new complaint.  Otherwise, she complains of "blurriness" that impairs her ability to read at times, not in one particular eye or visual field.  Is followed by ophthalmology.  Denies non-visual neurologic symptoms.  Lives at home with her daughter, is functional and independent.  Medications: Current Outpatient Medications on File Prior to Visit  Medication Sig Dispense Refill   VICTOZA 18 MG/3ML SOPN INJECT 0.3MLS (1.8MG  TOTAL) INTO THE SKIN DAILY 27 mL 2   ACCU-CHEK AVIVA PLUS test strip TEST TWICE DAILY. 100 strip 0   albuterol (PROVENTIL HFA;VENTOLIN HFA) 108 (90 BASE) MCG/ACT inhaler Inhale 2 puffs into the lungs every 4 (four) hours as needed for wheezing or shortness of breath.     aspirin 81 MG tablet Take 81  mg by mouth daily.     atorvastatin (LIPITOR) 40 MG tablet TAKE ONE (1) TABLET BY MOUTH EVERY DAY 90 tablet 3   baclofen (LIORESAL) 10 MG tablet Take 10 mg by mouth daily.     BREO ELLIPTA 200-25 MCG/INH AEPB Inhale 1 puff into the lungs daily.      carvedilol (COREG) 3.125 MG tablet TAKE ONE TABLET BY MOUTH TWICE A DAY 180 tablet 3   cycloSPORINE (RESTASIS) 0.05 % ophthalmic emulsion Place 1 drop into both eyes 2 (two) times daily.     dapagliflozin propanediol (FARXIGA) 5 MG TABS tablet Take 2.5 mg by mouth daily before breakfast. (Patient taking differently: Take 5 mg by mouth daily before breakfast.) 15 tablet 11   docusate sodium (COLACE) 100 MG capsule Take 1 capsule (100 mg total) by mouth 2 (two) times daily. 10 capsule 0   estradiol (ESTRACE) 1 MG tablet TAKE ONE TABLET (1MG  TOTAL) BY MOUTH DAILY (Patient taking differently: Take 1 mg by mouth daily.) 90 tablet 1   fluticasone (FLONASE) 50 MCG/ACT nasal spray Place 2 sprays into the nose at bedtime.     furosemide (LASIX) 20 MG tablet May take Lasix 20 mg as NEEDED for leg swelling 45 tablet 3   HYDROcodone-acetaminophen (NORCO/VICODIN) 5-325 MG tablet Take 1 tablet by mouth every 6 (six) hours as needed for moderate pain.      levothyroxine (SYNTHROID) 50 MCG tablet TAKE ONE TABLET (50MCG TOTAL) BY MOUTH DAILY BEFORE BREAKFAST 90 tablet 2   lisinopril (ZESTRIL) 5 MG tablet Take 1  tablet (5 mg total) by mouth daily. 90 tablet 3   metFORMIN (GLUCOPHAGE-XR) 500 MG 24 hr tablet TAKE TWO (2) TABLETS BY MOUTH DAILY 180 tablet 3   methocarbamol (ROBAXIN) 500 MG tablet Take 1 tablet (500 mg total) by mouth 2 (two) times daily. 20 tablet 0   Multiple Vitamins-Minerals (MULTIVITAMINS THER. W/MINERALS) TABS Take 1 tablet by mouth every morning.     omeprazole (PRILOSEC) 20 MG capsule Take 20 mg by mouth every morning.     pregabalin (LYRICA) 150 MG capsule TAKE ONE CAPSULE (150 MG TOTAL) BY MOUTHTWO TIMES DAILY. 60 capsule 5   repaglinide (PRANDIN)  1 MG tablet TAKE ONE TABLET BY MOUTH WITH BREAKFAST,4 TABLETS WITH LUNCH, AND 4 TABLETS WITHSUPPER 810 tablet 3   rizatriptan (MAXALT-MLT) 10 MG disintegrating tablet Take 10 mg by mouth as needed for migraine. May repeat in 2 hours if needed     traMADol (ULTRAM) 50 MG tablet Take 1 tablet (50 mg total) by mouth every 6 (six) hours as needed. 10 tablet 0   UNIFINE PENTIPS 31G X 6 MM MISC USE TO INJECT VICTOZA ONCE DAILY 100 each 2   venlafaxine (EFFEXOR-XR) 150 MG 24 hr capsule Take 150 mg by mouth every morning.     Vibegron (GEMTESA) 75 MG TABS Take 75 mg by mouth at bedtime.     No current facility-administered medications on file prior to visit.    Allergies:  Allergies  Allergen Reactions   Betadine [Povidone Iodine] Hives   Penicillins Rash   Past Medical History:  Past Medical History:  Diagnosis Date   Anxiety    Asthma    ASYMPTOMATIC POSTMENOPAUSAL STATUS 02/09/2009   Cataract    Constipation 03/19/2013   Depression    DIABETES MELLITUS, TYPE II 09/14/2007   HYPERCHOLESTEROLEMIA 02/09/2009   HYPERTENSION 02/09/2009   HYPOTHYROIDISM 09/14/2007   MIGRAINE HEADACHE 09/14/2007   Neuropathy    OSTEOPOROSIS 02/09/2009   PANCREATITIS 09/14/2007   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   PONV (postoperative nausea and vomiting)    Rectocele 03/19/2013   Shortness of breath    SUPERFICIAL PHLEBITIS 09/14/2007   Varicose veins    Past Surgical History:  Past Surgical History:  Procedure Laterality Date   ABDOMINAL HYSTERECTOMY     ANTERIOR AND POSTERIOR REPAIR N/A 04/02/2013   Procedure: ANTERIOR (CYSTOCELE) AND POSTERIOR REPAIR (RECTOCELE);  Surgeon: Jonnie Kind, MD;  Location: [facility];  Service: Gynecology;  Laterality: N/A;   APPENDECTOMY     BRAIN SURGERY     CATARACT EXTRACTION W/PHACO  12/05/2011   Procedure: CATARACT EXTRACTION PHACO AND INTRAOCULAR LENS PLACEMENT (IOC);  Surgeon: Tonny Branch;  Location: [facility];  Service: Ophthalmology;  Laterality: Left;  CDE:9.65    CHOLECYSTECTOMY     CRANIOTOMY N/A 06/04/2020   Procedure: Endoscopic Transphenoidal Resection of Recurrent PituitaryTumor;  Surgeon: Kristeen Miss, MD;  Location: [facility];  Service: Neurosurgery;  Laterality: N/A;   CYSTOSCOPY     ENDOVENOUS ABLATION SAPHENOUS VEIN W/ LASER  11-03-2011   right greater saphenous vein   left leg done 10-2011   EYE SURGERY  98   right cataract extraction 98   LAPAROSCOPIC NISSEN FUNDOPLICATION     NM ESOPHAGEAL REFLUX  08-11-11   PITUITARY EXCISION  10/1997   POSTERIOR REPAIR     TRANSNASAL APPROACH N/A 06/04/2020   Procedure: TRANSNASAL APPROACH;  Surgeon: Jerrell Belfast, MD;  Location: [facility];  Service: ENT;  Laterality: N/A;   TRANSPHENOIDAL / TRANSNASAL HYPOPHYSECTOMY / RESECTION PITUITARY  TUMOR  08-11-11   Social History:  Social History   Socioeconomic History   Marital status: Married    Spouse name: Not on file   Number of children: Not on file   Years of education: Not on file   Highest education level: Not on file  Occupational History   Occupation: Retired    Fish farm manager: RETIRED  Tobacco Use   Smoking status: Never   Smokeless tobacco: Never  Vaping Use   Vaping Use: Never used  Substance and Sexual Activity   Alcohol use: No    Alcohol/week: 0.0 standard drinks   Drug use: No   Sexual activity: Not Currently    Birth control/protection: Surgical  Other Topics Concern   Not on file  Social History Narrative   Not on file   Social Determinants of Health   Financial Resource Strain: Not on file  Food Insecurity: Not on file  Transportation Needs: Not on file  Physical Activity: Not on file  Stress: Not on file  Social Connections: Not on file  Intimate Partner Violence: Not on file   Family History:  Family History  Problem Relation Age of Onset   Heart disease Mother    Asthma Mother    COPD Father    Heart disease Father    Stroke Father    Asthma Daughter    Migraines Daughter    Neuropathy Son    Cancer Neg Hx     Anesthesia problems Neg Hx    Hypotension Neg Hx    Malignant hyperthermia Neg Hx    Pseudochol deficiency Neg Hx     Review of Systems: Constitutional: Doesn't report fevers, chills or abnormal weight loss Eyes: Doesn't report blurriness of vision Ears, nose, mouth, throat, and face: Doesn't report sore throat Respiratory: Doesn't report cough, dyspnea or wheezes Cardiovascular: Doesn't report palpitation, chest discomfort  Gastrointestinal:  Doesn't report nausea, constipation, diarrhea GU: Doesn't report incontinence Skin: Doesn't report skin rashes Neurological: Per HPI Musculoskeletal: Doesn't report joint pain Behavioral/Psych: Doesn't report anxiety  Physical Exam: Vitals:   11/04/21 1054  BP: 108/66  Pulse: 65  Resp: 18  Temp: (!) 97.3 F (36.3 C)  SpO2: 100%    KPS: 80. General: Alert, cooperative, pleasant, in no acute distress Head: Normal EENT: No conjunctival injection or scleral icterus.  Lungs: Resp effort normal Cardiac: Regular rate Abdomen: Non-distended abdomen Skin: No rashes cyanosis or petechiae. Extremities: No clubbing or edema  Neurologic Exam: Mental Status: Awake, alert, attentive to examiner. Oriented to self and environment. Language is fluent with intact comprehension.  Cranial Nerves: Visual acuity is grossly normal. Visual fields are full. Extra-ocular movements intact. No ptosis. Face is symmetric Motor: Tone and bulk are normal. Power is full in both arms and legs. Reflexes are symmetric, no pathologic reflexes present.  Sensory: Intact to light touch Gait: Normal.   Labs: I have reviewed the data as listed    Component Value Date/Time   NA 140 10/28/2020 1447   K 3.8 10/28/2020 1447   CL 105 10/28/2020 1447   CO2 26 10/28/2020 1447   GLUCOSE 117 (H) 10/28/2020 1447   BUN 15 10/28/2020 1447   CREATININE 0.74 10/28/2020 1447   CREATININE 0.72 10/03/2016 0706   CALCIUM 8.6 10/28/2020 1447   PROT 6.6 07/22/2019 0913    ALBUMIN 3.5 07/22/2019 0913   AST 19 07/22/2019 0913   ALT 22 07/22/2019 0913   ALKPHOS 27 (L) 07/22/2019 0913   BILITOT 1.2 07/22/2019 0913  GFRNONAA >60 06/05/2020 0606   GFRAA >60 06/05/2020 0606   Lab Results  Component Value Date   WBC 15.8 (H) 06/05/2020   NEUTROABS 6.6 02/06/2020   HGB 12.5 06/05/2020   HCT 38.7 06/05/2020   MCV 93.5 06/05/2020   PLT 233 06/05/2020    Imaging: [facility] Clinician Interpretation: I have personally reviewed the CNS images as listed.  My interpretation, in the context of the patient's clinical presentation, is stable disease  MR Brain W Wo Contrast  Result Date: 10/29/2021 CLINICAL DATA:  Brain/CNS neoplasm, staging; post radiation EXAM: MRI HEAD WITHOUT AND WITH CONTRAST TECHNIQUE: Multiplanar, multiecho pulse sequences of the brain and surrounding structures were obtained without and with intravenous contrast. CONTRAST:  39mL MULTIHANCE GADOBENATE DIMEGLUMINE 529 MG/ML IV SOLN COMPARISON:  04/29/2021 FINDINGS: Brain: Similar size and appearance of sellar/suprasellar mass. As before, there is cavernous sinus and clival involvement. There are postoperative changes of prior transsphenoidal debulking. As before, mass abuts the optic chiasm and adjacent cisternal optic nerves. Coronal T2 sequence not performed with SRS protocol limiting evaluation of optic nerves. Pituitary infundibulum is poorly evaluated. There is no acute infarction or hemorrhage. Ventricles are stable in size. Chronic small vessel infarcts of the central white matter and cerebellum again identified. Vascular: Major vessel flow voids at the skull base are preserved. Skull and upper cervical spine: As before abnormal signal of the clivus related to involvement by the pituitary mass. Otherwise unremarkable. Sinuses/Orbits: Postoperative changes related to sellar mass debulking. Bilateral lens replacements. Other: Sella is unremarkable.  Mastoid air cells are clear. IMPRESSION: No substantial  change in size and appearance of sellar/suprasellar mass. Electronically Signed   By: Macy Mis M.D.   On: 10/29/2021 14:20      Assessment/Plan PITUITARY ADENOMA  SHANTEL HELWIG is clinically and radiographically stable today.  No new or progressive deficits.   MRI again demonstrates stability of residual adenoma, with no further encroachment into optic chiasm.  Will continue to follow with Dr. Loanne Drilling for diabetes and pituitary survey as needed.  We appreciate the opportunity to participate in the care of LOELLA HICKLE.   We ask that SHERRE WOOTON return to clinic in 12 months following next brain MRI, or sooner as needed.  All questions were answered. The patient knows to call the clinic with any problems, questions or concerns. No barriers to learning were detected.  The total time spent in the encounter was 30 minutes and more than 50% was on counseling and review of test results   Ventura Sellers, MD Medical Director of Neuro-Oncology [facility] [place] 11/04/21 10:52 AM

## 2021-11-04 NOTE — Patient Instructions (Signed)
Please continue the same 4 diabetes medications. Blood tests are requested for you today.  We'll let you know about the results.  check your blood sugar once a day.  vary the time of day when you check, between before the 3 meals, and at bedtime.  also check if you have symptoms of your blood sugar being too high or too low.  please keep a record of the readings and bring it to your next appointment here (or you can bring the meter itself).  You can write it on any piece of paper.  please call us sooner if your blood sugar goes below 70, or if you have a lot of readings over 200.   Please come back for a follow-up appointment in 4 months.

## 2021-11-17 LAB — HM DIABETES EYE EXAM

## 2021-11-29 ENCOUNTER — Other Ambulatory Visit: Payer: Self-pay | Admitting: Endocrinology

## 2021-11-29 DIAGNOSIS — E119 Type 2 diabetes mellitus without complications: Secondary | ICD-10-CM

## 2021-11-29 DIAGNOSIS — E1165 Type 2 diabetes mellitus with hyperglycemia: Secondary | ICD-10-CM | POA: Diagnosis not present

## 2021-11-29 DIAGNOSIS — I10 Essential (primary) hypertension: Secondary | ICD-10-CM | POA: Diagnosis not present

## 2021-12-02 DIAGNOSIS — I10 Essential (primary) hypertension: Secondary | ICD-10-CM | POA: Diagnosis not present

## 2021-12-02 DIAGNOSIS — M25512 Pain in left shoulder: Secondary | ICD-10-CM | POA: Diagnosis not present

## 2021-12-02 DIAGNOSIS — E1165 Type 2 diabetes mellitus with hyperglycemia: Secondary | ICD-10-CM | POA: Diagnosis not present

## 2021-12-02 DIAGNOSIS — E782 Mixed hyperlipidemia: Secondary | ICD-10-CM | POA: Diagnosis not present

## 2021-12-02 DIAGNOSIS — J4540 Moderate persistent asthma, uncomplicated: Secondary | ICD-10-CM | POA: Diagnosis not present

## 2021-12-13 ENCOUNTER — Other Ambulatory Visit: Payer: Self-pay | Admitting: Endocrinology

## 2022-01-03 ENCOUNTER — Other Ambulatory Visit (HOSPITAL_COMMUNITY): Payer: Self-pay | Admitting: Family Medicine

## 2022-01-03 DIAGNOSIS — M25512 Pain in left shoulder: Secondary | ICD-10-CM | POA: Diagnosis not present

## 2022-01-10 ENCOUNTER — Other Ambulatory Visit: Payer: Self-pay | Admitting: Endocrinology

## 2022-01-25 ENCOUNTER — Other Ambulatory Visit: Payer: Self-pay | Admitting: Endocrinology

## 2022-02-08 ENCOUNTER — Other Ambulatory Visit (HOSPITAL_COMMUNITY): Payer: Self-pay | Admitting: Internal Medicine

## 2022-02-08 DIAGNOSIS — Z1231 Encounter for screening mammogram for malignant neoplasm of breast: Secondary | ICD-10-CM

## 2022-02-14 ENCOUNTER — Ambulatory Visit (HOSPITAL_COMMUNITY)
Admission: RE | Admit: 2022-02-14 | Discharge: 2022-02-14 | Disposition: A | Discharge status: Home / Self Care | Payer: Medicare Other | Source: Ambulatory Visit | Attending: Family Medicine | Admitting: Family Medicine

## 2022-02-14 ENCOUNTER — Other Ambulatory Visit: Payer: Self-pay

## 2022-02-14 ENCOUNTER — Ambulatory Visit (HOSPITAL_COMMUNITY)
Admission: RE | Admit: 2022-02-14 | Discharge: 2022-02-14 | Disposition: A | Discharge status: Home / Self Care | Payer: Medicare Other | Source: Ambulatory Visit | Attending: Internal Medicine | Admitting: Internal Medicine

## 2022-02-14 DIAGNOSIS — M19012 Primary osteoarthritis, left shoulder: Secondary | ICD-10-CM | POA: Diagnosis not present

## 2022-02-14 DIAGNOSIS — M25512 Pain in left shoulder: Secondary | ICD-10-CM

## 2022-02-14 DIAGNOSIS — Z1231 Encounter for screening mammogram for malignant neoplasm of breast: Secondary | ICD-10-CM | POA: Insufficient documentation

## 2022-03-08 DIAGNOSIS — I10 Essential (primary) hypertension: Secondary | ICD-10-CM | POA: Diagnosis not present

## 2022-03-08 DIAGNOSIS — E1165 Type 2 diabetes mellitus with hyperglycemia: Secondary | ICD-10-CM | POA: Diagnosis not present

## 2022-03-08 DIAGNOSIS — E039 Hypothyroidism, unspecified: Secondary | ICD-10-CM | POA: Diagnosis not present

## 2022-03-10 ENCOUNTER — Ambulatory Visit: Payer: Medicare Other | Admitting: Orthopedic Surgery

## 2022-03-10 ENCOUNTER — Other Ambulatory Visit: Payer: Self-pay

## 2022-03-10 ENCOUNTER — Ambulatory Visit (INDEPENDENT_AMBULATORY_CARE_PROVIDER_SITE_OTHER): Payer: Medicare Other | Admitting: Endocrinology

## 2022-03-10 ENCOUNTER — Encounter: Payer: Self-pay | Admitting: Endocrinology

## 2022-03-10 ENCOUNTER — Ambulatory Visit: Payer: Medicare Other

## 2022-03-10 VITALS — BP 98/58 | HR 84 | Ht 65.0 in | Wt 147.0 lb

## 2022-03-10 VITALS — BP 106/60 | HR 62 | Ht 65.0 in | Wt 149.0 lb

## 2022-03-10 DIAGNOSIS — E119 Type 2 diabetes mellitus without complications: Secondary | ICD-10-CM | POA: Diagnosis not present

## 2022-03-10 DIAGNOSIS — M75102 Unspecified rotator cuff tear or rupture of left shoulder, not specified as traumatic: Secondary | ICD-10-CM | POA: Diagnosis not present

## 2022-03-10 DIAGNOSIS — M25512 Pain in left shoulder: Secondary | ICD-10-CM

## 2022-03-10 DIAGNOSIS — G8929 Other chronic pain: Secondary | ICD-10-CM

## 2022-03-10 LAB — POCT GLYCOSYLATED HEMOGLOBIN (HGB A1C): Hemoglobin A1C: 6.6 % — AB (ref 4.0–5.6)

## 2022-03-10 MED ORDER — REPAGLINIDE 1 MG PO TABS: ORAL_TABLET | ORAL | 1 refills | Status: DC

## 2022-03-10 NOTE — Patient Instructions (Addendum)
I have sent a prescription to your pharmacy, to reduce the supper repaglinide to 2 pills.   ?Please continue the same other 3 diabetes medications.   ?check your blood sugar once a day.  vary the time of day when you check, between before the 3 meals, and at bedtime.  also check if you have symptoms of your blood sugar being too high or too low.  please keep a record of the readings and bring it to your next appointment here (or you can bring the meter itself).  You can write it on any piece of paper.  please call us sooner if your blood sugar goes below 70, or if you have a lot of readings over 200.   ?Please come back for a follow-up appointment in 4 months.   ?

## 2022-03-10 NOTE — Progress Notes (Signed)
EVALUATION AND MANAGEMENT  ? ?Type of appointment : New patient ? ?PLAN: Recommend injection subacromial space physical therapy oral analgesics nonopioid ? ?Follow-up as needed ? ?No orders of the defined types were placed in this encounter. ? ? ? ?Chief Complaint  ?Patient presents with  ? Shoulder Pain  ?  Lefty shoulder pain ?  ? ? ?We have an 81 year old female she presents with atraumatic pain in her left shoulder  ? ?She has had it for 6 months ? ?She denies any trauma ? ?No prior treatment ? ?Painful forward elevation is noted sleeping is very difficult especially if she lies on the left side ? ? ?Review of Systems  ?Constitutional:  Negative for fever.  ?Neurological:  Negative for tingling.  ?     Weakness left shoulder  ? ? ?Body mass index is 24.46 kg/m?. ? ?Physical Exam ?Vitals reviewed.  ?Constitutional:   ?   Appearance: Normal appearance. She is well-developed.  ?Cardiovascular:  ?   Pulses: Normal pulses.  ?Musculoskeletal:  ?   Right shoulder: Normal. No swelling, deformity, effusion, laceration, tenderness, bony tenderness or crepitus. Normal range of motion. Normal strength. Normal pulse.  ?   Left shoulder: Tenderness present. No swelling, deformity, effusion, laceration, bony tenderness or crepitus. Decreased range of motion. Normal strength. Normal pulse.  ?     Arms: ? ?   Comments: Both shoulders are stable  ? ?Skin: Right shoulder normal  ?         Left shoulder normal  ? ?  ?Neurological:  ?   Mental Status: She is alert and oriented to person, place, and time.  ?   Sensory: Sensation is intact. No sensory deficit.  ?   Gait: Gait normal.  ?Psychiatric:     ?   Mood and Affect: Mood normal.     ?   Thought Content: Thought content normal.  ? ? ?Past Medical History:  ?Diagnosis Date  ? Anxiety   ? Asthma   ? ASYMPTOMATIC POSTMENOPAUSAL STATUS 02/09/2009  ? Cataract   ? Constipation 03/19/2013  ? Depression   ? DIABETES MELLITUS, TYPE II 09/14/2007  ? HYPERCHOLESTEROLEMIA 02/09/2009  ?  HYPERTENSION 02/09/2009  ? HYPOTHYROIDISM 09/14/2007  ? MIGRAINE HEADACHE 09/14/2007  ? Neuropathy   ? OSTEOPOROSIS 02/09/2009  ? PANCREATITIS 09/14/2007  ? 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  ? PONV (postoperative nausea and vomiting)   ? Rectocele 03/19/2013  ? Shortness of breath   ? SUPERFICIAL PHLEBITIS 09/14/2007  ? Varicose veins   ? ?Past Surgical History:  ?Procedure Laterality Date  ? ABDOMINAL HYSTERECTOMY    ? ANTERIOR AND POSTERIOR REPAIR N/A 04/02/2013  ? Procedure: ANTERIOR (CYSTOCELE) AND POSTERIOR REPAIR (RECTOCELE);  Surgeon: Jonnie Kind, MD;  [facility]: [facility];  Service: Gynecology;  Laterality: N/A;  ? APPENDECTOMY    ? BRAIN SURGERY    ? CATARACT EXTRACTION W/PHACO  12/05/2011  ? Procedure: CATARACT EXTRACTION PHACO AND INTRAOCULAR LENS PLACEMENT (IOC);  Surgeon: Tonny Branch;  Location: [facility];  Service: Ophthalmology;  Laterality: Left;  CDE:9.65  ? CHOLECYSTECTOMY    ? CRANIOTOMY N/A 06/04/2020  ? Procedure: Endoscopic Transphenoidal Resection of Recurrent PituitaryTumor;  Surgeon: Kristeen Miss, MD;  Location: [place];  Service: Neurosurgery;  Laterality: N/A;  ? CYSTOSCOPY    ? ENDOVENOUS ABLATION SAPHENOUS VEIN W/ LASER  11-03-2011   right greater saphenous vein  ? left leg done 10-2011  ? EYE SURGERY  98  ? right cataract extraction 98  ? LAPAROSCOPIC NISSEN  FUNDOPLICATION    ? NM ESOPHAGEAL REFLUX  08-11-11  ? PITUITARY EXCISION  10/1997  ? POSTERIOR REPAIR    ? TRANSNASAL APPROACH N/A 06/04/2020  ? Procedure: TRANSNASAL APPROACH;  Surgeon: Jerrell Belfast, MD;  Location: [place];  Service: ENT;  Laterality: N/A;  ? TRANSPHENOIDAL / TRANSNASAL HYPOPHYSECTOMY / RESECTION PITUITARY TUMOR  08-11-11  ? ?Social History  ? ?Tobacco Use  ? Smoking status: Never  ? Smokeless tobacco: Never  ?Vaping Use  ? Vaping Use: Never used  ?Substance Use Topics  ? Alcohol use: No  ?  Alcohol/week: 0.0 standard drinks  ? Drug use: No  ? ? ? ?Assessment and Plan: ? ?X-rays were negative for any acute process but there was mild  proximal migration of the humeral head ? ?Arthritis ? ? ?Encounter Diagnoses  ?Name Primary?  ? Chronic pain in left shoulder   ? Rotator cuff syndrome of left shoulder Yes  ? ? ?Procedure note the subacromial injection shoulder left  ? ?Verbal consent was obtained to inject the  Left   Shoulder ? ?Timeout was completed to confirm the injection site is a subacromial space of the  left  shoulder ? ?Medication used Depo-Medrol 40 mg and lidocaine 1% 3 cc ? ?Anesthesia was provided by ethyl chloride ? ?The injection was performed in the left  posterior subacromial space. After pinning the skin with alcohol and anesthetized the skin with ethyl chloride the subacromial space was injected using a 20-gauge needle. There were no complications ? ?Sterile dressing was applied. ? ? ? ? ? ? ?  ?

## 2022-03-10 NOTE — Patient Instructions (Addendum)
You have received an injection of steroids into the joint. 15% of patients will have increased pain within the 24 hours postinjection.  ? ?This is transient and will go away.  ? ?We recommend that you use ice packs on the injection site for 20 minutes every 2 hours and extra strength Tylenol 2 tablets every 8 as needed until the pain resolves. ? ?If you continue to have pain after taking the Tylenol and using the ice please call the office for further instructions. ? ? ?We have ordered therapy for the shoulder  ? ?If you do not get better let us know and we can see you again  ? ?Physical therapy has been ordered for you at [facility]. They should call you to schedule, [phone] is the phone number to call, if you want to call to schedule.   ?

## 2022-03-10 NOTE — Progress Notes (Signed)
? ?Subjective:  ? ? Patient ID: [name] White, female    DOB: 05-Aug-1941, 81 y.o.   MRN: [id number] ? ?HPI ?Pt returns for f/u of diabetes mellitus:   ?DM type: 2 ?Dx'ed1999/04/10, after an episode of pancreatitis (no pancreatitis since then).   ?Complications: PN ?Therapy: 3 oral meds + Victoza.  ?GDM: never ?DKA: never ?Severe hypoglycemia: never.    ?Pancreatic Imaging April 04, 2001): recurrent pancreatitis.  ?SDOH: husb died April 04, 2020.   ?Other: she has never taken insulin; she cannot take bromocriptine, due to an interaction with maxalt; pioglitizone has caused edema; fructosamine has converted to lower A1c than A1c itself; GLP med is used, as pancreatitis was many years ago.   ?Interval history: pt says cbg's vary from 60-150.  It is in general lowest fasting.  pt states she feels well in general.   ?Past Medical History:  ?Diagnosis Date  ? Anxiety   ? Asthma   ? ASYMPTOMATIC POSTMENOPAUSAL STATUS 02/09/2009  ? Cataract   ? Constipation 03/19/2013  ? Depression   ? DIABETES MELLITUS, TYPE II 09/14/2007  ? HYPERCHOLESTEROLEMIA 02/09/2009  ? HYPERTENSION 02/09/2009  ? HYPOTHYROIDISM 09/14/2007  ? MIGRAINE HEADACHE 09/14/2007  ? Neuropathy   ? OSTEOPOROSIS 02/09/2009  ? PANCREATITIS 09/14/2007  ? 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  ? PONV (postoperative nausea and vomiting)   ? Rectocele 03/19/2013  ? Shortness of breath   ? SUPERFICIAL PHLEBITIS 09/14/2007  ? Varicose veins   ? ? ?Past Surgical History:  ?Procedure Laterality Date  ? ABDOMINAL HYSTERECTOMY    ? ANTERIOR AND POSTERIOR REPAIR N/A 04/02/2013  ? Procedure: ANTERIOR (CYSTOCELE) AND POSTERIOR REPAIR (RECTOCELE);  Surgeon: Jonnie Kind, MD;  [facility];  Service: Gynecology;  Laterality: N/A;  ? APPENDECTOMY    ? BRAIN SURGERY    ? CATARACT EXTRACTION W/PHACO  12/05/2011  ? Procedure: CATARACT EXTRACTION PHACO AND INTRAOCULAR LENS PLACEMENT (IOC);  Surgeon: Tonny Branch;  [facility];  Service: Ophthalmology;  Laterality: Left;  CDE:9.65  ? CHOLECYSTECTOMY    ? CRANIOTOMY N/A  06/04/2020  ? Procedure: Endoscopic Transphenoidal Resection of Recurrent PituitaryTumor;  Surgeon: Kristeen Miss, MD;  Location: [place];  Service: Neurosurgery;  Laterality: N/A;  ? CYSTOSCOPY    ? ENDOVENOUS ABLATION SAPHENOUS VEIN W/ LASER  11-03-2011   right greater saphenous vein  ? left leg done 10-2011  ? EYE SURGERY  98  ? right cataract extraction 98  ? LAPAROSCOPIC NISSEN FUNDOPLICATION    ? NM ESOPHAGEAL REFLUX  08-11-11  ? PITUITARY EXCISION  10/1997  ? POSTERIOR REPAIR    ? TRANSNASAL APPROACH N/A 06/04/2020  ? Procedure: TRANSNASAL APPROACH;  Surgeon: Jerrell Belfast, MD;  Location: [place];  Service: ENT;  Laterality: N/A;  ? TRANSPHENOIDAL / TRANSNASAL HYPOPHYSECTOMY / RESECTION PITUITARY TUMOR  08-11-11  ? ? ?Social History  ? ?Socioeconomic History  ? Marital status: Married  ?  Spouse name: Not on file  ? Number of children: Not on file  ? Years of education: Not on file  ? Highest education level: Not on file  ?Occupational History  ? Occupation: Retired  ?  Employer: RETIRED  ?Tobacco Use  ? Smoking status: Never  ? Smokeless tobacco: Never  ?Vaping Use  ? Vaping Use: Never used  ?Substance and Sexual Activity  ? Alcohol use: No  ?  Alcohol/week: 0.0 standard drinks  ? Drug use: No  ? Sexual activity: Not Currently  ?  Birth control/protection: Surgical  ?Other Topics Concern  ? Not  on file  ?Social History Narrative  ? Not on file  ? ?Social Determinants of Health  ? ?Financial Resource Strain: Not on file  ?Food Insecurity: Not on file  ?Transportation Needs: Not on file  ?Physical Activity: Not on file  ?Stress: Not on file  ?Social Connections: Not on file  ?Intimate Partner Violence: Not on file  ? ? ?Current Outpatient Medications on File Prior to Visit  ?Medication Sig Dispense Refill  ? ACCU-CHEK AVIVA PLUS test strip TEST TWICE DAILY. 100 strip 0  ? albuterol (PROVENTIL HFA;VENTOLIN HFA) 108 (90 BASE) MCG/ACT inhaler Inhale 2 puffs into the lungs every 4 (four) hours as needed for wheezing  or shortness of breath.    ? aspirin 81 MG tablet Take 81 mg by mouth daily.    ? atorvastatin (LIPITOR) 40 MG tablet TAKE ONE (1) TABLET BY MOUTH EVERY DAY 90 tablet 3  ? baclofen (LIORESAL) 10 MG tablet Take 10 mg by mouth daily.    ? BREO ELLIPTA 200-25 MCG/INH AEPB Inhale 1 puff into the lungs daily.     ? carvedilol (COREG) 3.125 MG tablet TAKE ONE TABLET BY MOUTH TWICE A DAY 180 tablet 3  ? cycloSPORINE (RESTASIS) 0.05 % ophthalmic emulsion Place 1 drop into both eyes 2 (two) times daily.    ? dapagliflozin propanediol (FARXIGA) 5 MG TABS tablet Take 2.5 mg by mouth daily before breakfast. (Patient taking differently: Take 5 mg by mouth daily before breakfast.) 15 tablet 11  ? docusate sodium (COLACE) 100 MG capsule Take 1 capsule (100 mg total) by mouth 2 (two) times daily. 10 capsule 0  ? estradiol (ESTRACE) 1 MG tablet TAKE ONE TABLET ('1MG'$  TOTAL) BY MOUTH DAILY (Patient taking differently: Take 1 mg by mouth daily.) 90 tablet 1  ? furosemide (LASIX) 20 MG tablet May take Lasix 20 mg as NEEDED for leg swelling 45 tablet 3  ? HYDROcodone-acetaminophen (NORCO/VICODIN) 5-325 MG tablet Take 1 tablet by mouth every 6 (six) hours as needed for moderate pain.     ? levothyroxine (SYNTHROID) 50 MCG tablet TAKE ONE TABLET (50MCG TOTAL) BY MOUTH DAILY BEFORE BREAKFAST 90 tablet 2  ? metFORMIN (GLUCOPHAGE-XR) 500 MG 24 hr tablet TAKE TWO (2) TABLETS BY MOUTH DAILY 180 tablet 3  ? methocarbamol (ROBAXIN) 500 MG tablet Take 1 tablet (500 mg total) by mouth 2 (two) times daily. 20 tablet 0  ? Multiple Vitamins-Minerals (MULTIVITAMINS THER. W/MINERALS) TABS Take 1 tablet by mouth every morning.    ? omeprazole (PRILOSEC) 20 MG capsule Take 20 mg by mouth every morning.    ? pregabalin (LYRICA) 150 MG capsule TAKE ONE CAPSULE (150 MG TOTAL) BY MOUTHTWO TIMES DAILY. 60 capsule 5  ? rizatriptan (MAXALT-MLT) 10 MG disintegrating tablet Take 10 mg by mouth as needed for migraine. May repeat in 2 hours if needed    ? traMADol  (ULTRAM) 50 MG tablet Take 1 tablet (50 mg total) by mouth every 6 (six) hours as needed. 10 tablet 0  ? UNIFINE PENTIPS 31G X 6 MM MISC USE TO INJECT VICTOZA ONCE DAILY 100 each 2  ? venlafaxine (EFFEXOR-XR) 150 MG 24 hr capsule Take 150 mg by mouth every morning.    ? Vibegron (GEMTESA) 75 MG TABS Take 75 mg by mouth at bedtime.    ? VICTOZA 18 MG/3ML SOPN INJECT 0.3MLS (1.'8MG'$  TOTAL) INTO THE SKIN DAILY 27 mL 2  ? fluticasone (FLONASE) 50 MCG/ACT nasal spray Place 2 sprays into the nose at bedtime.    ?  lisinopril (ZESTRIL) 5 MG tablet Take 1 tablet (5 mg total) by mouth daily. 90 tablet 3  ? ?No current facility-administered medications on file prior to visit.  ? ? ?Allergies  ?Allergen Reactions  ? Betadine [Povidone Iodine] Hives  ? Penicillins Rash  ? ? ?Family History  ?Problem Relation Age of Onset  ? Heart disease Mother   ? Asthma Mother   ? COPD Father   ? Heart disease Father   ? Stroke Father   ? Asthma Daughter   ? Migraines Daughter   ? Neuropathy Son   ? Cancer Neg Hx   ? Anesthesia problems Neg Hx   ? Hypotension Neg Hx   ? Malignant hyperthermia Neg Hx   ? Pseudochol deficiency Neg Hx   ? ? ?BP 106/60   Pulse 62   Ht '5\' 5"'$  (1.651 m)   Wt 149 lb (67.6 kg)   SpO2 98%   BMI 24.79 kg/m?  ? ? ?Review of Systems ? ?   ?Objective:  ? Physical Exam ?Pulses: dorsalis pedis intact bilat.   ?MSK: no deformity of the feet ?CV: leg edema, but there are bilat vv's ?Skin:  no ulcer on the feet.  normal color and temp on the feet. ?Neuro: sensation is intact to touch on the feet.  ? ?A1c=6.6% ? ?   ?Assessment & Plan:  ?Type 2 DM ?Hypoglycemia, due to repaglinide.  ? ?Patient Instructions  ?I have sent a prescription to your pharmacy, to reduce the supper repaglinide to 2 pills.   ?Please continue the same other 3 diabetes medications.   ?check your blood sugar once a day.  vary the time of day when you check, between before the 3 meals, and at bedtime.  also check if you have symptoms of your blood sugar  being too high or too low.  please keep a record of the readings and bring it to your next appointment here (or you can bring the meter itself).  You can write it on any piece of paper.  please call us sooner i

## 2022-03-14 DIAGNOSIS — I10 Essential (primary) hypertension: Secondary | ICD-10-CM | POA: Diagnosis not present

## 2022-03-14 DIAGNOSIS — D352 Benign neoplasm of pituitary gland: Secondary | ICD-10-CM | POA: Diagnosis not present

## 2022-03-14 DIAGNOSIS — E039 Hypothyroidism, unspecified: Secondary | ICD-10-CM | POA: Diagnosis not present

## 2022-03-14 DIAGNOSIS — E1165 Type 2 diabetes mellitus with hyperglycemia: Secondary | ICD-10-CM | POA: Diagnosis not present

## 2022-03-14 DIAGNOSIS — R634 Abnormal weight loss: Secondary | ICD-10-CM | POA: Diagnosis not present

## 2022-03-14 DIAGNOSIS — E782 Mixed hyperlipidemia: Secondary | ICD-10-CM | POA: Diagnosis not present

## 2022-03-14 DIAGNOSIS — M25512 Pain in left shoulder: Secondary | ICD-10-CM | POA: Diagnosis not present

## 2022-03-14 DIAGNOSIS — J4540 Moderate persistent asthma, uncomplicated: Secondary | ICD-10-CM | POA: Diagnosis not present

## 2022-03-28 ENCOUNTER — Other Ambulatory Visit: Payer: Self-pay | Admitting: Endocrinology

## 2022-03-28 ENCOUNTER — Ambulatory Visit (HOSPITAL_COMMUNITY): Payer: Medicare Other | Attending: Orthopedic Surgery

## 2022-03-28 DIAGNOSIS — M25612 Stiffness of left shoulder, not elsewhere classified: Secondary | ICD-10-CM | POA: Insufficient documentation

## 2022-03-28 DIAGNOSIS — R29898 Other symptoms and signs involving the musculoskeletal system: Secondary | ICD-10-CM | POA: Insufficient documentation

## 2022-03-28 DIAGNOSIS — G8929 Other chronic pain: Secondary | ICD-10-CM | POA: Insufficient documentation

## 2022-03-28 DIAGNOSIS — M25512 Pain in left shoulder: Secondary | ICD-10-CM | POA: Insufficient documentation

## 2022-03-29 ENCOUNTER — Other Ambulatory Visit (HOSPITAL_COMMUNITY): Payer: Self-pay

## 2022-03-29 ENCOUNTER — Telehealth: Payer: Self-pay | Admitting: Pharmacy Technician

## 2022-03-29 NOTE — Telephone Encounter (Signed)
Patient Advocate Encounter ? ?Received notification from [facility] that prior authorization for [place] '18MG'$ /3ML is required. ?  ?PA NOT NEEDED AS OF 04.04.23 ?[facility]  ?Status is pending ?  ?[facility] will continue to follow ? ?Obadiah Dennard R Jeidy Hoerner, CPhT ?Patient Advocate ?[facility] ?Phone: [phone] ?Fax:  [phone] ? ?

## 2022-04-06 ENCOUNTER — Ambulatory Visit (HOSPITAL_COMMUNITY): Payer: Medicare Other

## 2022-04-06 ENCOUNTER — Encounter (HOSPITAL_COMMUNITY): Payer: Self-pay

## 2022-04-06 DIAGNOSIS — R29898 Other symptoms and signs involving the musculoskeletal system: Secondary | ICD-10-CM | POA: Diagnosis not present

## 2022-04-06 DIAGNOSIS — G8929 Other chronic pain: Secondary | ICD-10-CM | POA: Diagnosis not present

## 2022-04-06 DIAGNOSIS — M25612 Stiffness of left shoulder, not elsewhere classified: Secondary | ICD-10-CM | POA: Diagnosis not present

## 2022-04-06 DIAGNOSIS — M25512 Pain in left shoulder: Secondary | ICD-10-CM | POA: Diagnosis not present

## 2022-04-06 NOTE — Patient Instructions (Signed)
SELF MASSAGE BALL - LATERAL DELTOID - complete as needed daily. Spend a few minutes. ? ?Place a ball between a wall and the side of your shoulder as shown.  ? ?You can use a tennis ball, lacrosse ball (as shown) or racquetball.   ? ?Lean into the ball to apply gentle pressure to the tight areas. Do not roll over bony areas. ? ?You can hold on one area or move your body to allow the ball to roll up/down or side-to-side.   ? ? ? ?Repeat all exercises 10-15 times, 1-2 times per day. ? ?1) Shoulder Protraction ? ? ? ?Begin with elbows by your side, slowly "punch" straight out in front of you.   ? ? ? ?2) Shoulder Flexion ? ?  Standing:  ?      ? ?Begin with arms at your side with thumbs pointed up, slowly raise both arms up and forward towards overhead.  ? ? ? ? ? ? ? ?4) Internal & External Rotation ? ?  Standing: ?   ? ?Stand with elbows at the side and elbows bent 90 degrees. Move your forearms away from your body, then bring back inward toward the body.   ? ? ?5) Shoulder Abduction ? ? Standing:  ? ? ?  ? ?Begin with your arms next to your side. Slowly move your arms out to the side so that they go overhead, in a jumping jack or snow angel movement.  ? ? ? ?

## 2022-04-06 NOTE — Therapy (Signed)
?OUTPATIENT OCCUPATIONAL THERAPY ORTHO EVALUATION ? ?Patient Name: [name] White ?MRN: [id number] ?DOB:03-12-1941, 81 y.o., female ?Today's Date: 04/07/2022 ? ?PCP: Celene Squibb, MD ?REFERRING PROVIDER: Arther Abbott, MD ? ? OT End of Session - 04/07/22 1016   ? ? Visit Number 1   ? Number of Visits 1   ? Authorization Type [employer] medicare   ? Authorization Time Period $20 copay, no auth needed   ? Progress Note Due on Visit 10   ? OT Start Time 1520   ? OT Stop Time 1558   ? OT Time Calculation (min) 38 min   ? Activity Tolerance Patient tolerated treatment well   ? Behavior During Therapy [facility] for tasks assessed/performed   ? ?  ?  ? ?  ? ? ?Past Medical History:  ?Diagnosis Date  ? Anxiety   ? Asthma   ? ASYMPTOMATIC POSTMENOPAUSAL STATUS 02/09/2009  ? Cataract   ? Constipation 03/19/2013  ? Depression   ? DIABETES MELLITUS, TYPE II 09/14/2007  ? HYPERCHOLESTEROLEMIA 02/09/2009  ? HYPERTENSION 02/09/2009  ? HYPOTHYROIDISM 09/14/2007  ? MIGRAINE HEADACHE 09/14/2007  ? Neuropathy   ? OSTEOPOROSIS 02/09/2009  ? PANCREATITIS 09/14/2007  ? 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  ? PONV (postoperative nausea and vomiting)   ? Rectocele 03/19/2013  ? Shortness of breath   ? SUPERFICIAL PHLEBITIS 09/14/2007  ? Varicose veins   ? ?Past Surgical History:  ?Procedure Laterality Date  ? ABDOMINAL HYSTERECTOMY    ? ANTERIOR AND POSTERIOR REPAIR N/A 04/02/2013  ? Procedure: ANTERIOR (CYSTOCELE) AND POSTERIOR REPAIR (RECTOCELE);  Surgeon: Jonnie Kind, MD;  [facility];  Service: Gynecology;  Laterality: N/A;  ? APPENDECTOMY    ? BRAIN SURGERY    ? CATARACT EXTRACTION W/PHACO  12/05/2011  ? Procedure: CATARACT EXTRACTION PHACO AND INTRAOCULAR LENS PLACEMENT (IOC);  Surgeon: Tonny Branch;  [facility]: [facility];  Service: Ophthalmology;  Laterality: Left;  CDE:9.65  ? CHOLECYSTECTOMY    ? CRANIOTOMY N/A 06/04/2020  ? Procedure: Endoscopic Transphenoidal Resection of Recurrent PituitaryTumor;  Surgeon: Kristeen Miss, MD;  Location: [place];  Service:  Neurosurgery;  Laterality: N/A;  ? CYSTOSCOPY    ? ENDOVENOUS ABLATION SAPHENOUS VEIN W/ LASER  11-03-2011   right greater saphenous vein  ? left leg done 10-2011  ? EYE SURGERY  98  ? right cataract extraction 98  ? LAPAROSCOPIC NISSEN FUNDOPLICATION    ? NM ESOPHAGEAL REFLUX  08-11-11  ? PITUITARY EXCISION  10/1997  ? POSTERIOR REPAIR    ? TRANSNASAL APPROACH N/A 06/04/2020  ? Procedure: TRANSNASAL APPROACH;  Surgeon: Jerrell Belfast, MD;  Location: [place];  Service: ENT;  Laterality: N/A;  ? TRANSPHENOIDAL / TRANSNASAL HYPOPHYSECTOMY / RESECTION PITUITARY TUMOR  08-11-11  ? ?Patient Active Problem List  ? Diagnosis Date Noted  ? Status post transsphenoidal pituitary resection ([place]) 06/04/2020  ? Pituitary adenoma with extrasellar extension ([place]) 06/04/2020  ? Pituitary tumor 05/29/2020  ? Dizzinesses 02/02/2020  ? Acute pain of right shoulder 02/02/2020  ? Encounter for screening mammogram for malignant neoplasm of breast 01/28/2020  ? Vitamin D deficiency 01/28/2020  ? Paroxysmal supraventricular tachycardia ([place]) 06/19/2014  ? Rectocele 03/19/2013  ? Constipation 03/19/2013  ? HYPERCHOLESTEROLEMIA 02/09/2009  ? Essential hypertension 02/09/2009  ? Osteoporosis 02/09/2009  ? Post-menopausal 02/09/2009  ? HEAT INTOLERANCE 02/01/2008  ? 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  ? Hypothyroidism 09/14/2007  ? Diabetes ([place]) 09/14/2007  ? Migraine headache 09/14/2007  ? SUPERFICIAL PHLEBITIS 09/14/2007  ? PANCREATITIS 09/14/2007  ? MENOPAUSAL SYNDROME  09/14/2007  ? FATIGUE 09/14/2007  ? ? ?ONSET DATE: Approximately 6 months ago or more.  ? ?REFERRING DIAG: Left chronic shoulder pain ? ?THERAPY DIAG:  ?Other symptoms and signs involving the musculoskeletal system ? ?Chronic left shoulder pain ? ?Stiffness of left shoulder, not elsewhere classified ? ?SUBJECTIVE:  ? ?SUBJECTIVE STATEMENT: ?S: When I received the injection I had no pain for 3-4 weeks. Now it's returned.  ?Pt accompanied by: self ? ?PERTINENT HISTORY: Pt presents  with chronic left shoulder pain with no known cause. This pain has been ongoing for 6 months or longer. At appointment with Dr. Aline Brochure, she received an injection which has helped a lot. For 2 weeks she experienced no pain then returned although not as severe.  ? ?PRECAUTIONS: None ? ?WEIGHT BEARING RESTRICTIONS No ? ?PAIN:  ?Are you having pain? Yes: NPRS scale: 5/10 ?Pain location: left shoulder ?Pain description: ache, constant ?Aggravating factors: sweeping vacuuming, washing/fixing hair  ?Relieving factors: rest, injection did help decrease the pain. ?When trying to sleep pain will increase to 6/10. ? ?FALLS: Has patient fallen in last 6 months? No ? ?LIVING ENVIRONMENT: ?Lives with: lives with their family ? ? ?PLOF: Independent ? ?PATIENT GOALS Decreased or no pain  ? ?OBJECTIVE:  ? ?HAND DOMINANCE: Right ? ?ADLs: ?Overall ADLs: Difficulty mopping floors with Swiffer wet jet (half way through needed to stop and rest). Reaching up overhead.pulling pants up over hips is a little uncomfortable. ? ? ? ?UE ROM    ?All ROM was completed with pain.  ?Active ROM ?Seated. IR/er adducted Left ?04/07/2022  ?Shoulder flexion 124 (baseline)  ?Shoulder abduction 135 (baseline)  ?Shoulder internal rotation 90  ?Shoulder external rotation 60  ?(Blank rows = not tested) ? ? ? ?UE MMT:    ?Pain effects strength.  ?MMT ?Seated. IR/er adducted Left ?04/07/2022  ?Shoulder flexion 4/5  ?Shoulder abduction 4+/5  ?Shoulder internal rotation 5/5  ?Shoulder external rotation 4+/5  ?(Blank rows = not tested) ? ? ? ?COGNITION: ?Overall cognitive status: Within functional limits for tasks assessed ? ? ?OBSERVATIONS: Mod fascial restrictions noted in the left upper arm and upper trapezius.  ? ? ?TODAY'S TREATMENT:  ? ? ? ?PATIENT EDUCATION: ?Education details: self myofascial release with tennis ball, A/ROM shoulder exercises.  ?Person educated: Patient ?Education method: Explanation, Demonstration, Verbal cues, and Handouts ?Education  comprehension: verbalized understanding and returned demonstration ? ? ?HOME EXERCISE PROGRAM: ?Eval: see education above ? ?GOALS: ? ?SHORT TERM GOALS: Target date:  04/06/22 ? ?Pt will be educated and verbalize understanding of HEP in order to facilitate improvement in her left UE and increase her ability to utilize it for all daily tasks with less pain and discomfort.  ?Baseline: ?Goal status: MET ? ?ASSESSMENT: ? ?CLINICAL IMPRESSION: ?Patient is a 81 y.o. female  who was seen today for occupational therapy evaluation for left chronic shoulder pain. After assessing deficits, therapy recommendations were discussed. OT recommended 1-2X a week for 4 weeks. Pt verbalized that she is unable to leave her dog alone and needs to have someone watch them when ever she goes to an appointment or anywhere where he can not come. Decided to complete HEP at home for 4-6 weeks and if no improvement is noted and she wishes to return to therapy to request a new OT referral from MD. Patient agreed with recommendation.  ? ?PERFORMANCE DEFICITS in functional skills including ADLs, ROM, pain, mobility, and UE functional use ? ?IMPAIRMENTS are limiting patient from ADLs, IADLs, rest and  sleep, and leisure.  ? ?COMORBIDITIES may have co-morbidities  that affects occupational performance. Patient will benefit from skilled OT to address above impairments and improve overall function. ? ?MODIFICATION OR ASSISTANCE TO COMPLETE EVALUATION: No modification of tasks or assist necessary to complete an evaluation. ? ?OT OCCUPATIONAL PROFILE AND HISTORY: Problem focused assessment: Including review of records relating to presenting problem. ? ?CLINICAL DECISION MAKING: LOW - limited treatment options, no task modification necessary ? ?REHAB POTENTIAL: Excellent ? ?EVALUATION COMPLEXITY: Low ? ? ? ? ? ?PLAN: ?OT FREQUENCY: one time visit ? ? ? ?PLANNED INTERVENTIONS: patient/family education ? ? ? ?CONSULTED AND AGREED WITH PLAN OF CARE:  Patient ? ?PLAN FOR NEXT SESSION: One time visit with HEP. Patient will follow up with MD if she feels she needs to return to clinic for re-evaluation. ? ? ?Ailene Ravel, OTR/L,CBIS  ?[phone] ? ?04/07/2022, 10:19

## 2022-04-25 DIAGNOSIS — E039 Hypothyroidism, unspecified: Secondary | ICD-10-CM | POA: Diagnosis not present

## 2022-05-01 ENCOUNTER — Other Ambulatory Visit: Payer: Self-pay

## 2022-05-01 ENCOUNTER — Emergency Department (HOSPITAL_COMMUNITY): Payer: Medicare Other

## 2022-05-01 ENCOUNTER — Emergency Department (HOSPITAL_COMMUNITY)
Admission: EM | Admit: 2022-05-01 | Discharge: 2022-05-01 | Disposition: A | Discharge status: Home / Self Care | Payer: Medicare Other | Attending: Emergency Medicine | Admitting: Emergency Medicine

## 2022-05-01 ENCOUNTER — Encounter (HOSPITAL_COMMUNITY): Payer: Self-pay

## 2022-05-01 DIAGNOSIS — Z79899 Other long term (current) drug therapy: Secondary | ICD-10-CM | POA: Diagnosis not present

## 2022-05-01 DIAGNOSIS — W06XXXA Fall from bed, initial encounter: Secondary | ICD-10-CM | POA: Insufficient documentation

## 2022-05-01 DIAGNOSIS — S0990XA Unspecified injury of head, initial encounter: Secondary | ICD-10-CM | POA: Diagnosis not present

## 2022-05-01 DIAGNOSIS — Z7984 Long term (current) use of oral hypoglycemic drugs: Secondary | ICD-10-CM | POA: Insufficient documentation

## 2022-05-01 DIAGNOSIS — Z7982 Long term (current) use of aspirin: Secondary | ICD-10-CM | POA: Insufficient documentation

## 2022-05-01 DIAGNOSIS — S0101XA Laceration without foreign body of scalp, initial encounter: Secondary | ICD-10-CM | POA: Insufficient documentation

## 2022-05-01 DIAGNOSIS — G319 Degenerative disease of nervous system, unspecified: Secondary | ICD-10-CM | POA: Diagnosis not present

## 2022-05-01 DIAGNOSIS — E119 Type 2 diabetes mellitus without complications: Secondary | ICD-10-CM | POA: Insufficient documentation

## 2022-05-01 DIAGNOSIS — I10 Essential (primary) hypertension: Secondary | ICD-10-CM | POA: Diagnosis not present

## 2022-05-01 DIAGNOSIS — I6782 Cerebral ischemia: Secondary | ICD-10-CM | POA: Diagnosis not present

## 2022-05-01 DIAGNOSIS — I639 Cerebral infarction, unspecified: Secondary | ICD-10-CM | POA: Diagnosis not present

## 2022-05-01 NOTE — Discharge Instructions (Signed)
Exam and imaging are reassuring, you have 2 staples in the back your head, please refrain getting wet for the first 24 hours, starting tomorrow you may rinse out the wound.  May use over-the-counter pain medication as needed. ? ?Please follow-up in next 8 to 10 days for staple removal. ? ?Come back to the emergency department if you develop chest pain, shortness of breath, severe abdominal pain, uncontrolled nausea, vomiting, diarrhea. ? ?

## 2022-05-01 NOTE — ED Provider Notes (Signed)
?[facility] ?Provider Note ? ? ?CSN: [id number] ?Arrival date & time: 05/01/22  0844 ? ?  ? ?History ? ?Chief Complaint  ?Patient presents with  ? Head Laceration  ? ? ?[name] White is a 81 y.o. female. ? ?HPI ? ?Patient with medical history including hypertension, diabetes presents after a fall.  Patient states that she was in bed and rolled off the side where she struck the right side of her head on her nightstand.  She denies loss of conscious, she is not on anticoag's.  She states that she was able to get herself up off the floor and get back to the bed.  She states that she is having no complaints at this time, she denies headaches change in vision paresthesias or weakness in the upper or lower extremities, denies any lightheaded dizziness nausea or vomiting.  She denies any neck pain back pain chest pain abdominal pain pain in the upper or lower extremities she is able to ambulate without difficulty.  She is unaware of when her last tetanus shot was. ? ?Home Medications ?Prior to Admission medications   ?Medication Sig Start Date End Date Taking? Authorizing Provider  ?ACCU-CHEK AVIVA PLUS test strip TEST TWICE DAILY. 01/14/20   Corum, Rex Kras, MD  ?albuterol (PROVENTIL HFA;VENTOLIN HFA) 108 (90 BASE) MCG/ACT inhaler Inhale 2 puffs into the lungs every 4 (four) hours as needed for wheezing or shortness of breath.    [provider]  ?aspirin 81 MG tablet Take 81 mg by mouth daily.    [provider]  ?atorvastatin (LIPITOR) 40 MG tablet TAKE ONE (1) TABLET BY MOUTH EVERY DAY 09/10/21   Arnoldo Lenis, MD  ?baclofen (LIORESAL) 10 MG tablet Take 10 mg by mouth daily.    [provider]  ?Adair Patter 200-25 MCG/INH AEPB Inhale 1 puff into the lungs daily.  09/08/18   [provider]  ?carvedilol (COREG) 3.125 MG tablet TAKE ONE TABLET BY MOUTH TWICE A DAY 08/16/21   Arnoldo Lenis, MD  ?cycloSPORINE (RESTASIS) 0.05 % ophthalmic emulsion Place 1 drop  into both eyes 2 (two) times daily.    [provider]  ?dapagliflozin propanediol (FARXIGA) 5 MG TABS tablet Take 2.5 mg by mouth daily before breakfast. ?Patient taking differently: Take 5 mg by mouth daily before breakfast. 12/26/19   Renato Shin, MD  ?docusate sodium (COLACE) 100 MG capsule Take 1 capsule (100 mg total) by mouth 2 (two) times daily. 06/06/20   Vallarie Mare, MD  ?estradiol (ESTRACE) 1 MG tablet TAKE ONE TABLET ('1MG'$  TOTAL) BY MOUTH DAILY ?Patient taking differently: Take 1 mg by mouth daily. 02/19/20   Jonnie Kind, MD  ?fluticasone Asencion Islam) 50 MCG/ACT nasal spray Place 2 sprays into the nose at bedtime. 09/04/20 09/04/21  [provider]  ?furosemide (LASIX) 20 MG tablet May take Lasix 20 mg as NEEDED for leg swelling 03/13/15   Arnoldo Lenis, MD  ?HYDROcodone-acetaminophen (NORCO/VICODIN) 5-325 MG tablet Take 1 tablet by mouth every 6 (six) hours as needed for moderate pain.     [provider]  ?levothyroxine (SYNTHROID) 50 MCG tablet TAKE ONE TABLET (50MCG TOTAL) BY MOUTH DAILY BEFORE BREAKFAST 12/13/21   Renato Shin, MD  ?lisinopril (ZESTRIL) 5 MG tablet Take 1 tablet (5 mg total) by mouth daily. 08/12/21 11/10/21  Arnoldo Lenis, MD  ?metFORMIN (GLUCOPHAGE-XR) 500 MG 24 hr tablet TAKE TWO (2) TABLETS BY MOUTH DAILY 11/30/21   Renato Shin, MD  ?  methocarbamol (ROBAXIN) 500 MG tablet Take 1 tablet (500 mg total) by mouth 2 (two) times daily. 04/02/20   Margarita Mail, PA-C  ?Multiple Vitamins-Minerals (MULTIVITAMINS THER. W/MINERALS) TABS Take 1 tablet by mouth every morning.    [provider]  ?omeprazole (PRILOSEC) 20 MG capsule Take 20 mg by mouth every morning.    [provider]  ?pregabalin (LYRICA) 150 MG capsule TAKE ONE CAPSULE (150 MG TOTAL) BY MOUTHTWO TIMES DAILY. 06/27/20   Jonnie Kind, MD  ?repaglinide (PRANDIN) 1 MG tablet TAKE ONE TABLET BY MOUTH WITH BREAKFAST,4 TABLETS WITH LUNCH, AND 2 TABLETS [employer]  03/10/22   Renato Shin, MD  ?rizatriptan (MAXALT-MLT) 10 MG disintegrating tablet Take 10 mg by mouth as needed for migraine. May repeat in 2 hours if needed    [provider]  ?traMADol (ULTRAM) 50 MG tablet Take 1 tablet (50 mg total) by mouth every 6 (six) hours as needed. 04/02/20   Margarita Mail, PA-C  ?UNIFINE PENTIPS 31G X 6 MM MISC USE TO INJECT VICTOZA ONCE DAILY 01/10/22   Renato Shin, MD  ?venlafaxine (EFFEXOR-XR) 150 MG 24 hr capsule Take 150 mg by mouth every morning.    [provider]  ?Vibegron (GEMTESA) 75 MG TABS Take 75 mg by mouth at bedtime.    [provider]  ?VICTOZA 18 MG/3ML SOPN INJECT 0.3MLS (1.'8MG'$  TOTAL) INTO THE SKIN DAILY 03/28/22   Renato Shin, MD  ?   ? ?Allergies    ?Betadine [povidone iodine] and Penicillins   ? ?Review of Systems   ?Review of Systems  ?Constitutional:  Negative for chills and fever.  ?Respiratory:  Negative for shortness of breath.   ?Cardiovascular:  Negative for chest pain.  ?Gastrointestinal:  Negative for abdominal pain.  ?Skin:  Positive for wound.  ?Neurological:  Negative for headaches.  ? ?Physical Exam ?Updated Vital Signs ?BP 107/69   Pulse 77   Temp 98 ?F (36.7 ?C) (Oral)   Resp 19   Ht '5\' 5"'$  (1.651 m)   Wt 68 kg   SpO2 92%   BMI 24.96 kg/m?  ?Physical Exam ?Vitals and nursing note reviewed.  ?Constitutional:   ?   General: She is not in acute distress. ?   Appearance: She is not ill-appearing.  ?HENT:  ?   Head: Normocephalic and atraumatic.  ?   Comments: Patient is a noted laceration on the right occipital lobe, measuring approximately 3 cm in length about 3 mm in depth no foreign bodies present, no crepitus or deformities around the area.  She has no noted raccoon eyes or battle sign present. ?   Nose: No congestion.  ?   Mouth/Throat:  ?   Mouth: Mucous membranes are moist.  ?   Pharynx: Oropharynx is clear. No oropharyngeal exudate or posterior oropharyngeal erythema.  ?   Comments: No trismus no torticollis no  oral trauma present. ?Eyes:  ?   Extraocular Movements: Extraocular movements intact.  ?   Conjunctiva/sclera: Conjunctivae normal.  ?Cardiovascular:  ?   Rate and Rhythm: Normal rate and regular rhythm.  ?   Pulses: Normal pulses.  ?   Heart sounds: No murmur heard. ?  No friction rub. No gallop.  ?Pulmonary:  ?   Effort: No respiratory distress.  ?   Breath sounds: No wheezing, rhonchi or rales.  ?Chest:  ?   Chest wall: No tenderness.  ?Abdominal:  ?   Palpations: Abdomen is soft.  ?   Tenderness: There is no  abdominal tenderness. There is no right CVA tenderness or left CVA tenderness.  ?Musculoskeletal:  ?   Comments: Spine was palpated was nontender to palpation no step-off or deformities noted, no pelvis instability no leg shortening no internal/external rotation of the lower extremities.  She is moving all 4 extremities without difficulty.  ?Skin: ?   General: Skin is warm and dry.  ?Neurological:  ?   Mental Status: She is alert.  ?   Comments: No facial asymmetry, no difficulty with word finding, following two-step commands, no unilateral weakness present.  ?Psychiatric:     ?   Mood and Affect: Mood normal.  ? ? ?ED Results / Procedures / Treatments   ?Labs ?(all labs ordered are listed, but only abnormal results are displayed) ?Labs Reviewed - No data to display ? ?EKG ?None ? ?Radiology ?CT Head Wo Contrast ? ?Result Date: 05/01/2022 ?CLINICAL DATA:  81 year old female with history of trauma from a fall. Laceration in the right posterior head. EXAM: CT HEAD WITHOUT CONTRAST TECHNIQUE: Contiguous axial images were obtained from the base of the skull through the vertex without intravenous contrast. RADIATION DOSE REDUCTION: This exam was performed according to the departmental dose-optimization program which includes automated exposure control, adjustment of the mA and/or kV according to patient size and/or use of iterative reconstruction technique. COMPARISON:  Head CT 04/02/2020. FINDINGS: Brain: Again  noted is a sellar/suprasellar mass lesion measuring 2.0 x 1.4 cm (axial image 13 of series 2), which involves the cavernous sinus and clivus, stable. Well-defined foci of low attenuation in the cerebellar hemispheres

## 2022-05-01 NOTE — ED Notes (Signed)
Blood cleaned off and out of head laceration.Bleeding under control.  ?

## 2022-05-01 NOTE — ED Triage Notes (Addendum)
Patient from home states she fell out of bed while sleeping and hit head on nightstand. She has laceration noted to right posterior head bleeding is controlled and denies LOC. ?

## 2022-05-20 ENCOUNTER — Other Ambulatory Visit: Payer: Self-pay | Admitting: Cardiology

## 2022-05-24 DIAGNOSIS — K219 Gastro-esophageal reflux disease without esophagitis: Secondary | ICD-10-CM | POA: Diagnosis not present

## 2022-07-08 DIAGNOSIS — E039 Hypothyroidism, unspecified: Secondary | ICD-10-CM | POA: Diagnosis not present

## 2022-07-08 DIAGNOSIS — E782 Mixed hyperlipidemia: Secondary | ICD-10-CM | POA: Diagnosis not present

## 2022-07-08 DIAGNOSIS — E1165 Type 2 diabetes mellitus with hyperglycemia: Secondary | ICD-10-CM | POA: Diagnosis not present

## 2022-07-14 DIAGNOSIS — E1165 Type 2 diabetes mellitus with hyperglycemia: Secondary | ICD-10-CM | POA: Diagnosis not present

## 2022-07-14 DIAGNOSIS — E782 Mixed hyperlipidemia: Secondary | ICD-10-CM | POA: Diagnosis not present

## 2022-07-14 DIAGNOSIS — R634 Abnormal weight loss: Secondary | ICD-10-CM | POA: Diagnosis not present

## 2022-07-14 DIAGNOSIS — J4540 Moderate persistent asthma, uncomplicated: Secondary | ICD-10-CM | POA: Diagnosis not present

## 2022-07-14 DIAGNOSIS — D352 Benign neoplasm of pituitary gland: Secondary | ICD-10-CM | POA: Diagnosis not present

## 2022-07-14 DIAGNOSIS — M25512 Pain in left shoulder: Secondary | ICD-10-CM | POA: Diagnosis not present

## 2022-07-14 DIAGNOSIS — E559 Vitamin D deficiency, unspecified: Secondary | ICD-10-CM | POA: Diagnosis not present

## 2022-07-14 DIAGNOSIS — E039 Hypothyroidism, unspecified: Secondary | ICD-10-CM | POA: Diagnosis not present

## 2022-07-14 DIAGNOSIS — K219 Gastro-esophageal reflux disease without esophagitis: Secondary | ICD-10-CM | POA: Diagnosis not present

## 2022-07-14 DIAGNOSIS — I10 Essential (primary) hypertension: Secondary | ICD-10-CM | POA: Diagnosis not present

## 2022-07-14 DIAGNOSIS — R5383 Other fatigue: Secondary | ICD-10-CM | POA: Diagnosis not present

## 2022-08-11 ENCOUNTER — Other Ambulatory Visit: Payer: Self-pay | Admitting: *Deleted

## 2022-08-11 NOTE — Patient Outreach (Signed)
  Care Coordination   08/11/2022 Name: [name] White MRN: [id number] DOB: 1941/08/02   Care Coordination Outreach Attempts:  Contact was made with the patient today to offer care coordination services as a benefit of their health plan. Patient declined services. Follow Up Plan:  No further outreach attempts will be made at this time.   Encounter Outcome:  Pt. Visit Completed  Care Coordination Interventions Activated:  No   Care Coordination Interventions:  No, not indicated    Emelia Loron RN, BSN [place] [phone] [email or web address] [email or web address]

## 2022-08-12 ENCOUNTER — Encounter: Payer: Self-pay | Admitting: Cardiology

## 2022-08-12 ENCOUNTER — Ambulatory Visit: Payer: Medicare Other | Admitting: Cardiology

## 2022-08-12 VITALS — BP 90/60 | HR 78 | Ht 65.0 in | Wt 146.0 lb

## 2022-08-12 DIAGNOSIS — R002 Palpitations: Secondary | ICD-10-CM

## 2022-08-12 DIAGNOSIS — R600 Localized edema: Secondary | ICD-10-CM

## 2022-08-12 DIAGNOSIS — I10 Essential (primary) hypertension: Secondary | ICD-10-CM | POA: Diagnosis not present

## 2022-08-12 DIAGNOSIS — E782 Mixed hyperlipidemia: Secondary | ICD-10-CM

## 2022-08-12 MED ORDER — LISINOPRIL 2.5 MG PO TABS: 2.5000 mg | ORAL_TABLET | Freq: Every day | ORAL | 3 refills | Status: DC

## 2022-08-12 NOTE — Patient Instructions (Signed)
Medication Instructions:  Your physician has recommended you make the following change in your medication:  Decrease Lisinopril to 2.5 mg tablets daily   Labwork: None  Testing/Procedures: None  Follow-Up: Follow up with Dr. Harl Bowie in 1 year.   Any Other Special Instructions Will Be Listed Below (If Applicable).     If you need a refill on your cardiac medications before your next appointment, please call your pharmacy.

## 2022-08-12 NOTE — Progress Notes (Signed)
Clinical Summary [name] White is a 81 y.o.female seen today for follow up of the following medical problems.    1. Palpitations   - no palpitations.   2. HTN -she remains compliant with med - some recent SBP in 90s at other doctors appts  -compliant with meds - last visit lowered lisinpril due to soft bp's.      3. Hyperlipidemia Jan 2019 TC 113 TG 116 HDL 42 LDL 48 - compliant with statin.    - most recent labs with pcp 07/2021 TC 134 TG 145 HDL 45 LDL 64   4. LE edema - seen by vascular, evidence of bilateral venous reflux - 06/2015 [place] no DVT - echo 06/2014 LVEF 60-65%, grade II diastolic dysfunction.    -infrequent edema. Takes about 1-2 times per month.  - wears compression socks.     5.Pituitary adenoma - s/p transphenoidal pituitary resection - undergoing radiation   - chronic stable. Taking lasix prn, doesn't require often  6. DM2  -followed by pcp   SH: husband passed Aug 06 2020 One daughter lives in town, daughter in [place], son in [place] Past Medical History:  Diagnosis Date   Anxiety    Asthma    ASYMPTOMATIC POSTMENOPAUSAL STATUS 02/09/2009   Cataract    Constipation 03/19/2013   Depression    DIABETES MELLITUS, TYPE II 09/14/2007   HYPERCHOLESTEROLEMIA 02/09/2009   HYPERTENSION 02/09/2009   HYPOTHYROIDISM 09/14/2007   MIGRAINE HEADACHE 09/14/2007   Neuropathy    OSTEOPOROSIS 02/09/2009   PANCREATITIS 09/14/2007   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   PONV (postoperative nausea and vomiting)    Rectocele 03/19/2013   Shortness of breath    SUPERFICIAL PHLEBITIS 09/14/2007   Varicose veins      Allergies  Allergen Reactions   Betadine [Povidone Iodine] Hives   Penicillins Rash     Current Outpatient Medications  Medication Sig Dispense Refill   ACCU-CHEK AVIVA PLUS test strip TEST TWICE DAILY. 100 strip 0   albuterol (PROVENTIL HFA;VENTOLIN HFA) 108 (90 BASE) MCG/ACT inhaler Inhale 2 puffs into the lungs every 4 (four) hours as needed for  wheezing or shortness of breath.     aspirin 81 MG tablet Take 81 mg by mouth daily.     atorvastatin (LIPITOR) 40 MG tablet TAKE ONE (1) TABLET BY MOUTH EVERY DAY 90 tablet 3   baclofen (LIORESAL) 10 MG tablet Take 10 mg by mouth daily.     BREO ELLIPTA 200-25 MCG/INH AEPB Inhale 1 puff into the lungs daily.      carvedilol (COREG) 3.125 MG tablet TAKE ONE TABLET BY MOUTH TWICE A DAY 180 tablet 3   cycloSPORINE (RESTASIS) 0.05 % ophthalmic emulsion Place 1 drop into both eyes 2 (two) times daily.     dapagliflozin propanediol (FARXIGA) 5 MG TABS tablet Take 2.5 mg by mouth daily before breakfast. (Patient taking differently: Take 5 mg by mouth daily before breakfast.) 15 tablet 11   docusate sodium (COLACE) 100 MG capsule Take 1 capsule (100 mg total) by mouth 2 (two) times daily. 10 capsule 0   estradiol (ESTRACE) 1 MG tablet TAKE ONE TABLET (1MG TOTAL) BY MOUTH DAILY (Patient taking differently: Take 1 mg by mouth daily.) 90 tablet 1   fluticasone (FLONASE) 50 MCG/ACT nasal spray Place 2 sprays into the nose at bedtime.     furosemide (LASIX) 20 MG tablet May take Lasix 20 mg as NEEDED for leg swelling 45 tablet 3   HYDROcodone-acetaminophen (NORCO/VICODIN) 5-325 MG  tablet Take 1 tablet by mouth every 6 (six) hours as needed for moderate pain.      levothyroxine (SYNTHROID) 50 MCG tablet TAKE ONE TABLET (50MCG TOTAL) BY MOUTH DAILY BEFORE BREAKFAST 90 tablet 2   lisinopril (ZESTRIL) 5 MG tablet TAKE ONE (1) TABLET BY MOUTH EVERY DAY 90 tablet 3   metFORMIN (GLUCOPHAGE-XR) 500 MG 24 hr tablet TAKE TWO (2) TABLETS BY MOUTH DAILY 180 tablet 3   methocarbamol (ROBAXIN) 500 MG tablet Take 1 tablet (500 mg total) by mouth 2 (two) times daily. 20 tablet 0   Multiple Vitamins-Minerals (MULTIVITAMINS THER. W/MINERALS) TABS Take 1 tablet by mouth every morning.     omeprazole (PRILOSEC) 20 MG capsule Take 20 mg by mouth every morning.     pregabalin (LYRICA) 150 MG capsule TAKE ONE CAPSULE (150 MG TOTAL)  BY MOUTHTWO TIMES DAILY. 60 capsule 5   repaglinide (PRANDIN) 1 MG tablet TAKE ONE TABLET BY MOUTH WITH BREAKFAST,4 TABLETS WITH LUNCH, AND 2 TABLETS WITHSUPPER 810 tablet 1   rizatriptan (MAXALT-MLT) 10 MG disintegrating tablet Take 10 mg by mouth as needed for migraine. May repeat in 2 hours if needed     traMADol (ULTRAM) 50 MG tablet Take 1 tablet (50 mg total) by mouth every 6 (six) hours as needed. 10 tablet 0   UNIFINE PENTIPS 31G X 6 MM MISC USE TO INJECT VICTOZA ONCE DAILY 100 each 2   venlafaxine (EFFEXOR-XR) 150 MG 24 hr capsule Take 150 mg by mouth every morning.     Vibegron (GEMTESA) 75 MG TABS Take 75 mg by mouth at bedtime.     VICTOZA 18 MG/3ML SOPN INJECT 0.3MLS (1.8MG TOTAL) INTO THE SKIN DAILY 27 mL 2   No current facility-administered medications for this visit.     Past Surgical History:  Procedure Laterality Date   ABDOMINAL HYSTERECTOMY     ANTERIOR AND POSTERIOR REPAIR N/A 04/02/2013   Procedure: ANTERIOR (CYSTOCELE) AND POSTERIOR REPAIR (RECTOCELE);  Surgeon: Jonnie Kind, MD;  Location: [facility];  Service: Gynecology;  Laterality: N/A;   APPENDECTOMY     BRAIN SURGERY     CATARACT EXTRACTION W/PHACO  12/05/2011   Procedure: CATARACT EXTRACTION PHACO AND INTRAOCULAR LENS PLACEMENT (IOC);  Surgeon: Tonny Poonam Woehrle;  Location: [facility];  Service: Ophthalmology;  Laterality: Left;  CDE:9.65   CHOLECYSTECTOMY     CRANIOTOMY N/A 06/04/2020   Procedure: Endoscopic Transphenoidal Resection of Recurrent PituitaryTumor;  Surgeon: Kristeen Miss, MD;  Location: [facility];  Service: Neurosurgery;  Laterality: N/A;   CYSTOSCOPY     ENDOVENOUS ABLATION SAPHENOUS VEIN W/ LASER  11-03-2011   right greater saphenous vein   left leg done 10-2011   EYE SURGERY  98   right cataract extraction 98   LAPAROSCOPIC NISSEN FUNDOPLICATION     NM ESOPHAGEAL REFLUX  08-11-11   PITUITARY EXCISION  10/1997   POSTERIOR REPAIR     TRANSNASAL APPROACH N/A 06/04/2020   Procedure: TRANSNASAL APPROACH;   Surgeon: Jerrell Belfast, MD;  Location: [facility];  Service: ENT;  Laterality: N/A;   TRANSPHENOIDAL / TRANSNASAL HYPOPHYSECTOMY / RESECTION PITUITARY TUMOR  08-11-11     Allergies  Allergen Reactions   Betadine [Povidone Iodine] Hives   Penicillins Rash      Family History  Problem Relation Age of Onset   Heart disease Mother    Asthma Mother    COPD Father    Heart disease Father    Stroke Father    Asthma Daughter  Migraines Daughter    Neuropathy Son    Cancer Neg Hx    Anesthesia problems Neg Hx    Hypotension Neg Hx    Malignant hyperthermia Neg Hx    Pseudochol deficiency Neg Hx      Social History Ms. Janish reports that she has never smoked. She has never used smokeless tobacco. Ms. Mcquerry reports no history of alcohol use.   Review of Systems CONSTITUTIONAL: No weight loss, fever, chills, weakness or fatigue.  HEENT: Eyes: No visual loss, blurred vision, double vision or yellow sclerae.No hearing loss, sneezing, congestion, runny nose or sore throat.  SKIN: No rash or itching.  CARDIOVASCULAR: per hpi RESPIRATORY: No shortness of breath, cough or sputum.  GASTROINTESTINAL: No anorexia, nausea, vomiting or diarrhea. No abdominal pain or blood.  GENITOURINARY: No burning on urination, no polyuria NEUROLOGICAL: No headache, dizziness, syncope, paralysis, ataxia, numbness or tingling in the extremities. No change in bowel or bladder control.  MUSCULOSKELETAL: No muscle, back pain, joint pain or stiffness.  LYMPHATICS: No enlarged nodes. No history of splenectomy.  PSYCHIATRIC: No history of depression or anxiety.  ENDOCRINOLOGIC: No reports of sweating, cold or heat intolerance. No polyuria or polydipsia.  Marland Kitchen   Physical Examination Today's Vitals   08/12/22 1257  BP: 90/60  Pulse: 78  SpO2: 96%  Weight: 146 lb (66.2 kg)  Height: _0  (1.651 m)   Body mass index is 24.3 kg/m.  Gen: resting comfortably, no acute distress HEENT: no scleral icterus,  pupils equal round and reactive, no palptable cervical adenopathy,  CV: RRR, no m/r/g no jvd Resp: Clear to auscultation bilaterally GI: abdomen is soft, non-tender, non-distended, normal bowel sounds, no hepatosplenomegaly MSK: extremities are warm, no edema.  Skin: warm, no rash Neuro:  no focal deficits Psych: appropriate affect   Diagnostic Studies  06/2014 Echo Study Conclusions  - Left ventricle: The cavity size was normal. Wall thickness was increased in a pattern of mild LVH. Systolic function was normal. The estimated ejection fraction was in the range of 60% to 65%. Wall motion was normal; there were no regional wall motion abnormalities. Features are consistent with a pseudonormal left ventricular filling pattern, with concomitant abnormal relaxation and increased filling pressure (grade 2 diastolic dysfunction). - Aortic valve: There was trivial regurgitation. - Mitral valve: Calcified annulus. There was mild regurgitation. - Left atrium: The atrium was moderately dilated. - Right atrium: Central venous pressure (est): 3 mm Hg. - Tricuspid valve: There was trivial regurgitation. - Pulmonary arteries: Systolic pressure could not be accurately estimated. - Pericardium, extracardiac: There was no pericardial effusion.  Impressions:  - Mild LVH with LVEF 38-18%, grade 2 diastolic dysfunction. Moderate left atrial enlargement. MAC with mild mitral regurgitation. Unable to assess PASP.     Assessment and Plan    1. Palpitations - no symptoms, continue coreg - EKG shows NSR   2. HTN - low bp's last few visits, have been weaning her bp meds  Lower lisinopril to 2.53m daily.    3. Hyperlipidemia - request pcp labs, continue current meds   4. LE edema -doing well, continue prn lasix   F/u 1 year JArnoldo Lenis M.D.,

## 2022-08-22 ENCOUNTER — Other Ambulatory Visit: Payer: Self-pay | Admitting: Cardiology

## 2022-09-02 ENCOUNTER — Emergency Department (HOSPITAL_COMMUNITY): Payer: Medicare Other

## 2022-09-02 ENCOUNTER — Emergency Department (HOSPITAL_COMMUNITY)
Admission: EM | Admit: 2022-09-02 | Discharge: 2022-09-03 | Disposition: A | Discharge status: Home / Self Care | Payer: Medicare Other | Attending: Emergency Medicine | Admitting: Emergency Medicine

## 2022-09-02 ENCOUNTER — Encounter (HOSPITAL_COMMUNITY): Payer: Self-pay | Admitting: Emergency Medicine

## 2022-09-02 ENCOUNTER — Other Ambulatory Visit: Payer: Self-pay

## 2022-09-02 DIAGNOSIS — Z743 Need for continuous supervision: Secondary | ICD-10-CM | POA: Diagnosis not present

## 2022-09-02 DIAGNOSIS — N390 Urinary tract infection, site not specified: Secondary | ICD-10-CM

## 2022-09-02 DIAGNOSIS — E162 Hypoglycemia, unspecified: Secondary | ICD-10-CM | POA: Diagnosis not present

## 2022-09-02 DIAGNOSIS — R42 Dizziness and giddiness: Secondary | ICD-10-CM | POA: Diagnosis not present

## 2022-09-02 DIAGNOSIS — R4781 Slurred speech: Secondary | ICD-10-CM | POA: Insufficient documentation

## 2022-09-02 DIAGNOSIS — I10 Essential (primary) hypertension: Secondary | ICD-10-CM | POA: Diagnosis not present

## 2022-09-02 DIAGNOSIS — R410 Disorientation, unspecified: Secondary | ICD-10-CM | POA: Insufficient documentation

## 2022-09-02 DIAGNOSIS — Z7982 Long term (current) use of aspirin: Secondary | ICD-10-CM | POA: Diagnosis not present

## 2022-09-02 DIAGNOSIS — R4182 Altered mental status, unspecified: Secondary | ICD-10-CM | POA: Insufficient documentation

## 2022-09-02 DIAGNOSIS — E11649 Type 2 diabetes mellitus with hypoglycemia without coma: Secondary | ICD-10-CM | POA: Diagnosis not present

## 2022-09-02 DIAGNOSIS — E161 Other hypoglycemia: Secondary | ICD-10-CM | POA: Diagnosis not present

## 2022-09-02 DIAGNOSIS — U071 COVID-19: Secondary | ICD-10-CM | POA: Diagnosis not present

## 2022-09-02 DIAGNOSIS — R531 Weakness: Secondary | ICD-10-CM | POA: Diagnosis not present

## 2022-09-02 LAB — CBC WITH DIFFERENTIAL/PLATELET
Abs Immature Granulocytes: 0.02 10*3/uL (ref 0.00–0.07)
Basophils Absolute: 0 10*3/uL (ref 0.0–0.1)
Basophils Relative: 1 %
Eosinophils Absolute: 0 10*3/uL (ref 0.0–0.5)
Eosinophils Relative: 0 %
HCT: 38.7 % (ref 36.0–46.0)
Hemoglobin: 12.6 g/dL (ref 12.0–15.0)
Immature Granulocytes: 0 %
Lymphocytes Relative: 16 %
Lymphs Abs: 1.2 10*3/uL (ref 0.7–4.0)
MCH: 28.8 pg (ref 26.0–34.0)
MCHC: 32.6 g/dL (ref 30.0–36.0)
MCV: 88.4 fL (ref 80.0–100.0)
Monocytes Absolute: 0.9 10*3/uL (ref 0.1–1.0)
Monocytes Relative: 13 %
Neutro Abs: 5.1 10*3/uL (ref 1.7–7.7)
Neutrophils Relative %: 70 %
Platelets: 200 10*3/uL (ref 150–400)
RBC: 4.38 MIL/uL (ref 3.87–5.11)
RDW: 15.3 % (ref 11.5–15.5)
WBC: 7.3 10*3/uL (ref 4.0–10.5)
nRBC: 0 % (ref 0.0–0.2)

## 2022-09-02 LAB — URINALYSIS, ROUTINE W REFLEX MICROSCOPIC
Bilirubin Urine: NEGATIVE
Glucose, UA: >=500 mg/dL — AB
Ketones, ur: 5 mg/dL — AB
Nitrite: POSITIVE — AB
Protein, ur: 30 mg/dL — AB
Specific Gravity, Urine: 1.012 (ref 1.005–1.030)
WBC, UA: >50 WBC/hpf — ABNORMAL HIGH (ref 0–5)
pH: 6 (ref 5.0–8.0)

## 2022-09-02 LAB — COMPREHENSIVE METABOLIC PANEL
ALT: 25 U/L (ref 0–44)
AST: 33 U/L (ref 15–41)
Albumin: 3.2 g/dL — ABNORMAL LOW (ref 3.5–5.0)
Alkaline Phosphatase: 30 U/L — ABNORMAL LOW (ref 38–126)
Anion gap: 8 (ref 5–15)
BUN: 13 mg/dL (ref 8–23)
CO2: 26 mmol/L (ref 22–32)
Calcium: 8.1 mg/dL — ABNORMAL LOW (ref 8.9–10.3)
Chloride: 103 mmol/L (ref 98–111)
Creatinine, Ser: 0.65 mg/dL (ref 0.44–1.00)
GFR, Estimated: >60 mL/min (ref >60)
Glucose, Bld: 60 mg/dL — ABNORMAL LOW (ref 70–99)
Potassium: 3.6 mmol/L (ref 3.5–5.1)
Sodium: 137 mmol/L (ref 135–145)
Total Bilirubin: 0.7 mg/dL (ref 0.3–1.2)
Total Protein: 6.5 g/dL (ref 6.5–8.1)

## 2022-09-02 LAB — CBG MONITORING, ED
Glucose-Capillary: 58 mg/dL — ABNORMAL LOW (ref 70–99)
Glucose-Capillary: 120 mg/dL — ABNORMAL HIGH (ref 70–99)

## 2022-09-02 MED ORDER — DEXTROSE 50 % IV SOLN
INTRAVENOUS | Status: AC
  Administered 2022-09-02: 50 mL
  Filled 2022-09-02: qty 50

## 2022-09-02 MED ORDER — SODIUM CHLORIDE 0.9 % IV BOLUS
1000.0000 mL | Freq: Once | INTRAVENOUS | Status: AC
  Administered 2022-09-02: 1000 mL via INTRAVENOUS

## 2022-09-02 MED ORDER — SODIUM CHLORIDE 0.9 % IV BOLUS
500.0000 mL | Freq: Once | INTRAVENOUS | Status: AC
  Administered 2022-09-02: 500 mL via INTRAVENOUS

## 2022-09-02 NOTE — ED Notes (Signed)
Pt hooked up to cardiac monitor

## 2022-09-02 NOTE — ED Provider Notes (Signed)
  Provider Note MRN:  [id number]  Arrival date & time: 09/03/22    ED Course and Medical Decision Making  Assumed care from Dr. Roderic Palau at shift change.  Confusion, COVID test positive at home today, not eating, hypoglycemic, reassuring neurological exam at this time, will need reassessment.  1 AM update: Patient is sitting up awake and alert in bed, daughter is at bedside who is a Marine scientist and daughter is happy to see patient back at her baseline.  Testing has revealed positive COVID test, also possibly a urinary tract infection based on the urinalysis.  We discussed options, including admission versus discharge.  Daughter is comfortable keeping a close eye on her at home on oral antibiotics, treatment for COVID.  Strict return precautions.  Procedures  Final Clinical Impressions(s) / ED Diagnoses     ICD-10-CM   1. Urinary tract infection without hematuria, site unspecified  N39.0     2. COVID-19  U07.1     3. Confusion  R41.0     4. Low blood sugar  E16.2       ED Discharge Orders          Ordered    cephALEXin (KEFLEX) 500 MG capsule  3 times daily        09/03/22 0118    molnupiravir EUA (LAGEVRIO) 200 MG CAPS capsule  2 times daily        09/03/22 0118              Discharge Instructions      You were evaluated in the Emergency Department and after careful evaluation, we did not find any emergent condition requiring admission or further testing in the hospital.  Your exam/testing today is overall reassuring.  It appears that you have a urinary tract infection.  Please take the Keflex antibiotic as directed.  You also tested positive for COVID-19.  Recommend taking the molnupiravir as directed to help prevent more significant disease.  Isolate at home per [facility] recommendations.  Important that you remember to eat and drink to keep up your blood sugar.  Please return to the Emergency Department if you experience any worsening of your condition.   Thank you for allowing [place]  to be a part of your care.      Barth Kirks. Sedonia Small, [place] [email or web address]    Maudie Flakes, MD 09/03/22 Areta Haber

## 2022-09-02 NOTE — ED Notes (Signed)
  Dr Roderic Palau at bedside.  Patient has tolerated about 6 oz of OJ.  Given verbal for D50 amp.  Given to patient and tolerated well.

## 2022-09-02 NOTE — ED Triage Notes (Addendum)
Pt brought in by EMS after family called stating they found pt with AMS. States pt normally alert and oriented, drives self, and is self ambulatory. EMS states pt appeared to have aphasia to them and a very unsteady gait. Pt's LKW was yesterday. Blood glucose was 60 when checked by EMS and pt was given 12.'5mg'$  D50 IV PTA. Dr Roderic Palau notified. Pt's family also told EMS that pt tested + for Covid via home test.

## 2022-09-02 NOTE — ED Notes (Signed)
  Patient CBG 58 on arrival.  Patient A&O x4 and able to drink PO fluids.  Gave patient 10 oz of orange juice.  Will recheck CBG in 20 minutes.

## 2022-09-02 NOTE — ED Provider Notes (Signed)
[facility] EMERGENCY DEPARTMENT Provider Note   CSN: [id number] Arrival date & time: 09/02/22  2055     History {Add pertinent medical, surgical, social history, OB history to HPI:1} Chief Complaint  Patient presents with   Altered Mental Status    [name] White is a 81 y.o. female.  Patient has had a virus for the last couple days.  She had a COVID test done yesterday at home that was positive.  Her family checked on her today and she was confused and had slurred speech.  Patient's sugar was shown to be 58 and she had improved with glucose and orange juice by time she came to the emergency department   Altered Mental Status      Home Medications Prior to Admission medications   Medication Sig Start Date End Date Taking? Authorizing Provider  ACCU-CHEK AVIVA PLUS test strip TEST TWICE DAILY. 01/14/20   Corum, Rex Kras, MD  albuterol (PROVENTIL HFA;VENTOLIN HFA) 108 (90 BASE) MCG/ACT inhaler Inhale 2 puffs into the lungs every 4 (four) hours as needed for wheezing or shortness of breath.    [provider]  ALPRAZolam Duanne Moron) 0.25 MG tablet Take 0.25 mg by mouth daily as needed. 06/17/22   [provider]  aspirin 81 MG tablet Take 81 mg by mouth daily.    [provider]  atorvastatin (LIPITOR) 40 MG tablet TAKE ONE (1) TABLET BY MOUTH EVERY DAY 09/10/21   Arnoldo Lenis, MD  baclofen (LIORESAL) 10 MG tablet Take 10 mg by mouth daily.    [provider]  BREO ELLIPTA 200-25 MCG/INH AEPB Inhale 1 puff into the lungs daily.  09/08/18   [provider]  carvedilol (COREG) 3.125 MG tablet TAKE ONE TABLET BY MOUTH TWICE A DAY 08/22/22   Arnoldo Lenis, MD  cycloSPORINE (RESTASIS) 0.05 % ophthalmic emulsion Place 1 drop into both eyes 2 (two) times daily.    [provider]  dapagliflozin propanediol (FARXIGA) 5 MG TABS tablet Take 2.5 mg by mouth daily before breakfast. Patient taking differently: Take 5 mg by mouth daily before  breakfast. 12/26/19   Renato Shin, MD  docusate sodium (COLACE) 100 MG capsule Take 1 capsule (100 mg total) by mouth 2 (two) times daily. 06/06/20   Vallarie Mare, MD  escitalopram (LEXAPRO) 20 MG tablet Take 20 mg by mouth daily. 08/11/22   [provider]  estradiol (ESTRACE) 1 MG tablet TAKE ONE TABLET ('1MG'$  TOTAL) BY MOUTH DAILY Patient taking differently: Take 1 mg by mouth daily. 02/19/20   Jonnie Kind, MD  fluticasone (FLONASE) 50 MCG/ACT nasal spray Place 2 sprays into the nose at bedtime. 09/04/20 08/13/79  [provider]  furosemide (LASIX) 20 MG tablet May take Lasix 20 mg as NEEDED for leg swelling 03/13/15   Arnoldo Lenis, MD  HYDROcodone-acetaminophen (NORCO/VICODIN) 5-325 MG tablet Take 1 tablet by mouth every 6 (six) hours as needed for moderate pain.     [provider]  levothyroxine (SYNTHROID) 75 MCG tablet Take 75 mcg by mouth daily. 08/01/22   [provider]  lisinopril (ZESTRIL) 2.5 MG tablet Take 1 tablet (2.5 mg total) by mouth daily. 08/12/22 08/07/23  Arnoldo Lenis, MD  metFORMIN (GLUCOPHAGE-XR) 500 MG 24 hr tablet TAKE TWO (2) TABLETS BY MOUTH DAILY 11/30/21   Renato Shin, MD  methocarbamol (ROBAXIN) 500 MG tablet Take 1 tablet (500 mg total) by mouth 2 (two) times daily. 04/02/20   Margarita Mail, PA-C  Multiple Vitamins-Minerals (MULTIVITAMINS THER. W/MINERALS) TABS Take 1 tablet by mouth every morning.    [provider]  omeprazole (PRILOSEC) 20 MG capsule Take 20 mg by mouth every morning.    [provider]  pregabalin (LYRICA) 150 MG capsule TAKE ONE CAPSULE (150 MG TOTAL) BY MOUTHTWO TIMES DAILY. 06/27/20   Jonnie Kind, MD  repaglinide (PRANDIN) 1 MG tablet TAKE ONE TABLET BY MOUTH WITH BREAKFAST,4 TABLETS WITH LUNCH, AND 2 TABLETS [facility] 03/10/22   Renato Shin, MD  rizatriptan (MAXALT-MLT) 10 MG disintegrating tablet Take 10 mg by mouth as needed for migraine. May repeat in 2 hours if  needed    [provider]  traMADol (ULTRAM) 50 MG tablet Take 1 tablet (50 mg total) by mouth every 6 (six) hours as needed. 04/02/20   Margarita Mail, PA-C  UNIFINE PENTIPS 31G X 6 MM MISC USE TO INJECT VICTOZA ONCE DAILY 01/10/22   Renato Shin, MD  venlafaxine (EFFEXOR-XR) 150 MG 24 hr capsule Take 150 mg by mouth every morning.    [provider]  Vibegron (GEMTESA) 75 MG TABS Take 75 mg by mouth at bedtime.    [provider]  VICTOZA 18 MG/3ML SOPN INJECT 0.3MLS (1.'8MG'$  TOTAL) INTO THE SKIN DAILY 03/28/22   Renato Shin, MD  VRAYLAR 1.5 MG capsule Take 1.5 mg by mouth daily. 07/05/22   [provider]      Allergies    Betadine [povidone iodine] and Penicillins    Review of Systems   Review of Systems  Physical Exam Updated Vital Signs BP (!) 91/57   Pulse 80   Temp 99.1 F (37.3 C) (Oral)   Resp (!) 25   Ht '5\' 5"'$  (1.651 m)   Wt 66.2 kg   SpO2 95%   BMI 24.30 kg/m  Physical Exam  ED Results / Procedures / Treatments   Labs (all labs ordered are listed, but only abnormal results are displayed) Labs Reviewed  COMPREHENSIVE METABOLIC PANEL - Abnormal; Notable for the following components:      Result Value   Glucose, Bld 60 (*)    Calcium 8.1 (*)    Albumin 3.2 (*)    Alkaline Phosphatase 30 (*)    All other components within normal limits  URINALYSIS, ROUTINE W REFLEX MICROSCOPIC - Abnormal; Notable for the following components:   APPearance CLOUDY (*)    Glucose, UA >=500 (*)    Hgb urine dipstick MODERATE (*)    Ketones, ur 5 (*)    Protein, ur 30 (*)    Nitrite POSITIVE (*)    Leukocytes,Ua LARGE (*)    WBC, UA >50 (*)    Bacteria, UA RARE (*)    All other components within normal limits  CBG MONITORING, ED - Abnormal; Notable for the following components:   Glucose-Capillary 58 (*)    All other components within normal limits  CBG MONITORING, ED - Abnormal; Notable for the following components:   Glucose-Capillary 120 (*)     All other components within normal limits  SARS CORONAVIRUS 2 BY RT PCR  CBC WITH DIFFERENTIAL/PLATELET    EKG EKG Interpretation  Date/Time:  Friday September 02 2022 21:06:36 EDT Ventricular Rate:  79 PR Interval:  153 QRS Duration: 93 QT Interval:  416 QTC Calculation: 477 R Axis:   74 Text Interpretation: Sinus rhythm Confirmed by Gerlene Fee [phone]) on 09/02/2022 11:44:04 PM  Radiology CT Head Wo Contrast  Result Date: 09/02/2022 CLINICAL DATA:  Altered mental status,  dizziness EXAM: CT HEAD WITHOUT CONTRAST TECHNIQUE: Contiguous axial images were obtained from the base of the skull through the vertex without intravenous contrast. RADIATION DOSE REDUCTION: This exam was performed according to the departmental dose-optimization program which includes automated exposure control, adjustment of the mA and/or kV according to patient size and/or use of iterative reconstruction technique. COMPARISON:  CT head dated 05/01/2022.  MRI brain dated 10/29/2021. FINDINGS: Brain: No evidence of acute infarction, hemorrhage, hydrocephalus, extra-axial collection or mass effect. 1.7 cm sellar/suprasellar mass (series 2/image 11), better evaluated on prior MR, stable versus mildly improved. Subcortical white matter and periventricular small vessel ischemic changes. 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 Vascular: No hyperdense vessel or unexpected calcification. Skull: Stable clival destruction.  Otherwise unremarkable. Sinuses/Orbits: The visualized paranasal sinuses are essentially clear. The mastoid air cells are unopacified. Other: None. IMPRESSION: No evidence of acute intracranial abnormality. Stable sellar/suprasellar mass with clival involvement. 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 Small vessel ischemic changes. Electronically Signed   By: Julian Hy M.D.   On: 09/02/2022 22:51    Procedures Procedures  {Document cardiac monitor, telemetry assessment procedure when appropriate:1}  Medications  Ordered in ED Medications  dextrose 50 % solution (50 mLs  Given 09/02/22 2132)  sodium chloride 0.9 % bolus 500 mL (0 mLs Intravenous Stopped 09/02/22 2320)  sodium chloride 0.9 % bolus 1,000 mL (1,000 mLs Intravenous New Bag/Given 09/02/22 2350)    ED Course/ Medical Decision Making/ A&P  Patient with altered mental status secondary to hypoglycemia.  She has not been eating much for the last couple days because she has felt ill.  Patient had a positive COVID at home.  She normally runs a low blood pressure according to the patient.                         Medical Decision Making Amount and/or Complexity of Data Reviewed Labs: ordered. Radiology: ordered.   Patient with hypoglycemia and hypotension.  She will be getting more IV fluids and COVID test pending.  Disposition will be done by my colleague  {Document critical care time when appropriate:1} {Document review of labs and clinical decision tools ie heart score, Chads2Vasc2 etc:1}  {Document your independent review of radiology images, and any outside records:1} {Document your discussion with family members, caretakers, and with consultants:1} {Document social determinants of health affecting pt's care:1} {Document your decision making why or why not admission, treatments were needed:1} Final Clinical Impression(s) / ED Diagnoses Final diagnoses:  None    Rx / DC Orders ED Discharge Orders     None

## 2022-09-03 LAB — SARS CORONAVIRUS 2 BY RT PCR: SARS Coronavirus 2 by RT PCR: POSITIVE — AB

## 2022-09-03 MED ORDER — CEPHALEXIN 500 MG PO CAPS
500.0000 mg | ORAL_CAPSULE | Freq: Once | ORAL | Status: AC
  Administered 2022-09-03: 500 mg via ORAL
  Filled 2022-09-03: qty 1

## 2022-09-03 MED ORDER — MOLNUPIRAVIR 200 MG PO CAPS: 4.0000 | ORAL_CAPSULE | Freq: Two times a day (BID) | ORAL | 0 refills | Status: AC

## 2022-09-03 MED ORDER — CEPHALEXIN 500 MG PO CAPS: 500.0000 mg | ORAL_CAPSULE | Freq: Three times a day (TID) | ORAL | 0 refills | Status: AC

## 2022-09-03 NOTE — Discharge Instructions (Signed)
You were evaluated in the Emergency Department and after careful evaluation, we did not find any emergent condition requiring admission or further testing in the hospital.  Your exam/testing today is overall reassuring.  It appears that you have a urinary tract infection.  Please take the Keflex antibiotic as directed.  You also tested positive for COVID-19.  Recommend taking the molnupiravir as directed to help prevent more significant disease.  Isolate at home per [facility] recommendations.  Important that you remember to eat and drink to keep up your blood sugar.  Please return to the Emergency Department if you experience any worsening of your condition.   Thank you for allowing [place] to be a part of your care.

## 2022-09-06 LAB — URINE CULTURE: Culture: >=100000 — AB

## 2022-09-07 ENCOUNTER — Telehealth: Payer: Self-pay

## 2022-09-07 NOTE — Telephone Encounter (Signed)
Post ED Visit - Positive Culture Follow-up  Culture report reviewed by antimicrobial stewardship pharmacist: [place] Team '[x]'$  Eliseo Gum, Pharm.D. '[]'$  Heide Guile, Pharm.D., BCPS AQ-ID '[]'$  Parks Neptune, Pharm.D., BCPS '[]'$  Alycia Rossetti, Pharm.D., BCPS '[]'$  Flowella, Pharm.D., BCPS, AAHIVP '[]'$  Legrand Como, Pharm.D., BCPS, AAHIVP '[]'$  Salome Arnt, PharmD, BCPS '[]'$  Johnnette Gourd, PharmD, BCPS '[]'$  Hughes Better, PharmD, BCPS '[]'$  Leeroy Cha, PharmD '[]'$  Laqueta Linden, PharmD, BCPS '[]'$  Albertina Parr, PharmD  [place] Team '[]'$  Leodis Sias, PharmD '[]'$  Lindell Spar, PharmD '[]'$  Royetta Asal, PharmD '[]'$  Graylin Shiver, Rph '[]'$  Rema Fendt) Glennon Mac, PharmD '[]'$  Arlyn Dunning, PharmD '[]'$  Netta Cedars, PharmD '[]'$  Dia Sitter, PharmD '[]'$  Leone Haven, PharmD '[]'$  Gretta Arab, PharmD '[]'$  Theodis Shove, PharmD '[]'$  Peggyann Juba, PharmD '[]'$  Reuel Boom, PharmD   Positive urine culture Treated with Cephalexin, organism sensitive to the same and no further patient follow-up is required at this time.  Glennon Hamilton 09/07/2022, 9:55 AM

## 2022-09-13 DIAGNOSIS — R42 Dizziness and giddiness: Secondary | ICD-10-CM | POA: Diagnosis not present

## 2022-09-13 DIAGNOSIS — Z87440 Personal history of urinary (tract) infections: Secondary | ICD-10-CM | POA: Diagnosis not present

## 2022-09-13 DIAGNOSIS — E162 Hypoglycemia, unspecified: Secondary | ICD-10-CM | POA: Diagnosis not present

## 2022-09-13 DIAGNOSIS — Z7689 Persons encountering health services in other specified circumstances: Secondary | ICD-10-CM | POA: Diagnosis not present

## 2022-10-18 ENCOUNTER — Other Ambulatory Visit: Payer: Self-pay | Admitting: Radiation Therapy

## 2022-10-20 DIAGNOSIS — E039 Hypothyroidism, unspecified: Secondary | ICD-10-CM | POA: Diagnosis not present

## 2022-10-20 DIAGNOSIS — I10 Essential (primary) hypertension: Secondary | ICD-10-CM | POA: Diagnosis not present

## 2022-10-20 DIAGNOSIS — E1165 Type 2 diabetes mellitus with hyperglycemia: Secondary | ICD-10-CM | POA: Diagnosis not present

## 2022-10-24 ENCOUNTER — Other Ambulatory Visit: Payer: Self-pay | Admitting: Cardiology

## 2022-10-25 DIAGNOSIS — D352 Benign neoplasm of pituitary gland: Secondary | ICD-10-CM | POA: Diagnosis not present

## 2022-10-25 DIAGNOSIS — Z0000 Encounter for general adult medical examination without abnormal findings: Secondary | ICD-10-CM | POA: Diagnosis not present

## 2022-10-25 DIAGNOSIS — R300 Dysuria: Secondary | ICD-10-CM | POA: Diagnosis not present

## 2022-10-25 DIAGNOSIS — E039 Hypothyroidism, unspecified: Secondary | ICD-10-CM | POA: Diagnosis not present

## 2022-10-25 DIAGNOSIS — E1165 Type 2 diabetes mellitus with hyperglycemia: Secondary | ICD-10-CM | POA: Diagnosis not present

## 2022-10-25 DIAGNOSIS — I10 Essential (primary) hypertension: Secondary | ICD-10-CM | POA: Diagnosis not present

## 2022-10-25 DIAGNOSIS — J4540 Moderate persistent asthma, uncomplicated: Secondary | ICD-10-CM | POA: Diagnosis not present

## 2022-10-25 DIAGNOSIS — E782 Mixed hyperlipidemia: Secondary | ICD-10-CM | POA: Diagnosis not present

## 2022-10-25 DIAGNOSIS — N390 Urinary tract infection, site not specified: Secondary | ICD-10-CM | POA: Diagnosis not present

## 2022-10-25 DIAGNOSIS — K219 Gastro-esophageal reflux disease without esophagitis: Secondary | ICD-10-CM | POA: Diagnosis not present

## 2022-10-28 ENCOUNTER — Ambulatory Visit
Admission: RE | Admit: 2022-10-28 | Discharge: 2022-10-28 | Disposition: A | Discharge status: Home / Self Care | Payer: Medicare Other | Source: Ambulatory Visit | Attending: Internal Medicine | Admitting: Internal Medicine

## 2022-10-28 DIAGNOSIS — C751 Malignant neoplasm of pituitary gland: Secondary | ICD-10-CM | POA: Diagnosis not present

## 2022-10-28 DIAGNOSIS — D351 Benign neoplasm of parathyroid gland: Secondary | ICD-10-CM | POA: Diagnosis not present

## 2022-10-28 DIAGNOSIS — D352 Benign neoplasm of pituitary gland: Secondary | ICD-10-CM

## 2022-10-28 MED ORDER — GADOPICLENOL 0.5 MMOL/ML IV SOLN
4.0000 mL | Freq: Once | INTRAVENOUS | Status: AC | PRN
  Administered 2022-10-28: 4 mL via INTRAVENOUS

## 2022-10-31 ENCOUNTER — Inpatient Hospital Stay: Payer: Medicare Other

## 2022-10-31 ENCOUNTER — Inpatient Hospital Stay: Payer: Medicare Other | Attending: Internal Medicine | Admitting: Internal Medicine

## 2022-10-31 VITALS — BP 116/69 | HR 79 | Temp 97.8°F | Resp 14 | Wt 141.8 lb

## 2022-10-31 DIAGNOSIS — Z7982 Long term (current) use of aspirin: Secondary | ICD-10-CM | POA: Diagnosis not present

## 2022-10-31 DIAGNOSIS — Z7985 Long-term (current) use of injectable non-insulin antidiabetic drugs: Secondary | ICD-10-CM | POA: Insufficient documentation

## 2022-10-31 DIAGNOSIS — I10 Essential (primary) hypertension: Secondary | ICD-10-CM | POA: Insufficient documentation

## 2022-10-31 DIAGNOSIS — D352 Benign neoplasm of pituitary gland: Secondary | ICD-10-CM | POA: Diagnosis not present

## 2022-10-31 DIAGNOSIS — Z7951 Long term (current) use of inhaled steroids: Secondary | ICD-10-CM | POA: Insufficient documentation

## 2022-10-31 DIAGNOSIS — Z79621 Long term (current) use of calcineurin inhibitor: Secondary | ICD-10-CM | POA: Diagnosis not present

## 2022-10-31 DIAGNOSIS — Z79899 Other long term (current) drug therapy: Secondary | ICD-10-CM | POA: Diagnosis not present

## 2022-10-31 DIAGNOSIS — E119 Type 2 diabetes mellitus without complications: Secondary | ICD-10-CM | POA: Diagnosis not present

## 2022-10-31 DIAGNOSIS — Z7984 Long term (current) use of oral hypoglycemic drugs: Secondary | ICD-10-CM | POA: Diagnosis not present

## 2022-10-31 DIAGNOSIS — E039 Hypothyroidism, unspecified: Secondary | ICD-10-CM | POA: Diagnosis not present

## 2022-10-31 NOTE — Progress Notes (Signed)
[place] at [place], [place] [address] [phone]   Interval Evaluation  Date of Service: 10/31/22 Patient Name: [name] White Patient MRN: [id number] Patient DOB: 01-Sep-1941 Provider: Ventura Sellers, MD  Identifying Statement:  [name] White is a 81 y.o. female with  pituitary   adenoma    Oncologic History: Xx/xx/99Johney Maine total resection  06/04/20: Debulking transphenoidal resection of recurrent adenoma with Dr. Ellene Route and Dr. Zada Finders 10/30/20: Completes 6 weeks IMRT to residual adenoma with Dr. Isidore Moos   Interval History: [name] White presents today for follow up after recent MRI brain.  No new or progressive changes today.  No headaches or seizures.  Maintains functional independence.  Still living with her daughter and small dog.  H+P (05/04/21) Patient presents to clinic today following her 3 months post-IMRT MRI scan.  She describes no recurrence in "tunnel vision" symptoms which had affected her for the 6 months prior to her fall and repeat surgery.  She does experience some double vision, with objects "on top of each other" worse when looking to the right.  This is also not a new complaint.  Otherwise, she complains of "blurriness" that impairs her ability to read at times, not in one particular eye or visual field.  Is followed by ophthalmology.  Denies non-visual neurologic symptoms.  Lives at home with her daughter, is functional and independent.  Medications: Current Outpatient Medications on File Prior to Visit  Medication Sig Dispense Refill   ACCU-CHEK AVIVA PLUS test strip TEST TWICE DAILY. 100 strip 0   albuterol (PROVENTIL HFA;VENTOLIN HFA) 108 (90 BASE) MCG/ACT inhaler Inhale 2 puffs into the lungs every 4 (four) hours as needed for wheezing or shortness of breath.     ALPRAZolam (XANAX) 0.25 MG tablet Take 0.25 mg by mouth daily as needed.     aspirin 81 MG tablet Take 81 mg by mouth daily.      atorvastatin (LIPITOR) 40 MG tablet TAKE ONE (1) TABLET BY MOUTH EVERY DAY 90 tablet 3   baclofen (LIORESAL) 10 MG tablet Take 10 mg by mouth daily.     BREO ELLIPTA 200-25 MCG/INH AEPB Inhale 1 puff into the lungs daily.      carvedilol (COREG) 3.125 MG tablet TAKE ONE TABLET BY MOUTH TWICE A DAY 180 tablet 3   cycloSPORINE (RESTASIS) 0.05 % ophthalmic emulsion Place 1 drop into both eyes 2 (two) times daily.     dapagliflozin propanediol (FARXIGA) 5 MG TABS tablet Take 2.5 mg by mouth daily before breakfast. (Patient taking differently: Take 5 mg by mouth daily before breakfast.) 15 tablet 11   docusate sodium (COLACE) 100 MG capsule Take 1 capsule (100 mg total) by mouth 2 (two) times daily. 10 capsule 0   escitalopram (LEXAPRO) 20 MG tablet Take 20 mg by mouth daily.     estradiol (ESTRACE) 1 MG tablet TAKE ONE TABLET ('1MG'$  TOTAL) BY MOUTH DAILY (Patient taking differently: Take 1 mg by mouth daily.) 90 tablet 1   fluticasone (FLONASE) 50 MCG/ACT nasal spray Place 2 sprays into the nose at bedtime.     furosemide (LASIX) 20 MG tablet May take Lasix 20 mg as NEEDED for leg swelling 45 tablet 3   HYDROcodone-acetaminophen (NORCO/VICODIN) 5-325 MG tablet Take 1 tablet by mouth every 6 (six) hours as needed for moderate pain.      levothyroxine (SYNTHROID) 75 MCG tablet Take 75 mcg by mouth daily.  lisinopril (ZESTRIL) 2.5 MG tablet Take 1 tablet (2.5 mg total) by mouth daily. 90 tablet 3   metFORMIN (GLUCOPHAGE-XR) 500 MG 24 hr tablet TAKE TWO (2) TABLETS BY MOUTH DAILY 180 tablet 3   methocarbamol (ROBAXIN) 500 MG tablet Take 1 tablet (500 mg total) by mouth 2 (two) times daily. 20 tablet 0   Multiple Vitamins-Minerals (MULTIVITAMINS THER. W/MINERALS) TABS Take 1 tablet by mouth every morning.     omeprazole (PRILOSEC) 20 MG capsule Take 20 mg by mouth every morning.     pregabalin (LYRICA) 150 MG capsule TAKE ONE CAPSULE (150 MG TOTAL) BY MOUTHTWO TIMES DAILY. 60 capsule 5   repaglinide  (PRANDIN) 1 MG tablet TAKE ONE TABLET BY MOUTH WITH BREAKFAST,4 TABLETS WITH LUNCH, AND 2 TABLETS WITHSUPPER 810 tablet 1   rizatriptan (MAXALT-MLT) 10 MG disintegrating tablet Take 10 mg by mouth as needed for migraine. May repeat in 2 hours if needed     traMADol (ULTRAM) 50 MG tablet Take 1 tablet (50 mg total) by mouth every 6 (six) hours as needed. 10 tablet 0   UNIFINE PENTIPS 31G X 6 MM MISC USE TO INJECT VICTOZA ONCE DAILY 100 each 2   venlafaxine (EFFEXOR-XR) 150 MG 24 hr capsule Take 150 mg by mouth every morning.     Vibegron (GEMTESA) 75 MG TABS Take 75 mg by mouth at bedtime.     VICTOZA 18 MG/3ML SOPN INJECT 0.3MLS (1.'8MG'$  TOTAL) INTO THE SKIN DAILY 27 mL 2   VRAYLAR 1.5 MG capsule Take 1.5 mg by mouth daily.     No current facility-administered medications on file prior to visit.    Allergies:  Allergies  Allergen Reactions   Betadine [Povidone Iodine] Hives   Penicillins Rash   Past Medical History:  Past Medical History:  Diagnosis Date   Anxiety    Asthma    ASYMPTOMATIC POSTMENOPAUSAL STATUS 02/09/2009   Cataract    Constipation 03/19/2013   Depression    DIABETES MELLITUS, TYPE II 09/14/2007   HYPERCHOLESTEROLEMIA 02/09/2009   HYPERTENSION 02/09/2009   HYPOTHYROIDISM 09/14/2007   MIGRAINE HEADACHE 09/14/2007   Neuropathy    OSTEOPOROSIS 02/09/2009   PANCREATITIS 09/14/2007   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   PONV (postoperative nausea and vomiting)    Rectocele 03/19/2013   Shortness of breath    SUPERFICIAL PHLEBITIS 09/14/2007   Varicose veins    Past Surgical History:  Past Surgical History:  Procedure Laterality Date   ABDOMINAL HYSTERECTOMY     ANTERIOR AND POSTERIOR REPAIR N/A 04/02/2013   Procedure: ANTERIOR (CYSTOCELE) AND POSTERIOR REPAIR (RECTOCELE);  Surgeon: Jonnie Kind, MD;  Location: [facility];  Service: Gynecology;  Laterality: N/A;   APPENDECTOMY     BRAIN SURGERY     CATARACT EXTRACTION W/PHACO  12/05/2011   Procedure: CATARACT EXTRACTION PHACO  AND INTRAOCULAR LENS PLACEMENT (IOC);  Surgeon: Tonny Branch;  Location: [facility];  Service: Ophthalmology;  Laterality: Left;  CDE:9.65   CHOLECYSTECTOMY     CRANIOTOMY N/A 06/04/2020   Procedure: Endoscopic Transphenoidal Resection of Recurrent PituitaryTumor;  Surgeon: Kristeen Miss, MD;  Location: [place];  Service: Neurosurgery;  Laterality: N/A;   CYSTOSCOPY     ENDOVENOUS ABLATION SAPHENOUS VEIN W/ LASER  11-03-2011   right greater saphenous vein   left leg done 10-2011   EYE SURGERY  98   right cataract extraction 98   LAPAROSCOPIC NISSEN FUNDOPLICATION     NM ESOPHAGEAL REFLUX  08-11-11   PITUITARY EXCISION  10/1997   POSTERIOR  REPAIR     TRANSNASAL APPROACH N/A 06/04/2020   Procedure: TRANSNASAL APPROACH;  Surgeon: Jerrell Belfast, MD;  Location: [place];  Service: ENT;  Laterality: N/A;   TRANSPHENOIDAL / TRANSNASAL HYPOPHYSECTOMY / RESECTION PITUITARY TUMOR  08-11-11   Social History:  Social History   Socioeconomic History   Marital status: Married    Spouse name: Not on file   Number of children: Not on file   Years of education: Not on file   Highest education level: Not on file  Occupational History   Occupation: Retired    Fish farm manager: RETIRED  Tobacco Use   Smoking status: Never   Smokeless tobacco: Never  Vaping Use   Vaping Use: Never used  Substance and Sexual Activity   Alcohol use: No    Alcohol/week: 0.0 standard drinks of alcohol   Drug use: No   Sexual activity: Not Currently    Birth control/protection: Surgical  Other Topics Concern   Not on file  Social History Narrative   Not on file   Social Determinants of Health   Financial Resource Strain: Not on file  Food Insecurity: Not on file  Transportation Needs: Not on file  Physical Activity: Not on file  Stress: Not on file  Social Connections: Not on file  Intimate Partner Violence: Not on file   Family History:  Family History  Problem Relation Age of Onset   Heart disease Mother    Asthma  Mother    COPD Father    Heart disease Father    Stroke Father    Asthma Daughter    Migraines Daughter    Neuropathy Son    Cancer Neg Hx    Anesthesia problems Neg Hx    Hypotension Neg Hx    Malignant hyperthermia Neg Hx    Pseudochol deficiency Neg Hx     Review of Systems: Constitutional: Doesn't report fevers, chills or abnormal weight loss Eyes: Doesn't report blurriness of vision Ears, nose, mouth, throat, and face: Doesn't report sore throat Respiratory: Doesn't report cough, dyspnea or wheezes Cardiovascular: Doesn't report palpitation, chest discomfort  Gastrointestinal:  Doesn't report nausea, constipation, diarrhea GU: Doesn't report incontinence Skin: Doesn't report skin rashes Neurological: Per HPI Musculoskeletal: Doesn't report joint pain Behavioral/Psych: Doesn't report anxiety  Physical Exam: There were no vitals filed for this visit.   KPS: 80. General: Alert, cooperative, pleasant, in no acute distress Head: Normal EENT: No conjunctival injection or scleral icterus.  Lungs: Resp effort normal Cardiac: Regular rate Abdomen: Non-distended abdomen Skin: No rashes cyanosis or petechiae. Extremities: No clubbing or edema  Neurologic Exam: Mental Status: Awake, alert, attentive to examiner. Oriented to self and environment. Language is fluent with intact comprehension.  Cranial Nerves: Visual acuity is grossly normal. Visual fields are full. Extra-ocular movements intact. No ptosis. Face is symmetric Motor: Tone and bulk are normal. Power is full in both arms and legs. Reflexes are symmetric, no pathologic reflexes present.  Sensory: Intact to light touch Gait: Normal.   Labs: I have reviewed the data as listed    Component Value Date/Time   NA 137 09/02/2022 2133   K 3.6 09/02/2022 2133   CL 103 09/02/2022 2133   CO2 26 09/02/2022 2133   GLUCOSE 60 (L) 09/02/2022 2133   BUN 13 09/02/2022 2133   CREATININE 0.65 09/02/2022 2133   CREATININE  0.72 10/03/2016 0706   CALCIUM 8.1 (L) 09/02/2022 2133   PROT 6.5 09/02/2022 2133   ALBUMIN 3.2 (L) 09/02/2022 2133  AST 33 09/02/2022 2133   ALT 25 09/02/2022 2133   ALKPHOS 30 (L) 09/02/2022 2133   BILITOT 0.7 09/02/2022 2133   GFRNONAA >60 09/02/2022 2133   GFRAA >60 06/05/2020 0606   Lab Results  Component Value Date   WBC 7.3 09/02/2022   NEUTROABS 5.1 09/02/2022   HGB 12.6 09/02/2022   HCT 38.7 09/02/2022   MCV 88.4 09/02/2022   PLT 200 09/02/2022    Imaging: [place] Clinician Interpretation: I have personally reviewed the CNS images as listed.  My interpretation, in the context of the patient's clinical presentation, is stable disease  MR BRAIN W WO CONTRAST  Result Date: 10/30/2022 CLINICAL DATA:  Follow-up pituitary tumor EXAM: MRI HEAD WITHOUT AND WITH CONTRAST TECHNIQUE: Multiplanar, multiecho pulse sequences of the brain and surrounding structures were obtained without and with intravenous contrast. CONTRAST:  4 cc of vueway intravenous COMPARISON:  10/29/2021 FINDINGS: Brain: Pituitary mass centered at the sella with suprasellar lobulation and anterior right cavernous sinus extension anterior to the anterior genu of the right ICA. Fairly extensive infiltration of the sphenoid body with superimposed chronic sinus disease. Superior cystic change at the level of the optic chiasm is stable. Unchanged mass effect on the chiasm and right prechiasmatic optic nerve. Brain atrophy which is generalized. Chronic small vessel ischemia with chronic lacunar infarct in the left centrum semiovale. Small chronic bilateral cerebellar infarcts. No acute or interval infarct, hemorrhage, or hydrocephalus. Vascular: Preserved flow voids and vascular enhancements. Skull and upper cervical spine: Normal marrow signal Sinuses/Orbits: Stable.  Bilateral cataract resection IMPRESSION: Unchanged macroadenoma at the sella, suprasellar cistern, and skull base. Unchanged mild mass effect on the optic chiasm  and right prechiasmatic optic nerve. Aging brain without interval insult. Electronically Signed   By: Jorje Guild M.D.   On: 10/30/2022 06:52      Assessment/Plan Pituitary adenoma with extrasellar extension ([name])  [name] White is clinically and radiographically stable today.  No new or progressive deficits, now 2 years removed from debulking surgery and conventional radiation.   MRI again demonstrates stability of residual adenoma, with no further encroachment into optic chiasm.  Will continue to follow with Dr. Loanne Drilling for diabetes and pituitary survey as needed.  We appreciate the opportunity to participate in the care of [name] White.   We ask that [name] White return to clinic in 12 months following next brain MRI, or sooner as needed.  All questions were answered. The patient knows to call the clinic with any problems, questions or concerns. No barriers to learning were detected.  The total time spent in the encounter was 30 minutes and more than 50% was on counseling and review of test results   Ventura Sellers, MD Medical Director of Neuro-Oncology [facility] [place] 10/31/22 11:00 AM

## 2022-11-22 DIAGNOSIS — E119 Type 2 diabetes mellitus without complications: Secondary | ICD-10-CM | POA: Diagnosis not present

## 2023-01-27 ENCOUNTER — Other Ambulatory Visit (HOSPITAL_COMMUNITY): Payer: Self-pay

## 2023-02-01 DIAGNOSIS — E1165 Type 2 diabetes mellitus with hyperglycemia: Secondary | ICD-10-CM | POA: Diagnosis not present

## 2023-02-01 DIAGNOSIS — E039 Hypothyroidism, unspecified: Secondary | ICD-10-CM | POA: Diagnosis not present

## 2023-02-01 DIAGNOSIS — I10 Essential (primary) hypertension: Secondary | ICD-10-CM | POA: Diagnosis not present

## 2023-02-07 DIAGNOSIS — J4540 Moderate persistent asthma, uncomplicated: Secondary | ICD-10-CM | POA: Diagnosis not present

## 2023-02-07 DIAGNOSIS — J45909 Unspecified asthma, uncomplicated: Secondary | ICD-10-CM | POA: Diagnosis not present

## 2023-02-07 DIAGNOSIS — D352 Benign neoplasm of pituitary gland: Secondary | ICD-10-CM | POA: Diagnosis not present

## 2023-02-07 DIAGNOSIS — K219 Gastro-esophageal reflux disease without esophagitis: Secondary | ICD-10-CM | POA: Diagnosis not present

## 2023-02-07 DIAGNOSIS — E782 Mixed hyperlipidemia: Secondary | ICD-10-CM | POA: Diagnosis not present

## 2023-02-07 DIAGNOSIS — E039 Hypothyroidism, unspecified: Secondary | ICD-10-CM | POA: Diagnosis not present

## 2023-02-07 DIAGNOSIS — Z0001 Encounter for general adult medical examination with abnormal findings: Secondary | ICD-10-CM | POA: Diagnosis not present

## 2023-02-07 DIAGNOSIS — E1165 Type 2 diabetes mellitus with hyperglycemia: Secondary | ICD-10-CM | POA: Diagnosis not present

## 2023-02-07 DIAGNOSIS — I10 Essential (primary) hypertension: Secondary | ICD-10-CM | POA: Diagnosis not present

## 2023-02-07 DIAGNOSIS — Z0000 Encounter for general adult medical examination without abnormal findings: Secondary | ICD-10-CM | POA: Diagnosis not present

## 2023-02-07 DIAGNOSIS — R809 Proteinuria, unspecified: Secondary | ICD-10-CM | POA: Diagnosis not present

## 2023-02-23 ENCOUNTER — Encounter: Payer: Self-pay | Admitting: Radiology

## 2023-02-28 ENCOUNTER — Telehealth: Payer: Self-pay

## 2023-02-28 NOTE — Telephone Encounter (Signed)
PA request received via CMM for Victoza '18MG'$ /3ML pen-injectors  PA has been submitted to [employer] and is pending determination.   Key: [id number]

## 2023-03-01 NOTE — Telephone Encounter (Signed)
PA has been DENIED due to:   Victoza is denied because it is not on your plan's Drug List (formulary). Medication authorization requires the following: (1) You need to try three (3) of these covered drugs: (a) Bydureon or Byetta*. (b) Ozempic*. (c) Trulicity*. (2) OR your doctor needs to give [place] specific medical reasons why three (3) of the covered drug(s) are not appropriate for you.

## 2023-03-02 NOTE — Telephone Encounter (Signed)
Left message for patient to call office and schedule an appointment

## 2023-03-29 NOTE — Telephone Encounter (Signed)
Per patient she has had her records transferred to Dr. Nevada Crane in [place], [place] and is being followed by him, so no longer will be coming to [facility].

## 2023-06-14 DIAGNOSIS — E039 Hypothyroidism, unspecified: Secondary | ICD-10-CM | POA: Diagnosis not present

## 2023-06-14 DIAGNOSIS — I10 Essential (primary) hypertension: Secondary | ICD-10-CM | POA: Diagnosis not present

## 2023-06-14 DIAGNOSIS — E1165 Type 2 diabetes mellitus with hyperglycemia: Secondary | ICD-10-CM | POA: Diagnosis not present

## 2023-06-20 ENCOUNTER — Ambulatory Visit (HOSPITAL_COMMUNITY)
Admission: RE | Admit: 2023-06-20 | Discharge: 2023-06-20 | Disposition: A | Discharge status: Home / Self Care | Payer: Medicare Other | Source: Ambulatory Visit | Attending: Family Medicine | Admitting: Family Medicine

## 2023-06-20 ENCOUNTER — Other Ambulatory Visit (HOSPITAL_COMMUNITY): Payer: Self-pay | Admitting: Family Medicine

## 2023-06-20 DIAGNOSIS — N390 Urinary tract infection, site not specified: Secondary | ICD-10-CM | POA: Diagnosis not present

## 2023-06-20 DIAGNOSIS — J4540 Moderate persistent asthma, uncomplicated: Secondary | ICD-10-CM | POA: Diagnosis not present

## 2023-06-20 DIAGNOSIS — I10 Essential (primary) hypertension: Secondary | ICD-10-CM | POA: Diagnosis not present

## 2023-06-20 DIAGNOSIS — M4312 Spondylolisthesis, cervical region: Secondary | ICD-10-CM | POA: Diagnosis not present

## 2023-06-20 DIAGNOSIS — Z0000 Encounter for general adult medical examination without abnormal findings: Secondary | ICD-10-CM | POA: Diagnosis not present

## 2023-06-20 DIAGNOSIS — E782 Mixed hyperlipidemia: Secondary | ICD-10-CM | POA: Diagnosis not present

## 2023-06-20 DIAGNOSIS — M50322 Other cervical disc degeneration at C5-C6 level: Secondary | ICD-10-CM | POA: Diagnosis not present

## 2023-06-20 DIAGNOSIS — J45909 Unspecified asthma, uncomplicated: Secondary | ICD-10-CM | POA: Diagnosis not present

## 2023-06-20 DIAGNOSIS — E039 Hypothyroidism, unspecified: Secondary | ICD-10-CM | POA: Diagnosis not present

## 2023-06-20 DIAGNOSIS — M47812 Spondylosis without myelopathy or radiculopathy, cervical region: Secondary | ICD-10-CM | POA: Diagnosis not present

## 2023-06-20 DIAGNOSIS — M542 Cervicalgia: Secondary | ICD-10-CM | POA: Insufficient documentation

## 2023-06-20 DIAGNOSIS — D352 Benign neoplasm of pituitary gland: Secondary | ICD-10-CM | POA: Diagnosis not present

## 2023-06-20 DIAGNOSIS — K219 Gastro-esophageal reflux disease without esophagitis: Secondary | ICD-10-CM | POA: Diagnosis not present

## 2023-06-20 DIAGNOSIS — E1165 Type 2 diabetes mellitus with hyperglycemia: Secondary | ICD-10-CM | POA: Diagnosis not present

## 2023-08-22 ENCOUNTER — Other Ambulatory Visit: Payer: Self-pay | Admitting: Cardiology

## 2023-09-25 ENCOUNTER — Other Ambulatory Visit: Payer: Self-pay | Admitting: Cardiology

## 2023-10-16 ENCOUNTER — Other Ambulatory Visit: Payer: Self-pay | Admitting: Cardiology

## 2023-10-18 DIAGNOSIS — E1165 Type 2 diabetes mellitus with hyperglycemia: Secondary | ICD-10-CM | POA: Diagnosis not present

## 2023-10-18 DIAGNOSIS — E039 Hypothyroidism, unspecified: Secondary | ICD-10-CM | POA: Diagnosis not present

## 2023-10-18 DIAGNOSIS — I10 Essential (primary) hypertension: Secondary | ICD-10-CM | POA: Diagnosis not present

## 2023-10-24 ENCOUNTER — Other Ambulatory Visit (HOSPITAL_COMMUNITY): Payer: Self-pay | Admitting: Family Medicine

## 2023-10-24 DIAGNOSIS — I700 Atherosclerosis of aorta: Secondary | ICD-10-CM | POA: Diagnosis not present

## 2023-10-24 DIAGNOSIS — D352 Benign neoplasm of pituitary gland: Secondary | ICD-10-CM | POA: Diagnosis not present

## 2023-10-24 DIAGNOSIS — I10 Essential (primary) hypertension: Secondary | ICD-10-CM | POA: Diagnosis not present

## 2023-10-24 DIAGNOSIS — Z23 Encounter for immunization: Secondary | ICD-10-CM | POA: Diagnosis not present

## 2023-10-24 DIAGNOSIS — J45909 Unspecified asthma, uncomplicated: Secondary | ICD-10-CM | POA: Diagnosis not present

## 2023-10-24 DIAGNOSIS — E1165 Type 2 diabetes mellitus with hyperglycemia: Secondary | ICD-10-CM | POA: Diagnosis not present

## 2023-10-24 DIAGNOSIS — K219 Gastro-esophageal reflux disease without esophagitis: Secondary | ICD-10-CM | POA: Diagnosis not present

## 2023-10-24 DIAGNOSIS — J4540 Moderate persistent asthma, uncomplicated: Secondary | ICD-10-CM | POA: Diagnosis not present

## 2023-10-24 DIAGNOSIS — E039 Hypothyroidism, unspecified: Secondary | ICD-10-CM | POA: Diagnosis not present

## 2023-10-24 DIAGNOSIS — E782 Mixed hyperlipidemia: Secondary | ICD-10-CM | POA: Diagnosis not present

## 2023-10-24 DIAGNOSIS — Z13820 Encounter for screening for osteoporosis: Secondary | ICD-10-CM

## 2023-10-26 ENCOUNTER — Ambulatory Visit
Admission: RE | Admit: 2023-10-26 | Discharge: 2023-10-26 | Disposition: A | Discharge status: Home / Self Care | Payer: Medicare Other | Source: Ambulatory Visit | Attending: Internal Medicine | Admitting: Internal Medicine

## 2023-10-26 DIAGNOSIS — D352 Benign neoplasm of pituitary gland: Secondary | ICD-10-CM

## 2023-10-26 DIAGNOSIS — C719 Malignant neoplasm of brain, unspecified: Secondary | ICD-10-CM | POA: Diagnosis not present

## 2023-10-26 MED ORDER — GADOPICLENOL 0.5 MMOL/ML IV SOLN
6.0000 mL | Freq: Once | INTRAVENOUS | Status: AC | PRN
Start: 2023-10-26 — End: 2023-10-26
  Administered 2023-10-26: 6 mL via INTRAVENOUS

## 2023-10-27 ENCOUNTER — Ambulatory Visit: Payer: Medicare Other | Attending: Medical | Admitting: Medical

## 2023-10-27 ENCOUNTER — Encounter: Payer: Self-pay | Admitting: Medical

## 2023-10-27 VITALS — BP 95/54 | HR 84 | Ht 66.0 in | Wt 144.0 lb

## 2023-10-27 DIAGNOSIS — I872 Venous insufficiency (chronic) (peripheral): Secondary | ICD-10-CM | POA: Diagnosis not present

## 2023-10-27 DIAGNOSIS — R002 Palpitations: Secondary | ICD-10-CM

## 2023-10-27 DIAGNOSIS — I10 Essential (primary) hypertension: Secondary | ICD-10-CM | POA: Diagnosis not present

## 2023-10-27 DIAGNOSIS — E782 Mixed hyperlipidemia: Secondary | ICD-10-CM | POA: Diagnosis not present

## 2023-10-27 NOTE — Patient Instructions (Addendum)
Medication Instructions:  STOP Lisinopril  Labwork: None today  Testing/Procedures: None today  Follow-Up: 1 year Dr.Branch  Any Other Special Instructions Will Be Listed Below (If Applicable).  If you need a refill on your cardiac medications before your next appointment, please call your pharmacy.

## 2023-10-27 NOTE — Progress Notes (Signed)
Cardiology Office Note:    Date:  10/27/2023   ID:  MAEBRY OBRIEN, DOB 1941-09-06, MRN [id number]  PCP:  Benita Stabile, MD  [facility] Cardiologist:  Dina Rich, MD  [facility] Electrophysiologist:  None   Referring MD: Benita Stabile, MD   Chief Complaint: 1 year follow-up  History of Present Illness:    MACLOVIA UHER is a 82 y.o. female with a hx of palpitations, HTN, HLD, pituitary adenoma s/p resection, DM2, chronic venous insufficiency who presents for 1 year follow-up.  Echo in 2015 showed LVEF 60-65%, G2DD. The patient takes lasix for chronic lower leg edema.   The patient was last seen 07/2022 and BP meds were being weaned down.   Today, the patient reports she has been overall OK. No significant changes over the last year. She sees PCP regularly. She denies chest pain. She has minimal exertional SOB. She says she gets woozy sometimes when she walks. She denies any recent falls. She denies heart racing or flutter. No lower leg edema, orthopnea or pnd. BP is low today, she does not check BP at home. She is eating and drinking Ok. She lives by herself. She has help close by if she needs it. PDP does labs.   Past Medical History:  Diagnosis Date   Anxiety    Asthma    ASYMPTOMATIC POSTMENOPAUSAL STATUS 02/09/2009   Cataract    Constipation 03/19/2013   Depression    DIABETES MELLITUS, TYPE II 09/14/2007   HYPERCHOLESTEROLEMIA 02/09/2009   HYPERTENSION 02/09/2009   HYPOTHYROIDISM 09/14/2007   MIGRAINE HEADACHE 09/14/2007   Neuropathy    OSTEOPOROSIS 02/09/2009   PANCREATITIS 09/14/2007   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   PONV (postoperative nausea and vomiting)    Rectocele 03/19/2013   Shortness of breath    SUPERFICIAL PHLEBITIS 09/14/2007   Varicose veins     Past Surgical History:  Procedure Laterality Date   ABDOMINAL HYSTERECTOMY     ANTERIOR AND POSTERIOR REPAIR N/A 04/02/2013   Procedure: ANTERIOR (CYSTOCELE) AND POSTERIOR REPAIR (RECTOCELE);  Surgeon: Tilda Burrow, MD;  Location: [facility];  Service: Gynecology;  Laterality: N/A;   APPENDECTOMY     BRAIN SURGERY     CATARACT EXTRACTION W/PHACO  12/05/2011   Procedure: CATARACT EXTRACTION PHACO AND INTRAOCULAR LENS PLACEMENT (IOC);  Surgeon: Gemma Payor;  Location: [facility];  Service: Ophthalmology;  Laterality: Left;  CDE:9.65   CHOLECYSTECTOMY     CRANIOTOMY N/A 06/04/2020   Procedure: Endoscopic Transphenoidal Resection of Recurrent PituitaryTumor;  Surgeon: Barnett Abu, MD;  Location: [facility];  Service: Neurosurgery;  Laterality: N/A;   CYSTOSCOPY     ENDOVENOUS ABLATION SAPHENOUS VEIN W/ LASER  11-03-2011   right greater saphenous vein   left leg done 10-2011   EYE SURGERY  98   right cataract extraction 98   LAPAROSCOPIC NISSEN FUNDOPLICATION     NM ESOPHAGEAL REFLUX  08-11-11   PITUITARY EXCISION  10/1997   POSTERIOR REPAIR     TRANSNASAL APPROACH N/A 06/04/2020   Procedure: TRANSNASAL APPROACH;  Surgeon: Osborn Coho, MD;  Location: [facility] OR;  Service: ENT;  Laterality: N/A;   TRANSPHENOIDAL / TRANSNASAL HYPOPHYSECTOMY / RESECTION PITUITARY TUMOR  08-11-11    Current Medications: Current Meds  Medication Sig   ACCU-CHEK AVIVA PLUS test strip TEST TWICE DAILY.   albuterol (PROVENTIL HFA;VENTOLIN HFA) 108 (90 BASE) MCG/ACT inhaler Inhale 2 puffs into the lungs every 4 (four) hours as needed for wheezing or shortness of breath.  ALPRAZolam (XANAX) 0.25 MG tablet Take 0.25 mg by mouth daily as needed.   atorvastatin (LIPITOR) 40 MG tablet TAKE ONE (1) TABLET BY MOUTH EVERY DAY   baclofen (LIORESAL) 10 MG tablet Take 10 mg by mouth daily.   BREO ELLIPTA 200-25 MCG/INH AEPB Inhale 1 puff into the lungs daily.    carvedilol (COREG) 3.125 MG tablet TAKE ONE TABLET BY MOUTH TWICE A DAY   cycloSPORINE (RESTASIS) 0.05 % ophthalmic emulsion Place 1 drop into both eyes 2 (two) times daily.   dapagliflozin propanediol (FARXIGA) 5 MG TABS tablet Take 2.5 mg by mouth daily before breakfast. (Patient  taking differently: Take 5 mg by mouth daily before breakfast.)   docusate sodium (COLACE) 100 MG capsule Take 1 capsule (100 mg total) by mouth 2 (two) times daily.   escitalopram (LEXAPRO) 20 MG tablet Take 20 mg by mouth daily.   estradiol (ESTRACE) 1 MG tablet TAKE ONE TABLET (1MG  TOTAL) BY MOUTH DAILY (Patient taking differently: Take 1 mg by mouth daily.)   fluticasone (FLONASE) 50 MCG/ACT nasal spray Place 2 sprays into the nose at bedtime.   furosemide (LASIX) 20 MG tablet May take Lasix 20 mg as NEEDED for leg swelling   HYDROcodone-acetaminophen (NORCO/VICODIN) 5-325 MG tablet Take 1 tablet by mouth every 6 (six) hours as needed for moderate pain.    levothyroxine (SYNTHROID) 75 MCG tablet Take 75 mcg by mouth daily.   metFORMIN (GLUCOPHAGE-XR) 500 MG 24 hr tablet TAKE TWO (2) TABLETS BY MOUTH DAILY   methocarbamol (ROBAXIN) 500 MG tablet Take 1 tablet (500 mg total) by mouth 2 (two) times daily.   Multiple Vitamins-Minerals (MULTIVITAMINS THER. W/MINERALS) TABS Take 1 tablet by mouth every morning.   omeprazole (PRILOSEC) 20 MG capsule Take 20 mg by mouth every morning.   pregabalin (LYRICA) 150 MG capsule TAKE ONE CAPSULE (150 MG TOTAL) BY MOUTHTWO TIMES DAILY.   repaglinide (PRANDIN) 1 MG tablet TAKE ONE TABLET BY MOUTH WITH BREAKFAST,4 TABLETS WITH LUNCH, AND 2 TABLETS WITHSUPPER   rizatriptan (MAXALT-MLT) 10 MG disintegrating tablet Take 10 mg by mouth as needed for migraine. May repeat in 2 hours if needed   traMADol (ULTRAM) 50 MG tablet Take 1 tablet (50 mg total) by mouth every 6 (six) hours as needed.   TRINTELLIX 10 MG TABS tablet Take 10 mg by mouth daily.   UNIFINE PENTIPS 31G X 6 MM MISC USE TO INJECT VICTOZA ONCE DAILY   venlafaxine (EFFEXOR-XR) 150 MG 24 hr capsule Take 150 mg by mouth every morning.   Vibegron (GEMTESA) 75 MG TABS Take 75 mg by mouth at bedtime.   VICTOZA 18 MG/3ML SOPN INJECT 0.3MLS (1.8MG  TOTAL) INTO THE SKIN DAILY   [DISCONTINUED] lisinopril  (ZESTRIL) 2.5 MG tablet TAKE ONE TABLET (2.5 MG TOTAL) BY MOUTH DAILY.     Allergies:   Betadine [povidone iodine] and Penicillins   Social History   Socioeconomic History   Marital status: Married    Spouse name: Not on file   Number of children: Not on file   Years of education: Not on file   Highest education level: Not on file  Occupational History   Occupation: Retired    Associate Professor: RETIRED  Tobacco Use   Smoking status: Never   Smokeless tobacco: Never  Vaping Use   Vaping status: Never Used  Substance and Sexual Activity   Alcohol use: No    Alcohol/week: 0.0 standard drinks of alcohol   Drug use: No   Sexual activity: Not  Currently    Birth control/protection: Surgical  Other Topics Concern   Not on file  Social History Narrative   Not on file   Social Determinants of Health   Financial Resource Strain: Not on file  Food Insecurity: Not on file  Transportation Needs: Not on file  Physical Activity: Not on file  Stress: Not on file  Social Connections: Not on file     Family History: The patient's family history includes Asthma in her daughter and mother; COPD in her father; Heart disease in her father and mother; Migraines in her daughter; Neuropathy in her son; Stroke in her father. There is no history of Cancer, Anesthesia problems, Hypotension, Malignant hyperthermia, or Pseudochol deficiency.  ROS:   Please see the history of present illness.     All other systems reviewed and are negative.  EKGs/Labs/Other Studies Reviewed:    The following studies were reviewed today:   06/2014 Echo Study Conclusions  - Left ventricle: The cavity size was normal. Wall thickness was increased in a pattern of mild LVH. Systolic function was normal. The estimated ejection fraction was in the range of 60% to 65%. Wall motion was normal; there were no regional wall motion abnormalities. Features are consistent with a pseudonormal left ventricular filling pattern,  with concomitant abnormal relaxation and increased filling pressure (grade 2 diastolic dysfunction). - Aortic valve: There was trivial regurgitation. - Mitral valve: Calcified annulus. There was mild regurgitation. - Left atrium: The atrium was moderately dilated. - Right atrium: Central venous pressure (est): 3 mm Hg. - Tricuspid valve: There was trivial regurgitation. - Pulmonary arteries: Systolic pressure could not be accurately estimated. - Pericardium, extracardiac: There was no pericardial effusion.  Impressions:  - Mild LVH with LVEF 60-65%, grade 2 diastolic dysfunction. Moderate left atrial enlargement. MAC with mild mitral regurgitation. Unable to assess PASP.  EKG:  EKG is ordered today.  The ekg ordered today demonstrates NSR 84bpm, nonspecific t wave changes  Recent Labs: No results found for requested labs within last 365 days.  Recent Lipid Panel    Component Value Date/Time   CHOL 123 07/22/2019 0913   TRIG 100 07/22/2019 0913   TRIG 119 10/20/2006 1116   HDL 47 07/22/2019 0913   CHOLHDL 2.6 07/22/2019 0913   VLDL 20 07/22/2019 0913   LDLCALC 56 07/22/2019 0913    Physical Exam:    VS:  BP (!) 95/54   Pulse 84   Ht 5\' 6"  (1.676 m)   Wt 144 lb (65.3 kg)   SpO2 100%   BMI 23.24 kg/m     Wt Readings from Last 3 Encounters:  10/27/23 144 lb (65.3 kg)  10/31/22 141 lb 12.8 oz (64.3 kg)  09/02/22 146 lb (66.2 kg)     GEN:  Well nourished, well developed in no acute distress HEENT: Normal NECK: No JVD; No carotid bruits LYMPHATICS: No lymphadenopathy CARDIAC: RRR, no murmurs, rubs, gallops RESPIRATORY:  Clear to auscultation without rales, wheezing or rhonchi  ABDOMEN: Soft, non-tender, non-distended MUSCULOSKELETAL:  No edema; No deformity  SKIN: Warm and dry NEUROLOGIC:  Alert and oriented x 3 PSYCHIATRIC:  Normal affect   ASSESSMENT:    1. Heart palpitations   2. Essential hypertension   3. Hyperlipidemia, mixed   4. Venous insufficiency     PLAN:    In order of problems listed above:  Palpitations EKG shows NSR. She denies palpitations. Continue low dose Coreg.   HTN BP is low today, I will stop Lisinopril.  I recommended she check BP at home.   HLD I will request labs from PCP. Continue Lipitor 40mg  daily.   Chronic venous insufficiency She takes lasix as needed for lower leg edema.Patient rarely needs lasix.    Disposition: Follow up in 1 year(s) with MD/APP    Signed, Amiliana Foutz David Stall, PA-C  10/27/2023 1:27 PM    [facility]

## 2023-10-30 ENCOUNTER — Inpatient Hospital Stay: Payer: Medicare Other | Attending: Internal Medicine | Admitting: Internal Medicine

## 2023-10-30 VITALS — BP 101/63 | HR 79 | Temp 97.9°F | Resp 18 | Wt 133.7 lb

## 2023-10-30 DIAGNOSIS — I10 Essential (primary) hypertension: Secondary | ICD-10-CM | POA: Insufficient documentation

## 2023-10-30 DIAGNOSIS — Z79621 Long term (current) use of calcineurin inhibitor: Secondary | ICD-10-CM | POA: Diagnosis not present

## 2023-10-30 DIAGNOSIS — D353 Benign neoplasm of craniopharyngeal duct: Secondary | ICD-10-CM

## 2023-10-30 DIAGNOSIS — D352 Benign neoplasm of pituitary gland: Secondary | ICD-10-CM | POA: Diagnosis not present

## 2023-10-30 DIAGNOSIS — Z7951 Long term (current) use of inhaled steroids: Secondary | ICD-10-CM | POA: Insufficient documentation

## 2023-10-30 DIAGNOSIS — Z7982 Long term (current) use of aspirin: Secondary | ICD-10-CM | POA: Diagnosis not present

## 2023-10-30 DIAGNOSIS — E039 Hypothyroidism, unspecified: Secondary | ICD-10-CM | POA: Diagnosis not present

## 2023-10-30 DIAGNOSIS — Z79899 Other long term (current) drug therapy: Secondary | ICD-10-CM | POA: Diagnosis not present

## 2023-10-30 DIAGNOSIS — E119 Type 2 diabetes mellitus without complications: Secondary | ICD-10-CM | POA: Insufficient documentation

## 2023-10-30 DIAGNOSIS — Z7985 Long-term (current) use of injectable non-insulin antidiabetic drugs: Secondary | ICD-10-CM | POA: Diagnosis not present

## 2023-10-30 DIAGNOSIS — Z7984 Long term (current) use of oral hypoglycemic drugs: Secondary | ICD-10-CM | POA: Diagnosis not present

## 2023-10-30 NOTE — Progress Notes (Signed)
[facility] [address]  [place], [place] [address] [phone]   Interval Evaluation  Date of Service: 10/30/23 Patient Name: [name] White Patient MRN: [id number] Patient DOB: 02/21/1941 Provider: Henreitta Leber, MD  Identifying Statement:  [name] White is a 82 y.o. female with  pituitary   adenoma    Oncologic History: Xx/xx/99Michaell Cowing total resection  06/04/20: Debulking transphenoidal resection of recurrent adenoma with Dr. Danielle Dess and Dr. Maurice Small 10/30/20: Completes 6 weeks IMRT to residual adenoma with Dr. Basilio Cairo   Interval History: [name] White presents today for follow up after recent MRI brain.  No new complaints or neurologic deficits today.  No headaches or seizures.  Maintains functional independence.  Still living with her daughter and small dog.  H+P (05/04/21) Patient presents to clinic today following her 3 months post-IMRT MRI scan.  She describes no recurrence in "tunnel vision" symptoms which had affected her for the 6 months prior to her fall and repeat surgery.  She does experience some double vision, with objects "on top of each other" worse when looking to the right.  This is also not a new complaint.  Otherwise, she complains of "blurriness" that impairs her ability to read at times, not in one particular eye or visual field.  Is followed by ophthalmology.  Denies non-visual neurologic symptoms.  Lives at home with her daughter, is functional and independent.  Medications: Current Outpatient Medications on File Prior to Visit  Medication Sig Dispense Refill   ACCU-CHEK AVIVA PLUS test strip TEST TWICE DAILY. 100 strip 0   albuterol (PROVENTIL HFA;VENTOLIN HFA) 108 (90 BASE) MCG/ACT inhaler Inhale 2 puffs into the lungs every 4 (four) hours as needed for wheezing or shortness of breath.     ALPRAZolam (XANAX) 0.25 MG tablet Take 0.25 mg by mouth daily as needed.     aspirin 81 MG tablet Take 81 mg by mouth daily.  (Patient not taking: Reported on 10/27/2023)     atorvastatin (LIPITOR) 40 MG tablet TAKE ONE (1) TABLET BY MOUTH EVERY DAY 90 tablet 3   baclofen (LIORESAL) 10 MG tablet Take 10 mg by mouth daily.     BREO ELLIPTA 200-25 MCG/INH AEPB Inhale 1 puff into the lungs daily.      carvedilol (COREG) 3.125 MG tablet TAKE ONE TABLET BY MOUTH TWICE A DAY 60 tablet 3   cycloSPORINE (RESTASIS) 0.05 % ophthalmic emulsion Place 1 drop into both eyes 2 (two) times daily.     dapagliflozin propanediol (FARXIGA) 5 MG TABS tablet Take 2.5 mg by mouth daily before breakfast. (Patient taking differently: Take 5 mg by mouth daily before breakfast.) 15 tablet 11   docusate sodium (COLACE) 100 MG capsule Take 1 capsule (100 mg total) by mouth 2 (two) times daily. 10 capsule 0   escitalopram (LEXAPRO) 20 MG tablet Take 20 mg by mouth daily.     estradiol (ESTRACE) 1 MG tablet TAKE ONE TABLET (1MG  TOTAL) BY MOUTH DAILY (Patient taking differently: Take 1 mg by mouth daily.) 90 tablet 1   fluticasone (FLONASE) 50 MCG/ACT nasal spray Place 2 sprays into the nose at bedtime.     furosemide (LASIX) 20 MG tablet May take Lasix 20 mg as NEEDED for leg swelling 45 tablet 3   HYDROcodone-acetaminophen (NORCO/VICODIN) 5-325 MG tablet Take 1 tablet by mouth every 6 (six) hours as needed for moderate pain.      levothyroxine (SYNTHROID) 75 MCG tablet Take 75 mcg  by mouth daily.     metFORMIN (GLUCOPHAGE-XR) 500 MG 24 hr tablet TAKE TWO (2) TABLETS BY MOUTH DAILY 180 tablet 3   methocarbamol (ROBAXIN) 500 MG tablet Take 1 tablet (500 mg total) by mouth 2 (two) times daily. 20 tablet 0   Multiple Vitamins-Minerals (MULTIVITAMINS THER. W/MINERALS) TABS Take 1 tablet by mouth every morning.     omeprazole (PRILOSEC) 20 MG capsule Take 20 mg by mouth every morning.     pregabalin (LYRICA) 150 MG capsule TAKE ONE CAPSULE (150 MG TOTAL) BY MOUTHTWO TIMES DAILY. 60 capsule 5   repaglinide (PRANDIN) 1 MG tablet TAKE ONE TABLET BY MOUTH WITH  BREAKFAST,4 TABLETS WITH LUNCH, AND 2 TABLETS WITHSUPPER 810 tablet 1   rizatriptan (MAXALT-MLT) 10 MG disintegrating tablet Take 10 mg by mouth as needed for migraine. May repeat in 2 hours if needed     traMADol (ULTRAM) 50 MG tablet Take 1 tablet (50 mg total) by mouth every 6 (six) hours as needed. 10 tablet 0   TRINTELLIX 10 MG TABS tablet Take 10 mg by mouth daily.     UNIFINE PENTIPS 31G X 6 MM MISC USE TO INJECT VICTOZA ONCE DAILY 100 each 2   venlafaxine (EFFEXOR-XR) 150 MG 24 hr capsule Take 150 mg by mouth every morning.     Vibegron (GEMTESA) 75 MG TABS Take 75 mg by mouth at bedtime.     VICTOZA 18 MG/3ML SOPN INJECT 0.3MLS (1.8MG  TOTAL) INTO THE SKIN DAILY 27 mL 2   No current facility-administered medications on file prior to visit.    Allergies:  Allergies  Allergen Reactions   Betadine [Povidone Iodine] Hives   Penicillins Rash   Past Medical History:  Past Medical History:  Diagnosis Date   Anxiety    Asthma    ASYMPTOMATIC POSTMENOPAUSAL STATUS 02/09/2009   Cataract    Constipation 03/19/2013   Depression    DIABETES MELLITUS, TYPE II 09/14/2007   HYPERCHOLESTEROLEMIA 02/09/2009   HYPERTENSION 02/09/2009   HYPOTHYROIDISM 09/14/2007   MIGRAINE HEADACHE 09/14/2007   Neuropathy    OSTEOPOROSIS 02/09/2009   PANCREATITIS 09/14/2007   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   PONV (postoperative nausea and vomiting)    Rectocele 03/19/2013   Shortness of breath    SUPERFICIAL PHLEBITIS 09/14/2007   Varicose veins    Past Surgical History:  Past Surgical History:  Procedure Laterality Date   ABDOMINAL HYSTERECTOMY     ANTERIOR AND POSTERIOR REPAIR N/A 04/02/2013   Procedure: ANTERIOR (CYSTOCELE) AND POSTERIOR REPAIR (RECTOCELE);  Surgeon: Tilda Burrow, MD;  Location: [facility];  Service: Gynecology;  Laterality: N/A;   APPENDECTOMY     BRAIN SURGERY     CATARACT EXTRACTION W/PHACO  12/05/2011   Procedure: CATARACT EXTRACTION PHACO AND INTRAOCULAR LENS PLACEMENT (IOC);  Surgeon:  Gemma Payor;  Location: [facility];  Service: Ophthalmology;  Laterality: Left;  CDE:9.65   CHOLECYSTECTOMY     CRANIOTOMY N/A 06/04/2020   Procedure: Endoscopic Transphenoidal Resection of Recurrent PituitaryTumor;  Surgeon: Barnett Abu, MD;  Location: [facility] OR;  Service: Neurosurgery;  Laterality: N/A;   CYSTOSCOPY     ENDOVENOUS ABLATION SAPHENOUS VEIN W/ LASER  11-03-2011   right greater saphenous vein   left leg done 10-2011   EYE SURGERY  98   right cataract extraction 98   LAPAROSCOPIC NISSEN FUNDOPLICATION     NM ESOPHAGEAL REFLUX  08-11-11   PITUITARY EXCISION  10/1997   POSTERIOR REPAIR     TRANSNASAL APPROACH N/A 06/04/2020  Procedure: TRANSNASAL APPROACH;  Surgeon: Osborn Coho, MD;  Location: [facility] OR;  Service: ENT;  Laterality: N/A;   TRANSPHENOIDAL / TRANSNASAL HYPOPHYSECTOMY / RESECTION PITUITARY TUMOR  08-11-11   Social History:  Social History   Socioeconomic History   Marital status: Married    Spouse name: Not on file   Number of children: Not on file   Years of education: Not on file   Highest education level: Not on file  Occupational History   Occupation: Retired    Associate Professor: RETIRED  Tobacco Use   Smoking status: Never   Smokeless tobacco: Never  Vaping Use   Vaping status: Never Used  Substance and Sexual Activity   Alcohol use: No    Alcohol/week: 0.0 standard drinks of alcohol   Drug use: No   Sexual activity: Not Currently    Birth control/protection: Surgical  Other Topics Concern   Not on file  Social History Narrative   Not on file   Social Determinants of Health   Financial Resource Strain: Not on file  Food Insecurity: Not on file  Transportation Needs: Not on file  Physical Activity: Not on file  Stress: Not on file  Social Connections: Not on file  Intimate Partner Violence: Not on file   Family History:  Family History  Problem Relation Age of Onset   Heart disease Mother    Asthma Mother    COPD Father    Heart disease  Father    Stroke Father    Asthma Daughter    Migraines Daughter    Neuropathy Son    Cancer Neg Hx    Anesthesia problems Neg Hx    Hypotension Neg Hx    Malignant hyperthermia Neg Hx    Pseudochol deficiency Neg Hx     Review of Systems: Constitutional: Doesn't report fevers, chills or abnormal weight loss Eyes: Doesn't report blurriness of vision Ears, nose, mouth, throat, and face: Doesn't report sore throat Respiratory: Doesn't report cough, dyspnea or wheezes Cardiovascular: Doesn't report palpitation, chest discomfort  Gastrointestinal:  Doesn't report nausea, constipation, diarrhea GU: Doesn't report incontinence Skin: Doesn't report skin rashes Neurological: Per HPI Musculoskeletal: Doesn't report joint pain Behavioral/Psych: Doesn't report anxiety  Physical Exam: There were no vitals filed for this visit.  KPS: 80. General: Alert, cooperative, pleasant, in no acute distress Head: Normal EENT: No conjunctival injection or scleral icterus.  Lungs: Resp effort normal Cardiac: Regular rate Abdomen: Non-distended abdomen Skin: No rashes cyanosis or petechiae. Extremities: No clubbing or edema  Neurologic Exam: Mental Status: Awake, alert, attentive to examiner. Oriented to self and environment. Language is fluent with intact comprehension.  Cranial Nerves: Visual acuity is grossly normal. Visual fields are full. Extra-ocular movements intact. No ptosis. Face is symmetric Motor: Tone and bulk are normal. Power is full in both arms and legs. Reflexes are symmetric, no pathologic reflexes present.  Sensory: Intact to light touch Gait: Normal.   Labs: I have reviewed the data as listed    Component Value Date/Time   NA 137 09/02/2022 2133   K 3.6 09/02/2022 2133   CL 103 09/02/2022 2133   CO2 26 09/02/2022 2133   GLUCOSE 60 (L) 09/02/2022 2133   BUN 13 09/02/2022 2133   CREATININE 0.65 09/02/2022 2133   CREATININE 0.72 10/03/2016 0706   CALCIUM 8.1 (L)  09/02/2022 2133   PROT 6.5 09/02/2022 2133   ALBUMIN 3.2 (L) 09/02/2022 2133   AST 33 09/02/2022 2133   ALT 25 09/02/2022 2133  ALKPHOS 30 (L) 09/02/2022 2133   BILITOT 0.7 09/02/2022 2133   GFRNONAA >60 09/02/2022 2133   GFRAA >60 06/05/2020 0606   Lab Results  Component Value Date   WBC 7.3 09/02/2022   NEUTROABS 5.1 09/02/2022   HGB 12.6 09/02/2022   HCT 38.7 09/02/2022   MCV 88.4 09/02/2022   PLT 200 09/02/2022    Imaging: [facility] Clinician Interpretation: I have personally reviewed the CNS images as listed.  My interpretation, in the context of the patient's clinical presentation, is stable disease  MR BRAIN W WO CONTRAST  Result Date: 10/26/2023 CLINICAL DATA:  Brain/CNS neoplasm, assess treatment response EXAM: MRI HEAD WITHOUT AND WITH CONTRAST TECHNIQUE: Multiplanar, multiecho pulse sequences of the brain and surrounding structures were obtained without and with intravenous contrast. CONTRAST:  6 mL of Vueway IV. COMPARISON:  MRI head October 28, 2022. FINDINGS: Brain: Similar size of a sellar/suprasellar mass which measures approximately 1.8 x 1.5 x 1.7 cm on series 138, image 67 and series 139, image 19 (previously 1.9 x 1.5 x 1.7 cm when remeasured similarly on the prior). The mass extends anterior to the anterior genu of the right ICA and extends to involve the right cavernous sinus, similar. Similar mass effect on the optic chiasm and right prechiasmatic optic nerve. No evidence of acute infarct, midline shift, hydrocephalus or extra-axial fluid collection. Remote cerebellar infarcts. Additional patchy T2/FLAIR hyperintensities in the white matter nonspecific but compatible with chronic microvascular ischemic disease. Vascular: Major arterial flow voids are maintained at the skull base. Skull and upper cervical spine: Normal marrow signal. Sinuses/Orbits: Clear sinuses.  No acute orbital findings. Other: No mastoid effusions. IMPRESSION: Similar size of the sellar/suprasellar  mass, detailed above. Electronically Signed   By: Feliberto Harts M.D.   On: 10/26/2023 12:57      Assessment/Plan PITUITARY ADENOMA  [name] White is clinically and radiographically stable today.  MRI demonstrates stable findings.  Will continue to follow with PCP for diabetes and pituitary survey as needed.  We appreciate the opportunity to participate in the care of [name] White.   We ask that [name] White return to clinic in 12 months following next brain MRI, or sooner as needed.  All questions were answered. The patient knows to call the clinic with any problems, questions or concerns. No barriers to learning were detected.  The total time spent in the encounter was 30 minutes and more than 50% was on counseling and review of test results   Henreitta Leber, MD Medical Director of Neuro-Oncology [facility] 10/30/23 10:24 AM

## 2023-11-04 ENCOUNTER — Telehealth: Payer: Self-pay | Admitting: Internal Medicine

## 2023-11-04 NOTE — Telephone Encounter (Signed)
Scheduled per 11/04 los, patient has been called and voicemail was left.

## 2023-11-14 ENCOUNTER — Other Ambulatory Visit: Payer: Self-pay | Admitting: Cardiology

## 2023-12-25 ENCOUNTER — Other Ambulatory Visit: Payer: Self-pay

## 2023-12-25 ENCOUNTER — Emergency Department (HOSPITAL_COMMUNITY): Payer: Medicare Other

## 2023-12-25 ENCOUNTER — Inpatient Hospital Stay (HOSPITAL_COMMUNITY)
Admission: EM | Admit: 2023-12-25 | Discharge: 2024-01-02 | DRG: 871 | Disposition: A | Discharge status: Skilled Nursing Facility | Payer: Medicare Other | Attending: Family Medicine | Admitting: Family Medicine

## 2023-12-25 ENCOUNTER — Encounter (HOSPITAL_COMMUNITY): Payer: Self-pay | Admitting: Emergency Medicine

## 2023-12-25 DIAGNOSIS — E872 Acidosis, unspecified: Secondary | ICD-10-CM | POA: Diagnosis present

## 2023-12-25 DIAGNOSIS — T796XXA Traumatic ischemia of muscle, initial encounter: Secondary | ICD-10-CM

## 2023-12-25 DIAGNOSIS — D649 Anemia, unspecified: Secondary | ICD-10-CM | POA: Diagnosis not present

## 2023-12-25 DIAGNOSIS — Z880 Allergy status to penicillin: Secondary | ICD-10-CM

## 2023-12-25 DIAGNOSIS — R410 Disorientation, unspecified: Secondary | ICD-10-CM | POA: Diagnosis not present

## 2023-12-25 DIAGNOSIS — Z043 Encounter for examination and observation following other accident: Secondary | ICD-10-CM | POA: Diagnosis not present

## 2023-12-25 DIAGNOSIS — E039 Hypothyroidism, unspecified: Secondary | ICD-10-CM | POA: Diagnosis present

## 2023-12-25 DIAGNOSIS — E1140 Type 2 diabetes mellitus with diabetic neuropathy, unspecified: Secondary | ICD-10-CM | POA: Diagnosis present

## 2023-12-25 DIAGNOSIS — R6521 Severe sepsis with septic shock: Secondary | ICD-10-CM | POA: Diagnosis not present

## 2023-12-25 DIAGNOSIS — A4151 Sepsis due to Escherichia coli [E. coli]: Principal | ICD-10-CM | POA: Diagnosis present

## 2023-12-25 DIAGNOSIS — N3001 Acute cystitis with hematuria: Secondary | ICD-10-CM | POA: Diagnosis not present

## 2023-12-25 DIAGNOSIS — M6282 Rhabdomyolysis: Secondary | ICD-10-CM | POA: Diagnosis not present

## 2023-12-25 DIAGNOSIS — Z7984 Long term (current) use of oral hypoglycemic drugs: Secondary | ICD-10-CM

## 2023-12-25 DIAGNOSIS — Z79899 Other long term (current) drug therapy: Secondary | ICD-10-CM

## 2023-12-25 DIAGNOSIS — N390 Urinary tract infection, site not specified: Secondary | ICD-10-CM | POA: Diagnosis present

## 2023-12-25 DIAGNOSIS — R4182 Altered mental status, unspecified: Principal | ICD-10-CM

## 2023-12-25 DIAGNOSIS — Z79890 Hormone replacement therapy (postmenopausal): Secondary | ICD-10-CM

## 2023-12-25 DIAGNOSIS — E11649 Type 2 diabetes mellitus with hypoglycemia without coma: Secondary | ICD-10-CM | POA: Diagnosis not present

## 2023-12-25 DIAGNOSIS — A419 Sepsis, unspecified organism: Secondary | ICD-10-CM | POA: Diagnosis not present

## 2023-12-25 DIAGNOSIS — Z888 Allergy status to other drugs, medicaments and biological substances status: Secondary | ICD-10-CM

## 2023-12-25 DIAGNOSIS — L89322 Pressure ulcer of left buttock, stage 2: Secondary | ICD-10-CM | POA: Diagnosis not present

## 2023-12-25 DIAGNOSIS — R9089 Other abnormal findings on diagnostic imaging of central nervous system: Secondary | ICD-10-CM | POA: Diagnosis not present

## 2023-12-25 DIAGNOSIS — L89312 Pressure ulcer of right buttock, stage 2: Secondary | ICD-10-CM | POA: Diagnosis present

## 2023-12-25 DIAGNOSIS — R6889 Other general symptoms and signs: Secondary | ICD-10-CM | POA: Diagnosis not present

## 2023-12-25 DIAGNOSIS — Z9049 Acquired absence of other specified parts of digestive tract: Secondary | ICD-10-CM

## 2023-12-25 DIAGNOSIS — R9082 White matter disease, unspecified: Secondary | ICD-10-CM | POA: Diagnosis not present

## 2023-12-25 DIAGNOSIS — N281 Cyst of kidney, acquired: Secondary | ICD-10-CM | POA: Diagnosis not present

## 2023-12-25 DIAGNOSIS — Z8249 Family history of ischemic heart disease and other diseases of the circulatory system: Secondary | ICD-10-CM

## 2023-12-25 DIAGNOSIS — E876 Hypokalemia: Secondary | ICD-10-CM | POA: Diagnosis present

## 2023-12-25 DIAGNOSIS — N3000 Acute cystitis without hematuria: Secondary | ICD-10-CM | POA: Diagnosis not present

## 2023-12-25 DIAGNOSIS — E7800 Pure hypercholesterolemia, unspecified: Secondary | ICD-10-CM | POA: Diagnosis not present

## 2023-12-25 DIAGNOSIS — F419 Anxiety disorder, unspecified: Secondary | ICD-10-CM | POA: Diagnosis present

## 2023-12-25 DIAGNOSIS — G939 Disorder of brain, unspecified: Secondary | ICD-10-CM | POA: Diagnosis not present

## 2023-12-25 DIAGNOSIS — Z7985 Long-term (current) use of injectable non-insulin antidiabetic drugs: Secondary | ICD-10-CM

## 2023-12-25 DIAGNOSIS — F29 Unspecified psychosis not due to a substance or known physiological condition: Secondary | ICD-10-CM | POA: Diagnosis not present

## 2023-12-25 DIAGNOSIS — I872 Venous insufficiency (chronic) (peripheral): Secondary | ICD-10-CM | POA: Diagnosis present

## 2023-12-25 DIAGNOSIS — Z7982 Long term (current) use of aspirin: Secondary | ICD-10-CM

## 2023-12-25 DIAGNOSIS — E119 Type 2 diabetes mellitus without complications: Secondary | ICD-10-CM

## 2023-12-25 DIAGNOSIS — Z90710 Acquired absence of both cervix and uterus: Secondary | ICD-10-CM

## 2023-12-25 DIAGNOSIS — Z0389 Encounter for observation for other suspected diseases and conditions ruled out: Secondary | ICD-10-CM | POA: Diagnosis not present

## 2023-12-25 DIAGNOSIS — G9341 Metabolic encephalopathy: Secondary | ICD-10-CM | POA: Diagnosis not present

## 2023-12-25 DIAGNOSIS — Z8672 Personal history of thrombophlebitis: Secondary | ICD-10-CM

## 2023-12-25 DIAGNOSIS — I700 Atherosclerosis of aorta: Secondary | ICD-10-CM | POA: Diagnosis not present

## 2023-12-25 DIAGNOSIS — R404 Transient alteration of awareness: Secondary | ICD-10-CM | POA: Diagnosis not present

## 2023-12-25 DIAGNOSIS — I10 Essential (primary) hypertension: Secondary | ICD-10-CM | POA: Diagnosis not present

## 2023-12-25 DIAGNOSIS — F32A Depression, unspecified: Secondary | ICD-10-CM | POA: Diagnosis present

## 2023-12-25 DIAGNOSIS — Z743 Need for continuous supervision: Secondary | ICD-10-CM | POA: Diagnosis not present

## 2023-12-25 DIAGNOSIS — E860 Dehydration: Secondary | ICD-10-CM | POA: Diagnosis not present

## 2023-12-25 DIAGNOSIS — L8990 Pressure ulcer of unspecified site, unspecified stage: Secondary | ICD-10-CM | POA: Insufficient documentation

## 2023-12-25 DIAGNOSIS — Z961 Presence of intraocular lens: Secondary | ICD-10-CM | POA: Diagnosis present

## 2023-12-25 DIAGNOSIS — W19XXXA Unspecified fall, initial encounter: Secondary | ICD-10-CM | POA: Diagnosis present

## 2023-12-25 DIAGNOSIS — Y92009 Unspecified place in unspecified non-institutional (private) residence as the place of occurrence of the external cause: Secondary | ICD-10-CM

## 2023-12-25 DIAGNOSIS — E1142 Type 2 diabetes mellitus with diabetic polyneuropathy: Secondary | ICD-10-CM

## 2023-12-25 LAB — CBC WITH DIFFERENTIAL/PLATELET
Abs Immature Granulocytes: 0.03 10*3/uL (ref 0.00–0.07)
Basophils Absolute: 0.1 10*3/uL (ref 0.0–0.1)
Basophils Relative: 1 %
Eosinophils Absolute: 0 10*3/uL (ref 0.0–0.5)
Eosinophils Relative: 0 %
HCT: 44.3 % (ref 36.0–46.0)
Hemoglobin: 14.9 g/dL (ref 12.0–15.0)
Immature Granulocytes: 0 %
Lymphocytes Relative: 11 %
Lymphs Abs: 1 10*3/uL (ref 0.7–4.0)
MCH: 30.1 pg (ref 26.0–34.0)
MCHC: 33.6 g/dL (ref 30.0–36.0)
MCV: 89.5 fL (ref 80.0–100.0)
Monocytes Absolute: 0.7 10*3/uL (ref 0.1–1.0)
Monocytes Relative: 8 %
Neutro Abs: 7.1 10*3/uL (ref 1.7–7.7)
Neutrophils Relative %: 80 %
Platelets: 164 10*3/uL (ref 150–400)
RBC: 4.95 MIL/uL (ref 3.87–5.11)
RDW: 14.4 % (ref 11.5–15.5)
WBC: 9 10*3/uL (ref 4.0–10.5)
nRBC: 0 % (ref 0.0–0.2)

## 2023-12-25 LAB — LACTIC ACID, PLASMA: Lactic Acid, Venous: 2.1 mmol/L (ref 0.5–1.9)

## 2023-12-25 LAB — ETHANOL: Alcohol, Ethyl (B): <10 mg/dL (ref <10)

## 2023-12-25 MED ORDER — LACTATED RINGERS IV BOLUS (SEPSIS)
2000.0000 mL | Freq: Once | INTRAVENOUS | Status: AC
  Administered 2023-12-25: 2000 mL via INTRAVENOUS

## 2023-12-25 MED ORDER — LACTATED RINGERS IV BOLUS (SEPSIS): 1000.0000 mL | Freq: Once | INTRAVENOUS | Status: DC

## 2023-12-25 NOTE — ED Provider Notes (Signed)
[place] EMERGENCY DEPARTMENT AT [facility] Provider Note   CSN: [id number] Arrival date & time: 12/25/23  2158     History  Chief Complaint  Patient presents with   Marletta Lor    [name] White is a 82 y.o. female.   Fall  Patient presents after a fall.  Medical history includes HTN, HLD, osteoporosis, migraines, anxiety, asthma, depression.  She lives independently.  Family was unable to get a hold of her today.  When they arrived at her home this evening, she was found on the floor.  She was confused.  When they looked at her medications, it appeared that she had not taken today's medications.  Last known well was last night.  EMS noted normal vital signs and normal CBG prior to arrival.  Patient is unable to provide any history.     Home Medications Prior to Admission medications   Medication Sig Start Date End Date Taking? Authorizing Provider  ACCU-CHEK AVIVA PLUS test strip TEST TWICE DAILY. 01/14/20   Corum, Minerva Fester, MD  albuterol (PROVENTIL HFA;VENTOLIN HFA) 108 (90 BASE) MCG/ACT inhaler Inhale 2 puffs into the lungs every 4 (four) hours as needed for wheezing or shortness of breath.    [provider]  ALPRAZolam Prudy Feeler) 0.25 MG tablet Take 0.25 mg by mouth daily as needed. 06/17/22   [provider]  aspirin 81 MG tablet Take 81 mg by mouth daily.    [provider]  atorvastatin (LIPITOR) 40 MG tablet TAKE ONE (1) TABLET BY MOUTH EVERY DAY 11/15/23   Antoine Poche, MD  baclofen (LIORESAL) 10 MG tablet Take 10 mg by mouth daily.    [provider]  BREO ELLIPTA 200-25 MCG/INH AEPB Inhale 1 puff into the lungs daily.  09/08/18   [provider]  carvedilol (COREG) 3.125 MG tablet TAKE ONE TABLET BY MOUTH TWICE A DAY 09/25/23   Antoine Poche, MD  cycloSPORINE (RESTASIS) 0.05 % ophthalmic emulsion Place 1 drop into both eyes 2 (two) times daily.    [provider]  dapagliflozin propanediol (FARXIGA) 5 MG  TABS tablet Take 2.5 mg by mouth daily before breakfast. Patient taking differently: Take 5 mg by mouth daily before breakfast. 12/26/19   Romero Belling, MD  docusate sodium (COLACE) 100 MG capsule Take 1 capsule (100 mg total) by mouth 2 (two) times daily. 06/06/20   Bedelia Person, MD  escitalopram (LEXAPRO) 20 MG tablet Take 20 mg by mouth daily. 08/11/22   [provider]  estradiol (ESTRACE) 1 MG tablet TAKE ONE TABLET (1MG  TOTAL) BY MOUTH DAILY Patient taking differently: Take 1 mg by mouth daily. 02/19/20   Tilda Burrow, MD  fluticasone (FLONASE) 50 MCG/ACT nasal spray Place 2 sprays into the nose at bedtime. 09/04/20 08/13/79  [provider]  furosemide (LASIX) 20 MG tablet May take Lasix 20 mg as NEEDED for leg swelling 03/13/15   Antoine Poche, MD  HYDROcodone-acetaminophen (NORCO/VICODIN) 5-325 MG tablet Take 1 tablet by mouth every 6 (six) hours as needed for moderate pain.     [provider]  levothyroxine (SYNTHROID) 75 MCG tablet Take 75 mcg by mouth daily. 08/01/22   [provider]  metFORMIN (GLUCOPHAGE-XR) 500 MG 24 hr tablet TAKE TWO (2) TABLETS BY MOUTH DAILY 11/30/21   Romero Belling, MD  methocarbamol (ROBAXIN) 500 MG tablet Take 1 tablet (500 mg total) by mouth 2 (two) times daily. 04/02/20   Arthor Captain, PA-C  Multiple Vitamins-Minerals (  MULTIVITAMINS THER. W/MINERALS) TABS Take 1 tablet by mouth every morning.    [provider]  omeprazole (PRILOSEC) 20 MG capsule Take 20 mg by mouth every morning.    [provider]  pregabalin (LYRICA) 150 MG capsule TAKE ONE CAPSULE (150 MG TOTAL) BY MOUTHTWO TIMES DAILY. 06/27/20   Tilda Burrow, MD  repaglinide (PRANDIN) 1 MG tablet TAKE ONE TABLET BY MOUTH WITH BREAKFAST,4 TABLETS WITH LUNCH, AND 2 TABLETS [facility] 03/10/22   Romero Belling, MD  rizatriptan (MAXALT-MLT) 10 MG disintegrating tablet Take 10 mg by mouth as needed for migraine. May repeat in 2 hours if needed     [provider]  traMADol (ULTRAM) 50 MG tablet Take 1 tablet (50 mg total) by mouth every 6 (six) hours as needed. 04/02/20   Harris, Abigail, PA-C  TRINTELLIX 10 MG TABS tablet Take 10 mg by mouth daily. 10/05/22   [provider]  UNIFINE PENTIPS 31G X 6 MM MISC USE TO INJECT VICTOZA ONCE DAILY 01/10/22   Romero Belling, MD  venlafaxine (EFFEXOR-XR) 150 MG 24 hr capsule Take 150 mg by mouth every morning.    [provider]  Vibegron (GEMTESA) 75 MG TABS Take 75 mg by mouth at bedtime.    [provider]  VICTOZA 18 MG/3ML SOPN INJECT 0.3MLS (1.8MG  TOTAL) INTO THE SKIN DAILY 03/28/22   Romero Belling, MD      Allergies    Betadine [povidone iodine] and Penicillins    Review of Systems   Review of Systems  Unable to perform ROS: Mental status change    Physical Exam Updated Vital Signs BP 113/64   Pulse 84   Temp 98.6 F (37 C) (Axillary)   Resp 17   Ht 5\' 6"  (1.676 m)   Wt 60 kg   SpO2 98%   BMI 21.35 kg/m  Physical Exam Vitals and nursing note reviewed.  Constitutional:      General: She is not in acute distress.    Appearance: Normal appearance. She is well-developed. She is not ill-appearing, toxic-appearing or diaphoretic.  HENT:     Head: Normocephalic and atraumatic.     Right Ear: External ear normal.     Left Ear: External ear normal.     Nose: Nose normal.     Mouth/Throat:     Mouth: Mucous membranes are moist.  Eyes:     Extraocular Movements: Extraocular movements intact.     Conjunctiva/sclera: Conjunctivae normal.  Cardiovascular:     Rate and Rhythm: Normal rate and regular rhythm.     Heart sounds: No murmur heard. Pulmonary:     Effort: Pulmonary effort is normal. No respiratory distress.     Breath sounds: Normal breath sounds. No wheezing or rales.  Chest:     Chest wall: No tenderness.  Abdominal:     General: There is no distension.     Palpations: Abdomen is soft.     Tenderness: There is abdominal  tenderness. There is no guarding or rebound.  Musculoskeletal:        General: No swelling or deformity.     Cervical back: Normal range of motion and neck supple.     Right lower leg: No edema.     Left lower leg: No edema.  Skin:    General: Skin is warm and dry.     Coloration: Skin is not jaundiced or pale.  Neurological:     General: No focal deficit present.     Mental  Status: She is alert. She is disoriented.     GCS: GCS eye subscore is 4. GCS verbal subscore is 2. GCS motor subscore is 6.     Cranial Nerves: No facial asymmetry.     Motor: No abnormal muscle tone.     ED Results / Procedures / Treatments   Labs (all labs ordered are listed, but only abnormal results are displayed) Labs Reviewed  CULTURE, BLOOD (ROUTINE X 2)  CULTURE, BLOOD (ROUTINE X 2)  LACTIC ACID, PLASMA  LACTIC ACID, PLASMA  COMPREHENSIVE METABOLIC PANEL  CBC WITH DIFFERENTIAL/PLATELET  BRAIN NATRIURETIC PEPTIDE  URINALYSIS, W/ REFLEX TO CULTURE (INFECTION SUSPECTED)  ETHANOL  CK  RAPID URINE DRUG SCREEN, HOSP PERFORMED  TSH  I-STAT CHEM 8, ED  TROPONIN I (HIGH SENSITIVITY)    EKG EKG Interpretation Date/Time:  Monday December 25 2023 22:36:00 EST Ventricular Rate:  78 PR Interval:  159 QRS Duration:  97 QT Interval:  416 QTC Calculation: 474 R Axis:   79  Text Interpretation: Sinus rhythm Atrial premature complex Confirmed by Gloris Manchester ([id number]) on 12/25/2023 11:26:56 PM  Radiology No results found.  Procedures Procedures    Medications Ordered in ED Medications  lactated ringers bolus 2,000 mL (2,000 mLs Intravenous New Bag/Given 12/25/23 2310)    ED Course/ Medical Decision Making/ A&P                                 Medical Decision Making Amount and/or Complexity of Data Reviewed Labs: ordered. Radiology: ordered.   This patient presents to the ED for concern of altered mental status, this involves an extensive number of treatment options, and is a complaint that  carries with it a high risk of complications and morbidity.  The differential diagnosis includes infection, metabolic derangements, head trauma, CVA, seizure, polypharmacy, medication withdrawal   Co morbidities that complicate the patient evaluation  HTN, HLD, osteoporosis, migraines, anxiety, asthma, depression   Additional history obtained:  Additional history obtained from EMS External records from outside source obtained and reviewed including EMR   Lab Tests:  I Ordered, and personally interpreted labs.  The pertinent results include: (Results pending at time of signout)   Imaging Studies ordered:  I ordered imaging studies including chest x-ray, CT of head, cervical spine, chest, abdomen, pelvis I independently visualized and interpreted imaging which showed (pending at time of signout) I agree with the radiologist interpretation   Cardiac Monitoring: / EKG:  The patient was maintained on a cardiac monitor.  I personally viewed and interpreted the cardiac monitored which showed an underlying rhythm of: Sinus rhythm   Problem List / ED Course / Critical interventions / Medication management  Patient presents for altered mental status.  She was found down in her home.  Family estimates that she may have been down on the floor for long as 24 hours.  On arrival, patient is able to follow commands.  She does to have good strength in all extremities.  There is no external evidence of recent trauma.  Skin is intact.  Vital signs are notable for hypotension.  I suspect dehydration secondary to prolonged downtime at home.  IV fluids were ordered.  Workup was initiated.  Patient's blood pressure improved with IV fluids.  Results of lab work and imaging are pending at time of signout.  Care of patient was signed out to oncoming ED provider. I ordered medication including IV fluids for hydration  Reevaluation of the patient after these medicines showed that the patient improved I have  reviewed the patients home medicines and have made adjustments as needed   Social Determinants of Health:  Lives independently        Final Clinical Impression(s) / ED Diagnoses Final diagnoses:  Altered mental status, unspecified altered mental status type    Rx / DC Orders ED Discharge Orders     None         Gloris Manchester, MD 12/25/23 2329

## 2023-12-25 NOTE — ED Triage Notes (Signed)
Pt was found in floor this evening and has been in the bedroom floor since this am. Pt is usually alert and oriented, but is altered at this time.

## 2023-12-26 ENCOUNTER — Inpatient Hospital Stay (HOSPITAL_COMMUNITY): Payer: Medicare Other

## 2023-12-26 DIAGNOSIS — E872 Acidosis, unspecified: Secondary | ICD-10-CM | POA: Diagnosis present

## 2023-12-26 DIAGNOSIS — K219 Gastro-esophageal reflux disease without esophagitis: Secondary | ICD-10-CM | POA: Diagnosis not present

## 2023-12-26 DIAGNOSIS — L89312 Pressure ulcer of right buttock, stage 2: Secondary | ICD-10-CM | POA: Diagnosis present

## 2023-12-26 DIAGNOSIS — Z79890 Hormone replacement therapy (postmenopausal): Secondary | ICD-10-CM | POA: Diagnosis not present

## 2023-12-26 DIAGNOSIS — A419 Sepsis, unspecified organism: Secondary | ICD-10-CM | POA: Diagnosis present

## 2023-12-26 DIAGNOSIS — R262 Difficulty in walking, not elsewhere classified: Secondary | ICD-10-CM | POA: Diagnosis not present

## 2023-12-26 DIAGNOSIS — R6521 Severe sepsis with septic shock: Secondary | ICD-10-CM | POA: Diagnosis present

## 2023-12-26 DIAGNOSIS — F419 Anxiety disorder, unspecified: Secondary | ICD-10-CM | POA: Diagnosis present

## 2023-12-26 DIAGNOSIS — D649 Anemia, unspecified: Secondary | ICD-10-CM | POA: Diagnosis present

## 2023-12-26 DIAGNOSIS — N3000 Acute cystitis without hematuria: Secondary | ICD-10-CM

## 2023-12-26 DIAGNOSIS — R5381 Other malaise: Secondary | ICD-10-CM | POA: Diagnosis not present

## 2023-12-26 DIAGNOSIS — E039 Hypothyroidism, unspecified: Secondary | ICD-10-CM | POA: Diagnosis not present

## 2023-12-26 DIAGNOSIS — Z8249 Family history of ischemic heart disease and other diseases of the circulatory system: Secondary | ICD-10-CM | POA: Diagnosis not present

## 2023-12-26 DIAGNOSIS — E893 Postprocedural hypopituitarism: Secondary | ICD-10-CM | POA: Diagnosis not present

## 2023-12-26 DIAGNOSIS — R42 Dizziness and giddiness: Secondary | ICD-10-CM | POA: Diagnosis not present

## 2023-12-26 DIAGNOSIS — G43909 Migraine, unspecified, not intractable, without status migrainosus: Secondary | ICD-10-CM | POA: Diagnosis not present

## 2023-12-26 DIAGNOSIS — N390 Urinary tract infection, site not specified: Secondary | ICD-10-CM | POA: Diagnosis not present

## 2023-12-26 DIAGNOSIS — L89322 Pressure ulcer of left buttock, stage 2: Secondary | ICD-10-CM | POA: Diagnosis present

## 2023-12-26 DIAGNOSIS — M25512 Pain in left shoulder: Secondary | ICD-10-CM | POA: Diagnosis not present

## 2023-12-26 DIAGNOSIS — W19XXXA Unspecified fall, initial encounter: Secondary | ICD-10-CM | POA: Diagnosis present

## 2023-12-26 DIAGNOSIS — E11649 Type 2 diabetes mellitus with hypoglycemia without coma: Secondary | ICD-10-CM | POA: Diagnosis not present

## 2023-12-26 DIAGNOSIS — F29 Unspecified psychosis not due to a substance or known physiological condition: Secondary | ICD-10-CM | POA: Diagnosis not present

## 2023-12-26 DIAGNOSIS — M25511 Pain in right shoulder: Secondary | ICD-10-CM | POA: Diagnosis not present

## 2023-12-26 DIAGNOSIS — G9341 Metabolic encephalopathy: Secondary | ICD-10-CM | POA: Diagnosis not present

## 2023-12-26 DIAGNOSIS — M810 Age-related osteoporosis without current pathological fracture: Secondary | ICD-10-CM | POA: Diagnosis not present

## 2023-12-26 DIAGNOSIS — Z79899 Other long term (current) drug therapy: Secondary | ICD-10-CM | POA: Diagnosis not present

## 2023-12-26 DIAGNOSIS — J45909 Unspecified asthma, uncomplicated: Secondary | ICD-10-CM | POA: Diagnosis not present

## 2023-12-26 DIAGNOSIS — I10 Essential (primary) hypertension: Secondary | ICD-10-CM | POA: Diagnosis not present

## 2023-12-26 DIAGNOSIS — Y92009 Unspecified place in unspecified non-institutional (private) residence as the place of occurrence of the external cause: Secondary | ICD-10-CM | POA: Diagnosis not present

## 2023-12-26 DIAGNOSIS — M19012 Primary osteoarthritis, left shoulder: Secondary | ICD-10-CM | POA: Diagnosis not present

## 2023-12-26 DIAGNOSIS — F32A Depression, unspecified: Secondary | ICD-10-CM | POA: Diagnosis present

## 2023-12-26 DIAGNOSIS — M6281 Muscle weakness (generalized): Secondary | ICD-10-CM | POA: Diagnosis not present

## 2023-12-26 DIAGNOSIS — Z7984 Long term (current) use of oral hypoglycemic drugs: Secondary | ICD-10-CM | POA: Diagnosis not present

## 2023-12-26 DIAGNOSIS — I471 Supraventricular tachycardia, unspecified: Secondary | ICD-10-CM | POA: Diagnosis not present

## 2023-12-26 DIAGNOSIS — E1140 Type 2 diabetes mellitus with diabetic neuropathy, unspecified: Secondary | ICD-10-CM | POA: Diagnosis present

## 2023-12-26 DIAGNOSIS — M6282 Rhabdomyolysis: Secondary | ICD-10-CM | POA: Diagnosis not present

## 2023-12-26 DIAGNOSIS — E119 Type 2 diabetes mellitus without complications: Secondary | ICD-10-CM | POA: Diagnosis not present

## 2023-12-26 DIAGNOSIS — E441 Mild protein-calorie malnutrition: Secondary | ICD-10-CM | POA: Diagnosis not present

## 2023-12-26 DIAGNOSIS — E876 Hypokalemia: Secondary | ICD-10-CM | POA: Diagnosis present

## 2023-12-26 DIAGNOSIS — I872 Venous insufficiency (chronic) (peripheral): Secondary | ICD-10-CM | POA: Diagnosis present

## 2023-12-26 DIAGNOSIS — A4151 Sepsis due to Escherichia coli [E. coli]: Secondary | ICD-10-CM | POA: Diagnosis present

## 2023-12-26 DIAGNOSIS — D352 Benign neoplasm of pituitary gland: Secondary | ICD-10-CM | POA: Diagnosis not present

## 2023-12-26 DIAGNOSIS — R410 Disorientation, unspecified: Secondary | ICD-10-CM | POA: Diagnosis present

## 2023-12-26 DIAGNOSIS — E7800 Pure hypercholesterolemia, unspecified: Secondary | ICD-10-CM | POA: Diagnosis present

## 2023-12-26 DIAGNOSIS — N3281 Overactive bladder: Secondary | ICD-10-CM | POA: Diagnosis not present

## 2023-12-26 DIAGNOSIS — Z7982 Long term (current) use of aspirin: Secondary | ICD-10-CM | POA: Diagnosis not present

## 2023-12-26 LAB — RAPID URINE DRUG SCREEN, HOSP PERFORMED
Amphetamines: NOT DETECTED
Barbiturates: NOT DETECTED
Benzodiazepines: NOT DETECTED
Cocaine: NOT DETECTED
Opiates: NOT DETECTED
Tetrahydrocannabinol: NOT DETECTED

## 2023-12-26 LAB — COMPREHENSIVE METABOLIC PANEL
ALT: 26 U/L (ref 0–44)
AST: 51 U/L — ABNORMAL HIGH (ref 15–41)
Albumin: 3.2 g/dL — ABNORMAL LOW (ref 3.5–5.0)
Alkaline Phosphatase: 32 U/L — ABNORMAL LOW (ref 38–126)
Anion gap: 12 (ref 5–15)
BUN: 12 mg/dL (ref 8–23)
CO2: 19 mmol/L — ABNORMAL LOW (ref 22–32)
Calcium: 8.5 mg/dL — ABNORMAL LOW (ref 8.9–10.3)
Chloride: 103 mmol/L (ref 98–111)
Creatinine, Ser: 0.8 mg/dL (ref 0.44–1.00)
GFR, Estimated: >60 mL/min (ref >60)
Glucose, Bld: 91 mg/dL (ref 70–99)
Potassium: 3.9 mmol/L (ref 3.5–5.1)
Sodium: 134 mmol/L — ABNORMAL LOW (ref 135–145)
Total Bilirubin: 1.5 mg/dL — ABNORMAL HIGH (ref 0.0–1.2)
Total Protein: 6.6 g/dL (ref 6.5–8.1)

## 2023-12-26 LAB — HEMOGLOBIN A1C
Hgb A1c MFr Bld: 6 % — ABNORMAL HIGH (ref 4.8–5.6)
Mean Plasma Glucose: 126 mg/dL

## 2023-12-26 LAB — CK: Total CK: 2153 U/L — ABNORMAL HIGH (ref 38–234)

## 2023-12-26 LAB — GLUCOSE, CAPILLARY
Glucose-Capillary: 108 mg/dL — ABNORMAL HIGH (ref 70–99)
Glucose-Capillary: 84 mg/dL (ref 70–99)
Glucose-Capillary: 77 mg/dL (ref 70–99)

## 2023-12-26 LAB — TSH: TSH: 0.758 u[IU]/mL (ref 0.350–4.500)

## 2023-12-26 LAB — URINALYSIS, W/ REFLEX TO CULTURE (INFECTION SUSPECTED)
Bilirubin Urine: NEGATIVE
Glucose, UA: >=500 mg/dL — AB
Ketones, ur: 20 mg/dL — AB
Nitrite: POSITIVE — AB
Protein, ur: 100 mg/dL — AB
RBC / HPF: >50 RBC/hpf (ref 0–5)
Specific Gravity, Urine: 1.022 (ref 1.005–1.030)
WBC, UA: >50 WBC/hpf (ref 0–5)
pH: 5 (ref 5.0–8.0)

## 2023-12-26 LAB — TROPONIN I (HIGH SENSITIVITY)
Troponin I (High Sensitivity): 7 ng/L (ref <18)
Troponin I (High Sensitivity): 7 ng/L (ref <18)

## 2023-12-26 LAB — BRAIN NATRIURETIC PEPTIDE: B Natriuretic Peptide: 475 pg/mL — ABNORMAL HIGH (ref 0.0–100.0)

## 2023-12-26 LAB — MRSA NEXT GEN BY PCR, NASAL: MRSA by PCR Next Gen: NOT DETECTED

## 2023-12-26 LAB — LACTIC ACID, PLASMA: Lactic Acid, Venous: 1.6 mmol/L (ref 0.5–1.9)

## 2023-12-26 MED ORDER — LEVOTHYROXINE SODIUM 50 MCG PO TABS
50.0000 ug | ORAL_TABLET | Freq: Every day | ORAL | Status: DC
Start: 2023-12-26 — End: 2023-12-26

## 2023-12-26 MED ORDER — VENLAFAXINE HCL ER 75 MG PO CP24
150.0000 mg | ORAL_CAPSULE | Freq: Every day | ORAL | Status: DC
Start: 2023-12-26 — End: 2023-12-26

## 2023-12-26 MED ORDER — ASPIRIN 81 MG PO TBEC
81.0000 mg | DELAYED_RELEASE_TABLET | Freq: Every day | ORAL | Status: DC
  Administered 2023-12-28 – 2024-01-02 (×6): 81 mg via ORAL
  Filled 2023-12-26 (×7): qty 1

## 2023-12-26 MED ORDER — SODIUM CHLORIDE 0.9 % IV SOLN
250.0000 mL | INTRAVENOUS | Status: DC
Start: 2023-12-26 — End: 2023-12-27
  Administered 2023-12-26: 250 mL via INTRAVENOUS

## 2023-12-26 MED ORDER — ATORVASTATIN CALCIUM 40 MG PO TABS: 40.0000 mg | ORAL_TABLET | Freq: Every day | ORAL | Status: DC

## 2023-12-26 MED ORDER — DOCUSATE SODIUM 100 MG PO CAPS
100.0000 mg | ORAL_CAPSULE | Freq: Two times a day (BID) | ORAL | Status: DC
  Administered 2023-12-27 – 2024-01-02 (×12): 100 mg via ORAL
  Filled 2023-12-26 (×12): qty 1

## 2023-12-26 MED ORDER — HALOPERIDOL LACTATE 5 MG/ML IJ SOLN
5.0000 mg | Freq: Once | INTRAMUSCULAR | Status: AC
  Administered 2023-12-26: 5 mg via INTRAMUSCULAR
  Filled 2023-12-26: qty 1

## 2023-12-26 MED ORDER — LORAZEPAM 2 MG/ML IJ SOLN
0.5000 mg | Freq: Once | INTRAMUSCULAR | Status: AC
  Administered 2023-12-26: 0.5 mg via INTRAVENOUS
  Filled 2023-12-26: qty 1

## 2023-12-26 MED ORDER — POLYETHYLENE GLYCOL 3350 17 G PO PACK: 17.0000 g | PACK | Freq: Every day | ORAL | Status: DC | PRN

## 2023-12-26 MED ORDER — ALBUTEROL SULFATE (2.5 MG/3ML) 0.083% IN NEBU
2.5000 mg | INHALATION_SOLUTION | RESPIRATORY_TRACT | Status: DC | PRN
Start: 2023-12-26 — End: 2023-12-26

## 2023-12-26 MED ORDER — SODIUM CHLORIDE 0.9 % IV SOLN
1.0000 g | INTRAVENOUS | Status: DC
  Administered 2023-12-27 – 2023-12-29 (×3): 1 g via INTRAVENOUS
  Filled 2023-12-26 (×3): qty 10

## 2023-12-26 MED ORDER — LEVOTHYROXINE SODIUM 25 MCG PO TABS: 50.0000 ug | ORAL_TABLET | Freq: Every day | ORAL | Status: DC

## 2023-12-26 MED ORDER — ONDANSETRON HCL 4 MG/2ML IJ SOLN: 4.0000 mg | Freq: Four times a day (QID) | INTRAMUSCULAR | Status: DC | PRN

## 2023-12-26 MED ORDER — INSULIN ASPART 100 UNIT/ML IJ SOLN: 0.0000 [IU] | Freq: Three times a day (TID) | INTRAMUSCULAR | Status: DC

## 2023-12-26 MED ORDER — MELATONIN 3 MG PO TABS: 3.0000 mg | ORAL_TABLET | Freq: Every evening | ORAL | Status: DC | PRN

## 2023-12-26 MED ORDER — ALBUTEROL SULFATE (2.5 MG/3ML) 0.083% IN NEBU
2.5000 mg | INHALATION_SOLUTION | RESPIRATORY_TRACT | Status: DC | PRN
  Administered 2023-12-27: 2.5 mg via RESPIRATORY_TRACT
  Filled 2023-12-26: qty 3

## 2023-12-26 MED ORDER — SODIUM CHLORIDE 0.9 % IV SOLN
1.0000 g | Freq: Once | INTRAVENOUS | Status: AC
  Administered 2023-12-26: 1 g via INTRAVENOUS
  Filled 2023-12-26: qty 10

## 2023-12-26 MED ORDER — SUMATRIPTAN SUCCINATE 100 MG PO TABS: 100.0000 mg | ORAL_TABLET | ORAL | Status: DC | PRN

## 2023-12-26 MED ORDER — PANTOPRAZOLE SODIUM 40 MG PO TBEC
40.0000 mg | DELAYED_RELEASE_TABLET | Freq: Every day | ORAL | Status: DC
  Administered 2023-12-28 – 2024-01-02 (×6): 40 mg via ORAL
  Filled 2023-12-26 (×6): qty 1

## 2023-12-26 MED ORDER — NOREPINEPHRINE 4 MG/250ML-% IV SOLN
2.0000 ug/min | INTRAVENOUS | Status: DC
  Administered 2023-12-26: 2 ug/min via INTRAVENOUS
  Filled 2023-12-26: qty 250

## 2023-12-26 MED ORDER — HALOPERIDOL LACTATE 5 MG/ML IJ SOLN
2.0000 mg | Freq: Once | INTRAMUSCULAR | Status: AC
  Administered 2023-12-26: 2 mg via INTRAMUSCULAR
  Filled 2023-12-26: qty 1

## 2023-12-26 MED ORDER — ACETAMINOPHEN 650 MG RE SUPP: 650.0000 mg | Freq: Four times a day (QID) | RECTAL | Status: DC | PRN

## 2023-12-26 MED ORDER — LIDOCAINE 5 % EX PTCH
1.0000 | MEDICATED_PATCH | CUTANEOUS | Status: DC
Start: 2023-12-26 — End: 2024-01-02
  Administered 2023-12-26 – 2024-01-02 (×8): 1 via TRANSDERMAL
  Filled 2023-12-26 (×11): qty 1

## 2023-12-26 MED ORDER — ENOXAPARIN SODIUM 40 MG/0.4ML IJ SOSY
40.0000 mg | PREFILLED_SYRINGE | INTRAMUSCULAR | Status: DC
  Administered 2023-12-26 – 2024-01-02 (×8): 40 mg via SUBCUTANEOUS
  Filled 2023-12-26 (×8): qty 0.4

## 2023-12-26 MED ORDER — ATORVASTATIN CALCIUM 40 MG PO TABS
40.0000 mg | ORAL_TABLET | Freq: Every day | ORAL | Status: DC
  Filled 2023-12-26: qty 1

## 2023-12-26 MED ORDER — ONDANSETRON HCL 4 MG PO TABS: 4.0000 mg | ORAL_TABLET | Freq: Four times a day (QID) | ORAL | Status: DC | PRN

## 2023-12-26 MED ORDER — LEVOTHYROXINE SODIUM 75 MCG PO TABS
75.0000 ug | ORAL_TABLET | Freq: Every day | ORAL | Status: DC
  Administered 2023-12-28: 75 ug via ORAL
  Filled 2023-12-26: qty 1

## 2023-12-26 MED ORDER — KETOROLAC TROMETHAMINE 15 MG/ML IJ SOLN
7.5000 mg | Freq: Once | INTRAMUSCULAR | Status: AC
  Administered 2023-12-26: 7.5 mg via INTRAVENOUS
  Filled 2023-12-26: qty 1

## 2023-12-26 MED ORDER — CYCLOSPORINE 0.05 % OP EMUL
1.0000 [drp] | Freq: Two times a day (BID) | OPHTHALMIC | Status: DC
  Administered 2023-12-26 – 2024-01-02 (×13): 1 [drp] via OPHTHALMIC
  Filled 2023-12-26 (×13): qty 30

## 2023-12-26 MED ORDER — INSULIN ASPART 100 UNIT/ML IJ SOLN: 0.0000 [IU] | Freq: Every day | INTRAMUSCULAR | Status: DC

## 2023-12-26 MED ORDER — LACTATED RINGERS IV BOLUS
500.0000 mL | Freq: Once | INTRAVENOUS | Status: AC
Start: 2023-12-26 — End: 2023-12-26
  Administered 2023-12-26: 500 mL via INTRAVENOUS

## 2023-12-26 MED ORDER — LACTATED RINGERS IV SOLN: INTRAVENOUS | Status: DC

## 2023-12-26 MED ORDER — CHLORHEXIDINE GLUCONATE CLOTH 2 % EX PADS
6.0000 | MEDICATED_PAD | Freq: Every day | CUTANEOUS | Status: DC
Start: 2023-12-26 — End: 2023-12-30
  Administered 2023-12-26 – 2023-12-29 (×4): 6 via TOPICAL

## 2023-12-26 MED ORDER — ACETAMINOPHEN 325 MG PO TABS
650.0000 mg | ORAL_TABLET | Freq: Four times a day (QID) | ORAL | Status: DC | PRN
  Administered 2023-12-27: 650 mg via ORAL
  Filled 2023-12-26: qty 2

## 2023-12-26 MED ORDER — VENLAFAXINE HCL ER 75 MG PO CP24
150.0000 mg | ORAL_CAPSULE | Freq: Every day | ORAL | Status: DC
  Administered 2023-12-28 – 2024-01-02 (×6): 150 mg via ORAL
  Filled 2023-12-26 (×6): qty 2

## 2023-12-26 NOTE — ED Notes (Signed)
 Hospitalist notified of pt's continued BP of 80s systolic. MD states to do another bolus of LR and will reassess around 1000.

## 2023-12-26 NOTE — ED Notes (Signed)
Admitting provider paged.

## 2023-12-26 NOTE — ED Notes (Signed)
Son stepped out to care but requests to be called whenever the hospitalist comes down so that he can be present to speak with them

## 2023-12-26 NOTE — Progress Notes (Signed)
 Problem  Sepsis Due to Urinary Tract Infection (Hcc)  Acute Metabolic Encephalopathy  Severe Sepsis with Septic shock (HCC)  Uti (Urinary Tract Infection)  Essential Hypertension  Hypothyroidism  Diabetes (Hcc)    Patient admitted earlier with severe sepsis due to presumed UTI --- Currently on IV fluids, IV antibiotics pending urine and blood cultures   -Patient reevaluated by me several times throughout the day ---Continues to have altered mentation and persistent hypotension spite aggressive IV fluid boluses - Start IV Levophed  for pressure support --Patient remains hemodynamically unstable -Plan of care discussed with patient's son at bedside  CRITICAL CARE Performed by: Rendall Carwin   Total critical care time: 45 minutes  Critical care time was exclusive of separately billable procedures and treating other patients.  Critical care was necessary to treat or prevent imminent or life-threatening deterioration.  Critical care was time spent personally by me on the following activities: development of treatment plan with patient and/or surrogate as well as nursing, discussions with consultants, evaluation of patient's response to treatment, examination of patient, obtaining history from patient or surrogate, ordering and performing treatments and interventions, ordering and review of laboratory studies, ordering and review of radiographic studies, pulse oximetry and re-evaluation of patient's condition.  Rendall Carwin, MD

## 2023-12-26 NOTE — ED Notes (Signed)
 Family member provided update and emotional support, as he voiced concerns about her trying to climb out of bed and seeming uncomfortable. Explained to son that we have reached out to the hospitalist in regards to patient behavior and BP, and we are waiting to hear back. Also explained to son that the patient was moved around to this room so that staff could see her from the desk, and that a bed alarm has been placed underneath her as well. Son states that he would like to step outside for a breath of fresh air but did not feel comfortable leaving her alone. This nurse offered to have a staff member sit with patient at bedside while he stepped out if that made him feel more comfortable, to which he agrees to

## 2023-12-26 NOTE — Plan of Care (Signed)

## 2023-12-26 NOTE — H&P (Addendum)
 History and Physical    Patient: [name] White FMW:[id number] DOB: 05-18-1941 DOA: 12/25/2023 DOS: the patient was seen and examined on 12/26/2023 PCP: [name], MD  Patient coming from: Home  Chief Complaint:  Chief Complaint  Patient presents with   Fall   HPI: [name] White is a 82 y.o. female with medical history significant of hypertension, hyperlipidemia, osteoporosis, migraines, anxiety, asthma, depression, hypothyroidism, pituitary adenoma s/p resection, type 2 diabetes mellitus, neuropathy, chronic venous insufficiency. She presented to [facility] yesterday night after being found on the floor by family.  Patient lives independently, she fell at home and was unable to get up from the floor. Family was unable to get hold of her, so a family member checked on her in the evening and was found on the floor confused. Family believes she fell sometime between Saturday evening and Sunday evening when she was found. This estimation is based upon her medicine container that she refills every Sunday morning being empty indicating to family that she was able to take her Saturday evening medications but was not able to refill the medicine container for the week Sunday morning. LKW was Saturday evening. History is obtained entirely from son at bedside and daughter Sydnei via phone secondary to patient confusion.  ED Course: On arrival to [facility] ED patient was noted to be confused, afebrile temp 37C, hypotensive BP 78/68, HR 88, RR 20, SpO2 99% on room air. CT head, C-spine, and chest abdomen and pelvis obtained in ED and all without acute or traumatic findings. Labs notable for lactic acid of 2.1,CK 2153, BNP 475,  troponin negative x 2, ethanol less than 10, UDS negative, and urinalysis with large leukocytes nitrite positive > 50 WBC many bacteria. She received 2.5 L of LR and 1 g of ceftriaxone . [facility] contacted for admission.   Review of Systems: unable to review all systems due to the  inability of the patient to answer questions. Past Medical History:  Diagnosis Date   Anxiety    Asthma    ASYMPTOMATIC POSTMENOPAUSAL STATUS 02/09/2009   Cataract    Constipation 03/19/2013   Depression    DIABETES MELLITUS, TYPE II 09/14/2007   HYPERCHOLESTEROLEMIA 02/09/2009   HYPERTENSION 02/09/2009   HYPOTHYROIDISM 09/14/2007   MIGRAINE HEADACHE 09/14/2007   Neuropathy    OSTEOPOROSIS 02/09/2009   PANCREATITIS 09/14/2007   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   PONV (postoperative nausea and vomiting)    Rectocele 03/19/2013   Shortness of breath    SUPERFICIAL PHLEBITIS 09/14/2007   Varicose veins    Past Surgical History:  Procedure Laterality Date   ABDOMINAL HYSTERECTOMY     ANTERIOR AND POSTERIOR REPAIR N/A 04/02/2013   Procedure: ANTERIOR (CYSTOCELE) AND POSTERIOR REPAIR (RECTOCELE);  Surgeon: Norleen LULLA Server, MD;  Location: [facility];  Service: Gynecology;  Laterality: N/A;   APPENDECTOMY     BRAIN SURGERY     CATARACT EXTRACTION W/PHACO  12/05/2011   Procedure: CATARACT EXTRACTION PHACO AND INTRAOCULAR LENS PLACEMENT (IOC);  Surgeon: Cherene Mania;  Location: [facility];  Service: Ophthalmology;  Laterality: Left;  CDE:9.65   CHOLECYSTECTOMY     CRANIOTOMY N/A 06/04/2020   Procedure: Endoscopic Transphenoidal Resection of Recurrent PituitaryTumor;  Surgeon: Colon Shove, MD;  Location: [facility] OR;  Service: Neurosurgery;  Laterality: N/A;   CYSTOSCOPY     ENDOVENOUS ABLATION SAPHENOUS VEIN W/ LASER  11-03-2011   right greater saphenous vein   left leg done 10-2011   EYE SURGERY  98  right cataract extraction 98   LAPAROSCOPIC NISSEN FUNDOPLICATION     NM ESOPHAGEAL REFLUX  08-11-11   PITUITARY EXCISION  10/1997   POSTERIOR REPAIR     TRANSNASAL APPROACH N/A 06/04/2020   Procedure: TRANSNASAL APPROACH;  Surgeon: Mable Lenis, MD;  Location: [facility] OR;  Service: ENT;  Laterality: N/A;   TRANSPHENOIDAL / TRANSNASAL HYPOPHYSECTOMY / RESECTION PITUITARY TUMOR  08-11-11   Social History:  reports  that she has never smoked. She has never used smokeless tobacco. She reports that she does not drink alcohol and does not use drugs.  Allergies  Allergen Reactions   Betadine [Povidone Iodine] Hives   Penicillins Rash    Family History  Problem Relation Age of Onset   Heart disease Mother    Asthma Mother    COPD Father    Heart disease Father    Stroke Father    Asthma Daughter    Migraines Daughter    Neuropathy Son    Cancer Neg Hx    Anesthesia problems Neg Hx    Hypotension Neg Hx    Malignant hyperthermia Neg Hx    Pseudochol deficiency Neg Hx     Prior to Admission medications   Medication Sig Start Date End Date Taking? Authorizing Provider  ACCU-CHEK AVIVA PLUS test strip TEST TWICE DAILY. 01/14/20   Corum, Olam CROME, MD  albuterol  (PROVENTIL  HFA;VENTOLIN  HFA) 108 (90 BASE) MCG/ACT inhaler Inhale 2 puffs into the lungs every 4 (four) hours as needed for wheezing or shortness of breath.    [provider]  ALPRAZolam  (XANAX ) 0.25 MG tablet Take 0.25 mg by mouth daily as needed. 06/17/22   [provider]  aspirin  81 MG tablet Take 81 mg by mouth daily.    [provider]  atorvastatin  (LIPITOR) 40 MG tablet TAKE ONE (1) TABLET BY MOUTH EVERY DAY 11/15/23   Alvan Dorn FALCON, MD  baclofen  (LIORESAL ) 10 MG tablet Take 10 mg by mouth daily.    [provider]  BREO ELLIPTA  200-25 MCG/INH AEPB Inhale 1 puff into the lungs daily.  09/08/18   [provider]  carvedilol  (COREG ) 3.125 MG tablet TAKE ONE TABLET BY MOUTH TWICE A DAY 09/25/23   Alvan Dorn FALCON, MD  cycloSPORINE  (RESTASIS ) 0.05 % ophthalmic emulsion Place 1 drop into both eyes 2 (two) times daily.    [provider]  dapagliflozin  propanediol (FARXIGA ) 5 MG TABS tablet Take 2.5 mg by mouth daily before breakfast. Patient taking differently: Take 5 mg by mouth daily before breakfast. 12/26/19   Kassie Mallick, MD  docusate sodium  (COLACE) 100 MG capsule Take 1  capsule (100 mg total) by mouth 2 (two) times daily. 06/06/20   Debby Dorn MATSU, MD  escitalopram (LEXAPRO) 20 MG tablet Take 20 mg by mouth daily. 08/11/22   [provider]  estradiol  (ESTRACE ) 1 MG tablet TAKE ONE TABLET (1MG  TOTAL) BY MOUTH DAILY Patient taking differently: Take 1 mg by mouth daily. 02/19/20   Edsel Norleen GAILS, MD  fluticasone  (FLONASE ) 50 MCG/ACT nasal spray Place 2 sprays into the nose at bedtime. 09/04/20 08/13/79  [provider]  furosemide  (LASIX ) 20 MG tablet May take Lasix  20 mg as NEEDED for leg swelling 03/13/15   Alvan Dorn FALCON, MD  HYDROcodone -acetaminophen  (NORCO/VICODIN) 5-325 MG tablet Take 1 tablet by mouth every 6 (six) hours as needed for moderate pain.     [provider]  levothyroxine  (SYNTHROID ) 75 MCG tablet Take 75 mcg by mouth daily. 08/01/22  [provider]  metFORMIN  (GLUCOPHAGE -XR) 500 MG 24 hr tablet TAKE TWO (2) TABLETS BY MOUTH DAILY 11/30/21   Kassie Mallick, MD  methocarbamol  (ROBAXIN ) 500 MG tablet Take 1 tablet (500 mg total) by mouth 2 (two) times daily. 04/02/20   Harris, Abigail, PA-C  Multiple Vitamins-Minerals (MULTIVITAMINS THER. W/MINERALS) TABS Take 1 tablet by mouth every morning.    [provider]  omeprazole  (PRILOSEC) 20 MG capsule Take 20 mg by mouth every morning.    [provider]  pregabalin  (LYRICA ) 150 MG capsule TAKE ONE CAPSULE (150 MG TOTAL) BY MOUTHTWO TIMES DAILY. 06/27/20   Edsel Norleen GAILS, MD  repaglinide  (PRANDIN ) 1 MG tablet TAKE ONE TABLET BY MOUTH WITH BREAKFAST,4 TABLETS WITH LUNCH, AND 2 TABLETS [facility] 03/10/22   Kassie Mallick, MD  rizatriptan  (MAXALT -MLT) 10 MG disintegrating tablet Take 10 mg by mouth as needed for migraine. May repeat in 2 hours if needed    [provider]  traMADol  (ULTRAM ) 50 MG tablet Take 1 tablet (50 mg total) by mouth every 6 (six) hours as needed. 04/02/20   Arloa Chroman, PA-C  TRINTELLIX  10 MG TABS tablet Take 10 mg by  mouth daily. 10/05/22   [provider]  UNIFINE PENTIPS 31G X 6 MM MISC USE TO INJECT VICTOZA  ONCE DAILY 01/10/22   Kassie Mallick, MD  venlafaxine  (EFFEXOR -XR) 150 MG 24 hr capsule Take 150 mg by mouth every morning.    [provider]  Vibegron (GEMTESA) 75 MG TABS Take 75 mg by mouth at bedtime.    [provider]  VICTOZA  18 MG/3ML SOPN INJECT 0.3MLS (1.8MG  TOTAL) INTO THE SKIN DAILY 03/28/22   Kassie Mallick, MD    Physical Exam: Vitals:   12/26/23 0615 12/26/23 0642 12/26/23 0721 12/26/23 0730  BP: (!) 84/69 91/70  (!) 96/59  Pulse: 83 77  80  Resp:  20  18  Temp:   98.3 F (36.8 C)   TempSrc:   Axillary   SpO2: 93% 95%  90%  Weight:      Height:       Constitutional: NAD, calm  Eyes: PERRL, lids and conjunctivae normal ENMT: Mucous membranes are moist. Posterior pharynx clear of any exudate or lesions. Neck: normal, supple, no masses, no thyromegaly Respiratory: clear to auscultation bilaterally, no wheezing, no crackles. Normal respiratory effort. No accessory muscle use.  Cardiovascular: Regular rate and rhythm, no murmurs / rubs / gallops. No extremity edema. 2+ radial and pedal pulses. No carotid bruits.  Abdomen: no tenderness, no masses palpated. No hepatosplenomegaly. Bowel sounds positive x4  Musculoskeletal: no clubbing / cyanosis. No joint deformity upper and lower extremities. Limited ROM of (L) shoulder and pain with movement of (L) shoulder, no contractures. Normal muscle tone.  Skin: no rashes, lesions, ulcers. No induration Neurologic: Alert, confused. Oriented to self only.   Data Reviewed: CBC    Component Value Date/Time   WBC 9.0 12/25/2023 2318   RBC 4.95 12/25/2023 2318   HGB 14.9 12/25/2023 2318   HCT 44.3 12/25/2023 2318   PLT 164 12/25/2023 2318   MCV 89.5 12/25/2023 2318   MCH 30.1 12/25/2023 2318   MCHC 33.6 12/25/2023 2318   RDW 14.4 12/25/2023 2318   LYMPHSABS 1.0 12/25/2023 2318   MONOABS 0.7 12/25/2023 2318    EOSABS 0.0 12/25/2023 2318   BASOSABS 0.1 12/25/2023 2318   CMP     Component Value Date/Time   NA 134 (L) 12/25/2023 2318   K 3.9 12/25/2023 2318  CL 103 12/25/2023 2318   CO2 19 (L) 12/25/2023 2318   GLUCOSE 91 12/25/2023 2318   BUN 12 12/25/2023 2318   CREATININE 0.80 12/25/2023 2318   CREATININE 0.72 10/03/2016 0706   CALCIUM  8.5 (L) 12/25/2023 2318   PROT 6.6 12/25/2023 2318   ALBUMIN 3.2 (L) 12/25/2023 2318   AST 51 (H) 12/25/2023 2318   ALT 26 12/25/2023 2318   ALKPHOS 32 (L) 12/25/2023 2318   BILITOT 1.5 (H) 12/25/2023 2318   GFR 77.09 10/28/2020 1447   GFRNONAA >60 12/25/2023 2318   Cardiac Panel (last 3 results) Recent Labs    12/25/23 2318 12/26/23 0121  CKTOTAL 2,153*  --   TROPONINIHS 7 7   BNP    Component Value Date/Time   BNP 475.0 (H) 12/25/2023 2330   Alcohol Level    Component Value Date/Time   ETH <10 12/25/2023 2318   CK    Component Value Date/Time   TOTAL CK 2153 12/25/2023 2318   Lactic Acid, Venous    Component Value Date/Time   LATICACIDVEN 2.1 (H) 12/25/2023 2330   Lactic Acid, Venous    Component Value Date/Time   LATICACIDVEN 1.6 12/26/2023 0121   Urinalysis    Component Value Date/Time   COLORURINE YELLOW 12/26/2023 0220   APPEARANCEUR TURBID (A) 12/26/2023 0220   LABSPEC 1.022 12/26/2023 0220   PHURINE 5.0 12/26/2023 0220   GLUCOSEU >=500 (A) 12/26/2023 0220   GLUCOSEU >=1000 (A) 06/25/2020 1441   HGBUR LARGE (A) 12/26/2023 0220   BILIRUBINUR NEGATIVE 12/26/2023 0220   BILIRUBINUR neg 03/19/2013 1512   KETONESUR 20 (A) 12/26/2023 0220   PROTEINUR 100 (A) 12/26/2023 0220   UROBILINOGEN 0.2 06/25/2020 1441   NITRITE POSITIVE (A) 12/26/2023 0220   LEUKOCYTESUR LARGE (A) 12/26/2023 0220   DG Shoulder Left Port Result Date: 12/26/2023 CLINICAL DATA:  Post fall, now with left shoulder pain. EXAM: LEFT SHOULDER COMPARISON:  03/10/2022; 02/14/2022 FINDINGS: No fracture or dislocation. Mild degenerative change of the  glenohumeral and acromioclavicular joints with joint space loss, subchondral sclerosis and osteophytosis. No evidence of calcific tendinitis. Limited visualization of the adjacent thorax is normal given obliquity and technique. Regional soft tissues appear normal. IMPRESSION: 1. No acute findings. 2. Mild degenerative change of the left glenohumeral and acromioclavicular joints. Electronically Signed   By: Norleen Roulette M.D.   On: 12/26/2023 08:24   DG Chest Port 1 View Result Date: 12/26/2023 CLINICAL DATA:  Questionable sepsis - evaluate for abnormality EXAM: PORTABLE CHEST 1 VIEW COMPARISON:  Chest CT performed concurrently. FINDINGS: The cardiomediastinal contours are normal. Aortic atherosclerosis. Pulmonary vasculature is normal. No consolidation, pleural effusion, or pneumothorax. No acute osseous abnormalities are seen. IMPRESSION: No active disease. Electronically Signed   By: Andrea Gasman M.D.   On: 12/26/2023 01:54   CT CHEST ABDOMEN PELVIS WO CONTRAST Result Date: 12/26/2023 CLINICAL DATA:  Unwitnessed fall. EXAM: CT CHEST, ABDOMEN AND PELVIS WITHOUT CONTRAST TECHNIQUE: Multidetector CT imaging of the chest, abdomen and pelvis was performed following the standard protocol without IV contrast. RADIATION DOSE REDUCTION: This exam was performed according to the departmental dose-optimization program which includes automated exposure control, adjustment of the mA and/or kV according to patient size and/or use of iterative reconstruction technique. COMPARISON:  Noncontrast CT 04/02/2020 FINDINGS: CT CHEST FINDINGS Cardiovascular: Assessment for acute injury is limited in the absence of IV contrast. The heart is normal in size. Mitral annulus calcifications. Atherosclerosis and tortuosity of the thoracic aorta. No pericardial effusion. Mediastinum/Nodes: No mediastinal adenopathy. No  mediastinal hemorrhage. Hilar assessment is limited in the absence of IV contrast. Postsurgical change at the  gastroesophageal junction. No pneumomediastinum. Lungs/Pleura: No pneumothorax. No focal airspace disease or pulmonary contusion. No pleural effusion. No pulmonary mass. There is mild breathing motion artifact. Musculoskeletal: No acute fracture of the ribs, sternum, included clavicles or shoulder girdles. Degenerative change in the thoracic spine without acute fracture. No confluent chest wall contusion. CT ABDOMEN PELVIS FINDINGS Noncontrast CT imaging in the setting of acute trauma significantly limits the detection of subtle though potentially significant traumatic findings. Hepatobiliary: No focal liver abnormality or evidence of injury. No perihepatic hematoma. Clips in the gallbladder fossa postcholecystectomy. No biliary dilatation. Pancreas: Parenchymal atrophy. No evidence of injury. No ductal dilatation or inflammation. Chronic calcification of the pancreatic head. Spleen: Homogeneous attenuation.  No perisplenic hematoma. Adrenals/Urinary Tract: No adrenal hemorrhage. Unchanged left adrenal adenoma, needing no further imaging follow-up. No evidence of renal injury or hydronephrosis. Again seen right renal cyst. No further follow-up imaging is recommended. Decompressed urinary bladder, not well assessed Stomach/Bowel: Postsurgical change at the gastroesophageal junction. No evidence of bowel inflammation or injury. No obstruction. Moderate stool in the distal colon. Mild left colonic diverticulosis. No diverticulitis. Vascular/Lymphatic: Mild aortic atherosclerosis. No retroperitoneal fluid. No adenopathy. Reproductive: Status post hysterectomy. No adnexal masses. Other: No ascites or free air. Small fat containing supraumbilical midline ventral abdominal wall hernia. Chronic soft tissue density in the upper anterior abdominal wall may be scarring or related to medication injection sites. Musculoskeletal: No acute fracture of the pelvis or lumbar spine. IMPRESSION: 1. No acute traumatic injury of the  chest, abdomen, or pelvis. 2. Stable chronic findings as described. Aortic Atherosclerosis (ICD10-I70.0). Electronically Signed   By: Andrea Gasman M.D.   On: 12/26/2023 01:53   CT HEAD WO CONTRAST Result Date: 12/26/2023 CLINICAL DATA:  Fall EXAM: CT HEAD WITHOUT CONTRAST CT CERVICAL SPINE WITHOUT CONTRAST TECHNIQUE: Multidetector CT imaging of the head and cervical spine was performed following the standard protocol without intravenous contrast. Multiplanar CT image reconstructions of the cervical spine were also generated. RADIATION DOSE REDUCTION: This exam was performed according to the departmental dose-optimization program which includes automated exposure control, adjustment of the mA and/or kV according to patient size and/or use of iterative reconstruction technique. COMPARISON:  None Available. FINDINGS: CT HEAD FINDINGS Brain: Unchanged appearance of suprasellar mass. Chronic ischemic white matter changes. Mild generalized volume loss. No mass, hemorrhage or extra-axial collection. Old right cerebellar infarct. Vascular: No hyperdense vessel or unexpected vascular calcification. Skull: The visualized skull base, calvarium and extracranial soft tissues are normal. Sinuses/Orbits: No fluid levels or advanced mucosal thickening of the visualized paranasal sinuses. No mastoid or middle ear effusion. Normal orbits. Other: None. CT CERVICAL SPINE FINDINGS Alignment: No static subluxation. Facets are aligned. Occipital condyles are normally positioned. Skull base and vertebrae: No acute fracture. Soft tissues and spinal canal: No prevertebral fluid or swelling. No visible canal hematoma. Disc levels: No advanced spinal canal or neural foraminal stenosis. Upper chest: No pneumothorax, pulmonary nodule or pleural effusion. Other: Normal visualized paraspinal cervical soft tissues. IMPRESSION: 1. No acute intracranial abnormality. 2. No acute fracture or static subluxation of the cervical spine. 3.  Unchanged appearance of suprasellar mass. Electronically Signed   By: Franky Stanford M.D.   On: 12/26/2023 01:11   CT CERVICAL SPINE WO CONTRAST Result Date: 12/26/2023 CLINICAL DATA:  Fall EXAM: CT HEAD WITHOUT CONTRAST CT CERVICAL SPINE WITHOUT CONTRAST TECHNIQUE: Multidetector CT imaging of the head and cervical spine  was performed following the standard protocol without intravenous contrast. Multiplanar CT image reconstructions of the cervical spine were also generated. RADIATION DOSE REDUCTION: This exam was performed according to the departmental dose-optimization program which includes automated exposure control, adjustment of the mA and/or kV according to patient size and/or use of iterative reconstruction technique. COMPARISON:  None Available. FINDINGS: CT HEAD FINDINGS Brain: Unchanged appearance of suprasellar mass. Chronic ischemic white matter changes. Mild generalized volume loss. No mass, hemorrhage or extra-axial collection. Old right cerebellar infarct. Vascular: No hyperdense vessel or unexpected vascular calcification. Skull: The visualized skull base, calvarium and extracranial soft tissues are normal. Sinuses/Orbits: No fluid levels or advanced mucosal thickening of the visualized paranasal sinuses. No mastoid or middle ear effusion. Normal orbits. Other: None. CT CERVICAL SPINE FINDINGS Alignment: No static subluxation. Facets are aligned. Occipital condyles are normally positioned. Skull base and vertebrae: No acute fracture. Soft tissues and spinal canal: No prevertebral fluid or swelling. No visible canal hematoma. Disc levels: No advanced spinal canal or neural foraminal stenosis. Upper chest: No pneumothorax, pulmonary nodule or pleural effusion. Other: Normal visualized paraspinal cervical soft tissues. IMPRESSION: 1. No acute intracranial abnormality. 2. No acute fracture or static subluxation of the cervical spine. 3. Unchanged appearance of suprasellar mass. Electronically Signed    By: Franky Stanford M.D.   On: 12/26/2023 01:11      EKG Interpretation: NSR, HR 78; qtc normal  Assessment and Plan: #Sepsis secondary to Urinary Tract Infection Lactic Acidosis 2.1-->1.6, Hypotension BP 78/68  Urinalysis indicating UTI --> Leukocyte positive, Nitritie positive, many bacteria, >50 RBC, >50 WBC  - Empirically covered with IV Rocephin  ED, will continue inpatient - Add on Urine culture  - IVF- 3.5L LR received in ED, continue LR maintenance @ 100 mL/hr, Monitor respiratory status closely BNP 475 on admission - Follow culture data, follow fever curve  #Acute Metabolic Encephalopathy In setting of Sepsis and UTI, no focal deficits noted on exam. CT Head negative. - Treat as above - Swallow screen--> PASS  #Fall CT Head and C-spine negative. On exam patient with limited ROM of (L) shoulder and winces when shoulder palpated. Daughter reports patient was lying on (L) side when found. Patient lives independently, she fell at home and was unable to get up from the floor.  Family was unable to get hold of her, so a family member checked on her in the evening and was found on the floor confused.  - (L) Shoulder Xray - Lidocaine  patch - PRN Analgesia  #Rhabdomyolysis CK 2153 - LR at 100 mL/hr for 10 hours.  - Monitor respiratory status closely, BNP 475 on admission  #Elevated BNP BNP 475. No signs of volume overload on exam. CXR negative. - Repeat with AM labs - Monitor respiratory status closely with volume resuscitation  #Type II Diabetes Mellitus # Neuropathy Home oral agents held - ACHS CBG monitoring and SSI - Hold home Lyrica   #Hypertension Home antihypertensives held in setting of hypotension  #Hyperlipidemia - Continue home Lipitor  #Hypothyroidism - Continue home Synthroid   #Anxiety #Depression - Continue home Effexor     VTE prophylaxis: Lovenox  GI prophylaxis: Home Omeprazole  Diet: Carb Modified Access: PIV Lines: NONE Code Status:  FULL HCDM: Daughter Elgene Telemetry: Yes Disposition: Admit to [employer]   Advance Care Planning:   Code Status: Full Code confirmed with daughter Nelida via phone.   Consults: N/A  Family Communication: Son Francis updated at bedside in ED. Francis identified his sister Cortne as management consultant. Patient has 3  children in total.  Severity of Illness: The appropriate patient status for this patient is INPATIENT. Inpatient status is judged to be reasonable and necessary in order to provide the required intensity of service to ensure the patient's safety. The patient's presenting symptoms, physical exam findings, and initial radiographic and laboratory data in the context of their chronic comorbidities is felt to place them at high risk for further clinical deterioration. Furthermore, it is not anticipated that the patient will be medically stable for discharge from the hospital within 2 midnights of admission.   * I certify that at the point of admission it is my clinical judgment that the patient will require inpatient hospital care spanning beyond 2 midnights from the point of admission due to high intensity of service, high risk for further deterioration and high frequency of surveillance required.*  To reach the provider On-Call:   7AM- 7PM see care teams to locate the attending and reach out to them via [email or web address] Password: [email or web address] 7PM-7AM contact night-coverage If you still have difficulty reaching the appropriate provider, please page the [facility] (Director on Call) for [facility] on amion for assistance  This document was prepared using Sales executive software and may include unintentional dictation errors.  Rockie Rams FNP-BC, PMHNP-BC Nurse Practitioner [facility]

## 2023-12-26 NOTE — ED Notes (Signed)
 ED TO INPATIENT HANDOFF REPORT  ED Nurse Name and Phone #: Donnice Bars, Paramedic  S Name/Age/Gender [name] White 82 y.o. female Room/Bed: APA18/APA18  Code Status   Code Status: Full Code  Home/SNF/Other Home Patient oriented to: self Is this baseline? No   Triage Complete: Triage complete  Chief Complaint UTI (urinary tract infection) [N39.0] Sepsis due to urinary tract infection (HCC) [A41.9, N39.0]  Triage Note Pt was found in floor this evening and has been in the bedroom floor since this am. Pt is usually alert and oriented, but is altered at this time.    Allergies Allergies  Allergen Reactions   Betadine [Povidone Iodine] Hives   Penicillins Rash    Level of Care/Admitting Diagnosis ED Disposition     ED Disposition  Admit   Condition  --   Comment  Hospital Area: [facility] [[id number]]  Level of Care: ICU [6]  Covid Evaluation: Asymptomatic - no recent exposure (last 10 days) testing not required  Diagnosis: Sepsis due to urinary tract infection [facility]) [[id number]]  Admitting Physician: PEARLEAN RENDALL NEARING  Attending Physician: PEARLEAN RENDALL NEARING  Certification:: I certify this patient will need inpatient services for at least 2 midnights          B Medical/Surgery History Past Medical History:  Diagnosis Date   Anxiety    Asthma    ASYMPTOMATIC POSTMENOPAUSAL STATUS 02/09/2009   Cataract    Constipation 03/19/2013   Depression    DIABETES MELLITUS, TYPE II 09/14/2007   HYPERCHOLESTEROLEMIA 02/09/2009   HYPERTENSION 02/09/2009   HYPOTHYROIDISM 09/14/2007   MIGRAINE HEADACHE 09/14/2007   Neuropathy    OSTEOPOROSIS 02/09/2009   PANCREATITIS 09/14/2007   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   PONV (postoperative nausea and vomiting)    Rectocele 03/19/2013   Shortness of breath    SUPERFICIAL PHLEBITIS 09/14/2007   Varicose veins    Past Surgical History:  Procedure Laterality Date   ABDOMINAL HYSTERECTOMY     ANTERIOR AND  POSTERIOR REPAIR N/A 04/02/2013   Procedure: ANTERIOR (CYSTOCELE) AND POSTERIOR REPAIR (RECTOCELE);  Surgeon: Norleen LULLA Server, MD;  Location: [facility];  Service: Gynecology;  Laterality: N/A;   APPENDECTOMY     BRAIN SURGERY     CATARACT EXTRACTION W/PHACO  12/05/2011   Procedure: CATARACT EXTRACTION PHACO AND INTRAOCULAR LENS PLACEMENT (IOC);  Surgeon: Cherene Mania;  Location: [facility];  Service: Ophthalmology;  Laterality: Left;  CDE:9.65   CHOLECYSTECTOMY     CRANIOTOMY N/A 06/04/2020   Procedure: Endoscopic Transphenoidal Resection of Recurrent PituitaryTumor;  Surgeon: Colon Shove, MD;  Location: [facility] OR;  Service: Neurosurgery;  Laterality: N/A;   CYSTOSCOPY     ENDOVENOUS ABLATION SAPHENOUS VEIN W/ LASER  11-03-2011   right greater saphenous vein   left leg done 10-2011   EYE SURGERY  98   right cataract extraction 98   LAPAROSCOPIC NISSEN FUNDOPLICATION     NM ESOPHAGEAL REFLUX  08-11-11   PITUITARY EXCISION  10/1997   POSTERIOR REPAIR     TRANSNASAL APPROACH N/A 06/04/2020   Procedure: TRANSNASAL APPROACH;  Surgeon: Mable Lenis, MD;  Location: [facility] OR;  Service: ENT;  Laterality: N/A;   TRANSPHENOIDAL / TRANSNASAL HYPOPHYSECTOMY / RESECTION PITUITARY TUMOR  08-11-11     A IV Location/Drains/Wounds Patient Lines/Drains/Airways Status     Active Line/Drains/Airways     Name Placement date Placement time Site Days   Peripheral IV 12/25/23 18 G 1 Right Antecubital 12/25/23  2309  Antecubital  1   Peripheral IV  12/26/23 18 G 1.16 Anterior;Left Forearm 12/26/23  0916  Forearm  less than 1            Intake/Output Last 24 hours  Intake/Output Summary (Last 24 hours) at 12/26/2023 1045 Last data filed at 12/25/2023 2349 Gross per 24 hour  Intake 1000 ml  Output --  Net 1000 ml    Labs/Imaging Results for orders placed or performed during the hospital encounter of 12/25/23 (from the past 48 hours)  Comprehensive metabolic panel     Status: Abnormal   Collection Time:  12/25/23 11:18 PM  Result Value Ref Range   Sodium 134 (L) 135 - 145 mmol/L   Potassium 3.9 3.5 - 5.1 mmol/L   Chloride 103 98 - 111 mmol/L   CO2 19 (L) 22 - 32 mmol/L   Glucose, Bld 91 70 - 99 mg/dL    Comment: Glucose reference range applies only to samples taken after fasting for at least 8 hours.   BUN 12 8 - 23 mg/dL   Creatinine, Ser 9.19 0.44 - 1.00 mg/dL   Calcium  8.5 (L) 8.9 - 10.3 mg/dL   Total Protein 6.6 6.5 - 8.1 g/dL   Albumin 3.2 (L) 3.5 - 5.0 g/dL   AST 51 (H) 15 - 41 U/L   ALT 26 0 - 44 U/L   Alkaline Phosphatase 32 (L) 38 - 126 U/L   Total Bilirubin 1.5 (H) 0.0 - 1.2 mg/dL   GFR, Estimated >39 >39 mL/min    Comment: (NOTE) Calculated using the CKD-EPI Creatinine Equation (2021)    Anion gap 12 5 - 15    Comment: Performed at [facility], [address] [place] [address] [place], [place] [address]  CBC with Differential     Status: None   Collection Time: 12/25/23 11:18 PM  Result Value Ref Range   WBC 9.0 4.0 - 10.5 K/uL   RBC 4.95 3.87 - 5.11 MIL/uL   Hemoglobin 14.9 12.0 - 15.0 g/dL   HCT 55.6 63.9 - 53.9 %   MCV 89.5 80.0 - 100.0 fL   MCH 30.1 26.0 - 34.0 pg   MCHC 33.6 30.0 - 36.0 g/dL   RDW 85.5 88.4 - 84.4 %   Platelets 164 150 - 400 K/uL   nRBC 0.0 0.0 - 0.2 %   Neutrophils Relative % 80 %   Neutro Abs 7.1 1.7 - 7.7 K/uL   Lymphocytes Relative 11 %   Lymphs Abs 1.0 0.7 - 4.0 K/uL   Monocytes Relative 8 %   Monocytes Absolute 0.7 0.1 - 1.0 K/uL   Eosinophils Relative 0 %   Eosinophils Absolute 0.0 0.0 - 0.5 K/uL   Basophils Relative 1 %   Basophils Absolute 0.1 0.0 - 0.1 K/uL   Immature Granulocytes 0 %   Abs Immature Granulocytes 0.03 0.00 - 0.07 K/uL    Comment: Performed at [facility], [address], [place], [place] [address]  Blood Culture (routine x 2)     Status: None (Preliminary result)   Collection Time: 12/25/23 11:18 PM   Specimen: BLOOD  Result Value Ref Range   Specimen Description BLOOD BLOOD RIGHT ARM    Special Requests      BOTTLES  DRAWN AEROBIC ONLY Blood Culture adequate volume   Culture      NO GROWTH < 12 HOURS Performed at [facility], [address], [place], [place] [address]    Report Status PENDING   Troponin I (High Sensitivity)     Status: None  Collection Time: 12/25/23 11:18 PM  Result Value Ref Range   Troponin I (High Sensitivity) 7 <18 ng/L    Comment: (NOTE) Elevated high sensitivity troponin I (hsTnI) values and significant  changes across serial measurements may suggest ACS but many other  chronic and acute conditions are known to elevate hsTnI results.  Refer to the Links section for chest pain algorithms and additional  guidance. Performed at [facility], [address], [place], [place] [address]   Ethanol     Status: None   Collection Time: 12/25/23 11:18 PM  Result Value Ref Range   Alcohol, Ethyl (B) <10 <10 mg/dL    Comment: (NOTE) Lowest detectable limit for serum alcohol is 10 mg/dL.  For medical purposes only. Performed at [facility], [address] [place], [place] [address]   CK     Status: Abnormal   Collection Time: 12/25/23 11:18 PM  Result Value Ref Range   Total CK 2,153 (H) 38 - 234 U/L    Comment: Performed at [facility], [address], [place], [place] [address]  Lactic acid, plasma     Status: Abnormal   Collection Time: 12/25/23 11:30 PM  Result Value Ref Range   Lactic Acid, Venous 2.1 (HH) 0.5 - 1.9 mmol/L    Comment: CRITICAL RESULT CALLED TO, READ BACK BY AND VERIFIED WITH A. BRADSHAW AT 2358 ON 12.30.24 BY ADGER J Performed at [facility], [address], [place], [place] [address]   Blood Culture (routine x 2)     Status: None (Preliminary result)   Collection Time: 12/25/23 11:30 PM   Specimen: BLOOD  Result Value Ref Range   Specimen Description BLOOD BLOOD RIGHT HAND    Special Requests      BOTTLES DRAWN AEROBIC ONLY Blood Culture results may not be optimal due to an inadequate volume of blood received in culture bottles   Culture      NO  GROWTH < 12 HOURS Performed at [facility], [address] [place], [place] [address]    Report Status PENDING   Brain natriuretic peptide     Status: Abnormal   Collection Time: 12/25/23 11:30 PM  Result Value Ref Range   B Natriuretic Peptide 475.0 (H) 0.0 - 100.0 pg/mL    Comment: Performed at [facility], [address] [place], [place] [address]  TSH     Status: None   Collection Time: 12/25/23 11:30 PM  Result Value Ref Range   TSH 0.758 0.350 - 4.500 uIU/mL    Comment: Performed by a 3rd Generation assay with a functional sensitivity of <=0.01 uIU/mL. Performed at [facility], [address] [place], [place] [address]   Lactic acid, plasma     Status: None   Collection Time: 12/26/23  1:21 AM  Result Value Ref Range   Lactic Acid, Venous 1.6 0.5 - 1.9 mmol/L    Comment: Performed at [facility], [name] [place], [place] [address]  Troponin I (High Sensitivity)     Status: None   Collection Time: 12/26/23  1:21 AM  Result Value Ref Range   Troponin I (High Sensitivity) 7 <18 ng/L    Comment: (NOTE) Elevated high sensitivity troponin I (hsTnI) values and significant  changes across serial measurements may suggest ACS but many other  chronic and acute conditions are known to elevate hsTnI results.  Refer to the Links section for chest pain algorithms and additional  guidance. Performed at [facility], [address] [place],  [place] [address]   Urinalysis, w/ Reflex to Culture (Infection Suspected) -Urine, Clean Catch     Status: Abnormal   Collection Time: 12/26/23  2:20 AM  Result Value Ref Range   Specimen Source URINE, CLEAN CATCH    Color, Urine YELLOW YELLOW   APPearance TURBID (A) CLEAR   Specific Gravity, Urine 1.022 1.005 - 1.030   pH 5.0 5.0 - 8.0   Glucose, UA >=500 (A) NEGATIVE mg/dL   Hgb urine dipstick LARGE (A) NEGATIVE   Bilirubin Urine NEGATIVE NEGATIVE   Ketones, ur 20 (A) NEGATIVE mg/dL   Protein, ur 899 (A) NEGATIVE mg/dL   Nitrite  POSITIVE (A) NEGATIVE   Leukocytes,Ua LARGE (A) NEGATIVE   RBC / HPF >50 0 - 5 RBC/hpf   WBC, UA >50 0 - 5 WBC/hpf    Comment:        Reflex urine culture not performed if WBC <=10, OR if Squamous epithelial cells >5. If Squamous epithelial cells >5 suggest recollection.    Bacteria, UA MANY (A) NONE SEEN   Squamous Epithelial / HPF 6-10 0 - 5 /HPF   WBC Clumps PRESENT     Comment: Performed at [facility], [address] [place], [place] [address]  Rapid urine drug screen (hospital performed)     Status: None   Collection Time: 12/26/23  2:20 AM  Result Value Ref Range   Opiates NONE DETECTED NONE DETECTED   Cocaine NONE DETECTED NONE DETECTED   Benzodiazepines NONE DETECTED NONE DETECTED   Amphetamines NONE DETECTED NONE DETECTED   Tetrahydrocannabinol NONE DETECTED NONE DETECTED   Barbiturates NONE DETECTED NONE DETECTED    Comment: (NOTE) DRUG SCREEN FOR MEDICAL PURPOSES ONLY.  IF CONFIRMATION IS NEEDED FOR ANY PURPOSE, NOTIFY LAB WITHIN 5 DAYS.  LOWEST DETECTABLE LIMITS FOR URINE DRUG SCREEN Drug Class                     Cutoff (ng/mL) Amphetamine and metabolites    1000 Barbiturate and metabolites    200 Benzodiazepine                 200 Opiates and metabolites        300 Cocaine and metabolites        300 THC                            50 Performed at [facility], [address] [place], [place] [address]    DG Shoulder Left Port Result Date: 12/26/2023 CLINICAL DATA:  Post fall, now with left shoulder pain. EXAM: LEFT SHOULDER COMPARISON:  03/10/2022; 02/14/2022 FINDINGS: No fracture or dislocation. Mild degenerative change of the glenohumeral and acromioclavicular joints with joint space loss, subchondral sclerosis and osteophytosis. No evidence of calcific tendinitis. Limited visualization of the adjacent thorax is normal given obliquity and technique. Regional soft tissues appear normal. IMPRESSION: 1. No acute findings. 2. Mild degenerative change of the  left glenohumeral and acromioclavicular joints. Electronically Signed   By: Norleen Roulette M.D.   On: 12/26/2023 08:24   DG Chest Port 1 View Result Date: 12/26/2023 CLINICAL DATA:  Questionable sepsis - evaluate for abnormality EXAM: PORTABLE CHEST 1 VIEW COMPARISON:  Chest CT performed concurrently. FINDINGS: The cardiomediastinal contours are normal. Aortic atherosclerosis. Pulmonary vasculature is normal. No consolidation, pleural effusion, or pneumothorax. No acute osseous abnormalities are seen. IMPRESSION: No active disease. Electronically Signed   By: Andrea Marlee HERO.D.  On: 12/26/2023 01:54   CT CHEST ABDOMEN PELVIS WO CONTRAST Result Date: 12/26/2023 CLINICAL DATA:  Unwitnessed fall. EXAM: CT CHEST, ABDOMEN AND PELVIS WITHOUT CONTRAST TECHNIQUE: Multidetector CT imaging of the chest, abdomen and pelvis was performed following the standard protocol without IV contrast. RADIATION DOSE REDUCTION: This exam was performed according to the departmental dose-optimization program which includes automated exposure control, adjustment of the mA and/or kV according to patient size and/or use of iterative reconstruction technique. COMPARISON:  Noncontrast CT 04/02/2020 FINDINGS: CT CHEST FINDINGS Cardiovascular: Assessment for acute injury is limited in the absence of IV contrast. The heart is normal in size. Mitral annulus calcifications. Atherosclerosis and tortuosity of the thoracic aorta. No pericardial effusion. Mediastinum/Nodes: No mediastinal adenopathy. No mediastinal hemorrhage. Hilar assessment is limited in the absence of IV contrast. Postsurgical change at the gastroesophageal junction. No pneumomediastinum. Lungs/Pleura: No pneumothorax. No focal airspace disease or pulmonary contusion. No pleural effusion. No pulmonary mass. There is mild breathing motion artifact. Musculoskeletal: No acute fracture of the ribs, sternum, included clavicles or shoulder girdles. Degenerative change in the  thoracic spine without acute fracture. No confluent chest wall contusion. CT ABDOMEN PELVIS FINDINGS Noncontrast CT imaging in the setting of acute trauma significantly limits the detection of subtle though potentially significant traumatic findings. Hepatobiliary: No focal liver abnormality or evidence of injury. No perihepatic hematoma. Clips in the gallbladder fossa postcholecystectomy. No biliary dilatation. Pancreas: Parenchymal atrophy. No evidence of injury. No ductal dilatation or inflammation. Chronic calcification of the pancreatic head. Spleen: Homogeneous attenuation.  No perisplenic hematoma. Adrenals/Urinary Tract: No adrenal hemorrhage. Unchanged left adrenal adenoma, needing no further imaging follow-up. No evidence of renal injury or hydronephrosis. Again seen right renal cyst. No further follow-up imaging is recommended. Decompressed urinary bladder, not well assessed Stomach/Bowel: Postsurgical change at the gastroesophageal junction. No evidence of bowel inflammation or injury. No obstruction. Moderate stool in the distal colon. Mild left colonic diverticulosis. No diverticulitis. Vascular/Lymphatic: Mild aortic atherosclerosis. No retroperitoneal fluid. No adenopathy. Reproductive: Status post hysterectomy. No adnexal masses. Other: No ascites or free air. Small fat containing supraumbilical midline ventral abdominal wall hernia. Chronic soft tissue density in the upper anterior abdominal wall may be scarring or related to medication injection sites. Musculoskeletal: No acute fracture of the pelvis or lumbar spine. IMPRESSION: 1. No acute traumatic injury of the chest, abdomen, or pelvis. 2. Stable chronic findings as described. Aortic Atherosclerosis (ICD10-I70.0). Electronically Signed   By: Andrea Gasman M.D.   On: 12/26/2023 01:53   CT HEAD WO CONTRAST Result Date: 12/26/2023 CLINICAL DATA:  Fall EXAM: CT HEAD WITHOUT CONTRAST CT CERVICAL SPINE WITHOUT CONTRAST TECHNIQUE:  Multidetector CT imaging of the head and cervical spine was performed following the standard protocol without intravenous contrast. Multiplanar CT image reconstructions of the cervical spine were also generated. RADIATION DOSE REDUCTION: This exam was performed according to the departmental dose-optimization program which includes automated exposure control, adjustment of the mA and/or kV according to patient size and/or use of iterative reconstruction technique. COMPARISON:  None Available. FINDINGS: CT HEAD FINDINGS Brain: Unchanged appearance of suprasellar mass. Chronic ischemic white matter changes. Mild generalized volume loss. No mass, hemorrhage or extra-axial collection. Old right cerebellar infarct. Vascular: No hyperdense vessel or unexpected vascular calcification. Skull: The visualized skull base, calvarium and extracranial soft tissues are normal. Sinuses/Orbits: No fluid levels or advanced mucosal thickening of the visualized paranasal sinuses. No mastoid or middle ear effusion. Normal orbits. Other: None. CT CERVICAL SPINE FINDINGS Alignment: No static subluxation. Facets  are aligned. Occipital condyles are normally positioned. Skull base and vertebrae: No acute fracture. Soft tissues and spinal canal: No prevertebral fluid or swelling. No visible canal hematoma. Disc levels: No advanced spinal canal or neural foraminal stenosis. Upper chest: No pneumothorax, pulmonary nodule or pleural effusion. Other: Normal visualized paraspinal cervical soft tissues. IMPRESSION: 1. No acute intracranial abnormality. 2. No acute fracture or static subluxation of the cervical spine. 3. Unchanged appearance of suprasellar mass. Electronically Signed   By: Franky Stanford M.D.   On: 12/26/2023 01:11   CT CERVICAL SPINE WO CONTRAST Result Date: 12/26/2023 CLINICAL DATA:  Fall EXAM: CT HEAD WITHOUT CONTRAST CT CERVICAL SPINE WITHOUT CONTRAST TECHNIQUE: Multidetector CT imaging of the head and cervical spine was  performed following the standard protocol without intravenous contrast. Multiplanar CT image reconstructions of the cervical spine were also generated. RADIATION DOSE REDUCTION: This exam was performed according to the departmental dose-optimization program which includes automated exposure control, adjustment of the mA and/or kV according to patient size and/or use of iterative reconstruction technique. COMPARISON:  None Available. FINDINGS: CT HEAD FINDINGS Brain: Unchanged appearance of suprasellar mass. Chronic ischemic white matter changes. Mild generalized volume loss. No mass, hemorrhage or extra-axial collection. Old right cerebellar infarct. Vascular: No hyperdense vessel or unexpected vascular calcification. Skull: The visualized skull base, calvarium and extracranial soft tissues are normal. Sinuses/Orbits: No fluid levels or advanced mucosal thickening of the visualized paranasal sinuses. No mastoid or middle ear effusion. Normal orbits. Other: None. CT CERVICAL SPINE FINDINGS Alignment: No static subluxation. Facets are aligned. Occipital condyles are normally positioned. Skull base and vertebrae: No acute fracture. Soft tissues and spinal canal: No prevertebral fluid or swelling. No visible canal hematoma. Disc levels: No advanced spinal canal or neural foraminal stenosis. Upper chest: No pneumothorax, pulmonary nodule or pleural effusion. Other: Normal visualized paraspinal cervical soft tissues. IMPRESSION: 1. No acute intracranial abnormality. 2. No acute fracture or static subluxation of the cervical spine. 3. Unchanged appearance of suprasellar mass. Electronically Signed   By: Franky Stanford M.D.   On: 12/26/2023 01:11    Pending Labs Unresulted Labs (From admission, onward)     Start     Ordered   12/27/23 0500  Comprehensive metabolic panel  Tomorrow morning,   R        12/26/23 0738   12/27/23 0500  CBC  Tomorrow morning,   R        12/26/23 0738   12/27/23 0500  Brain natriuretic  peptide  Tomorrow morning,   R        12/26/23 0738   12/27/23 0500  CK  Tomorrow morning,   R        12/26/23 0833   12/26/23 1037  MRSA Next Gen by PCR, Nasal  Once,   R        12/26/23 1037   12/26/23 0814  Hemoglobin A1c  Once,   R       Comments: To assess prior glycemic control    12/26/23 0814   12/26/23 0720  Urine Culture (for pregnant, neutropenic or urologic patients or patients with an indwelling urinary catheter)  (Urine Labs)  Add-on,   AD       Question Answer Comment  Indication Sepsis   Patient immune status Normal      12/26/23 0719            Vitals/Pain Today's Vitals   12/26/23 0915 12/26/23 0930 12/26/23 0945 12/26/23 0950  BP: (!) 77/43 ROLLEN)  89/61 (!) 79/46 (!) 78/58  Pulse: 80 79 80 87  Resp: (!) 23 (!) 24 15 (!) 22  Temp:      TempSrc:      SpO2: 98% 93% 95% 93%  Weight:      Height:      PainSc:        Isolation Precautions No active isolations  Medications Medications  melatonin tablet 3 mg (has no administration in time range)  enoxaparin  (LOVENOX ) injection 40 mg (40 mg Subcutaneous Given 12/26/23 1030)  lactated ringers  infusion ( Intravenous New Bag/Given 12/26/23 0821)  cefTRIAXone  (ROCEPHIN ) 1 g in sodium chloride  0.9 % 100 mL IVPB (has no administration in time range)  acetaminophen  (TYLENOL ) tablet 650 mg (has no administration in time range)    Or  acetaminophen  (TYLENOL ) suppository 650 mg (has no administration in time range)  polyethylene glycol (MIRALAX  / GLYCOLAX ) packet 17 g (has no administration in time range)  ondansetron  (ZOFRAN ) tablet 4 mg (has no administration in time range)    Or  ondansetron  (ZOFRAN ) injection 4 mg (has no administration in time range)  albuterol  (PROVENTIL ) (2.5 MG/3ML) 0.083% nebulizer solution 2.5 mg (has no administration in time range)  insulin  aspart (novoLOG ) injection 0-9 Units (has no administration in time range)  insulin  aspart (novoLOG ) injection 0-5 Units (has no administration in  time range)  atorvastatin  (LIPITOR) tablet 40 mg (40 mg Oral Not Given 12/26/23 0957)  pantoprazole  (PROTONIX ) EC tablet 40 mg (40 mg Oral Not Given 12/26/23 0958)  venlafaxine  XR (EFFEXOR -XR) 24 hr capsule 150 mg (150 mg Oral Not Given 12/26/23 0958)  0.9 %  sodium chloride  infusion (250 mLs Intravenous New Bag/Given 12/26/23 1031)  norepinephrine  (LEVOPHED ) 4mg  in (0.016 mg/mL) premix infusion (2 mcg/min Intravenous New Bag/Given 12/26/23 1029)  lidocaine  (LIDODERM ) 5 % 1 patch (has no administration in time range)  levothyroxine  (SYNTHROID ) tablet 50 mcg (has no administration in time range)  Chlorhexidine  Gluconate Cloth 2 % PADS 6 each (has no administration in time range)  lactated ringers  bolus 2,000 mL (0 mLs Intravenous Stopped 12/25/23 2349)  cefTRIAXone  (ROCEPHIN ) 1 g in sodium chloride  0.9 % 100 mL IVPB (0 g Intravenous Stopped 12/26/23 0336)  lactated ringers  bolus 500 mL (0 mLs Intravenous Stopped 12/26/23 0518)  haloperidol  lactate (HALDOL ) injection 2 mg (2 mg Intramuscular Given 12/26/23 9367)  ketorolac  (TORADOL ) 15 MG/ML injection 7.5 mg (7.5 mg Intravenous Given 12/26/23 0825)  haloperidol  lactate (HALDOL ) injection 5 mg (5 mg Intramuscular Given 12/26/23 1043)    Mobility non-ambulatory     Focused Assessments Cardiac Assessment Handoff:  Cardiac Rhythm: Normal sinus rhythm Lab Results  Component Value Date   CKTOTAL 2,153 (H) 12/25/2023   No results found for: DDIMER Does the Patient currently have chest pain? No    R Recommendations: See Admitting Provider Note  Report given to:   Additional Notes: Significant AMS currently. Aox4 at baseline. Ambulatory at baseline. Levo at 3mcg has controlled BP.

## 2023-12-26 NOTE — ED Notes (Signed)
Pt is trying to get out of bed, very confused, keeps repeating "I don't know."  EDP made aware.

## 2023-12-26 NOTE — ED Provider Notes (Signed)
 I assumed care at signout to follow-up on imaging and labs.  No acute traumatic injury.  Patient does have evidence of UTI and early rhabdomyolysis On my exam patient is awake but appears confused.  She can move all 4 extremities Family at bedside confirmed it has been over 24 hours since she was last seen.  Due to continued delirium, UTI and early rhabdo, patient will be admitted she has received IV fluids and IV antibiotics  Discussed with Dr. Adefeso for admission to the stepdown unit patient has had a drop in her blood pressure  .Critical Care  Performed by: Midge Golas, MD Authorized by: Midge Golas, MD   Critical care provider statement:    Critical care time (minutes):  50   Critical care start time:  12/26/2023 2:30 AM   Critical care end time:  12/26/2023 3:20 AM   Critical care time was exclusive of:  Separately billable procedures and treating other patients   Critical care was necessary to treat or prevent imminent or life-threatening deterioration of the following conditions:  Sepsis, shock and CNS failure or compromise   Critical care was time spent personally by me on the following activities:  Examination of patient, ordering and review of laboratory studies, ordering and performing treatments and interventions, development of treatment plan with patient or surrogate and discussions with consultants   I assumed direction of critical care for this patient from another provider in my specialty: yes     Care discussed with: admitting provider       Midge Golas, MD 12/26/23 0321

## 2023-12-26 NOTE — ED Notes (Signed)
 ED TO INPATIENT HANDOFF REPORT  ED Nurse Name and Phone #: [name] White Name/Age/Gender [name] White Haymaker 82 y.o. female Room/Bed: APA11/APA11  Code Status   Code Status: Prior  Home/SNF/Other Skilled nursing facility Patient oriented to: self Is this baseline? Yes   Triage Complete: Triage complete  Chief Complaint UTI (urinary tract infection) [N39.0]  Triage Note Pt was found in floor this evening and has been in the bedroom floor since this am. Pt is usually alert and oriented, but is altered at this time.    Allergies Allergies  Allergen Reactions   Betadine [Povidone Iodine] Hives   Penicillins Rash    Level of Care/Admitting Diagnosis ED Disposition     ED Disposition  Admit   Condition  --   Comment  Hospital Area: [facility] [[id number]]  Level of Care: Stepdown [14]  Covid Evaluation: Asymptomatic - no recent exposure (last 10 days) testing not required  Diagnosis: UTI (urinary tract infection) [781136]  Admitting Physician: ADEFESO, OLADAPO [[id number]]  Attending Physician: ADEFESO, OLADAPO [[id number]]  Certification:: I certify this patient will need inpatient services for at least 2 midnights  Expected Medical Readiness: 12/29/2023          B Medical/Surgery History Past Medical History:  Diagnosis Date   Anxiety    Asthma    ASYMPTOMATIC POSTMENOPAUSAL STATUS 02/09/2009   Cataract    Constipation 03/19/2013   Depression    DIABETES MELLITUS, TYPE II 09/14/2007   HYPERCHOLESTEROLEMIA 02/09/2009   HYPERTENSION 02/09/2009   HYPOTHYROIDISM 09/14/2007   MIGRAINE HEADACHE 09/14/2007   Neuropathy    OSTEOPOROSIS 02/09/2009   PANCREATITIS 09/14/2007   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   PONV (postoperative nausea and vomiting)    Rectocele 03/19/2013   Shortness of breath    SUPERFICIAL PHLEBITIS 09/14/2007   Varicose veins    Past Surgical History:  Procedure Laterality Date   ABDOMINAL HYSTERECTOMY     ANTERIOR AND POSTERIOR REPAIR N/A 04/02/2013    Procedure: ANTERIOR (CYSTOCELE) AND POSTERIOR REPAIR (RECTOCELE);  Surgeon: Norleen LULLA Server, MD;  Location: [facility];  Service: Gynecology;  Laterality: N/A;   APPENDECTOMY     BRAIN SURGERY     CATARACT EXTRACTION W/PHACO  12/05/2011   Procedure: CATARACT EXTRACTION PHACO AND INTRAOCULAR LENS PLACEMENT (IOC);  Surgeon: Cherene Mania;  Location: [facility];  Service: Ophthalmology;  Laterality: Left;  CDE:9.65   CHOLECYSTECTOMY     CRANIOTOMY N/A 06/04/2020   Procedure: Endoscopic Transphenoidal Resection of Recurrent PituitaryTumor;  Surgeon: Colon Shove, MD;  Location: [facility];  Service: Neurosurgery;  Laterality: N/A;   CYSTOSCOPY     ENDOVENOUS ABLATION SAPHENOUS VEIN W/ LASER  11-03-2011   right greater saphenous vein   left leg done 10-2011   EYE SURGERY  98   right cataract extraction 98   LAPAROSCOPIC NISSEN FUNDOPLICATION     NM ESOPHAGEAL REFLUX  08-11-11   PITUITARY EXCISION  10/1997   POSTERIOR REPAIR     TRANSNASAL APPROACH N/A 06/04/2020   Procedure: TRANSNASAL APPROACH;  Surgeon: Mable Lenis, MD;  Location: [facility] OR;  Service: ENT;  Laterality: N/A;   TRANSPHENOIDAL / TRANSNASAL HYPOPHYSECTOMY / RESECTION PITUITARY TUMOR  08-11-11     A IV Location/Drains/Wounds Patient Lines/Drains/Airways Status     Active Line/Drains/Airways     Name Placement date Placement time Site Days   Peripheral IV 12/25/23 18 G 1 Right Antecubital 12/25/23  2309  Antecubital  1  Intake/Output Last 24 hours  Intake/Output Summary (Last 24 hours) at 12/26/2023 0344 Last data filed at 12/25/2023 2349 Gross per 24 hour  Intake 1000 ml  Output --  Net 1000 ml    Labs/Imaging Results for orders placed or performed during the hospital encounter of 12/25/23 (from the past 48 hours)  Comprehensive metabolic panel     Status: Abnormal   Collection Time: 12/25/23 11:18 PM  Result Value Ref Range   Sodium 134 (L) 135 - 145 mmol/L   Potassium 3.9 3.5 - 5.1 mmol/L   Chloride 103 98  - 111 mmol/L   CO2 19 (L) 22 - 32 mmol/L   Glucose, Bld 91 70 - 99 mg/dL    Comment: Glucose reference range applies only to samples taken after fasting for at least 8 hours.   BUN 12 8 - 23 mg/dL   Creatinine, Ser 9.19 0.44 - 1.00 mg/dL   Calcium  8.5 (L) 8.9 - 10.3 mg/dL   Total Protein 6.6 6.5 - 8.1 g/dL   Albumin 3.2 (L) 3.5 - 5.0 g/dL   AST 51 (H) 15 - 41 U/L   ALT 26 0 - 44 U/L   Alkaline Phosphatase 32 (L) 38 - 126 U/L   Total Bilirubin 1.5 (H) 0.0 - 1.2 mg/dL   GFR, Estimated >39 >39 mL/min    Comment: (NOTE) Calculated using the CKD-EPI Creatinine Equation (2021)    Anion gap 12 5 - 15    Comment: Performed at [facility], [address], [place], [place] [address]  CBC with Differential     Status: None   Collection Time: 12/25/23 11:18 PM  Result Value Ref Range   WBC 9.0 4.0 - 10.5 K/uL   RBC 4.95 3.87 - 5.11 MIL/uL   Hemoglobin 14.9 12.0 - 15.0 g/dL   HCT 55.6 63.9 - 53.9 %   MCV 89.5 80.0 - 100.0 fL   MCH 30.1 26.0 - 34.0 pg   MCHC 33.6 30.0 - 36.0 g/dL   RDW 85.5 88.4 - 84.4 %   Platelets 164 150 - 400 K/uL   nRBC 0.0 0.0 - 0.2 %   Neutrophils Relative % 80 %   Neutro Abs 7.1 1.7 - 7.7 K/uL   Lymphocytes Relative 11 %   Lymphs Abs 1.0 0.7 - 4.0 K/uL   Monocytes Relative 8 %   Monocytes Absolute 0.7 0.1 - 1.0 K/uL   Eosinophils Relative 0 %   Eosinophils Absolute 0.0 0.0 - 0.5 K/uL   Basophils Relative 1 %   Basophils Absolute 0.1 0.0 - 0.1 K/uL   Immature Granulocytes 0 %   Abs Immature Granulocytes 0.03 0.00 - 0.07 K/uL    Comment: Performed at [facility], [address], [place], [place] [address]  Blood Culture (routine x 2)     Status: None (Preliminary result)   Collection Time: 12/25/23 11:18 PM   Specimen: BLOOD  Result Value Ref Range   Specimen Description BLOOD BLOOD RIGHT ARM    Special Requests      BOTTLES DRAWN AEROBIC ONLY Blood Culture adequate volume Performed at [facility], [address] [place], [place] [address]    Culture  PENDING    Report Status PENDING   Troponin I (High Sensitivity)     Status: None   Collection Time: 12/25/23 11:18 PM  Result Value Ref Range   Troponin I (High Sensitivity) 7 <18 ng/L    Comment: (NOTE) Elevated high sensitivity troponin I (hsTnI) values and significant  changes across serial measurements may suggest ACS but many other  chronic and acute conditions are known to elevate hsTnI results.  Refer to the Links section for chest pain algorithms and additional  guidance. Performed at [facility], [address] [place], [place] [address]   Ethanol     Status: None   Collection Time: 12/25/23 11:18 PM  Result Value Ref Range   Alcohol, Ethyl (B) <10 <10 mg/dL    Comment: (NOTE) Lowest detectable limit for serum alcohol is 10 mg/dL.  For medical purposes only. Performed at [facility], [address], [place], [place] [address]   CK     Status: Abnormal   Collection Time: 12/25/23 11:18 PM  Result Value Ref Range   Total CK 2,153 (H) 38 - 234 U/L    Comment: Performed at [facility], [address], [place], [place] [address]  Lactic acid, plasma     Status: Abnormal   Collection Time: 12/25/23 11:30 PM  Result Value Ref Range   Lactic Acid, Venous 2.1 (HH) 0.5 - 1.9 mmol/L    Comment: CRITICAL RESULT CALLED TO, READ BACK BY AND VERIFIED WITH A. Shary Lamos AT 2358 ON 12.30.24 BY ADGER J Performed at [facility] [address], [place], [place] [address]   Blood Culture (routine x 2)     Status: None (Preliminary result)   Collection Time: 12/25/23 11:30 PM   Specimen: BLOOD  Result Value Ref Range   Specimen Description BLOOD BLOOD RIGHT HAND    Special Requests      BOTTLES DRAWN AEROBIC ONLY Blood Culture results may not be optimal due to an inadequate volume of blood received in culture bottles Performed at [facility], [address], [place], [place] [address]    Culture PENDING    Report Status PENDING   Brain natriuretic peptide     Status: Abnormal    Collection Time: 12/25/23 11:30 PM  Result Value Ref Range   B Natriuretic Peptide 475.0 (H) 0.0 - 100.0 pg/mL    Comment: Performed at [facility], [address], [place], [place] [address]  TSH     Status: None   Collection Time: 12/25/23 11:30 PM  Result Value Ref Range   TSH 0.758 0.350 - 4.500 uIU/mL    Comment: Performed by a 3rd Generation assay with a functional sensitivity of <=0.01 uIU/mL. Performed at [facility], [address] [place], [place] [address]   Lactic acid, plasma     Status: None   Collection Time: 12/26/23  1:21 AM  Result Value Ref Range   Lactic Acid, Venous 1.6 0.5 - 1.9 mmol/L    Comment: Performed at [facility], [address], [place], [place] [address]  Troponin I (High Sensitivity)     Status: None   Collection Time: 12/26/23  1:21 AM  Result Value Ref Range   Troponin I (High Sensitivity) 7 <18 ng/L    Comment: (NOTE) Elevated high sensitivity troponin I (hsTnI) values and significant  changes across serial measurements may suggest ACS but many other  chronic and acute conditions are known to elevate hsTnI results.  Refer to the Links section for chest pain algorithms and additional  guidance. Performed at [facility], [address], [place], [place] [address]   Urinalysis, w/ Reflex to Culture (Infection Suspected) -Urine, Clean Catch     Status: Abnormal   Collection Time: 12/26/23  2:20 AM  Result Value Ref Range   Specimen Source URINE, CLEAN CATCH  Color, Urine YELLOW YELLOW   APPearance TURBID (A) CLEAR   Specific Gravity, Urine 1.022 1.005 - 1.030   pH 5.0 5.0 - 8.0   Glucose, UA >=500 (A) NEGATIVE mg/dL   Hgb urine dipstick LARGE (A) NEGATIVE   Bilirubin Urine NEGATIVE NEGATIVE   Ketones, ur 20 (A) NEGATIVE mg/dL   Protein, ur 899 (A) NEGATIVE mg/dL   Nitrite POSITIVE (A) NEGATIVE   Leukocytes,Ua LARGE (A) NEGATIVE   RBC / HPF >50 0 - 5 RBC/hpf   WBC, UA >50 0 - 5 WBC/hpf    Comment:        Reflex urine culture not  performed if WBC <=10, OR if Squamous epithelial cells >5. If Squamous epithelial cells >5 suggest recollection.    Bacteria, UA MANY (A) NONE SEEN   Squamous Epithelial / HPF 6-10 0 - 5 /HPF   WBC Clumps PRESENT     Comment: Performed at [facility], [address] [place], [place] [address]  Rapid urine drug screen (hospital performed)     Status: None   Collection Time: 12/26/23  2:20 AM  Result Value Ref Range   Opiates NONE DETECTED NONE DETECTED   Cocaine NONE DETECTED NONE DETECTED   Benzodiazepines NONE DETECTED NONE DETECTED   Amphetamines NONE DETECTED NONE DETECTED   Tetrahydrocannabinol NONE DETECTED NONE DETECTED   Barbiturates NONE DETECTED NONE DETECTED    Comment: (NOTE) DRUG SCREEN FOR MEDICAL PURPOSES ONLY.  IF CONFIRMATION IS NEEDED FOR ANY PURPOSE, NOTIFY LAB WITHIN 5 DAYS.  LOWEST DETECTABLE LIMITS FOR URINE DRUG SCREEN Drug Class                     Cutoff (ng/mL) Amphetamine and metabolites    1000 Barbiturate and metabolites    200 Benzodiazepine                 200 Opiates and metabolites        300 Cocaine and metabolites        300 THC                            50 Performed at [facility], [address], [place], [place] [address]    DG Chest Port 1 View Result Date: 12/26/2023 CLINICAL DATA:  Questionable sepsis - evaluate for abnormality EXAM: PORTABLE CHEST 1 VIEW COMPARISON:  Chest CT performed concurrently. FINDINGS: The cardiomediastinal contours are normal. Aortic atherosclerosis. Pulmonary vasculature is normal. No consolidation, pleural effusion, or pneumothorax. No acute osseous abnormalities are seen. IMPRESSION: No active disease. Electronically Signed   By: Andrea Gasman M.D.   On: 12/26/2023 01:54   CT CHEST ABDOMEN PELVIS WO CONTRAST Result Date: 12/26/2023 CLINICAL DATA:  Unwitnessed fall. EXAM: CT CHEST, ABDOMEN AND PELVIS WITHOUT CONTRAST TECHNIQUE: Multidetector CT imaging of the chest, abdomen and pelvis was performed  following the standard protocol without IV contrast. RADIATION DOSE REDUCTION: This exam was performed according to the departmental dose-optimization program which includes automated exposure control, adjustment of the mA and/or kV according to patient size and/or use of iterative reconstruction technique. COMPARISON:  Noncontrast CT 04/02/2020 FINDINGS: CT CHEST FINDINGS Cardiovascular: Assessment for acute injury is limited in the absence of IV contrast. The heart is normal in size. Mitral annulus calcifications. Atherosclerosis and tortuosity of the thoracic aorta. No pericardial effusion. Mediastinum/Nodes: No mediastinal adenopathy. No mediastinal hemorrhage. Hilar assessment is limited in the absence of IV contrast. Postsurgical change at the  gastroesophageal junction. No pneumomediastinum. Lungs/Pleura: No pneumothorax. No focal airspace disease or pulmonary contusion. No pleural effusion. No pulmonary mass. There is mild breathing motion artifact. Musculoskeletal: No acute fracture of the ribs, sternum, included clavicles or shoulder girdles. Degenerative change in the thoracic spine without acute fracture. No confluent chest wall contusion. CT ABDOMEN PELVIS FINDINGS Noncontrast CT imaging in the setting of acute trauma significantly limits the detection of subtle though potentially significant traumatic findings. Hepatobiliary: No focal liver abnormality or evidence of injury. No perihepatic hematoma. Clips in the gallbladder fossa postcholecystectomy. No biliary dilatation. Pancreas: Parenchymal atrophy. No evidence of injury. No ductal dilatation or inflammation. Chronic calcification of the pancreatic head. Spleen: Homogeneous attenuation.  No perisplenic hematoma. Adrenals/Urinary Tract: No adrenal hemorrhage. Unchanged left adrenal adenoma, needing no further imaging follow-up. No evidence of renal injury or hydronephrosis. Again seen right renal cyst. No further follow-up imaging is recommended.  Decompressed urinary bladder, not well assessed Stomach/Bowel: Postsurgical change at the gastroesophageal junction. No evidence of bowel inflammation or injury. No obstruction. Moderate stool in the distal colon. Mild left colonic diverticulosis. No diverticulitis. Vascular/Lymphatic: Mild aortic atherosclerosis. No retroperitoneal fluid. No adenopathy. Reproductive: Status post hysterectomy. No adnexal masses. Other: No ascites or free air. Small fat containing supraumbilical midline ventral abdominal wall hernia. Chronic soft tissue density in the upper anterior abdominal wall may be scarring or related to medication injection sites. Musculoskeletal: No acute fracture of the pelvis or lumbar spine. IMPRESSION: 1. No acute traumatic injury of the chest, abdomen, or pelvis. 2. Stable chronic findings as described. Aortic Atherosclerosis (ICD10-I70.0). Electronically Signed   By: Andrea Gasman M.D.   On: 12/26/2023 01:53   CT HEAD WO CONTRAST Result Date: 12/26/2023 CLINICAL DATA:  Fall EXAM: CT HEAD WITHOUT CONTRAST CT CERVICAL SPINE WITHOUT CONTRAST TECHNIQUE: Multidetector CT imaging of the head and cervical spine was performed following the standard protocol without intravenous contrast. Multiplanar CT image reconstructions of the cervical spine were also generated. RADIATION DOSE REDUCTION: This exam was performed according to the departmental dose-optimization program which includes automated exposure control, adjustment of the mA and/or kV according to patient size and/or use of iterative reconstruction technique. COMPARISON:  None Available. FINDINGS: CT HEAD FINDINGS Brain: Unchanged appearance of suprasellar mass. Chronic ischemic white matter changes. Mild generalized volume loss. No mass, hemorrhage or extra-axial collection. Old right cerebellar infarct. Vascular: No hyperdense vessel or unexpected vascular calcification. Skull: The visualized skull base, calvarium and extracranial soft tissues  are normal. Sinuses/Orbits: No fluid levels or advanced mucosal thickening of the visualized paranasal sinuses. No mastoid or middle ear effusion. Normal orbits. Other: None. CT CERVICAL SPINE FINDINGS Alignment: No static subluxation. Facets are aligned. Occipital condyles are normally positioned. Skull base and vertebrae: No acute fracture. Soft tissues and spinal canal: No prevertebral fluid or swelling. No visible canal hematoma. Disc levels: No advanced spinal canal or neural foraminal stenosis. Upper chest: No pneumothorax, pulmonary nodule or pleural effusion. Other: Normal visualized paraspinal cervical soft tissues. IMPRESSION: 1. No acute intracranial abnormality. 2. No acute fracture or static subluxation of the cervical spine. 3. Unchanged appearance of suprasellar mass. Electronically Signed   By: Franky Stanford M.D.   On: 12/26/2023 01:11   CT CERVICAL SPINE WO CONTRAST Result Date: 12/26/2023 CLINICAL DATA:  Fall EXAM: CT HEAD WITHOUT CONTRAST CT CERVICAL SPINE WITHOUT CONTRAST TECHNIQUE: Multidetector CT imaging of the head and cervical spine was performed following the standard protocol without intravenous contrast. Multiplanar CT image reconstructions of the cervical spine  were also generated. RADIATION DOSE REDUCTION: This exam was performed according to the departmental dose-optimization program which includes automated exposure control, adjustment of the mA and/or kV according to patient size and/or use of iterative reconstruction technique. COMPARISON:  None Available. FINDINGS: CT HEAD FINDINGS Brain: Unchanged appearance of suprasellar mass. Chronic ischemic white matter changes. Mild generalized volume loss. No mass, hemorrhage or extra-axial collection. Old right cerebellar infarct. Vascular: No hyperdense vessel or unexpected vascular calcification. Skull: The visualized skull base, calvarium and extracranial soft tissues are normal. Sinuses/Orbits: No fluid levels or advanced mucosal  thickening of the visualized paranasal sinuses. No mastoid or middle ear effusion. Normal orbits. Other: None. CT CERVICAL SPINE FINDINGS Alignment: No static subluxation. Facets are aligned. Occipital condyles are normally positioned. Skull base and vertebrae: No acute fracture. Soft tissues and spinal canal: No prevertebral fluid or swelling. No visible canal hematoma. Disc levels: No advanced spinal canal or neural foraminal stenosis. Upper chest: No pneumothorax, pulmonary nodule or pleural effusion. Other: Normal visualized paraspinal cervical soft tissues. IMPRESSION: 1. No acute intracranial abnormality. 2. No acute fracture or static subluxation of the cervical spine. 3. Unchanged appearance of suprasellar mass. Electronically Signed   By: Franky Stanford M.D.   On: 12/26/2023 01:11    Pending Labs Unresulted Labs (From admission, onward)    None       Vitals/Pain Today's Vitals   12/26/23 0145 12/26/23 0215 12/26/23 0245 12/26/23 0311  BP: (!) 104/51 (!) 117/101 (!) 90/45   Pulse: 89 91 88   Resp: 20 20 18    Temp:    98.2 F (36.8 C)  TempSrc:    Axillary  SpO2: 94% 94% 97%   Weight:      Height:      PainSc:        Isolation Precautions No active isolations  Medications Medications  lactated ringers  bolus 2,000 mL (0 mLs Intravenous Stopped 12/25/23 2349)  cefTRIAXone  (ROCEPHIN ) 1 g in sodium chloride  0.9 % 100 mL IVPB (1 g Intravenous New Bag/Given 12/26/23 0306)    Mobility walks with device     Focused Assessments See chart   R Recommendations: See Admitting Provider Note  Report given to:   Additional Notes: see chart

## 2023-12-27 DIAGNOSIS — L8990 Pressure ulcer of unspecified site, unspecified stage: Secondary | ICD-10-CM | POA: Insufficient documentation

## 2023-12-27 DIAGNOSIS — A419 Sepsis, unspecified organism: Secondary | ICD-10-CM | POA: Diagnosis not present

## 2023-12-27 DIAGNOSIS — M6282 Rhabdomyolysis: Secondary | ICD-10-CM | POA: Diagnosis present

## 2023-12-27 DIAGNOSIS — R6521 Severe sepsis with septic shock: Secondary | ICD-10-CM | POA: Diagnosis not present

## 2023-12-27 LAB — GLUCOSE, CAPILLARY
Glucose-Capillary: 62 mg/dL — ABNORMAL LOW (ref 70–99)
Glucose-Capillary: 82 mg/dL (ref 70–99)
Glucose-Capillary: 77 mg/dL (ref 70–99)
Glucose-Capillary: 128 mg/dL — ABNORMAL HIGH (ref 70–99)
Glucose-Capillary: 91 mg/dL (ref 70–99)

## 2023-12-27 LAB — CBC
HCT: 35.9 % — ABNORMAL LOW (ref 36.0–46.0)
Hemoglobin: 11.6 g/dL — ABNORMAL LOW (ref 12.0–15.0)
MCH: 29.1 pg (ref 26.0–34.0)
MCHC: 32.3 g/dL (ref 30.0–36.0)
MCV: 90 fL (ref 80.0–100.0)
Platelets: 167 10*3/uL (ref 150–400)
RBC: 3.99 MIL/uL (ref 3.87–5.11)
RDW: 14.3 % (ref 11.5–15.5)
WBC: 10.5 10*3/uL (ref 4.0–10.5)
nRBC: 0 % (ref 0.0–0.2)

## 2023-12-27 LAB — COMPREHENSIVE METABOLIC PANEL
ALT: 27 U/L (ref 0–44)
AST: 45 U/L — ABNORMAL HIGH (ref 15–41)
Albumin: 2.7 g/dL — ABNORMAL LOW (ref 3.5–5.0)
Alkaline Phosphatase: 32 U/L — ABNORMAL LOW (ref 38–126)
Anion gap: 9 (ref 5–15)
BUN: 8 mg/dL (ref 8–23)
CO2: 22 mmol/L (ref 22–32)
Calcium: 7.9 mg/dL — ABNORMAL LOW (ref 8.9–10.3)
Chloride: 105 mmol/L (ref 98–111)
Creatinine, Ser: 0.65 mg/dL (ref 0.44–1.00)
GFR, Estimated: >60 mL/min (ref >60)
Glucose, Bld: 87 mg/dL (ref 70–99)
Potassium: 3.4 mmol/L — ABNORMAL LOW (ref 3.5–5.1)
Sodium: 136 mmol/L (ref 135–145)
Total Bilirubin: 1.2 mg/dL (ref 0.0–1.2)
Total Protein: 5.7 g/dL — ABNORMAL LOW (ref 6.5–8.1)

## 2023-12-27 LAB — CK: Total CK: 955 U/L — ABNORMAL HIGH (ref 38–234)

## 2023-12-27 MED ORDER — ATORVASTATIN CALCIUM 40 MG PO TABS: 40.0000 mg | ORAL_TABLET | Freq: Every day | ORAL | Status: DC

## 2023-12-27 MED ORDER — HYDROXYZINE HCL 25 MG PO TABS
25.0000 mg | ORAL_TABLET | Freq: Once | ORAL | Status: AC
  Administered 2023-12-28: 25 mg via ORAL
  Filled 2023-12-27: qty 1

## 2023-12-27 MED ORDER — DEXTROSE 50 % IV SOLN
INTRAVENOUS | Status: AC
  Administered 2023-12-27: 25 mL
  Filled 2023-12-27: qty 50

## 2023-12-27 MED ORDER — MELATONIN 3 MG PO TABS
6.0000 mg | ORAL_TABLET | Freq: Every evening | ORAL | Status: DC | PRN
  Administered 2023-12-28 – 2023-12-29 (×2): 6 mg via ORAL
  Filled 2023-12-27 (×2): qty 2

## 2023-12-27 MED ORDER — DEXTROSE-SODIUM CHLORIDE 5-0.45 % IV SOLN: INTRAVENOUS | Status: DC

## 2023-12-27 MED ORDER — POTASSIUM CHLORIDE CRYS ER 20 MEQ PO TBCR
40.0000 meq | EXTENDED_RELEASE_TABLET | ORAL | Status: AC
Start: 2023-12-27 — End: 2023-12-27
  Administered 2023-12-27 (×2): 40 meq via ORAL
  Filled 2023-12-27 (×2): qty 2

## 2023-12-27 NOTE — Progress Notes (Signed)
   12/27/23 1321  TOC Brief Assessment  Insurance and Status Reviewed  Patient has primary care physician Yes  Home environment has been reviewed Single Family Home  Prior level of function: Independent  Prior/Current Home Services No current home services  Social Drivers of Health Review SDOH reviewed no interventions necessary  Readmission risk has been reviewed Yes  Transition of care needs no transition of care needs at this time   Transition of Care Department [facility]) has reviewed patient and no TOC needs have been identified at this time. We will continue to monitor patient advancement through interdisciplinary progression rounds. If new patient transition needs arise, please place a TOC consult.

## 2023-12-27 NOTE — Progress Notes (Signed)
 PROGRESS NOTE     [name] White, is a 83 y.o. female, DOB - [birth year], FMW:[id number]  Admit date - 12/25/2023   Admitting Physician [name] Dlugosz Pearlean, MD  Outpatient Primary MD for the patient is [name] Norleen PEDLAR, MD  LOS - 1  Chief Complaint  Patient presents with   Fall        Brief Narrative:  83 y.o. female with medical history significant of hypertension, hyperlipidemia, osteoporosis, migraines, anxiety, asthma, depression, hypothyroidism, pituitary adenoma s/p resection, type 2 diabetes mellitus, neuropathy, chronic venous insufficiency admitted with severe sepsis with septic shock due to E. coli UTI on 12/26/2023    -Assessment and Plan: 1)Severe sepsis with septic shock due to E. coli UTI----POA -Septic shock resolved -Weaned off of IV Levophed  -Continue IV fluids and IV Rocephin  pending sensitivities -MRSA PCR negative --lactic acidosis resolved with IV fluids -Blood pressure has improved   2) status post fall and acute metabolic encephalopathy--due to #1 above -CT head without acute findings -Urine tox screen negative -- She did improve with hydration and IV antibiotics as above #1  3) episodes of hypoglycemia--- due to lethargy and poor oral intake -Currently requiring IV dextrose  infusion until oral intake is more reliable  4)Rhabdomyolysis--- patient had a fall at home prior to admission CK 2,153 >>955 -Improving with hydration -Stop atorvastatin   5) acute anemia--- hemoglobin is down to 11.6 from 14.9 on admission- -patient hemoglobin usually around 12 -Suspect that patient was hemoconcentrated on admission -Now developing anemia due to hemodilution from aggressive sepsis protocol IV fluids -No evidence of ongoing bleeding  6) hypokalemia--replace and recheck  7)Hypothyroidism--- continue levothyroxine   8)HTN--continue to hold Coreg , Lasix  and lisinopril  due to soft BP  9)DM2-A1c 6.0 reflecting excellent diabetic control PTA -PTA Victoza , Farxiga  and  metformin  are on hold -Hypoglycemic episode as above #3 due to poor oral intake -IV dextrose  as ordered  Status is: Inpatient   Disposition: The patient is from: Home              Anticipated d/c is to: Home              Anticipated d/c date is: 2 days              Patient currently is not medically stable to d/c. Barriers: Not Clinically Stable-   Code Status :  -  Code Status: Full Code   Family Communication:   (patient is alert, awake and coherent)  Daughter at bedside  DVT Prophylaxis  :   - SCDs  enoxaparin  (LOVENOX ) injection 40 mg Start: 12/26/23 1000   Lab Results  Component Value Date   PLT 167 12/27/2023    Inpatient Medications  Scheduled Meds:  aspirin  EC  81 mg Oral Daily   Chlorhexidine  Gluconate Cloth  6 each Topical Q0600   cycloSPORINE   1 drop Both Eyes BID   docusate sodium   100 mg Oral BID   enoxaparin  (LOVENOX ) injection  40 mg Subcutaneous Q24H   insulin  aspart  0-5 Units Subcutaneous QHS   insulin  aspart  0-9 Units Subcutaneous TID WC   levothyroxine   75 mcg Oral Q0600   lidocaine   1 patch Transdermal Q24H   pantoprazole   40 mg Oral Daily   potassium chloride   40 mEq Oral Q3H   venlafaxine  XR  150 mg Oral Q breakfast   Continuous Infusions:  cefTRIAXone  (ROCEPHIN )  IV 10 mL/hr at 12/27/23 1107   dextrose  5 % and 0.45 % NaCl 83 mL/hr at 12/27/23 1622  PRN Meds:.acetaminophen  **OR** acetaminophen , albuterol , melatonin, ondansetron  **OR** ondansetron  (ZOFRAN ) IV, polyethylene glycol, SUMAtriptan    Anti-infectives (From admission, onward)    Start     Dose/Rate Route Frequency Ordered Stop   12/27/23 0600  cefTRIAXone  (ROCEPHIN ) 1 g in sodium chloride  0.9 % 100 mL IVPB        1 g 200 mL/hr over 30 Minutes Intravenous Every 24 hours 12/26/23 0738 01/02/24 0559   12/26/23 0245  cefTRIAXone  (ROCEPHIN ) 1 g in sodium chloride  0.9 % 100 mL IVPB        1 g 200 mL/hr over 30 Minutes Intravenous  Once 12/26/23 0238 12/26/23 9663          Subjective: [name] White today has no fevers, no emesis,  No chest pain,  -Apparently received sedatives overnight due to agitation -Very sleepy today with poor oral intake - patient's daughter at bedside, questions answered -Had episode of hypoglycemia required IV dextrose    Objective: Vitals:   12/27/23 1144 12/27/23 1200 12/27/23 1435 12/27/23 1744  BP:  (!) 105/41 (!) 101/50 120/67  Pulse:  69 81 86  Resp:  20 20 20   Temp: 99.3 F (37.4 C)  98.4 F (36.9 C) 97.6 F (36.4 C)  TempSrc: Axillary  Oral Oral  SpO2:  94% 93% 97%  Weight:      Height:        Intake/Output Summary (Last 24 hours) at 12/27/2023 1801 Last data filed at 12/27/2023 1107 Gross per 24 hour  Intake 1798.44 ml  Output 450 ml  Net 1348.44 ml   Filed Weights   12/25/23 2216 12/26/23 1218  Weight: 60 kg 62.9 kg    Physical Exam  Gen:-Sleepy but arousable, in no acute distress HEENT:- [place].AT, No sclera icterus Neck-Supple Neck,No JVD,.  Lungs-  CTAB , fair symmetrical air movement CV- S1, S2 normal, regular  Abd-  +ve B.Sounds, Abd Soft, No tenderness, no CVA area tenderness Extremity/Skin:- No  edema, pedal pulses present  Psych-affect is flat, oriented x3 Neuro-generalized weakness, no new focal deficits, no tremors  Data Reviewed: I have personally reviewed following labs and imaging studies  CBC: Recent Labs  Lab 12/25/23 2318 12/27/23 0513  WBC 9.0 10.5  NEUTROABS 7.1  --   HGB 14.9 11.6*  HCT 44.3 35.9*  MCV 89.5 90.0  PLT 164 167   Basic Metabolic Panel: Recent Labs  Lab 12/25/23 2318 12/27/23 0513  NA 134* 136  K 3.9 3.4*  CL 103 105  CO2 19* 22  GLUCOSE 91 87  BUN 12 8  CREATININE 0.80 0.65  CALCIUM  8.5* 7.9*   GFR: Estimated Creatinine Clearance: 50.8 mL/min (by C-G formula based on SCr of 0.65 mg/dL). Liver Function Tests: Recent Labs  Lab 12/25/23 2318 12/27/23 0513  AST 51* 45*  ALT 26 27  ALKPHOS 32* 32*  BILITOT 1.5* 1.2  PROT 6.6 5.7*  ALBUMIN  3.2* 2.7*   Cardiac Enzymes: Recent Labs  Lab 12/25/23 2318 12/27/23 0513  CKTOTAL 2,153* 955*   HbA1C: Recent Labs    12/26/23 0121  HGBA1C 6.0*   Recent Results (from the past 240 hours)  Blood Culture (routine x 2)     Status: None (Preliminary result)   Collection Time: 12/25/23 11:18 PM   Specimen: BLOOD  Result Value Ref Range Status   Specimen Description BLOOD BLOOD RIGHT ARM  Final   Special Requests   Final    BOTTLES DRAWN AEROBIC ONLY Blood Culture adequate volume   Culture  Final    NO GROWTH 2 DAYS Performed at [facility], [address] [place], [place] [address]    Report Status PENDING  Incomplete  Blood Culture (routine x 2)     Status: None (Preliminary result)   Collection Time: 12/25/23 11:30 PM   Specimen: BLOOD  Result Value Ref Range Status   Specimen Description BLOOD BLOOD RIGHT HAND  Final   Special Requests   Final    BOTTLES DRAWN AEROBIC ONLY Blood Culture results may not be optimal due to an inadequate volume of blood received in culture bottles   Culture   Final    NO GROWTH 2 DAYS Performed at [facility], [address], [place], [place] [address]    Report Status PENDING  Incomplete  Urine Culture (for pregnant, neutropenic or urologic patients or patients with an indwelling urinary catheter)     Status: Abnormal (Preliminary result)   Collection Time: 12/26/23  2:20 AM   Specimen: Urine, Clean Catch  Result Value Ref Range Status   Specimen Description   Final    URINE, CLEAN CATCH Performed at [facility], [address], [place], [place] [address]    Special Requests   Final    Normal Performed at [facility], [address] [place], [place] [address]    Culture (A)  Final    >=100,000 COLONIES/mL ESCHERICHIA COLI SUSCEPTIBILITIES TO FOLLOW Performed at [facility], [address], [place], [place] [address]    Report Status PENDING  Incomplete  MRSA Next Gen by PCR, Nasal     Status: None   Collection Time:  12/26/23 10:37 AM   Specimen: Nasal Mucosa; Nasal Swab  Result Value Ref Range Status   MRSA by PCR Next Gen NOT DETECTED NOT DETECTED Final    Comment: (NOTE) The GeneXpert MRSA Assay (FDA approved for NASAL specimens only), is one component of a comprehensive MRSA colonization surveillance program. It is not intended to diagnose MRSA infection nor to guide or monitor treatment for MRSA infections. Test performance is not FDA approved in patients less than 2 years old. Performed at [facility], [address] [place], [place] [address]     Radiology Studies: DG Shoulder Left Port Result Date: 12/26/2023 CLINICAL DATA:  Post fall, now with left shoulder pain. EXAM: LEFT SHOULDER COMPARISON:  03/10/2022; 02/14/2022 FINDINGS: No fracture or dislocation. Mild degenerative change of the glenohumeral and acromioclavicular joints with joint space loss, subchondral sclerosis and osteophytosis. No evidence of calcific tendinitis. Limited visualization of the adjacent thorax is normal given obliquity and technique. Regional soft tissues appear normal. IMPRESSION: 1. No acute findings. 2. Mild degenerative change of the left glenohumeral and acromioclavicular joints. Electronically Signed   By: Norleen Roulette M.D.   On: 12/26/2023 08:24   DG Chest Port 1 View Result Date: 12/26/2023 CLINICAL DATA:  Questionable sepsis - evaluate for abnormality EXAM: PORTABLE CHEST 1 VIEW COMPARISON:  Chest CT performed concurrently. FINDINGS: The cardiomediastinal contours are normal. Aortic atherosclerosis. Pulmonary vasculature is normal. No consolidation, pleural effusion, or pneumothorax. No acute osseous abnormalities are seen. IMPRESSION: No active disease. Electronically Signed   By: Andrea Gasman M.D.   On: 12/26/2023 01:54   CT CHEST ABDOMEN PELVIS WO CONTRAST Result Date: 12/26/2023 CLINICAL DATA:  Unwitnessed fall. EXAM: CT CHEST, ABDOMEN AND PELVIS WITHOUT CONTRAST TECHNIQUE: Multidetector CT imaging of  the chest, abdomen and pelvis was performed following the standard protocol without IV contrast. RADIATION DOSE REDUCTION: This exam was performed  according to the departmental dose-optimization program which includes automated exposure control, adjustment of the mA and/or kV according to patient size and/or use of iterative reconstruction technique. COMPARISON:  Noncontrast CT 04/02/2020 FINDINGS: CT CHEST FINDINGS Cardiovascular: Assessment for acute injury is limited in the absence of IV contrast. The heart is normal in size. Mitral annulus calcifications. Atherosclerosis and tortuosity of the thoracic aorta. No pericardial effusion. Mediastinum/Nodes: No mediastinal adenopathy. No mediastinal hemorrhage. Hilar assessment is limited in the absence of IV contrast. Postsurgical change at the gastroesophageal junction. No pneumomediastinum. Lungs/Pleura: No pneumothorax. No focal airspace disease or pulmonary contusion. No pleural effusion. No pulmonary mass. There is mild breathing motion artifact. Musculoskeletal: No acute fracture of the ribs, sternum, included clavicles or shoulder girdles. Degenerative change in the thoracic spine without acute fracture. No confluent chest wall contusion. CT ABDOMEN PELVIS FINDINGS Noncontrast CT imaging in the setting of acute trauma significantly limits the detection of subtle though potentially significant traumatic findings. Hepatobiliary: No focal liver abnormality or evidence of injury. No perihepatic hematoma. Clips in the gallbladder fossa postcholecystectomy. No biliary dilatation. Pancreas: Parenchymal atrophy. No evidence of injury. No ductal dilatation or inflammation. Chronic calcification of the pancreatic head. Spleen: Homogeneous attenuation.  No perisplenic hematoma. Adrenals/Urinary Tract: No adrenal hemorrhage. Unchanged left adrenal adenoma, needing no further imaging follow-up. No evidence of renal injury or hydronephrosis. Again seen right renal cyst. No  further follow-up imaging is recommended. Decompressed urinary bladder, not well assessed Stomach/Bowel: Postsurgical change at the gastroesophageal junction. No evidence of bowel inflammation or injury. No obstruction. Moderate stool in the distal colon. Mild left colonic diverticulosis. No diverticulitis. Vascular/Lymphatic: Mild aortic atherosclerosis. No retroperitoneal fluid. No adenopathy. Reproductive: Status post hysterectomy. No adnexal masses. Other: No ascites or free air. Small fat containing supraumbilical midline ventral abdominal wall hernia. Chronic soft tissue density in the upper anterior abdominal wall may be scarring or related to medication injection sites. Musculoskeletal: No acute fracture of the pelvis or lumbar spine. IMPRESSION: 1. No acute traumatic injury of the chest, abdomen, or pelvis. 2. Stable chronic findings as described. Aortic Atherosclerosis (ICD10-I70.0). Electronically Signed   By: Andrea Gasman M.D.   On: 12/26/2023 01:53   CT HEAD WO CONTRAST Result Date: 12/26/2023 CLINICAL DATA:  Fall EXAM: CT HEAD WITHOUT CONTRAST CT CERVICAL SPINE WITHOUT CONTRAST TECHNIQUE: Multidetector CT imaging of the head and cervical spine was performed following the standard protocol without intravenous contrast. Multiplanar CT image reconstructions of the cervical spine were also generated. RADIATION DOSE REDUCTION: This exam was performed according to the departmental dose-optimization program which includes automated exposure control, adjustment of the mA and/or kV according to patient size and/or use of iterative reconstruction technique. COMPARISON:  None Available. FINDINGS: CT HEAD FINDINGS Brain: Unchanged appearance of suprasellar mass. Chronic ischemic white matter changes. Mild generalized volume loss. No mass, hemorrhage or extra-axial collection. Old right cerebellar infarct. Vascular: No hyperdense vessel or unexpected vascular calcification. Skull: The visualized skull base,  calvarium and extracranial soft tissues are normal. Sinuses/Orbits: No fluid levels or advanced mucosal thickening of the visualized paranasal sinuses. No mastoid or middle ear effusion. Normal orbits. Other: None. CT CERVICAL SPINE FINDINGS Alignment: No static subluxation. Facets are aligned. Occipital condyles are normally positioned. Skull base and vertebrae: No acute fracture. Soft tissues and spinal canal: No prevertebral fluid or swelling. No visible canal hematoma. Disc levels: No advanced spinal canal or neural foraminal stenosis. Upper chest: No pneumothorax, pulmonary nodule or pleural effusion. Other: Normal visualized paraspinal cervical soft  tissues. IMPRESSION: 1. No acute intracranial abnormality. 2. No acute fracture or static subluxation of the cervical spine. 3. Unchanged appearance of suprasellar mass. Electronically Signed   By: Franky Stanford M.D.   On: 12/26/2023 01:11   CT CERVICAL SPINE WO CONTRAST Result Date: 12/26/2023 CLINICAL DATA:  Fall EXAM: CT HEAD WITHOUT CONTRAST CT CERVICAL SPINE WITHOUT CONTRAST TECHNIQUE: Multidetector CT imaging of the head and cervical spine was performed following the standard protocol without intravenous contrast. Multiplanar CT image reconstructions of the cervical spine were also generated. RADIATION DOSE REDUCTION: This exam was performed according to the departmental dose-optimization program which includes automated exposure control, adjustment of the mA and/or kV according to patient size and/or use of iterative reconstruction technique. COMPARISON:  None Available. FINDINGS: CT HEAD FINDINGS Brain: Unchanged appearance of suprasellar mass. Chronic ischemic white matter changes. Mild generalized volume loss. No mass, hemorrhage or extra-axial collection. Old right cerebellar infarct. Vascular: No hyperdense vessel or unexpected vascular calcification. Skull: The visualized skull base, calvarium and extracranial soft tissues are normal.  Sinuses/Orbits: No fluid levels or advanced mucosal thickening of the visualized paranasal sinuses. No mastoid or middle ear effusion. Normal orbits. Other: None. CT CERVICAL SPINE FINDINGS Alignment: No static subluxation. Facets are aligned. Occipital condyles are normally positioned. Skull base and vertebrae: No acute fracture. Soft tissues and spinal canal: No prevertebral fluid or swelling. No visible canal hematoma. Disc levels: No advanced spinal canal or neural foraminal stenosis. Upper chest: No pneumothorax, pulmonary nodule or pleural effusion. Other: Normal visualized paraspinal cervical soft tissues. IMPRESSION: 1. No acute intracranial abnormality. 2. No acute fracture or static subluxation of the cervical spine. 3. Unchanged appearance of suprasellar mass. Electronically Signed   By: Franky Stanford M.D.   On: 12/26/2023 01:11   Scheduled Meds:  aspirin  EC  81 mg Oral Daily   Chlorhexidine  Gluconate Cloth  6 each Topical Q0600   cycloSPORINE   1 drop Both Eyes BID   docusate sodium   100 mg Oral BID   enoxaparin  (LOVENOX ) injection  40 mg Subcutaneous Q24H   insulin  aspart  0-5 Units Subcutaneous QHS   insulin  aspart  0-9 Units Subcutaneous TID WC   levothyroxine   75 mcg Oral Q0600   lidocaine   1 patch Transdermal Q24H   pantoprazole   40 mg Oral Daily   potassium chloride   40 mEq Oral Q3H   venlafaxine  XR  150 mg Oral Q breakfast   Continuous Infusions:  cefTRIAXone  (ROCEPHIN )  IV 10 mL/hr at 12/27/23 1107   dextrose  5 % and 0.45 % NaCl 83 mL/hr at 12/27/23 1622    LOS: 1 day   Rendall Carwin M.D on 12/27/2023 at 6:01 PM  Go to [email or web address] - for contact info  [facility] - Office  [phone]  If 7PM-7AM, please contact night-coverage [email or web address] 12/27/2023, 6:01 PM

## 2023-12-27 NOTE — Progress Notes (Signed)
 Pt 08:00 CBG 62.  Orange juice 4 oz given PO.  Pt rousable by sternal chest rub. Will recheck sugar per protocol.

## 2023-12-27 NOTE — Progress Notes (Signed)
 CBG recheck after 4 oz of orange juice was 82.

## 2023-12-27 NOTE — Progress Notes (Signed)
CBG is 120. 

## 2023-12-27 NOTE — Plan of Care (Signed)

## 2023-12-27 NOTE — Progress Notes (Signed)
 Held pt am dose of synthroid and effexor. Pt not rousable enough to swallow meds or eat breakfast. MD at bedside and is aware.  Agrees with plan to hold meds until patient is more awake.  Daughter in room and notified.

## 2023-12-28 DIAGNOSIS — R6521 Severe sepsis with septic shock: Secondary | ICD-10-CM | POA: Diagnosis not present

## 2023-12-28 DIAGNOSIS — A419 Sepsis, unspecified organism: Secondary | ICD-10-CM | POA: Diagnosis not present

## 2023-12-28 LAB — HEMOGLOBIN AND HEMATOCRIT, BLOOD
HCT: 35.8 % — ABNORMAL LOW (ref 36.0–46.0)
Hemoglobin: 11.6 g/dL — ABNORMAL LOW (ref 12.0–15.0)

## 2023-12-28 LAB — BASIC METABOLIC PANEL
Anion gap: 7 (ref 5–15)
BUN: 5 mg/dL — ABNORMAL LOW (ref 8–23)
CO2: 22 mmol/L (ref 22–32)
Calcium: 8.1 mg/dL — ABNORMAL LOW (ref 8.9–10.3)
Chloride: 109 mmol/L (ref 98–111)
Creatinine, Ser: 0.52 mg/dL (ref 0.44–1.00)
GFR, Estimated: >60 mL/min (ref >60)
Glucose, Bld: 142 mg/dL — ABNORMAL HIGH (ref 70–99)
Potassium: 3.5 mmol/L (ref 3.5–5.1)
Sodium: 138 mmol/L (ref 135–145)

## 2023-12-28 LAB — URINE CULTURE
Culture: >=100000 — AB
Special Requests: NORMAL

## 2023-12-28 LAB — GLUCOSE, CAPILLARY
Glucose-Capillary: 130 mg/dL — ABNORMAL HIGH (ref 70–99)
Glucose-Capillary: 155 mg/dL — ABNORMAL HIGH (ref 70–99)
Glucose-Capillary: 174 mg/dL — ABNORMAL HIGH (ref 70–99)
Glucose-Capillary: 189 mg/dL — ABNORMAL HIGH (ref 70–99)

## 2023-12-28 MED ORDER — HALOPERIDOL LACTATE 5 MG/ML IJ SOLN
5.0000 mg | Freq: Once | INTRAMUSCULAR | Status: AC
  Administered 2023-12-28: 5 mg via INTRAMUSCULAR
  Filled 2023-12-28: qty 1

## 2023-12-28 MED ORDER — LEVOTHYROXINE SODIUM 50 MCG PO TABS
50.0000 ug | ORAL_TABLET | Freq: Every day | ORAL | Status: DC
  Administered 2023-12-29 – 2024-01-02 (×5): 50 ug via ORAL
  Filled 2023-12-28 (×6): qty 1

## 2023-12-28 MED ORDER — DEXTROSE-SODIUM CHLORIDE 5-0.45 % IV SOLN: INTRAVENOUS | Status: DC

## 2023-12-28 MED ORDER — IPRATROPIUM-ALBUTEROL 0.5-2.5 (3) MG/3ML IN SOLN
3.0000 mL | Freq: Once | RESPIRATORY_TRACT | Status: AC
  Administered 2023-12-28: 3 mL via RESPIRATORY_TRACT
  Filled 2023-12-28: qty 3

## 2023-12-28 MED ORDER — PREGABALIN 75 MG PO CAPS
150.0000 mg | ORAL_CAPSULE | Freq: Two times a day (BID) | ORAL | Status: DC
  Administered 2023-12-28 – 2024-01-02 (×11): 150 mg via ORAL
  Filled 2023-12-28 (×11): qty 2

## 2023-12-28 MED ORDER — METHYLPREDNISOLONE SODIUM SUCC 125 MG IJ SOLR
60.0000 mg | Freq: Once | INTRAMUSCULAR | Status: AC
  Administered 2023-12-28: 60 mg via INTRAVENOUS
  Filled 2023-12-28: qty 2

## 2023-12-28 NOTE — Plan of Care (Signed)
  Problem: Fluid Volume: Goal: Ability to maintain a balanced intake and output will improve Outcome: Progressing   Problem: Health Behavior/Discharge Planning: Goal: Ability to identify and utilize available resources and services will improve Outcome: Progressing   Problem: Coping: Goal: Ability to adjust to condition or change in health will improve Outcome: Progressing

## 2023-12-28 NOTE — Progress Notes (Signed)
 PROGRESS NOTE   [name] White, is a 83 y.o. female, DOB - 05-18-1941, FMW:[id number]  Admit date - 12/25/2023   Admitting Physician Mearl Olver Pearlean, MD  Outpatient Primary MD for the patient is [name], MD  LOS - 2  Chief Complaint  Patient presents with   Fall       Brief Narrative:  83 y.o. female with medical history significant of hypertension, hyperlipidemia, osteoporosis, migraines, anxiety, asthma, depression, hypothyroidism, pituitary adenoma s/p resection, type 2 diabetes mellitus, neuropathy, chronic venous insufficiency admitted with severe sepsis with septic shock due to E. coli UTI on 12/26/2023 -Patient currently having acute metabolic encephalopathy and psychosis with behavioral disturbance--requiring as needed Haldol     -Assessment and Plan: 1)Severe sepsis with septic shock due to E. coli UTI----POA -Septic shock resolved -Weaned off of IV Levophed  -Continue IV fluids until oral intake is more reliable -MRSA PCR negative --lactic acidosis resolved with IV fluids -Blood pressure has improved -Urine culture from 12/26/2023 with mostly pansensitive E. Coli -Blood cultures from 12/25/2023 NGTD -Continue IV Rocephin  for another couple of days per sensitivity report   2) status post fall and acute metabolic encephalopathy--due to #1 above -CT head without acute findings -Urine tox screen negative -- She did improve with hydration and IV antibiotics as above #1  3) episodes of hypoglycemia--- due to lethargy and poor oral intake -Currently requiring IV dextrose  infusion until oral intake is more reliable  4)Rhabdomyolysis--- patient had a fall at home prior to admission CK 2,153 >>955 -Improving with hydration---repeat CPK in a.m. -Stopped atorvastatin   5) acute anemia--- hemoglobin is down to 11.6 from 14.9 on admission- -patient's baseline hemoglobin usually around 12 -Suspect that patient was hemoconcentrated on admission -Now developing anemia due to  hemodilution from aggressive sepsis protocol IV fluids -Hgb stable -No evidence of ongoing bleeding  6)Hypokalemia--replace and recheck  7)Hypothyroidism--- continue levothyroxine   8)HTN--continue to hold Coreg , Lasix  and lisinopril  due to soft BP  9)DM2-A1c 6.0 reflecting excellent diabetic control PTA -PTA Victoza , Farxiga  and metformin  are on hold -Hypoglycemic episode as above #3 due to poor oral intake -IV dextrose  as ordered  Status is: Inpatient   Disposition: The patient is from: Home              Anticipated d/c is to: Home              Anticipated d/c date is: 2 days              Patient currently is not medically stable to d/c. Barriers: Not Clinically Stable-   Code Status :  -  Code Status: Full Code   Family Communication:   Son at bedside, patient's daughter [name] White is on speaker phone  DVT Prophylaxis  :   - SCDs  enoxaparin  (LOVENOX ) injection 40 mg Start: 12/26/23 1000  Lab Results  Component Value Date   PLT 167 12/27/2023   Inpatient Medications Scheduled Meds:  aspirin  EC  81 mg Oral Daily   Chlorhexidine  Gluconate Cloth  6 each Topical Q0600   cycloSPORINE   1 drop Both Eyes BID   docusate sodium   100 mg Oral BID   enoxaparin  (LOVENOX ) injection  40 mg Subcutaneous Q24H   levothyroxine   75 mcg Oral Q0600   lidocaine   1 patch Transdermal Q24H   pantoprazole   40 mg Oral Daily   pregabalin   150 mg Oral BID   venlafaxine  XR  150 mg Oral Q breakfast   Continuous Infusions:  cefTRIAXone  (ROCEPHIN )  IV 1  g (12/28/23 0533)   dextrose  5 % and 0.45 % NaCl 50 mL/hr at 12/28/23 1000   PRN Meds:.acetaminophen  **OR** acetaminophen , albuterol , melatonin, ondansetron  **OR** ondansetron  (ZOFRAN ) IV, polyethylene glycol, SUMAtriptan    Anti-infectives (From admission, onward)    Start     Dose/Rate Route Frequency Ordered Stop   12/27/23 0600  cefTRIAXone  (ROCEPHIN ) 1 g in sodium chloride  0.9 % 100 mL IVPB        1 g 200 mL/hr over 30 Minutes Intravenous Every  24 hours 12/26/23 0738 01/02/24 0559   12/26/23 0245  cefTRIAXone  (ROCEPHIN ) 1 g in sodium chloride  0.9 % 100 mL IVPB        1 g 200 mL/hr over 30 Minutes Intravenous  Once 12/26/23 0238 12/26/23 9663      Subjective: [name] White today has no fevers, no emesis,  No chest pain,  -Patient is anxious, restless, patient's son is at bedside,  -patient's daughter [name] White is  on speaker phone -Patient has had episodes of tachypnea with O2 sats is 95 to 96% on room air  Objective: Vitals:   12/28/23 0535 12/28/23 0721 12/28/23 0724 12/28/23 0800  BP: 128/80 123/81  129/87  Pulse: 86 83  (!) 102  Resp: (!) 24 (!) 24  (!) 30  Temp: 97.9 F (36.6 C) 97.7 F (36.5 C)    TempSrc:  Oral    SpO2: 95% 95% 95% 94%  Weight:      Height:        Intake/Output Summary (Last 24 hours) at 12/28/2023 1001 Last data filed at 12/28/2023 0845 Gross per 24 hour  Intake 519.75 ml  Output --  Net 519.75 ml   Filed Weights   12/25/23 2216 12/26/23 1218  Weight: 60 kg 62.9 kg    Physical Exam Gen:-Restless, somewhat agitated, HEENT:- [place].AT, No sclera icterus Neck-Supple Neck,No JVD,.  Lungs-audible wheezing, fair air movement, tachypneic CV- S1, S2 normal, regular  Abd-  +ve B.Sounds, Abd Soft, No tenderness, no CVA area tenderness Extremity/Skin:- No  edema, pedal pulses present  Psych-affect is anxious, oriented x3 Neuro-generalized weakness, no new focal deficits, no tremors  Data Reviewed: I have personally reviewed following labs and imaging studies  CBC: Recent Labs  Lab 12/25/23 2318 12/27/23 0513 12/28/23 0434  WBC 9.0 10.5  --   NEUTROABS 7.1  --   --   HGB 14.9 11.6* 11.6*  HCT 44.3 35.9* 35.8*  MCV 89.5 90.0  --   PLT 164 167  --    Basic Metabolic Panel: Recent Labs  Lab 12/25/23 2318 12/27/23 0513 12/28/23 0434  NA 134* 136 138  K 3.9 3.4* 3.5  CL 103 105 109  CO2 19* 22 22  GLUCOSE 91 87 142*  BUN 12 8 5*  CREATININE 0.80 0.65 0.52  CALCIUM  8.5* 7.9* 8.1*    GFR: Estimated Creatinine Clearance: 50.8 mL/min (by C-G formula based on SCr of 0.52 mg/dL). Liver Function Tests: Recent Labs  Lab 12/25/23 2318 12/27/23 0513  AST 51* 45*  ALT 26 27  ALKPHOS 32* 32*  BILITOT 1.5* 1.2  PROT 6.6 5.7*  ALBUMIN 3.2* 2.7*   Cardiac Enzymes: Recent Labs  Lab 12/25/23 2318 12/27/23 0513  CKTOTAL 2,153* 955*   HbA1C: Recent Labs    12/26/23 0121  HGBA1C 6.0*   Recent Results (from the past 240 hours)  Blood Culture (routine x 2)     Status: None (Preliminary result)   Collection Time: 12/25/23 11:18 PM   Specimen: BLOOD  Result Value Ref Range Status   Specimen Description BLOOD BLOOD RIGHT ARM  Final   Special Requests   Final    BOTTLES DRAWN AEROBIC ONLY Blood Culture adequate volume   Culture   Final    NO GROWTH 3 DAYS Performed at [facility], [address], [place], [place] [address]    Report Status PENDING  Incomplete  Blood Culture (routine x 2)     Status: None (Preliminary result)   Collection Time: 12/25/23 11:30 PM   Specimen: BLOOD  Result Value Ref Range Status   Specimen Description BLOOD BLOOD RIGHT HAND  Final   Special Requests   Final    BOTTLES DRAWN AEROBIC ONLY Blood Culture results may not be optimal due to an inadequate volume of blood received in culture bottles   Culture   Final    NO GROWTH 3 DAYS Performed at [facility], [address] [place], [place] [address]    Report Status PENDING  Incomplete  Urine Culture (for pregnant, neutropenic or urologic patients or patients with an indwelling urinary catheter)     Status: Abnormal   Collection Time: 12/26/23  2:20 AM   Specimen: Urine, Clean Catch  Result Value Ref Range Status   Specimen Description   Final    URINE, CLEAN CATCH Performed at [facility], [address] [place], [place] [address]    Special Requests   Final    Normal Performed at [facility], [address], [place], [place] [address]    Culture >=100,000 COLONIES/mL  ESCHERICHIA COLI (A)  Final   Report Status 12/28/2023 FINAL  Final   Organism ID, Bacteria ESCHERICHIA COLI (A)  Final      Susceptibility   Escherichia coli - MIC*    AMPICILLIN <=2 SENSITIVE Sensitive     CEFAZOLIN  <=4 SENSITIVE Sensitive     CEFEPIME  <=0.12 SENSITIVE Sensitive     CEFTRIAXONE  <=0.25 SENSITIVE Sensitive     CIPROFLOXACIN  >=4 RESISTANT Resistant     GENTAMICIN <=1 SENSITIVE Sensitive     IMIPENEM <=0.25 SENSITIVE Sensitive     NITROFURANTOIN  <=16 SENSITIVE Sensitive     TRIMETH/SULFA <=20 SENSITIVE Sensitive     AMPICILLIN/SULBACTAM <=2 SENSITIVE Sensitive     PIP/TAZO <=4 SENSITIVE Sensitive ug/mL    * >=100,000 COLONIES/mL ESCHERICHIA COLI  MRSA Next Gen by PCR, Nasal     Status: None   Collection Time: 12/26/23 10:37 AM   Specimen: Nasal Mucosa; Nasal Swab  Result Value Ref Range Status   MRSA by PCR Next Gen NOT DETECTED NOT DETECTED Final    Comment: (NOTE) The GeneXpert MRSA Assay (FDA approved for NASAL specimens only), is one component of a comprehensive MRSA colonization surveillance program. It is not intended to diagnose MRSA infection nor to guide or monitor treatment for MRSA infections. Test performance is not FDA approved in patients less than 1 years old. Performed at [facility], [address] [place], [place] [address]     Radiology Studies: No results found.  Scheduled Meds:  aspirin  EC  81 mg Oral Daily   Chlorhexidine  Gluconate Cloth  6 each Topical Q0600   cycloSPORINE   1 drop Both Eyes BID   docusate sodium   100 mg Oral BID   enoxaparin  (LOVENOX ) injection  40 mg Subcutaneous Q24H   levothyroxine   75 mcg Oral Q0600   lidocaine   1 patch Transdermal Q24H   pantoprazole   40 mg Oral Daily   pregabalin   150 mg Oral BID  venlafaxine  XR  150 mg Oral Q breakfast   Continuous Infusions:  cefTRIAXone  (ROCEPHIN )  IV 1 g (12/28/23 0533)   dextrose  5 % and 0.45 % NaCl 50 mL/hr at 12/28/23 1000    LOS: 2 days   Rendall Carwin M.D on  12/28/2023 at 10:01 AM  Go to [email or web address] - for contact info  [facility] - Office  [phone]  If 7PM-7AM, please contact night-coverage [email or web address] 12/28/2023, 10:01 AM

## 2023-12-28 NOTE — Progress Notes (Signed)
 This nurse received report from night shift at bedside. Patient is very anxious and trying to get out of bed. Call light was in reach, patient repositioned in bed and fall mats in place. Patient is also wheezing with respirations in the 30s. MD Emokpae aware.

## 2023-12-29 DIAGNOSIS — A419 Sepsis, unspecified organism: Secondary | ICD-10-CM | POA: Diagnosis not present

## 2023-12-29 DIAGNOSIS — R6521 Severe sepsis with septic shock: Secondary | ICD-10-CM | POA: Diagnosis not present

## 2023-12-29 LAB — BASIC METABOLIC PANEL
Anion gap: 6 (ref 5–15)
BUN: 7 mg/dL — ABNORMAL LOW (ref 8–23)
CO2: 23 mmol/L (ref 22–32)
Calcium: 8.1 mg/dL — ABNORMAL LOW (ref 8.9–10.3)
Chloride: 109 mmol/L (ref 98–111)
Creatinine, Ser: 0.56 mg/dL (ref 0.44–1.00)
GFR, Estimated: >60 mL/min (ref >60)
Glucose, Bld: 149 mg/dL — ABNORMAL HIGH (ref 70–99)
Potassium: 3.6 mmol/L (ref 3.5–5.1)
Sodium: 138 mmol/L (ref 135–145)

## 2023-12-29 LAB — CK TOTAL AND CKMB (NOT AT ARMC)
CK, MB: 5.5 ng/mL — ABNORMAL HIGH (ref 0.5–5.0)
Total CK: 186 U/L (ref 38–234)

## 2023-12-29 LAB — GLUCOSE, CAPILLARY
Glucose-Capillary: 141 mg/dL — ABNORMAL HIGH (ref 70–99)
Glucose-Capillary: 116 mg/dL — ABNORMAL HIGH (ref 70–99)
Glucose-Capillary: 100 mg/dL — ABNORMAL HIGH (ref 70–99)

## 2023-12-29 LAB — CK: Total CK: 218 U/L (ref 38–234)

## 2023-12-29 MED ORDER — CEFADROXIL 500 MG PO CAPS
500.0000 mg | ORAL_CAPSULE | Freq: Two times a day (BID) | ORAL | Status: DC
  Administered 2023-12-30 – 2024-01-02 (×7): 500 mg via ORAL
  Filled 2023-12-29 (×9): qty 1

## 2023-12-29 MED ORDER — SODIUM CHLORIDE 0.9 % IV SOLN: INTRAVENOUS | Status: DC

## 2023-12-29 MED ORDER — ORAL CARE MOUTH RINSE
15.0000 mL | OROMUCOSAL | Status: DC | PRN
Start: 2023-12-29 — End: 2024-01-02

## 2023-12-29 MED ORDER — LEVOFLOXACIN 500 MG PO TABS
500.0000 mg | ORAL_TABLET | Freq: Every day | ORAL | Status: DC
  Administered 2023-12-29: 500 mg via ORAL
  Filled 2023-12-29: qty 1

## 2023-12-29 MED ORDER — RISAQUAD PO CAPS
2.0000 | ORAL_CAPSULE | Freq: Three times a day (TID) | ORAL | Status: DC
  Administered 2023-12-29 – 2024-01-02 (×12): 2 via ORAL
  Filled 2023-12-29 (×11): qty 2

## 2023-12-29 NOTE — Plan of Care (Signed)
  Problem: Acute Rehab PT Goals(only PT should resolve) Goal: Pt Will Go Supine/Side To Sit Outcome: Progressing Flowsheets (Taken 12/29/2023 1416) Pt will go Supine/Side to Sit: with minimal assist Goal: Patient Will Transfer Sit To/From Stand Outcome: Progressing Flowsheets (Taken 12/29/2023 1416) Patient will transfer sit to/from stand: with minimal assist Goal: Pt Will Transfer Bed To Chair/Chair To Bed Outcome: Progressing Flowsheets (Taken 12/29/2023 1416) Pt will Transfer Bed to Chair/Chair to Bed: with min assist Goal: Pt Will Ambulate Outcome: Progressing Flowsheets (Taken 12/29/2023 1416) Pt will Ambulate:  50 feet  with rolling walker  with minimal assist

## 2023-12-29 NOTE — Progress Notes (Addendum)
 PROGRESS NOTE   [name] White, is a 83 y.o. female, DOB - [birth year], FMW:[id number]  Admit date - 12/25/2023   Admitting Physician Courage Pearlean, MD  Outpatient Primary MD for the patient is [name], MD  LOS - 3  Chief Complaint  Patient presents with   Fall       Brief Narrative:  83 y.o. female with medical history significant of hypertension, hyperlipidemia, osteoporosis, migraines, anxiety, asthma, depression, hypothyroidism, pituitary adenoma s/p resection, type 2 diabetes mellitus, neuropathy, chronic venous insufficiency admitted with severe sepsis with septic shock due to E. coli UTI on 12/26/2023 -Patient currently having acute metabolic encephalopathy and psychosis with behavioral disturbance--requiring as needed Haldol     Subjective:  [name] White was seen and examined this morning Stable laying in bed, mildly anxious-family present at bedside Has been successfully weaned off supplemental oxygen, currently on room air satting 96%    -Assessment and Plan: 1)Severe sepsis with septic shock due to E. coli UTI----POA -Septic shock and physiology resolved -Weaned off of IV Levophed  -Discontinuing IV fluids-oral intake improving -MRSA PCR negative --lactic acidosis resolved with IV fluids -BP stable this morning -Urine culture from 12/26/2023 with mostly pansensitive E. Coli -Blood cultures from 12/25/2023 NGTD -Was on IV Rocephin  due to sensitivity, will change to p.o. antibiotics of cefadroxi    2) status post fall and acute metabolic encephalopathy--due to #1 above -Mentation back to baseline -CT head without acute findings -Urine tox screen negative   3) episodes of hypoglycemia--- due to lethargy and poor oral intake -Currently requiring IV dextrose  infusion until -oral intake is improved,   4)Rhabdomyolysis--- patient had a fall at home prior to admission CK 2,153 >>955 >>> 218  Continue IV fluids -Improving with hydration---monitoring CPK and  creatinine -Stopped atorvastatin   5) acute anemia--- hemoglobin is down to 11.6 from 14.9 on admission- -patient's baseline hemoglobin usually around 12 -Suspect that patient was hemoconcentrated on admission -Now developing anemia due to hemodilution from aggressive sepsis protocol IV fluids -Hgb stable -No evidence of ongoing bleeding  6)Hypokalemia--replace and recheck  7)Hypothyroidism--- continue levothyroxine   8)HTN--continue to hold Coreg , Lasix  and lisinopril  due to soft BP  9)DM2-A1c 6.0 reflecting excellent diabetic control PTA -PTA Victoza , Farxiga  and metformin  are on hold -Hypoglycemic episode as above #3 due to poor oral intake -IV dextrose  as ordered  Status is: Inpatient   Disposition: The patient is from: Home              Anticipated d/c is to: Home              Anticipated d/c date is: 2 days              Patient currently is not medically stable to d/c. Barriers: Not Clinically Stable-   Code Status :  -  Code Status: Full Code   Family Communication:   Son at bedside, patient's daughter Olam is on speaker phone  DVT Prophylaxis  :   - SCDs  enoxaparin  (LOVENOX ) injection 40 mg Start: 12/26/23 1000  Lab Results  Component Value Date   PLT 167 12/27/2023   Inpatient Medications Scheduled Meds:  acidophilus  2 capsule Oral TID   aspirin  EC  81 mg Oral Daily   Chlorhexidine  Gluconate Cloth  6 each Topical Q0600   cycloSPORINE   1 drop Both Eyes BID   docusate sodium   100 mg Oral BID   enoxaparin  (LOVENOX ) injection  40 mg Subcutaneous Q24H   levofloxacin   500 mg Oral  Daily   levothyroxine   50 mcg Oral Q0600   lidocaine   1 patch Transdermal Q24H   pantoprazole   40 mg Oral Daily   pregabalin   150 mg Oral BID   venlafaxine  XR  150 mg Oral Q breakfast   Continuous Infusions:  sodium chloride      PRN Meds:.acetaminophen  **OR** acetaminophen , albuterol , melatonin, ondansetron  **OR** ondansetron  (ZOFRAN ) IV, polyethylene glycol,  SUMAtriptan    Anti-infectives (From admission, onward)    Start     Dose/Rate Route Frequency Ordered Stop   12/29/23 1000  levofloxacin  (LEVAQUIN ) tablet 500 mg        500 mg Oral Daily 12/29/23 0750     12/27/23 0600  cefTRIAXone  (ROCEPHIN ) 1 g in sodium chloride  0.9 % 100 mL IVPB  Status:  Discontinued        1 g 200 mL/hr over 30 Minutes Intravenous Every 24 hours 12/26/23 0738 12/29/23 0750   12/26/23 0245  cefTRIAXone  (ROCEPHIN ) 1 g in sodium chloride  0.9 % 100 mL IVPB        1 g 200 mL/hr over 30 Minutes Intravenous  Once 12/26/23 0238 12/26/23 0336       Objective: Vitals:   12/28/23 1120 12/28/23 1609 12/28/23 2115 12/29/23 0509  BP: 123/84 125/83 118/83 112/71  Pulse: 85 82 77 74  Resp: (!) 24 (!) 22 (!) 24 16  Temp:   98.5 F (36.9 C) 98.5 F (36.9 C)  TempSrc:   Axillary   SpO2: 97% 92% 95% 96%  Weight:      Height:        Intake/Output Summary (Last 24 hours) at 12/29/2023 1122 Last data filed at 12/29/2023 0900 Gross per 24 hour  Intake 590 ml  Output 700 ml  Net -110 ml   Filed Weights   12/25/23 2216 12/26/23 1218  Weight: 60 kg 62.9 kg       General:  AAO x 3,  cooperative, no distress;   HEENT:  Normocephalic, PERRL, otherwise with in Normal limits   Neuro:  CNII-XII intact. , normal motor and sensation, reflexes intact   Lungs:   Audibly wheezing, poor air exchange mid to lower lobe, tachycardic  Respirations unlabored,  Mild rhonchi no wheezes /some lower lobe crackles  Cardio:    S1/S2, RRR, No murmure, No Rubs or Gallops   Abdomen:  Soft, non-tender, bowel sounds active all four quadrants, no guarding or peritoneal signs.  Muscular  skeletal:  Limited exam -global generalized weaknesses - in bed, able to move all 4 extremities,   2+ pulses,  symmetric, No pitting edema  Skin:  Dry, warm to touch, negative for any Rashes,  Wounds: Please see nursing documentation  Pressure Injury 12/26/23 Buttocks Right;Medial;Mid Stage 2 -  Partial  thickness loss of dermis presenting as a shallow open injury with a red, pink wound bed without slough. pink/red (Active)  12/26/23 1230  Location: Buttocks  Location Orientation: Right;Medial;Mid  Staging: Stage 2 -  Partial thickness loss of dermis presenting as a shallow open injury with a red, pink wound bed without slough.  Wound Description (Comments): pink/red  Present on Admission: Yes  Dressing Type Foam - Lift dressing to assess site every shift 12/28/23 2112     Pressure Injury 12/26/23 Buttocks Left;Medial;Mid Stage 2 -  Partial thickness loss of dermis presenting as a shallow open injury with a red, pink wound bed without slough. pink/red (Active)  12/26/23 1230  Location: Buttocks  Location Orientation: Left;Medial;Mid  Staging: Stage 2 -  Partial thickness loss of dermis presenting as a shallow open injury with a red, pink wound bed without slough.  Wound Description (Comments): pink/red  Present on Admission: Yes  Dressing Type Foam - Lift dressing to assess site every shift 12/28/23 2112         Data Reviewed: I have personally reviewed following labs and imaging studies  CBC: Recent Labs  Lab 12/25/23 2318 12/27/23 0513 12/28/23 0434  WBC 9.0 10.5  --   NEUTROABS 7.1  --   --   HGB 14.9 11.6* 11.6*  HCT 44.3 35.9* 35.8*  MCV 89.5 90.0  --   PLT 164 167  --    Basic Metabolic Panel: Recent Labs  Lab 12/25/23 2318 12/27/23 0513 12/28/23 0434 12/29/23 0424  NA 134* 136 138 138  K 3.9 3.4* 3.5 3.6  CL 103 105 109 109  CO2 19* 22 22 23   GLUCOSE 91 87 142* 149*  BUN 12 8 5* 7*  CREATININE 0.80 0.65 0.52 0.56  CALCIUM  8.5* 7.9* 8.1* 8.1*   GFR: Estimated Creatinine Clearance: 50.8 mL/min (by C-G formula based on SCr of 0.56 mg/dL). Liver Function Tests: Recent Labs  Lab 12/25/23 2318 12/27/23 0513  AST 51* 45*  ALT 26 27  ALKPHOS 32* 32*  BILITOT 1.5* 1.2  PROT 6.6 5.7*  ALBUMIN 3.2* 2.7*   Cardiac Enzymes: Recent Labs  Lab  12/25/23 2318 12/27/23 0513 12/29/23 0424  CKTOTAL 2,153* 955* 218   HbA1C: No results for input(s): HGBA1C in the last 72 hours.  Recent Results (from the past 240 hours)  Blood Culture (routine x 2)     Status: None (Preliminary result)   Collection Time: 12/25/23 11:18 PM   Specimen: BLOOD  Result Value Ref Range Status   Specimen Description BLOOD BLOOD RIGHT ARM  Final   Special Requests   Final    BOTTLES DRAWN AEROBIC ONLY Blood Culture adequate volume   Culture   Final    NO GROWTH 4 DAYS Performed at [facility], [address] [place], [place] [address]    Report Status PENDING  Incomplete  Blood Culture (routine x 2)     Status: None (Preliminary result)   Collection Time: 12/25/23 11:30 PM   Specimen: BLOOD  Result Value Ref Range Status   Specimen Description BLOOD BLOOD RIGHT HAND  Final   Special Requests   Final    BOTTLES DRAWN AEROBIC ONLY Blood Culture results may not be optimal due to an inadequate volume of blood received in culture bottles   Culture   Final    NO GROWTH 4 DAYS Performed at [facility], [address], [place], [place] [address]    Report Status PENDING  Incomplete  Urine Culture (for pregnant, neutropenic or urologic patients or patients with an indwelling urinary catheter)     Status: Abnormal   Collection Time: 12/26/23  2:20 AM   Specimen: Urine, Clean Catch  Result Value Ref Range Status   Specimen Description   Final    URINE, CLEAN CATCH Performed at [facility], [address], [place], [place] [address]    Special Requests   Final    Normal Performed at [facility], [address], [place], [place] [address]    Culture >=100,000 COLONIES/mL ESCHERICHIA COLI (A)  Final   Report Status 12/28/2023 FINAL  Final   Organism ID, Bacteria ESCHERICHIA COLI (A)  Final      Susceptibility   Escherichia coli - MIC*  AMPICILLIN <=2 SENSITIVE Sensitive     CEFAZOLIN  <=4 SENSITIVE Sensitive     CEFEPIME  <=0.12 SENSITIVE  Sensitive     CEFTRIAXONE  <=0.25 SENSITIVE Sensitive     CIPROFLOXACIN  >=4 RESISTANT Resistant     GENTAMICIN <=1 SENSITIVE Sensitive     IMIPENEM <=0.25 SENSITIVE Sensitive     NITROFURANTOIN  <=16 SENSITIVE Sensitive     TRIMETH/SULFA <=20 SENSITIVE Sensitive     AMPICILLIN/SULBACTAM <=2 SENSITIVE Sensitive     PIP/TAZO <=4 SENSITIVE Sensitive ug/mL    * >=100,000 COLONIES/mL ESCHERICHIA COLI  MRSA Next Gen by PCR, Nasal     Status: None   Collection Time: 12/26/23 10:37 AM   Specimen: Nasal Mucosa; Nasal Swab  Result Value Ref Range Status   MRSA by PCR Next Gen NOT DETECTED NOT DETECTED Final    Comment: (NOTE) The GeneXpert MRSA Assay (FDA approved for NASAL specimens only), is one component of a comprehensive MRSA colonization surveillance program. It is not intended to diagnose MRSA infection nor to guide or monitor treatment for MRSA infections. Test performance is not FDA approved in patients less than 41 years old. Performed at [facility], [address], [place], [place] [address]     Radiology Studies: No results found.  Scheduled Meds:  acidophilus  2 capsule Oral TID   aspirin  EC  81 mg Oral Daily   Chlorhexidine  Gluconate Cloth  6 each Topical Q0600   cycloSPORINE   1 drop Both Eyes BID   docusate sodium   100 mg Oral BID   enoxaparin  (LOVENOX ) injection  40 mg Subcutaneous Q24H   levofloxacin   500 mg Oral Daily   levothyroxine   50 mcg Oral Q0600   lidocaine   1 patch Transdermal Q24H   pantoprazole   40 mg Oral Daily   pregabalin   150 mg Oral BID   venlafaxine  XR  150 mg Oral Q breakfast   Continuous Infusions:  sodium chloride       LOS: 3 days   Adriana DELENA Grams M.D on 12/29/2023 at 11:22 AM  Go to [email or web address] - for contact info  [facility] - Office  [phone]  If 7PM-7AM, please contact night-coverage [email or web address] 12/29/2023, 11:22 AM

## 2023-12-29 NOTE — Plan of Care (Signed)
   Problem: Coping: Goal: Ability to adjust to condition or change in health will improve Outcome: Progressing   Problem: Fluid Volume: Goal: Ability to maintain a balanced intake and output will improve Outcome: Progressing   Problem: Metabolic: Goal: Ability to maintain appropriate glucose levels will improve Outcome: Progressing

## 2023-12-29 NOTE — Evaluation (Signed)
 Physical Therapy Evaluation Patient Details Name: [name] White MRN: [id number] DOB: 1941/03/25 Today's Date: 12/29/2023  History of Present Illness  : [name] White is a 83 y.o. female with medical history significant of hypertension, hyperlipidemia, osteoporosis, migraines, anxiety, asthma, depression, hypothyroidism, pituitary adenoma s/p resection, type 2 diabetes mellitus, neuropathy, chronic venous insufficiency. She presented to [name] yesterday night after being found on the floor by family.  Patient lives independently, she fell at home and was unable to get up from the floor. Family was unable to get hold of her, so a family member checked on her in the evening and was found on the floor confused. Family believes she fell sometime between Saturday evening and Sunday evening when she was found. This estimation is based upon her medicine container that she refills every Sunday morning being empty indicating to family that she was able to take her Saturday evening medications but was not able to refill the medicine container for the week Sunday morning. LKW was Saturday evening. History is obtained entirely from son at bedside and daughter [name] via phone secondary to patient confusion.    Clinical Impression  Patient sitting up in chair assisted there by nursing on therapist arrival.  She is agreeable to therapist assessment.  Daughter and son at bedside.  Patient needs min A to scoot fully to the edge of the chair and mod assist for sit to stand to the RW.  Initially has posterior lean but corrects with cues.  Patient ambulates with RW and min A to navigate walker with cues to stay in the walker frame and to stand with improved posture.  Slow decreased gait speed.  PT assists patient back to bed.  She needs cues for technique and hand placement and min to mod assist for her legs and to reposition in bed.  . Pt demonstrating significant functional limitations in mobility, transfers,  ambulation and ADLs due to muscle weakness, deconditioning, anxiety and pain. Based upon these deficits/impairments, patient will benefit from continued skilled physical therapy services during remainder of hospital stay and at the next recommended venue of care to address deficits and promote return to optimal function.   patient left in bed with call button in reach, bed alarm set and family at bedside and nursing notified of mobility status.           If plan is discharge home, recommend the following: A little help with walking and/or transfers;A little help with bathing/dressing/bathroom;Help with stairs or ramp for entrance;Assistance with cooking/housework   Can travel by private vehicle        Equipment Recommendations None recommended by PT  Recommendations for Other Services       Functional Status Assessment Patient has had a recent decline in their functional status and demonstrates the ability to make significant improvements in function in a reasonable and predictable amount of time.     Precautions / Restrictions Precautions Precautions: Fall Restrictions Weight Bearing Restrictions Per Provider Order: No      Mobility  Bed Mobility Overal bed mobility: Needs Assistance Bed Mobility: Sit to Supine       Sit to supine: Min assist     Patient Response: Cooperative  Transfers Overall transfer level: Needs assistance Equipment used: Rolling walker (2 wheels) Transfers: Sit to/from Stand Sit to Stand: Mod assist           General transfer comment: needs A for set up and moderate assist for initial boost up to standing  Ambulation/Gait Ambulation/Gait assistance: Min assist Gait Distance (Feet): 20 Feet Assistive device: Rolling walker (2 wheels) Gait Pattern/deviations: Knee flexed in stance - left, Knee flexed in stance - right, Trunk flexed Gait velocity: decreased; needs cues to hold her head up and shoulders up        Stairs             Wheelchair Mobility     Tilt Bed Tilt Bed Patient Response: Cooperative  Modified Rankin (Stroke Patients Only)       Balance Overall balance assessment: Needs assistance Sitting-balance support: Feet supported, Bilateral upper extremity supported Sitting balance-Leahy Scale: Fair Sitting balance - Comments: fair sitting balance sitting on edge of chair   Standing balance support: Bilateral upper extremity supported, Reliant on assistive device for balance, During functional activity Standing balance-Leahy Scale: Fair Standing balance comment: fair standing balance in RW with min A from PT for balance                             Pertinent Vitals/Pain Pain Assessment Pain Assessment: 0-10 Pain Score: 0-No pain    Home Living Family/patient expects to be discharged to:: Private residence Living Arrangements: Alone Available Help at Discharge: Family;Available 24 hours/day Type of Home: House Home Access: Stairs to enter Entrance Stairs-Rails: Left Entrance Stairs-Number of Steps: 2   Home Layout: One level Home Equipment: Agricultural Consultant (2 wheels);Rollator (4 wheels);Cane - quad;Cane - single point;Hand held shower head;Shower seat - built in;Grab bars - tub/shower      Prior Function Prior Level of Function : Independent/Modified Independent             Mobility Comments: states she walks in home without AD; uses cane outside of home       Extremity/Trunk Assessment   Upper Extremity Assessment Upper Extremity Assessment: Generalized weakness    Lower Extremity Assessment Lower Extremity Assessment: Generalized weakness    Cervical / Trunk Assessment Cervical / Trunk Assessment: Kyphotic  Communication   Communication Communication: No apparent difficulties  Cognition Arousal: Alert Behavior During Therapy: WFL for tasks assessed/performed Overall Cognitive Status: Within Functional Limits for tasks assessed                                           General Comments      Exercises     Assessment/Plan    PT Assessment Patient needs continued PT services  PT Problem List Decreased strength;Decreased activity tolerance;Decreased balance       PT Treatment Interventions Gait training;Balance training;Functional mobility training;Therapeutic activities;Therapeutic exercise;Patient/family education    PT Goals (Current goals can be found in the Care Plan section)  Acute Rehab PT Goals Patient Stated Goal: return home PT Goal Formulation: With patient/family Time For Goal Achievement: 01/12/24 Potential to Achieve Goals: Good    Frequency Min 2X/week     Co-evaluation               AM-PAC PT 6 Clicks Mobility  Outcome Measure Help needed turning from your back to your side while in a flat bed without using bedrails?: A Little Help needed moving from lying on your back to sitting on the side of a flat bed without using bedrails?: A Little Help needed moving to and from a bed to a chair (including a wheelchair)?: A Little Help needed standing up  from a chair using your arms (e.g., wheelchair or bedside chair)?: A Little Help needed to walk in hospital room?: A Little Help needed climbing 3-5 steps with a railing? : A Lot 6 Click Score: 17    End of Session   Activity Tolerance: Patient tolerated treatment well;Patient limited by fatigue Patient left: in bed;with family/visitor present;with call bell/phone within reach;with bed alarm set Nurse Communication: Mobility status PT Visit Diagnosis: Muscle weakness (generalized) (M62.81);Other abnormalities of gait and mobility (R26.89)    Time: 8694-8664 PT Time Calculation (min) (ACUTE ONLY): 30 min   Charges:   PT Evaluation $PT Eval Low Complexity: 1 Low   PT General Charges $$ ACUTE PT VISIT: 1 Visit         2:15 PM, 12/29/23 Nicholis Stepanek Small Shiza Thelen MPT [facility] [place] [phone] Ph:[phone]

## 2023-12-29 NOTE — Care Management Important Message (Signed)
 Important Message  Patient Details  Name: [name] MRN: [id number] Date of Birth: 1941/12/15   Important Message Given:  Yes - Medicare IM     Corey Harold 12/29/2023, 3:30 PM

## 2023-12-29 NOTE — Plan of Care (Signed)
  Problem: Education: Goal: Ability to describe self-care measures that may prevent or decrease complications (Diabetes Survival Skills Education) will improve Outcome: Not Progressing   Problem: Skin Integrity: Goal: Risk for impaired skin integrity will decrease Outcome: Not Progressing   Problem: Fluid Volume: Goal: Hemodynamic stability will improve Outcome: Progressing

## 2023-12-30 DIAGNOSIS — R6521 Severe sepsis with septic shock: Secondary | ICD-10-CM | POA: Diagnosis not present

## 2023-12-30 DIAGNOSIS — A419 Sepsis, unspecified organism: Secondary | ICD-10-CM | POA: Diagnosis not present

## 2023-12-30 LAB — CULTURE, BLOOD (ROUTINE X 2)
Culture: NO GROWTH
Culture: NO GROWTH
Special Requests: ADEQUATE

## 2023-12-30 LAB — GLUCOSE, CAPILLARY
Glucose-Capillary: 108 mg/dL — ABNORMAL HIGH (ref 70–99)
Glucose-Capillary: 132 mg/dL — ABNORMAL HIGH (ref 70–99)
Glucose-Capillary: 94 mg/dL (ref 70–99)
Glucose-Capillary: 95 mg/dL (ref 70–99)

## 2023-12-30 NOTE — TOC Initial Note (Signed)
 Transition of Care [facility]) - Initial/Assessment Note    Patient Details  Name: [name] White MRN: [id number] Date of Birth: 04/13/1941  [facility]) CM/SW Contact:    Lorraine LILLETTE Fenton, LCSW Phone Number: 12/30/2023, 11:30 AM  Clinical Narrative:                 CSW visited pt at bedside, daughter was visiting.  Pt accepts HHPT recommendation, no preferred agency. T and daughter also requested a HH Aide to assist during the week.  Daughter also asked pt aloud if she would like a referral script to pursue the [facility] system for overnight dryness.  CSW agreed to make referrals and request scripts from MD.   CSW referred pt to [facility] who accepted, TOC to follow.   Expected Discharge Plan: Home w Home Health Services Barriers to Discharge: Continued Medical Work up   Patient Goals and CMS Choice Patient states their goals for this hospitalization and ongoing recovery are:: Return home, with [facility]   Choice offered to / list presented to : Patient, Adult Children      Expected Discharge Plan and Services In-house Referral: Clinical Social Work   Post Acute Care Choice: Home Health Living arrangements for the past 2 months: Single Family Home                           HH Arranged: PT, Nurse's Aide HH Agency: [facility] Date [facility] Agency Contacted: 12/30/23 Time HH Agency Contacted: [phone] Representative spoke with at [facility] Agency: [place], [place]  Prior Living Arrangements/Services Living arrangements for the past 2 months: Single Family Home Lives with:: Self   Do you feel safe going back to the place where you live?: Yes      Need for Family Participation in Patient Care: Yes (Comment) Care giver support system in place?: Yes (comment) Current home services: DME Criminal Activity/Legal Involvement Pertinent to Current Situation/Hospitalization: No - Comment as needed  Activities of Daily Living   ADL Screening (condition at time of  admission) Independently performs ADLs?: Yes (appropriate for developmental age) Is the patient deaf or have difficulty hearing?: No Does the patient have difficulty seeing, even when wearing glasses/contacts?: No Does the patient have difficulty concentrating, remembering, or making decisions?: No  Permission Sought/Granted Permission sought to share information with : Family Supports                Emotional Assessment Appearance:: Appears stated age Attitude/Demeanor/Rapport: Gracious Affect (typically observed): Accepting Orientation: : Oriented to Self, Oriented to Situation Alcohol / Substance Use: Not Applicable    Admission diagnosis:  Delirium [R41.0] UTI (urinary tract infection) [N39.0] Acute cystitis with hematuria [N30.01] Sepsis due to urinary tract infection (HCC) [A41.9, N39.0] Traumatic rhabdomyolysis, initial encounter (HCC) [T79.6XXA] Altered mental status, unspecified altered mental status type [R41.82] Patient Active Problem List   Diagnosis Date Noted   Rhabdomyolysis 12/27/2023   Pressure injury of skin 12/27/2023   UTI (urinary tract infection) 12/26/2023   Sepsis due to urinary tract infection (HCC) 12/26/2023   Acute metabolic encephalopathy 12/26/2023   Severe Sepsis with Septic shock (HCC) 12/26/2023   Status post transsphenoidal pituitary resection (HCC) 06/04/2020   Pituitary adenoma with extrasellar extension (HCC) 06/04/2020   Pituitary tumor 05/29/2020   Dizzinesses 02/02/2020   Acute pain of right shoulder 02/02/2020   Encounter for screening mammogram for malignant neoplasm of breast 01/28/2020   Vitamin D  deficiency 01/28/2020  Paroxysmal supraventricular tachycardia (HCC) 06/19/2014   Rectocele 03/19/2013   Constipation 03/19/2013   HYPERCHOLESTEROLEMIA 02/09/2009   Essential hypertension 02/09/2009   Osteoporosis 02/09/2009   Post-menopausal 02/09/2009   HEAT INTOLERANCE 02/01/2008   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    Hypothyroidism 09/14/2007   Diabetes (HCC) 09/14/2007   Migraine headache 09/14/2007   SUPERFICIAL PHLEBITIS 09/14/2007   PANCREATITIS 09/14/2007   MENOPAUSAL SYNDROME 09/14/2007   FATIGUE 09/14/2007   PCP:  [name], MD Pharmacy:   [facility] - [place], [place] - [address] [place] [address] Phone: [phone] Fax: [phone]  [facility] - [place], [place] - [address] [place] [address] Phone: [phone] Fax: [phone]     Social Drivers of Health (SDOH) Social History: SDOH Screenings   Food Insecurity: Patient Unable To Answer (12/29/2023)  Housing: Patient Unable To Answer (12/29/2023)  Transportation Needs: Patient Unable To Answer (12/29/2023)  Utilities: Patient Unable To Answer (12/26/2023)  Depression (PHQ2-9): Low Risk  (01/28/2020)  Social Connections: Patient Unable To Answer (12/26/2023)  Tobacco Use: Low Risk  (12/25/2023)   SDOH Interventions:     Readmission Risk Interventions    12/27/2023    1:21 PM  Readmission Risk Prevention Plan  Transportation Screening Complete  Home Care Screening Complete  Medication Review (RN CM) Complete

## 2023-12-30 NOTE — Progress Notes (Signed)
 PROGRESS NOTE   [name] White, is a 83 y.o. female, DOB - 19-Sep-1941, FMW:[id number]  Admit date - 12/25/2023   Admitting Physician Courage Pearlean, MD  Outpatient Primary MD for the patient is [name], MD  LOS - 4  Chief Complaint  Patient presents with   Fall       Brief Narrative:  83 y.o. female with medical history significant of hypertension, hyperlipidemia, osteoporosis, migraines, anxiety, asthma, depression, hypothyroidism, pituitary adenoma s/p resection, type 2 diabetes mellitus, neuropathy, chronic venous insufficiency admitted with severe sepsis with septic shock due to E. coli UTI on 12/26/2023 -Patient currently having acute metabolic encephalopathy and psychosis with behavioral disturbance--requiring as needed Haldol     Subjective:  [name] White was seen and examined this morning, laying in bed comfortably, complaining significant generalized weaknesses.  Nurse of breath with minimal exertion, ambulation Needing assist with all her ADLs   -Assessment and Plan: 1)Severe sepsis with septic shock due to E. coli UTI----POA -Hemodynamically stable, much improved -Septic shock and physiology resolved -Weaned off of IV Levophed  -Discontinuing IV fluids-oral intake improving -MRSA PCR negative --lactic acidosis resolved with IV fluids -BP stable this morning -Urine culture from 12/26/2023 with mostly pansensitive E. Coli -Blood cultures from 12/25/2023 NGTD -Was on IV Rocephin  due to sensitivity, will change to p.o. antibiotics of cefadroxil     2) status post fall and acute metabolic encephalopathy--due to #1 above -Mentation back to baseline -CT head without acute findings -Urine tox screen negative -Still having significant physical weakness, ambulating with assist  3) episodes of hypoglycemia--- due to lethargy and poor oral intake -Currently requiring IV dextrose  infusion until -oral intake is improved,   4)Rhabdomyolysis--- patient had a fall at home  prior to admission CK 2,153 >>955 >>> 218, 186  Continuing IV fluids -Improving with hydration---monitoring CPK and creatinine -Stopped atorvastatin   5) Acute anemia--- hemoglobin is down to 11.6 from 14.9 on admission- -patient's baseline hemoglobin usually around 12 -Suspect that patient was hemoconcentrated on admission -Now developing anemia due to hemodilution from aggressive sepsis protocol IV fluids -Hgb stable -No evidence of ongoing bleeding  6)Hypokalemia--replace and recheck  7)Hypothyroidism--- continue levothyroxine   8)HTN--continue to hold Coreg , Lasix  and lisinopril  due to soft BP  9)DM2-A1c 6.0 reflecting excellent diabetic control PTA -PTA Victoza , Farxiga  and metformin  are on hold -Hypoglycemic episode as above #3 due to poor oral intake -IV dextrose  as ordered  Status is: Inpatient   Disposition: The patient is from: Home              Anticipated d/c is to: Home              Anticipated d/c date is: 2 days Home with home health              Patient currently is not medically stable to d/c. Barriers: Not Clinically Stable-   Code Status :  -  Code Status: Full Code   Family Communication:   Son at bedside, patient's daughter Olam is on speaker phone  DVT Prophylaxis  :   - SCDs  enoxaparin  (LOVENOX ) injection 40 mg Start: 12/26/23 1000  Lab Results  Component Value Date   PLT 167 12/27/2023   Inpatient Medications Scheduled Meds:  acidophilus  2 capsule Oral TID   aspirin  EC  81 mg Oral Daily   cefadroxil   500 mg Oral BID   cycloSPORINE   1 drop Both Eyes BID   docusate sodium   100 mg Oral BID   enoxaparin  (LOVENOX )  injection  40 mg Subcutaneous Q24H   levothyroxine   50 mcg Oral Q0600   lidocaine   1 patch Transdermal Q24H   pantoprazole   40 mg Oral Daily   pregabalin   150 mg Oral BID   venlafaxine  XR  150 mg Oral Q breakfast   Continuous Infusions:   PRN Meds:.acetaminophen  **OR** acetaminophen , albuterol , melatonin, ondansetron  **OR**  ondansetron  (ZOFRAN ) IV, mouth rinse, polyethylene glycol, SUMAtriptan    Anti-infectives (From admission, onward)    Start     Dose/Rate Route Frequency Ordered Stop   12/30/23 1000  cefadroxil  (DURICEF) capsule 500 mg        500 mg Oral 2 times daily 12/29/23 1229     12/29/23 1000  levofloxacin  (LEVAQUIN ) tablet 500 mg  Status:  Discontinued        500 mg Oral Daily 12/29/23 0750 12/29/23 1229   12/27/23 0600  cefTRIAXone  (ROCEPHIN ) 1 g in sodium chloride  0.9 % 100 mL IVPB  Status:  Discontinued        1 g 200 mL/hr over 30 Minutes Intravenous Every 24 hours 12/26/23 0738 12/29/23 0750   12/26/23 0245  cefTRIAXone  (ROCEPHIN ) 1 g in sodium chloride  0.9 % 100 mL IVPB        1 g 200 mL/hr over 30 Minutes Intravenous  Once 12/26/23 0238 12/26/23 0336       Objective: Vitals:   12/29/23 0509 12/29/23 1350 12/29/23 1953 12/30/23 0534  BP: 112/71 108/80 106/67 101/74  Pulse: 74 85 88 76  Resp: 16 17 20 20   Temp: 98.5 F (36.9 C) 97.7 F (36.5 C) 97.7 F (36.5 C) 98 F (36.7 C)  TempSrc:  Oral Oral   SpO2: 96% 97% 97% 95%  Weight:      Height:        Intake/Output Summary (Last 24 hours) at 12/30/2023 1234 Last data filed at 12/30/2023 0830 Gross per 24 hour  Intake 720 ml  Output 750 ml  Net -30 ml   Filed Weights   12/25/23 2216 12/26/23 1218  Weight: 60 kg 62.9 kg         General:  AAO x 3,  cooperative, improved shortness of breath  HEENT:  Normocephalic, PERRL, otherwise with in Normal limits   Neuro:  CNII-XII intact. , normal motor and sensation, reflexes intact   Lungs:   Clear to auscultation BL, Respirations unlabored,  Improving wheezes / crackles  Cardio:    S1/S2, RRR, No murmure, No Rubs or Gallops   Abdomen:  Soft, non-tender, bowel sounds active all four quadrants, no guarding or peritoneal signs.  Muscular  skeletal:  Limited exam -sever global generalized weaknesses - in bed, able to move all 4 extremities,   2+ pulses,  symmetric, No pitting  edema  Skin:  Dry, warm to touch, negative for any Rashes,  Wounds: Please see nursing documentation  Pressure Injury 12/26/23 Buttocks Right;Medial;Mid Stage 2 -  Partial thickness loss of dermis presenting as a shallow open injury with a red, pink wound bed without slough. pink/red (Active)  12/26/23 1230  Location: Buttocks  Location Orientation: Right;Medial;Mid  Staging: Stage 2 -  Partial thickness loss of dermis presenting as a shallow open injury with a red, pink wound bed without slough.  Wound Description (Comments): pink/red  Present on Admission: Yes  Dressing Type Foam - Lift dressing to assess site every shift 12/30/23 0830     Pressure Injury 12/26/23 Buttocks Left;Medial;Mid Stage 2 -  Partial thickness loss of dermis presenting as a  shallow open injury with a red, pink wound bed without slough. pink/red (Active)  12/26/23 1230  Location: Buttocks  Location Orientation: Left;Medial;Mid  Staging: Stage 2 -  Partial thickness loss of dermis presenting as a shallow open injury with a red, pink wound bed without slough.  Wound Description (Comments): pink/red  Present on Admission: Yes  Dressing Type None 12/29/23 2030           Data Reviewed: I have personally reviewed following labs and imaging studies  CBC: Recent Labs  Lab 12/25/23 2318 12/27/23 0513 12/28/23 0434  WBC 9.0 10.5  --   NEUTROABS 7.1  --   --   HGB 14.9 11.6* 11.6*  HCT 44.3 35.9* 35.8*  MCV 89.5 90.0  --   PLT 164 167  --    Basic Metabolic Panel: Recent Labs  Lab 12/25/23 2318 12/27/23 0513 12/28/23 0434 12/29/23 0424  NA 134* 136 138 138  K 3.9 3.4* 3.5 3.6  CL 103 105 109 109  CO2 19* 22 22 23   GLUCOSE 91 87 142* 149*  BUN 12 8 5* 7*  CREATININE 0.80 0.65 0.52 0.56  CALCIUM  8.5* 7.9* 8.1* 8.1*   GFR: Estimated Creatinine Clearance: 50.8 mL/min (by C-G formula based on SCr of 0.56 mg/dL). Liver Function Tests: Recent Labs  Lab 12/25/23 2318 12/27/23 0513  AST 51* 45*   ALT 26 27  ALKPHOS 32* 32*  BILITOT 1.5* 1.2  PROT 6.6 5.7*  ALBUMIN 3.2* 2.7*   Cardiac Enzymes: Recent Labs  Lab 12/25/23 2318 12/27/23 0513 12/29/23 0424 12/29/23 0808  CKTOTAL 2,153* 955* 218 186  CKMB  --   --   --  5.5*   HbA1C: No results for input(s): HGBA1C in the last 72 hours.  Recent Results (from the past 240 hours)  Blood Culture (routine x 2)     Status: None   Collection Time: 12/25/23 11:18 PM   Specimen: BLOOD  Result Value Ref Range Status   Specimen Description BLOOD BLOOD RIGHT ARM  Final   Special Requests   Final    BOTTLES DRAWN AEROBIC ONLY Blood Culture adequate volume   Culture   Final    NO GROWTH 5 DAYS Performed at [facility], [address] [place], [place] [address]    Report Status 12/30/2023 FINAL  Final  Blood Culture (routine x 2)     Status: None   Collection Time: 12/25/23 11:30 PM   Specimen: BLOOD  Result Value Ref Range Status   Specimen Description BLOOD BLOOD RIGHT HAND  Final   Special Requests   Final    BOTTLES DRAWN AEROBIC ONLY Blood Culture results may not be optimal due to an inadequate volume of blood received in culture bottles   Culture   Final    NO GROWTH 5 DAYS Performed at [facility], [address] [place], [place] [address]    Report Status 12/30/2023 FINAL  Final  Urine Culture (for pregnant, neutropenic or urologic patients or patients with an indwelling urinary catheter)     Status: Abnormal   Collection Time: 12/26/23  2:20 AM   Specimen: Urine, Clean Catch  Result Value Ref Range Status   Specimen Description   Final    URINE, CLEAN CATCH Performed at [facility], [address] [place], [place] [address]    Special Requests   Final    Normal Performed at [facility], [address], [place], [place] [address]    Culture >=100,000 COLONIES/mL ESCHERICHIA  COLI (A)  Final   Report Status 12/28/2023 FINAL  Final   Organism ID, Bacteria ESCHERICHIA COLI (A)  Final      Susceptibility    Escherichia coli - MIC*    AMPICILLIN <=2 SENSITIVE Sensitive     CEFAZOLIN  <=4 SENSITIVE Sensitive     CEFEPIME  <=0.12 SENSITIVE Sensitive     CEFTRIAXONE  <=0.25 SENSITIVE Sensitive     CIPROFLOXACIN  >=4 RESISTANT Resistant     GENTAMICIN <=1 SENSITIVE Sensitive     IMIPENEM <=0.25 SENSITIVE Sensitive     NITROFURANTOIN  <=16 SENSITIVE Sensitive     TRIMETH/SULFA <=20 SENSITIVE Sensitive     AMPICILLIN/SULBACTAM <=2 SENSITIVE Sensitive     PIP/TAZO <=4 SENSITIVE Sensitive ug/mL    * >=100,000 COLONIES/mL ESCHERICHIA COLI  MRSA Next Gen by PCR, Nasal     Status: None   Collection Time: 12/26/23 10:37 AM   Specimen: Nasal Mucosa; Nasal Swab  Result Value Ref Range Status   MRSA by PCR Next Gen NOT DETECTED NOT DETECTED Final    Comment: (NOTE) The GeneXpert MRSA Assay (FDA approved for NASAL specimens only), is one component of a comprehensive MRSA colonization surveillance program. It is not intended to diagnose MRSA infection nor to guide or monitor treatment for MRSA infections. Test performance is not FDA approved in patients less than 15 years old. Performed at [facility], [address] [place], [place] [address]     Radiology Studies: No results found.  Scheduled Meds:  acidophilus  2 capsule Oral TID   aspirin  EC  81 mg Oral Daily   cefadroxil   500 mg Oral BID   cycloSPORINE   1 drop Both Eyes BID   docusate sodium   100 mg Oral BID   enoxaparin  (LOVENOX ) injection  40 mg Subcutaneous Q24H   levothyroxine   50 mcg Oral Q0600   lidocaine   1 patch Transdermal Q24H   pantoprazole   40 mg Oral Daily   pregabalin   150 mg Oral BID   venlafaxine  XR  150 mg Oral Q breakfast   Continuous Infusions:    LOS: 4 days   Adriana DELENA Grams M.D on 12/30/2023 at 12:34 PM  Go to [email or web address] - for contact info  [facility] - Office  [phone]  If 7PM-7AM, please contact night-coverage [email or web address] 12/30/2023, 12:34 PM

## 2023-12-31 DIAGNOSIS — R6521 Severe sepsis with septic shock: Secondary | ICD-10-CM | POA: Diagnosis not present

## 2023-12-31 DIAGNOSIS — A419 Sepsis, unspecified organism: Secondary | ICD-10-CM | POA: Diagnosis not present

## 2023-12-31 LAB — GLUCOSE, CAPILLARY
Glucose-Capillary: 146 mg/dL — ABNORMAL HIGH (ref 70–99)
Glucose-Capillary: 94 mg/dL (ref 70–99)
Glucose-Capillary: 142 mg/dL — ABNORMAL HIGH (ref 70–99)

## 2023-12-31 MED ORDER — RISAQUAD PO CAPS: 2.0000 | ORAL_CAPSULE | Freq: Three times a day (TID) | ORAL | 0 refills | Status: AC

## 2023-12-31 MED ORDER — INSULIN ASPART 100 UNIT/ML IJ SOLN: 0.0000 [IU] | Freq: Three times a day (TID) | INTRAMUSCULAR | Status: DC

## 2023-12-31 MED ORDER — CEFADROXIL 500 MG PO CAPS: 500.0000 mg | ORAL_CAPSULE | Freq: Two times a day (BID) | ORAL | 0 refills | Status: AC

## 2023-12-31 NOTE — Progress Notes (Signed)
 PROGRESS NOTE   [name] White, is a 83 y.o. female, DOB - [birth year], FMW:[id number]  Admit date - 12/25/2023   Admitting Physician Courage Pearlean, MD  Outpatient Primary MD for the patient is [name] Norleen PEDLAR, MD  LOS - 5  Chief Complaint  Patient presents with   Fall       Brief Narrative:  83 y.o. female with medical history significant of hypertension, hyperlipidemia, osteoporosis, migraines, anxiety, asthma, depression, hypothyroidism, pituitary adenoma s/p resection, type 2 diabetes mellitus, neuropathy, chronic venous insufficiency admitted with severe sepsis with septic shock due to E. coli UTI on 12/26/2023 -Patient currently having acute metabolic encephalopathy and psychosis with behavioral disturbance--requiring as needed Haldol     Subjective:  [name] White seen and examined, still complaining of generalized weaknesses needing assist with all her ADLs   -Assessment and Plan: 1)Severe sepsis with septic shock due to E. coli UTI----POA -Resolved Medically stable now -Septic shock and physiology resolved  -Weaned off of IV Levophed  -Discontinuing IV fluids-oral intake improving -MRSA PCR negative --lactic acidosis resolved with IV fluids -BP stable this morning -Urine culture from 12/26/2023 with mostly pansensitive E. Coli -Blood cultures from 12/25/2023 NGTD -Was on IV Rocephin  due to sensitivity, will change to p.o. antibiotics of cefadroxil     2) status post fall and acute metabolic encephalopathy--due to #1 above -Resolved mentation back to baseline -CT head without acute findings -Urine tox screen negative -Still having significant physical weakness, ambulating with assist  3) episodes of hypoglycemia---  -Much improved -Improved oral intake   4)Rhabdomyolysis--- patient had a fall at home prior to admission CK 2,153 >>955 >>> 218, 186  Continuing IV fluids -Improving with hydration---monitoring CPK and creatinine -Stopped atorvastatin   5) Acute  anemia--- hemoglobin is down to 11.6 from 14.9 on admission- -patient's baseline hemoglobin usually around 12 -Suspect that patient was hemoconcentrated on admission -Now developing anemia due to hemodilution from aggressive sepsis protocol IV fluids -Hgb stable -No evidence of ongoing bleeding  6)Hypokalemia--replace and recheck  7)Hypothyroidism--- continue levothyroxine   8)HTN--continue to hold Coreg , Lasix  and lisinopril  due to soft BP  9)DM2-A1c 6.0 reflecting excellent diabetic control PTA -PTA Victoza , Farxiga  and metformin  are on hold -Hypoglycemic episode as above #3 due to poor oral intake -Solved with improved oral intake  Status is: Inpatient   Disposition: The patient is from: Home              Anticipated d/c is to: Home              Anticipated d/c date is: 2 days Home with home health              Patient currently is not medically stable to d/c. Barriers: Not Clinically Stable-   Code Status :  -  Code Status: Full Code   Family Communication:   Son at bedside, patient's daughter [name] White is on speaker phone  DVT Prophylaxis  :   - SCDs  enoxaparin  (LOVENOX ) injection 40 mg Start: 12/26/23 1000  Lab Results  Component Value Date   PLT 167 12/27/2023   Inpatient Medications Scheduled Meds:  acidophilus  2 capsule Oral TID   aspirin  EC  81 mg Oral Daily   cefadroxil   500 mg Oral BID   cycloSPORINE   1 drop Both Eyes BID   docusate sodium   100 mg Oral BID   enoxaparin  (LOVENOX ) injection  40 mg Subcutaneous Q24H   insulin  aspart  0-6 Units Subcutaneous TID WC   levothyroxine   50 mcg  Oral Q0600   lidocaine   1 patch Transdermal Q24H   pantoprazole   40 mg Oral Daily   pregabalin   150 mg Oral BID   venlafaxine  XR  150 mg Oral Q breakfast   Continuous Infusions:   PRN Meds:.acetaminophen  **OR** acetaminophen , albuterol , melatonin, ondansetron  **OR** ondansetron  (ZOFRAN ) IV, mouth rinse, polyethylene glycol, SUMAtriptan    Anti-infectives (From admission,  onward)    Start     Dose/Rate Route Frequency Ordered Stop   12/31/23 0000  cefadroxil  (DURICEF) 500 MG capsule        500 mg Oral 2 times daily 12/31/23 0810 01/05/24 2359   12/30/23 1000  cefadroxil  (DURICEF) capsule 500 mg        500 mg Oral 2 times daily 12/29/23 1229     12/29/23 1000  levofloxacin  (LEVAQUIN ) tablet 500 mg  Status:  Discontinued        500 mg Oral Daily 12/29/23 0750 12/29/23 1229   12/27/23 0600  cefTRIAXone  (ROCEPHIN ) 1 g in sodium chloride  0.9 % 100 mL IVPB  Status:  Discontinued        1 g 200 mL/hr over 30 Minutes Intravenous Every 24 hours 12/26/23 0738 12/29/23 0750   12/26/23 0245  cefTRIAXone  (ROCEPHIN ) 1 g in sodium chloride  0.9 % 100 mL IVPB        1 g 200 mL/hr over 30 Minutes Intravenous  Once 12/26/23 0238 12/26/23 0336       Objective: Vitals:   12/30/23 1446 12/30/23 2038 12/31/23 0637 12/31/23 1356  BP: 111/61 124/74 108/60 109/63  Pulse: 91 77 69 79  Resp: 19 17 18 18   Temp: 97.6 F (36.4 C) (!) 97.5 F (36.4 C) 98.2 F (36.8 C) 98 F (36.7 C)  TempSrc: Oral Oral Oral Oral  SpO2: 94% 96% 93% 97%  Weight:      Height:        Intake/Output Summary (Last 24 hours) at 12/31/2023 1531 Last data filed at 12/31/2023 1300 Gross per 24 hour  Intake 480 ml  Output --  Net 480 ml   Filed Weights   12/25/23 2216 12/26/23 1218  Weight: 60 kg 62.9 kg          General:  AAO x 3,  cooperative, no distress;   HEENT:  Normocephalic, PERRL, otherwise with in Normal limits   Neuro:  CNII-XII intact. , normal motor and sensation, reflexes intact   Lungs:   Clear to auscultation BL, Respirations unlabored,  No wheezes / crackles  Cardio:    S1/S2, RRR, No murmure, No Rubs or Gallops   Abdomen:  Soft, non-tender, bowel sounds active all four quadrants, no guarding or peritoneal signs.  Muscular  skeletal:  Limited exam -global generalized weaknesses - in bed, able to move all 4 extremities,   2+ pulses,  symmetric, No pitting edema  Skin:   Dry, warm to touch, negative for any Rashes,  Wounds: Please see nursing documentation  Pressure Injury 12/26/23 Buttocks Right;Medial;Mid Stage 2 -  Partial thickness loss of dermis presenting as a shallow open injury with a red, pink wound bed without slough. pink/red (Active)  12/26/23 1230  Location: Buttocks  Location Orientation: Right;Medial;Mid  Staging: Stage 2 -  Partial thickness loss of dermis presenting as a shallow open injury with a red, pink wound bed without slough.  Wound Description (Comments): pink/red  Present on Admission: Yes  Dressing Type Foam - Lift dressing to assess site every shift 12/31/23 0930     Pressure Injury 12/26/23  Buttocks Left;Medial;Mid Stage 2 -  Partial thickness loss of dermis presenting as a shallow open injury with a red, pink wound bed without slough. pink/red (Active)  12/26/23 1230  Location: Buttocks  Location Orientation: Left;Medial;Mid  Staging: Stage 2 -  Partial thickness loss of dermis presenting as a shallow open injury with a red, pink wound bed without slough.  Wound Description (Comments): pink/red  Present on Admission: Yes  Dressing Type None 12/31/23 0930              Data Reviewed: I have personally reviewed following labs and imaging studies  CBC: Recent Labs  Lab 12/25/23 2318 12/27/23 0513 12/28/23 0434  WBC 9.0 10.5  --   NEUTROABS 7.1  --   --   HGB 14.9 11.6* 11.6*  HCT 44.3 35.9* 35.8*  MCV 89.5 90.0  --   PLT 164 167  --    Basic Metabolic Panel: Recent Labs  Lab 12/25/23 2318 12/27/23 0513 12/28/23 0434 12/29/23 0424  NA 134* 136 138 138  K 3.9 3.4* 3.5 3.6  CL 103 105 109 109  CO2 19* 22 22 23   GLUCOSE 91 87 142* 149*  BUN 12 8 5* 7*  CREATININE 0.80 0.65 0.52 0.56  CALCIUM  8.5* 7.9* 8.1* 8.1*   GFR: Estimated Creatinine Clearance: 50.8 mL/min (by C-G formula based on SCr of 0.56 mg/dL). Liver Function Tests: Recent Labs  Lab 12/25/23 2318 12/27/23 0513  AST 51* 45*  ALT 26 27   ALKPHOS 32* 32*  BILITOT 1.5* 1.2  PROT 6.6 5.7*  ALBUMIN 3.2* 2.7*   Cardiac Enzymes: Recent Labs  Lab 12/25/23 2318 12/27/23 0513 12/29/23 0424 12/29/23 0808  CKTOTAL 2,153* 955* 218 186  CKMB  --   --   --  5.5*   HbA1C: No results for input(s): HGBA1C in the last 72 hours.  Recent Results (from the past 240 hours)  Blood Culture (routine x 2)     Status: None   Collection Time: 12/25/23 11:18 PM   Specimen: BLOOD  Result Value Ref Range Status   Specimen Description BLOOD BLOOD RIGHT ARM  Final   Special Requests   Final    BOTTLES DRAWN AEROBIC ONLY Blood Culture adequate volume   Culture   Final    NO GROWTH 5 DAYS Performed at [facility], [address] [place] [place], [place] [address]    Report Status 12/30/2023 FINAL  Final  Blood Culture (routine x 2)     Status: None   Collection Time: 12/25/23 11:30 PM   Specimen: BLOOD  Result Value Ref Range Status   Specimen Description BLOOD BLOOD RIGHT HAND  Final   Special Requests   Final    BOTTLES DRAWN AEROBIC ONLY Blood Culture results may not be optimal due to an inadequate volume of blood received in culture bottles   Culture   Final    NO GROWTH 5 DAYS Performed at [facility], [address], [place], [place] [address]    Report Status 12/30/2023 FINAL  Final  Urine Culture (for pregnant, neutropenic or urologic patients or patients with an indwelling urinary catheter)     Status: Abnormal   Collection Time: 12/26/23  2:20 AM   Specimen: Urine, Clean Catch  Result Value Ref Range Status   Specimen Description   Final    URINE, CLEAN CATCH Performed at [facility], [address] [place], [place] [address]    Special Requests   Final    Normal Performed  at [facility], [address], [place], [place] [address]    Culture >=100,000 COLONIES/mL ESCHERICHIA COLI (A)  Final   Report Status 12/28/2023 FINAL  Final   Organism ID, Bacteria ESCHERICHIA COLI (A)  Final      Susceptibility   Escherichia  coli - MIC*    AMPICILLIN <=2 SENSITIVE Sensitive     CEFAZOLIN  <=4 SENSITIVE Sensitive     CEFEPIME  <=0.12 SENSITIVE Sensitive     CEFTRIAXONE  <=0.25 SENSITIVE Sensitive     CIPROFLOXACIN  >=4 RESISTANT Resistant     GENTAMICIN <=1 SENSITIVE Sensitive     IMIPENEM <=0.25 SENSITIVE Sensitive     NITROFURANTOIN  <=16 SENSITIVE Sensitive     TRIMETH/SULFA <=20 SENSITIVE Sensitive     AMPICILLIN/SULBACTAM <=2 SENSITIVE Sensitive     PIP/TAZO <=4 SENSITIVE Sensitive ug/mL    * >=100,000 COLONIES/mL ESCHERICHIA COLI  MRSA Next Gen by PCR, Nasal     Status: None   Collection Time: 12/26/23 10:37 AM   Specimen: Nasal Mucosa; Nasal Swab  Result Value Ref Range Status   MRSA by PCR Next Gen NOT DETECTED NOT DETECTED Final    Comment: (NOTE) The GeneXpert MRSA Assay (FDA approved for NASAL specimens only), is one component of a comprehensive MRSA colonization surveillance program. It is not intended to diagnose MRSA infection nor to guide or monitor treatment for MRSA infections. Test performance is not FDA approved in patients less than 63 years old. Performed at [facility], [address], [place], [place] [address]     Radiology Studies: No results found.  Scheduled Meds:  acidophilus  2 capsule Oral TID   aspirin  EC  81 mg Oral Daily   cefadroxil   500 mg Oral BID   cycloSPORINE   1 drop Both Eyes BID   docusate sodium   100 mg Oral BID   enoxaparin  (LOVENOX ) injection  40 mg Subcutaneous Q24H   insulin  aspart  0-6 Units Subcutaneous TID WC   levothyroxine   50 mcg Oral Q0600   lidocaine   1 patch Transdermal Q24H   pantoprazole   40 mg Oral Daily   pregabalin   150 mg Oral BID   venlafaxine  XR  150 mg Oral Q breakfast   Continuous Infusions:    LOS: 5 days   Adriana DELENA Grams M.D on 12/31/2023 at 3:31 PM  Go to [email or web address] - for contact info  [facility] - Office  [phone]  If 7PM-7AM, please contact night-coverage [email or web address] 12/31/2023, 3:31 PM

## 2023-12-31 NOTE — [place] FL2 (Signed)
 [place]  MEDICAID FL2 LEVEL OF CARE FORM     IDENTIFICATION  Patient Name: [name] White Birthdate: 1941-07-08 Sex: female Admission Date (Current Location): 12/25/2023  [facility] and [place] Number:  [employer] and Address:  [facility],  [address], [place] [address]      Provider Number: [id number]  Attending Physician Name and Address:  Willette Adriana LABOR, MD  Relative Name and Phone Number:       Current Level of Care: Hospital Recommended Level of Care: Skilled Nursing Facility Prior Approval Number:    Date Approved/Denied:   PASRR Number: [id number]  Discharge Plan: SNF    Current Diagnoses: Patient Active Problem List   Diagnosis Date Noted   Rhabdomyolysis 12/27/2023   Pressure injury of skin 12/27/2023   UTI (urinary tract infection) 12/26/2023   Sepsis due to urinary tract infection (HCC) 12/26/2023   Acute metabolic encephalopathy 12/26/2023   Severe Sepsis with Septic shock (HCC) 12/26/2023   Status post transsphenoidal pituitary resection (HCC) 06/04/2020   Pituitary adenoma with extrasellar extension (HCC) 06/04/2020   Pituitary tumor 05/29/2020   Dizzinesses 02/02/2020   Acute pain of right shoulder 02/02/2020   Encounter for screening mammogram for malignant neoplasm of breast 01/28/2020   Vitamin D  deficiency 01/28/2020   Paroxysmal supraventricular tachycardia (HCC) 06/19/2014   Rectocele 03/19/2013   Constipation 03/19/2013   HYPERCHOLESTEROLEMIA 02/09/2009   Essential hypertension 02/09/2009   Osteoporosis 02/09/2009   Post-menopausal 02/09/2009   HEAT INTOLERANCE 02/01/2008   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   Hypothyroidism 09/14/2007   Diabetes (HCC) 09/14/2007   Migraine headache 09/14/2007   SUPERFICIAL PHLEBITIS 09/14/2007   PANCREATITIS 09/14/2007   MENOPAUSAL SYNDROME 09/14/2007   FATIGUE 09/14/2007    Orientation RESPIRATION BLADDER Height & Weight     Self, Situation, Time, Place  Normal  Continent Weight: 138 lb 10.7 oz (62.9 kg) Height:  5' 6 (167.6 cm)  BEHAVIORAL SYMPTOMS/MOOD NEUROLOGICAL BOWEL NUTRITION STATUS      Continent Diet (see dc summary)  AMBULATORY STATUS COMMUNICATION OF NEEDS Skin   Extensive Assist Verbally Normal                       Personal Care Assistance Level of Assistance  Bathing, Feeding, Dressing Bathing Assistance: Limited assistance Feeding assistance: Independent Dressing Assistance: Limited assistance     Functional Limitations Info  Sight, Hearing, Speech Sight Info: Adequate Hearing Info: Adequate Speech Info: Adequate    SPECIAL CARE FACTORS FREQUENCY  PT (By licensed PT), OT (By licensed OT)     PT Frequency: 5x week OT Frequency: 5x week            Contractures Contractures Info: Not present    Additional Factors Info  Code Status, Allergies Code Status Info: Full Allergies Info: Betadine, Penicillins           Current Medications (12/31/2023):  This is the current hospital active medication list Current Facility-Administered Medications  Medication Dose Route Frequency Provider Last Rate Last Admin   acetaminophen  (TYLENOL ) tablet 650 mg  650 mg Oral Q6H PRN Foust, Katy L, NP   650 mg at 12/27/23 2310   Or   acetaminophen  (TYLENOL ) suppository 650 mg  650 mg Rectal Q6H PRN Foust, Katy L, NP       acidophilus (RISAQUAD) capsule 2 capsule  2 capsule Oral TID Shahmehdi, Seyed A, MD   2 capsule at 12/30/23 2204   albuterol  (PROVENTIL ) (2.5 MG/3ML) 0.083% nebulizer solution 2.5  mg  2.5 mg Nebulization Q4H PRN Foust, Katy L, NP   2.5 mg at 12/27/23 0539   aspirin  EC tablet 81 mg  81 mg Oral Daily Foust, Katy L, NP   81 mg at 12/31/23 0929   cefadroxil  (DURICEF) capsule 500 mg  500 mg Oral BID Shahmehdi, Seyed A, MD   500 mg at 12/31/23 0945   cycloSPORINE  (RESTASIS ) 0.05 % ophthalmic emulsion 1 drop  1 drop Both Eyes BID Foust, Katy L, NP   1 drop at 12/31/23 0930   docusate sodium  (COLACE) capsule 100 mg  100  mg Oral BID Foust, Katy L, NP   100 mg at 12/31/23 9070   enoxaparin  (LOVENOX ) injection 40 mg  40 mg Subcutaneous Q24H Foust, Katy L, NP   40 mg at 12/31/23 9070   insulin  aspart (novoLOG ) injection 0-6 Units  0-6 Units Subcutaneous TID WC Shahmehdi, Seyed A, MD       levothyroxine  (SYNTHROID ) tablet 50 mcg  50 mcg Oral Q0600 Pearlean, Courage, MD   50 mcg at 12/31/23 0500   lidocaine  (LIDODERM ) 5 % 1 patch  1 patch Transdermal Q24H Foust, Katy L, NP   1 patch at 12/31/23 1142   melatonin tablet 6 mg  6 mg Oral QHS PRN Segars, Jonathan, MD   6 mg at 12/29/23 2036   ondansetron  (ZOFRAN ) tablet 4 mg  4 mg Oral Q6H PRN Foust, Katy L, NP       Or   ondansetron  (ZOFRAN ) injection 4 mg  4 mg Intravenous Q6H PRN Foust, Katy L, NP       Oral care mouth rinse  15 mL Mouth Rinse PRN Shahmehdi, Seyed A, MD       pantoprazole  (PROTONIX ) EC tablet 40 mg  40 mg Oral Daily Foust, Katy L, NP   40 mg at 12/31/23 0929   polyethylene glycol (MIRALAX  / GLYCOLAX ) packet 17 g  17 g Oral Daily PRN Foust, Katy L, NP       pregabalin  (LYRICA ) capsule 150 mg  150 mg Oral BID Emokpae, Courage, MD   150 mg at 12/31/23 9070   SUMAtriptan  (IMITREX ) tablet 100 mg  100 mg Oral PRN Foust, Katy L, NP       venlafaxine  XR (EFFEXOR -XR) 24 hr capsule 150 mg  150 mg Oral Q breakfast Foust, Katy L, NP   150 mg at 12/31/23 9070     Discharge Medications: Please see discharge summary for a list of discharge medications.  Relevant Imaging Results:  Relevant Lab Results:   Additional Information SSN: [id number], [place]

## 2023-12-31 NOTE — TOC Progression Note (Signed)
 Transition of Care [facility]) - Progression Note    Patient Details  Name: [name] White MRN: [id number] Date of Birth: [birth year]  [facility] CM/SW Contact  Rollo Petri, LCSW Phone Number: 12/31/2023, 1:39 PM  Clinical Narrative:     TOC following. Met with Pt and family at bedside to review dc planning at family request. Pt and family now requesting SNF rehab at dc. CMS provider options reviewed. Will refer as requested and start insurance authorization.  Updated MD who states pt can dc tomorrow if SNF and auth obtained.  Will follow.  Expected Discharge Plan: Skilled Nursing Facility Barriers to Discharge: SNF Pending bed offer, Insurance Authorization  Expected Discharge Plan and Services In-house Referral: Clinical Social Work   Post Acute Care Choice: Skilled Nursing Facility Living arrangements for the past 2 months: Single Family Home                           HH Arranged: PT, Nurse's Aide HH Agency: [facility] Date [facility] Agency Contacted: 12/30/23 Time HH Agency Contacted: [phone] Representative spoke with at [facility] Agency: [place], [place]   Social Determinants of Health (SDOH) Interventions SDOH Screenings   Food Insecurity: Patient Unable To Answer (12/29/2023)  Housing: Patient Unable To Answer (12/29/2023)  Transportation Needs: Patient Unable To Answer (12/29/2023)  Utilities: Patient Unable To Answer (12/26/2023)  Depression (PHQ2-9): Low Risk  (01/28/2020)  Social Connections: Patient Unable To Answer (12/26/2023)  Tobacco Use: Low Risk  (12/25/2023)    Readmission Risk Interventions    12/27/2023    1:21 PM  Readmission Risk Prevention Plan  Transportation Screening Complete  Home Care Screening Complete  Medication Review (RN CM) Complete

## 2024-01-01 DIAGNOSIS — A419 Sepsis, unspecified organism: Secondary | ICD-10-CM | POA: Diagnosis not present

## 2024-01-01 DIAGNOSIS — R6521 Severe sepsis with septic shock: Secondary | ICD-10-CM | POA: Diagnosis not present

## 2024-01-01 LAB — GLUCOSE, CAPILLARY
Glucose-Capillary: 89 mg/dL (ref 70–99)
Glucose-Capillary: 108 mg/dL — ABNORMAL HIGH (ref 70–99)
Glucose-Capillary: 110 mg/dL — ABNORMAL HIGH (ref 70–99)
Glucose-Capillary: 129 mg/dL — ABNORMAL HIGH (ref 70–99)

## 2024-01-01 MED ORDER — ACETAMINOPHEN 325 MG PO TABS: 650.0000 mg | ORAL_TABLET | Freq: Four times a day (QID) | ORAL | 0 refills | Status: AC | PRN

## 2024-01-01 MED ORDER — METFORMIN HCL 500 MG PO TABS: 500.0000 mg | ORAL_TABLET | Freq: Two times a day (BID) | ORAL | 0 refills | Status: DC

## 2024-01-01 NOTE — Discharge Summary (Addendum)
 Physician Discharge Summary   Patient: [name] White MRN: [id number] DOB: 10-Apr-1941  Admit date:     12/25/2023  Discharge date: 01/02/24  Discharge Physician: Adriana DELENA Grams   PCP: [name], MD    Patient was seen and examined, stable to be discharged. No further changes to this discharge summary    Recommendations at discharge:   Follow-up with PCP in 1 week Significant modification of current medication due to low blood pressure, low blood sugars and rhabdomyolysis (statins has been discontinued, BP medications been modified, patient is to continue only on metformin -continue strict blood sugar checks) Continue aggressive PT OT, fall precautions  Discharge Diagnoses: Principal Problem:   Severe Sepsis with Septic shock (HCC) Active Problems:   Sepsis due to urinary tract infection (HCC)   Acute metabolic encephalopathy   UTI (urinary tract infection)   Rhabdomyolysis   Hypothyroidism   Diabetes (HCC)   Essential hypertension   Pressure injury of skin  Resolved Problems:   * No resolved hospital problems. *  [name] White is a 83 y.o. female with medical history significant of hypertension, hyperlipidemia, osteoporosis, migraines, anxiety, asthma, depression, hypothyroidism, pituitary adenoma s/p resection, type 2 diabetes mellitus, neuropathy, chronic venous insufficiency admitted with severe sepsis with septic shock due to E. coli UTI on 12/26/2023 -Patient currently having acute metabolic encephalopathy and psychosis with behavioral disturbance--requiring as needed Haldol       1)Severe sepsis with septic shock due to E. coli UTI----POA -Resolved Medically stable now -Septic shock and physiology resolved   -Weaned off of IV Levophed  -Discontinuing IV fluids-oral intake improving -MRSA PCR negative --lactic acidosis resolved with IV fluids -BP stable this morning -Urine culture from 12/26/2023 with mostly pansensitive E. Coli -Blood cultures from  12/25/2023 NGTD -Was on IV Rocephin  due to sensitivity, will change to p.o. antibiotics of cefadroxil     2) status post fall and acute metabolic encephalopathy--due to #1 above -Resolved mentation back to baseline -CT head without acute findings -Urine tox screen negative -Still having significant physical weakness, ambulating with assist   3) Episodes of hypoglycemia---  -Much improved -Improved oral intake     4)Rhabdomyolysis--- patient had a fall at home prior to admission CK 2,153 >>955 >>> 218, 186  Continuing IV fluids -Improving with hydration---monitoring CPK and creatinine -Stopped atorvastatin    5) Acute anemia--- hemoglobin is down to 11.6 from 14.9 on admission- -patient's baseline hemoglobin usually around 12 -Suspect that patient was hemoconcentrated on admission -Now developing anemia due to hemodilution from aggressive sepsis protocol IV fluids -Hgb stable -No evidence of ongoing bleeding   6)Hypokalemia--replace and recheck   7)Hypothyroidism--- continue levothyroxine    8)HTN--hypotensive, lisinopril  discontinued As blood pressures improved Coreg  and Lasix  were restarted    9)DM2-A1c 6.0 reflecting excellent diabetic control PTA -PTA Victoza , Farxiga  and metformin  were on hold -Hypoglycemic episode as above #3 due to poor oral intake -Solved with improved oral intake restarted metformin         Disposition: Nursing home Diet recommendation:  Discharge Diet Orders (From admission, onward)     Start     Ordered   01/01/24 0000  Diet - low sodium heart healthy        01/01/24 1101           Carb modified diet DISCHARGE MEDICATION: Allergies as of 01/02/2024       Reactions   Betadine [povidone Iodine] Hives   Penicillins Rash        Medication List  STOP taking these medications    atorvastatin  40 MG tablet Commonly known as: LIPITOR   baclofen  10 MG tablet Commonly known as: LIORESAL    escitalopram 20 MG tablet Commonly  known as: LEXAPRO   fluticasone  50 MCG/ACT nasal spray Commonly known as: FLONASE    HYDROcodone -acetaminophen  5-325 MG tablet Commonly known as: NORCO/VICODIN   lisinopril  10 MG tablet Commonly known as: ZESTRIL    metFORMIN  500 MG 24 hr tablet Commonly known as: GLUCOPHAGE -XR Replaced by: metFORMIN  500 MG tablet   methocarbamol  500 MG tablet Commonly known as: ROBAXIN    repaglinide  1 MG tablet Commonly known as: PRANDIN    traMADol  50 MG tablet Commonly known as: ULTRAM    Trintellix  10 MG Tabs tablet Generic drug: vortioxetine  HBr   Victoza  18 MG/3ML Sopn Generic drug: liraglutide        TAKE these medications    Accu-Chek Aviva Plus test strip Generic drug: glucose blood TEST TWICE DAILY.   Accu-Chek Guide Me w/Device Kit   Accu-Chek Softclix Lancets lancets SMARTSIG:Topical   acetaminophen  325 MG tablet Commonly known as: TYLENOL  Take 2 tablets (650 mg total) by mouth every 6 (six) hours as needed for mild pain (pain score 1-3) (or Fever >/= 101).   acidophilus Caps capsule Take 2 capsules by mouth 3 (three) times daily for 5 days.   albuterol  108 (90 Base) MCG/ACT inhaler Commonly known as: VENTOLIN  HFA Inhale 2 puffs into the lungs every 4 (four) hours as needed for wheezing or shortness of breath.   ALPRAZolam  0.25 MG tablet Commonly known as: XANAX  Take 1 tablet (0.25 mg total) by mouth daily as needed.   aspirin  81 MG tablet Take 81 mg by mouth daily.   Breo Ellipta  200-25 MCG/INH Aepb Generic drug: fluticasone  furoate-vilanterol Inhale 1 puff into the lungs daily.   carvedilol  3.125 MG tablet Commonly known as: COREG  TAKE ONE TABLET BY MOUTH TWICE A DAY   cefadroxil  500 MG capsule Commonly known as: DURICEF Take 1 capsule (500 mg total) by mouth 2 (two) times daily for 5 days.   cycloSPORINE  0.05 % ophthalmic emulsion Commonly known as: RESTASIS  Place 1 drop into both eyes 2 (two) times daily.   docusate sodium  100 MG  capsule Commonly known as: COLACE Take 1 capsule (100 mg total) by mouth 2 (two) times daily.   estradiol  1 MG tablet Commonly known as: ESTRACE  TAKE ONE TABLET (1MG  TOTAL) BY MOUTH DAILY What changed: See the new instructions.   Farxiga  5 MG Tabs tablet Generic drug: dapagliflozin  propanediol Take 2.5 mg by mouth daily before breakfast. What changed: how much to take   furosemide  20 MG tablet Commonly known as: LASIX  May take Lasix  20 mg as NEEDED for leg swelling   Gemtesa 75 MG Tabs Generic drug: Vibegron Take 75 mg by mouth at bedtime.   levothyroxine  75 MCG tablet Commonly known as: SYNTHROID  Take 50 mcg by mouth daily.   metFORMIN  500 MG tablet Commonly known as: GLUCOPHAGE  Take 1 tablet (500 mg total) by mouth 2 (two) times daily with a meal. Replaces: metFORMIN  500 MG 24 hr tablet   multivitamins ther. w/minerals Tabs tablet Take 1 tablet by mouth every morning.   omeprazole  20 MG capsule Commonly known as: PRILOSEC Take 20 mg by mouth every morning.   pregabalin  150 MG capsule Commonly known as: LYRICA  TAKE ONE CAPSULE (150 MG TOTAL) BY MOUTHTWO TIMES DAILY.   rizatriptan  10 MG disintegrating tablet Commonly known as: MAXALT -MLT Take 10 mg by mouth as needed for migraine. May repeat  in 2 hours if needed   Unifine Pentips 31G X 6 MM Misc Generic drug: Insulin  Pen Needle USE TO INJECT VICTOZA  ONCE DAILY   venlafaxine  XR 150 MG 24 hr capsule Commonly known as: EFFEXOR -XR Take 150 mg by mouth every morning.               Discharge Care Instructions  (From admission, onward)           Start     Ordered   01/02/24 0000  Discharge wound care:       Comments: Per RN instructions   01/02/24 1032   01/01/24 0000  Discharge wound care:       Comments: Pressure Injury 12/26/23 Buttocks Left;Medial;Mid Stage 2 -  Partial thickness loss of dermis presenting as a shallow open injury with a red, pink wound bed without slough. pink/red 5 days    Pressure Injury 12/26/23 Buttocks Right;Medial;Mid Stage 2 -  Partial thickness loss of dermis presenting as a shallow open injury with a red, pink wound bed without slough. pink/red   01/01/24 1101            Contact information for follow-up providers     [facility] Follow up.   Specialty: Home Health Services Why: [facility] representative will contact family at DC. Contact information: [address] [place] [address] [phone]              Contact information for after-discharge care     Destination     [facility] .   Service: Skilled Nursing Contact information: [address] [facility] [place]  [address] [phone]                    Discharge Exam: Fredricka Weights   12/25/23 2216 12/26/23 1218  Weight: 60 kg 62.9 kg        General:  AAO x 3,  cooperative, no distress;   HEENT:  Normocephalic, PERRL, otherwise with in Normal limits   Neuro:  CNII-XII intact. , normal motor and sensation, reflexes intact   Lungs:   Clear to auscultation BL, Respirations unlabored,  No wheezes / crackles  Cardio:    S1/S2, RRR, No murmure, No Rubs or Gallops   Abdomen:  Soft, non-tender, bowel sounds active all four quadrants, no guarding or peritoneal signs.  Muscular  skeletal:  Limited exam -global generalized weaknesses - in bed, able to move all 4 extremities,   2+ pulses,  symmetric, No pitting edema  Skin:  Dry, warm to touch, negative for any Rashes,  Wounds: Please see nursing documentation  Pressure Injury 12/26/23 Buttocks Right;Medial;Mid Stage 2 -  Partial thickness loss of dermis presenting as a shallow open injury with a red, pink wound bed without slough. pink/red (Active)  12/26/23 1230  Location: Buttocks  Location Orientation: Right;Medial;Mid  Staging: Stage 2 -  Partial thickness loss of dermis presenting as a shallow open injury with a red, pink wound bed without slough.   Wound Description (Comments): pink/red  Present on Admission: Yes  Dressing Type Foam - Lift dressing to assess site every shift 12/31/23 2141     Pressure Injury 12/26/23 Buttocks Left;Medial;Mid Stage 2 -  Partial thickness loss of dermis presenting as a shallow open injury with a red, pink wound bed without slough. pink/red (Active)  12/26/23 1230  Location: Buttocks  Location Orientation: Left;Medial;Mid  Staging: Stage 2 -  Partial thickness loss of dermis presenting as a  shallow open injury with a red, pink wound bed without slough.  Wound Description (Comments): pink/red  Present on Admission: Yes  Dressing Type Foam - Lift dressing to assess site every shift 12/31/23 2141          Condition at discharge: good  The results of significant diagnostics from this hospitalization (including imaging, microbiology, ancillary and laboratory) are listed below for reference.   Imaging Studies: DG Shoulder Left Port Result Date: 12/26/2023 CLINICAL DATA:  Post fall, now with left shoulder pain. EXAM: LEFT SHOULDER COMPARISON:  03/10/2022; 02/14/2022 FINDINGS: No fracture or dislocation. Mild degenerative change of the glenohumeral and acromioclavicular joints with joint space loss, subchondral sclerosis and osteophytosis. No evidence of calcific tendinitis. Limited visualization of the adjacent thorax is normal given obliquity and technique. Regional soft tissues appear normal. IMPRESSION: 1. No acute findings. 2. Mild degenerative change of the left glenohumeral and acromioclavicular joints. Electronically Signed   By: Norleen Roulette M.D.   On: 12/26/2023 08:24   DG Chest Port 1 View Result Date: 12/26/2023 CLINICAL DATA:  Questionable sepsis - evaluate for abnormality EXAM: PORTABLE CHEST 1 VIEW COMPARISON:  Chest CT performed concurrently. FINDINGS: The cardiomediastinal contours are normal. Aortic atherosclerosis. Pulmonary vasculature is normal. No consolidation, pleural effusion, or  pneumothorax. No acute osseous abnormalities are seen. IMPRESSION: No active disease. Electronically Signed   By: Andrea Gasman M.D.   On: 12/26/2023 01:54   CT CHEST ABDOMEN PELVIS WO CONTRAST Result Date: 12/26/2023 CLINICAL DATA:  Unwitnessed fall. EXAM: CT CHEST, ABDOMEN AND PELVIS WITHOUT CONTRAST TECHNIQUE: Multidetector CT imaging of the chest, abdomen and pelvis was performed following the standard protocol without IV contrast. RADIATION DOSE REDUCTION: This exam was performed according to the departmental dose-optimization program which includes automated exposure control, adjustment of the mA and/or kV according to patient size and/or use of iterative reconstruction technique. COMPARISON:  Noncontrast CT 04/02/2020 FINDINGS: CT CHEST FINDINGS Cardiovascular: Assessment for acute injury is limited in the absence of IV contrast. The heart is normal in size. Mitral annulus calcifications. Atherosclerosis and tortuosity of the thoracic aorta. No pericardial effusion. Mediastinum/Nodes: No mediastinal adenopathy. No mediastinal hemorrhage. Hilar assessment is limited in the absence of IV contrast. Postsurgical change at the gastroesophageal junction. No pneumomediastinum. Lungs/Pleura: No pneumothorax. No focal airspace disease or pulmonary contusion. No pleural effusion. No pulmonary mass. There is mild breathing motion artifact. Musculoskeletal: No acute fracture of the ribs, sternum, included clavicles or shoulder girdles. Degenerative change in the thoracic spine without acute fracture. No confluent chest wall contusion. CT ABDOMEN PELVIS FINDINGS Noncontrast CT imaging in the setting of acute trauma significantly limits the detection of subtle though potentially significant traumatic findings. Hepatobiliary: No focal liver abnormality or evidence of injury. No perihepatic hematoma. Clips in the gallbladder fossa postcholecystectomy. No biliary dilatation. Pancreas: Parenchymal atrophy. No evidence  of injury. No ductal dilatation or inflammation. Chronic calcification of the pancreatic head. Spleen: Homogeneous attenuation.  No perisplenic hematoma. Adrenals/Urinary Tract: No adrenal hemorrhage. Unchanged left adrenal adenoma, needing no further imaging follow-up. No evidence of renal injury or hydronephrosis. Again seen right renal cyst. No further follow-up imaging is recommended. Decompressed urinary bladder, not well assessed Stomach/Bowel: Postsurgical change at the gastroesophageal junction. No evidence of bowel inflammation or injury. No obstruction. Moderate stool in the distal colon. Mild left colonic diverticulosis. No diverticulitis. Vascular/Lymphatic: Mild aortic atherosclerosis. No retroperitoneal fluid. No adenopathy. Reproductive: Status post hysterectomy. No adnexal masses. Other: No ascites or free air. Small fat containing supraumbilical midline ventral  abdominal wall hernia. Chronic soft tissue density in the upper anterior abdominal wall may be scarring or related to medication injection sites. Musculoskeletal: No acute fracture of the pelvis or lumbar spine. IMPRESSION: 1. No acute traumatic injury of the chest, abdomen, or pelvis. 2. Stable chronic findings as described. Aortic Atherosclerosis (ICD10-I70.0). Electronically Signed   By: Andrea Gasman M.D.   On: 12/26/2023 01:53   CT HEAD WO CONTRAST Result Date: 12/26/2023 CLINICAL DATA:  Fall EXAM: CT HEAD WITHOUT CONTRAST CT CERVICAL SPINE WITHOUT CONTRAST TECHNIQUE: Multidetector CT imaging of the head and cervical spine was performed following the standard protocol without intravenous contrast. Multiplanar CT image reconstructions of the cervical spine were also generated. RADIATION DOSE REDUCTION: This exam was performed according to the departmental dose-optimization program which includes automated exposure control, adjustment of the mA and/or kV according to patient size and/or use of iterative reconstruction technique.  COMPARISON:  None Available. FINDINGS: CT HEAD FINDINGS Brain: Unchanged appearance of suprasellar mass. Chronic ischemic white matter changes. Mild generalized volume loss. No mass, hemorrhage or extra-axial collection. Old right cerebellar infarct. Vascular: No hyperdense vessel or unexpected vascular calcification. Skull: The visualized skull base, calvarium and extracranial soft tissues are normal. Sinuses/Orbits: No fluid levels or advanced mucosal thickening of the visualized paranasal sinuses. No mastoid or middle ear effusion. Normal orbits. Other: None. CT CERVICAL SPINE FINDINGS Alignment: No static subluxation. Facets are aligned. Occipital condyles are normally positioned. Skull base and vertebrae: No acute fracture. Soft tissues and spinal canal: No prevertebral fluid or swelling. No visible canal hematoma. Disc levels: No advanced spinal canal or neural foraminal stenosis. Upper chest: No pneumothorax, pulmonary nodule or pleural effusion. Other: Normal visualized paraspinal cervical soft tissues. IMPRESSION: 1. No acute intracranial abnormality. 2. No acute fracture or static subluxation of the cervical spine. 3. Unchanged appearance of suprasellar mass. Electronically Signed   By: Franky Stanford M.D.   On: 12/26/2023 01:11   CT CERVICAL SPINE WO CONTRAST Result Date: 12/26/2023 CLINICAL DATA:  Fall EXAM: CT HEAD WITHOUT CONTRAST CT CERVICAL SPINE WITHOUT CONTRAST TECHNIQUE: Multidetector CT imaging of the head and cervical spine was performed following the standard protocol without intravenous contrast. Multiplanar CT image reconstructions of the cervical spine were also generated. RADIATION DOSE REDUCTION: This exam was performed according to the departmental dose-optimization program which includes automated exposure control, adjustment of the mA and/or kV according to patient size and/or use of iterative reconstruction technique. COMPARISON:  None Available. FINDINGS: CT HEAD FINDINGS Brain:  Unchanged appearance of suprasellar mass. Chronic ischemic white matter changes. Mild generalized volume loss. No mass, hemorrhage or extra-axial collection. Old right cerebellar infarct. Vascular: No hyperdense vessel or unexpected vascular calcification. Skull: The visualized skull base, calvarium and extracranial soft tissues are normal. Sinuses/Orbits: No fluid levels or advanced mucosal thickening of the visualized paranasal sinuses. No mastoid or middle ear effusion. Normal orbits. Other: None. CT CERVICAL SPINE FINDINGS Alignment: No static subluxation. Facets are aligned. Occipital condyles are normally positioned. Skull base and vertebrae: No acute fracture. Soft tissues and spinal canal: No prevertebral fluid or swelling. No visible canal hematoma. Disc levels: No advanced spinal canal or neural foraminal stenosis. Upper chest: No pneumothorax, pulmonary nodule or pleural effusion. Other: Normal visualized paraspinal cervical soft tissues. IMPRESSION: 1. No acute intracranial abnormality. 2. No acute fracture or static subluxation of the cervical spine. 3. Unchanged appearance of suprasellar mass. Electronically Signed   By: Franky Stanford M.D.   On: 12/26/2023 01:11    Microbiology:  Results for orders placed or performed during the hospital encounter of 12/25/23  Blood Culture (routine x 2)     Status: None   Collection Time: 12/25/23 11:18 PM   Specimen: BLOOD  Result Value Ref Range Status   Specimen Description BLOOD BLOOD RIGHT ARM  Final   Special Requests   Final    BOTTLES DRAWN AEROBIC ONLY Blood Culture adequate volume   Culture   Final    NO GROWTH 5 DAYS Performed at [facility], [address], [place], [place] [address]    Report Status 12/30/2023 FINAL  Final  Blood Culture (routine x 2)     Status: None   Collection Time: 12/25/23 11:30 PM   Specimen: BLOOD  Result Value Ref Range Status   Specimen Description BLOOD BLOOD RIGHT HAND  Final   Special Requests   Final     BOTTLES DRAWN AEROBIC ONLY Blood Culture results may not be optimal due to an inadequate volume of blood received in culture bottles   Culture   Final    NO GROWTH 5 DAYS Performed at [facility], [address] [place], [place] [address]    Report Status 12/30/2023 FINAL  Final  Urine Culture (for pregnant, neutropenic or urologic patients or patients with an indwelling urinary catheter)     Status: Abnormal   Collection Time: 12/26/23  2:20 AM   Specimen: Urine, Clean Catch  Result Value Ref Range Status   Specimen Description   Final    URINE, CLEAN CATCH Performed at [facility], [address] [place], [place] [address]    Special Requests   Final    Normal Performed at [facility], [address], [place], [place] [address]    Culture >=100,000 COLONIES/mL ESCHERICHIA COLI (A)  Final   Report Status 12/28/2023 FINAL  Final   Organism ID, Bacteria ESCHERICHIA COLI (A)  Final      Susceptibility   Escherichia coli - MIC*    AMPICILLIN <=2 SENSITIVE Sensitive     CEFAZOLIN  <=4 SENSITIVE Sensitive     CEFEPIME  <=0.12 SENSITIVE Sensitive     CEFTRIAXONE  <=0.25 SENSITIVE Sensitive     CIPROFLOXACIN  >=4 RESISTANT Resistant     GENTAMICIN <=1 SENSITIVE Sensitive     IMIPENEM <=0.25 SENSITIVE Sensitive     NITROFURANTOIN  <=16 SENSITIVE Sensitive     TRIMETH/SULFA <=20 SENSITIVE Sensitive     AMPICILLIN/SULBACTAM <=2 SENSITIVE Sensitive     PIP/TAZO <=4 SENSITIVE Sensitive ug/mL    * >=100,000 COLONIES/mL ESCHERICHIA COLI  MRSA Next Gen by PCR, Nasal     Status: None   Collection Time: 12/26/23 10:37 AM   Specimen: Nasal Mucosa; Nasal Swab  Result Value Ref Range Status   MRSA by PCR Next Gen NOT DETECTED NOT DETECTED Final    Comment: (NOTE) The GeneXpert MRSA Assay (FDA approved for NASAL specimens only), is one component of a comprehensive MRSA colonization surveillance program. It is not intended to diagnose MRSA infection nor to guide or monitor treatment for MRSA  infections. Test performance is not FDA approved in patients less than 70 years old. Performed at [facility], [address], [place], [place] [address]     Labs: CBC: Recent Labs  Lab 12/27/23 0513 12/28/23 0434  WBC 10.5  --   HGB 11.6* 11.6*  HCT 35.9* 35.8*  MCV 90.0  --   PLT 167  --    Basic Metabolic Panel: Recent Labs  Lab 12/27/23 0513 12/28/23 0434 12/29/23 0424  NA 136 138 138  K 3.4* 3.5 3.6  CL 105 109 109  CO2 22 22 23   GLUCOSE 87 142* 149*  BUN 8 5* 7*  CREATININE 0.65 0.52 0.56  CALCIUM  7.9* 8.1* 8.1*   Liver Function Tests: Recent Labs  Lab 12/27/23 0513  AST 45*  ALT 27  ALKPHOS 32*  BILITOT 1.2  PROT 5.7*  ALBUMIN 2.7*   CBG: Recent Labs  Lab 01/01/24 0732 01/01/24 1124 01/01/24 1620 01/01/24 2037 01/02/24 0754  GLUCAP 89 108* 110* 129* 106*    Discharge time spent: greater than 30 minutes.  Signed: Adriana DELENA Grams, MD [facility] 01/02/2024

## 2024-01-01 NOTE — Progress Notes (Signed)
 Mobility Specialist Progress Note:    01/01/24 1452  Mobility  Activity Transferred from chair to bed  Level of Assistance Maximum assist, patient does 25-49%  Assistive Device None  Distance Ambulated (ft) 4 ft  Range of Motion/Exercises Active;All extremities  Activity Response Tolerated well  Mobility Referral Yes  Mobility visit 1 Mobility  Mobility Specialist Start Time (ACUTE ONLY) 1415  Mobility Specialist Stop Time (ACUTE ONLY) 1430  Mobility Specialist Time Calculation (min) (ACUTE ONLY) 15 min   Pt received in chair with NT in room, eager to transfer to bed. Required MaxA to stand and transfer with no AD. Tolerated well, pt fatigued after sitting up. Left pt supine, alarm on. All needs met.   Sherrilee Ditty Mobility Specialist Please contact via Special Educational Needs Teacher or  Rehab office at [phone]

## 2024-01-01 NOTE — Plan of Care (Signed)

## 2024-01-01 NOTE — TOC Progression Note (Signed)
 Transition of Care [facility]) - Progression Note    Patient Details  Name: [name] White MRN: [id number] Date of Birth: Oct 09, 1941  Transition of Care [facility]) CM/SW Contact Phone Number: 01/01/2024, 11:23 AM  Clinical Narrative:   Discussed bed offers with, daughter, Olam. [place] selected and added to INS AUTH. DC summary completed and sent to [facility], [employer] pending, requested new PT note.    Expected Discharge Plan: Skilled Nursing Facility Barriers to Discharge: SNF Pending bed offer, Insurance Authorization  Expected Discharge Plan and Services In-house Referral: Clinical Social Work   Post Acute Care Choice: Skilled Nursing Facility Living arrangements for the past 2 months: Single Family Home Expected Discharge Date: 01/01/24                         HH Arranged: PT, Nurse's Aide HH Agency: [facility] Date [facility] Agency Contacted: 12/30/23 Time HH Agency Contacted: 1129 Representative spoke with at [facility] Agency: Cindie, [facility]   Social Determinants of Health (SDOH) Interventions SDOH Screenings   Food Insecurity: Patient Unable To Answer (12/29/2023)  Housing: Patient Unable To Answer (12/29/2023)  Transportation Needs: Patient Unable To Answer (12/29/2023)  Utilities: Patient Unable To Answer (12/26/2023)  Depression (PHQ2-9): Low Risk  (01/28/2020)  Social Connections: Patient Unable To Answer (12/26/2023)  Tobacco Use: Low Risk  (12/25/2023)    Readmission Risk Interventions    12/27/2023    1:21 PM  Readmission Risk Prevention Plan  Transportation Screening Complete  Home Care Screening Complete  Medication Review (RN CM) Complete

## 2024-01-01 NOTE — Progress Notes (Signed)
 Mobility Specialist Progress Note:    01/01/24 1055  Mobility  Activity Transferred from bed to chair  Level of Assistance Moderate assist, patient does 50-74%  Assistive Device Front wheel walker  Distance Ambulated (ft) 4 ft  Range of Motion/Exercises Active;All extremities  Activity Response Tolerated well  Mobility Referral Yes  Mobility visit 1 Mobility  Mobility Specialist Start Time (ACUTE ONLY) 1035  Mobility Specialist Stop Time (ACUTE ONLY) 1055  Mobility Specialist Time Calculation (min) (ACUTE ONLY) 20 min   Pt received in bed, agreeable to mobility. Required ModA to stand and transfer with RW. Tolerated well, asx throughout. Left pt in chair, alarm on and call bell in reach. All needs met.   Sherrilee Ditty Mobility Specialist Please contact via Special Educational Needs Teacher or  Rehab office at [phone]

## 2024-01-01 NOTE — Care Management Important Message (Signed)
 Important Message  Patient Details  Name: [name] White MRN: [id number] Date of Birth: 1941-10-21   Important Message Given:  Yes - Medicare IM     Corey Harold 01/01/2024, 1:57 PM

## 2024-01-02 DIAGNOSIS — N3281 Overactive bladder: Secondary | ICD-10-CM | POA: Diagnosis not present

## 2024-01-02 DIAGNOSIS — A419 Sepsis, unspecified organism: Secondary | ICD-10-CM | POA: Diagnosis not present

## 2024-01-02 DIAGNOSIS — E893 Postprocedural hypopituitarism: Secondary | ICD-10-CM | POA: Diagnosis not present

## 2024-01-02 DIAGNOSIS — M25511 Pain in right shoulder: Secondary | ICD-10-CM | POA: Diagnosis not present

## 2024-01-02 DIAGNOSIS — G9341 Metabolic encephalopathy: Secondary | ICD-10-CM | POA: Diagnosis not present

## 2024-01-02 DIAGNOSIS — R5381 Other malaise: Secondary | ICD-10-CM | POA: Diagnosis not present

## 2024-01-02 DIAGNOSIS — N390 Urinary tract infection, site not specified: Secondary | ICD-10-CM | POA: Diagnosis not present

## 2024-01-02 DIAGNOSIS — K219 Gastro-esophageal reflux disease without esophagitis: Secondary | ICD-10-CM | POA: Diagnosis not present

## 2024-01-02 DIAGNOSIS — J45909 Unspecified asthma, uncomplicated: Secondary | ICD-10-CM | POA: Diagnosis not present

## 2024-01-02 DIAGNOSIS — I10 Essential (primary) hypertension: Secondary | ICD-10-CM | POA: Diagnosis not present

## 2024-01-02 DIAGNOSIS — R42 Dizziness and giddiness: Secondary | ICD-10-CM | POA: Diagnosis not present

## 2024-01-02 DIAGNOSIS — E039 Hypothyroidism, unspecified: Secondary | ICD-10-CM | POA: Diagnosis not present

## 2024-01-02 DIAGNOSIS — M6282 Rhabdomyolysis: Secondary | ICD-10-CM | POA: Diagnosis not present

## 2024-01-02 DIAGNOSIS — R6521 Severe sepsis with septic shock: Secondary | ICD-10-CM | POA: Diagnosis not present

## 2024-01-02 DIAGNOSIS — M810 Age-related osteoporosis without current pathological fracture: Secondary | ICD-10-CM | POA: Diagnosis not present

## 2024-01-02 DIAGNOSIS — D352 Benign neoplasm of pituitary gland: Secondary | ICD-10-CM | POA: Diagnosis not present

## 2024-01-02 DIAGNOSIS — R262 Difficulty in walking, not elsewhere classified: Secondary | ICD-10-CM | POA: Diagnosis not present

## 2024-01-02 DIAGNOSIS — E119 Type 2 diabetes mellitus without complications: Secondary | ICD-10-CM | POA: Diagnosis not present

## 2024-01-02 DIAGNOSIS — G43909 Migraine, unspecified, not intractable, without status migrainosus: Secondary | ICD-10-CM | POA: Diagnosis not present

## 2024-01-02 DIAGNOSIS — I471 Supraventricular tachycardia, unspecified: Secondary | ICD-10-CM | POA: Diagnosis not present

## 2024-01-02 DIAGNOSIS — E441 Mild protein-calorie malnutrition: Secondary | ICD-10-CM | POA: Diagnosis not present

## 2024-01-02 DIAGNOSIS — M6281 Muscle weakness (generalized): Secondary | ICD-10-CM | POA: Diagnosis not present

## 2024-01-02 LAB — GLUCOSE, CAPILLARY
Glucose-Capillary: 106 mg/dL — ABNORMAL HIGH (ref 70–99)
Glucose-Capillary: 137 mg/dL — ABNORMAL HIGH (ref 70–99)

## 2024-01-02 MED ORDER — ALPRAZOLAM 0.25 MG PO TABS: 0.2500 mg | ORAL_TABLET | Freq: Every day | ORAL | 0 refills | Status: DC | PRN

## 2024-01-02 NOTE — TOC Transition Note (Signed)
 Transition of Care [facility]) - Discharge Note   Patient Details  Name: [name] White MRN: [id number] Date of Birth: June 17, 1941  Transition of Care [facility]) CM/SW Contact:  Sharlyne Stabs, RN Phone Number: 01/02/2024, 10:59 AM   Clinical Narrative:   INS Auth received, Isaiah provided a room number for [facility]. RN will call report, TOC updated her daughter and will call EMS when Rn is ready.    Final next level of care: Skilled Nursing Facility Barriers to Discharge: Barriers Resolved   Patient Goals and CMS Choice Patient states their goals for this hospitalization and ongoing recovery are:: rehab CMS Medicare.gov Compare Post Acute Care list provided to:: Patient Choice offered to / list presented to : Patient, Adult Children [place] ownership interest in [facility].provided to:: Adult Children    Discharge Placement               Patient to be transferred to facility by: EMS Name of family member notified: Olam Patient and family notified of of transfer: 01/02/24  Discharge Plan and Services Additional resources added to the After Visit Summary for   In-house Referral: Clinical Social Work   Post Acute Care Choice: Skilled Nursing Facility                 HH Arranged: PT, Nurse's Aide HH Agency: [facility] Date [facility] Agency Contacted: 12/30/23 Time HH Agency Contacted: [phone] Representative spoke with at [facility] Agency: [place], [facility]  Social Drivers of Health (SDOH) Interventions SDOH Screenings   Food Insecurity: Patient Unable To Answer (12/29/2023)  Housing: Patient Unable To Answer (12/29/2023)  Transportation Needs: Patient Unable To Answer (12/29/2023)  Utilities: Patient Unable To Answer (12/26/2023)  Depression (PHQ2-9): Low Risk  (01/28/2020)  Social Connections: Patient Unable To Answer (12/26/2023)  Tobacco Use: Low Risk  (12/25/2023)    Readmission Risk Interventions    12/27/2023    1:21 PM  Readmission Risk Prevention Plan   Transportation Screening Complete  Home Care Screening Complete  Medication Review (RN CM) Complete

## 2024-01-02 NOTE — Progress Notes (Signed)
 EMS in to transport patient. Pt is dressed and IV is removed x2.

## 2024-01-02 NOTE — Progress Notes (Signed)
 Physician Discharge Summary   Patient: [name] White MRN: [id number] DOB: [birth year]  Admit date:     12/25/2023  Discharge date: 01/02/24  Discharge Physician: Adriana DELENA Grams   PCP: [name], MD   Patient was seen and examined this morning, stable for discharge to SNF     Recommendations at discharge:   Follow-up with PCP in 1 week Significant modification of current medication due to low blood pressure, low blood sugars and rhabdomyolysis (statins has been discontinued, BP medications been modified, patient is to continue only on metformin -continue strict blood sugar checks) Continue aggressive PT OT, fall precautions  Discharge Diagnoses: Principal Problem:   Severe Sepsis with Septic shock (HCC) Active Problems:   Sepsis due to urinary tract infection (HCC)   Acute metabolic encephalopathy   UTI (urinary tract infection)   Rhabdomyolysis   Hypothyroidism   Diabetes (HCC)   Essential hypertension   Pressure injury of skin  Resolved Problems:   * No resolved hospital problems. *  AALAIYAH White is a 83 y.o. female with medical history significant of hypertension, hyperlipidemia, osteoporosis, migraines, anxiety, asthma, depression, hypothyroidism, pituitary adenoma s/p resection, type 2 diabetes mellitus, neuropathy, chronic venous insufficiency admitted with severe sepsis with septic shock due to E. coli UTI on 12/26/2023 -Patient currently having acute metabolic encephalopathy and psychosis with behavioral disturbance--requiring as needed Haldol       1)Severe sepsis with septic shock due to E. coli UTI----POA -Resolved Medically stable now -Septic shock and physiology resolved   -Weaned off of IV Levophed  -Discontinuing IV fluids-oral intake improving -MRSA PCR negative --lactic acidosis resolved with IV fluids -BP stable this morning -Urine culture from 12/26/2023 with mostly pansensitive E. Coli -Blood cultures from 12/25/2023 NGTD -Was on IV Rocephin   due to sensitivity, will change to p.o. antibiotics of cefadroxil     2) status post fall and acute metabolic encephalopathy--due to #1 above -Resolved mentation back to baseline -CT head without acute findings -Urine tox screen negative -Still having significant physical weakness, ambulating with assist   3) Episodes of hypoglycemia---  -Much improved -Improved oral intake     4)Rhabdomyolysis--- patient had a fall at home prior to admission CK 2,153 >>955 >>> 218, 186  Continuing IV fluids -Improving with hydration---monitoring CPK and creatinine -Stopped atorvastatin    5) Acute anemia--- hemoglobin is down to 11.6 from 14.9 on admission- -patient's baseline hemoglobin usually around 12 -Suspect that patient was hemoconcentrated on admission -Now developing anemia due to hemodilution from aggressive sepsis protocol IV fluids -Hgb stable -No evidence of ongoing bleeding   6)Hypokalemia--replace and recheck   7)Hypothyroidism--- continue levothyroxine    8)HTN--hypotensive, lisinopril  discontinued As blood pressures improved Coreg  and Lasix  were restarted    9)DM2-A1c 6.0 reflecting excellent diabetic control PTA -PTA Victoza , Farxiga  and metformin  were on hold -Hypoglycemic episode as above #3 due to poor oral intake -Solved with improved oral intake restarted metformin         Disposition: Nursing home Diet recommendation:  Discharge Diet Orders (From admission, onward)     Start     Ordered   01/01/24 0000  Diet - low sodium heart healthy        01/01/24 1101           Carb modified diet DISCHARGE MEDICATION: Allergies as of 01/02/2024       Reactions   Betadine [povidone Iodine] Hives   Penicillins Rash        Medication List     STOP taking these medications  atorvastatin  40 MG tablet Commonly known as: LIPITOR   baclofen  10 MG tablet Commonly known as: LIORESAL    escitalopram 20 MG tablet Commonly known as: LEXAPRO   fluticasone  50  MCG/ACT nasal spray Commonly known as: FLONASE    HYDROcodone -acetaminophen  5-325 MG tablet Commonly known as: NORCO/VICODIN   lisinopril  10 MG tablet Commonly known as: ZESTRIL    metFORMIN  500 MG 24 hr tablet Commonly known as: GLUCOPHAGE -XR Replaced by: metFORMIN  500 MG tablet   methocarbamol  500 MG tablet Commonly known as: ROBAXIN    repaglinide  1 MG tablet Commonly known as: PRANDIN    traMADol  50 MG tablet Commonly known as: ULTRAM    Trintellix  10 MG Tabs tablet Generic drug: vortioxetine  HBr   Victoza  18 MG/3ML Sopn Generic drug: liraglutide        TAKE these medications    Accu-Chek Aviva Plus test strip Generic drug: glucose blood TEST TWICE DAILY.   Accu-Chek Guide Me w/Device Kit   Accu-Chek Softclix Lancets lancets SMARTSIG:Topical   acetaminophen  325 MG tablet Commonly known as: TYLENOL  Take 2 tablets (650 mg total) by mouth every 6 (six) hours as needed for mild pain (pain score 1-3) (or Fever >/= 101).   acidophilus Caps capsule Take 2 capsules by mouth 3 (three) times daily for 5 days.   albuterol  108 (90 Base) MCG/ACT inhaler Commonly known as: VENTOLIN  HFA Inhale 2 puffs into the lungs every 4 (four) hours as needed for wheezing or shortness of breath.   ALPRAZolam  0.25 MG tablet Commonly known as: XANAX  Take 1 tablet (0.25 mg total) by mouth daily as needed.   aspirin  81 MG tablet Take 81 mg by mouth daily.   Breo Ellipta  200-25 MCG/INH Aepb Generic drug: fluticasone  furoate-vilanterol Inhale 1 puff into the lungs daily.   carvedilol  3.125 MG tablet Commonly known as: COREG  TAKE ONE TABLET BY MOUTH TWICE A DAY   cefadroxil  500 MG capsule Commonly known as: DURICEF Take 1 capsule (500 mg total) by mouth 2 (two) times daily for 5 days.   cycloSPORINE  0.05 % ophthalmic emulsion Commonly known as: RESTASIS  Place 1 drop into both eyes 2 (two) times daily.   docusate sodium  100 MG capsule Commonly known as: COLACE Take 1 capsule  (100 mg total) by mouth 2 (two) times daily.   estradiol  1 MG tablet Commonly known as: ESTRACE  TAKE ONE TABLET (1MG  TOTAL) BY MOUTH DAILY What changed: See the new instructions.   Farxiga  5 MG Tabs tablet Generic drug: dapagliflozin  propanediol Take 2.5 mg by mouth daily before breakfast. What changed: how much to take   furosemide  20 MG tablet Commonly known as: LASIX  May take Lasix  20 mg as NEEDED for leg swelling   Gemtesa 75 MG Tabs Generic drug: Vibegron Take 75 mg by mouth at bedtime.   levothyroxine  75 MCG tablet Commonly known as: SYNTHROID  Take 50 mcg by mouth daily.   metFORMIN  500 MG tablet Commonly known as: GLUCOPHAGE  Take 1 tablet (500 mg total) by mouth 2 (two) times daily with a meal. Replaces: metFORMIN  500 MG 24 hr tablet   multivitamins ther. w/minerals Tabs tablet Take 1 tablet by mouth every morning.   omeprazole  20 MG capsule Commonly known as: PRILOSEC Take 20 mg by mouth every morning.   pregabalin  150 MG capsule Commonly known as: LYRICA  TAKE ONE CAPSULE (150 MG TOTAL) BY MOUTHTWO TIMES DAILY.   rizatriptan  10 MG disintegrating tablet Commonly known as: MAXALT -MLT Take 10 mg by mouth as needed for migraine. May repeat in 2 hours if needed  Unifine Pentips 31G X 6 MM Misc Generic drug: Insulin  Pen Needle USE TO INJECT VICTOZA  ONCE DAILY   venlafaxine  XR 150 MG 24 hr capsule Commonly known as: EFFEXOR -XR Take 150 mg by mouth every morning.               Discharge Care Instructions  (From admission, onward)           Start     Ordered   01/02/24 0000  Discharge wound care:       Comments: Per RN instructions   01/02/24 1032   01/01/24 0000  Discharge wound care:       Comments: Pressure Injury 12/26/23 Buttocks Left;Medial;Mid Stage 2 -  Partial thickness loss of dermis presenting as a shallow open injury with a red, pink wound bed without slough. pink/red 5 days   Pressure Injury 12/26/23 Buttocks Right;Medial;Mid Stage  2 -  Partial thickness loss of dermis presenting as a shallow open injury with a red, pink wound bed without slough. pink/red   01/01/24 1101            Contact information for follow-up providers     [facility], [facility] Follow up.   Specialty: Home Health Services Why: [facility] representative will contact family at DC. Contact information: [address] [place] [address] [phone]              Contact information for after-discharge care     Destination     [facility] .   Service: Skilled Nursing Contact information: [address] [facility] [place]  [address] [phone]                    Discharge Exam: Fredricka Weights   12/25/23 2216 12/26/23 1218  Weight: 60 kg 62.9 kg     No change in exam   General:  AAO x 3,  cooperative, no distress;   HEENT:  Normocephalic, PERRL, otherwise with in Normal limits   Neuro:  CNII-XII intact. , normal motor and sensation, reflexes intact   Lungs:   Clear to auscultation BL, Respirations unlabored,  No wheezes / crackles  Cardio:    S1/S2, RRR, No murmure, No Rubs or Gallops   Abdomen:  Soft, non-tender, bowel sounds active all four quadrants, no guarding or peritoneal signs.  Muscular  skeletal:  Limited exam -global generalized weaknesses - in bed, able to move all 4 extremities,   2+ pulses,  symmetric, No pitting edema  Skin:  Dry, warm to touch, negative for any Rashes,  Wounds: Please see nursing documentation  Pressure Injury 12/26/23 Buttocks Right;Medial;Mid Stage 2 -  Partial thickness loss of dermis presenting as a shallow open injury with a red, pink wound bed without slough. pink/red (Active)  12/26/23 1230  Location: Buttocks  Location Orientation: Right;Medial;Mid  Staging: Stage 2 -  Partial thickness loss of dermis presenting as a shallow open injury with a red, pink wound bed without slough.  Wound Description (Comments): pink/red   Present on Admission: Yes  Dressing Type Foam - Lift dressing to assess site every shift 12/31/23 2141     Pressure Injury 12/26/23 Buttocks Left;Medial;Mid Stage 2 -  Partial thickness loss of dermis presenting as a shallow open injury with a red, pink wound bed without slough. pink/red (Active)  12/26/23 1230  Location: Buttocks  Location Orientation: Left;Medial;Mid  Staging: Stage 2 -  Partial thickness loss of dermis presenting as a shallow open injury with  a red, pink wound bed without slough.  Wound Description (Comments): pink/red  Present on Admission: Yes  Dressing Type Foam - Lift dressing to assess site every shift 12/31/23 2141          Condition at discharge: good  The results of significant diagnostics from this hospitalization (including imaging, microbiology, ancillary and laboratory) are listed below for reference.   Imaging Studies: DG Shoulder Left Port Result Date: 12/26/2023 CLINICAL DATA:  Post fall, now with left shoulder pain. EXAM: LEFT SHOULDER COMPARISON:  03/10/2022; 02/14/2022 FINDINGS: No fracture or dislocation. Mild degenerative change of the glenohumeral and acromioclavicular joints with joint space loss, subchondral sclerosis and osteophytosis. No evidence of calcific tendinitis. Limited visualization of the adjacent thorax is normal given obliquity and technique. Regional soft tissues appear normal. IMPRESSION: 1. No acute findings. 2. Mild degenerative change of the left glenohumeral and acromioclavicular joints. Electronically Signed   By: Norleen Roulette M.D.   On: 12/26/2023 08:24   DG Chest Port 1 View Result Date: 12/26/2023 CLINICAL DATA:  Questionable sepsis - evaluate for abnormality EXAM: PORTABLE CHEST 1 VIEW COMPARISON:  Chest CT performed concurrently. FINDINGS: The cardiomediastinal contours are normal. Aortic atherosclerosis. Pulmonary vasculature is normal. No consolidation, pleural effusion, or pneumothorax. No acute osseous abnormalities  are seen. IMPRESSION: No active disease. Electronically Signed   By: Andrea Gasman M.D.   On: 12/26/2023 01:54   CT CHEST ABDOMEN PELVIS WO CONTRAST Result Date: 12/26/2023 CLINICAL DATA:  Unwitnessed fall. EXAM: CT CHEST, ABDOMEN AND PELVIS WITHOUT CONTRAST TECHNIQUE: Multidetector CT imaging of the chest, abdomen and pelvis was performed following the standard protocol without IV contrast. RADIATION DOSE REDUCTION: This exam was performed according to the departmental dose-optimization program which includes automated exposure control, adjustment of the mA and/or kV according to patient size and/or use of iterative reconstruction technique. COMPARISON:  Noncontrast CT 04/02/2020 FINDINGS: CT CHEST FINDINGS Cardiovascular: Assessment for acute injury is limited in the absence of IV contrast. The heart is normal in size. Mitral annulus calcifications. Atherosclerosis and tortuosity of the thoracic aorta. No pericardial effusion. Mediastinum/Nodes: No mediastinal adenopathy. No mediastinal hemorrhage. Hilar assessment is limited in the absence of IV contrast. Postsurgical change at the gastroesophageal junction. No pneumomediastinum. Lungs/Pleura: No pneumothorax. No focal airspace disease or pulmonary contusion. No pleural effusion. No pulmonary mass. There is mild breathing motion artifact. Musculoskeletal: No acute fracture of the ribs, sternum, included clavicles or shoulder girdles. Degenerative change in the thoracic spine without acute fracture. No confluent chest wall contusion. CT ABDOMEN PELVIS FINDINGS Noncontrast CT imaging in the setting of acute trauma significantly limits the detection of subtle though potentially significant traumatic findings. Hepatobiliary: No focal liver abnormality or evidence of injury. No perihepatic hematoma. Clips in the gallbladder fossa postcholecystectomy. No biliary dilatation. Pancreas: Parenchymal atrophy. No evidence of injury. No ductal dilatation or  inflammation. Chronic calcification of the pancreatic head. Spleen: Homogeneous attenuation.  No perisplenic hematoma. Adrenals/Urinary Tract: No adrenal hemorrhage. Unchanged left adrenal adenoma, needing no further imaging follow-up. No evidence of renal injury or hydronephrosis. Again seen right renal cyst. No further follow-up imaging is recommended. Decompressed urinary bladder, not well assessed Stomach/Bowel: Postsurgical change at the gastroesophageal junction. No evidence of bowel inflammation or injury. No obstruction. Moderate stool in the distal colon. Mild left colonic diverticulosis. No diverticulitis. Vascular/Lymphatic: Mild aortic atherosclerosis. No retroperitoneal fluid. No adenopathy. Reproductive: Status post hysterectomy. No adnexal masses. Other: No ascites or free air. Small fat containing supraumbilical midline ventral abdominal wall hernia. Chronic  soft tissue density in the upper anterior abdominal wall may be scarring or related to medication injection sites. Musculoskeletal: No acute fracture of the pelvis or lumbar spine. IMPRESSION: 1. No acute traumatic injury of the chest, abdomen, or pelvis. 2. Stable chronic findings as described. Aortic Atherosclerosis (ICD10-I70.0). Electronically Signed   By: Andrea Gasman M.D.   On: 12/26/2023 01:53   CT HEAD WO CONTRAST Result Date: 12/26/2023 CLINICAL DATA:  Fall EXAM: CT HEAD WITHOUT CONTRAST CT CERVICAL SPINE WITHOUT CONTRAST TECHNIQUE: Multidetector CT imaging of the head and cervical spine was performed following the standard protocol without intravenous contrast. Multiplanar CT image reconstructions of the cervical spine were also generated. RADIATION DOSE REDUCTION: This exam was performed according to the departmental dose-optimization program which includes automated exposure control, adjustment of the mA and/or kV according to patient size and/or use of iterative reconstruction technique. COMPARISON:  None Available. FINDINGS:  CT HEAD FINDINGS Brain: Unchanged appearance of suprasellar mass. Chronic ischemic white matter changes. Mild generalized volume loss. No mass, hemorrhage or extra-axial collection. Old right cerebellar infarct. Vascular: No hyperdense vessel or unexpected vascular calcification. Skull: The visualized skull base, calvarium and extracranial soft tissues are normal. Sinuses/Orbits: No fluid levels or advanced mucosal thickening of the visualized paranasal sinuses. No mastoid or middle ear effusion. Normal orbits. Other: None. CT CERVICAL SPINE FINDINGS Alignment: No static subluxation. Facets are aligned. Occipital condyles are normally positioned. Skull base and vertebrae: No acute fracture. Soft tissues and spinal canal: No prevertebral fluid or swelling. No visible canal hematoma. Disc levels: No advanced spinal canal or neural foraminal stenosis. Upper chest: No pneumothorax, pulmonary nodule or pleural effusion. Other: Normal visualized paraspinal cervical soft tissues. IMPRESSION: 1. No acute intracranial abnormality. 2. No acute fracture or static subluxation of the cervical spine. 3. Unchanged appearance of suprasellar mass. Electronically Signed   By: Franky Stanford M.D.   On: 12/26/2023 01:11   CT CERVICAL SPINE WO CONTRAST Result Date: 12/26/2023 CLINICAL DATA:  Fall EXAM: CT HEAD WITHOUT CONTRAST CT CERVICAL SPINE WITHOUT CONTRAST TECHNIQUE: Multidetector CT imaging of the head and cervical spine was performed following the standard protocol without intravenous contrast. Multiplanar CT image reconstructions of the cervical spine were also generated. RADIATION DOSE REDUCTION: This exam was performed according to the departmental dose-optimization program which includes automated exposure control, adjustment of the mA and/or kV according to patient size and/or use of iterative reconstruction technique. COMPARISON:  None Available. FINDINGS: CT HEAD FINDINGS Brain: Unchanged appearance of suprasellar mass.  Chronic ischemic white matter changes. Mild generalized volume loss. No mass, hemorrhage or extra-axial collection. Old right cerebellar infarct. Vascular: No hyperdense vessel or unexpected vascular calcification. Skull: The visualized skull base, calvarium and extracranial soft tissues are normal. Sinuses/Orbits: No fluid levels or advanced mucosal thickening of the visualized paranasal sinuses. No mastoid or middle ear effusion. Normal orbits. Other: None. CT CERVICAL SPINE FINDINGS Alignment: No static subluxation. Facets are aligned. Occipital condyles are normally positioned. Skull base and vertebrae: No acute fracture. Soft tissues and spinal canal: No prevertebral fluid or swelling. No visible canal hematoma. Disc levels: No advanced spinal canal or neural foraminal stenosis. Upper chest: No pneumothorax, pulmonary nodule or pleural effusion. Other: Normal visualized paraspinal cervical soft tissues. IMPRESSION: 1. No acute intracranial abnormality. 2. No acute fracture or static subluxation of the cervical spine. 3. Unchanged appearance of suprasellar mass. Electronically Signed   By: Franky Stanford M.D.   On: 12/26/2023 01:11    Microbiology: Results for orders placed  or performed during the hospital encounter of 12/25/23  Blood Culture (routine x 2)     Status: None   Collection Time: 12/25/23 11:18 PM   Specimen: BLOOD  Result Value Ref Range Status   Specimen Description BLOOD BLOOD RIGHT ARM  Final   Special Requests   Final    BOTTLES DRAWN AEROBIC ONLY Blood Culture adequate volume   Culture   Final    NO GROWTH 5 DAYS Performed at [facility], [address] [place], [place] [address]    Report Status 12/30/2023 FINAL  Final  Blood Culture (routine x 2)     Status: None   Collection Time: 12/25/23 11:30 PM   Specimen: BLOOD  Result Value Ref Range Status   Specimen Description BLOOD BLOOD RIGHT HAND  Final   Special Requests   Final    BOTTLES DRAWN AEROBIC ONLY Blood Culture  results may not be optimal due to an inadequate volume of blood received in culture bottles   Culture   Final    NO GROWTH 5 DAYS Performed at [facility], [address] [place], [place] [address]    Report Status 12/30/2023 FINAL  Final  Urine Culture (for pregnant, neutropenic or urologic patients or patients with an indwelling urinary catheter)     Status: Abnormal   Collection Time: 12/26/23  2:20 AM   Specimen: Urine, Clean Catch  Result Value Ref Range Status   Specimen Description   Final    URINE, CLEAN CATCH Performed at [facility], [address], [place], [place] [address]    Special Requests   Final    Normal Performed at [facility], [address], [place], [place] [address]    Culture >=100,000 COLONIES/mL ESCHERICHIA COLI (A)  Final   Report Status 12/28/2023 FINAL  Final   Organism ID, Bacteria ESCHERICHIA COLI (A)  Final      Susceptibility   Escherichia coli - MIC*    AMPICILLIN <=2 SENSITIVE Sensitive     CEFAZOLIN  <=4 SENSITIVE Sensitive     CEFEPIME  <=0.12 SENSITIVE Sensitive     CEFTRIAXONE  <=0.25 SENSITIVE Sensitive     CIPROFLOXACIN  >=4 RESISTANT Resistant     GENTAMICIN <=1 SENSITIVE Sensitive     IMIPENEM <=0.25 SENSITIVE Sensitive     NITROFURANTOIN  <=16 SENSITIVE Sensitive     TRIMETH/SULFA <=20 SENSITIVE Sensitive     AMPICILLIN/SULBACTAM <=2 SENSITIVE Sensitive     PIP/TAZO <=4 SENSITIVE Sensitive ug/mL    * >=100,000 COLONIES/mL ESCHERICHIA COLI  MRSA Next Gen by PCR, Nasal     Status: None   Collection Time: 12/26/23 10:37 AM   Specimen: Nasal Mucosa; Nasal Swab  Result Value Ref Range Status   MRSA by PCR Next Gen NOT DETECTED NOT DETECTED Final    Comment: (NOTE) The GeneXpert MRSA Assay (FDA approved for NASAL specimens only), is one component of a comprehensive MRSA colonization surveillance program. It is not intended to diagnose MRSA infection nor to guide or monitor treatment for MRSA infections. Test performance is not FDA  approved in patients less than 85 years old. Performed at [facility], [address] [place], [place] [address]     Labs: CBC: Recent Labs  Lab 12/27/23 0513 12/28/23 0434  WBC 10.5  --   HGB 11.6* 11.6*  HCT 35.9* 35.8*  MCV 90.0  --   PLT 167  --    Basic Metabolic Panel: Recent Labs  Lab 12/27/23 0513 12/28/23 0434 12/29/23 0424  NA 136 138  138  K 3.4* 3.5 3.6  CL 105 109 109  CO2 22 22 23   GLUCOSE 87 142* 149*  BUN 8 5* 7*  CREATININE 0.65 0.52 0.56  CALCIUM  7.9* 8.1* 8.1*   Liver Function Tests: Recent Labs  Lab 12/27/23 0513  AST 45*  ALT 27  ALKPHOS 32*  BILITOT 1.2  PROT 5.7*  ALBUMIN 2.7*   CBG: Recent Labs  Lab 01/01/24 0732 01/01/24 1124 01/01/24 1620 01/01/24 2037 01/02/24 0754  GLUCAP 89 108* 110* 129* 106*    Discharge time spent: greater than 30 minutes.  Signed: Adriana DELENA Grams, MD [facility] 01/02/2024

## 2024-01-02 NOTE — Plan of Care (Signed)

## 2024-01-02 NOTE — Progress Notes (Signed)
 Gave report to nurse [place] at [facility].

## 2024-01-05 DIAGNOSIS — N390 Urinary tract infection, site not specified: Secondary | ICD-10-CM | POA: Diagnosis not present

## 2024-01-05 DIAGNOSIS — E119 Type 2 diabetes mellitus without complications: Secondary | ICD-10-CM | POA: Diagnosis not present

## 2024-01-05 DIAGNOSIS — E039 Hypothyroidism, unspecified: Secondary | ICD-10-CM | POA: Diagnosis not present

## 2024-01-05 DIAGNOSIS — R42 Dizziness and giddiness: Secondary | ICD-10-CM | POA: Diagnosis not present

## 2024-01-05 DIAGNOSIS — R262 Difficulty in walking, not elsewhere classified: Secondary | ICD-10-CM | POA: Diagnosis not present

## 2024-01-05 DIAGNOSIS — D352 Benign neoplasm of pituitary gland: Secondary | ICD-10-CM | POA: Diagnosis not present

## 2024-01-05 DIAGNOSIS — G9341 Metabolic encephalopathy: Secondary | ICD-10-CM | POA: Diagnosis not present

## 2024-01-05 DIAGNOSIS — J45909 Unspecified asthma, uncomplicated: Secondary | ICD-10-CM | POA: Diagnosis not present

## 2024-01-05 DIAGNOSIS — M6282 Rhabdomyolysis: Secondary | ICD-10-CM | POA: Diagnosis not present

## 2024-01-05 DIAGNOSIS — E893 Postprocedural hypopituitarism: Secondary | ICD-10-CM | POA: Diagnosis not present

## 2024-01-05 DIAGNOSIS — M6281 Muscle weakness (generalized): Secondary | ICD-10-CM | POA: Diagnosis not present

## 2024-01-08 DIAGNOSIS — R5381 Other malaise: Secondary | ICD-10-CM | POA: Diagnosis not present

## 2024-01-08 DIAGNOSIS — N390 Urinary tract infection, site not specified: Secondary | ICD-10-CM | POA: Diagnosis not present

## 2024-01-08 DIAGNOSIS — A419 Sepsis, unspecified organism: Secondary | ICD-10-CM | POA: Diagnosis not present

## 2024-01-10 DIAGNOSIS — M6281 Muscle weakness (generalized): Secondary | ICD-10-CM | POA: Diagnosis not present

## 2024-01-10 DIAGNOSIS — E119 Type 2 diabetes mellitus without complications: Secondary | ICD-10-CM | POA: Diagnosis not present

## 2024-01-10 DIAGNOSIS — R5381 Other malaise: Secondary | ICD-10-CM | POA: Diagnosis not present

## 2024-01-22 DIAGNOSIS — M810 Age-related osteoporosis without current pathological fracture: Secondary | ICD-10-CM | POA: Diagnosis not present

## 2024-01-22 DIAGNOSIS — E1140 Type 2 diabetes mellitus with diabetic neuropathy, unspecified: Secondary | ICD-10-CM | POA: Diagnosis not present

## 2024-01-22 DIAGNOSIS — E039 Hypothyroidism, unspecified: Secondary | ICD-10-CM | POA: Diagnosis not present

## 2024-01-22 DIAGNOSIS — Z87440 Personal history of urinary (tract) infections: Secondary | ICD-10-CM | POA: Diagnosis not present

## 2024-01-22 DIAGNOSIS — I10 Essential (primary) hypertension: Secondary | ICD-10-CM | POA: Diagnosis not present

## 2024-01-22 DIAGNOSIS — I872 Venous insufficiency (chronic) (peripheral): Secondary | ICD-10-CM | POA: Diagnosis not present

## 2024-01-22 DIAGNOSIS — E893 Postprocedural hypopituitarism: Secondary | ICD-10-CM | POA: Diagnosis not present

## 2024-01-22 DIAGNOSIS — Z7984 Long term (current) use of oral hypoglycemic drugs: Secondary | ICD-10-CM | POA: Diagnosis not present

## 2024-01-22 DIAGNOSIS — G43909 Migraine, unspecified, not intractable, without status migrainosus: Secondary | ICD-10-CM | POA: Diagnosis not present

## 2024-01-22 DIAGNOSIS — M1990 Unspecified osteoarthritis, unspecified site: Secondary | ICD-10-CM | POA: Diagnosis not present

## 2024-01-22 DIAGNOSIS — E785 Hyperlipidemia, unspecified: Secondary | ICD-10-CM | POA: Diagnosis not present

## 2024-01-22 DIAGNOSIS — Z86018 Personal history of other benign neoplasm: Secondary | ICD-10-CM | POA: Diagnosis not present

## 2024-01-22 DIAGNOSIS — J45909 Unspecified asthma, uncomplicated: Secondary | ICD-10-CM | POA: Diagnosis not present

## 2024-01-22 DIAGNOSIS — M6282 Rhabdomyolysis: Secondary | ICD-10-CM | POA: Diagnosis not present

## 2024-01-22 DIAGNOSIS — K219 Gastro-esophageal reflux disease without esophagitis: Secondary | ICD-10-CM | POA: Diagnosis not present

## 2024-01-22 DIAGNOSIS — I471 Supraventricular tachycardia, unspecified: Secondary | ICD-10-CM | POA: Diagnosis not present

## 2024-01-24 DIAGNOSIS — I10 Essential (primary) hypertension: Secondary | ICD-10-CM | POA: Diagnosis not present

## 2024-01-24 DIAGNOSIS — E1165 Type 2 diabetes mellitus with hyperglycemia: Secondary | ICD-10-CM | POA: Diagnosis not present

## 2024-01-24 DIAGNOSIS — E162 Hypoglycemia, unspecified: Secondary | ICD-10-CM | POA: Diagnosis not present

## 2024-01-24 DIAGNOSIS — M6282 Rhabdomyolysis: Secondary | ICD-10-CM | POA: Diagnosis not present

## 2024-01-24 DIAGNOSIS — E11649 Type 2 diabetes mellitus with hypoglycemia without coma: Secondary | ICD-10-CM | POA: Diagnosis not present

## 2024-01-24 DIAGNOSIS — R350 Frequency of micturition: Secondary | ICD-10-CM | POA: Diagnosis not present

## 2024-01-24 DIAGNOSIS — A419 Sepsis, unspecified organism: Secondary | ICD-10-CM | POA: Diagnosis not present

## 2024-01-25 DIAGNOSIS — J45909 Unspecified asthma, uncomplicated: Secondary | ICD-10-CM | POA: Diagnosis not present

## 2024-01-25 DIAGNOSIS — E1140 Type 2 diabetes mellitus with diabetic neuropathy, unspecified: Secondary | ICD-10-CM | POA: Diagnosis not present

## 2024-01-25 DIAGNOSIS — E039 Hypothyroidism, unspecified: Secondary | ICD-10-CM | POA: Diagnosis not present

## 2024-01-25 DIAGNOSIS — Z87440 Personal history of urinary (tract) infections: Secondary | ICD-10-CM | POA: Diagnosis not present

## 2024-01-25 DIAGNOSIS — M1990 Unspecified osteoarthritis, unspecified site: Secondary | ICD-10-CM | POA: Diagnosis not present

## 2024-01-25 DIAGNOSIS — Z86018 Personal history of other benign neoplasm: Secondary | ICD-10-CM | POA: Diagnosis not present

## 2024-01-25 DIAGNOSIS — M810 Age-related osteoporosis without current pathological fracture: Secondary | ICD-10-CM | POA: Diagnosis not present

## 2024-01-25 DIAGNOSIS — M6282 Rhabdomyolysis: Secondary | ICD-10-CM | POA: Diagnosis not present

## 2024-01-25 DIAGNOSIS — G43909 Migraine, unspecified, not intractable, without status migrainosus: Secondary | ICD-10-CM | POA: Diagnosis not present

## 2024-01-25 DIAGNOSIS — E893 Postprocedural hypopituitarism: Secondary | ICD-10-CM | POA: Diagnosis not present

## 2024-01-25 DIAGNOSIS — I872 Venous insufficiency (chronic) (peripheral): Secondary | ICD-10-CM | POA: Diagnosis not present

## 2024-01-25 DIAGNOSIS — I471 Supraventricular tachycardia, unspecified: Secondary | ICD-10-CM | POA: Diagnosis not present

## 2024-01-25 DIAGNOSIS — I10 Essential (primary) hypertension: Secondary | ICD-10-CM | POA: Diagnosis not present

## 2024-01-25 DIAGNOSIS — Z7984 Long term (current) use of oral hypoglycemic drugs: Secondary | ICD-10-CM | POA: Diagnosis not present

## 2024-01-25 DIAGNOSIS — E785 Hyperlipidemia, unspecified: Secondary | ICD-10-CM | POA: Diagnosis not present

## 2024-01-25 DIAGNOSIS — K219 Gastro-esophageal reflux disease without esophagitis: Secondary | ICD-10-CM | POA: Diagnosis not present

## 2024-01-26 DIAGNOSIS — M542 Cervicalgia: Secondary | ICD-10-CM | POA: Diagnosis not present

## 2024-01-26 DIAGNOSIS — G8929 Other chronic pain: Secondary | ICD-10-CM | POA: Diagnosis not present

## 2024-01-30 DIAGNOSIS — M810 Age-related osteoporosis without current pathological fracture: Secondary | ICD-10-CM | POA: Diagnosis not present

## 2024-01-30 DIAGNOSIS — M6282 Rhabdomyolysis: Secondary | ICD-10-CM | POA: Diagnosis not present

## 2024-01-30 DIAGNOSIS — E893 Postprocedural hypopituitarism: Secondary | ICD-10-CM | POA: Diagnosis not present

## 2024-01-30 DIAGNOSIS — Z87440 Personal history of urinary (tract) infections: Secondary | ICD-10-CM | POA: Diagnosis not present

## 2024-01-30 DIAGNOSIS — K219 Gastro-esophageal reflux disease without esophagitis: Secondary | ICD-10-CM | POA: Diagnosis not present

## 2024-01-30 DIAGNOSIS — E1140 Type 2 diabetes mellitus with diabetic neuropathy, unspecified: Secondary | ICD-10-CM | POA: Diagnosis not present

## 2024-01-30 DIAGNOSIS — I872 Venous insufficiency (chronic) (peripheral): Secondary | ICD-10-CM | POA: Diagnosis not present

## 2024-01-30 DIAGNOSIS — G43909 Migraine, unspecified, not intractable, without status migrainosus: Secondary | ICD-10-CM | POA: Diagnosis not present

## 2024-01-30 DIAGNOSIS — J45909 Unspecified asthma, uncomplicated: Secondary | ICD-10-CM | POA: Diagnosis not present

## 2024-01-30 DIAGNOSIS — Z86018 Personal history of other benign neoplasm: Secondary | ICD-10-CM | POA: Diagnosis not present

## 2024-01-30 DIAGNOSIS — Z7984 Long term (current) use of oral hypoglycemic drugs: Secondary | ICD-10-CM | POA: Diagnosis not present

## 2024-01-30 DIAGNOSIS — E039 Hypothyroidism, unspecified: Secondary | ICD-10-CM | POA: Diagnosis not present

## 2024-01-30 DIAGNOSIS — I10 Essential (primary) hypertension: Secondary | ICD-10-CM | POA: Diagnosis not present

## 2024-01-30 DIAGNOSIS — I471 Supraventricular tachycardia, unspecified: Secondary | ICD-10-CM | POA: Diagnosis not present

## 2024-01-30 DIAGNOSIS — E785 Hyperlipidemia, unspecified: Secondary | ICD-10-CM | POA: Diagnosis not present

## 2024-01-30 DIAGNOSIS — M1990 Unspecified osteoarthritis, unspecified site: Secondary | ICD-10-CM | POA: Diagnosis not present

## 2024-01-31 DIAGNOSIS — I10 Essential (primary) hypertension: Secondary | ICD-10-CM | POA: Diagnosis not present

## 2024-01-31 DIAGNOSIS — E785 Hyperlipidemia, unspecified: Secondary | ICD-10-CM | POA: Diagnosis not present

## 2024-01-31 DIAGNOSIS — Z7984 Long term (current) use of oral hypoglycemic drugs: Secondary | ICD-10-CM | POA: Diagnosis not present

## 2024-01-31 DIAGNOSIS — G43909 Migraine, unspecified, not intractable, without status migrainosus: Secondary | ICD-10-CM | POA: Diagnosis not present

## 2024-01-31 DIAGNOSIS — M1990 Unspecified osteoarthritis, unspecified site: Secondary | ICD-10-CM | POA: Diagnosis not present

## 2024-01-31 DIAGNOSIS — J45909 Unspecified asthma, uncomplicated: Secondary | ICD-10-CM | POA: Diagnosis not present

## 2024-01-31 DIAGNOSIS — E893 Postprocedural hypopituitarism: Secondary | ICD-10-CM | POA: Diagnosis not present

## 2024-01-31 DIAGNOSIS — M810 Age-related osteoporosis without current pathological fracture: Secondary | ICD-10-CM | POA: Diagnosis not present

## 2024-01-31 DIAGNOSIS — Z86018 Personal history of other benign neoplasm: Secondary | ICD-10-CM | POA: Diagnosis not present

## 2024-01-31 DIAGNOSIS — I471 Supraventricular tachycardia, unspecified: Secondary | ICD-10-CM | POA: Diagnosis not present

## 2024-01-31 DIAGNOSIS — I872 Venous insufficiency (chronic) (peripheral): Secondary | ICD-10-CM | POA: Diagnosis not present

## 2024-01-31 DIAGNOSIS — Z87440 Personal history of urinary (tract) infections: Secondary | ICD-10-CM | POA: Diagnosis not present

## 2024-01-31 DIAGNOSIS — M6282 Rhabdomyolysis: Secondary | ICD-10-CM | POA: Diagnosis not present

## 2024-01-31 DIAGNOSIS — K219 Gastro-esophageal reflux disease without esophagitis: Secondary | ICD-10-CM | POA: Diagnosis not present

## 2024-01-31 DIAGNOSIS — E039 Hypothyroidism, unspecified: Secondary | ICD-10-CM | POA: Diagnosis not present

## 2024-01-31 DIAGNOSIS — E1140 Type 2 diabetes mellitus with diabetic neuropathy, unspecified: Secondary | ICD-10-CM | POA: Diagnosis not present

## 2024-02-01 DIAGNOSIS — I872 Venous insufficiency (chronic) (peripheral): Secondary | ICD-10-CM | POA: Diagnosis not present

## 2024-02-01 DIAGNOSIS — Z7984 Long term (current) use of oral hypoglycemic drugs: Secondary | ICD-10-CM | POA: Diagnosis not present

## 2024-02-01 DIAGNOSIS — I10 Essential (primary) hypertension: Secondary | ICD-10-CM | POA: Diagnosis not present

## 2024-02-01 DIAGNOSIS — I471 Supraventricular tachycardia, unspecified: Secondary | ICD-10-CM | POA: Diagnosis not present

## 2024-02-01 DIAGNOSIS — E039 Hypothyroidism, unspecified: Secondary | ICD-10-CM | POA: Diagnosis not present

## 2024-02-01 DIAGNOSIS — M1990 Unspecified osteoarthritis, unspecified site: Secondary | ICD-10-CM | POA: Diagnosis not present

## 2024-02-01 DIAGNOSIS — M810 Age-related osteoporosis without current pathological fracture: Secondary | ICD-10-CM | POA: Diagnosis not present

## 2024-02-01 DIAGNOSIS — J45909 Unspecified asthma, uncomplicated: Secondary | ICD-10-CM | POA: Diagnosis not present

## 2024-02-01 DIAGNOSIS — E893 Postprocedural hypopituitarism: Secondary | ICD-10-CM | POA: Diagnosis not present

## 2024-02-01 DIAGNOSIS — M6282 Rhabdomyolysis: Secondary | ICD-10-CM | POA: Diagnosis not present

## 2024-02-01 DIAGNOSIS — Z87440 Personal history of urinary (tract) infections: Secondary | ICD-10-CM | POA: Diagnosis not present

## 2024-02-01 DIAGNOSIS — Z86018 Personal history of other benign neoplasm: Secondary | ICD-10-CM | POA: Diagnosis not present

## 2024-02-01 DIAGNOSIS — K219 Gastro-esophageal reflux disease without esophagitis: Secondary | ICD-10-CM | POA: Diagnosis not present

## 2024-02-01 DIAGNOSIS — E785 Hyperlipidemia, unspecified: Secondary | ICD-10-CM | POA: Diagnosis not present

## 2024-02-01 DIAGNOSIS — G43909 Migraine, unspecified, not intractable, without status migrainosus: Secondary | ICD-10-CM | POA: Diagnosis not present

## 2024-02-01 DIAGNOSIS — E1140 Type 2 diabetes mellitus with diabetic neuropathy, unspecified: Secondary | ICD-10-CM | POA: Diagnosis not present

## 2024-02-06 DIAGNOSIS — E893 Postprocedural hypopituitarism: Secondary | ICD-10-CM | POA: Diagnosis not present

## 2024-02-06 DIAGNOSIS — Z87440 Personal history of urinary (tract) infections: Secondary | ICD-10-CM | POA: Diagnosis not present

## 2024-02-06 DIAGNOSIS — I10 Essential (primary) hypertension: Secondary | ICD-10-CM | POA: Diagnosis not present

## 2024-02-06 DIAGNOSIS — G43909 Migraine, unspecified, not intractable, without status migrainosus: Secondary | ICD-10-CM | POA: Diagnosis not present

## 2024-02-06 DIAGNOSIS — Z7984 Long term (current) use of oral hypoglycemic drugs: Secondary | ICD-10-CM | POA: Diagnosis not present

## 2024-02-06 DIAGNOSIS — M6282 Rhabdomyolysis: Secondary | ICD-10-CM | POA: Diagnosis not present

## 2024-02-06 DIAGNOSIS — K219 Gastro-esophageal reflux disease without esophagitis: Secondary | ICD-10-CM | POA: Diagnosis not present

## 2024-02-06 DIAGNOSIS — E039 Hypothyroidism, unspecified: Secondary | ICD-10-CM | POA: Diagnosis not present

## 2024-02-06 DIAGNOSIS — Z86018 Personal history of other benign neoplasm: Secondary | ICD-10-CM | POA: Diagnosis not present

## 2024-02-06 DIAGNOSIS — M810 Age-related osteoporosis without current pathological fracture: Secondary | ICD-10-CM | POA: Diagnosis not present

## 2024-02-06 DIAGNOSIS — I471 Supraventricular tachycardia, unspecified: Secondary | ICD-10-CM | POA: Diagnosis not present

## 2024-02-06 DIAGNOSIS — E785 Hyperlipidemia, unspecified: Secondary | ICD-10-CM | POA: Diagnosis not present

## 2024-02-06 DIAGNOSIS — M1990 Unspecified osteoarthritis, unspecified site: Secondary | ICD-10-CM | POA: Diagnosis not present

## 2024-02-06 DIAGNOSIS — E1140 Type 2 diabetes mellitus with diabetic neuropathy, unspecified: Secondary | ICD-10-CM | POA: Diagnosis not present

## 2024-02-06 DIAGNOSIS — I872 Venous insufficiency (chronic) (peripheral): Secondary | ICD-10-CM | POA: Diagnosis not present

## 2024-02-06 DIAGNOSIS — J45909 Unspecified asthma, uncomplicated: Secondary | ICD-10-CM | POA: Diagnosis not present

## 2024-02-08 DIAGNOSIS — E1140 Type 2 diabetes mellitus with diabetic neuropathy, unspecified: Secondary | ICD-10-CM | POA: Diagnosis not present

## 2024-02-08 DIAGNOSIS — Z87440 Personal history of urinary (tract) infections: Secondary | ICD-10-CM | POA: Diagnosis not present

## 2024-02-08 DIAGNOSIS — M6282 Rhabdomyolysis: Secondary | ICD-10-CM | POA: Diagnosis not present

## 2024-02-08 DIAGNOSIS — E785 Hyperlipidemia, unspecified: Secondary | ICD-10-CM | POA: Diagnosis not present

## 2024-02-08 DIAGNOSIS — Z7984 Long term (current) use of oral hypoglycemic drugs: Secondary | ICD-10-CM | POA: Diagnosis not present

## 2024-02-08 DIAGNOSIS — Z86018 Personal history of other benign neoplasm: Secondary | ICD-10-CM | POA: Diagnosis not present

## 2024-02-08 DIAGNOSIS — J45909 Unspecified asthma, uncomplicated: Secondary | ICD-10-CM | POA: Diagnosis not present

## 2024-02-08 DIAGNOSIS — I872 Venous insufficiency (chronic) (peripheral): Secondary | ICD-10-CM | POA: Diagnosis not present

## 2024-02-08 DIAGNOSIS — G43909 Migraine, unspecified, not intractable, without status migrainosus: Secondary | ICD-10-CM | POA: Diagnosis not present

## 2024-02-08 DIAGNOSIS — I471 Supraventricular tachycardia, unspecified: Secondary | ICD-10-CM | POA: Diagnosis not present

## 2024-02-08 DIAGNOSIS — E893 Postprocedural hypopituitarism: Secondary | ICD-10-CM | POA: Diagnosis not present

## 2024-02-08 DIAGNOSIS — K219 Gastro-esophageal reflux disease without esophagitis: Secondary | ICD-10-CM | POA: Diagnosis not present

## 2024-02-08 DIAGNOSIS — I10 Essential (primary) hypertension: Secondary | ICD-10-CM | POA: Diagnosis not present

## 2024-02-08 DIAGNOSIS — M1990 Unspecified osteoarthritis, unspecified site: Secondary | ICD-10-CM | POA: Diagnosis not present

## 2024-02-08 DIAGNOSIS — M810 Age-related osteoporosis without current pathological fracture: Secondary | ICD-10-CM | POA: Diagnosis not present

## 2024-02-08 DIAGNOSIS — E039 Hypothyroidism, unspecified: Secondary | ICD-10-CM | POA: Diagnosis not present

## 2024-02-12 DIAGNOSIS — M1990 Unspecified osteoarthritis, unspecified site: Secondary | ICD-10-CM | POA: Diagnosis not present

## 2024-02-12 DIAGNOSIS — K219 Gastro-esophageal reflux disease without esophagitis: Secondary | ICD-10-CM | POA: Diagnosis not present

## 2024-02-12 DIAGNOSIS — Z7984 Long term (current) use of oral hypoglycemic drugs: Secondary | ICD-10-CM | POA: Diagnosis not present

## 2024-02-12 DIAGNOSIS — M810 Age-related osteoporosis without current pathological fracture: Secondary | ICD-10-CM | POA: Diagnosis not present

## 2024-02-12 DIAGNOSIS — M6282 Rhabdomyolysis: Secondary | ICD-10-CM | POA: Diagnosis not present

## 2024-02-12 DIAGNOSIS — E1140 Type 2 diabetes mellitus with diabetic neuropathy, unspecified: Secondary | ICD-10-CM | POA: Diagnosis not present

## 2024-02-12 DIAGNOSIS — E039 Hypothyroidism, unspecified: Secondary | ICD-10-CM | POA: Diagnosis not present

## 2024-02-12 DIAGNOSIS — E893 Postprocedural hypopituitarism: Secondary | ICD-10-CM | POA: Diagnosis not present

## 2024-02-12 DIAGNOSIS — J45909 Unspecified asthma, uncomplicated: Secondary | ICD-10-CM | POA: Diagnosis not present

## 2024-02-12 DIAGNOSIS — E785 Hyperlipidemia, unspecified: Secondary | ICD-10-CM | POA: Diagnosis not present

## 2024-02-12 DIAGNOSIS — G43909 Migraine, unspecified, not intractable, without status migrainosus: Secondary | ICD-10-CM | POA: Diagnosis not present

## 2024-02-12 DIAGNOSIS — I872 Venous insufficiency (chronic) (peripheral): Secondary | ICD-10-CM | POA: Diagnosis not present

## 2024-02-12 DIAGNOSIS — Z87440 Personal history of urinary (tract) infections: Secondary | ICD-10-CM | POA: Diagnosis not present

## 2024-02-12 DIAGNOSIS — I471 Supraventricular tachycardia, unspecified: Secondary | ICD-10-CM | POA: Diagnosis not present

## 2024-02-12 DIAGNOSIS — I10 Essential (primary) hypertension: Secondary | ICD-10-CM | POA: Diagnosis not present

## 2024-02-12 DIAGNOSIS — Z86018 Personal history of other benign neoplasm: Secondary | ICD-10-CM | POA: Diagnosis not present

## 2024-02-15 DIAGNOSIS — Z86018 Personal history of other benign neoplasm: Secondary | ICD-10-CM | POA: Diagnosis not present

## 2024-02-15 DIAGNOSIS — I872 Venous insufficiency (chronic) (peripheral): Secondary | ICD-10-CM | POA: Diagnosis not present

## 2024-02-15 DIAGNOSIS — Z87440 Personal history of urinary (tract) infections: Secondary | ICD-10-CM | POA: Diagnosis not present

## 2024-02-15 DIAGNOSIS — E785 Hyperlipidemia, unspecified: Secondary | ICD-10-CM | POA: Diagnosis not present

## 2024-02-15 DIAGNOSIS — M6282 Rhabdomyolysis: Secondary | ICD-10-CM | POA: Diagnosis not present

## 2024-02-15 DIAGNOSIS — G43909 Migraine, unspecified, not intractable, without status migrainosus: Secondary | ICD-10-CM | POA: Diagnosis not present

## 2024-02-15 DIAGNOSIS — M810 Age-related osteoporosis without current pathological fracture: Secondary | ICD-10-CM | POA: Diagnosis not present

## 2024-02-15 DIAGNOSIS — K219 Gastro-esophageal reflux disease without esophagitis: Secondary | ICD-10-CM | POA: Diagnosis not present

## 2024-02-15 DIAGNOSIS — E1140 Type 2 diabetes mellitus with diabetic neuropathy, unspecified: Secondary | ICD-10-CM | POA: Diagnosis not present

## 2024-02-15 DIAGNOSIS — E039 Hypothyroidism, unspecified: Secondary | ICD-10-CM | POA: Diagnosis not present

## 2024-02-15 DIAGNOSIS — J45909 Unspecified asthma, uncomplicated: Secondary | ICD-10-CM | POA: Diagnosis not present

## 2024-02-15 DIAGNOSIS — Z7984 Long term (current) use of oral hypoglycemic drugs: Secondary | ICD-10-CM | POA: Diagnosis not present

## 2024-02-15 DIAGNOSIS — I471 Supraventricular tachycardia, unspecified: Secondary | ICD-10-CM | POA: Diagnosis not present

## 2024-02-15 DIAGNOSIS — M1990 Unspecified osteoarthritis, unspecified site: Secondary | ICD-10-CM | POA: Diagnosis not present

## 2024-02-15 DIAGNOSIS — E893 Postprocedural hypopituitarism: Secondary | ICD-10-CM | POA: Diagnosis not present

## 2024-02-15 DIAGNOSIS — I10 Essential (primary) hypertension: Secondary | ICD-10-CM | POA: Diagnosis not present

## 2024-02-20 DIAGNOSIS — E1165 Type 2 diabetes mellitus with hyperglycemia: Secondary | ICD-10-CM | POA: Diagnosis not present

## 2024-02-20 DIAGNOSIS — I10 Essential (primary) hypertension: Secondary | ICD-10-CM | POA: Diagnosis not present

## 2024-02-20 DIAGNOSIS — E039 Hypothyroidism, unspecified: Secondary | ICD-10-CM | POA: Diagnosis not present

## 2024-02-20 DIAGNOSIS — M5412 Radiculopathy, cervical region: Secondary | ICD-10-CM | POA: Diagnosis not present

## 2024-02-20 DIAGNOSIS — N390 Urinary tract infection, site not specified: Secondary | ICD-10-CM | POA: Diagnosis not present

## 2024-02-22 DIAGNOSIS — I872 Venous insufficiency (chronic) (peripheral): Secondary | ICD-10-CM | POA: Diagnosis not present

## 2024-02-22 DIAGNOSIS — K219 Gastro-esophageal reflux disease without esophagitis: Secondary | ICD-10-CM | POA: Diagnosis not present

## 2024-02-22 DIAGNOSIS — M810 Age-related osteoporosis without current pathological fracture: Secondary | ICD-10-CM | POA: Diagnosis not present

## 2024-02-22 DIAGNOSIS — J45909 Unspecified asthma, uncomplicated: Secondary | ICD-10-CM | POA: Diagnosis not present

## 2024-02-22 DIAGNOSIS — Z7984 Long term (current) use of oral hypoglycemic drugs: Secondary | ICD-10-CM | POA: Diagnosis not present

## 2024-02-22 DIAGNOSIS — E1140 Type 2 diabetes mellitus with diabetic neuropathy, unspecified: Secondary | ICD-10-CM | POA: Diagnosis not present

## 2024-02-22 DIAGNOSIS — M6282 Rhabdomyolysis: Secondary | ICD-10-CM | POA: Diagnosis not present

## 2024-02-22 DIAGNOSIS — G43909 Migraine, unspecified, not intractable, without status migrainosus: Secondary | ICD-10-CM | POA: Diagnosis not present

## 2024-02-22 DIAGNOSIS — Z86018 Personal history of other benign neoplasm: Secondary | ICD-10-CM | POA: Diagnosis not present

## 2024-02-22 DIAGNOSIS — E893 Postprocedural hypopituitarism: Secondary | ICD-10-CM | POA: Diagnosis not present

## 2024-02-22 DIAGNOSIS — I10 Essential (primary) hypertension: Secondary | ICD-10-CM | POA: Diagnosis not present

## 2024-02-22 DIAGNOSIS — E039 Hypothyroidism, unspecified: Secondary | ICD-10-CM | POA: Diagnosis not present

## 2024-02-22 DIAGNOSIS — M1990 Unspecified osteoarthritis, unspecified site: Secondary | ICD-10-CM | POA: Diagnosis not present

## 2024-02-22 DIAGNOSIS — E785 Hyperlipidemia, unspecified: Secondary | ICD-10-CM | POA: Diagnosis not present

## 2024-02-22 DIAGNOSIS — I471 Supraventricular tachycardia, unspecified: Secondary | ICD-10-CM | POA: Diagnosis not present

## 2024-02-22 DIAGNOSIS — Z87440 Personal history of urinary (tract) infections: Secondary | ICD-10-CM | POA: Diagnosis not present

## 2024-02-23 DIAGNOSIS — G43909 Migraine, unspecified, not intractable, without status migrainosus: Secondary | ICD-10-CM | POA: Diagnosis not present

## 2024-02-23 DIAGNOSIS — J45909 Unspecified asthma, uncomplicated: Secondary | ICD-10-CM | POA: Diagnosis not present

## 2024-02-23 DIAGNOSIS — I872 Venous insufficiency (chronic) (peripheral): Secondary | ICD-10-CM | POA: Diagnosis not present

## 2024-02-23 DIAGNOSIS — E1140 Type 2 diabetes mellitus with diabetic neuropathy, unspecified: Secondary | ICD-10-CM | POA: Diagnosis not present

## 2024-02-23 DIAGNOSIS — Z7984 Long term (current) use of oral hypoglycemic drugs: Secondary | ICD-10-CM | POA: Diagnosis not present

## 2024-02-23 DIAGNOSIS — M6282 Rhabdomyolysis: Secondary | ICD-10-CM | POA: Diagnosis not present

## 2024-02-23 DIAGNOSIS — E893 Postprocedural hypopituitarism: Secondary | ICD-10-CM | POA: Diagnosis not present

## 2024-02-23 DIAGNOSIS — M1990 Unspecified osteoarthritis, unspecified site: Secondary | ICD-10-CM | POA: Diagnosis not present

## 2024-02-23 DIAGNOSIS — E039 Hypothyroidism, unspecified: Secondary | ICD-10-CM | POA: Diagnosis not present

## 2024-02-23 DIAGNOSIS — Z87440 Personal history of urinary (tract) infections: Secondary | ICD-10-CM | POA: Diagnosis not present

## 2024-02-23 DIAGNOSIS — I10 Essential (primary) hypertension: Secondary | ICD-10-CM | POA: Diagnosis not present

## 2024-02-23 DIAGNOSIS — I471 Supraventricular tachycardia, unspecified: Secondary | ICD-10-CM | POA: Diagnosis not present

## 2024-02-23 DIAGNOSIS — Z86018 Personal history of other benign neoplasm: Secondary | ICD-10-CM | POA: Diagnosis not present

## 2024-02-23 DIAGNOSIS — E785 Hyperlipidemia, unspecified: Secondary | ICD-10-CM | POA: Diagnosis not present

## 2024-02-23 DIAGNOSIS — K219 Gastro-esophageal reflux disease without esophagitis: Secondary | ICD-10-CM | POA: Diagnosis not present

## 2024-02-23 DIAGNOSIS — M810 Age-related osteoporosis without current pathological fracture: Secondary | ICD-10-CM | POA: Diagnosis not present

## 2024-02-26 DIAGNOSIS — E782 Mixed hyperlipidemia: Secondary | ICD-10-CM | POA: Diagnosis not present

## 2024-02-26 DIAGNOSIS — K219 Gastro-esophageal reflux disease without esophagitis: Secondary | ICD-10-CM | POA: Diagnosis not present

## 2024-02-26 DIAGNOSIS — E039 Hypothyroidism, unspecified: Secondary | ICD-10-CM | POA: Diagnosis not present

## 2024-02-26 DIAGNOSIS — R809 Proteinuria, unspecified: Secondary | ICD-10-CM | POA: Diagnosis not present

## 2024-02-26 DIAGNOSIS — E1165 Type 2 diabetes mellitus with hyperglycemia: Secondary | ICD-10-CM | POA: Diagnosis not present

## 2024-02-26 DIAGNOSIS — I700 Atherosclerosis of aorta: Secondary | ICD-10-CM | POA: Diagnosis not present

## 2024-02-26 DIAGNOSIS — J4540 Moderate persistent asthma, uncomplicated: Secondary | ICD-10-CM | POA: Diagnosis not present

## 2024-02-26 DIAGNOSIS — I10 Essential (primary) hypertension: Secondary | ICD-10-CM | POA: Diagnosis not present

## 2024-02-26 DIAGNOSIS — D352 Benign neoplasm of pituitary gland: Secondary | ICD-10-CM | POA: Diagnosis not present

## 2024-02-26 DIAGNOSIS — N390 Urinary tract infection, site not specified: Secondary | ICD-10-CM | POA: Diagnosis not present

## 2024-02-27 DIAGNOSIS — Z86018 Personal history of other benign neoplasm: Secondary | ICD-10-CM | POA: Diagnosis not present

## 2024-02-27 DIAGNOSIS — M6282 Rhabdomyolysis: Secondary | ICD-10-CM | POA: Diagnosis not present

## 2024-02-27 DIAGNOSIS — K219 Gastro-esophageal reflux disease without esophagitis: Secondary | ICD-10-CM | POA: Diagnosis not present

## 2024-02-27 DIAGNOSIS — M810 Age-related osteoporosis without current pathological fracture: Secondary | ICD-10-CM | POA: Diagnosis not present

## 2024-02-27 DIAGNOSIS — Z87440 Personal history of urinary (tract) infections: Secondary | ICD-10-CM | POA: Diagnosis not present

## 2024-02-27 DIAGNOSIS — I872 Venous insufficiency (chronic) (peripheral): Secondary | ICD-10-CM | POA: Diagnosis not present

## 2024-02-27 DIAGNOSIS — I10 Essential (primary) hypertension: Secondary | ICD-10-CM | POA: Diagnosis not present

## 2024-02-27 DIAGNOSIS — E893 Postprocedural hypopituitarism: Secondary | ICD-10-CM | POA: Diagnosis not present

## 2024-02-27 DIAGNOSIS — M1990 Unspecified osteoarthritis, unspecified site: Secondary | ICD-10-CM | POA: Diagnosis not present

## 2024-02-27 DIAGNOSIS — J45909 Unspecified asthma, uncomplicated: Secondary | ICD-10-CM | POA: Diagnosis not present

## 2024-02-27 DIAGNOSIS — I471 Supraventricular tachycardia, unspecified: Secondary | ICD-10-CM | POA: Diagnosis not present

## 2024-02-27 DIAGNOSIS — E039 Hypothyroidism, unspecified: Secondary | ICD-10-CM | POA: Diagnosis not present

## 2024-02-27 DIAGNOSIS — G43909 Migraine, unspecified, not intractable, without status migrainosus: Secondary | ICD-10-CM | POA: Diagnosis not present

## 2024-02-27 DIAGNOSIS — E1140 Type 2 diabetes mellitus with diabetic neuropathy, unspecified: Secondary | ICD-10-CM | POA: Diagnosis not present

## 2024-02-27 DIAGNOSIS — Z7984 Long term (current) use of oral hypoglycemic drugs: Secondary | ICD-10-CM | POA: Diagnosis not present

## 2024-02-27 DIAGNOSIS — E785 Hyperlipidemia, unspecified: Secondary | ICD-10-CM | POA: Diagnosis not present

## 2024-02-28 ENCOUNTER — Other Ambulatory Visit (HOSPITAL_COMMUNITY): Payer: Self-pay | Admitting: Nurse Practitioner

## 2024-02-28 DIAGNOSIS — E039 Hypothyroidism, unspecified: Secondary | ICD-10-CM | POA: Diagnosis not present

## 2024-02-28 DIAGNOSIS — J45909 Unspecified asthma, uncomplicated: Secondary | ICD-10-CM | POA: Diagnosis not present

## 2024-02-28 DIAGNOSIS — G43909 Migraine, unspecified, not intractable, without status migrainosus: Secondary | ICD-10-CM | POA: Diagnosis not present

## 2024-02-28 DIAGNOSIS — Z87440 Personal history of urinary (tract) infections: Secondary | ICD-10-CM | POA: Diagnosis not present

## 2024-02-28 DIAGNOSIS — Z86018 Personal history of other benign neoplasm: Secondary | ICD-10-CM | POA: Diagnosis not present

## 2024-02-28 DIAGNOSIS — Z7984 Long term (current) use of oral hypoglycemic drugs: Secondary | ICD-10-CM | POA: Diagnosis not present

## 2024-02-28 DIAGNOSIS — I10 Essential (primary) hypertension: Secondary | ICD-10-CM | POA: Diagnosis not present

## 2024-02-28 DIAGNOSIS — M1990 Unspecified osteoarthritis, unspecified site: Secondary | ICD-10-CM | POA: Diagnosis not present

## 2024-02-28 DIAGNOSIS — E893 Postprocedural hypopituitarism: Secondary | ICD-10-CM | POA: Diagnosis not present

## 2024-02-28 DIAGNOSIS — M810 Age-related osteoporosis without current pathological fracture: Secondary | ICD-10-CM | POA: Diagnosis not present

## 2024-02-28 DIAGNOSIS — I872 Venous insufficiency (chronic) (peripheral): Secondary | ICD-10-CM | POA: Diagnosis not present

## 2024-02-28 DIAGNOSIS — E1140 Type 2 diabetes mellitus with diabetic neuropathy, unspecified: Secondary | ICD-10-CM | POA: Diagnosis not present

## 2024-02-28 DIAGNOSIS — Z13820 Encounter for screening for osteoporosis: Secondary | ICD-10-CM

## 2024-02-28 DIAGNOSIS — K219 Gastro-esophageal reflux disease without esophagitis: Secondary | ICD-10-CM | POA: Diagnosis not present

## 2024-02-28 DIAGNOSIS — I471 Supraventricular tachycardia, unspecified: Secondary | ICD-10-CM | POA: Diagnosis not present

## 2024-02-28 DIAGNOSIS — E785 Hyperlipidemia, unspecified: Secondary | ICD-10-CM | POA: Diagnosis not present

## 2024-02-28 DIAGNOSIS — M6282 Rhabdomyolysis: Secondary | ICD-10-CM | POA: Diagnosis not present

## 2024-03-05 DIAGNOSIS — Z86018 Personal history of other benign neoplasm: Secondary | ICD-10-CM | POA: Diagnosis not present

## 2024-03-05 DIAGNOSIS — E893 Postprocedural hypopituitarism: Secondary | ICD-10-CM | POA: Diagnosis not present

## 2024-03-05 DIAGNOSIS — I10 Essential (primary) hypertension: Secondary | ICD-10-CM | POA: Diagnosis not present

## 2024-03-05 DIAGNOSIS — M6282 Rhabdomyolysis: Secondary | ICD-10-CM | POA: Diagnosis not present

## 2024-03-05 DIAGNOSIS — Z7984 Long term (current) use of oral hypoglycemic drugs: Secondary | ICD-10-CM | POA: Diagnosis not present

## 2024-03-05 DIAGNOSIS — M1990 Unspecified osteoarthritis, unspecified site: Secondary | ICD-10-CM | POA: Diagnosis not present

## 2024-03-05 DIAGNOSIS — E039 Hypothyroidism, unspecified: Secondary | ICD-10-CM | POA: Diagnosis not present

## 2024-03-05 DIAGNOSIS — I471 Supraventricular tachycardia, unspecified: Secondary | ICD-10-CM | POA: Diagnosis not present

## 2024-03-05 DIAGNOSIS — K219 Gastro-esophageal reflux disease without esophagitis: Secondary | ICD-10-CM | POA: Diagnosis not present

## 2024-03-05 DIAGNOSIS — I872 Venous insufficiency (chronic) (peripheral): Secondary | ICD-10-CM | POA: Diagnosis not present

## 2024-03-05 DIAGNOSIS — E785 Hyperlipidemia, unspecified: Secondary | ICD-10-CM | POA: Diagnosis not present

## 2024-03-05 DIAGNOSIS — E1140 Type 2 diabetes mellitus with diabetic neuropathy, unspecified: Secondary | ICD-10-CM | POA: Diagnosis not present

## 2024-03-05 DIAGNOSIS — J45909 Unspecified asthma, uncomplicated: Secondary | ICD-10-CM | POA: Diagnosis not present

## 2024-03-05 DIAGNOSIS — G43909 Migraine, unspecified, not intractable, without status migrainosus: Secondary | ICD-10-CM | POA: Diagnosis not present

## 2024-03-05 DIAGNOSIS — Z87440 Personal history of urinary (tract) infections: Secondary | ICD-10-CM | POA: Diagnosis not present

## 2024-03-05 DIAGNOSIS — M810 Age-related osteoporosis without current pathological fracture: Secondary | ICD-10-CM | POA: Diagnosis not present

## 2024-03-06 DIAGNOSIS — Z7984 Long term (current) use of oral hypoglycemic drugs: Secondary | ICD-10-CM | POA: Diagnosis not present

## 2024-03-06 DIAGNOSIS — K219 Gastro-esophageal reflux disease without esophagitis: Secondary | ICD-10-CM | POA: Diagnosis not present

## 2024-03-06 DIAGNOSIS — E785 Hyperlipidemia, unspecified: Secondary | ICD-10-CM | POA: Diagnosis not present

## 2024-03-06 DIAGNOSIS — E039 Hypothyroidism, unspecified: Secondary | ICD-10-CM | POA: Diagnosis not present

## 2024-03-06 DIAGNOSIS — E1140 Type 2 diabetes mellitus with diabetic neuropathy, unspecified: Secondary | ICD-10-CM | POA: Diagnosis not present

## 2024-03-06 DIAGNOSIS — M810 Age-related osteoporosis without current pathological fracture: Secondary | ICD-10-CM | POA: Diagnosis not present

## 2024-03-06 DIAGNOSIS — I471 Supraventricular tachycardia, unspecified: Secondary | ICD-10-CM | POA: Diagnosis not present

## 2024-03-06 DIAGNOSIS — I872 Venous insufficiency (chronic) (peripheral): Secondary | ICD-10-CM | POA: Diagnosis not present

## 2024-03-06 DIAGNOSIS — I10 Essential (primary) hypertension: Secondary | ICD-10-CM | POA: Diagnosis not present

## 2024-03-06 DIAGNOSIS — E893 Postprocedural hypopituitarism: Secondary | ICD-10-CM | POA: Diagnosis not present

## 2024-03-06 DIAGNOSIS — M6282 Rhabdomyolysis: Secondary | ICD-10-CM | POA: Diagnosis not present

## 2024-03-06 DIAGNOSIS — G43909 Migraine, unspecified, not intractable, without status migrainosus: Secondary | ICD-10-CM | POA: Diagnosis not present

## 2024-03-06 DIAGNOSIS — M1990 Unspecified osteoarthritis, unspecified site: Secondary | ICD-10-CM | POA: Diagnosis not present

## 2024-03-06 DIAGNOSIS — Z86018 Personal history of other benign neoplasm: Secondary | ICD-10-CM | POA: Diagnosis not present

## 2024-03-06 DIAGNOSIS — Z87440 Personal history of urinary (tract) infections: Secondary | ICD-10-CM | POA: Diagnosis not present

## 2024-03-06 DIAGNOSIS — J45909 Unspecified asthma, uncomplicated: Secondary | ICD-10-CM | POA: Diagnosis not present

## 2024-03-11 ENCOUNTER — Inpatient Hospital Stay (HOSPITAL_COMMUNITY)

## 2024-03-11 ENCOUNTER — Emergency Department (HOSPITAL_COMMUNITY)

## 2024-03-11 ENCOUNTER — Encounter (HOSPITAL_COMMUNITY): Payer: Self-pay

## 2024-03-11 ENCOUNTER — Inpatient Hospital Stay (HOSPITAL_COMMUNITY)
Admission: EM | Admit: 2024-03-11 | Discharge: 2024-03-19 | DRG: 064 | Disposition: A | Discharge status: Skilled Nursing Facility | Attending: Family Medicine | Admitting: Family Medicine

## 2024-03-11 ENCOUNTER — Other Ambulatory Visit: Payer: Self-pay

## 2024-03-11 DIAGNOSIS — K219 Gastro-esophageal reflux disease without esophagitis: Secondary | ICD-10-CM | POA: Diagnosis not present

## 2024-03-11 DIAGNOSIS — Z888 Allergy status to other drugs, medicaments and biological substances status: Secondary | ICD-10-CM

## 2024-03-11 DIAGNOSIS — R6889 Other general symptoms and signs: Secondary | ICD-10-CM | POA: Diagnosis not present

## 2024-03-11 DIAGNOSIS — Z515 Encounter for palliative care: Secondary | ICD-10-CM | POA: Diagnosis not present

## 2024-03-11 DIAGNOSIS — Z7984 Long term (current) use of oral hypoglycemic drugs: Secondary | ICD-10-CM

## 2024-03-11 DIAGNOSIS — E039 Hypothyroidism, unspecified: Secondary | ICD-10-CM | POA: Diagnosis present

## 2024-03-11 DIAGNOSIS — Z7189 Other specified counseling: Secondary | ICD-10-CM | POA: Diagnosis not present

## 2024-03-11 DIAGNOSIS — M810 Age-related osteoporosis without current pathological fracture: Secondary | ICD-10-CM | POA: Diagnosis not present

## 2024-03-11 DIAGNOSIS — R29700 NIHSS score 0: Secondary | ICD-10-CM | POA: Diagnosis not present

## 2024-03-11 DIAGNOSIS — F32A Depression, unspecified: Secondary | ICD-10-CM | POA: Diagnosis present

## 2024-03-11 DIAGNOSIS — Z8249 Family history of ischemic heart disease and other diseases of the circulatory system: Secondary | ICD-10-CM

## 2024-03-11 DIAGNOSIS — G9341 Metabolic encephalopathy: Secondary | ICD-10-CM | POA: Diagnosis not present

## 2024-03-11 DIAGNOSIS — Z883 Allergy status to other anti-infective agents status: Secondary | ICD-10-CM

## 2024-03-11 DIAGNOSIS — I639 Cerebral infarction, unspecified: Principal | ICD-10-CM

## 2024-03-11 DIAGNOSIS — Z7902 Long term (current) use of antithrombotics/antiplatelets: Secondary | ICD-10-CM | POA: Diagnosis not present

## 2024-03-11 DIAGNOSIS — J99 Respiratory disorders in diseases classified elsewhere: Secondary | ICD-10-CM | POA: Diagnosis not present

## 2024-03-11 DIAGNOSIS — Z0389 Encounter for observation for other suspected diseases and conditions ruled out: Secondary | ICD-10-CM | POA: Diagnosis not present

## 2024-03-11 DIAGNOSIS — E7800 Pure hypercholesterolemia, unspecified: Secondary | ICD-10-CM | POA: Diagnosis not present

## 2024-03-11 DIAGNOSIS — I6389 Other cerebral infarction: Secondary | ICD-10-CM | POA: Diagnosis not present

## 2024-03-11 DIAGNOSIS — G43909 Migraine, unspecified, not intractable, without status migrainosus: Secondary | ICD-10-CM | POA: Diagnosis not present

## 2024-03-11 DIAGNOSIS — Z66 Do not resuscitate: Secondary | ICD-10-CM | POA: Diagnosis not present

## 2024-03-11 DIAGNOSIS — E119 Type 2 diabetes mellitus without complications: Secondary | ICD-10-CM | POA: Diagnosis not present

## 2024-03-11 DIAGNOSIS — Z9049 Acquired absence of other specified parts of digestive tract: Secondary | ICD-10-CM

## 2024-03-11 DIAGNOSIS — D519 Vitamin B12 deficiency anemia, unspecified: Secondary | ICD-10-CM | POA: Diagnosis not present

## 2024-03-11 DIAGNOSIS — Z8673 Personal history of transient ischemic attack (TIA), and cerebral infarction without residual deficits: Secondary | ICD-10-CM

## 2024-03-11 DIAGNOSIS — F802 Mixed receptive-expressive language disorder: Secondary | ICD-10-CM | POA: Diagnosis not present

## 2024-03-11 DIAGNOSIS — J45909 Unspecified asthma, uncomplicated: Secondary | ICD-10-CM | POA: Diagnosis present

## 2024-03-11 DIAGNOSIS — Z880 Allergy status to penicillin: Secondary | ICD-10-CM | POA: Diagnosis not present

## 2024-03-11 DIAGNOSIS — Z823 Family history of stroke: Secondary | ICD-10-CM

## 2024-03-11 DIAGNOSIS — Z7982 Long term (current) use of aspirin: Secondary | ICD-10-CM | POA: Diagnosis not present

## 2024-03-11 DIAGNOSIS — M6281 Muscle weakness (generalized): Secondary | ICD-10-CM | POA: Diagnosis not present

## 2024-03-11 DIAGNOSIS — Z8672 Personal history of thrombophlebitis: Secondary | ICD-10-CM

## 2024-03-11 DIAGNOSIS — I10 Essential (primary) hypertension: Secondary | ICD-10-CM | POA: Diagnosis not present

## 2024-03-11 DIAGNOSIS — E538 Deficiency of other specified B group vitamins: Secondary | ICD-10-CM | POA: Diagnosis present

## 2024-03-11 DIAGNOSIS — E1142 Type 2 diabetes mellitus with diabetic polyneuropathy: Secondary | ICD-10-CM | POA: Diagnosis not present

## 2024-03-11 DIAGNOSIS — G319 Degenerative disease of nervous system, unspecified: Secondary | ICD-10-CM | POA: Diagnosis not present

## 2024-03-11 DIAGNOSIS — R29711 NIHSS score 11: Secondary | ICD-10-CM | POA: Diagnosis not present

## 2024-03-11 DIAGNOSIS — I6782 Cerebral ischemia: Secondary | ICD-10-CM | POA: Diagnosis not present

## 2024-03-11 DIAGNOSIS — Z87440 Personal history of urinary (tract) infections: Secondary | ICD-10-CM

## 2024-03-11 DIAGNOSIS — M6282 Rhabdomyolysis: Secondary | ICD-10-CM | POA: Diagnosis not present

## 2024-03-11 DIAGNOSIS — Z825 Family history of asthma and other chronic lower respiratory diseases: Secondary | ICD-10-CM

## 2024-03-11 DIAGNOSIS — E441 Mild protein-calorie malnutrition: Secondary | ICD-10-CM | POA: Diagnosis not present

## 2024-03-11 DIAGNOSIS — Z794 Long term (current) use of insulin: Secondary | ICD-10-CM | POA: Diagnosis not present

## 2024-03-11 DIAGNOSIS — G9340 Encephalopathy, unspecified: Secondary | ICD-10-CM | POA: Diagnosis present

## 2024-03-11 DIAGNOSIS — R54 Age-related physical debility: Secondary | ICD-10-CM | POA: Diagnosis present

## 2024-03-11 DIAGNOSIS — R1312 Dysphagia, oropharyngeal phase: Secondary | ICD-10-CM | POA: Diagnosis not present

## 2024-03-11 DIAGNOSIS — N390 Urinary tract infection, site not specified: Secondary | ICD-10-CM

## 2024-03-11 DIAGNOSIS — R4701 Aphasia: Secondary | ICD-10-CM | POA: Diagnosis not present

## 2024-03-11 DIAGNOSIS — G939 Disorder of brain, unspecified: Secondary | ICD-10-CM | POA: Diagnosis not present

## 2024-03-11 DIAGNOSIS — F419 Anxiety disorder, unspecified: Secondary | ICD-10-CM | POA: Insufficient documentation

## 2024-03-11 DIAGNOSIS — Z90710 Acquired absence of both cervix and uterus: Secondary | ICD-10-CM

## 2024-03-11 DIAGNOSIS — R569 Unspecified convulsions: Secondary | ICD-10-CM | POA: Diagnosis not present

## 2024-03-11 DIAGNOSIS — Z9841 Cataract extraction status, right eye: Secondary | ICD-10-CM

## 2024-03-11 DIAGNOSIS — N3000 Acute cystitis without hematuria: Principal | ICD-10-CM | POA: Diagnosis present

## 2024-03-11 DIAGNOSIS — Z79899 Other long term (current) drug therapy: Secondary | ICD-10-CM

## 2024-03-11 DIAGNOSIS — Z7401 Bed confinement status: Secondary | ICD-10-CM | POA: Diagnosis not present

## 2024-03-11 LAB — URINALYSIS, W/ REFLEX TO CULTURE (INFECTION SUSPECTED)
Bilirubin Urine: NEGATIVE
Glucose, UA: NEGATIVE mg/dL
Ketones, ur: NEGATIVE mg/dL
Nitrite: NEGATIVE
Protein, ur: 30 mg/dL — AB
Specific Gravity, Urine: 1.029 (ref 1.005–1.030)
WBC, UA: >50 WBC/hpf (ref 0–5)
pH: 5 (ref 5.0–8.0)

## 2024-03-11 LAB — PROTIME-INR
INR: 1 (ref 0.8–1.2)
Prothrombin Time: 13.5 s (ref 11.4–15.2)

## 2024-03-11 LAB — CBC WITH DIFFERENTIAL/PLATELET
Abs Immature Granulocytes: 0.01 10*3/uL (ref 0.00–0.07)
Basophils Absolute: 0.1 10*3/uL (ref 0.0–0.1)
Basophils Relative: 1 %
Eosinophils Absolute: 0.4 10*3/uL (ref 0.0–0.5)
Eosinophils Relative: 5 %
HCT: 40 % (ref 36.0–46.0)
Hemoglobin: 13.1 g/dL (ref 12.0–15.0)
Immature Granulocytes: 0 %
Lymphocytes Relative: 36 %
Lymphs Abs: 2.5 10*3/uL (ref 0.7–4.0)
MCH: 29.6 pg (ref 26.0–34.0)
MCHC: 32.8 g/dL (ref 30.0–36.0)
MCV: 90.3 fL (ref 80.0–100.0)
Monocytes Absolute: 0.6 10*3/uL (ref 0.1–1.0)
Monocytes Relative: 8 %
Neutro Abs: 3.4 10*3/uL (ref 1.7–7.7)
Neutrophils Relative %: 50 %
Platelets: 267 10*3/uL (ref 150–400)
RBC: 4.43 MIL/uL (ref 3.87–5.11)
RDW: 15.6 % — ABNORMAL HIGH (ref 11.5–15.5)
WBC: 7 10*3/uL (ref 4.0–10.5)
nRBC: 0 % (ref 0.0–0.2)

## 2024-03-11 LAB — COMPREHENSIVE METABOLIC PANEL
ALT: 15 U/L (ref 0–44)
AST: 17 U/L (ref 15–41)
Albumin: 3.6 g/dL (ref 3.5–5.0)
Alkaline Phosphatase: 34 U/L — ABNORMAL LOW (ref 38–126)
Anion gap: 9 (ref 5–15)
BUN: 9 mg/dL (ref 8–23)
CO2: 26 mmol/L (ref 22–32)
Calcium: 8.8 mg/dL — ABNORMAL LOW (ref 8.9–10.3)
Chloride: 104 mmol/L (ref 98–111)
Creatinine, Ser: 0.56 mg/dL (ref 0.44–1.00)
GFR, Estimated: >60 mL/min (ref >60)
Glucose, Bld: 78 mg/dL (ref 70–99)
Potassium: 3.9 mmol/L (ref 3.5–5.1)
Sodium: 139 mmol/L (ref 135–145)
Total Bilirubin: 0.6 mg/dL (ref 0.0–1.2)
Total Protein: 6.8 g/dL (ref 6.5–8.1)

## 2024-03-11 LAB — MAGNESIUM: Magnesium: 1.4 mg/dL — ABNORMAL LOW (ref 1.7–2.4)

## 2024-03-11 LAB — LACTIC ACID, PLASMA: Lactic Acid, Venous: 1.1 mmol/L (ref 0.5–1.9)

## 2024-03-11 MED ORDER — GADOBUTROL 1 MMOL/ML IV SOLN
6.2000 mL | Freq: Once | INTRAVENOUS | Status: AC | PRN
  Administered 2024-03-11: 6.2 mL via INTRAVENOUS

## 2024-03-11 MED ORDER — PANTOPRAZOLE SODIUM 40 MG PO TBEC
40.0000 mg | DELAYED_RELEASE_TABLET | Freq: Every day | ORAL | Status: DC
  Administered 2024-03-12 – 2024-03-19 (×6): 40 mg via ORAL
  Filled 2024-03-11 (×7): qty 1

## 2024-03-11 MED ORDER — ONDANSETRON HCL 4 MG/2ML IJ SOLN
4.0000 mg | INTRAMUSCULAR | Status: DC | PRN
  Administered 2024-03-16 – 2024-03-17 (×2): 4 mg via INTRAVENOUS
  Filled 2024-03-11 (×2): qty 2

## 2024-03-11 MED ORDER — BACLOFEN 10 MG PO TABS
10.0000 mg | ORAL_TABLET | Freq: Three times a day (TID) | ORAL | Status: DC
  Administered 2024-03-11 – 2024-03-15 (×10): 10 mg via ORAL
  Filled 2024-03-11 (×12): qty 1

## 2024-03-11 MED ORDER — LEVOFLOXACIN IN D5W 750 MG/150ML IV SOLN
750.0000 mg | Freq: Once | INTRAVENOUS | Status: AC
  Administered 2024-03-11: 750 mg via INTRAVENOUS
  Filled 2024-03-11: qty 150

## 2024-03-11 MED ORDER — FUROSEMIDE 20 MG PO TABS: 20.0000 mg | ORAL_TABLET | Freq: Every day | ORAL | Status: DC | PRN

## 2024-03-11 MED ORDER — TRAZODONE HCL 50 MG PO TABS
25.0000 mg | ORAL_TABLET | Freq: Every evening | ORAL | Status: DC | PRN
  Administered 2024-03-12: 25 mg via ORAL
  Filled 2024-03-11 (×2): qty 1

## 2024-03-11 MED ORDER — DOCUSATE SODIUM 100 MG PO CAPS
100.0000 mg | ORAL_CAPSULE | Freq: Two times a day (BID) | ORAL | Status: DC
  Administered 2024-03-12 – 2024-03-19 (×8): 100 mg via ORAL
  Filled 2024-03-11 (×13): qty 1

## 2024-03-11 MED ORDER — MIRABEGRON ER 25 MG PO TB24
25.0000 mg | ORAL_TABLET | Freq: Every day | ORAL | Status: DC
  Administered 2024-03-12 – 2024-03-19 (×6): 25 mg via ORAL
  Filled 2024-03-11 (×7): qty 1

## 2024-03-11 MED ORDER — ALPRAZOLAM 0.25 MG PO TABS
0.2500 mg | ORAL_TABLET | Freq: Every day | ORAL | Status: DC | PRN
  Administered 2024-03-12: 0.25 mg via ORAL
  Filled 2024-03-11: qty 1

## 2024-03-11 MED ORDER — ACETAMINOPHEN 160 MG/5ML PO SOLN: 650.0000 mg | ORAL | Status: DC | PRN

## 2024-03-11 MED ORDER — ENOXAPARIN SODIUM 40 MG/0.4ML IJ SOSY
40.0000 mg | PREFILLED_SYRINGE | INTRAMUSCULAR | Status: DC
  Administered 2024-03-12 – 2024-03-19 (×8): 40 mg via SUBCUTANEOUS
  Filled 2024-03-11 (×8): qty 0.4

## 2024-03-11 MED ORDER — CYCLOSPORINE 0.05 % OP EMUL
1.0000 [drp] | Freq: Two times a day (BID) | OPHTHALMIC | Status: DC
  Administered 2024-03-12 – 2024-03-19 (×14): 1 [drp] via OPHTHALMIC
  Filled 2024-03-11 (×14): qty 30

## 2024-03-11 MED ORDER — ASPIRIN 81 MG PO CHEW
324.0000 mg | CHEWABLE_TABLET | Freq: Once | ORAL | Status: AC
  Administered 2024-03-11: 324 mg via ORAL
  Filled 2024-03-11: qty 4

## 2024-03-11 MED ORDER — ACETAMINOPHEN 650 MG RE SUPP
650.0000 mg | RECTAL | Status: DC | PRN
  Administered 2024-03-17: 650 mg via RECTAL
  Filled 2024-03-11: qty 1

## 2024-03-11 MED ORDER — DAPAGLIFLOZIN PROPANEDIOL 10 MG PO TABS
10.0000 mg | ORAL_TABLET | Freq: Every day | ORAL | Status: DC
  Filled 2024-03-11 (×2): qty 1

## 2024-03-11 MED ORDER — ADULT MULTIVITAMIN W/MINERALS CH
1.0000 | ORAL_TABLET | Freq: Every day | ORAL | Status: DC
  Administered 2024-03-12 – 2024-03-19 (×7): 1 via ORAL
  Filled 2024-03-11 (×8): qty 1

## 2024-03-11 MED ORDER — IOHEXOL 350 MG/ML SOLN: 75.0000 mL | Freq: Once | INTRAVENOUS | Status: DC | PRN

## 2024-03-11 MED ORDER — CARVEDILOL 3.125 MG PO TABS
3.1250 mg | ORAL_TABLET | Freq: Two times a day (BID) | ORAL | Status: DC
  Administered 2024-03-12 – 2024-03-19 (×10): 3.125 mg via ORAL
  Filled 2024-03-11 (×13): qty 1

## 2024-03-11 MED ORDER — ALBUTEROL SULFATE (2.5 MG/3ML) 0.083% IN NEBU
3.0000 mL | INHALATION_SOLUTION | RESPIRATORY_TRACT | Status: DC | PRN
  Administered 2024-03-13: 3 mL via RESPIRATORY_TRACT
  Filled 2024-03-11: qty 3

## 2024-03-11 MED ORDER — SENNOSIDES-DOCUSATE SODIUM 8.6-50 MG PO TABS: 1.0000 | ORAL_TABLET | Freq: Every evening | ORAL | Status: DC | PRN

## 2024-03-11 MED ORDER — VORTIOXETINE HBR 10 MG PO TABS
10.0000 mg | ORAL_TABLET | Freq: Every day | ORAL | Status: DC
  Administered 2024-03-12: 10 mg via ORAL
  Filled 2024-03-11 (×4): qty 1

## 2024-03-11 MED ORDER — STROKE: EARLY STAGES OF RECOVERY BOOK
Freq: Once | Status: AC
  Filled 2024-03-11: qty 1

## 2024-03-11 MED ORDER — PREGABALIN 75 MG PO CAPS
150.0000 mg | ORAL_CAPSULE | ORAL | Status: AC
  Administered 2024-03-11: 150 mg via ORAL
  Filled 2024-03-11: qty 2

## 2024-03-11 MED ORDER — ACETAMINOPHEN 325 MG PO TABS
650.0000 mg | ORAL_TABLET | ORAL | Status: DC | PRN
  Administered 2024-03-17 – 2024-03-18 (×2): 650 mg via ORAL
  Filled 2024-03-11 (×2): qty 2

## 2024-03-11 MED ORDER — LEVOTHYROXINE SODIUM 50 MCG PO TABS
50.0000 ug | ORAL_TABLET | Freq: Every day | ORAL | Status: DC
  Administered 2024-03-12 – 2024-03-18 (×5): 50 ug via ORAL
  Filled 2024-03-11 (×6): qty 1

## 2024-03-11 MED ORDER — SODIUM CHLORIDE 0.9 % IV SOLN: INTRAVENOUS | Status: AC

## 2024-03-11 MED ORDER — ACETAMINOPHEN 500 MG PO TABS
1000.0000 mg | ORAL_TABLET | Freq: Once | ORAL | Status: AC
  Administered 2024-03-11: 1000 mg via ORAL
  Filled 2024-03-11: qty 2

## 2024-03-11 MED ORDER — ASPIRIN 81 MG PO CHEW
81.0000 mg | CHEWABLE_TABLET | Freq: Every day | ORAL | Status: DC
  Administered 2024-03-12 – 2024-03-15 (×4): 81 mg via ORAL
  Filled 2024-03-11 (×5): qty 1

## 2024-03-11 MED ORDER — VENLAFAXINE HCL ER 75 MG PO CP24
150.0000 mg | ORAL_CAPSULE | ORAL | Status: DC
  Administered 2024-03-12 – 2024-03-19 (×6): 150 mg via ORAL
  Filled 2024-03-11 (×5): qty 2
  Filled 2024-03-11: qty 4
  Filled 2024-03-11: qty 2

## 2024-03-11 NOTE — ED Triage Notes (Signed)
 Pt arrived via POV from home with family who report Pt has experienced 5 UTI's this year and just recently finished her last round of antibiotics. Pt began displaying signs of confusion again this past Saturday.

## 2024-03-11 NOTE — ED Notes (Signed)
 Patient transported to MRI

## 2024-03-11 NOTE — ED Provider Notes (Signed)
 [place] EMERGENCY DEPARTMENT AT [facility] Provider Note   CSN: [id number] Arrival date & time: 03/11/24  1416     History  Chief Complaint  Patient presents with   Altered Mental Status    [name] White is a 83 y.o. female.   Altered Mental Status  This patient is an 83 year old female, who present to the hospital today with a complaint of altered mental status.  The history is provided primarily by the patient's sister who states that she has had some confusion over the last couple of days, this confusion is described as difficulty speaking and getting her words out, she does not seem to be bothered otherwise and has not had any fevers chills nausea vomiting or diarrhea, no coughing or shortness of breath, no difficulty using arms or legs, she is ambulatory at her baseline level of weakness, she lives by herself and has a daughter that checks on her every day to help her with medications.  Reportedly this patient has had approximately 5 urinary tract infections over the last 6 months according to the sister , and my review of the medical record shows that the last time that the patient was admitted was in January of this year approximately 2 months ago, during that time the patient was admitted for severe sepsis with septic shock secondary to urinary tract infection, she had a metabolic encephalopathy at that time.  New  She did have thyroid testingDuring that admit, and it showed that her TSH was normal  Home Medications Prior to Admission medications   Medication Sig Start Date End Date Taking? Authorizing Provider  baclofen (LIORESAL) 10 MG tablet Take 10 mg by mouth daily. 02/14/24  Yes [provider]  TRINTELLIX 10 MG TABS tablet Take 10 mg by mouth daily. 03/09/24  Yes [provider]  ACCU-CHEK AVIVA PLUS test strip TEST TWICE DAILY. 01/14/20   Wandra Feinstein, MD  Accu-Chek Softclix Lancets lancets SMARTSIG:Topical 07/12/23   [provider]  acetaminophen (TYLENOL) 325 MG tablet Take 2 tablets (650 mg total) by mouth every 6 (six) hours as needed for mild pain (pain score 1-3) (or Fever >/= 101). 01/01/24   Shahmehdi, Gemma Payor, MD  albuterol (PROVENTIL HFA;VENTOLIN HFA) 108 (90 BASE) MCG/ACT inhaler Inhale 2 puffs into the lungs every 4 (four) hours as needed for wheezing or shortness of breath.    [provider]  ALPRAZolam Prudy Feeler) 0.25 MG tablet Take 1 tablet (0.25 mg total) by mouth daily as needed. 01/02/24   Shahmehdi, Gemma Payor, MD  aspirin 81 MG tablet Take 81 mg by mouth daily.    [provider]  Blood Glucose Monitoring Suppl (ACCU-CHEK GUIDE ME) w/Device KIT  07/12/23   [provider]  BREO ELLIPTA 200-25 MCG/INH AEPB Inhale 1 puff into the lungs daily.  Patient not taking: Reported on 12/26/2023 09/08/18   [provider]  carvedilol (COREG) 3.125 MG tablet TAKE ONE TABLET BY MOUTH TWICE A DAY 09/25/23   Antoine Poche, MD  cycloSPORINE (RESTASIS) 0.05 % ophthalmic emulsion Place 1 drop into both eyes 2 (two) times daily.    [provider]  dapagliflozin propanediol (FARXIGA) 5 MG TABS tablet Take 2.5 mg by mouth daily before breakfast. Patient taking differently: Take 10 mg by mouth daily before breakfast. 12/26/19   Romero Belling, MD  docusate sodium (COLACE) 100 MG capsule Take 1 capsule (100 mg total) by mouth 2 (two) times daily. 06/06/20   Hoyt Koch  G, MD  estradiol (ESTRACE) 0.1 MG/GM vaginal cream Place 1 Applicatorful vaginally at bedtime. 02/28/24   [provider]  furosemide (LASIX) 20 MG tablet May take Lasix 20 mg as NEEDED for leg swelling 03/13/15   Antoine Poche, MD  levothyroxine (SYNTHROID) 50 MCG tablet Take 50 mcg by mouth daily.    [provider]  metFORMIN (GLUCOPHAGE) 500 MG tablet Take 1 tablet (500 mg total) by mouth 2 (two) times daily with a meal. 01/01/24   Shahmehdi, Gemma Payor, MD  Multiple Vitamins-Minerals (MULTIVITAMINS  THER. W/MINERALS) TABS Take 1 tablet by mouth every morning.    [provider]  omeprazole (PRILOSEC) 20 MG capsule Take 20 mg by mouth every morning.    [provider]  pregabalin (LYRICA) 150 MG capsule TAKE ONE CAPSULE (150 MG TOTAL) BY MOUTHTWO TIMES DAILY. 06/27/20   Tilda Burrow, MD  rizatriptan (MAXALT-MLT) 10 MG disintegrating tablet Take 10 mg by mouth as needed for migraine. May repeat in 2 hours if needed    [provider]  UNIFINE PENTIPS 31G X 6 MM MISC USE TO INJECT VICTOZA ONCE DAILY 01/10/22   Romero Belling, MD  venlafaxine (EFFEXOR-XR) 150 MG 24 hr capsule Take 150 mg by mouth every morning.    [provider]  Vibegron (GEMTESA) 75 MG TABS Take 75 mg by mouth at bedtime.    [provider]      Allergies    Betadine [povidone iodine] and Penicillins    Review of Systems   Review of Systems  All other systems reviewed and are negative.   Physical Exam Updated Vital Signs BP 117/66   Pulse 80   Temp 98.2 F (36.8 C) (Oral)   Resp 17   Ht 1.676 m (5\' 6" )   Wt 62.9 kg   SpO2 91%   BMI 22.38 kg/m  Physical Exam Vitals and nursing note reviewed.  Constitutional:      General: She is not in acute distress.    Appearance: She is well-developed.  HENT:     Head: Normocephalic and atraumatic.     Mouth/Throat:     Pharynx: No oropharyngeal exudate.  Eyes:     General: No scleral icterus.       Right eye: No discharge.        Left eye: No discharge.     Conjunctiva/sclera: Conjunctivae normal.     Pupils: Pupils are equal, round, and reactive to light.  Neck:     Thyroid: No thyromegaly.     Vascular: No JVD.  Cardiovascular:     Rate and Rhythm: Normal rate and regular rhythm.     Heart sounds: Normal heart sounds. No murmur heard.    No friction rub. No gallop.  Pulmonary:     Effort: Pulmonary effort is normal. No respiratory distress.     Breath sounds: Normal breath sounds. No wheezing or rales.   Abdominal:     General: Bowel sounds are normal. There is no distension.     Palpations: Abdomen is soft. There is no mass.     Tenderness: There is no abdominal tenderness.  Musculoskeletal:        General: No tenderness. Normal range of motion.     Cervical back: Normal range of motion and neck supple.     Right lower leg: No edema.     Left lower leg: No edema.  Lymphadenopathy:     Cervical: No cervical adenopathy.  Skin:  General: Skin is warm and dry.     Findings: No erythema or rash.  Neurological:     Mental Status: She is alert.     Coordination: Coordination normal.     Comments: On my exam this patient is able to move all 4 extremities, she is able to stand with assistance from the wheelchair and rotate to the bed, she gets her feet self onto the bed with minimal assistance, she has no facial droop, she is chewing gum and very comfortable, she is able to follow commands with her arms and her legs with good coordination, she is able to answer my questions with clear speech including her name, where she lives, where she was born.  She knows where she is  Psychiatric:        Behavior: Behavior normal.     ED Results / Procedures / Treatments   Labs (all labs ordered are listed, but only abnormal results are displayed) Labs Reviewed  COMPREHENSIVE METABOLIC PANEL - Abnormal; Notable for the following components:      Result Value   Calcium 8.8 (*)    Alkaline Phosphatase 34 (*)    All other components within normal limits  CBC WITH DIFFERENTIAL/PLATELET - Abnormal; Notable for the following components:   RDW 15.6 (*)    All other components within normal limits  URINALYSIS, W/ REFLEX TO CULTURE (INFECTION SUSPECTED) - Abnormal; Notable for the following components:   APPearance HAZY (*)    Hgb urine dipstick MODERATE (*)    Protein, ur 30 (*)    Leukocytes,Ua MODERATE (*)    Bacteria, UA RARE (*)    All other components within normal limits  MAGNESIUM - Abnormal;  Notable for the following components:   Magnesium 1.4 (*)    All other components within normal limits  URINE CULTURE  LACTIC ACID, PLASMA  PROTIME-INR    EKG EKG Interpretation Date/Time:  Monday March 11 2024 14:59:32 EDT Ventricular Rate:  77 PR Interval:  48 QRS Duration:  95 QT Interval:  420 QTC Calculation: 476 R Axis:   53  Text Interpretation: Sinus rhythm Short PR interval Abnormal R-wave progression, early transition Confirmed by Eber Hong ([id number]) on 03/11/2024 4:14:51 PM  Radiology MR Angiogram Neck W or Wo Contrast Result Date: 03/11/2024 CLINICAL DATA:  Initial evaluation for carotid artery aneurysm. EXAM: MRA NECK WITHOUT AND WITH CONTRAST TECHNIQUE: Multiplanar and multiecho pulse sequences of the neck were obtained without and with intravenous contrast. Angiographic images of the neck were obtained using MRA technique without and with intravenous contrast. CONTRAST:  6.34mL GADAVIST GADOBUTROL 1 MMOL/ML IV SOLN COMPARISON:  None Available. FINDINGS: AORTIC ARCH: Visualized aortic arch within normal limits for caliber with standard 3 vessel morphology. No stenosis or other abnormality about the origin of the great vessels. RIGHT CAROTID SYSTEM: Right common and internal carotid arteries are patent with antegrade flow. No evidence for dissection. No hemodynamically significant stenosis about the right carotid artery system within the neck. LEFT CAROTID SYSTEM: Left common and internal carotid arteries are patent with antegrade flow. No evidence for dissection. No hemodynamically significant stenosis about the left carotid artery system within the neck. VERTEBRAL ARTERIES: Both vertebral arteries arise from the subclavian arteries. No visible proximal subclavian artery stenosis. Left vertebral artery dominant. Vertebral arteries are patent with antegrade flow. No evidence for dissection. No hemodynamically significant stenosis bilateral vertebral artery within the neck. Other:  None. IMPRESSION: Normal MRA of the neck. Electronically Signed   By: Sharlet Salina  Phill Myron M.D.   On: 03/11/2024 23:15   MR BRAIN WO CONTRAST Result Date: 03/11/2024 CLINICAL DATA:  Initial evaluation for acute altered mental status. EXAM: MRI HEAD WITHOUT CONTRAST TECHNIQUE: Multiplanar, multiecho pulse sequences of the brain and surrounding structures were obtained without intravenous contrast. COMPARISON:  Prior CT from 12/25/2023 and MRI from 10/26/2023 FINDINGS: Brain: Generalized age-related cerebral atrophy. Patchy and confluent T2/FLAIR hyperintensity involving the supratentorial cerebral white matter, most characteristic of chronic microvascular ischemic disease, moderately advanced in nature. Few scattered superimposed remote lacunar infarcts present about the hemispheric cerebral white matter. Chronic infarct involving the posterior left insular region noted. Few small remote bilateral cerebellar infarcts noted. 5 mm focus of diffusion signal abnormality seen involving the posterior left parietal white matter (series 5, image 30). Additional subtle 5 mm focus of diffusion signal abnormality seen involving the left periatrial white matter (series 5, image 24). Findings consistent with small evolving early subacute ischemic infarcts. No associated hemorrhage or mass effect. No other evidence for acute or subacute ischemia. No acute intracranial hemorrhage. No significant chronic intracranial blood products. Patient's known sellar/suprasellar mass noted, grossly similar as compared to previous MRI. No other mass lesion, mass effect, or midline shift. No hydrocephalus or extra-axial fluid collection. Vascular: Major intracranial vascular flow voids are maintained. Skull and upper cervical spine: Craniocervical junction within normal limits. Bone marrow signal intensity normal. No scalp soft tissue abnormality. Sinuses/Orbits: Prior bilateral ocular lens replacement. Paranasal sinuses are largely clear.  Trace bilateral mastoid effusions, of doubtful significance. Negative nasopharynx. Other: None. IMPRESSION: 1. Two distinct 5 mm foci of diffusion signal abnormality involving the posterior left parietal and left periatrial white matter, consistent with small evolving ischemic infarcts, early subacute in appearance. No associated hemorrhage or mass effect. 2. No other acute intracranial abnormality. 3. Age-related cerebral atrophy with moderate chronic microvascular ischemic disease, with a few scattered remote infarcts involving the hemispheric cerebral white matter and cerebellum. 4. 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 Electronically Signed   By: Rise Mu M.D.   On: 03/11/2024 23:11   DG Chest 2 View Result Date: 03/11/2024 CLINICAL DATA:  Altered mental status, suspected sepsis EXAM: CHEST - 2 VIEW COMPARISON:  Chest radiograph dated 12/25/2023 FINDINGS: Normal lung volumes. No focal consolidations. No pleural effusion or pneumothorax. The heart size and mediastinal contours are within normal limits. No acute osseous abnormality. Right upper quadrant surgical clips. IMPRESSION: No active cardiopulmonary disease. Electronically Signed   By: Agustin Cree M.D.   On: 03/11/2024 16:21    Procedures Procedures    Medications Ordered in ED Medications  baclofen (LIORESAL) tablet 10 mg (10 mg Oral Given 03/11/24 2240)  aspirin chewable tablet 324 mg (has no administration in time range)  levofloxacin (LEVAQUIN) IVPB 750 mg (0 mg Intravenous Stopped 03/11/24 2233)  gadobutrol (GADAVIST) 1 MMOL/ML injection 6.2 mL (6.2 mLs Intravenous Contrast Given 03/11/24 2026)  pregabalin (LYRICA) capsule 150 mg (150 mg Oral Given 03/11/24 2239)  acetaminophen (TYLENOL) tablet 1,000 mg (1,000 mg Oral Given 03/11/24 2238)    ED Course/ Medical Decision Making/ A&P                                 Medical Decision Making Amount and/or Complexity of Data Reviewed Labs:  ordered. Radiology: ordered. ECG/medicine tests: ordered.  Risk OTC drugs. Prescription drug management. Decision regarding hospitalization.    This patient presents to the ED for concern of  perceived altered mental status by family, this involves an extensive number of treatment options, and is a complaint that carries with it a high risk of complications and morbidity.  The differential diagnosis includes infection, electrolyte abnormality, stroke, toxic metabolic, hypercapnia   Co morbidities that complicate the patient evaluation  Anxiety on Xanax, hypertension on carvedilol, diabetes on Farxiga and metformin, she does take Lyrica, levothyroxine and Breo Ellipta as well as albuterol.   Additional history obtained:  Additional history obtained from medical record External records from outside source obtained and reviewed including prior admission   Lab Tests:  I Ordered, and personally interpreted labs.  The pertinent results include: Lactate of 1.1, magnesium of 1.4, metabolic panel was normal, CBC is normal, INR is 1.0, urine shows likely UTI   Imaging Studies ordered:  I ordered imaging studies including MRI of the brain I independently visualized and interpreted imaging which showed 2 distinct areas of subacute ischemic infarcts, chest x-ray unremarkable I agree with the radiologist interpretation   Cardiac Monitoring: / EKG:  The patient was maintained on a cardiac monitor.  I personally viewed and interpreted the cardiac monitored which showed an underlying rhythm of: Sinus rhythm   Consultations Obtained:  I requested consultation with Dr. Arville Care,  and discussed lab and imaging findings as well as pertinent plan - they recommend: Admission   Problem List / ED Course / Critical interventions / Medication management  Acute ischemic stroke, aspirin given, urinary tract infection, antibiotics ordered I have repeated the neurologic exam at 11:35 PM, the patient  appears to have no focal deficits, I cannot elicit visual disturbances as she is difficult to examine with regards to her visual acuity but she is able to follow commands including finger-nose-finger strength in upper and lower extremities and her cranial nerves III through XII other than her extraocular movements which all appear to be baseline and unchanged from her original arrival I suspect that her generalized weakness and somnolence to some degree is related to the stroke as well as the urinary tract infection I ordered medication including aspirin and antibiotics for infection and new stroke Reevaluation of the patient after these medicines showed that the patient improved I have reviewed the patients home medicines and have made adjustments as needed   Social Determinants of Health:  Elderly   Test / Admission - Considered:  Admit         Final Clinical Impression(s) / ED Diagnoses Final diagnoses:  Acute cystitis without hematuria  Ischemic stroke [facility])    Rx / DC Orders ED Discharge Orders     None         Eber Hong, MD 03/11/24 2336

## 2024-03-11 NOTE — H&P (Signed)
 [place]   PATIENT NAME: [name] White    MR#:  [id number]  DATE OF BIRTH:  [birth year]  DATE OF ADMISSION:  03/11/2024  PRIMARY CARE PHYSICIAN: Benita Stabile, MD   Patient is coming from: Home  REQUESTING/REFERRING PHYSICIAN: Eber Hong, MD  CHIEF COMPLAINT:   Chief Complaint  Patient presents with   Altered Mental Status    HISTORY OF PRESENT ILLNESS:  [name] White is a 83 y.o. Caucasian female with medical history significant for anxiety, asthma, depression, type 2 diabetes mellitus, hypertension, dyslipidemia, hypothyroidism and migraine, who presented to the emergency room with acute onset of generalized weakness which has been worsening over the last couple of days with ultimate status and confusion.  Though she was recently treated for UTI.  Though she denies any paresthesias or focal muscle weakness.  No chest pain or palpitations.  She has been having urinary frequency and urgency with dysuria with no hematuria or flank pain.  No fever or chills.  No cough or wheezing or hemoptysis.  ED Course: When the patient came to the ER, vital signs were within normal.  Labs revealed unremarkable CMP and magnesium levels 1.4.  Lactic acid was 1.1.  CBC was within normal.  UA was positive for UTI.  Urine culture was sent. EKG as reviewed by me :  EKG showed normal sinus rhythm with a rate of 77 with short PR interval. Imaging: Two-view chest x-ray showed no acute cardiopulmonary disease. Noncontrast head CT scan and C-spine CT revealed the following: 1. No acute intracranial abnormality. 2. No acute fracture or static subluxation of the cervical spine. 3. Unchanged appearance of suprasellar mass.   Brain MRI without contrast revealed the following: 1. Two distinct 5 mm foci of diffusion signal abnormality involving the posterior left parietal and left periatrial white matter, consistent with small evolving ischemic infarcts, early subacute in appearance. No associated  hemorrhage or mass effect. 2. No other acute intracranial abnormality. 3. Age-related cerebral atrophy with moderate chronic microvascular ischemic disease, with a few scattered remote infarcts involving the hemispheric cerebral white matter and cerebellum. 4. 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  MRA of the head and neck was normal.  The patient was given a gram of p.o. Tylenol, 150 mg p.o. Lyrica and 750 mg of IV Levaquin.  She admitted to a medical telemetry bed for further evaluation and management. PAST MEDICAL HISTORY:   Past Medical History:  Diagnosis Date   Anxiety    Asthma    ASYMPTOMATIC POSTMENOPAUSAL STATUS 02/09/2009   Cataract    Constipation 03/19/2013   Depression    DIABETES MELLITUS, TYPE II 09/14/2007   HYPERCHOLESTEROLEMIA 02/09/2009   HYPERTENSION 02/09/2009   HYPOTHYROIDISM 09/14/2007   MIGRAINE HEADACHE 09/14/2007   Neuropathy    OSTEOPOROSIS 02/09/2009   PANCREATITIS 09/14/2007   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   PONV (postoperative nausea and vomiting)    Rectocele 03/19/2013   Shortness of breath    SUPERFICIAL PHLEBITIS 09/14/2007   Varicose veins     PAST SURGICAL HISTORY:   Past Surgical History:  Procedure Laterality Date   ABDOMINAL HYSTERECTOMY     ANTERIOR AND POSTERIOR REPAIR N/A 04/02/2013   Procedure: ANTERIOR (CYSTOCELE) AND POSTERIOR REPAIR (RECTOCELE);  Surgeon: Tilda Burrow, MD;  Location: [facility];  Service: Gynecology;  Laterality: N/A;   APPENDECTOMY     BRAIN SURGERY     CATARACT EXTRACTION W/PHACO  12/05/2011   Procedure: CATARACT EXTRACTION PHACO AND  INTRAOCULAR LENS PLACEMENT (IOC);  Surgeon: Gemma Payor;  Location: [facility];  Service: Ophthalmology;  Laterality: Left;  CDE:9.65   CHOLECYSTECTOMY     CRANIOTOMY N/A 06/04/2020   Procedure: Endoscopic Transphenoidal Resection of Recurrent PituitaryTumor;  Surgeon: Barnett Abu, MD;  Location: [facility];  Service: Neurosurgery;  Laterality: N/A;   CYSTOSCOPY      ENDOVENOUS ABLATION SAPHENOUS VEIN W/ LASER  11-03-2011   right greater saphenous vein   left leg done 10-2011   EYE SURGERY  98   right cataract extraction 98   LAPAROSCOPIC NISSEN FUNDOPLICATION     NM ESOPHAGEAL REFLUX  08-11-11   PITUITARY EXCISION  10/1997   POSTERIOR REPAIR     TRANSNASAL APPROACH N/A 06/04/2020   Procedure: TRANSNASAL APPROACH;  Surgeon: Osborn Coho, MD;  Location: [facility] OR;  Service: ENT;  Laterality: N/A;   TRANSPHENOIDAL / TRANSNASAL HYPOPHYSECTOMY / RESECTION PITUITARY TUMOR  08-11-11    SOCIAL HISTORY:   Social History   Tobacco Use   Smoking status: Never   Smokeless tobacco: Never  Substance Use Topics   Alcohol use: No    Alcohol/week: 0.0 standard drinks of alcohol    FAMILY HISTORY:   Family History  Problem Relation Age of Onset   Heart disease Mother    Asthma Mother    COPD Father    Heart disease Father    Stroke Father    Asthma Daughter    Migraines Daughter    Neuropathy Son    Cancer Neg Hx    Anesthesia problems Neg Hx    Hypotension Neg Hx    Malignant hyperthermia Neg Hx    Pseudochol deficiency Neg Hx     DRUG ALLERGIES:   Allergies  Allergen Reactions   Betadine [Povidone Iodine] Hives   Penicillins Rash    REVIEW OF SYSTEMS:   ROS As per history of present illness. All pertinent systems were reviewed above. Constitutional, HEENT, cardiovascular, respiratory, GI, GU, musculoskeletal, neuro, psychiatric, endocrine, integumentary and hematologic systems were reviewed and are otherwise negative/unremarkable except for positive findings mentioned above in the HPI.   MEDICATIONS AT HOME:   Prior to Admission medications   Medication Sig Start Date End Date Taking? Authorizing Provider  baclofen (LIORESAL) 10 MG tablet Take 10 mg by mouth daily. 02/14/24  Yes [provider]  TRINTELLIX 10 MG TABS tablet Take 10 mg by mouth daily. 03/09/24  Yes [provider]  ACCU-CHEK AVIVA PLUS test strip  TEST TWICE DAILY. 01/14/20   Wandra Feinstein, MD  Accu-Chek Softclix Lancets lancets SMARTSIG:Topical 07/12/23   [provider]  acetaminophen (TYLENOL) 325 MG tablet Take 2 tablets (650 mg total) by mouth every 6 (six) hours as needed for mild pain (pain score 1-3) (or Fever >/= 101). 01/01/24   Shahmehdi, Gemma Payor, MD  albuterol (PROVENTIL HFA;VENTOLIN HFA) 108 (90 BASE) MCG/ACT inhaler Inhale 2 puffs into the lungs every 4 (four) hours as needed for wheezing or shortness of breath.    [provider]  ALPRAZolam Prudy Feeler) 0.25 MG tablet Take 1 tablet (0.25 mg total) by mouth daily as needed. 01/02/24   Shahmehdi, Gemma Payor, MD  aspirin 81 MG tablet Take 81 mg by mouth daily.    [provider]  Blood Glucose Monitoring Suppl (ACCU-CHEK GUIDE ME) w/Device KIT  07/12/23   [provider]  BREO ELLIPTA 200-25 MCG/INH AEPB Inhale 1 puff into the lungs daily.  Patient not taking: Reported on 12/26/2023 09/08/18   [provider]  carvedilol (COREG) 3.125 MG tablet TAKE ONE TABLET BY MOUTH TWICE A DAY 09/25/23   Antoine Poche, MD  cycloSPORINE (RESTASIS) 0.05 % ophthalmic emulsion Place 1 drop into both eyes 2 (two) times daily.    [provider]  dapagliflozin propanediol (FARXIGA) 5 MG TABS tablet Take 2.5 mg by mouth daily before breakfast. Patient taking differently: Take 10 mg by mouth daily before breakfast. 12/26/19   Romero Belling, MD  docusate sodium (COLACE) 100 MG capsule Take 1 capsule (100 mg total) by mouth 2 (two) times daily. 06/06/20   Bedelia Person, MD  estradiol (ESTRACE) 0.1 MG/GM vaginal cream Place 1 Applicatorful vaginally at bedtime. 02/28/24   [provider]  furosemide (LASIX) 20 MG tablet May take Lasix 20 mg as NEEDED for leg swelling 03/13/15   Antoine Poche, MD  levothyroxine (SYNTHROID) 50 MCG tablet Take 50 mcg by mouth daily.    [provider]  metFORMIN (GLUCOPHAGE) 500 MG tablet Take 1 tablet (500  mg total) by mouth 2 (two) times daily with a meal. 01/01/24   Shahmehdi, Gemma Payor, MD  Multiple Vitamins-Minerals (MULTIVITAMINS THER. W/MINERALS) TABS Take 1 tablet by mouth every morning.    [provider]  omeprazole (PRILOSEC) 20 MG capsule Take 20 mg by mouth every morning.    [provider]  pregabalin (LYRICA) 150 MG capsule TAKE ONE CAPSULE (150 MG TOTAL) BY MOUTHTWO TIMES DAILY. 06/27/20   Tilda Burrow, MD  rizatriptan (MAXALT-MLT) 10 MG disintegrating tablet Take 10 mg by mouth as needed for migraine. May repeat in 2 hours if needed    [provider]  UNIFINE PENTIPS 31G X 6 MM MISC USE TO INJECT VICTOZA ONCE DAILY 01/10/22   Romero Belling, MD  venlafaxine (EFFEXOR-XR) 150 MG 24 hr capsule Take 150 mg by mouth every morning.    [provider]  Vibegron (GEMTESA) 75 MG TABS Take 75 mg by mouth at bedtime.    [provider]      VITAL SIGNS:  Blood pressure 110/62, pulse 80, temperature 98 F (36.7 C), temperature source Oral, resp. rate 14, height 5\' 6"  (1.676 m), weight 62.9 kg, SpO2 92%.  PHYSICAL EXAMINATION:  Physical Exam  GENERAL:  83 y.o.-year-old Caucasian female patient lying in the bed with no acute distress.  EYES: Pupils equal, round, reactive to light and accommodation. No scleral icterus. Extraocular muscles intact.  HEENT: Head atraumatic, normocephalic. Oropharynx and nasopharynx clear.  NECK:  Supple, no jugular venous distention. No thyroid enlargement, no tenderness.  LUNGS: Normal breath sounds bilaterally, no wheezing, rales,rhonchi or crepitation. No use of accessory muscles of respiration.  CARDIOVASCULAR: Regular rate and rhythm, S1, S2 normal. No murmurs, rubs, or gallops.  ABDOMEN: Soft, nondistended, nontender. Bowel sounds present. No organomegaly or mass.  EXTREMITIES: No pedal edema, cyanosis, or clubbing.  NEUROLOGIC: Cranial nerves II through XII are intact. Muscle strength 5/5 in all extremities.  Sensation intact. Gait not checked.  PSYCHIATRIC: The patient is alert and oriented x 3.  Normal affect and good eye contact. SKIN: No obvious rash, lesion, or ulcer.   LABORATORY PANEL:   CBC Recent Labs  Lab 03/11/24 1431  WBC 7.0  HGB 13.1  HCT 40.0  PLT 267   ------------------------------------------------------------------------------------------------------------------  Chemistries  Recent Labs  Lab 03/11/24 1431 03/11/24 1502  NA 139  --   K 3.9  --   CL 104  --   CO2 26  --  GLUCOSE 78  --   BUN 9  --   CREATININE 0.56  --   CALCIUM 8.8*  --   MG  --  1.4*  AST 17  --   ALT 15  --   ALKPHOS 34*  --   BILITOT 0.6  --    ------------------------------------------------------------------------------------------------------------------  Cardiac Enzymes No results for input(s): "TROPONINI" in the last 168 hours. ------------------------------------------------------------------------------------------------------------------  RADIOLOGY:  MR Angiogram Neck W or Wo Contrast Result Date: 03/11/2024 CLINICAL DATA:  Initial evaluation for carotid artery aneurysm. EXAM: MRA NECK WITHOUT AND WITH CONTRAST TECHNIQUE: Multiplanar and multiecho pulse sequences of the neck were obtained without and with intravenous contrast. Angiographic images of the neck were obtained using MRA technique without and with intravenous contrast. CONTRAST:  6.38mL GADAVIST GADOBUTROL 1 MMOL/ML IV SOLN COMPARISON:  None Available. FINDINGS: AORTIC ARCH: Visualized aortic arch within normal limits for caliber with standard 3 vessel morphology. No stenosis or other abnormality about the origin of the great vessels. RIGHT CAROTID SYSTEM: Right common and internal carotid arteries are patent with antegrade flow. No evidence for dissection. No hemodynamically significant stenosis about the right carotid artery system within the neck. LEFT CAROTID SYSTEM: Left common and internal carotid arteries are  patent with antegrade flow. No evidence for dissection. No hemodynamically significant stenosis about the left carotid artery system within the neck. VERTEBRAL ARTERIES: Both vertebral arteries arise from the subclavian arteries. No visible proximal subclavian artery stenosis. Left vertebral artery dominant. Vertebral arteries are patent with antegrade flow. No evidence for dissection. No hemodynamically significant stenosis bilateral vertebral artery within the neck. Other: None. IMPRESSION: Normal MRA of the neck. Electronically Signed   By: Rise Mu M.D.   On: 03/11/2024 23:15   MR BRAIN WO CONTRAST Result Date: 03/11/2024 CLINICAL DATA:  Initial evaluation for acute altered mental status. EXAM: MRI HEAD WITHOUT CONTRAST TECHNIQUE: Multiplanar, multiecho pulse sequences of the brain and surrounding structures were obtained without intravenous contrast. COMPARISON:  Prior CT from 12/25/2023 and MRI from 10/26/2023 FINDINGS: Brain: Generalized age-related cerebral atrophy. Patchy and confluent T2/FLAIR hyperintensity involving the supratentorial cerebral white matter, most characteristic of chronic microvascular ischemic disease, moderately advanced in nature. Few scattered superimposed remote lacunar infarcts present about the hemispheric cerebral white matter. Chronic infarct involving the posterior left insular region noted. Few small remote bilateral cerebellar infarcts noted. 5 mm focus of diffusion signal abnormality seen involving the posterior left parietal white matter (series 5, image 30). Additional subtle 5 mm focus of diffusion signal abnormality seen involving the left periatrial white matter (series 5, image 24). Findings consistent with small evolving early subacute ischemic infarcts. No associated hemorrhage or mass effect. No other evidence for acute or subacute ischemia. No acute intracranial hemorrhage. No significant chronic intracranial blood products. Patient's known  sellar/suprasellar mass noted, grossly similar as compared to previous MRI. No other mass lesion, mass effect, or midline shift. No hydrocephalus or extra-axial fluid collection. Vascular: Major intracranial vascular flow voids are maintained. Skull and upper cervical spine: Craniocervical junction within normal limits. Bone marrow signal intensity normal. No scalp soft tissue abnormality. Sinuses/Orbits: Prior bilateral ocular lens replacement. Paranasal sinuses are largely clear. Trace bilateral mastoid effusions, of doubtful significance. Negative nasopharynx. Other: None. IMPRESSION: 1. Two distinct 5 mm foci of diffusion signal abnormality involving the posterior left parietal and left periatrial white matter, consistent with small evolving ischemic infarcts, early subacute in appearance. No associated hemorrhage or mass effect. 2. No other acute intracranial abnormality. 3. Age-related cerebral  atrophy with moderate chronic microvascular ischemic disease, with a few scattered remote infarcts involving the hemispheric cerebral white matter and cerebellum. 4. 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 Electronically Signed   By: Rise Mu M.D.   On: 03/11/2024 23:11   DG Chest 2 View Result Date: 03/11/2024 CLINICAL DATA:  Altered mental status, suspected sepsis EXAM: CHEST - 2 VIEW COMPARISON:  Chest radiograph dated 12/25/2023 FINDINGS: Normal lung volumes. No focal consolidations. No pleural effusion or pneumothorax. The heart size and mediastinal contours are within normal limits. No acute osseous abnormality. Right upper quadrant surgical clips. IMPRESSION: No active cardiopulmonary disease. Electronically Signed   By: Agustin Cree M.D.   On: 03/11/2024 16:21      IMPRESSION AND PLAN:  Assessment and Plan: * Acute encephalopathy - This is multifactorial secondary to acute lower UTI and urinary subacute CVA. - The patient will be admitted to a medical telemetry  bed. - We will follow neurochecks every 4 hours for 24 hours. - We will monitor mental status. - We will monitor mental status.  CVA (cerebral vascular accident) [facility]) Brain MRI revealed the following: T wo distinct 5 mm foci of diffusion signal abnormality involving the posterior left parietal and left periatrial white matter, consistent with small evolving ischemic infarcts, early subacute in appearance. No associated hemorrhage or mass effect.   - We will follow neuro checks q.4 hours for 24 hours.   - The patient will be placed on aspirin.   - Will obtain a 2D echo with bubble study .   - A neurology consultation can be called in AM as well as physical/occupation/speech therapy consults will be obtained in a.m.Marland Kitchen   - The patient will be placed on statin therapy and fasting lipids will be checked.     Acute lower UTI - We will place her on IV Rocephin and follow urine culture and sensitivity.  GERD without esophagitis - We will continue PPI therapy.  Anxiety and depression - We will continue Xanax and Effexor XR  Type 2 diabetes mellitus with peripheral neuropathy (HCC) - The patient will be placed on supplemental coverage with NovoLog. - We will hold off metformin. - We will continue  Hypothyroidism - We will continue Synthroid and check TSH.   DVT prophylaxis: Lovenox.  Advanced Care Planning:  Code Status: full code.  Family Communication:  The plan of care was discussed in details with the patient (and family). I answered all questions. The patient agreed to proceed with the above mentioned plan. Further management will depend upon hospital course. Disposition Plan: Back to previous home environment Consults called: none.  All the records are reviewed and case discussed with ED provider.  Status is: Inpatient  At the time of the admission, it appears that the appropriate admission status for this patient is inpatient.  This is judged to be reasonable and  necessary in order to provide the required intensity of service to ensure the patient's safety given the presenting symptoms, physical exam findings and initial radiographic and laboratory data in the context of comorbid conditions.  The patient requires inpatient status due to high intensity of service, high risk of further deterioration and high frequency of surveillance required.  I certify that at the time of admission, it is my clinical judgment that the patient will require inpatient hospital care extending more than 2 midnights.  Dispo: The patient is from: Home              Anticipated d/c is to: Home              Patient currently is not medically stable to d/c.              Difficult to place patient: No  Hannah Beat M.D on 03/12/2024 at 6:22 AM  [facility]   From 7 PM-7 AM, contact night-coverage [email or web address]  CC: Primary care physician; Benita Stabile, MD

## 2024-03-11 NOTE — ED Notes (Signed)
PT remains in MRI

## 2024-03-12 ENCOUNTER — Inpatient Hospital Stay (HOSPITAL_COMMUNITY)

## 2024-03-12 DIAGNOSIS — E039 Hypothyroidism, unspecified: Secondary | ICD-10-CM

## 2024-03-12 DIAGNOSIS — E1142 Type 2 diabetes mellitus with diabetic polyneuropathy: Secondary | ICD-10-CM | POA: Insufficient documentation

## 2024-03-12 DIAGNOSIS — I639 Cerebral infarction, unspecified: Secondary | ICD-10-CM

## 2024-03-12 DIAGNOSIS — F419 Anxiety disorder, unspecified: Secondary | ICD-10-CM | POA: Insufficient documentation

## 2024-03-12 DIAGNOSIS — N390 Urinary tract infection, site not specified: Secondary | ICD-10-CM

## 2024-03-12 DIAGNOSIS — G9340 Encephalopathy, unspecified: Secondary | ICD-10-CM | POA: Diagnosis not present

## 2024-03-12 DIAGNOSIS — R569 Unspecified convulsions: Secondary | ICD-10-CM | POA: Diagnosis not present

## 2024-03-12 DIAGNOSIS — E538 Deficiency of other specified B group vitamins: Secondary | ICD-10-CM

## 2024-03-12 DIAGNOSIS — K219 Gastro-esophageal reflux disease without esophagitis: Secondary | ICD-10-CM | POA: Insufficient documentation

## 2024-03-12 LAB — LIPID PANEL
Cholesterol: 177 mg/dL (ref 0–200)
HDL: 36 mg/dL — ABNORMAL LOW (ref >40)
LDL Cholesterol: 117 mg/dL — ABNORMAL HIGH (ref 0–99)
Total CHOL/HDL Ratio: 4.9 ratio
Triglycerides: 122 mg/dL (ref <150)
VLDL: 24 mg/dL (ref 0–40)

## 2024-03-12 LAB — URINE CULTURE: Culture: <10000 — AB

## 2024-03-12 LAB — VITAMIN B12: Vitamin B-12: 144 pg/mL — ABNORMAL LOW (ref 180–914)

## 2024-03-12 LAB — GLUCOSE, CAPILLARY
Glucose-Capillary: 128 mg/dL — ABNORMAL HIGH (ref 70–99)
Glucose-Capillary: 129 mg/dL — ABNORMAL HIGH (ref 70–99)
Glucose-Capillary: 120 mg/dL — ABNORMAL HIGH (ref 70–99)

## 2024-03-12 LAB — TSH: TSH: 1.364 u[IU]/mL (ref 0.350–4.500)

## 2024-03-12 LAB — CBG MONITORING, ED: Glucose-Capillary: 85 mg/dL (ref 70–99)

## 2024-03-12 MED ORDER — CLOPIDOGREL BISULFATE 75 MG PO TABS
75.0000 mg | ORAL_TABLET | Freq: Every day | ORAL | Status: DC
Start: 2024-03-12 — End: 2024-03-19
  Administered 2024-03-12 – 2024-03-19 (×7): 75 mg via ORAL
  Filled 2024-03-12 (×9): qty 1

## 2024-03-12 MED ORDER — CYANOCOBALAMIN 1000 MCG/ML IJ SOLN
1000.0000 ug | Freq: Every day | INTRAMUSCULAR | Status: AC
  Administered 2024-03-12 – 2024-03-16 (×5): 1000 ug via INTRAMUSCULAR
  Filled 2024-03-12 (×5): qty 1

## 2024-03-12 MED ORDER — INSULIN ASPART 100 UNIT/ML IJ SOLN: 0.0000 [IU] | Freq: Every day | INTRAMUSCULAR | Status: DC

## 2024-03-12 MED ORDER — INSULIN ASPART 100 UNIT/ML IJ SOLN
0.0000 [IU] | Freq: Three times a day (TID) | INTRAMUSCULAR | Status: DC
  Administered 2024-03-12 (×2): 1 [IU] via SUBCUTANEOUS
  Administered 2024-03-13: 2 [IU] via SUBCUTANEOUS
  Administered 2024-03-14 – 2024-03-16 (×2): 1 [IU] via SUBCUTANEOUS

## 2024-03-12 MED ORDER — SODIUM CHLORIDE 0.9 % IV SOLN
1.0000 g | INTRAVENOUS | Status: DC
  Administered 2024-03-12 – 2024-03-14 (×3): 1 g via INTRAVENOUS
  Filled 2024-03-12 (×3): qty 10

## 2024-03-12 MED ORDER — MAGNESIUM SULFATE 4 GM/100ML IV SOLN
4.0000 g | Freq: Once | INTRAVENOUS | Status: AC
  Administered 2024-03-12: 4 g via INTRAVENOUS
  Filled 2024-03-12: qty 100

## 2024-03-12 MED ORDER — ATORVASTATIN CALCIUM 40 MG PO TABS
40.0000 mg | ORAL_TABLET | Freq: Every day | ORAL | Status: DC
Start: 2024-03-12 — End: 2024-03-19
  Administered 2024-03-12 – 2024-03-19 (×7): 40 mg via ORAL
  Filled 2024-03-12 (×8): qty 1

## 2024-03-12 NOTE — Progress Notes (Signed)
   03/12/24 1123  TOC Brief Assessment  Insurance and Status Reviewed  Patient has primary care physician Yes  Home environment has been reviewed Home Alone  Prior level of function: independent  Prior/Current Home Services No current home services  Social Drivers of Health Review SDOH reviewed no interventions necessary  Readmission risk has been reviewed Yes  Transition of care needs no transition of care needs at this time     Patient still in ED, continuing CVA work up. PT eval pending, TOC following. Transition of Care Department [facility]) has reviewed patient and no TOC needs have been identified at this time. We will continue to monitor patient advancement through interdisciplinary progression rounds. If new patient transition needs arise, please place a TOC consult.

## 2024-03-12 NOTE — ED Notes (Signed)
 Patient transported upstairs to admission bed via wheelchair.

## 2024-03-12 NOTE — ED Notes (Signed)
 Patient transported to CT

## 2024-03-12 NOTE — Evaluation (Signed)
 Physical Therapy Evaluation Patient Details Name: [name] White MRN: [id number] DOB: 1941-03-10 Today's Date: 03/12/2024  History of Present Illness  [name] White is a 83 y.o. Caucasian female with medical history significant for anxiety, asthma, depression, type 2 diabetes mellitus, hypertension, dyslipidemia, hypothyroidism and migraine, who presented to the emergency room with acute onset of generalized weakness which has been worsening over the last couple of days with ultimate status and confusion.  Though she was recently treated for UTI.  Though she denies any paresthesias or focal muscle weakness.  No chest pain or palpitations.  She has been having urinary frequency and urgency with dysuria with no hematuria or flank pain.  No fever or chills.  No cough or wheezing or hemoptysis.   Clinical Impression  Pt presents with decreased LE strength, balance, and activity tolerance. Pt demonstrates drifting to the left and to the right during ambulation with 2WRW and CGA. Pt requires mod assist for STS transfer with some leg shaking. Pt would continue to benefit from skilled acute PT for increased LE strength, balance, and endurance for improved level of function and independence with ADL.      If plan is discharge home, recommend the following: A lot of help with walking and/or transfers;A lot of help with bathing/dressing/bathroom;Assist for transportation;Help with stairs or ramp for entrance   Can travel by private vehicle   Yes    Equipment Recommendations None recommended by PT  Recommendations for Other Services       Functional Status Assessment Patient has had a recent decline in their functional status and demonstrates the ability to make significant improvements in function in a reasonable and predictable amount of time.     Precautions / Restrictions Precautions Precautions: Fall Recall of Precautions/Restrictions: Intact Restrictions Weight Bearing Restrictions Per  Provider Order: No      Mobility  Bed Mobility Overal bed mobility: Modified Independent (use of bed rails)             General bed mobility comments: increased time, decreased quality    Transfers Overall transfer level: Needs assistance Equipment used: Rolling walker (2 wheels) Transfers: Sit to/from Stand, Bed to chair/wheelchair/BSC Sit to Stand: Min assist, From elevated surface   Step pivot transfers: Modified independent (Device/Increase time), Contact guard assist       General transfer comment: Pt deonstrates decreased speed and quality during transfers today.    Ambulation/Gait Ambulation/Gait assistance: Modified independent (Device/Increase time), Contact guard assist, Min assist Gait Distance (Feet): 35 Feet Assistive device: Rolling walker (2 wheels), 1 person hand held assist Gait Pattern/deviations: Shuffle, Trunk flexed, Wide base of support Gait velocity: decreased     General Gait Details: Pt able to initiate normal gait pattern with 2WRW with left and right drift in the hallway. Pt states she feels a little more shaky than normal.  Stairs            Wheelchair Mobility     Tilt Bed    Modified Rankin (Stroke Patients Only)       Balance Overall balance assessment: Modified Independent                                           Pertinent Vitals/Pain Pain Assessment Pain Assessment: No/denies pain    Home Living Family/patient expects to be discharged to:: Private residence Living Arrangements: Alone Available Help at Discharge: Family (  2 daughters live close) Type of Home: House (has basement steps which she has not been allowed to go down.) Home Access: Level entry       Home Layout: One level (washer and dryer are now on main floor with patients living area) Home Equipment: Rolling Walker (2 wheels);Rollator (4 wheels);Cane - quad;Cane - single point;Hand held shower head;Shower seat - built in;Grab bars  - tub/shower      Prior Function Prior Level of Function : Independent/Modified Independent             Mobility Comments: uses walker for ambulation       Extremity/Trunk Assessment   Upper Extremity Assessment Upper Extremity Assessment: Overall WFL for tasks assessed    Lower Extremity Assessment Lower Extremity Assessment: Generalized weakness (left stronger than right)    Cervical / Trunk Assessment Cervical / Trunk Assessment: Kyphotic  Communication   Communication Communication: Impaired (Pt has difficulty forming words, but is able to get them out with more time.)    Cognition Arousal: Alert Behavior During Therapy: WFL for tasks assessed/performed   PT - Cognitive impairments: No apparent impairments                         Following commands: Intact       Cueing Cueing Techniques: Verbal cues, Tactile cues, Visual cues     General Comments      Exercises     Assessment/Plan    PT Assessment Patient needs continued PT services  PT Problem List Decreased strength;Decreased activity tolerance;Decreased balance;Decreased mobility;Decreased knowledge of use of DME       PT Treatment Interventions DME instruction;Gait training;Stair training;Functional mobility training;Therapeutic activities;Therapeutic exercise;Balance training;Neuromuscular re-education;Patient/family education    PT Goals (Current goals can be found in the Care Plan section)  Acute Rehab PT Goals Patient Stated Goal: to leave hospital PT Goal Formulation: With patient Time For Goal Achievement: 03/26/24 Potential to Achieve Goals: Good    Frequency Min 3X/week     Co-evaluation               AM-PAC PT "6 Clicks" Mobility  Outcome Measure Help needed turning from your back to your side while in a flat bed without using bedrails?: A Little Help needed moving from lying on your back to sitting on the side of a flat bed without using bedrails?: A Lot Help  needed moving to and from a bed to a chair (including a wheelchair)?: A Lot Help needed standing up from a chair using your arms (e.g., wheelchair or bedside chair)?: A Lot Help needed to walk in hospital room?: A Little Help needed climbing 3-5 steps with a railing? : A Lot 6 Click Score: 14    End of Session Equipment Utilized During Treatment:  (1OXW) Activity Tolerance: Patient tolerated treatment well;Patient limited by fatigue Patient left: Other (comment) Nurse Communication: Mobility status;Precautions PT Visit Diagnosis: Unsteadiness on feet (R26.81);Other abnormalities of gait and mobility (R26.89);Muscle weakness (generalized) (M62.81);History of falling (Z91.81)    Time: 9604-5409 PT Time Calculation (min) (ACUTE ONLY): 18 min   Charges:   PT Evaluation $PT Eval Low Complexity: 1 Low PT Treatments $Therapeutic Activity: 8-22 mins PT General Charges $$ ACUTE PT VISIT: 1 Visit         Luz Lex, PT, DPT [facility] Office: [phone]

## 2024-03-12 NOTE — Consult Note (Addendum)
 I connected with  [name] White on 03/12/24 by a video enabled telemedicine application and verified that I am speaking with the correct person using two identifiers.   I discussed the limitations of evaluation and management by telemedicine. The patient expressed understanding and agreed to proceed.  Location of patient: [facility] Location of physician: [facility]  Neurology Consultation Reason for Consult: Stroke Referring Physician: Dr. Vassie Loll  CC: Altered mental status  History is obtained from: Patient, chart review  HPI: [name] White is a 83 y.o. female with past medical history of hypertension, hyperlipidemia, diabetes, hypothyroidism, migraines, neuropathy, anxiety and depression who presented with weakness and confusion.  ED workup was suggestive of UTI, urine culture has been sent.  Blood pressure on arrival was 117/66.  Additionally MRI brain was obtained as part of encephalopathy workup and showed subacute strokes.  Therefore neurology was consulted for further recommendations.   Patient is a poor historian.  States she thinks she is here because of another UTI.  She was also diagnosed with E. coli UTI in end of December 2024 and that admission was complicated with psychosis requiring Haldol  Of note, per chart review patient was diagnosed with pituitary macroadenoma status post total resection in 1999, debulking transsphenoidal resection of recurrent adenoma in 2021 and 6 weeks of IMRT.  Last known normal: 03/09/2024 Event happened at home No tPA as outside window No thrombectomy as outside window mRS 2   ROS: All other systems reviewed and negative except as noted in the HPI.   Past Medical History:  Diagnosis Date   Anxiety    Asthma    ASYMPTOMATIC POSTMENOPAUSAL STATUS 02/09/2009   Cataract    Constipation 03/19/2013   Depression    DIABETES MELLITUS, TYPE II 09/14/2007   HYPERCHOLESTEROLEMIA 02/09/2009   HYPERTENSION 02/09/2009    HYPOTHYROIDISM 09/14/2007   MIGRAINE HEADACHE 09/14/2007   Neuropathy    OSTEOPOROSIS 02/09/2009   PANCREATITIS 09/14/2007   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   PONV (postoperative nausea and vomiting)    Rectocele 03/19/2013   Shortness of breath    SUPERFICIAL PHLEBITIS 09/14/2007   Varicose veins     Family History  Problem Relation Age of Onset   Heart disease Mother    Asthma Mother    COPD Father    Heart disease Father    Stroke Father    Asthma Daughter    Migraines Daughter    Neuropathy Son    Cancer Neg Hx    Anesthesia problems Neg Hx    Hypotension Neg Hx    Malignant hyperthermia Neg Hx    Pseudochol deficiency Neg Hx     Social History:  reports that she has never smoked. She has never used smokeless tobacco. She reports that she does not drink alcohol and does not use drugs.  Exam: Current vital signs: BP 116/64   Pulse 69   Temp 98 F (36.7 C) (Oral)   Resp 16   Ht 5\' 6"  (1.676 m)   Wt 62.9 kg   SpO2 93%   BMI 22.38 kg/m  Vital signs in last 24 hours: Temp:  [97.9 F (36.6 C)-98.2 F (36.8 C)] 98 F (36.7 C) (03/18 0545) Pulse Rate:  [69-84] 69 (03/18 0645) Resp:  [13-20] 16 (03/18 0645) BP: (96-117)/(57-76) 116/64 (03/18 0645) SpO2:  [91 %-98 %] 93 % (03/18 0645) Weight:  [62.9 kg] 62.9 kg (03/17 1427)   Physical Exam  Constitutional: Appears well-developed and well-nourished.  Psych:  Affect appropriate to situation Neuro: Awake, alert, oriented to place and person not to time (January), follows commands, able to name objects, cranial nerves appear grossly intact, antigravity strength in all 4 extremities without drift, sensory intact to light touch, FTN intact bilateral  NIHSS 0  I have reviewed labs in epic and the results pertinent to this consultation are: CBC:  Recent Labs  Lab 03/11/24 1431  WBC 7.0  NEUTROABS 3.4  HGB 13.1  HCT 40.0  MCV 90.3  PLT 267    Basic Metabolic Panel:  Lab Results  Component Value Date   NA 139  03/11/2024   K 3.9 03/11/2024   CO2 26 03/11/2024   GLUCOSE 78 03/11/2024   BUN 9 03/11/2024   CREATININE 0.56 03/11/2024   CALCIUM 8.8 (L) 03/11/2024   GFRNONAA >60 03/11/2024   GFRAA >60 06/05/2020   Lipid Panel:  Lab Results  Component Value Date   LDLCALC 117 (H) 03/12/2024   HgbA1c:  Lab Results  Component Value Date   HGBA1C 6.0 (H) 12/26/2023   Urine Drug Screen:     Component Value Date/Time   LABOPIA NONE DETECTED 12/26/2023 0220   COCAINSCRNUR NONE DETECTED 12/26/2023 0220   LABBENZ NONE DETECTED 12/26/2023 0220   AMPHETMU NONE DETECTED 12/26/2023 0220   THCU NONE DETECTED 12/26/2023 0220   LABBARB NONE DETECTED 12/26/2023 0220    Alcohol Level     Component Value Date/Time   ETH <10 12/25/2023 2318     I have reviewed the images obtained:  MRI brain without contrast 03/11/2024: Two distinct 5 mm foci of diffusion signal abnormality involving the posterior left parietal and left periatrial white matter, consistent with small evolving ischemic infarcts, early subacute in appearance. No associated hemorrhage or mass effect. No other acute intracranial abnormality. Age-related cerebral atrophy with moderate chronic microvascular ischemic disease, with a few scattered remote infarcts involving the hemispheric cerebral white matter and cerebellum. 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  MRA neck with and without contrast 03/11/2024: Normal MRA of the neck.     ASSESSMENT/PLAN: 83 year old female who presented with altered mental status.  MRI brain showed subacute infarcts.  Subacute ischemic stroke (incidental) -Etiology: Likely small vessel disease  Recommendations: -.  Will obtain MRA head without contrast to look for intracranial stenosis -Reportedly patient is on aspirin 81 mg daily at home already.  Therefore if MRA head shows significant intracranial stenosis, would recommend aspirin 81 mg daily and Plavix 75 mg daily for 3  months followed by monotherapy with Plavix 75 mg daily.  If there is no significant intracranial stenosis on MRA head, recommend aspirin 81 mg daily and Plavix 75 mg daily for 3 weeks followed by monotherapy with Plavix 75 mg daily -Recommend atorvastatin 40 mg daily -TTE ordered and pending.  If negative recommend 30-day cardiac monitor -PT/OT/swallow eval -Stroke education -Goal blood pressure: Normotension -The stroke is incidental and unlikely to be contributing to patient's symptoms.  Further workup of encephalopathy per primary team -Follow-up with neurology in 3 months (order placed) -Discussed plan with Dr. Gwenlyn Perking via secure chat  Thank you for allowing [place] to participate in the care of this patient. If you have any further questions, please contact  me or neurohospitalist.   Lindie Spruce [facility] neurohospitalist

## 2024-03-12 NOTE — Progress Notes (Addendum)
 Progress Note   Patient: [name] White:[id number] DOB: Jun 19, 1941 DOA: 03/11/2024     1 DOS: the patient was seen and examined on 03/12/2024   Brief hospital admission narrative: As per H&P written by Dr. Arville Care on 03/11/2024 [name] White is a 83 y.o. Caucasian female with medical history significant for anxiety, asthma, depression, type 2 diabetes mellitus, hypertension, dyslipidemia, hypothyroidism and migraine, who presented to the emergency room with acute onset of generalized weakness which has been worsening over the last couple of days with ultimate status and confusion.  Though she was recently treated for UTI.  Though she denies any paresthesias or focal muscle weakness.  No chest pain or palpitations.  She has been having urinary frequency and urgency with dysuria with no hematuria or flank pain.  No fever or chills.  No cough or wheezing or hemoptysis.   ED Course: When the patient came to the ER, vital signs were within normal.  Labs revealed unremarkable CMP and magnesium levels 1.4.  Lactic acid was 1.1.  CBC was within normal.  UA was positive for UTI.  Urine culture was sent. EKG as reviewed by me :  EKG showed normal sinus rhythm with a rate of 77 with short PR interval. Imaging: Two-view chest x-ray showed no acute cardiopulmonary disease. Noncontrast head CT scan and C-spine CT revealed the following: 1. No acute intracranial abnormality. 2. No acute fracture or static subluxation of the cervical spine. 3. Unchanged appearance of suprasellar mass.   Brain MRI without contrast revealed the following: 1. Two distinct 5 mm foci of diffusion signal abnormality involving the posterior left parietal and left periatrial white matter, consistent with small evolving ischemic infarcts, early subacute in appearance. No associated hemorrhage or mass effect. 2. No other acute intracranial abnormality. 3. Age-related cerebral atrophy with moderate chronic microvascular ischemic  disease, with a few scattered remote infarcts involving the hemispheric cerebral white matter and cerebellum. 4. Grossly stable appearance of patient's known sellar/suprasellar  Assessment and Plan: * Acute metabolic encephalopathy/UTI/subacute ischemic stroke - Associated with UTI -Patient with incidental finding for CVA; after discussing with neurology service do not feel patient's subacute stroke contributing to her abnormal mentation. -Thyroid panel within normal limits; B12 deficiency appreciated. -Continue normotension, follow MRI as recommended by neurology service and also 2D echo. -Continue aspirin and Plavix as recommended and based on MRI results determine the length of dual antiplatelet therapy.  Patient aspirin will be eventually transition to the use of Plavix on daily basis. -Continue supportive care and follow PT recommendations. -Patient has follow swallowing test and diet has been ordered. -Continue treatment with IV antibiotics and follow culture results for her UTI.  GERD without esophagitis - Continue PPI.  Anxiety and depression - Stable mood -No suicidal ideation hallucination -Continue as needed home dose Xanax -Continue home dose Effexor  Type 2 diabetes mellitus with peripheral neuropathy (HCC) - Continue to hold oral hypoglycemic agents while inpatient -recent A1c 6.0 -Continue sliding scale insulin while inpatient.  Hypothyroidism - Continue Synthroid -TSH within normal limits.  Hyperlipidemia -Continue statin.  B12 deficiency -B12 level 144; aggressive repletion and maintenance therapy initiated.   Subjective:  Afebrile, no chest pain, no nausea or vomiting reported at the moment.  Patient expressed complaints of increased frequency.  No overt bleeding reported.  Physical Exam: Vitals:   03/12/24 1115 03/12/24 1130 03/12/24 1145 03/12/24 1200  BP: (!) 102/54   111/66  Pulse:    85  Resp: 17 19 (!) 21  18  Temp:    97.8 F (36.6 C)   TempSrc:    Oral  SpO2:      Weight:    60.3 kg  Height:    5\' 5"  (1.651 m)   General exam: Alert, awake, in no acute distress and following commands appropriately.  Patient reports increased frequency. Respiratory system: Good saturation on room air; no using accessory muscles. Cardiovascular system:RRR. No rubs or gallops; no JVD. Gastrointestinal system: Abdomen is nondistended, soft and nontender. No organomegaly or masses felt. Normal bowel sounds heard. Central nervous system: Moving 4 limbs spontaneously.  No focal neurological deficits. Extremities: No cyanosis, clubbing or edema. Skin: No petechiae. Psychiatry: Mood & affect appropriate.   Data Reviewed: TSH: 1.364 B12: 144 Lipid panel: Total cholesterol 177, LDL 117, HDL 36 and triglycerides 122  Family Communication: Son at bedside.  Disposition: Status is: Inpatient Remains inpatient appropriate because: Continue IV therapy.   Planned Discharge Destination:  To be determined  Time spent: 50 minutes  Author: Vassie Loll, MD 03/12/2024 3:57 PM  For on call review [email or web address].

## 2024-03-12 NOTE — Assessment & Plan Note (Signed)
-   The patient will be placed on supplemental coverage with NovoLog. - We will hold off metformin. - We will continue

## 2024-03-12 NOTE — Assessment & Plan Note (Signed)
 -  We will continue PPI therapy

## 2024-03-12 NOTE — Progress Notes (Signed)
 Pt arrived to room 317 via WC from radiology. Pt ambulated with standby assist from [facility] to standing scale and then to bed. Tele applied per order, IVF infusing without s/s infiltration. Pt oriented to room and safety procedures. Bed alarm on, call bell within reach. Advised to call for needs and before attempting to get OOB. Pt states understanding.

## 2024-03-12 NOTE — Assessment & Plan Note (Signed)
-   We will place her on IV Rocephin and follow urine culture and sensitivity.

## 2024-03-12 NOTE — ED Notes (Signed)
 Pt speaking with neurologist on telecart

## 2024-03-12 NOTE — Assessment & Plan Note (Signed)
-   We will continue Xanax and Effexor XR

## 2024-03-12 NOTE — Assessment & Plan Note (Signed)
-   This is multifactorial secondary to acute lower UTI and urinary subacute CVA. - The patient will be admitted to a medical telemetry bed. - We will follow neurochecks every 4 hours for 24 hours. - We will monitor mental status. - We will monitor mental status.

## 2024-03-12 NOTE — Plan of Care (Signed)
  Problem: Acute Rehab PT Goals(only PT should resolve) Goal: Pt Will Go Supine/Side To Sit Outcome: Progressing Flowsheets (Taken 03/12/2024 1233) Pt will go Supine/Side to Sit:  with modified independence  with supervision Goal: Patient Will Transfer Sit To/From Stand Outcome: Progressing Flowsheets (Taken 03/12/2024 1233) Patient will transfer sit to/from stand:  with contact guard assist  with modified independence Goal: Pt Will Transfer Bed To Chair/Chair To Bed Outcome: Progressing Flowsheets (Taken 03/12/2024 1233) Pt will Transfer Bed to Chair/Chair to Bed:  with modified independence  with contact guard assist Goal: Pt Will Ambulate Outcome: Progressing Flowsheets (Taken 03/12/2024 1233) Pt will Ambulate:  75 feet  with contact guard assist  with modified independence   Luz Lex, PT, DPT [facility] Office: [phone]

## 2024-03-12 NOTE — Assessment & Plan Note (Signed)
 -  We will continue Synthroid and check TSH.

## 2024-03-12 NOTE — Assessment & Plan Note (Addendum)
 Brain MRI revealed the following: T wo distinct 5 mm foci of diffusion signal abnormality involving the posterior left parietal and left periatrial white matter, consistent with small evolving ischemic infarcts, early subacute in appearance. No associated hemorrhage or mass effect.   - We will follow neuro checks q.4 hours for 24 hours.   - The patient will be placed on aspirin.   - Will obtain a 2D echo with bubble study .   - A neurology consultation can be called in AM as well as physical/occupation/speech therapy consults will be obtained in a.m.Marland Kitchen   - The patient will be placed on statin therapy and fasting lipids will be checked.

## 2024-03-13 ENCOUNTER — Inpatient Hospital Stay (HOSPITAL_COMMUNITY)

## 2024-03-13 DIAGNOSIS — I6389 Other cerebral infarction: Secondary | ICD-10-CM

## 2024-03-13 DIAGNOSIS — I639 Cerebral infarction, unspecified: Secondary | ICD-10-CM | POA: Diagnosis not present

## 2024-03-13 DIAGNOSIS — G9340 Encephalopathy, unspecified: Secondary | ICD-10-CM | POA: Diagnosis not present

## 2024-03-13 DIAGNOSIS — E1142 Type 2 diabetes mellitus with diabetic polyneuropathy: Secondary | ICD-10-CM | POA: Diagnosis not present

## 2024-03-13 DIAGNOSIS — E039 Hypothyroidism, unspecified: Secondary | ICD-10-CM | POA: Diagnosis not present

## 2024-03-13 LAB — ECHOCARDIOGRAM COMPLETE BUBBLE STUDY
AR max vel: 2.34 cm2
AV Area VTI: 2.31 cm2
AV Area mean vel: 2.21 cm2
AV Mean grad: 2 mmHg
AV Peak grad: 4.2 mmHg
Ao pk vel: 1.03 m/s
Area-P 1/2: 2 cm2
MV VTI: 0.92 cm2
S' Lateral: 3 cm
Single Plane A4C EF: 76.9 %

## 2024-03-13 LAB — CBC
HCT: 38.5 % (ref 36.0–46.0)
Hemoglobin: 12.3 g/dL (ref 12.0–15.0)
MCH: 29.4 pg (ref 26.0–34.0)
MCHC: 31.9 g/dL (ref 30.0–36.0)
MCV: 92.1 fL (ref 80.0–100.0)
Platelets: 230 10*3/uL (ref 150–400)
RBC: 4.18 MIL/uL (ref 3.87–5.11)
RDW: 15.8 % — ABNORMAL HIGH (ref 11.5–15.5)
WBC: 6.1 10*3/uL (ref 4.0–10.5)
nRBC: 0 % (ref 0.0–0.2)

## 2024-03-13 LAB — GLUCOSE, CAPILLARY
Glucose-Capillary: 109 mg/dL — ABNORMAL HIGH (ref 70–99)
Glucose-Capillary: 163 mg/dL — ABNORMAL HIGH (ref 70–99)
Glucose-Capillary: 69 mg/dL — ABNORMAL LOW (ref 70–99)
Glucose-Capillary: 119 mg/dL — ABNORMAL HIGH (ref 70–99)
Glucose-Capillary: 129 mg/dL — ABNORMAL HIGH (ref 70–99)

## 2024-03-13 LAB — BASIC METABOLIC PANEL
Anion gap: 8 (ref 5–15)
BUN: 11 mg/dL (ref 8–23)
CO2: 21 mmol/L — ABNORMAL LOW (ref 22–32)
Calcium: 8.7 mg/dL — ABNORMAL LOW (ref 8.9–10.3)
Chloride: 110 mmol/L (ref 98–111)
Creatinine, Ser: 0.62 mg/dL (ref 0.44–1.00)
GFR, Estimated: >60 mL/min (ref >60)
Glucose, Bld: 108 mg/dL — ABNORMAL HIGH (ref 70–99)
Potassium: 4.3 mmol/L (ref 3.5–5.1)
Sodium: 139 mmol/L (ref 135–145)

## 2024-03-13 MED ORDER — VORTIOXETINE HBR 5 MG PO TABS
10.0000 mg | ORAL_TABLET | Freq: Every day | ORAL | Status: DC
  Administered 2024-03-13 – 2024-03-19 (×5): 10 mg via ORAL
  Filled 2024-03-13 (×8): qty 2

## 2024-03-13 MED ORDER — PERFLUTREN LIPID MICROSPHERE
1.0000 mL | INTRAVENOUS | Status: AC | PRN
  Administered 2024-03-13: 2 mL via INTRAVENOUS

## 2024-03-13 NOTE — Progress Notes (Signed)
 Hypoglycemic Event  CBG: 69  Treatment: 4 oz juice/soda  Symptoms: None  Follow-up CBG: Time:1650 CBG Result:119  Possible Reasons for Event: Inadequate meal intake  Comments/MD notified:Dr. Gwenlyn Perking notified    Catalina Lunger

## 2024-03-13 NOTE — Plan of Care (Signed)
 Pt appears to be confused. Attempted to let out of bed several times during the night. Bed alarm prevented pt from exiting the bed. Although confused, pt is alert to answer basic questions about family, self, and stroke questions, but responds slowly with laughter.   Problem: Ischemic Stroke/TIA Tissue Perfusion: Goal: Complications of ischemic stroke/TIA will be minimized Outcome: Progressing   Problem: Health Behavior/Discharge Planning: Goal: Ability to manage health-related needs will improve Outcome: Progressing Goal: Goals will be collaboratively established with patient/family Outcome: Progressing   Problem: Self-Care: Goal: Ability to communicate needs accurately will improve Outcome: Progressing   Problem: Coping: Goal: Ability to adjust to condition or change in health will improve Outcome: Progressing   Problem: Fluid Volume: Goal: Ability to maintain a balanced intake and output will improve Outcome: Progressing   Problem: Health Behavior/Discharge Planning: Goal: Ability to manage health-related needs will improve Outcome: Progressing   Problem: Metabolic: Goal: Ability to maintain appropriate glucose levels will improve Outcome: Progressing   Problem: Nutritional: Goal: Maintenance of adequate nutrition will improve Outcome: Progressing   Problem: Tissue Perfusion: Goal: Adequacy of tissue perfusion will improve Outcome: Progressing   Problem: Health Behavior/Discharge Planning: Goal: Ability to manage health-related needs will improve Outcome: Progressing   Problem: Clinical Measurements: Goal: Ability to maintain clinical measurements within normal limits will improve Outcome: Progressing Goal: Will remain free from infection Outcome: Progressing Goal: Diagnostic test results will improve Outcome: Progressing Goal: Cardiovascular complication will be avoided Outcome: Progressing   Problem: Activity: Goal: Risk for activity intolerance will  decrease Outcome: Progressing   Problem: Elimination: Goal: Will not experience complications related to urinary retention Outcome: Progressing   Problem: Pain Managment: Goal: General experience of comfort will improve and/or be controlled Outcome: Progressing

## 2024-03-13 NOTE — TOC Initial Note (Signed)
 Transition of Care [facility]) - Initial/Assessment Note    Patient Details  Name: [name] White MRN: [id number] Date of Birth: 07/04/1941  Transition of Care [facility]) CM/SW Contact:    Leitha Bleak, RN Phone Number: 03/13/2024, 12:38 PM  Clinical Narrative:     CM called daughter to discuss the PT eval.  Per Glenetta Hew has been coming out for HHPT. She was getting ready to be discharged. She uses a walker and ambulates in the house. She plans to discharge back home with HHPT continuing. She walked 70 feet today with PT. TOC following, DC planning for 2 days.              Expected Discharge Plan: Home w Home Health Services Barriers to Discharge: Continued Medical Work up   Patient Goals and CMS Choice Patient states their goals for this hospitalization and ongoing recovery are:: return home CMS Medicare.gov Compare Post Acute Care list provided to:: Patient Represenative (must comment) Choice offered to / list presented to : Adult Children     Expected Discharge Plan and Services      Living arrangements for the past 2 months: Single Family Home                     Prior Living Arrangements/Services Living arrangements for the past 2 months: Single Family Home Lives with:: Adult Children Patient language and need for interpreter reviewed:: Yes        Need for Family Participation in Patient Care: Yes (Comment) Care giver support system in place?: Yes (comment)   Criminal Activity/Legal Involvement Pertinent to Current Situation/Hospitalization: No - Comment as needed  Activities of Daily Living   ADL Screening (condition at time of admission) Independently performs ADLs?: Yes (appropriate for developmental age) Is the patient deaf or have difficulty hearing?: No Does the patient have difficulty seeing, even when wearing glasses/contacts?: No Does the patient have difficulty concentrating, remembering, or making decisions?: No  Permission Sought/Granted             Permission granted to share info w Relationship: Daughter     Emotional Assessment     Affect (typically observed): Accepting Orientation: : Oriented to Self   Psych Involvement: No (comment)  Admission diagnosis:  Acute cystitis without hematuria [N30.00] Acute encephalopathy [G93.40] Ischemic stroke (HCC) [I63.9] Cerebrovascular accident (CVA), unspecified mechanism (HCC) [I63.9] Patient Active Problem List   Diagnosis Date Noted   CVA (cerebral vascular accident) (HCC) 03/12/2024   Acute lower UTI 03/12/2024   Type 2 diabetes mellitus with peripheral neuropathy (HCC) 03/12/2024   Anxiety and depression 03/12/2024   GERD without esophagitis 03/12/2024   Acute encephalopathy 03/11/2024   Rhabdomyolysis 12/27/2023   Pressure injury of skin 12/27/2023   UTI (urinary tract infection) 12/26/2023   Sepsis due to urinary tract infection (HCC) 12/26/2023   Acute metabolic encephalopathy 12/26/2023   Severe Sepsis with Septic shock (HCC) 12/26/2023   Status post transsphenoidal pituitary resection (HCC) 06/04/2020   Pituitary adenoma with extrasellar extension (HCC) 06/04/2020   Pituitary tumor 05/29/2020   Dizzinesses 02/02/2020   Acute pain of right shoulder 02/02/2020   Encounter for screening mammogram for malignant neoplasm of breast 01/28/2020   Vitamin D deficiency 01/28/2020   Paroxysmal supraventricular tachycardia (HCC) 06/19/2014   Rectocele 03/19/2013   Constipation 03/19/2013   HYPERCHOLESTEROLEMIA 02/09/2009   Essential hypertension 02/09/2009   Osteoporosis 02/09/2009   Post-menopausal 02/09/2009   HEAT INTOLERANCE 02/01/2008   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   Hypothyroidism 09/14/2007  Diabetes (HCC) 09/14/2007   Migraine headache 09/14/2007   SUPERFICIAL PHLEBITIS 09/14/2007   PANCREATITIS 09/14/2007   MENOPAUSAL SYNDROME 09/14/2007   FATIGUE 09/14/2007   PCP:  Benita Stabile, MD Pharmacy:   [facility] - [address], [place] - [address] [place] [address] Phone: [phone] Fax: [phone]  [facility] - [place], [place] - [address] [place] [address] Phone: [phone] Fax: [phone]     Social Drivers of Health (SDOH) Social History: SDOH Screenings   Food Insecurity: No Food Insecurity (03/12/2024)  Housing: Low Risk  (03/12/2024)  Transportation Needs: No Transportation Needs (03/12/2024)  Utilities: Not At Risk (03/12/2024)  Depression (PHQ2-9): Low Risk  (01/28/2020)  Social Connections: Unknown (03/12/2024)  Tobacco Use: Low Risk  (03/11/2024)   SDOH Interventions:    Readmission Risk Interventions    12/27/2023    1:21 PM  Readmission Risk Prevention Plan  Transportation Screening Complete  Home Care Screening Complete  Medication Review (RN CM) Complete

## 2024-03-13 NOTE — Plan of Care (Signed)
  Problem: Ischemic Stroke/TIA Tissue Perfusion: Goal: Complications of ischemic stroke/TIA will be minimized 03/13/2024 0604 by Burman Freestone, LPN Outcome: Progressing 03/13/2024 0555 by Burman Freestone, LPN Outcome: Progressing   Problem: Health Behavior/Discharge Planning: Goal: Ability to manage health-related needs will improve 03/13/2024 0604 by Burman Freestone, LPN Outcome: Progressing 03/13/2024 0555 by Burman Freestone, LPN Outcome: Progressing Goal: Goals will be collaboratively established with patient/family 03/13/2024 0604 by Burman Freestone, LPN Outcome: Progressing 03/13/2024 0555 by Burman Freestone, LPN Outcome: Progressing   Problem: Self-Care: Goal: Ability to communicate needs accurately will improve 03/13/2024 0604 by Burman Freestone, LPN Outcome: Progressing 03/13/2024 0555 by Burman Freestone, LPN Outcome: Progressing   Problem: Fluid Volume: Goal: Ability to maintain a balanced intake and output will improve 03/13/2024 0604 by Burman Freestone, LPN Outcome: Progressing 03/13/2024 0555 by Burman Freestone, LPN Outcome: Progressing   Problem: Health Behavior/Discharge Planning: Goal: Ability to manage health-related needs will improve 03/13/2024 0604 by Burman Freestone, LPN Outcome: Progressing 03/13/2024 0555 by Burman Freestone, LPN Outcome: Progressing   Problem: Metabolic: Goal: Ability to maintain appropriate glucose levels will improve 03/13/2024 0604 by Burman Freestone, LPN Outcome: Progressing 03/13/2024 0555 by Burman Freestone, LPN Outcome: Progressing   Problem: Tissue Perfusion: Goal: Adequacy of tissue perfusion will improve 03/13/2024 0604 by Burman Freestone, LPN Outcome: Progressing 03/13/2024 0555 by Burman Freestone, LPN Outcome: Progressing   Problem: Clinical Measurements: Goal: Ability to maintain clinical measurements within normal limits will improve 03/13/2024 0604 by Burman Freestone, LPN Outcome: Progressing 03/13/2024 0555 by Burman Freestone, LPN Outcome: Progressing Goal: Will remain free from infection 03/13/2024 0604 by Burman Freestone, LPN Outcome: Progressing 03/13/2024 0555 by Burman Freestone, LPN Outcome: Progressing Goal: Diagnostic test results will improve 03/13/2024 0604 by Burman Freestone, LPN Outcome: Progressing 03/13/2024 0555 by Burman Freestone, LPN Outcome: Progressing   Problem: Elimination: Goal: Will not experience complications related to urinary retention 03/13/2024 0604 by Burman Freestone, LPN Outcome: Progressing 03/13/2024 0555 by Burman Freestone, LPN Outcome: Progressing   Problem: Pain Managment: Goal: General experience of comfort will improve and/or be controlled 03/13/2024 0604 by Burman Freestone, LPN Outcome: Progressing 03/13/2024 0555 by Burman Freestone, LPN Outcome: Progressing   Problem: Safety: Goal: Ability to remain free from injury will improve Outcome: Progressing   Problem: Skin Integrity: Goal: Risk for impaired skin integrity will decrease Outcome: Progressing

## 2024-03-13 NOTE — Progress Notes (Signed)
 Mobility Specialist Progress Note:    03/13/24 1204  Mobility  Activity Ambulated with assistance in room;Transferred to/from [facility]  Level of Assistance Minimal assist, patient does 75% or more  Assistive Device Front wheel walker  Distance Ambulated (ft) 15 ft  Range of Motion/Exercises Active;All extremities  Activity Response Tolerated well  Mobility Referral Yes  Mobility visit 1 Mobility  Mobility Specialist Start Time (ACUTE ONLY) 1140  Mobility Specialist Stop Time (ACUTE ONLY) 1200  Mobility Specialist Time Calculation (min) (ACUTE ONLY) 20 min   Pt received in bed, NT in room. Needs assistance to stand and transfer for necessary peri care. Required MinA to stand and ambulate with RW. Tolerated well, asx throughout. Left pt sitting EOB. Alarm on, all needs met.   Lawerance Bach Mobility Specialist Please contact via Special educational needs teacher or  Rehab office at [phone]

## 2024-03-13 NOTE — Progress Notes (Signed)
 Progress Note   Patient: [name] White WUJ:[id number] DOB: [birth year] DOA: 03/11/2024     2 DOS: the patient was seen and examined on 03/13/2024   Brief hospital admission narrative: As per H&P written by Dr. Arville Care on 03/11/2024 [name] White is a 83 y.o. Caucasian female with medical history significant for anxiety, asthma, depression, type 2 diabetes mellitus, hypertension, dyslipidemia, hypothyroidism and migraine, who presented to the emergency room with acute onset of generalized weakness which has been worsening over the last couple of days with ultimate status and confusion.  Though she was recently treated for UTI.  Though she denies any paresthesias or focal muscle weakness.  No chest pain or palpitations.  She has been having urinary frequency and urgency with dysuria with no hematuria or flank pain.  No fever or chills.  No cough or wheezing or hemoptysis.   ED Course: When the patient came to the ER, vital signs were within normal.  Labs revealed unremarkable CMP and magnesium levels 1.4.  Lactic acid was 1.1.  CBC was within normal.  UA was positive for UTI.  Urine culture was sent. EKG as reviewed by me :  EKG showed normal sinus rhythm with a rate of 77 with short PR interval. Imaging: Two-view chest x-ray showed no acute cardiopulmonary disease. Noncontrast head CT scan and C-spine CT revealed the following: 1. No acute intracranial abnormality. 2. No acute fracture or static subluxation of the cervical spine. 3. Unchanged appearance of suprasellar mass.   Brain MRI without contrast revealed the following: 1. Two distinct 5 mm foci of diffusion signal abnormality involving the posterior left parietal and left periatrial white matter, consistent with small evolving ischemic infarcts, early subacute in appearance. No associated hemorrhage or mass effect. 2. No other acute intracranial abnormality. 3. Age-related cerebral atrophy with moderate chronic microvascular ischemic  disease, with a few scattered remote infarcts involving the hemispheric cerebral white matter and cerebellum. 4. Grossly stable appearance of patient's known sellar/suprasellar  Assessment and Plan: * Acute metabolic encephalopathy/UTI/subacute ischemic stroke - Associated with UTI -Patient with incidental finding for CVA; after discussing with neurology service do not feel patient's subacute stroke contributing to her abnormal mentation. -Thyroid panel within normal limits; B12 deficiency appreciated. -Continue normotension and further risk factors modifications to prevent future strokes. -Following rec's by neurology will use aspirin and Plavix for 21 days with a subsequent transition to the use of Plavix only.  MRA not demonstrating large vessel occlusion. -2D echo with preserved ejection fraction, no wall motion abnormalities and no significant valvular disorder. -Physical therapy evaluation recommending home health services at discharge. -Continue treatment with IV antibiotics and follow culture results.  Per records review prior history of E. coli infection resistant to ciprofloxacin.  GERD without esophagitis - Continue PPI. -Lifestyle changes discussed with patient.  Anxiety and depression - Stable mood -No suicidal ideation hallucination -Continue as needed home dose Xanax -Continue home dose Effexor  Type 2 diabetes mellitus with peripheral neuropathy (HCC) - Continue to hold oral hypoglycemic agents while inpatient -recent A1c 6.0 -Continue sliding scale insulin while inpatient. -Modified carbohydrate diet discussed with patient.  Hypothyroidism - Continue Synthroid -TSH within normal limits. -Follow-up thyroid panel in 20-month as an outpatient.  Hyperlipidemia -Continue statin. -Heart healthy diet discussed with patient.  B12 deficiency -B12 level 144; aggressive repletion and maintenance therapy initiated. -Continue to follow B12 trend with repeat levels in 12  weeks.   Subjective:  No fever, no chest pain, no nausea, no vomiting  and no shortness of breath.  Physical Exam: Vitals:   03/12/24 2049 03/12/24 2049 03/12/24 2154 03/12/24 2310  BP: (!) 105/58 (!) 105/58 (!) 105/58 (!) 108/56  Pulse: 76 76 76 76  Resp:    18  Temp: 97.9 F (36.6 C) 97.9 F (36.6 C)  98 F (36.7 C)  TempSrc: Oral Oral  Oral  SpO2: 96% 96%  96%  Weight:      Height:       General exam: Alert, awake and following commands appropriately; patient reports no chest pain, no nausea, no vomiting and no shortness of breath. Respiratory system: Good saturation on room air. Cardiovascular system:RRR. No rubs or gallops; no JVD. Gastrointestinal system: Abdomen is nondistended, soft and nontender. No organomegaly or masses felt. Normal bowel sounds heard. Central nervous system: No focal neurological deficits. Extremities: No cyanosis or clubbing. Skin: No petechiae. Psychiatry: Mood & affect appropriate.   Data Reviewed: TSH: 1.364 B12: 144 Lipid panel: Total cholesterol 177, LDL 117, HDL 36 and triglycerides 122 Basic metabolic panel: Sodium 139, potassium 4.3, chloride 110, bicarb 21, BUN 11, creatinine 0.61 GFR >60 CBC: White blood cell 6.1, hemoglobin 12.3 and platelet count 230K.  Family Communication: Son at bedside.  Disposition: Status is: Inpatient Remains inpatient appropriate because: Continue IV therapy.   Planned Discharge Destination:  To be determined  Time spent: 50 minutes  Author: Vassie Loll, MD 03/13/2024 1:07 PM  For on call review [email or web address].

## 2024-03-13 NOTE — Progress Notes (Signed)
 Physical Therapy Treatment Patient Details Name: [name] White MRN: [id number] DOB: 04-15-1941 Today's Date: 03/13/2024   History of Present Illness [name] White is a 83 y.o. Caucasian female with medical history significant for anxiety, asthma, depression, type 2 diabetes mellitus, hypertension, dyslipidemia, hypothyroidism and migraine, who presented to the emergency room with acute onset of generalized weakness which has been worsening over the last couple of days with ultimate status and confusion.  Though she was recently treated for UTI.  Though she denies any paresthesias or focal muscle weakness.  No chest pain or palpitations.  She has been having urinary frequency and urgency with dysuria with no hematuria or flank pain.  No fever or chills.  No cough or wheezing or hemoptysis.    PT Comments  Increased confusion and difficulty following commands in today's session. Required min a  - CGA w/ use of RW for maintaining functional safety with ADL and gait performance. Required cues for safety. Ambulated to doorway w/ fwd flexed posture and PT manual progression of RW secondary to decreased initiation of RW. Fall risk at this time and recommend continued skilled PT for improving functional safety and mobility tolerance.     If plan is discharge home, recommend the following: A lot of help with walking and/or transfers;A lot of help with bathing/dressing/bathroom;Assist for transportation;Help with stairs or ramp for entrance;Supervision due to cognitive status   Can travel by private vehicle     Yes  Equipment Recommendations  Rolling walker (2 wheels)    Recommendations for Other Services       Precautions / Restrictions Precautions Precautions: Fall Recall of Precautions/Restrictions: Intact Restrictions Weight Bearing Restrictions Per Provider Order: No     Mobility  Bed Mobility Overal bed mobility: Modified Independent             General bed mobility comments:  increased time, decreased quality    Transfers Overall transfer level: Needs assistance Equipment used: Rolling walker (2 wheels) Transfers: Sit to/from Stand Sit to Stand: Min assist, From elevated surface           General transfer comment: Pt deonstrates decreased speed and quality during transfers with mod-max cuing secondary to decreased cognition    Ambulation/Gait Ambulation/Gait assistance: Modified independent (Device/Increase time), Contact guard assist Gait Distance (Feet): 70 Feet Assistive device: Rolling walker (2 wheels) Gait Pattern/deviations: Trunk flexed, Narrow base of support Gait velocity: decreased     General Gait Details: required PT to progress RW secondary to decreased motor planning/initiation of progression of AD; cues at trunk for upright for safety   Stairs             Wheelchair Mobility     Tilt Bed    Modified Rankin (Stroke Patients Only)       Balance Overall balance assessment: Modified Independent                                          Communication Communication Communication: Impaired  Cognition Arousal: Alert, Lethargic     PT - Cognitive impairments: Sequencing, Safety/Judgement, Orientation   Orientation impairments: Person, Place, Time, Situation                     Following commands: Impaired Following commands impaired: Follows one step commands with increased time, Follows one step commands inconsistently    Cueing Cueing Techniques: Verbal cues,  Tactile cues, Visual cues  Exercises General Exercises - Lower Extremity Ankle Circles/Pumps: AROM, Seated, Both, 20 reps Long Arc Quad: AROM, Both, 20 reps, Seated Other Exercises Other Exercises: 5 repetitions sit to stand w/ mod cuing for hand sequencing and placement for safety    General Comments        Pertinent Vitals/Pain Pain Assessment Pain Assessment: No/denies pain    Home Living                           Prior Function            PT Goals (current goals can now be found in the care plan section) Acute Rehab PT Goals Patient Stated Goal: to leave hospital PT Goal Formulation: With patient Time For Goal Achievement: 03/26/24 Potential to Achieve Goals: Good Progress towards PT goals: Progressing toward goals    Frequency    Min 3X/week      PT Plan      Co-evaluation              AM-PAC PT "6 Clicks" Mobility   Outcome Measure  Help needed turning from your back to your side while in a flat bed without using bedrails?: A Little Help needed moving from lying on your back to sitting on the side of a flat bed without using bedrails?: A Little Help needed moving to and from a bed to a chair (including a wheelchair)?: A Lot Help needed standing up from a chair using your arms (e.g., wheelchair or bedside chair)?: A Lot Help needed to walk in hospital room?: A Little Help needed climbing 3-5 steps with a railing? : A Lot 6 Click Score: 15    End of Session Equipment Utilized During Treatment: Gait belt Activity Tolerance: Patient tolerated treatment well;Patient limited by fatigue Patient left: in bed;with bed alarm set;with call bell/phone within reach Nurse Communication: Precautions PT Visit Diagnosis: Unsteadiness on feet (R26.81);Other abnormalities of gait and mobility (R26.89);Muscle weakness (generalized) (M62.81);History of falling (Z91.81)     Time: 6962-9528 PT Time Calculation (min) (ACUTE ONLY): 28 min  Charges:    $Therapeutic Activity: 23-37 mins PT General Charges $$ ACUTE PT VISIT: 1 Visit                     Placido Sou PT, DPT [facility] [place] [phone] office    Samara Deist L Reinhardt Licausi 03/13/2024, 10:00 AM

## 2024-03-14 ENCOUNTER — Inpatient Hospital Stay (HOSPITAL_COMMUNITY)

## 2024-03-14 ENCOUNTER — Inpatient Hospital Stay (HOSPITAL_COMMUNITY)
Admit: 2024-03-14 | Discharge: 2024-03-14 | Disposition: A | Discharge status: Home / Self Care | Attending: Internal Medicine | Admitting: Internal Medicine

## 2024-03-14 DIAGNOSIS — E1142 Type 2 diabetes mellitus with diabetic polyneuropathy: Secondary | ICD-10-CM | POA: Diagnosis not present

## 2024-03-14 DIAGNOSIS — G9340 Encephalopathy, unspecified: Secondary | ICD-10-CM | POA: Diagnosis not present

## 2024-03-14 DIAGNOSIS — E039 Hypothyroidism, unspecified: Secondary | ICD-10-CM | POA: Diagnosis not present

## 2024-03-14 DIAGNOSIS — I639 Cerebral infarction, unspecified: Secondary | ICD-10-CM | POA: Diagnosis not present

## 2024-03-14 LAB — CBC
HCT: 40 % (ref 36.0–46.0)
Hemoglobin: 13.1 g/dL (ref 12.0–15.0)
MCH: 29.4 pg (ref 26.0–34.0)
MCHC: 32.8 g/dL (ref 30.0–36.0)
MCV: 89.9 fL (ref 80.0–100.0)
Platelets: 238 10*3/uL (ref 150–400)
RBC: 4.45 MIL/uL (ref 3.87–5.11)
RDW: 15.2 % (ref 11.5–15.5)
WBC: 7.8 10*3/uL (ref 4.0–10.5)
nRBC: 0 % (ref 0.0–0.2)

## 2024-03-14 LAB — GLUCOSE, CAPILLARY
Glucose-Capillary: 116 mg/dL — ABNORMAL HIGH (ref 70–99)
Glucose-Capillary: 141 mg/dL — ABNORMAL HIGH (ref 70–99)
Glucose-Capillary: 94 mg/dL (ref 70–99)
Glucose-Capillary: 103 mg/dL — ABNORMAL HIGH (ref 70–99)

## 2024-03-14 LAB — BASIC METABOLIC PANEL
Anion gap: 9 (ref 5–15)
BUN: 11 mg/dL (ref 8–23)
CO2: 24 mmol/L (ref 22–32)
Calcium: 8.7 mg/dL — ABNORMAL LOW (ref 8.9–10.3)
Chloride: 103 mmol/L (ref 98–111)
Creatinine, Ser: 0.59 mg/dL (ref 0.44–1.00)
GFR, Estimated: >60 mL/min (ref >60)
Glucose, Bld: 96 mg/dL (ref 70–99)
Potassium: 3.9 mmol/L (ref 3.5–5.1)
Sodium: 136 mmol/L (ref 135–145)

## 2024-03-14 LAB — LACTIC ACID, PLASMA: Lactic Acid, Venous: 0.9 mmol/L (ref 0.5–1.9)

## 2024-03-14 MED ORDER — CEFADROXIL 500 MG PO CAPS
500.0000 mg | ORAL_CAPSULE | Freq: Two times a day (BID) | ORAL | Status: DC
  Administered 2024-03-15: 500 mg via ORAL
  Filled 2024-03-14 (×5): qty 1

## 2024-03-14 NOTE — Consult Note (Signed)
 [facility] Consult Services   Patient Name:   [name] White, [name] White. Date of Birth:   1941-08-02 Identification Number:   MRN - [id number] Date of Service:   03/14/2024 21:55:17  Diagnosis:       G93.49 - Encephalopathy Multifactorial  Impression:      83 y/o F with HTN, HLD, DM2, hypothyroidism, known pituitary mass, who presented to hospital initially on 3/17 with 2-3 days of progressive AMS and generalized weakness, found to have UTI, further work-up of AMS with MRI revealed small, punctate subacute infarcts in the left hemisphere (likely incidental). Stroke alert now activated given worsening AMS and non-verbal since 6:00 AM today. On examination, patient is drowsy, non-verbal, not following commands, but no focal motor deficits, no gaze deviation either. There may also be element of catatonia. Repeat NCHCT this evening did not show acute abnormalities. She is not thrombolytic candidate since LKN > 4.5 hours, and similarly not thrombectomy candidate since LKN > 24 hours, and overall the gradual progression of all of her symptoms would seem more consistent with encephalopathy rather than acute stroke. EEG obtained earlier today also shows triphasic wave morphology that is highly suggestive of moderate encephalopathy.    Overall suspicion is for ongoing metabolic encephalopathy, and/or component of hypoactive delirium or catatonia (reportedly had psychosis during hospital admission in the past as well, per chart review).    Continue management of infection and any metabolic derangements. Recommend neurology follow-up tomorrow.  Our recommendations are outlined below.  Recommendations:        Stroke/Telemetry Floor       Neuro Checks       Bedside Swallow Eval       DVT Prophylaxis       IV Fluids, Normal Saline       Head of Bed 30 Degrees       Euglycemia and Avoid Hyperthermia (PRN Acetaminophen)       Initiate dual antiplatelet therapy with Aspirin 81 mg  daily and Clopidogrel 75 mg daily.  Sign Out:       Discussed with Rapid Response Team    ------------------------------------------------------------------------------  Advanced Imaging: Advanced Imaging Deferred because:  Does not meet criteria due to being out of the 24-hour window for thrombectomy   Metrics: Last Known Well: 03/09/2024 00:00:00 Dispatch Time: 03/14/2024 21:55:17 Initial Response Time: 03/14/2024 22:03:20 Symptoms: AMS. Initial patient interaction: 03/14/2024 22:11:39 NIHSS Assessment Completed: 03/14/2024 22:18:00 Patient is not a candidate for Thrombolytic. Thrombolytic Medical Decision: 03/14/2024 22:19:00 Patient was not deemed candidate for Thrombolytic because of following reasons: LKW outside 4.5 hr window. .  CT head showed no acute hemorrhage or acute core infarct. I personally Reviewed the CT Head and it Showed no acute intracranial abnormalities  Primary Provider Notified of Diagnostic Impression and Management Plan on: 03/14/2024 22:37:49 Spoke With: Dr. Arther Abbott over Epic messaging system Able to Reach 03/14/2024 22:37:49    ------------------------------------------------------------------------------  History of Present Illness: Patient is a 83 year old Female.  Inpatient stroke alert was called for symptoms of AMS. Patient is unable to provide history due to AMS. History is as per RN at bedside as well as chart review.  Patient originally presented to hospital on 3/17 with a few days of generalized weakness and confusion, found to have UTI, started on IV Rocephin. Initial CT head on admission did not show acute abnormalities. She had MRI brain w/o contrast which showed small posterior left parietal and left periatrial white matter subacute infarct, with normal MRA head/neck. Suspected  to be incidental findings, unlikely to explain AMS. Recommended to be on aspirin and Plavix. TSH was normal. B12 noted to be low at 144. No other  significant abnormalities. EEG today showed periodic discharges, triphasic morphology, and generalized slowing (suggestive of toxic-metabolic encephalopathy).  Patient's RN taking care of her had her last night into this morning as well. Notes that yesterday when she had patient, she had generalized weakness and not speaking much, but still responding and answering basic questions. By 6:00 AM today, she was no longer speaking or following commands. Apparently this continued throughout the day. Patient's same RN from last night noticed the ongoing significant change, who alerted on-call attending, and stroke alert subsequently activated. Patient's true last known normal had been documented on previous neurology note as on 3/15.        Past Medical History:      Hypertension      Diabetes Mellitus      Hyperlipidemia      Stroke Other PMH:  hypothyroidism; asthma; anxiety; prior pituitary macroadenoma s/p total resection (1999), then debulking transsphenoial resection of recurrent adenoma in 2021, 6 weeks of IMRT  Medications:  No Anticoagulant use  Antiplatelet use: Yes aspirin 81 mg daily; Plavix 75 mg daily Reviewed EMR for current medications  Allergies:  Reviewed  Social History: Unable To Obtain Due To Patient Status : Patient Cannot Speak  Family History:  There is no family history of premature cerebrovascular disease pertinent to this consultation  ROS : ROS Cannot Be Obtained Because:  Patient Cannot Speak  Past Surgical History: There Is No Surgical History Contributory To Today's Visit     Examination: BP(129/71), Pulse(77), 1A: Level of Consciousness - Arouses to minor stimulation + 1 1B: Ask Month and Age - Could Not Answer Either Question Correctly + 2 1C: Blink Eyes & Squeeze Hands - Performs 0 Tasks + 2 2: Test Horizontal Extraocular Movements - Normal + 0 3: Test Visual Fields - No Visual Loss + 0 4: Test Facial Palsy (Use Grimace if Obtunded) - Normal  symmetry + 0 5A: Test Left Arm Motor Drift - No Drift for 10 Seconds + 0 5B: Test Right Arm Motor Drift - Drift, but doesn't hit bed + 1 6A: Test Left Leg Motor Drift - No Drift for 5 Seconds + 0 6B: Test Right Leg Motor Drift - No Drift for 5 Seconds + 0 7: Test Limb Ataxia (FNF/Heel-Shin) - Does Not Understand + 0 8: Test Sensation - Normal; No sensory loss + 0 9: Test Language/Aphasia - Mute/Global Aphasia: No Usable Speech/Auditory Comprehension + 3 10: Test Dysarthria - Mute/Anarthric + 2 11: Test Extinction/Inattention - No abnormality + 0  NIHSS Score: 11   Pre-Morbid Modified Rankin Scale: 3 Points = Moderate disability; requiring some help, but able to walk without assistance  Spoke with : Dr. Arther Abbott over Epic messaging system I reviewed the available imaging via Rapid and initiated discussion with the primary provider  This consult was conducted in real time using interactive audio and Immunologist. Patient was informed of the technology being used for this visit and agreed to proceed. Patient located in hospital and provider located at home/office setting.   Patient is being evaluated for possible acute neurologic impairment and high probability of imminent or life-threatening deterioration. I spent total of 42 minutes providing care to this patient, including time for face to face visit via telemedicine, review of medical records, imaging studies and discussion of findings with providers, the patient  and/or family.   Dr Peri Jefferson   [facility] For Inpatient follow-up with [facility] physician please call [facility] at [phone]. As we are not an outpatient service for any post hospital discharge needs please contact the hospital for assistance. If you have any questions for the [facility] physicians or need to reconsult for clinical or diagnostic changes please contact us via [facility] at [phone].

## 2024-03-14 NOTE — Progress Notes (Signed)
 Physical Therapy Treatment Patient Details Name: [name] White MRN: [id number] DOB: [birth year] Today's Date: 03/14/2024   History of Present Illness [name] White is a 83 y.o. Caucasian female with medical history significant for anxiety, asthma, depression, type 2 diabetes mellitus, hypertension, dyslipidemia, hypothyroidism and migraine, who presented to the emergency room with acute onset of generalized weakness which has been worsening over the last couple of days with ultimate status and confusion.  Though she was recently treated for UTI.  Though she denies any paresthesias or focal muscle weakness.  No chest pain or palpitations.  She has been having urinary frequency and urgency with dysuria with no hematuria or flank pain.  No fever or chills.  No cough or wheezing or hemoptysis.    PT Comments  Patient has difficulty sitting up at bedside and scooting to EOB due to weakness, very unsteady on feet, requires tactile assistance for properly using RW and limited mostly due to fatigue and fall risk. Patient tolerated sitting up in chair after therapy with her son present - nurse notified. Patient will benefit from continued skilled physical therapy in hospital and recommended venue below to increase strength, balance, endurance for safe ADLs and gait.     If plan is discharge home, recommend the following: A lot of help with walking and/or transfers;A lot of help with bathing/dressing/bathroom;Assist for transportation;Help with stairs or ramp for entrance;Supervision due to cognitive status   Can travel by private vehicle     Yes  Equipment Recommendations  Rolling walker (2 wheels)    Recommendations for Other Services       Precautions / Restrictions Precautions Precautions: Fall Restrictions Weight Bearing Restrictions Per Provider Order: No     Mobility  Bed Mobility Overal bed mobility: Needs Assistance Bed Mobility: Supine to Sit     Supine to sit: Min assist, Mod  assist     General bed mobility comments: slow labored movement, difficulty scooting to EOB    Transfers Overall transfer level: Needs assistance Equipment used: Rolling walker (2 wheels) Transfers: Sit to/from Stand, Bed to chair/wheelchair/BSC Sit to Stand: Min assist, Mod assist   Step pivot transfers: Min assist, Mod assist       General transfer comment: has difficlty completing sit to stands due to BLE weakness, requires repeated verbal/tactile cueing for safey when transferring to chair with fair/poor carryover    Ambulation/Gait Ambulation/Gait assistance: Mod assist Gait Distance (Feet): 40 Feet Assistive device: Rolling walker (2 wheels) Gait Pattern/deviations: Trunk flexed, Narrow base of support, Decreased step length - right, Decreased step length - left, Decreased stride length Gait velocity: decreased     General Gait Details: unsteady labored movement with most difficulty making turns, has tendency to lean over RW instead of pushing requiring assistance to prevenet loss of balance   Stairs             Wheelchair Mobility     Tilt Bed    Modified Rankin (Stroke Patients Only)       Balance Overall balance assessment: Needs assistance Sitting-balance support: Feet supported, No upper extremity supported Sitting balance-Leahy Scale: Fair Sitting balance - Comments: fair/good seated at EOB   Standing balance support: Reliant on assistive device for balance, During functional activity, Bilateral upper extremity supported Standing balance-Leahy Scale: Poor Standing balance comment: fair/poor using RW                            Communication Communication  Communication: Impaired Factors Affecting Communication: Other (comment) (mostly non-verbal)  Cognition Arousal: Alert Behavior During Therapy: Flat affect   PT - Cognitive impairments: History of cognitive impairments   Orientation impairments: Person                      Following commands: Impaired Following commands impaired: Follows one step commands with increased time, Follows one step commands inconsistently    Cueing Cueing Techniques: Verbal cues, Tactile cues  Exercises General Exercises - Lower Extremity Ankle Circles/Pumps: Seated, AROM, Strengthening, Both, 10 reps Long Arc Quad: AROM, Both, Seated, 10 reps, AAROM Hip Flexion/Marching: Seated, Strengthening, AAROM, Both, 10 reps    General Comments        Pertinent Vitals/Pain Pain Assessment Pain Assessment: No/denies pain    Home Living                          Prior Function            PT Goals (current goals can now be found in the care plan section) Acute Rehab PT Goals Patient Stated Goal: return home PT Goal Formulation: With patient/family Time For Goal Achievement: 03/26/24 Potential to Achieve Goals: Good Progress towards PT goals: Progressing toward goals    Frequency    Min 3X/week      PT Plan      Co-evaluation              AM-PAC PT "6 Clicks" Mobility   Outcome Measure  Help needed turning from your back to your side while in a flat bed without using bedrails?: A Little Help needed moving from lying on your back to sitting on the side of a flat bed without using bedrails?: A Little Help needed moving to and from a bed to a chair (including a wheelchair)?: A Lot Help needed standing up from a chair using your arms (e.g., wheelchair or bedside chair)?: A Lot Help needed to walk in hospital room?: A Lot Help needed climbing 3-5 steps with a railing? : A Lot 6 Click Score: 14    End of Session Equipment Utilized During Treatment: Gait belt Activity Tolerance: Patient tolerated treatment well;Patient limited by fatigue Patient left: in chair;with call bell/phone within reach;with chair alarm set;with family/visitor present Nurse Communication: Mobility status PT Visit Diagnosis: Unsteadiness on feet (R26.81);Other abnormalities  of gait and mobility (R26.89);Muscle weakness (generalized) (M62.81);History of falling (Z91.81)     Time: 1914-7829 PT Time Calculation (min) (ACUTE ONLY): 21 min  Charges:    $Gait Training: 8-22 mins $Therapeutic Exercise: 8-22 mins PT General Charges $$ ACUTE PT VISIT: 1 Visit                     12:26 PM, 03/14/24 Ocie Bob, MPT Physical Therapist with [facility] [phone] office [phone] mobile phone

## 2024-03-14 NOTE — Progress Notes (Signed)
 EEG complete - results pending

## 2024-03-14 NOTE — [place] FL2 (Signed)
 [place] MEDICAID FL2 LEVEL OF CARE FORM     IDENTIFICATION  Patient Name: [name] White Birthdate: 03/19/1941 Sex: female Admission Date (Current Location): 03/11/2024  [facility] and [place] Number:  [employer] and Address:  [facility],  [address] [place] [address]      Provider Number: [id number]  Attending Physician Name and Address:  Vassie Loll, MD  Relative Name and Phone Number:  Merlyn Lot (Daughter)  [phone]    Current Level of Care:   Recommended Level of Care: Skilled Nursing Facility Prior Approval Number:    Date Approved/Denied:   PASRR Number: [id number]  Discharge Plan: SNF    Current Diagnoses: Patient Active Problem List   Diagnosis Date Noted   CVA (cerebral vascular accident) (HCC) 03/12/2024   Acute lower UTI 03/12/2024   Type 2 diabetes mellitus with peripheral neuropathy (HCC) 03/12/2024   Anxiety and depression 03/12/2024   GERD without esophagitis 03/12/2024   Acute encephalopathy 03/11/2024   Rhabdomyolysis 12/27/2023   Pressure injury of skin 12/27/2023   UTI (urinary tract infection) 12/26/2023   Sepsis due to urinary tract infection (HCC) 12/26/2023   Acute metabolic encephalopathy 12/26/2023   Severe Sepsis with Septic shock (HCC) 12/26/2023   Status post transsphenoidal pituitary resection (HCC) 06/04/2020   Pituitary adenoma with extrasellar extension (HCC) 06/04/2020   Pituitary tumor 05/29/2020   Dizzinesses 02/02/2020   Acute pain of right shoulder 02/02/2020   Encounter for screening mammogram for malignant neoplasm of breast 01/28/2020   Vitamin D deficiency 01/28/2020   Paroxysmal supraventricular tachycardia (HCC) 06/19/2014   Rectocele 03/19/2013   Constipation 03/19/2013   HYPERCHOLESTEROLEMIA 02/09/2009   Essential hypertension 02/09/2009   Osteoporosis 02/09/2009   Post-menopausal 02/09/2009   HEAT INTOLERANCE 02/01/2008   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   Hypothyroidism  09/14/2007   Diabetes (HCC) 09/14/2007   Migraine headache 09/14/2007   SUPERFICIAL PHLEBITIS 09/14/2007   PANCREATITIS 09/14/2007   MENOPAUSAL SYNDROME 09/14/2007   FATIGUE 09/14/2007    Orientation RESPIRATION BLADDER Height & Weight     Self  Normal Continent Weight: 60.3 kg Height:  5\' 5"  (165.1 cm)  BEHAVIORAL SYMPTOMS/MOOD NEUROLOGICAL BOWEL NUTRITION STATUS      Continent Diet (See DC summary)  AMBULATORY STATUS COMMUNICATION OF NEEDS Skin   Extensive Assist Verbally Normal                       Personal Care Assistance Level of Assistance  Bathing, Feeding, Dressing Bathing Assistance: Maximum assistance Feeding assistance: Limited assistance Dressing Assistance: Maximum assistance     Functional Limitations Info  Sight, Hearing, Speech Sight Info: Impaired Hearing Info: Impaired Speech Info: Adequate    SPECIAL CARE FACTORS FREQUENCY  PT (By licensed PT)     PT Frequency: 5 times a week              Contractures Contractures Info: Not present    Additional Factors Info  Code Status, Allergies Code Status Info: Full Allergies Info: Betadine, Penicillins           Current Medications (03/14/2024):  This is the current hospital active medication list Current Facility-Administered Medications  Medication Dose Route Frequency Provider Last Rate Last Admin   acetaminophen (TYLENOL) tablet 650 mg  650 mg Oral Q4H PRN Mansy, Jan A, MD       Or   acetaminophen (TYLENOL) 160 MG/5ML solution 650 mg  650 mg Per Tube Q4H PRN Mansy, Vernetta Honey, MD  Or   acetaminophen (TYLENOL) suppository 650 mg  650 mg Rectal Q4H PRN Mansy, Jan A, MD       albuterol (PROVENTIL) (2.5 MG/3ML) 0.083% nebulizer solution 3 mL  3 mL Inhalation Q4H PRN Mansy, Jan A, MD   3 mL at 03/13/24 2055   ALPRAZolam Prudy Feeler) tablet 0.25 mg  0.25 mg Oral Daily PRN Mansy, Jan A, MD   0.25 mg at 03/12/24 2231   aspirin chewable tablet 81 mg  81 mg Oral Daily Mansy, Jan A, MD   81 mg at  03/14/24 0840   atorvastatin (LIPITOR) tablet 40 mg  40 mg Oral Daily Vassie Loll, MD   40 mg at 03/14/24 5956   baclofen (LIORESAL) tablet 10 mg  10 mg Oral TID Eber Hong, MD   10 mg at 03/14/24 0839   carvedilol (COREG) tablet 3.125 mg  3.125 mg Oral BID Mansy, Jan A, MD   3.125 mg at 03/14/24 3875   cefTRIAXone (ROCEPHIN) 1 g in sodium chloride 0.9 % 100 mL IVPB  1 g Intravenous Q24H Vassie Loll, MD 200 mL/hr at 03/13/24 1642 1 g at 03/13/24 1642   clopidogrel (PLAVIX) tablet 75 mg  75 mg Oral Daily Vassie Loll, MD   75 mg at 03/14/24 6433   cyanocobalamin (VITAMIN B12) injection 1,000 mcg  1,000 mcg Intramuscular Daily Vassie Loll, MD   1,000 mcg at 03/14/24 0840   cycloSPORINE (RESTASIS) 0.05 % ophthalmic emulsion 1 drop  1 drop Both Eyes BID Mansy, Jan A, MD   1 drop at 03/14/24 2951   docusate sodium (COLACE) capsule 100 mg  100 mg Oral BID Mansy, Jan A, MD   100 mg at 03/14/24 0840   enoxaparin (LOVENOX) injection 40 mg  40 mg Subcutaneous Q24H Mansy, Jan A, MD   40 mg at 03/14/24 0840   furosemide (LASIX) tablet 20 mg  20 mg Oral Daily PRN Mansy, Jan A, MD       insulin aspart (novoLOG) injection 0-5 Units  0-5 Units Subcutaneous QHS Mansy, Jan A, MD       insulin aspart (novoLOG) injection 0-9 Units  0-9 Units Subcutaneous TID WC Mansy, Jan A, MD   2 Units at 03/13/24 1309   iohexol (OMNIPAQUE) 350 MG/ML injection 75 mL  75 mL Intravenous Once PRN Mansy, Jan A, MD       levothyroxine (SYNTHROID) tablet 50 mcg  50 mcg Oral Q0600 Mansy, Jan A, MD   50 mcg at 03/14/24 8841   mirabegron ER (MYRBETRIQ) tablet 25 mg  25 mg Oral Daily Mansy, Jan A, MD   25 mg at 03/14/24 6606   multivitamin with minerals tablet 1 tablet  1 tablet Oral Daily Mansy, Jan A, MD   1 tablet at 03/14/24 0839   ondansetron [facility]) injection 4 mg  4 mg Intravenous Q4H PRN Mansy, Jan A, MD       pantoprazole (PROTONIX) EC tablet 40 mg  40 mg Oral Daily Mansy, Jan A, MD   40 mg at 03/14/24 3016    senna-docusate (Senokot-S) tablet 1 tablet  1 tablet Oral QHS PRN Mansy, Jan A, MD       traZODone (DESYREL) tablet 25 mg  25 mg Oral QHS PRN Mansy, Jan A, MD   25 mg at 03/12/24 2231   venlafaxine XR (EFFEXOR-XR) 24 hr capsule 150 mg  150 mg Oral BH-q7a Mansy, Jan A, MD   150 mg at 03/14/24 0109   vortioxetine HBr (TRINTELLIX) tablet  10 mg  10 mg Oral Daily Vassie Loll, MD   10 mg at 03/14/24 5956     Discharge Medications: Please see discharge summary for a list of discharge medications.  Relevant Imaging Results:  Relevant Lab Results:   Additional Information SS# [id number]  Leitha Bleak, RN

## 2024-03-14 NOTE — Procedures (Signed)
 Patient Name: [name] White  MRN: [id number]  Epilepsy Attending: Charlsie Quest  Referring Physician/Provider: Vassie Loll, MD  Date: 03/14/2024  Duration: 22.07 mins  Patient history: 83yo F with ams. EEG to evaluate for seizure  Level of alertness: Awake  AEDs during EEG study: None  Technical aspects: This EEG study was done with scalp electrodes positioned according to the 10-20 International system of electrode placement. Electrical activity was reviewed with band pass filter of 1-70Hz , sensitivity of 7 uV/mm, display speed of 10mm/sec with a 60Hz  notched filter applied as appropriate. EEG data were recorded continuously and digitally stored.  Video monitoring was available and reviewed as appropriate.  Description: EEG showed continuous generalized 3 to 5 Hz theta-delta slowing. Generalized periodic discharges with triphasic morphology at 1 Hz were also noted. Hyperventilation and photic stimulation were not performed.     Of note, study was technically difficult due to significant myogenic artifact.   ABNORMALITY - Periodic discharges with triphasic morphology, generalized ( GPDs) - Continuous slow, generalized  IMPRESSION: This technically difficult study is suggestive of moderate diffuse encephalopathy likely related to toxic-metabolic causes. No seizures or definite epileptiform discharges were seen throughout the recording.  [name] White

## 2024-03-14 NOTE — Plan of Care (Signed)
  Problem: Ischemic Stroke/TIA Tissue Perfusion: Goal: Complications of ischemic stroke/TIA will be minimized Outcome: Progressing   Problem: Health Behavior/Discharge Planning: Goal: Ability to manage health-related needs will improve Outcome: Progressing Goal: Goals will be collaboratively established with patient/family Outcome: Progressing   Problem: Health Behavior/Discharge Planning: Goal: Ability to manage health-related needs will improve Outcome: Progressing   Problem: Metabolic: Goal: Ability to maintain appropriate glucose levels will improve Outcome: Progressing   Problem: Tissue Perfusion: Goal: Adequacy of tissue perfusion will improve Outcome: Progressing   Problem: Clinical Measurements: Goal: Ability to maintain clinical measurements within normal limits will improve Outcome: Progressing Goal: Will remain free from infection Outcome: Progressing Goal: Diagnostic test results will improve Outcome: Progressing   Problem: Safety: Goal: Ability to remain free from injury will improve Outcome: Progressing   Problem: Skin Integrity: Goal: Risk for impaired skin integrity will decrease Outcome: Progressing

## 2024-03-14 NOTE — Progress Notes (Signed)
 Attempted NIHSS questionnaire. Pt demonstrated disorientation to all questions. Did not respond or answer. Collected v/s, within normal range. Alerted charge nurse of findings. Code Stroke Alert protocol implemented - Code team and MD arrived. Pt taken to CT for brain scan. No findings of further brain damage. Pt refused all medications. Will continue to monitor pts cognitive function throughout nursing shift.

## 2024-03-14 NOTE — Evaluation (Signed)
 Speech Language Pathology Evaluation Patient Details Name: [name] White MRN: [id number] DOB: [birth year] Today's Date: 03/14/2024 Time: 1640-1703 Date seen 03/13/2024 SLP Time Calculation (min) (ACUTE ONLY): 23 min  Problem List:  Patient Active Problem List   Diagnosis Date Noted   CVA (cerebral vascular accident) (HCC) 03/12/2024   Acute lower UTI 03/12/2024   Type 2 diabetes mellitus with peripheral neuropathy (HCC) 03/12/2024   Anxiety and depression 03/12/2024   GERD without esophagitis 03/12/2024   Acute encephalopathy 03/11/2024   Rhabdomyolysis 12/27/2023   Pressure injury of skin 12/27/2023   UTI (urinary tract infection) 12/26/2023   Sepsis due to urinary tract infection (HCC) 12/26/2023   Acute metabolic encephalopathy 12/26/2023   Severe Sepsis with Septic shock (HCC) 12/26/2023   Status post transsphenoidal pituitary resection (HCC) 06/04/2020   Pituitary adenoma with extrasellar extension (HCC) 06/04/2020   Pituitary tumor 05/29/2020   Dizzinesses 02/02/2020   Acute pain of right shoulder 02/02/2020   Encounter for screening mammogram for malignant neoplasm of breast 01/28/2020   Vitamin D deficiency 01/28/2020   Paroxysmal supraventricular tachycardia (HCC) 06/19/2014   Rectocele 03/19/2013   Constipation 03/19/2013   HYPERCHOLESTEROLEMIA 02/09/2009   Essential hypertension 02/09/2009   Osteoporosis 02/09/2009   Post-menopausal 02/09/2009   HEAT INTOLERANCE 02/01/2008   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   Hypothyroidism 09/14/2007   Diabetes (HCC) 09/14/2007   Migraine headache 09/14/2007   SUPERFICIAL PHLEBITIS 09/14/2007   PANCREATITIS 09/14/2007   MENOPAUSAL SYNDROME 09/14/2007   FATIGUE 09/14/2007   Past Medical History:  Past Medical History:  Diagnosis Date   Anxiety    Asthma    ASYMPTOMATIC POSTMENOPAUSAL STATUS 02/09/2009   Cataract    Constipation 03/19/2013   Depression    DIABETES MELLITUS, TYPE II 09/14/2007   HYPERCHOLESTEROLEMIA 02/09/2009    HYPERTENSION 02/09/2009   HYPOTHYROIDISM 09/14/2007   MIGRAINE HEADACHE 09/14/2007   Neuropathy    OSTEOPOROSIS 02/09/2009   PANCREATITIS 09/14/2007   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   PONV (postoperative nausea and vomiting)    Rectocele 03/19/2013   Shortness of breath    SUPERFICIAL PHLEBITIS 09/14/2007   Varicose veins    Past Surgical History:  Past Surgical History:  Procedure Laterality Date   ABDOMINAL HYSTERECTOMY     ANTERIOR AND POSTERIOR REPAIR N/A 04/02/2013   Procedure: ANTERIOR (CYSTOCELE) AND POSTERIOR REPAIR (RECTOCELE);  Surgeon: Tilda Burrow, MD;  Location: [facility];  Service: Gynecology;  Laterality: N/A;   APPENDECTOMY     BRAIN SURGERY     CATARACT EXTRACTION W/PHACO  12/05/2011   Procedure: CATARACT EXTRACTION PHACO AND INTRAOCULAR LENS PLACEMENT (IOC);  Surgeon: Gemma Payor;  Location: [facility];  Service: Ophthalmology;  Laterality: Left;  CDE:9.65   CHOLECYSTECTOMY     CRANIOTOMY N/A 06/04/2020   Procedure: Endoscopic Transphenoidal Resection of Recurrent PituitaryTumor;  Surgeon: Barnett Abu, MD;  Location: [facility];  Service: Neurosurgery;  Laterality: N/A;   CYSTOSCOPY     ENDOVENOUS ABLATION SAPHENOUS VEIN W/ LASER  11-03-2011   right greater saphenous vein   left leg done 10-2011   EYE SURGERY  98   right cataract extraction 98   LAPAROSCOPIC NISSEN FUNDOPLICATION     NM ESOPHAGEAL REFLUX  08-11-11   PITUITARY EXCISION  10/1997   POSTERIOR REPAIR     TRANSNASAL APPROACH N/A 06/04/2020   Procedure: TRANSNASAL APPROACH;  Surgeon: Osborn Coho, MD;  Location: [facility] OR;  Service: ENT;  Laterality: N/A;   TRANSPHENOIDAL / TRANSNASAL HYPOPHYSECTOMY / RESECTION PITUITARY TUMOR  08-11-11   HPI:  [name] White is a 83 y.o. Caucasian female with medical history significant for anxiety, asthma, depression, type 2 diabetes mellitus, hypertension, dyslipidemia, hypothyroidism and migraine, who presented to the emergency room with acute onset of generalized weakness which has  been worsening over the last couple of days with ultimate status and confusion.  Though she was recently treated for UTI. MRI Reveals "Two distinct 5 mm foci of diffusion signal abnormality involving  the posterior left parietal and left periatrial white matter,  consistent with small evolving ischemic infarcts, early subacute in  appearance" SLE requested.   Assessment / Plan / Recommendation Clinical Impression  Speech language evaluation completed while Pt was sitting upright in bed. Pt presents with moderate/severe cognitive impairment. Word finding seems to be intact naming 10/10 requested objects. Pt was able to state her name but when asked any other question presented with long silent pauses and is seemingly unable to answer. Pt reports to me she has a dog but cannot tell me his name. With single words Pt recalled 0/5 with a delay, however when read a brief story she did recall 50% of the information requested. Pt will benefit from continued ST and a more in depth cognitive SLE and ST as indicated at venue recommended below. Pt's safety, awareness and executive functioning is impaired and therefore will require constant or frequent supervision upon d/c. ST will continue to follow acutely. Thank you,    SLP Assessment  SLP Recommendation/Assessment: Patient needs continued Speech Lanaguage Pathology Services SLP Visit Diagnosis: Cognitive communication deficit (R41.841)    Recommendations for follow up therapy are one component of a multi-disciplinary discharge planning process, led by the attending physician.  Recommendations may be updated based on patient status, additional functional criteria and insurance authorization.    Follow Up Recommendations  Skilled nursing-short term rehab (<3 hours/day)    Assistance Recommended at Discharge  Frequent or constant Supervision/Assistance  Functional Status Assessment    Frequency and Duration min 1 x/week  1 week      SLP  Evaluation Cognition  Overall Cognitive Status: Impaired/Different from baseline Arousal/Alertness: Awake/alert Orientation Level: Oriented to person;Disoriented to place;Disoriented to time;Disoriented to situation Attention: Focused Focused Attention: Impaired Focused Attention Impairment: Verbal basic Memory: Impaired Memory Impairment: Decreased short term memory Decreased Short Term Memory: Verbal basic Awareness: Impaired Awareness Impairment: Intellectual impairment Problem Solving: Impaired Problem Solving Impairment: Verbal basic Executive Function: Reasoning Reasoning: Impaired Reasoning Impairment: Verbal basic       Comprehension  Auditory Comprehension Overall Auditory Comprehension: Impaired    Expression Verbal Expression Overall Verbal Expression: Appears within functional limits for tasks assessed   Oral / Motor  Oral Motor/Sensory Function Overall Oral Motor/Sensory Function: Within functional limits Motor Speech Overall Motor Speech: Appears within functional limits for tasks assessed           Oren Barella H. Romie Levee, CCC-SLP Speech Language Pathologist  Georgetta Haber 03/14/2024, 7:21 AM

## 2024-03-14 NOTE — TOC Progression Note (Signed)
 Transition of Care [facility]) - Progression Note    Patient Details  Name: [name] White MRN: [id number] Date of Birth: 18-Nov-1941  Transition of Care [facility]) CM/SW Contact  Leitha Bleak, RN Phone Number: 03/14/2024, 11:50 AM  Clinical Narrative:   Family now want SNF before returning home. Patient lives alone. CM spoke with Misty Stanley, Daughter. First choice is [facility]. FL2 completed and sent out, Insurance authorization started.    Expected Discharge Plan: Skilled Nursing Facility Barriers to Discharge: Insurance Authorization, SNF Pending bed offer  Expected Discharge Plan and Services      Living arrangements for the past 2 months: Single Family Home                     Social Determinants of Health (SDOH) Interventions SDOH Screenings   Food Insecurity: No Food Insecurity (03/12/2024)  Housing: Low Risk  (03/12/2024)  Transportation Needs: No Transportation Needs (03/12/2024)  Utilities: Not At Risk (03/12/2024)  Depression (PHQ2-9): Low Risk  (01/28/2020)  Social Connections: Unknown (03/12/2024)  Tobacco Use: Low Risk  (03/11/2024)    Readmission Risk Interventions    12/27/2023    1:21 PM  Readmission Risk Prevention Plan  Transportation Screening Complete  Home Care Screening Complete  Medication Review (RN CM) Complete

## 2024-03-14 NOTE — Progress Notes (Signed)
 Telestroke Note    2138: Code stroke cart activated at this time for inpatient code stroke activation. Per nursing, LKW 0600. Nursing concerned for aphasia, confusion, inability to follow commands, and an overall decline in patients status. Per nursing patient experiencing a drift in the left arm and right leg. LKW 0600. ROS 1900 per nursing. mRS 4.   2141: Dr.Adefeso at patients bedside assessing patient. Running code stroke per [facility].   2148: Patient left for CT.   2155: TSP paged.   2203: Dr.Klein on camera. Patient history, report, and updates provided to Dr.Klein. Dr.Klein also reviewed Code stroke imaging. Per Dr.Klein, no advanced imaging required at this time.  2210: Patient returned from CT.   2211: Dr.Klein performing neuro exam.   2220: Dr.Klein discussing CT imaging results with nursing. No further needs from telestroke nurse per Dr.Klein. Logged off stroke cart at this time.     Derrill Kay Telestroke RN

## 2024-03-14 NOTE — Progress Notes (Signed)
 Progress Note   Patient: [name] White ZOX:[id number] DOB: [birth year] DOA: 03/11/2024     3 DOS: the patient was seen and examined on 03/14/2024   Brief hospital admission narrative: As per H&P written by Dr. Arville Care on 03/11/2024 [name] White is a 83 y.o. Caucasian female with medical history significant for anxiety, asthma, depression, type 2 diabetes mellitus, hypertension, dyslipidemia, hypothyroidism and migraine, who presented to the emergency room with acute onset of generalized weakness which has been worsening over the last couple of days with ultimate status and confusion.  Though she was recently treated for UTI.  Though she denies any paresthesias or focal muscle weakness.  No chest pain or palpitations.  She has been having urinary frequency and urgency with dysuria with no hematuria or flank pain.  No fever or chills.  No cough or wheezing or hemoptysis.   ED Course: When the patient came to the ER, vital signs were within normal.  Labs revealed unremarkable CMP and magnesium levels 1.4.  Lactic acid was 1.1.  CBC was within normal.  UA was positive for UTI.  Urine culture was sent. EKG as reviewed by me :  EKG showed normal sinus rhythm with a rate of 77 with short PR interval. Imaging: Two-view chest x-ray showed no acute cardiopulmonary disease. Noncontrast head CT scan and C-spine CT revealed the following: 1. No acute intracranial abnormality. 2. No acute fracture or static subluxation of the cervical spine. 3. Unchanged appearance of suprasellar mass.   Brain MRI without contrast revealed the following: 1. Two distinct 5 mm foci of diffusion signal abnormality involving the posterior left parietal and left periatrial white matter, consistent with small evolving ischemic infarcts, early subacute in appearance. No associated hemorrhage or mass effect. 2. No other acute intracranial abnormality. 3. Age-related cerebral atrophy with moderate chronic microvascular ischemic  disease, with a few scattered remote infarcts involving the hemispheric cerebral white matter and cerebellum. 4. Grossly stable appearance of patient's known sellar/suprasellar  Assessment and Plan: * Acute metabolic encephalopathy/UTI/subacute ischemic stroke - Associated with UTI -Patient with incidental finding for CVA; after discussing with neurology service do not feel patient's subacute stroke contributing to her abnormal mentation. -Thyroid panel within normal limits; B12 deficiency appreciated. -Continue normotension and further risk factors modifications to prevent future strokes. -Following rec's by neurology will use aspirin and Plavix for 21 days with a subsequent transition to the use of Plavix only.  MRA not demonstrating large vessel occlusion. -2D echo with preserved ejection fraction, no wall motion abnormalities and no significant valvular disorder. -Patient with significant cognitive impairment unsafe to go home.  Skilled nursing facility has now been recommended. -Antibiotic has been transitioned to [facility] twice a day to complete therapy.  GERD without esophagitis - Continue PPI. -Lifestyle changes discussed with patient.  Anxiety and depression - Stable mood -No suicidal ideation hallucination -Continue as needed home dose Xanax -Continue home dose Effexor  Type 2 diabetes mellitus with peripheral neuropathy (HCC) - Continue to hold oral hypoglycemic agents while inpatient -recent A1c 6.0 -Continue sliding scale insulin while inpatient. -Modified carbohydrate diet discussed with patient.  Hypothyroidism - Continue Synthroid -TSH within normal limits. -Follow-up thyroid panel in 55-month as an outpatient.  Hyperlipidemia -Continue statin. -Heart healthy diet discussed with patient.  B12 deficiency -B12 level 144; aggressive repletion and maintenance therapy initiated. -Continue to follow B12 trend with repeat levels in 12 weeks.   Subjective:  No  fever, no chest pain, no nausea, no vomiting.  Physical  Exam: Vitals:   03/13/24 2206 03/14/24 0339 03/14/24 0839 03/14/24 1400  BP: 101/67 109/65 121/80 114/60  Pulse: 76 74 73 74  Resp:  18    Temp:  98 F (36.7 C)  (!) 97.5 F (36.4 C)  TempSrc:    Axillary  SpO2:  97%  98%  Weight:      Height:       General exam: Awake and demonstrating intermittent difficulty to understand and follow commands.  There were moments of staring into space.  Muscle strength appears appropriate and no muscular deficits appreciated. Respiratory system: Good saturation on room air. Cardiovascular system: No rubs, no gallops, no JVD.  RRR. Gastrointestinal system: Abdomen is nondistended, soft and nontender. No organomegaly or masses felt. Normal bowel sounds heard. Central nervous system: Poor insight and cognitive impairment appreciated; no muscular deficits seen. Extremities: No cyanosis, clubbing or edema. Skin: No petechiae. Psychiatry: Judgement and insight appear impaired secondary to cognitive deficits.  Stable mood.  Data Reviewed: TSH: 1.364 B12: 144 Lipid panel: Total cholesterol 177, LDL 117, HDL 36 and triglycerides 122 Basic metabolic panel: Sodium 139, potassium 4.3, chloride 110, bicarb 21, BUN 11, creatinine 0.61 GFR >60 CBC: White blood cell 6.1, hemoglobin 12.3 and platelet count 230K. EEG: neg for epilepsy  Family Communication: Son at bedside And daughter updated over the phone.  Disposition: Status is: Inpatient Remains inpatient appropriate because: Continue IV therapy.   Planned Discharge Destination:  SNF  Time spent: 50 minutes  Author: Vassie Loll, MD 03/14/2024 5:08 PM  For on call review [email or web address].

## 2024-03-14 NOTE — Progress Notes (Signed)
 This pt demonstrated difficulties answering NIHSS questions. Pt is alert and oriented to self,  but could only give her first name. Pt demonstrated difficulty with simple instructions given earlier. Pt had periods of small laughter and preferred to sleep.

## 2024-03-15 ENCOUNTER — Inpatient Hospital Stay (HOSPITAL_COMMUNITY)

## 2024-03-15 DIAGNOSIS — I639 Cerebral infarction, unspecified: Secondary | ICD-10-CM | POA: Diagnosis not present

## 2024-03-15 DIAGNOSIS — E1142 Type 2 diabetes mellitus with diabetic polyneuropathy: Secondary | ICD-10-CM | POA: Diagnosis not present

## 2024-03-15 DIAGNOSIS — E039 Hypothyroidism, unspecified: Secondary | ICD-10-CM | POA: Diagnosis not present

## 2024-03-15 DIAGNOSIS — G9340 Encephalopathy, unspecified: Secondary | ICD-10-CM | POA: Diagnosis not present

## 2024-03-15 LAB — GLUCOSE, CAPILLARY
Glucose-Capillary: 76 mg/dL (ref 70–99)
Glucose-Capillary: 160 mg/dL — ABNORMAL HIGH (ref 70–99)
Glucose-Capillary: 81 mg/dL (ref 70–99)
Glucose-Capillary: 82 mg/dL (ref 70–99)
Glucose-Capillary: 129 mg/dL — ABNORMAL HIGH (ref 70–99)

## 2024-03-15 LAB — AMMONIA: Ammonia: 16 umol/L (ref 9–35)

## 2024-03-15 MED ORDER — THIAMINE HCL 100 MG/ML IJ SOLN
500.0000 mg | INTRAVENOUS | Status: AC
Start: 2024-03-15 — End: 2024-03-17
  Administered 2024-03-15 – 2024-03-17 (×3): 500 mg via INTRAVENOUS
  Filled 2024-03-15: qty 2
  Filled 2024-03-15 (×2): qty 5

## 2024-03-15 MED ORDER — DEXTROSE-SODIUM CHLORIDE 5-0.45 % IV SOLN: INTRAVENOUS | Status: AC

## 2024-03-15 MED ORDER — BACLOFEN 10 MG PO TABS
10.0000 mg | ORAL_TABLET | Freq: Every day | ORAL | Status: DC
  Administered 2024-03-17 – 2024-03-18 (×2): 10 mg via ORAL
  Filled 2024-03-15 (×2): qty 1

## 2024-03-15 MED ORDER — DEXTROSE 50 % IV SOLN
25.0000 mL | Freq: Once | INTRAVENOUS | Status: AC
  Administered 2024-03-15: 25 mL via INTRAVENOUS
  Filled 2024-03-15: qty 50

## 2024-03-15 MED ORDER — THIAMINE HCL 100 MG/ML IJ SOLN
INTRAMUSCULAR | Status: AC
Start: 2024-03-15 — End: ?
  Filled 2024-03-15: qty 6

## 2024-03-15 NOTE — TOC Progression Note (Signed)
 Transition of Care [facility]) - Progression Note    Patient Details  Name: [name] White MRN: [id number] Date of Birth: [birth year]  Transition of Care [facility]) CM/SW Contact  Leitha Bleak, RN Phone Number: 03/15/2024, 11:34 AM  Clinical Narrative:   Discussed bed offers with Misty Stanley. She was at [facility] before, they will accept this offer. She would like TOC to check with [facility] on Monday. Patient will be her all weekend and family with discuss palliative/Hospice this weekend. They can see the decline and are not interested in a feeding tube. Insurance [employer] has been started and [employer] added. TOC following.    Expected Discharge Plan: Skilled Nursing Facility Barriers to Discharge: Insurance Authorization, SNF Pending bed offer  Expected Discharge Plan and Services      Living arrangements for the past 2 months: Single Family Home            Social Determinants of Health (SDOH) Interventions SDOH Screenings   Food Insecurity: No Food Insecurity (03/12/2024)  Housing: Low Risk  (03/12/2024)  Transportation Needs: No Transportation Needs (03/12/2024)  Utilities: Not At Risk (03/12/2024)  Depression (PHQ2-9): Low Risk  (01/28/2020)  Social Connections: Unknown (03/12/2024)  Tobacco Use: Low Risk  (03/11/2024)    Readmission Risk Interventions    12/27/2023    1:21 PM  Readmission Risk Prevention Plan  Transportation Screening Complete  Home Care Screening Complete  Medication Review (RN CM) Complete

## 2024-03-15 NOTE — Plan of Care (Addendum)
 This pt continues to display metabolic encephalopathy. Pt does not respond to NIHSS questions and will not open her mouth to ingest meds. Offered pt water, she refuses. CT scan and EEG performed yesterday - no further damage was noted. Charge nurse and MD notified.    Problem: Ischemic Stroke/TIA Tissue Perfusion: Goal: Complications of ischemic stroke/TIA will be minimized Outcome: Progressing   Problem: Metabolic: Goal: Ability to maintain appropriate glucose levels will improve Outcome: Progressing   Problem: Tissue Perfusion: Goal: Adequacy of tissue perfusion will improve Outcome: Progressing   Problem: Clinical Measurements: Goal: Will remain free from infection Outcome: Progressing Goal: Diagnostic test results will improve Outcome: Progressing   Problem: Safety: Goal: Ability to remain free from injury will improve Outcome: Progressing   Problem: Skin Integrity: Goal: Risk for impaired skin integrity will decrease Outcome: Progressing

## 2024-03-15 NOTE — Progress Notes (Signed)
 Critical Note  Rapid response was called around 9:35 PM on 03/14/2024 and code stroke was activated due to confusion, aphasia, inability to follow commands as well as decline in patient's status. CT of head was ordered and showed no acute intracranial abnormality Teleneurology (Dr. Peri Jefferson) consulted suspected that patient's symptoms was possibly due to ongoing metabolic encephalopathy and/or component of hypoactive delirium or catatonia and recommended management of infection and any metabolic derangements as well as follow-up with neurology in the morning. Of note, patient was already being followed by neurologist  Critical time: 37 minutes   Critical care personally provided  managing the patient due to high probability of clinically significant and life threatening deterioration. This critical care time included obtaining a history; examining the patient, pulse oximetry; ordering and review of studies; arranging urgent treatment with development of a management plan; evaluation of patient's response of treatment; frequent reassessment; and discussions with other providers.  This critical care time was performed to assess and manage the high probability of imminent and life threatening deterioration that could result in multi-organ failure.

## 2024-03-15 NOTE — Progress Notes (Addendum)
 Progress Note   Patient: [name] White WUJ:[id number] DOB: Sep 04, 1941 DOA: 03/11/2024     4 DOS: the patient was seen and examined on 03/15/2024   Brief hospital admission narrative: As per H&P written by Dr. Arville Care on 03/11/2024 [name] White is a 83 y.o. Caucasian female with medical history significant for anxiety, asthma, depression, type 2 diabetes mellitus, hypertension, dyslipidemia, hypothyroidism and migraine, who presented to the emergency room with acute onset of generalized weakness which has been worsening over the last couple of days with ultimate status and confusion.  Though she was recently treated for UTI.  Though she denies any paresthesias or focal muscle weakness.  No chest pain or palpitations.  She has been having urinary frequency and urgency with dysuria with no hematuria or flank pain.  No fever or chills.  No cough or wheezing or hemoptysis.   ED Course: When the patient came to the ER, vital signs were within normal.  Labs revealed unremarkable CMP and magnesium levels 1.4.  Lactic acid was 1.1.  CBC was within normal.  UA was positive for UTI.  Urine culture was sent. EKG as reviewed by me :  EKG showed normal sinus rhythm with a rate of 77 with short PR interval. Imaging: Two-view chest x-ray showed no acute cardiopulmonary disease. Noncontrast head CT scan and C-spine CT revealed the following: 1. No acute intracranial abnormality. 2. No acute fracture or static subluxation of the cervical spine. 3. Unchanged appearance of suprasellar mass.   Brain MRI without contrast revealed the following: 1. Two distinct 5 mm foci of diffusion signal abnormality involving the posterior left parietal and left periatrial white matter, consistent with small evolving ischemic infarcts, early subacute in appearance. No associated hemorrhage or mass effect. 2. No other acute intracranial abnormality. 3. Age-related cerebral atrophy with moderate chronic microvascular ischemic  disease, with a few scattered remote infarcts involving the hemispheric cerebral white matter and cerebellum. 4. Grossly stable appearance of patient's known sellar/suprasellar  Assessment and Plan: * Acute metabolic encephalopathy/UTI/subacute ischemic stroke - Associated with UTI -Patient with incidental finding for CVA; after discussing with neurology service do not feel patient's subacute stroke contributing to her abnormal mentation. -Thyroid panel within normal limits; B12 deficiency appreciated. -Continue normotension and further risk factors modifications to prevent future strokes. -Following rec's by neurology will use aspirin and Plavix for 21 days with a subsequent transition to the use of Plavix only.  MRA not demonstrating large vessel occlusion. -2D echo with preserved ejection fraction, no wall motion abnormalities and no significant valvular disorder. -Patient with significant cognitive impairment unsafe to go home.  Skilled nursing facility has now been recommended. -Antibiotic has been transitioned to [facility] twice a day to complete therapy. -Given worsening acute cognitive impairment thiamine level and ammonia level has been requested; high-dose thiamine started after blood work collection; also repeat MRI of the brain has been ordered.  Repeat CT scan on 03/14/2024 demonstrated no acute hemorrhagic changes. -After discussing with neurology service (Dr. Petra Kuba) will minimize sedative agents and specifically decrease patient's Bacoflen dose.  GERD without esophagitis - Continue PPI. -Lifestyle changes discussed with patient.  Anxiety and depression - Stable mood -No suicidal ideation hallucination -Continue as needed home dose Xanax -Continue home dose Effexor  Type 2 diabetes mellitus with peripheral neuropathy (HCC) - Continue to hold oral hypoglycemic agents while inpatient -recent A1c 6.0 -Continue sliding scale insulin while inpatient. -Modified carbohydrate  diet discussed with patient.  Hypothyroidism - Continue Synthroid -TSH within normal  limits. -Follow-up thyroid panel in 63-month as an outpatient.  Hyperlipidemia -Continue statin. -Heart healthy diet discussed with patient.  B12 deficiency -B12 level 144; aggressive repletion and maintenance therapy initiated. -Continue to follow B12 trend with repeat levels in 12 weeks.   Subjective:  No fever, no nausea, no vomiting, no shortness of breath.  Demonstrating significant difficulty following commands and nonverbal.  Physical Exam: Vitals:   03/15/24 0411 03/15/24 0740 03/15/24 1246 03/15/24 1402  BP: 118/69 131/84 131/84 115/77  Pulse: 76 78 78 86  Resp: 18   20  Temp: 97.9 F (36.6 C)   98.5 F (36.9 C)  TempSrc:    Axillary  SpO2: 95% 95%  94%  Weight:      Height:       General exam: Alert, awake, the patient nonverbal, without significant poor insight and unable to follow commands. Respiratory system: Clear to auscultation. Respiratory effort normal. Cardiovascular system:RRR. No murmurs, rubs, gallops. Gastrointestinal system: Abdomen is nondistended, soft and nontender. No organomegaly or masses felt. Normal bowel sounds heard. Central nervous system: Preserved muscular strength appreciated; significant cognitive impairment and at this point nonverbal. Extremities: No cyanosis or clubbing. Skin: No petechiae. Psychiatry: Judgement and insight appear impaired secondary to acute encephalopathic process.  Data Reviewed: TSH: 1.364 B12: 144 Lipid panel: Total cholesterol 177, LDL 117, HDL 36 and triglycerides 122 Basic metabolic panel: Sodium 139, potassium 4.3, chloride 110, bicarb 21, BUN 11, creatinine 0.61 GFR >60 CBC: White blood cell 6.1, hemoglobin 12.3 and platelet count 230K. EEG: neg for epilepsy Repeat CT scan of the head (03/14/2024): No acute intracranial normalities; atrophy with chronic small vessel ischemic disease with a few small remote bilateral  cerebellar infarcts. -Repeat MRI:: pending  Family Communication: Son and daughter at bedside. Disposition: Status is: Inpatient Remains inpatient appropriate because: Continue IV therapy.   Planned Discharge Destination:  SNF  Time spent: 50 minutes  Author: Vassie Loll, MD 03/15/2024 5:44 PM  For on call review [email or web address].

## 2024-03-15 NOTE — Progress Notes (Signed)
 NIH stroke scale attempted multiple times and unsuccessful due to pt unable to participate and follow commands.

## 2024-03-15 NOTE — Progress Notes (Signed)
 NT attempted to feed pt while family was bedside. Pt kept pushing the food away and moving her head away from the spoon. Family asked NT to try again later to feed pt. MD messaged regarding order for fluid per family request.

## 2024-03-15 NOTE — Evaluation (Signed)
 Clinical/Bedside Swallow Evaluation Patient Details  Name: [name] White MRN: [id number] Date of Birth: 1941-07-23  Today's Date: 03/15/2024 Time: SLP Start Time (ACUTE ONLY): 1145 SLP Stop Time (ACUTE ONLY): 1230 SLP Time Calculation (min) (ACUTE ONLY): 45 min  Past Medical History:  Past Medical History:  Diagnosis Date   Anxiety    Asthma    ASYMPTOMATIC POSTMENOPAUSAL STATUS 02/09/2009   Cataract    Constipation 03/19/2013   Depression    DIABETES MELLITUS, TYPE II 09/14/2007   HYPERCHOLESTEROLEMIA 02/09/2009   HYPERTENSION 02/09/2009   HYPOTHYROIDISM 09/14/2007   MIGRAINE HEADACHE 09/14/2007   Neuropathy    OSTEOPOROSIS 02/09/2009   PANCREATITIS 09/14/2007   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   PONV (postoperative nausea and vomiting)    Rectocele 03/19/2013   Shortness of breath    SUPERFICIAL PHLEBITIS 09/14/2007   Varicose veins    Past Surgical History:  Past Surgical History:  Procedure Laterality Date   ABDOMINAL HYSTERECTOMY     ANTERIOR AND POSTERIOR REPAIR N/A 04/02/2013   Procedure: ANTERIOR (CYSTOCELE) AND POSTERIOR REPAIR (RECTOCELE);  Surgeon: Tilda Burrow, MD;  Location: [facility];  Service: Gynecology;  Laterality: N/A;   APPENDECTOMY     BRAIN SURGERY     CATARACT EXTRACTION W/PHACO  12/05/2011   Procedure: CATARACT EXTRACTION PHACO AND INTRAOCULAR LENS PLACEMENT (IOC);  Surgeon: Gemma Payor;  Location: [facility];  Service: Ophthalmology;  Laterality: Left;  CDE:9.65   CHOLECYSTECTOMY     CRANIOTOMY N/A 06/04/2020   Procedure: Endoscopic Transphenoidal Resection of Recurrent PituitaryTumor;  Surgeon: Barnett Abu, MD;  Location: [facility];  Service: Neurosurgery;  Laterality: N/A;   CYSTOSCOPY     ENDOVENOUS ABLATION SAPHENOUS VEIN W/ LASER  11-03-2011   right greater saphenous vein   left leg done 10-2011   EYE SURGERY  98   right cataract extraction 98   LAPAROSCOPIC NISSEN FUNDOPLICATION     NM ESOPHAGEAL REFLUX  08-11-11   PITUITARY EXCISION  10/1997   POSTERIOR  REPAIR     TRANSNASAL APPROACH N/A 06/04/2020   Procedure: TRANSNASAL APPROACH;  Surgeon: Osborn Coho, MD;  Location: [facility] OR;  Service: ENT;  Laterality: N/A;   TRANSPHENOIDAL / TRANSNASAL HYPOPHYSECTOMY / RESECTION PITUITARY TUMOR  08-11-11   HPI:  [name] White is a 83 y.o. Caucasian female with medical history significant for anxiety, asthma, depression, type 2 diabetes mellitus, hypertension, dyslipidemia, hypothyroidism and migraine, who presented to the emergency room with acute onset of generalized weakness which has been worsening over the last couple of days with ultimate status and confusion.  Though she was recently treated for UTI. MRI Reveals "Two distinct 5 mm foci of diffusion signal abnormality involving  the posterior left parietal and left periatrial white matter,  consistent with small evolving ischemic infarcts, early subacute in  appearance" SLE requested. Pt has experienced a significant change since Wednesday and began refusing PO; BSE requested.    Assessment / Plan / Recommendation  Clinical Impression  Clinical swallowing evaluation requested after reports of Pt refusing all PO. Note SLE was completed on Wednesday 03/13/24 Pt demonstrated verbalizations and answered some questions; today no verbal responses despite max cues and showing pictures of her dog (Gizmo) and pictures of her grandchildren. Note flat affect with no smiling or response despite various strategies attempted. Pt unable to say her name or any reflexive response.   SLP provided po trials and with verbal support and cues did accept ice chips followed by tsp sips and straw sips of water -  small amounts but no overt s/sx of aspiration were noted. Pt then accepted peaches and chewed and swallowed a small portion of a cracker without overt s/sx of aspiration. Pt's hands are tightly clinched and holding to the bed but with cues and SLP hand over hand hand support + placing peaches in Pt's hand and spoon in the  other Pt did hold the peach cup and attempt to feed herself a few bites successfully. Recommend initiated D2/fine chop diet and continue with thin liquids. Pt will require 1:1 feeder for meals. Recommend meds be administered crushed in puree. ST will continue to follow acutely SLP Visit Diagnosis: Cognitive communication deficit (R41.841);Dysphagia, oropharyngeal phase (R13.12)    Aspiration Risk  Risk for inadequate nutrition/hydration    Diet Recommendation Dysphagia 2 (Fine chop);Thin liquid    Liquid Administration via: Cup;Straw Medication Administration: Crushed with puree Supervision: Full supervision/cueing for compensatory strategies;Staff to assist with self feeding Compensations: Slow rate;Small sips/bites Postural Changes: Seated upright at 90 degrees    Other  Recommendations Oral Care Recommendations: Oral care BID    Recommendations for follow up therapy are one component of a multi-disciplinary discharge planning process, led by the attending physician.  Recommendations may be updated based on patient status, additional functional criteria and insurance authorization.  Follow up Recommendations Skilled nursing-short term rehab (<3 hours/day)            Frequency and Duration min 2x/week  1 week       Prognosis Prognosis for improved oropharyngeal function: Fair Barriers to Reach Goals: Severity of deficits      Swallow Study   General HPI: [name] White is a 83 y.o. Caucasian female with medical history significant for anxiety, asthma, depression, type 2 diabetes mellitus, hypertension, dyslipidemia, hypothyroidism and migraine, who presented to the emergency room with acute onset of generalized weakness which has been worsening over the last couple of days with ultimate status and confusion.  Though she was recently treated for UTI. MRI Reveals "Two distinct 5 mm foci of diffusion signal abnormality involving  the posterior left parietal and left periatrial white  matter,  consistent with small evolving ischemic infarcts, early subacute in  appearance" SLE requested. Pt has experienced a significant change since Wednesday and began refusing PO; BSE requested. Type of Study: Bedside Swallow Evaluation Previous Swallow Assessment: none in chart Diet Prior to this Study: Regular;Thin liquids (Level 0) Temperature Spikes Noted: No Respiratory Status: Room air History of Recent Intubation: No Behavior/Cognition: Alert;Doesn't follow directions;Confused;Other (Comment) Oral Cavity Assessment: Within Functional Limits Oral Care Completed by SLP: Recent completion by staff Oral Cavity - Dentition: Adequate natural dentition Vision: Functional for self-feeding Self-Feeding Abilities: Able to feed self Patient Positioning: Upright in bed Baseline Vocal Quality: Normal Volitional Cough: Cognitively unable to elicit Volitional Swallow: Unable to elicit    Oral/Motor/Sensory Function Overall Oral Motor/Sensory Function: Within functional limits   Ice Chips Ice chips: Within functional limits   Thin Liquid Thin Liquid: Within functional limits    Nectar Thick Nectar Thick Liquid: Not tested   Honey Thick Honey Thick Liquid: Not tested   Puree Puree: Within functional limits   Solid     Solid: Within functional limits     Emmilyn Crooke H. Romie Levee, CCC-SLP Speech Language Pathologist  Georgetta Haber 03/15/2024,12:47 PM

## 2024-03-15 NOTE — Evaluation (Signed)
 Occupational Therapy Evaluation Patient Details Name: [name] White MRN: [id number] DOB: 02/20/1941 Today's Date: 03/15/2024   History of Present Illness   [name] White is a 83 y.o. Caucasian female with medical history significant for anxiety, asthma, depression, type 2 diabetes mellitus, hypertension, dyslipidemia, hypothyroidism and migraine, who presented to the emergency room with acute onset of generalized weakness which has been worsening over the last couple of days with ultimate status and confusion.  Though she was recently treated for UTI.  Though she denies any paresthesias or focal muscle weakness.  No chest pain or palpitations.  She has been having urinary frequency and urgency with dysuria with no hematuria or flank pain.  No fever or chills.  No cough or wheezing or hemoptysis.     Clinical Impressions Pt does not follow commands consistently and does not answer questions. Mod/max assist for bed mobility, she id able to list legs but is not able to move legs towards edge of bed. Completed sit to stand t/f with mod/max assist, able to stand for 1 minute but unable to march in place- took side steps towards head of bed. Son and sister at bedside throughout session. Pt would benefit from continued OT services.      If plan is discharge home, recommend the following:   A lot of help with walking and/or transfers;A lot of help with bathing/dressing/bathroom;Assistance with cooking/housework;Assistance with feeding;Direct supervision/assist for medications management;Direct supervision/assist for financial management;Assist for transportation;Help with stairs or ramp for entrance     Functional Status Assessment   Patient has had a recent decline in their functional status and demonstrates the ability to make significant improvements in function in a reasonable and predictable amount of time.     Equipment Recommendations   None recommended by OT       Precautions/Restrictions   Precautions Precautions: Fall Recall of Precautions/Restrictions: Impaired Restrictions Weight Bearing Restrictions Per Provider Order: No     Mobility Bed Mobility Overal bed mobility: Needs Assistance Bed Mobility: Supine to Sit     Supine to sit: Mod assist, Max assist     General bed mobility comments: can lift legs but unable to move toawrd EOB    Transfers Overall transfer level: Needs assistance Equipment used: Rolling walker (2 wheels) Transfers: Sit to/from Stand Sit to Stand: Mod assist, Max assist           General transfer comment: repeated VC, slow labored movement      Balance Overall balance assessment: Needs assistance Sitting-balance support: Feet supported, No upper extremity supported Sitting balance-Leahy Scale: Fair Sitting balance - Comments: fair/good seated at EOB   Standing balance support: Reliant on assistive device for balance, During functional activity, Bilateral upper extremity supported Standing balance-Leahy Scale: Poor Standing balance comment: fair/poor using RW                           ADL either performed or assessed with clinical judgement   ADL Overall ADL's : Needs assistance/impaired Eating/Feeding: NPO   Grooming: Maximal assistance;Sitting   Upper Body Bathing: Maximal assistance;Sitting   Lower Body Bathing: Maximal assistance;Sitting/lateral leans   Upper Body Dressing : Maximal assistance;Sitting   Lower Body Dressing: Maximal assistance;Sitting/lateral leans   Toilet Transfer: Maximal assistance;BSC/3in1;Rolling walker (2 wheels)   Toileting- Clothing Manipulation and Hygiene: Maximal assistance;Sitting/lateral lean   Tub/ Shower Transfer: Maximal assistance;Rolling walker (2 wheels);Tub bench   Functional mobility during ADLs: Maximal assistance;Standard walker  Vision Baseline Vision/History: 1 Wears glasses Ability to See in Adequate Light: 0  Adequate Vision Assessment?: No apparent visual deficits     Perception Perception: Not tested       Praxis Praxis: Not tested       Pertinent Vitals/Pain Pain Assessment Pain Assessment: Faces Faces Pain Scale: Hurts a little bit     Extremity/Trunk Assessment Upper Extremity Assessment Upper Extremity Assessment: Generalized weakness   Lower Extremity Assessment Lower Extremity Assessment: Defer to PT evaluation   Cervical / Trunk Assessment Cervical / Trunk Assessment: Kyphotic   Communication Communication Communication: Impaired Factors Affecting Communication:  (non verbal)   Cognition Arousal: Alert Behavior During Therapy: Flat affect Cognition: Cognition impaired   Orientation impairments: Place, Person, Time, Situation (does not answer questions)         OT - Cognition Comments: pt dementia at baseline, does not follow commands consistently                 Following commands: Impaired Following commands impaired: Follows one step commands with increased time, Follows one step commands inconsistently     Cueing  General Comments   Cueing Techniques: Verbal cues;Tactile cues              Home Living Family/patient expects to be discharged to:: Skilled nursing facility Living Arrangements: Alone Available Help at Discharge: Family Type of Home: House Home Access: Level entry     Home Layout: One level     Bathroom Shower/Tub: Producer, television/film/video: Handicapped height     Home Equipment: Agricultural consultant (2 wheels);Rollator (4 wheels);Cane - quad;Cane - single point;Hand held shower head;Shower seat - built in;Grab bars - tub/shower          Prior Functioning/Environment Prior Level of Function : Independent/Modified Independent             Mobility Comments: uses walker for ambulation ADLs Comments: independent ADLs    OT Problem List: Decreased strength;Impaired balance (sitting and/or standing);Decreased  activity tolerance;Impaired vision/perception;Decreased safety awareness   OT Treatment/Interventions: Self-care/ADL training;Therapeutic exercise;Therapeutic activities;Patient/family education      OT Goals(Current goals can be found in the care plan section)   Acute Rehab OT Goals Patient Stated Goal: to go to rehab OT Goal Formulation: With family Time For Goal Achievement: 03/29/24 Potential to Achieve Goals: Fair ADL Goals Pt Will Perform Eating: with min assist;sitting Pt Will Perform Grooming: with min assist;sitting Pt Will Perform Upper Body Dressing: with min assist;sitting Pt Will Transfer to Toilet: with min assist;bedside commode   OT Frequency:  Min 2X/week       AM-PAC OT "6 Clicks" Daily Activity     Outcome Measure Help from another person eating meals?: Total Help from another person taking care of personal grooming?: A Lot Help from another person toileting, which includes using toliet, bedpan, or urinal?: A Lot Help from another person bathing (including washing, rinsing, drying)?: A Lot Help from another person to put on and taking off regular upper body clothing?: A Lot Help from another person to put on and taking off regular lower body clothing?: A Lot 6 Click Score: 11   End of Session Equipment Utilized During Treatment: Rolling walker (2 wheels)  Activity Tolerance: Patient tolerated treatment well Patient left: in bed;with call bell/phone within reach;with bed alarm set;with family/visitor present  OT Visit Diagnosis: Unsteadiness on feet (R26.81);Muscle weakness (generalized) (M62.81)  Time: 1008-1030 OT Time Calculation (min): 22 min Charges:  OT General Charges $OT Visit: 1 Visit OT Evaluation $OT Eval Low Complexity: 1 Low    Bevelyn Ngo, OTR/L 03/15/2024, 12:01 PM

## 2024-03-15 NOTE — Plan of Care (Signed)
  Problem: Acute Rehab OT Goals (only OT should resolve) Goal: Pt. Will Perform Eating Flowsheets (Taken 03/15/2024 1159) Pt Will Perform Eating:  with min assist  sitting Goal: Pt. Will Perform Grooming Flowsheets (Taken 03/15/2024 1159) Pt Will Perform Grooming:  with min assist  sitting Goal: Pt. Will Perform Upper Body Dressing Flowsheets (Taken 03/15/2024 1159) Pt Will Perform Upper Body Dressing:  with min assist  sitting Goal: Pt. Will Transfer To Toilet Flowsheets (Taken 03/15/2024 1159) Pt Will Transfer to Toilet:  with min assist  bedside commode   Lurena Joiner, OTR/L

## 2024-03-15 NOTE — Care Management Important Message (Signed)
 Important Message  Patient Details  Name: [name] White MRN: [id number] Date of Birth: 13-Feb-1941   Important Message Given:  Yes - Medicare IM     Corey Harold 03/15/2024, 11:11 AM

## 2024-03-16 DIAGNOSIS — G9340 Encephalopathy, unspecified: Secondary | ICD-10-CM | POA: Diagnosis not present

## 2024-03-16 LAB — GLUCOSE, CAPILLARY
Glucose-Capillary: 135 mg/dL — ABNORMAL HIGH (ref 70–99)
Glucose-Capillary: 118 mg/dL — ABNORMAL HIGH (ref 70–99)
Glucose-Capillary: 142 mg/dL — ABNORMAL HIGH (ref 70–99)
Glucose-Capillary: 189 mg/dL — ABNORMAL HIGH (ref 70–99)

## 2024-03-16 MED ORDER — ASPIRIN 300 MG RE SUPP
300.0000 mg | Freq: Every day | RECTAL | Status: DC
  Administered 2024-03-16 – 2024-03-18 (×3): 300 mg via RECTAL
  Filled 2024-03-16 (×3): qty 1

## 2024-03-16 NOTE — Progress Notes (Signed)
 Pt slept well throughout the night, pt is refusing to take any oral meds or drink any fluids. Pt turns her head away when attempting to give fluids, closes mouth shut and refuses to open, and spits out anything that enters her mouth. Mouth care and all oral medications were unable to be given.

## 2024-03-16 NOTE — Progress Notes (Addendum)
 Progress Note  Patient: [name] White MVH:[id number] DOB: 02/13/1941 DOA: 03/11/2024     5 DOS: the patient was seen and examined on 03/16/2024   Brief hospital admission narrative: As per H&P written by Dr. Arville Care on 03/11/2024 [name] White is a 83 y.o. Caucasian female with medical history significant for anxiety, asthma, depression, type 2 diabetes mellitus, hypertension, dyslipidemia, hypothyroidism and migraine, who presented to the emergency room with acute onset of generalized weakness which has been worsening over the last couple of days with ultimate status and confusion.  Though she was recently treated for UTI.  Though she denies any paresthesias or focal muscle weakness.  No chest pain or palpitations.  She has been having urinary frequency and urgency with dysuria with no hematuria or flank pain.  No fever or chills.  No cough or wheezing or hemoptysis.   ED Course: When the patient came to the ER, vital signs were within normal.  Labs revealed unremarkable CMP and magnesium levels 1.4.  Lactic acid was 1.1.  CBC was within normal.  UA was positive for UTI.  Urine culture was sent. EKG as reviewed by me :  EKG showed normal sinus rhythm with a rate of 77 with short PR interval. Imaging: Two-view chest x-ray showed no acute cardiopulmonary disease. Noncontrast head CT scan and C-spine CT revealed the following: 1. No acute intracranial abnormality. 2. No acute fracture or static subluxation of the cervical spine. 3. Unchanged appearance of suprasellar mass.   Brain MRI without contrast revealed the following: 1. Two distinct 5 mm foci of diffusion signal abnormality involving the posterior left parietal and left periatrial white matter, consistent with small evolving ischemic infarcts, early subacute in appearance. No associated hemorrhage or mass effect. 2. No other acute intracranial abnormality. 3. Age-related cerebral atrophy with moderate chronic microvascular ischemic disease,  with a few scattered remote infarcts involving the hemispheric cerebral white matter and cerebellum. 4. 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 -Neurology consult from Dr. Melynda Ripple as well as neurology consult from Dr. Peri Jefferson appreciated, and subsequent input from neurologist Dr. Petra Kuba appreciated -MRA head and neck without LVO or significant stenosis -Aspirin 81 mg daily along with Plavix 75 mg daily for 21 days then after that STOP the  Aspirin and continue ONLY Plavix 75 mg daily indefinitely--for secondary stroke Prevention (Per The multicenter SAMMPRIS trial) -Patient refusing oral medications -will use aspirin suppository for now until oral intake is reliable -Restart atorvastatin when oral intake is reliable -- Speech pathologist recommendations appreciated recommends dysphagia 2 diet (fine chopped) and thin liquids --2D echo with preserved ejection fraction, no wall motion abnormalities and no significant valvular disorder. -- Anticipate discharge to SNF rehab--- awaiting insurance authorization for discharge to SNF rehab  2)Acute metabolic encephalopathy- -suspect related to #1 above -B12 deficiency noted--replacement given -Urine culture without significant growth (treat Rocephin/Duricef, no further antibiotics needed -Thyroid panel within normal limits;   -Thiamine replaced -Patient with further cognitive decline and worsening oral intake -Not very communicative, not very verbal -Avoid sedatives -Baclofen changed  back to 10 mg nightly  3)GERD without esophagitis -Continue Protonix when oral  intake is reliable  4)Anxiety and depression -No suicidal ideation hallucination -Patient seems withdrawn with somewhat depressed affect, refusing medications -Continue as needed home dose Xanax -Continue home dose Effexor and Trintellix when oral intake is more reliable  5)Type 2 diabetes mellitus with peripheral neuropathy (HCC) -recent A1c 6.0--reflecting excellent diabetic control PTA -Oral intake is very poor at this time -Check fingersticks -Hold PTA metformin until oral intake is more reliable  6)Hypothyroidism - Continue Synthroid -TSH within normal limits. -Follow-up thyroid panel in 23-month as an outpatient.  7)B12 deficiency -B12 level 144; aggressive repletion and maintenance therapy initiated. -Continue to follow B12 trend with repeat levels in 12 weeks. -Please see #2 above  8)HTN--stable, continue Coreg  9)FEN--poor oral intake --refusing medications, spitting out food and medications,  recent CVA with cognitive challenges -- Speech pathologist eval appreciated see #1 above -After family conference plan is to continue D5 IV solution through the end of 03/16/24 and then discontinue IV fluids----and allow patient to eat as much as she desires  Subjective:  -Patient's sister Almyra Free), patient's son Mellody Dance, and patient's daughter Misty Stanley all at bedside -- Patient refusing oral intake -Not very communicative at this time -GOC-----refusing medications, spitting out food and medications,  recent CVA with cognitive challenges  -No fevers -- Anticipate discharge to SNF rehab--- awaiting insurance authorization for discharge to SNF rehab  Physical Exam: Vitals:   03/15/24 1246 03/15/24 1402 03/15/24 2047 03/16/24 0427  BP: 131/84 115/77 (!) 143/86 131/83  Pulse: 78 86 80 84  Resp:  20 18 18   Temp:  98.5 F (36.9 C) 97.6 F (36.4 C) 97.9 F (36.6 C)  TempSrc:  Axillary Oral   SpO2:  94%  97% 95%  Weight:      Height:       Physical Exam Gen:- Awake Alert, in no acute distress, not very verbal HEENT:- [place].AT, No sclera icterus Neck-Supple Neck,No JVD,.  Lungs-  CTAB , fair air movement bilaterally  CV- S1, S2 normal, RRR Abd-  +ve B.Sounds, Abd Soft, No tenderness,    Extremity/Skin:- No  edema,   good pedal pulses  Psych-affect is somewhat depressed, not very verbal at this time Neuro-generalized weakness, no new focal deficits, no tremors, verbal and cognitive decline noted, moving all extremities  Family Communication: -Patient's sister Almyra Free), patient's son Mellody Dance, and patient's daughter Misty Stanley  Disposition: SNF rehab---- Anticipate discharge to SNF rehab--- awaiting insurance authorization for discharge to SNF rehab Status is: Inpatient Remains inpatient appropriate because: Continue IV therapy.   Planned Discharge Destination:  SNF  Author: Shon Hale, MD 03/16/2024 10:43 AM  For on call review [email or web address].

## 2024-03-16 NOTE — Plan of Care (Signed)
  Problem: Ischemic Stroke/TIA Tissue Perfusion: Goal: Complications of ischemic stroke/TIA will be minimized Outcome: Progressing   Problem: Clinical Measurements: Goal: Ability to maintain clinical measurements within normal limits will improve Outcome: Progressing   Problem: Skin Integrity: Goal: Risk for impaired skin integrity will decrease Outcome: Progressing

## 2024-03-16 NOTE — Plan of Care (Signed)
  Problem: Ischemic Stroke/TIA Tissue Perfusion: Goal: Complications of ischemic stroke/TIA will be minimized Outcome: Progressing   Problem: Health Behavior/Discharge Planning: Goal: Goals will be collaboratively established with patient/family Outcome: Progressing   Problem: Metabolic: Goal: Ability to maintain appropriate glucose levels will improve Outcome: Progressing   Problem: Tissue Perfusion: Goal: Adequacy of tissue perfusion will improve Outcome: Progressing   Problem: Clinical Measurements: Goal: Ability to maintain clinical measurements within normal limits will improve Outcome: Progressing Goal: Will remain free from infection Outcome: Progressing Goal: Diagnostic test results will improve Outcome: Progressing   Problem: Safety: Goal: Ability to remain free from injury will improve Outcome: Progressing   Problem: Skin Integrity: Goal: Risk for impaired skin integrity will decrease Outcome: Progressing

## 2024-03-16 NOTE — Progress Notes (Addendum)
 Patient refused all PO medications, attempted to give patient a few sips of water patient kept turning her head away and keeping mouth closed shut, refusing to open. Harriett Sine RN attempted to give patient a sip of water and could not drink from a straw and kept turning her face. Patient unable to follow commands and not speaking unable to complete NIH. MD Courage made aware. MD spoke with patient family with this Clinical research associate present.

## 2024-03-16 NOTE — Progress Notes (Signed)
  Interdisciplinary Goals of Care Family Meeting   Date carried out: 03/16/2024  Location of the meeting: Conference room  Member's involved: Physician, Bedside Registered Nurse, and Family Member or next of kin  Durable Power of Attorney or acting medical decision maker:  -Patient's sister [name] White), patient's son [name] White, and patient's daughter [name] White all at bedside -- Patient refusing oral intake -Not very communicative at this time -GOC-----refusing medications, spitting out food and medications,  recent CVA with cognitive challenges     Discussion: We discussed goals of care for [name] .    Code status:   Code Status: Full Code   Disposition: Continue current acute care--- anticipate discharge to SNF rehab  Time spent for the meeting: 38 mins    Shon Hale, MD  03/16/2024, 5:19 PM

## 2024-03-17 DIAGNOSIS — G9340 Encephalopathy, unspecified: Secondary | ICD-10-CM | POA: Diagnosis not present

## 2024-03-17 LAB — GLUCOSE, CAPILLARY
Glucose-Capillary: 132 mg/dL — ABNORMAL HIGH (ref 70–99)
Glucose-Capillary: 135 mg/dL — ABNORMAL HIGH (ref 70–99)
Glucose-Capillary: 119 mg/dL — ABNORMAL HIGH (ref 70–99)

## 2024-03-17 MED ORDER — ENSURE ENLIVE PO LIQD
237.0000 mL | Freq: Two times a day (BID) | ORAL | Status: DC
  Administered 2024-03-18 – 2024-03-19 (×4): 237 mL via ORAL

## 2024-03-17 NOTE — Progress Notes (Signed)
 Progress Note  Patient: [name] White ZOX:[id number] DOB: 18-Sep-1941 DOA: 03/11/2024     6 DOS: the patient was seen and examined on 03/17/2024   Brief hospital admission narrative: As per H&P written by Dr. Arville Care on 03/11/2024 [name] White is a 83 y.o. Caucasian female with medical history significant for anxiety, asthma, depression, type 2 diabetes mellitus, hypertension, dyslipidemia, hypothyroidism and migraine, who presented to the emergency room with acute onset of generalized weakness which has been worsening over the last couple of days with ultimate status and confusion.  Though she was recently treated for UTI.  Though she denies any paresthesias or focal muscle weakness.  No chest pain or palpitations.  She has been having urinary frequency and urgency with dysuria with no hematuria or flank pain.  No fever or chills.  No cough or wheezing or hemoptysis.   ED Course: When the patient came to the ER, vital signs were within normal.  Labs revealed unremarkable CMP and magnesium levels 1.4.  Lactic acid was 1.1.  CBC was within normal.  UA was positive for UTI.  Urine culture was sent. EKG as reviewed by me :  EKG showed normal sinus rhythm with a rate of 77 with short PR interval. Imaging: Two-view chest x-ray showed no acute cardiopulmonary disease. Noncontrast head CT scan and C-spine CT revealed the following: 1. No acute intracranial abnormality. 2. No acute fracture or static subluxation of the cervical spine. 3. Unchanged appearance of suprasellar mass.   Brain MRI without contrast revealed the following: 1. Two distinct 5 mm foci of diffusion signal abnormality involving the posterior left parietal and left periatrial white matter, consistent with small evolving ischemic infarcts, early subacute in appearance. No associated hemorrhage or mass effect. 2. No other acute intracranial abnormality. 3. Age-related cerebral atrophy with moderate chronic microvascular ischemic disease,  with a few scattered remote infarcts involving the hemispheric cerebral white matter and cerebellum. 4. 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 -Neurology consult from Dr. Melynda Ripple as well as neurology consult from Dr. Peri Jefferson appreciated, and subsequent input from neurologist Dr. Petra Kuba appreciated -MRA head and neck without LVO or significant stenosis -Aspirin 81 mg daily along with Plavix 75 mg daily for 21 days then after that STOP the  Aspirin and continue ONLY Plavix 75 mg daily indefinitely--for secondary stroke Prevention (Per The multicenter SAMMPRIS trial) -Patient refusing oral medications -will use aspirin suppository for now until oral intake is reliable -Restart atorvastatin when oral intake is reliable -- Speech pathologist recommendations appreciated recommends dysphagia 2 diet (fine chopped) and thin liquids --2D echo with preserved ejection fraction, no wall motion abnormalities and no significant valvular disorder. -- Anticipate discharge to SNF rehab--- awaiting insurance authorization for discharge to SNF rehab  2)Acute metabolic encephalopathy- -suspect related to #1 above -B12 deficiency noted--replacement given -Urine culture without significant growth (treated with Rocephin/Duricef, no further antibiotics needed -Thyroid panel within normal limits;   -Thiamine replaced -More interactive -Avoid sedatives -Baclofen changed back to 10 mg nightly  3)GERD without esophagitis -Continue Protonix when oral  intake is reliable  4)Anxiety and depression -No suicidal ideation hallucination -More  interactive -Continue as needed home dose Xanax -Continue home dose Effexor and Trintellix when oral intake is more reliable  5)Type 2 diabetes mellitus with peripheral neuropathy (HCC) -recent A1c 6.0--reflecting excellent diabetic control PTA -Check fingersticks -Hold PTA metformin   6)Hypothyroidism - Continue Synthroid -TSH within normal limits. -Follow-up thyroid panel in 83-month as an outpatient.  7)B12 deficiency -B12 level 144; aggressive repletion and maintenance therapy initiated. -Continue to follow B12 trend with repeat levels in 12 weeks. -Please see #2 above  8)HTN--stable, continue Coreg  9)FEN--- oral intake improving somewhat -- recent CVA with cognitive challenges -- Speech pathologist eval appreciated see #1 above -No further IV fluids - allow patient to eat as much as she desires  Subjective:  - Family members at bedside -Oral intake improving bilateral upper extremity erythema, without streaking, or fluctuance--?? Prior iv site  -- Anticipate discharge to SNF rehab--- awaiting insurance authorization for discharge to SNF rehab  Physical Exam: Vitals:   03/16/24 1213 03/16/24 1938 03/17/24 0534 03/17/24 1315  BP: 104/85 (!) 141/79 115/76 107/71  Pulse: 84 86 89 84  Resp: 16 16 16 17   Temp: 98 F (36.7 C) 99 F (37.2 C) 100.1 F (37.8 C) 99.1 F (37.3 C)  TempSrc: Axillary Oral Oral Axillary  SpO2: 94% 92% 96% 91%  Weight:      Height:       Physical Exam Gen:- Awake Alert, in no acute distress, not very verbal HEENT:- [place].AT, No sclera icterus Neck-Supple Neck,No JVD,.  Lungs-  CTAB , fair air movement bilaterally  CV- S1, S2 normal, RRR Abd-  +ve B.Sounds, Abd Soft, No tenderness,    Extremity/Skin:- No  edema,   good pedal pulses , bilateral upper extremity erythema, without streaking, or fluctuance Psych-affect is flat, able to answer simple  questions neuro-generalized weakness, no new focal deficits, no tremors, verbal and cognitive decline noted, moving all extremities  Family Communication: -Patient's sister Almyra Free), patient's son Mellody Dance, and patient's daughter Misty Stanley  Disposition: SNF rehab---- Anticipate discharge to SNF rehab--- awaiting insurance authorization for discharge to SNF rehab Status is: Inpatient Remains inpatient appropriate because: Continue IV therapy.   Planned Discharge Destination:  SNF  Author: Shon Hale, MD 03/17/2024 6:35 PM  For on call review [email or web address].

## 2024-03-17 NOTE — Progress Notes (Signed)
 Noted patient's left wrist also has red rash, with some redness up arm, not as extensive as right. Notified Dr. Marisa Severin.

## 2024-03-17 NOTE — Progress Notes (Signed)
 Patient tolerated small sip of water on a spoon; continued to spoon water to patient. Patient was able to in take 1/2 cup of water. Provided yogurt, ate almost entire cup, small bites at a time. Took meds crushed/sprinkled in applesauce, again very tiny bites. Patient had BM, cleansed and turned. Family at bedside.

## 2024-03-17 NOTE — Plan of Care (Signed)
  Problem: Ischemic Stroke/TIA Tissue Perfusion: Goal: Complications of ischemic stroke/TIA will be minimized Outcome: Progressing   Problem: Health Behavior/Discharge Planning: Goal: Ability to manage health-related needs will improve Outcome: Progressing Goal: Goals will be collaboratively established with patient/family Outcome: Progressing   Problem: Health Behavior/Discharge Planning: Goal: Ability to manage health-related needs will improve Outcome: Progressing   Problem: Metabolic: Goal: Ability to maintain appropriate glucose levels will improve Outcome: Progressing   Problem: Tissue Perfusion: Goal: Adequacy of tissue perfusion will improve Outcome: Progressing   Problem: Clinical Measurements: Goal: Ability to maintain clinical measurements within normal limits will improve Outcome: Progressing Goal: Will remain free from infection Outcome: Progressing Goal: Diagnostic test results will improve Outcome: Progressing   Problem: Safety: Goal: Ability to remain free from injury will improve Outcome: Progressing   Problem: Skin Integrity: Goal: Risk for impaired skin integrity will decrease Outcome: Progressing

## 2024-03-17 NOTE — Progress Notes (Signed)
 Patient's CNA stated to nurse that during patient's bath, she spoke. This writer went in to see patient and if we could get more out of her. Noticed patient's right wrist appeared very red, and the redness extended up her arm. Patient has had nothing running through the IV this shift; and patient's arm was not reddened upon earlier assessment. Notified Dr. Marisa Severin and removed site.

## 2024-03-17 NOTE — Plan of Care (Signed)
  Problem: Ischemic Stroke/TIA Tissue Perfusion: Goal: Complications of ischemic stroke/TIA will be minimized Outcome: Progressing   Problem: Health Behavior/Discharge Planning: Goal: Goals will be collaboratively established with patient/family Outcome: Progressing   Problem: Metabolic: Goal: Ability to maintain appropriate glucose levels will improve Outcome: Progressing   Problem: Tissue Perfusion: Goal: Adequacy of tissue perfusion will improve Outcome: Progressing   Problem: Clinical Measurements: Goal: Ability to maintain clinical measurements within normal limits will improve Outcome: Progressing Goal: Will remain free from infection Outcome: Progressing Goal: Diagnostic test results will improve Outcome: Progressing   Problem: Skin Integrity: Goal: Risk for impaired skin integrity will decrease Outcome: Progressing

## 2024-03-18 ENCOUNTER — Encounter (HOSPITAL_COMMUNITY): Payer: Self-pay | Admitting: Family Medicine

## 2024-03-18 DIAGNOSIS — Z7189 Other specified counseling: Secondary | ICD-10-CM

## 2024-03-18 DIAGNOSIS — Z515 Encounter for palliative care: Secondary | ICD-10-CM | POA: Diagnosis not present

## 2024-03-18 DIAGNOSIS — G9340 Encephalopathy, unspecified: Secondary | ICD-10-CM | POA: Diagnosis not present

## 2024-03-18 LAB — GLUCOSE, CAPILLARY
Glucose-Capillary: 100 mg/dL — ABNORMAL HIGH (ref 70–99)
Glucose-Capillary: 117 mg/dL — ABNORMAL HIGH (ref 70–99)
Glucose-Capillary: 131 mg/dL — ABNORMAL HIGH (ref 70–99)
Glucose-Capillary: 136 mg/dL — ABNORMAL HIGH (ref 70–99)

## 2024-03-18 LAB — VITAMIN B1: Vitamin B1 (Thiamine): 123.1 nmol/L (ref 66.5–200.0)

## 2024-03-18 MED ORDER — ASPIRIN 81 MG PO TBEC
81.0000 mg | DELAYED_RELEASE_TABLET | Freq: Every day | ORAL | Status: DC
  Administered 2024-03-19: 81 mg via ORAL
  Filled 2024-03-18: qty 1

## 2024-03-18 MED ORDER — LACTATED RINGERS IV BOLUS
500.0000 mL | Freq: Once | INTRAVENOUS | Status: AC
  Administered 2024-03-18: 500 mL via INTRAVENOUS

## 2024-03-18 NOTE — Consult Note (Signed)
 Consultation Note Date: 03/18/2024   Patient Name: [name] White  DOB: May 18, 1941  MRN: [id number]  Age / Sex: 83 y.o., female  PCP: Benita Stabile, MD Referring Physician: Shon Hale, MD  Reason for Consultation: Establishing goals of care  HPI/Patient Profile: 83 y.o. female  with past medical history of anxiety/depression, DM2, HTN/HLD, asthma, hypothyroid, migraine, osteoporosis, pancreatitis 2008, rectocele 2014 with surgical repair, history of brain surgery with endoscopic transsphenoidal resection of recurrent pituitary tumor 2021 originally done in 1998 admitted on 03/11/2024 with acute/early subacute stroke with acute metabolic encephalopathy.   Clinical Assessment and Goals of Care: I have reviewed medical records including EPIC notes, labs and imaging, received report from RN, assessed the patient.  Mrs. Basilia Jumbo is lying quietly in bed.  She greets me, making and somewhat keeping eye contact.  She appears acutely/chronically ill and somewhat frail.  She is able to tell me her name only.  Her sister, Almyra Free is present at bedside along with bedside nursing staff.  Libby calls Mrs. Messerschmidt's daughter, Misty Stanley, to join our conversation.    We needed the bedside to discuss diagnosis prognosis, GOC, EOL wishes, disposition and options.  I introduced Palliative Medicine as specialized medical care for people living with serious illness. It focuses on providing relief from the symptoms and stress of a serious illness. The goal is to improve quality of life for both the patient and the family.  We then focused on their current illness.  We talked about acute stroke and improvement over the last few days.  We talked about short-term rehab with the expectation of time for outcomes.  Daughter and sister state that patient would not want attempted resuscitation and no artificial feeding.  Misty Stanley states that she and her  siblings discussed this when their mother was having difficulty swallowing post stroke.  The natural disease trajectory and expectations at EOL were discussed.  Advanced directives, concepts specific to code status, artifical feeding and hydration, and rehospitalization were considered and discussed.  We talked about the concept of "treat the treatable, but allow a natural passing".  Misty Stanley states that she and her siblings have discussed CODE STATUS and they are endorsing DNR.  She shares that she understands her mother had requested attempted resuscitation upon admit, but since she has been found to have an acute stroke with these declines they are electing DNR.  Orders adjusted.  DNR/goldenrod form completed and placed on chart.  Discussed the importance of continued conversation with family and the medical providers regarding overall plan of care and treatment options, ensuring decisions are within the context of the patient's values and GOCs.  Questions and concerns were addressed.   The family was encouraged to call with questions or concerns.  PMT will continue to support holistically.  Conference with attending, bedside nursing staff, transition of care team related to patient condition, needs, goals of care, disposition.     HCPOA HCPOA -daughter, Merlyn Lot.    SUMMARY OF RECOMMENDATIONS   Continue to treat the treatable but  no CPR or intubation Would not accept artificial feeding. Agreeable to short-term rehab, preference [facility] Time for outcomes   Code Status/Advance Care Planning: DNR -verified with daughter.  No artificial feeding.  Goldenrod form completed and placed on chart.  Symptom Management:  Per hospitalist, no additional needs at this time.  Palliative Prophylaxis:  Frequent Pain Assessment, Oral Care, and Turn Reposition  Additional Recommendations (Limitations, Scope, Preferences): Continue to treat the treatable but no CPR or intubation.  Psycho-social/Spiritual:   Desire for further Chaplaincy support:no Additional Recommendations: Caregiving  Support/Resources  Prognosis:  Unable to determine, based on outcomes.  Guarded at this time due to acute stroke.  Discharge Planning: Seeking short-term rehab, considering outpatient palliative      Primary Diagnoses: Present on Admission:  Acute encephalopathy  Hypothyroidism   I have reviewed the medical record, interviewed the patient and family, and examined the patient. The following aspects are pertinent.  Past Medical History:  Diagnosis Date   Anxiety    Asthma    ASYMPTOMATIC POSTMENOPAUSAL STATUS 02/09/2009   Cataract    Constipation 03/19/2013   Depression    DIABETES MELLITUS, TYPE II 09/14/2007   HYPERCHOLESTEROLEMIA 02/09/2009   HYPERTENSION 02/09/2009   HYPOTHYROIDISM 09/14/2007   MIGRAINE HEADACHE 09/14/2007   Neuropathy    OSTEOPOROSIS 02/09/2009   PANCREATITIS 09/14/2007   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   PONV (postoperative nausea and vomiting)    Rectocele 03/19/2013   Shortness of breath    SUPERFICIAL PHLEBITIS 09/14/2007   Varicose veins    Social History   Socioeconomic History   Marital status: Married    Spouse name: Not on file   Number of children: Not on file   Years of education: Not on file   Highest education level: Not on file  Occupational History   Occupation: Retired    Associate Professor: RETIRED  Tobacco Use   Smoking status: Never   Smokeless tobacco: Never  Vaping Use   Vaping status: Never Used  Substance and Sexual Activity   Alcohol use: No    Alcohol/week: 0.0 standard drinks of alcohol   Drug use: No   Sexual activity: Not Currently    Birth control/protection: Surgical  Other Topics Concern   Not on file  Social History Narrative   Not on file   Social Drivers of Health   Financial Resource Strain: Not on file  Food Insecurity: No Food Insecurity (03/12/2024)   Hunger Vital Sign    Worried About Running Out of Food in the Last Year: Never  true    Ran Out of Food in the Last Year: Never true  Transportation Needs: No Transportation Needs (03/12/2024)   PRAPARE - Administrator, Civil Service (Medical): No    Lack of Transportation (Non-Medical): No  Physical Activity: Not on file  Stress: Not on file  Social Connections: Unknown (03/12/2024)   Social Connection and Isolation Panel [NHANES]    Frequency of Communication with Friends and Family: More than three times a week    Frequency of Social Gatherings with Friends and Family: Once a week    Attends Religious Services: Never    Database administrator or Organizations: No    Attends Engineer, structural: Never    Marital Status: Patient declined   Family History  Problem Relation Age of Onset   Heart disease Mother    Asthma Mother    COPD Father    Heart disease Father    Stroke Father  Asthma Daughter    Migraines Daughter    Neuropathy Son    Cancer Neg Hx    Anesthesia problems Neg Hx    Hypotension Neg Hx    Malignant hyperthermia Neg Hx    Pseudochol deficiency Neg Hx    Scheduled Meds:  aspirin  300 mg Rectal Daily   atorvastatin  40 mg Oral Daily   baclofen  10 mg Oral QHS   carvedilol  3.125 mg Oral BID   clopidogrel  75 mg Oral Daily   cycloSPORINE  1 drop Both Eyes BID   docusate sodium  100 mg Oral BID   enoxaparin (LOVENOX) injection  40 mg Subcutaneous Q24H   feeding supplement  237 mL Oral BID BM   levothyroxine  50 mcg Oral Q0600   mirabegron ER  25 mg Oral Daily   multivitamin with minerals  1 tablet Oral Daily   pantoprazole  40 mg Oral Daily   venlafaxine XR  150 mg Oral BH-q7a   vortioxetine HBr  10 mg Oral Daily   Continuous Infusions: PRN Meds:.acetaminophen **OR** acetaminophen (TYLENOL) oral liquid 160 mg/5 mL **OR** acetaminophen, albuterol, ALPRAZolam, furosemide, iohexol, ondansetron (ZOFRAN) IV, senna-docusate, traZODone Medications Prior to Admission:  Prior to Admission medications   Medication  Sig Start Date End Date Taking? Authorizing Provider  acetaminophen (TYLENOL) 325 MG tablet Take 2 tablets (650 mg total) by mouth every 6 (six) hours as needed for mild pain (pain score 1-3) (or Fever >/= 101). 01/01/24  Yes Shahmehdi, Seyed A, MD  albuterol (PROVENTIL HFA;VENTOLIN HFA) 108 (90 BASE) MCG/ACT inhaler Inhale 2 puffs into the lungs every 4 (four) hours as needed for wheezing or shortness of breath.   Yes [provider]  ALPRAZolam (XANAX) 0.25 MG tablet Take 1 tablet (0.25 mg total) by mouth daily as needed. 01/02/24  Yes Shahmehdi, Seyed A, MD  baclofen (LIORESAL) 10 MG tablet Take 10 mg by mouth at bedtime. 02/14/24  Yes [provider]  carvedilol (COREG) 3.125 MG tablet TAKE ONE TABLET BY MOUTH TWICE A DAY 09/25/23  Yes Branch, Dorothe Pea, MD  Chlorphen-Pseudoephed-APAP (CORICIDIN D PO) Take 1 Dose by mouth 2 (two) times daily as needed (cold/cough).   Yes [provider]  CRANBERRY-D MANNOSE PO Take 1 tablet by mouth daily.   Yes [provider]  cycloSPORINE (RESTASIS) 0.05 % ophthalmic emulsion Place 1 drop into both eyes 2 (two) times daily.   Yes [provider]  estradiol (ESTRACE) 0.1 MG/GM vaginal cream Place 1 Applicatorful vaginally 2 (two) times a week. 02/28/24  Yes [provider]  fluticasone furoate-vilanterol (BREO ELLIPTA) 200-25 MCG/ACT AEPB Inhale 1 puff into the lungs daily.   Yes [provider]  furosemide (LASIX) 20 MG tablet May take Lasix 20 mg as NEEDED for leg swelling 03/13/15  Yes Branch, Dorothe Pea, MD  levothyroxine (SYNTHROID) 50 MCG tablet Take 50 mcg by mouth daily.   Yes [provider]  metFORMIN (GLUCOPHAGE) 500 MG tablet Take 1 tablet (500 mg total) by mouth 2 (two) times daily with a meal. Patient taking differently: Take 1,000 mg by mouth daily. 01/01/24  Yes Shahmehdi, Gemma Payor, MD  Multiple Vitamins-Minerals (MULTIVITAMINS THER. W/MINERALS) TABS Take 1 tablet by mouth every  morning.   Yes [provider]  naproxen sodium (ALEVE) 220 MG tablet Take 220-440 mg by mouth 2 (two) times daily as needed (Pain).   Yes [provider]  omeprazole (PRILOSEC) 20 MG capsule Take 20 mg by  mouth every morning.   Yes [provider]  pregabalin (LYRICA) 150 MG capsule TAKE ONE CAPSULE (150 MG TOTAL) BY MOUTHTWO TIMES DAILY. 06/27/20  Yes Tilda Burrow, MD  rizatriptan (MAXALT-MLT) 10 MG disintegrating tablet Take 10 mg by mouth as needed for migraine. May repeat in 2 hours if needed   Yes [provider]  senna (SENOKOT) 8.6 MG TABS tablet Take 1 tablet by mouth at bedtime.   Yes [provider]  TRINTELLIX 10 MG TABS tablet Take 10 mg by mouth daily. 03/09/24  Yes [provider]  venlafaxine (EFFEXOR-XR) 150 MG 24 hr capsule Take 150 mg by mouth every morning.   Yes [provider]  Vibegron (GEMTESA) 75 MG TABS Take 75 mg by mouth at bedtime.   Yes [provider]  dapagliflozin propanediol (FARXIGA) 10 MG TABS tablet Take 10 mg by mouth daily. Patient not taking: Reported on 03/15/2024    [provider]   Allergies  Allergen Reactions   Betadine [Povidone Iodine] Hives   Penicillins Rash   Review of Systems  Unable to perform ROS: Acuity of condition    Physical Exam Vitals and nursing note reviewed.  Constitutional:      General: She is not in acute distress.    Appearance: She is ill-appearing.  HENT:     Mouth/Throat:     Mouth: Mucous membranes are moist.  Cardiovascular:     Rate and Rhythm: Normal rate.  Pulmonary:     Effort: Pulmonary effort is normal. No respiratory distress.  Skin:    General: Skin is warm and dry.  Neurological:     Mental Status: She is alert.     Comments: Oriented to self only at this time secondary to acute stroke  Psychiatric:     Comments: Calm and cooperative     Vital Signs: BP 103/60 (BP Location: Left Arm)   Pulse 81   Temp 98.2 F  (36.8 C) (Oral)   Resp 18   Ht 5\' 5"  (1.651 m)   Wt 60.3 kg   SpO2 95%   BMI 22.13 kg/m  Pain Scale: Faces POSS *See Group Information*: 1-Acceptable,Awake and alert Pain Score: Asleep   SpO2: SpO2: 95 % O2 Device:SpO2: 95 % O2 Flow Rate: .   IO: Intake/output summary:  Intake/Output Summary (Last 24 hours) at 03/18/2024 1257 Last data filed at 03/18/2024 0900 Gross per 24 hour  Intake 360 ml  Output 550 ml  Net -190 ml    LBM: Last BM Date : 03/18/24 Baseline Weight: Weight: 62.9 kg Most recent weight: Weight: 60.3 kg     Palliative Assessment/Data:     Time In: 1240 Time Out: 1355   Time Total: 75 minutes  Greater than 50%  of this time was spent counseling and coordinating care related to the above assessment and plan.  Signed by: Katheran Awe, NP   Please contact Palliative Medicine Team phone at [phone] for questions and concerns.  For individual provider: See Loretha Stapler

## 2024-03-18 NOTE — Progress Notes (Signed)
 Progress Note  Patient: [name] White QMV:[id number] DOB: 11/19/1941 DOA: 03/11/2024     7 DOS: the patient was seen and examined on 03/18/2024   Brief hospital admission narrative: As per H&P written by Dr. Arville Care on 03/11/2024 [name] White is a 83 y.o. Caucasian female with medical history significant for anxiety, asthma, depression, type 2 diabetes mellitus, hypertension, dyslipidemia, hypothyroidism and migraine, who presented to the emergency room with acute onset of generalized weakness which has been worsening over the last couple of days with ultimate status and confusion.  Though she was recently treated for UTI.  Though she denies any paresthesias or focal muscle weakness.  No chest pain or palpitations.  She has been having urinary frequency and urgency with dysuria with no hematuria or flank pain.  No fever or chills.  No cough or wheezing or hemoptysis.   ED Course: When the patient came to the ER, vital signs were within normal.  Labs revealed unremarkable CMP and magnesium levels 1.4.  Lactic acid was 1.1.  CBC was within normal.  UA was positive for UTI.  Urine culture was sent. EKG as reviewed by me :  EKG showed normal sinus rhythm with a rate of 77 with short PR interval. Imaging: Two-view chest x-ray showed no acute cardiopulmonary disease. Noncontrast head CT scan and C-spine CT revealed the following: 1. No acute intracranial abnormality. 2. No acute fracture or static subluxation of the cervical spine. 3. Unchanged appearance of suprasellar mass.   Brain MRI without contrast revealed the following: 1. Two distinct 5 mm foci of diffusion signal abnormality involving the posterior left parietal and left periatrial white matter, consistent with small evolving ischemic infarcts, early subacute in appearance. No associated hemorrhage or mass effect. 2. No other acute intracranial abnormality. 3. Age-related cerebral atrophy with moderate chronic microvascular ischemic disease,  with a few scattered remote infarcts involving the hemispheric cerebral white matter and cerebellum. 4. 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 -Neurology consult from Dr. Melynda Ripple as well as neurology consult from Dr. Peri Jefferson appreciated, and subsequent input from neurologist Dr. Petra Kuba appreciated -MRA head and neck without LVO or significant stenosis -Aspirin 81 mg daily along with Plavix 75 mg daily for 21 days then after that STOP the  Aspirin and continue ONLY Plavix 75 mg daily indefinitely--for secondary stroke Prevention (Per The multicenter SAMMPRIS trial) -Patient refusing oral medications -will use aspirin suppository for now until oral intake is reliable -Restart atorvastatin when oral intake is reliable -- Speech pathologist recommendations appreciated recommends dysphagia 2 diet (fine chopped) and thin liquids --2D echo with preserved ejection fraction, no wall motion abnormalities and no significant valvular disorder. -- Anticipate discharge to SNF rehab--- awaiting insurance authorization for discharge to SNF rehab  2)Acute metabolic encephalopathy- -suspect related to #1 above -B12 deficiency noted--replacement given -Urine culture without significant growth (treated with Rocephin/Duricef, no further antibiotics needed -Thyroid panel within normal limits;   -Thiamine replaced -More interactive -Avoid sedatives -Baclofen changed back to 10 mg nightly  3)GERD without esophagitis -Continue Protonix when oral  intake is reliable  4)Anxiety and depression -No suicidal ideation hallucination -More  interactive -Continue as needed home dose Xanax -Continue home dose Effexor and Trintellix when oral intake is more reliable  5)Type 2 diabetes mellitus with peripheral neuropathy (HCC) -recent A1c 6.0--reflecting excellent diabetic control PTA -Check fingersticks -Hold PTA metformin   6)Hypothyroidism - Continue Synthroid -TSH within normal limits. -Follow-up thyroid panel in 8-month as an outpatient.  7)B12 deficiency -B12 level 144; aggressive repletion and maintenance therapy initiated. -Continue to follow B12 trend with repeat levels in 12 weeks. -Please see #2 above  8)HTN--stable, continue Coreg  9)FEN--- oral intake improving   -- recent CVA with cognitive challenges -- Speech pathologist eval appreciated see #1 above -No further IV fluids - allow patient to eat as much as she desires  Subjective:  -Family members at bedside -Oral intake improving -a bit more interactive No fevers   -- Anticipate discharge to SNF rehab--- awaiting insurance authorization for discharge to SNF rehab  Physical Exam: Vitals:   03/17/24 2121 03/18/24 0000 03/18/24 0509 03/18/24 1457  BP: (!) 91/54 91/60 103/60 99/69  Pulse: 92 84 81 79  Resp: 18 16 18 16   Temp: 98.9 F (37.2 C) 99 F (37.2 C) 98.2 F (36.8 C) 99.2 F (37.3 C)  TempSrc: Oral Oral Oral Oral  SpO2: 95% 95% 95% 94%  Weight:      Height:       Physical Exam Gen:- Awake Alert, in no acute distress, not very verbal HEENT:- [place].AT, No sclera icterus Neck-Supple Neck,No JVD,.  Lungs-  CTAB , fair air movement bilaterally  CV- S1, S2 normal, RRR Abd-  +ve B.Sounds, Abd Soft, No tenderness,    Extremity/Skin:- No  edema,   good pedal pulses , bilateral upper extremity erythema, without streaking, or fluctuance--- suspect inflammatory changes from prior IV site Psych-affect is appropriate, cognitive and memory deficits  noted neuro-generalized weakness, no new focal deficits, no tremors,  moving all extremities  Family Communication: -Patient's sister Almyra Free), patient's son Mellody Dance, and patient's daughter Misty Stanley  Disposition: SNF rehab---- Anticipate discharge to SNF rehab--- awaiting insurance authorization for discharge to SNF rehab Status is: Inpatient  Author: Shon Hale, MD 03/18/2024 5:43 PM  For on call review [email or web address].

## 2024-03-18 NOTE — Care Management Important Message (Signed)
 Important Message  Patient Details  Name: [name] White MRN: [id number] Date of Birth: [birth year]   Important Message Given:  Yes - Medicare IM     Corey Harold 03/18/2024, 12:17 PM

## 2024-03-18 NOTE — Progress Notes (Signed)
 Mobility Specialist Progress Note:    03/18/24 1630  Mobility  Activity Transferred from chair to bed  Level of Assistance Maximum assist, patient does 25-49%  Assistive Device None  Distance Ambulated (ft) 2 ft  Range of Motion/Exercises Active;All extremities  Activity Response Tolerated well  Mobility Referral No  Mobility visit 1 Mobility  Mobility Specialist Start Time (ACUTE ONLY) 1625  Mobility Specialist Stop Time (ACUTE ONLY) 1640  Mobility Specialist Time Calculation (min) (ACUTE ONLY) 15 min   Pt received in chair requesting assistance to bed. Required MaxA to stand and transfer with no AD. NT in room for assistance. Tolerated well, asx throughout. Left pt supine, family at bedside. All needs met.   [name] White Mobility Specialist Please contact via Special educational needs teacher or  Rehab office at [phone]

## 2024-03-18 NOTE — Progress Notes (Signed)
 Patient's blood pressure continues to trend soft with the last BP 84/58 (manually). MD made aware. Will continue to monitor.

## 2024-03-18 NOTE — TOC Progression Note (Signed)
 Transition of Care [facility]) - Progression Note    Patient Details  Name: [name] White MRN: [id number] Date of Birth: 15-Jun-1941  Transition of Care [facility]) CM/SW Contact  Elliot Gault, LCSW Phone Number: 03/18/2024, 4:08 PM  Clinical Narrative:     TOC following. Updated pt's daughter on bed offers and answered her questions related to same. Expanded bed search to additional facilities at dtr request.  TOC will follow up in AM.  Expected Discharge Plan: Skilled Nursing Facility Barriers to Discharge: Insurance Authorization, SNF Pending bed offer  Expected Discharge Plan and Services       Living arrangements for the past 2 months: Single Family Home                                       Social Determinants of Health (SDOH) Interventions SDOH Screenings   Food Insecurity: No Food Insecurity (03/12/2024)  Housing: Low Risk  (03/12/2024)  Transportation Needs: No Transportation Needs (03/12/2024)  Utilities: Not At Risk (03/12/2024)  Depression (PHQ2-9): Low Risk  (01/28/2020)  Social Connections: Unknown (03/12/2024)  Tobacco Use: Low Risk  (03/18/2024)    Readmission Risk Interventions    12/27/2023    1:21 PM  Readmission Risk Prevention Plan  Transportation Screening Complete  Home Care Screening Complete  Medication Review (RN CM) Complete

## 2024-03-18 NOTE — Progress Notes (Signed)
 Physical Therapy Treatment Patient Details Name: [name] White MRN: [id number] DOB: 07/02/1941 Today's Date: 03/18/2024   History of Present Illness [name] White is a 83 y.o. Caucasian female with medical history significant for anxiety, asthma, depression, type 2 diabetes mellitus, hypertension, dyslipidemia, hypothyroidism and migraine, who presented to the emergency room with acute onset of generalized weakness which has been worsening over the last couple of days with ultimate status and confusion.  Though she was recently treated for UTI.  Though she denies any paresthesias or focal muscle weakness.  No chest pain or palpitations.  She has been having urinary frequency and urgency with dysuria with no hematuria or flank pain.  No fever or chills.  No cough or wheezing or hemoptysis.    PT Comments  Pt demonstrates decreased functional mobility and ambulation tolerance. Pts daughter is in the room and states she has seen an apparent decline since Tuesday of last week and pts performance backs this observation. Pt is not as verbal as last week and only states words occasionally. Pt demonstrates decreased bilateral dorsiflexion and when attempting sit to stand transfer pt goes performs extensor pattern with BLE with hip and knee extension and plantarflexion. Pt is max assist for STS and bed to chair transfer with gait belt. Pt would continue to benefit from skilled acute PT for addressing above stated deficits for increased independence with functional mobility and ambulation for increased quality of life, progress towards therapy goals, and return to higher level of function.   If plan is discharge home, recommend the following: A lot of help with walking and/or transfers;A lot of help with bathing/dressing/bathroom;Assist for transportation;Help with stairs or ramp for entrance;Supervision due to cognitive status   Can travel by private vehicle     No  Equipment Recommendations  Rolling walker  (2 wheels)    Recommendations for Other Services       Precautions / Restrictions Precautions Precautions: Fall Recall of Precautions/Restrictions: Impaired Restrictions Weight Bearing Restrictions Per Provider Order: No     Mobility  Bed Mobility Overal bed mobility: Needs Assistance Bed Mobility: Supine to Sit     Supine to sit: Mod assist, Max assist     General bed mobility comments: does show initiation of movement but is not able to move legs to EOB independently.    Transfers Overall transfer level: Needs assistance Equipment used: 2 person hand held assist, Rolling walker (2 wheels) (pt unable to use RW due to cognition and pain in wrists) Transfers: Sit to/from Stand Sit to Stand: Max assist (extensor tone notice, blocking of knees required)   Step pivot transfers: Mod assist, Max assist       General transfer comment: repeated VC, slow labored movement    Ambulation/Gait Ambulation/Gait assistance: Max assist Gait Distance (Feet): 4 Feet Assistive device: 1 person hand held assist Gait Pattern/deviations: Trunk flexed, Narrow base of support, Decreased step length - right, Decreased step length - left, Decreased stride length Gait velocity: decreased     General Gait Details: unsteady labored movement unable to stand independently with RW, during step pivot transfer pt was able to perform 4 steps to get to the chair with mod-max assist for bearing of weight   Stairs             Wheelchair Mobility     Tilt Bed    Modified Rankin (Stroke Patients Only)       Balance Overall balance assessment: Needs assistance Sitting-balance support: Feet supported, No upper  extremity supported Sitting balance-Leahy Scale: Fair Sitting balance - Comments: fair/good seated at EOB Postural control: Posterior lean Standing balance support: Reliant on assistive device for balance, During functional activity, Bilateral upper extremity supported Standing  balance-Leahy Scale: Poor Standing balance comment: fair/poor unable to successfully stand with RW today                            Communication Communication Communication: Impaired  Cognition Arousal: Alert Behavior During Therapy: Flat affect   PT - Cognitive impairments: History of cognitive impairments   Orientation impairments: Person, Place                     Following commands: Impaired Following commands impaired: Follows one step commands with increased time, Follows one step commands inconsistently    Cueing Cueing Techniques: Verbal cues, Tactile cues  Exercises      General Comments        Pertinent Vitals/Pain Pain Assessment Pain Assessment: Faces Faces Pain Scale: Hurts little more Pain Location: bilateral wrists Pain Intervention(s): Limited activity within patient's tolerance, Repositioned    Home Living                          Prior Function            PT Goals (current goals can now be found in the care plan section) Acute Rehab PT Goals Patient Stated Goal: to go to snf PT Goal Formulation: With patient/family Time For Goal Achievement: 03/26/24 Potential to Achieve Goals: Good Progress towards PT goals: Not progressing toward goals - comment (pts daughter states that a decline has been apparent since last tuesday and is hopeful for a timely response to get approval for snf.)    Frequency    Min 3X/week      PT Plan      Co-evaluation              AM-PAC PT "6 Clicks" Mobility   Outcome Measure  Help needed turning from your back to your side while in a flat bed without using bedrails?: A Little Help needed moving from lying on your back to sitting on the side of a flat bed without using bedrails?: A Little Help needed moving to and from a bed to a chair (including a wheelchair)?: A Lot Help needed standing up from a chair using your arms (e.g., wheelchair or bedside chair)?: A Lot Help  needed to walk in hospital room?: A Lot Help needed climbing 3-5 steps with a railing? : A Lot 6 Click Score: 14    End of Session Equipment Utilized During Treatment: Gait belt Activity Tolerance: Patient limited by fatigue;Patient limited by pain Patient left: in chair;with call bell/phone within reach;with chair alarm set;with family/visitor present Nurse Communication: Mobility status PT Visit Diagnosis: Unsteadiness on feet (R26.81);Other abnormalities of gait and mobility (R26.89);Muscle weakness (generalized) (M62.81);History of falling (Z91.81)     Time: 0347-4259 PT Time Calculation (min) (ACUTE ONLY): 25 min  Charges:    $Therapeutic Activity: 23-37 mins PT General Charges $$ ACUTE PT VISIT: 1 Visit                     Luz Lex, PT, DPT [facility] Office: [phone] 2:42 PM, 03/18/24

## 2024-03-18 NOTE — Plan of Care (Signed)
  Problem: Safety: Goal: Ability to remain free from injury will improve Outcome: Progressing   Problem: Safety: Goal: Ability to remain free from injury will improve Outcome: Progressing

## 2024-03-18 NOTE — Plan of Care (Signed)
  Problem: Ischemic Stroke/TIA Tissue Perfusion: Goal: Complications of ischemic stroke/TIA will be minimized Outcome: Progressing   Problem: Health Behavior/Discharge Planning: Goal: Ability to manage health-related needs will improve Outcome: Progressing Goal: Goals will be collaboratively established with patient/family Outcome: Progressing   Problem: Health Behavior/Discharge Planning: Goal: Ability to manage health-related needs will improve Outcome: Progressing   Problem: Metabolic: Goal: Ability to maintain appropriate glucose levels will improve Outcome: Progressing   Problem: Tissue Perfusion: Goal: Adequacy of tissue perfusion will improve Outcome: Progressing   Problem: Clinical Measurements: Goal: Ability to maintain clinical measurements within normal limits will improve Outcome: Progressing Goal: Will remain free from infection Outcome: Progressing Goal: Diagnostic test results will improve Outcome: Progressing   Problem: Safety: Goal: Ability to remain free from injury will improve Outcome: Progressing   Problem: Skin Integrity: Goal: Risk for impaired skin integrity will decrease Outcome: Progressing

## 2024-03-19 DIAGNOSIS — E278 Other specified disorders of adrenal gland: Secondary | ICD-10-CM | POA: Diagnosis not present

## 2024-03-19 DIAGNOSIS — Z91041 Radiographic dye allergy status: Secondary | ICD-10-CM | POA: Diagnosis not present

## 2024-03-19 DIAGNOSIS — F802 Mixed receptive-expressive language disorder: Secondary | ICD-10-CM | POA: Diagnosis not present

## 2024-03-19 DIAGNOSIS — Z7189 Other specified counseling: Secondary | ICD-10-CM | POA: Diagnosis not present

## 2024-03-19 DIAGNOSIS — R0789 Other chest pain: Secondary | ICD-10-CM | POA: Diagnosis not present

## 2024-03-19 DIAGNOSIS — G8929 Other chronic pain: Secondary | ICD-10-CM | POA: Diagnosis not present

## 2024-03-19 DIAGNOSIS — K8689 Other specified diseases of pancreas: Secondary | ICD-10-CM | POA: Diagnosis not present

## 2024-03-19 DIAGNOSIS — Z880 Allergy status to penicillin: Secondary | ICD-10-CM | POA: Diagnosis not present

## 2024-03-19 DIAGNOSIS — R6889 Other general symptoms and signs: Secondary | ICD-10-CM | POA: Diagnosis not present

## 2024-03-19 DIAGNOSIS — G9341 Metabolic encephalopathy: Secondary | ICD-10-CM | POA: Diagnosis not present

## 2024-03-19 DIAGNOSIS — R935 Abnormal findings on diagnostic imaging of other abdominal regions, including retroperitoneum: Secondary | ICD-10-CM | POA: Diagnosis not present

## 2024-03-19 DIAGNOSIS — I639 Cerebral infarction, unspecified: Secondary | ICD-10-CM | POA: Diagnosis not present

## 2024-03-19 DIAGNOSIS — J9811 Atelectasis: Secondary | ICD-10-CM | POA: Diagnosis not present

## 2024-03-19 DIAGNOSIS — E441 Mild protein-calorie malnutrition: Secondary | ICD-10-CM | POA: Diagnosis not present

## 2024-03-19 DIAGNOSIS — R5381 Other malaise: Secondary | ICD-10-CM | POA: Diagnosis not present

## 2024-03-19 DIAGNOSIS — Z20822 Contact with and (suspected) exposure to covid-19: Secondary | ICD-10-CM | POA: Diagnosis not present

## 2024-03-19 DIAGNOSIS — Z515 Encounter for palliative care: Secondary | ICD-10-CM | POA: Diagnosis not present

## 2024-03-19 DIAGNOSIS — K219 Gastro-esophageal reflux disease without esophagitis: Secondary | ICD-10-CM | POA: Diagnosis not present

## 2024-03-19 DIAGNOSIS — M6282 Rhabdomyolysis: Secondary | ICD-10-CM | POA: Diagnosis not present

## 2024-03-19 DIAGNOSIS — Z7401 Bed confinement status: Secondary | ICD-10-CM | POA: Diagnosis not present

## 2024-03-19 DIAGNOSIS — R079 Chest pain, unspecified: Secondary | ICD-10-CM | POA: Diagnosis not present

## 2024-03-19 DIAGNOSIS — Z8673 Personal history of transient ischemic attack (TIA), and cerebral infarction without residual deficits: Secondary | ICD-10-CM | POA: Diagnosis not present

## 2024-03-19 DIAGNOSIS — R1312 Dysphagia, oropharyngeal phase: Secondary | ICD-10-CM | POA: Diagnosis not present

## 2024-03-19 DIAGNOSIS — J45909 Unspecified asthma, uncomplicated: Secondary | ICD-10-CM | POA: Diagnosis not present

## 2024-03-19 DIAGNOSIS — K439 Ventral hernia without obstruction or gangrene: Secondary | ICD-10-CM | POA: Diagnosis not present

## 2024-03-19 DIAGNOSIS — M6281 Muscle weakness (generalized): Secondary | ICD-10-CM | POA: Diagnosis not present

## 2024-03-19 DIAGNOSIS — Z743 Need for continuous supervision: Secondary | ICD-10-CM | POA: Diagnosis not present

## 2024-03-19 DIAGNOSIS — R262 Difficulty in walking, not elsewhere classified: Secondary | ICD-10-CM | POA: Diagnosis not present

## 2024-03-19 DIAGNOSIS — N390 Urinary tract infection, site not specified: Secondary | ICD-10-CM | POA: Diagnosis not present

## 2024-03-19 DIAGNOSIS — R10816 Epigastric abdominal tenderness: Secondary | ICD-10-CM | POA: Diagnosis not present

## 2024-03-19 DIAGNOSIS — I10 Essential (primary) hypertension: Secondary | ICD-10-CM | POA: Diagnosis not present

## 2024-03-19 DIAGNOSIS — G43909 Migraine, unspecified, not intractable, without status migrainosus: Secondary | ICD-10-CM | POA: Diagnosis not present

## 2024-03-19 DIAGNOSIS — R0602 Shortness of breath: Secondary | ICD-10-CM | POA: Diagnosis not present

## 2024-03-19 DIAGNOSIS — I679 Cerebrovascular disease, unspecified: Secondary | ICD-10-CM | POA: Diagnosis not present

## 2024-03-19 DIAGNOSIS — R410 Disorientation, unspecified: Secondary | ICD-10-CM | POA: Diagnosis not present

## 2024-03-19 DIAGNOSIS — G9340 Encephalopathy, unspecified: Secondary | ICD-10-CM | POA: Diagnosis not present

## 2024-03-19 DIAGNOSIS — E1140 Type 2 diabetes mellitus with diabetic neuropathy, unspecified: Secondary | ICD-10-CM | POA: Diagnosis not present

## 2024-03-19 DIAGNOSIS — J99 Respiratory disorders in diseases classified elsewhere: Secondary | ICD-10-CM | POA: Diagnosis not present

## 2024-03-19 DIAGNOSIS — D519 Vitamin B12 deficiency anemia, unspecified: Secondary | ICD-10-CM | POA: Diagnosis not present

## 2024-03-19 DIAGNOSIS — E039 Hypothyroidism, unspecified: Secondary | ICD-10-CM | POA: Diagnosis not present

## 2024-03-19 DIAGNOSIS — E119 Type 2 diabetes mellitus without complications: Secondary | ICD-10-CM | POA: Diagnosis not present

## 2024-03-19 LAB — RENAL FUNCTION PANEL
Albumin: 2.8 g/dL — ABNORMAL LOW (ref 3.5–5.0)
Anion gap: 8 (ref 5–15)
BUN: 10 mg/dL (ref 8–23)
CO2: 26 mmol/L (ref 22–32)
Calcium: 8.4 mg/dL — ABNORMAL LOW (ref 8.9–10.3)
Chloride: 98 mmol/L (ref 98–111)
Creatinine, Ser: 0.51 mg/dL (ref 0.44–1.00)
GFR, Estimated: >60 mL/min (ref >60)
Glucose, Bld: 101 mg/dL — ABNORMAL HIGH (ref 70–99)
Phosphorus: 2.8 mg/dL (ref 2.5–4.6)
Potassium: 3.4 mmol/L — ABNORMAL LOW (ref 3.5–5.1)
Sodium: 132 mmol/L — ABNORMAL LOW (ref 135–145)

## 2024-03-19 LAB — GLUCOSE, CAPILLARY
Glucose-Capillary: 101 mg/dL — ABNORMAL HIGH (ref 70–99)
Glucose-Capillary: 115 mg/dL — ABNORMAL HIGH (ref 70–99)
Glucose-Capillary: 97 mg/dL (ref 70–99)

## 2024-03-19 MED ORDER — ALPRAZOLAM 0.25 MG PO TABS: 0.2500 mg | ORAL_TABLET | Freq: Every day | ORAL | 0 refills | Status: DC | PRN

## 2024-03-19 MED ORDER — SENNOSIDES-DOCUSATE SODIUM 8.6-50 MG PO TABS
2.0000 | ORAL_TABLET | Freq: Every day | ORAL | 1 refills | Status: DC
Start: 2024-03-19 — End: 2024-05-10

## 2024-03-19 MED ORDER — ALBUTEROL SULFATE HFA 108 (90 BASE) MCG/ACT IN AERS: 2.0000 | INHALATION_SPRAY | RESPIRATORY_TRACT | 0 refills | Status: DC | PRN

## 2024-03-19 MED ORDER — CLOPIDOGREL BISULFATE 75 MG PO TABS: 75.0000 mg | ORAL_TABLET | Freq: Every day | ORAL | 11 refills | Status: DC

## 2024-03-19 MED ORDER — ENSURE ENLIVE PO LIQD: 237.0000 mL | Freq: Two times a day (BID) | ORAL | 12 refills | Status: DC

## 2024-03-19 MED ORDER — ATORVASTATIN CALCIUM 40 MG PO TABS: 40.0000 mg | ORAL_TABLET | Freq: Every day | ORAL | 2 refills | Status: DC

## 2024-03-19 MED ORDER — FLUTICASONE FUROATE-VILANTEROL 200-25 MCG/ACT IN AEPB: 1.0000 | INHALATION_SPRAY | Freq: Every day | RESPIRATORY_TRACT | 1 refills | Status: DC

## 2024-03-19 MED ORDER — ASPIRIN 81 MG PO TBEC: 81.0000 mg | DELAYED_RELEASE_TABLET | Freq: Every day | ORAL | 0 refills | Status: AC

## 2024-03-19 MED ORDER — PREGABALIN 150 MG PO CAPS: 150.0000 mg | ORAL_CAPSULE | Freq: Two times a day (BID) | ORAL | 0 refills | Status: DC

## 2024-03-19 MED ORDER — TRAZODONE HCL 50 MG PO TABS: 50.0000 mg | ORAL_TABLET | Freq: Every evening | ORAL | 0 refills | Status: DC | PRN

## 2024-03-19 MED ORDER — POTASSIUM CHLORIDE 20 MEQ PO PACK
40.0000 meq | PACK | ORAL | Status: AC
Start: 2024-03-19 — End: 2024-03-19
  Administered 2024-03-19 (×2): 40 meq via ORAL
  Filled 2024-03-19 (×2): qty 2

## 2024-03-19 NOTE — Discharge Instructions (Addendum)
 1)Take Aspirin 81 mg daily along with Plavix 75 mg daily for 21 days then after that STOP the Aspirin  and continue ONLY Plavix 75 mg daily indefinitely--for secondary stroke Prevention (Per The multicenter SAMMPRIS trial)  2)Avoid ibuprofen/Advil/Aleve/Motrin/Goody Powders/Naproxen/BC powders/Meloxicam/Diclofenac/Indomethacin and other Nonsteroidal anti-inflammatory medications as these will make you more likely to bleed and can cause stomach ulcers, can also cause Kidney problems.   3)Repeat CBC and BMP Tests in 1 week  4)Dysphagia 2 diet----chopped solids, with regular thin liquids advised  5)Repeat TSH and serum B12 levels in 8 to 12 weeks

## 2024-03-19 NOTE — Progress Notes (Signed)
 Report called to [place] at [facility]

## 2024-03-19 NOTE — Plan of Care (Signed)
   Problem: Health Behavior/Discharge Planning: Goal: Ability to manage health-related needs will improve Outcome: Progressing

## 2024-03-19 NOTE — TOC Transition Note (Signed)
 Transition of Care [facility]) - Discharge Note   Patient Details  Name: [name] White MRN: [id number] Date of Birth: [birth year]  [facility] CM/SW Contact:  Villa Herb, LCSWA Phone Number: 03/19/2024, 12:05 PM   Clinical Narrative:    CSW called to speak with daughter to review additional bed offers, pts daughter would like to move forward and accept bed at [facility]. CSW updated Shana in admissions. Insurance auth approved for SNF at [facility]. RN provided room and report numbers to RN for review. Med necessity printed to floor for RN. EMS called for transport. TOC signing off.   Final next level of care: Skilled Nursing Facility Barriers to Discharge: Barriers Resolved   Patient Goals and CMS Choice Patient states their goals for this hospitalization and ongoing recovery are:: go to SNF CMS Medicare.gov Compare Post Acute Care list provided to:: Patient Represenative (must comment) Choice offered to / list presented to : Adult Children [place] ownership interest in [facility].provided to:: Adult Children    Discharge Placement                Patient to be transferred to facility by: EMS Name of family member notified: Daughter Patient and family notified of of transfer: 03/19/24  Discharge Plan and Services Additional resources added to the After Visit Summary for   In-house Referral: Clinical Social Work Discharge Planning Services: CM Consult Post Acute Care Choice: Skilled Nursing Facility                               Social Drivers of Health (SDOH) Interventions SDOH Screenings   Food Insecurity: No Food Insecurity (03/12/2024)  Housing: Low Risk  (03/12/2024)  Transportation Needs: No Transportation Needs (03/12/2024)  Utilities: Not At Risk (03/12/2024)  Depression (PHQ2-9): Low Risk  (01/28/2020)  Social Connections: Unknown (03/12/2024)  Tobacco Use: Low Risk  (03/18/2024)     Readmission Risk Interventions    12/27/2023    1:21  PM  Readmission Risk Prevention Plan  Transportation Screening Complete  Home Care Screening Complete  Medication Review (RN CM) Complete

## 2024-03-19 NOTE — Plan of Care (Signed)
  Problem: Ischemic Stroke/TIA Tissue Perfusion: Goal: Complications of ischemic stroke/TIA will be minimized Outcome: Progressing   Problem: Health Behavior/Discharge Planning: Goal: Ability to manage health-related needs will improve Outcome: Progressing Goal: Goals will be collaboratively established with patient/family Outcome: Progressing   Problem: Health Behavior/Discharge Planning: Goal: Ability to manage health-related needs will improve Outcome: Progressing   Problem: Metabolic: Goal: Ability to maintain appropriate glucose levels will improve Outcome: Progressing   Problem: Tissue Perfusion: Goal: Adequacy of tissue perfusion will improve Outcome: Progressing   Problem: Clinical Measurements: Goal: Ability to maintain clinical measurements within normal limits will improve Outcome: Progressing Goal: Will remain free from infection Outcome: Progressing Goal: Diagnostic test results will improve Outcome: Progressing   Problem: Safety: Goal: Ability to remain free from injury will improve Outcome: Progressing   Problem: Skin Integrity: Goal: Risk for impaired skin integrity will decrease Outcome: Progressing

## 2024-03-19 NOTE — Discharge Summary (Signed)
 [name] White, is a 83 y.o. female  DOB [birth year]  MRN [id number].  Admission date:  03/11/2024  Admitting Physician  [name] Beat, MD  Discharge Date:  03/19/2024   Primary MD  [name] Stabile, MD  Recommendations for primary care physician for things to follow:   1)Take Aspirin 81 mg daily along with Plavix 75 mg daily for 21 days then after that STOP the Aspirin  and continue ONLY Plavix 75 mg daily indefinitely--for secondary stroke Prevention (Per The multicenter SAMMPRIS trial)  2)Avoid ibuprofen/Advil/Aleve/Motrin/Goody Powders/Naproxen/BC powders/Meloxicam/Diclofenac/Indomethacin and other Nonsteroidal anti-inflammatory medications as these will make you more likely to bleed and can cause stomach ulcers, can also cause Kidney problems.   3)Repeat CBC and BMP Tests in 1 week  4)Dysphagia 2 diet----chopped solids, with regular thin liquids advised  5)Repeat TSH and serum B12 levels in 8 to 12 weeks  Admission Diagnosis  Acute cystitis without hematuria [N30.00] Acute encephalopathy [G93.40] Ischemic stroke (HCC) [I63.9] Cerebrovascular accident (CVA), unspecified mechanism (HCC) [I63.9]   Discharge Diagnosis  Acute cystitis without hematuria [N30.00] Acute encephalopathy [G93.40] Ischemic stroke (HCC) [I63.9] Cerebrovascular accident (CVA), unspecified mechanism (HCC) [I63.9]    Principal Problem:   Acute encephalopathy Active Problems:   CVA (cerebral vascular accident) (HCC)   Acute lower UTI   Hypothyroidism   Type 2 diabetes mellitus with peripheral neuropathy (HCC)   Anxiety and depression   GERD without esophagitis      Past Medical History:  Diagnosis Date   Anxiety    Asthma    ASYMPTOMATIC POSTMENOPAUSAL STATUS 02/09/2009   Cataract    Constipation 03/19/2013   Depression    DIABETES MELLITUS, TYPE II 09/14/2007   HYPERCHOLESTEROLEMIA 02/09/2009   HYPERTENSION 02/09/2009    HYPOTHYROIDISM 09/14/2007   MIGRAINE HEADACHE 09/14/2007   Neuropathy    OSTEOPOROSIS 02/09/2009   PANCREATITIS 09/14/2007   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   PONV (postoperative nausea and vomiting)    Rectocele 03/19/2013   Shortness of breath    SUPERFICIAL PHLEBITIS 09/14/2007   Varicose veins     Past Surgical History:  Procedure Laterality Date   ABDOMINAL HYSTERECTOMY     ANTERIOR AND POSTERIOR REPAIR N/A 04/02/2013   Procedure: ANTERIOR (CYSTOCELE) AND POSTERIOR REPAIR (RECTOCELE);  Surgeon: [name] Burrow, MD;  Location: [facility];  Service: Gynecology;  Laterality: N/A;   APPENDECTOMY     BRAIN SURGERY     CATARACT EXTRACTION W/PHACO  12/05/2011   Procedure: CATARACT EXTRACTION PHACO AND INTRAOCULAR LENS PLACEMENT (IOC);  Surgeon: [name] White;  Location: [facility];  Service: Ophthalmology;  Laterality: Left;  CDE:9.65   CHOLECYSTECTOMY     CRANIOTOMY N/A 06/04/2020   Procedure: Endoscopic Transphenoidal Resection of Recurrent PituitaryTumor;  Surgeon: [name] Abu, MD;  Location: [facility];  Service: Neurosurgery;  Laterality: N/A;   CYSTOSCOPY     ENDOVENOUS ABLATION SAPHENOUS VEIN W/ LASER  11-03-2011   right greater saphenous vein   left leg done 10-2011   EYE SURGERY  98   right cataract extraction 98  LAPAROSCOPIC NISSEN FUNDOPLICATION     NM ESOPHAGEAL REFLUX  08-11-11   PITUITARY EXCISION  10/1997   POSTERIOR REPAIR     TRANSNASAL APPROACH N/A 06/04/2020   Procedure: TRANSNASAL APPROACH;  Surgeon: [name] Coho, MD;  Location: [facility] OR;  Service: ENT;  Laterality: N/A;   TRANSPHENOIDAL / TRANSNASAL HYPOPHYSECTOMY / RESECTION PITUITARY TUMOR  08-11-11     HPI  from the history and physical done on the day of admission:   DONYEA White is a 83 y.o. Caucasian female with medical history significant for anxiety, asthma, depression, type 2 diabetes mellitus, hypertension, dyslipidemia, hypothyroidism and migraine, who presented to the emergency room with acute onset of generalized  weakness which has been worsening over the last couple of days with ultimate status and confusion.  Though she was recently treated for UTI.  Though she denies any paresthesias or focal muscle weakness.  No chest pain or palpitations.  She has been having urinary frequency and urgency with dysuria with no hematuria or flank pain.  No fever or chills.  No cough or wheezing or hemoptysis.   ED Course: When the patient came to the ER, vital signs were within normal.  Labs revealed unremarkable CMP and magnesium levels 1.4.  Lactic acid was 1.1.  CBC was within normal.  UA was positive for UTI.  Urine culture was sent. EKG as reviewed by me :  EKG showed normal sinus rhythm with a rate of 77 with short PR interval. Imaging: Two-view chest x-ray showed no acute cardiopulmonary disease. Noncontrast head CT scan and C-spine CT revealed the following: 1. No acute intracranial abnormality. 2. No acute fracture or static subluxation of the cervical spine. 3. Unchanged appearance of suprasellar mass.   Brain MRI without contrast revealed the following: 1. Two distinct 5 mm foci of diffusion signal abnormality involving the posterior left parietal and left periatrial white matter, consistent with small evolving ischemic infarcts, early subacute in appearance. No associated hemorrhage or mass effect. 2. No other acute intracranial abnormality. 3. Age-related cerebral atrophy with moderate chronic microvascular ischemic disease, with a few scattered remote infarcts involving the hemispheric cerebral white matter and cerebellum. 4. 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   MRA of the head and neck was normal.   The patient was given a gram of p.o. Tylenol, 150 mg p.o. Lyrica and 750 mg of IV Levaquin.  She admitted to a medical telemetry bed for further evaluation and management.   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 -Neurology consult from Dr. Melynda Ripple as well as neurology consult from Dr. Peri Jefferson appreciated, and subsequent input from neurologist Dr. Petra Kuba appreciated -MRA head and neck without LVO or significant stenosis -Aspirin 81 mg daily along with Plavix 75 mg daily for 21 days then after that STOP the  Aspirin and continue ONLY Plavix 75 mg daily indefinitely--for secondary stroke Prevention (Per The multicenter SAMMPRIS trial) --Restarted atorvastatin - -- Speech pathologist recommendations appreciated recommends dysphagia 2 diet (fine chopped) and thin liquids --2D echo with preserved ejection fraction, no wall motion abnormalities and no significant valvular disorder. -Discharge to SNF rehab---    2)Acute metabolic  encephalopathy- -suspect related to #1 above -B12 deficiency noted--replacement given -Urine culture without significant growth (treated with Rocephin/Duricef, no further antibiotics needed -Thyroid panel within normal limits;   -Thiamine replaced -More interactive -Avoid sedatives -Baclofen changed back to 10 mg nightly -- Mentation improved significantly, back to baseline   3)GERD without esophagitis -Continue Protonix     4)Anxiety and depression -No suicidal ideation hallucination -More interactive -Continue as needed home dose Xanax -Continue home dose Effexor and Trintellix     5)Type 2 diabetes mellitus with peripheral neuropathy (HCC) -recent A1c 6.0--reflecting excellent diabetic control PTA -Hold PTA metformin  - 6)Hypothyroidism - Continue Synthroid -TSH within normal limits. -Follow-up thyroid panel in  46-month as an outpatient.   7)B12 deficiency -B12 level 144; aggressive repletion and maintenance therapy initiated. -Continue to follow B12 trend with repeat levels in 12 weeks. -Please see #2 above   8)HTN--stable, continue Coreg   9)FEN--- oral intake improving   -- recent CVA with cognitive challenges -- Speech pathologist eval appreciated see #1 above - allow patient to eat as much as she desires  Discharge Condition: stable  Follow UP   Contact information for follow-up providers     [facility].   Specialty: Home Health Services Why: PT will call to schedule your next home visit Contact information: [address] [place] [address] [phone]              Contact information for after-discharge care     Destination     [facility] .   Service: Skilled Nursing Contact information: [address] [place] [address] [phone]                     Consults obtained - Neurology  Diet and Activity recommendation:  As advised  Discharge Instructions    Discharge Instructions     Ambulatory referral to Neurology   Complete by: As directed    An appointment is requested in approximately: 8-12 weeks   Call MD for:  difficulty breathing, headache or visual disturbances   Complete by: As directed    Call MD for:  persistant dizziness or light-headedness   Complete by: As directed    Call MD for:  persistant nausea and vomiting   Complete by: As directed    Call MD for:  temperature >100.4   Complete by: As directed    Diet general   Complete by: As directed    Dysphagia 2 diet--- with chopped solids and regular thin liquids advised   Discharge instructions   Complete by: As directed    1)Take Aspirin 81 mg daily along with Plavix 75 mg daily for 21 days then after that STOP the Aspirin  and continue ONLY Plavix 75 mg daily indefinitely--for secondary stroke  Prevention (Per The multicenter SAMMPRIS trial)  2)Avoid ibuprofen/Advil/Aleve/Motrin/Goody Powders/Naproxen/BC powders/Meloxicam/Diclofenac/Indomethacin and other Nonsteroidal anti-inflammatory medications as these will make you more likely to bleed and can cause stomach ulcers, can also cause Kidney problems.   3)Repeat CBC and BMP Tests in 1 week  4)Dysphagia 2 diet----chopped solids, with regular thin liquids advised  5)Repeat TSH and serum B12 levels in 8 to 12 weeks   Increase activity slowly   Complete by: As directed          Discharge Medications     Allergies as of 03/19/2024       Reactions   Betadine [povidone Iodine] Hives  Penicillins Rash        Medication List     STOP taking these medications    CORICIDIN D PO   CRANBERRY-D MANNOSE PO   Farxiga 10 MG Tabs tablet Generic drug: dapagliflozin propanediol   furosemide 20 MG tablet Commonly known as: LASIX   metFORMIN 500 MG tablet Commonly known as: GLUCOPHAGE   naproxen sodium 220 MG tablet Commonly known as: ALEVE   senna 8.6 MG Tabs tablet Commonly known as: SENOKOT       TAKE these medications    acetaminophen 325 MG tablet Commonly known as: TYLENOL Take 2 tablets (650 mg total) by mouth every 6 (six) hours as needed for mild pain (pain score 1-3) (or Fever >/= 101).   albuterol 108 (90 Base) MCG/ACT inhaler Commonly known as: VENTOLIN HFA Inhale 2 puffs into the lungs every 4 (four) hours as needed for wheezing or shortness of breath.   ALPRAZolam 0.25 MG tablet Commonly known as: XANAX Take 1 tablet (0.25 mg total) by mouth daily as needed for anxiety. What changed: reasons to take this   aspirin EC 81 MG tablet Take 1 tablet (81 mg total) by mouth daily with breakfast. take Aspirin 81 mg daily along with Plavix 75 mg daily for 21 days then after that STOP the Aspirin  and continue ONLY Plavix 75 mg daily indefinitely--for secondary stroke Prevention (Per The multicenter  SAMMPRIS trial)   atorvastatin 40 MG tablet Commonly known as: LIPITOR Take 1 tablet (40 mg total) by mouth daily. Start taking on: March 20, 2024   baclofen 10 MG tablet Commonly known as: LIORESAL Take 10 mg by mouth at bedtime.   carvedilol 3.125 MG tablet Commonly known as: COREG TAKE ONE TABLET BY MOUTH TWICE A DAY   clopidogrel 75 MG tablet Commonly known as: PLAVIX Take 1 tablet (75 mg total) by mouth daily. take Aspirin 81 mg daily along with Plavix 75 mg daily for 21 days then after that STOP the Aspirin  and continue ONLY Plavix 75 mg daily indefinitely--for secondary stroke Prevention (Per The multicenter SAMMPRIS trial) Start taking on: March 20, 2024   cycloSPORINE 0.05 % ophthalmic emulsion Commonly known as: RESTASIS Place 1 drop into both eyes 2 (two) times daily.   estradiol 0.1 MG/GM vaginal cream Commonly known as: ESTRACE Place 1 Applicatorful vaginally 2 (two) times a week.   feeding supplement Liqd Take 237 mLs by mouth 2 (two) times daily between meals.   fluticasone furoate-vilanterol 200-25 MCG/ACT Aepb Commonly known as: Breo Ellipta Inhale 1 puff into the lungs daily.   Gemtesa 75 MG Tabs Generic drug: Vibegron Take 75 mg by mouth at bedtime.   levothyroxine 50 MCG tablet Commonly known as: SYNTHROID Take 50 mcg by mouth daily.   multivitamins ther. w/minerals Tabs tablet Take 1 tablet by mouth every morning.   omeprazole 20 MG capsule Commonly known as: PRILOSEC Take 20 mg by mouth every morning.   pregabalin 150 MG capsule Commonly known as: LYRICA TAKE ONE CAPSULE (150 MG TOTAL) BY MOUTHTWO TIMES DAILY.   rizatriptan 10 MG disintegrating tablet Commonly known as: MAXALT-MLT Take 10 mg by mouth as needed for migraine. May repeat in 2 hours if needed   senna-docusate 8.6-50 MG tablet Commonly known as: Senokot-S Take 2 tablets by mouth at bedtime.   traZODone 50 MG tablet Commonly known as: DESYREL Take 1 tablet (50 mg total)  by mouth at bedtime as needed for sleep.   Trintellix 10 MG Tabs tablet  Generic drug: vortioxetine HBr Take 10 mg by mouth daily.   venlafaxine XR 150 MG 24 hr capsule Commonly known as: EFFEXOR-XR Take 150 mg by mouth every morning.        Major procedures and Radiology Reports - PLEASE review detailed and final reports for all details, in brief -   MR BRAIN WO CONTRAST Result Date: 03/16/2024 CLINICAL DATA:  Initial evaluation for acute neuro deficit, stroke suspected. EXAM: MRI HEAD WITHOUT CONTRAST TECHNIQUE: Multiplanar, multiecho pulse sequences of the brain and surrounding structures were obtained without intravenous contrast. COMPARISON:  CT from 03/14/2024 as well as recent MRI from 03/11/2024 FINDINGS: Brain: Examination technically limited as the patient was unable to tolerate the exam. Axial coronal DWI sequences only were obtained. There has been normal expected interval evolution of previously identified small ischemic infarcts involving the left parietal lobe and left periatrial white matter, now demonstrating only mild residual diffusion signal abnormality. No significant mass effect. No other evidence for acute or interval infarction. Gray-white matter differentiation otherwise grossly maintained. No visible mass lesion or mass effect. No midline shift. No hydrocephalus or extra-axial fluid collection. Underlying atrophy with chronic ischemic changes again noted, grossly stable. Vascular: Not well assessed on this limited exam. Skull and upper cervical spine: Not well assessed on this limited exam. Sinuses/Orbits: Not well assessed on this limited exam. Other: None. IMPRESSION: 1. Technically limited exam due to the patient's inability to tolerate the study. Diffusion-weighted sequences only were obtained. 2. Normal expected interval evolution of previously identified small ischemic infarcts involving the left parietal lobe and left periatrial white matter. No significant mass  effect. 3. No other new or interval infarction or other definite acute intracranial abnormality. Electronically Signed   By: Rise Mu M.D.   On: 03/16/2024 00:59   CT HEAD CODE STROKE WO CONTRAST Result Date: 03/14/2024 CLINICAL DATA:  Code stroke. Initial evaluation for acute neuro deficit, stroke suspected. EXAM: CT HEAD WITHOUT CONTRAST TECHNIQUE: Contiguous axial images were obtained from the base of the skull through the vertex without intravenous contrast. RADIATION DOSE REDUCTION: This exam was performed according to the departmental dose-optimization program which includes automated exposure control, adjustment of the mA and/or kV according to patient size and/or use of iterative reconstruction technique. COMPARISON:  Prior studies from 03/11/2024. FINDINGS: Brain: Atrophy with moderate chronic microvascular ischemic disease, stable. Few small remote bilateral cerebellar infarcts noted. No acute intracranial hemorrhage. No acute large vessel territory infarct. Patient's known sellar/suprasellar mass again noted, grossly stable. No other mass lesion, midline shift or mass effect. No hydrocephalus or extra-axial fluid collection. Vascular: No abnormal hyperdense vessel. Skull: Scalp soft tissues and calvarium within normal limits. Sinuses/Orbits: Globes and orbital soft tissues grossly within normal limits. Visualized paranasal sinuses are largely clear. No significant mastoid effusion. Other: None. ASPECTS [facility] Stroke Program Early CT Score) - Ganglionic level infarction (caudate, lentiform nuclei, internal capsule, insula, M1-M3 cortex): 7 - Supraganglionic infarction (M4-M6 cortex): 3 Total score (0-10 with 10 being normal): 10 IMPRESSION: 1. No acute intracranial abnormality. 2. Aspects is 10. 3. Atrophy with chronic small vessel ischemic disease, with a few small remote bilateral cerebellar infarcts. 4. Stable sellar/suprasellar mass. These results were communicated to Dr. Thomes Dinning at  10:05 pm on 03/14/2024 by text page via the [facility] messaging system. Electronically Signed   By: Rise Mu M.D.   On: 03/14/2024 22:05   EEG adult Result Date: 03/14/2024 Charlsie Quest, MD     03/14/2024  4:14 PM Patient Name: Bonita Quin  DOMENIQUE QUEST MRN: [id number] Epilepsy Attending: Charlsie Quest Referring Physician/Provider: Vassie Loll, MD Date: 03/14/2024 Duration: 22.07 mins Patient history: 83yo F with ams. EEG to evaluate for seizure Level of alertness: Awake AEDs during EEG study: None Technical aspects: This EEG study was done with scalp electrodes positioned according to the 10-20 International system of electrode placement. Electrical activity was reviewed with band pass filter of 1-70Hz , sensitivity of 7 uV/mm, display speed of 28mm/sec with a 60Hz  notched filter applied as appropriate. EEG data were recorded continuously and digitally stored.  Video monitoring was available and reviewed as appropriate. Description: EEG showed continuous generalized 3 to 5 Hz theta-delta slowing. Generalized periodic discharges with triphasic morphology at 1 Hz were also noted. Hyperventilation and photic stimulation were not performed.   Of note, study was technically difficult due to significant myogenic artifact. ABNORMALITY - Periodic discharges with triphasic morphology, generalized ( GPDs) - Continuous slow, generalized IMPRESSION: This technically difficult study is suggestive of moderate diffuse encephalopathy likely related to toxic-metabolic causes. No seizures or definite epileptiform discharges were seen throughout the recording. Charlsie Quest   ECHOCARDIOGRAM COMPLETE BUBBLE STUDY Result Date: 03/13/2024    ECHOCARDIOGRAM REPORT   Patient Name:   ALYNE MARTINSON Date of Exam: 03/13/2024 Medical Rec #:  [id number]      Height:       65.0 in Accession #:    [id number]     Weight:       133.0 lb Date of Birth:  January 12, 1941       BSA:          1.663 m Patient Age:    82 years       BP:            108/56 mmHg Patient Gender: F              HR:           77 bpm. [facility] Procedure: 2D Echo, Cardiac Doppler, Color Doppler, Saline Contrast Bubble Study            and Intracardiac Opacification Agent (Both Spectral and Color Flow            Doppler were utilized during procedure). Indications:    Stroke  History:        Patient has prior history of Echocardiogram examinations, most                 recent 06/25/2014. Risk Factors:Hypertension and Diabetes.  Sonographer:    Amy Chionchio Referring Phys: [id number] JAN A MANSY IMPRESSIONS  1. Left ventricular ejection fraction, by estimation, is 60 to 65%. The left ventricle has normal function. The left ventricle has no regional wall motion abnormalities. Left ventricular diastolic parameters are indeterminate.  2. Right ventricular systolic function is normal. The right ventricular size is normal. There is normal pulmonary artery systolic pressure. The estimated right ventricular systolic pressure is 35.7 mmHg.  3. Left atrial size was moderately dilated.  4. The mitral valve is degenerative. Mild to moderate mitral valve regurgitation. Moderate mitral stenosis. The mean mitral valve gradient is 7.0 mmHg. Moderate mitral annular calcification.  5. The aortic valve is tricuspid. Aortic valve regurgitation is trivial. Aortic valve sclerosis/calcification is present, without any evidence of aortic stenosis. Aortic valve mean gradient measures 2.0 mmHg.  6. The inferior vena cava is normal in size with greater than 50% respiratory variability, suggesting right atrial pressure of 3 mmHg.  7. Agitated saline contrast  bubble study was negative, with no evidence of any interatrial shunt. Comparison(s): Prior images unable to be directly viewed. FINDINGS  Left Ventricle: Left ventricular ejection fraction, by estimation, is 60 to 65%. The left ventricle has normal function. The left ventricle has no regional wall motion abnormalities. Definity contrast agent  was given IV to delineate the left ventricular  endocardial borders. The left ventricular internal cavity size was normal in size. There is no left ventricular hypertrophy. Left ventricular diastolic function could not be evaluated due to mitral stenosis. Left ventricular diastolic parameters are indeterminate. Right Ventricle: The right ventricular size is normal. No increase in right ventricular wall thickness. Right ventricular systolic function is normal. There is normal pulmonary artery systolic pressure. The tricuspid regurgitant velocity is 2.86 m/s, and  with an assumed right atrial pressure of 3 mmHg, the estimated right ventricular systolic pressure is 35.7 mmHg. Left Atrium: Left atrial size was moderately dilated. Right Atrium: Right atrial size was normal in size. Pericardium: There is no evidence of pericardial effusion. Presence of epicardial fat layer. Mitral Valve: The mitral valve is degenerative in appearance. There is mild calcification of the mitral valve leaflet(s). Moderate mitral annular calcification. Mild to moderate mitral valve regurgitation. Moderate mitral valve stenosis. MV peak gradient, 13.5 mmHg. The mean mitral valve gradient is 7.0 mmHg. Tricuspid Valve: The tricuspid valve is grossly normal. Tricuspid valve regurgitation is mild. Aortic Valve: The aortic valve is tricuspid. There is mild aortic valve annular calcification. Aortic valve regurgitation is trivial. Aortic valve sclerosis/calcification is present, without any evidence of aortic stenosis. Aortic valve mean gradient measures 2.0 mmHg. Aortic valve peak gradient measures 4.2 mmHg. Aortic valve area, by VTI measures 2.31 cm. Pulmonic Valve: The pulmonic valve was grossly normal. Pulmonic valve regurgitation is trivial. Aorta: The aortic root and ascending aorta are structurally normal, with no evidence of dilitation. Venous: The inferior vena cava is normal in size with greater than 50% respiratory variability,  suggesting right atrial pressure of 3 mmHg. IAS/Shunts: No atrial level shunt detected by color flow Doppler. Agitated saline contrast was given intravenously to evaluate for intracardiac shunting. Agitated saline contrast bubble study was negative, with no evidence of any interatrial shunt. Additional Comments: 3D was performed not requiring image post processing on an independent workstation and was indeterminate.  LEFT VENTRICLE PLAX 2D LVIDd:         4.00 cm     Diastology LVIDs:         3.00 cm     LV e' medial:    5.77 cm/s LV PW:         0.70 cm     LV E/e' medial:  30.0 LV IVS:        0.80 cm     LV e' lateral:   7.18 cm/s LVOT diam:     1.90 cm     LV E/e' lateral: 24.1 LV SV:         46 LV SV Index:   28 LVOT Area:     2.84 cm  LV Volumes (MOD) LV vol d, MOD A4C: 57.5 ml LV vol s, MOD A4C: 13.3 ml LV SV MOD A4C:     57.5 ml RIGHT VENTRICLE RV Basal diam:  3.40 cm RV S prime:     11.50 cm/s TAPSE (M-mode): 1.7 cm LEFT ATRIUM           Index LA Vol (A4C): 77.2 ml 46.42 ml/m  AORTIC VALVE  PULMONIC VALVE AV Area (Vmax):    2.34 cm     PV Vmax:       0.72 m/s AV Area (Vmean):   2.21 cm     PV Peak grad:  2.1 mmHg AV Area (VTI):     2.31 cm AV Vmax:           103.00 cm/s AV Vmean:          70.400 cm/s AV VTI:            0.201 m AV Peak Grad:      4.2 mmHg AV Mean Grad:      2.0 mmHg LVOT Vmax:         84.90 cm/s LVOT Vmean:        54.800 cm/s LVOT VTI:          0.164 m LVOT/AV VTI ratio: 0.82  AORTA Ao Asc diam: 3.00 cm MITRAL VALVE                TRICUSPID VALVE MV Area (PHT): 2.00 cm     TR Peak grad:   32.7 mmHg MV Area VTI:   0.92 cm     TR Vmax:        286.00 cm/s MV Peak grad:  13.5 mmHg MV Mean grad:  7.0 mmHg     SHUNTS MV Vmax:       1.84 m/s     Systemic VTI:  0.16 m MV Vmean:      132.0 cm/s   Systemic Diam: 1.90 cm MV Decel Time: 379 msec MV E velocity: 173.00 cm/s MV A velocity: 114.00 cm/s MV E/A ratio:  1.52 Nona Dell MD Electronically signed by Nona Dell MD  Signature Date/Time: 03/13/2024/2:23:15 PM    Final    MR ANGIO HEAD WO CONTRAST Result Date: 03/12/2024 CLINICAL DATA:  Follow-up examination for stroke. EXAM: MRA HEAD WITHOUT CONTRAST TECHNIQUE: Angiographic images of the Circle of Willis were acquired using MRA technique without intravenous contrast. COMPARISON:  Prior brain MRI from 03/11/2024 FINDINGS: Anterior circulation: Of examination moderately degraded by motion artifact. Both internal carotid arteries are patent through the siphons without convincing stenosis or other abnormality. A1 segments patent bilaterally. Normal anterior communicating artery complex. Anterior cerebral arteries patent without significant stenosis. No visible M1 stenosis. Distal MCA branches perfused and symmetric. Posterior circulation: Both vertebral arteries are patent without significant stenosis. Left vertebral artery dominant. Both PICA patent. Basilar patent without stenosis. Superior cerebellar and posterior cerebral arteries patent bilaterally. Anatomic variants: As above. Other: No visible aneurysm or other vascular malformation. IMPRESSION: 1. Motion degraded exam. 2. Negative intracranial MRA. No large vessel occlusion or hemodynamically significant stenosis. Electronically Signed   By: Rise Mu M.D.   On: 03/12/2024 20:00   MR Angiogram Neck W or Wo Contrast Result Date: 03/11/2024 CLINICAL DATA:  Initial evaluation for carotid artery aneurysm. EXAM: MRA NECK WITHOUT AND WITH CONTRAST TECHNIQUE: Multiplanar and multiecho pulse sequences of the neck were obtained without and with intravenous contrast. Angiographic images of the neck were obtained using MRA technique without and with intravenous contrast. CONTRAST:  6.34mL GADAVIST GADOBUTROL 1 MMOL/ML IV SOLN COMPARISON:  None Available. FINDINGS: AORTIC ARCH: Visualized aortic arch within normal limits for caliber with standard 3 vessel morphology. No stenosis or other abnormality about the origin of  the great vessels. RIGHT CAROTID SYSTEM: Right common and internal carotid arteries are patent with antegrade flow. No evidence for dissection. No hemodynamically significant stenosis about the right  carotid artery system within the neck. LEFT CAROTID SYSTEM: Left common and internal carotid arteries are patent with antegrade flow. No evidence for dissection. No hemodynamically significant stenosis about the left carotid artery system within the neck. VERTEBRAL ARTERIES: Both vertebral arteries arise from the subclavian arteries. No visible proximal subclavian artery stenosis. Left vertebral artery dominant. Vertebral arteries are patent with antegrade flow. No evidence for dissection. No hemodynamically significant stenosis bilateral vertebral artery within the neck. Other: None. IMPRESSION: Normal MRA of the neck. Electronically Signed   By: Rise Mu M.D.   On: 03/11/2024 23:15   MR BRAIN WO CONTRAST Result Date: 03/11/2024 CLINICAL DATA:  Initial evaluation for acute altered mental status. EXAM: MRI HEAD WITHOUT CONTRAST TECHNIQUE: Multiplanar, multiecho pulse sequences of the brain and surrounding structures were obtained without intravenous contrast. COMPARISON:  Prior CT from 12/25/2023 and MRI from 10/26/2023 FINDINGS: Brain: Generalized age-related cerebral atrophy. Patchy and confluent T2/FLAIR hyperintensity involving the supratentorial cerebral white matter, most characteristic of chronic microvascular ischemic disease, moderately advanced in nature. Few scattered superimposed remote lacunar infarcts present about the hemispheric cerebral white matter. Chronic infarct involving the posterior left insular region noted. Few small remote bilateral cerebellar infarcts noted. 5 mm focus of diffusion signal abnormality seen involving the posterior left parietal white matter (series 5, image 30). Additional subtle 5 mm focus of diffusion signal abnormality seen involving the left periatrial white  matter (series 5, image 24). Findings consistent with small evolving early subacute ischemic infarcts. No associated hemorrhage or mass effect. No other evidence for acute or subacute ischemia. No acute intracranial hemorrhage. No significant chronic intracranial blood products. Patient's known sellar/suprasellar mass noted, grossly similar as compared to previous MRI. No other mass lesion, mass effect, or midline shift. No hydrocephalus or extra-axial fluid collection. Vascular: Major intracranial vascular flow voids are maintained. Skull and upper cervical spine: Craniocervical junction within normal limits. Bone marrow signal intensity normal. No scalp soft tissue abnormality. Sinuses/Orbits: Prior bilateral ocular lens replacement. Paranasal sinuses are largely clear. Trace bilateral mastoid effusions, of doubtful significance. Negative nasopharynx. Other: None. IMPRESSION: 1. Two distinct 5 mm foci of diffusion signal abnormality involving the posterior left parietal and left periatrial white matter, consistent with small evolving ischemic infarcts, early subacute in appearance. No associated hemorrhage or mass effect. 2. No other acute intracranial abnormality. 3. Age-related cerebral atrophy with moderate chronic microvascular ischemic disease, with a few scattered remote infarcts involving the hemispheric cerebral white matter and cerebellum. 4. 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 Electronically Signed   By: Rise Mu M.D.   On: 03/11/2024 23:11   DG Chest 2 View Result Date: 03/11/2024 CLINICAL DATA:  Altered mental status, suspected sepsis EXAM: CHEST - 2 VIEW COMPARISON:  Chest radiograph dated 12/25/2023 FINDINGS: Normal lung volumes. No focal consolidations. No pleural effusion or pneumothorax. The heart size and mediastinal contours are within normal limits. No acute osseous abnormality. Right upper quadrant surgical clips. IMPRESSION: No active  cardiopulmonary disease. Electronically Signed   By: Agustin Cree M.D.   On: 03/11/2024 16:21    Micro Results   Recent Results (from the past 240 hours)  Urine Culture     Status: Abnormal   Collection Time: 03/11/24  2:31 PM   Specimen: Urine, Random  Result Value Ref Range Status   Specimen Description   Final    URINE, RANDOM Performed at [facility], [address], [place], [place] [address]    Special Requests   Final  NONE Reflexed from [id number] Performed at [facility], [address], [place], [place] [address]    Culture (A)  Final    <10,000 COLONIES/mL INSIGNIFICANT GROWTH Performed at [facility], [address], [place], [place] [address]    Report Status 03/12/2024 FINAL  Final    Today   Subjective    Glorene Leitzke today has no new complaints  - Eating and drinking better -More cooperative, more verbal and more interactive          Patient has been seen and examined prior to discharge   Objective   Blood pressure 118/70, pulse 77, temperature 98.3 F (36.8 C), temperature source Oral, resp. rate 14, height 5\' 5"  (1.651 m), weight 60.3 kg, SpO2 96%.   Intake/Output Summary (Last 24 hours) at 03/19/2024 1132 Last data filed at 03/19/2024 0618 Gross per 24 hour  Intake 240 ml  Output 300 ml  Net -60 ml   Exam Gen:- Awake Alert, no acute distress , cooperative HEENT:- [place].AT, No sclera icterus Neck-Supple Neck,No JVD,.  Lungs-  CTAB , good air movement bilaterally CV- S1, S2 normal, regular Abd-  +ve B.Sounds, Abd Soft, No tenderness,    Extremity/Skin:- No  edema,   good pedal pulses , bilateral upper extremity erythema is improving, without streaking, or fluctuance--- suspect inflammatory changes from prior IV site Psych-affect is appropriate, cognitive and memory deficits noted neuro-generalized weakness, no new focal deficits, no tremors,  moving all extremities   Data Review   CBC w Diff:  Lab Results  Component Value Date   WBC 7.8  03/14/2024   HGB 13.1 03/14/2024   HCT 40.0 03/14/2024   PLT 238 03/14/2024   LYMPHOPCT 36 03/11/2024   MONOPCT 8 03/11/2024   EOSPCT 5 03/11/2024   BASOPCT 1 03/11/2024    CMP:  Lab Results  Component Value Date   NA 132 (L) 03/19/2024   K 3.4 (L) 03/19/2024   CL 98 03/19/2024   CO2 26 03/19/2024   BUN 10 03/19/2024   CREATININE 0.51 03/19/2024   CREATININE 0.72 10/03/2016   PROT 6.8 03/11/2024   ALBUMIN 2.8 (L) 03/19/2024   BILITOT 0.6 03/11/2024   ALKPHOS 34 (L) 03/11/2024   AST 17 03/11/2024   ALT 15 03/11/2024  .  Total Discharge time is about 33 minutes  Shon Hale M.D on 03/19/2024 at 11:32 AM  Go to [email or web address] -  for contact info  [facility] - Office  [phone]

## 2024-03-19 NOTE — Progress Notes (Signed)
 Palliative: Mrs. Stammen is lying quietly in bed.  She appears chronically ill and somewhat frail.  She greets me, making and somewhat keeping eye contact.  She is alert, oriented to self only.  She is calm and cooperative, not fearful.  I do believe that she can make her basic needs known.  There is no family at bedside at this time.  I offer Mrs. Spearman something to eat.  She is able to take and hold a cup of applesauce, feeding herself.   Conference with attending, bedside nursing staff, transition of care team related to patient condition, needs, goals of care, disposition.  Plan: Continue to treat the treatable but no CPR or intubation.  No PEG-tube.  Short-term rehab at [facility] rehab with ultimate goal of returning home. DNR/goldenrod form on chart.        35 minutes  Lillia Carmel, NP Palliative medicine team Team phone [phone]

## 2024-03-20 DIAGNOSIS — J45909 Unspecified asthma, uncomplicated: Secondary | ICD-10-CM | POA: Diagnosis not present

## 2024-03-20 DIAGNOSIS — M6282 Rhabdomyolysis: Secondary | ICD-10-CM | POA: Diagnosis not present

## 2024-03-20 DIAGNOSIS — E039 Hypothyroidism, unspecified: Secondary | ICD-10-CM | POA: Diagnosis not present

## 2024-03-20 DIAGNOSIS — R262 Difficulty in walking, not elsewhere classified: Secondary | ICD-10-CM | POA: Diagnosis not present

## 2024-03-20 DIAGNOSIS — I639 Cerebral infarction, unspecified: Secondary | ICD-10-CM | POA: Diagnosis not present

## 2024-03-20 DIAGNOSIS — E119 Type 2 diabetes mellitus without complications: Secondary | ICD-10-CM | POA: Diagnosis not present

## 2024-03-20 DIAGNOSIS — R5381 Other malaise: Secondary | ICD-10-CM | POA: Diagnosis not present

## 2024-03-25 DIAGNOSIS — G9341 Metabolic encephalopathy: Secondary | ICD-10-CM | POA: Diagnosis not present

## 2024-03-25 DIAGNOSIS — R5381 Other malaise: Secondary | ICD-10-CM | POA: Diagnosis not present

## 2024-03-25 DIAGNOSIS — I679 Cerebrovascular disease, unspecified: Secondary | ICD-10-CM | POA: Diagnosis not present

## 2024-03-26 DIAGNOSIS — G9341 Metabolic encephalopathy: Secondary | ICD-10-CM | POA: Diagnosis not present

## 2024-03-26 DIAGNOSIS — R5381 Other malaise: Secondary | ICD-10-CM | POA: Diagnosis not present

## 2024-03-26 DIAGNOSIS — E039 Hypothyroidism, unspecified: Secondary | ICD-10-CM | POA: Diagnosis not present

## 2024-03-26 DIAGNOSIS — E119 Type 2 diabetes mellitus without complications: Secondary | ICD-10-CM | POA: Diagnosis not present

## 2024-03-26 DIAGNOSIS — I10 Essential (primary) hypertension: Secondary | ICD-10-CM | POA: Diagnosis not present

## 2024-03-26 DIAGNOSIS — I679 Cerebrovascular disease, unspecified: Secondary | ICD-10-CM | POA: Diagnosis not present

## 2024-03-27 ENCOUNTER — Other Ambulatory Visit: Payer: Self-pay | Admitting: *Deleted

## 2024-03-27 DIAGNOSIS — R0602 Shortness of breath: Secondary | ICD-10-CM | POA: Diagnosis not present

## 2024-03-27 DIAGNOSIS — R262 Difficulty in walking, not elsewhere classified: Secondary | ICD-10-CM | POA: Diagnosis not present

## 2024-03-27 DIAGNOSIS — I679 Cerebrovascular disease, unspecified: Secondary | ICD-10-CM | POA: Diagnosis not present

## 2024-03-27 DIAGNOSIS — E039 Hypothyroidism, unspecified: Secondary | ICD-10-CM | POA: Diagnosis not present

## 2024-03-27 DIAGNOSIS — R079 Chest pain, unspecified: Secondary | ICD-10-CM | POA: Diagnosis not present

## 2024-03-27 DIAGNOSIS — Z20822 Contact with and (suspected) exposure to covid-19: Secondary | ICD-10-CM | POA: Diagnosis not present

## 2024-03-27 DIAGNOSIS — R1312 Dysphagia, oropharyngeal phase: Secondary | ICD-10-CM | POA: Diagnosis not present

## 2024-03-27 DIAGNOSIS — G9341 Metabolic encephalopathy: Secondary | ICD-10-CM | POA: Diagnosis not present

## 2024-03-27 DIAGNOSIS — E278 Other specified disorders of adrenal gland: Secondary | ICD-10-CM | POA: Diagnosis not present

## 2024-03-27 DIAGNOSIS — I10 Essential (primary) hypertension: Secondary | ICD-10-CM | POA: Diagnosis not present

## 2024-03-27 DIAGNOSIS — Z91041 Radiographic dye allergy status: Secondary | ICD-10-CM | POA: Diagnosis not present

## 2024-03-27 DIAGNOSIS — K8689 Other specified diseases of pancreas: Secondary | ICD-10-CM | POA: Diagnosis not present

## 2024-03-27 DIAGNOSIS — R0789 Other chest pain: Secondary | ICD-10-CM | POA: Diagnosis not present

## 2024-03-27 DIAGNOSIS — E441 Mild protein-calorie malnutrition: Secondary | ICD-10-CM | POA: Diagnosis not present

## 2024-03-27 DIAGNOSIS — J45909 Unspecified asthma, uncomplicated: Secondary | ICD-10-CM | POA: Diagnosis not present

## 2024-03-27 DIAGNOSIS — R10816 Epigastric abdominal tenderness: Secondary | ICD-10-CM | POA: Diagnosis not present

## 2024-03-27 DIAGNOSIS — R410 Disorientation, unspecified: Secondary | ICD-10-CM | POA: Diagnosis not present

## 2024-03-27 DIAGNOSIS — D519 Vitamin B12 deficiency anemia, unspecified: Secondary | ICD-10-CM | POA: Diagnosis not present

## 2024-03-27 DIAGNOSIS — Z743 Need for continuous supervision: Secondary | ICD-10-CM | POA: Diagnosis not present

## 2024-03-27 DIAGNOSIS — J9811 Atelectasis: Secondary | ICD-10-CM | POA: Diagnosis not present

## 2024-03-27 DIAGNOSIS — Z880 Allergy status to penicillin: Secondary | ICD-10-CM | POA: Diagnosis not present

## 2024-03-27 DIAGNOSIS — Z8673 Personal history of transient ischemic attack (TIA), and cerebral infarction without residual deficits: Secondary | ICD-10-CM | POA: Diagnosis not present

## 2024-03-27 DIAGNOSIS — E119 Type 2 diabetes mellitus without complications: Secondary | ICD-10-CM | POA: Diagnosis not present

## 2024-03-27 DIAGNOSIS — M6281 Muscle weakness (generalized): Secondary | ICD-10-CM | POA: Diagnosis not present

## 2024-03-27 DIAGNOSIS — K439 Ventral hernia without obstruction or gangrene: Secondary | ICD-10-CM | POA: Diagnosis not present

## 2024-03-27 DIAGNOSIS — R935 Abnormal findings on diagnostic imaging of other abdominal regions, including retroperitoneum: Secondary | ICD-10-CM | POA: Diagnosis not present

## 2024-03-27 DIAGNOSIS — K219 Gastro-esophageal reflux disease without esophagitis: Secondary | ICD-10-CM | POA: Diagnosis not present

## 2024-03-27 NOTE — Consult Note (Signed)
 [facility] Liaison Note  03/27/2024  LARYSA PALL [birth year] [id number]  Location: RN Hospital Liaison screened the patient remotely at [facility].  Insurance: [employer]   JURY CASERTA is a 83 y.o. female who is a Primary Care Patient of Margo Aye, Kathleene Hazel, MD The patient was screened for readmission hospitalization with noted medium risk score for unplanned readmission risk with 2 IP in 6 months.  The patient was assessed for potential Care Management service needs for post hospital transition for care coordination. Review of patient's electronic medical record reveals patient as admitted for acute encephalopathy. Pt recommended for SNF level of care and discharged to [facility]. Liaison will collaborate with PAC-RN concerning pt's discharge disposition.  Plan: [facility] Liaison will continue to follow progress and disposition to asess for post hospital community care coordination/management needs.  Referral request for community care coordination: Liaison will collaborate with PAC-RN concerning pt's discharge disposition.   VBCI Care Management/Population Health does not replace or interfere with any arrangements made by the Inpatient Transition of Care team.   For questions contact:   Elliot Cousin, RN, BSN Hospital Liaison [place]   [facility], Population Health Office Hours MTWF  8:00 am-6:00 pm Direct Dial: [phone] mobile [email or web address]

## 2024-03-27 NOTE — Patient Outreach (Signed)
 Mrs. [name] White readmitted to [facility]. Screening for potential complex care management services as benefit of health plan and primary care provider.  Collaboration with [name] White, Admissions Director. Mrs. [name] White' recently returned to [facility] from  hospital ED. Transition plans pending. Daughter [name] White is primary contact.   Will continue to follow and plan outreach to daughter/ DPR [name] White as appropriate, to discuss complex care management services.    [name] Noble, MSN, RN, BSN [place]  [facility], Healthy Communities RN Post- Acute Care Manager Direct Dial: [phone]

## 2024-04-02 DIAGNOSIS — E119 Type 2 diabetes mellitus without complications: Secondary | ICD-10-CM | POA: Diagnosis not present

## 2024-04-02 DIAGNOSIS — R5381 Other malaise: Secondary | ICD-10-CM | POA: Diagnosis not present

## 2024-04-02 DIAGNOSIS — I10 Essential (primary) hypertension: Secondary | ICD-10-CM | POA: Diagnosis not present

## 2024-04-02 DIAGNOSIS — I679 Cerebrovascular disease, unspecified: Secondary | ICD-10-CM | POA: Diagnosis not present

## 2024-04-03 DIAGNOSIS — E1140 Type 2 diabetes mellitus with diabetic neuropathy, unspecified: Secondary | ICD-10-CM | POA: Diagnosis not present

## 2024-04-03 DIAGNOSIS — G8929 Other chronic pain: Secondary | ICD-10-CM | POA: Diagnosis not present

## 2024-04-03 DIAGNOSIS — E119 Type 2 diabetes mellitus without complications: Secondary | ICD-10-CM | POA: Diagnosis not present

## 2024-04-08 DIAGNOSIS — G8929 Other chronic pain: Secondary | ICD-10-CM | POA: Diagnosis not present

## 2024-04-08 DIAGNOSIS — E039 Hypothyroidism, unspecified: Secondary | ICD-10-CM | POA: Diagnosis not present

## 2024-04-08 DIAGNOSIS — I10 Essential (primary) hypertension: Secondary | ICD-10-CM | POA: Diagnosis not present

## 2024-04-08 DIAGNOSIS — G9341 Metabolic encephalopathy: Secondary | ICD-10-CM | POA: Diagnosis not present

## 2024-04-08 DIAGNOSIS — R262 Difficulty in walking, not elsewhere classified: Secondary | ICD-10-CM | POA: Diagnosis not present

## 2024-04-08 DIAGNOSIS — R5381 Other malaise: Secondary | ICD-10-CM | POA: Diagnosis not present

## 2024-04-08 DIAGNOSIS — E119 Type 2 diabetes mellitus without complications: Secondary | ICD-10-CM | POA: Diagnosis not present

## 2024-04-08 DIAGNOSIS — I679 Cerebrovascular disease, unspecified: Secondary | ICD-10-CM | POA: Diagnosis not present

## 2024-04-17 DIAGNOSIS — J45909 Unspecified asthma, uncomplicated: Secondary | ICD-10-CM | POA: Diagnosis not present

## 2024-04-17 DIAGNOSIS — L89159 Pressure ulcer of sacral region, unspecified stage: Secondary | ICD-10-CM | POA: Diagnosis not present

## 2024-04-17 DIAGNOSIS — L89152 Pressure ulcer of sacral region, stage 2: Secondary | ICD-10-CM | POA: Diagnosis not present

## 2024-04-17 DIAGNOSIS — L8990 Pressure ulcer of unspecified site, unspecified stage: Secondary | ICD-10-CM | POA: Diagnosis not present

## 2024-04-17 DIAGNOSIS — Z8673 Personal history of transient ischemic attack (TIA), and cerebral infarction without residual deficits: Secondary | ICD-10-CM | POA: Diagnosis not present

## 2024-04-17 DIAGNOSIS — G894 Chronic pain syndrome: Secondary | ICD-10-CM | POA: Diagnosis not present

## 2024-04-17 DIAGNOSIS — G43909 Migraine, unspecified, not intractable, without status migrainosus: Secondary | ICD-10-CM | POA: Diagnosis not present

## 2024-04-17 DIAGNOSIS — E1142 Type 2 diabetes mellitus with diabetic polyneuropathy: Secondary | ICD-10-CM | POA: Diagnosis not present

## 2024-04-17 DIAGNOSIS — G9349 Other encephalopathy: Secondary | ICD-10-CM | POA: Diagnosis not present

## 2024-04-17 DIAGNOSIS — Z09 Encounter for follow-up examination after completed treatment for conditions other than malignant neoplasm: Secondary | ICD-10-CM | POA: Diagnosis not present

## 2024-04-17 DIAGNOSIS — I69398 Other sequelae of cerebral infarction: Secondary | ICD-10-CM | POA: Diagnosis not present

## 2024-04-19 DIAGNOSIS — I69398 Other sequelae of cerebral infarction: Secondary | ICD-10-CM | POA: Diagnosis not present

## 2024-04-19 DIAGNOSIS — Z6821 Body mass index (BMI) 21.0-21.9, adult: Secondary | ICD-10-CM | POA: Diagnosis not present

## 2024-04-19 DIAGNOSIS — N390 Urinary tract infection, site not specified: Secondary | ICD-10-CM | POA: Diagnosis not present

## 2024-04-19 DIAGNOSIS — J45909 Unspecified asthma, uncomplicated: Secondary | ICD-10-CM | POA: Diagnosis not present

## 2024-04-19 DIAGNOSIS — G9349 Other encephalopathy: Secondary | ICD-10-CM | POA: Diagnosis not present

## 2024-04-19 DIAGNOSIS — L89152 Pressure ulcer of sacral region, stage 2: Secondary | ICD-10-CM | POA: Diagnosis not present

## 2024-04-19 DIAGNOSIS — E1142 Type 2 diabetes mellitus with diabetic polyneuropathy: Secondary | ICD-10-CM | POA: Diagnosis not present

## 2024-04-22 DIAGNOSIS — I69398 Other sequelae of cerebral infarction: Secondary | ICD-10-CM | POA: Diagnosis not present

## 2024-04-22 DIAGNOSIS — E1142 Type 2 diabetes mellitus with diabetic polyneuropathy: Secondary | ICD-10-CM | POA: Diagnosis not present

## 2024-04-22 DIAGNOSIS — G9349 Other encephalopathy: Secondary | ICD-10-CM | POA: Diagnosis not present

## 2024-04-22 DIAGNOSIS — J45909 Unspecified asthma, uncomplicated: Secondary | ICD-10-CM | POA: Diagnosis not present

## 2024-04-22 DIAGNOSIS — L89152 Pressure ulcer of sacral region, stage 2: Secondary | ICD-10-CM | POA: Diagnosis not present

## 2024-04-23 ENCOUNTER — Other Ambulatory Visit: Payer: Self-pay | Admitting: *Deleted

## 2024-04-23 NOTE — Patient Outreach (Signed)
 Post- Acute Care Manager follow up. Verified in [facility] Mrs. Moronta transitioned from [place] SNF to ALF on 04/16/24. Screening for potential complex care management services as benefit of health plan and primary care provider.  Secure communication sent to [employer] social work team to confirm disposition.   Nolberto Batty, MSN, RN, BSN [place]  [facility], Healthy Communities RN Post- Acute Care Manager Direct Dial: [phone]

## 2024-04-24 ENCOUNTER — Emergency Department (HOSPITAL_COMMUNITY)

## 2024-04-24 ENCOUNTER — Encounter (HOSPITAL_COMMUNITY): Payer: Self-pay | Admitting: *Deleted

## 2024-04-24 ENCOUNTER — Other Ambulatory Visit: Payer: Self-pay

## 2024-04-24 ENCOUNTER — Inpatient Hospital Stay (HOSPITAL_COMMUNITY)
Admission: EM | Admit: 2024-04-24 | Discharge: 2024-05-01 | DRG: 871 | Disposition: A | Discharge status: Skilled Nursing Facility | Source: Skilled Nursing Facility | Attending: Family Medicine | Admitting: Family Medicine

## 2024-04-24 DIAGNOSIS — M810 Age-related osteoporosis without current pathological fracture: Secondary | ICD-10-CM | POA: Diagnosis not present

## 2024-04-24 DIAGNOSIS — R6521 Severe sepsis with septic shock: Secondary | ICD-10-CM | POA: Diagnosis not present

## 2024-04-24 DIAGNOSIS — F419 Anxiety disorder, unspecified: Secondary | ICD-10-CM | POA: Diagnosis present

## 2024-04-24 DIAGNOSIS — Z7951 Long term (current) use of inhaled steroids: Secondary | ICD-10-CM | POA: Diagnosis not present

## 2024-04-24 DIAGNOSIS — E8770 Fluid overload, unspecified: Secondary | ICD-10-CM | POA: Diagnosis not present

## 2024-04-24 DIAGNOSIS — E1165 Type 2 diabetes mellitus with hyperglycemia: Secondary | ICD-10-CM | POA: Diagnosis not present

## 2024-04-24 DIAGNOSIS — E871 Hypo-osmolality and hyponatremia: Secondary | ICD-10-CM | POA: Diagnosis present

## 2024-04-24 DIAGNOSIS — L89151 Pressure ulcer of sacral region, stage 1: Secondary | ICD-10-CM | POA: Diagnosis present

## 2024-04-24 DIAGNOSIS — I639 Cerebral infarction, unspecified: Secondary | ICD-10-CM | POA: Diagnosis not present

## 2024-04-24 DIAGNOSIS — J90 Pleural effusion, not elsewhere classified: Secondary | ICD-10-CM | POA: Diagnosis not present

## 2024-04-24 DIAGNOSIS — Z1152 Encounter for screening for COVID-19: Secondary | ICD-10-CM | POA: Diagnosis not present

## 2024-04-24 DIAGNOSIS — E119 Type 2 diabetes mellitus without complications: Secondary | ICD-10-CM

## 2024-04-24 DIAGNOSIS — J189 Pneumonia, unspecified organism: Secondary | ICD-10-CM | POA: Diagnosis present

## 2024-04-24 DIAGNOSIS — Z880 Allergy status to penicillin: Secondary | ICD-10-CM

## 2024-04-24 DIAGNOSIS — Z8672 Personal history of thrombophlebitis: Secondary | ICD-10-CM

## 2024-04-24 DIAGNOSIS — Z87440 Personal history of urinary (tract) infections: Secondary | ICD-10-CM

## 2024-04-24 DIAGNOSIS — J45901 Unspecified asthma with (acute) exacerbation: Secondary | ICD-10-CM

## 2024-04-24 DIAGNOSIS — Z7982 Long term (current) use of aspirin: Secondary | ICD-10-CM | POA: Diagnosis not present

## 2024-04-24 DIAGNOSIS — J441 Chronic obstructive pulmonary disease with (acute) exacerbation: Secondary | ICD-10-CM | POA: Diagnosis not present

## 2024-04-24 DIAGNOSIS — R410 Disorientation, unspecified: Secondary | ICD-10-CM | POA: Diagnosis not present

## 2024-04-24 DIAGNOSIS — Z8673 Personal history of transient ischemic attack (TIA), and cerebral infarction without residual deficits: Secondary | ICD-10-CM

## 2024-04-24 DIAGNOSIS — R059 Cough, unspecified: Secondary | ICD-10-CM | POA: Diagnosis not present

## 2024-04-24 DIAGNOSIS — G9341 Metabolic encephalopathy: Secondary | ICD-10-CM | POA: Diagnosis not present

## 2024-04-24 DIAGNOSIS — E7800 Pure hypercholesterolemia, unspecified: Secondary | ICD-10-CM | POA: Diagnosis present

## 2024-04-24 DIAGNOSIS — D3502 Benign neoplasm of left adrenal gland: Secondary | ICD-10-CM | POA: Diagnosis not present

## 2024-04-24 DIAGNOSIS — Z66 Do not resuscitate: Secondary | ICD-10-CM | POA: Diagnosis present

## 2024-04-24 DIAGNOSIS — Z7902 Long term (current) use of antithrombotics/antiplatelets: Secondary | ICD-10-CM

## 2024-04-24 DIAGNOSIS — I10 Essential (primary) hypertension: Secondary | ICD-10-CM | POA: Diagnosis not present

## 2024-04-24 DIAGNOSIS — A419 Sepsis, unspecified organism: Secondary | ICD-10-CM | POA: Diagnosis not present

## 2024-04-24 DIAGNOSIS — Z90710 Acquired absence of both cervix and uterus: Secondary | ICD-10-CM

## 2024-04-24 DIAGNOSIS — Z9049 Acquired absence of other specified parts of digestive tract: Secondary | ICD-10-CM

## 2024-04-24 DIAGNOSIS — L8991 Pressure ulcer of unspecified site, stage 1: Secondary | ICD-10-CM | POA: Diagnosis not present

## 2024-04-24 DIAGNOSIS — Z79899 Other long term (current) drug therapy: Secondary | ICD-10-CM

## 2024-04-24 DIAGNOSIS — Z825 Family history of asthma and other chronic lower respiratory diseases: Secondary | ICD-10-CM

## 2024-04-24 DIAGNOSIS — J440 Chronic obstructive pulmonary disease with acute lower respiratory infection: Secondary | ICD-10-CM | POA: Diagnosis present

## 2024-04-24 DIAGNOSIS — Z79890 Hormone replacement therapy (postmenopausal): Secondary | ICD-10-CM | POA: Diagnosis not present

## 2024-04-24 DIAGNOSIS — Z743 Need for continuous supervision: Secondary | ICD-10-CM | POA: Diagnosis not present

## 2024-04-24 DIAGNOSIS — F32A Depression, unspecified: Secondary | ICD-10-CM | POA: Diagnosis present

## 2024-04-24 DIAGNOSIS — I700 Atherosclerosis of aorta: Secondary | ICD-10-CM | POA: Diagnosis not present

## 2024-04-24 DIAGNOSIS — I5031 Acute diastolic (congestive) heart failure: Secondary | ICD-10-CM | POA: Diagnosis not present

## 2024-04-24 DIAGNOSIS — E870 Hyperosmolality and hypernatremia: Secondary | ICD-10-CM | POA: Diagnosis not present

## 2024-04-24 DIAGNOSIS — E039 Hypothyroidism, unspecified: Secondary | ICD-10-CM | POA: Diagnosis not present

## 2024-04-24 DIAGNOSIS — R6889 Other general symptoms and signs: Secondary | ICD-10-CM | POA: Diagnosis not present

## 2024-04-24 DIAGNOSIS — Z883 Allergy status to other anti-infective agents status: Secondary | ICD-10-CM

## 2024-04-24 DIAGNOSIS — E1142 Type 2 diabetes mellitus with diabetic polyneuropathy: Secondary | ICD-10-CM

## 2024-04-24 DIAGNOSIS — L8990 Pressure ulcer of unspecified site, unspecified stage: Secondary | ICD-10-CM | POA: Diagnosis present

## 2024-04-24 DIAGNOSIS — Z8249 Family history of ischemic heart disease and other diseases of the circulatory system: Secondary | ICD-10-CM

## 2024-04-24 DIAGNOSIS — T380X5A Adverse effect of glucocorticoids and synthetic analogues, initial encounter: Secondary | ICD-10-CM | POA: Diagnosis not present

## 2024-04-24 DIAGNOSIS — I672 Cerebral atherosclerosis: Secondary | ICD-10-CM | POA: Diagnosis not present

## 2024-04-24 DIAGNOSIS — R531 Weakness: Secondary | ICD-10-CM | POA: Diagnosis not present

## 2024-04-24 DIAGNOSIS — R509 Fever, unspecified: Secondary | ICD-10-CM | POA: Diagnosis not present

## 2024-04-24 DIAGNOSIS — J9601 Acute respiratory failure with hypoxia: Secondary | ICD-10-CM | POA: Diagnosis not present

## 2024-04-24 DIAGNOSIS — R918 Other nonspecific abnormal finding of lung field: Secondary | ICD-10-CM | POA: Diagnosis not present

## 2024-04-24 DIAGNOSIS — Z823 Family history of stroke: Secondary | ICD-10-CM

## 2024-04-24 DIAGNOSIS — R0902 Hypoxemia: Secondary | ICD-10-CM | POA: Diagnosis not present

## 2024-04-24 DIAGNOSIS — J984 Other disorders of lung: Secondary | ICD-10-CM | POA: Diagnosis not present

## 2024-04-24 DIAGNOSIS — R4182 Altered mental status, unspecified: Secondary | ICD-10-CM

## 2024-04-24 DIAGNOSIS — R590 Localized enlarged lymph nodes: Secondary | ICD-10-CM | POA: Diagnosis not present

## 2024-04-24 DIAGNOSIS — J811 Chronic pulmonary edema: Secondary | ICD-10-CM | POA: Diagnosis not present

## 2024-04-24 DIAGNOSIS — Z9841 Cataract extraction status, right eye: Secondary | ICD-10-CM

## 2024-04-24 DIAGNOSIS — R6520 Severe sepsis without septic shock: Secondary | ICD-10-CM | POA: Diagnosis not present

## 2024-04-24 LAB — RESP PANEL BY RT-PCR (RSV, FLU A&B, COVID)  RVPGX2
Influenza A by PCR: NEGATIVE
Influenza B by PCR: NEGATIVE
Resp Syncytial Virus by PCR: NEGATIVE
SARS Coronavirus 2 by RT PCR: NEGATIVE

## 2024-04-24 LAB — URINALYSIS, W/ REFLEX TO CULTURE (INFECTION SUSPECTED)
Bacteria, UA: NONE SEEN
Bilirubin Urine: NEGATIVE
Glucose, UA: NEGATIVE mg/dL
Hgb urine dipstick: NEGATIVE
Ketones, ur: 5 mg/dL — AB
Leukocytes,Ua: NEGATIVE
Nitrite: NEGATIVE
Protein, ur: NEGATIVE mg/dL
Specific Gravity, Urine: 1.014 (ref 1.005–1.030)
pH: 5 (ref 5.0–8.0)

## 2024-04-24 LAB — COMPREHENSIVE METABOLIC PANEL WITH GFR
ALT: 20 U/L (ref 0–44)
AST: 26 U/L (ref 15–41)
Albumin: 3.4 g/dL — ABNORMAL LOW (ref 3.5–5.0)
Alkaline Phosphatase: 57 U/L (ref 38–126)
Anion gap: 11 (ref 5–15)
BUN: 12 mg/dL (ref 8–23)
CO2: 24 mmol/L (ref 22–32)
Calcium: 8.7 mg/dL — ABNORMAL LOW (ref 8.9–10.3)
Chloride: 93 mmol/L — ABNORMAL LOW (ref 98–111)
Creatinine, Ser: 0.63 mg/dL (ref 0.44–1.00)
GFR, Estimated: >60 mL/min (ref >60)
Glucose, Bld: 119 mg/dL — ABNORMAL HIGH (ref 70–99)
Potassium: 3.9 mmol/L (ref 3.5–5.1)
Sodium: 128 mmol/L — ABNORMAL LOW (ref 135–145)
Total Bilirubin: 0.9 mg/dL (ref 0.0–1.2)
Total Protein: 7.1 g/dL (ref 6.5–8.1)

## 2024-04-24 LAB — CBC WITH DIFFERENTIAL/PLATELET
Abs Immature Granulocytes: 0.03 10*3/uL (ref 0.00–0.07)
Basophils Absolute: 0 10*3/uL (ref 0.0–0.1)
Basophils Relative: 0 %
Eosinophils Absolute: 0.1 10*3/uL (ref 0.0–0.5)
Eosinophils Relative: 2 %
HCT: 41.2 % (ref 36.0–46.0)
Hemoglobin: 13.7 g/dL (ref 12.0–15.0)
Immature Granulocytes: 0 %
Lymphocytes Relative: 17 %
Lymphs Abs: 1.4 10*3/uL (ref 0.7–4.0)
MCH: 29.2 pg (ref 26.0–34.0)
MCHC: 33.3 g/dL (ref 30.0–36.0)
MCV: 87.8 fL (ref 80.0–100.0)
Monocytes Absolute: 0.4 10*3/uL (ref 0.1–1.0)
Monocytes Relative: 5 %
Neutro Abs: 6.1 10*3/uL (ref 1.7–7.7)
Neutrophils Relative %: 76 %
Platelets: 260 10*3/uL (ref 150–400)
RBC: 4.69 MIL/uL (ref 3.87–5.11)
RDW: 15.4 % (ref 11.5–15.5)
WBC: 8.1 10*3/uL (ref 4.0–10.5)
nRBC: 0 % (ref 0.0–0.2)

## 2024-04-24 LAB — LACTIC ACID, PLASMA
Lactic Acid, Venous: 1.6 mmol/L (ref 0.5–1.9)
Lactic Acid, Venous: 2 mmol/L (ref 0.5–1.9)

## 2024-04-24 LAB — GLUCOSE, CAPILLARY: Glucose-Capillary: 178 mg/dL — ABNORMAL HIGH (ref 70–99)

## 2024-04-24 LAB — BLOOD GAS, VENOUS
Acid-Base Excess: 3.4 mmol/L — ABNORMAL HIGH (ref 0.0–2.0)
Bicarbonate: 28.5 mmol/L — ABNORMAL HIGH (ref 20.0–28.0)
Drawn by: 69441
O2 Saturation: 71.1 %
Patient temperature: 38.4
pCO2, Ven: 47 mmHg (ref 44–60)
pH, Ven: 7.4 (ref 7.25–7.43)
pO2, Ven: 43 mmHg (ref 32–45)

## 2024-04-24 LAB — MRSA NEXT GEN BY PCR, NASAL: MRSA by PCR Next Gen: NOT DETECTED

## 2024-04-24 MED ORDER — SODIUM CHLORIDE 0.9 % IV SOLN
500.0000 mg | Freq: Once | INTRAVENOUS | Status: AC
  Administered 2024-04-24: 500 mg via INTRAVENOUS
  Filled 2024-04-24: qty 5

## 2024-04-24 MED ORDER — ACETAMINOPHEN 650 MG RE SUPP: 650.0000 mg | Freq: Four times a day (QID) | RECTAL | Status: DC | PRN

## 2024-04-24 MED ORDER — ALBUTEROL SULFATE (2.5 MG/3ML) 0.083% IN NEBU
2.5000 mg | INHALATION_SOLUTION | RESPIRATORY_TRACT | Status: DC | PRN
  Administered 2024-04-25 – 2024-04-26 (×2): 2.5 mg via RESPIRATORY_TRACT
  Filled 2024-04-24 (×2): qty 3

## 2024-04-24 MED ORDER — NOREPINEPHRINE 4 MG/250ML-% IV SOLN
2.0000 ug/min | INTRAVENOUS | Status: DC
  Administered 2024-04-24: 2 ug/min via INTRAVENOUS
  Filled 2024-04-24: qty 250

## 2024-04-24 MED ORDER — SODIUM CHLORIDE 0.9 % IV SOLN
500.0000 mg | INTRAVENOUS | Status: DC
  Administered 2024-04-25 – 2024-04-28 (×4): 500 mg via INTRAVENOUS
  Filled 2024-04-24 (×5): qty 5

## 2024-04-24 MED ORDER — ONDANSETRON HCL 4 MG PO TABS: 4.0000 mg | ORAL_TABLET | Freq: Four times a day (QID) | ORAL | Status: DC | PRN

## 2024-04-24 MED ORDER — INSULIN ASPART 100 UNIT/ML IJ SOLN
0.0000 [IU] | Freq: Every day | INTRAMUSCULAR | Status: DC
  Administered 2024-04-24: 2 [IU] via SUBCUTANEOUS
  Administered 2024-04-27: 3 [IU] via SUBCUTANEOUS
  Administered 2024-04-28: 2 [IU] via SUBCUTANEOUS
  Administered 2024-04-29: 4 [IU] via SUBCUTANEOUS
  Administered 2024-04-30: 1 [IU] via SUBCUTANEOUS

## 2024-04-24 MED ORDER — IPRATROPIUM-ALBUTEROL 0.5-2.5 (3) MG/3ML IN SOLN
3.0000 mL | Freq: Once | RESPIRATORY_TRACT | Status: AC
  Administered 2024-04-24: 3 mL via RESPIRATORY_TRACT
  Filled 2024-04-24: qty 3

## 2024-04-24 MED ORDER — SODIUM CHLORIDE 0.9 % IV BOLUS
500.0000 mL | Freq: Once | INTRAVENOUS | Status: AC
  Administered 2024-04-24: 500 mL via INTRAVENOUS

## 2024-04-24 MED ORDER — SODIUM CHLORIDE 0.9 % IV SOLN
2.0000 g | INTRAVENOUS | Status: DC
  Administered 2024-04-25 – 2024-04-28 (×4): 2 g via INTRAVENOUS
  Filled 2024-04-24 (×5): qty 20

## 2024-04-24 MED ORDER — ACETAMINOPHEN 650 MG RE SUPP
650.0000 mg | Freq: Once | RECTAL | Status: AC
  Administered 2024-04-24: 650 mg via RECTAL
  Filled 2024-04-24: qty 1

## 2024-04-24 MED ORDER — ACETAMINOPHEN 500 MG PO TABS
1000.0000 mg | ORAL_TABLET | Freq: Once | ORAL | Status: DC
Start: 2024-04-24 — End: 2024-04-24
  Filled 2024-04-24: qty 2

## 2024-04-24 MED ORDER — SODIUM CHLORIDE 0.9 % IV BOLUS
500.0000 mL | Freq: Once | INTRAVENOUS | Status: AC
Start: 2024-04-24 — End: 2024-04-24
  Administered 2024-04-24: 500 mL via INTRAVENOUS

## 2024-04-24 MED ORDER — LACTATED RINGERS IV BOLUS
1000.0000 mL | Freq: Once | INTRAVENOUS | Status: AC
  Administered 2024-04-24: 1000 mL via INTRAVENOUS

## 2024-04-24 MED ORDER — GUAIFENESIN-DM 100-10 MG/5ML PO SYRP
15.0000 mL | ORAL_SOLUTION | Freq: Three times a day (TID) | ORAL | Status: AC
  Administered 2024-04-24 – 2024-04-25 (×3): 15 mL via ORAL
  Filled 2024-04-24 (×3): qty 15

## 2024-04-24 MED ORDER — SODIUM CHLORIDE 0.9 % IV SOLN: INTRAVENOUS | Status: AC

## 2024-04-24 MED ORDER — SODIUM CHLORIDE 0.9 % IV SOLN
2.0000 g | Freq: Once | INTRAVENOUS | Status: AC
  Administered 2024-04-24: 2 g via INTRAVENOUS
  Filled 2024-04-24: qty 12.5

## 2024-04-24 MED ORDER — ENOXAPARIN SODIUM 40 MG/0.4ML IJ SOSY
40.0000 mg | PREFILLED_SYRINGE | INTRAMUSCULAR | Status: DC
  Administered 2024-04-24 – 2024-04-30 (×7): 40 mg via SUBCUTANEOUS
  Filled 2024-04-24 (×7): qty 0.4

## 2024-04-24 MED ORDER — SODIUM CHLORIDE 0.9 % IV SOLN
250.0000 mL | INTRAVENOUS | Status: AC
  Administered 2024-04-24: 250 mL via INTRAVENOUS

## 2024-04-24 MED ORDER — CHLORHEXIDINE GLUCONATE CLOTH 2 % EX PADS
6.0000 | MEDICATED_PAD | Freq: Every day | CUTANEOUS | Status: DC
  Administered 2024-04-24 – 2024-05-01 (×6): 6 via TOPICAL

## 2024-04-24 MED ORDER — SODIUM CHLORIDE 0.9 % IV SOLN
2.0000 g | Freq: Once | INTRAVENOUS | Status: AC
  Administered 2024-04-24: 2 g via INTRAVENOUS
  Filled 2024-04-24: qty 20

## 2024-04-24 MED ORDER — VANCOMYCIN HCL IN DEXTROSE 1-5 GM/200ML-% IV SOLN: 1000.0000 mg | INTRAVENOUS | Status: DC

## 2024-04-24 MED ORDER — ACETAMINOPHEN 325 MG PO TABS
650.0000 mg | ORAL_TABLET | Freq: Four times a day (QID) | ORAL | Status: DC | PRN
  Administered 2024-04-25 – 2024-04-30 (×5): 650 mg via ORAL
  Filled 2024-04-24 (×5): qty 2

## 2024-04-24 MED ORDER — INSULIN ASPART 100 UNIT/ML IJ SOLN
0.0000 [IU] | Freq: Three times a day (TID) | INTRAMUSCULAR | Status: DC
  Administered 2024-04-25 – 2024-04-27 (×3): 2 [IU] via SUBCUTANEOUS
  Administered 2024-04-27: 3 [IU] via SUBCUTANEOUS
  Administered 2024-04-27: 7 [IU] via SUBCUTANEOUS
  Administered 2024-04-28: 2 [IU] via SUBCUTANEOUS
  Administered 2024-04-28: 9 [IU] via SUBCUTANEOUS
  Administered 2024-04-28 – 2024-04-29 (×2): 3 [IU] via SUBCUTANEOUS
  Administered 2024-04-29: 1 [IU] via SUBCUTANEOUS
  Administered 2024-04-29 – 2024-04-30 (×3): 5 [IU] via SUBCUTANEOUS
  Administered 2024-04-30: 9 [IU] via SUBCUTANEOUS
  Administered 2024-05-01: 5 [IU] via SUBCUTANEOUS
  Administered 2024-05-01: 9 [IU] via SUBCUTANEOUS

## 2024-04-24 MED ORDER — POLYETHYLENE GLYCOL 3350 17 G PO PACK: 17.0000 g | PACK | Freq: Every day | ORAL | Status: DC | PRN

## 2024-04-24 MED ORDER — SODIUM CHLORIDE 0.9 % IV SOLN: INTRAVENOUS | Status: DC

## 2024-04-24 MED ORDER — VANCOMYCIN HCL 1250 MG/250ML IV SOLN
1250.0000 mg | Freq: Once | INTRAVENOUS | Status: AC
  Administered 2024-04-24: 1250 mg via INTRAVENOUS
  Filled 2024-04-24: qty 250

## 2024-04-24 MED ORDER — ONDANSETRON HCL 4 MG/2ML IJ SOLN
4.0000 mg | Freq: Four times a day (QID) | INTRAMUSCULAR | Status: DC | PRN
Start: 2024-04-24 — End: 2024-05-01

## 2024-04-24 MED ORDER — METHYLPREDNISOLONE SODIUM SUCC 125 MG IJ SOLR
125.0000 mg | Freq: Once | INTRAMUSCULAR | Status: AC
  Administered 2024-04-24: 125 mg via INTRAVENOUS
  Filled 2024-04-24: qty 2

## 2024-04-24 NOTE — Assessment & Plan Note (Addendum)
 Currently hypotensive requiring pressors - Hold Carvedilol 

## 2024-04-24 NOTE — Consult Note (Signed)
 Pharmacy Antibiotic Note  ASSESSMENT: 83 y.o. female with PMH including CVA, DM, UTI, pituitary tumor, asthma is presenting with sepsis. Symptoms include cough, fever (Tmax 101.1 while here), and confusion. CXR demonstrates left lung nodular density concerning for pneumonia and CTA chest also consistent with pneumonia, potentially bilateral. Pharmacy has been consulted to manage vancomycin  dosing. Patient also receiving ceftriaxone  and azithromycin.  Patient measurements: Height: 5\' 5"  (165.1 cm) Weight: 61.3 kg (135 lb 2.3 oz) IBW/kg (Calculated) : 57  Vital signs: Temp: 101.1 F (38.4 C) (04/30 1100) Temp Source: Rectal (04/30 1100) BP: 137/50 (04/30 1534) Pulse Rate: 75 (04/30 1534) Recent Labs  Lab 04/24/24 1113  WBC 8.1  CREATININE 0.63   Estimated Creatinine Clearance: 48.8 mL/min (by C-G formula based on SCr of 0.63 mg/dL).  Allergies: Allergies  Allergen Reactions   Betadine [Povidone Iodine] Hives   Penicillins Rash    Antimicrobials this admission: Ceftriaxone  4/30 >>  Vancomycin  4/30 >> Azithromycin 4/30 >>  Dose adjustments this admission: N/A  Microbiology results: 4/30 BCx: in process 4/30 Flu/COVID/RSV: negative  4/30 MRSA PCR: ordered  PLAN: Administer vancomycin  1250mg  IV x 1 as a loading dose followed by 1000mg  IV q24H thereafter eAUC 505, Cmax 34, Cmin 12 Scr 0.8, IBW, Vd 0.72 L/kg Follow up culture results to assess for antibiotic optimization. Monitor renal function to assess for any necessary antibiotic dosing changes.   Thank you for allowing pharmacy to be a part of this patient's care.  Will M. Alva Jewels, PharmD Clinical Pharmacist 04/24/2024 3:43 PM

## 2024-04-24 NOTE — Assessment & Plan Note (Signed)
 Resume Synthroid in a.m.

## 2024-04-24 NOTE — ED Notes (Signed)
 Pt's bp treading down. EDP notified

## 2024-04-24 NOTE — Assessment & Plan Note (Signed)
 Med reconciliation pending, for now hold all psychoactive meds with altered mental status.

## 2024-04-24 NOTE — Assessment & Plan Note (Addendum)
 Multifocal pneumonia with septic shock and acute hypoxic respiratory failure. CT chest shows multifocal pneumonia.  COVID influenza RSV negative.  MRSA negative. -Continue IV ceftriaxone  and azithromycin -Follow-up blood cultures - Check urine Legionella -Urine strep - Bronchodilators, mucolytics

## 2024-04-24 NOTE — Assessment & Plan Note (Addendum)
 Likely secondary to septic shock, multifocal pneumonia.  Patient confused and lethargic.  Head CT negative for acute abnormality. -Management per above - Hold psychoactive medications, Effexor , Trintellix , trazodone , Lyrica , Xanax 

## 2024-04-24 NOTE — ED Triage Notes (Signed)
 Pt BIB [facility] From [facility] for c/o increase ams that started today  Pt recently treated for UTI  94% on RA and 4L of O2 given by ems with sats increased to 94%  BP 96/31 P 87 CBG 148  Pt has no complaints

## 2024-04-24 NOTE — Assessment & Plan Note (Signed)
>>  ASSESSMENT AND PLAN FOR DIABETES (HCC) WRITTEN ON 04/24/2024  7:51 PM BY EMOKPAE, EJIROGHENE E, MD  - HgbA1c - SSI- s - Metformin  discontinued after last hospitalization last month.

## 2024-04-24 NOTE — ED Notes (Signed)
 Pt unable to swallow pills. EDP notified.

## 2024-04-24 NOTE — Assessment & Plan Note (Signed)
-   HgbA1c - SSI- s - Metformin  discontinued after last hospitalization last month.

## 2024-04-24 NOTE — Assessment & Plan Note (Addendum)
 Septic shock secondary to multifocal pneumonia.  Meeting severe sepsis criteria with fever of 101.1, tachypnea, respiratory rate 16-25, hypotension, blood pressure down to 77/48, now improved on pressors.  Lactic acid 1.6 > 2.  Days of endorgan dysfunction -acute respiratory failure, altered mental status. UA not suggestive of UTI. -Requiring low-dose pressors- peripheral Levophed , continue - Will give another 500 mL bolus, now totaling 2 L bolus -Continue N/s 100cc/hr x 15hrs - Follow-up blood cultures

## 2024-04-24 NOTE — H&P (Addendum)
 History and Physical    ZARAIAH BURNARD OZH:[id number] DOB: Oct 14, 1941 DOA: 04/24/2024  PCP: Omie Bickers, MD   Patient coming from: [facility]  I have personally briefly reviewed patient's old medical records in [facility]  Chief Complaint: Fever, Cough  HPI: [name] White is a 83 y.o. female with medical history significant for diabetes, hypertension, stroke, pituitary adenoma, PSVT, anxiety and depression. Patient was brought to the ED from her group facility with reports of confusion that started today, also with cough, fever.   At the time of my evaluation, patient is lethargic, responds to direction but unable to answer questions or give history.  Patient's son [name]) is at bedside.   Recent hospitalization 3/17 to 3/25 for acute/early subacute stroke with concomitant acute metabolic encephalopathy.  Was discharged to SNF.  ED Course: Temperature 101.1.  Heart rate 57-89.  Respiratory rate 16 -  25.  Blood pressure down to 77/48.  O2 sats down to 80%, placed on 2 L currently > 93%.  Lactic acid 1.6 > 2.  WBC 8.1.  COVID influenza RSV negative.  UA not suggestive of infection. CT chest without contrast-heterogeneous solid and groundglass opacities throughout left lung and patchy opacities in the right lower lobe compatible with multifocal pneumonia. 1.5 L bolus given. Patient initially started on IV ceftriaxone  and azithromycin.  With worsening blood pressure and patient requiring pressors, patient was switched to cefepime and vancomycin .  Review of Systems: As per HPI all other systems reviewed and negative.  Past Medical History:  Diagnosis Date   Anxiety    Asthma    ASYMPTOMATIC POSTMENOPAUSAL STATUS 02/09/2009   Cataract    Constipation 03/19/2013   Depression    DIABETES MELLITUS, TYPE II 09/14/2007   HYPERCHOLESTEROLEMIA 02/09/2009   HYPERTENSION 02/09/2009   HYPOTHYROIDISM 09/14/2007   MIGRAINE HEADACHE 09/14/2007   Neuropathy    OSTEOPOROSIS 02/09/2009    PANCREATITIS 09/14/2007   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   PONV (postoperative nausea and vomiting)    Rectocele 03/19/2013   Shortness of breath    SUPERFICIAL PHLEBITIS 09/14/2007   Varicose veins     Past Surgical History:  Procedure Laterality Date   ABDOMINAL HYSTERECTOMY     ANTERIOR AND POSTERIOR REPAIR N/A 04/02/2013   Procedure: ANTERIOR (CYSTOCELE) AND POSTERIOR REPAIR (RECTOCELE);  Surgeon: Albino Hum, MD;  Location: [facility];  Service: Gynecology;  Laterality: N/A;   APPENDECTOMY     BRAIN SURGERY     CATARACT EXTRACTION W/PHACO  12/05/2011   Procedure: CATARACT EXTRACTION PHACO AND INTRAOCULAR LENS PLACEMENT (IOC);  Surgeon: Anner Kill;  Location: [facility];  Service: Ophthalmology;  Laterality: Left;  CDE:9.65   CHOLECYSTECTOMY     CRANIOTOMY N/A 06/04/2020   Procedure: Endoscopic Transphenoidal Resection of Recurrent PituitaryTumor;  Surgeon: Elna Haggis, MD;  Location: [facility] OR;  Service: Neurosurgery;  Laterality: N/A;   CYSTOSCOPY     ENDOVENOUS ABLATION SAPHENOUS VEIN W/ LASER  11-03-2011   right greater saphenous vein   left leg done 10-2011   EYE SURGERY  98   right cataract extraction 98   LAPAROSCOPIC NISSEN FUNDOPLICATION     NM ESOPHAGEAL REFLUX  08-11-11   PITUITARY EXCISION  10/1997   POSTERIOR REPAIR     TRANSNASAL APPROACH N/A 06/04/2020   Procedure: TRANSNASAL APPROACH;  Surgeon: Ammon Bales, MD;  Location: [facility] OR;  Service: ENT;  Laterality: N/A;   TRANSPHENOIDAL / TRANSNASAL HYPOPHYSECTOMY / RESECTION PITUITARY TUMOR  08-11-11     reports  that she has never smoked. She has never used smokeless tobacco. She reports that she does not drink alcohol and does not use drugs.  Allergies  Allergen Reactions   Betadine [Povidone Iodine] Hives   Penicillins Rash    Family History  Problem Relation Age of Onset   Heart disease Mother    Asthma Mother    COPD Father    Heart disease Father    Stroke Father    Asthma Daughter    Migraines Daughter     Neuropathy Son    Cancer Neg Hx    Anesthesia problems Neg Hx    Hypotension Neg Hx    Malignant hyperthermia Neg Hx    Pseudochol deficiency Neg Hx     Prior to Admission medications   Medication Sig Start Date End Date Taking? Authorizing Provider  nitrofurantoin, macrocrystal-monohydrate, (MACROBID) 100 MG capsule Take 100 mg by mouth 2 (two) times daily. 04/19/24  Yes [provider]  oxybutynin (DITROPAN-XL) 5 MG 24 hr tablet Take 5 mg by mouth every morning. 01/13/24  Yes [provider]  zinc gluconate 50 MG tablet Take 50 mg by mouth daily. 01/13/24  Yes [provider]  acetaminophen  (TYLENOL ) 325 MG tablet Take 2 tablets (650 mg total) by mouth every 6 (six) hours as needed for mild pain (pain score 1-3) (or Fever >/= 101). 01/01/24   Bobbetta Burnet, MD  albuterol  (VENTOLIN  HFA) 108 (90 Base) MCG/ACT inhaler Inhale 2 puffs into the lungs every 4 (four) hours as needed for wheezing or shortness of breath. 03/19/24   Quintella Buck, Courage, MD  ALPRAZolam  (XANAX ) 0.25 MG tablet Take 1 tablet (0.25 mg total) by mouth daily as needed for anxiety. 03/19/24   Colin Dawley, MD  atorvastatin  (LIPITOR) 40 MG tablet Take 1 tablet (40 mg total) by mouth daily. 03/20/24   Colin Dawley, MD  baclofen  (LIORESAL ) 10 MG tablet Take 10 mg by mouth at bedtime. 02/14/24   [provider]  carvedilol  (COREG ) 3.125 MG tablet TAKE ONE TABLET BY MOUTH TWICE A DAY 09/25/23   Laurann Pollock, MD  clopidogrel  (PLAVIX ) 75 MG tablet Take 1 tablet (75 mg total) by mouth daily. take Aspirin  81 mg daily along with Plavix  75 mg daily for 21 days then after that STOP the Aspirin   and continue ONLY Plavix  75 mg daily indefinitely--for secondary stroke Prevention (Per The multicenter SAMMPRIS trial) 03/20/24   Colin Dawley, MD  cycloSPORINE  (RESTASIS ) 0.05 % ophthalmic emulsion Place 1 drop into both eyes 2 (two) times daily.    [provider]  estradiol  (ESTRACE ) 0.1 MG/GM  vaginal cream Place 1 Applicatorful vaginally 2 (two) times a week. 02/28/24   [provider]  feeding supplement (ENSURE ENLIVE / ENSURE PLUS) LIQD Take 237 mLs by mouth 2 (two) times daily between meals. 03/19/24   Colin Dawley, MD  fluticasone  furoate-vilanterol (BREO ELLIPTA ) 200-25 MCG/ACT AEPB Inhale 1 puff into the lungs daily. 03/19/24   Colin Dawley, MD  levothyroxine  (SYNTHROID ) 50 MCG tablet Take 50 mcg by mouth daily.    [provider]  metFORMIN  (GLUCOPHAGE ) 500 MG tablet Take 500 mg by mouth 2 (two) times daily. 04/16/24   [provider]  Multiple Vitamins-Minerals (MULTIVITAMINS THER. W/MINERALS) TABS Take 1 tablet by mouth every morning.    [provider]  omeprazole (PRILOSEC) 20 MG capsule Take 20 mg by mouth every morning.    [provider]  pregabalin  (LYRICA ) 150 MG capsule Take 1 capsule (150  mg total) by mouth 2 (two) times daily. 03/19/24   Colin Dawley, MD  rizatriptan  (MAXALT ) 10 MG tablet Take 10 mg by mouth as needed for migraine. 04/16/24   [provider]  senna-docusate (SENOKOT-S) 8.6-50 MG tablet Take 2 tablets by mouth at bedtime. 03/19/24 03/19/25  Colin Dawley, MD  traZODone  (DESYREL ) 50 MG tablet Take 1 tablet (50 mg total) by mouth at bedtime as needed for sleep. 03/19/24   Colin Dawley, MD  TRINTELLIX  10 MG TABS tablet Take 10 mg by mouth daily. 03/09/24   [provider]  venlafaxine  (EFFEXOR -XR) 150 MG 24 hr capsule Take 150 mg by mouth every morning.    [provider]  Vibegron (GEMTESA) 75 MG TABS Take 75 mg by mouth at bedtime.    [provider]    Physical Exam: Vitals:   04/24/24 1738 04/24/24 1745 04/24/24 1800 04/24/24 1815  BP: 133/68 (!) 88/47 113/65 106/70  Pulse: 90 96 86 84  Resp: (!) 24 (!) 25 20 19   Temp:      TempSrc:      SpO2: 92% 96% 95% 94%  Weight:      Height:        Constitutional: Acutely ill-appearing Vitals:   04/24/24 1738  04/24/24 1745 04/24/24 1800 04/24/24 1815  BP: 133/68 (!) 88/47 113/65 106/70  Pulse: 90 96 86 84  Resp: (!) 24 (!) 25 20 19   Temp:      TempSrc:      SpO2: 92% 96% 95% 94%  Weight:      Height:       Eyes: Pupils equal, lids and conjunctivae normal ENMT: Mucous membranes are markedly dry  Neck: normal, supple, no masses, no thyromegaly Respiratory: Congested breath sounds, normal work of breathing, no accessory muscle use. Cardiovascular: Regular rate and rhythm, no murmurs / rubs / gallops. No extremity edema.  Extremities warm. Abdomen: no tenderness, no masses palpated. No hepatosplenomegaly. Musculoskeletal: no clubbing / cyanosis. No joint deformity upper and lower extremities.  Skin: no rashes, lesions, ulcers. No induration Neurologic: No facial asymmetry, equal grip strength bilaterally, able to move both lower extremities on stimulation.  Psychiatric: Lethargic, following some directions  Labs on Admission: I have personally reviewed following labs and imaging studies  CBC: Recent Labs  Lab 04/24/24 1113  WBC 8.1  NEUTROABS 6.1  HGB 13.7  HCT 41.2  MCV 87.8  PLT 260   Basic Metabolic Panel: Recent Labs  Lab 04/24/24 1113  NA 128*  K 3.9  CL 93*  CO2 24  GLUCOSE 119*  BUN 12  CREATININE 0.63  CALCIUM  8.7*   GFR: Estimated Creatinine Clearance: 48.8 mL/min (by C-G formula based on SCr of 0.63 mg/dL). Liver Function Tests: Recent Labs  Lab 04/24/24 1113  AST 26  ALT 20  ALKPHOS 57  BILITOT 0.9  PROT 7.1  ALBUMIN 3.4*   Urine analysis:    Component Value Date/Time   COLORURINE YELLOW 04/24/2024 1123   APPEARANCEUR CLEAR 04/24/2024 1123   LABSPEC 1.014 04/24/2024 1123   PHURINE 5.0 04/24/2024 1123   GLUCOSEU NEGATIVE 04/24/2024 1123   GLUCOSEU >=1000 (A) 06/25/2020 1441   HGBUR NEGATIVE 04/24/2024 1123   BILIRUBINUR NEGATIVE 04/24/2024 1123   BILIRUBINUR neg 03/19/2013 1512   KETONESUR 5 (A) 04/24/2024 1123   PROTEINUR NEGATIVE 04/24/2024  1123   UROBILINOGEN 0.2 06/25/2020 1441   NITRITE NEGATIVE 04/24/2024 1123   LEUKOCYTESUR NEGATIVE 04/24/2024 1123    Radiological Exams on Admission: CT Head  Wo Contrast Result Date: 04/24/2024 CLINICAL DATA:  ams EXAM: CT HEAD WITHOUT CONTRAST TECHNIQUE: Contiguous axial images were obtained from the base of the skull through the vertex without intravenous contrast. RADIATION DOSE REDUCTION: This exam was performed according to the departmental dose-optimization program which includes automated exposure control, adjustment of the mA and/or kV according to patient size and/or use of iterative reconstruction technique. COMPARISON:  CT scan head from 03/14/2024. FINDINGS: Brain: No evidence of acute infarction, hemorrhage, hydrocephalus, extra-axial collection or mass lesion/mass effect. There is bilateral periventricular hypodensity, which is non-specific but most likely seen in the settings of microvascular ischemic changes. Moderate in extent. 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 Otherwise normal appearance of brain parenchyma. Redemonstration of patient's known sellar/suprasellar mass. No significant interval change. Ventricles are normal. Cerebral volume is age appropriate. Vascular: No hyperdense vessel or unexpected calcification. Intracranial arteriosclerosis. Skull: Normal. Negative for fracture or focal lesion. Sinuses/Orbits: No acute finding. Other: Visualized mastoid air cells are unremarkable. No mastoid effusion. IMPRESSION: *No acute intracranial abnormality. No significant interval change. Electronically Signed   By: Beula Brunswick M.D.   On: 04/24/2024 14:00   CT Chest Wo Contrast Result Date: 04/24/2024 CLINICAL DATA:  cf pna.  Increased altered mental status. EXAM: CT CHEST WITHOUT CONTRAST TECHNIQUE: Multidetector CT imaging of the chest was performed following the standard protocol without IV contrast. RADIATION DOSE REDUCTION: This exam was performed according to the  departmental dose-optimization program which includes automated exposure control, adjustment of the mA and/or kV according to patient size and/or use of iterative reconstruction technique. COMPARISON:  CT scan chest from 12/25/2023. FINDINGS: Cardiovascular: Normal cardiac size. No pericardial effusion. No aortic aneurysm. There are coronary artery calcifications, in keeping with coronary artery disease. There are also mild peripheral atherosclerotic vascular calcifications of thoracic aorta and its major branches. Mediastinum/Nodes: Visualized thyroid  gland appears grossly unremarkable. No solid / cystic mediastinal masses. The esophagus is nondistended precluding optimal assessment. There are few enlarged mediastinal lymph nodes, which appear grossly similar to the prior study and favored benign/reactive in the given clinical setting. No axillary lymphadenopathy by size criteria. Evaluation of bilateral hila is limited due to lack on intravenous contrast: however, no large hilar lymphadenopathy identified. Lungs/Pleura: The central tracheo-bronchial tree is patent. There are heterogeneous solid and ground-glass opacities throughout left lung and patchy opacities in the right lung lower lobe, compatible with multilobar pneumonia. Follow-up to clearing is recommended. There is no associated lung mass, pleural effusion or pneumothorax. No suspicious lung nodule. Upper Abdomen: Surgically absent gallbladder. Stable left adrenal adenomas. Remaining visualized upper abdominal viscera within normal limits. Musculoskeletal: The visualized soft tissues of the chest wall are grossly unremarkable. No suspicious osseous lesions. There are mild to moderate multilevel degenerative changes in the visualized spine. IMPRESSION: 1. There are heterogeneous, solid and ground-glass opacities throughout left lung and patchy opacities in the right lung lower lobe, compatible with multilobar pneumonia. Follow-up to clearing is  recommended. 2. Multiple other nonacute observations, as described above. Aortic Atherosclerosis (ICD10-I70.0). Electronically Signed   By: Beula Brunswick M.D.   On: 04/24/2024 13:56   DG Chest Portable 1 View Result Date: 04/24/2024 CLINICAL DATA:  Fever and cough. EXAM: PORTABLE CHEST 1 VIEW COMPARISON:  Chest radiograph dated 03/11/2024. FINDINGS: Diffuse interstitial nodular density throughout the left lung concerning for pneumonia, possibly atypical in etiology. Aspiration is not excluded. No consolidative changes. There is no pleural effusion pneumothorax. The cardiac silhouette is within limits. Atherosclerotic calcification of the aorta. No acute osseous  pathology. IMPRESSION: Diffuse interstitial nodular density throughout the left lung concerning for pneumonia. Electronically Signed   By: Angus Bark M.D.   On: 04/24/2024 11:40    EKG: Independently reviewed.  Sinus rhythm, rate 83, QTc 458.  No significant change from prior.  Assessment/Plan Principal Problem:   Septic shock (HCC) Active Problems:   Acute metabolic encephalopathy   Multifocal pneumonia   Acute respiratory failure with hypoxia (HCC)   CVA (cerebral vascular accident) (HCC)   Hypothyroidism   Diabetes (HCC)   Essential hypertension   Pressure injury of skin   Anxiety and depression    Assessment and Plan: * Septic shock (HCC) Septic shock secondary to multifocal pneumonia.  Meeting severe sepsis criteria with fever of 101.1, tachypnea, respiratory rate 16-25, hypotension, blood pressure down to 77/48, now improved on pressors.  Lactic acid 1.6 > 2.  Days of endorgan dysfunction -acute respiratory failure, altered mental status. UA not suggestive of UTI. -Requiring low-dose pressors- peripheral Levophed , continue - Will give another 500 mL bolus, now totaling 2 L bolus -Continue N/s 100cc/hr x 15hrs - Follow-up blood cultures  Acute respiratory failure with hypoxia (HCC) O2 sats down to 80%, currently on  2 L.  Sats greater than 93%.  ABG showed pH of 7.4, pCO2 of 47.  Likely secondary to multifocal pneumonia.  Multifocal pneumonia Multifocal pneumonia with septic shock and acute hypoxic respiratory failure. CT chest shows multifocal pneumonia.  COVID influenza RSV negative.  MRSA negative. -Continue IV ceftriaxone  and azithromycin -Follow-up blood cultures - Check urine Legionella -Urine strep - Bronchodilators, mucolytics  Acute metabolic encephalopathy Likely secondary to septic shock, multifocal pneumonia.  Patient confused and lethargic.  Head CT negative for acute abnormality. -Management per above - Hold psychoactive medications, Effexor , Trintellix , trazodone , Lyrica , Xanax   CVA (cerebral vascular accident) (HCC) Resume Plavix   Anxiety and depression Med reconciliation pending, for now hold all psychoactive meds with altered mental status.  Essential hypertension Currently hypotensive requiring pressors - Hold Carvedilol   Diabetes (HCC) - HgbA1c - SSI- s - Metformin  discontinued after last hospitalization last month.  Hypothyroidism Resume Synthroid  in a.m.    DVT prophylaxis: Lovenox  Code Status: DNR-confirmed with patient's son [name] at bedside. Family Communication: Patient's Son [name] at bedside.  Patient's daughter Edwina Gram is HCPOA. Disposition Plan: > 2 days Consults called: None Admission status: Inpt ICU I certify that at the point of admission it is my clinical judgment that the patient will require inpatient hospital care spanning beyond 2 midnights from the point of admission due to high intensity of service, high risk for further deterioration and high frequency of surveillance required.   CRITICAL CARE Performed by: Pati Bonine   Total critical care time: 70 minutes  Critical care time was exclusive of separately billable procedures and treating other patients.  Critical care was necessary to treat or prevent imminent or  life-threatening deterioration.  Critical care was time spent personally by me on the following activities: development of treatment plan with patient and/or surrogate as well as nursing, discussions with consultants, evaluation of patient's response to treatment, examination of patient, obtaining history from patient or surrogate, ordering and performing treatments and interventions, ordering and review of laboratory studies, ordering and review of radiographic studies, pulse oximetry and re-evaluation of patient's condition.   Author: Pati Bonine, MD 04/24/2024 7:58 PM  For on call review [email or web address].

## 2024-04-24 NOTE — Assessment & Plan Note (Signed)
 Resume Plavix

## 2024-04-24 NOTE — ED Provider Notes (Signed)
 [place] EMERGENCY DEPARTMENT AT [facility] Provider Note   CSN: [id number] Arrival date & time: 04/24/24  1034     History  Chief Complaint  Patient presents with   Altered Mental Status    [name] White is a 83 y.o. female.  83 year old female with a history of stroke on aspirin  and Plavix , diabetes, hypothyroidism, UTI, and asthma who presents to the emergency department with cough, fever, and confusion.  History obtained per the patient's daughter.  Reports that she was at her facility ([facility] [place]) and over the past few days has developed the above symptoms.  Says that she is also eating less than usual.  Seems less active than usual.  Was brought into the emergency department for further evaluation.  Is currently being treated for UTI.  Also has had significant generalized weakness recently.       Home Medications Prior to Admission medications   Medication Sig Start Date End Date Taking? Authorizing Provider  nitrofurantoin, macrocrystal-monohydrate, (MACROBID) 100 MG capsule Take 100 mg by mouth 2 (two) times daily. 04/19/24  Yes [provider]  oxybutynin (DITROPAN-XL) 5 MG 24 hr tablet Take 5 mg by mouth every morning. 01/13/24  Yes [provider]  zinc gluconate 50 MG tablet Take 50 mg by mouth daily. 01/13/24  Yes [provider]  acetaminophen  (TYLENOL ) 325 MG tablet Take 2 tablets (650 mg total) by mouth every 6 (six) hours as needed for mild pain (pain score 1-3) (or Fever >/= 101). 01/01/24   Bobbetta Burnet, MD  albuterol  (VENTOLIN  HFA) 108 (90 Base) MCG/ACT inhaler Inhale 2 puffs into the lungs every 4 (four) hours as needed for wheezing or shortness of breath. 03/19/24   Quintella Buck, Courage, MD  ALPRAZolam  (XANAX ) 0.25 MG tablet Take 1 tablet (0.25 mg total) by mouth daily as needed for anxiety. 03/19/24   Colin Dawley, MD  atorvastatin  (LIPITOR) 40 MG tablet Take 1 tablet (40 mg total) by mouth daily. 03/20/24   Colin Dawley, MD  baclofen  (LIORESAL ) 10 MG tablet Take 10 mg by mouth at bedtime. 02/14/24   [provider]  carvedilol  (COREG ) 3.125 MG tablet TAKE ONE TABLET BY MOUTH TWICE A DAY 09/25/23   Laurann Pollock, MD  clopidogrel  (PLAVIX ) 75 MG tablet Take 1 tablet (75 mg total) by mouth daily. take Aspirin  81 mg daily along with Plavix  75 mg daily for 21 days then after that STOP the Aspirin   and continue ONLY Plavix  75 mg daily indefinitely--for secondary stroke Prevention (Per The multicenter SAMMPRIS trial) 03/20/24   Colin Dawley, MD  cycloSPORINE  (RESTASIS ) 0.05 % ophthalmic emulsion Place 1 drop into both eyes 2 (two) times daily.    [provider]  estradiol  (ESTRACE ) 0.1 MG/GM vaginal cream Place 1 Applicatorful vaginally 2 (two) times a week. 02/28/24   [provider]  feeding supplement (ENSURE ENLIVE / ENSURE PLUS) LIQD Take 237 mLs by mouth 2 (two) times daily between meals. 03/19/24   Colin Dawley, MD  fluticasone  furoate-vilanterol (BREO ELLIPTA ) 200-25 MCG/ACT AEPB Inhale 1 puff into the lungs daily. 03/19/24   Colin Dawley, MD  levothyroxine  (SYNTHROID ) 50 MCG tablet Take 50 mcg by mouth daily.    [provider]  metFORMIN  (GLUCOPHAGE ) 500 MG tablet Take 500 mg by mouth 2 (two) times daily. 04/16/24   [provider]  Multiple Vitamins-Minerals (MULTIVITAMINS THER. W/MINERALS) TABS Take 1 tablet by mouth every morning.    [provider]  omeprazole (PRILOSEC) 20  MG capsule Take 20 mg by mouth every morning.    [provider]  pregabalin  (LYRICA ) 150 MG capsule Take 1 capsule (150 mg total) by mouth 2 (two) times daily. 03/19/24   Colin Dawley, MD  rizatriptan  (MAXALT ) 10 MG tablet Take 10 mg by mouth as needed for migraine. 04/16/24   [provider]  senna-docusate (SENOKOT-S) 8.6-50 MG tablet Take 2 tablets by mouth at bedtime. 03/19/24 03/19/25  Colin Dawley, MD  traZODone  (DESYREL ) 50 MG tablet Take 1  tablet (50 mg total) by mouth at bedtime as needed for sleep. 03/19/24   Colin Dawley, MD  TRINTELLIX  10 MG TABS tablet Take 10 mg by mouth daily. 03/09/24   [provider]  venlafaxine  (EFFEXOR -XR) 150 MG 24 hr capsule Take 150 mg by mouth every morning.    [provider]  Vibegron (GEMTESA) 75 MG TABS Take 75 mg by mouth at bedtime.    [provider]      Allergies    Betadine [povidone iodine] and Penicillins    Review of Systems   Review of Systems  Physical Exam Updated Vital Signs BP 106/70   Pulse 84   Temp (!) 101.1 F (38.4 C) (Rectal)   Resp 19   Ht 5\' 5"  (1.651 m)   Wt 59.6 kg   SpO2 94%   BMI 21.87 kg/m  Physical Exam Vitals and nursing note reviewed.  Constitutional:      General: She is not in acute distress.    Appearance: She is well-developed.     Comments: Drowsy but easily arousable.  HENT:     Head: Normocephalic and atraumatic.     Right Ear: External ear normal.     Left Ear: External ear normal.     Nose: Nose normal.  Eyes:     Extraocular Movements: Extraocular movements intact.     Conjunctiva/sclera: Conjunctivae normal.     Pupils: Pupils are equal, round, and reactive to light.  Cardiovascular:     Rate and Rhythm: Normal rate and regular rhythm.     Heart sounds: No murmur heard. Pulmonary:     Effort: Pulmonary effort is normal. No respiratory distress.     Breath sounds: Wheezing and rhonchi (Left hemifield) present.     Comments: On 2 L nasal cannula. Musculoskeletal:     Cervical back: Normal range of motion and neck supple.     Right lower leg: No edema.     Left lower leg: No edema.  Skin:    General: Skin is warm and dry.  Neurological:     Mental Status: Mental status is at baseline.     Comments: Alert to self.  Cranial nerves II through XII appear grossly intact.  Full strength of bilateral upper extremities.  Full strength of left lower extremity.  Some drift to right lower extremity.   Intact sensation light touch in all 4 extremities.  Psychiatric:        Mood and Affect: Mood normal.     ED Results / Procedures / Treatments   Labs (all labs ordered are listed, but only abnormal results are displayed) Labs Reviewed  COMPREHENSIVE METABOLIC PANEL WITH GFR - Abnormal; Notable for the following components:      Result Value   Sodium 128 (*)    Chloride 93 (*)    Glucose, Bld 119 (*)    Calcium  8.7 (*)    Albumin 3.4 (*)    All other components within normal limits  LACTIC ACID, PLASMA - Abnormal; Notable for the following components:   Lactic Acid, Venous 2.0 (*)    All other components within normal limits  URINALYSIS, W/ REFLEX TO CULTURE (INFECTION SUSPECTED) - Abnormal; Notable for the following components:   Ketones, ur 5 (*)    All other components within normal limits  BLOOD GAS, VENOUS - Abnormal; Notable for the following components:   Bicarbonate 28.5 (*)    Acid-Base Excess 3.4 (*)    All other components within normal limits  RESP PANEL BY RT-PCR (RSV, FLU A&B, COVID)  [id number]  CULTURE, BLOOD (ROUTINE X 2)  CULTURE, BLOOD (ROUTINE X 2) W REFLEX TO ID PANEL  MRSA NEXT GEN BY PCR, NASAL  LACTIC ACID, PLASMA  CBC WITH DIFFERENTIAL/PLATELET  BASIC METABOLIC PANEL WITH GFR  CBC    EKG EKG Interpretation Date/Time:  Wednesday April 24 2024 10:45:45 EDT Ventricular Rate:  83 PR Interval:  150 QRS Duration:  101 QT Interval:  389 QTC Calculation: 458 R Axis:   72  Text Interpretation: Sinus rhythm Ventricular premature complex Confirmed by Shyrl Doyne [phone]) on 04/24/2024 1:37:48 PM  Radiology CT Head Wo Contrast Result Date: 04/24/2024 CLINICAL DATA:  ams EXAM: CT HEAD WITHOUT CONTRAST TECHNIQUE: Contiguous axial images were obtained from the base of the skull through the vertex without intravenous contrast. RADIATION DOSE REDUCTION: This exam was performed according to the departmental dose-optimization program which includes automated  exposure control, adjustment of the mA and/or kV according to patient size and/or use of iterative reconstruction technique. COMPARISON:  CT scan head from 03/14/2024. FINDINGS: Brain: No evidence of acute infarction, hemorrhage, hydrocephalus, extra-axial collection or mass lesion/mass effect. There is bilateral periventricular hypodensity, which is non-specific but most likely seen in the settings of microvascular ischemic changes. Moderate in extent. 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 Otherwise normal appearance of brain parenchyma. Redemonstration of patient's known sellar/suprasellar mass. No significant interval change. Ventricles are normal. Cerebral volume is age appropriate. Vascular: No hyperdense vessel or unexpected calcification. Intracranial arteriosclerosis. Skull: Normal. Negative for fracture or focal lesion. Sinuses/Orbits: No acute finding. Other: Visualized mastoid air cells are unremarkable. No mastoid effusion. IMPRESSION: *No acute intracranial abnormality. No significant interval change. Electronically Signed   By: Beula Brunswick M.D.   On: 04/24/2024 14:00   CT Chest Wo Contrast Result Date: 04/24/2024 CLINICAL DATA:  cf pna.  Increased altered mental status. EXAM: CT CHEST WITHOUT CONTRAST TECHNIQUE: Multidetector CT imaging of the chest was performed following the standard protocol without IV contrast. RADIATION DOSE REDUCTION: This exam was performed according to the departmental dose-optimization program which includes automated exposure control, adjustment of the mA and/or kV according to patient size and/or use of iterative reconstruction technique. COMPARISON:  CT scan chest from 12/25/2023. FINDINGS: Cardiovascular: Normal cardiac size. No pericardial effusion. No aortic aneurysm. There are coronary artery calcifications, in keeping with coronary artery disease. There are also mild peripheral atherosclerotic vascular calcifications of thoracic aorta and its  major branches. Mediastinum/Nodes: Visualized thyroid  gland appears grossly unremarkable. No solid / cystic mediastinal masses. The esophagus is nondistended precluding optimal assessment. There are few enlarged mediastinal lymph nodes, which appear grossly similar to the prior study and favored benign/reactive in the given clinical setting. No axillary lymphadenopathy by size criteria. Evaluation of bilateral hila is limited due to lack on intravenous contrast: however, no large hilar lymphadenopathy identified. Lungs/Pleura: The central tracheo-bronchial tree is patent. There are heterogeneous solid and ground-glass opacities throughout left lung and patchy opacities in  the right lung lower lobe, compatible with multilobar pneumonia. Follow-up to clearing is recommended. There is no associated lung mass, pleural effusion or pneumothorax. No suspicious lung nodule. Upper Abdomen: Surgically absent gallbladder. Stable left adrenal adenomas. Remaining visualized upper abdominal viscera within normal limits. Musculoskeletal: The visualized soft tissues of the chest wall are grossly unremarkable. No suspicious osseous lesions. There are mild to moderate multilevel degenerative changes in the visualized spine. IMPRESSION: 1. There are heterogeneous, solid and ground-glass opacities throughout left lung and patchy opacities in the right lung lower lobe, compatible with multilobar pneumonia. Follow-up to clearing is recommended. 2. Multiple other nonacute observations, as described above. Aortic Atherosclerosis (ICD10-I70.0). Electronically Signed   By: Beula Brunswick M.D.   On: 04/24/2024 13:56   DG Chest Portable 1 View Result Date: 04/24/2024 CLINICAL DATA:  Fever and cough. EXAM: PORTABLE CHEST 1 VIEW COMPARISON:  Chest radiograph dated 03/11/2024. FINDINGS: Diffuse interstitial nodular density throughout the left lung concerning for pneumonia, possibly atypical in etiology. Aspiration is not excluded. No  consolidative changes. There is no pleural effusion pneumothorax. The cardiac silhouette is within limits. Atherosclerotic calcification of the aorta. No acute osseous pathology. IMPRESSION: Diffuse interstitial nodular density throughout the left lung concerning for pneumonia. Electronically Signed   By: Angus Bark M.D.   On: 04/24/2024 11:40    Procedures Procedures    Medications Ordered in ED Medications  0.9 %  sodium chloride  infusion (250 mLs Intravenous New Bag/Given 04/24/24 1718)  norepinephrine  (LEVOPHED ) 4mg  in (0.016 mg/mL) premix infusion (2 mcg/min Intravenous Rate/Dose Change 04/24/24 1730)  vancomycin  (VANCOREADY) IVPB 1250 mg/250 mL (1,250 mg Intravenous New Bag/Given 04/24/24 1721)  vancomycin  (VANCOCIN ) IVPB 1000 mg/200 mL premix (has no administration in time range)  albuterol  (PROVENTIL ) (2.5 MG/3ML) 0.083% nebulizer solution 2.5 mg (has no administration in time range)  Chlorhexidine  Gluconate Cloth 2 % PADS 6 each (6 each Topical Given 04/24/24 1717)  insulin  aspart (novoLOG ) injection 0-9 Units (has no administration in time range)  insulin  aspart (novoLOG ) injection 0-5 Units (has no administration in time range)  enoxaparin  (LOVENOX ) injection 40 mg (has no administration in time range)  acetaminophen  (TYLENOL ) tablet 650 mg (has no administration in time range)    Or  acetaminophen  (TYLENOL ) suppository 650 mg (has no administration in time range)  ondansetron  (ZOFRAN ) tablet 4 mg (has no administration in time range)    Or  ondansetron  (ZOFRAN ) injection 4 mg (has no administration in time range)  polyethylene glycol (MIRALAX  / GLYCOLAX ) packet 17 g (has no administration in time range)  0.9 %  sodium chloride  infusion ( Intravenous New Bag/Given 04/24/24 1746)  cefTRIAXone  (ROCEPHIN ) 2 g in sodium chloride  0.9 % 100 mL IVPB (0 g Intravenous Stopped 04/24/24 1225)  azithromycin (ZITHROMAX) 500 mg in sodium chloride  0.9 % 250 mL IVPB (0 mg Intravenous  Stopped 04/24/24 1407)  ipratropium-albuterol  (DUONEB) 0.5-2.5 (3) MG/3ML nebulizer solution 3 mL (3 mLs Nebulization Given 04/24/24 1140)  lactated ringers  bolus 1,000 mL (0 mLs Intravenous Stopped 04/24/24 1434)  acetaminophen  (TYLENOL ) suppository 650 mg (650 mg Rectal Given 04/24/24 1301)  lactated ringers  bolus 1,000 mL (0 mLs Intravenous Stopped 04/24/24 1439)  ipratropium-albuterol  (DUONEB) 0.5-2.5 (3) MG/3ML nebulizer solution 3 mL (3 mLs Nebulization Given 04/24/24 1434)  methylPREDNISolone  sodium succinate (SOLU-MEDROL ) 125 mg/2 mL injection 125 mg (125 mg Intravenous Given 04/24/24 1446)  sodium chloride  0.9 % bolus 500 mL (0 mLs Intravenous Stopped 04/24/24 1729)  ceFEPIme (MAXIPIME) 2 g in sodium chloride  0.9 % 100  mL IVPB (0 g Intravenous Stopped 04/24/24 1729)    ED Course/ Medical Decision Making/ A&P Clinical Course as of 04/24/24 1831  Wed Apr 24, 2024  1121 DNR/DNI confirmed with patient's daughter [RP]  1228 Sodium(!): 128 132 one month ago [RP]  1638 Dr Quintella Buck from hospitalist to evaluate for admission.  [RP]    Clinical Course User Index [RP] Ninetta Basket, MD                                 Medical Decision Making Amount and/or Complexity of Data Reviewed Labs: ordered. Decision-making details documented in ED Course. Radiology: ordered.  Risk OTC drugs. Prescription drug management. Decision regarding hospitalization.   MARJORI MERSINGER is a 83 y.o. female with comorbidities that complicate the patient evaluation including stroke on aspirin  and Plavix , diabetes, hypothyroidism, UTI, and asthma who presents to the emergency department with cough, fever, and confusion.    Initial Ddx:  Pneumonia, sepsis, delirium, URI, asthma/COPD exacerbation, hypercapnic respiratory failure  MDM/Course:  Patient presents emergency department with fever, cough, and confusion.  Does not have any focal neurologic deficits does appear somewhat drowsy.  Does have some wheezing  and rhonchi on exam.  Not in respiratory distress.  Given Solu-Medrol  and nebulizer.  Venous blood gas was sent and shows that she is not retaining CO2.  Suspect she is delirious from her infection.  Underwent CT head and CT of the chest which shows that she likely has multifocal pneumonia.  Upon re-evaluation patient's blood pressures started to become soft and she was given 2.5 L of fluids.  Unfortunately still was hypotensive and so was started on low-dose norepinephrine .  Also had vancomycin  and cefepime ordered to broaden her antibiotic coverage.  Her pressure became stabilized on this.  Will admit her to hospitalist at our facility since she is DNR/DNI and likely can be managed by our ICU here.  This patient presents to the ED for concern of complaints listed in HPI, this involves an extensive number of treatment options, and is a complaint that carries with it a high risk of complications and morbidity. Disposition including potential need for admission considered.   Dispo: ICU  Additional history obtained from daughter Records reviewed Outpatient Clinic Notes The following labs were independently interpreted: Chemistry and show  acute on chronic hyponatremia I independently reviewed the following imaging with scope of interpretation limited to determining acute life threatening conditions related to emergency care: CT Head and agree with the radiologist interpretation with the following exceptions: none I personally reviewed and interpreted cardiac monitoring: normal sinus rhythm  I personally reviewed and interpreted the pt's EKG: see above for interpretation  I have reviewed the patients home medications and made adjustments as needed Consults: Hospitalist Social Determinants of health:  Geriatric  Portions of this note were generated with Scientist, clinical (histocompatibility and immunogenetics). Dictation errors may occur despite best attempts at proofreading.    CRITICAL CARE Performed by: Ninetta Basket   Total critical care time: 45 minutes  Critical care time was exclusive of separately billable procedures and treating other patients.  Critical care was necessary to treat or prevent imminent or life-threatening deterioration.  Critical care was time spent personally by me on the following activities: development of treatment plan with patient and/or surrogate as well as nursing, discussions with consultants, evaluation of patient's response to treatment, examination of patient, obtaining history from patient or surrogate, ordering and  performing treatments and interventions, ordering and review of laboratory studies, ordering and review of radiographic studies, pulse oximetry and re-evaluation of patient's condition.   Final Clinical Impression(s) / ED Diagnoses Final diagnoses:  Altered mental status, unspecified altered mental status type  Septic shock (HCC)  Pneumonia due to infectious organism, unspecified laterality, unspecified part of lung  Hyponatremia  Exacerbation of asthma, unspecified asthma severity, unspecified whether persistent    Rx / DC Orders ED Discharge Orders     None         Ninetta Basket, MD 04/24/24 [phone]

## 2024-04-24 NOTE — Assessment & Plan Note (Signed)
 O2 sats down to 80%, currently on 2 L.  Sats greater than 93%.  ABG showed pH of 7.4, pCO2 of 47.  Likely secondary to multifocal pneumonia.

## 2024-04-25 DIAGNOSIS — A419 Sepsis, unspecified organism: Secondary | ICD-10-CM | POA: Diagnosis not present

## 2024-04-25 DIAGNOSIS — R6521 Severe sepsis with septic shock: Secondary | ICD-10-CM | POA: Diagnosis not present

## 2024-04-25 LAB — CBC
HCT: 37 % (ref 36.0–46.0)
Hemoglobin: 11.8 g/dL — ABNORMAL LOW (ref 12.0–15.0)
MCH: 29 pg (ref 26.0–34.0)
MCHC: 31.9 g/dL (ref 30.0–36.0)
MCV: 90.9 fL (ref 80.0–100.0)
Platelets: 211 10*3/uL (ref 150–400)
RBC: 4.07 MIL/uL (ref 3.87–5.11)
RDW: 15.5 % (ref 11.5–15.5)
WBC: 6.1 10*3/uL (ref 4.0–10.5)
nRBC: 0 % (ref 0.0–0.2)

## 2024-04-25 LAB — GLUCOSE, CAPILLARY
Glucose-Capillary: 176 mg/dL — ABNORMAL HIGH (ref 70–99)
Glucose-Capillary: 101 mg/dL — ABNORMAL HIGH (ref 70–99)
Glucose-Capillary: 108 mg/dL — ABNORMAL HIGH (ref 70–99)
Glucose-Capillary: 103 mg/dL — ABNORMAL HIGH (ref 70–99)

## 2024-04-25 LAB — BASIC METABOLIC PANEL WITH GFR
Anion gap: 7 (ref 5–15)
BUN: 12 mg/dL (ref 8–23)
CO2: 23 mmol/L (ref 22–32)
Calcium: 7.6 mg/dL — ABNORMAL LOW (ref 8.9–10.3)
Chloride: 107 mmol/L (ref 98–111)
Creatinine, Ser: 0.52 mg/dL (ref 0.44–1.00)
GFR, Estimated: >60 mL/min (ref >60)
Glucose, Bld: 178 mg/dL — ABNORMAL HIGH (ref 70–99)
Potassium: 3.5 mmol/L (ref 3.5–5.1)
Sodium: 137 mmol/L (ref 135–145)

## 2024-04-25 LAB — STREP PNEUMONIAE URINARY ANTIGEN: Strep Pneumo Urinary Antigen: NEGATIVE

## 2024-04-25 LAB — HEMOGLOBIN A1C
Hgb A1c MFr Bld: 7 % — ABNORMAL HIGH (ref 4.8–5.6)
Mean Plasma Glucose: 154.2 mg/dL

## 2024-04-25 MED ORDER — PREGABALIN 75 MG PO CAPS
150.0000 mg | ORAL_CAPSULE | Freq: Two times a day (BID) | ORAL | Status: DC
  Administered 2024-04-25 – 2024-05-01 (×13): 150 mg via ORAL
  Filled 2024-04-25 (×13): qty 2

## 2024-04-25 MED ORDER — MIRABEGRON ER 25 MG PO TB24
25.0000 mg | ORAL_TABLET | Freq: Every day | ORAL | Status: DC
  Administered 2024-04-25 – 2024-05-01 (×7): 25 mg via ORAL
  Filled 2024-04-25 (×7): qty 1

## 2024-04-25 MED ORDER — VENLAFAXINE HCL ER 75 MG PO CP24
150.0000 mg | ORAL_CAPSULE | ORAL | Status: DC
  Administered 2024-04-25 – 2024-05-01 (×7): 150 mg via ORAL
  Filled 2024-04-25 (×7): qty 2

## 2024-04-25 MED ORDER — CYCLOSPORINE 0.05 % OP EMUL
1.0000 [drp] | Freq: Two times a day (BID) | OPHTHALMIC | Status: DC
  Administered 2024-04-25 – 2024-05-01 (×13): 1 [drp] via OPHTHALMIC
  Filled 2024-04-25 (×13): qty 30

## 2024-04-25 MED ORDER — CLOPIDOGREL BISULFATE 75 MG PO TABS
75.0000 mg | ORAL_TABLET | Freq: Every day | ORAL | Status: DC
  Administered 2024-04-25 – 2024-05-01 (×7): 75 mg via ORAL
  Filled 2024-04-25 (×7): qty 1

## 2024-04-25 MED ORDER — LEVOTHYROXINE SODIUM 100 MCG PO TABS
50.0000 ug | ORAL_TABLET | Freq: Every day | ORAL | Status: DC
  Administered 2024-04-25 – 2024-05-01 (×7): 50 ug via ORAL
  Filled 2024-04-25: qty 1
  Filled 2024-04-25 (×2): qty 2
  Filled 2024-04-25: qty 1
  Filled 2024-04-25 (×2): qty 2
  Filled 2024-04-25: qty 1

## 2024-04-25 MED ORDER — VORTIOXETINE HBR 10 MG PO TABS
10.0000 mg | ORAL_TABLET | Freq: Every day | ORAL | Status: DC
  Administered 2024-04-25 – 2024-05-01 (×7): 10 mg via ORAL
  Filled 2024-04-25 (×8): qty 1

## 2024-04-25 MED ORDER — ATORVASTATIN CALCIUM 40 MG PO TABS
40.0000 mg | ORAL_TABLET | Freq: Every day | ORAL | Status: DC
  Administered 2024-04-25 – 2024-05-01 (×7): 40 mg via ORAL
  Filled 2024-04-25 (×8): qty 1

## 2024-04-25 NOTE — Progress Notes (Signed)
 PROGRESS NOTE    [name]  VZD:[id number] DOB: [birth year] DOA: 04/24/2024 PCP: Omie Bickers, MD   Brief Narrative:    [name] White is a 83 y.o. female with medical history significant for diabetes, hypertension, stroke, pituitary adenoma, PSVT, anxiety and depression. Patient was brought to the ED from her group facility with reports of confusion that started today, also with cough, fever.   At the time of my evaluation, patient is lethargic, responds to direction but unable to answer questions or give history.  Patient's son [name] White) is at bedside.    Recent hospitalization 3/17 to 3/25 for acute/early subacute stroke with concomitant acute metabolic encephalopathy.  Was discharged to SNF.  Patient has been admitted with septic shock secondary to multifocal pneumonia along with associated acute hypoxemic respiratory failure and acute metabolic encephalopathy.  Assessment & Plan:   Principal Problem:   Septic shock (HCC) Active Problems:   Acute metabolic encephalopathy   Multifocal pneumonia   Acute respiratory failure with hypoxia (HCC)   CVA (cerebral vascular accident) (HCC)   Hypothyroidism   Diabetes (HCC)   Essential hypertension   Pressure injury of skin   Anxiety and depression  Assessment and Plan:   Septic shock (HCC)-improving Septic shock secondary to multifocal pneumonia.  Meeting severe sepsis criteria with fever of 101.1, tachypnea, respiratory rate 16-25, hypotension, blood pressure down to 77/48, now improved on pressors.  Lactic acid 1.6 > 2.  Days of endorgan dysfunction -acute respiratory failure, altered mental status. UA not suggestive of UTI. - Norepinephrine  has currently been weaned off, monitor closely - Will give another 500 mL bolus, now totaling 2 L bolus -Continue N/s 100cc/hr x 15hrs - Follow-up blood cultures with no growth noted thus far   Acute respiratory failure with hypoxia (HCC) O2 sats down to 80%, currently on 2 L.  Sats  greater than 93%.  ABG showed pH of 7.4, pCO2 of 47.  Likely secondary to multifocal pneumonia. -Continue to wean oxygen requirements   Multifocal pneumonia Multifocal pneumonia with septic shock and acute hypoxic respiratory failure. CT chest shows multifocal pneumonia.  COVID influenza RSV negative.  MRSA negative. -Continue IV ceftriaxone  and azithromycin  -Follow-up blood cultures - Check urine Legionella -Urine strep - Bronchodilators, mucolytics   Acute metabolic encephalopathy Likely secondary to septic shock, multifocal pneumonia.  Patient confused and lethargic.  Head CT negative for acute abnormality. -Management per above - Resume certain psychoactive medications   CVA (cerebral vascular accident) (HCC) Resume Plavix    Anxiety and depression Resume certain psychoactive medications as noted below   Essential hypertension Currently hypotensive requiring pressors - Hold Carvedilol    Diabetes (HCC) - HgbA1c - SSI- s - Metformin  discontinued after last hospitalization last month.   Hypothyroidism Resume Synthroid  in a.m.    DVT prophylaxis:Lovenox  Code Status: DNR Family Communication: Discussed with daughter on phone 5/1 Disposition Plan:  Status is: Inpatient Remains inpatient appropriate because: Need for IV medications.  Skin Assessment:  I have examined the patient's skin and I agree with the wound assessment as performed by the wound care RN as outlined below:  Pressure Injury 04/24/24 Sacrum Mid Stage 1 -  Intact skin with non-blanchable redness of a localized area usually over a bony prominence. (Active)  04/24/24 1724  Location: Sacrum  Location Orientation: Mid  Staging: Stage 1 -  Intact skin with non-blanchable redness of a localized area usually over a bony prominence.  Wound Description (Comments):   Present on Admission:   Dressing  Type Foam - Lift dressing to assess site every shift 04/25/24 0800    Consultants:  None  Procedures:   None  Antimicrobials:  Anti-infectives (From admission, onward)    Start     Dose/Rate Route Frequency Ordered Stop   04/25/24 1600  vancomycin  (VANCOCIN ) IVPB 1000 mg/200 mL premix  Status:  Discontinued        1,000 mg 200 mL/hr over 60 Minutes Intravenous Every 24 hours 04/24/24 1542 04/24/24 1945   04/25/24 1200  azithromycin  (ZITHROMAX ) 500 mg in sodium chloride  0.9 % 250 mL IVPB        500 mg 250 mL/hr over 60 Minutes Intravenous Every 24 hours 04/24/24 2116     04/25/24 1100  cefTRIAXone  (ROCEPHIN ) 2 g in sodium chloride  0.9 % 100 mL IVPB        2 g 200 mL/hr over 30 Minutes Intravenous Every 24 hours 04/24/24 2116     04/24/24 1600  vancomycin  (VANCOREADY) IVPB 1250 mg/250 mL        1,250 mg 166.7 mL/hr over 90 Minutes Intravenous  Once 04/24/24 1541 04/24/24 1851   04/24/24 1530  ceFEPIme  (MAXIPIME ) 2 g in sodium chloride  0.9 % 100 mL IVPB        2 g 200 mL/hr over 30 Minutes Intravenous  Once 04/24/24 1517 04/24/24 1729   04/24/24 1130  cefTRIAXone  (ROCEPHIN ) 2 g in sodium chloride  0.9 % 100 mL IVPB        2 g 200 mL/hr over 30 Minutes Intravenous Once 04/24/24 1119 04/24/24 1225   04/24/24 1130  azithromycin  (ZITHROMAX ) 500 mg in sodium chloride  0.9 % 250 mL IVPB        500 mg 250 mL/hr over 60 Minutes Intravenous  Once 04/24/24 1119 04/24/24 1407       Subjective: Patient seen and evaluated today with no new acute complaints or concerns. No acute concerns or events noted overnight.  Appears to have some intermittent confusion along with some hallucinations.  Objective: Vitals:   04/25/24 0700 04/25/24 0800 04/25/24 0830 04/25/24 0900  BP: (!) 101/56 (!) 100/59 103/68 109/64  Pulse: 75 74 78 77  Resp: 19 15 16 19   Temp:  97.7 F (36.5 C)    TempSrc:  Axillary    SpO2: 99% 97% 96% 96%  Weight:      Height:        Intake/Output Summary (Last 24 hours) at 04/25/2024 1108 Last data filed at 04/25/2024 0525 Gross per 24 hour  Intake 1057.17 ml  Output 1825 ml   Net -767.83 ml   Filed Weights   04/24/24 1045 04/24/24 1715  Weight: 61.3 kg 59.6 kg    Examination:  General exam: Appears calm and comfortable  Respiratory system: Clear to auscultation. Respiratory effort normal. Cardiovascular system: S1 & S2 heard, RRR.  Gastrointestinal system: Abdomen is soft Central nervous system: Alert and awake Extremities: No edema Skin: No significant lesions noted Psychiatry: Flat affect.    Data Reviewed: I have personally reviewed following labs and imaging studies  CBC: Recent Labs  Lab 04/24/24 1113 04/25/24 0429  WBC 8.1 6.1  NEUTROABS 6.1  --   HGB 13.7 11.8*  HCT 41.2 37.0  MCV 87.8 90.9  PLT 260 211   Basic Metabolic Panel: Recent Labs  Lab 04/24/24 1113 04/25/24 0429  NA 128* 137  K 3.9 3.5  CL 93* 107  CO2 24 23  GLUCOSE 119* 178*  BUN 12 12  CREATININE 0.63 0.52  CALCIUM   8.7* 7.6*   GFR: Estimated Creatinine Clearance: 48.8 mL/min (by C-G formula based on SCr of 0.52 mg/dL). Liver Function Tests: Recent Labs  Lab 04/24/24 1113  AST 26  ALT 20  ALKPHOS 57  BILITOT 0.9  PROT 7.1  ALBUMIN 3.4*   No results for input(s): "LIPASE", "AMYLASE" in the last 168 hours. No results for input(s): "AMMONIA" in the last 168 hours. Coagulation Profile: No results for input(s): "INR", "PROTIME" in the last 168 hours. Cardiac Enzymes: No results for input(s): "CKTOTAL", "CKMB", "CKMBINDEX", "TROPONINI" in the last 168 hours. BNP (last 3 results) No results for input(s): "PROBNP" in the last 8760 hours. HbA1C: No results for input(s): "HGBA1C" in the last 72 hours. CBG: Recent Labs  Lab 04/24/24 2135 04/25/24 0744  GLUCAP 178* 176*   Lipid Profile: No results for input(s): "CHOL", "HDL", "LDLCALC", "TRIG", "CHOLHDL", "LDLDIRECT" in the last 72 hours. Thyroid  Function Tests: No results for input(s): "TSH", "T4TOTAL", "FREET4", "T3FREE", "THYROIDAB" in the last 72 hours. Anemia Panel: No results for input(s):  "VITAMINB12", "FOLATE", "FERRITIN", "TIBC", "IRON", "RETICCTPCT" in the last 72 hours. Sepsis Labs: Recent Labs  Lab 04/24/24 1113 04/24/24 1309  LATICACIDVEN 1.6 2.0*    Recent Results (from the past 240 hours)  Culture, blood (Routine x 2)     Status: None (Preliminary result)   Collection Time: 04/24/24 10:53 AM   Specimen: BLOOD  Result Value Ref Range Status   Specimen Description BLOOD LEFT ASSIST CONTROL  Final   Special Requests   Final    BOTTLES DRAWN AEROBIC AND ANAEROBIC Blood Culture adequate volume   Culture   Final    NO GROWTH < 24 HOURS Performed at [facility], [address] [place], [place] [address]    Report Status PENDING  Incomplete  Resp panel by RT-PCR (RSV, Flu A&B, Covid) Anterior Nasal Swab     Status: None   Collection Time: 04/24/24 11:19 AM   Specimen: Anterior Nasal Swab  Result Value Ref Range Status   SARS Coronavirus 2 by RT PCR NEGATIVE NEGATIVE Final    Comment: (NOTE) SARS-CoV-2 target nucleic acids are NOT DETECTED.  The SARS-CoV-2 RNA is generally detectable in upper respiratory specimens during the acute phase of infection. The lowest concentration of SARS-CoV-2 viral copies this assay can detect is 138 copies/mL. A negative result does not preclude SARS-Cov-2 infection and should not be used as the sole basis for treatment or other patient management decisions. A negative result may occur with  improper specimen collection/handling, submission of specimen other than nasopharyngeal swab, presence of viral mutation(s) within the areas targeted by this assay, and inadequate number of viral copies(<138 copies/mL). A negative result must be combined with clinical observations, patient history, and epidemiological information. The expected result is Negative.  Fact Sheet for Patients:  BloggerCourse.com  Fact Sheet for Healthcare Providers:  SeriousBroker.it  This test is no t yet  approved or cleared by the [place]  FDA and  has been authorized for detection and/or diagnosis of SARS-CoV-2 by FDA under an Emergency Use Authorization (EUA). This EUA will remain  in effect (meaning this test can be used) for the duration of the COVID-19 declaration under Section 564(b)(1) of the Act, 21 U.S.C.section 360bbb-3(b)(1), unless the authorization is terminated  or revoked sooner.       Influenza A by PCR NEGATIVE NEGATIVE Final   Influenza B by PCR NEGATIVE NEGATIVE Final    Comment: (NOTE) The Xpert Xpress SARS-CoV-2/FLU/RSV plus assay is intended as an  aid in the diagnosis of influenza from Nasopharyngeal swab specimens and should not be used as a sole basis for treatment. Nasal washings and aspirates are unacceptable for Xpert Xpress SARS-CoV-2/FLU/RSV testing.  Fact Sheet for Patients: BloggerCourse.com  Fact Sheet for Healthcare Providers: SeriousBroker.it  This test is not yet approved or cleared by the [place]  FDA and has been authorized for detection and/or diagnosis of SARS-CoV-2 by FDA under an Emergency Use Authorization (EUA). This EUA will remain in effect (meaning this test can be used) for the duration of the COVID-19 declaration under Section 564(b)(1) of the Act, 21 U.S.C. section 360bbb-3(b)(1), unless the authorization is terminated or revoked.     Resp Syncytial Virus by PCR NEGATIVE NEGATIVE Final    Comment: (NOTE) Fact Sheet for Patients: BloggerCourse.com  Fact Sheet for Healthcare Providers: SeriousBroker.it  This test is not yet approved or cleared by the [place]  FDA and has been authorized for detection and/or diagnosis of SARS-CoV-2 by FDA under an Emergency Use Authorization (EUA). This EUA will remain in effect (meaning this test can be used) for the duration of the COVID-19 declaration under Section 564(b)(1) of  the Act, 21 U.S.C. section 360bbb-3(b)(1), unless the authorization is terminated or revoked.  Performed at [facility], [address] [place], [place] [address]   Culture, blood (Routine X 2) w Reflex to ID Panel     Status: None (Preliminary result)   Collection Time: 04/24/24 11:40 AM   Specimen: BLOOD  Result Value Ref Range Status   Specimen Description BLOOD BLOOD LEFT WRIST  Final   Special Requests   Final    BOTTLES DRAWN AEROBIC AND ANAEROBIC Blood Culture results may not be optimal due to an inadequate volume of blood received in culture bottles   Culture   Final    NO GROWTH < 24 HOURS Performed at [facility], [address], [place], [place] [address]    Report Status PENDING  Incomplete  MRSA Next Gen by PCR, Nasal     Status: None   Collection Time: 04/24/24  5:20 PM   Specimen: Nasal Mucosa; Nasal Swab  Result Value Ref Range Status   MRSA by PCR Next Gen NOT DETECTED NOT DETECTED Final    Comment: (NOTE) The GeneXpert MRSA Assay (FDA approved for NASAL specimens only), is one component of a comprehensive MRSA colonization surveillance program. It is not intended to diagnose MRSA infection nor to guide or monitor treatment for MRSA infections. Test performance is not FDA approved in patients less than 28 years old. Performed at [facility], [address] [place], [place] [address]          Radiology Studies: CT Head Wo Contrast Result Date: 04/24/2024 CLINICAL DATA:  ams EXAM: CT HEAD WITHOUT CONTRAST TECHNIQUE: Contiguous axial images were obtained from the base of the skull through the vertex without intravenous contrast. RADIATION DOSE REDUCTION: This exam was performed according to the departmental dose-optimization program which includes automated exposure control, adjustment of the mA and/or kV according to patient size and/or use of iterative reconstruction technique. COMPARISON:  CT scan head from 03/14/2024. FINDINGS: Brain: No evidence of acute  infarction, hemorrhage, hydrocephalus, extra-axial collection or mass lesion/mass effect. There is bilateral periventricular hypodensity, which is non-specific but most likely seen in the settings of microvascular ischemic changes. Moderate in extent. 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 Otherwise normal appearance of brain parenchyma. Redemonstration of patient's known sellar/suprasellar mass. No significant interval change. Ventricles are normal. Cerebral volume is  age appropriate. Vascular: No hyperdense vessel or unexpected calcification. Intracranial arteriosclerosis. Skull: Normal. Negative for fracture or focal lesion. Sinuses/Orbits: No acute finding. Other: Visualized mastoid air cells are unremarkable. No mastoid effusion. IMPRESSION: *No acute intracranial abnormality. No significant interval change. Electronically Signed   By: Beula Brunswick M.D.   On: 04/24/2024 14:00   CT Chest Wo Contrast Result Date: 04/24/2024 CLINICAL DATA:  cf pna.  Increased altered mental status. EXAM: CT CHEST WITHOUT CONTRAST TECHNIQUE: Multidetector CT imaging of the chest was performed following the standard protocol without IV contrast. RADIATION DOSE REDUCTION: This exam was performed according to the departmental dose-optimization program which includes automated exposure control, adjustment of the mA and/or kV according to patient size and/or use of iterative reconstruction technique. COMPARISON:  CT scan chest from 12/25/2023. FINDINGS: Cardiovascular: Normal cardiac size. No pericardial effusion. No aortic aneurysm. There are coronary artery calcifications, in keeping with coronary artery disease. There are also mild peripheral atherosclerotic vascular calcifications of thoracic aorta and its major branches. Mediastinum/Nodes: Visualized thyroid  gland appears grossly unremarkable. No solid / cystic mediastinal masses. The esophagus is nondistended precluding optimal assessment. There are few  enlarged mediastinal lymph nodes, which appear grossly similar to the prior study and favored benign/reactive in the given clinical setting. No axillary lymphadenopathy by size criteria. Evaluation of bilateral hila is limited due to lack on intravenous contrast: however, no large hilar lymphadenopathy identified. Lungs/Pleura: The central tracheo-bronchial tree is patent. There are heterogeneous solid and ground-glass opacities throughout left lung and patchy opacities in the right lung lower lobe, compatible with multilobar pneumonia. Follow-up to clearing is recommended. There is no associated lung mass, pleural effusion or pneumothorax. No suspicious lung nodule. Upper Abdomen: Surgically absent gallbladder. Stable left adrenal adenomas. Remaining visualized upper abdominal viscera within normal limits. Musculoskeletal: The visualized soft tissues of the chest wall are grossly unremarkable. No suspicious osseous lesions. There are mild to moderate multilevel degenerative changes in the visualized spine. IMPRESSION: 1. There are heterogeneous, solid and ground-glass opacities throughout left lung and patchy opacities in the right lung lower lobe, compatible with multilobar pneumonia. Follow-up to clearing is recommended. 2. Multiple other nonacute observations, as described above. Aortic Atherosclerosis (ICD10-I70.0). Electronically Signed   By: Beula Brunswick M.D.   On: 04/24/2024 13:56   DG Chest Portable 1 View Result Date: 04/24/2024 CLINICAL DATA:  Fever and cough. EXAM: PORTABLE CHEST 1 VIEW COMPARISON:  Chest radiograph dated 03/11/2024. FINDINGS: Diffuse interstitial nodular density throughout the left lung concerning for pneumonia, possibly atypical in etiology. Aspiration is not excluded. No consolidative changes. There is no pleural effusion pneumothorax. The cardiac silhouette is within limits. Atherosclerotic calcification of the aorta. No acute osseous pathology. IMPRESSION: Diffuse interstitial  nodular density throughout the left lung concerning for pneumonia. Electronically Signed   By: Angus Bark M.D.   On: 04/24/2024 11:40        Scheduled Meds:  atorvastatin   40 mg Oral Daily   Chlorhexidine  Gluconate Cloth  6 each Topical Daily   clopidogrel   75 mg Oral Daily   cycloSPORINE   1 drop Both Eyes BID   enoxaparin  (LOVENOX ) injection  40 mg Subcutaneous Q24H   guaiFENesin -dextromethorphan   15 mL Oral Q8H   insulin  aspart  0-5 Units Subcutaneous QHS   insulin  aspart  0-9 Units Subcutaneous TID WC   levothyroxine   50 mcg Oral Daily   mirabegron  ER  25 mg Oral Daily   pregabalin   150 mg Oral BID   venlafaxine  XR  150  mg Oral [id number]   vortioxetine  HBr  10 mg Oral Daily   Continuous Infusions:  sodium chloride  10 mL/hr at 04/25/24 0525   sodium chloride  100 mL/hr at 04/25/24 1610   azithromycin      cefTRIAXone  (ROCEPHIN )  IV     norepinephrine  (LEVOPHED ) Adult infusion Stopped (04/24/24 2253)     LOS: 1 day    Critical care time spent: 55 minutes    Ahmeer Tuman Loran Rock, DO [facility]  If 7PM-7AM, please contact night-coverage [email or web address] 04/25/2024, 11:08 AM

## 2024-04-25 NOTE — TOC Initial Note (Signed)
 Transition of Care [facility]) - Initial/Assessment Note    Patient Details  Name: [name] White MRN: [id number] Date of Birth: September 03, 1941  Transition of [facility]) CM/SW Contact:    Orelia Binet, RN Phone Number: 04/25/2024, 1:47 PM  Clinical Narrative:       Patient admitted with septic shock. Patient has a high risk for readmission. Patient recently admitted with palliative discussions. Patient went to [facility], now resides at [facility]. TOC following for DC plan, no PT orders, work up continues. Expected Discharge Plan: Assisted Living Barriers to Discharge: Continued Medical Work up   Patient Goals and CMS Choice Patient states their goals for this hospitalization and ongoing recovery are:: Pending work up for discharge plan CMS Medicare.gov Compare Post Acute Care list provided to:: Patient Represenative (must comment) Choice offered to / list presented to : Adult Children     Expected Discharge Plan and Services      Living arrangements for the past 2 months: Assisted Living Facility         Prior Living Arrangements/Services Living arrangements for the past 2 months: Assisted Living Facility Lives with:: Facility Resident Patient language and need for interpreter reviewed:: Yes        Need for Family Participation in Patient Care: Yes (Comment) Care giver support system in place?: Yes (comment) Current home services: DME Criminal Activity/Legal Involvement Pertinent to Current Situation/Hospitalization: No - Comment as needed      Orientation: : Oriented to Self, Oriented to Place, Oriented to  Time Alcohol / Substance Use: Not Applicable Psych Involvement: No (comment)  Admission diagnosis:  Septic shock (HCC) [A41.9, R65.21] Patient Active Problem List   Diagnosis Date Noted   Septic shock (HCC) 04/24/2024   Multifocal pneumonia 04/24/2024   Acute respiratory failure with hypoxia (HCC) 04/24/2024   CVA (cerebral vascular accident) (HCC) 03/12/2024    Acute lower UTI 03/12/2024   Type 2 diabetes mellitus with peripheral neuropathy (HCC) 03/12/2024   Anxiety and depression 03/12/2024   GERD without esophagitis 03/12/2024   Acute encephalopathy 03/11/2024   Rhabdomyolysis 12/27/2023   Pressure injury of skin 12/27/2023   UTI (urinary tract infection) 12/26/2023   Sepsis due to urinary tract infection (HCC) 12/26/2023   Acute metabolic encephalopathy 12/26/2023   Status post transsphenoidal pituitary resection (HCC) 06/04/2020   Pituitary adenoma with extrasellar extension (HCC) 06/04/2020   Pituitary tumor 05/29/2020   Dizzinesses 02/02/2020   Acute pain of right shoulder 02/02/2020   Encounter for screening mammogram for malignant neoplasm of breast 01/28/2020   Vitamin D  deficiency 01/28/2020   Paroxysmal supraventricular tachycardia (HCC) 06/19/2014   Rectocele 03/19/2013   Constipation 03/19/2013   HYPERCHOLESTEROLEMIA 02/09/2009   Essential hypertension 02/09/2009   Osteoporosis 02/09/2009   Post-menopausal 02/09/2009   HEAT INTOLERANCE 02/01/2008   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   Hypothyroidism 09/14/2007   Diabetes (HCC) 09/14/2007   Migraine headache 09/14/2007   SUPERFICIAL PHLEBITIS 09/14/2007   PANCREATITIS 09/14/2007   MENOPAUSAL SYNDROME 09/14/2007   FATIGUE 09/14/2007   PCP:  Omie Bickers, MD Pharmacy:   [facility] - [place], [place] - [address] [place] [address] Phone: [phone] Fax: [phone]  [facility] [place], [place] - [address] [place] [address] Phone: [phone] Fax: [phone]     Social Drivers of Health (SDOH) Social History: SDOH Screenings   Food Insecurity: No Food Insecurity (04/24/2024)  Housing: Low Risk  (04/24/2024)  Transportation Needs: No Transportation Needs (04/24/2024)  Utilities: Not At Risk (04/24/2024)  Depression (PHQ2-9): Low Risk  (01/28/2020)  Social Connections: Socially Isolated (04/24/2024)  Tobacco  Use: Low Risk  (04/24/2024)   SDOH Interventions:     Readmission Risk Interventions    04/25/2024    1:46 PM 12/27/2023    1:21 PM  Readmission Risk Prevention Plan  Transportation Screening Complete Complete  PCP or Specialist Appt within 3-5 Days Not Complete   Home Care Screening  Complete  Medication Review (RN CM)  Complete  HRI or Home Care Consult Complete   Social Work Consult for Recovery Care Planning/Counseling Complete   Palliative Care Screening Not Complete   Medication Review Oceanographer) Complete

## 2024-04-25 NOTE — Evaluation (Signed)
 Clinical/Bedside Swallow Evaluation Patient Details  Name: [name] White MRN: [id number] Date of Birth: 1941-01-26  Today's Date: 04/25/2024 Time: SLP Start Time (ACUTE ONLY): 1342 SLP Stop Time (ACUTE ONLY): 1404 SLP Time Calculation (min) (ACUTE ONLY): 22 min  Past Medical History:  Past Medical History:  Diagnosis Date   Anxiety    Asthma    ASYMPTOMATIC POSTMENOPAUSAL STATUS 02/09/2009   Cataract    Constipation 03/19/2013   Depression    DIABETES MELLITUS, TYPE II 09/14/2007   HYPERCHOLESTEROLEMIA 02/09/2009   HYPERTENSION 02/09/2009   HYPOTHYROIDISM 09/14/2007   MIGRAINE HEADACHE 09/14/2007   Neuropathy    OSTEOPOROSIS 02/09/2009   PANCREATITIS 09/14/2007   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   PONV (postoperative nausea and vomiting)    Rectocele 03/19/2013   Shortness of breath    SUPERFICIAL PHLEBITIS 09/14/2007   Varicose veins    Past Surgical History:  Past Surgical History:  Procedure Laterality Date   ABDOMINAL HYSTERECTOMY     ANTERIOR AND POSTERIOR REPAIR N/A 04/02/2013   Procedure: ANTERIOR (CYSTOCELE) AND POSTERIOR REPAIR (RECTOCELE);  Surgeon: Albino Hum, MD;  Location: [facility];  Service: Gynecology;  Laterality: N/A;   APPENDECTOMY     BRAIN SURGERY     CATARACT EXTRACTION W/PHACO  12/05/2011   Procedure: CATARACT EXTRACTION PHACO AND INTRAOCULAR LENS PLACEMENT (IOC);  Surgeon: Anner Kill;  Location: [facility];  Service: Ophthalmology;  Laterality: Left;  CDE:9.65   CHOLECYSTECTOMY     CRANIOTOMY N/A 06/04/2020   Procedure: Endoscopic Transphenoidal Resection of Recurrent PituitaryTumor;  Surgeon: Elna Haggis, MD;  Location: [facility];  Service: Neurosurgery;  Laterality: N/A;   CYSTOSCOPY     ENDOVENOUS ABLATION SAPHENOUS VEIN W/ LASER  11-03-2011   right greater saphenous vein   left leg done 10-2011   EYE SURGERY  98   right cataract extraction 98   LAPAROSCOPIC NISSEN FUNDOPLICATION     NM ESOPHAGEAL REFLUX  08-11-11   PITUITARY EXCISION  10/1997   POSTERIOR REPAIR      TRANSNASAL APPROACH N/A 06/04/2020   Procedure: TRANSNASAL APPROACH;  Surgeon: Ammon Bales, MD;  Location: [facility] OR;  Service: ENT;  Laterality: N/A;   TRANSPHENOIDAL / TRANSNASAL HYPOPHYSECTOMY / RESECTION PITUITARY TUMOR  08-11-11   HPI:  [name] White is a 83 y.o. female with medical history significant for diabetes, hypertension, stroke, pituitary adenoma, PSVT, anxiety and depression.  Patient was brought to the ED from her group facility with reports of confusion that started today, also with cough, fever.   At the time of my evaluation, patient is lethargic, responds to direction but unable to answer questions or give history. Recent hospitalization 3/17 to 3/25 for acute/early subacute stroke with concomitant acute metabolic encephalopathy.  Was discharged to SNF.     Patient has been admitted with septic shock secondary to multifocal pneumonia along with associated acute hypoxemic respiratory failure and acute metabolic encephalopathy. BSE requested.    Assessment / Plan / Recommendation  Clinical Impression  Clinical swallow evaluation completed at bedside. Pt presents with congested cough and audible breathing, which clears with cued cough. She expectorated yellow phlegm. Congestion does not appear to be near upper airway. Oral motor exam is WNL. Pt denies difficulty swallowing, but also presents with some confusion. She was assessed with ice chips, thin water  via cup/straw, puree, and graham crackers all self presented. Pt does not exhibit coughing with PO intake. Pt is at risk for aspiration with poor trunk support, AMS, compromised respiratory status, and current bedbound  status. Recommend continue diet as ordered, D2 and thin with supervision for PO intake and PO medications whole in puree. Monitor or signs of aspiration. SLP will follow in acute setting and determine if MBSS appropriate given PNA. Above to RN. SLP Visit Diagnosis: Dysphagia, unspecified (R13.10)    Aspiration  Risk  Mild aspiration risk    Diet Recommendation Dysphagia 2 (Fine chop);Thin liquid    Liquid Administration via: Cup;Straw Medication Administration: Whole meds with puree Supervision: Patient able to self feed;Full supervision/cueing for compensatory strategies Compensations: Slow rate;Small sips/bites Postural Changes: Seated upright at 90 degrees;Remain upright for at least 30 minutes after po intake    Other  Recommendations Oral Care Recommendations: Oral care BID;Staff/trained caregiver to provide oral care    Recommendations for follow up therapy are one component of a multi-disciplinary discharge planning process, led by the attending physician.  Recommendations may be updated based on patient status, additional functional criteria and insurance authorization.  Follow up Recommendations Follow physician's recommendations for discharge plan and follow up therapies      Assistance Recommended at Discharge    Functional Status Assessment Patient has had a recent decline in their functional status and demonstrates the ability to make significant improvements in function in a reasonable and predictable amount of time.  Frequency and Duration min 2x/week  1 week       Prognosis Prognosis for improved oropharyngeal function: Fair Barriers to Reach Goals: Cognitive deficits      Swallow Study   General Date of Onset: 04/24/24 HPI: [name] White is a 83 y.o. female with medical history significant for diabetes, hypertension, stroke, pituitary adenoma, PSVT, anxiety and depression.  Patient was brought to the ED from her group facility with reports of confusion that started today, also with cough, fever.   At the time of my evaluation, patient is lethargic, responds to direction but unable to answer questions or give history. Recent hospitalization 3/17 to 3/25 for acute/early subacute stroke with concomitant acute metabolic encephalopathy.  Was discharged to SNF.     Patient has  been admitted with septic shock secondary to multifocal pneumonia along with associated acute hypoxemic respiratory failure and acute metabolic encephalopathy. BSE requested. Type of Study: Bedside Swallow Evaluation Previous Swallow Assessment: BSE March 2025 D2/thin Diet Prior to this Study: Dysphagia 2 (finely chopped);Thin liquids (Level 0) Temperature Spikes Noted: No Respiratory Status: Room air History of Recent Intubation: No Behavior/Cognition: Alert;Cooperative;Pleasant mood;Confused Oral Cavity Assessment: Within Functional Limits Oral Care Completed by SLP: Yes Oral Cavity - Dentition: Adequate natural dentition;Missing dentition Vision: Functional for self-feeding Self-Feeding Abilities: Able to feed self;Needs set up Patient Positioning: Upright in bed Baseline Vocal Quality: Normal (congested, clears with cued cough and expectorates yellow phlegm) Volitional Cough: Strong;Congested Volitional Swallow: Able to elicit    Oral/Motor/Sensory Function Overall Oral Motor/Sensory Function: Within functional limits   Ice Chips Ice chips: Within functional limits Presentation: Spoon   Thin Liquid Thin Liquid: Within functional limits Presentation: Cup;Self Fed;Straw    Nectar Thick Nectar Thick Liquid: Not tested   Honey Thick Honey Thick Liquid: Not tested   Puree Puree: Within functional limits Presentation: Spoon   Solid     Solid: Within functional limits Presentation: Self Fed     Thank you,  Claudetta Cuba, CCC-SLP [phone]  Averie Meiner 04/25/2024,2:14 PM

## 2024-04-25 NOTE — Progress Notes (Signed)
 Patient seems to be doing better than when she came in. She is currently alert and oriented x3. However, patient seems to be hallucinating this morning. Patient is currently spitting sputum into a bag stating "this is all of the dogs cremations joining together". Dr. Mason Sole made aware of patient behavior.

## 2024-04-25 NOTE — Plan of Care (Signed)
  Problem: Education: Goal: Knowledge of General Education information will improve Description: Including pain rating scale, medication(s)/side effects and non-pharmacologic comfort measures Outcome: Not Progressing   Problem: Health Behavior/Discharge Planning: Goal: Ability to manage health-related needs will improve Outcome: Not Progressing   Problem: Clinical Measurements: Goal: Ability to maintain clinical measurements within normal limits will improve Outcome: Progressing Goal: Will remain free from infection Outcome: Not Progressing Goal: Diagnostic test results will improve Outcome: Progressing Goal: Respiratory complications will improve Outcome: Progressing Goal: Cardiovascular complication will be avoided Outcome: Progressing   Problem: Activity: Goal: Risk for activity intolerance will decrease Outcome: Not Progressing   Problem: Nutrition: Goal: Adequate nutrition will be maintained Outcome: Not Progressing   Problem: Coping: Goal: Level of anxiety will decrease Outcome: Not Progressing   Problem: Elimination: Goal: Will not experience complications related to bowel motility Outcome: Progressing Goal: Will not experience complications related to urinary retention Outcome: Progressing   Problem: Pain Managment: Goal: General experience of comfort will improve and/or be controlled Outcome: Progressing   Problem: Safety: Goal: Ability to remain free from injury will improve Outcome: Progressing   Problem: Skin Integrity: Goal: Risk for impaired skin integrity will decrease Outcome: Progressing   Problem: Activity: Goal: Ability to tolerate increased activity will improve Outcome: Not Progressing   Problem: Clinical Measurements: Goal: Ability to maintain a body temperature in the normal range will improve Outcome: Progressing   Problem: Respiratory: Goal: Ability to maintain adequate ventilation will improve Outcome: Progressing Goal: Ability  to maintain a clear airway will improve Outcome: Progressing   Problem: Education: Goal: Ability to describe self-care measures that may prevent or decrease complications (Diabetes Survival Skills Education) will improve Outcome: Not Progressing Goal: Individualized Educational Video(s) Outcome: Not Progressing   Problem: Coping: Goal: Ability to adjust to condition or change in health will improve Outcome: Progressing   Problem: Fluid Volume: Goal: Ability to maintain a balanced intake and output will improve Outcome: Progressing   Problem: Health Behavior/Discharge Planning: Goal: Ability to identify and utilize available resources and services will improve Outcome: Progressing Goal: Ability to manage health-related needs will improve Outcome: Progressing   Problem: Metabolic: Goal: Ability to maintain appropriate glucose levels will improve Outcome: Progressing   Problem: Nutritional: Goal: Maintenance of adequate nutrition will improve Outcome: Not Progressing Goal: Progress toward achieving an optimal weight will improve Outcome: Not Progressing   Problem: Skin Integrity: Goal: Risk for impaired skin integrity will decrease Outcome: Progressing   Problem: Tissue Perfusion: Goal: Adequacy of tissue perfusion will improve Outcome: Progressing

## 2024-04-26 ENCOUNTER — Inpatient Hospital Stay (HOSPITAL_COMMUNITY)

## 2024-04-26 DIAGNOSIS — A419 Sepsis, unspecified organism: Secondary | ICD-10-CM | POA: Diagnosis not present

## 2024-04-26 DIAGNOSIS — R6521 Severe sepsis with septic shock: Secondary | ICD-10-CM | POA: Diagnosis not present

## 2024-04-26 LAB — CBC
HCT: 33.8 % — ABNORMAL LOW (ref 36.0–46.0)
Hemoglobin: 10.6 g/dL — ABNORMAL LOW (ref 12.0–15.0)
MCH: 28.4 pg (ref 26.0–34.0)
MCHC: 31.4 g/dL (ref 30.0–36.0)
MCV: 90.6 fL (ref 80.0–100.0)
Platelets: 220 10*3/uL (ref 150–400)
RBC: 3.73 MIL/uL — ABNORMAL LOW (ref 3.87–5.11)
RDW: 15.7 % — ABNORMAL HIGH (ref 11.5–15.5)
WBC: 11 10*3/uL — ABNORMAL HIGH (ref 4.0–10.5)
nRBC: 0 % (ref 0.0–0.2)

## 2024-04-26 LAB — BASIC METABOLIC PANEL WITH GFR
Anion gap: 7 (ref 5–15)
BUN: 9 mg/dL (ref 8–23)
CO2: 27 mmol/L (ref 22–32)
Calcium: 7.7 mg/dL — ABNORMAL LOW (ref 8.9–10.3)
Chloride: 106 mmol/L (ref 98–111)
Creatinine, Ser: 0.61 mg/dL (ref 0.44–1.00)
GFR, Estimated: >60 mL/min (ref >60)
Glucose, Bld: 103 mg/dL — ABNORMAL HIGH (ref 70–99)
Potassium: 3.5 mmol/L (ref 3.5–5.1)
Sodium: 140 mmol/L (ref 135–145)

## 2024-04-26 LAB — GLUCOSE, CAPILLARY
Glucose-Capillary: 114 mg/dL — ABNORMAL HIGH (ref 70–99)
Glucose-Capillary: 120 mg/dL — ABNORMAL HIGH (ref 70–99)
Glucose-Capillary: 170 mg/dL — ABNORMAL HIGH (ref 70–99)

## 2024-04-26 LAB — BLOOD GAS, ARTERIAL
Acid-Base Excess: 1.1 mmol/L (ref 0.0–2.0)
Bicarbonate: 25.1 mmol/L (ref 20.0–28.0)
Drawn by: 37588
O2 Saturation: 94.2 %
Patient temperature: 37
pCO2 arterial: 37 mmHg (ref 32–48)
pH, Arterial: 7.44 (ref 7.35–7.45)
pO2, Arterial: 62 mmHg — ABNORMAL LOW (ref 83–108)

## 2024-04-26 LAB — MAGNESIUM: Magnesium: 1.4 mg/dL — ABNORMAL LOW (ref 1.7–2.4)

## 2024-04-26 LAB — BRAIN NATRIURETIC PEPTIDE: B Natriuretic Peptide: 438 pg/mL — ABNORMAL HIGH (ref 0.0–100.0)

## 2024-04-26 LAB — LACTIC ACID, PLASMA: Lactic Acid, Venous: 1.3 mmol/L (ref 0.5–1.9)

## 2024-04-26 MED ORDER — ACETYLCYSTEINE 20 % IN SOLN: 4.0000 mL | Freq: Three times a day (TID) | RESPIRATORY_TRACT | Status: DC

## 2024-04-26 MED ORDER — MAGNESIUM SULFATE 2 GM/50ML IV SOLN
2.0000 g | Freq: Once | INTRAVENOUS | Status: AC
  Administered 2024-04-26: 2 g via INTRAVENOUS
  Filled 2024-04-26: qty 50

## 2024-04-26 MED ORDER — ENSURE ENLIVE PO LIQD
237.0000 mL | Freq: Two times a day (BID) | ORAL | Status: DC
  Administered 2024-04-27 – 2024-05-01 (×6): 237 mL via ORAL

## 2024-04-26 MED ORDER — IPRATROPIUM-ALBUTEROL 0.5-2.5 (3) MG/3ML IN SOLN
3.0000 mL | RESPIRATORY_TRACT | Status: DC
  Administered 2024-04-26 – 2024-04-27 (×4): 3 mL via RESPIRATORY_TRACT
  Filled 2024-04-26 (×5): qty 3

## 2024-04-26 MED ORDER — FUROSEMIDE 10 MG/ML IJ SOLN
INTRAMUSCULAR | Status: AC
  Administered 2024-04-26: 40 mg via INTRAVENOUS
  Filled 2024-04-26: qty 4

## 2024-04-26 MED ORDER — LORAZEPAM 2 MG/ML IJ SOLN
INTRAMUSCULAR | Status: AC
  Administered 2024-04-26: 0.5 mg via INTRAVENOUS
  Filled 2024-04-26: qty 1

## 2024-04-26 MED ORDER — METHYLPREDNISOLONE SODIUM SUCC 40 MG IJ SOLR
40.0000 mg | Freq: Two times a day (BID) | INTRAMUSCULAR | Status: DC
  Administered 2024-04-26 – 2024-05-01 (×10): 40 mg via INTRAVENOUS
  Filled 2024-04-26 (×10): qty 1

## 2024-04-26 MED ORDER — ACETYLCYSTEINE 20 % IN SOLN
4.0000 mL | Freq: Three times a day (TID) | RESPIRATORY_TRACT | Status: DC
  Administered 2024-04-26 – 2024-05-01 (×13): 4 mL via RESPIRATORY_TRACT
  Filled 2024-04-26 (×13): qty 4

## 2024-04-26 MED ORDER — LORAZEPAM 2 MG/ML IJ SOLN
0.5000 mg | Freq: Four times a day (QID) | INTRAMUSCULAR | Status: DC | PRN
  Administered 2024-04-27: 0.5 mg via INTRAVENOUS
  Filled 2024-04-26: qty 1

## 2024-04-26 MED ORDER — FUROSEMIDE 10 MG/ML IJ SOLN: 40.0000 mg | Freq: Once | INTRAMUSCULAR | Status: AC

## 2024-04-26 NOTE — Care Management Important Message (Signed)
 Important Message  Patient Details  Name: [name] White MRN: [id number] Date of Birth: Feb 27, 1941   Important Message Given:  Yes - Medicare IM     [name] [name] 04/26/2024, 1:49 PM

## 2024-04-26 NOTE — Progress Notes (Signed)
 Speech Language Pathology Treatment: Dysphagia  Patient Details Name: TAZIAH KLOSTERMAN MRN: [id number] DOB: [birth year] Today's Date: 04/26/2024 Time: 8295-6213 SLP Time Calculation (min) (ACUTE ONLY): 24 min  Assessment / Plan / Recommendation Clinical Impression  Ongoing diagnostic dysphagia therapy provided. Pt with very congested cough, audible respirations and note congestion does not seem to clear with cough/throat clear as it did for BSE yesterday. Agree congestion does not appear to be near upper airway and Pt continues to deny difficulty swallowing. SLP provided trials of regular and thin liquids and note no coughing. Recommend continue with D2/thin liquid diet and recommend pursue MBSS today if radiology can accommodate to r/o silent aspiration given PNA. Above to RN and MD   HPI HPI: ONEAL GERBITZ is a 83 y.o. female with medical history significant for diabetes, hypertension, stroke, pituitary adenoma, PSVT, anxiety and depression.  Patient was brought to the ED from her group facility with reports of confusion that started today, also with cough, fever.   At the time of my evaluation, patient is lethargic, responds to direction but unable to answer questions or give history. Recent hospitalization 3/17 to 3/25 for acute/early subacute stroke with concomitant acute metabolic encephalopathy.  Was discharged to SNF.     Patient has been admitted with septic shock secondary to multifocal pneumonia along with associated acute hypoxemic respiratory failure and acute metabolic encephalopathy. BSE requested.      SLP Plan  Continue with current plan of care      Recommendations for follow up therapy are one component of a multi-disciplinary discharge planning process, led by the attending physician.  Recommendations may be updated based on patient status, additional functional criteria and insurance authorization.    Recommendations  Diet recommendations: Thin liquid;Dysphagia 2 (fine  chop) Liquids provided via: Cup;Straw Medication Administration: Whole meds with puree Supervision: Patient able to self feed;Staff to assist with self feeding;Intermittent supervision to cue for compensatory strategies Compensations: Slow rate;Small sips/bites Postural Changes and/or Swallow Maneuvers: Seated upright 90 degrees                  Oral care BID;Staff/trained caregiver to provide oral care           Continue with current plan of care    Callahan Peddie H. Vergil Glasser, CCC-SLP Speech Language Pathologist  Florina Husbands  04/26/2024, 9:37 AM

## 2024-04-26 NOTE — Plan of Care (Signed)
  Problem: Education: Goal: Knowledge of General Education information will improve Description: Including pain rating scale, medication(s)/side effects and non-pharmacologic comfort measures Outcome: Progressing   Problem: Health Behavior/Discharge Planning: Goal: Ability to manage health-related needs will improve Outcome: Progressing   Problem: Clinical Measurements: Goal: Ability to maintain clinical measurements within normal limits will improve Outcome: Progressing Goal: Will remain free from infection Outcome: Progressing Goal: Diagnostic test results will improve Outcome: Progressing Goal: Respiratory complications will improve Outcome: Progressing Goal: Cardiovascular complication will be avoided Outcome: Progressing   Problem: Activity: Goal: Risk for activity intolerance will decrease Outcome: Not Progressing   Problem: Nutrition: Goal: Adequate nutrition will be maintained Outcome: Not Progressing   Problem: Coping: Goal: Level of anxiety will decrease Outcome: Progressing   Problem: Elimination: Goal: Will not experience complications related to bowel motility Outcome: Progressing Goal: Will not experience complications related to urinary retention Outcome: Progressing   Problem: Pain Managment: Goal: General experience of comfort will improve and/or be controlled Outcome: Progressing   Problem: Safety: Goal: Ability to remain free from injury will improve Outcome: Progressing   Problem: Skin Integrity: Goal: Risk for impaired skin integrity will decrease Outcome: Progressing   Problem: Activity: Goal: Ability to tolerate increased activity will improve Outcome: Not Progressing   Problem: Clinical Measurements: Goal: Ability to maintain a body temperature in the normal range will improve Outcome: Progressing   Problem: Respiratory: Goal: Ability to maintain adequate ventilation will improve Outcome: Progressing Goal: Ability to maintain a  clear airway will improve Outcome: Not Progressing   Problem: Education: Goal: Ability to describe self-care measures that may prevent or decrease complications (Diabetes Survival Skills Education) will improve Outcome: Progressing Goal: Individualized Educational Video(s) Outcome: Progressing   Problem: Coping: Goal: Ability to adjust to condition or change in health will improve Outcome: Progressing   Problem: Health Behavior/Discharge Planning: Goal: Ability to identify and utilize available resources and services will improve Outcome: Progressing Goal: Ability to manage health-related needs will improve Outcome: Progressing   Problem: Metabolic: Goal: Ability to maintain appropriate glucose levels will improve Outcome: Progressing   Problem: Skin Integrity: Goal: Risk for impaired skin integrity will decrease Outcome: Progressing   Problem: Tissue Perfusion: Goal: Adequacy of tissue perfusion will improve Outcome: Progressing

## 2024-04-26 NOTE — Progress Notes (Signed)
 PROGRESS NOTE    [name] White  ZOX:[id number] DOB: 1941/07/17 DOA: 04/24/2024 PCP: Omie Bickers, MD   Brief Narrative:    [name] White is a 83 y.o. female with medical history significant for diabetes, hypertension, stroke, pituitary adenoma, PSVT, anxiety and depression. Patient was brought to the ED from her group facility with reports of confusion that started today, also with cough, fever.   At the time of my evaluation, patient is lethargic, responds to direction but unable to answer questions or give history.  Patient's son [name] White) is at bedside.    Recent hospitalization 3/17 to 3/25 for acute/early subacute stroke with concomitant acute metabolic encephalopathy.  Was discharged to SNF.  Patient has been admitted with septic shock secondary to multifocal pneumonia along with associated acute hypoxemic respiratory failure and acute metabolic encephalopathy.  Assessment & Plan:   Principal Problem:   Septic shock (HCC) Active Problems:   Acute metabolic encephalopathy   Multifocal pneumonia   Acute respiratory failure with hypoxia (HCC)   CVA (cerebral vascular accident) (HCC)   Hypothyroidism   Diabetes (HCC)   Essential hypertension   Pressure injury of skin   Anxiety and depression  Assessment and Plan:   Septic shock (HCC)-resolved Septic shock secondary to multifocal pneumonia.  Meeting severe sepsis criteria with fever of 101.1, tachypnea, respiratory rate 16-25, hypotension, blood pressure down to 77/48, now improved on pressors.  Lactic acid 1.6 > 2.  Days of endorgan dysfunction -acute respiratory failure, altered mental status. UA not suggestive of UTI. - Norepinephrine  has currently been weaned off, monitor closely-currently with stable blood pressure readings. - Patient has received quite a bit of fluid and now appears somewhat congested.  See below.   Acute respiratory failure with hypoxia (HCC)-worsening O2 sats down to 80%, currently on 2 L.   Sats greater than 93%.  ABG showed pH of 7.4, pCO2 of 47.  Likely secondary to multifocal pneumonia. - Appears to be worsening with volume overload and now requiring Lasix  for diuresis -Ativan  to be given for anxiety   Multifocal pneumonia Multifocal pneumonia with septic shock and acute hypoxic respiratory failure. CT chest shows multifocal pneumonia.  COVID influenza RSV negative.  MRSA negative. -Continue IV ceftriaxone  and azithromycin  -Follow-up blood cultures - Check urine Legionella -Urine strep - Bronchodilators, mucolytics   Acute metabolic encephalopathy Likely secondary to septic shock, multifocal pneumonia.  Patient confused and lethargic.  Head CT negative for acute abnormality. -Management per above - Resume certain psychoactive medications   CVA (cerebral vascular accident) (HCC) Resume Plavix    Anxiety and depression Resume certain psychoactive medications as noted below   Essential hypertension Currently hypotensive requiring pressors - Hold Carvedilol    Diabetes (HCC) - HgbA1c 7.0%. - SSI- s - Metformin  discontinued after last hospitalization last month.   Hypothyroidism Resume Synthroid  in a.m.    DVT prophylaxis:Lovenox  Code Status: DNR Family Communication: Discussed with daughters at bedside 5/2 Disposition Plan:  Status is: Inpatient Remains inpatient appropriate because: Need for IV medications.  Skin Assessment:  I have examined the patient's skin and I agree with the wound assessment as performed by the wound care RN as outlined below:  Pressure Injury 04/24/24 Sacrum Mid Stage 1 -  Intact skin with non-blanchable redness of a localized area usually over a bony prominence. (Active)  04/24/24 1724  Location: Sacrum  Location Orientation: Mid  Staging: Stage 1 -  Intact skin with non-blanchable redness of a localized area usually over a bony prominence.  Wound Description (Comments):   Present on Admission:   Dressing Type Foam - Lift  dressing to assess site every shift 04/26/24 0900    Consultants:  None  Procedures:  None  Antimicrobials:  Anti-infectives (From admission, onward)    Start     Dose/Rate Route Frequency Ordered Stop   04/25/24 1600  vancomycin  (VANCOCIN ) IVPB 1000 mg/200 mL premix  Status:  Discontinued        1,000 mg 200 mL/hr over 60 Minutes Intravenous Every 24 hours 04/24/24 1542 04/24/24 1945   04/25/24 1200  azithromycin  (ZITHROMAX ) 500 mg in sodium chloride  0.9 % 250 mL IVPB        500 mg 250 mL/hr over 60 Minutes Intravenous Every 24 hours 04/24/24 2116     04/25/24 1100  cefTRIAXone  (ROCEPHIN ) 2 g in sodium chloride  0.9 % 100 mL IVPB        2 g 200 mL/hr over 30 Minutes Intravenous Every 24 hours 04/24/24 2116     04/24/24 1600  vancomycin  (VANCOREADY) IVPB 1250 mg/250 mL        1,250 mg 166.7 mL/hr over 90 Minutes Intravenous  Once 04/24/24 1541 04/25/24 0600   04/24/24 1530  ceFEPIme  (MAXIPIME ) 2 g in sodium chloride  0.9 % 100 mL IVPB        2 g 200 mL/hr over 30 Minutes Intravenous  Once 04/24/24 1517 04/24/24 1729   04/24/24 1130  cefTRIAXone  (ROCEPHIN ) 2 g in sodium chloride  0.9 % 100 mL IVPB        2 g 200 mL/hr over 30 Minutes Intravenous Once 04/24/24 1119 04/24/24 1225   04/24/24 1130  azithromycin  (ZITHROMAX ) 500 mg in sodium chloride  0.9 % 250 mL IVPB        500 mg 250 mL/hr over 60 Minutes Intravenous  Once 04/24/24 1119 04/24/24 1407       Subjective: Patient seen and evaluated today with worsening respiratory distress and congestion noted.  Chest x-ray appears to demonstrate some volume overload.  Objective: Vitals:   04/26/24 0900 04/26/24 1000 04/26/24 1100 04/26/24 1205  BP: 127/76 124/78 129/78   Pulse: 94 93 94   Resp: (!) 32 (!) 30 (!) 28   Temp:    98.4 F (36.9 C)  TempSrc:    Oral  SpO2: 91% 94% 95%   Weight:      Height:        Intake/Output Summary (Last 24 hours) at 04/26/2024 1227 Last data filed at 04/26/2024 0840 Gross per 24 hour  Intake  1341.55 ml  Output 200 ml  Net 1141.55 ml   Filed Weights   04/24/24 1045 04/24/24 1715  Weight: 61.3 kg 59.6 kg    Examination:  General exam: Appears calm and comfortable  Respiratory system: Congestion and wheezing noted bilaterally. Respiratory effort normal.  5 L nasal cannula Cardiovascular system: S1 & S2 heard, RRR.  Gastrointestinal system: Abdomen is soft Central nervous system: Alert and awake Extremities: No edema Skin: No significant lesions noted Psychiatry: Flat affect.    Data Reviewed: I have personally reviewed following labs and imaging studies  CBC: Recent Labs  Lab 04/24/24 1113 04/25/24 0429 04/26/24 0425  WBC 8.1 6.1 11.0*  NEUTROABS 6.1  --   --   HGB 13.7 11.8* 10.6*  HCT 41.2 37.0 33.8*  MCV 87.8 90.9 90.6  PLT 260 211 220   Basic Metabolic Panel: Recent Labs  Lab 04/24/24 1113 04/25/24 0429 04/26/24 0425  NA 128* 137 140  K 3.9  3.5 3.5  CL 93* 107 106  CO2 24 23 27   GLUCOSE 119* 178* 103*  BUN 12 12 9   CREATININE 0.63 0.52 0.61  CALCIUM  8.7* 7.6* 7.7*  MG  --   --  1.4*   GFR: Estimated Creatinine Clearance: 48.8 mL/min (by C-G formula based on SCr of 0.61 mg/dL). Liver Function Tests: Recent Labs  Lab 04/24/24 1113  AST 26  ALT 20  ALKPHOS 57  BILITOT 0.9  PROT 7.1  ALBUMIN 3.4*   No results for input(s): "LIPASE", "AMYLASE" in the last 168 hours. No results for input(s): "AMMONIA" in the last 168 hours. Coagulation Profile: No results for input(s): "INR", "PROTIME" in the last 168 hours. Cardiac Enzymes: No results for input(s): "CKTOTAL", "CKMB", "CKMBINDEX", "TROPONINI" in the last 168 hours. BNP (last 3 results) No results for input(s): "PROBNP" in the last 8760 hours. HbA1C: Recent Labs    04/25/24 0429  HGBA1C 7.0*   CBG: Recent Labs  Lab 04/25/24 1134 04/25/24 1643 04/25/24 2113 04/26/24 0742 04/26/24 1134  GLUCAP 101* 108* 103* 114* 120*   Lipid Profile: No results for input(s): "CHOL",  "HDL", "LDLCALC", "TRIG", "CHOLHDL", "LDLDIRECT" in the last 72 hours. Thyroid  Function Tests: No results for input(s): "TSH", "T4TOTAL", "FREET4", "T3FREE", "THYROIDAB" in the last 72 hours. Anemia Panel: No results for input(s): "VITAMINB12", "FOLATE", "FERRITIN", "TIBC", "IRON", "RETICCTPCT" in the last 72 hours. Sepsis Labs: Recent Labs  Lab 04/24/24 1113 04/24/24 1309 04/26/24 0425  LATICACIDVEN 1.6 2.0* 1.3    Recent Results (from the past 240 hours)  Culture, blood (Routine x 2)     Status: None (Preliminary result)   Collection Time: 04/24/24 10:53 AM   Specimen: BLOOD  Result Value Ref Range Status   Specimen Description BLOOD LEFT ASSIST CONTROL  Final   Special Requests   Final    BOTTLES DRAWN AEROBIC AND ANAEROBIC Blood Culture adequate volume   Culture   Final    NO GROWTH 2 DAYS Performed at [facility], [address] [place], [place] [address]    Report Status PENDING  Incomplete  Resp panel by RT-PCR (RSV, Flu A&B, Covid) Anterior Nasal Swab     Status: None   Collection Time: 04/24/24 11:19 AM   Specimen: Anterior Nasal Swab  Result Value Ref Range Status   SARS Coronavirus 2 by RT PCR NEGATIVE NEGATIVE Final    Comment: (NOTE) SARS-CoV-2 target nucleic acids are NOT DETECTED.  The SARS-CoV-2 RNA is generally detectable in upper respiratory specimens during the acute phase of infection. The lowest concentration of SARS-CoV-2 viral copies this assay can detect is 138 copies/mL. A negative result does not preclude SARS-Cov-2 infection and should not be used as the sole basis for treatment or other patient management decisions. A negative result may occur with  improper specimen collection/handling, submission of specimen other than nasopharyngeal swab, presence of viral mutation(s) within the areas targeted by this assay, and inadequate number of viral copies(<138 copies/mL). A negative result must be combined with clinical observations, patient history,  and epidemiological information. The expected result is Negative.  Fact Sheet for Patients:  BloggerCourse.com  Fact Sheet for Healthcare Providers:  SeriousBroker.it  This test is no t yet approved or cleared by the [place]  FDA and  has been authorized for detection and/or diagnosis of SARS-CoV-2 by FDA under an Emergency Use Authorization (EUA). This EUA will remain  in effect (meaning this test can be used) for the duration of the COVID-19 declaration under Section 564(b)(1)  of the Act, 21 U.S.C.section 360bbb-3(b)(1), unless the authorization is terminated  or revoked sooner.       Influenza A by PCR NEGATIVE NEGATIVE Final   Influenza B by PCR NEGATIVE NEGATIVE Final    Comment: (NOTE) The Xpert Xpress SARS-CoV-2/FLU/RSV plus assay is intended as an aid in the diagnosis of influenza from Nasopharyngeal swab specimens and should not be used as a sole basis for treatment. Nasal washings and aspirates are unacceptable for Xpert Xpress SARS-CoV-2/FLU/RSV testing.  Fact Sheet for Patients: BloggerCourse.com  Fact Sheet for Healthcare Providers: SeriousBroker.it  This test is not yet approved or cleared by the [place]  FDA and has been authorized for detection and/or diagnosis of SARS-CoV-2 by FDA under an Emergency Use Authorization (EUA). This EUA will remain in effect (meaning this test can be used) for the duration of the COVID-19 declaration under Section 564(b)(1) of the Act, 21 U.S.C. section 360bbb-3(b)(1), unless the authorization is terminated or revoked.     Resp Syncytial Virus by PCR NEGATIVE NEGATIVE Final    Comment: (NOTE) Fact Sheet for Patients: BloggerCourse.com  Fact Sheet for Healthcare Providers: SeriousBroker.it  This test is not yet approved or cleared by the [place]  FDA and has  been authorized for detection and/or diagnosis of SARS-CoV-2 by FDA under an Emergency Use Authorization (EUA). This EUA will remain in effect (meaning this test can be used) for the duration of the COVID-19 declaration under Section 564(b)(1) of the Act, 21 U.S.C. section 360bbb-3(b)(1), unless the authorization is terminated or revoked.  Performed at [facility], [address], [place], [place] [address]   Culture, blood (Routine X 2) w Reflex to ID Panel     Status: None (Preliminary result)   Collection Time: 04/24/24 11:40 AM   Specimen: BLOOD  Result Value Ref Range Status   Specimen Description BLOOD BLOOD LEFT WRIST  Final   Special Requests   Final    BOTTLES DRAWN AEROBIC AND ANAEROBIC Blood Culture results may not be optimal due to an inadequate volume of blood received in culture bottles   Culture   Final    NO GROWTH 2 DAYS Performed at [facility], [address] [place], [place] [address]    Report Status PENDING  Incomplete  MRSA Next Gen by PCR, Nasal     Status: None   Collection Time: 04/24/24  5:20 PM   Specimen: Nasal Mucosa; Nasal Swab  Result Value Ref Range Status   MRSA by PCR Next Gen NOT DETECTED NOT DETECTED Final    Comment: (NOTE) The GeneXpert MRSA Assay (FDA approved for NASAL specimens only), is one component of a comprehensive MRSA colonization surveillance program. It is not intended to diagnose MRSA infection nor to guide or monitor treatment for MRSA infections. Test performance is not FDA approved in patients less than 37 years old. Performed at [facility], [address], [place], [place] [address]          Radiology Studies: CT Head Wo Contrast Result Date: 04/24/2024 CLINICAL DATA:  ams EXAM: CT HEAD WITHOUT CONTRAST TECHNIQUE: Contiguous axial images were obtained from the base of the skull through the vertex without intravenous contrast. RADIATION DOSE REDUCTION: This exam was performed according to the departmental  dose-optimization program which includes automated exposure control, adjustment of the mA and/or kV according to patient size and/or use of iterative reconstruction technique. COMPARISON:  CT scan head from 03/14/2024. FINDINGS: Brain: No evidence of acute infarction, hemorrhage, hydrocephalus, extra-axial collection or mass lesion/mass  effect. There is bilateral periventricular hypodensity, which is non-specific but most likely seen in the settings of microvascular ischemic changes. Moderate in extent. 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 Otherwise normal appearance of brain parenchyma. Redemonstration of patient's known sellar/suprasellar mass. No significant interval change. Ventricles are normal. Cerebral volume is age appropriate. Vascular: No hyperdense vessel or unexpected calcification. Intracranial arteriosclerosis. Skull: Normal. Negative for fracture or focal lesion. Sinuses/Orbits: No acute finding. Other: Visualized mastoid air cells are unremarkable. No mastoid effusion. IMPRESSION: *No acute intracranial abnormality. No significant interval change. Electronically Signed   By: Beula Brunswick M.D.   On: 04/24/2024 14:00   CT Chest Wo Contrast Result Date: 04/24/2024 CLINICAL DATA:  cf pna.  Increased altered mental status. EXAM: CT CHEST WITHOUT CONTRAST TECHNIQUE: Multidetector CT imaging of the chest was performed following the standard protocol without IV contrast. RADIATION DOSE REDUCTION: This exam was performed according to the departmental dose-optimization program which includes automated exposure control, adjustment of the mA and/or kV according to patient size and/or use of iterative reconstruction technique. COMPARISON:  CT scan chest from 12/25/2023. FINDINGS: Cardiovascular: Normal cardiac size. No pericardial effusion. No aortic aneurysm. There are coronary artery calcifications, in keeping with coronary artery disease. There are also mild peripheral atherosclerotic  vascular calcifications of thoracic aorta and its major branches. Mediastinum/Nodes: Visualized thyroid  gland appears grossly unremarkable. No solid / cystic mediastinal masses. The esophagus is nondistended precluding optimal assessment. There are few enlarged mediastinal lymph nodes, which appear grossly similar to the prior study and favored benign/reactive in the given clinical setting. No axillary lymphadenopathy by size criteria. Evaluation of bilateral hila is limited due to lack on intravenous contrast: however, no large hilar lymphadenopathy identified. Lungs/Pleura: The central tracheo-bronchial tree is patent. There are heterogeneous solid and ground-glass opacities throughout left lung and patchy opacities in the right lung lower lobe, compatible with multilobar pneumonia. Follow-up to clearing is recommended. There is no associated lung mass, pleural effusion or pneumothorax. No suspicious lung nodule. Upper Abdomen: Surgically absent gallbladder. Stable left adrenal adenomas. Remaining visualized upper abdominal viscera within normal limits. Musculoskeletal: The visualized soft tissues of the chest wall are grossly unremarkable. No suspicious osseous lesions. There are mild to moderate multilevel degenerative changes in the visualized spine. IMPRESSION: 1. There are heterogeneous, solid and ground-glass opacities throughout left lung and patchy opacities in the right lung lower lobe, compatible with multilobar pneumonia. Follow-up to clearing is recommended. 2. Multiple other nonacute observations, as described above. Aortic Atherosclerosis (ICD10-I70.0). Electronically Signed   By: Beula Brunswick M.D.   On: 04/24/2024 13:56        Scheduled Meds:  acetylcysteine   4 mL Nebulization TID   atorvastatin   40 mg Oral Daily   Chlorhexidine  Gluconate Cloth  6 each Topical Daily   clopidogrel   75 mg Oral Daily   cycloSPORINE   1 drop Both Eyes BID   enoxaparin  (LOVENOX ) injection  40 mg  Subcutaneous Q24H   furosemide   40 mg Intravenous Once   insulin  aspart  0-5 Units Subcutaneous QHS   insulin  aspart  0-9 Units Subcutaneous TID WC   ipratropium-albuterol   3 mL Nebulization Q4H   levothyroxine   50 mcg Oral Q0600   mirabegron  ER  25 mg Oral Daily   pregabalin   150 mg Oral BID   venlafaxine  XR  150 mg Oral [id number]   vortioxetine  HBr  10 mg Oral Daily   Continuous Infusions:  azithromycin  Stopped (04/25/24 1338)   cefTRIAXone  (ROCEPHIN )  IV 2 g (04/26/24  1040)     LOS: 2 days    Critical care time spent: 55 minutes    Nafeesah Lapaglia Loran Rock, DO [facility]  If 7PM-7AM, please contact night-coverage [email or web address] 04/26/2024, 12:27 PM

## 2024-04-26 NOTE — Progress Notes (Signed)
 SLP Cancellation Note  Patient Details Name: [name] White MRN: [id number] DOB: September 15, 1941   Cancelled treatment:        SLP planned for MBSS this afternoon, however Pt with decline in respiratory function and Pt given Ativan  and not appropriate for MBSS at this time. Will plan to complete MBSS on Monday am if Pt is appropriate and MBSS still indicated. Thank you,  Atonya Templer H. Vergil Glasser, CCC-SLP Speech Language Pathologist    Florina Husbands 04/26/2024, 1:25 PM

## 2024-04-27 ENCOUNTER — Inpatient Hospital Stay (HOSPITAL_COMMUNITY)

## 2024-04-27 DIAGNOSIS — A419 Sepsis, unspecified organism: Secondary | ICD-10-CM | POA: Diagnosis not present

## 2024-04-27 DIAGNOSIS — I5031 Acute diastolic (congestive) heart failure: Secondary | ICD-10-CM

## 2024-04-27 DIAGNOSIS — R6521 Severe sepsis with septic shock: Secondary | ICD-10-CM | POA: Diagnosis not present

## 2024-04-27 LAB — ECHOCARDIOGRAM COMPLETE
Height: 65 in
MV VTI: 1.15 cm2
S' Lateral: 2.9 cm
Weight: 2007.07 [oz_av]

## 2024-04-27 LAB — BASIC METABOLIC PANEL WITH GFR
Anion gap: 8 (ref 5–15)
BUN: 7 mg/dL — ABNORMAL LOW (ref 8–23)
CO2: 27 mmol/L (ref 22–32)
Calcium: 7.9 mg/dL — ABNORMAL LOW (ref 8.9–10.3)
Chloride: 104 mmol/L (ref 98–111)
Creatinine, Ser: 0.37 mg/dL — ABNORMAL LOW (ref 0.44–1.00)
GFR, Estimated: >60 mL/min (ref >60)
Glucose, Bld: 202 mg/dL — ABNORMAL HIGH (ref 70–99)
Potassium: 3.4 mmol/L — ABNORMAL LOW (ref 3.5–5.1)
Sodium: 139 mmol/L (ref 135–145)

## 2024-04-27 LAB — GLUCOSE, CAPILLARY
Glucose-Capillary: 185 mg/dL — ABNORMAL HIGH (ref 70–99)
Glucose-Capillary: 328 mg/dL — ABNORMAL HIGH (ref 70–99)
Glucose-Capillary: 212 mg/dL — ABNORMAL HIGH (ref 70–99)
Glucose-Capillary: 278 mg/dL — ABNORMAL HIGH (ref 70–99)

## 2024-04-27 LAB — CBC
HCT: 34.5 % — ABNORMAL LOW (ref 36.0–46.0)
Hemoglobin: 11.3 g/dL — ABNORMAL LOW (ref 12.0–15.0)
MCH: 28.7 pg (ref 26.0–34.0)
MCHC: 32.8 g/dL (ref 30.0–36.0)
MCV: 87.6 fL (ref 80.0–100.0)
Platelets: 223 10*3/uL (ref 150–400)
RBC: 3.94 MIL/uL (ref 3.87–5.11)
RDW: 15.2 % (ref 11.5–15.5)
WBC: 7.7 10*3/uL (ref 4.0–10.5)
nRBC: 0 % (ref 0.0–0.2)

## 2024-04-27 LAB — MAGNESIUM: Magnesium: 1.7 mg/dL (ref 1.7–2.4)

## 2024-04-27 MED ORDER — IPRATROPIUM-ALBUTEROL 0.5-2.5 (3) MG/3ML IN SOLN
3.0000 mL | RESPIRATORY_TRACT | Status: DC
  Administered 2024-04-27 (×2): 3 mL via RESPIRATORY_TRACT
  Filled 2024-04-27 (×3): qty 3

## 2024-04-27 MED ORDER — POTASSIUM CHLORIDE CRYS ER 20 MEQ PO TBCR: 40.0000 meq | EXTENDED_RELEASE_TABLET | Freq: Two times a day (BID) | ORAL | Status: DC

## 2024-04-27 MED ORDER — FUROSEMIDE 10 MG/ML IJ SOLN
40.0000 mg | Freq: Once | INTRAMUSCULAR | Status: AC
  Administered 2024-04-27: 40 mg via INTRAVENOUS
  Filled 2024-04-27: qty 4

## 2024-04-27 MED ORDER — POTASSIUM CHLORIDE 20 MEQ PO PACK
40.0000 meq | PACK | Freq: Two times a day (BID) | ORAL | Status: AC
Start: 2024-04-27 — End: 2024-04-27
  Administered 2024-04-27 (×2): 40 meq via ORAL
  Filled 2024-04-27 (×2): qty 2

## 2024-04-27 MED ORDER — IPRATROPIUM-ALBUTEROL 0.5-2.5 (3) MG/3ML IN SOLN
3.0000 mL | Freq: Three times a day (TID) | RESPIRATORY_TRACT | Status: DC
  Administered 2024-04-27 – 2024-05-01 (×11): 3 mL via RESPIRATORY_TRACT
  Filled 2024-04-27 (×11): qty 3

## 2024-04-27 NOTE — Progress Notes (Signed)
 Complaining of neck pain. Tech gave warm pack to place behind neck, stated that it feels much better now.

## 2024-04-27 NOTE — Progress Notes (Signed)
 PROGRESS NOTE    [name] White  NFA:[id number] DOB: July 07, 1941 DOA: 04/24/2024 PCP: Omie Bickers, MD   Brief Narrative:    [name] White is a 83 y.o. female with medical history significant for diabetes, hypertension, stroke, pituitary adenoma, PSVT, anxiety and depression. Patient was brought to the ED from her group facility with reports of confusion that started today, also with cough, fever.   At the time of my evaluation, patient is lethargic, responds to direction but unable to answer questions or give history.  Patient's son [name]) is at bedside.    Recent hospitalization 3/17 to 3/25 for acute/early subacute stroke with concomitant acute metabolic encephalopathy.  Was discharged to SNF.  Patient has been admitted with septic shock secondary to multifocal pneumonia along with associated acute hypoxemic respiratory failure and acute metabolic encephalopathy.  Assessment & Plan:   Principal Problem:   Septic shock (HCC) Active Problems:   Acute metabolic encephalopathy   Multifocal pneumonia   Acute respiratory failure with hypoxia (HCC)   CVA (cerebral vascular accident) (HCC)   Hypothyroidism   Diabetes (HCC)   Essential hypertension   Pressure injury of skin   Anxiety and depression  Assessment and Plan:   Septic shock (HCC)-resolved Septic shock secondary to multifocal pneumonia.  Meeting severe sepsis criteria with fever of 101.1, tachypnea, respiratory rate 16-25, hypotension, blood pressure down to 77/48, now improved on pressors.  Lactic acid 1.6 > 2.  Days of endorgan dysfunction -acute respiratory failure, altered mental status. UA not suggestive of UTI. - Norepinephrine  has currently been weaned off, monitor closely-currently with stable blood pressure readings. - Plan to diurese as noted below due to pulmonary edema related to excessive administration of volume on admission   Acute respiratory failure with hypoxia (HCC)-now improving O2 sats down to  80%, currently on 2 L.  Sats greater than 93%.  ABG showed pH of 7.4, pCO2 of 47.  Likely secondary to multifocal pneumonia. - Improved after use of Lasix  5/2, repeat dose 5/3 -Continue breathing treatments and steroids as prescribed   Multifocal pneumonia Multifocal pneumonia with septic shock and acute hypoxic respiratory failure. CT chest shows multifocal pneumonia.  COVID influenza RSV negative.  MRSA negative. -Continue IV ceftriaxone  and azithromycin  -Follow-up blood cultures with no growth noted - Urine strep Pneumonia negative - Bronchodilators, mucolytics   Acute metabolic encephalopathy-resolved Likely secondary to septic shock, multifocal pneumonia.  Patient confused and lethargic.  Head CT negative for acute abnormality. -Management per above - Resume certain psychoactive medications   CVA (cerebral vascular accident) (HCC) Resume Plavix    Anxiety and depression Resume certain psychoactive medications as noted below   Essential hypertension -Hypotension resolved, hold antihypertensives while on IV diuresis with Lasix  -Plan to resume by 5/4 with anticipated no further need for Lasix    Diabetes (HCC) - HgbA1c 7.0%. - SSI- s - Metformin  discontinued after last hospitalization last month.   Hypothyroidism Resume Synthroid  in a.m.    DVT prophylaxis:Lovenox  Code Status: DNR Family Communication: Discussed with daughters at bedside 5/2 Disposition Plan:  Status is: Inpatient Remains inpatient appropriate because: Need for IV medications.  Skin Assessment:  I have examined the patient's skin and I agree with the wound assessment as performed by the wound care RN as outlined below:  Pressure Injury 04/24/24 Sacrum Mid Stage 1 -  Intact skin with non-blanchable redness of a localized area usually over a bony prominence. (Active)  04/24/24 1724  Location: Sacrum  Location Orientation: Mid  Staging: Stage  1 -  Intact skin with non-blanchable redness of a localized  area usually over a bony prominence.  Wound Description (Comments):   Present on Admission:   Dressing Type Foam - Lift dressing to assess site every shift 04/27/24 0800    Consultants:  None  Procedures:  None  Antimicrobials:  Anti-infectives (From admission, onward)    Start     Dose/Rate Route Frequency Ordered Stop   04/25/24 1600  vancomycin  (VANCOCIN ) IVPB 1000 mg/200 mL premix  Status:  Discontinued        1,000 mg 200 mL/hr over 60 Minutes Intravenous Every 24 hours 04/24/24 1542 04/24/24 1945   04/25/24 1200  azithromycin  (ZITHROMAX ) 500 mg in sodium chloride  0.9 % 250 mL IVPB        500 mg 250 mL/hr over 60 Minutes Intravenous Every 24 hours 04/24/24 2116     04/25/24 1100  cefTRIAXone  (ROCEPHIN ) 2 g in sodium chloride  0.9 % 100 mL IVPB        2 g 200 mL/hr over 30 Minutes Intravenous Every 24 hours 04/24/24 2116     04/24/24 1600  vancomycin  (VANCOREADY) IVPB 1250 mg/250 mL        1,250 mg 166.7 mL/hr over 90 Minutes Intravenous  Once 04/24/24 1541 04/25/24 0600   04/24/24 1530  ceFEPIme  (MAXIPIME ) 2 g in sodium chloride  0.9 % 100 mL IVPB        2 g 200 mL/hr over 30 Minutes Intravenous  Once 04/24/24 1517 04/24/24 1729   04/24/24 1130  cefTRIAXone  (ROCEPHIN ) 2 g in sodium chloride  0.9 % 100 mL IVPB        2 g 200 mL/hr over 30 Minutes Intravenous Once 04/24/24 1119 04/24/24 1225   04/24/24 1130  azithromycin  (ZITHROMAX ) 500 mg in sodium chloride  0.9 % 250 mL IVPB        500 mg 250 mL/hr over 60 Minutes Intravenous  Once 04/24/24 1119 04/24/24 1407       Subjective: Patient seen and evaluated today with overall improvement in respiratory condition with the use of Lasix  yesterday and has been started on Solu-Medrol .  Appears to be weaning on oxygen.  Objective: Vitals:   04/27/24 0700 04/27/24 0746 04/27/24 0800 04/27/24 0907  BP: 133/72  135/85   Pulse: 76  78   Resp: 18  19   Temp:  97.9 F (36.6 C)    TempSrc:  Axillary    SpO2: 97%  (!) 85% 95%   Weight:      Height:        Intake/Output Summary (Last 24 hours) at 04/27/2024 1117 Last data filed at 04/27/2024 1610 Gross per 24 hour  Intake 701.17 ml  Output 1100 ml  Net -398.83 ml   Filed Weights   04/24/24 1045 04/24/24 1715 04/27/24 0500  Weight: 61.3 kg 59.6 kg 56.9 kg    Examination:  General exam: Appears calm and comfortable  Respiratory system: Congestion and wheezing noted bilaterally. Respiratory effort normal.  3 L nasal cannula Cardiovascular system: S1 & S2 heard, RRR.  Gastrointestinal system: Abdomen is soft Central nervous system: Alert and awake Extremities: No edema Skin: No significant lesions noted Psychiatry: Flat affect.    Data Reviewed: I have personally reviewed following labs and imaging studies  CBC: Recent Labs  Lab 04/24/24 1113 04/25/24 0429 04/26/24 0425 04/27/24 0532  WBC 8.1 6.1 11.0* 7.7  NEUTROABS 6.1  --   --   --   HGB 13.7 11.8* 10.6* 11.3*  HCT  41.2 37.0 33.8* 34.5*  MCV 87.8 90.9 90.6 87.6  PLT 260 211 220 223   Basic Metabolic Panel: Recent Labs  Lab 04/24/24 1113 04/25/24 0429 04/26/24 0425 04/27/24 0532  NA 128* 137 140 139  K 3.9 3.5 3.5 3.4*  CL 93* 107 106 104  CO2 24 23 27 27   GLUCOSE 119* 178* 103* 202*  BUN 12 12 9  7*  CREATININE 0.63 0.52 0.61 0.37*  CALCIUM  8.7* 7.6* 7.7* 7.9*  MG  --   --  1.4* 1.7   GFR: Estimated Creatinine Clearance: 48.7 mL/min (A) (by C-G formula based on SCr of 0.37 mg/dL (L)). Liver Function Tests: Recent Labs  Lab 04/24/24 1113  AST 26  ALT 20  ALKPHOS 57  BILITOT 0.9  PROT 7.1  ALBUMIN 3.4*   No results for input(s): "LIPASE", "AMYLASE" in the last 168 hours. No results for input(s): "AMMONIA" in the last 168 hours. Coagulation Profile: No results for input(s): "INR", "PROTIME" in the last 168 hours. Cardiac Enzymes: No results for input(s): "CKTOTAL", "CKMB", "CKMBINDEX", "TROPONINI" in the last 168 hours. BNP (last 3 results) No results for input(s):  "PROBNP" in the last 8760 hours. HbA1C: Recent Labs    04/25/24 0429  HGBA1C 7.0*   CBG: Recent Labs  Lab 04/25/24 2113 04/26/24 0742 04/26/24 1134 04/26/24 1630 04/27/24 0735  GLUCAP 103* 114* 120* 170* 185*   Lipid Profile: No results for input(s): "CHOL", "HDL", "LDLCALC", "TRIG", "CHOLHDL", "LDLDIRECT" in the last 72 hours. Thyroid  Function Tests: No results for input(s): "TSH", "T4TOTAL", "FREET4", "T3FREE", "THYROIDAB" in the last 72 hours. Anemia Panel: No results for input(s): "VITAMINB12", "FOLATE", "FERRITIN", "TIBC", "IRON", "RETICCTPCT" in the last 72 hours. Sepsis Labs: Recent Labs  Lab 04/24/24 1113 04/24/24 1309 04/26/24 0425  LATICACIDVEN 1.6 2.0* 1.3    Recent Results (from the past 240 hours)  Culture, blood (Routine x 2)     Status: None (Preliminary result)   Collection Time: 04/24/24 10:53 AM   Specimen: BLOOD  Result Value Ref Range Status   Specimen Description BLOOD LEFT ASSIST CONTROL  Final   Special Requests   Final    BOTTLES DRAWN AEROBIC AND ANAEROBIC Blood Culture adequate volume   Culture   Final    NO GROWTH 3 DAYS Performed at [facility], [address], [place], [place] [address]    Report Status PENDING  Incomplete  Resp panel by RT-PCR (RSV, Flu A&B, Covid) Anterior Nasal Swab     Status: None   Collection Time: 04/24/24 11:19 AM   Specimen: Anterior Nasal Swab  Result Value Ref Range Status   SARS Coronavirus 2 by RT PCR NEGATIVE NEGATIVE Final    Comment: (NOTE) SARS-CoV-2 target nucleic acids are NOT DETECTED.  The SARS-CoV-2 RNA is generally detectable in upper respiratory specimens during the acute phase of infection. The lowest concentration of SARS-CoV-2 viral copies this assay can detect is 138 copies/mL. A negative result does not preclude SARS-Cov-2 infection and should not be used as the sole basis for treatment or other patient management decisions. A negative result may occur with  improper specimen  collection/handling, submission of specimen other than nasopharyngeal swab, presence of viral mutation(s) within the areas targeted by this assay, and inadequate number of viral copies(<138 copies/mL). A negative result must be combined with clinical observations, patient history, and epidemiological information. The expected result is Negative.  Fact Sheet for Patients:  BloggerCourse.com  Fact Sheet for Healthcare Providers:  SeriousBroker.it  This test is no  t yet approved or cleared by the [place]  FDA and  has been authorized for detection and/or diagnosis of SARS-CoV-2 by FDA under an Emergency Use Authorization (EUA). This EUA will remain  in effect (meaning this test can be used) for the duration of the COVID-19 declaration under Section 564(b)(1) of the Act, 21 U.S.C.section 360bbb-3(b)(1), unless the authorization is terminated  or revoked sooner.       Influenza A by PCR NEGATIVE NEGATIVE Final   Influenza B by PCR NEGATIVE NEGATIVE Final    Comment: (NOTE) The Xpert Xpress SARS-CoV-2/FLU/RSV plus assay is intended as an aid in the diagnosis of influenza from Nasopharyngeal swab specimens and should not be used as a sole basis for treatment. Nasal washings and aspirates are unacceptable for Xpert Xpress SARS-CoV-2/FLU/RSV testing.  Fact Sheet for Patients: BloggerCourse.com  Fact Sheet for Healthcare Providers: SeriousBroker.it  This test is not yet approved or cleared by the [place]  FDA and has been authorized for detection and/or diagnosis of SARS-CoV-2 by FDA under an Emergency Use Authorization (EUA). This EUA will remain in effect (meaning this test can be used) for the duration of the COVID-19 declaration under Section 564(b)(1) of the Act, 21 U.S.C. section 360bbb-3(b)(1), unless the authorization is terminated or revoked.     Resp Syncytial  Virus by PCR NEGATIVE NEGATIVE Final    Comment: (NOTE) Fact Sheet for Patients: BloggerCourse.com  Fact Sheet for Healthcare Providers: SeriousBroker.it  This test is not yet approved or cleared by the [place]  FDA and has been authorized for detection and/or diagnosis of SARS-CoV-2 by FDA under an Emergency Use Authorization (EUA). This EUA will remain in effect (meaning this test can be used) for the duration of the COVID-19 declaration under Section 564(b)(1) of the Act, 21 U.S.C. section 360bbb-3(b)(1), unless the authorization is terminated or revoked.  Performed at [facility], [address], [place], [place] [address]   Culture, blood (Routine X 2) w Reflex to ID Panel     Status: None (Preliminary result)   Collection Time: 04/24/24 11:40 AM   Specimen: BLOOD  Result Value Ref Range Status   Specimen Description BLOOD BLOOD LEFT WRIST  Final   Special Requests   Final    BOTTLES DRAWN AEROBIC AND ANAEROBIC Blood Culture results may not be optimal due to an inadequate volume of blood received in culture bottles   Culture   Final    NO GROWTH 3 DAYS Performed at [facility], [address] [place], [place] [address]    Report Status PENDING  Incomplete  MRSA Next Gen by PCR, Nasal     Status: None   Collection Time: 04/24/24  5:20 PM   Specimen: Nasal Mucosa; Nasal Swab  Result Value Ref Range Status   MRSA by PCR Next Gen NOT DETECTED NOT DETECTED Final    Comment: (NOTE) The GeneXpert MRSA Assay (FDA approved for NASAL specimens only), is one component of a comprehensive MRSA colonization surveillance program. It is not intended to diagnose MRSA infection nor to guide or monitor treatment for MRSA infections. Test performance is not FDA approved in patients less than 10 years old. Performed at [facility], [address], [place], [place] [address]          Radiology Studies: DG CHEST PORT 1  VIEW Result Date: 04/26/2024 CLINICAL DATA:  Hypoxemia. EXAM: PORTABLE CHEST 1 VIEW COMPARISON:  Chest radiograph dated 04/24/2024. FINDINGS: Diffuse interstitial and interlobular septal prominence and edema. Small bilateral pleural effusions. No  pneumothorax. The cardiac silhouette is within limits. Atherosclerotic calcification of the aorta. No acute osseous pathology. IMPRESSION: Pulmonary edema with small bilateral pleural effusions. Electronically Signed   By: Angus Bark M.D.   On: 04/26/2024 13:25        Scheduled Meds:  acetylcysteine   4 mL Nebulization TID   atorvastatin   40 mg Oral Daily   Chlorhexidine  Gluconate Cloth  6 each Topical Daily   clopidogrel   75 mg Oral Daily   cycloSPORINE   1 drop Both Eyes BID   enoxaparin  (LOVENOX ) injection  40 mg Subcutaneous Q24H   feeding supplement  237 mL Oral BID BM   insulin  aspart  0-5 Units Subcutaneous QHS   insulin  aspart  0-9 Units Subcutaneous TID WC   ipratropium-albuterol   3 mL Nebulization Q4H WA   levothyroxine   50 mcg Oral Q0600   methylPREDNISolone  (SOLU-MEDROL ) injection  40 mg Intravenous Q12H   mirabegron  ER  25 mg Oral Daily   potassium chloride   40 mEq Oral BID   pregabalin   150 mg Oral BID   venlafaxine  XR  150 mg Oral [id number]   vortioxetine  HBr  10 mg Oral Daily   Continuous Infusions:  azithromycin  Stopped (04/26/24 1350)   cefTRIAXone  (ROCEPHIN )  IV 2 g (04/27/24 0947)     LOS: 3 days    Total care time spent: 55 minutes    Jarell Mcewen Loran Rock, DO [facility]  If 7PM-7AM, please contact night-coverage [email or web address] 04/27/2024, 11:17 AM

## 2024-04-28 DIAGNOSIS — F32A Depression, unspecified: Secondary | ICD-10-CM

## 2024-04-28 DIAGNOSIS — F419 Anxiety disorder, unspecified: Secondary | ICD-10-CM

## 2024-04-28 DIAGNOSIS — I10 Essential (primary) hypertension: Secondary | ICD-10-CM | POA: Diagnosis not present

## 2024-04-28 DIAGNOSIS — J9601 Acute respiratory failure with hypoxia: Secondary | ICD-10-CM | POA: Diagnosis not present

## 2024-04-28 DIAGNOSIS — A419 Sepsis, unspecified organism: Secondary | ICD-10-CM | POA: Diagnosis not present

## 2024-04-28 DIAGNOSIS — L8991 Pressure ulcer of unspecified site, stage 1: Secondary | ICD-10-CM

## 2024-04-28 LAB — BASIC METABOLIC PANEL WITH GFR
Anion gap: 8 (ref 5–15)
BUN: 10 mg/dL (ref 8–23)
CO2: 29 mmol/L (ref 22–32)
Calcium: 8.4 mg/dL — ABNORMAL LOW (ref 8.9–10.3)
Chloride: 101 mmol/L (ref 98–111)
Creatinine, Ser: 0.52 mg/dL (ref 0.44–1.00)
GFR, Estimated: >60 mL/min (ref >60)
Glucose, Bld: 186 mg/dL — ABNORMAL HIGH (ref 70–99)
Potassium: 4.2 mmol/L (ref 3.5–5.1)
Sodium: 138 mmol/L (ref 135–145)

## 2024-04-28 LAB — CBC
HCT: 36 % (ref 36.0–46.0)
Hemoglobin: 11.7 g/dL — ABNORMAL LOW (ref 12.0–15.0)
MCH: 28.6 pg (ref 26.0–34.0)
MCHC: 32.5 g/dL (ref 30.0–36.0)
MCV: 88 fL (ref 80.0–100.0)
Platelets: 270 10*3/uL (ref 150–400)
RBC: 4.09 MIL/uL (ref 3.87–5.11)
RDW: 15.2 % (ref 11.5–15.5)
WBC: 12.5 10*3/uL — ABNORMAL HIGH (ref 4.0–10.5)
nRBC: 0 % (ref 0.0–0.2)

## 2024-04-28 LAB — GLUCOSE, CAPILLARY
Glucose-Capillary: 195 mg/dL — ABNORMAL HIGH (ref 70–99)
Glucose-Capillary: 249 mg/dL — ABNORMAL HIGH (ref 70–99)
Glucose-Capillary: 264 mg/dL — ABNORMAL HIGH (ref 70–99)
Glucose-Capillary: 218 mg/dL — ABNORMAL HIGH (ref 70–99)

## 2024-04-28 LAB — MAGNESIUM: Magnesium: 1.7 mg/dL (ref 1.7–2.4)

## 2024-04-28 MED ORDER — FUROSEMIDE 10 MG/ML IJ SOLN
20.0000 mg | Freq: Two times a day (BID) | INTRAMUSCULAR | Status: AC
  Administered 2024-04-28 – 2024-04-29 (×3): 20 mg via INTRAVENOUS
  Filled 2024-04-28 (×3): qty 2

## 2024-04-28 MED ORDER — POTASSIUM CHLORIDE CRYS ER 20 MEQ PO TBCR
40.0000 meq | EXTENDED_RELEASE_TABLET | Freq: Once | ORAL | Status: AC
  Administered 2024-04-28: 40 meq via ORAL
  Filled 2024-04-28: qty 2

## 2024-04-28 MED ORDER — ARFORMOTEROL TARTRATE 15 MCG/2ML IN NEBU
15.0000 ug | INHALATION_SOLUTION | Freq: Two times a day (BID) | RESPIRATORY_TRACT | Status: DC
  Administered 2024-04-28 – 2024-05-01 (×6): 15 ug via RESPIRATORY_TRACT
  Filled 2024-04-28 (×6): qty 2

## 2024-04-28 MED ORDER — BUDESONIDE 0.5 MG/2ML IN SUSP
0.5000 mg | Freq: Two times a day (BID) | RESPIRATORY_TRACT | Status: DC
  Administered 2024-04-28 – 2024-05-01 (×6): 0.5 mg via RESPIRATORY_TRACT
  Filled 2024-04-28 (×6): qty 2

## 2024-04-28 NOTE — Progress Notes (Signed)
 PROGRESS NOTE    [name]  HQI:[id number] DOB: May 07, 1941 DOA: 04/24/2024 PCP: Omie Bickers, MD   Brief Narrative:    [name] White is a 83 y.o. female with medical history significant for diabetes, hypertension, stroke, pituitary adenoma, PSVT, anxiety and depression. Patient was brought to the ED from her group facility with reports of confusion that started today, also with cough, fever.   At the time of my evaluation, patient is lethargic, responds to direction but unable to answer questions or give history.  Patient's son [name]) is at bedside.    Recent hospitalization 3/17 to 3/25 for acute/early subacute stroke with concomitant acute metabolic encephalopathy.  Was discharged to SNF.  Patient has been admitted with septic shock secondary to multifocal pneumonia along with associated acute hypoxemic respiratory failure and acute metabolic encephalopathy.  Assessment & Plan:   Principal Problem:   Septic shock (HCC) Active Problems:   Acute metabolic encephalopathy   Multifocal pneumonia   Acute respiratory failure with hypoxia (HCC)   CVA (cerebral vascular accident) (HCC)   Hypothyroidism   Diabetes (HCC)   Essential hypertension   Pressure injury of skin   Anxiety and depression  Assessment and Plan:  Septic shock (HCC)-resolved Septic shock secondary to multifocal pneumonia.  Meeting severe sepsis criteria with fever of 101.1, tachypnea, respiratory rate 16-25, hypotension, blood pressure down to 77/48, now improved on pressors.  Lactic acid 1.6 > 2.  Days of endorgan dysfunction -acute respiratory failure, altered mental status. UA not suggestive of UTI. - Vital signs has remained stable after Levophed  peripherally has been discontinued. - Will continue gentle IV diuresis and continue to wean off oxygen supplementation - Continue current IV antibiotics and follow clinical response.   Acute respiratory failure with hypoxia (HCC)-now improving O2 sats  down to 80%, currently on 2-3 L.  Sats greater than 93%.  ABG showed pH of 7.4, pCO2 of 47.  Likely secondary to multifocal pneumonia, vascular congestion from fluid resuscitation and COPD exacerbation.. - Continue IV diuresis, follow daily weights and low-sodium diet -Will continue steroids, bronchodilator management, Pulmicort/Brovana - Wean off oxygen supplementation as tolerated.   Multifocal pneumonia Multifocal pneumonia with septic shock and acute hypoxic respiratory failure. CT chest shows multifocal pneumonia.  COVID influenza RSV negative.  MRSA negative. -Continue IV ceftriaxone  and azithromycin  - Urine strep pneumo negative - Blood cultures so far without any growth - Continue supportive care and follow clinical response.   Acute metabolic encephalopathy-resolved Likely secondary to septic shock, multifocal pneumonia.  Patient confused and lethargic.  Head CT negative for acute abnormality. -Management per above - Continue resuming psychoactive medications as per prior to admission regimen. - Constant reorientation, supportive care will be provided.   CVA (cerebral vascular accident) (HCC) -Continue Plavix  for secondary prevention - No acute neurologic deficit appreciated.   Anxiety and depression -Continue resuming psychoactive medications as per home regimen - Patient following commands appropriately and oriented x 3.   Essential hypertension -Hypotension resolved, continue to hold antihypertensives while on IV diuresis with Lasix  - Follow vital signs.   Diabetes (HCC) - HgbA1c 7.0%. - Continue sliding scale insulin  - Follow CBGs fluctuation.   Hypothyroidism -Continue Synthroid .    DVT prophylaxis:Lovenox  Code Status: DNR Family Communication: No family at bedside on today's examination. Disposition Plan:  Status is: Inpatient Remains inpatient appropriate because: Need for IV medications.  Skin Assessment:  I have examined the patient's skin and I agree  with the wound assessment as performed by  the wound care RN as outlined below:  Pressure Injury 04/24/24 Sacrum Mid Stage 1 -  Intact skin with non-blanchable redness of a localized area usually over a bony prominence. (Active)  04/24/24 1724  Location: Sacrum  Location Orientation: Mid  Staging: Stage 1 -  Intact skin with non-blanchable redness of a localized area usually over a bony prominence.  Wound Description (Comments):   Present on Admission:   Dressing Type Foam - Lift dressing to assess site every shift 04/28/24 0800    Consultants:  None  Procedures:  None  Antimicrobials:  Anti-infectives (From admission, onward)    Start     Dose/Rate Route Frequency Ordered Stop   04/25/24 1600  vancomycin  (VANCOCIN ) IVPB 1000 mg/200 mL premix  Status:  Discontinued        1,000 mg 200 mL/hr over 60 Minutes Intravenous Every 24 hours 04/24/24 1542 04/24/24 1945   04/25/24 1200  azithromycin  (ZITHROMAX ) 500 mg in sodium chloride  0.9 % 250 mL IVPB        500 mg 250 mL/hr over 60 Minutes Intravenous Every 24 hours 04/24/24 2116     04/25/24 1100  cefTRIAXone  (ROCEPHIN ) 2 g in sodium chloride  0.9 % 100 mL IVPB        2 g 200 mL/hr over 30 Minutes Intravenous Every 24 hours 04/24/24 2116     04/24/24 1600  vancomycin  (VANCOREADY) IVPB 1250 mg/250 mL        1,250 mg 166.7 mL/hr over 90 Minutes Intravenous  Once 04/24/24 1541 04/25/24 0600   04/24/24 1530  ceFEPIme  (MAXIPIME ) 2 g in sodium chloride  0.9 % 100 mL IVPB        2 g 200 mL/hr over 30 Minutes Intravenous  Once 04/24/24 1517 04/24/24 1729   04/24/24 1130  cefTRIAXone  (ROCEPHIN ) 2 g in sodium chloride  0.9 % 100 mL IVPB        2 g 200 mL/hr over 30 Minutes Intravenous Once 04/24/24 1119 04/24/24 1225   04/24/24 1130  azithromycin  (ZITHROMAX ) 500 mg in sodium chloride  0.9 % 250 mL IVPB        500 mg 250 mL/hr over 60 Minutes Intravenous  Once 04/24/24 1119 04/24/24 1407       Subjective: Afebrile, no chest pain, no  nausea, no vomiting.  Still short winded with minimal exertion and requiring oxygen supplementation.  Patient reporting mild orthopnea.  Objective: Vitals:   04/28/24 0748 04/28/24 0800 04/28/24 0851 04/28/24 0900  BP:  124/70  125/72  Pulse:      Resp:  16  17  Temp: 98.3 F (36.8 C)     TempSrc: Axillary     SpO2:   97%   Weight:      Height:        Intake/Output Summary (Last 24 hours) at 04/28/2024 1059 Last data filed at 04/27/2024 2100 Gross per 24 hour  Intake 340 ml  Output 1400 ml  Net -1060 ml   Filed Weights   04/24/24 1045 04/24/24 1715 04/27/24 0500  Weight: 61.3 kg 59.6 kg 56.9 kg    Examination: General exam: Alert, awake, oriented x 3; following commands appropriate.  In no acute distress. Respiratory system: Short winded with activity; decreased breath sounds appreciated at the bases.  3 L nasal cannula supplementation in place.  Short winded with activity.  Positive expiratory wheezing bilaterally Cardiovascular system:RRR. No rubs or gallops; no JVD on exam. Gastrointestinal system: Abdomen is nondistended, soft and nontender. No organomegaly or masses felt. Normal  bowel sounds heard. Central nervous system:No focal neurological deficits. Extremities: No cyanosis or clubbing. Skin: No petechiae; stage I pressure injury present at time of admission without signs of superimposed infection. Psychiatry: Judgement and insight appear normal.  Flat affect on exam.  Data Reviewed: I have personally reviewed following labs and imaging studies  CBC: Recent Labs  Lab 04/24/24 1113 04/25/24 0429 04/26/24 0425 04/27/24 0532 04/28/24 0425  WBC 8.1 6.1 11.0* 7.7 12.5*  NEUTROABS 6.1  --   --   --   --   HGB 13.7 11.8* 10.6* 11.3* 11.7*  HCT 41.2 37.0 33.8* 34.5* 36.0  MCV 87.8 90.9 90.6 87.6 88.0  PLT 260 211 220 223 270   Basic Metabolic Panel: Recent Labs  Lab 04/24/24 1113 04/25/24 0429 04/26/24 0425 04/27/24 0532 04/28/24 0425  NA 128* 137 140 139  138  K 3.9 3.5 3.5 3.4* 4.2  CL 93* 107 106 104 101  CO2 24 23 27 27 29   GLUCOSE 119* 178* 103* 202* 186*  BUN 12 12 9  7* 10  CREATININE 0.63 0.52 0.61 0.37* 0.52  CALCIUM  8.7* 7.6* 7.7* 7.9* 8.4*  MG  --   --  1.4* 1.7 1.7   GFR: Estimated Creatinine Clearance: 48.7 mL/min (by C-G formula based on SCr of 0.52 mg/dL).  Liver Function Tests: Recent Labs  Lab 04/24/24 1113  AST 26  ALT 20  ALKPHOS 57  BILITOT 0.9  PROT 7.1  ALBUMIN 3.4*   CBG: Recent Labs  Lab 04/27/24 0735 04/27/24 1134 04/27/24 1601 04/27/24 2139 04/28/24 0750  GLUCAP 185* 328* 212* 278* 195*    Sepsis Labs: Recent Labs  Lab 04/24/24 1113 04/24/24 1309 04/26/24 0425  LATICACIDVEN 1.6 2.0* 1.3    Recent Results (from the past 240 hours)  Culture, blood (Routine x 2)     Status: None (Preliminary result)   Collection Time: 04/24/24 10:53 AM   Specimen: BLOOD  Result Value Ref Range Status   Specimen Description BLOOD LEFT ASSIST CONTROL  Final   Special Requests   Final    BOTTLES DRAWN AEROBIC AND ANAEROBIC Blood Culture adequate volume   Culture   Final    NO GROWTH 4 DAYS Performed at [facility], [address], [place], [place] [address]    Report Status PENDING  Incomplete  Resp panel by RT-PCR (RSV, Flu A&B, Covid) Anterior Nasal Swab     Status: None   Collection Time: 04/24/24 11:19 AM   Specimen: Anterior Nasal Swab  Result Value Ref Range Status   SARS Coronavirus 2 by RT PCR NEGATIVE NEGATIVE Final    Comment: (NOTE) SARS-CoV-2 target nucleic acids are NOT DETECTED.  The SARS-CoV-2 RNA is generally detectable in upper respiratory specimens during the acute phase of infection. The lowest concentration of SARS-CoV-2 viral copies this assay can detect is 138 copies/mL. A negative result does not preclude SARS-Cov-2 infection and should not be used as the sole basis for treatment or other patient management decisions. A negative result may occur with  improper specimen  collection/handling, submission of specimen other than nasopharyngeal swab, presence of viral mutation(s) within the areas targeted by this assay, and inadequate number of viral copies(<138 copies/mL). A negative result must be combined with clinical observations, patient history, and epidemiological information. The expected result is Negative.  Fact Sheet for Patients:  BloggerCourse.com  Fact Sheet for Healthcare Providers:  SeriousBroker.it  This test is no t yet approved or cleared by the [place]  FDA and  has been authorized for detection and/or diagnosis of SARS-CoV-2 by FDA under an Emergency Use Authorization (EUA). This EUA will remain  in effect (meaning this test can be used) for the duration of the COVID-19 declaration under Section 564(b)(1) of the Act, 21 U.S.C.section 360bbb-3(b)(1), unless the authorization is terminated  or revoked sooner.       Influenza A by PCR NEGATIVE NEGATIVE Final   Influenza B by PCR NEGATIVE NEGATIVE Final    Comment: (NOTE) The Xpert Xpress SARS-CoV-2/FLU/RSV plus assay is intended as an aid in the diagnosis of influenza from Nasopharyngeal swab specimens and should not be used as a sole basis for treatment. Nasal washings and aspirates are unacceptable for Xpert Xpress SARS-CoV-2/FLU/RSV testing.  Fact Sheet for Patients: BloggerCourse.com  Fact Sheet for Healthcare Providers: SeriousBroker.it  This test is not yet approved or cleared by the [place]  FDA and has been authorized for detection and/or diagnosis of SARS-CoV-2 by FDA under an Emergency Use Authorization (EUA). This EUA will remain in effect (meaning this test can be used) for the duration of the COVID-19 declaration under Section 564(b)(1) of the Act, 21 U.S.C. section 360bbb-3(b)(1), unless the authorization is terminated or revoked.     Resp Syncytial  Virus by PCR NEGATIVE NEGATIVE Final    Comment: (NOTE) Fact Sheet for Patients: BloggerCourse.com  Fact Sheet for Healthcare Providers: SeriousBroker.it  This test is not yet approved or cleared by the [place]  FDA and has been authorized for detection and/or diagnosis of SARS-CoV-2 by FDA under an Emergency Use Authorization (EUA). This EUA will remain in effect (meaning this test can be used) for the duration of the COVID-19 declaration under Section 564(b)(1) of the Act, 21 U.S.C. section 360bbb-3(b)(1), unless the authorization is terminated or revoked.  Performed at [facility], [address] [place], [place] [address]   Culture, blood (Routine X 2) w Reflex to ID Panel     Status: None (Preliminary result)   Collection Time: 04/24/24 11:40 AM   Specimen: BLOOD  Result Value Ref Range Status   Specimen Description BLOOD BLOOD LEFT WRIST  Final   Special Requests   Final    BOTTLES DRAWN AEROBIC AND ANAEROBIC Blood Culture results may not be optimal due to an inadequate volume of blood received in culture bottles   Culture   Final    NO GROWTH 4 DAYS Performed at [facility], [address], [place], [place] [address]    Report Status PENDING  Incomplete  MRSA Next Gen by PCR, Nasal     Status: None   Collection Time: 04/24/24  5:20 PM   Specimen: Nasal Mucosa; Nasal Swab  Result Value Ref Range Status   MRSA by PCR Next Gen NOT DETECTED NOT DETECTED Final    Comment: (NOTE) The GeneXpert MRSA Assay (FDA approved for NASAL specimens only), is one component of a comprehensive MRSA colonization surveillance program. It is not intended to diagnose MRSA infection nor to guide or monitor treatment for MRSA infections. Test performance is not FDA approved in patients less than 2 years old. Performed at [facility], [address] [place], [place] [address]          Radiology Studies: ECHOCARDIOGRAM  COMPLETE Result Date: 04/27/2024    ECHOCARDIOGRAM REPORT   Patient Name:   TARAOLUWA ENTZ Date of Exam: 04/27/2024 Medical Rec #:  [id number]      Height:       65.0 in Accession #:    [id number]  Weight:       125.4 lb Date of Birth:  [birth year]       BSA:          1.622 m Patient Age:    82 years       BP:           91/54 mmHg Patient Gender: F              HR:           99 bpm. Exam Location:  [facility] Procedure: 2D Echo, Color Doppler and Cardiac Doppler (Both Spectral and Color            Flow Doppler were utilized during procedure). Indications:    I50.31 Acute diastolic (congestive) heart failure  History:        Patient has prior history of Echocardiogram examinations, most                 recent 03/13/2024. Risk Factors:Hypertension, Diabetes and                 Dyslipidemia.  Sonographer:    Sherline Distel Senior RDCS Referring Phys: [id number] PRATIK D [facility] IMPRESSIONS  1. Left ventricular ejection fraction, by estimation, is 60 to 65%. The left ventricle has normal function. The left ventricle has no regional wall motion abnormalities. Left ventricular diastolic parameters are indeterminate.  2. Right ventricular systolic function is normal. The right ventricular size is normal.  3. Left atrial size was mildly dilated.  4. MVA by PT1/2 1.23 cm2 with men gradient 12 peak 19 mmHg at HR of 101 bpm. The mitral valve is abnormal. Mild mitral valve regurgitation. Moderate mitral stenosis. Moderate mitral annular calcification.  5. The aortic valve is tricuspid. There is mild calcification of the aortic valve. There is mild thickening of the aortic valve. Aortic valve regurgitation is trivial. Aortic valve sclerosis is present, with no evidence of aortic valve stenosis.  6. The inferior vena cava is normal in size with greater than 50% respiratory variability, suggesting right atrial pressure of 3 mmHg. FINDINGS  Left Ventricle: Left ventricular ejection fraction, by estimation, is 60 to 65%. The left ventricle has normal  function. The left ventricle has no regional wall motion abnormalities. Strain was performed and the global longitudinal strain is indeterminate. The left ventricular internal cavity size was normal in size. There is no left ventricular hypertrophy. Left ventricular diastolic parameters are indeterminate. Right Ventricle: The right ventricular size is normal. No increase in right ventricular wall thickness. Right ventricular systolic function is normal. Left Atrium: Left atrial size was mildly dilated. Right Atrium: Right atrial size was normal in size. Pericardium: There is no evidence of pericardial effusion. Mitral Valve: MVA by PT1/2 1.23 cm2 with men gradient 12 peak 19 mmHg at HR of 101 bpm. The mitral valve is abnormal. There is moderate thickening of the mitral valve leaflet(s). There is moderate calcification of the mitral valve leaflet(s). Moderate mitral annular calcification. Mild mitral valve regurgitation. Moderate mitral valve stenosis. MV peak gradient, 19.1 mmHg. The mean mitral valve gradient is 12.7 mmHg. Tricuspid Valve: The tricuspid valve is normal in structure. Tricuspid valve regurgitation is trivial. No evidence of tricuspid stenosis. Aortic Valve: The aortic valve is tricuspid. There is mild calcification of the aortic valve. There is mild thickening of the aortic valve. Aortic valve regurgitation is trivial. Aortic valve sclerosis is present, with no evidence of aortic valve stenosis. Pulmonic Valve: The pulmonic valve was normal in structure. Pulmonic valve  regurgitation is not visualized. No evidence of pulmonic stenosis. Aorta: The aortic root is normal in size and structure. Venous: The inferior vena cava is normal in size with greater than 50% respiratory variability, suggesting right atrial pressure of 3 mmHg. IAS/Shunts: No atrial level shunt detected by color flow Doppler. Additional Comments: 3D was performed not requiring image post processing on an independent workstation and  was indeterminate.  LEFT VENTRICLE PLAX 2D LVIDd:         3.70 cm LVIDs:         2.90 cm LV PW:         0.70 cm LV IVS:        0.80 cm LVOT diam:     1.80 cm LV SV:         55 LV SV Index:   34 LVOT Area:     2.54 cm  RIGHT VENTRICLE RV S prime:     13.80 cm/s TAPSE (M-mode): 1.8 cm LEFT ATRIUM             Index        RIGHT ATRIUM           Index LA diam:        3.80 cm 2.34 cm/m   RA Area:     13.30 cm LA Vol (A2C):   60.3 ml 37.17 ml/m  RA Volume:   28.90 ml  17.81 ml/m LA Vol (A4C):   45.0 ml 27.74 ml/m LA Biplane Vol: 55.6 ml 34.27 ml/m  AORTIC VALVE LVOT Vmax:   115.00 cm/s LVOT Vmean:  76.200 cm/s LVOT VTI:    0.216 m  AORTA Ao Root diam: 3.00 cm Ao Asc diam:  3.20 cm MITRAL VALVE MV Area VTI:  1.15 cm    SHUNTS MV Peak grad: 19.1 mmHg   Systemic VTI:  0.22 m MV Mean grad: 12.7 mmHg   Systemic Diam: 1.80 cm MV Vmax:      2.18 m/s MV Vmean:     172.3 cm/s Janelle Mediate MD Electronically signed by Janelle Mediate MD Signature Date/Time: 04/27/2024/2:17:05 PM    Final         Scheduled Meds:  acetylcysteine   4 mL Nebulization TID   atorvastatin   40 mg Oral Daily   Chlorhexidine  Gluconate Cloth  6 each Topical Daily   clopidogrel   75 mg Oral Daily   cycloSPORINE   1 drop Both Eyes BID   enoxaparin  (LOVENOX ) injection  40 mg Subcutaneous Q24H   feeding supplement  237 mL Oral BID BM   furosemide   20 mg Intravenous Q12H   insulin  aspart  0-5 Units Subcutaneous QHS   insulin  aspart  0-9 Units Subcutaneous TID WC   ipratropium-albuterol   3 mL Nebulization TID   levothyroxine   50 mcg Oral Q0600   methylPREDNISolone  (SOLU-MEDROL ) injection  40 mg Intravenous Q12H   mirabegron  ER  25 mg Oral Daily   pregabalin   150 mg Oral BID   venlafaxine  XR  150 mg Oral BH-q7a   vortioxetine  HBr  10 mg Oral Daily   Continuous Infusions:  azithromycin  500 mg (04/27/24 1150)   cefTRIAXone  (ROCEPHIN )  IV 2 g (04/28/24 1021)     LOS: 4 days    Total care time spent: 55 minutes    Justina Oman,  MD [facility]  If 7PM-7AM, please contact night-coverage [email or web address] 04/28/2024, 10:59 AM

## 2024-04-29 ENCOUNTER — Inpatient Hospital Stay (HOSPITAL_COMMUNITY)

## 2024-04-29 DIAGNOSIS — A419 Sepsis, unspecified organism: Secondary | ICD-10-CM | POA: Diagnosis not present

## 2024-04-29 DIAGNOSIS — R6521 Severe sepsis with septic shock: Secondary | ICD-10-CM | POA: Diagnosis not present

## 2024-04-29 LAB — CULTURE, BLOOD (ROUTINE X 2)
Culture: NO GROWTH
Culture: NO GROWTH
Special Requests: ADEQUATE

## 2024-04-29 LAB — BASIC METABOLIC PANEL WITH GFR
Anion gap: 8 (ref 5–15)
BUN: 15 mg/dL (ref 8–23)
CO2: 29 mmol/L (ref 22–32)
Calcium: 8.4 mg/dL — ABNORMAL LOW (ref 8.9–10.3)
Chloride: 98 mmol/L (ref 98–111)
Creatinine, Ser: 0.49 mg/dL (ref 0.44–1.00)
GFR, Estimated: >60 mL/min (ref >60)
Glucose, Bld: 239 mg/dL — ABNORMAL HIGH (ref 70–99)
Potassium: 4.2 mmol/L (ref 3.5–5.1)
Sodium: 135 mmol/L (ref 135–145)

## 2024-04-29 LAB — LEGIONELLA PNEUMOPHILA SEROGP 1 UR AG: L. pneumophila Serogp 1 Ur Ag: NEGATIVE

## 2024-04-29 LAB — MAGNESIUM: Magnesium: 1.7 mg/dL (ref 1.7–2.4)

## 2024-04-29 LAB — GLUCOSE, CAPILLARY
Glucose-Capillary: 213 mg/dL — ABNORMAL HIGH (ref 70–99)
Glucose-Capillary: 278 mg/dL — ABNORMAL HIGH (ref 70–99)
Glucose-Capillary: 129 mg/dL — ABNORMAL HIGH (ref 70–99)
Glucose-Capillary: 348 mg/dL — ABNORMAL HIGH (ref 70–99)

## 2024-04-29 MED ORDER — INSULIN ASPART 100 UNIT/ML IJ SOLN
3.0000 [IU] | Freq: Three times a day (TID) | INTRAMUSCULAR | Status: DC
  Administered 2024-04-29 – 2024-04-30 (×4): 3 [IU] via SUBCUTANEOUS

## 2024-04-29 MED ORDER — FUROSEMIDE 10 MG/ML IJ SOLN
40.0000 mg | Freq: Two times a day (BID) | INTRAMUSCULAR | Status: DC
  Administered 2024-04-29: 40 mg via INTRAVENOUS
  Filled 2024-04-29 (×2): qty 4

## 2024-04-29 NOTE — Evaluation (Addendum)
 Physical Therapy Evaluation Patient Details Name: [name] White MRN: [id number] DOB: September 30, 1941 Today's Date: 04/29/2024  History of Present Illness  [name] White is a 83 y.o. female with medical history significant for diabetes, hypertension, stroke, pituitary adenoma, PSVT, anxiety and depression.  Patient was brought to the ED from her group facility with reports of confusion that started today, also with cough, fever.   At the time of my evaluation, patient is lethargic, responds to direction but unable to answer questions or give history.  Patient's son [name]) is at bedside.   Clinical Impression  Patient tolerated session well. During session, patient is alert and oriented to self. Patient reports at most recent baseline at ALF, she requires some assistance for ADLs, RW or w/c for mobility with supervision at least.  Patient is received supine in bed. She requires min-mod A for bed mobility with HOB elevated, min-mod A for STS, and bed>chair transfer with RW due to general weakness. Verbal cueing for proper use of RW t/o. Fair/good seated balance observed with pt able to donn L sock but unable to perform R d/t fatigue. Pt on 3 Lpm [place] t/o session. During mobility, SpO2 drops to 86%. Education needed on pursed lip breathing to address. HR reaching 106 as well. Patient will benefit from continued skilled physical therapy acutely and in recommended venue in order to address overall strength and endurance to improve function and QOL.        If plan is discharge home, recommend the following: A lot of help with walking and/or transfers;A little help with bathing/dressing/bathroom;Assistance with cooking/housework   Can travel by private vehicle        Equipment Recommendations None recommended by PT  Recommendations for Other Services       Functional Status Assessment Patient has had a recent decline in their functional status and demonstrates the ability to make significant  improvements in function in a reasonable and predictable amount of time.     Precautions / Restrictions Precautions Precautions: Fall Restrictions Weight Bearing Restrictions Per Provider Order: No      Mobility  Bed Mobility Overal bed mobility: Needs Assistance Bed Mobility: Supine to Sit     Supine to sit: Min assist     General bed mobility comments: Pt initiates transfer but A needed to complete to EOB d/t weakness. HOB elevated    Transfers Overall transfer level: Needs assistance Equipment used: Rolling walker (2 wheels) Transfers: Sit to/from Stand, Bed to chair/wheelchair/BSC Sit to Stand: Min assist, Mod assist   Step pivot transfers: Min assist, Mod assist       General transfer comment: Min-mod A for all transfers 2/2 general weakness. Pt demo forward lean and inc distance with use of RW, v cues needed for safety.    Ambulation/Gait Ambulation/Gait assistance: Min assist Gait Distance (Feet): 2 Feet Assistive device: Rolling walker (2 wheels) Gait Pattern/deviations: Decreased step length - right, Decreased step length - left, Decreased stride length, Knee flexed in stance - right, Knee flexed in stance - left, Trunk flexed Gait velocity: Dec     General Gait Details: Very dec distance 2/2 fatigue. Pt with SOB, SpO2 droppping below 86% during bed mobility and ambulation on 3 Lpm. v cues needed for pursed lip breathing.  Stairs            Wheelchair Mobility     Tilt Bed    Modified Rankin (Stroke Patients Only)       Balance Overall balance assessment:  Needs assistance Sitting-balance support: No upper extremity supported, Feet supported Sitting balance-Leahy Scale: Fair Sitting balance - Comments: seated EOB. pt needing min A while donning socks seated EOB   Standing balance support: Bilateral upper extremity supported, During functional activity, Reliant on assistive device for balance Standing balance-Leahy Scale: Fair Standing  balance comment: w/ RW during transfer. min A and v cueing               Pertinent Vitals/Pain Pain Assessment Pain Assessment: No/denies pain    Home Living Family/patient expects to be discharged to:: Assisted living         Home Equipment: Rolling Walker (2 wheels);Rollator (4 wheels);Cane - quad;Cane - single point;Hand held shower head;Shower seat - built in;Grab bars - tub/shower      Prior Function Prior Level of Function : Independent/Modified Independent             Mobility Comments: Pt reports RW as main mode during ambulation. Some use of w/c at times. ADLs Comments: Reports she receives assist to complete ADLs at high [place]. Repotrs "they want someone there with me when I get up"     Extremity/Trunk Assessment   Upper Extremity Assessment Upper Extremity Assessment: Generalized weakness    Lower Extremity Assessment Lower Extremity Assessment: Generalized weakness    Cervical / Trunk Assessment Cervical / Trunk Assessment: Kyphotic  Communication   Communication Communication: No apparent difficulties    Cognition Arousal: Alert Behavior During Therapy: WFL for tasks assessed/performed       PT - Cognition Comments: A&O to self, and place Following commands: Intact       Cueing Cueing Techniques: Verbal cues     General Comments      Exercises     Assessment/Plan    PT Assessment All further PT needs can be met in the next venue of care;Patient needs continued PT services  PT Problem List Decreased strength;Decreased activity tolerance;Decreased balance;Decreased mobility       PT Treatment Interventions Gait training;Stair training;DME instruction;Functional mobility training;Balance training;Therapeutic exercise;Therapeutic activities    PT Goals (Current goals can be found in the Care Plan section)  Acute Rehab PT Goals Patient Stated Goal: Return to ALF PT Goal Formulation: With patient Time For Goal Achievement:  05/06/24 Potential to Achieve Goals: Good    Frequency Min 2X/week     Co-evaluation               AM-PAC PT "6 Clicks" Mobility  Outcome Measure Help needed turning from your back to your side while in a flat bed without using bedrails?: A Little Help needed moving from lying on your back to sitting on the side of a flat bed without using bedrails?: A Lot Help needed moving to and from a bed to a chair (including a wheelchair)?: A Little Help needed standing up from a chair using your arms (e.g., wheelchair or bedside chair)?: A Little Help needed to walk in hospital room?: A Little Help needed climbing 3-5 steps with a railing? : A Lot 6 Click Score: 16    End of Session Equipment Utilized During Treatment: Gait belt Activity Tolerance: Patient tolerated treatment well;Patient limited by fatigue Patient left: in chair;with call bell/phone within reach Nurse Communication: Mobility status PT Visit Diagnosis: Unsteadiness on feet (R26.81);Other abnormalities of gait and mobility (R26.89);Muscle weakness (generalized) (M62.81);History of falling (Z91.81);Difficulty in walking, not elsewhere classified (R26.2)    Time: 6160-7371 PT Time Calculation (min) (ACUTE ONLY): 25 min   Charges:  PT Evaluation $PT Eval Moderate Complexity: 1 Mod PT Treatments $Therapeutic Activity: 23-37 mins PT General Charges $$ ACUTE PT VISIT: 1 Visit       1:04 PM, 04/29/24 Ka Bench Powell-Butler, PT, DPT [place] with [facility]

## 2024-04-29 NOTE — TOC Progression Note (Signed)
 Transition of Care [facility]) - Progression Note    Patient Details  Name: [name] White MRN: [id number] Date of Birth: [birth year]  Transition of [facility]) CM/SW Contact  Orelia Binet, RN Phone Number: 04/29/2024, 2:58 PM  PT is recommending HHPT.  Tammy for [employer] is on site, CM updated her with PT eval, she will assess patient and plan for HHPT PT when returns. CM waiting to here from [facility]. Per Tammy she thinks patient is active for HHPT, FL2 started TOC following.   Expected Discharge Plan: Assisted Living Barriers to Discharge: Continued Medical Work up  Expected Discharge Plan and Services   Living arrangements for the past 2 months: Assisted Living Facility                  Social Determinants of Health (SDOH) Interventions SDOH Screenings   Food Insecurity: No Food Insecurity (04/24/2024)  Housing: Low Risk  (04/24/2024)  Transportation Needs: No Transportation Needs (04/24/2024)  Utilities: Not At Risk (04/24/2024)  Depression (PHQ2-9): Low Risk  (01/28/2020)  Social Connections: Socially Isolated (04/24/2024)  Tobacco Use: Low Risk  (04/24/2024)    Readmission Risk Interventions    04/25/2024    1:46 PM 12/27/2023    1:21 PM  Readmission Risk Prevention Plan  Transportation Screening Complete Complete  PCP or Specialist Appt within 3-5 Days Not Complete   Home Care Screening  Complete  Medication Review (RN CM)  Complete  HRI or Home Care Consult Complete   Social Work Consult for Recovery Care Planning/Counseling Complete   Palliative Care Screening Not Complete   Medication Review Oceanographer) Complete

## 2024-04-29 NOTE — Plan of Care (Signed)
  Problem: Acute Rehab PT Goals(only PT should resolve) Goal: Pt Will Go Supine/Side To Sit Outcome: Progressing Flowsheets (Taken 04/29/2024 1300) Pt will go Supine/Side to Sit: with contact guard assist Goal: Patient Will Perform Sitting Balance Outcome: Progressing Flowsheets (Taken 04/29/2024 1300) Patient will perform sitting balance: with contact guard assist Goal: Patient Will Transfer Sit To/From Stand Outcome: Progressing Flowsheets (Taken 04/29/2024 1300) Patient will transfer sit to/from stand: with contact guard assist Goal: Pt Will Transfer Bed To Chair/Chair To Bed Outcome: Progressing Flowsheets (Taken 04/29/2024 1300) Pt will Transfer Bed to Chair/Chair to Bed: with contact guard assist Goal: Pt Will Ambulate Outcome: Progressing Flowsheets (Taken 04/29/2024 1300) Pt will Ambulate:  10 feet  with contact guard assist  with rolling walker    1:01 PM, 04/29/24 [name] White, PT, DPT [place] with [facility]

## 2024-04-29 NOTE — Inpatient Diabetes Management (Signed)
 Inpatient Diabetes Program Recommendations  AACE/ADA: New Consensus Statement on Inpatient Glycemic Control   Target Ranges:  Prepandial:   less than 140 mg/dL      Peak postprandial:   less than 180 mg/dL (1-2 hours)      Critically ill patients:  140 - 180 mg/dL    Latest Reference Range & Units 04/28/24 07:50 04/28/24 11:30 04/28/24 16:42 04/28/24 21:56 04/29/24 07:27  Glucose-Capillary 70 - 99 mg/dL 161 (H) 096 (H) 045 (H) 218 (H) 213 (H)   Review of Glycemic Control  Diabetes history: DM2 Outpatient Diabetes medications: Metformin  500 mg BID Current orders for Inpatient glycemic control: Novolog  0-9 units TID with meals, Novolog  0-5 units at bedtime; Solumedrol 40 mg Q12H  Inpatient Diabetes Program Recommendations:    Insulin : If steroids continued as ordered, please consider ordering Novolog  3 units TID with meals for meal coverage if patient eats at least 50% of meals.  Thanks, Beacher Limerick, RN, MSN, CDCES Diabetes Coordinator Inpatient Diabetes Program [phone] (Team Pager from 8am to 5pm)

## 2024-04-29 NOTE — Evaluation (Signed)
 Modified Barium Swallow Study  Patient Details  Name: [name] White MRN: [id number] Date of Birth: [birth year]  Today's Date: 04/29/2024  Modified Barium Swallow completed.  Full report located under Chart Review in the Imaging Section.  History of Present Illness [name] White is a 83 y.o. female with medical history significant for diabetes, hypertension, stroke, pituitary adenoma, PSVT, anxiety and depression.  Patient was brought to the ED from her group facility with reports of confusion that started today, also with cough, fever.   At the time of my evaluation, patient is lethargic, responds to direction but unable to answer questions or give history. Recent hospitalization 3/17 to 3/25 for acute/early subacute stroke with concomitant acute metabolic encephalopathy.  Was discharged to SNF.     Patient has been admitted with septic shock secondary to multifocal pneumonia along with associated acute hypoxemic respiratory failure and acute metabolic encephalopathy. BSE requested.   Clinical Impression Patient presents with oropharyngeal swallowing to be essentially [facility]. Note one episode of flash frank penetration of thin liquids that was cleared during the swallow and occasional premature spillage of thin liquids penetrated before the swallow but was cleared during the swallow every time. Swallow is often triggered at the posterior angle of the ramus but sometimes is delayed until bolus fills the pyriform sinuses. Pharyngeal stripping wave is slightly diminished resulting in occasional trace pharyngeal residue. Note suspected small Zenker's Diverticulum (ZD) - no radiologist present to confirm. Residue in ZD with retrograde movement back up to the pyriforms. Repeat dry swallow clears retrograde residue from the pyriforms, however note it again collects in the ZD. No retrograde residue was penetrated or aspirated. Esophageal sweep was unremarkable. Recommend upgrade Pt's diet to D3/mech soft and  continue with thin liquids. Recommend Pt utilize repeat swallow with every sip/bite to ensure trace/min retrograde residue is cleared from pharynx. Reviewed findings with patient and RN. There are no further ST needs noted at this time, our service will sign off. Factors that may increase risk of adverse event in presence of aspiration Roderick Civatte & Jessy Morocco 2021):    Swallow Evaluation Recommendations Recommendations: PO diet PO Diet Recommendation: Dysphagia 3 (Mechanical soft);Thin liquids (Level 0) Liquid Administration via: Cup;Straw Medication Administration: Whole meds with liquid Supervision: Patient able to self-feed Swallowing strategies  : Slow rate;Small bites/sips;Multiple dry swallows after each bite/sip Postural changes: Position pt fully upright for meals Oral care recommendations: Oral care BID (2x/day)    [place] Daisey H. Vergil Glasser, CCC-SLP Speech Language Pathologist   Florina Husbands 04/29/2024,12:40 PM

## 2024-04-29 NOTE — Progress Notes (Signed)
 PROGRESS NOTE    [name] White  ZOX:[id number] DOB: 03/03/1941 DOA: 04/24/2024 PCP: Omie Bickers, MD   Brief Narrative:    [name] White is a 83 y.o. female with medical history significant for diabetes, hypertension, stroke, pituitary adenoma, PSVT, anxiety and depression. Patient was brought to the ED from her group facility with reports of confusion that started today, also with cough, fever.   At the time of my evaluation, patient is lethargic, responds to direction but unable to answer questions or give history.  Patient's son [name] White) is at bedside.    Recent hospitalization 3/17 to 3/25 for acute/early subacute stroke with concomitant acute metabolic encephalopathy.  Was discharged to SNF.  Patient has been admitted with septic shock secondary to multifocal pneumonia along with associated acute hypoxemic respiratory failure and acute metabolic encephalopathy.  Assessment & Plan:   Principal Problem:   Septic shock (HCC) Active Problems:   Acute metabolic encephalopathy   Multifocal pneumonia   Acute respiratory failure with hypoxia (HCC)   CVA (cerebral vascular accident) (HCC)   Hypothyroidism   Diabetes (HCC)   Essential hypertension   Pressure injury of skin   Anxiety and depression  Assessment and Plan:  Septic shock (HCC)-resolved Septic shock secondary to multifocal pneumonia.  Meeting severe sepsis criteria with fever of 101.1, tachypnea, respiratory rate 16-25, hypotension, blood pressure down to 77/48, now improved on pressors.  Lactic acid 1.6 > 2.  Days of endorgan dysfunction -acute respiratory failure, altered mental status. UA not suggestive of UTI. - Vital signs has remained stable after Levophed  peripherally has been discontinued. - Will continue gentle IV diuresis and continue to wean off oxygen supplementation - Continue current IV antibiotics and follow clinical response.   Acute respiratory failure with hypoxia (HCC)-now improving O2 sats  down to 80%, currently on 2-3 L.  Sats greater than 93%.  ABG showed pH of 7.4, pCO2 of 47.  Likely secondary to multifocal pneumonia, vascular congestion from fluid resuscitation and COPD exacerbation.. - Continue IV diuresis, follow daily weights and low-sodium diet -Will continue steroids, bronchodilator management, Pulmicort/Brovana - Wean off oxygen supplementation as tolerated.   Multifocal pneumonia Multifocal pneumonia with septic shock and acute hypoxic respiratory failure. CT chest shows multifocal pneumonia.  COVID influenza RSV negative.  MRSA negative. -Continue IV ceftriaxone  and azithromycin  - Urine strep pneumo negative - Blood cultures so far without any growth - Continue supportive care and follow clinical response.   Acute metabolic encephalopathy-resolved Likely secondary to septic shock, multifocal pneumonia.  Patient confused and lethargic.  Head CT negative for acute abnormality. -Management per above - Continue resuming psychoactive medications as per prior to admission regimen. - Constant reorientation, supportive care will be provided.   CVA (cerebral vascular accident) (HCC) -Continue Plavix  for secondary prevention - No acute neurologic deficit appreciated.   Anxiety and depression -Continue resuming psychoactive medications as per home regimen - Patient following commands appropriately and oriented x 3.   Essential hypertension -Hypotension resolved, continue to hold antihypertensives while on IV diuresis with Lasix  - Follow vital signs.   Diabetes (HCC) - HgbA1c 7.0%. - Continue sliding scale insulin  - Follow CBGs fluctuation.   Hypothyroidism -Continue Synthroid .    DVT prophylaxis:Lovenox  Code Status: DNR Family Communication: No family at bedside on today's examination. Disposition Plan:  Status is: Inpatient Remains inpatient appropriate because: Need for IV medications.  Skin Assessment:  I have examined the patient's skin and I agree  with the wound assessment as performed by  the wound care RN as outlined below:  Pressure Injury 04/24/24 Sacrum Mid Stage 1 -  Intact skin with non-blanchable redness of a localized area usually over a bony prominence. (Active)  04/24/24 1724  Location: Sacrum  Location Orientation: Mid  Staging: Stage 1 -  Intact skin with non-blanchable redness of a localized area usually over a bony prominence.  Wound Description (Comments):   Present on Admission:   Dressing Type Foam - Lift dressing to assess site every shift 04/29/24 0727    Consultants:  None  Procedures:  None  Antimicrobials:  Anti-infectives (From admission, onward)    Start     Dose/Rate Route Frequency Ordered Stop   04/25/24 1600  vancomycin  (VANCOCIN ) IVPB 1000 mg/200 mL premix  Status:  Discontinued        1,000 mg 200 mL/hr over 60 Minutes Intravenous Every 24 hours 04/24/24 1542 04/24/24 1945   04/25/24 1200  azithromycin  (ZITHROMAX ) 500 mg in sodium chloride  0.9 % 250 mL IVPB        500 mg 250 mL/hr over 60 Minutes Intravenous Every 24 hours 04/24/24 2116     04/25/24 1100  cefTRIAXone  (ROCEPHIN ) 2 g in sodium chloride  0.9 % 100 mL IVPB        2 g 200 mL/hr over 30 Minutes Intravenous Every 24 hours 04/24/24 2116     04/24/24 1600  vancomycin  (VANCOREADY) IVPB 1250 mg/250 mL        1,250 mg 166.7 mL/hr over 90 Minutes Intravenous  Once 04/24/24 1541 04/25/24 0600   04/24/24 1530  ceFEPIme  (MAXIPIME ) 2 g in sodium chloride  0.9 % 100 mL IVPB        2 g 200 mL/hr over 30 Minutes Intravenous  Once 04/24/24 1517 04/24/24 1729   04/24/24 1130  cefTRIAXone  (ROCEPHIN ) 2 g in sodium chloride  0.9 % 100 mL IVPB        2 g 200 mL/hr over 30 Minutes Intravenous Once 04/24/24 1119 04/24/24 1225   04/24/24 1130  azithromycin  (ZITHROMAX ) 500 mg in sodium chloride  0.9 % 250 mL IVPB        500 mg 250 mL/hr over 60 Minutes Intravenous  Once 04/24/24 1119 04/24/24 1407       Subjective: Patient denies any new complaints  or concerns this a.m., still appears to have some ongoing shortness of breath.  Still on nasal cannula oxygen.  Objective: Vitals:   04/28/24 2202 04/28/24 2205 04/29/24 0530 04/29/24 0727  BP:      Pulse:      Resp:      Temp: 97.8 F (36.6 C)     TempSrc: Oral     SpO2:  97%  93%  Weight:   58.4 kg   Height:        Intake/Output Summary (Last 24 hours) at 04/29/2024 1025 Last data filed at 04/29/2024 0817 Gross per 24 hour  Intake 1390 ml  Output 1400 ml  Net -10 ml   Filed Weights   04/24/24 1715 04/27/24 0500 04/29/24 0530  Weight: 59.6 kg 56.9 kg 58.4 kg    Examination: General exam: Alert, awake, oriented x 3; following commands appropriate.  In no acute distress. Respiratory system: Short winded with activity; decreased breath sounds appreciated at the bases.  3 L nasal cannula supplementation in place.  Short winded with activity.  Positive expiratory wheezing bilaterally Cardiovascular system:RRR. No rubs or gallops; no JVD on exam. Gastrointestinal system: Abdomen is nondistended, soft and nontender. No organomegaly or masses felt.  Normal bowel sounds heard. Central nervous system:No focal neurological deficits. Extremities: No cyanosis or clubbing. Skin: No petechiae; stage I pressure injury present at time of admission without signs of superimposed infection. Psychiatry: Judgement and insight appear normal.  Flat affect on exam.  Data Reviewed: I have personally reviewed following labs and imaging studies  CBC: Recent Labs  Lab 04/24/24 1113 04/25/24 0429 04/26/24 0425 04/27/24 0532 04/28/24 0425  WBC 8.1 6.1 11.0* 7.7 12.5*  NEUTROABS 6.1  --   --   --   --   HGB 13.7 11.8* 10.6* 11.3* 11.7*  HCT 41.2 37.0 33.8* 34.5* 36.0  MCV 87.8 90.9 90.6 87.6 88.0  PLT 260 211 220 223 270   Basic Metabolic Panel: Recent Labs  Lab 04/25/24 0429 04/26/24 0425 04/27/24 0532 04/28/24 0425 04/29/24 0348  NA 137 140 139 138 135  K 3.5 3.5 3.4* 4.2 4.2  CL 107  106 104 101 98  CO2 23 27 27 29 29   GLUCOSE 178* 103* 202* 186* 239*  BUN 12 9 7* 10 15  CREATININE 0.52 0.61 0.37* 0.52 0.49  CALCIUM  7.6* 7.7* 7.9* 8.4* 8.4*  MG  --  1.4* 1.7 1.7 1.7   GFR: Estimated Creatinine Clearance: 48.8 mL/min (by C-G formula based on SCr of 0.49 mg/dL).  Liver Function Tests: Recent Labs  Lab 04/24/24 1113  AST 26  ALT 20  ALKPHOS 57  BILITOT 0.9  PROT 7.1  ALBUMIN 3.4*   CBG: Recent Labs  Lab 04/28/24 0750 04/28/24 1130 04/28/24 1642 04/28/24 2156 04/29/24 0727  GLUCAP 195* 249* 264* 218* 213*    Sepsis Labs: Recent Labs  Lab 04/24/24 1113 04/24/24 1309 04/26/24 0425  LATICACIDVEN 1.6 2.0* 1.3    Recent Results (from the past 240 hours)  Culture, blood (Routine x 2)     Status: None   Collection Time: 04/24/24 10:53 AM   Specimen: BLOOD  Result Value Ref Range Status   Specimen Description BLOOD LEFT ASSIST CONTROL  Final   Special Requests   Final    BOTTLES DRAWN AEROBIC AND ANAEROBIC Blood Culture adequate volume   Culture   Final    NO GROWTH 5 DAYS Performed at [facility], [address] [place], [place] [address]    Report Status 04/29/2024 FINAL  Final  Resp panel by RT-PCR (RSV, Flu A&B, Covid) Anterior Nasal Swab     Status: None   Collection Time: 04/24/24 11:19 AM   Specimen: Anterior Nasal Swab  Result Value Ref Range Status   SARS Coronavirus 2 by RT PCR NEGATIVE NEGATIVE Final    Comment: (NOTE) SARS-CoV-2 target nucleic acids are NOT DETECTED.  The SARS-CoV-2 RNA is generally detectable in upper respiratory specimens during the acute phase of infection. The lowest concentration of SARS-CoV-2 viral copies this assay can detect is 138 copies/mL. A negative result does not preclude SARS-Cov-2 infection and should not be used as the sole basis for treatment or other patient management decisions. A negative result may occur with  improper specimen collection/handling, submission of specimen other than  nasopharyngeal swab, presence of viral mutation(s) within the areas targeted by this assay, and inadequate number of viral copies(<138 copies/mL). A negative result must be combined with clinical observations, patient history, and epidemiological information. The expected result is Negative.  Fact Sheet for Patients:  BloggerCourse.com  Fact Sheet for Healthcare Providers:  SeriousBroker.it  This test is no t yet approved or cleared by the [place]  FDA and  has been  authorized for detection and/or diagnosis of SARS-CoV-2 by FDA under an Emergency Use Authorization (EUA). This EUA will remain  in effect (meaning this test can be used) for the duration of the COVID-19 declaration under Section 564(b)(1) of the Act, 21 U.S.C.section 360bbb-3(b)(1), unless the authorization is terminated  or revoked sooner.       Influenza A by PCR NEGATIVE NEGATIVE Final   Influenza B by PCR NEGATIVE NEGATIVE Final    Comment: (NOTE) The Xpert Xpress SARS-CoV-2/FLU/RSV plus assay is intended as an aid in the diagnosis of influenza from Nasopharyngeal swab specimens and should not be used as a sole basis for treatment. Nasal washings and aspirates are unacceptable for Xpert Xpress SARS-CoV-2/FLU/RSV testing.  Fact Sheet for Patients: BloggerCourse.com  Fact Sheet for Healthcare Providers: SeriousBroker.it  This test is not yet approved or cleared by the [place]  FDA and has been authorized for detection and/or diagnosis of SARS-CoV-2 by FDA under an Emergency Use Authorization (EUA). This EUA will remain in effect (meaning this test can be used) for the duration of the COVID-19 declaration under Section 564(b)(1) of the Act, 21 U.S.C. section 360bbb-3(b)(1), unless the authorization is terminated or revoked.     Resp Syncytial Virus by PCR NEGATIVE NEGATIVE Final    Comment:  (NOTE) Fact Sheet for Patients: BloggerCourse.com  Fact Sheet for Healthcare Providers: SeriousBroker.it  This test is not yet approved or cleared by the [place]  FDA and has been authorized for detection and/or diagnosis of SARS-CoV-2 by FDA under an Emergency Use Authorization (EUA). This EUA will remain in effect (meaning this test can be used) for the duration of the COVID-19 declaration under Section 564(b)(1) of the Act, 21 U.S.C. section 360bbb-3(b)(1), unless the authorization is terminated or revoked.  Performed at [facility], [address], [place], [place] [address]   Culture, blood (Routine X 2) w Reflex to ID Panel     Status: None   Collection Time: 04/24/24 11:40 AM   Specimen: BLOOD  Result Value Ref Range Status   Specimen Description BLOOD BLOOD LEFT WRIST  Final   Special Requests   Final    BOTTLES DRAWN AEROBIC AND ANAEROBIC Blood Culture results may not be optimal due to an inadequate volume of blood received in culture bottles   Culture   Final    NO GROWTH 5 DAYS Performed at [facility], [address], [place], [place] [address]    Report Status 04/29/2024 FINAL  Final  MRSA Next Gen by PCR, Nasal     Status: None   Collection Time: 04/24/24  5:20 PM   Specimen: Nasal Mucosa; Nasal Swab  Result Value Ref Range Status   MRSA by PCR Next Gen NOT DETECTED NOT DETECTED Final    Comment: (NOTE) The GeneXpert MRSA Assay (FDA approved for NASAL specimens only), is one component of a comprehensive MRSA colonization surveillance program. It is not intended to diagnose MRSA infection nor to guide or monitor treatment for MRSA infections. Test performance is not FDA approved in patients less than 52 years old. Performed at [facility], [address], [place], [place] [address]          Radiology Studies: ECHOCARDIOGRAM COMPLETE Result Date: 04/27/2024    ECHOCARDIOGRAM REPORT   Patient Name:    [name] White Date of Exam: 04/27/2024 Medical Rec #:  [id number]      Height:       65.0 in Accession #:    [id number]  Weight:       125.4 lb Date of Birth:  23-Sep-1941       BSA:          1.622 m Patient Age:    82 years       BP:           91/54 mmHg Patient Gender: F              HR:           99 bpm. Exam Location:  [facility] Procedure: 2D Echo, Color Doppler and Cardiac Doppler (Both Spectral and Color            Flow Doppler were utilized during procedure). Indications:    I50.31 Acute diastolic (congestive) heart failure  History:        Patient has prior history of Echocardiogram examinations, most                 recent 03/13/2024. Risk Factors:Hypertension, Diabetes and                 Dyslipidemia.  Sonographer:    Sherline Distel Senior RDCS Referring Phys: [id number] [place] Condon D [facility] IMPRESSIONS  1. Left ventricular ejection fraction, by estimation, is 60 to 65%. The left ventricle has normal function. The left ventricle has no regional wall motion abnormalities. Left ventricular diastolic parameters are indeterminate.  2. Right ventricular systolic function is normal. The right ventricular size is normal.  3. Left atrial size was mildly dilated.  4. MVA by PT1/2 1.23 cm2 with men gradient 12 peak 19 mmHg at HR of 101 bpm. The mitral valve is abnormal. Mild mitral valve regurgitation. Moderate mitral stenosis. Moderate mitral annular calcification.  5. The aortic valve is tricuspid. There is mild calcification of the aortic valve. There is mild thickening of the aortic valve. Aortic valve regurgitation is trivial. Aortic valve sclerosis is present, with no evidence of aortic valve stenosis.  6. The inferior vena cava is normal in size with greater than 50% respiratory variability, suggesting right atrial pressure of 3 mmHg. FINDINGS  Left Ventricle: Left ventricular ejection fraction, by estimation, is 60 to 65%. The left ventricle has normal function. The left ventricle has no regional wall motion abnormalities.  Strain was performed and the global longitudinal strain is indeterminate. The left ventricular internal cavity size was normal in size. There is no left ventricular hypertrophy. Left ventricular diastolic parameters are indeterminate. Right Ventricle: The right ventricular size is normal. No increase in right ventricular wall thickness. Right ventricular systolic function is normal. Left Atrium: Left atrial size was mildly dilated. Right Atrium: Right atrial size was normal in size. Pericardium: There is no evidence of pericardial effusion. Mitral Valve: MVA by PT1/2 1.23 cm2 with men gradient 12 peak 19 mmHg at HR of 101 bpm. The mitral valve is abnormal. There is moderate thickening of the mitral valve leaflet(s). There is moderate calcification of the mitral valve leaflet(s). Moderate mitral annular calcification. Mild mitral valve regurgitation. Moderate mitral valve stenosis. MV peak gradient, 19.1 mmHg. The mean mitral valve gradient is 12.7 mmHg. Tricuspid Valve: The tricuspid valve is normal in structure. Tricuspid valve regurgitation is trivial. No evidence of tricuspid stenosis. Aortic Valve: The aortic valve is tricuspid. There is mild calcification of the aortic valve. There is mild thickening of the aortic valve. Aortic valve regurgitation is trivial. Aortic valve sclerosis is present, with no evidence of aortic valve stenosis. Pulmonic Valve: The pulmonic valve was normal in structure. Pulmonic valve  regurgitation is not visualized. No evidence of pulmonic stenosis. Aorta: The aortic root is normal in size and structure. Venous: The inferior vena cava is normal in size with greater than 50% respiratory variability, suggesting right atrial pressure of 3 mmHg. IAS/Shunts: No atrial level shunt detected by color flow Doppler. Additional Comments: 3D was performed not requiring image post processing on an independent workstation and was indeterminate.  LEFT VENTRICLE PLAX 2D LVIDd:         3.70 cm LVIDs:          2.90 cm LV PW:         0.70 cm LV IVS:        0.80 cm LVOT diam:     1.80 cm LV SV:         55 LV SV Index:   34 LVOT Area:     2.54 cm  RIGHT VENTRICLE RV S prime:     13.80 cm/s TAPSE (M-mode): 1.8 cm LEFT ATRIUM             Index        RIGHT ATRIUM           Index LA diam:        3.80 cm 2.34 cm/m   RA Area:     13.30 cm LA Vol (A2C):   60.3 ml 37.17 ml/m  RA Volume:   28.90 ml  17.81 ml/m LA Vol (A4C):   45.0 ml 27.74 ml/m LA Biplane Vol: 55.6 ml 34.27 ml/m  AORTIC VALVE LVOT Vmax:   115.00 cm/s LVOT Vmean:  76.200 cm/s LVOT VTI:    0.216 m  AORTA Ao Root diam: 3.00 cm Ao Asc diam:  3.20 cm MITRAL VALVE MV Area VTI:  1.15 cm    SHUNTS MV Peak grad: 19.1 mmHg   Systemic VTI:  0.22 m MV Mean grad: 12.7 mmHg   Systemic Diam: 1.80 cm MV Vmax:      2.18 m/s MV Vmean:     172.3 cm/s Janelle Mediate MD Electronically signed by Janelle Mediate MD Signature Date/Time: 04/27/2024/2:17:05 PM    Final         Scheduled Meds:  acetylcysteine   4 mL Nebulization TID   arformoterol  15 mcg Nebulization BID   atorvastatin   40 mg Oral Daily   budesonide (PULMICORT) nebulizer solution  0.5 mg Nebulization BID   Chlorhexidine  Gluconate Cloth  6 each Topical Daily   clopidogrel   75 mg Oral Daily   cycloSPORINE   1 drop Both Eyes BID   enoxaparin  (LOVENOX ) injection  40 mg Subcutaneous Q24H   feeding supplement  237 mL Oral BID BM   insulin  aspart  0-5 Units Subcutaneous QHS   insulin  aspart  0-9 Units Subcutaneous TID WC   insulin  aspart  3 Units Subcutaneous TID WC   ipratropium-albuterol   3 mL Nebulization TID   levothyroxine   50 mcg Oral Q0600   methylPREDNISolone  (SOLU-MEDROL ) injection  40 mg Intravenous Q12H   mirabegron  ER  25 mg Oral Daily   pregabalin   150 mg Oral BID   venlafaxine  XR  150 mg Oral BH-q7a   vortioxetine  HBr  10 mg Oral Daily   Continuous Infusions:  azithromycin  500 mg (04/28/24 1229)   cefTRIAXone  (ROCEPHIN )  IV 2 g (04/28/24 1021)     LOS: 5 days    Total care time  spent: 55 minutes    Royce Stegman Loran Rock, DO [facility]  If 7PM-7AM, please contact night-coverage [email or web address] 04/29/2024, 10:25  AM

## 2024-04-30 DIAGNOSIS — R6521 Severe sepsis with septic shock: Secondary | ICD-10-CM | POA: Diagnosis not present

## 2024-04-30 DIAGNOSIS — A419 Sepsis, unspecified organism: Secondary | ICD-10-CM | POA: Diagnosis not present

## 2024-04-30 LAB — BASIC METABOLIC PANEL WITH GFR
Anion gap: 10 (ref 5–15)
BUN: 19 mg/dL (ref 8–23)
CO2: 30 mmol/L (ref 22–32)
Calcium: 8.7 mg/dL — ABNORMAL LOW (ref 8.9–10.3)
Chloride: 94 mmol/L — ABNORMAL LOW (ref 98–111)
Creatinine, Ser: 0.55 mg/dL (ref 0.44–1.00)
GFR, Estimated: >60 mL/min (ref >60)
Glucose, Bld: 280 mg/dL — ABNORMAL HIGH (ref 70–99)
Potassium: 4 mmol/L (ref 3.5–5.1)
Sodium: 134 mmol/L — ABNORMAL LOW (ref 135–145)

## 2024-04-30 LAB — GLUCOSE, CAPILLARY
Glucose-Capillary: 265 mg/dL — ABNORMAL HIGH (ref 70–99)
Glucose-Capillary: 367 mg/dL — ABNORMAL HIGH (ref 70–99)
Glucose-Capillary: 289 mg/dL — ABNORMAL HIGH (ref 70–99)
Glucose-Capillary: 216 mg/dL — ABNORMAL HIGH (ref 70–99)

## 2024-04-30 LAB — CBC
HCT: 38.3 % (ref 36.0–46.0)
Hemoglobin: 12.4 g/dL (ref 12.0–15.0)
MCH: 28.4 pg (ref 26.0–34.0)
MCHC: 32.4 g/dL (ref 30.0–36.0)
MCV: 87.6 fL (ref 80.0–100.0)
Platelets: 319 10*3/uL (ref 150–400)
RBC: 4.37 MIL/uL (ref 3.87–5.11)
RDW: 15 % (ref 11.5–15.5)
WBC: 12.7 10*3/uL — ABNORMAL HIGH (ref 4.0–10.5)
nRBC: 0 % (ref 0.0–0.2)

## 2024-04-30 LAB — MAGNESIUM: Magnesium: 1.7 mg/dL (ref 1.7–2.4)

## 2024-04-30 MED ORDER — FUROSEMIDE 10 MG/ML IJ SOLN
40.0000 mg | Freq: Two times a day (BID) | INTRAMUSCULAR | Status: DC
  Administered 2024-04-30 – 2024-05-01 (×3): 40 mg via INTRAVENOUS
  Filled 2024-04-30 (×2): qty 4

## 2024-04-30 MED ORDER — INSULIN ASPART 100 UNIT/ML IJ SOLN
5.0000 [IU] | Freq: Three times a day (TID) | INTRAMUSCULAR | Status: DC
  Administered 2024-04-30 – 2024-05-01 (×2): 5 [IU] via SUBCUTANEOUS

## 2024-04-30 NOTE — Progress Notes (Signed)
 Physical Therapy Treatment Patient Details Name: [name] White MRN: [id number] DOB: 10/28/1941 Today's Date: 04/30/2024   History of Present Illness [name] White is a 83 y.o. female with medical history significant for diabetes, hypertension, stroke, pituitary adenoma, PSVT, anxiety and depression.  Patient was brought to the ED from her group facility with reports of confusion that started today, also with cough, fever.   At the time of my evaluation, patient is lethargic, responds to direction but unable to answer questions or give history.  Patient's son [name] White) is at bedside.    PT Comments  Patient tolerated session well. Nursing staff present at beginning of session and reports patient tolerated time OOB in recliner earlier in the day as well as ambulation into the hallway. Session spent performing UE, LE, and core strengthening exercises seated EOB. Pt demo good seated balance t/o. Mod(I)-CGA for supine>sit, and mod(I) for sit>supine. One STS from bed with RW and min/mod A as well as one step towards head of bed with min A/CGA. HR reaches 103 bpm at max during exercises. Patient on RA t/o, SpO2 WNL. Patient left in bed with call button near. Nursing notified after session. Patient will benefit from continued skilled physical therapy acutely and in recommended venue in order to improve strength, balance, and endurance to return to ALF safely and with max independence.     If plan is discharge home, recommend the following: A lot of help with walking and/or transfers;A little help with bathing/dressing/bathroom;Assistance with cooking/housework   Can travel by private vehicle        Equipment Recommendations  None recommended by PT    Recommendations for Other Services       Precautions / Restrictions Precautions Precautions: Fall Restrictions Weight Bearing Restrictions Per Provider Order: No     Mobility  Bed Mobility Overal bed mobility: Modified Independent, Needs  Assistance Bed Mobility: Supine to Sit     Supine to sit: Contact guard     General bed mobility comments: HOB elevated, use of bed railing, and inc time needed to complete without assist. CGA once EOB.    Transfers Overall transfer level: Needs assistance Equipment used: Rolling walker (2 wheels) Transfers: Sit to/from Stand Sit to Stand: Min assist, Mod assist         General transfer comment: Attempt to STS with RW (I) unsuccessful. Required min/mod A and v cueing for 1:1 hand placement with RW.    Ambulation/Gait       General Gait Details: Not performed during this session. Patient and Nursing staff report ambulation earlier in the day as well as patient sitting OOB in recliner. Opted to perfom EOB exercises.   Stairs       Wheelchair Mobility     Tilt Bed    Modified Rankin (Stroke Patients Only)       Balance Overall balance assessment: Needs assistance Sitting-balance support: No upper extremity supported, Feet supported, Feet unsupported Sitting balance-Leahy Scale: Good Sitting balance - Comments: Good seated balance. Pt able to maintain balance with varying support from UE/LE. At one point, pt able to maintain balance with no UE oe LE support and CGA for a few seconds during EOB exercises   Standing balance support: Bilateral upper extremity supported, During functional activity, Reliant on assistive device for balance Standing balance-Leahy Scale: Fair Standing balance comment: Fair/good w/ RW            Communication Communication Communication: No apparent difficulties  Cognition Arousal: Alert Behavior  During Therapy: WFL for tasks assessed/performed       Following commands: Intact      Cueing Cueing Techniques: Verbal cues  Exercises General Exercises - Upper Extremity Shoulder Flexion: AROM, Strengthening, Both, 10 reps, Seated Elbow Flexion: AROM, Strengthening, Both, 10 reps, Seated General Exercises - Lower  Extremity Ankle Circles/Pumps: AROM, Strengthening, Both, 10 reps, Seated Long Arc Quad: AROM, Strengthening, Both, 10 reps, Seated Hip ABduction/ADduction: AROM, Strengthening, Both, 10 reps, Seated Hip Flexion/Marching: AROM, Strengthening, Both, 10 reps, Seated    General Comments        Pertinent Vitals/Pain Pain Assessment Pain Assessment: No/denies pain    Home Living                          Prior Function            PT Goals (current goals can now be found in the care plan section) Acute Rehab PT Goals Patient Stated Goal: Return to ALF PT Goal Formulation: With patient Time For Goal Achievement: 05/06/24 Potential to Achieve Goals: Good Progress towards PT goals: Progressing toward goals    Frequency    Min 3X/week      PT Plan      Co-evaluation              AM-PAC PT "6 Clicks" Mobility   Outcome Measure  Help needed turning from your back to your side while in a flat bed without using bedrails?: A Little Help needed moving from lying on your back to sitting on the side of a flat bed without using bedrails?: A Lot Help needed moving to and from a bed to a chair (including a wheelchair)?: A Little Help needed standing up from a chair using your arms (e.g., wheelchair or bedside chair)?: A Little Help needed to walk in hospital room?: A Little Help needed climbing 3-5 steps with a railing? : A Lot 6 Click Score: 16    End of Session   Activity Tolerance: Patient tolerated treatment well Patient left: in bed;with call bell/phone within reach Nurse Communication: Mobility status PT Visit Diagnosis: Unsteadiness on feet (R26.81);Other abnormalities of gait and mobility (R26.89);Muscle weakness (generalized) (M62.81);History of falling (Z91.81);Difficulty in walking, not elsewhere classified (R26.2)     Time: 1610-9604 PT Time Calculation (min) (ACUTE ONLY): 15 min  Charges:    $Therapeutic Exercise: 8-22 mins PT General  Charges $$ ACUTE PT VISIT: 1 Visit                     4:09 PM, 04/30/24 [name] White, PT, DPT [place] [facility]

## 2024-04-30 NOTE — Progress Notes (Signed)
 PROGRESS NOTE    [name] White  WUJ:[id number] DOB: 1941/08/10 DOA: 04/24/2024 PCP: Omie Bickers, MD   Brief Narrative:    [name] White is a 83 y.o. female with medical history significant for diabetes, hypertension, stroke, pituitary adenoma, PSVT, anxiety and depression. Patient was brought to the ED from her group facility with reports of confusion that started today, also with cough, fever.   At the time of my evaluation, patient is lethargic, responds to direction but unable to answer questions or give history.  Patient's son [name] White) is at bedside.    Recent hospitalization 3/17 to 3/25 for acute/early subacute stroke with concomitant acute metabolic encephalopathy.  Was discharged to SNF.  Patient has been admitted with septic shock secondary to multifocal pneumonia along with associated acute hypoxemic respiratory failure and acute metabolic encephalopathy.  She continues to have some ongoing hypoxemia requiring further use of IV steroids and some further IV diuresis.  She is noted to have ongoing shortness of breath with exertion even though she has been weaned off of oxygen.  Assessment & Plan:   Principal Problem:   Septic shock (HCC) Active Problems:   Acute metabolic encephalopathy   Multifocal pneumonia   Acute respiratory failure with hypoxia (HCC)   CVA (cerebral vascular accident) (HCC)   Hypothyroidism   Diabetes (HCC)   Essential hypertension   Pressure injury of skin   Anxiety and depression  Assessment and Plan:  Septic shock (HCC)-resolved Septic shock secondary to multifocal pneumonia.  Meeting severe sepsis criteria with fever of 101.1, tachypnea, respiratory rate 16-25, hypotension, blood pressure down to 77/48, now improved on pressors.  Lactic acid 1.6 > 2.  Days of endorgan dysfunction -acute respiratory failure, altered mental status. UA not suggestive of UTI. - Vital signs has remained stable  - Will continue IV diuresis due to dyspnea with  exertion - IV antibiotic course completed and therefore discontinued   Acute respiratory failure with hypoxia (HCC)-now improving O2 sats down to 80%, currently on 2-3 L.  Sats greater than 93%.  ABG showed pH of 7.4, pCO2 of 47.  Likely secondary to multifocal pneumonia, vascular congestion from fluid resuscitation and COPD exacerbation.. - Continue IV diuresis, follow daily weights and low-sodium diet -Will continue steroids, bronchodilator management, Pulmicort/Brovana - Wean off oxygen supplementation as tolerated. -Patient still with significant shortness of breath with exertion   Multifocal pneumonia Multifocal pneumonia with septic shock and acute hypoxic respiratory failure. CT chest shows multifocal pneumonia.  COVID influenza RSV negative.  MRSA negative. - Completed IV antibiotic course.   Acute metabolic encephalopathy-resolved Likely secondary to septic shock, multifocal pneumonia.  Patient confused and lethargic.  Head CT negative for acute abnormality. -Management per above - Continue resuming psychoactive medications as per prior to admission regimen. - Constant reorientation, supportive care will be provided.   CVA (cerebral vascular accident) (HCC) -Continue Plavix  for secondary prevention - No acute neurologic deficit appreciated.   Anxiety and depression -Continue resuming psychoactive medications as per home regimen - Patient following commands appropriately and oriented x 3.   Essential hypertension-controlled -Hypotension resolved, continue to hold antihypertensives while on IV diuresis with Lasix  - Follow vital signs.   Diabetes (HCC)-with mild steroid-induced hyperglycemia - HgbA1c 7.0%. - Continue sliding scale insulin  - Follow CBGs fluctuation. -Appreciate diabetes coordinators and will increase NovoLog  to 5 units 3 times daily with meals   Hypothyroidism -Continue Synthroid .    DVT prophylaxis:Lovenox  Code Status: DNR Family Communication: No  family at bedside  on today's examination. Disposition Plan:  Status is: Inpatient Remains inpatient appropriate because: Need for IV medications.  Skin Assessment:  I have examined the patient's skin and I agree with the wound assessment as performed by the wound care RN as outlined below:  Pressure Injury 04/24/24 Sacrum Mid Stage 1 -  Intact skin with non-blanchable redness of a localized area usually over a bony prominence. (Active)  04/24/24 1724  Location: Sacrum  Location Orientation: Mid  Staging: Stage 1 -  Intact skin with non-blanchable redness of a localized area usually over a bony prominence.  Wound Description (Comments):   Present on Admission:   Dressing Type Foam - Lift dressing to assess site every shift 04/30/24 0735    Consultants:  None  Procedures:  None  Antimicrobials:  Anti-infectives (From admission, onward)    Start     Dose/Rate Route Frequency Ordered Stop   04/25/24 1600  vancomycin  (VANCOCIN ) IVPB 1000 mg/200 mL premix  Status:  Discontinued        1,000 mg 200 mL/hr over 60 Minutes Intravenous Every 24 hours 04/24/24 1542 04/24/24 1945   04/25/24 1200  azithromycin  (ZITHROMAX ) 500 mg in sodium chloride  0.9 % 250 mL IVPB  Status:  Discontinued        500 mg 250 mL/hr over 60 Minutes Intravenous Every 24 hours 04/24/24 2116 04/29/24 1114   04/25/24 1100  cefTRIAXone  (ROCEPHIN ) 2 g in sodium chloride  0.9 % 100 mL IVPB  Status:  Discontinued        2 g 200 mL/hr over 30 Minutes Intravenous Every 24 hours 04/24/24 2116 04/29/24 1114   04/24/24 1600  vancomycin  (VANCOREADY) IVPB 1250 mg/250 mL        1,250 mg 166.7 mL/hr over 90 Minutes Intravenous  Once 04/24/24 1541 04/25/24 0600   04/24/24 1530  ceFEPIme  (MAXIPIME ) 2 g in sodium chloride  0.9 % 100 mL IVPB        2 g 200 mL/hr over 30 Minutes Intravenous  Once 04/24/24 1517 04/24/24 1729   04/24/24 1130  cefTRIAXone  (ROCEPHIN ) 2 g in sodium chloride  0.9 % 100 mL IVPB        2 g 200 mL/hr over  30 Minutes Intravenous Once 04/24/24 1119 04/24/24 1225   04/24/24 1130  azithromycin  (ZITHROMAX ) 500 mg in sodium chloride  0.9 % 250 mL IVPB        500 mg 250 mL/hr over 60 Minutes Intravenous  Once 04/24/24 1119 04/24/24 1407       Subjective: Patient denies any new complaints or concerns this a.m., still appears to have some ongoing shortness of breath while seated and in particular with exertion.  She has been weaned to room air at rest.  Objective: Vitals:   04/29/24 2112 04/30/24 0410 04/30/24 0500 04/30/24 0822  BP:  121/72    Pulse:  69    Resp:      Temp:  98.2 F (36.8 C)    TempSrc:  Oral    SpO2: 96% 96%  96%  Weight:      Height:   5\' 5"  (1.651 m)     Intake/Output Summary (Last 24 hours) at 04/30/2024 1154 Last data filed at 04/30/2024 0411 Gross per 24 hour  Intake 480 ml  Output 900 ml  Net -420 ml   Filed Weights   04/24/24 1715 04/27/24 0500 04/29/24 0530  Weight: 59.6 kg 56.9 kg 58.4 kg    Examination: General exam: Alert, awake, oriented x 3; following commands appropriate.  In no acute distress. Respiratory system: Short winded with activity; decreased breath sounds appreciated at the bases.  Currently on room air Cardiovascular system:RRR. No rubs or gallops; no JVD on exam. Gastrointestinal system: Abdomen is nondistended, soft and nontender. No organomegaly or masses felt. Normal bowel sounds heard. Central nervous system:No focal neurological deficits. Extremities: No cyanosis or clubbing. Skin: No petechiae; stage I pressure injury present at time of admission without signs of superimposed infection. Psychiatry: Judgement and insight appear normal.  Flat affect on exam.  Data Reviewed: I have personally reviewed following labs and imaging studies  CBC: Recent Labs  Lab 04/24/24 1113 04/25/24 0429 04/26/24 0425 04/27/24 0532 04/28/24 0425 04/30/24 0420  WBC 8.1 6.1 11.0* 7.7 12.5* 12.7*  NEUTROABS 6.1  --   --   --   --   --   HGB 13.7  11.8* 10.6* 11.3* 11.7* 12.4  HCT 41.2 37.0 33.8* 34.5* 36.0 38.3  MCV 87.8 90.9 90.6 87.6 88.0 87.6  PLT 260 211 220 223 270 319   Basic Metabolic Panel: Recent Labs  Lab 04/26/24 0425 04/27/24 0532 04/28/24 0425 04/29/24 0348 04/30/24 0420  NA 140 139 138 135 134*  K 3.5 3.4* 4.2 4.2 4.0  CL 106 104 101 98 94*  CO2 27 27 29 29 30   GLUCOSE 103* 202* 186* 239* 280*  BUN 9 7* 10 15 19   CREATININE 0.61 0.37* 0.52 0.49 0.55  CALCIUM  7.7* 7.9* 8.4* 8.4* 8.7*  MG 1.4* 1.7 1.7 1.7 1.7   GFR: Estimated Creatinine Clearance: 48.8 mL/min (by C-G formula based on SCr of 0.55 mg/dL).  Liver Function Tests: Recent Labs  Lab 04/24/24 1113  AST 26  ALT 20  ALKPHOS 57  BILITOT 0.9  PROT 7.1  ALBUMIN 3.4*   CBG: Recent Labs  Lab 04/29/24 1145 04/29/24 1647 04/29/24 2104 04/30/24 0732 04/30/24 1135  GLUCAP 278* 129* 348* 265* 367*    Sepsis Labs: Recent Labs  Lab 04/24/24 1113 04/24/24 1309 04/26/24 0425  LATICACIDVEN 1.6 2.0* 1.3    Recent Results (from the past 240 hours)  Culture, blood (Routine x 2)     Status: None   Collection Time: 04/24/24 10:53 AM   Specimen: BLOOD  Result Value Ref Range Status   Specimen Description BLOOD LEFT ASSIST CONTROL  Final   Special Requests   Final    BOTTLES DRAWN AEROBIC AND ANAEROBIC Blood Culture adequate volume   Culture   Final    NO GROWTH 5 DAYS Performed at [facility], [address] [place], [place] [address]    Report Status 04/29/2024 FINAL  Final  Resp panel by RT-PCR (RSV, Flu A&B, Covid) Anterior Nasal Swab     Status: None   Collection Time: 04/24/24 11:19 AM   Specimen: Anterior Nasal Swab  Result Value Ref Range Status   SARS Coronavirus 2 by RT PCR NEGATIVE NEGATIVE Final    Comment: (NOTE) SARS-CoV-2 target nucleic acids are NOT DETECTED.  The SARS-CoV-2 RNA is generally detectable in upper respiratory specimens during the acute phase of infection. The lowest concentration of SARS-CoV-2 viral  copies this assay can detect is 138 copies/mL. A negative result does not preclude SARS-Cov-2 infection and should not be used as the sole basis for treatment or other patient management decisions. A negative result may occur with  improper specimen collection/handling, submission of specimen other than nasopharyngeal swab, presence of viral mutation(s) within the areas targeted by this assay, and inadequate number of viral copies(<138 copies/mL).  A negative result must be combined with clinical observations, patient history, and epidemiological information. The expected result is Negative.  Fact Sheet for Patients:  BloggerCourse.com  Fact Sheet for Healthcare Providers:  SeriousBroker.it  This test is no t yet approved or cleared by the [place]  FDA and  has been authorized for detection and/or diagnosis of SARS-CoV-2 by FDA under an Emergency Use Authorization (EUA). This EUA will remain  in effect (meaning this test can be used) for the duration of the COVID-19 declaration under Section 564(b)(1) of the Act, 21 U.S.C.section 360bbb-3(b)(1), unless the authorization is terminated  or revoked sooner.       Influenza A by PCR NEGATIVE NEGATIVE Final   Influenza B by PCR NEGATIVE NEGATIVE Final    Comment: (NOTE) The Xpert Xpress SARS-CoV-2/FLU/RSV plus assay is intended as an aid in the diagnosis of influenza from Nasopharyngeal swab specimens and should not be used as a sole basis for treatment. Nasal washings and aspirates are unacceptable for Xpert Xpress SARS-CoV-2/FLU/RSV testing.  Fact Sheet for Patients: BloggerCourse.com  Fact Sheet for Healthcare Providers: SeriousBroker.it  This test is not yet approved or cleared by the [place]  FDA and has been authorized for detection and/or diagnosis of SARS-CoV-2 by FDA under an Emergency Use Authorization (EUA). This  EUA will remain in effect (meaning this test can be used) for the duration of the COVID-19 declaration under Section 564(b)(1) of the Act, 21 U.S.C. section 360bbb-3(b)(1), unless the authorization is terminated or revoked.     Resp Syncytial Virus by PCR NEGATIVE NEGATIVE Final    Comment: (NOTE) Fact Sheet for Patients: BloggerCourse.com  Fact Sheet for Healthcare Providers: SeriousBroker.it  This test is not yet approved or cleared by the [place]  FDA and has been authorized for detection and/or diagnosis of SARS-CoV-2 by FDA under an Emergency Use Authorization (EUA). This EUA will remain in effect (meaning this test can be used) for the duration of the COVID-19 declaration under Section 564(b)(1) of the Act, 21 U.S.C. section 360bbb-3(b)(1), unless the authorization is terminated or revoked.  Performed at [facility], [address] [place], [place] [address]   Culture, blood (Routine X 2) w Reflex to ID Panel     Status: None   Collection Time: 04/24/24 11:40 AM   Specimen: BLOOD  Result Value Ref Range Status   Specimen Description BLOOD BLOOD LEFT WRIST  Final   Special Requests   Final    BOTTLES DRAWN AEROBIC AND ANAEROBIC Blood Culture results may not be optimal due to an inadequate volume of blood received in culture bottles   Culture   Final    NO GROWTH 5 DAYS Performed at [facility], [address], [place], [place] [address]    Report Status 04/29/2024 FINAL  Final  MRSA Next Gen by PCR, Nasal     Status: None   Collection Time: 04/24/24  5:20 PM   Specimen: Nasal Mucosa; Nasal Swab  Result Value Ref Range Status   MRSA by PCR Next Gen NOT DETECTED NOT DETECTED Final    Comment: (NOTE) The GeneXpert MRSA Assay (FDA approved for NASAL specimens only), is one component of a comprehensive MRSA colonization surveillance program. It is not intended to diagnose MRSA infection nor to guide or monitor  treatment for MRSA infections. Test performance is not FDA approved in patients less than 45 years old. Performed at [facility], [address], [place], [place] [address]          Radiology  Studies: DG Swallowing Func-Speech Pathology Result Date: 04/29/2024 Table formatting from the original result was not included. Modified Barium Swallow Study Patient Details Name: NIHLA HOOS MRN: [id number] Date of Birth: 11-03-1941 Today's Date: 04/29/2024 HPI/PMH: HPI: CHYVONNE LUEKE is a 83 y.o. female with medical history significant for diabetes, hypertension, stroke, pituitary adenoma, PSVT, anxiety and depression.  Patient was brought to the ED from her group facility with reports of confusion that started today, also with cough, fever.   At the time of my evaluation, patient is lethargic, responds to direction but unable to answer questions or give history. Recent hospitalization 3/17 to 3/25 for acute/early subacute stroke with concomitant acute metabolic encephalopathy.  Was discharged to SNF.     Patient has been admitted with septic shock secondary to multifocal pneumonia along with associated acute hypoxemic respiratory failure and acute metabolic encephalopathy. BSE requested. Clinical Impression: Patient presents with oropharyngeal swallowing to be essentially [facility]. Note one episode of flash frank penetration of thin liquids that was cleared during the swallow and occasional premature spillage of thin liquids penetrated before the swallow but was cleared during the swallow every time. Swallow is often triggered at the posterior angle of the ramus but sometimes is delayed until bolus fills the pyriform sinuses. Pharyngeal stripping wave is slightly diminished resulting in occasional trace pharyngeal residue. Note suspected small Zenker's Diverticulum (ZD) - no radiologist present to confirm. Residue in ZD with retrograde movement back up to the pyriforms. Repeat dry swallow clears retrograde residue from  the pyriforms, however note it again collects in the ZD. No retrograde residue was penetrated or aspirated. Esophageal sweep was unremarkable. Recommend upgrade Pt's diet to D3/mech soft and continue with thin liquids. Recommend Pt utilize repeat swallow with every sip/bite to ensure trace/min retrograde residue is cleared from pharynx. Reviewed findings with patient and RN. There are no further ST needs noted at this time, our service will sign off. Factors that may increase risk of adverse event in presence of aspiration Roderick Civatte & Jessy Morocco 2021): No data recorded Recommendations/Plan: Swallowing Evaluation Recommendations Swallowing Evaluation Recommendations Recommendations: PO diet PO Diet Recommendation: Dysphagia 3 (Mechanical soft); Thin liquids (Level 0) Liquid Administration via: Cup; Straw Medication Administration: Whole meds with liquid Supervision: Patient able to self-feed Swallowing strategies  : Slow rate; Small bites/sips; Multiple dry swallows after each bite/sip Postural changes: Position pt fully upright for meals Oral care recommendations: Oral care BID (2x/day) Treatment Plan Treatment Plan Treatment recommendations: Therapy as outlined in treatment plan below Follow-up recommendations: No SLP follow up Interventions: Aspiration precaution training Recommendations Recommendations for follow up therapy are one component of a multi-disciplinary discharge planning process, led by the attending physician.  Recommendations may be updated based on patient status, additional functional criteria and insurance authorization. Assessment: Orofacial Exam: Orofacial Exam Oral Cavity: Oral Hygiene: WFL Oral Cavity - Dentition: Adequate natural dentition; Missing dentition Orofacial Anatomy: WFL Oral Motor/Sensory Function: WFL Anatomy: Anatomy: WFL Boluses Administered: Boluses Administered Boluses Administered: Thin liquids (Level 0); Mildly thick liquids (Level 2, nectar thick); Moderately thick liquids  (Level 3, honey thick); Puree; Solid  Oral Impairment Domain: Oral Impairment Domain Lip Closure: No labial escape Tongue control during bolus hold: Not tested Bolus preparation/mastication: Timely and efficient chewing and mashing Bolus transport/lingual motion: Brisk tongue motion Oral residue: Complete oral clearance Initiation of pharyngeal swallow : Posterior angle of the ramus; Pyriform sinuses  Pharyngeal Impairment Domain: Pharyngeal Impairment Domain Soft palate elevation: No bolus between soft palate (SP)/pharyngeal wall (PW) Laryngeal  elevation: Complete superior movement of thyroid  cartilage with complete approximation of arytenoids to epiglottic petiole Anterior hyoid excursion: Complete anterior movement Epiglottic movement: Complete inversion Laryngeal vestibule closure: Complete, no air/contrast in laryngeal vestibule; Incomplete, narrow column air/contrast in laryngeal vestibule Pharyngeal stripping wave : Present - complete Pharyngeal contraction (A/P view only): N/A Pharyngoesophageal segment opening: Complete distension and complete duration, no obstruction of flow Tongue base retraction: No contrast between tongue base and posterior pharyngeal wall (PPW) Pharyngeal residue: Trace residue within or on pharyngeal structures Location of pharyngeal residue: Tongue base; Valleculae  Esophageal Impairment Domain: Esophageal Impairment Domain Esophageal clearance upright position: Esophageal retention with retrograde flow below pharyngoesophageal segment (PES) (small Zenker's Diverticulum) Pill: Pill Consistency administered: Thin liquids (Level 0) Thin liquids (Level 0): [facility] Penetration/Aspiration Scale Score: Penetration/Aspiration Scale Score 1.  Material does not enter airway: Thin liquids (Level 0); Mildly thick liquids (Level 2, nectar thick); Moderately thick liquids (Level 3, honey thick); Solid; Puree; Pill 2.  Material enters airway, remains ABOVE vocal cords then ejected out: Thin liquids  (Level 0) Compensatory Strategies: Compensatory Strategies Compensatory strategies: Yes Multiple swallows: Effective   General Information: Caregiver present: No  Diet Prior to this Study: Dysphagia 2 (finely chopped); Thin liquids (Level 0)   Temperature : Normal   Respiratory Status: WFL   Supplemental O2: Nasal cannula   History of Recent Intubation: No  Behavior/Cognition: Alert; Cooperative; Pleasant mood; Confused Self-Feeding Abilities: Able to self-feed Baseline vocal quality/speech: Normal Volitional Cough: Able to elicit Volitional Swallow: Able to elicit No data recorded Goal Planning: No data recorded No data recorded No data recorded No data recorded No data recorded Pain: Pain Assessment Pain Assessment: No/denies pain End of Session: Start Time:SLP Start Time (ACUTE ONLY): 1121 Stop Time: SLP Stop Time (ACUTE ONLY): 1154 Time Calculation:SLP Time Calculation (min) (ACUTE ONLY): 33 min Charges: SLP Evaluations $ SLP Speech Visit: 1 Visit SLP Evaluations $MBS Swallow: 1 Procedure SLP visit diagnosis: SLP Visit Diagnosis: Dysphagia, unspecified (R13.10) Past Medical History: Past Medical History: Diagnosis Date  Anxiety   Asthma   ASYMPTOMATIC POSTMENOPAUSAL STATUS 02/09/2009  Cataract   Constipation 03/19/2013  Depression   DIABETES MELLITUS, TYPE II 09/14/2007  HYPERCHOLESTEROLEMIA 02/09/2009  HYPERTENSION 02/09/2009  HYPOTHYROIDISM 09/14/2007  MIGRAINE HEADACHE 09/14/2007  Neuropathy   OSTEOPOROSIS 02/09/2009  PANCREATITIS 09/14/2007  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  PONV (postoperative nausea and vomiting)   Rectocele 03/19/2013  Shortness of breath   SUPERFICIAL PHLEBITIS 09/14/2007  Varicose veins  Past Surgical History: Past Surgical History: Procedure Laterality Date  ABDOMINAL HYSTERECTOMY    ANTERIOR AND POSTERIOR REPAIR N/A 04/02/2013  Procedure: ANTERIOR (CYSTOCELE) AND POSTERIOR REPAIR (RECTOCELE);  Surgeon: Albino Hum, MD;  Location: [facility];  Service: Gynecology;  Laterality: N/A;  APPENDECTOMY     BRAIN SURGERY    CATARACT EXTRACTION W/PHACO  12/05/2011  Procedure: CATARACT EXTRACTION PHACO AND INTRAOCULAR LENS PLACEMENT (IOC);  Surgeon: Anner Kill;  Location: [facility];  Service: Ophthalmology;  Laterality: Left;  CDE:9.65  CHOLECYSTECTOMY    CRANIOTOMY N/A 06/04/2020  Procedure: Endoscopic Transphenoidal Resection of Recurrent PituitaryTumor;  Surgeon: Elna Haggis, MD;  Location: [facility];  Service: Neurosurgery;  Laterality: N/A;  CYSTOSCOPY    ENDOVENOUS ABLATION SAPHENOUS VEIN W/ LASER  11-03-2011   right greater saphenous vein  left leg done 10-2011  EYE SURGERY  98  right cataract extraction 98  LAPAROSCOPIC NISSEN FUNDOPLICATION    NM ESOPHAGEAL REFLUX  08-11-11  PITUITARY EXCISION  10/1997  POSTERIOR REPAIR    TRANSNASAL APPROACH  N/A 06/04/2020  Procedure: TRANSNASAL APPROACH;  Surgeon: Ammon Bales, MD;  Location: [facility];  Service: ENT;  Laterality: N/A;  TRANSPHENOIDAL / TRANSNASAL HYPOPHYSECTOMY / RESECTION PITUITARY TUMOR  08-11-11 Florina Husbands 04/29/2024, 12:42 PM       Scheduled Meds:  acetylcysteine   4 mL Nebulization TID   arformoterol  15 mcg Nebulization BID   atorvastatin   40 mg Oral Daily   budesonide (PULMICORT) nebulizer solution  0.5 mg Nebulization BID   Chlorhexidine  Gluconate Cloth  6 each Topical Daily   clopidogrel   75 mg Oral Daily   cycloSPORINE   1 drop Both Eyes BID   enoxaparin  (LOVENOX ) injection  40 mg Subcutaneous Q24H   feeding supplement  237 mL Oral BID BM   furosemide   40 mg Intravenous BID   insulin  aspart  0-5 Units Subcutaneous QHS   insulin  aspart  0-9 Units Subcutaneous TID WC   insulin  aspart  5 Units Subcutaneous TID WC   ipratropium-albuterol   3 mL Nebulization TID   levothyroxine   50 mcg Oral Q0600   methylPREDNISolone  (SOLU-MEDROL ) injection  40 mg Intravenous Q12H   mirabegron  ER  25 mg Oral Daily   pregabalin   150 mg Oral BID   venlafaxine  XR  150 mg Oral [id number]   vortioxetine  HBr  10 mg Oral Daily      LOS: 6 days    Total  care time spent: 55 minutes    Shawan Corella Loran Rock, DO [facility]  If 7PM-7AM, please contact night-coverage [email or web address] 04/30/2024, 11:54 AM

## 2024-04-30 NOTE — Inpatient Diabetes Management (Signed)
 Inpatient Diabetes Program Recommendations  AACE/ADA: New Consensus Statement on Inpatient Glycemic Control (2015)  Target Ranges:  Prepandial:   less than 140 mg/dL      Peak postprandial:   less than 180 mg/dL (1-2 hours)      Critically ill patients:  140 - 180 mg/dL   Lab Results  Component Value Date   GLUCAP 265 (H) 04/30/2024   HGBA1C 7.0 (H) 04/25/2024    Review of Glycemic Control  Latest Reference Range & Units 04/29/24 07:27 04/29/24 11:45 04/29/24 16:47 04/29/24 21:04 04/30/24 07:32  Glucose-Capillary 70 - 99 mg/dL 161 (H) 096 (H) 045 (H) 348 (H) 265 (H)  (H): Data is abnormally high Diabetes history: DM2 Outpatient Diabetes medications: Metformin  500 mg BID Current orders for Inpatient glycemic control: Novolog  0-9 units TID with meals, Novolog  3 units TID, Novolog  0-5 units at bedtime; Solumedrol 40 mg Q12H   Inpatient Diabetes Program Recommendations:     Insulin : If steroids continued as ordered, please consider ordering Novolog  5 units TID with meals for meal coverage if patient eats at least 50% of meals.  Thanks, Marjo Sievert, MSN, RNC-OB Diabetes Coordinator [phone] (8a-5p)

## 2024-04-30 NOTE — Plan of Care (Signed)
  Problem: Education: Goal: Knowledge of General Education information will improve Description: Including pain rating scale, medication(s)/side effects and non-pharmacologic comfort measures Outcome: Progressing   Problem: Health Behavior/Discharge Planning: Goal: Ability to manage health-related needs will improve Outcome: Progressing   Problem: Nutrition: Goal: Adequate nutrition will be maintained Outcome: Progressing   Problem: Coping: Goal: Level of anxiety will decrease Outcome: Progressing   Problem: Activity: Goal: Risk for activity intolerance will decrease Outcome: Not Progressing

## 2024-04-30 NOTE — Progress Notes (Signed)
 SATURATION QUALIFICATIONS: (This note is used to comply with regulatory documentation for home oxygen)  Patient Saturations on Room Air at Rest = 99%  Patient Saturations on Room Air while Ambulating = 88%  Patient Saturations on 2 Liters of oxygen while Ambulating = 93%

## 2024-04-30 NOTE — Inpatient Diabetes Management (Signed)
 Inpatient Diabetes Program Recommendations  AACE/ADA: New Consensus Statement on Inpatient Glycemic Control (2015)  Target Ranges:  Prepandial:   less than 140 mg/dL      Peak postprandial:   less than 180 mg/dL (1-2 hours)      Critically ill patients:  140 - 180 mg/dL   Lab Results  Component Value Date   GLUCAP 367 (H) 04/30/2024   HGBA1C 7.0 (H) 04/25/2024    Review of Glycemic Control  Latest Reference Range & Units 04/29/24 07:27 04/29/24 11:45 04/29/24 16:47 04/29/24 21:04 04/30/24 07:32 04/30/24 11:35  Glucose-Capillary 70 - 99 mg/dL 161 (H) 096 (H) 045 (H) 348 (H) 265 (H) 367 (H)  (H): Data is abnormally high  Diabetes history: DM2 Outpatient Diabetes medications: Metformin  500 mg BID Current orders for Inpatient glycemic control: Novolog  0-9 units TID with meals, Novolog  0-5 units at bedtime; Novolog  5 units TID; Solumedrol 40 mg Q12H  Inpatient Diabetes Program Recommendations:    Noted meal coverage to increase this evening to Novolog  5 units TID .  Might also consider:  Semglee 8 units every day.    Will continue to follow while inpatient.  Thank you, Hays Lipschutz, MSN, CDCES Diabetes Coordinator Inpatient Diabetes Program [phone] (team pager from 8a-5p)

## 2024-05-01 DIAGNOSIS — A419 Sepsis, unspecified organism: Secondary | ICD-10-CM | POA: Diagnosis not present

## 2024-05-01 DIAGNOSIS — E039 Hypothyroidism, unspecified: Secondary | ICD-10-CM | POA: Diagnosis not present

## 2024-05-01 DIAGNOSIS — G9341 Metabolic encephalopathy: Secondary | ICD-10-CM | POA: Diagnosis not present

## 2024-05-01 LAB — CBC
HCT: 39.7 % (ref 36.0–46.0)
Hemoglobin: 13.4 g/dL (ref 12.0–15.0)
MCH: 29.5 pg (ref 26.0–34.0)
MCHC: 33.8 g/dL (ref 30.0–36.0)
MCV: 87.3 fL (ref 80.0–100.0)
Platelets: 356 10*3/uL (ref 150–400)
RBC: 4.55 MIL/uL (ref 3.87–5.11)
RDW: 15.2 % (ref 11.5–15.5)
WBC: 15.3 10*3/uL — ABNORMAL HIGH (ref 4.0–10.5)
nRBC: 0 % (ref 0.0–0.2)

## 2024-05-01 LAB — BASIC METABOLIC PANEL WITH GFR
Anion gap: 11 (ref 5–15)
BUN: 21 mg/dL (ref 8–23)
CO2: 31 mmol/L (ref 22–32)
Calcium: 8.7 mg/dL — ABNORMAL LOW (ref 8.9–10.3)
Chloride: 90 mmol/L — ABNORMAL LOW (ref 98–111)
Creatinine, Ser: 0.77 mg/dL (ref 0.44–1.00)
GFR, Estimated: >60 mL/min
Glucose, Bld: 328 mg/dL — ABNORMAL HIGH (ref 70–99)
Potassium: 4.2 mmol/L (ref 3.5–5.1)
Sodium: 132 mmol/L — ABNORMAL LOW (ref 135–145)

## 2024-05-01 LAB — GLUCOSE, CAPILLARY
Glucose-Capillary: 264 mg/dL — ABNORMAL HIGH (ref 70–99)
Glucose-Capillary: 384 mg/dL — ABNORMAL HIGH (ref 70–99)
Glucose-Capillary: 402 mg/dL — ABNORMAL HIGH (ref 70–99)

## 2024-05-01 LAB — MAGNESIUM: Magnesium: 1.6 mg/dL — ABNORMAL LOW (ref 1.7–2.4)

## 2024-05-01 MED ORDER — POLYETHYLENE GLYCOL 3350 17 G PO PACK: 17.0000 g | PACK | Freq: Every day | ORAL | 0 refills | Status: DC | PRN

## 2024-05-01 MED ORDER — PREDNISONE 20 MG PO TABS: 40.0000 mg | ORAL_TABLET | Freq: Every day | ORAL | 0 refills | Status: AC

## 2024-05-01 MED ORDER — DEXTROMETHORPHAN POLISTIREX ER 30 MG/5ML PO SUER: 30.0000 mg | Freq: Two times a day (BID) | ORAL | Status: DC | PRN

## 2024-05-01 MED ORDER — FUROSEMIDE 20 MG PO TABS: 20.0000 mg | ORAL_TABLET | Freq: Every day | ORAL | 0 refills | Status: DC

## 2024-05-01 MED ORDER — IPRATROPIUM-ALBUTEROL 0.5-2.5 (3) MG/3ML IN SOLN: 3.0000 mL | RESPIRATORY_TRACT | 1 refills | Status: DC | PRN

## 2024-05-01 MED ORDER — IPRATROPIUM-ALBUTEROL 0.5-2.5 (3) MG/3ML IN SOLN: 3.0000 mL | Freq: Two times a day (BID) | RESPIRATORY_TRACT | Status: DC

## 2024-05-01 MED ORDER — ALBUTEROL SULFATE HFA 108 (90 BASE) MCG/ACT IN AERS: 2.0000 | INHALATION_SPRAY | RESPIRATORY_TRACT | 3 refills | Status: DC | PRN

## 2024-05-01 MED ORDER — INSULIN ASPART 100 UNIT/ML IJ SOLN
8.0000 [IU] | Freq: Three times a day (TID) | INTRAMUSCULAR | Status: DC
  Administered 2024-05-01: 8 [IU] via SUBCUTANEOUS

## 2024-05-01 MED ORDER — DEXTROMETHORPHAN POLISTIREX ER 30 MG/5ML PO SUER: 30.0000 mg | Freq: Two times a day (BID) | ORAL | 0 refills | Status: DC | PRN

## 2024-05-01 MED ORDER — POTASSIUM CHLORIDE CRYS ER 10 MEQ PO TBCR: 10.0000 meq | EXTENDED_RELEASE_TABLET | Freq: Every day | ORAL | 0 refills | Status: DC

## 2024-05-01 MED ORDER — PREDNISONE 20 MG PO TABS: 40.0000 mg | ORAL_TABLET | Freq: Every day | ORAL | Status: DC

## 2024-05-01 MED ORDER — GUAIFENESIN ER 600 MG PO TB12
600.0000 mg | ORAL_TABLET | Freq: Two times a day (BID) | ORAL | Status: DC
Start: 2024-05-01 — End: 2024-05-01

## 2024-05-01 MED ORDER — GUAIFENESIN ER 600 MG PO TB12
600.0000 mg | ORAL_TABLET | Freq: Two times a day (BID) | ORAL | 0 refills | Status: AC
Start: 2024-05-01 — End: 2024-05-04

## 2024-05-01 MED ORDER — POLYETHYLENE GLYCOL 3350 17 G PO PACK: 17.0000 g | PACK | Freq: Every day | ORAL | 0 refills | Status: AC | PRN

## 2024-05-01 MED ORDER — MAGNESIUM SULFATE 2 GM/50ML IV SOLN
2.0000 g | Freq: Once | INTRAVENOUS | Status: AC
  Administered 2024-05-01: 2 g via INTRAVENOUS
  Filled 2024-05-01: qty 50

## 2024-05-01 MED ORDER — PREDNISONE 20 MG PO TABS: 40.0000 mg | ORAL_TABLET | Freq: Every day | ORAL | 0 refills | Status: DC

## 2024-05-01 NOTE — Care Management Important Message (Signed)
 Important Message  Patient Details  Name: [name] White MRN: [id number] Date of Birth: Jan 08, 1941   Important Message Given:  Other (see comment) (l/m at [phone] for son [name] to return call,. copy mailed to address on file.)     Mouna Yager L Merrilyn Legler 05/01/2024, 3:06 PM

## 2024-05-01 NOTE — Care Management Important Message (Signed)
 Important Message  Patient Details  Name: [name] White MRN: [id number] Date of Birth: 1941-03-27   Important Message Given:  Other (see comment) (l/m at [phone] for son [name] to return call)     Neila Bally 05/01/2024, 10:43 AM

## 2024-05-01 NOTE — TOC Transition Note (Signed)
 Transition of Care [facility]) - Discharge Note   Patient Details  Name: [name] White MRN: [id number] Date of Birth: 03/01/1941  Transition of Care [facility]) CM/SW Contact:  Orelia Binet, RN Phone Number: 05/01/2024, 12:08 PM   Clinical Narrative:   Patient discharging back to [facility]. DC summary and FL2 faxed for Lamond Pilot to review. Family will be here at 12:30 to transport.    Final next level of care: Assisted Living Barriers to Discharge: Barriers Resolved   Patient Goals and CMS Choice Patient states their goals for this hospitalization and ongoing recovery are:: Pending work up for discharge plan CMS Medicare.gov Compare Post Acute Care list provided to:: Patient Represenative (must comment) Choice offered to / list presented to : Adult Children     Discharge Placement               Patient to be transferred to facility by: Family   Patient and family notified of of transfer: 05/01/24  Discharge Plan and Services Additional resources added to the After Visit Summary for           Social Drivers of Health (SDOH) Interventions SDOH Screenings   Food Insecurity: No Food Insecurity (04/24/2024)  Housing: Low Risk  (04/24/2024)  Transportation Needs: No Transportation Needs (04/24/2024)  Utilities: Not At Risk (04/24/2024)  Depression (PHQ2-9): Low Risk  (01/28/2020)  Social Connections: Socially Isolated (04/24/2024)  Tobacco Use: Low Risk  (04/24/2024)     Readmission Risk Interventions    04/25/2024    1:46 PM 12/27/2023    1:21 PM  Readmission Risk Prevention Plan  Transportation Screening Complete Complete  PCP or Specialist Appt within 3-5 Days Not Complete   Home Care Screening  Complete  Medication Review (RN CM)  Complete  HRI or Home Care Consult Complete   Social Work Consult for Recovery Care Planning/Counseling Complete   Palliative Care Screening Not Complete   Medication Review Oceanographer) Complete

## 2024-05-01 NOTE — Discharge Instructions (Signed)
 IMPORTANT INFORMATION: PAY CLOSE ATTENTION   PHYSICIAN DISCHARGE INSTRUCTIONS  Follow with Primary care provider  Benita Stabile, MD  and other consultants as instructed by your Hospitalist Physician  SEEK MEDICAL CARE OR RETURN TO EMERGENCY ROOM IF SYMPTOMS COME BACK, WORSEN OR NEW PROBLEM DEVELOPS   Please note: You were cared for by a hospitalist during your hospital stay. Every effort will be made to forward records to your primary care provider.  You can request that your primary care provider send for your hospital records if they have not received them.  Once you are discharged, your primary care physician will handle any further medical issues. Please note that NO REFILLS for any discharge medications will be authorized once you are discharged, as it is imperative that you return to your primary care physician (or establish a relationship with a primary care physician if you do not have one) for your post hospital discharge needs so that they can reassess your need for medications and monitor your lab values.  Please get a complete blood count and chemistry panel checked by your Primary MD at your next visit, and again as instructed by your Primary MD.  Get Medicines reviewed and adjusted: Please take all your medications with you for your next visit with your Primary MD  Laboratory/radiological data: Please request your Primary MD to go over all hospital tests and procedure/radiological results at the follow up, please ask your primary care provider to get all Hospital records sent to his/her office.  In some cases, they will be blood work, cultures and biopsy results pending at the time of your discharge. Please request that your primary care provider follow up on these results.  If you are diabetic, please bring your blood sugar readings with you to your follow up appointment with primary care.    Please call and make your follow up appointments as soon as possible.    Also Note the  following: If you experience worsening of your admission symptoms, develop shortness of breath, life threatening emergency, suicidal or homicidal thoughts you must seek medical attention immediately by calling 911 or calling your MD immediately  if symptoms less severe.  You must read complete instructions/literature along with all the possible adverse reactions/side effects for all the Medicines you take and that have been prescribed to you. Take any new Medicines after you have completely understood and accpet all the possible adverse reactions/side effects.   Do not drive when taking Pain medications or sleeping medications (Benzodiazepines)  Do not take more than prescribed Pain, Sleep and Anxiety Medications. It is not advisable to combine anxiety,sleep and pain medications without talking with your primary care practitioner  Special Instructions: If you have smoked or chewed Tobacco  in the last 2 yrs please stop smoking, stop any regular Alcohol  and or any Recreational drug use.  Wear Seat belts while driving.  Do not drive if taking any narcotic, mind altering or controlled substances or recreational drugs or alcohol.

## 2024-05-01 NOTE — [place] FL2 (Signed)
 [place]  MEDICAID FL2 LEVEL OF CARE FORM     IDENTIFICATION  Patient Name: [name] White Birthdate: December 01, 1941 Sex: female Admission Date (Current Location): 04/24/2024  [facility] and [place] Number:  [employer] and Address:  [facility],  [address] [name] [address]      Provider Number: [id number]  Attending Physician Name and Address:  Rayfield Cairo, MD  Relative Name and Phone Number:  Carlen Chasten (Daughter)  [phone]    Current Level of Care: Hospital Recommended Level of Care: Assisted Living Facility Prior Approval Number:    Date Approved/Denied:   PASRR Number:    Discharge Plan: Domiciliary (Rest home) (ALF)    Current Diagnoses: Patient Active Problem List   Diagnosis Date Noted   Hypomagnesemia 05/01/2024   Septic shock (HCC) 04/24/2024   Multifocal pneumonia 04/24/2024   Acute respiratory failure with hypoxia (HCC) 04/24/2024   CVA (cerebral vascular accident) (HCC) 03/12/2024   Acute lower UTI 03/12/2024   Type 2 diabetes mellitus with peripheral neuropathy (HCC) 03/12/2024   Anxiety and depression 03/12/2024   GERD without esophagitis 03/12/2024   Acute encephalopathy 03/11/2024   Rhabdomyolysis 12/27/2023   Pressure injury of skin 12/27/2023   UTI (urinary tract infection) 12/26/2023   Sepsis due to urinary tract infection (HCC) 12/26/2023   Acute metabolic encephalopathy 12/26/2023   Status post transsphenoidal pituitary resection (HCC) 06/04/2020   Pituitary adenoma with extrasellar extension (HCC) 06/04/2020   Pituitary tumor 05/29/2020   Dizzinesses 02/02/2020   Acute pain of right shoulder 02/02/2020   Encounter for screening mammogram for malignant neoplasm of breast 01/28/2020   Vitamin D  deficiency 01/28/2020   Paroxysmal supraventricular tachycardia (HCC) 06/19/2014   Rectocele 03/19/2013   Constipation 03/19/2013   HYPERCHOLESTEROLEMIA 02/09/2009   Essential hypertension 02/09/2009    Osteoporosis 02/09/2009   Post-menopausal 02/09/2009   HEAT INTOLERANCE 02/01/2008   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   Hypothyroidism 09/14/2007   Diabetes (HCC) 09/14/2007   Migraine headache 09/14/2007   SUPERFICIAL PHLEBITIS 09/14/2007   PANCREATITIS 09/14/2007   MENOPAUSAL SYNDROME 09/14/2007   FATIGUE 09/14/2007    Orientation RESPIRATION BLADDER Height & Weight     Self, Time, Situation, Place  Normal Incontinent Weight:  (bed scale is not working) Height:  5\' 5"  (165.1 cm)  BEHAVIORAL SYMPTOMS/MOOD NEUROLOGICAL BOWEL NUTRITION STATUS      Continent Diet (regular)  AMBULATORY STATUS COMMUNICATION OF NEEDS Skin   Limited Assist Verbally Skin abrasions                       Personal Care Assistance Level of Assistance  Bathing, Feeding, Dressing Bathing Assistance: Limited assistance Feeding assistance: Independent Dressing Assistance: Limited assistance     Functional Limitations Info  Sight, Speech, Hearing Sight Info: Impaired Hearing Info: Impaired Speech Info: Adequate    SPECIAL CARE FACTORS FREQUENCY  PT (By licensed PT)     PT Frequency: 2- 3 times a week with [facility]              Contractures Contractures Info: Not present    Additional Factors Info  Code Status, Allergies Code Status Info: DNR- Limited Allergies Info: Betadine, Penicillins           Current Medications (05/01/2024):  This is the current hospital active medication list Current Facility-Administered Medications  Medication Dose Route Frequency Provider Last Rate Last Admin   acetaminophen  (TYLENOL ) tablet 650 mg  650 mg Oral Q6H PRN Justina Oman, MD  650 mg at 04/30/24 1921   Or   acetaminophen  (TYLENOL ) suppository 650 mg  650 mg Rectal Q6H PRN Justina Oman, MD       acetylcysteine  (MUCOMYST ) 20 % nebulizer / oral solution 4 mL  4 mL Nebulization TID Justina Oman, MD   4 mL at 05/01/24 0717   albuterol  (PROVENTIL ) (2.5 MG/3ML) 0.083% nebulizer solution  2.5 mg  2.5 mg Nebulization Q2H PRN Justina Oman, MD   2.5 mg at 04/26/24 0842   arformoterol (BROVANA) nebulizer solution 15 mcg  15 mcg Nebulization BID Justina Oman, MD   15 mcg at 05/01/24 1308   atorvastatin  (LIPITOR) tablet 40 mg  40 mg Oral Daily Justina Oman, MD   40 mg at 05/01/24 0912   budesonide (PULMICORT) nebulizer solution 0.5 mg  0.5 mg Nebulization BID Justina Oman, MD   0.5 mg at 05/01/24 6578   Chlorhexidine  Gluconate Cloth 2 % PADS 6 each  6 each Topical Daily Justina Oman, MD   6 each at 05/01/24 0912   clopidogrel  (PLAVIX ) tablet 75 mg  75 mg Oral Daily Justina Oman, MD   75 mg at 05/01/24 4696   cycloSPORINE  (RESTASIS ) 0.05 % ophthalmic emulsion 1 drop  1 drop Both Eyes BID Justina Oman, MD   1 drop at 05/01/24 0912   enoxaparin  (LOVENOX ) injection 40 mg  40 mg Subcutaneous Q24H Justina Oman, MD   40 mg at 04/30/24 2139   feeding supplement (ENSURE ENLIVE / ENSURE PLUS) liquid 237 mL  237 mL Oral BID BM Justina Oman, MD   237 mL at 05/01/24 0912   furosemide  (LASIX ) injection 40 mg  40 mg Intravenous BID Shah, Pratik D, DO   40 mg at 05/01/24 2952   insulin  aspart (novoLOG ) injection 0-5 Units  0-5 Units Subcutaneous QHS Justina Oman, MD   1 Units at 04/30/24 2141   insulin  aspart (novoLOG ) injection 0-9 Units  0-9 Units Subcutaneous TID WC Justina Oman, MD   5 Units at 05/01/24 0810   insulin  aspart (novoLOG ) injection 8 Units  8 Units Subcutaneous TID WC Johnson, Clanford L, MD       ipratropium-albuterol  (DUONEB) 0.5-2.5 (3) MG/3ML nebulizer solution 3 mL  3 mL Nebulization TID Justina Oman, MD   3 mL at 05/01/24 0717   levothyroxine  (SYNTHROID ) tablet 50 mcg  50 mcg Oral Q0600 Justina Oman, MD   50 mcg at 05/01/24 0538   LORazepam  (ATIVAN ) injection 0.5 mg  0.5 mg Intravenous Q6H PRN Justina Oman, MD   0.5 mg at 04/27/24 2110   magnesium  sulfate IVPB 2 g 50 mL  2 g Intravenous Once Johnson, Clanford L, MD 50 mL/hr at 05/01/24 1013 2 g at 05/01/24  1013   mirabegron  ER (MYRBETRIQ ) tablet 25 mg  25 mg Oral Daily Justina Oman, MD   25 mg at 05/01/24 8413   ondansetron  (ZOFRAN ) tablet 4 mg  4 mg Oral Q6H PRN Justina Oman, MD       Or   ondansetron  (ZOFRAN ) injection 4 mg  4 mg Intravenous Q6H PRN Justina Oman, MD       polyethylene glycol (MIRALAX  / GLYCOLAX ) packet 17 g  17 g Oral Daily PRN Justina Oman, MD       Cecily Cohen ON 05/02/2024] predniSONE (DELTASONE) tablet 40 mg  40 mg Oral Q breakfast Johnson, Clanford L, MD       pregabalin  (LYRICA ) capsule 150 mg  150 mg Oral BID Justina Oman, MD   150 mg at 05/01/24  0981   venlafaxine  XR (EFFEXOR -XR) 24 hr capsule 150 mg  150 mg Oral Shareen Daub, Constantino Demark, MD   150 mg at 05/01/24 0600   vortioxetine  HBr (TRINTELLIX ) tablet 10 mg  10 mg Oral Daily Justina Oman, MD   10 mg at 05/01/24 1914     Discharge Medications: Allergies as of 05/01/2024       Reactions   Betadine [povidone Iodine] Hives   Penicillins Rash        Medication List     STOP taking these medications    carvedilol  3.125 MG tablet Commonly known as: COREG    nitrofurantoin (macrocrystal-monohydrate) 100 MG capsule Commonly known as: MACROBID       TAKE these medications    acetaminophen  325 MG tablet Commonly known as: TYLENOL  Take 2 tablets (650 mg total) by mouth every 6 (six) hours as needed for mild pain (pain score 1-3) (or Fever >/= 101).   albuterol  108 (90 Base) MCG/ACT inhaler Commonly known as: VENTOLIN  HFA Inhale 2 puffs into the lungs every 4 (four) hours as needed for wheezing or shortness of breath.   aspirin  EC 81 MG tablet Take 81 mg by mouth daily. Swallow whole.   atorvastatin  40 MG tablet Commonly known as: LIPITOR Take 1 tablet (40 mg total) by mouth daily.   baclofen  10 MG tablet Commonly known as: LIORESAL  Take 10 mg by mouth at bedtime.   clopidogrel  75 MG tablet Commonly known as: PLAVIX  Take 1 tablet (75 mg total) by mouth daily. take Aspirin  81 mg daily along  with Plavix  75 mg daily for 21 days then after that STOP the Aspirin   and continue ONLY Plavix  75 mg daily indefinitely--for secondary stroke Prevention (Per The multicenter SAMMPRIS trial)   cycloSPORINE  0.05 % ophthalmic emulsion Commonly known as: RESTASIS  Place 1 drop into both eyes 2 (two) times daily.   dextromethorphan  30 MG/5ML liquid Commonly known as: Delsym  Take 5 mLs (30 mg total) by mouth 2 (two) times daily as needed for cough.   fluticasone  furoate-vilanterol 200-25 MCG/ACT Aepb Commonly known as: Breo Ellipta  Inhale 1 puff into the lungs daily.   furosemide  20 MG tablet Commonly known as: Lasix  Take 1 tablet (20 mg total) by mouth daily. Start taking on: May 03, 2024   Gemtesa 75 MG Tabs Generic drug: Vibegron Take 75 mg by mouth at bedtime.   guaiFENesin  600 MG 12 hr tablet Commonly known as: Mucinex  Take 1 tablet (600 mg total) by mouth 2 (two) times daily for 3 days.   ipratropium-albuterol  0.5-2.5 (3) MG/3ML Soln Commonly known as: DUONEB Take 3 mLs by nebulization every 4 (four) hours as needed (wheezing, shortness of breath).   levothyroxine  50 MCG tablet Commonly known as: SYNTHROID  Take 50 mcg by mouth daily.   metFORMIN  500 MG tablet Commonly known as: GLUCOPHAGE  Take 500 mg by mouth 2 (two) times daily.   multivitamins ther. w/minerals Tabs tablet Take 1 tablet by mouth every morning.   omeprazole 20 MG capsule Commonly known as: PRILOSEC Take 20 mg by mouth every morning.   polyethylene glycol 17 g packet Commonly known as: MIRALAX  / GLYCOLAX  Take 17 g by mouth daily as needed for mild constipation.   potassium chloride  10 MEQ tablet Commonly known as: KLOR-CON  M Take 1 tablet (10 mEq total) by mouth daily. Start taking on: May 03, 2024   predniSONE 20 MG tablet Commonly known as: DELTASONE Take 2 tablets (40 mg total) by mouth daily with breakfast for 5 days. Start  taking on: May 02, 2024   pregabalin  150 MG capsule Commonly known  as: LYRICA  Take 1 capsule (150 mg total) by mouth 2 (two) times daily.   rizatriptan  10 MG tablet Commonly known as: MAXALT  Take 10 mg by mouth every 2 (two) hours as needed for migraine. No more than 2 doses in 24 hours   senna-docusate 8.6-50 MG tablet Commonly known as: Senokot-S Take 2 tablets by mouth at bedtime.   traZODone  50 MG tablet Commonly known as: DESYREL  Take 1 tablet (50 mg total) by mouth at bedtime as needed for sleep.   Trintellix  10 MG Tabs tablet Generic drug: vortioxetine  HBr Take 10 mg by mouth daily.   venlafaxine  XR 150 MG 24 hr capsule Commonly known as: EFFEXOR -XR Take 150 mg by mouth every morning.        Relevant Imaging Results:  Relevant Lab Results:   Additional Information SS# [id number]  Orelia Binet, RN

## 2024-05-01 NOTE — Discharge Summary (Signed)
 Physician Discharge Summary  JENNARAE KNECHTEL WNU:[id number] DOB: 01/19/1941 DOA: 04/24/2024  PCP: Omie Bickers, MD  Admit date: 04/24/2024 Discharge date: 05/01/2024  Admitted From:  [place]  Disposition:  [place]   Recommendations for Outpatient Follow-up:  Follow up with PCP in 1 weeks Please obtain BMP in one week Please monitor blood sugars closely while on steroids  Home Health:  ALF   Discharge Condition: STABLE   CODE STATUS: DNR DIET:  Heart Healthy carb modified, low sodium foods recommended    Brief Hospitalization Summary: Please see all hospital notes, images, labs for full details of the hospitalization. Admission provider HPI:  83 y.o. female with medical history significant for diabetes, hypertension, stroke, pituitary adenoma, PSVT, anxiety and depression. Patient was brought to the ED from her group facility with reports of confusion that started today, also with cough, fever.   At the time of my evaluation, patient is lethargic, responds to direction but unable to answer questions or give history.  Patient's son [name]) is at bedside.    Recent hospitalization 3/17 to 3/25 for acute/early subacute stroke with concomitant acute metabolic encephalopathy.    Patient has been admitted with septic shock secondary to multifocal pneumonia along with associated acute hypoxemic respiratory failure and acute metabolic encephalopathy.  She continues to have some ongoing hypoxemia requiring further use of IV steroids and some further IV diuresis.  She is noted to have ongoing shortness of breath with exertion even though she has been weaned off of oxygen.   Hospital Course by listed problems addressed   Septic shock -resolved Septic shock secondary to multifocal pneumonia.  Meeting severe sepsis criteria with fever of 101.1, tachypnea, respiratory rate 16-25, hypotension, blood pressure down to 77/48, now improved on pressors.  Lactic acid 1.6 > 2.  Days of endorgan  dysfunction -acute respiratory failure, altered mental status. UA not suggestive of UTI. -Vital signs has remained stable  -treated with IV diuresis due to dyspnea with exertion and that has resolved now -IV antibiotic course completed and therefore discontinued   Acute respiratory failure with hypoxia -now improved back to baseline O2 sats down to 80%, currently on 2-3 L.  Sats greater than 93%.  ABG showed pH of 7.4, pCO2 of 47.  Likely secondary to multifocal pneumonia, vascular congestion from fluid resuscitation and COPD exacerbation.. -improved after IV diuresis, followed daily weights and low-sodium diet -Will continue steroids, bronchodilator management, DC home on oral prednisone x 5 more days  -Weaned off supplemental oxygen to room air   Multifocal pneumonia Multifocal pneumonia with septic shock and acute hypoxic respiratory failure. CT chest shows multifocal pneumonia.  COVID influenza RSV negative.  MRSA negative. - Completed IV antibiotic course.   Acute metabolic encephalopathy-resolved Likely secondary to septic shock, multifocal pneumonia.  Patient confused and lethargic.  Head CT negative for acute abnormality. -Management per above - Continue resuming psychoactive medications as per prior to admission regimen. - Constant reorientation, supportive care will be provided.   CVA (cerebral vascular accident) -Continue Plavix  for secondary prevention - No acute neurologic deficit appreciated.   Anxiety and depression -Continue resuming psychoactive medications as per home regimen - Patient following commands appropriately and oriented x 3.   Essential hypertension-controlled -Hypotension resolved, continue to hold antihypertensives while on IV diuresis with Lasix  - Follow vital signs.   Diabetes-with steroid-induced hyperglycemia - HgbA1c 7.0%. - treated with sliding scale insulin  - Follow CBGs fluctuation. -Appreciate diabetes coordinators    Hypothyroidism -Continue Synthroid .  Discharge Diagnoses:  Principal Problem:   Septic shock (HCC) Active Problems:   Hypothyroidism   Diabetes (HCC)   Essential hypertension   Acute metabolic encephalopathy   Pressure injury of skin   CVA (cerebral vascular accident) (HCC)   Anxiety and depression   Multifocal pneumonia   Acute respiratory failure with hypoxia (HCC)   Hypomagnesemia   Discharge Instructions:  Allergies as of 05/01/2024       Reactions   Betadine [povidone Iodine] Hives   Penicillins Rash        Medication List     STOP taking these medications    carvedilol  3.125 MG tablet Commonly known as: COREG    nitrofurantoin (macrocrystal-monohydrate) 100 MG capsule Commonly known as: MACROBID       TAKE these medications    acetaminophen  325 MG tablet Commonly known as: TYLENOL  Take 2 tablets (650 mg total) by mouth every 6 (six) hours as needed for mild pain (pain score 1-3) (or Fever >/= 101).   albuterol  108 (90 Base) MCG/ACT inhaler Commonly known as: VENTOLIN  HFA Inhale 2 puffs into the lungs every 4 (four) hours as needed for wheezing or shortness of breath.   aspirin  EC 81 MG tablet Take 81 mg by mouth daily. Swallow whole.   atorvastatin  40 MG tablet Commonly known as: LIPITOR Take 1 tablet (40 mg total) by mouth daily.   baclofen  10 MG tablet Commonly known as: LIORESAL  Take 10 mg by mouth at bedtime.   clopidogrel  75 MG tablet Commonly known as: PLAVIX  Take 1 tablet (75 mg total) by mouth daily. take Aspirin  81 mg daily along with Plavix  75 mg daily for 21 days then after that STOP the Aspirin   and continue ONLY Plavix  75 mg daily indefinitely--for secondary stroke Prevention (Per The multicenter SAMMPRIS trial)   cycloSPORINE  0.05 % ophthalmic emulsion Commonly known as: RESTASIS  Place 1 drop into both eyes 2 (two) times daily.   dextromethorphan  30 MG/5ML liquid Commonly known as: Delsym  Take 5 mLs (30 mg total) by mouth 2  (two) times daily as needed for cough.   fluticasone  furoate-vilanterol 200-25 MCG/ACT Aepb Commonly known as: Breo Ellipta  Inhale 1 puff into the lungs daily.   furosemide  20 MG tablet Commonly known as: Lasix  Take 1 tablet (20 mg total) by mouth daily. Start taking on: May 03, 2024   Gemtesa 75 MG Tabs Generic drug: Vibegron Take 75 mg by mouth at bedtime.   guaiFENesin  600 MG 12 hr tablet Commonly known as: Mucinex  Take 1 tablet (600 mg total) by mouth 2 (two) times daily for 3 days.   ipratropium-albuterol  0.5-2.5 (3) MG/3ML Soln Commonly known as: DUONEB Take 3 mLs by nebulization in the morning and at bedtime.   ipratropium-albuterol  0.5-2.5 (3) MG/3ML Soln Commonly known as: DUONEB Take 3 mLs by nebulization every 4 (four) hours as needed (wheezing, shortness of breath).   levothyroxine  50 MCG tablet Commonly known as: SYNTHROID  Take 50 mcg by mouth daily.   metFORMIN  500 MG tablet Commonly known as: GLUCOPHAGE  Take 500 mg by mouth 2 (two) times daily.   multivitamins ther. w/minerals Tabs tablet Take 1 tablet by mouth every morning.   omeprazole 20 MG capsule Commonly known as: PRILOSEC Take 20 mg by mouth every morning.   polyethylene glycol 17 g packet Commonly known as: MIRALAX  / GLYCOLAX  Take 17 g by mouth daily as needed for mild constipation.   potassium chloride  10 MEQ tablet Commonly known as: KLOR-CON  M Take 1 tablet (10 mEq total) by  mouth daily. Start taking on: May 03, 2024   predniSONE 20 MG tablet Commonly known as: DELTASONE Take 2 tablets (40 mg total) by mouth daily with breakfast for 5 days. Start taking on: May 02, 2024   pregabalin  150 MG capsule Commonly known as: LYRICA  Take 1 capsule (150 mg total) by mouth 2 (two) times daily.   rizatriptan  10 MG tablet Commonly known as: MAXALT  Take 10 mg by mouth every 2 (two) hours as needed for migraine. No more than 2 doses in 24 hours   senna-docusate 8.6-50 MG tablet Commonly known  as: Senokot-S Take 2 tablets by mouth at bedtime.   traZODone  50 MG tablet Commonly known as: DESYREL  Take 1 tablet (50 mg total) by mouth at bedtime as needed for sleep.   Trintellix  10 MG Tabs tablet Generic drug: vortioxetine  HBr Take 10 mg by mouth daily.   venlafaxine  XR 150 MG 24 hr capsule Commonly known as: EFFEXOR -XR Take 150 mg by mouth every morning.        Follow-up Information     Omie Bickers, MD Follow up in 1 week(s).   Specialty: Internal Medicine Why: Hospital Follow Up Contact information: [address] [place] [facility] [address] [phone]                Allergies  Allergen Reactions   Betadine [Povidone Iodine] Hives   Penicillins Rash   Allergies as of 05/01/2024       Reactions   Betadine [povidone Iodine] Hives   Penicillins Rash        Medication List     STOP taking these medications    carvedilol  3.125 MG tablet Commonly known as: COREG    nitrofurantoin (macrocrystal-monohydrate) 100 MG capsule Commonly known as: MACROBID       TAKE these medications    acetaminophen  325 MG tablet Commonly known as: TYLENOL  Take 2 tablets (650 mg total) by mouth every 6 (six) hours as needed for mild pain (pain score 1-3) (or Fever >/= 101).   albuterol  108 (90 Base) MCG/ACT inhaler Commonly known as: VENTOLIN  HFA Inhale 2 puffs into the lungs every 4 (four) hours as needed for wheezing or shortness of breath.   aspirin  EC 81 MG tablet Take 81 mg by mouth daily. Swallow whole.   atorvastatin  40 MG tablet Commonly known as: LIPITOR Take 1 tablet (40 mg total) by mouth daily.   baclofen  10 MG tablet Commonly known as: LIORESAL  Take 10 mg by mouth at bedtime.   clopidogrel  75 MG tablet Commonly known as: PLAVIX  Take 1 tablet (75 mg total) by mouth daily. take Aspirin  81 mg daily along with Plavix  75 mg daily for 21 days then after that STOP the Aspirin   and continue ONLY Plavix  75 mg daily indefinitely--for secondary  stroke Prevention (Per The multicenter SAMMPRIS trial)   cycloSPORINE  0.05 % ophthalmic emulsion Commonly known as: RESTASIS  Place 1 drop into both eyes 2 (two) times daily.   dextromethorphan  30 MG/5ML liquid Commonly known as: Delsym  Take 5 mLs (30 mg total) by mouth 2 (two) times daily as needed for cough.   fluticasone  furoate-vilanterol 200-25 MCG/ACT Aepb Commonly known as: Breo Ellipta  Inhale 1 puff into the lungs daily.   furosemide  20 MG tablet Commonly known as: Lasix  Take 1 tablet (20 mg total) by mouth daily. Start taking on: May 03, 2024   Gemtesa 75 MG Tabs Generic drug: Vibegron Take 75 mg by mouth at bedtime.   guaiFENesin  600 MG 12 hr  tablet Commonly known as: Mucinex  Take 1 tablet (600 mg total) by mouth 2 (two) times daily for 3 days.   ipratropium-albuterol  0.5-2.5 (3) MG/3ML Soln Commonly known as: DUONEB Take 3 mLs by nebulization in the morning and at bedtime.   ipratropium-albuterol  0.5-2.5 (3) MG/3ML Soln Commonly known as: DUONEB Take 3 mLs by nebulization every 4 (four) hours as needed (wheezing, shortness of breath).   levothyroxine  50 MCG tablet Commonly known as: SYNTHROID  Take 50 mcg by mouth daily.   metFORMIN  500 MG tablet Commonly known as: GLUCOPHAGE  Take 500 mg by mouth 2 (two) times daily.   multivitamins ther. w/minerals Tabs tablet Take 1 tablet by mouth every morning.   omeprazole 20 MG capsule Commonly known as: PRILOSEC Take 20 mg by mouth every morning.   polyethylene glycol 17 g packet Commonly known as: MIRALAX  / GLYCOLAX  Take 17 g by mouth daily as needed for mild constipation.   potassium chloride  10 MEQ tablet Commonly known as: KLOR-CON  M Take 1 tablet (10 mEq total) by mouth daily. Start taking on: May 03, 2024   predniSONE 20 MG tablet Commonly known as: DELTASONE Take 2 tablets (40 mg total) by mouth daily with breakfast for 5 days. Start taking on: May 02, 2024   pregabalin  150 MG capsule Commonly known  as: LYRICA  Take 1 capsule (150 mg total) by mouth 2 (two) times daily.   rizatriptan  10 MG tablet Commonly known as: MAXALT  Take 10 mg by mouth every 2 (two) hours as needed for migraine. No more than 2 doses in 24 hours   senna-docusate 8.6-50 MG tablet Commonly known as: Senokot-S Take 2 tablets by mouth at bedtime.   traZODone  50 MG tablet Commonly known as: DESYREL  Take 1 tablet (50 mg total) by mouth at bedtime as needed for sleep.   Trintellix  10 MG Tabs tablet Generic drug: vortioxetine  HBr Take 10 mg by mouth daily.   venlafaxine  XR 150 MG 24 hr capsule Commonly known as: EFFEXOR -XR Take 150 mg by mouth every morning.        Procedures/Studies: DG Swallowing Func-Speech Pathology Result Date: 04/29/2024 Table formatting from the original result was not included. Modified Barium Swallow Study Patient Details Name: JNIYA KILGO MRN: [id number] Date of Birth: Jul 30, 1941 Today's Date: 04/29/2024 HPI/PMH: HPI: LOUREN SYLVIA is a 83 y.o. female with medical history significant for diabetes, hypertension, stroke, pituitary adenoma, PSVT, anxiety and depression.  Patient was brought to the ED from her group facility with reports of confusion that started today, also with cough, fever.   At the time of my evaluation, patient is lethargic, responds to direction but unable to answer questions or give history. Recent hospitalization 3/17 to 3/25 for acute/early subacute stroke with concomitant acute metabolic encephalopathy.  Was discharged to SNF.     Patient has been admitted with septic shock secondary to multifocal pneumonia along with associated acute hypoxemic respiratory failure and acute metabolic encephalopathy. BSE requested. Clinical Impression: Patient presents with oropharyngeal swallowing to be essentially [facility]. Note one episode of flash frank penetration of thin liquids that was cleared during the swallow and occasional premature spillage of thin liquids penetrated before the  swallow but was cleared during the swallow every time. Swallow is often triggered at the posterior angle of the ramus but sometimes is delayed until bolus fills the pyriform sinuses. Pharyngeal stripping wave is slightly diminished resulting in occasional trace pharyngeal residue. Note suspected small Zenker's Diverticulum (ZD) - no radiologist present to confirm. Residue in ZD  with retrograde movement back up to the pyriforms. Repeat dry swallow clears retrograde residue from the pyriforms, however note it again collects in the ZD. No retrograde residue was penetrated or aspirated. Esophageal sweep was unremarkable. Recommend upgrade Pt's diet to D3/mech soft and continue with thin liquids. Recommend Pt utilize repeat swallow with every sip/bite to ensure trace/min retrograde residue is cleared from pharynx. Reviewed findings with patient and RN. There are no further ST needs noted at this time, our service will sign off. Factors that may increase risk of adverse event in presence of aspiration Roderick Civatte & Jessy Morocco 2021): No data recorded Recommendations/Plan: Swallowing Evaluation Recommendations Swallowing Evaluation Recommendations Recommendations: PO diet PO Diet Recommendation: Dysphagia 3 (Mechanical soft); Thin liquids (Level 0) Liquid Administration via: Cup; Straw Medication Administration: Whole meds with liquid Supervision: Patient able to self-feed Swallowing strategies  : Slow rate; Small bites/sips; Multiple dry swallows after each bite/sip Postural changes: Position pt fully upright for meals Oral care recommendations: Oral care BID (2x/day) Treatment Plan Treatment Plan Treatment recommendations: Therapy as outlined in treatment plan below Follow-up recommendations: No SLP follow up Interventions: Aspiration precaution training Recommendations Recommendations for follow up therapy are one component of a multi-disciplinary discharge planning process, led by the attending physician.  Recommendations may  be updated based on patient status, additional functional criteria and insurance authorization. Assessment: Orofacial Exam: Orofacial Exam Oral Cavity: Oral Hygiene: WFL Oral Cavity - Dentition: Adequate natural dentition; Missing dentition Orofacial Anatomy: WFL Oral Motor/Sensory Function: WFL Anatomy: Anatomy: WFL Boluses Administered: Boluses Administered Boluses Administered: Thin liquids (Level 0); Mildly thick liquids (Level 2, nectar thick); Moderately thick liquids (Level 3, honey thick); Puree; Solid  Oral Impairment Domain: Oral Impairment Domain Lip Closure: No labial escape Tongue control during bolus hold: Not tested Bolus preparation/mastication: Timely and efficient chewing and mashing Bolus transport/lingual motion: Brisk tongue motion Oral residue: Complete oral clearance Initiation of pharyngeal swallow : Posterior angle of the ramus; Pyriform sinuses  Pharyngeal Impairment Domain: Pharyngeal Impairment Domain Soft palate elevation: No bolus between soft palate (SP)/pharyngeal wall (PW) Laryngeal elevation: Complete superior movement of thyroid  cartilage with complete approximation of arytenoids to epiglottic petiole Anterior hyoid excursion: Complete anterior movement Epiglottic movement: Complete inversion Laryngeal vestibule closure: Complete, no air/contrast in laryngeal vestibule; Incomplete, narrow column air/contrast in laryngeal vestibule Pharyngeal stripping wave : Present - complete Pharyngeal contraction (A/P view only): N/A Pharyngoesophageal segment opening: Complete distension and complete duration, no obstruction of flow Tongue base retraction: No contrast between tongue base and posterior pharyngeal wall (PPW) Pharyngeal residue: Trace residue within or on pharyngeal structures Location of pharyngeal residue: Tongue base; Valleculae  Esophageal Impairment Domain: Esophageal Impairment Domain Esophageal clearance upright position: Esophageal retention with retrograde flow below  pharyngoesophageal segment (PES) (small Zenker's Diverticulum) Pill: Pill Consistency administered: Thin liquids (Level 0) Thin liquids (Level 0): [facility] Penetration/Aspiration Scale Score: Penetration/Aspiration Scale Score 1.  Material does not enter airway: Thin liquids (Level 0); Mildly thick liquids (Level 2, nectar thick); Moderately thick liquids (Level 3, honey thick); Solid; Puree; Pill 2.  Material enters airway, remains ABOVE vocal cords then ejected out: Thin liquids (Level 0) Compensatory Strategies: Compensatory Strategies Compensatory strategies: Yes Multiple swallows: Effective   General Information: Caregiver present: No  Diet Prior to this Study: Dysphagia 2 (finely chopped); Thin liquids (Level 0)   Temperature : Normal   Respiratory Status: WFL   Supplemental O2: Nasal cannula   History of Recent Intubation: No  Behavior/Cognition: Alert; Cooperative; Pleasant mood; Confused Self-Feeding Abilities: Able  to self-feed Baseline vocal quality/speech: Normal Volitional Cough: Able to elicit Volitional Swallow: Able to elicit No data recorded Goal Planning: No data recorded No data recorded No data recorded No data recorded No data recorded Pain: Pain Assessment Pain Assessment: No/denies pain End of Session: Start Time:SLP Start Time (ACUTE ONLY): 1121 Stop Time: SLP Stop Time (ACUTE ONLY): 1154 Time Calculation:SLP Time Calculation (min) (ACUTE ONLY): 33 min Charges: SLP Evaluations $ SLP Speech Visit: 1 Visit SLP Evaluations $MBS Swallow: 1 Procedure SLP visit diagnosis: SLP Visit Diagnosis: Dysphagia, unspecified (R13.10) Past Medical History: Past Medical History: Diagnosis Date  Anxiety   Asthma   ASYMPTOMATIC POSTMENOPAUSAL STATUS 02/09/2009  Cataract   Constipation 03/19/2013  Depression   DIABETES MELLITUS, TYPE II 09/14/2007  HYPERCHOLESTEROLEMIA 02/09/2009  HYPERTENSION 02/09/2009  HYPOTHYROIDISM 09/14/2007  MIGRAINE HEADACHE 09/14/2007  Neuropathy   OSTEOPOROSIS 02/09/2009  PANCREATITIS 09/14/2007   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  PONV (postoperative nausea and vomiting)   Rectocele 03/19/2013  Shortness of breath   SUPERFICIAL PHLEBITIS 09/14/2007  Varicose veins  Past Surgical History: Past Surgical History: Procedure Laterality Date  ABDOMINAL HYSTERECTOMY    ANTERIOR AND POSTERIOR REPAIR N/A 04/02/2013  Procedure: ANTERIOR (CYSTOCELE) AND POSTERIOR REPAIR (RECTOCELE);  Surgeon: Albino Hum, MD;  Location: [facility];  Service: Gynecology;  Laterality: N/A;  APPENDECTOMY    BRAIN SURGERY    CATARACT EXTRACTION W/PHACO  12/05/2011  Procedure: CATARACT EXTRACTION PHACO AND INTRAOCULAR LENS PLACEMENT (IOC);  Surgeon: Anner Kill;  Location: [facility];  Service: Ophthalmology;  Laterality: Left;  CDE:9.65  CHOLECYSTECTOMY    CRANIOTOMY N/A 06/04/2020  Procedure: Endoscopic Transphenoidal Resection of Recurrent PituitaryTumor;  Surgeon: Elna Haggis, MD;  Location: [facility] OR;  Service: Neurosurgery;  Laterality: N/A;  CYSTOSCOPY    ENDOVENOUS ABLATION SAPHENOUS VEIN W/ LASER  11-03-2011   right greater saphenous vein  left leg done 10-2011  EYE SURGERY  98  right cataract extraction 98  LAPAROSCOPIC NISSEN FUNDOPLICATION    NM ESOPHAGEAL REFLUX  08-11-11  PITUITARY EXCISION  10/1997  POSTERIOR REPAIR    TRANSNASAL APPROACH N/A 06/04/2020  Procedure: TRANSNASAL APPROACH;  Surgeon: Ammon Bales, MD;  Location: [facility] OR;  Service: ENT;  Laterality: N/A;  TRANSPHENOIDAL / TRANSNASAL HYPOPHYSECTOMY / RESECTION PITUITARY TUMOR  08-11-11 Florina Husbands 04/29/2024, 12:42 PM  ECHOCARDIOGRAM COMPLETE Result Date: 04/27/2024    ECHOCARDIOGRAM REPORT   Patient Name:   TYANNAH TAKACS Date of Exam: 04/27/2024 Medical Rec #:  [id number]      Height:       65.0 in Accession #:    [id number]     Weight:       125.4 lb Date of Birth:  08/07/1941       BSA:          1.622 m Patient Age:    82 years       BP:           91/54 mmHg Patient Gender: F              HR:           99 bpm. Exam Location:  [facility] Procedure: 2D Echo, Color Doppler and  Cardiac Doppler (Both Spectral and Color            Flow Doppler were utilized during procedure). Indications:    I50.31 Acute diastolic (congestive) heart failure  History:        Patient has prior history of Echocardiogram examinations, most  recent 03/13/2024. Risk Factors:Hypertension, Diabetes and                 Dyslipidemia.  Sonographer:    Sherline Distel Senior RDCS Referring Phys: [id number] [facility] IMPRESSIONS  1. Left ventricular ejection fraction, by estimation, is 60 to 65%. The left ventricle has normal function. The left ventricle has no regional wall motion abnormalities. Left ventricular diastolic parameters are indeterminate.  2. Right ventricular systolic function is normal. The right ventricular size is normal.  3. Left atrial size was mildly dilated.  4. MVA by PT1/2 1.23 cm2 with men gradient 12 peak 19 mmHg at HR of 101 bpm. The mitral valve is abnormal. Mild mitral valve regurgitation. Moderate mitral stenosis. Moderate mitral annular calcification.  5. The aortic valve is tricuspid. There is mild calcification of the aortic valve. There is mild thickening of the aortic valve. Aortic valve regurgitation is trivial. Aortic valve sclerosis is present, with no evidence of aortic valve stenosis.  6. The inferior vena cava is normal in size with greater than 50% respiratory variability, suggesting right atrial pressure of 3 mmHg. FINDINGS  Left Ventricle: Left ventricular ejection fraction, by estimation, is 60 to 65%. The left ventricle has normal function. The left ventricle has no regional wall motion abnormalities. Strain was performed and the global longitudinal strain is indeterminate. The left ventricular internal cavity size was normal in size. There is no left ventricular hypertrophy. Left ventricular diastolic parameters are indeterminate. Right Ventricle: The right ventricular size is normal. No increase in right ventricular wall thickness. Right ventricular systolic function  is normal. Left Atrium: Left atrial size was mildly dilated. Right Atrium: Right atrial size was normal in size. Pericardium: There is no evidence of pericardial effusion. Mitral Valve: MVA by PT1/2 1.23 cm2 with men gradient 12 peak 19 mmHg at HR of 101 bpm. The mitral valve is abnormal. There is moderate thickening of the mitral valve leaflet(s). There is moderate calcification of the mitral valve leaflet(s). Moderate mitral annular calcification. Mild mitral valve regurgitation. Moderate mitral valve stenosis. MV peak gradient, 19.1 mmHg. The mean mitral valve gradient is 12.7 mmHg. Tricuspid Valve: The tricuspid valve is normal in structure. Tricuspid valve regurgitation is trivial. No evidence of tricuspid stenosis. Aortic Valve: The aortic valve is tricuspid. There is mild calcification of the aortic valve. There is mild thickening of the aortic valve. Aortic valve regurgitation is trivial. Aortic valve sclerosis is present, with no evidence of aortic valve stenosis. Pulmonic Valve: The pulmonic valve was normal in structure. Pulmonic valve regurgitation is not visualized. No evidence of pulmonic stenosis. Aorta: The aortic root is normal in size and structure. Venous: The inferior vena cava is normal in size with greater than 50% respiratory variability, suggesting right atrial pressure of 3 mmHg. IAS/Shunts: No atrial level shunt detected by color flow Doppler. Additional Comments: 3D was performed not requiring image post processing on an independent workstation and was indeterminate.  LEFT VENTRICLE PLAX 2D LVIDd:         3.70 cm LVIDs:         2.90 cm LV PW:         0.70 cm LV IVS:        0.80 cm LVOT diam:     1.80 cm LV SV:         55 LV SV Index:   34 LVOT Area:     2.54 cm  RIGHT VENTRICLE RV S prime:     13.80 cm/s TAPSE (  M-mode): 1.8 cm LEFT ATRIUM             Index        RIGHT ATRIUM           Index LA diam:        3.80 cm 2.34 cm/m   RA Area:     13.30 cm LA Vol (A2C):   60.3 ml 37.17 ml/m   RA Volume:   28.90 ml  17.81 ml/m LA Vol (A4C):   45.0 ml 27.74 ml/m LA Biplane Vol: 55.6 ml 34.27 ml/m  AORTIC VALVE LVOT Vmax:   115.00 cm/s LVOT Vmean:  76.200 cm/s LVOT VTI:    0.216 m  AORTA Ao Root diam: 3.00 cm Ao Asc diam:  3.20 cm MITRAL VALVE MV Area VTI:  1.15 cm    SHUNTS MV Peak grad: 19.1 mmHg   Systemic VTI:  0.22 m MV Mean grad: 12.7 mmHg   Systemic Diam: 1.80 cm MV Vmax:      2.18 m/s MV Vmean:     172.3 cm/s Janelle Mediate MD Electronically signed by Janelle Mediate MD Signature Date/Time: 04/27/2024/2:17:05 PM    Final    DG CHEST PORT 1 VIEW Result Date: 04/26/2024 CLINICAL DATA:  Hypoxemia. EXAM: PORTABLE CHEST 1 VIEW COMPARISON:  Chest radiograph dated 04/24/2024. FINDINGS: Diffuse interstitial and interlobular septal prominence and edema. Small bilateral pleural effusions. No pneumothorax. The cardiac silhouette is within limits. Atherosclerotic calcification of the aorta. No acute osseous pathology. IMPRESSION: Pulmonary edema with small bilateral pleural effusions. Electronically Signed   By: Angus Bark M.D.   On: 04/26/2024 13:25   CT Head Wo Contrast Result Date: 04/24/2024 CLINICAL DATA:  ams EXAM: CT HEAD WITHOUT CONTRAST TECHNIQUE: Contiguous axial images were obtained from the base of the skull through the vertex without intravenous contrast. RADIATION DOSE REDUCTION: This exam was performed according to the departmental dose-optimization program which includes automated exposure control, adjustment of the mA and/or kV according to patient size and/or use of iterative reconstruction technique. COMPARISON:  CT scan head from 03/14/2024. FINDINGS: Brain: No evidence of acute infarction, hemorrhage, hydrocephalus, extra-axial collection or mass lesion/mass effect. There is bilateral periventricular hypodensity, which is non-specific but most likely seen in the settings of microvascular ischemic changes. Moderate in extent. 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  Otherwise normal appearance of brain parenchyma. Redemonstration of patient's known sellar/suprasellar mass. No significant interval change. Ventricles are normal. Cerebral volume is age appropriate. Vascular: No hyperdense vessel or unexpected calcification. Intracranial arteriosclerosis. Skull: Normal. Negative for fracture or focal lesion. Sinuses/Orbits: No acute finding. Other: Visualized mastoid air cells are unremarkable. No mastoid effusion. IMPRESSION: *No acute intracranial abnormality. No significant interval change. Electronically Signed   By: Beula Brunswick M.D.   On: 04/24/2024 14:00   CT Chest Wo Contrast Result Date: 04/24/2024 CLINICAL DATA:  cf pna.  Increased altered mental status. EXAM: CT CHEST WITHOUT CONTRAST TECHNIQUE: Multidetector CT imaging of the chest was performed following the standard protocol without IV contrast. RADIATION DOSE REDUCTION: This exam was performed according to the departmental dose-optimization program which includes automated exposure control, adjustment of the mA and/or kV according to patient size and/or use of iterative reconstruction technique. COMPARISON:  CT scan chest from 12/25/2023. FINDINGS: Cardiovascular: Normal cardiac size. No pericardial effusion. No aortic aneurysm. There are coronary artery calcifications, in keeping with coronary artery disease. There are also mild peripheral atherosclerotic vascular calcifications of thoracic aorta and its major branches. Mediastinum/Nodes: Visualized thyroid  gland appears  grossly unremarkable. No solid / cystic mediastinal masses. The esophagus is nondistended precluding optimal assessment. There are few enlarged mediastinal lymph nodes, which appear grossly similar to the prior study and favored benign/reactive in the given clinical setting. No axillary lymphadenopathy by size criteria. Evaluation of bilateral hila is limited due to lack on intravenous contrast: however, no large hilar lymphadenopathy  identified. Lungs/Pleura: The central tracheo-bronchial tree is patent. There are heterogeneous solid and ground-glass opacities throughout left lung and patchy opacities in the right lung lower lobe, compatible with multilobar pneumonia. Follow-up to clearing is recommended. There is no associated lung mass, pleural effusion or pneumothorax. No suspicious lung nodule. Upper Abdomen: Surgically absent gallbladder. Stable left adrenal adenomas. Remaining visualized upper abdominal viscera within normal limits. Musculoskeletal: The visualized soft tissues of the chest wall are grossly unremarkable. No suspicious osseous lesions. There are mild to moderate multilevel degenerative changes in the visualized spine. IMPRESSION: 1. There are heterogeneous, solid and ground-glass opacities throughout left lung and patchy opacities in the right lung lower lobe, compatible with multilobar pneumonia. Follow-up to clearing is recommended. 2. Multiple other nonacute observations, as described above. Aortic Atherosclerosis (ICD10-I70.0). Electronically Signed   By: Beula Brunswick M.D.   On: 04/24/2024 13:56   DG Chest Portable 1 View Result Date: 04/24/2024 CLINICAL DATA:  Fever and cough. EXAM: PORTABLE CHEST 1 VIEW COMPARISON:  Chest radiograph dated 03/11/2024. FINDINGS: Diffuse interstitial nodular density throughout the left lung concerning for pneumonia, possibly atypical in etiology. Aspiration is not excluded. No consolidative changes. There is no pleural effusion pneumothorax. The cardiac silhouette is within limits. Atherosclerotic calcification of the aorta. No acute osseous pathology. IMPRESSION: Diffuse interstitial nodular density throughout the left lung concerning for pneumonia. Electronically Signed   By: Angus Bark M.D.   On: 04/24/2024 11:40     Subjective: Pt says that she is breathing much better today.  She is feeling well and eager to discharge back to [facility].     Discharge  Exam: Vitals:   05/01/24 0354 05/01/24 0718  BP: 99/67   Pulse:    Resp:    Temp: 98.2 F (36.8 C)   SpO2:  94%   Vitals:   04/30/24 1922 04/30/24 1954 05/01/24 0354 05/01/24 0718  BP: 108/68  99/67   Pulse:      Resp:      Temp: 98 F (36.7 C)  98.2 F (36.8 C)   TempSrc: Axillary  Axillary   SpO2:  94%  94%  Weight:      Height:       General: Pt is alert, awake, not in acute distress Cardiovascular: normal S1/S2 +, no rubs, no gallops Respiratory: CTA bilaterally, rare wheezing, no rhonchi Abdominal: Soft, NT, ND, bowel sounds + Extremities: 1+ BLE edema, no cyanosis   The results of significant diagnostics from this hospitalization (including imaging, microbiology, ancillary and laboratory) are listed below for reference.     Microbiology: Recent Results (from the past 240 hours)  Culture, blood (Routine x 2)     Status: None   Collection Time: 04/24/24 10:53 AM   Specimen: BLOOD  Result Value Ref Range Status   Specimen Description BLOOD LEFT ASSIST CONTROL  Final   Special Requests   Final    BOTTLES DRAWN AEROBIC AND ANAEROBIC Blood Culture adequate volume   Culture   Final    NO GROWTH 5 DAYS Performed at [facility], [address], [place], [place] [address]    Report Status  04/29/2024 FINAL  Final  Resp panel by RT-PCR (RSV, Flu A&B, Covid) Anterior Nasal Swab     Status: None   Collection Time: 04/24/24 11:19 AM   Specimen: Anterior Nasal Swab  Result Value Ref Range Status   SARS Coronavirus 2 by RT PCR NEGATIVE NEGATIVE Final    Comment: (NOTE) SARS-CoV-2 target nucleic acids are NOT DETECTED.  The SARS-CoV-2 RNA is generally detectable in upper respiratory specimens during the acute phase of infection. The lowest concentration of SARS-CoV-2 viral copies this assay can detect is 138 copies/mL. A negative result does not preclude SARS-Cov-2 infection and should not be used as the sole basis for treatment or other patient management decisions. A  negative result may occur with  improper specimen collection/handling, submission of specimen other than nasopharyngeal swab, presence of viral mutation(s) within the areas targeted by this assay, and inadequate number of viral copies(<138 copies/mL). A negative result must be combined with clinical observations, patient history, and epidemiological information. The expected result is Negative.  Fact Sheet for Patients:  BloggerCourse.com  Fact Sheet for Healthcare Providers:  SeriousBroker.it  This test is no t yet approved or cleared by the [place]  FDA and  has been authorized for detection and/or diagnosis of SARS-CoV-2 by FDA under an Emergency Use Authorization (EUA). This EUA will remain  in effect (meaning this test can be used) for the duration of the COVID-19 declaration under Section 564(b)(1) of the Act, 21 U.S.C.section 360bbb-3(b)(1), unless the authorization is terminated  or revoked sooner.       Influenza A by PCR NEGATIVE NEGATIVE Final   Influenza B by PCR NEGATIVE NEGATIVE Final    Comment: (NOTE) The Xpert Xpress SARS-CoV-2/FLU/RSV plus assay is intended as an aid in the diagnosis of influenza from Nasopharyngeal swab specimens and should not be used as a sole basis for treatment. Nasal washings and aspirates are unacceptable for Xpert Xpress SARS-CoV-2/FLU/RSV testing.  Fact Sheet for Patients: BloggerCourse.com  Fact Sheet for Healthcare Providers: SeriousBroker.it  This test is not yet approved or cleared by the [place]  FDA and has been authorized for detection and/or diagnosis of SARS-CoV-2 by FDA under an Emergency Use Authorization (EUA). This EUA will remain in effect (meaning this test can be used) for the duration of the COVID-19 declaration under Section 564(b)(1) of the Act, 21 U.S.C. section 360bbb-3(b)(1), unless the authorization  is terminated or revoked.     Resp Syncytial Virus by PCR NEGATIVE NEGATIVE Final    Comment: (NOTE) Fact Sheet for Patients: BloggerCourse.com  Fact Sheet for Healthcare Providers: SeriousBroker.it  This test is not yet approved or cleared by the [place]  FDA and has been authorized for detection and/or diagnosis of SARS-CoV-2 by FDA under an Emergency Use Authorization (EUA). This EUA will remain in effect (meaning this test can be used) for the duration of the COVID-19 declaration under Section 564(b)(1) of the Act, 21 U.S.C. section 360bbb-3(b)(1), unless the authorization is terminated or revoked.  Performed at [facility], [address], [place], [place] [address]   Culture, blood (Routine X 2) w Reflex to ID Panel     Status: None   Collection Time: 04/24/24 11:40 AM   Specimen: BLOOD  Result Value Ref Range Status   Specimen Description BLOOD BLOOD LEFT WRIST  Final   Special Requests   Final    BOTTLES DRAWN AEROBIC AND ANAEROBIC Blood Culture results may not be optimal due to an inadequate volume of blood received in culture bottles  Culture   Final    NO GROWTH 5 DAYS Performed at [facility], [address], [place], [place] [address]    Report Status 04/29/2024 FINAL  Final  MRSA Next Gen by PCR, Nasal     Status: None   Collection Time: 04/24/24  5:20 PM   Specimen: Nasal Mucosa; Nasal Swab  Result Value Ref Range Status   MRSA by PCR Next Gen NOT DETECTED NOT DETECTED Final    Comment: (NOTE) The GeneXpert MRSA Assay (FDA approved for NASAL specimens only), is one component of a comprehensive MRSA colonization surveillance program. It is not intended to diagnose MRSA infection nor to guide or monitor treatment for MRSA infections. Test performance is not FDA approved in patients less than 46 years old. Performed at [facility], [address], [place], [place] [address]      Labs: BNP (last 3  results) Recent Labs    12/25/23 2330 04/26/24 0425  BNP 475.0* 438.0*   Basic Metabolic Panel: Recent Labs  Lab 04/27/24 0532 04/28/24 0425 04/29/24 0348 04/30/24 0420 05/01/24 0327  NA 139 138 135 134* 132*  K 3.4* 4.2 4.2 4.0 4.2  CL 104 101 98 94* 90*  CO2 27 29 29 30 31   GLUCOSE 202* 186* 239* 280* 328*  BUN 7* 10 15 19 21   CREATININE 0.37* 0.52 0.49 0.55 0.77  CALCIUM  7.9* 8.4* 8.4* 8.7* 8.7*  MG 1.7 1.7 1.7 1.7 1.6*   Liver Function Tests: Recent Labs  Lab 04/24/24 1113  AST 26  ALT 20  ALKPHOS 57  BILITOT 0.9  PROT 7.1  ALBUMIN 3.4*   No results for input(s): "LIPASE", "AMYLASE" in the last 168 hours. No results for input(s): "AMMONIA" in the last 168 hours. CBC: Recent Labs  Lab 04/24/24 1113 04/25/24 0429 04/26/24 0425 04/27/24 0532 04/28/24 0425 04/30/24 0420 05/01/24 0327  WBC 8.1   < > 11.0* 7.7 12.5* 12.7* 15.3*  NEUTROABS 6.1  --   --   --   --   --   --   HGB 13.7   < > 10.6* 11.3* 11.7* 12.4 13.4  HCT 41.2   < > 33.8* 34.5* 36.0 38.3 39.7  MCV 87.8   < > 90.6 87.6 88.0 87.6 87.3  PLT 260   < > 220 223 270 319 356   < > = values in this interval not displayed.   Cardiac Enzymes: No results for input(s): "CKTOTAL", "CKMB", "CKMBINDEX", "TROPONINI" in the last 168 hours. BNP: Invalid input(s): "POCBNP" CBG: Recent Labs  Lab 04/30/24 0732 04/30/24 1135 04/30/24 1608 04/30/24 2107 05/01/24 0723  GLUCAP 265* 367* 289* 216* 264*   D-Dimer No results for input(s): "DDIMER" in the last 72 hours. Hgb A1c No results for input(s): "HGBA1C" in the last 72 hours. Lipid Profile No results for input(s): "CHOL", "HDL", "LDLCALC", "TRIG", "CHOLHDL", "LDLDIRECT" in the last 72 hours. Thyroid  function studies No results for input(s): "TSH", "T4TOTAL", "T3FREE", "THYROIDAB" in the last 72 hours.  Invalid input(s): "FREET3" Anemia work up No results for input(s): "VITAMINB12", "FOLATE", "FERRITIN", "TIBC", "IRON", "RETICCTPCT" in the last 72  hours. Urinalysis    Component Value Date/Time   COLORURINE YELLOW 04/24/2024 1123   APPEARANCEUR CLEAR 04/24/2024 1123   LABSPEC 1.014 04/24/2024 1123   PHURINE 5.0 04/24/2024 1123   GLUCOSEU NEGATIVE 04/24/2024 1123   GLUCOSEU >=1000 (A) 06/25/2020 1441   HGBUR NEGATIVE 04/24/2024 1123   BILIRUBINUR NEGATIVE 04/24/2024 1123   BILIRUBINUR neg 03/19/2013 1512  KETONESUR 5 (A) 04/24/2024 1123   PROTEINUR NEGATIVE 04/24/2024 1123   UROBILINOGEN 0.2 06/25/2020 1441   NITRITE NEGATIVE 04/24/2024 1123   LEUKOCYTESUR NEGATIVE 04/24/2024 1123   Sepsis Labs Recent Labs  Lab 04/27/24 0532 04/28/24 0425 04/30/24 0420 05/01/24 0327  WBC 7.7 12.5* 12.7* 15.3*   Microbiology Recent Results (from the past 240 hours)  Culture, blood (Routine x 2)     Status: None   Collection Time: 04/24/24 10:53 AM   Specimen: BLOOD  Result Value Ref Range Status   Specimen Description BLOOD LEFT ASSIST CONTROL  Final   Special Requests   Final    BOTTLES DRAWN AEROBIC AND ANAEROBIC Blood Culture adequate volume   Culture   Final    NO GROWTH 5 DAYS Performed at [facility], [address] [place], [place] [address]    Report Status 04/29/2024 FINAL  Final  Resp panel by RT-PCR (RSV, Flu A&B, Covid) Anterior Nasal Swab     Status: None   Collection Time: 04/24/24 11:19 AM   Specimen: Anterior Nasal Swab  Result Value Ref Range Status   SARS Coronavirus 2 by RT PCR NEGATIVE NEGATIVE Final    Comment: (NOTE) SARS-CoV-2 target nucleic acids are NOT DETECTED.  The SARS-CoV-2 RNA is generally detectable in upper respiratory specimens during the acute phase of infection. The lowest concentration of SARS-CoV-2 viral copies this assay can detect is 138 copies/mL. A negative result does not preclude SARS-Cov-2 infection and should not be used as the sole basis for treatment or other patient management decisions. A negative result may occur with  improper specimen collection/handling, submission of  specimen other than nasopharyngeal swab, presence of viral mutation(s) within the areas targeted by this assay, and inadequate number of viral copies(<138 copies/mL). A negative result must be combined with clinical observations, patient history, and epidemiological information. The expected result is Negative.  Fact Sheet for Patients:  BloggerCourse.com  Fact Sheet for Healthcare Providers:  SeriousBroker.it  This test is no t yet approved or cleared by the [place]  FDA and  has been authorized for detection and/or diagnosis of SARS-CoV-2 by FDA under an Emergency Use Authorization (EUA). This EUA will remain  in effect (meaning this test can be used) for the duration of the COVID-19 declaration under Section 564(b)(1) of the Act, 21 U.S.C.section 360bbb-3(b)(1), unless the authorization is terminated  or revoked sooner.       Influenza A by PCR NEGATIVE NEGATIVE Final   Influenza B by PCR NEGATIVE NEGATIVE Final    Comment: (NOTE) The Xpert Xpress SARS-CoV-2/FLU/RSV plus assay is intended as an aid in the diagnosis of influenza from Nasopharyngeal swab specimens and should not be used as a sole basis for treatment. Nasal washings and aspirates are unacceptable for Xpert Xpress SARS-CoV-2/FLU/RSV testing.  Fact Sheet for Patients: BloggerCourse.com  Fact Sheet for Healthcare Providers: SeriousBroker.it  This test is not yet approved or cleared by the [place]  FDA and has been authorized for detection and/or diagnosis of SARS-CoV-2 by FDA under an Emergency Use Authorization (EUA). This EUA will remain in effect (meaning this test can be used) for the duration of the COVID-19 declaration under Section 564(b)(1) of the Act, 21 U.S.C. section 360bbb-3(b)(1), unless the authorization is terminated or revoked.     Resp Syncytial Virus by PCR NEGATIVE NEGATIVE Final     Comment: (NOTE) Fact Sheet for Patients: BloggerCourse.com  Fact Sheet for Healthcare Providers: SeriousBroker.it  This test is not yet approved or cleared by the  [place]  FDA and has been authorized for detection and/or diagnosis of SARS-CoV-2 by FDA under an Emergency Use Authorization (EUA). This EUA will remain in effect (meaning this test can be used) for the duration of the COVID-19 declaration under Section 564(b)(1) of the Act, 21 U.S.C. section 360bbb-3(b)(1), unless the authorization is terminated or revoked.  Performed at [facility], [address] [place], [place] [address]   Culture, blood (Routine X 2) w Reflex to ID Panel     Status: None   Collection Time: 04/24/24 11:40 AM   Specimen: BLOOD  Result Value Ref Range Status   Specimen Description BLOOD BLOOD LEFT WRIST  Final   Special Requests   Final    BOTTLES DRAWN AEROBIC AND ANAEROBIC Blood Culture results may not be optimal due to an inadequate volume of blood received in culture bottles   Culture   Final    NO GROWTH 5 DAYS Performed at [facility], [address] [place], [place] [address]    Report Status 04/29/2024 FINAL  Final  MRSA Next Gen by PCR, Nasal     Status: None   Collection Time: 04/24/24  5:20 PM   Specimen: Nasal Mucosa; Nasal Swab  Result Value Ref Range Status   MRSA by PCR Next Gen NOT DETECTED NOT DETECTED Final    Comment: (NOTE) The GeneXpert MRSA Assay (FDA approved for NASAL specimens only), is one component of a comprehensive MRSA colonization surveillance program. It is not intended to diagnose MRSA infection nor to guide or monitor treatment for MRSA infections. Test performance is not FDA approved in patients less than 20 years old. Performed at [facility], [address] [place], [place] [address]     Time coordinating discharge:  43 mins   SIGNED:  Faustino Hook, MD  [facility] 05/01/2024,  10:08 AM How to contact the [facility] Attending or Consulting provider 7A - 7P or covering provider during after hours 7P -7A, for this patient?  Check the care team in [facility] and look for a) attending/consulting [facility] provider listed and b) the [facility] team listed Log into [email or web address] and use [facility]'s universal password to access. If you do not have the password, please contact the hospital operator. Locate the [facility] provider you are looking for under [facility] and page to a number that you can be directly reached. If you still have difficulty reaching the provider, please page the [facility] (Director on Call) for the Hospitalists listed on amion for assistance.

## 2024-05-02 DIAGNOSIS — G9349 Other encephalopathy: Secondary | ICD-10-CM | POA: Diagnosis not present

## 2024-05-02 DIAGNOSIS — I69398 Other sequelae of cerebral infarction: Secondary | ICD-10-CM | POA: Diagnosis not present

## 2024-05-02 DIAGNOSIS — L89152 Pressure ulcer of sacral region, stage 2: Secondary | ICD-10-CM | POA: Diagnosis not present

## 2024-05-02 DIAGNOSIS — J45909 Unspecified asthma, uncomplicated: Secondary | ICD-10-CM | POA: Diagnosis not present

## 2024-05-02 DIAGNOSIS — E1142 Type 2 diabetes mellitus with diabetic polyneuropathy: Secondary | ICD-10-CM | POA: Diagnosis not present

## 2024-05-06 DIAGNOSIS — J45909 Unspecified asthma, uncomplicated: Secondary | ICD-10-CM | POA: Diagnosis not present

## 2024-05-06 DIAGNOSIS — L89152 Pressure ulcer of sacral region, stage 2: Secondary | ICD-10-CM | POA: Diagnosis not present

## 2024-05-06 DIAGNOSIS — I69398 Other sequelae of cerebral infarction: Secondary | ICD-10-CM | POA: Diagnosis not present

## 2024-05-06 DIAGNOSIS — G9349 Other encephalopathy: Secondary | ICD-10-CM | POA: Diagnosis not present

## 2024-05-06 DIAGNOSIS — E1142 Type 2 diabetes mellitus with diabetic polyneuropathy: Secondary | ICD-10-CM | POA: Diagnosis not present

## 2024-05-09 DIAGNOSIS — E1142 Type 2 diabetes mellitus with diabetic polyneuropathy: Secondary | ICD-10-CM | POA: Diagnosis not present

## 2024-05-09 DIAGNOSIS — J45909 Unspecified asthma, uncomplicated: Secondary | ICD-10-CM | POA: Diagnosis not present

## 2024-05-09 DIAGNOSIS — I69398 Other sequelae of cerebral infarction: Secondary | ICD-10-CM | POA: Diagnosis not present

## 2024-05-09 DIAGNOSIS — L89152 Pressure ulcer of sacral region, stage 2: Secondary | ICD-10-CM | POA: Diagnosis not present

## 2024-05-09 DIAGNOSIS — G9349 Other encephalopathy: Secondary | ICD-10-CM | POA: Diagnosis not present

## 2024-05-10 ENCOUNTER — Emergency Department (HOSPITAL_COMMUNITY)

## 2024-05-10 ENCOUNTER — Ambulatory Visit
Admission: EM | Admit: 2024-05-10 | Discharge: 2024-05-10 | Disposition: A | Discharge status: Home / Self Care | Attending: Family Medicine | Admitting: Family Medicine

## 2024-05-10 ENCOUNTER — Encounter: Payer: Self-pay | Admitting: Emergency Medicine

## 2024-05-10 ENCOUNTER — Encounter (HOSPITAL_COMMUNITY): Payer: Self-pay | Admitting: Emergency Medicine

## 2024-05-10 ENCOUNTER — Other Ambulatory Visit: Payer: Self-pay

## 2024-05-10 ENCOUNTER — Inpatient Hospital Stay (HOSPITAL_COMMUNITY)
Admission: EM | Admit: 2024-05-10 | Discharge: 2024-05-15 | DRG: 871 | Disposition: A | Discharge status: Skilled Nursing Facility | Source: Ambulatory Visit | Attending: Family Medicine | Admitting: Family Medicine

## 2024-05-10 DIAGNOSIS — Z8701 Personal history of pneumonia (recurrent): Secondary | ICD-10-CM

## 2024-05-10 DIAGNOSIS — Z91041 Radiographic dye allergy status: Secondary | ICD-10-CM

## 2024-05-10 DIAGNOSIS — J9621 Acute and chronic respiratory failure with hypoxia: Secondary | ICD-10-CM | POA: Diagnosis not present

## 2024-05-10 DIAGNOSIS — Z1152 Encounter for screening for COVID-19: Secondary | ICD-10-CM | POA: Diagnosis not present

## 2024-05-10 DIAGNOSIS — E872 Acidosis, unspecified: Secondary | ICD-10-CM | POA: Diagnosis not present

## 2024-05-10 DIAGNOSIS — Z90710 Acquired absence of both cervix and uterus: Secondary | ICD-10-CM

## 2024-05-10 DIAGNOSIS — Z7902 Long term (current) use of antithrombotics/antiplatelets: Secondary | ICD-10-CM

## 2024-05-10 DIAGNOSIS — I639 Cerebral infarction, unspecified: Secondary | ICD-10-CM | POA: Diagnosis present

## 2024-05-10 DIAGNOSIS — J45909 Unspecified asthma, uncomplicated: Secondary | ICD-10-CM | POA: Diagnosis not present

## 2024-05-10 DIAGNOSIS — R062 Wheezing: Secondary | ICD-10-CM | POA: Diagnosis not present

## 2024-05-10 DIAGNOSIS — F419 Anxiety disorder, unspecified: Secondary | ICD-10-CM | POA: Diagnosis present

## 2024-05-10 DIAGNOSIS — I69398 Other sequelae of cerebral infarction: Secondary | ICD-10-CM | POA: Diagnosis not present

## 2024-05-10 DIAGNOSIS — R531 Weakness: Secondary | ICD-10-CM | POA: Diagnosis not present

## 2024-05-10 DIAGNOSIS — A419 Sepsis, unspecified organism: Secondary | ICD-10-CM | POA: Diagnosis not present

## 2024-05-10 DIAGNOSIS — J9601 Acute respiratory failure with hypoxia: Secondary | ICD-10-CM

## 2024-05-10 DIAGNOSIS — Z8249 Family history of ischemic heart disease and other diseases of the circulatory system: Secondary | ICD-10-CM

## 2024-05-10 DIAGNOSIS — E039 Hypothyroidism, unspecified: Secondary | ICD-10-CM | POA: Diagnosis present

## 2024-05-10 DIAGNOSIS — R6520 Severe sepsis without septic shock: Secondary | ICD-10-CM | POA: Diagnosis not present

## 2024-05-10 DIAGNOSIS — E1142 Type 2 diabetes mellitus with diabetic polyneuropathy: Secondary | ICD-10-CM

## 2024-05-10 DIAGNOSIS — G9349 Other encephalopathy: Secondary | ICD-10-CM | POA: Diagnosis not present

## 2024-05-10 DIAGNOSIS — E7800 Pure hypercholesterolemia, unspecified: Secondary | ICD-10-CM | POA: Diagnosis not present

## 2024-05-10 DIAGNOSIS — R0902 Hypoxemia: Secondary | ICD-10-CM | POA: Diagnosis not present

## 2024-05-10 DIAGNOSIS — R0602 Shortness of breath: Secondary | ICD-10-CM

## 2024-05-10 DIAGNOSIS — R000 Tachycardia, unspecified: Secondary | ICD-10-CM

## 2024-05-10 DIAGNOSIS — Z825 Family history of asthma and other chronic lower respiratory diseases: Secondary | ICD-10-CM | POA: Diagnosis not present

## 2024-05-10 DIAGNOSIS — Z8673 Personal history of transient ischemic attack (TIA), and cerebral infarction without residual deficits: Secondary | ICD-10-CM

## 2024-05-10 DIAGNOSIS — I10 Essential (primary) hypertension: Secondary | ICD-10-CM | POA: Diagnosis not present

## 2024-05-10 DIAGNOSIS — L89152 Pressure ulcer of sacral region, stage 2: Secondary | ICD-10-CM | POA: Diagnosis not present

## 2024-05-10 DIAGNOSIS — Z7951 Long term (current) use of inhaled steroids: Secondary | ICD-10-CM | POA: Diagnosis not present

## 2024-05-10 DIAGNOSIS — J440 Chronic obstructive pulmonary disease with acute lower respiratory infection: Secondary | ICD-10-CM | POA: Diagnosis not present

## 2024-05-10 DIAGNOSIS — T380X5A Adverse effect of glucocorticoids and synthetic analogues, initial encounter: Secondary | ICD-10-CM | POA: Diagnosis present

## 2024-05-10 DIAGNOSIS — R0682 Tachypnea, not elsewhere classified: Secondary | ICD-10-CM | POA: Diagnosis not present

## 2024-05-10 DIAGNOSIS — I499 Cardiac arrhythmia, unspecified: Secondary | ICD-10-CM | POA: Diagnosis not present

## 2024-05-10 DIAGNOSIS — J189 Pneumonia, unspecified organism: Secondary | ICD-10-CM | POA: Diagnosis present

## 2024-05-10 DIAGNOSIS — Z79890 Hormone replacement therapy (postmenopausal): Secondary | ICD-10-CM

## 2024-05-10 DIAGNOSIS — Z7984 Long term (current) use of oral hypoglycemic drugs: Secondary | ICD-10-CM

## 2024-05-10 DIAGNOSIS — Z79899 Other long term (current) drug therapy: Secondary | ICD-10-CM

## 2024-05-10 DIAGNOSIS — Z66 Do not resuscitate: Secondary | ICD-10-CM | POA: Diagnosis not present

## 2024-05-10 DIAGNOSIS — Z880 Allergy status to penicillin: Secondary | ICD-10-CM

## 2024-05-10 DIAGNOSIS — J441 Chronic obstructive pulmonary disease with (acute) exacerbation: Secondary | ICD-10-CM | POA: Diagnosis not present

## 2024-05-10 DIAGNOSIS — Z0389 Encounter for observation for other suspected diseases and conditions ruled out: Secondary | ICD-10-CM | POA: Diagnosis not present

## 2024-05-10 DIAGNOSIS — Z823 Family history of stroke: Secondary | ICD-10-CM

## 2024-05-10 DIAGNOSIS — R051 Acute cough: Secondary | ICD-10-CM

## 2024-05-10 DIAGNOSIS — E119 Type 2 diabetes mellitus without complications: Secondary | ICD-10-CM | POA: Diagnosis present

## 2024-05-10 DIAGNOSIS — Z7982 Long term (current) use of aspirin: Secondary | ICD-10-CM | POA: Diagnosis not present

## 2024-05-10 LAB — GLUCOSE, CAPILLARY
Glucose-Capillary: 404 mg/dL — ABNORMAL HIGH (ref 70–99)
Glucose-Capillary: 426 mg/dL — ABNORMAL HIGH (ref 70–99)
Glucose-Capillary: 503 mg/dL (ref 70–99)

## 2024-05-10 LAB — URINALYSIS, W/ REFLEX TO CULTURE (INFECTION SUSPECTED)
Bacteria, UA: NONE SEEN
Bilirubin Urine: NEGATIVE
Glucose, UA: 150 mg/dL — AB
Hgb urine dipstick: NEGATIVE
Ketones, ur: NEGATIVE mg/dL
Nitrite: NEGATIVE
Protein, ur: NEGATIVE mg/dL
Specific Gravity, Urine: 1.011 (ref 1.005–1.030)
WBC, UA: >50 WBC/hpf (ref 0–5)
pH: 7 (ref 5.0–8.0)

## 2024-05-10 LAB — CBC WITH DIFFERENTIAL/PLATELET
Abs Immature Granulocytes: 0.24 10*3/uL — ABNORMAL HIGH (ref 0.00–0.07)
Basophils Absolute: 0.1 10*3/uL (ref 0.0–0.1)
Basophils Relative: 0 %
Eosinophils Absolute: 0.6 10*3/uL — ABNORMAL HIGH (ref 0.0–0.5)
Eosinophils Relative: 3 %
HCT: 37.4 % (ref 36.0–46.0)
Hemoglobin: 12.8 g/dL (ref 12.0–15.0)
Immature Granulocytes: 1 %
Lymphocytes Relative: 16 %
Lymphs Abs: 2.8 10*3/uL (ref 0.7–4.0)
MCH: 30.3 pg (ref 26.0–34.0)
MCHC: 34.2 g/dL (ref 30.0–36.0)
MCV: 88.4 fL (ref 80.0–100.0)
Monocytes Absolute: 1.1 10*3/uL — ABNORMAL HIGH (ref 0.1–1.0)
Monocytes Relative: 6 %
Neutro Abs: 12.5 10*3/uL — ABNORMAL HIGH (ref 1.7–7.7)
Neutrophils Relative %: 74 %
Platelets: 263 10*3/uL (ref 150–400)
RBC: 4.23 MIL/uL (ref 3.87–5.11)
RDW: 16.2 % — ABNORMAL HIGH (ref 11.5–15.5)
WBC: 17.2 10*3/uL — ABNORMAL HIGH (ref 4.0–10.5)
nRBC: 0 % (ref 0.0–0.2)

## 2024-05-10 LAB — COMPREHENSIVE METABOLIC PANEL WITH GFR
ALT: 32 U/L (ref 0–44)
AST: 20 U/L (ref 15–41)
Albumin: 3 g/dL — ABNORMAL LOW (ref 3.5–5.0)
Alkaline Phosphatase: 47 U/L (ref 38–126)
Anion gap: 14 (ref 5–15)
BUN: 15 mg/dL (ref 8–23)
CO2: 23 mmol/L (ref 22–32)
Calcium: 8.4 mg/dL — ABNORMAL LOW (ref 8.9–10.3)
Chloride: 94 mmol/L — ABNORMAL LOW (ref 98–111)
Creatinine, Ser: 0.74 mg/dL (ref 0.44–1.00)
GFR, Estimated: >60 mL/min (ref >60)
Glucose, Bld: 222 mg/dL — ABNORMAL HIGH (ref 70–99)
Potassium: 3.7 mmol/L (ref 3.5–5.1)
Sodium: 131 mmol/L — ABNORMAL LOW (ref 135–145)
Total Bilirubin: 0.9 mg/dL (ref 0.0–1.2)
Total Protein: 6.2 g/dL — ABNORMAL LOW (ref 6.5–8.1)

## 2024-05-10 LAB — PROTIME-INR
INR: 1.1 (ref 0.8–1.2)
Prothrombin Time: 13.9 s (ref 11.4–15.2)

## 2024-05-10 LAB — RESP PANEL BY RT-PCR (RSV, FLU A&B, COVID)  RVPGX2
Influenza A by PCR: NEGATIVE
Influenza B by PCR: NEGATIVE
Resp Syncytial Virus by PCR: NEGATIVE
SARS Coronavirus 2 by RT PCR: NEGATIVE

## 2024-05-10 LAB — LACTIC ACID, PLASMA
Lactic Acid, Venous: 3.4 mmol/L (ref 0.5–1.9)
Lactic Acid, Venous: 2.5 mmol/L (ref 0.5–1.9)

## 2024-05-10 LAB — PROCALCITONIN: Procalcitonin: <0.1 ng/mL

## 2024-05-10 MED ORDER — VANCOMYCIN HCL IN DEXTROSE 1-5 GM/200ML-% IV SOLN
1000.0000 mg | Freq: Once | INTRAVENOUS | Status: AC
  Administered 2024-05-10: 1000 mg via INTRAVENOUS
  Filled 2024-05-10: qty 200

## 2024-05-10 MED ORDER — SODIUM CHLORIDE 0.9 % IV SOLN
2.0000 g | Freq: Once | INTRAVENOUS | Status: AC
  Administered 2024-05-10: 2 g via INTRAVENOUS
  Filled 2024-05-10: qty 12.5

## 2024-05-10 MED ORDER — ACETAMINOPHEN 325 MG PO TABS
650.0000 mg | ORAL_TABLET | Freq: Four times a day (QID) | ORAL | Status: DC | PRN
  Administered 2024-05-11: 650 mg via ORAL
  Filled 2024-05-10: qty 2

## 2024-05-10 MED ORDER — LEVALBUTEROL HCL 0.63 MG/3ML IN NEBU
0.6300 mg | INHALATION_SOLUTION | Freq: Four times a day (QID) | RESPIRATORY_TRACT | Status: DC | PRN
  Administered 2024-05-12: 0.63 mg via RESPIRATORY_TRACT
  Filled 2024-05-10: qty 3

## 2024-05-10 MED ORDER — SODIUM CHLORIDE 0.9 % IV SOLN
2.0000 g | Freq: Two times a day (BID) | INTRAVENOUS | Status: DC
  Administered 2024-05-11: 2 g via INTRAVENOUS
  Filled 2024-05-10: qty 12.5

## 2024-05-10 MED ORDER — INSULIN ASPART 100 UNIT/ML IJ SOLN
20.0000 [IU] | Freq: Once | INTRAMUSCULAR | Status: AC
  Administered 2024-05-10: 20 [IU] via SUBCUTANEOUS

## 2024-05-10 MED ORDER — VORTIOXETINE HBR 10 MG PO TABS
10.0000 mg | ORAL_TABLET | Freq: Every day | ORAL | Status: DC
  Administered 2024-05-11 – 2024-05-15 (×5): 10 mg via ORAL
  Filled 2024-05-10 (×6): qty 1

## 2024-05-10 MED ORDER — INSULIN ASPART 100 UNIT/ML IJ SOLN
0.0000 [IU] | Freq: Every day | INTRAMUSCULAR | Status: DC
  Administered 2024-05-10: 5 [IU] via SUBCUTANEOUS
  Administered 2024-05-11 – 2024-05-12 (×2): 2 [IU] via SUBCUTANEOUS
  Administered 2024-05-13: 5 [IU] via SUBCUTANEOUS
  Administered 2024-05-14: 3 [IU] via SUBCUTANEOUS

## 2024-05-10 MED ORDER — SODIUM CHLORIDE 0.9 % IV SOLN
500.0000 mg | INTRAVENOUS | Status: AC
  Administered 2024-05-10 – 2024-05-14 (×5): 500 mg via INTRAVENOUS
  Filled 2024-05-10 (×5): qty 5

## 2024-05-10 MED ORDER — IPRATROPIUM-ALBUTEROL 0.5-2.5 (3) MG/3ML IN SOLN: 3.0000 mL | RESPIRATORY_TRACT | Status: DC | PRN

## 2024-05-10 MED ORDER — ATORVASTATIN CALCIUM 40 MG PO TABS
40.0000 mg | ORAL_TABLET | Freq: Every day | ORAL | Status: DC
  Administered 2024-05-11 – 2024-05-15 (×5): 40 mg via ORAL
  Filled 2024-05-10 (×5): qty 1

## 2024-05-10 MED ORDER — LACTATED RINGERS IV SOLN
INTRAVENOUS | Status: DC
Start: 2024-05-10 — End: 2024-05-11

## 2024-05-10 MED ORDER — METHYLPREDNISOLONE SODIUM SUCC 125 MG IJ SOLR
125.0000 mg | Freq: Once | INTRAMUSCULAR | Status: AC
  Administered 2024-05-10: 125 mg via INTRAVENOUS
  Filled 2024-05-10: qty 2

## 2024-05-10 MED ORDER — IPRATROPIUM-ALBUTEROL 0.5-2.5 (3) MG/3ML IN SOLN
3.0000 mL | Freq: Once | RESPIRATORY_TRACT | Status: AC
  Administered 2024-05-10: 3 mL via RESPIRATORY_TRACT

## 2024-05-10 MED ORDER — ASPIRIN 81 MG PO TBEC
81.0000 mg | DELAYED_RELEASE_TABLET | Freq: Every day | ORAL | Status: DC
Start: 2024-05-11 — End: 2024-05-15
  Administered 2024-05-11 – 2024-05-15 (×5): 81 mg via ORAL
  Filled 2024-05-10 (×5): qty 1

## 2024-05-10 MED ORDER — IPRATROPIUM-ALBUTEROL 0.5-2.5 (3) MG/3ML IN SOLN
3.0000 mL | Freq: Once | RESPIRATORY_TRACT | Status: AC
  Administered 2024-05-10: 3 mL via RESPIRATORY_TRACT
  Filled 2024-05-10: qty 3

## 2024-05-10 MED ORDER — ENOXAPARIN SODIUM 40 MG/0.4ML IJ SOSY
40.0000 mg | PREFILLED_SYRINGE | INTRAMUSCULAR | Status: DC
  Administered 2024-05-10 – 2024-05-14 (×5): 40 mg via SUBCUTANEOUS
  Filled 2024-05-10 (×5): qty 0.4

## 2024-05-10 MED ORDER — BACLOFEN 10 MG PO TABS
10.0000 mg | ORAL_TABLET | Freq: Every day | ORAL | Status: DC
  Administered 2024-05-10 – 2024-05-14 (×5): 10 mg via ORAL
  Filled 2024-05-10 (×5): qty 1

## 2024-05-10 MED ORDER — METHYLPREDNISOLONE SODIUM SUCC 125 MG IJ SOLR
60.0000 mg | Freq: Two times a day (BID) | INTRAMUSCULAR | Status: DC
  Administered 2024-05-11 – 2024-05-13 (×5): 60 mg via INTRAVENOUS
  Filled 2024-05-10 (×5): qty 2

## 2024-05-10 MED ORDER — ONDANSETRON HCL 4 MG PO TABS: 4.0000 mg | ORAL_TABLET | Freq: Four times a day (QID) | ORAL | Status: DC | PRN

## 2024-05-10 MED ORDER — MIRABEGRON ER 25 MG PO TB24
25.0000 mg | ORAL_TABLET | Freq: Every day | ORAL | Status: DC
  Administered 2024-05-10 – 2024-05-15 (×6): 25 mg via ORAL
  Filled 2024-05-10 (×6): qty 1

## 2024-05-10 MED ORDER — LEVOTHYROXINE SODIUM 50 MCG PO TABS
50.0000 ug | ORAL_TABLET | Freq: Every day | ORAL | Status: DC
Start: 2024-05-11 — End: 2024-05-15
  Administered 2024-05-11 – 2024-05-15 (×5): 50 ug via ORAL
  Filled 2024-05-10 (×5): qty 1

## 2024-05-10 MED ORDER — SENNA 8.6 MG PO TABS
2.0000 | ORAL_TABLET | Freq: Every day | ORAL | Status: DC
  Administered 2024-05-10 – 2024-05-14 (×5): 17.2 mg via ORAL
  Filled 2024-05-10 (×5): qty 2

## 2024-05-10 MED ORDER — LACTATED RINGERS IV BOLUS
1000.0000 mL | Freq: Once | INTRAVENOUS | Status: AC
  Administered 2024-05-10: 1000 mL via INTRAVENOUS

## 2024-05-10 MED ORDER — VENLAFAXINE HCL ER 75 MG PO CP24
150.0000 mg | ORAL_CAPSULE | ORAL | Status: DC
  Administered 2024-05-11 – 2024-05-15 (×5): 150 mg via ORAL
  Filled 2024-05-10 (×6): qty 2

## 2024-05-10 MED ORDER — PREGABALIN 75 MG PO CAPS
150.0000 mg | ORAL_CAPSULE | Freq: Two times a day (BID) | ORAL | Status: DC
  Administered 2024-05-10 – 2024-05-15 (×10): 150 mg via ORAL
  Filled 2024-05-10 (×11): qty 2

## 2024-05-10 MED ORDER — ONDANSETRON HCL 4 MG/2ML IJ SOLN: 4.0000 mg | Freq: Four times a day (QID) | INTRAMUSCULAR | Status: DC | PRN

## 2024-05-10 MED ORDER — SODIUM CHLORIDE 0.9 % IV BOLUS
500.0000 mL | Freq: Once | INTRAVENOUS | Status: AC
  Administered 2024-05-10: 500 mL via INTRAVENOUS

## 2024-05-10 MED ORDER — PANTOPRAZOLE SODIUM 40 MG PO TBEC
40.0000 mg | DELAYED_RELEASE_TABLET | Freq: Every day | ORAL | Status: DC
  Administered 2024-05-11 – 2024-05-15 (×5): 40 mg via ORAL
  Filled 2024-05-10 (×5): qty 1

## 2024-05-10 MED ORDER — FLUTICASONE FUROATE-VILANTEROL 200-25 MCG/ACT IN AEPB
1.0000 | INHALATION_SPRAY | Freq: Every day | RESPIRATORY_TRACT | Status: DC
  Administered 2024-05-11 – 2024-05-15 (×5): 1 via RESPIRATORY_TRACT
  Filled 2024-05-10: qty 28

## 2024-05-10 MED ORDER — CYCLOSPORINE 0.05 % OP EMUL
1.0000 [drp] | Freq: Two times a day (BID) | OPHTHALMIC | Status: DC
  Administered 2024-05-10 – 2024-05-15 (×10): 1 [drp] via OPHTHALMIC
  Filled 2024-05-10 (×9): qty 30

## 2024-05-10 MED ORDER — INSULIN ASPART 100 UNIT/ML IJ SOLN
0.0000 [IU] | Freq: Three times a day (TID) | INTRAMUSCULAR | Status: DC
  Administered 2024-05-11 (×2): 7 [IU] via SUBCUTANEOUS
  Administered 2024-05-12: 15 [IU] via SUBCUTANEOUS
  Administered 2024-05-12: 7 [IU] via SUBCUTANEOUS
  Administered 2024-05-12: 4 [IU] via SUBCUTANEOUS
  Administered 2024-05-13 (×2): 7 [IU] via SUBCUTANEOUS
  Administered 2024-05-14: 4 [IU] via SUBCUTANEOUS
  Administered 2024-05-14: 11 [IU] via SUBCUTANEOUS
  Administered 2024-05-14: 7 [IU] via SUBCUTANEOUS
  Administered 2024-05-15: 4 [IU] via SUBCUTANEOUS

## 2024-05-10 MED ORDER — CLOPIDOGREL BISULFATE 75 MG PO TABS
75.0000 mg | ORAL_TABLET | Freq: Every day | ORAL | Status: DC
  Administered 2024-05-11 – 2024-05-15 (×5): 75 mg via ORAL
  Filled 2024-05-10 (×5): qty 1

## 2024-05-10 MED ORDER — LACTATED RINGERS IV SOLN: INTRAVENOUS | Status: DC

## 2024-05-10 MED ORDER — ACETAMINOPHEN 650 MG RE SUPP: 650.0000 mg | Freq: Four times a day (QID) | RECTAL | Status: DC | PRN

## 2024-05-10 MED ORDER — TRAZODONE HCL 50 MG PO TABS
50.0000 mg | ORAL_TABLET | Freq: Every evening | ORAL | Status: DC | PRN
Start: 2024-05-10 — End: 2024-05-15

## 2024-05-10 MED ORDER — INSULIN GLARGINE-YFGN 100 UNIT/ML ~~LOC~~ SOLN
20.0000 [IU] | Freq: Every day | SUBCUTANEOUS | Status: DC
  Administered 2024-05-10 – 2024-05-12 (×3): 20 [IU] via SUBCUTANEOUS
  Filled 2024-05-10 (×4): qty 0.2

## 2024-05-10 NOTE — ED Notes (Signed)
Pt placed on 2L per MD request.

## 2024-05-10 NOTE — ED Notes (Signed)
 Pt unable to give urine sample. Stated, "I peed before you came in." Periwick was placed on pt to obtain urine sample.

## 2024-05-10 NOTE — Progress Notes (Signed)
 Breo inhaler placed in room

## 2024-05-10 NOTE — Progress Notes (Signed)
 Elink is following code sepsis.

## 2024-05-10 NOTE — ED Notes (Signed)
[facility] called for transport 

## 2024-05-10 NOTE — H&P (Signed)
 History and Physical    Patient: [name] White ZOX:[id number] DOB: 09-28-1941 DOA: 05/10/2024 DOS: the patient was seen and examined on 05/10/2024 PCP: Omie Bickers, MD  Patient coming from: SNF  Chief Complaint:  Chief Complaint  Patient presents with   Cough   Shortness of Breath   Weakness   HPI: [name] White is a 83 y.o. female with medical history significant of diabetes, hypertension, hypothyroidism, chronic pain.  Patient presents from skilled nursing facility due to increasing shortness of breath that started this morning.  She was admitted here before at the beginning of the month for sepsis from pneumonia that resulted in ICU stay and needing to be on pressors.  She was discharged on antibiotics and steroids for an additional 5 days.  She had been improving fairly well but now is becoming extremely short of breath with exertion.  Review of Systems: As mentioned in the history of present illness. All other systems reviewed and are negative. Past Medical History:  Diagnosis Date   Anxiety    Asthma    ASYMPTOMATIC POSTMENOPAUSAL STATUS 02/09/2009   Cataract    Constipation 03/19/2013   Depression    DIABETES MELLITUS, TYPE II 09/14/2007   HYPERCHOLESTEROLEMIA 02/09/2009   HYPERTENSION 02/09/2009   HYPOTHYROIDISM 09/14/2007   MIGRAINE HEADACHE 09/14/2007   Neuropathy    OSTEOPOROSIS 02/09/2009   PANCREATITIS 09/14/2007   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   PONV (postoperative nausea and vomiting)    Rectocele 03/19/2013   Shortness of breath    SUPERFICIAL PHLEBITIS 09/14/2007   Varicose veins    Past Surgical History:  Procedure Laterality Date   ABDOMINAL HYSTERECTOMY     ANTERIOR AND POSTERIOR REPAIR N/A 04/02/2013   Procedure: ANTERIOR (CYSTOCELE) AND POSTERIOR REPAIR (RECTOCELE);  Surgeon: Albino Hum, MD;  Location: [facility];  Service: Gynecology;  Laterality: N/A;   APPENDECTOMY     BRAIN SURGERY     CATARACT EXTRACTION W/PHACO  12/05/2011   Procedure: CATARACT  EXTRACTION PHACO AND INTRAOCULAR LENS PLACEMENT (IOC);  Surgeon: Anner Kill;  Location: [facility];  Service: Ophthalmology;  Laterality: Left;  CDE:9.65   CHOLECYSTECTOMY     CRANIOTOMY N/A 06/04/2020   Procedure: Endoscopic Transphenoidal Resection of Recurrent PituitaryTumor;  Surgeon: Elna Haggis, MD;  Location: [facility];  Service: Neurosurgery;  Laterality: N/A;   CYSTOSCOPY     ENDOVENOUS ABLATION SAPHENOUS VEIN W/ LASER  11-03-2011   right greater saphenous vein   left leg done 10-2011   EYE SURGERY  98   right cataract extraction 98   LAPAROSCOPIC NISSEN FUNDOPLICATION     NM ESOPHAGEAL REFLUX  08-11-11   PITUITARY EXCISION  10/1997   POSTERIOR REPAIR     TRANSNASAL APPROACH N/A 06/04/2020   Procedure: TRANSNASAL APPROACH;  Surgeon: Ammon Bales, MD;  Location: [facility] OR;  Service: ENT;  Laterality: N/A;   TRANSPHENOIDAL / TRANSNASAL HYPOPHYSECTOMY / RESECTION PITUITARY TUMOR  08-11-11   Social History:  reports that she has never smoked. She has never used smokeless tobacco. She reports that she does not drink alcohol and does not use drugs.  Allergies  Allergen Reactions   Betadine [Povidone Iodine] Hives   Penicillins Rash    Family History  Problem Relation Age of Onset   Heart disease Mother    Asthma Mother    COPD Father    Heart disease Father    Stroke Father    Asthma Daughter    Migraines Daughter    Neuropathy Son  Cancer Neg Hx    Anesthesia problems Neg Hx    Hypotension Neg Hx    Malignant hyperthermia Neg Hx    Pseudochol deficiency Neg Hx     Prior to Admission medications   Medication Sig Start Date End Date Taking? Authorizing Provider  acetaminophen  (TYLENOL ) 325 MG tablet Take 2 tablets (650 mg total) by mouth every 6 (six) hours as needed for mild pain (pain score 1-3) (or Fever >/= 101). 01/01/24  Yes Shahmehdi, Constantino Demark, MD  albuterol  (VENTOLIN  HFA) 108 (90 Base) MCG/ACT inhaler Inhale 2 puffs into the lungs every 4 (four) hours as needed for  wheezing or shortness of breath. 05/01/24  Yes Johnson, Clanford L, MD  aspirin  EC 81 MG tablet Take 81 mg by mouth daily. Swallow whole.   Yes [provider]  atorvastatin  (LIPITOR) 40 MG tablet Take 1 tablet (40 mg total) by mouth daily. 03/20/24  Yes Emokpae, Courage, MD  baclofen  (LIORESAL ) 10 MG tablet Take 10 mg by mouth at bedtime. 02/14/24  Yes [provider]  clopidogrel  (PLAVIX ) 75 MG tablet Take 1 tablet (75 mg total) by mouth daily. take Aspirin  81 mg daily along with Plavix  75 mg daily for 21 days then after that STOP the Aspirin   and continue ONLY Plavix  75 mg daily indefinitely--for secondary stroke Prevention (Per The multicenter SAMMPRIS trial) Patient taking differently: Take 75 mg by mouth daily. 03/20/24  Yes Emokpae, Courage, MD  cycloSPORINE  (RESTASIS ) 0.05 % ophthalmic emulsion Place 1 drop into both eyes 2 (two) times daily.   Yes [provider]  dextromethorphan  (DELSYM ) 30 MG/5ML liquid Take 5 mLs (30 mg total) by mouth 2 (two) times daily as needed for cough. 05/01/24  Yes Johnson, Clanford L, MD  fluticasone  furoate-vilanterol (BREO ELLIPTA ) 200-25 MCG/ACT AEPB Inhale 1 puff into the lungs daily. 03/19/24  Yes Emokpae, Courage, MD  furosemide  (LASIX ) 20 MG tablet Take 1 tablet (20 mg total) by mouth daily. 05/03/24 05/03/25 Yes Johnson, Clanford L, MD  ipratropium-albuterol  (DUONEB) 0.5-2.5 (3) MG/3ML SOLN Take 3 mLs by nebulization every 4 (four) hours as needed (wheezing, shortness of breath). 05/01/24  Yes Johnson, Clanford L, MD  levothyroxine  (SYNTHROID ) 50 MCG tablet Take 50 mcg by mouth daily.   Yes [provider]  metFORMIN  (GLUCOPHAGE ) 500 MG tablet Take 500 mg by mouth 2 (two) times daily. 04/16/24  Yes [provider]  Multiple Vitamins-Minerals (MULTIVITAMINS THER. W/MINERALS) TABS Take 1 tablet by mouth every morning.   Yes [provider]  omeprazole (PRILOSEC) 20 MG capsule Take 20 mg by mouth every morning.   Yes  [provider]  polyethylene glycol (MIRALAX  / GLYCOLAX ) 17 g packet Take 17 g by mouth daily as needed for mild constipation. 05/01/24  Yes Johnson, Clanford L, MD  potassium chloride  (KLOR-CON  M) 10 MEQ tablet Take 1 tablet (10 mEq total) by mouth daily. 05/03/24  Yes Johnson, Clanford L, MD  pregabalin  (LYRICA ) 150 MG capsule Take 1 capsule (150 mg total) by mouth 2 (two) times daily. 03/19/24  Yes Emokpae, Courage, MD  rizatriptan  (MAXALT ) 10 MG tablet Take 10 mg by mouth every 2 (two) hours as needed for migraine. No more than 2 doses in 24 hours 04/16/24  Yes [provider]  senna (SENOKOT) 8.6 MG TABS tablet Take 2 tablets by mouth at bedtime.   Yes [provider]  traZODone  (DESYREL ) 50 MG tablet Take 1 tablet (50 mg total) by mouth at bedtime as needed for sleep. 03/19/24  Yes Emokpae, Courage, MD  TRINTELLIX  10 MG TABS tablet Take 10 mg by mouth daily. 03/09/24  Yes [provider]  venlafaxine  (EFFEXOR -XR) 150 MG 24 hr capsule Take 150 mg by mouth every morning.   Yes [provider]  Vibegron (GEMTESA) 75 MG TABS Take 75 mg by mouth at bedtime.   Yes [provider]  guaiFENesin  (MUCINEX ) 600 MG 12 hr tablet Take 600 mg by mouth 2 (two) times daily. X3 days Patient not taking: Reported on 05/10/2024    [provider]  nitrofurantoin, macrocrystal-monohydrate, (MACROBID) 100 MG capsule Take 100 mg by mouth 2 (two) times daily. X7 days Patient not taking: Reported on 05/10/2024    [provider]  predniSONE  (DELTASONE ) 20 MG tablet Take 40 mg by mouth daily with breakfast. X5 days Patient not taking: Reported on 05/10/2024    [provider]    Physical Exam: Vitals:   05/10/24 1648 05/10/24 1658 05/10/24 1700 05/10/24 1715  BP:    107/68  Pulse:  (!) 111 (!) 108 (!) 104  Resp:  17 20 17   Temp:      SpO2: 97% 100% 100% 100%  Weight:      Height:       General: Elderly female. Awake and alert and oriented  x3. No acute cardiopulmonary distress.  HEENT: Normocephalic atraumatic.  Right and left ears normal in appearance.  Pupils equal, round, reactive to light. Extraocular muscles are intact. Sclerae anicteric and noninjected.  Moist mucosal membranes. No mucosal lesions.  Neck: Neck supple without lymphadenopathy. No carotid bruits. No masses palpated.  Cardiovascular: Regular rate with normal S1-S2 sounds. No murmurs, rubs, gallops auscultated. No JVD.  Respiratory: Rales and rhonchi scattered throughout.  Prolonged exhalation.  No accessory muscle use. Abdomen: Soft, nontender, nondistended. Active bowel sounds. No masses or hepatosplenomegaly  Skin: No rashes, lesions, or ulcerations.  Dry, warm to touch. 2+ dorsalis pedis and radial pulses. Musculoskeletal: No calf or leg pain. All major joints not erythematous nontender.  No upper or lower joint deformation.  Good ROM.  No contractures  Psychiatric: Intact judgment and insight. Pleasant and cooperative. Neurologic: No focal neurological deficits. Strength is 5/5 and symmetric in upper and lower extremities.  Cranial nerves II through XII are grossly intact.  Data Reviewed: Results for orders placed or performed during the hospital encounter of 05/10/24 (from the past 24 hours)  Resp panel by RT-PCR (RSV, Flu A&B, Covid) Anterior Nasal Swab     Status: None   Collection Time: 05/10/24  2:53 PM   Specimen: Anterior Nasal Swab  Result Value Ref Range   SARS Coronavirus 2 by RT PCR NEGATIVE NEGATIVE   Influenza A by PCR NEGATIVE NEGATIVE   Influenza B by PCR NEGATIVE NEGATIVE   Resp Syncytial Virus by PCR NEGATIVE NEGATIVE  Lactic acid, plasma     Status: Abnormal   Collection Time: 05/10/24  3:00 PM  Result Value Ref Range   Lactic Acid, Venous 3.4 (HH) 0.5 - 1.9 mmol/L  Comprehensive metabolic panel     Status: Abnormal   Collection Time: 05/10/24  3:00 PM  Result Value Ref Range   Sodium 131 (L) 135 - 145 mmol/L   Potassium 3.7 3.5 -  5.1 mmol/L   Chloride 94 (L) 98 - 111 mmol/L   CO2 23 22 - 32 mmol/L   Glucose, Bld 222 (H) 70 - 99 mg/dL   BUN 15 8 - 23 mg/dL   Creatinine, Ser 1.61 0.44 -  1.00 mg/dL   Calcium  8.4 (L) 8.9 - 10.3 mg/dL   Total Protein 6.2 (L) 6.5 - 8.1 g/dL   Albumin 3.0 (L) 3.5 - 5.0 g/dL   AST 20 15 - 41 U/L   ALT 32 0 - 44 U/L   Alkaline Phosphatase 47 38 - 126 U/L   Total Bilirubin 0.9 0.0 - 1.2 mg/dL   GFR, Estimated >19 >14 mL/min   Anion gap 14 5 - 15  CBC with Differential     Status: Abnormal   Collection Time: 05/10/24  3:00 PM  Result Value Ref Range   WBC 17.2 (H) 4.0 - 10.5 K/uL   RBC 4.23 3.87 - 5.11 MIL/uL   Hemoglobin 12.8 12.0 - 15.0 g/dL   HCT 78.2 95.6 - 21.3 %   MCV 88.4 80.0 - 100.0 fL   MCH 30.3 26.0 - 34.0 pg   MCHC 34.2 30.0 - 36.0 g/dL   RDW 08.6 (H) 57.8 - 46.9 %   Platelets 263 150 - 400 K/uL   nRBC 0.0 0.0 - 0.2 %   Neutrophils Relative % 74 %   Neutro Abs 12.5 (H) 1.7 - 7.7 K/uL   Lymphocytes Relative 16 %   Lymphs Abs 2.8 0.7 - 4.0 K/uL   Monocytes Relative 6 %   Monocytes Absolute 1.1 (H) 0.1 - 1.0 K/uL   Eosinophils Relative 3 %   Eosinophils Absolute 0.6 (H) 0.0 - 0.5 K/uL   Basophils Relative 0 %   Basophils Absolute 0.1 0.0 - 0.1 K/uL   Immature Granulocytes 1 %   Abs Immature Granulocytes 0.24 (H) 0.00 - 0.07 K/uL  Protime-INR     Status: None   Collection Time: 05/10/24  3:00 PM  Result Value Ref Range   Prothrombin Time 13.9 11.4 - 15.2 seconds   INR 1.1 0.8 - 1.2  Blood Culture (routine x 2)     Status: None (Preliminary result)   Collection Time: 05/10/24  3:00 PM   Specimen: BLOOD  Result Value Ref Range   Specimen Description BLOOD LEFT ANTECUBITAL    Special Requests      BOTTLES DRAWN AEROBIC AND ANAEROBIC Blood Culture adequate volume Performed at [facility], [address] [place], [place] [address]    Culture PENDING    Report Status PENDING   Blood Culture (routine x 2)     Status: None (Preliminary result)   Collection Time:  05/10/24  3:00 PM   Specimen: BLOOD  Result Value Ref Range   Specimen Description BLOOD BLOOD LEFT HAND    Special Requests      AEROBIC BOTTLE ONLY Blood Culture results may not be optimal due to an inadequate volume of blood received in culture bottles Performed at [facility], [address] [place], [place] [address]    Culture PENDING    Report Status PENDING   Lactic acid, plasma     Status: Abnormal   Collection Time: 05/10/24  4:49 PM  Result Value Ref Range   Lactic Acid, Venous 2.5 (HH) 0.5 - 1.9 mmol/L    DG Chest Port 1 View Result Date: 05/10/2024 CLINICAL DATA:  Questionable sepsis EXAM: PORTABLE CHEST 1 VIEW COMPARISON:  Chest x-ray 04/26/2024.  Chest CT 04/24/2004. FINDINGS: The heart size and mediastinal contours are within normal limits. Both lungs are clear. The visualized skeletal structures are unremarkable. IMPRESSION: No active disease. Electronically Signed   By: Tyron Gallon M.D.   On: 05/10/2024 15:17     Assessment and  Plan: No notes have been filed under this hospital service. Service: Hospitalist  Principal Problem:   Severe sepsis (HCC) Active Problems:   CAP (community acquired pneumonia)   CVA (cerebral vascular accident) (HCC)   Hypothyroidism   Diabetes (HCC)   Essential hypertension   Acute on chronic respiratory failure with hypoxia (HCC)  Severe sepsis from pneumonia Antibiotics: cefepime , azithro, vanc.  MRSA screen Robitussin Blood cultures drawn in the emergency department Sputum cultures CBC tomorrow Strep and Legionella antigen by urine Acute respiratory failure with hypoxia Normal sats on [place]. History of CVA ASA Plavix  Hypertension Antihypertensives Diabetes Lantus 20 units SSI CBGs Hypothyroidism Cont levothyroxine    Advance Care Planning:   Code Status: Limited: Do not attempt resuscitation (DNR) -DNR-LIMITED -Do Not Intubate/DNI  confirmed by patient  Consults: none  Family Communication: daughter  present  Severity of Illness: The appropriate patient status for this patient is INPATIENT. Inpatient status is judged to be reasonable and necessary in order to provide the required intensity of service to ensure the patient's safety. The patient's presenting symptoms, physical exam findings, and initial radiographic and laboratory data in the context of their chronic comorbidities is felt to place them at high risk for further clinical deterioration. Furthermore, it is not anticipated that the patient will be medically stable for discharge from the hospital within 2 midnights of admission.   * I certify that at the point of admission it is my clinical judgment that the patient will require inpatient hospital care spanning beyond 2 midnights from the point of admission due to high intensity of service, high risk for further deterioration and high frequency of surveillance required.*  Author: Madyn Ivins J Jermani Eberlein, DO 05/10/2024 5:39 PM  For on call review [email or web address].

## 2024-05-10 NOTE — ED Triage Notes (Signed)
 History of pneumonia last week.  Continues to have congestion

## 2024-05-10 NOTE — ED Provider Notes (Signed)
 [facility]    CSN: [id number] Arrival date & time: 05/10/24  1054      History   Chief Complaint No chief complaint on file.   HPI [name] White is a 83 y.o. female.   Patient presenting today for progressively worsening cough, congestion, wheezing, shortness of breath, generalized weakness.  She was admitted to the ICU on 04/24/2024 for multifocal pneumonia, septic shock and mental status change and was given IV antibiotics, discharged on prednisone  which she finished on 05/07/2024 per paperwork.  She states she had started feeling a bit better but is now progressively worsening again over the last few days to a week, she does not member exactly when she started feeling worse.  She states she has been taking breathing treatments every 4 hours around-the-clock which provide very slight short-term relief of symptoms.  She is otherwise not currently taking any medications for her symptoms reportedly.  She does have a history of asthma.    Past Medical History:  Diagnosis Date   Anxiety    Asthma    ASYMPTOMATIC POSTMENOPAUSAL STATUS 02/09/2009   Cataract    Constipation 03/19/2013   Depression    DIABETES MELLITUS, TYPE II 09/14/2007   HYPERCHOLESTEROLEMIA 02/09/2009   HYPERTENSION 02/09/2009   HYPOTHYROIDISM 09/14/2007   MIGRAINE HEADACHE 09/14/2007   Neuropathy    OSTEOPOROSIS 02/09/2009   PANCREATITIS 09/14/2007   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   PONV (postoperative nausea and vomiting)    Rectocele 03/19/2013   Shortness of breath    SUPERFICIAL PHLEBITIS 09/14/2007   Varicose veins     Patient Active Problem List   Diagnosis Date Noted   Hypomagnesemia 05/01/2024   Septic shock (HCC) 04/24/2024   Multifocal pneumonia 04/24/2024   Acute respiratory failure with hypoxia (HCC) 04/24/2024   CVA (cerebral vascular accident) (HCC) 03/12/2024   Acute lower UTI 03/12/2024   Type 2 diabetes mellitus with peripheral neuropathy (HCC) 03/12/2024   Anxiety and depression  03/12/2024   GERD without esophagitis 03/12/2024   Acute encephalopathy 03/11/2024   Rhabdomyolysis 12/27/2023   Pressure injury of skin 12/27/2023   UTI (urinary tract infection) 12/26/2023   Sepsis due to urinary tract infection (HCC) 12/26/2023   Acute metabolic encephalopathy 12/26/2023   Status post transsphenoidal pituitary resection (HCC) 06/04/2020   Pituitary adenoma with extrasellar extension (HCC) 06/04/2020   Pituitary tumor 05/29/2020   Dizzinesses 02/02/2020   Acute pain of right shoulder 02/02/2020   Encounter for screening mammogram for malignant neoplasm of breast 01/28/2020   Vitamin D  deficiency 01/28/2020   Paroxysmal supraventricular tachycardia (HCC) 06/19/2014   Rectocele 03/19/2013   Constipation 03/19/2013   HYPERCHOLESTEROLEMIA 02/09/2009   Essential hypertension 02/09/2009   Osteoporosis 02/09/2009   Post-menopausal 02/09/2009   HEAT INTOLERANCE 02/01/2008   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   Hypothyroidism 09/14/2007   Diabetes (HCC) 09/14/2007   Migraine headache 09/14/2007   SUPERFICIAL PHLEBITIS 09/14/2007   PANCREATITIS 09/14/2007   MENOPAUSAL SYNDROME 09/14/2007   FATIGUE 09/14/2007    Past Surgical History:  Procedure Laterality Date   ABDOMINAL HYSTERECTOMY     ANTERIOR AND POSTERIOR REPAIR N/A 04/02/2013   Procedure: ANTERIOR (CYSTOCELE) AND POSTERIOR REPAIR (RECTOCELE);  Surgeon: Albino Hum, MD;  Location: [facility];  Service: Gynecology;  Laterality: N/A;   APPENDECTOMY     BRAIN SURGERY     CATARACT EXTRACTION W/PHACO  12/05/2011   Procedure: CATARACT EXTRACTION PHACO AND INTRAOCULAR LENS PLACEMENT (IOC);  Surgeon: Anner Kill;  Location: [facility];  Service: Ophthalmology;  Laterality: Left;  CDE:9.65   CHOLECYSTECTOMY     CRANIOTOMY N/A 06/04/2020   Procedure: Endoscopic Transphenoidal Resection of Recurrent PituitaryTumor;  Surgeon: Elna Haggis, MD;  Location: [facility];  Service: Neurosurgery;  Laterality: N/A;   CYSTOSCOPY     ENDOVENOUS  ABLATION SAPHENOUS VEIN W/ LASER  11-03-2011   right greater saphenous vein   left leg done 10-2011   EYE SURGERY  98   right cataract extraction 98   LAPAROSCOPIC NISSEN FUNDOPLICATION     NM ESOPHAGEAL REFLUX  08-11-11   PITUITARY EXCISION  10/1997   POSTERIOR REPAIR     TRANSNASAL APPROACH N/A 06/04/2020   Procedure: TRANSNASAL APPROACH;  Surgeon: Ammon Bales, MD;  Location: [facility];  Service: ENT;  Laterality: N/A;   TRANSPHENOIDAL / TRANSNASAL HYPOPHYSECTOMY / RESECTION PITUITARY TUMOR  08-11-11    OB History   No obstetric history on file.      Home Medications    Prior to Admission medications   Medication Sig Start Date End Date Taking? Authorizing Provider  acetaminophen  (TYLENOL ) 325 MG tablet Take 2 tablets (650 mg total) by mouth every 6 (six) hours as needed for mild pain (pain score 1-3) (or Fever >/= 101). 01/01/24   Shahmehdi, Constantino Demark, MD  albuterol  (VENTOLIN  HFA) 108 (90 Base) MCG/ACT inhaler Inhale 2 puffs into the lungs every 4 (four) hours as needed for wheezing or shortness of breath. 05/01/24   Johnson, Clanford L, MD  aspirin  EC 81 MG tablet Take 81 mg by mouth daily. Swallow whole.    [provider]  atorvastatin  (LIPITOR) 40 MG tablet Take 1 tablet (40 mg total) by mouth daily. 03/20/24   Colin Dawley, MD  baclofen  (LIORESAL ) 10 MG tablet Take 10 mg by mouth at bedtime. 02/14/24   [provider]  clopidogrel  (PLAVIX ) 75 MG tablet Take 1 tablet (75 mg total) by mouth daily. take Aspirin  81 mg daily along with Plavix  75 mg daily for 21 days then after that STOP the Aspirin   and continue ONLY Plavix  75 mg daily indefinitely--for secondary stroke Prevention (Per The multicenter SAMMPRIS trial) 03/20/24   Colin Dawley, MD  cycloSPORINE  (RESTASIS ) 0.05 % ophthalmic emulsion Place 1 drop into both eyes 2 (two) times daily.    [provider]  dextromethorphan  (DELSYM ) 30 MG/5ML liquid Take 5 mLs (30 mg total) by mouth 2 (two) times daily  as needed for cough. 05/01/24   Johnson, Clanford L, MD  fluticasone  furoate-vilanterol (BREO ELLIPTA ) 200-25 MCG/ACT AEPB Inhale 1 puff into the lungs daily. 03/19/24   Colin Dawley, MD  furosemide  (LASIX ) 20 MG tablet Take 1 tablet (20 mg total) by mouth daily. 05/03/24 05/03/25  Johnson, Clanford L, MD  ipratropium-albuterol  (DUONEB) 0.5-2.5 (3) MG/3ML SOLN Take 3 mLs by nebulization every 4 (four) hours as needed (wheezing, shortness of breath). 05/01/24   Johnson, Clanford L, MD  levothyroxine  (SYNTHROID ) 50 MCG tablet Take 50 mcg by mouth daily.    [provider]  metFORMIN  (GLUCOPHAGE ) 500 MG tablet Take 500 mg by mouth 2 (two) times daily. 04/16/24   [provider]  Multiple Vitamins-Minerals (MULTIVITAMINS THER. W/MINERALS) TABS Take 1 tablet by mouth every morning.    [provider]  omeprazole (PRILOSEC) 20 MG capsule Take 20 mg by mouth every morning.    [provider]  polyethylene glycol (MIRALAX  / GLYCOLAX ) 17 g packet Take 17 g by mouth daily as needed for mild constipation. 05/01/24   Rayfield Cairo, MD  potassium  chloride (KLOR-CON  M) 10 MEQ tablet Take 1 tablet (10 mEq total) by mouth daily. 05/03/24   Johnson, Clanford L, MD  pregabalin  (LYRICA ) 150 MG capsule Take 1 capsule (150 mg total) by mouth 2 (two) times daily. 03/19/24   Colin Dawley, MD  rizatriptan  (MAXALT ) 10 MG tablet Take 10 mg by mouth every 2 (two) hours as needed for migraine. No more than 2 doses in 24 hours 04/16/24   [provider]  senna-docusate (SENOKOT-S) 8.6-50 MG tablet Take 2 tablets by mouth at bedtime. 03/19/24 03/19/25  Colin Dawley, MD  traZODone  (DESYREL ) 50 MG tablet Take 1 tablet (50 mg total) by mouth at bedtime as needed for sleep. 03/19/24   Colin Dawley, MD  TRINTELLIX  10 MG TABS tablet Take 10 mg by mouth daily. 03/09/24   [provider]  venlafaxine  (EFFEXOR -XR) 150 MG 24 hr capsule Take 150 mg by mouth every morning.    [provider]  Vibegron (GEMTESA) 75 MG TABS Take 75 mg by mouth at bedtime.    [provider]    Family History Family History  Problem Relation Age of Onset   Heart disease Mother    Asthma Mother    COPD Father    Heart disease Father    Stroke Father    Asthma Daughter    Migraines Daughter    Neuropathy Son    Cancer Neg Hx    Anesthesia problems Neg Hx    Hypotension Neg Hx    Malignant hyperthermia Neg Hx    Pseudochol deficiency Neg Hx     Social History Social History   Tobacco Use   Smoking status: Never   Smokeless tobacco: Never  Vaping Use   Vaping status: Never Used  Substance Use Topics   Alcohol use: No    Alcohol/week: 0.0 standard drinks of alcohol   Drug use: No     Allergies   Betadine [povidone iodine] and Penicillins   Review of Systems Review of Systems Per HPI  Physical Exam Triage Vital Signs ED Triage Vitals  Encounter Vitals Group     BP 05/10/24 1133 94/62     Systolic BP Percentile --      Diastolic BP Percentile --      Pulse Rate 05/10/24 1133 (!) 113     Resp 05/10/24 1133 20     Temp 05/10/24 1133 98.6 F (37 C)     Temp Source 05/10/24 1133 Oral     SpO2 05/10/24 1133 96 %     Weight --      Height --      Head Circumference --      Peak Flow --      Pain Score 05/10/24 1134 0     Pain Loc --      Pain Education --      Exclude from Growth Chart --    No data found.  Updated Vital Signs BP 103/69 (BP Location: Right Arm)   Pulse (!) 119   Temp 98.6 F (37 C) (Oral)   Resp (!) 22   SpO2 98%   Visual Acuity Right Eye Distance:   Left Eye Distance:   Bilateral Distance:    Right Eye Near:   Left Eye Near:    Bilateral Near:     Physical Exam Vitals and nursing note reviewed.  Constitutional:      Appearance: Normal appearance.  HENT:     Head: Atraumatic.     Right  Ear: Tympanic membrane and external ear normal.     Left Ear: Tympanic membrane and external ear normal.     Nose: Nose  normal.     Mouth/Throat:     Mouth: Mucous membranes are moist.     Pharynx: Oropharynx is clear. No oropharyngeal exudate.  Eyes:     Extraocular Movements: Extraocular movements intact.     Conjunctiva/sclera: Conjunctivae normal.  Cardiovascular:     Rate and Rhythm: Regular rhythm. Tachycardia present.     Heart sounds: Normal heart sounds.  Pulmonary:     Breath sounds: Wheezing present.     Comments: Tachypneic, audible wheezes and crackles even without auscultation, unable to finish complete sentences without becoming winded Musculoskeletal:     Cervical back: Normal range of motion and neck supple.     Comments: In wheelchair, unable to stand unassisted.  Unclear baseline  Skin:    General: Skin is warm and dry.  Neurological:     Mental Status: She is alert. Mental status is at baseline.  Psychiatric:        Mood and Affect: Mood normal.        Thought Content: Thought content normal.      UC Treatments / Results  Labs (all labs ordered are listed, but only abnormal results are displayed) Labs Reviewed - No data to display  EKG   Radiology No results found.  Procedures Procedures (including critical care time)  Medications Ordered in UC Medications  ipratropium-albuterol  (DUONEB) 0.5-2.5 (3) MG/3ML nebulizer solution 3 mL (3 mLs Nebulization Given 05/10/24 1243)    Initial Impression / Assessment and Plan / UC Course  I have reviewed the triage vital signs and the nursing notes.  Pertinent labs & imaging results that were available during my care of the patient were reviewed by me and considered in my medical decision making (see chart for details).     DuoNeb treatment was given after initial examination as patient did have some wheezing, slight tachypnea.  She was unable to perform chest x-ray today as she is unable to stand unassisted.  On recheck after DuoNeb, patient appears significantly worse, much more tachypneic, unable to speak in full  sentences, wheezing, worsening weakness, tachycardic.  Due to recent severe illness, worsening symptoms and complexity of patient's case recommend emergency department evaluation.  Patient and family member with her today are agreeable to EMS transport due to unstable vital signs.  EMS came for transport.  Final Clinical Impressions(s) / UC Diagnoses   Final diagnoses:  Acute cough  Tachycardia  Tachypnea  SOB (shortness of breath)  History of pneumonia  Weakness   Discharge Instructions   None    ED Prescriptions   None    PDMP not reviewed this encounter.   Corbin Dess, [place] 05/10/24 1636

## 2024-05-10 NOTE — ED Provider Notes (Signed)
 [place] EMERGENCY DEPARTMENT AT [facility] Provider Note   CSN: [id number] Arrival date & time: 05/10/24  1406     History  Chief Complaint  Patient presents with   Cough   Shortness of Breath   Weakness    [name] White is a 83 y.o. female.   Cough Associated symptoms: shortness of breath   Shortness of Breath Associated symptoms: cough   Weakness Associated symptoms: cough and shortness of breath      This patient is an 83 year old female with a known history of coronary disease on Plavix , hypertension, diabetes as well as hypothyroidism and lung disease.  The patient was on 2 to 3 L in the hospital, oxygen was over 92%, patient was discharged with prednisone  for an additional 5 days and weaned off the oxygen.  Pneumonia had completed IV antibiotics prior to discharge.  The urgent care noted the patient be hypoxic tachypneic and tachycardic, called for paramedic transport, the paramedics make sure that there was multiple nebs given including there was about 5 albuterol  treatments prior to arrival and 1 DuoNeb, no steroids were given.  Home Medications Prior to Admission medications   Medication Sig Start Date End Date Taking? Authorizing Provider  acetaminophen  (TYLENOL ) 325 MG tablet Take 2 tablets (650 mg total) by mouth every 6 (six) hours as needed for mild pain (pain score 1-3) (or Fever >/= 101). 01/01/24   Shahmehdi, Constantino Demark, MD  albuterol  (VENTOLIN  HFA) 108 (90 Base) MCG/ACT inhaler Inhale 2 puffs into the lungs every 4 (four) hours as needed for wheezing or shortness of breath. 05/01/24   Johnson, Clanford L, MD  aspirin  EC 81 MG tablet Take 81 mg by mouth daily. Swallow whole.    [provider]  atorvastatin  (LIPITOR) 40 MG tablet Take 1 tablet (40 mg total) by mouth daily. 03/20/24   Colin Dawley, MD  baclofen  (LIORESAL ) 10 MG tablet Take 10 mg by mouth at bedtime. 02/14/24   [provider]  clopidogrel  (PLAVIX ) 75 MG tablet Take 1  tablet (75 mg total) by mouth daily. take Aspirin  81 mg daily along with Plavix  75 mg daily for 21 days then after that STOP the Aspirin   and continue ONLY Plavix  75 mg daily indefinitely--for secondary stroke Prevention (Per The multicenter SAMMPRIS trial) 03/20/24   Colin Dawley, MD  cycloSPORINE  (RESTASIS ) 0.05 % ophthalmic emulsion Place 1 drop into both eyes 2 (two) times daily.    [provider]  dextromethorphan  (DELSYM ) 30 MG/5ML liquid Take 5 mLs (30 mg total) by mouth 2 (two) times daily as needed for cough. 05/01/24   Johnson, Clanford L, MD  fluticasone  furoate-vilanterol (BREO ELLIPTA ) 200-25 MCG/ACT AEPB Inhale 1 puff into the lungs daily. 03/19/24   Colin Dawley, MD  furosemide  (LASIX ) 20 MG tablet Take 1 tablet (20 mg total) by mouth daily. 05/03/24 05/03/25  Johnson, Clanford L, MD  ipratropium-albuterol  (DUONEB) 0.5-2.5 (3) MG/3ML SOLN Take 3 mLs by nebulization every 4 (four) hours as needed (wheezing, shortness of breath). 05/01/24   Johnson, Clanford L, MD  levothyroxine  (SYNTHROID ) 50 MCG tablet Take 50 mcg by mouth daily.    [provider]  metFORMIN  (GLUCOPHAGE ) 500 MG tablet Take 500 mg by mouth 2 (two) times daily. 04/16/24   [provider]  Multiple Vitamins-Minerals (MULTIVITAMINS THER. W/MINERALS) TABS Take 1 tablet by mouth every morning.    [provider]  omeprazole (PRILOSEC) 20 MG capsule Take 20 mg by mouth every morning.  [provider]  polyethylene glycol (MIRALAX  / GLYCOLAX ) 17 g packet Take 17 g by mouth daily as needed for mild constipation. 05/01/24   Johnson, Clanford L, MD  potassium chloride  (KLOR-CON  M) 10 MEQ tablet Take 1 tablet (10 mEq total) by mouth daily. 05/03/24   Johnson, Clanford L, MD  pregabalin  (LYRICA ) 150 MG capsule Take 1 capsule (150 mg total) by mouth 2 (two) times daily. 03/19/24   Colin Dawley, MD  rizatriptan  (MAXALT ) 10 MG tablet Take 10 mg by mouth every 2 (two) hours as needed for  migraine. No more than 2 doses in 24 hours 04/16/24   [provider]  senna-docusate (SENOKOT-S) 8.6-50 MG tablet Take 2 tablets by mouth at bedtime. 03/19/24 03/19/25  Colin Dawley, MD  traZODone  (DESYREL ) 50 MG tablet Take 1 tablet (50 mg total) by mouth at bedtime as needed for sleep. 03/19/24   Colin Dawley, MD  TRINTELLIX  10 MG TABS tablet Take 10 mg by mouth daily. 03/09/24   [provider]  venlafaxine  (EFFEXOR -XR) 150 MG 24 hr capsule Take 150 mg by mouth every morning.    [provider]  Vibegron (GEMTESA) 75 MG TABS Take 75 mg by mouth at bedtime.    [provider]      Allergies    Betadine [povidone iodine] and Penicillins    Review of Systems   Review of Systems  Respiratory:  Positive for cough and shortness of breath.   Neurological:  Positive for weakness.  All other systems reviewed and are negative.   Physical Exam Updated Vital Signs BP 107/64   Pulse 95   Temp (!) 97.5 F (36.4 C)   Resp 20   Ht 1.651 m (5\' 5" )   Wt 58 kg   SpO2 97%   BMI 21.28 kg/m  Physical Exam Vitals and nursing note reviewed.  Constitutional:      General: She is in acute distress.     Appearance: She is well-developed.  HENT:     Head: Normocephalic and atraumatic.     Mouth/Throat:     Pharynx: No oropharyngeal exudate.  Eyes:     General: No scleral icterus.       Right eye: No discharge.        Left eye: No discharge.     Conjunctiva/sclera: Conjunctivae normal.     Pupils: Pupils are equal, round, and reactive to light.  Neck:     Thyroid : No thyromegaly.     Vascular: No JVD.  Cardiovascular:     Rate and Rhythm: Tachycardia present. Rhythm irregular.     Heart sounds: Normal heart sounds. No murmur heard.    No friction rub. No gallop.  Pulmonary:     Effort: Respiratory distress present.     Breath sounds: Wheezing and rales present.  Abdominal:     General: Bowel sounds are normal. There is no distension.      Palpations: Abdomen is soft. There is no mass.     Tenderness: There is no abdominal tenderness.  Musculoskeletal:        General: No tenderness. Normal range of motion.     Cervical back: Normal range of motion and neck supple.     Right lower leg: No edema.     Left lower leg: No edema.  Lymphadenopathy:     Cervical: No cervical adenopathy.  Skin:    General: Skin is warm and dry.     Findings: No erythema or rash.  Neurological:  General: No focal deficit present.     Mental Status: She is alert.     Coordination: Coordination normal.  Psychiatric:        Behavior: Behavior normal.     ED Results / Procedures / Treatments   Labs (all labs ordered are listed, but only abnormal results are displayed) Labs Reviewed  LACTIC ACID, PLASMA - Abnormal; Notable for the following components:      Result Value   Lactic Acid, Venous 3.4 (*)    All other components within normal limits  COMPREHENSIVE METABOLIC PANEL WITH GFR - Abnormal; Notable for the following components:   Sodium 131 (*)    Chloride 94 (*)    Glucose, Bld 222 (*)    Calcium  8.4 (*)    Total Protein 6.2 (*)    Albumin 3.0 (*)    All other components within normal limits  CBC WITH DIFFERENTIAL/PLATELET - Abnormal; Notable for the following components:   WBC 17.2 (*)    RDW 16.2 (*)    Neutro Abs 12.5 (*)    Monocytes Absolute 1.1 (*)    Eosinophils Absolute 0.6 (*)    Abs Immature Granulocytes 0.24 (*)    All other components within normal limits  RESP PANEL BY RT-PCR (RSV, FLU A&B, COVID)  RVPGX2  CULTURE, BLOOD (ROUTINE X 2)  CULTURE, BLOOD (ROUTINE X 2)  PROTIME-INR  LACTIC ACID, PLASMA  URINALYSIS, W/ REFLEX TO CULTURE (INFECTION SUSPECTED)    EKG EKG Interpretation Date/Time:  Friday May 10 2024 14:39:24 EDT Ventricular Rate:  105 PR Interval:  130 QRS Duration:  92 QT Interval:  361 QTC Calculation: 478 R Axis:   67  Text Interpretation: Sinus tachycardia Confirmed by Early Glisson  [id number]) on 05/10/2024 2:43:08 PM  Radiology DG Chest Port 1 View Result Date: 05/10/2024 CLINICAL DATA:  Questionable sepsis EXAM: PORTABLE CHEST 1 VIEW COMPARISON:  Chest x-ray 04/26/2024.  Chest CT 04/24/2004. FINDINGS: The heart size and mediastinal contours are within normal limits. Both lungs are clear. The visualized skeletal structures are unremarkable. IMPRESSION: No active disease. Electronically Signed   By: Tyron Gallon M.D.   On: 05/10/2024 15:17    Procedures .Critical Care  Performed by: Early Glisson, MD Authorized by: Early Glisson, MD   Critical care provider statement:    Critical care time (minutes):  45   Critical care time was exclusive of:  Separately billable procedures and treating other patients   Critical care was necessary to treat or prevent imminent or life-threatening deterioration of the following conditions:  Respiratory failure and sepsis   Critical care was time spent personally by me on the following activities:  Development of treatment plan with patient or surrogate, discussions with consultants, evaluation of patient's response to treatment, examination of patient, obtaining history from patient or surrogate, review of old charts, re-evaluation of patient's condition, pulse oximetry, ordering and review of radiographic studies, ordering and review of laboratory studies and ordering and performing treatments and interventions   I assumed direction of critical care for this patient from another provider in my specialty: no     Care discussed with: admitting provider   Comments:           Medications Ordered in ED Medications  lactated ringers  infusion ( Intravenous New Bag/Given 05/10/24 1451)  vancomycin  (VANCOCIN ) IVPB 1000 mg/200 mL premix (0 mg Intravenous Stopped 05/10/24 1608)  ceFEPIme  (MAXIPIME ) 2 g in sodium chloride  0.9 % 100 mL IVPB (0 g Intravenous Stopped 05/10/24 1608)  ED Course/ Medical Decision Making/ A&P                                  Medical Decision Making Amount and/or Complexity of Data Reviewed Labs: ordered. Radiology: ordered.  Risk Prescription drug management. Decision regarding hospitalization.    This patient presents to the ED for concern of shortness of breath, this involves an extensive number of treatment options, and is a complaint that carries with it a high risk of complications and morbidity.  The differential diagnosis includes sepsis, recurrent pneumonia, aspiration, pneumothorax, pneumonia, COPD, congestive heart failure   Co morbidities that complicate the patient evaluation  Diabetes, underlying lung disease   Additional history obtained:  Additional history obtained from medical record External records from outside source obtained and reviewed including charge summary from prior visit 2 weeks ago Echocardiogram from Apr 27, 2024 showed normal ejection fraction indeterminate left ventricular diastolic parameters.   Lab Tests:  I Ordered, and personally interpreted labs.  The pertinent results include: Leukocytosis over 17,000, lactic acid of 3-1/2   Imaging Studies ordered:  I ordered imaging studies including chest x-ray I independently visualized and interpreted imaging which showed no acute findings I agree with the radiologist interpretation   Cardiac Monitoring: / EKG:  The patient was maintained on a cardiac monitor.  I personally viewed and interpreted the cardiac monitored which showed an underlying rhythm of: Sinus tachycardia   Problem List / ED Course / Critical interventions / Medication management  Patient has increased work of breathing, respiratory distress with hypoxia, lactic acid is elevated and a leukocytosis with a tachycardia, this is a syndrome consistent with what appears to be sepsis and given the elevated lactic acid we will treat for severe sepsis. I ordered medication including a biotics for recurrent pneumonia most likely Reevaluation of the  patient after these medicines showed that the patient ill but improving I have reviewed the patients home medicines and have made adjustments as needed   Consultations Obtained:  I requested consultation with the hospitalist,  and discussed lab and imaging findings as well as pertinent plan - they recommend: Admission   Social Determinants of Health:  Elderly, nursing facility   Test / Admission - Considered:  High level of care         Final Clinical Impression(s) / ED Diagnoses Final diagnoses:  Severe sepsis (HCC)  Community acquired pneumonia, unspecified laterality  Acute respiratory failure with hypoxia (HCC)    Rx / DC Orders ED Discharge Orders     None         Early Glisson, MD 05/10/24 1612

## 2024-05-10 NOTE — ED Triage Notes (Signed)
 Pt to ER via EMS from UC, pt is resident of [facility]. Pt has had "pneumonia" for last week.  States initially got better and now is doing worse. Pt with noted SHOB, cough, wheezes.  Pt has received 2 duonebs, 1 for UC and 1 foe EMS.

## 2024-05-10 NOTE — ED Notes (Signed)
[facility] here to transport patient 

## 2024-05-10 NOTE — ED Notes (Signed)
 Patient is being discharged from the Urgent Care and sent to the Emergency Department via [facility] . Per PA, patient is in need of higher level of care due to SOB, congestion, possible sepsis. Patient is aware and verbalizes understanding of plan of care.  Vitals:   05/10/24 1308 05/10/24 1332  BP:  103/69  Pulse:  (!) 119  Resp:  (!) 22  Temp:    SpO2: 95% 98%

## 2024-05-10 NOTE — ED Notes (Signed)
 Pt lying in bed with family at bedside. Denies pain at this time. Non productive moist cough present. Oxygen saturation above 94% on RA.

## 2024-05-10 NOTE — ED Notes (Signed)
 Urine canister is empty. Still unable to obtain urine sample. Periwick checked for misplacement.

## 2024-05-10 NOTE — Sepsis Progress Note (Signed)
 Secure chat with bedside RNs Peterson Brandt and [place]. Confirmed that the blood cultures were drawn prior to administering the antibiotic.

## 2024-05-11 DIAGNOSIS — R6520 Severe sepsis without septic shock: Secondary | ICD-10-CM | POA: Diagnosis not present

## 2024-05-11 DIAGNOSIS — A419 Sepsis, unspecified organism: Secondary | ICD-10-CM | POA: Diagnosis not present

## 2024-05-11 LAB — BASIC METABOLIC PANEL WITH GFR
Anion gap: 7 (ref 5–15)
BUN: 12 mg/dL (ref 8–23)
CO2: 25 mmol/L (ref 22–32)
Calcium: 8 mg/dL — ABNORMAL LOW (ref 8.9–10.3)
Chloride: 106 mmol/L (ref 98–111)
Creatinine, Ser: 0.55 mg/dL (ref 0.44–1.00)
GFR, Estimated: >60 mL/min (ref >60)
Glucose, Bld: 124 mg/dL — ABNORMAL HIGH (ref 70–99)
Potassium: 3.9 mmol/L (ref 3.5–5.1)
Sodium: 138 mmol/L (ref 135–145)

## 2024-05-11 LAB — GLUCOSE, CAPILLARY
Glucose-Capillary: 252 mg/dL — ABNORMAL HIGH (ref 70–99)
Glucose-Capillary: 94 mg/dL (ref 70–99)
Glucose-Capillary: 205 mg/dL — ABNORMAL HIGH (ref 70–99)
Glucose-Capillary: 237 mg/dL — ABNORMAL HIGH (ref 70–99)
Glucose-Capillary: 237 mg/dL — ABNORMAL HIGH (ref 70–99)

## 2024-05-11 LAB — CBC
HCT: 34.5 % — ABNORMAL LOW (ref 36.0–46.0)
Hemoglobin: 11.7 g/dL — ABNORMAL LOW (ref 12.0–15.0)
MCH: 30.6 pg (ref 26.0–34.0)
MCHC: 33.9 g/dL (ref 30.0–36.0)
MCV: 90.3 fL (ref 80.0–100.0)
Platelets: 238 10*3/uL (ref 150–400)
RBC: 3.82 MIL/uL — ABNORMAL LOW (ref 3.87–5.11)
RDW: 16.5 % — ABNORMAL HIGH (ref 11.5–15.5)
WBC: 14.7 10*3/uL — ABNORMAL HIGH (ref 4.0–10.5)
nRBC: 0 % (ref 0.0–0.2)

## 2024-05-11 LAB — STREP PNEUMONIAE URINARY ANTIGEN: Strep Pneumo Urinary Antigen: NEGATIVE

## 2024-05-11 LAB — MRSA NEXT GEN BY PCR, NASAL: MRSA by PCR Next Gen: NOT DETECTED

## 2024-05-11 MED ORDER — DM-GUAIFENESIN ER 30-600 MG PO TB12
1.0000 | ORAL_TABLET | Freq: Two times a day (BID) | ORAL | Status: DC
  Administered 2024-05-11 – 2024-05-15 (×9): 1 via ORAL
  Filled 2024-05-11 (×9): qty 1

## 2024-05-11 NOTE — Progress Notes (Signed)
   05/11/24 1704  TOC Assessment  TOC screening is complete Yes  Once discharged, how will the patient get to their discharge location? Ambulance  Expected Discharge Plan Long Term Nursing Home  Final next level of care Long Term Nursing Home  Living arrangements for the past 2 months Skilled Nursing Facility  Lives with: Facility Resident  Share Information with [name]  Permission granted to share info w Contact Information [phone]  Appearance: Appears stated age  Attitude/Demeanor/Rapport Engaged  Affect (typically observed) Stable  Orientation:  Oriented to Self;Oriented to Place;Oriented to  Time;Oriented to Situation    Admitted with Severe sepsis.Transition of Care Department [facility]) has reviewed patient. TOC will continue to monitor patient advancement through interdisciplinary progressions rounds.

## 2024-05-11 NOTE — TOC Initial Note (Signed)
 Transition of Care [facility]) - Initial/Assessment Note    Patient Details  Name: [name] White MRN: [id number] Date of Birth: 1941/10/21  Transition of Care [facility]) CM/SW Contact:    Lynda Sands, RN Phone Number: 05/11/2024, 5:17 PM  Clinical Narrative:      CM met with patient a bedside report she lives at an assisted living at the [facility]. Patient reports she needs assistance with activities of daily living. Patient reports she ambulates with a walker. DME: walker and wheelchair.             Expected Discharge Plan: Long Term Nursing Home    Patient Goals and CMS Choice  Patient will return to [facility]   Expected Discharge Plan and Services       Living arrangements for the past 2 months: Skilled Nursing Facility                   Prior Living Arrangements/Services Living arrangements for the past 2 months: Skilled Nursing Facility Lives with:: Facility Resident     Activities of Daily Living   ADL Screening (condition at time of admission) Independently performs ADLs?: No Does the patient have a NEW difficulty with bathing/dressing/toileting/self-feeding that is expected to last >3 days?: No Does the patient have a NEW difficulty with getting in/out of bed, walking, or climbing stairs that is expected to last >3 days?: No Does the patient have a NEW difficulty with communication that is expected to last >3 days?: No Is the patient deaf or have difficulty hearing?: No Does the patient have difficulty seeing, even when wearing glasses/contacts?: No Does the patient have difficulty concentrating, remembering, or making decisions?: No  Permission Sought/Granted      Share Information with NAME: Carlen Chasten        Permission granted to share info w Contact Information: [phone]  Emotional Assessment Appearance:: Appears stated age Attitude/Demeanor/Rapport: Engaged Affect (typically observed): Stable Orientation: : Oriented to  Self, Oriented to Place, Oriented to  Time, Oriented to Situation      Admission diagnosis:  Acute respiratory failure with hypoxia (HCC) [J96.01] Severe sepsis (HCC) [A41.9, R65.20] Acute on chronic respiratory failure with hypoxia (HCC) [J96.21] Community acquired pneumonia, unspecified laterality [J18.9] Patient Active Problem List   Diagnosis Date Noted   Severe sepsis (HCC) 05/10/2024   Acute on chronic respiratory failure with hypoxia (HCC) 05/10/2024   Hypomagnesemia 05/01/2024   Septic shock (HCC) 04/24/2024   CAP (community acquired pneumonia) 04/24/2024   Acute respiratory failure with hypoxia (HCC) 04/24/2024   CVA (cerebral vascular accident) (HCC) 03/12/2024   Acute lower UTI 03/12/2024   Type 2 diabetes mellitus with peripheral neuropathy (HCC) 03/12/2024   Anxiety and depression 03/12/2024   GERD without esophagitis 03/12/2024   Acute encephalopathy 03/11/2024   Rhabdomyolysis 12/27/2023   Pressure injury of skin 12/27/2023   UTI (urinary tract infection) 12/26/2023   Sepsis due to urinary tract infection (HCC) 12/26/2023   Acute metabolic encephalopathy 12/26/2023   Status post transsphenoidal pituitary resection (HCC) 06/04/2020   Pituitary adenoma with extrasellar extension (HCC) 06/04/2020   Pituitary tumor 05/29/2020   Dizzinesses 02/02/2020   Acute pain of right shoulder 02/02/2020   Encounter for screening mammogram for malignant neoplasm of breast 01/28/2020   Vitamin D  deficiency 01/28/2020   Paroxysmal supraventricular tachycardia (HCC) 06/19/2014   Rectocele 03/19/2013   Constipation 03/19/2013   HYPERCHOLESTEROLEMIA 02/09/2009   Essential hypertension 02/09/2009   Osteoporosis 02/09/2009   Post-menopausal 02/09/2009  HEAT INTOLERANCE 02/01/2008   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   Hypothyroidism 09/14/2007   Diabetes (HCC) 09/14/2007   Migraine headache 09/14/2007   SUPERFICIAL PHLEBITIS 09/14/2007   PANCREATITIS 09/14/2007   MENOPAUSAL SYNDROME  09/14/2007   FATIGUE 09/14/2007   PCP:  Omie Bickers, MD Pharmacy:   [facility], [place] - [address] [place] [address] Phone: [phone] Fax: [phone]     Social Drivers of Health (SDOH) Social History: SDOH Screenings   Food Insecurity: No Food Insecurity (05/10/2024)  Housing: High Risk (05/10/2024)  Transportation Needs: No Transportation Needs (05/10/2024)  Utilities: Not At Risk (05/10/2024)  Depression (PHQ2-9): Low Risk  (01/28/2020)  Social Connections: Socially Isolated (05/10/2024)  Tobacco Use: Low Risk  (05/10/2024)   SDOH Interventions:     Readmission Risk Interventions    04/25/2024    1:46 PM 12/27/2023    1:21 PM  Readmission Risk Prevention Plan  Transportation Screening Complete Complete  PCP or Specialist Appt within 3-5 Days Not Complete   Home Care Screening  Complete  Medication Review (RN CM)  Complete  HRI or Home Care Consult Complete   Social Work Consult for Recovery Care Planning/Counseling Complete   Palliative Care Screening Not Complete   Medication Review Oceanographer) Complete

## 2024-05-11 NOTE — Progress Notes (Signed)
 PROGRESS NOTE    [name] White  JXB:[id number] DOB: 1941-09-04 DOA: 05/10/2024 PCP: Omie Bickers, MD   Brief Narrative:    [name] White is a 83 y.o. female with medical history significant of diabetes, hypertension, hypothyroidism, chronic pain.  Patient presents from skilled nursing facility due to increasing shortness of breath that started this morning.  She was admitted here before at the beginning of the month for sepsis from pneumonia that resulted in ICU stay and needing to be on pressors.  She was discharged on antibiotics and steroids for an additional 5 days.  She has now been readmitted with severe sepsis secondary to findings of pneumonia and started on IV antibiotics, but appears to have more of a COPD exacerbation picture.  Assessment & Plan:   Principal Problem:   Severe sepsis (HCC) Active Problems:   CAP (community acquired pneumonia)   CVA (cerebral vascular accident) (HCC)   Hypothyroidism   Diabetes (HCC)   Essential hypertension   Acute on chronic respiratory failure with hypoxia (HCC)  Assessment and Plan:   Acute hypoxemic respiratory failure secondary to COPD exacerbation with possible atypical pneumonia - Continue on azithromycin  and discontinue cefepime  with procalcitonin low - Leukocytosis likely related to steroid use - Continue IV Solu-Medrol  as ordered along with breathing treatments - Mucinex  for congestion - Follow lactic acid - Currently on 2 L nasal cannula and typically on room air, wean as tolerated - Tolerating diet, discontinue further IV fluid  History of CVA -Continue aspirin  and Plavix   Hypertension -Hold antihypertensives due to soft blood pressure readings  Type 2 diabetes -Continue SSI and Lantus  -CBG monitoring  Hypothyroidism -Levothyroxine     DVT prophylaxis:Lovenox  Code Status: DNR Family Communication: Son at bedside 12/17 Disposition Plan:  Status is: Inpatient Remains inpatient appropriate because: Need for IV  medications.   Consultants:  None  Procedures:  None  Antimicrobials:  Anti-infectives (From admission, onward)    Start     Dose/Rate Route Frequency Ordered Stop   05/11/24 0300  ceFEPIme  (MAXIPIME ) 2 g in sodium chloride  0.9 % 100 mL IVPB  Status:  Discontinued        2 g 200 mL/hr over 30 Minutes Intravenous Every 12 hours 05/10/24 1859 05/11/24 1154   05/10/24 1945  azithromycin  (ZITHROMAX ) 500 mg in sodium chloride  0.9 % 250 mL IVPB        500 mg 250 mL/hr over 60 Minutes Intravenous Every 24 hours 05/10/24 1859 05/15/24 1944   05/10/24 1445  vancomycin  (VANCOCIN ) IVPB 1000 mg/200 mL premix        1,000 mg 200 mL/hr over 60 Minutes Intravenous  Once 05/10/24 1432 05/10/24 1608   05/10/24 1445  ceFEPIme  (MAXIPIME ) 2 g in sodium chloride  0.9 % 100 mL IVPB        2 g 200 mL/hr over 30 Minutes Intravenous  Once 05/10/24 1432 05/10/24 1608      Subjective: Patient seen and evaluated today with ongoing congestion and mild shortness of breath.  No acute overnight events since admission noted.  Objective: Vitals:   05/10/24 1847 05/10/24 2156 05/11/24 0150 05/11/24 0552  BP: 106/69 (!) 87/55 (!) 92/58 96/60  Pulse: (!) 106 98 81 80  Resp:  20 17 18   Temp: 98.1 F (36.7 C) 97.6 F (36.4 C) 97.7 F (36.5 C) 97.7 F (36.5 C)  TempSrc:  Oral Axillary Oral  SpO2: 100% 100% 100% 100%  Weight: 58.9 kg     Height: 5\' 5"  (1.651 m)  Intake/Output Summary (Last 24 hours) at 05/11/2024 1200 Last data filed at 05/11/2024 1147 Gross per 24 hour  Intake 1299.58 ml  Output 2200 ml  Net -900.42 ml   Filed Weights   05/10/24 1432 05/10/24 1847  Weight: 58 kg 58.9 kg    Examination:  General exam: Appears calm and comfortable  Respiratory system: Bilateral congestion. Respiratory effort normal.  2 L nasal cannula Cardiovascular system: S1 & S2 heard, RRR.  Gastrointestinal system: Abdomen is soft Central nervous system: Alert and awake Extremities: No edema Skin: No  significant lesions noted Psychiatry: Flat affect.    Data Reviewed: I have personally reviewed following labs and imaging studies  CBC: Recent Labs  Lab 05/10/24 1500 05/11/24 0445  WBC 17.2* 14.7*  NEUTROABS 12.5*  --   HGB 12.8 11.7*  HCT 37.4 34.5*  MCV 88.4 90.3  PLT 263 238   Basic Metabolic Panel: Recent Labs  Lab 05/10/24 1500 05/11/24 0445  NA 131* 138  K 3.7 3.9  CL 94* 106  CO2 23 25  GLUCOSE 222* 124*  BUN 15 12  CREATININE 0.74 0.55  CALCIUM  8.4* 8.0*   GFR: Estimated Creatinine Clearance: 48.8 mL/min (by C-G formula based on SCr of 0.55 mg/dL). Liver Function Tests: Recent Labs  Lab 05/10/24 1500  AST 20  ALT 32  ALKPHOS 47  BILITOT 0.9  PROT 6.2*  ALBUMIN 3.0*   No results for input(s): "LIPASE", "AMYLASE" in the last 168 hours. No results for input(s): "AMMONIA" in the last 168 hours. Coagulation Profile: Recent Labs  Lab 05/10/24 1500  INR 1.1   Cardiac Enzymes: No results for input(s): "CKTOTAL", "CKMB", "CKMBINDEX", "TROPONINI" in the last 168 hours. BNP (last 3 results) No results for input(s): "PROBNP" in the last 8760 hours. HbA1C: No results for input(s): "HGBA1C" in the last 72 hours. CBG: Recent Labs  Lab 05/10/24 2154 05/10/24 2346 05/11/24 0152 05/11/24 0725 05/11/24 1145  GLUCAP 503* 404* 252* 94 205*   Lipid Profile: No results for input(s): "CHOL", "HDL", "LDLCALC", "TRIG", "CHOLHDL", "LDLDIRECT" in the last 72 hours. Thyroid  Function Tests: No results for input(s): "TSH", "T4TOTAL", "FREET4", "T3FREE", "THYROIDAB" in the last 72 hours. Anemia Panel: No results for input(s): "VITAMINB12", "FOLATE", "FERRITIN", "TIBC", "IRON", "RETICCTPCT" in the last 72 hours. Sepsis Labs: Recent Labs  Lab 05/10/24 1500 05/10/24 1649  PROCALCITON  --  <0.10  LATICACIDVEN 3.4* 2.5*    Recent Results (from the past 240 hours)  Resp panel by RT-PCR (RSV, Flu A&B, Covid) Anterior Nasal Swab     Status: None   Collection  Time: 05/10/24  2:53 PM   Specimen: Anterior Nasal Swab  Result Value Ref Range Status   SARS Coronavirus 2 by RT PCR NEGATIVE NEGATIVE Final    Comment: (NOTE) SARS-CoV-2 target nucleic acids are NOT DETECTED.  The SARS-CoV-2 RNA is generally detectable in upper respiratory specimens during the acute phase of infection. The lowest concentration of SARS-CoV-2 viral copies this assay can detect is 138 copies/mL. A negative result does not preclude SARS-Cov-2 infection and should not be used as the sole basis for treatment or other patient management decisions. A negative result may occur with  improper specimen collection/handling, submission of specimen other than nasopharyngeal swab, presence of viral mutation(s) within the areas targeted by this assay, and inadequate number of viral copies(<138 copies/mL). A negative result must be combined with clinical observations, patient history, and epidemiological information. The expected result is Negative.  Fact Sheet for Patients:  BloggerCourse.com  Fact Sheet for Healthcare Providers:  SeriousBroker.it  This test is no t yet approved or cleared by the [place]  FDA and  has been authorized for detection and/or diagnosis of SARS-CoV-2 by FDA under an Emergency Use Authorization (EUA). This EUA will remain  in effect (meaning this test can be used) for the duration of the COVID-19 declaration under Section 564(b)(1) of the Act, 21 U.S.C.section 360bbb-3(b)(1), unless the authorization is terminated  or revoked sooner.       Influenza A by PCR NEGATIVE NEGATIVE Final   Influenza B by PCR NEGATIVE NEGATIVE Final    Comment: (NOTE) The Xpert Xpress SARS-CoV-2/FLU/RSV plus assay is intended as an aid in the diagnosis of influenza from Nasopharyngeal swab specimens and should not be used as a sole basis for treatment. Nasal washings and aspirates are unacceptable for Xpert Xpress  SARS-CoV-2/FLU/RSV testing.  Fact Sheet for Patients: BloggerCourse.com  Fact Sheet for Healthcare Providers: SeriousBroker.it  This test is not yet approved or cleared by the [place]  FDA and has been authorized for detection and/or diagnosis of SARS-CoV-2 by FDA under an Emergency Use Authorization (EUA). This EUA will remain in effect (meaning this test can be used) for the duration of the COVID-19 declaration under Section 564(b)(1) of the Act, 21 U.S.C. section 360bbb-3(b)(1), unless the authorization is terminated or revoked.     Resp Syncytial Virus by PCR NEGATIVE NEGATIVE Final    Comment: (NOTE) Fact Sheet for Patients: BloggerCourse.com  Fact Sheet for Healthcare Providers: SeriousBroker.it  This test is not yet approved or cleared by the [place]  FDA and has been authorized for detection and/or diagnosis of SARS-CoV-2 by FDA under an Emergency Use Authorization (EUA). This EUA will remain in effect (meaning this test can be used) for the duration of the COVID-19 declaration under Section 564(b)(1) of the Act, 21 U.S.C. section 360bbb-3(b)(1), unless the authorization is terminated or revoked.  Performed at [facility], [address], [place], [place] [address]   Blood Culture (routine x 2)     Status: None (Preliminary result)   Collection Time: 05/10/24  3:00 PM   Specimen: BLOOD  Result Value Ref Range Status   Specimen Description BLOOD LEFT ANTECUBITAL  Final   Special Requests   Final    BOTTLES DRAWN AEROBIC AND ANAEROBIC Blood Culture adequate volume   Culture   Final    NO GROWTH < 24 HOURS Performed at [facility], [address] [place], [place] [address]    Report Status PENDING  Incomplete  Blood Culture (routine x 2)     Status: None (Preliminary result)   Collection Time: 05/10/24  3:00 PM   Specimen: BLOOD  Result Value Ref Range  Status   Specimen Description BLOOD BLOOD LEFT HAND  Final   Special Requests   Final    AEROBIC BOTTLE ONLY Blood Culture results may not be optimal due to an inadequate volume of blood received in culture bottles   Culture   Final    NO GROWTH < 24 HOURS Performed at [facility], [address], [place], [place] [address]    Report Status PENDING  Incomplete  MRSA Next Gen by PCR, Nasal     Status: None   Collection Time: 05/11/24  8:30 AM   Specimen: Nasal Mucosa; Nasal Swab  Result Value Ref Range Status   MRSA by PCR Next Gen NOT DETECTED NOT DETECTED Final    Comment: (NOTE) The GeneXpert MRSA Assay (FDA approved for NASAL specimens  only), is one component of a comprehensive MRSA colonization surveillance program. It is not intended to diagnose MRSA infection nor to guide or monitor treatment for MRSA infections. Test performance is not FDA approved in patients less than 59 years old. Performed at [facility], [address] [place], [place] [address]          Radiology Studies: [facility] Chest [facility] 1 View Result Date: 05/10/2024 CLINICAL DATA:  Questionable sepsis EXAM: PORTABLE CHEST 1 VIEW COMPARISON:  Chest x-ray 04/26/2024.  Chest CT 04/24/2004. FINDINGS: The heart size and mediastinal contours are within normal limits. Both lungs are clear. The visualized skeletal structures are unremarkable. IMPRESSION: No active disease. Electronically Signed   By: Tyron Gallon M.D.   On: 05/10/2024 15:17        Scheduled Meds:  aspirin  EC  81 mg Oral Daily   atorvastatin   40 mg Oral Daily   baclofen   10 mg Oral QHS   clopidogrel   75 mg Oral Daily   cycloSPORINE   1 drop Both Eyes BID   dextromethorphan -guaiFENesin   1 tablet Oral BID   enoxaparin  (LOVENOX ) injection  40 mg Subcutaneous Q24H   fluticasone  furoate-vilanterol  1 puff Inhalation Daily   insulin  aspart  0-20 Units Subcutaneous TID WC   insulin  aspart  0-5 Units Subcutaneous QHS   insulin  glargine-yfgn  20 Units  Subcutaneous QHS   levothyroxine   50 mcg Oral Q0600   methylPREDNISolone  (SOLU-MEDROL ) injection  60 mg Intravenous Q12H   mirabegron  ER  25 mg Oral Daily   pantoprazole   40 mg Oral Daily   pregabalin   150 mg Oral BID   senna  2 tablet Oral QHS   venlafaxine  XR  150 mg Oral BH-q7a   vortioxetine  HBr  10 mg Oral Daily   Continuous Infusions:  azithromycin  500 mg (05/10/24 2019)     LOS: 1 day    Time spent: 55 minutes    Melquan Ernsberger D Mason Sole, DO [facility]  If 7PM-7AM, please contact night-coverage [email or web address] 05/11/2024, 12:00 PM

## 2024-05-11 NOTE — Plan of Care (Signed)
  Problem: Education: Goal: Knowledge of General Education information will improve Description: Including pain rating scale, medication(s)/side effects and non-pharmacologic comfort measures Outcome: Progressing   Problem: Health Behavior/Discharge Planning: Goal: Ability to manage health-related needs will improve Outcome: Progressing   Problem: Clinical Measurements: Goal: Ability to maintain clinical measurements within normal limits will improve Outcome: Progressing Goal: Will remain free from infection Outcome: Progressing Goal: Diagnostic test results will improve Outcome: Progressing Goal: Respiratory complications will improve Outcome: Progressing Goal: Cardiovascular complication will be avoided Outcome: Progressing   Problem: Activity: Goal: Risk for activity intolerance will decrease Outcome: Progressing   Problem: Nutrition: Goal: Adequate nutrition will be maintained Outcome: Progressing   Problem: Coping: Goal: Level of anxiety will decrease Outcome: Progressing   Problem: Elimination: Goal: Will not experience complications related to bowel motility Outcome: Progressing Goal: Will not experience complications related to urinary retention Outcome: Progressing   Problem: Pain Managment: Goal: General experience of comfort will improve and/or be controlled Outcome: Progressing   Problem: Safety: Goal: Ability to remain free from injury will improve Outcome: Progressing   Problem: Skin Integrity: Goal: Risk for impaired skin integrity will decrease Outcome: Progressing   Problem: Education: Goal: Knowledge of disease or condition will improve Outcome: Progressing Goal: Knowledge of the prescribed therapeutic regimen will improve Outcome: Progressing Goal: Individualized Educational Video(s) Outcome: Progressing   Problem: Activity: Goal: Ability to tolerate increased activity will improve Outcome: Progressing Goal: Will verbalize the  importance of balancing activity with adequate rest periods Outcome: Progressing   Problem: Respiratory: Goal: Ability to maintain a clear airway will improve Outcome: Progressing Goal: Levels of oxygenation will improve Outcome: Progressing Goal: Ability to maintain adequate ventilation will improve Outcome: Progressing   Problem: Activity: Goal: Ability to tolerate increased activity will improve Outcome: Progressing   Problem: Clinical Measurements: Goal: Ability to maintain a body temperature in the normal range will improve Outcome: Progressing   Problem: Respiratory: Goal: Ability to maintain adequate ventilation will improve Outcome: Progressing Goal: Ability to maintain a clear airway will improve Outcome: Progressing   Problem: Education: Goal: Ability to describe self-care measures that may prevent or decrease complications (Diabetes Survival Skills Education) will improve Outcome: Progressing Goal: Individualized Educational Video(s) Outcome: Progressing   Problem: Coping: Goal: Ability to adjust to condition or change in health will improve Outcome: Progressing   Problem: Fluid Volume: Goal: Ability to maintain a balanced intake and output will improve Outcome: Progressing   Problem: Health Behavior/Discharge Planning: Goal: Ability to identify and utilize available resources and services will improve Outcome: Progressing Goal: Ability to manage health-related needs will improve Outcome: Progressing   Problem: Metabolic: Goal: Ability to maintain appropriate glucose levels will improve Outcome: Progressing   Problem: Nutritional: Goal: Maintenance of adequate nutrition will improve Outcome: Progressing Goal: Progress toward achieving an optimal weight will improve Outcome: Progressing   Problem: Skin Integrity: Goal: Risk for impaired skin integrity will decrease Outcome: Progressing   Problem: Tissue Perfusion: Goal: Adequacy of tissue  perfusion will improve Outcome: Progressing

## 2024-05-12 DIAGNOSIS — A419 Sepsis, unspecified organism: Secondary | ICD-10-CM | POA: Diagnosis not present

## 2024-05-12 DIAGNOSIS — R6520 Severe sepsis without septic shock: Secondary | ICD-10-CM | POA: Diagnosis not present

## 2024-05-12 LAB — GLUCOSE, CAPILLARY
Glucose-Capillary: 160 mg/dL — ABNORMAL HIGH (ref 70–99)
Glucose-Capillary: 334 mg/dL — ABNORMAL HIGH (ref 70–99)
Glucose-Capillary: 223 mg/dL — ABNORMAL HIGH (ref 70–99)
Glucose-Capillary: 234 mg/dL — ABNORMAL HIGH (ref 70–99)

## 2024-05-12 LAB — CBC
HCT: 31.8 % — ABNORMAL LOW (ref 36.0–46.0)
Hemoglobin: 10.6 g/dL — ABNORMAL LOW (ref 12.0–15.0)
MCH: 30.4 pg (ref 26.0–34.0)
MCHC: 33.3 g/dL (ref 30.0–36.0)
MCV: 91.1 fL (ref 80.0–100.0)
Platelets: 237 10*3/uL (ref 150–400)
RBC: 3.49 MIL/uL — ABNORMAL LOW (ref 3.87–5.11)
RDW: 16.8 % — ABNORMAL HIGH (ref 11.5–15.5)
WBC: 20.2 10*3/uL — ABNORMAL HIGH (ref 4.0–10.5)
nRBC: 0 % (ref 0.0–0.2)

## 2024-05-12 LAB — BASIC METABOLIC PANEL WITH GFR
Anion gap: 4 — ABNORMAL LOW (ref 5–15)
BUN: 16 mg/dL (ref 8–23)
CO2: 25 mmol/L (ref 22–32)
Calcium: 8 mg/dL — ABNORMAL LOW (ref 8.9–10.3)
Chloride: 108 mmol/L (ref 98–111)
Creatinine, Ser: 0.63 mg/dL (ref 0.44–1.00)
GFR, Estimated: >60 mL/min (ref >60)
Glucose, Bld: 163 mg/dL — ABNORMAL HIGH (ref 70–99)
Potassium: 3.6 mmol/L (ref 3.5–5.1)
Sodium: 137 mmol/L (ref 135–145)

## 2024-05-12 LAB — MAGNESIUM: Magnesium: 1.6 mg/dL — ABNORMAL LOW (ref 1.7–2.4)

## 2024-05-12 LAB — LACTIC ACID, PLASMA: Lactic Acid, Venous: 1.3 mmol/L (ref 0.5–1.9)

## 2024-05-12 MED ORDER — MAGNESIUM SULFATE 2 GM/50ML IV SOLN
2.0000 g | Freq: Once | INTRAVENOUS | Status: AC
  Administered 2024-05-12: 2 g via INTRAVENOUS
  Filled 2024-05-12: qty 50

## 2024-05-12 NOTE — Plan of Care (Signed)
  Problem: Education: Goal: Knowledge of General Education information will improve Description: Including pain rating scale, medication(s)/side effects and non-pharmacologic comfort measures Outcome: Progressing   Problem: Health Behavior/Discharge Planning: Goal: Ability to manage health-related needs will improve Outcome: Progressing   Problem: Clinical Measurements: Goal: Ability to maintain clinical measurements within normal limits will improve Outcome: Progressing Goal: Will remain free from infection Outcome: Progressing Goal: Diagnostic test results will improve Outcome: Progressing Goal: Respiratory complications will improve Outcome: Progressing Goal: Cardiovascular complication will be avoided Outcome: Progressing   Problem: Activity: Goal: Risk for activity intolerance will decrease Outcome: Progressing   Problem: Nutrition: Goal: Adequate nutrition will be maintained Outcome: Progressing   Problem: Coping: Goal: Level of anxiety will decrease Outcome: Progressing   Problem: Elimination: Goal: Will not experience complications related to bowel motility Outcome: Progressing Goal: Will not experience complications related to urinary retention Outcome: Progressing   Problem: Pain Managment: Goal: General experience of comfort will improve and/or be controlled Outcome: Progressing   Problem: Safety: Goal: Ability to remain free from injury will improve Outcome: Progressing   Problem: Skin Integrity: Goal: Risk for impaired skin integrity will decrease Outcome: Progressing   Problem: Education: Goal: Knowledge of disease or condition will improve Outcome: Progressing Goal: Knowledge of the prescribed therapeutic regimen will improve Outcome: Progressing Goal: Individualized Educational Video(s) Outcome: Progressing   Problem: Activity: Goal: Ability to tolerate increased activity will improve Outcome: Progressing Goal: Will verbalize the  importance of balancing activity with adequate rest periods Outcome: Progressing   Problem: Respiratory: Goal: Ability to maintain a clear airway will improve Outcome: Progressing Goal: Levels of oxygenation will improve Outcome: Progressing Goal: Ability to maintain adequate ventilation will improve Outcome: Progressing   Problem: Activity: Goal: Ability to tolerate increased activity will improve Outcome: Progressing   Problem: Clinical Measurements: Goal: Ability to maintain a body temperature in the normal range will improve Outcome: Progressing   Problem: Respiratory: Goal: Ability to maintain adequate ventilation will improve Outcome: Progressing Goal: Ability to maintain a clear airway will improve Outcome: Progressing   Problem: Education: Goal: Ability to describe self-care measures that may prevent or decrease complications (Diabetes Survival Skills Education) will improve Outcome: Progressing Goal: Individualized Educational Video(s) Outcome: Progressing   Problem: Coping: Goal: Ability to adjust to condition or change in health will improve Outcome: Progressing   Problem: Fluid Volume: Goal: Ability to maintain a balanced intake and output will improve Outcome: Progressing   Problem: Health Behavior/Discharge Planning: Goal: Ability to identify and utilize available resources and services will improve Outcome: Progressing Goal: Ability to manage health-related needs will improve Outcome: Progressing   Problem: Metabolic: Goal: Ability to maintain appropriate glucose levels will improve Outcome: Progressing   Problem: Nutritional: Goal: Maintenance of adequate nutrition will improve Outcome: Progressing Goal: Progress toward achieving an optimal weight will improve Outcome: Progressing   Problem: Skin Integrity: Goal: Risk for impaired skin integrity will decrease Outcome: Progressing   Problem: Tissue Perfusion: Goal: Adequacy of tissue  perfusion will improve Outcome: Progressing

## 2024-05-12 NOTE — Plan of Care (Signed)
 Rested well during the night with no complaints.  Problem: Education: Goal: Knowledge of General Education information will improve Description: Including pain rating scale, medication(s)/side effects and non-pharmacologic comfort measures Outcome: Progressing   Problem: Health Behavior/Discharge Planning: Goal: Ability to manage health-related needs will improve Outcome: Progressing   Problem: Clinical Measurements: Goal: Ability to maintain clinical measurements within normal limits will improve Outcome: Progressing Goal: Will remain free from infection Outcome: Progressing Goal: Diagnostic test results will improve Outcome: Progressing Goal: Respiratory complications will improve Outcome: Progressing Goal: Cardiovascular complication will be avoided Outcome: Progressing   Problem: Activity: Goal: Risk for activity intolerance will decrease Outcome: Progressing   Problem: Nutrition: Goal: Adequate nutrition will be maintained Outcome: Progressing   Problem: Coping: Goal: Level of anxiety will decrease Outcome: Progressing   Problem: Elimination: Goal: Will not experience complications related to bowel motility Outcome: Progressing Goal: Will not experience complications related to urinary retention Outcome: Progressing   Problem: Pain Managment: Goal: General experience of comfort will improve and/or be controlled Outcome: Progressing   Problem: Safety: Goal: Ability to remain free from injury will improve Outcome: Progressing   Problem: Skin Integrity: Goal: Risk for impaired skin integrity will decrease Outcome: Progressing   Problem: Education: Goal: Knowledge of disease or condition will improve Outcome: Progressing Goal: Knowledge of the prescribed therapeutic regimen will improve Outcome: Progressing Goal: Individualized Educational Video(s) Outcome: Progressing   Problem: Activity: Goal: Ability to tolerate increased activity will  improve Outcome: Progressing Goal: Will verbalize the importance of balancing activity with adequate rest periods Outcome: Progressing   Problem: Respiratory: Goal: Ability to maintain a clear airway will improve Outcome: Progressing Goal: Levels of oxygenation will improve Outcome: Progressing Goal: Ability to maintain adequate ventilation will improve Outcome: Progressing   Problem: Activity: Goal: Ability to tolerate increased activity will improve Outcome: Progressing   Problem: Clinical Measurements: Goal: Ability to maintain a body temperature in the normal range will improve Outcome: Progressing   Problem: Respiratory: Goal: Ability to maintain adequate ventilation will improve Outcome: Progressing Goal: Ability to maintain a clear airway will improve Outcome: Progressing   Problem: Education: Goal: Ability to describe self-care measures that may prevent or decrease complications (Diabetes Survival Skills Education) will improve Outcome: Progressing Goal: Individualized Educational Video(s) Outcome: Progressing   Problem: Coping: Goal: Ability to adjust to condition or change in health will improve Outcome: Progressing   Problem: Fluid Volume: Goal: Ability to maintain a balanced intake and output will improve Outcome: Progressing   Problem: Health Behavior/Discharge Planning: Goal: Ability to identify and utilize available resources and services will improve Outcome: Progressing Goal: Ability to manage health-related needs will improve Outcome: Progressing   Problem: Metabolic: Goal: Ability to maintain appropriate glucose levels will improve Outcome: Progressing   Problem: Nutritional: Goal: Maintenance of adequate nutrition will improve Outcome: Progressing Goal: Progress toward achieving an optimal weight will improve Outcome: Progressing   Problem: Skin Integrity: Goal: Risk for impaired skin integrity will decrease Outcome: Progressing    Problem: Tissue Perfusion: Goal: Adequacy of tissue perfusion will improve Outcome: Progressing

## 2024-05-12 NOTE — Progress Notes (Signed)
 PROGRESS NOTE    [name] White  ZOX:[id number] DOB: 1941-11-08 DOA: 05/10/2024 PCP: Omie Bickers, MD   Brief Narrative:    [name] White is a 83 y.o. female with medical history significant of diabetes, hypertension, hypothyroidism, chronic pain.  Patient presents from skilled nursing facility due to increasing shortness of breath that started this morning.  She was admitted here before at the beginning of the month for sepsis from pneumonia that resulted in ICU stay and needing to be on pressors.  She was discharged on antibiotics and steroids for an additional 5 days.  She has now been readmitted with severe sepsis secondary to findings of pneumonia and started on IV antibiotics, but appears to have more of a COPD exacerbation picture.  Assessment & Plan:   Principal Problem:   Severe sepsis (HCC) Active Problems:   CAP (community acquired pneumonia)   CVA (cerebral vascular accident) (HCC)   Hypothyroidism   Diabetes (HCC)   Essential hypertension   Acute on chronic respiratory failure with hypoxia (HCC)  Assessment and Plan:   Acute hypoxemic respiratory failure secondary to COPD exacerbation with possible atypical pneumonia - Continue on azithromycin  and discontinue cefepime  with procalcitonin low - Leukocytosis likely related to steroid use - Continue IV Solu-Medrol  as ordered along with breathing treatments - Mucinex  for congestion - Follow lactic acid - Currently on 2 L nasal cannula and typically on room air, wean as tolerated - Tolerating diet, discontinue further IV fluid  History of CVA -Continue aspirin  and Plavix   Hypomagnesemia - Replete and reevaluate in a.m.  Hypertension -Hold antihypertensives due to soft blood pressure readings  Type 2 diabetes -Continue SSI and Lantus  -CBG monitoring  Hypothyroidism -Levothyroxine     DVT prophylaxis:Lovenox  Code Status: DNR Family Communication: Son at bedside 5/18 Disposition Plan:  Status is:  Inpatient Remains inpatient appropriate because: Need for IV medications.   Consultants:  None  Procedures:  None  Antimicrobials:  Anti-infectives (From admission, onward)    Start     Dose/Rate Route Frequency Ordered Stop   05/11/24 0300  ceFEPIme  (MAXIPIME ) 2 g in sodium chloride  0.9 % 100 mL IVPB  Status:  Discontinued        2 g 200 mL/hr over 30 Minutes Intravenous Every 12 hours 05/10/24 1859 05/11/24 1154   05/10/24 1945  azithromycin  (ZITHROMAX ) 500 mg in sodium chloride  0.9 % 250 mL IVPB        500 mg 250 mL/hr over 60 Minutes Intravenous Every 24 hours 05/10/24 1859 05/15/24 1944   05/10/24 1445  vancomycin  (VANCOCIN ) IVPB 1000 mg/200 mL premix        1,000 mg 200 mL/hr over 60 Minutes Intravenous  Once 05/10/24 1432 05/10/24 1608   05/10/24 1445  ceFEPIme  (MAXIPIME ) 2 g in sodium chloride  0.9 % 100 mL IVPB        2 g 200 mL/hr over 30 Minutes Intravenous  Once 05/10/24 1432 05/10/24 1608      Subjective: Patient seen and evaluated today with improvement in chest congestion noted.  Continues to have oxygen requirement however.  Objective: Vitals:   05/11/24 2253 05/12/24 0321 05/12/24 0611 05/12/24 0624  BP: (!) 89/56 (!) 94/57 (!) 84/54 (!) 96/52  Pulse: 80 82 79   Resp: 16 18 18    Temp: 97.9 F (36.6 C) (!) 97.5 F (36.4 C) 97.6 F (36.4 C)   TempSrc: Oral Oral Oral   SpO2: 98% 100% 99%   Weight:      Height:  Intake/Output Summary (Last 24 hours) at 05/12/2024 1217 Last data filed at 05/12/2024 0900 Gross per 24 hour  Intake 970 ml  Output 1000 ml  Net -30 ml   Filed Weights   05/10/24 1432 05/10/24 1847  Weight: 58 kg 58.9 kg    Examination:  General exam: Appears calm and comfortable  Respiratory system: Bilateral congestion. Respiratory effort normal.  2 L nasal cannula Cardiovascular system: S1 & S2 heard, RRR.  Gastrointestinal system: Abdomen is soft Central nervous system: Alert and awake Extremities: No edema Skin: No  significant lesions noted Psychiatry: Flat affect.    Data Reviewed: I have personally reviewed following labs and imaging studies  CBC: Recent Labs  Lab 05/10/24 1500 05/11/24 0445 05/12/24 0519  WBC 17.2* 14.7* 20.2*  NEUTROABS 12.5*  --   --   HGB 12.8 11.7* 10.6*  HCT 37.4 34.5* 31.8*  MCV 88.4 90.3 91.1  PLT 263 238 237   Basic Metabolic Panel: Recent Labs  Lab 05/10/24 1500 05/11/24 0445 05/12/24 0519  NA 131* 138 137  K 3.7 3.9 3.6  CL 94* 106 108  CO2 23 25 25   GLUCOSE 222* 124* 163*  BUN 15 12 16   CREATININE 0.74 0.55 0.63  CALCIUM  8.4* 8.0* 8.0*  MG  --   --  1.6*   GFR: Estimated Creatinine Clearance: 48.8 mL/min (by C-G formula based on SCr of 0.63 mg/dL). Liver Function Tests: Recent Labs  Lab 05/10/24 1500  AST 20  ALT 32  ALKPHOS 47  BILITOT 0.9  PROT 6.2*  ALBUMIN 3.0*   No results for input(s): "LIPASE", "AMYLASE" in the last 168 hours. No results for input(s): "AMMONIA" in the last 168 hours. Coagulation Profile: Recent Labs  Lab 05/10/24 1500  INR 1.1   Cardiac Enzymes: No results for input(s): "CKTOTAL", "CKMB", "CKMBINDEX", "TROPONINI" in the last 168 hours. BNP (last 3 results) No results for input(s): "PROBNP" in the last 8760 hours. HbA1C: No results for input(s): "HGBA1C" in the last 72 hours. CBG: Recent Labs  Lab 05/11/24 1145 05/11/24 1602 05/11/24 2149 05/12/24 0726 05/12/24 1138  GLUCAP 205* 237* 237* 160* 334*   Lipid Profile: No results for input(s): "CHOL", "HDL", "LDLCALC", "TRIG", "CHOLHDL", "LDLDIRECT" in the last 72 hours. Thyroid  Function Tests: No results for input(s): "TSH", "T4TOTAL", "FREET4", "T3FREE", "THYROIDAB" in the last 72 hours. Anemia Panel: No results for input(s): "VITAMINB12", "FOLATE", "FERRITIN", "TIBC", "IRON", "RETICCTPCT" in the last 72 hours. Sepsis Labs: Recent Labs  Lab 05/10/24 1500 05/10/24 1649 05/12/24 0519  PROCALCITON  --  <0.10  --   LATICACIDVEN 3.4* 2.5* 1.3     Recent Results (from the past 240 hours)  Urine Culture     Status: None (Preliminary result)   Collection Time: 05/10/24  2:33 PM   Specimen: Urine, Random  Result Value Ref Range Status   Specimen Description   Final    URINE, RANDOM Performed at [facility], [address] [place], [place] [address]    Special Requests   Final    NONE Reflexed from [id number] Performed at [facility], [address] [place], [place] [address]    Culture   Final    CULTURE REINCUBATED FOR BETTER GROWTH Performed at [facility], [address] [place], [place] [address]    Report Status PENDING  Incomplete  Resp panel by RT-PCR (RSV, Flu A&B, Covid) Anterior Nasal Swab     Status: None   Collection Time: 05/10/24  2:53 PM  Specimen: Anterior Nasal Swab  Result Value Ref Range Status   SARS Coronavirus 2 by RT PCR NEGATIVE NEGATIVE Final    Comment: (NOTE) SARS-CoV-2 target nucleic acids are NOT DETECTED.  The SARS-CoV-2 RNA is generally detectable in upper respiratory specimens during the acute phase of infection. The lowest concentration of SARS-CoV-2 viral copies this assay can detect is 138 copies/mL. A negative result does not preclude SARS-Cov-2 infection and should not be used as the sole basis for treatment or other patient management decisions. A negative result may occur with  improper specimen collection/handling, submission of specimen other than nasopharyngeal swab, presence of viral mutation(s) within the areas targeted by this assay, and inadequate number of viral copies(<138 copies/mL). A negative result must be combined with clinical observations, patient history, and epidemiological information. The expected result is Negative.  Fact Sheet for Patients:  BloggerCourse.com  Fact Sheet for Healthcare Providers:  SeriousBroker.it  This test is no t yet approved or cleared by the [place]  FDA and  has been  authorized for detection and/or diagnosis of SARS-CoV-2 by FDA under an Emergency Use Authorization (EUA). This EUA will remain  in effect (meaning this test can be used) for the duration of the COVID-19 declaration under Section 564(b)(1) of the Act, 21 U.S.C.section 360bbb-3(b)(1), unless the authorization is terminated  or revoked sooner.       Influenza A by PCR NEGATIVE NEGATIVE Final   Influenza B by PCR NEGATIVE NEGATIVE Final    Comment: (NOTE) The Xpert Xpress SARS-CoV-2/FLU/RSV plus assay is intended as an aid in the diagnosis of influenza from Nasopharyngeal swab specimens and should not be used as a sole basis for treatment. Nasal washings and aspirates are unacceptable for Xpert Xpress SARS-CoV-2/FLU/RSV testing.  Fact Sheet for Patients: BloggerCourse.com  Fact Sheet for Healthcare Providers: SeriousBroker.it  This test is not yet approved or cleared by the [place]  FDA and has been authorized for detection and/or diagnosis of SARS-CoV-2 by FDA under an Emergency Use Authorization (EUA). This EUA will remain in effect (meaning this test can be used) for the duration of the COVID-19 declaration under Section 564(b)(1) of the Act, 21 U.S.C. section 360bbb-3(b)(1), unless the authorization is terminated or revoked.     Resp Syncytial Virus by PCR NEGATIVE NEGATIVE Final    Comment: (NOTE) Fact Sheet for Patients: BloggerCourse.com  Fact Sheet for Healthcare Providers: SeriousBroker.it  This test is not yet approved or cleared by the [place]  FDA and has been authorized for detection and/or diagnosis of SARS-CoV-2 by FDA under an Emergency Use Authorization (EUA). This EUA will remain in effect (meaning this test can be used) for the duration of the COVID-19 declaration under Section 564(b)(1) of the Act, 21 U.S.C. section 360bbb-3(b)(1), unless the  authorization is terminated or revoked.  Performed at [facility], [address], [place], [place] [address]   Blood Culture (routine x 2)     Status: None (Preliminary result)   Collection Time: 05/10/24  3:00 PM   Specimen: BLOOD  Result Value Ref Range Status   Specimen Description BLOOD LEFT ANTECUBITAL  Final   Special Requests   Final    BOTTLES DRAWN AEROBIC AND ANAEROBIC Blood Culture adequate volume   Culture   Final    NO GROWTH 2 DAYS Performed at [facility], [address], [place], [place] [address]    Report Status PENDING  Incomplete  Blood Culture (routine x 2)     Status: None (Preliminary result)   Collection  Time: 05/10/24  3:00 PM   Specimen: BLOOD  Result Value Ref Range Status   Specimen Description BLOOD BLOOD LEFT HAND  Final   Special Requests   Final    AEROBIC BOTTLE ONLY Blood Culture results may not be optimal due to an inadequate volume of blood received in culture bottles   Culture   Final    NO GROWTH 2 DAYS Performed at [facility], [address], [place], [place] [address]    Report Status PENDING  Incomplete  MRSA Next Gen by PCR, Nasal     Status: None   Collection Time: 05/11/24  8:30 AM   Specimen: Nasal Mucosa; Nasal Swab  Result Value Ref Range Status   MRSA by PCR Next Gen NOT DETECTED NOT DETECTED Final    Comment: (NOTE) The GeneXpert MRSA Assay (FDA approved for NASAL specimens only), is one component of a comprehensive MRSA colonization surveillance program. It is not intended to diagnose MRSA infection nor to guide or monitor treatment for MRSA infections. Test performance is not FDA approved in patients less than 75 years old. Performed at [facility], [address], [place], [place] [address]          Radiology Studies: [facility] 1 View Result Date: 05/10/2024 CLINICAL DATA:  Questionable sepsis EXAM: PORTABLE CHEST 1 VIEW COMPARISON:  Chest x-ray 04/26/2024.  Chest CT 04/24/2004. FINDINGS: The heart size and  mediastinal contours are within normal limits. Both lungs are clear. The visualized skeletal structures are unremarkable. IMPRESSION: No active disease. Electronically Signed   By: Tyron Gallon M.D.   On: 05/10/2024 15:17        Scheduled Meds:  aspirin  EC  81 mg Oral Daily   atorvastatin   40 mg Oral Daily   baclofen   10 mg Oral QHS   clopidogrel   75 mg Oral Daily   cycloSPORINE   1 drop Both Eyes BID   dextromethorphan -guaiFENesin   1 tablet Oral BID   enoxaparin  (LOVENOX ) injection  40 mg Subcutaneous Q24H   fluticasone  furoate-vilanterol  1 puff Inhalation Daily   insulin  aspart  0-20 Units Subcutaneous TID WC   insulin  aspart  0-5 Units Subcutaneous QHS   insulin  glargine-yfgn  20 Units Subcutaneous QHS   levothyroxine   50 mcg Oral Q0600   methylPREDNISolone  (SOLU-MEDROL ) injection  60 mg Intravenous Q12H   mirabegron  ER  25 mg Oral Daily   pantoprazole   40 mg Oral Daily   pregabalin   150 mg Oral BID   senna  2 tablet Oral QHS   venlafaxine  XR  150 mg Oral BH-[id number]   vortioxetine  HBr  10 mg Oral Daily   Continuous Infusions:  azithromycin  Stopped (05/11/24 2109)     LOS: 2 days    Time spent: 55 minutes    Braelen Sproule D Mason Sole, DO [facility]  If 7PM-7AM, please contact night-coverage [email or web address] 05/12/2024, 12:17 PM

## 2024-05-13 DIAGNOSIS — R6520 Severe sepsis without septic shock: Secondary | ICD-10-CM | POA: Diagnosis not present

## 2024-05-13 DIAGNOSIS — A419 Sepsis, unspecified organism: Secondary | ICD-10-CM | POA: Diagnosis not present

## 2024-05-13 LAB — BASIC METABOLIC PANEL WITH GFR
Anion gap: 2 — ABNORMAL LOW (ref 5–15)
BUN: 13 mg/dL (ref 8–23)
CO2: 26 mmol/L (ref 22–32)
Calcium: 7.8 mg/dL — ABNORMAL LOW (ref 8.9–10.3)
Chloride: 109 mmol/L (ref 98–111)
Creatinine, Ser: 0.53 mg/dL (ref 0.44–1.00)
GFR, Estimated: >60 mL/min (ref >60)
Glucose, Bld: 91 mg/dL (ref 70–99)
Potassium: 3.8 mmol/L (ref 3.5–5.1)
Sodium: 137 mmol/L (ref 135–145)

## 2024-05-13 LAB — CBC
HCT: 31.8 % — ABNORMAL LOW (ref 36.0–46.0)
Hemoglobin: 10.4 g/dL — ABNORMAL LOW (ref 12.0–15.0)
MCH: 29.9 pg (ref 26.0–34.0)
MCHC: 32.7 g/dL (ref 30.0–36.0)
MCV: 91.4 fL (ref 80.0–100.0)
Platelets: 210 10*3/uL (ref 150–400)
RBC: 3.48 MIL/uL — ABNORMAL LOW (ref 3.87–5.11)
RDW: 17.1 % — ABNORMAL HIGH (ref 11.5–15.5)
WBC: 16.4 10*3/uL — ABNORMAL HIGH (ref 4.0–10.5)
nRBC: 0 % (ref 0.0–0.2)

## 2024-05-13 LAB — URINE CULTURE: Culture: 80000 — AB

## 2024-05-13 LAB — GLUCOSE, CAPILLARY
Glucose-Capillary: 76 mg/dL (ref 70–99)
Glucose-Capillary: 239 mg/dL — ABNORMAL HIGH (ref 70–99)
Glucose-Capillary: 203 mg/dL — ABNORMAL HIGH (ref 70–99)
Glucose-Capillary: 370 mg/dL — ABNORMAL HIGH (ref 70–99)
Glucose-Capillary: 435 mg/dL — ABNORMAL HIGH (ref 70–99)

## 2024-05-13 LAB — MAGNESIUM: Magnesium: 1.8 mg/dL (ref 1.7–2.4)

## 2024-05-13 MED ORDER — INSULIN ASPART 100 UNIT/ML IJ SOLN
3.0000 [IU] | Freq: Three times a day (TID) | INTRAMUSCULAR | Status: DC
  Administered 2024-05-13 – 2024-05-14 (×4): 3 [IU] via SUBCUTANEOUS

## 2024-05-13 MED ORDER — INSULIN GLARGINE-YFGN 100 UNIT/ML ~~LOC~~ SOLN
10.0000 [IU] | Freq: Every day | SUBCUTANEOUS | Status: DC
  Administered 2024-05-13 – 2024-05-14 (×2): 10 [IU] via SUBCUTANEOUS
  Filled 2024-05-13 (×3): qty 0.1

## 2024-05-13 MED ORDER — METHYLPREDNISOLONE SODIUM SUCC 40 MG IJ SOLR
40.0000 mg | Freq: Two times a day (BID) | INTRAMUSCULAR | Status: DC
  Administered 2024-05-13 – 2024-05-14 (×2): 40 mg via INTRAVENOUS
  Filled 2024-05-13 (×2): qty 1

## 2024-05-13 NOTE — Care Management Important Message (Signed)
 Important Message  Patient Details  Name: [name] White MRN: [id number] Date of Birth: [birth year]   Important Message Given:  N/A - LOS <3 / Initial given by admissions     Neila Bally 05/13/2024, 12:18 PM

## 2024-05-13 NOTE — Progress Notes (Signed)
 PROGRESS NOTE    [name] White  JYN:[id number] DOB: 1941/04/05 DOA: 05/10/2024 PCP: Omie Bickers, MD   Brief Narrative:    [name] White is a 83 y.o. female with medical history significant of diabetes, hypertension, hypothyroidism, chronic pain.  Patient presents from skilled nursing facility due to increasing shortness of breath that started this morning.  She was admitted here before at the beginning of the month for sepsis from pneumonia that resulted in ICU stay and needing to be on pressors.  She was discharged on antibiotics and steroids for an additional 5 days.  She has now been readmitted with severe sepsis secondary to findings of pneumonia and started on IV antibiotics, but appears to have more of a COPD exacerbation picture.  Working on oxygen weaning today as well as weaning of steroid dose, anticipate discharge in a.m if improved back to baseline.  Assessment & Plan:   Principal Problem:   Severe sepsis (HCC) Active Problems:   CAP (community acquired pneumonia)   CVA (cerebral vascular accident) (HCC)   Hypothyroidism   Diabetes (HCC)   Essential hypertension   Acute on chronic respiratory failure with hypoxia (HCC)  Assessment and Plan:   Acute hypoxemic respiratory failure secondary to COPD exacerbation with possible atypical pneumonia-improving - Continue on azithromycin  and discontinue cefepime  with procalcitonin low - Leukocytosis likely related to steroid use - Continue IV Solu-Medrol  as ordered along with breathing treatments, wean Solu-Medrol  to 40 twice daily starting this evening - Mucinex  for congestion - Lactic acidosis resolved - Weaned to room air today - Tolerating diet, discontinue further IV fluid  History of CVA -Continue aspirin  and Plavix   Hypertension -Hold antihypertensives due to soft blood pressure readings, continue to follow  Type 2 diabetes with some hypoglycemia -Continue SSI and Semglee  at lower dose of 10 units at  bedtime -NovoLog  3 units 3 times daily added -CBG monitoring  Hypothyroidism -Levothyroxine     DVT prophylaxis:Lovenox  Code Status: DNR Family Communication: Son at bedside 5/18 Disposition Plan:  Status is: Inpatient Remains inpatient appropriate because: Need for IV medications.   Consultants:  None  Procedures:  None  Antimicrobials:  Anti-infectives (From admission, onward)    Start     Dose/Rate Route Frequency Ordered Stop   05/11/24 0300  ceFEPIme  (MAXIPIME ) 2 g in sodium chloride  0.9 % 100 mL IVPB  Status:  Discontinued        2 g 200 mL/hr over 30 Minutes Intravenous Every 12 hours 05/10/24 1859 05/11/24 1154   05/10/24 1945  azithromycin  (ZITHROMAX ) 500 mg in sodium chloride  0.9 % 250 mL IVPB        500 mg 250 mL/hr over 60 Minutes Intravenous Every 24 hours 05/10/24 1859 05/15/24 1944   05/10/24 1445  vancomycin  (VANCOCIN ) IVPB 1000 mg/200 mL premix        1,000 mg 200 mL/hr over 60 Minutes Intravenous  Once 05/10/24 1432 05/10/24 1608   05/10/24 1445  ceFEPIme  (MAXIPIME ) 2 g in sodium chloride  0.9 % 100 mL IVPB        2 g 200 mL/hr over 30 Minutes Intravenous  Once 05/10/24 1432 05/10/24 1608      Subjective: Patient seen and evaluated today with overall improvement in breathing noted with no acute overnight events noted.  Still on 2 L nasal cannula oxygen and is weaning.  Objective: Vitals:   05/13/24 0408 05/13/24 0558 05/13/24 0746 05/13/24 0900  BP: 93/63 92/62    Pulse: 81 85    Resp:  16 16    Temp: 97.6 F (36.4 C) 98 F (36.7 C)    TempSrc: Oral Oral    SpO2: 99% 98% 97% 96%  Weight:      Height:        Intake/Output Summary (Last 24 hours) at 05/13/2024 0919 Last data filed at 05/13/2024 0449 Gross per 24 hour  Intake 360 ml  Output 1500 ml  Net -1140 ml   Filed Weights   05/10/24 1432 05/10/24 1847  Weight: 58 kg 58.9 kg    Examination:  General exam: Appears calm and comfortable  Respiratory system: Clear to auscultation  bilaterally. Respiratory effort normal.  2 L nasal cannula Cardiovascular system: S1 & S2 heard, RRR.  Gastrointestinal system: Abdomen is soft Central nervous system: Alert and awake Extremities: No edema Skin: No significant lesions noted Psychiatry: Flat affect.    Data Reviewed: I have personally reviewed following labs and imaging studies  CBC: Recent Labs  Lab 05/10/24 1500 05/11/24 0445 05/12/24 0519 05/13/24 0511  WBC 17.2* 14.7* 20.2* 16.4*  NEUTROABS 12.5*  --   --   --   HGB 12.8 11.7* 10.6* 10.4*  HCT 37.4 34.5* 31.8* 31.8*  MCV 88.4 90.3 91.1 91.4  PLT 263 238 237 210   Basic Metabolic Panel: Recent Labs  Lab 05/10/24 1500 05/11/24 0445 05/12/24 0519 05/13/24 0511  NA 131* 138 137 137  K 3.7 3.9 3.6 3.8  CL 94* 106 108 109  CO2 23 25 25 26   GLUCOSE 222* 124* 163* 91  BUN 15 12 16 13   CREATININE 0.74 0.55 0.63 0.53  CALCIUM  8.4* 8.0* 8.0* 7.8*  MG  --   --  1.6* 1.8   GFR: Estimated Creatinine Clearance: 48.8 mL/min (by C-G formula based on SCr of 0.53 mg/dL). Liver Function Tests: Recent Labs  Lab 05/10/24 1500  AST 20  ALT 32  ALKPHOS 47  BILITOT 0.9  PROT 6.2*  ALBUMIN 3.0*   No results for input(s): "LIPASE", "AMYLASE" in the last 168 hours. No results for input(s): "AMMONIA" in the last 168 hours. Coagulation Profile: Recent Labs  Lab 05/10/24 1500  INR 1.1   Cardiac Enzymes: No results for input(s): "CKTOTAL", "CKMB", "CKMBINDEX", "TROPONINI" in the last 168 hours. BNP (last 3 results) No results for input(s): "PROBNP" in the last 8760 hours. HbA1C: No results for input(s): "HGBA1C" in the last 72 hours. CBG: Recent Labs  Lab 05/12/24 0726 05/12/24 1138 05/12/24 1658 05/12/24 2117 05/13/24 0738  GLUCAP 160* 334* 223* 234* 76   Lipid Profile: No results for input(s): "CHOL", "HDL", "LDLCALC", "TRIG", "CHOLHDL", "LDLDIRECT" in the last 72 hours. Thyroid  Function Tests: No results for input(s): "TSH", "T4TOTAL",  "FREET4", "T3FREE", "THYROIDAB" in the last 72 hours. Anemia Panel: No results for input(s): "VITAMINB12", "FOLATE", "FERRITIN", "TIBC", "IRON", "RETICCTPCT" in the last 72 hours. Sepsis Labs: Recent Labs  Lab 05/10/24 1500 05/10/24 1649 05/12/24 0519  PROCALCITON  --  <0.10  --   LATICACIDVEN 3.4* 2.5* 1.3    Recent Results (from the past 240 hours)  Urine Culture     Status: Abnormal   Collection Time: 05/10/24  2:33 PM   Specimen: Urine, Random  Result Value Ref Range Status   Specimen Description   Final    URINE, RANDOM Performed at [facility], [address] [place], [place] [address]    Special Requests   Final    NONE Reflexed from [id number] Performed at [facility], [address] [place], [place] [address]  Culture 80,000 COLONIES/mL ENTEROCOCCUS FAECALIS (A)  Final   Report Status 05/13/2024 FINAL  Final   Organism ID, Bacteria ENTEROCOCCUS FAECALIS (A)  Final      Susceptibility   Enterococcus faecalis - MIC*    AMPICILLIN <=2 SENSITIVE Sensitive     NITROFURANTOIN <=16 SENSITIVE Sensitive     VANCOMYCIN  >=32 RESISTANT Resistant     * 80,000 COLONIES/mL ENTEROCOCCUS FAECALIS  Resp panel by RT-PCR (RSV, Flu A&B, Covid) Anterior Nasal Swab     Status: None   Collection Time: 05/10/24  2:53 PM   Specimen: Anterior Nasal Swab  Result Value Ref Range Status   SARS Coronavirus 2 by RT PCR NEGATIVE NEGATIVE Final    Comment: (NOTE) SARS-CoV-2 target nucleic acids are NOT DETECTED.  The SARS-CoV-2 RNA is generally detectable in upper respiratory specimens during the acute phase of infection. The lowest concentration of SARS-CoV-2 viral copies this assay can detect is 138 copies/mL. A negative result does not preclude SARS-Cov-2 infection and should not be used as the sole basis for treatment or other patient management decisions. A negative result may occur with  improper specimen collection/handling, submission of specimen other than nasopharyngeal swab,  presence of viral mutation(s) within the areas targeted by this assay, and inadequate number of viral copies(<138 copies/mL). A negative result must be combined with clinical observations, patient history, and epidemiological information. The expected result is Negative.  Fact Sheet for Patients:  BloggerCourse.com  Fact Sheet for Healthcare Providers:  SeriousBroker.it  This test is no t yet approved or cleared by the [place]  FDA and  has been authorized for detection and/or diagnosis of SARS-CoV-2 by FDA under an Emergency Use Authorization (EUA). This EUA will remain  in effect (meaning this test can be used) for the duration of the COVID-19 declaration under Section 564(b)(1) of the Act, 21 U.S.C.section 360bbb-3(b)(1), unless the authorization is terminated  or revoked sooner.       Influenza A by PCR NEGATIVE NEGATIVE Final   Influenza B by PCR NEGATIVE NEGATIVE Final    Comment: (NOTE) The Xpert Xpress SARS-CoV-2/FLU/RSV plus assay is intended as an aid in the diagnosis of influenza from Nasopharyngeal swab specimens and should not be used as a sole basis for treatment. Nasal washings and aspirates are unacceptable for Xpert Xpress SARS-CoV-2/FLU/RSV testing.  Fact Sheet for Patients: BloggerCourse.com  Fact Sheet for Healthcare Providers: SeriousBroker.it  This test is not yet approved or cleared by the [place]  FDA and has been authorized for detection and/or diagnosis of SARS-CoV-2 by FDA under an Emergency Use Authorization (EUA). This EUA will remain in effect (meaning this test can be used) for the duration of the COVID-19 declaration under Section 564(b)(1) of the Act, 21 U.S.C. section 360bbb-3(b)(1), unless the authorization is terminated or revoked.     Resp Syncytial Virus by PCR NEGATIVE NEGATIVE Final    Comment: (NOTE) Fact Sheet for  Patients: BloggerCourse.com  Fact Sheet for Healthcare Providers: SeriousBroker.it  This test is not yet approved or cleared by the [place]  FDA and has been authorized for detection and/or diagnosis of SARS-CoV-2 by FDA under an Emergency Use Authorization (EUA). This EUA will remain in effect (meaning this test can be used) for the duration of the COVID-19 declaration under Section 564(b)(1) of the Act, 21 U.S.C. section 360bbb-3(b)(1), unless the authorization is terminated or revoked.  Performed at [facility], [address], [place], [place] [address]   Blood Culture (routine x 2)     Status:  None (Preliminary result)   Collection Time: 05/10/24  3:00 PM   Specimen: BLOOD  Result Value Ref Range Status   Specimen Description BLOOD LEFT ANTECUBITAL  Final   Special Requests   Final    BOTTLES DRAWN AEROBIC AND ANAEROBIC Blood Culture adequate volume   Culture   Final    NO GROWTH 3 DAYS Performed at [facility], [address], [place], [place] [address]    Report Status PENDING  Incomplete  Blood Culture (routine x 2)     Status: None (Preliminary result)   Collection Time: 05/10/24  3:00 PM   Specimen: BLOOD  Result Value Ref Range Status   Specimen Description BLOOD BLOOD LEFT HAND  Final   Special Requests   Final    AEROBIC BOTTLE ONLY Blood Culture results may not be optimal due to an inadequate volume of blood received in culture bottles   Culture   Final    NO GROWTH 3 DAYS Performed at [facility], [address], [place], [place] [address]    Report Status PENDING  Incomplete  MRSA Next Gen by PCR, Nasal     Status: None   Collection Time: 05/11/24  8:30 AM   Specimen: Nasal Mucosa; Nasal Swab  Result Value Ref Range Status   MRSA by PCR Next Gen NOT DETECTED NOT DETECTED Final    Comment: (NOTE) The GeneXpert MRSA Assay (FDA approved for NASAL specimens only), is one component of a comprehensive MRSA  colonization surveillance program. It is not intended to diagnose MRSA infection nor to guide or monitor treatment for MRSA infections. Test performance is not FDA approved in patients less than 75 years old. Performed at [facility], [address] [place], [place] [address]          Radiology Studies: No results found.       Scheduled Meds:  aspirin  EC  81 mg Oral Daily   atorvastatin   40 mg Oral Daily   baclofen   10 mg Oral QHS   clopidogrel   75 mg Oral Daily   cycloSPORINE   1 drop Both Eyes BID   dextromethorphan -guaiFENesin   1 tablet Oral BID   enoxaparin  (LOVENOX ) injection  40 mg Subcutaneous Q24H   fluticasone  furoate-vilanterol  1 puff Inhalation Daily   insulin  aspart  0-20 Units Subcutaneous TID WC   insulin  aspart  0-5 Units Subcutaneous QHS   insulin  aspart  3 Units Subcutaneous TID WC   insulin  glargine-yfgn  10 Units Subcutaneous QHS   levothyroxine   50 mcg Oral Q0600   methylPREDNISolone  (SOLU-MEDROL ) injection  40 mg Intravenous Q12H   mirabegron  ER  25 mg Oral Daily   pantoprazole   40 mg Oral Daily   pregabalin   150 mg Oral BID   senna  2 tablet Oral QHS   venlafaxine  XR  150 mg Oral BH-q7a   vortioxetine  HBr  10 mg Oral Daily   Continuous Infusions:  azithromycin  500 mg (05/12/24 2051)     LOS: 3 days    Time spent: 55 minutes    Shaunie Boehm D Mason Sole, DO [facility]  If 7PM-7AM, please contact night-coverage [email or web address] 05/13/2024, 9:19 AM

## 2024-05-13 NOTE — Plan of Care (Signed)
  Problem: Education: Goal: Knowledge of General Education information will improve Description: Including pain rating scale, medication(s)/side effects and non-pharmacologic comfort measures Outcome: Progressing   Problem: Health Behavior/Discharge Planning: Goal: Ability to manage health-related needs will improve Outcome: Progressing   Problem: Clinical Measurements: Goal: Ability to maintain clinical measurements within normal limits will improve Outcome: Progressing Goal: Will remain free from infection Outcome: Progressing Goal: Diagnostic test results will improve Outcome: Progressing Goal: Respiratory complications will improve Outcome: Progressing Goal: Cardiovascular complication will be avoided Outcome: Progressing   Problem: Activity: Goal: Risk for activity intolerance will decrease Outcome: Progressing   Problem: Nutrition: Goal: Adequate nutrition will be maintained Outcome: Progressing   Problem: Coping: Goal: Level of anxiety will decrease Outcome: Progressing   Problem: Elimination: Goal: Will not experience complications related to bowel motility Outcome: Progressing Goal: Will not experience complications related to urinary retention Outcome: Progressing   Problem: Pain Managment: Goal: General experience of comfort will improve and/or be controlled Outcome: Progressing   Problem: Safety: Goal: Ability to remain free from injury will improve Outcome: Progressing   Problem: Skin Integrity: Goal: Risk for impaired skin integrity will decrease Outcome: Progressing   Problem: Education: Goal: Knowledge of disease or condition will improve Outcome: Progressing Goal: Knowledge of the prescribed therapeutic regimen will improve Outcome: Progressing Goal: Individualized Educational Video(s) Outcome: Progressing   Problem: Activity: Goal: Ability to tolerate increased activity will improve Outcome: Progressing Goal: Will verbalize the  importance of balancing activity with adequate rest periods Outcome: Progressing   Problem: Respiratory: Goal: Ability to maintain a clear airway will improve Outcome: Progressing Goal: Levels of oxygenation will improve Outcome: Progressing Goal: Ability to maintain adequate ventilation will improve Outcome: Progressing   Problem: Activity: Goal: Ability to tolerate increased activity will improve Outcome: Progressing   Problem: Clinical Measurements: Goal: Ability to maintain a body temperature in the normal range will improve Outcome: Progressing   Problem: Respiratory: Goal: Ability to maintain adequate ventilation will improve Outcome: Progressing Goal: Ability to maintain a clear airway will improve Outcome: Progressing   Problem: Education: Goal: Ability to describe self-care measures that may prevent or decrease complications (Diabetes Survival Skills Education) will improve Outcome: Progressing Goal: Individualized Educational Video(s) Outcome: Progressing   Problem: Coping: Goal: Ability to adjust to condition or change in health will improve Outcome: Progressing   Problem: Fluid Volume: Goal: Ability to maintain a balanced intake and output will improve Outcome: Progressing   Problem: Health Behavior/Discharge Planning: Goal: Ability to identify and utilize available resources and services will improve Outcome: Progressing Goal: Ability to manage health-related needs will improve Outcome: Progressing   Problem: Metabolic: Goal: Ability to maintain appropriate glucose levels will improve Outcome: Progressing   Problem: Nutritional: Goal: Maintenance of adequate nutrition will improve Outcome: Progressing Goal: Progress toward achieving an optimal weight will improve Outcome: Progressing   Problem: Skin Integrity: Goal: Risk for impaired skin integrity will decrease Outcome: Progressing   Problem: Tissue Perfusion: Goal: Adequacy of tissue  perfusion will improve Outcome: Progressing

## 2024-05-13 NOTE — Progress Notes (Signed)
 Mobility Specialist Progress Note:    05/13/24 1222  Mobility  Activity Stood at bedside;Transferred from bed to chair  Level of Assistance Moderate assist, patient does 50-74%  Assistive Device None  Distance Ambulated (ft) 2 ft  Range of Motion/Exercises Active;All extremities  Activity Response Tolerated well  Mobility Referral Yes  Mobility visit 1 Mobility  Mobility Specialist Start Time (ACUTE ONLY) 1208  Mobility Specialist Stop Time (ACUTE ONLY) 1222  Mobility Specialist Time Calculation (min) (ACUTE ONLY) 14 min   Pt received in bed, agreeable to mobility. Required ModA to stand and transfer with no AD. Tolerated well, asx throughout. Alarm on, call bell in hand. All needs met.  Glinda Lapping Mobility Specialist Please contact via Special educational needs teacher or  Rehab office at [phone]

## 2024-05-13 NOTE — Inpatient Diabetes Management (Signed)
 Inpatient Diabetes Program Recommendations  AACE/ADA: New Consensus Statement on Inpatient Glycemic Control  Target Ranges:  Prepandial:   less than 140 mg/dL      Peak postprandial:   less than 180 mg/dL (1-2 hours)      Critically ill patients:  140 - 180 mg/dL    Latest Reference Range & Units 05/12/24 07:26 05/12/24 11:38 05/12/24 16:58 05/12/24 21:17 05/13/24 07:38  Glucose-Capillary 70 - 99 mg/dL 161 (H) 096 (H) 045 (H) 234 (H) 76   Review of Glycemic Control  Diabetes history: DM2 Outpatient Diabetes medications: Metformin  500 mg BID Current orders for Inpatient glycemic control: Semglee  20 units at bedtime, Novolog  0-20 units TID with meals, Novolog  0-5 units at bedtime; Solumedrol 40 mg Q12H  Inpatient Diabetes Program Recommendations:    Insulin : Post prandial glucose up to 334 mg/dl on 4/09 and CBG 76 mg/dl today. Noted steroids decreased. Please consider decreasing Semglee  to 10 units at bedtime and adding Novolog  3 units TID with meals for meal coverage if patient eats at least 50% of meals.  Thanks, Beacher Limerick, RN, MSN, CDCES Diabetes Coordinator Inpatient Diabetes Program [phone] (Team Pager from 8am to 5pm)

## 2024-05-13 NOTE — TOC Progression Note (Signed)
 Transition of Care [facility]) - Progression Note    Patient Details  Name: [name] White MRN: [id number] Date of Birth: 02/26/1941  [facility] CM/SW Contact  Grandville Lax, [place] Phone Number: 05/13/2024, 11:45 AM  Clinical Narrative:    CSW spoke to [place] with [facility] to provide update. Pt can return to ALF when medically stable. TOC to follow.   Expected Discharge Plan: Long Term Nursing Home    Expected Discharge Plan and Services       Living arrangements for the past 2 months: Skilled Nursing Facility                                       Social Determinants of Health (SDOH) Interventions SDOH Screenings   Food Insecurity: No Food Insecurity (05/10/2024)  Housing: High Risk (05/10/2024)  Transportation Needs: No Transportation Needs (05/10/2024)  Utilities: Not At Risk (05/10/2024)  Depression (PHQ2-9): Low Risk  (01/28/2020)  Social Connections: Socially Isolated (05/10/2024)  Tobacco Use: Low Risk  (05/10/2024)    Readmission Risk Interventions    04/25/2024    1:46 PM 12/27/2023    1:21 PM  Readmission Risk Prevention Plan  Transportation Screening Complete Complete  PCP or Specialist Appt within 3-5 Days Not Complete   Home Care Screening  Complete  Medication Review (RN CM)  Complete  HRI or Home Care Consult Complete   Social Work Consult for Recovery Care Planning/Counseling Complete   Palliative Care Screening Not Complete   Medication Review Oceanographer) Complete

## 2024-05-14 DIAGNOSIS — R6520 Severe sepsis without septic shock: Secondary | ICD-10-CM | POA: Diagnosis not present

## 2024-05-14 DIAGNOSIS — A419 Sepsis, unspecified organism: Secondary | ICD-10-CM | POA: Diagnosis not present

## 2024-05-14 LAB — GLUCOSE, CAPILLARY
Glucose-Capillary: 196 mg/dL — ABNORMAL HIGH (ref 70–99)
Glucose-Capillary: 224 mg/dL — ABNORMAL HIGH (ref 70–99)
Glucose-Capillary: 295 mg/dL — ABNORMAL HIGH (ref 70–99)
Glucose-Capillary: 286 mg/dL — ABNORMAL HIGH (ref 70–99)
Glucose-Capillary: 283 mg/dL — ABNORMAL HIGH (ref 70–99)

## 2024-05-14 LAB — LEGIONELLA PNEUMOPHILA SEROGP 1 UR AG: L. pneumophila Serogp 1 Ur Ag: NEGATIVE

## 2024-05-14 LAB — BASIC METABOLIC PANEL WITH GFR
Anion gap: 2 — ABNORMAL LOW (ref 5–15)
BUN: 17 mg/dL (ref 8–23)
CO2: 26 mmol/L (ref 22–32)
Calcium: 7.8 mg/dL — ABNORMAL LOW (ref 8.9–10.3)
Chloride: 109 mmol/L (ref 98–111)
Creatinine, Ser: 0.6 mg/dL (ref 0.44–1.00)
GFR, Estimated: >60 mL/min (ref >60)
Glucose, Bld: 221 mg/dL — ABNORMAL HIGH (ref 70–99)
Potassium: 3.9 mmol/L (ref 3.5–5.1)
Sodium: 137 mmol/L (ref 135–145)

## 2024-05-14 LAB — CBC
HCT: 31.2 % — ABNORMAL LOW (ref 36.0–46.0)
Hemoglobin: 9.8 g/dL — ABNORMAL LOW (ref 12.0–15.0)
MCH: 28.7 pg (ref 26.0–34.0)
MCHC: 31.4 g/dL (ref 30.0–36.0)
MCV: 91.5 fL (ref 80.0–100.0)
Platelets: 205 10*3/uL (ref 150–400)
RBC: 3.41 MIL/uL — ABNORMAL LOW (ref 3.87–5.11)
RDW: 17 % — ABNORMAL HIGH (ref 11.5–15.5)
WBC: 13.2 10*3/uL — ABNORMAL HIGH (ref 4.0–10.5)
nRBC: 0 % (ref 0.0–0.2)

## 2024-05-14 LAB — MAGNESIUM: Magnesium: 1.8 mg/dL (ref 1.7–2.4)

## 2024-05-14 MED ORDER — PREDNISONE 20 MG PO TABS
40.0000 mg | ORAL_TABLET | Freq: Every day | ORAL | Status: DC
  Administered 2024-05-15: 40 mg via ORAL
  Filled 2024-05-14: qty 2

## 2024-05-14 MED ORDER — INSULIN ASPART 100 UNIT/ML IJ SOLN
5.0000 [IU] | Freq: Three times a day (TID) | INTRAMUSCULAR | Status: DC
  Administered 2024-05-14 – 2024-05-15 (×2): 5 [IU] via SUBCUTANEOUS

## 2024-05-14 MED ORDER — PREDNISONE 20 MG PO TABS: 40.0000 mg | ORAL_TABLET | Freq: Every day | ORAL | 0 refills | Status: DC

## 2024-05-14 MED ORDER — DM-GUAIFENESIN ER 30-600 MG PO TB12: 1.0000 | ORAL_TABLET | Freq: Two times a day (BID) | ORAL | 0 refills | Status: AC

## 2024-05-14 MED ORDER — ALPRAZOLAM 0.25 MG PO TABS
0.2500 mg | ORAL_TABLET | Freq: Three times a day (TID) | ORAL | Status: DC | PRN
  Administered 2024-05-14: 0.25 mg via ORAL
  Filled 2024-05-14: qty 1

## 2024-05-14 MED ORDER — BACLOFEN 10 MG PO TABS: 10.0000 mg | ORAL_TABLET | Freq: Every evening | ORAL | 0 refills | Status: DC

## 2024-05-14 NOTE — TOC Progression Note (Signed)
 Transition of Care [facility]) - Progression Note    Patient Details  Name: [name] White MRN: [id number] Date of Birth: Apr 26, 1941  Transition [facility]) CM/SW Contact  Grandville Lax, [place] Phone Number: 05/14/2024, 12:28 PM  Clinical Narrative:    CSW updated that pt is not medically stable but will likely be ready for D/C back to ALF tomorrow. CSW updated Slyvia with [facility] of plan, she states that will be fine. HH PT/RN orders have been placed by MD and Sarah with [facility] has been updated. TOC to follow.   Expected Discharge Plan: Long Term Nursing Home Barriers to Discharge: Continued Medical Work up  Expected Discharge Plan and Services       Living arrangements for the past 2 months: Skilled Nursing Facility Expected Discharge Date: 05/14/24                                     Social Determinants of Health (SDOH) Interventions SDOH Screenings   Food Insecurity: No Food Insecurity (05/10/2024)  Housing: High Risk (05/10/2024)  Transportation Needs: No Transportation Needs (05/10/2024)  Utilities: Not At Risk (05/10/2024)  Depression (PHQ2-9): Low Risk  (01/28/2020)  Social Connections: Socially Isolated (05/10/2024)  Tobacco Use: Low Risk  (05/10/2024)    Readmission Risk Interventions    04/25/2024    1:46 PM 12/27/2023    1:21 PM  Readmission Risk Prevention Plan  Transportation Screening Complete Complete  PCP or Specialist Appt within 3-5 Days Not Complete   Home Care Screening  Complete  Medication Review (RN CM)  Complete  HRI or Home Care Consult Complete   Social Work Consult for Recovery Care Planning/Counseling Complete   Palliative Care Screening Not Complete   Medication Review Oceanographer) Complete

## 2024-05-14 NOTE — Progress Notes (Signed)
 PROGRESS NOTE    SHERREL PLOCH  ZOX:[id number] DOB: 11/22/1941 DOA: 05/10/2024 PCP: Omie Bickers, MD   Brief Narrative:    [name] White is a 83 y.o. female with medical history significant of diabetes, hypertension, hypothyroidism, chronic pain.  Patient presents from skilled nursing facility due to increasing shortness of breath that started this morning.  She was admitted here before at the beginning of the month for sepsis from pneumonia that resulted in ICU stay and needing to be on pressors.  She was discharged on antibiotics and steroids for an additional 5 days.  She has now been readmitted with severe sepsis secondary to findings of pneumonia and started on IV antibiotics, but appears to have more of a COPD exacerbation picture.  Oxygen has been weaned and she was even able to ambulate without oxygen use, but continues to have subjective dyspnea and insists on 1 more day of hospitalization prior to discharge.  Working on anxiety component with Xanax .  Anticipate discharge in a.m. once further improved.  Assessment & Plan:   Principal Problem:   Severe sepsis (HCC) Active Problems:   CAP (community acquired pneumonia)   CVA (cerebral vascular accident) (HCC)   Hypothyroidism   Diabetes (HCC)   Essential hypertension   Acute on chronic respiratory failure with hypoxia (HCC)  Assessment and Plan:   Acute hypoxemic respiratory failure secondary to COPD exacerbation with possible atypical pneumonia-improving - Continue on azithromycin   - Leukocytosis likely related to steroid use-resolved - IV Solu-Medrol  to oral prednisone  started 5/21 - Mucinex  for congestion - Lactic acidosis resolved - Weaned to room air today and even ambulates without oxygen  History of CVA -Continue aspirin  and Plavix   Hypertension -Hold antihypertensives due to soft blood pressure readings, continue to follow  Type 2 diabetes with labile control -Continue SSI and Semglee  at lower dose of 10 units  at bedtime -NovoLog  5 units 3 times daily added -CBG monitoring  Hypothyroidism -Levothyroxine     DVT prophylaxis:Lovenox  Code Status: DNR Family Communication: Daughter on phone and sister at bedside 5/20 Disposition Plan:  Status is: Inpatient Remains inpatient appropriate because: Need for IV medications.   Consultants:  None  Procedures:  None  Antimicrobials:  Anti-infectives (From admission, onward)    Start     Dose/Rate Route Frequency Ordered Stop   05/11/24 0300  ceFEPIme  (MAXIPIME ) 2 g in sodium chloride  0.9 % 100 mL IVPB  Status:  Discontinued        2 g 200 mL/hr over 30 Minutes Intravenous Every 12 hours 05/10/24 1859 05/11/24 1154   05/10/24 1945  azithromycin  (ZITHROMAX ) 500 mg in sodium chloride  0.9 % 250 mL IVPB        500 mg 250 mL/hr over 60 Minutes Intravenous Every 24 hours 05/10/24 1859 05/15/24 1944   05/10/24 1445  vancomycin  (VANCOCIN ) IVPB 1000 mg/200 mL premix        1,000 mg 200 mL/hr over 60 Minutes Intravenous  Once 05/10/24 1432 05/10/24 1608   05/10/24 1445  ceFEPIme  (MAXIPIME ) 2 g in sodium chloride  0.9 % 100 mL IVPB        2 g 200 mL/hr over 30 Minutes Intravenous  Once 05/10/24 1432 05/10/24 1608      Subjective: Patient seen and evaluated today and continues to complain of some shortness of breath despite now being on room air and able to ambulate without any oxygen requirements.  She was being set up for discharge and complained about still being short of breath  and insists on staying here at least 1 more day.  Given some Xanax  to help with anxiety.  Objective: Vitals:   05/13/24 1520 05/13/24 2220 05/14/24 0424 05/14/24 0719  BP: 90/61 94/60 103/72   Pulse: 92 94 82   Resp: 18 20 19    Temp:  97.7 F (36.5 C) 97.6 F (36.4 C)   TempSrc:  Oral Oral   SpO2: 94% 94% 94% 96%  Weight:      Height:        Intake/Output Summary (Last 24 hours) at 05/14/2024 1216 Last data filed at 05/14/2024 0900 Gross per 24 hour  Intake 740  ml  Output 500 ml  Net 240 ml   Filed Weights   05/10/24 1432 05/10/24 1847  Weight: 58 kg 58.9 kg    Examination:  General exam: Appears calm and comfortable  Respiratory system: Clear to auscultation bilaterally. Respiratory effort normal.  Room air Cardiovascular system: S1 & S2 heard, RRR.  Gastrointestinal system: Abdomen is soft Central nervous system: Alert and awake Extremities: No edema Skin: No significant lesions noted Psychiatry: Flat affect.    Data Reviewed: I have personally reviewed following labs and imaging studies  CBC: Recent Labs  Lab 05/10/24 1500 05/11/24 0445 05/12/24 0519 05/13/24 0511 05/14/24 0533  WBC 17.2* 14.7* 20.2* 16.4* 13.2*  NEUTROABS 12.5*  --   --   --   --   HGB 12.8 11.7* 10.6* 10.4* 9.8*  HCT 37.4 34.5* 31.8* 31.8* 31.2*  MCV 88.4 90.3 91.1 91.4 91.5  PLT 263 238 237 210 205   Basic Metabolic Panel: Recent Labs  Lab 05/10/24 1500 05/11/24 0445 05/12/24 0519 05/13/24 0511 05/14/24 0533  NA 131* 138 137 137 137  K 3.7 3.9 3.6 3.8 3.9  CL 94* 106 108 109 109  CO2 23 25 25 26 26   GLUCOSE 222* 124* 163* 91 221*  BUN 15 12 16 13 17   CREATININE 0.74 0.55 0.63 0.53 0.60  CALCIUM  8.4* 8.0* 8.0* 7.8* 7.8*  MG  --   --  1.6* 1.8 1.8   GFR: Estimated Creatinine Clearance: 48.8 mL/min (by C-G formula based on SCr of 0.6 mg/dL). Liver Function Tests: Recent Labs  Lab 05/10/24 1500  AST 20  ALT 32  ALKPHOS 47  BILITOT 0.9  PROT 6.2*  ALBUMIN 3.0*   No results for input(s): "LIPASE", "AMYLASE" in the last 168 hours. No results for input(s): "AMMONIA" in the last 168 hours. Coagulation Profile: Recent Labs  Lab 05/10/24 1500  INR 1.1   Cardiac Enzymes: No results for input(s): "CKTOTAL", "CKMB", "CKMBINDEX", "TROPONINI" in the last 168 hours. BNP (last 3 results) No results for input(s): "PROBNP" in the last 8760 hours. HbA1C: No results for input(s): "HGBA1C" in the last 72 hours. CBG: Recent Labs  Lab  05/13/24 1635 05/13/24 2213 05/13/24 2216 05/14/24 0724 05/14/24 1112  GLUCAP 203* 435* 370* 196* 224*   Lipid Profile: No results for input(s): "CHOL", "HDL", "LDLCALC", "TRIG", "CHOLHDL", "LDLDIRECT" in the last 72 hours. Thyroid  Function Tests: No results for input(s): "TSH", "T4TOTAL", "FREET4", "T3FREE", "THYROIDAB" in the last 72 hours. Anemia Panel: No results for input(s): "VITAMINB12", "FOLATE", "FERRITIN", "TIBC", "IRON", "RETICCTPCT" in the last 72 hours. Sepsis Labs: Recent Labs  Lab 05/10/24 1500 05/10/24 1649 05/12/24 0519  PROCALCITON  --  <0.10  --   LATICACIDVEN 3.4* 2.5* 1.3    Recent Results (from the past 240 hours)  Urine Culture     Status: Abnormal   Collection Time:  05/10/24  2:33 PM   Specimen: Urine, Random  Result Value Ref Range Status   Specimen Description   Final    URINE, RANDOM Performed at [facility], [address], [place], [place] [address]    Special Requests   Final    NONE Reflexed from [id number] Performed at [facility], [address], [place], [place] [address]    Culture 80,000 COLONIES/mL ENTEROCOCCUS FAECALIS (A)  Final   Report Status 05/13/2024 FINAL  Final   Organism ID, Bacteria ENTEROCOCCUS FAECALIS (A)  Final      Susceptibility   Enterococcus faecalis - MIC*    AMPICILLIN <=2 SENSITIVE Sensitive     NITROFURANTOIN <=16 SENSITIVE Sensitive     VANCOMYCIN  >=32 RESISTANT Resistant     * 80,000 COLONIES/mL ENTEROCOCCUS FAECALIS  Resp panel by RT-PCR (RSV, Flu A&B, Covid) Anterior Nasal Swab     Status: None   Collection Time: 05/10/24  2:53 PM   Specimen: Anterior Nasal Swab  Result Value Ref Range Status   SARS Coronavirus 2 by RT PCR NEGATIVE NEGATIVE Final    Comment: (NOTE) SARS-CoV-2 target nucleic acids are NOT DETECTED.  The SARS-CoV-2 RNA is generally detectable in upper respiratory specimens during the acute phase of infection. The lowest concentration of SARS-CoV-2 viral copies this assay can detect  is 138 copies/mL. A negative result does not preclude SARS-Cov-2 infection and should not be used as the sole basis for treatment or other patient management decisions. A negative result may occur with  improper specimen collection/handling, submission of specimen other than nasopharyngeal swab, presence of viral mutation(s) within the areas targeted by this assay, and inadequate number of viral copies(<138 copies/mL). A negative result must be combined with clinical observations, patient history, and epidemiological information. The expected result is Negative.  Fact Sheet for Patients:  BloggerCourse.com  Fact Sheet for Healthcare Providers:  SeriousBroker.it  This test is no t yet approved or cleared by the [place]  FDA and  has been authorized for detection and/or diagnosis of SARS-CoV-2 by FDA under an Emergency Use Authorization (EUA). This EUA will remain  in effect (meaning this test can be used) for the duration of the COVID-19 declaration under Section 564(b)(1) of the Act, 21 U.S.C.section 360bbb-3(b)(1), unless the authorization is terminated  or revoked sooner.       Influenza A by PCR NEGATIVE NEGATIVE Final   Influenza B by PCR NEGATIVE NEGATIVE Final    Comment: (NOTE) The Xpert Xpress SARS-CoV-2/FLU/RSV plus assay is intended as an aid in the diagnosis of influenza from Nasopharyngeal swab specimens and should not be used as a sole basis for treatment. Nasal washings and aspirates are unacceptable for Xpert Xpress SARS-CoV-2/FLU/RSV testing.  Fact Sheet for Patients: BloggerCourse.com  Fact Sheet for Healthcare Providers: SeriousBroker.it  This test is not yet approved or cleared by the [place]  FDA and has been authorized for detection and/or diagnosis of SARS-CoV-2 by FDA under an Emergency Use Authorization (EUA). This EUA will remain in effect  (meaning this test can be used) for the duration of the COVID-19 declaration under Section 564(b)(1) of the Act, 21 U.S.C. section 360bbb-3(b)(1), unless the authorization is terminated or revoked.     Resp Syncytial Virus by PCR NEGATIVE NEGATIVE Final    Comment: (NOTE) Fact Sheet for Patients: BloggerCourse.com  Fact Sheet for Healthcare Providers: SeriousBroker.it  This test is not yet approved or cleared by the [place]  FDA and has been authorized for detection and/or diagnosis of SARS-CoV-2 by  FDA under an Emergency Use Authorization (EUA). This EUA will remain in effect (meaning this test can be used) for the duration of the COVID-19 declaration under Section 564(b)(1) of the Act, 21 U.S.C. section 360bbb-3(b)(1), unless the authorization is terminated or revoked.  Performed at [facility], [address] [place], [place] [address]   Blood Culture (routine x 2)     Status: None (Preliminary result)   Collection Time: 05/10/24  3:00 PM   Specimen: BLOOD  Result Value Ref Range Status   Specimen Description BLOOD LEFT ANTECUBITAL  Final   Special Requests   Final    BOTTLES DRAWN AEROBIC AND ANAEROBIC Blood Culture adequate volume   Culture   Final    NO GROWTH 3 DAYS Performed at [facility], [address], [place], [place] [address]    Report Status PENDING  Incomplete  Blood Culture (routine x 2)     Status: None (Preliminary result)   Collection Time: 05/10/24  3:00 PM   Specimen: BLOOD  Result Value Ref Range Status   Specimen Description BLOOD BLOOD LEFT HAND  Final   Special Requests   Final    AEROBIC BOTTLE ONLY Blood Culture results may not be optimal due to an inadequate volume of blood received in culture bottles   Culture   Final    NO GROWTH 3 DAYS Performed at [facility], [address] [place], [place] [address]    Report Status PENDING  Incomplete  MRSA Next Gen by PCR, Nasal     Status:  None   Collection Time: 05/11/24  8:30 AM   Specimen: Nasal Mucosa; Nasal Swab  Result Value Ref Range Status   MRSA by PCR Next Gen NOT DETECTED NOT DETECTED Final    Comment: (NOTE) The GeneXpert MRSA Assay (FDA approved for NASAL specimens only), is one component of a comprehensive MRSA colonization surveillance program. It is not intended to diagnose MRSA infection nor to guide or monitor treatment for MRSA infections. Test performance is not FDA approved in patients less than 99 years old. Performed at [facility], [address] [place], [place] [address]          Radiology Studies: No results found.       Scheduled Meds:  aspirin  EC  81 mg Oral Daily   atorvastatin   40 mg Oral Daily   baclofen   10 mg Oral QHS   clopidogrel   75 mg Oral Daily   cycloSPORINE   1 drop Both Eyes BID   dextromethorphan -guaiFENesin   1 tablet Oral BID   enoxaparin  (LOVENOX ) injection  40 mg Subcutaneous Q24H   fluticasone  furoate-vilanterol  1 puff Inhalation Daily   insulin  aspart  0-20 Units Subcutaneous TID WC   insulin  aspart  0-5 Units Subcutaneous QHS   insulin  aspart  5 Units Subcutaneous TID WC   insulin  glargine-yfgn  10 Units Subcutaneous QHS   levothyroxine   50 mcg Oral Q0600   mirabegron  ER  25 mg Oral Daily   pantoprazole   40 mg Oral Daily   [START ON 05/15/2024] predniSONE   40 mg Oral Q breakfast   pregabalin   150 mg Oral BID   senna  2 tablet Oral QHS   venlafaxine  XR  150 mg Oral BH-q7a   vortioxetine  HBr  10 mg Oral Daily   Continuous Infusions:  azithromycin  Stopped (05/13/24 2322)     LOS: 4 days    Time spent: 55 minutes    Rainer Mounce Loran Rock, DO [facility]  If 7PM-7AM,  please contact night-coverage [email or web address] 05/14/2024, 12:16 PM

## 2024-05-14 NOTE — Plan of Care (Signed)
  Problem: Education: Goal: Knowledge of General Education information will improve Description: Including pain rating scale, medication(s)/side effects and non-pharmacologic comfort measures Outcome: Progressing   Problem: Health Behavior/Discharge Planning: Goal: Ability to manage health-related needs will improve Outcome: Progressing   Problem: Clinical Measurements: Goal: Ability to maintain clinical measurements within normal limits will improve Outcome: Progressing Goal: Will remain free from infection Outcome: Progressing Goal: Diagnostic test results will improve Outcome: Progressing Goal: Respiratory complications will improve Outcome: Progressing Goal: Cardiovascular complication will be avoided Outcome: Progressing   Problem: Activity: Goal: Risk for activity intolerance will decrease Outcome: Progressing   Problem: Nutrition: Goal: Adequate nutrition will be maintained Outcome: Progressing   Problem: Coping: Goal: Level of anxiety will decrease Outcome: Progressing   Problem: Elimination: Goal: Will not experience complications related to bowel motility Outcome: Progressing Goal: Will not experience complications related to urinary retention Outcome: Progressing   Problem: Pain Managment: Goal: General experience of comfort will improve and/or be controlled Outcome: Progressing   Problem: Safety: Goal: Ability to remain free from injury will improve Outcome: Progressing   Problem: Skin Integrity: Goal: Risk for impaired skin integrity will decrease Outcome: Progressing   Problem: Education: Goal: Knowledge of disease or condition will improve Outcome: Progressing Goal: Knowledge of the prescribed therapeutic regimen will improve Outcome: Progressing Goal: Individualized Educational Video(s) Outcome: Progressing   Problem: Activity: Goal: Ability to tolerate increased activity will improve Outcome: Progressing Goal: Will verbalize the  importance of balancing activity with adequate rest periods Outcome: Progressing   Problem: Respiratory: Goal: Ability to maintain a clear airway will improve Outcome: Progressing Goal: Levels of oxygenation will improve Outcome: Progressing Goal: Ability to maintain adequate ventilation will improve Outcome: Progressing   Problem: Activity: Goal: Ability to tolerate increased activity will improve Outcome: Progressing   Problem: Clinical Measurements: Goal: Ability to maintain a body temperature in the normal range will improve Outcome: Progressing   Problem: Respiratory: Goal: Ability to maintain adequate ventilation will improve Outcome: Progressing Goal: Ability to maintain a clear airway will improve Outcome: Progressing   Problem: Education: Goal: Ability to describe self-care measures that may prevent or decrease complications (Diabetes Survival Skills Education) will improve Outcome: Progressing Goal: Individualized Educational Video(s) Outcome: Progressing   Problem: Coping: Goal: Ability to adjust to condition or change in health will improve Outcome: Progressing   Problem: Fluid Volume: Goal: Ability to maintain a balanced intake and output will improve Outcome: Progressing   Problem: Health Behavior/Discharge Planning: Goal: Ability to identify and utilize available resources and services will improve Outcome: Progressing Goal: Ability to manage health-related needs will improve Outcome: Progressing   Problem: Metabolic: Goal: Ability to maintain appropriate glucose levels will improve Outcome: Progressing   Problem: Nutritional: Goal: Maintenance of adequate nutrition will improve Outcome: Progressing Goal: Progress toward achieving an optimal weight will improve Outcome: Progressing   Problem: Skin Integrity: Goal: Risk for impaired skin integrity will decrease Outcome: Progressing   Problem: Tissue Perfusion: Goal: Adequacy of tissue  perfusion will improve Outcome: Progressing

## 2024-05-14 NOTE — Inpatient Diabetes Management (Signed)
 Inpatient Diabetes Program Recommendations  AACE/ADA: New Consensus Statement on Inpatient Glycemic Control   Target Ranges:  Prepandial:   less than 140 mg/dL      Peak postprandial:   less than 180 mg/dL (1-2 hours)      Critically ill patients:  140 - 180 mg/dL    Latest Reference Range & Units 05/13/24 07:38 05/13/24 11:58 05/13/24 16:35 05/13/24 22:13 05/13/24 22:16 05/14/24 07:24  Glucose-Capillary 70 - 99 mg/dL 76 161 (H) 096 (H) 045 (H) 370 (H) 196 (H)   Review of Glycemic Control  Diabetes history: DM2 Outpatient Diabetes medications: Metformin  500 mg BID Current orders for Inpatient glycemic control: Semglee  10 units at bedtime, Novolog  0-20 units TID with meals, Novolog  0-5 units at bedtime, Novolog  3 units TID with meals; Solumedrol 40 mg Q12H   Inpatient Diabetes Program Recommendations:    Insulin : If steroids are continued as ordered, consider increasing meal coverage to Novolog  5 units TID with meals.  Thanks, Beacher Limerick, RN, MSN, CDCES Diabetes Coordinator Inpatient Diabetes Program [phone] (Team Pager from 8am to 5pm)

## 2024-05-15 DIAGNOSIS — J9621 Acute and chronic respiratory failure with hypoxia: Secondary | ICD-10-CM

## 2024-05-15 DIAGNOSIS — I10 Essential (primary) hypertension: Secondary | ICD-10-CM | POA: Diagnosis not present

## 2024-05-15 DIAGNOSIS — J189 Pneumonia, unspecified organism: Secondary | ICD-10-CM | POA: Diagnosis not present

## 2024-05-15 DIAGNOSIS — A419 Sepsis, unspecified organism: Secondary | ICD-10-CM | POA: Diagnosis not present

## 2024-05-15 LAB — CULTURE, BLOOD (ROUTINE X 2)
Culture: NO GROWTH
Culture: NO GROWTH
Special Requests: ADEQUATE

## 2024-05-15 LAB — BASIC METABOLIC PANEL WITH GFR
Anion gap: 9 (ref 5–15)
BUN: 14 mg/dL (ref 8–23)
CO2: 24 mmol/L (ref 22–32)
Calcium: 8.3 mg/dL — ABNORMAL LOW (ref 8.9–10.3)
Chloride: 108 mmol/L (ref 98–111)
Creatinine, Ser: 0.63 mg/dL (ref 0.44–1.00)
GFR, Estimated: >60 mL/min (ref >60)
Glucose, Bld: 97 mg/dL (ref 70–99)
Potassium: 3.9 mmol/L (ref 3.5–5.1)
Sodium: 141 mmol/L (ref 135–145)

## 2024-05-15 LAB — CBC
HCT: 32.3 % — ABNORMAL LOW (ref 36.0–46.0)
Hemoglobin: 10.4 g/dL — ABNORMAL LOW (ref 12.0–15.0)
MCH: 29 pg (ref 26.0–34.0)
MCHC: 32.2 g/dL (ref 30.0–36.0)
MCV: 90 fL (ref 80.0–100.0)
Platelets: 199 10*3/uL (ref 150–400)
RBC: 3.59 MIL/uL — ABNORMAL LOW (ref 3.87–5.11)
RDW: 16.9 % — ABNORMAL HIGH (ref 11.5–15.5)
WBC: 15.3 10*3/uL — ABNORMAL HIGH (ref 4.0–10.5)
nRBC: 0.3 % — ABNORMAL HIGH (ref 0.0–0.2)

## 2024-05-15 LAB — GLUCOSE, CAPILLARY
Glucose-Capillary: 77 mg/dL (ref 70–99)
Glucose-Capillary: 182 mg/dL — ABNORMAL HIGH (ref 70–99)

## 2024-05-15 LAB — MAGNESIUM: Magnesium: 1.8 mg/dL (ref 1.7–2.4)

## 2024-05-15 MED ORDER — CLOPIDOGREL BISULFATE 75 MG PO TABS
75.0000 mg | ORAL_TABLET | Freq: Every day | ORAL | Status: DC
Start: 2024-05-15 — End: 2024-07-02

## 2024-05-15 MED ORDER — PREDNISONE 20 MG PO TABS: 40.0000 mg | ORAL_TABLET | Freq: Every day | ORAL | 0 refills | Status: AC

## 2024-05-15 NOTE — Care Management Important Message (Signed)
 Important Message  Patient Details  Name: [name] White MRN: [id number] Date of Birth: September 20, 1941   Important Message Given:  Yes - Medicare IM     [name] L [name] 05/15/2024, 11:15 AM

## 2024-05-15 NOTE — [place] FL2 (Signed)
 [place]  MEDICAID FL2 LEVEL OF CARE FORM     IDENTIFICATION  Patient Name: [name] White Birthdate: Nov 07, 1941 Sex: female Admission Date (Current Location): 05/10/2024  [facility] and [place] Number:  [employer] and Address:  [facility],  [address], [name] [address]      Provider Number: [id number]  Attending Physician Name and Address:  [name] Cairo, MD  Relative Name and Phone Number:  [name] White (Daughter) [phone]    Current Level of Care: Hospital Recommended Level of Care: Assisted Living Facility Prior Approval Number:    Date Approved/Denied:   PASRR Number:    Discharge Plan: Other (Comment) (ALF)    Current Diagnoses: Patient Active Problem List   Diagnosis Date Noted   Severe sepsis (HCC) 05/10/2024   Acute on chronic respiratory failure with hypoxia (HCC) 05/10/2024   Hypomagnesemia 05/01/2024   Septic shock (HCC) 04/24/2024   CAP (community acquired pneumonia) 04/24/2024   Acute respiratory failure with hypoxia (HCC) 04/24/2024   CVA (cerebral vascular accident) (HCC) 03/12/2024   Acute lower UTI 03/12/2024   Type 2 diabetes mellitus with peripheral neuropathy (HCC) 03/12/2024   Anxiety and depression 03/12/2024   GERD without esophagitis 03/12/2024   Acute encephalopathy 03/11/2024   Rhabdomyolysis 12/27/2023   Pressure injury of skin 12/27/2023   UTI (urinary tract infection) 12/26/2023   Sepsis due to urinary tract infection (HCC) 12/26/2023   Acute metabolic encephalopathy 12/26/2023   Status post transsphenoidal pituitary resection (HCC) 06/04/2020   Pituitary adenoma with extrasellar extension (HCC) 06/04/2020   Pituitary tumor 05/29/2020   Dizzinesses 02/02/2020   Acute pain of right shoulder 02/02/2020   Encounter for screening mammogram for malignant neoplasm of breast 01/28/2020   Vitamin D  deficiency 01/28/2020   Paroxysmal supraventricular tachycardia (HCC) 06/19/2014   Rectocele 03/19/2013    Constipation 03/19/2013   HYPERCHOLESTEROLEMIA 02/09/2009   Essential hypertension 02/09/2009   Osteoporosis 02/09/2009   Post-menopausal 02/09/2009   HEAT INTOLERANCE 02/01/2008   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   Hypothyroidism 09/14/2007   Diabetes (HCC) 09/14/2007   Migraine headache 09/14/2007   SUPERFICIAL PHLEBITIS 09/14/2007   PANCREATITIS 09/14/2007   MENOPAUSAL SYNDROME 09/14/2007   FATIGUE 09/14/2007    Orientation RESPIRATION BLADDER Height & Weight     Self, Situation, Time, Place  Normal Incontinent Weight: 129 lb 13.6 oz (58.9 kg) Height:  5\' 5"  (165.1 cm)  BEHAVIORAL SYMPTOMS/MOOD NEUROLOGICAL BOWEL NUTRITION STATUS      Continent Diet (Regular)  AMBULATORY STATUS COMMUNICATION OF NEEDS Skin   Limited Assist Verbally Normal                       Personal Care Assistance Level of Assistance  Bathing, Feeding, Dressing Bathing Assistance: Limited assistance Feeding assistance: Independent Dressing Assistance: Limited assistance     Functional Limitations Info  Sight, Hearing, Speech Sight Info: Impaired Hearing Info: Impaired Speech Info: Adequate    SPECIAL CARE FACTORS FREQUENCY  PT (By licensed PT)     PT Frequency: 2- 3 times a week with [facility]              Contractures Contractures Info: Not present    Additional Factors Info  Code Status, Allergies Code Status Info: DNR-Limited Allergies Info: Betadine and Penicillins           Current Medications (05/15/2024):  This is the current hospital active medication list Current Facility-Administered Medications  Medication Dose Route Frequency Provider Last Rate Last Admin  acetaminophen  (TYLENOL ) tablet 650 mg  650 mg Oral Q6H PRN White, [name] J, DO   650 mg at 05/11/24 2142   Or   acetaminophen  (TYLENOL ) suppository 650 mg  650 mg Rectal Q6H PRN White, [name] J, DO       ALPRAZolam  (XANAX ) tablet 0.25 mg  0.25 mg Oral TID PRN [name] White, [name] D, DO   0.25 mg at  05/14/24 1120   aspirin  EC tablet 81 mg  81 mg Oral Daily White, [name] J, DO   81 mg at 05/15/24 1113   atorvastatin  (LIPITOR) tablet 40 mg  40 mg Oral Daily White, [name] J, DO   40 mg at 05/15/24 1112   baclofen  (LIORESAL ) tablet 10 mg  10 mg Oral QHS White, [name] J, DO   10 mg at 05/14/24 2213   clopidogrel  (PLAVIX ) tablet 75 mg  75 mg Oral Daily White, [name] J, DO   75 mg at 05/15/24 1113   cycloSPORINE  (RESTASIS ) 0.05 % ophthalmic emulsion 1 drop  1 drop Both Eyes BID White, [name] J, DO   1 drop at 05/15/24 1113   dextromethorphan -guaiFENesin  (MUCINEX  DM) 30-600 MG per 12 hr tablet 1 tablet  1 tablet Oral BID [name] White, [name] D, DO   1 tablet at 05/15/24 1112   enoxaparin  (LOVENOX ) injection 40 mg  40 mg Subcutaneous Q24H White, [name] J, DO   40 mg at 05/14/24 2213   fluticasone  furoate-vilanterol (BREO ELLIPTA ) 200-25 MCG/ACT 1 puff  1 puff Inhalation Daily White, [name] J, DO   1 puff at 05/15/24 0755   insulin  aspart (novoLOG ) injection 0-20 Units  0-20 Units Subcutaneous TID WC White, [name] J, DO   4 Units at 05/15/24 1356   insulin  aspart (novoLOG ) injection 0-5 Units  0-5 Units Subcutaneous QHS White, [name] J, DO   3 Units at 05/14/24 2213   insulin  aspart (novoLOG ) injection 5 Units  5 Units Subcutaneous TID WC White, [name] D, DO   5 Units at 05/15/24 1356   insulin  glargine-yfgn (SEMGLEE ) injection 10 Units  10 Units Subcutaneous QHS White, [name] D, DO   10 Units at 05/14/24 2213   levalbuterol  (XOPENEX ) nebulizer solution 0.63 mg  0.63 mg Nebulization Q6H PRN White, [name] J, DO   0.63 mg at 05/12/24 0920   levothyroxine  (SYNTHROID ) tablet 50 mcg  50 mcg Oral Q0600 White, [name] J, DO   50 mcg at 05/15/24 0540   mirabegron  ER (MYRBETRIQ ) tablet 25 mg  25 mg Oral Daily White, [name] J, DO   25 mg at 05/15/24 1113   ondansetron  (ZOFRAN ) tablet 4 mg  4 mg Oral Q6H PRN White, [name] J, DO       Or   ondansetron  (ZOFRAN ) injection 4 mg  4 mg Intravenous Q6H PRN White,  [name] J, DO       pantoprazole  (PROTONIX ) EC tablet 40 mg  40 mg Oral Daily White, [name] J, DO   40 mg at 05/15/24 1112   predniSONE  (DELTASONE ) tablet 40 mg  40 mg Oral Q breakfast White, [name] D, DO   40 mg at 05/15/24 1113   pregabalin  (LYRICA ) capsule 150 mg  150 mg Oral BID White, [name] J, DO   150 mg at 05/15/24 1128   senna (SENOKOT) tablet 17.2 mg  2 tablet Oral QHS White, [name] J, DO   17.2 mg at 05/14/24 2212   traZODone  (DESYREL ) tablet 50 mg  50 mg Oral QHS PRN White, [name] J, DO  venlafaxine  XR (EFFEXOR -XR) 24 hr capsule 150 mg  150 mg Oral BH-q7a White, [name] J, DO   150 mg at 05/15/24 0540   vortioxetine  HBr (TRINTELLIX ) tablet 10 mg  10 mg Oral Daily White, [name] J, DO   10 mg at 05/15/24 1128     Discharge Medications: Allergies as of 05/15/2024       Reactions   Betadine [povidone Iodine] Hives   Penicillins Rash        Medication List     STOP taking these medications    guaiFENesin  600 MG 12 hr tablet Commonly known as: MUCINEX    nitrofurantoin (macrocrystal-monohydrate) 100 MG capsule Commonly known as: MACROBID       TAKE these medications    acetaminophen  325 MG tablet Commonly known as: TYLENOL  Take 2 tablets (650 mg total) by mouth every 6 (six) hours as needed for mild pain (pain score 1-3) (or Fever >/= 101).   albuterol  108 (90 Base) MCG/ACT inhaler Commonly known as: VENTOLIN  HFA Inhale 2 puffs into the lungs every 4 (four) hours as needed for wheezing or shortness of breath.   aspirin  EC 81 MG tablet Take 81 mg by mouth daily. Swallow whole.   atorvastatin  40 MG tablet Commonly known as: LIPITOR Take 1 tablet (40 mg total) by mouth daily.   baclofen  10 MG tablet Commonly known as: LIORESAL  Take 1 tablet (10 mg total) by mouth at bedtime.   clopidogrel  75 MG tablet Commonly known as: PLAVIX  Take 1 tablet (75 mg total) by mouth daily.   cycloSPORINE  0.05 % ophthalmic emulsion Commonly known as: RESTASIS  Place 1  drop into both eyes 2 (two) times daily.   dextromethorphan  30 MG/5ML liquid Commonly known as: Delsym  Take 5 mLs (30 mg total) by mouth 2 (two) times daily as needed for cough.   dextromethorphan -guaiFENesin  30-600 MG 12hr tablet Commonly known as: MUCINEX  DM Take 1 tablet by mouth 2 (two) times daily for 7 days.   fluticasone  furoate-vilanterol 200-25 MCG/ACT Aepb Commonly known as: Breo Ellipta  Inhale 1 puff into the lungs daily.   furosemide  20 MG tablet Commonly known as: Lasix  Take 1 tablet (20 mg total) by mouth daily.   Gemtesa 75 MG Tabs Generic drug: Vibegron Take 75 mg by mouth at bedtime.   ipratropium-albuterol  0.5-2.5 (3) MG/3ML Soln Commonly known as: DUONEB Take 3 mLs by nebulization every 4 (four) hours as needed (wheezing, shortness of breath).   levothyroxine  50 MCG tablet Commonly known as: SYNTHROID  Take 50 mcg by mouth daily.   metFORMIN  500 MG tablet Commonly known as: GLUCOPHAGE  Take 500 mg by mouth 2 (two) times daily.   multivitamins ther. w/minerals Tabs tablet Take 1 tablet by mouth every morning.   omeprazole 20 MG capsule Commonly known as: PRILOSEC Take 20 mg by mouth every morning.   polyethylene glycol 17 g packet Commonly known as: MIRALAX  / GLYCOLAX  Take 17 g by mouth daily as needed for mild constipation.   potassium chloride  10 MEQ tablet Commonly known as: KLOR-CON  M Take 1 tablet (10 mEq total) by mouth daily.   predniSONE  20 MG tablet Commonly known as: DELTASONE  Take 2 tablets (40 mg total) by mouth daily with breakfast for 5 days. Start taking on: May 16, 2024 What changed: additional instructions   pregabalin  150 MG capsule Commonly known as: LYRICA  Take 1 capsule (150 mg total) by mouth 2 (two) times daily.   rizatriptan  10 MG tablet Commonly known as: MAXALT  Take 10 mg by mouth every 2 (  two) hours as needed for migraine. No more than 2 doses in 24 hours   senna 8.6 MG Tabs tablet Commonly known as:  SENOKOT Take 2 tablets by mouth at bedtime.   traZODone  50 MG tablet Commonly known as: DESYREL  Take 1 tablet (50 mg total) by mouth at bedtime as needed for sleep.   Trintellix  10 MG Tabs tablet Generic drug: vortioxetine  HBr Take 10 mg by mouth daily.   venlafaxine  XR 150 MG 24 hr capsule Commonly known as: EFFEXOR -XR Take 150 mg by mouth every morning.         Relevant Imaging Results:  Relevant Lab Results:   Additional Information SSN: [id number] LCSWA

## 2024-05-15 NOTE — TOC Transition Note (Signed)
 Transition of Care [facility]) - Discharge Note   Patient Details  Name: [name] White MRN: [id number] Date of Birth: 06-Jun-1941  [facility] CM/SW Contact:  Grandville Lax, LCSWA Phone Number: 05/15/2024, 2:49 PM  Clinical Narrative:    CSW updated that pt is medically stable for D/C. CSW spoke to [place] with [facility] to provide update. CSW sent D/C summary and updated Fl2 with med list to ALF for review. CSW spoke to [place] to confirm they are ready for pt to return. CSW updated RN to get pt ready for D/C. [facility] states they will call floor to update what time they will arrive to pick up pt. HH PT/RN orders placed, Sarah with [facility] updated on plan for D/C today. CSW spoke with pts daughter to provide update. TOC signing off.   Final next level of care: Assisted Living Barriers to Discharge: Barriers Resolved   Patient Goals and CMS Choice Patient states their goals for this hospitalization and ongoing recovery are:: return to ALF CMS Medicare.gov Compare Post Acute Care list provided to:: Patient Represenative (must comment) Choice offered to / list presented to : Adult Children      Discharge Placement                Patient to be transferred to facility by: Facility staff Name of family member notified: Daughter Edwina Gram Patient and family notified of of transfer: 05/15/24  Discharge Plan and Services Additional resources added to the After Visit Summary for                            [facility] Arranged: PT, RN [facility] Agency: [facility] Date [facility] Agency Contacted: 05/15/24   Representative spoke with at [facility] Agency: Isa Manuel  Social Drivers of Health (SDOH) Interventions SDOH Screenings   Food Insecurity: No Food Insecurity (05/10/2024)  Housing: High Risk (05/10/2024)  Transportation Needs: No Transportation Needs (05/10/2024)  Utilities: Not At Risk (05/10/2024)  Depression (PHQ2-9): Low Risk  (01/28/2020)  Social Connections: Socially Isolated  (05/10/2024)  Tobacco Use: Low Risk  (05/10/2024)     Readmission Risk Interventions    04/25/2024    1:46 PM 12/27/2023    1:21 PM  Readmission Risk Prevention Plan  Transportation Screening Complete Complete  PCP or Specialist Appt within 3-5 Days Not Complete   Home Care Screening  Complete  Medication Review (RN CM)  Complete  HRI or Home Care Consult Complete   Social Work Consult for Recovery Care Planning/Counseling Complete   Palliative Care Screening Not Complete   Medication Review Oceanographer) Complete

## 2024-05-15 NOTE — Plan of Care (Signed)
  Problem: Education: Goal: Knowledge of General Education information will improve Description: Including pain rating scale, medication(s)/side effects and non-pharmacologic comfort measures Outcome: Progressing   Problem: Health Behavior/Discharge Planning: Goal: Ability to manage health-related needs will improve Outcome: Progressing   Problem: Clinical Measurements: Goal: Ability to maintain clinical measurements within normal limits will improve Outcome: Progressing Goal: Will remain free from infection Outcome: Progressing Goal: Diagnostic test results will improve Outcome: Progressing Goal: Respiratory complications will improve Outcome: Progressing Goal: Cardiovascular complication will be avoided Outcome: Progressing   Problem: Activity: Goal: Risk for activity intolerance will decrease Outcome: Progressing   Problem: Nutrition: Goal: Adequate nutrition will be maintained Outcome: Progressing   Problem: Coping: Goal: Level of anxiety will decrease Outcome: Progressing   Problem: Elimination: Goal: Will not experience complications related to bowel motility Outcome: Progressing Goal: Will not experience complications related to urinary retention Outcome: Progressing   Problem: Pain Managment: Goal: General experience of comfort will improve and/or be controlled Outcome: Progressing   Problem: Safety: Goal: Ability to remain free from injury will improve Outcome: Progressing   Problem: Skin Integrity: Goal: Risk for impaired skin integrity will decrease Outcome: Progressing   Problem: Education: Goal: Knowledge of disease or condition will improve Outcome: Progressing Goal: Knowledge of the prescribed therapeutic regimen will improve Outcome: Progressing Goal: Individualized Educational Video(s) Outcome: Progressing   Problem: Activity: Goal: Ability to tolerate increased activity will improve Outcome: Progressing Goal: Will verbalize the  importance of balancing activity with adequate rest periods Outcome: Progressing   Problem: Respiratory: Goal: Ability to maintain a clear airway will improve Outcome: Progressing Goal: Levels of oxygenation will improve Outcome: Progressing Goal: Ability to maintain adequate ventilation will improve Outcome: Progressing   Problem: Activity: Goal: Ability to tolerate increased activity will improve Outcome: Progressing   Problem: Clinical Measurements: Goal: Ability to maintain a body temperature in the normal range will improve Outcome: Progressing   Problem: Respiratory: Goal: Ability to maintain adequate ventilation will improve Outcome: Progressing Goal: Ability to maintain a clear airway will improve Outcome: Progressing   Problem: Education: Goal: Ability to describe self-care measures that may prevent or decrease complications (Diabetes Survival Skills Education) will improve Outcome: Progressing Goal: Individualized Educational Video(s) Outcome: Progressing   Problem: Coping: Goal: Ability to adjust to condition or change in health will improve Outcome: Progressing   Problem: Fluid Volume: Goal: Ability to maintain a balanced intake and output will improve Outcome: Progressing   Problem: Health Behavior/Discharge Planning: Goal: Ability to identify and utilize available resources and services will improve Outcome: Progressing Goal: Ability to manage health-related needs will improve Outcome: Progressing   Problem: Metabolic: Goal: Ability to maintain appropriate glucose levels will improve Outcome: Progressing   Problem: Nutritional: Goal: Maintenance of adequate nutrition will improve Outcome: Progressing Goal: Progress toward achieving an optimal weight will improve Outcome: Progressing   Problem: Skin Integrity: Goal: Risk for impaired skin integrity will decrease Outcome: Progressing   Problem: Tissue Perfusion: Goal: Adequacy of tissue  perfusion will improve Outcome: Progressing

## 2024-05-15 NOTE — Progress Notes (Signed)
 Pt discharged via WC to transport van for travel to ALF [facility]. Pt with personal belongings in her possession.

## 2024-05-15 NOTE — Progress Notes (Signed)
 Mobility Specialist Progress Note:    05/15/24 1030  Mobility  Activity Ambulated with assistance in room;Stood at bedside;Transferred from bed to chair  Level of Assistance Contact guard assist, steadying assist  Assistive Device None  Distance Ambulated (ft) 5 ft  Range of Motion/Exercises Active;All extremities  Activity Response Tolerated well  Mobility Referral Yes  Mobility visit 1 Mobility  Mobility Specialist Start Time (ACUTE ONLY) 1000  Mobility Specialist Stop Time (ACUTE ONLY) 1020  Mobility Specialist Time Calculation (min) (ACUTE ONLY) 20 min   Pt received sitting EOB, agreeable to mobility. Required CGA to stand and ambulate with RW. Tolerated well, NT assisted pt with necessary peri care. Left pt in chair, alarm on and call bell in reach. All needs met.  Glinda Lapping Mobility Specialist Please contact via Special educational needs teacher or  Rehab office at [phone]

## 2024-05-15 NOTE — Discharge Summary (Addendum)
 Physician Discharge Summary  VENUS GILLES ZOX:[id number] DOB: 1941/03/18 DOA: 05/10/2024  PCP: Omie Bickers, MD  Admit date: 05/10/2024 Discharge date: 05/15/2024  Disposition:  [place]  Recommendations for Outpatient Follow-up:  Follow up with PCP in 1 weeks Please monitor blood sugars at least 3-4 times per day Please check CBC in 2 weeks Please recheck blood pressure once daily   Discharge Condition: STABLE   CODE STATUS: DNR  DIET: heart healthy foods recommended    Brief Hospitalization Summary: Please see all hospital notes, images, labs for full details of the hospitalization. Admission provider HPI:  83 y.o. female with medical history significant of diabetes, hypertension, hypothyroidism, chronic pain.  Patient presents from skilled nursing facility due to increasing shortness of breath that started this morning.  She was admitted here before at the beginning of the month for sepsis from pneumonia that resulted in ICU stay and needing to be on pressors.  She was discharged on antibiotics and steroids for an additional 5 days.  She has now been readmitted with severe sepsis secondary to findings of pneumonia and started on IV antibiotics, but appears to have more of a COPD exacerbation picture.  Oxygen has been weaned and she was even able to ambulate without oxygen use, but continues to have subjective dyspnea and insists on 1 more day of hospitalization prior to discharge.  Working on anxiety component with Xanax .  Anticipate discharge in a.m. once further improved.   Hospital Course by problems listed  Acute hypoxemic respiratory failure secondary to COPD exacerbation with possible atypical pneumonia-improving - pt was treated with azithromycin   - Leukocytosis likely related to steroid use - IV Solu-Medrol  to oral prednisone  started 5/21 - Mucinex  for congestion - Lactic acidosis resolved - Weaned to room air, ambulates without oxygen   History of CVA -Continue aspirin   and Plavix  per original orders prior to arrival   Hypertension -Hold antihypertensives due to soft blood pressure readings   Type 2 diabetes with labile control -resume home treatments prior to arrival  -CBG monitoring recommended at least 3-4 times per day  CBG (last 3)  Recent Labs    05/14/24 2059 05/15/24 0719 05/15/24 1111  GLUCAP 283* 77 182*   Hypothyroidism -resume home Levothyroxine    Discharge Diagnoses:  Principal Problem:   Severe sepsis (HCC) Active Problems:   Hypothyroidism   Diabetes (HCC)   Essential hypertension   CVA (cerebral vascular accident) (HCC)   CAP (community acquired pneumonia)   Acute on chronic respiratory failure with hypoxia [facility])   Discharge Instructions: Discharge Instructions     Diet - low sodium heart healthy   Complete by: As directed    Increase activity slowly   Complete by: As directed    No wound care   Complete by: As directed    Pulmonary Visit   Complete by: As directed    Reason for referral: Other Pulmonary      Allergies as of 05/15/2024       Reactions   Betadine [povidone Iodine] Hives   Penicillins Rash        Medication List     STOP taking these medications    guaiFENesin  600 MG 12 hr tablet Commonly known as: MUCINEX    nitrofurantoin (macrocrystal-monohydrate) 100 MG capsule Commonly known as: MACROBID       TAKE these medications    acetaminophen  325 MG tablet Commonly known as: TYLENOL  Take 2 tablets (650 mg total) by mouth every 6 (six) hours as needed  for mild pain (pain score 1-3) (or Fever >/= 101).   albuterol  108 (90 Base) MCG/ACT inhaler Commonly known as: VENTOLIN  HFA Inhale 2 puffs into the lungs every 4 (four) hours as needed for wheezing or shortness of breath.   aspirin  EC 81 MG tablet Take 81 mg by mouth daily. Swallow whole.   atorvastatin  40 MG tablet Commonly known as: LIPITOR Take 1 tablet (40 mg total) by mouth daily.   baclofen  10 MG tablet Commonly known  as: LIORESAL  Take 1 tablet (10 mg total) by mouth at bedtime.   clopidogrel  75 MG tablet Commonly known as: PLAVIX  Take 1 tablet (75 mg total) by mouth daily.   cycloSPORINE  0.05 % ophthalmic emulsion Commonly known as: RESTASIS  Place 1 drop into both eyes 2 (two) times daily.   dextromethorphan  30 MG/5ML liquid Commonly known as: Delsym  Take 5 mLs (30 mg total) by mouth 2 (two) times daily as needed for cough.   dextromethorphan -guaiFENesin  30-600 MG 12hr tablet Commonly known as: MUCINEX  DM Take 1 tablet by mouth 2 (two) times daily for 7 days.   fluticasone  furoate-vilanterol 200-25 MCG/ACT Aepb Commonly known as: Breo Ellipta  Inhale 1 puff into the lungs daily.   furosemide  20 MG tablet Commonly known as: Lasix  Take 1 tablet (20 mg total) by mouth daily.   Gemtesa 75 MG Tabs Generic drug: Vibegron Take 75 mg by mouth at bedtime.   ipratropium-albuterol  0.5-2.5 (3) MG/3ML Soln Commonly known as: DUONEB Take 3 mLs by nebulization every 4 (four) hours as needed (wheezing, shortness of breath).   levothyroxine  50 MCG tablet Commonly known as: SYNTHROID  Take 50 mcg by mouth daily.   metFORMIN  500 MG tablet Commonly known as: GLUCOPHAGE  Take 500 mg by mouth 2 (two) times daily.   multivitamins ther. w/minerals Tabs tablet Take 1 tablet by mouth every morning.   omeprazole 20 MG capsule Commonly known as: PRILOSEC Take 20 mg by mouth every morning.   polyethylene glycol 17 g packet Commonly known as: MIRALAX  / GLYCOLAX  Take 17 g by mouth daily as needed for mild constipation.   potassium chloride  10 MEQ tablet Commonly known as: KLOR-CON  M Take 1 tablet (10 mEq total) by mouth daily.   predniSONE  20 MG tablet Commonly known as: DELTASONE  Take 2 tablets (40 mg total) by mouth daily with breakfast for 5 days. Start taking on: May 16, 2024 What changed: additional instructions   pregabalin  150 MG capsule Commonly known as: LYRICA  Take 1 capsule (150 mg  total) by mouth 2 (two) times daily.   rizatriptan  10 MG tablet Commonly known as: MAXALT  Take 10 mg by mouth every 2 (two) hours as needed for migraine. No more than 2 doses in 24 hours   senna 8.6 MG Tabs tablet Commonly known as: SENOKOT Take 2 tablets by mouth at bedtime.   traZODone  50 MG tablet Commonly known as: DESYREL  Take 1 tablet (50 mg total) by mouth at bedtime as needed for sleep.   Trintellix  10 MG Tabs tablet Generic drug: vortioxetine  HBr Take 10 mg by mouth daily.   venlafaxine  XR 150 MG 24 hr capsule Commonly known as: EFFEXOR -XR Take 150 mg by mouth every morning.        Follow-up Information     Omie Bickers, MD. Schedule an appointment as soon as possible for a visit in 1 week(s).   Specialty: Internal Medicine Contact information: [address] [place] [address] [phone]  Allergies  Allergen Reactions   Betadine [Povidone Iodine] Hives   Penicillins Rash   Allergies as of 05/15/2024       Reactions   Betadine [povidone Iodine] Hives   Penicillins Rash        Medication List     STOP taking these medications    guaiFENesin  600 MG 12 hr tablet Commonly known as: MUCINEX    nitrofurantoin (macrocrystal-monohydrate) 100 MG capsule Commonly known as: MACROBID       TAKE these medications    acetaminophen  325 MG tablet Commonly known as: TYLENOL  Take 2 tablets (650 mg total) by mouth every 6 (six) hours as needed for mild pain (pain score 1-3) (or Fever >/= 101).   albuterol  108 (90 Base) MCG/ACT inhaler Commonly known as: VENTOLIN  HFA Inhale 2 puffs into the lungs every 4 (four) hours as needed for wheezing or shortness of breath.   aspirin  EC 81 MG tablet Take 81 mg by mouth daily. Swallow whole.   atorvastatin  40 MG tablet Commonly known as: LIPITOR Take 1 tablet (40 mg total) by mouth daily.   baclofen  10 MG tablet Commonly known as: LIORESAL  Take 1 tablet (10 mg total) by mouth at  bedtime.   clopidogrel  75 MG tablet Commonly known as: PLAVIX  Take 1 tablet (75 mg total) by mouth daily.   cycloSPORINE  0.05 % ophthalmic emulsion Commonly known as: RESTASIS  Place 1 drop into both eyes 2 (two) times daily.   dextromethorphan  30 MG/5ML liquid Commonly known as: Delsym  Take 5 mLs (30 mg total) by mouth 2 (two) times daily as needed for cough.   dextromethorphan -guaiFENesin  30-600 MG 12hr tablet Commonly known as: MUCINEX  DM Take 1 tablet by mouth 2 (two) times daily for 7 days.   fluticasone  furoate-vilanterol 200-25 MCG/ACT Aepb Commonly known as: Breo Ellipta  Inhale 1 puff into the lungs daily.   furosemide  20 MG tablet Commonly known as: Lasix  Take 1 tablet (20 mg total) by mouth daily.   Gemtesa 75 MG Tabs Generic drug: Vibegron Take 75 mg by mouth at bedtime.   ipratropium-albuterol  0.5-2.5 (3) MG/3ML Soln Commonly known as: DUONEB Take 3 mLs by nebulization every 4 (four) hours as needed (wheezing, shortness of breath).   levothyroxine  50 MCG tablet Commonly known as: SYNTHROID  Take 50 mcg by mouth daily.   metFORMIN  500 MG tablet Commonly known as: GLUCOPHAGE  Take 500 mg by mouth 2 (two) times daily.   multivitamins ther. w/minerals Tabs tablet Take 1 tablet by mouth every morning.   omeprazole 20 MG capsule Commonly known as: PRILOSEC Take 20 mg by mouth every morning.   polyethylene glycol 17 g packet Commonly known as: MIRALAX  / GLYCOLAX  Take 17 g by mouth daily as needed for mild constipation.   potassium chloride  10 MEQ tablet Commonly known as: KLOR-CON  M Take 1 tablet (10 mEq total) by mouth daily.   predniSONE  20 MG tablet Commonly known as: DELTASONE  Take 2 tablets (40 mg total) by mouth daily with breakfast for 5 days. Start taking on: May 16, 2024 What changed: additional instructions   pregabalin  150 MG capsule Commonly known as: LYRICA  Take 1 capsule (150 mg total) by mouth 2 (two) times daily.   rizatriptan  10 MG  tablet Commonly known as: MAXALT  Take 10 mg by mouth every 2 (two) hours as needed for migraine. No more than 2 doses in 24 hours   senna 8.6 MG Tabs tablet Commonly known as: SENOKOT Take 2 tablets by mouth at bedtime.   traZODone  50 MG  tablet Commonly known as: DESYREL  Take 1 tablet (50 mg total) by mouth at bedtime as needed for sleep.   Trintellix  10 MG Tabs tablet Generic drug: vortioxetine  HBr Take 10 mg by mouth daily.   venlafaxine  XR 150 MG 24 hr capsule Commonly known as: EFFEXOR -XR Take 150 mg by mouth every morning.        Procedures/Studies: DG Chest Port 1 View Result Date: 05/10/2024 CLINICAL DATA:  Questionable sepsis EXAM: PORTABLE CHEST 1 VIEW COMPARISON:  Chest x-ray 04/26/2024.  Chest CT 04/24/2004. FINDINGS: The heart size and mediastinal contours are within normal limits. Both lungs are clear. The visualized skeletal structures are unremarkable. IMPRESSION: No active disease. Electronically Signed   By: Tyron Gallon M.D.   On: 05/10/2024 15:17   DG Swallowing Func-Speech Pathology Result Date: 04/29/2024 Table formatting from the original result was not included. Modified Barium Swallow Study Patient Details Name: TACIE MCCUISTION MRN: [id number] Date of Birth: 07-May-1941 Today's Date: 04/29/2024 HPI/PMH: HPI: ARLIN SAVONA is a 83 y.o. female with medical history significant for diabetes, hypertension, stroke, pituitary adenoma, PSVT, anxiety and depression.  Patient was brought to the ED from her group facility with reports of confusion that started today, also with cough, fever.   At the time of my evaluation, patient is lethargic, responds to direction but unable to answer questions or give history. Recent hospitalization 3/17 to 3/25 for acute/early subacute stroke with concomitant acute metabolic encephalopathy.  Was discharged to SNF.     Patient has been admitted with septic shock secondary to multifocal pneumonia along with associated acute hypoxemic respiratory  failure and acute metabolic encephalopathy. BSE requested. Clinical Impression: Patient presents with oropharyngeal swallowing to be essentially [facility]. Note one episode of flash frank penetration of thin liquids that was cleared during the swallow and occasional premature spillage of thin liquids penetrated before the swallow but was cleared during the swallow every time. Swallow is often triggered at the posterior angle of the ramus but sometimes is delayed until bolus fills the pyriform sinuses. Pharyngeal stripping wave is slightly diminished resulting in occasional trace pharyngeal residue. Note suspected small Zenker's Diverticulum (ZD) - no radiologist present to confirm. Residue in ZD with retrograde movement back up to the pyriforms. Repeat dry swallow clears retrograde residue from the pyriforms, however note it again collects in the ZD. No retrograde residue was penetrated or aspirated. Esophageal sweep was unremarkable. Recommend upgrade Pt's diet to D3/mech soft and continue with thin liquids. Recommend Pt utilize repeat swallow with every sip/bite to ensure trace/min retrograde residue is cleared from pharynx. Reviewed findings with patient and RN. There are no further ST needs noted at this time, our service will sign off. Factors that may increase risk of adverse event in presence of aspiration Roderick Civatte & Jessy Morocco 2021): No data recorded Recommendations/Plan: Swallowing Evaluation Recommendations Swallowing Evaluation Recommendations Recommendations: PO diet PO Diet Recommendation: Dysphagia 3 (Mechanical soft); Thin liquids (Level 0) Liquid Administration via: Cup; Straw Medication Administration: Whole meds with liquid Supervision: Patient able to self-feed Swallowing strategies  : Slow rate; Small bites/sips; Multiple dry swallows after each bite/sip Postural changes: Position pt fully upright for meals Oral care recommendations: Oral care BID (2x/day) Treatment Plan Treatment Plan Treatment  recommendations: Therapy as outlined in treatment plan below Follow-up recommendations: No SLP follow up Interventions: Aspiration precaution training Recommendations Recommendations for follow up therapy are one component of a multi-disciplinary discharge planning process, led by the attending physician.  Recommendations may be updated based on patient  status, additional functional criteria and insurance authorization. Assessment: Orofacial Exam: Orofacial Exam Oral Cavity: Oral Hygiene: WFL Oral Cavity - Dentition: Adequate natural dentition; Missing dentition Orofacial Anatomy: WFL Oral Motor/Sensory Function: WFL Anatomy: Anatomy: WFL Boluses Administered: Boluses Administered Boluses Administered: Thin liquids (Level 0); Mildly thick liquids (Level 2, nectar thick); Moderately thick liquids (Level 3, honey thick); Puree; Solid  Oral Impairment Domain: Oral Impairment Domain Lip Closure: No labial escape Tongue control during bolus hold: Not tested Bolus preparation/mastication: Timely and efficient chewing and mashing Bolus transport/lingual motion: Brisk tongue motion Oral residue: Complete oral clearance Initiation of pharyngeal swallow : Posterior angle of the ramus; Pyriform sinuses  Pharyngeal Impairment Domain: Pharyngeal Impairment Domain Soft palate elevation: No bolus between soft palate (SP)/pharyngeal wall (PW) Laryngeal elevation: Complete superior movement of thyroid  cartilage with complete approximation of arytenoids to epiglottic petiole Anterior hyoid excursion: Complete anterior movement Epiglottic movement: Complete inversion Laryngeal vestibule closure: Complete, no air/contrast in laryngeal vestibule; Incomplete, narrow column air/contrast in laryngeal vestibule Pharyngeal stripping wave : Present - complete Pharyngeal contraction (A/P view only): N/A Pharyngoesophageal segment opening: Complete distension and complete duration, no obstruction of flow Tongue base retraction: No contrast  between tongue base and posterior pharyngeal wall (PPW) Pharyngeal residue: Trace residue within or on pharyngeal structures Location of pharyngeal residue: Tongue base; Valleculae  Esophageal Impairment Domain: Esophageal Impairment Domain Esophageal clearance upright position: Esophageal retention with retrograde flow below pharyngoesophageal segment (PES) (small Zenker's Diverticulum) Pill: Pill Consistency administered: Thin liquids (Level 0) Thin liquids (Level 0): [facility] Penetration/Aspiration Scale Score: Penetration/Aspiration Scale Score 1.  Material does not enter airway: Thin liquids (Level 0); Mildly thick liquids (Level 2, nectar thick); Moderately thick liquids (Level 3, honey thick); Solid; Puree; Pill 2.  Material enters airway, remains ABOVE vocal cords then ejected out: Thin liquids (Level 0) Compensatory Strategies: Compensatory Strategies Compensatory strategies: Yes Multiple swallows: Effective   General Information: Caregiver present: No  Diet Prior to this Study: Dysphagia 2 (finely chopped); Thin liquids (Level 0)   Temperature : Normal   Respiratory Status: WFL   Supplemental O2: Nasal cannula   History of Recent Intubation: No  Behavior/Cognition: Alert; Cooperative; Pleasant mood; Confused Self-Feeding Abilities: Able to self-feed Baseline vocal quality/speech: Normal Volitional Cough: Able to elicit Volitional Swallow: Able to elicit No data recorded Goal Planning: No data recorded No data recorded No data recorded No data recorded No data recorded Pain: Pain Assessment Pain Assessment: No/denies pain End of Session: Start Time:SLP Start Time (ACUTE ONLY): 1121 Stop Time: SLP Stop Time (ACUTE ONLY): 1154 Time Calculation:SLP Time Calculation (min) (ACUTE ONLY): 33 min Charges: SLP Evaluations $ SLP Speech Visit: 1 Visit SLP Evaluations $MBS Swallow: 1 Procedure SLP visit diagnosis: SLP Visit Diagnosis: Dysphagia, unspecified (R13.10) Past Medical History: Past Medical History: Diagnosis Date   Anxiety   Asthma   ASYMPTOMATIC POSTMENOPAUSAL STATUS 02/09/2009  Cataract   Constipation 03/19/2013  Depression   DIABETES MELLITUS, TYPE II 09/14/2007  HYPERCHOLESTEROLEMIA 02/09/2009  HYPERTENSION 02/09/2009  HYPOTHYROIDISM 09/14/2007  MIGRAINE HEADACHE 09/14/2007  Neuropathy   OSTEOPOROSIS 02/09/2009  PANCREATITIS 09/14/2007  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  PONV (postoperative nausea and vomiting)   Rectocele 03/19/2013  Shortness of breath   SUPERFICIAL PHLEBITIS 09/14/2007  Varicose veins  Past Surgical History: Past Surgical History: Procedure Laterality Date  ABDOMINAL HYSTERECTOMY    ANTERIOR AND POSTERIOR REPAIR N/A 04/02/2013  Procedure: ANTERIOR (CYSTOCELE) AND POSTERIOR REPAIR (RECTOCELE);  Surgeon: Albino Hum, MD;  Location: [facility];  Service: Gynecology;  Laterality: N/A;  APPENDECTOMY    BRAIN SURGERY    CATARACT EXTRACTION W/PHACO  12/05/2011  Procedure: CATARACT EXTRACTION PHACO AND INTRAOCULAR LENS PLACEMENT (IOC);  Surgeon: Anner Kill;  Location: [facility];  Service: Ophthalmology;  Laterality: Left;  CDE:9.65  CHOLECYSTECTOMY    CRANIOTOMY N/A 06/04/2020  Procedure: Endoscopic Transphenoidal Resection of Recurrent PituitaryTumor;  Surgeon: Elna Haggis, MD;  Location: [facility] OR;  Service: Neurosurgery;  Laterality: N/A;  CYSTOSCOPY    ENDOVENOUS ABLATION SAPHENOUS VEIN W/ LASER  11-03-2011   right greater saphenous vein  left leg done 10-2011  EYE SURGERY  98  right cataract extraction 98  LAPAROSCOPIC NISSEN FUNDOPLICATION    NM ESOPHAGEAL REFLUX  08-11-11  PITUITARY EXCISION  10/1997  POSTERIOR REPAIR    TRANSNASAL APPROACH N/A 06/04/2020  Procedure: TRANSNASAL APPROACH;  Surgeon: Ammon Bales, MD;  Location: [facility] OR;  Service: ENT;  Laterality: N/A;  TRANSPHENOIDAL / TRANSNASAL HYPOPHYSECTOMY / RESECTION PITUITARY TUMOR  08-11-11 Florina Husbands 04/29/2024, 12:42 PM  ECHOCARDIOGRAM COMPLETE Result Date: 04/27/2024    ECHOCARDIOGRAM REPORT   Patient Name:   JAXYN MESTAS Date of Exam: 04/27/2024 Medical Rec  #:  [id number]      Height:       65.0 in Accession #:    [id number]     Weight:       125.4 lb Date of Birth:  1941/12/17       BSA:          1.622 m Patient Age:    82 years       BP:           91/54 mmHg Patient Gender: F              HR:           99 bpm. Exam Location:  [facility] Procedure: 2D Echo, Color Doppler and Cardiac Doppler (Both Spectral and Color            Flow Doppler were utilized during procedure). Indications:    I50.31 Acute diastolic (congestive) heart failure  History:        Patient has prior history of Echocardiogram examinations, most                 recent 03/13/2024. Risk Factors:Hypertension, Diabetes and                 Dyslipidemia.  Sonographer:    Sherline Distel Senior RDCS Referring Phys: [id number] PRATIK D [facility] IMPRESSIONS  1. Left ventricular ejection fraction, by estimation, is 60 to 65%. The left ventricle has normal function. The left ventricle has no regional wall motion abnormalities. Left ventricular diastolic parameters are indeterminate.  2. Right ventricular systolic function is normal. The right ventricular size is normal.  3. Left atrial size was mildly dilated.  4. MVA by PT1/2 1.23 cm2 with men gradient 12 peak 19 mmHg at HR of 101 bpm. The mitral valve is abnormal. Mild mitral valve regurgitation. Moderate mitral stenosis. Moderate mitral annular calcification.  5. The aortic valve is tricuspid. There is mild calcification of the aortic valve. There is mild thickening of the aortic valve. Aortic valve regurgitation is trivial. Aortic valve sclerosis is present, with no evidence of aortic valve stenosis.  6. The inferior vena cava is normal in size with greater than 50% respiratory variability, suggesting right atrial pressure of 3 mmHg. FINDINGS  Left Ventricle: Left ventricular ejection fraction, by estimation, is 60 to 65%. The left ventricle has normal function. The left ventricle  has no regional wall motion abnormalities. Strain was performed and the global longitudinal  strain is indeterminate. The left ventricular internal cavity size was normal in size. There is no left ventricular hypertrophy. Left ventricular diastolic parameters are indeterminate. Right Ventricle: The right ventricular size is normal. No increase in right ventricular wall thickness. Right ventricular systolic function is normal. Left Atrium: Left atrial size was mildly dilated. Right Atrium: Right atrial size was normal in size. Pericardium: There is no evidence of pericardial effusion. Mitral Valve: MVA by PT1/2 1.23 cm2 with men gradient 12 peak 19 mmHg at HR of 101 bpm. The mitral valve is abnormal. There is moderate thickening of the mitral valve leaflet(s). There is moderate calcification of the mitral valve leaflet(s). Moderate mitral annular calcification. Mild mitral valve regurgitation. Moderate mitral valve stenosis. MV peak gradient, 19.1 mmHg. The mean mitral valve gradient is 12.7 mmHg. Tricuspid Valve: The tricuspid valve is normal in structure. Tricuspid valve regurgitation is trivial. No evidence of tricuspid stenosis. Aortic Valve: The aortic valve is tricuspid. There is mild calcification of the aortic valve. There is mild thickening of the aortic valve. Aortic valve regurgitation is trivial. Aortic valve sclerosis is present, with no evidence of aortic valve stenosis. Pulmonic Valve: The pulmonic valve was normal in structure. Pulmonic valve regurgitation is not visualized. No evidence of pulmonic stenosis. Aorta: The aortic root is normal in size and structure. Venous: The inferior vena cava is normal in size with greater than 50% respiratory variability, suggesting right atrial pressure of 3 mmHg. IAS/Shunts: No atrial level shunt detected by color flow Doppler. Additional Comments: 3D was performed not requiring image post processing on an independent workstation and was indeterminate.  LEFT VENTRICLE PLAX 2D LVIDd:         3.70 cm LVIDs:         2.90 cm LV PW:         0.70 cm LV IVS:         0.80 cm LVOT diam:     1.80 cm LV SV:         55 LV SV Index:   34 LVOT Area:     2.54 cm  RIGHT VENTRICLE RV S prime:     13.80 cm/s TAPSE (M-mode): 1.8 cm LEFT ATRIUM             Index        RIGHT ATRIUM           Index LA diam:        3.80 cm 2.34 cm/m   RA Area:     13.30 cm LA Vol (A2C):   60.3 ml 37.17 ml/m  RA Volume:   28.90 ml  17.81 ml/m LA Vol (A4C):   45.0 ml 27.74 ml/m LA Biplane Vol: 55.6 ml 34.27 ml/m  AORTIC VALVE LVOT Vmax:   115.00 cm/s LVOT Vmean:  76.200 cm/s LVOT VTI:    0.216 m  AORTA Ao Root diam: 3.00 cm Ao Asc diam:  3.20 cm MITRAL VALVE MV Area VTI:  1.15 cm    SHUNTS MV Peak grad: 19.1 mmHg   Systemic VTI:  0.22 m MV Mean grad: 12.7 mmHg   Systemic Diam: 1.80 cm MV Vmax:      2.18 m/s MV Vmean:     172.3 cm/s Janelle Mediate MD Electronically signed by Janelle Mediate MD Signature Date/Time: 04/27/2024/2:17:05 PM    Final    DG CHEST PORT 1 VIEW Result Date: 04/26/2024 CLINICAL DATA:  Hypoxemia. EXAM: PORTABLE CHEST 1 VIEW COMPARISON:  Chest radiograph dated 04/24/2024. FINDINGS: Diffuse interstitial and interlobular septal prominence and edema. Small bilateral pleural effusions. No pneumothorax. The cardiac silhouette is within limits. Atherosclerotic calcification of the aorta. No acute osseous pathology. IMPRESSION: Pulmonary edema with small bilateral pleural effusions. Electronically Signed   By: Angus Bark M.D.   On: 04/26/2024 13:25   CT Head Wo Contrast Result Date: 04/24/2024 CLINICAL DATA:  ams EXAM: CT HEAD WITHOUT CONTRAST TECHNIQUE: Contiguous axial images were obtained from the base of the skull through the vertex without intravenous contrast. RADIATION DOSE REDUCTION: This exam was performed according to the departmental dose-optimization program which includes automated exposure control, adjustment of the mA and/or kV according to patient size and/or use of iterative reconstruction technique. COMPARISON:  CT scan head from 03/14/2024. FINDINGS: Brain: No  evidence of acute infarction, hemorrhage, hydrocephalus, extra-axial collection or mass lesion/mass effect. There is bilateral periventricular hypodensity, which is non-specific but most likely seen in the settings of microvascular ischemic changes. Moderate in extent. 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 Otherwise normal appearance of brain parenchyma. Redemonstration of patient's known sellar/suprasellar mass. No significant interval change. Ventricles are normal. Cerebral volume is age appropriate. Vascular: No hyperdense vessel or unexpected calcification. Intracranial arteriosclerosis. Skull: Normal. Negative for fracture or focal lesion. Sinuses/Orbits: No acute finding. Other: Visualized mastoid air cells are unremarkable. No mastoid effusion. IMPRESSION: *No acute intracranial abnormality. No significant interval change. Electronically Signed   By: Beula Brunswick M.D.   On: 04/24/2024 14:00   CT Chest Wo Contrast Result Date: 04/24/2024 CLINICAL DATA:  cf pna.  Increased altered mental status. EXAM: CT CHEST WITHOUT CONTRAST TECHNIQUE: Multidetector CT imaging of the chest was performed following the standard protocol without IV contrast. RADIATION DOSE REDUCTION: This exam was performed according to the departmental dose-optimization program which includes automated exposure control, adjustment of the mA and/or kV according to patient size and/or use of iterative reconstruction technique. COMPARISON:  CT scan chest from 12/25/2023. FINDINGS: Cardiovascular: Normal cardiac size. No pericardial effusion. No aortic aneurysm. There are coronary artery calcifications, in keeping with coronary artery disease. There are also mild peripheral atherosclerotic vascular calcifications of thoracic aorta and its major branches. Mediastinum/Nodes: Visualized thyroid  gland appears grossly unremarkable. No solid / cystic mediastinal masses. The esophagus is nondistended precluding optimal assessment.  There are few enlarged mediastinal lymph nodes, which appear grossly similar to the prior study and favored benign/reactive in the given clinical setting. No axillary lymphadenopathy by size criteria. Evaluation of bilateral hila is limited due to lack on intravenous contrast: however, no large hilar lymphadenopathy identified. Lungs/Pleura: The central tracheo-bronchial tree is patent. There are heterogeneous solid and ground-glass opacities throughout left lung and patchy opacities in the right lung lower lobe, compatible with multilobar pneumonia. Follow-up to clearing is recommended. There is no associated lung mass, pleural effusion or pneumothorax. No suspicious lung nodule. Upper Abdomen: Surgically absent gallbladder. Stable left adrenal adenomas. Remaining visualized upper abdominal viscera within normal limits. Musculoskeletal: The visualized soft tissues of the chest wall are grossly unremarkable. No suspicious osseous lesions. There are mild to moderate multilevel degenerative changes in the visualized spine. IMPRESSION: 1. There are heterogeneous, solid and ground-glass opacities throughout left lung and patchy opacities in the right lung lower lobe, compatible with multilobar pneumonia. Follow-up to clearing is recommended. 2. Multiple other nonacute observations, as described above. Aortic Atherosclerosis (ICD10-I70.0). Electronically Signed   By: Beula Brunswick M.D.   On: 04/24/2024 13:56  DG Chest Portable 1 View Result Date: 04/24/2024 CLINICAL DATA:  Fever and cough. EXAM: PORTABLE CHEST 1 VIEW COMPARISON:  Chest radiograph dated 03/11/2024. FINDINGS: Diffuse interstitial nodular density throughout the left lung concerning for pneumonia, possibly atypical in etiology. Aspiration is not excluded. No consolidative changes. There is no pleural effusion pneumothorax. The cardiac silhouette is within limits. Atherosclerotic calcification of the aorta. No acute osseous pathology. IMPRESSION: Diffuse  interstitial nodular density throughout the left lung concerning for pneumonia. Electronically Signed   By: Angus Bark M.D.   On: 04/24/2024 11:40     Subjective: Pt reports breathing better, she remains on room air oxygen.  Pt agreeable to return to [facility] where she says she has a lot of support.   Discharge Exam: Vitals:   05/15/24 0756 05/15/24 1241  BP:  101/66  Pulse:  82  Resp:  18  Temp:  98 F (36.7 C)  SpO2: 97% 99%   Vitals:   05/14/24 2017 05/15/24 0401 05/15/24 0756 05/15/24 1241  BP: 111/79 101/65  101/66  Pulse: 86 79  82  Resp: 18 16  18   Temp: 98.1 F (36.7 C) (!) 97.5 F (36.4 C)  98 F (36.7 C)  TempSrc: Oral Oral  Oral  SpO2: 95% 96% 97% 99%  Weight:      Height:       General: Pt is alert, awake, not in acute distress Cardiovascular: normal S1/S2 +, no rubs, no gallops Respiratory: CTA bilaterally, rare expiratory wheezing, no rhonchi, no increased work of breathing Abdominal: Soft, NT, ND, bowel sounds + Extremities: no edema, no cyanosis   The results of significant diagnostics from this hospitalization (including imaging, microbiology, ancillary and laboratory) are listed below for reference.     Microbiology: Recent Results (from the past 240 hours)  Urine Culture     Status: Abnormal   Collection Time: 05/10/24  2:33 PM   Specimen: Urine, Random  Result Value Ref Range Status   Specimen Description   Final    URINE, RANDOM Performed at [facility], [address] [place], [place] [address]    Special Requests   Final    NONE Reflexed from [id number] Performed at [facility], [address], [place], [place] [address]    Culture 80,000 COLONIES/mL ENTEROCOCCUS FAECALIS (A)  Final   Report Status 05/13/2024 FINAL  Final   Organism ID, Bacteria ENTEROCOCCUS FAECALIS (A)  Final      Susceptibility   Enterococcus faecalis - MIC*    AMPICILLIN <=2 SENSITIVE Sensitive     NITROFURANTOIN <=16 SENSITIVE Sensitive     VANCOMYCIN   >=32 RESISTANT Resistant     * 80,000 COLONIES/mL ENTEROCOCCUS FAECALIS  Resp panel by RT-PCR (RSV, Flu A&B, Covid) Anterior Nasal Swab     Status: None   Collection Time: 05/10/24  2:53 PM   Specimen: Anterior Nasal Swab  Result Value Ref Range Status   SARS Coronavirus 2 by RT PCR NEGATIVE NEGATIVE Final    Comment: (NOTE) SARS-CoV-2 target nucleic acids are NOT DETECTED.  The SARS-CoV-2 RNA is generally detectable in upper respiratory specimens during the acute phase of infection. The lowest concentration of SARS-CoV-2 viral copies this assay can detect is 138 copies/mL. A negative result does not preclude SARS-Cov-2 infection and should not be used as the sole basis for treatment or other patient management decisions. A negative result may occur with  improper specimen collection/handling, submission of specimen other than nasopharyngeal swab, presence of viral mutation(s) within  the areas targeted by this assay, and inadequate number of viral copies(<138 copies/mL). A negative result must be combined with clinical observations, patient history, and epidemiological information. The expected result is Negative.  Fact Sheet for Patients:  BloggerCourse.com  Fact Sheet for Healthcare Providers:  SeriousBroker.it  This test is no t yet approved or cleared by the [place]  FDA and  has been authorized for detection and/or diagnosis of SARS-CoV-2 by FDA under an Emergency Use Authorization (EUA). This EUA will remain  in effect (meaning this test can be used) for the duration of the COVID-19 declaration under Section 564(b)(1) of the Act, 21 U.S.C.section 360bbb-3(b)(1), unless the authorization is terminated  or revoked sooner.       Influenza A by PCR NEGATIVE NEGATIVE Final   Influenza B by PCR NEGATIVE NEGATIVE Final    Comment: (NOTE) The Xpert Xpress SARS-CoV-2/FLU/RSV plus assay is intended as an aid in the diagnosis  of influenza from Nasopharyngeal swab specimens and should not be used as a sole basis for treatment. Nasal washings and aspirates are unacceptable for Xpert Xpress SARS-CoV-2/FLU/RSV testing.  Fact Sheet for Patients: BloggerCourse.com  Fact Sheet for Healthcare Providers: SeriousBroker.it  This test is not yet approved or cleared by the [place]  FDA and has been authorized for detection and/or diagnosis of SARS-CoV-2 by FDA under an Emergency Use Authorization (EUA). This EUA will remain in effect (meaning this test can be used) for the duration of the COVID-19 declaration under Section 564(b)(1) of the Act, 21 U.S.C. section 360bbb-3(b)(1), unless the authorization is terminated or revoked.     Resp Syncytial Virus by PCR NEGATIVE NEGATIVE Final    Comment: (NOTE) Fact Sheet for Patients: BloggerCourse.com  Fact Sheet for Healthcare Providers: SeriousBroker.it  This test is not yet approved or cleared by the [place]  FDA and has been authorized for detection and/or diagnosis of SARS-CoV-2 by FDA under an Emergency Use Authorization (EUA). This EUA will remain in effect (meaning this test can be used) for the duration of the COVID-19 declaration under Section 564(b)(1) of the Act, 21 U.S.C. section 360bbb-3(b)(1), unless the authorization is terminated or revoked.  Performed at [facility], [address], [place], [place] [address]   Blood Culture (routine x 2)     Status: None   Collection Time: 05/10/24  3:00 PM   Specimen: BLOOD  Result Value Ref Range Status   Specimen Description BLOOD LEFT ANTECUBITAL  Final   Special Requests   Final    BOTTLES DRAWN AEROBIC AND ANAEROBIC Blood Culture adequate volume   Culture   Final    NO GROWTH 5 DAYS Performed at [facility], [address] [place], [place] [address]    Report Status 05/15/2024 FINAL  Final   Blood Culture (routine x 2)     Status: None   Collection Time: 05/10/24  3:00 PM   Specimen: BLOOD  Result Value Ref Range Status   Specimen Description BLOOD BLOOD LEFT HAND  Final   Special Requests   Final    AEROBIC BOTTLE ONLY Blood Culture results may not be optimal due to an inadequate volume of blood received in culture bottles   Culture   Final    NO GROWTH 5 DAYS Performed at [facility], [address] [place], [place] [address]    Report Status 05/15/2024 FINAL  Final  MRSA Next Gen by PCR, Nasal     Status: None   Collection Time: 05/11/24  8:30 AM   Specimen:  Nasal Mucosa; Nasal Swab  Result Value Ref Range Status   MRSA by PCR Next Gen NOT DETECTED NOT DETECTED Final    Comment: (NOTE) The GeneXpert MRSA Assay (FDA approved for NASAL specimens only), is one component of a comprehensive MRSA colonization surveillance program. It is not intended to diagnose MRSA infection nor to guide or monitor treatment for MRSA infections. Test performance is not FDA approved in patients less than 86 years old. Performed at [facility], [address], [place], [place] [address]      Labs: BNP (last 3 results) Recent Labs    12/25/23 2330 04/26/24 0425  BNP 475.0* 438.0*   Basic Metabolic Panel: Recent Labs  Lab 05/11/24 0445 05/12/24 0519 05/13/24 0511 05/14/24 0533 05/15/24 0426  NA 138 137 137 137 141  K 3.9 3.6 3.8 3.9 3.9  CL 106 108 109 109 108  CO2 25 25 26 26 24   GLUCOSE 124* 163* 91 221* 97  BUN 12 16 13 17 14   CREATININE 0.55 0.63 0.53 0.60 0.63  CALCIUM  8.0* 8.0* 7.8* 7.8* 8.3*  MG  --  1.6* 1.8 1.8 1.8   Liver Function Tests: Recent Labs  Lab 05/10/24 1500  AST 20  ALT 32  ALKPHOS 47  BILITOT 0.9  PROT 6.2*  ALBUMIN 3.0*   No results for input(s): "LIPASE", "AMYLASE" in the last 168 hours. No results for input(s): "AMMONIA" in the last 168 hours. CBC: Recent Labs  Lab 05/10/24 1500 05/11/24 0445 05/12/24 0519 05/13/24 0511  05/14/24 0533 05/15/24 0426  WBC 17.2* 14.7* 20.2* 16.4* 13.2* 15.3*  NEUTROABS 12.5*  --   --   --   --   --   HGB 12.8 11.7* 10.6* 10.4* 9.8* 10.4*  HCT 37.4 34.5* 31.8* 31.8* 31.2* 32.3*  MCV 88.4 90.3 91.1 91.4 91.5 90.0  PLT 263 238 237 210 205 199   Cardiac Enzymes: No results for input(s): "CKTOTAL", "CKMB", "CKMBINDEX", "TROPONINI" in the last 168 hours. BNP: Invalid input(s): "POCBNP" CBG: Recent Labs  Lab 05/14/24 1617 05/14/24 2017 05/14/24 2059 05/15/24 0719 05/15/24 1111  GLUCAP 295* 286* 283* 77 182*   D-Dimer No results for input(s): "DDIMER" in the last 72 hours. Hgb A1c No results for input(s): "HGBA1C" in the last 72 hours. Lipid Profile No results for input(s): "CHOL", "HDL", "LDLCALC", "TRIG", "CHOLHDL", "LDLDIRECT" in the last 72 hours. Thyroid  function studies No results for input(s): "TSH", "T4TOTAL", "T3FREE", "THYROIDAB" in the last 72 hours.  Invalid input(s): "FREET3" Anemia work up No results for input(s): "VITAMINB12", "FOLATE", "FERRITIN", "TIBC", "IRON", "RETICCTPCT" in the last 72 hours. Urinalysis    Component Value Date/Time   COLORURINE YELLOW 05/10/2024 1433   APPEARANCEUR CLOUDY (A) 05/10/2024 1433   LABSPEC 1.011 05/10/2024 1433   PHURINE 7.0 05/10/2024 1433   GLUCOSEU 150 (A) 05/10/2024 1433   GLUCOSEU >=1000 (A) 06/25/2020 1441   HGBUR NEGATIVE 05/10/2024 1433   BILIRUBINUR NEGATIVE 05/10/2024 1433   BILIRUBINUR neg 03/19/2013 1512   KETONESUR NEGATIVE 05/10/2024 1433   PROTEINUR NEGATIVE 05/10/2024 1433   UROBILINOGEN 0.2 06/25/2020 1441   NITRITE NEGATIVE 05/10/2024 1433   LEUKOCYTESUR LARGE (A) 05/10/2024 1433   Sepsis Labs Recent Labs  Lab 05/12/24 0519 05/13/24 0511 05/14/24 0533 05/15/24 0426  WBC 20.2* 16.4* 13.2* 15.3*   Microbiology Recent Results (from the past 240 hours)  Urine Culture     Status: Abnormal   Collection Time: 05/10/24  2:33 PM   Specimen: Urine, Random  Result Value Ref Range  Status    Specimen Description   Final    URINE, RANDOM Performed at [facility], [address] [place], [place] [address]    Special Requests   Final    NONE Reflexed from [id number] Performed at [facility], [address], [place], [place] [address]    Culture 80,000 COLONIES/mL ENTEROCOCCUS FAECALIS (A)  Final   Report Status 05/13/2024 FINAL  Final   Organism ID, Bacteria ENTEROCOCCUS FAECALIS (A)  Final      Susceptibility   Enterococcus faecalis - MIC*    AMPICILLIN <=2 SENSITIVE Sensitive     NITROFURANTOIN <=16 SENSITIVE Sensitive     VANCOMYCIN  >=32 RESISTANT Resistant     * 80,000 COLONIES/mL ENTEROCOCCUS FAECALIS  Resp panel by RT-PCR (RSV, Flu A&B, Covid) Anterior Nasal Swab     Status: None   Collection Time: 05/10/24  2:53 PM   Specimen: Anterior Nasal Swab  Result Value Ref Range Status   SARS Coronavirus 2 by RT PCR NEGATIVE NEGATIVE Final    Comment: (NOTE) SARS-CoV-2 target nucleic acids are NOT DETECTED.  The SARS-CoV-2 RNA is generally detectable in upper respiratory specimens during the acute phase of infection. The lowest concentration of SARS-CoV-2 viral copies this assay can detect is 138 copies/mL. A negative result does not preclude SARS-Cov-2 infection and should not be used as the sole basis for treatment or other patient management decisions. A negative result may occur with  improper specimen collection/handling, submission of specimen other than nasopharyngeal swab, presence of viral mutation(s) within the areas targeted by this assay, and inadequate number of viral copies(<138 copies/mL). A negative result must be combined with clinical observations, patient history, and epidemiological information. The expected result is Negative.  Fact Sheet for Patients:  BloggerCourse.com  Fact Sheet for Healthcare Providers:  SeriousBroker.it  This test is no t yet approved or cleared by the [place]  FDA and   has been authorized for detection and/or diagnosis of SARS-CoV-2 by FDA under an Emergency Use Authorization (EUA). This EUA will remain  in effect (meaning this test can be used) for the duration of the COVID-19 declaration under Section 564(b)(1) of the Act, 21 U.S.C.section 360bbb-3(b)(1), unless the authorization is terminated  or revoked sooner.       Influenza A by PCR NEGATIVE NEGATIVE Final   Influenza B by PCR NEGATIVE NEGATIVE Final    Comment: (NOTE) The Xpert Xpress SARS-CoV-2/FLU/RSV plus assay is intended as an aid in the diagnosis of influenza from Nasopharyngeal swab specimens and should not be used as a sole basis for treatment. Nasal washings and aspirates are unacceptable for Xpert Xpress SARS-CoV-2/FLU/RSV testing.  Fact Sheet for Patients: BloggerCourse.com  Fact Sheet for Healthcare Providers: SeriousBroker.it  This test is not yet approved or cleared by the [place]  FDA and has been authorized for detection and/or diagnosis of SARS-CoV-2 by FDA under an Emergency Use Authorization (EUA). This EUA will remain in effect (meaning this test can be used) for the duration of the COVID-19 declaration under Section 564(b)(1) of the Act, 21 U.S.C. section 360bbb-3(b)(1), unless the authorization is terminated or revoked.     Resp Syncytial Virus by PCR NEGATIVE NEGATIVE Final    Comment: (NOTE) Fact Sheet for Patients: BloggerCourse.com  Fact Sheet for Healthcare Providers: SeriousBroker.it  This test is not yet approved or cleared by the [place]  FDA and has been authorized for detection and/or diagnosis of SARS-CoV-2 by FDA under an Emergency Use Authorization (EUA). This EUA will remain in effect (meaning  this test can be used) for the duration of the COVID-19 declaration under Section 564(b)(1) of the Act, 21 U.S.C. section 360bbb-3(b)(1),  unless the authorization is terminated or revoked.  Performed at [facility], [address], [place], [place] [address]   Blood Culture (routine x 2)     Status: None   Collection Time: 05/10/24  3:00 PM   Specimen: BLOOD  Result Value Ref Range Status   Specimen Description BLOOD LEFT ANTECUBITAL  Final   Special Requests   Final    BOTTLES DRAWN AEROBIC AND ANAEROBIC Blood Culture adequate volume   Culture   Final    NO GROWTH 5 DAYS Performed at [facility], [address], [place], [place] [address]    Report Status 05/15/2024 FINAL  Final  Blood Culture (routine x 2)     Status: None   Collection Time: 05/10/24  3:00 PM   Specimen: BLOOD  Result Value Ref Range Status   Specimen Description BLOOD BLOOD LEFT HAND  Final   Special Requests   Final    AEROBIC BOTTLE ONLY Blood Culture results may not be optimal due to an inadequate volume of blood received in culture bottles   Culture   Final    NO GROWTH 5 DAYS Performed at [facility], [address] [place], [place] [address]    Report Status 05/15/2024 FINAL  Final  MRSA Next Gen by PCR, Nasal     Status: None   Collection Time: 05/11/24  8:30 AM   Specimen: Nasal Mucosa; Nasal Swab  Result Value Ref Range Status   MRSA by PCR Next Gen NOT DETECTED NOT DETECTED Final    Comment: (NOTE) The GeneXpert MRSA Assay (FDA approved for NASAL specimens only), is one component of a comprehensive MRSA colonization surveillance program. It is not intended to diagnose MRSA infection nor to guide or monitor treatment for MRSA infections. Test performance is not FDA approved in patients less than 70 years old. Performed at [facility], [address], [place], [place] [address]    Time coordinating discharge: 37 mins   SIGNED:  Faustino Hook, MD  [facility] 05/15/2024, 2:03 PM How to contact the [facility] Attending or Consulting provider 7A - 7P or covering provider during after hours 7P -7A, for this patient?  Check  the care team in [facility] and look for a) attending/consulting [facility] provider listed and b) the [facility] team listed Log into [email or web address] and use [facility]'s universal password to access. If you do not have the password, please contact the hospital operator. Locate the [facility] provider you are looking for under [facility] and page to a number that you can be directly reached. If you still have difficulty reaching the provider, please page the [facility] (Director on Call) for the Hospitalists listed on amion for assistance.

## 2024-05-15 NOTE — [place] FL2 (Signed)
 [place]  MEDICAID FL2 LEVEL OF CARE FORM     IDENTIFICATION  Patient Name: [name] White Birthdate: 1941/01/18 Sex: female Admission Date (Current Location): 05/10/2024  [facility] and [place] Number:  [employer] and Address:  [facility],  [address] [name] [address]      Provider Number: [id number]  Attending Physician Name and Address:  Rayfield Cairo, MD  Relative Name and Phone Number:  Carlen Chasten (Daughter) [phone]    Current Level of Care: Hospital Recommended Level of Care: Assisted Living Facility Prior Approval Number:    Date Approved/Denied:   PASRR Number:    Discharge Plan: Other (Comment) (ALF)    Current Diagnoses: Patient Active Problem List   Diagnosis Date Noted   Severe sepsis (HCC) 05/10/2024   Acute on chronic respiratory failure with hypoxia (HCC) 05/10/2024   Hypomagnesemia 05/01/2024   Septic shock (HCC) 04/24/2024   CAP (community acquired pneumonia) 04/24/2024   Acute respiratory failure with hypoxia (HCC) 04/24/2024   CVA (cerebral vascular accident) (HCC) 03/12/2024   Acute lower UTI 03/12/2024   Type 2 diabetes mellitus with peripheral neuropathy (HCC) 03/12/2024   Anxiety and depression 03/12/2024   GERD without esophagitis 03/12/2024   Acute encephalopathy 03/11/2024   Rhabdomyolysis 12/27/2023   Pressure injury of skin 12/27/2023   UTI (urinary tract infection) 12/26/2023   Sepsis due to urinary tract infection (HCC) 12/26/2023   Acute metabolic encephalopathy 12/26/2023   Status post transsphenoidal pituitary resection (HCC) 06/04/2020   Pituitary adenoma with extrasellar extension (HCC) 06/04/2020   Pituitary tumor 05/29/2020   Dizzinesses 02/02/2020   Acute pain of right shoulder 02/02/2020   Encounter for screening mammogram for malignant neoplasm of breast 01/28/2020   Vitamin D  deficiency 01/28/2020   Paroxysmal supraventricular tachycardia (HCC) 06/19/2014   Rectocele 03/19/2013    Constipation 03/19/2013   HYPERCHOLESTEROLEMIA 02/09/2009   Essential hypertension 02/09/2009   Osteoporosis 02/09/2009   Post-menopausal 02/09/2009   HEAT INTOLERANCE 02/01/2008   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   Hypothyroidism 09/14/2007   Diabetes (HCC) 09/14/2007   Migraine headache 09/14/2007   SUPERFICIAL PHLEBITIS 09/14/2007   PANCREATITIS 09/14/2007   MENOPAUSAL SYNDROME 09/14/2007   FATIGUE 09/14/2007    Orientation RESPIRATION BLADDER Height & Weight     Self, Situation, Time, Place  Normal Incontinent Weight: 129 lb 13.6 oz (58.9 kg) Height:  5\' 5"  (165.1 cm)  BEHAVIORAL SYMPTOMS/MOOD NEUROLOGICAL BOWEL NUTRITION STATUS      Continent Diet (Regular)  AMBULATORY STATUS COMMUNICATION OF NEEDS Skin   Limited Assist Verbally Normal                       Personal Care Assistance Level of Assistance  Bathing, Feeding, Dressing Bathing Assistance: Limited assistance Feeding assistance: Independent Dressing Assistance: Limited assistance     Functional Limitations Info  Sight, Hearing, Speech Sight Info: Impaired Hearing Info: Impaired Speech Info: Adequate    SPECIAL CARE FACTORS FREQUENCY  PT (By licensed PT)     PT Frequency: 2- 3 times a week with [facility]              Contractures Contractures Info: Not present    Additional Factors Info  Code Status, Allergies Code Status Info: DNR-Limited Allergies Info: Betadine and Penicillins           Current Medications (05/15/2024):  This is the current hospital active medication list Current Facility-Administered Medications  Medication Dose Route Frequency Provider Last Rate Last Admin  acetaminophen  (TYLENOL ) tablet 650 mg  650 mg Oral Q6H PRN Stinson, Jacob J, DO   650 mg at 05/11/24 2142   Or   acetaminophen  (TYLENOL ) suppository 650 mg  650 mg Rectal Q6H PRN Stinson, Jacob J, DO       ALPRAZolam  (XANAX ) tablet 0.25 mg  0.25 mg Oral TID PRN Mason Sole, Pratik D, DO   0.25 mg at  05/14/24 1120   aspirin  EC tablet 81 mg  81 mg Oral Daily Stinson, Jacob J, DO   81 mg at 05/15/24 1113   atorvastatin  (LIPITOR) tablet 40 mg  40 mg Oral Daily Stinson, Jacob J, DO   40 mg at 05/15/24 1112   baclofen  (LIORESAL ) tablet 10 mg  10 mg Oral QHS Stinson, Jacob J, DO   10 mg at 05/14/24 2213   clopidogrel  (PLAVIX ) tablet 75 mg  75 mg Oral Daily Stinson, Jacob J, DO   75 mg at 05/15/24 1113   cycloSPORINE  (RESTASIS ) 0.05 % ophthalmic emulsion 1 drop  1 drop Both Eyes BID Stinson, Jacob J, DO   1 drop at 05/15/24 1113   dextromethorphan -guaiFENesin  (MUCINEX  DM) 30-600 MG per 12 hr tablet 1 tablet  1 tablet Oral BID Mason Sole, Pratik D, DO   1 tablet at 05/15/24 1112   enoxaparin  (LOVENOX ) injection 40 mg  40 mg Subcutaneous Q24H Stinson, Jacob J, DO   40 mg at 05/14/24 2213   fluticasone  furoate-vilanterol (BREO ELLIPTA ) 200-25 MCG/ACT 1 puff  1 puff Inhalation Daily Stinson, Jacob J, DO   1 puff at 05/15/24 0755   insulin  aspart (novoLOG ) injection 0-20 Units  0-20 Units Subcutaneous TID WC Stinson, Jacob J, DO   11 Units at 05/14/24 1732   insulin  aspart (novoLOG ) injection 0-5 Units  0-5 Units Subcutaneous QHS Stinson, Jacob J, DO   3 Units at 05/14/24 2213   insulin  aspart (novoLOG ) injection 5 Units  5 Units Subcutaneous TID WC Shah, Pratik D, DO   5 Units at 05/14/24 1730   insulin  glargine-yfgn (SEMGLEE ) injection 10 Units  10 Units Subcutaneous QHS Shah, Pratik D, DO   10 Units at 05/14/24 2213   levalbuterol  (XOPENEX ) nebulizer solution 0.63 mg  0.63 mg Nebulization Q6H PRN Stinson, Jacob J, DO   0.63 mg at 05/12/24 0920   levothyroxine  (SYNTHROID ) tablet 50 mcg  50 mcg Oral Q0600 Stinson, Jacob J, DO   50 mcg at 05/15/24 0540   mirabegron  ER (MYRBETRIQ ) tablet 25 mg  25 mg Oral Daily Stinson, Jacob J, DO   25 mg at 05/15/24 1113   ondansetron  (ZOFRAN ) tablet 4 mg  4 mg Oral Q6H PRN Stinson, Jacob J, DO       Or   ondansetron  (ZOFRAN ) injection 4 mg  4 mg Intravenous Q6H PRN Stinson,  Jacob J, DO       pantoprazole  (PROTONIX ) EC tablet 40 mg  40 mg Oral Daily Stinson, Jacob J, DO   40 mg at 05/15/24 1112   predniSONE  (DELTASONE ) tablet 40 mg  40 mg Oral Q breakfast Shah, Pratik D, DO   40 mg at 05/15/24 1113   pregabalin  (LYRICA ) capsule 150 mg  150 mg Oral BID Stinson, Jacob J, DO   150 mg at 05/15/24 1128   senna (SENOKOT) tablet 17.2 mg  2 tablet Oral QHS Stinson, Jacob J, DO   17.2 mg at 05/14/24 2212   traZODone  (DESYREL ) tablet 50 mg  50 mg Oral QHS PRN Stinson, Jacob J, DO  venlafaxine  XR (EFFEXOR -XR) 24 hr capsule 150 mg  150 mg Oral BH-q7a Stinson, Jacob J, DO   150 mg at 05/15/24 0540   vortioxetine  HBr (TRINTELLIX ) tablet 10 mg  10 mg Oral Daily Stinson, Jacob J, DO   10 mg at 05/15/24 1128     Discharge Medications: Allergies as of 05/15/2024       Reactions   Betadine [povidone Iodine] Hives   Penicillins Rash        Medication List     STOP taking these medications    nitrofurantoin (macrocrystal-monohydrate) 100 MG capsule Commonly known as: MACROBID       TAKE these medications    acetaminophen  325 MG tablet Commonly known as: TYLENOL  Take 2 tablets (650 mg total) by mouth every 6 (six) hours as needed for mild pain (pain score 1-3) (or Fever >/= 101).   albuterol  108 (90 Base) MCG/ACT inhaler Commonly known as: VENTOLIN  HFA Inhale 2 puffs into the lungs every 4 (four) hours as needed for wheezing or shortness of breath.   aspirin  EC 81 MG tablet Take 81 mg by mouth daily. Swallow whole.   atorvastatin  40 MG tablet Commonly known as: LIPITOR Take 1 tablet (40 mg total) by mouth daily.   baclofen  10 MG tablet Commonly known as: LIORESAL  Take 1 tablet (10 mg total) by mouth at bedtime.   clopidogrel  75 MG tablet Commonly known as: PLAVIX  Take 1 tablet (75 mg total) by mouth daily.   cycloSPORINE  0.05 % ophthalmic emulsion Commonly known as: RESTASIS  Place 1 drop into both eyes 2 (two) times daily.   dextromethorphan  30  MG/5ML liquid Commonly known as: Delsym  Take 5 mLs (30 mg total) by mouth 2 (two) times daily as needed for cough.   dextromethorphan -guaiFENesin  30-600 MG 12hr tablet Commonly known as: MUCINEX  DM Take 1 tablet by mouth 2 (two) times daily for 7 days.   fluticasone  furoate-vilanterol 200-25 MCG/ACT Aepb Commonly known as: Breo Ellipta  Inhale 1 puff into the lungs daily.   furosemide  20 MG tablet Commonly known as: Lasix  Take 1 tablet (20 mg total) by mouth daily.   Gemtesa 75 MG Tabs Generic drug: Vibegron Take 75 mg by mouth at bedtime.   guaiFENesin  600 MG 12 hr tablet Commonly known as: MUCINEX  Take 600 mg by mouth 2 (two) times daily. X3 days   ipratropium-albuterol  0.5-2.5 (3) MG/3ML Soln Commonly known as: DUONEB Take 3 mLs by nebulization every 4 (four) hours as needed (wheezing, shortness of breath).   levothyroxine  50 MCG tablet Commonly known as: SYNTHROID  Take 50 mcg by mouth daily.   metFORMIN  500 MG tablet Commonly known as: GLUCOPHAGE  Take 500 mg by mouth 2 (two) times daily.   multivitamins ther. w/minerals Tabs tablet Take 1 tablet by mouth every morning.   omeprazole 20 MG capsule Commonly known as: PRILOSEC Take 20 mg by mouth every morning.   polyethylene glycol 17 g packet Commonly known as: MIRALAX  / GLYCOLAX  Take 17 g by mouth daily as needed for mild constipation.   potassium chloride  10 MEQ tablet Commonly known as: KLOR-CON  M Take 1 tablet (10 mEq total) by mouth daily.   predniSONE  20 MG tablet Commonly known as: DELTASONE  Take 2 tablets (40 mg total) by mouth daily with breakfast for 5 days. Start taking on: May 16, 2024 What changed: additional instructions   pregabalin  150 MG capsule Commonly known as: LYRICA  Take 1 capsule (150 mg total) by mouth 2 (two) times daily.   rizatriptan  10 MG tablet  Commonly known as: MAXALT  Take 10 mg by mouth every 2 (two) hours as needed for migraine. No more than 2 doses in 24 hours   senna  8.6 MG Tabs tablet Commonly known as: SENOKOT Take 2 tablets by mouth at bedtime.   traZODone  50 MG tablet Commonly known as: DESYREL  Take 1 tablet (50 mg total) by mouth at bedtime as needed for sleep.   Trintellix  10 MG Tabs tablet Generic drug: vortioxetine  HBr Take 10 mg by mouth daily.   venlafaxine  XR 150 MG 24 hr capsule Commonly known as: EFFEXOR -XR Take 150 mg by mouth every morning.         Relevant Imaging Results:  Relevant Lab Results:   Additional Information SSN: [id number], [place]

## 2024-05-15 NOTE — Discharge Instructions (Signed)
 IMPORTANT INFORMATION: PAY CLOSE ATTENTION   PHYSICIAN DISCHARGE INSTRUCTIONS  Follow with Primary care provider  Benita Stabile, MD  and other consultants as instructed by your Hospitalist Physician  SEEK MEDICAL CARE OR RETURN TO EMERGENCY ROOM IF SYMPTOMS COME BACK, WORSEN OR NEW PROBLEM DEVELOPS   Please note: You were cared for by a hospitalist during your hospital stay. Every effort will be made to forward records to your primary care provider.  You can request that your primary care provider send for your hospital records if they have not received them.  Once you are discharged, your primary care physician will handle any further medical issues. Please note that NO REFILLS for any discharge medications will be authorized once you are discharged, as it is imperative that you return to your primary care physician (or establish a relationship with a primary care physician if you do not have one) for your post hospital discharge needs so that they can reassess your need for medications and monitor your lab values.  Please get a complete blood count and chemistry panel checked by your Primary MD at your next visit, and again as instructed by your Primary MD.  Get Medicines reviewed and adjusted: Please take all your medications with you for your next visit with your Primary MD  Laboratory/radiological data: Please request your Primary MD to go over all hospital tests and procedure/radiological results at the follow up, please ask your primary care provider to get all Hospital records sent to his/her office.  In some cases, they will be blood work, cultures and biopsy results pending at the time of your discharge. Please request that your primary care provider follow up on these results.  If you are diabetic, please bring your blood sugar readings with you to your follow up appointment with primary care.    Please call and make your follow up appointments as soon as possible.    Also Note the  following: If you experience worsening of your admission symptoms, develop shortness of breath, life threatening emergency, suicidal or homicidal thoughts you must seek medical attention immediately by calling 911 or calling your MD immediately  if symptoms less severe.  You must read complete instructions/literature along with all the possible adverse reactions/side effects for all the Medicines you take and that have been prescribed to you. Take any new Medicines after you have completely understood and accpet all the possible adverse reactions/side effects.   Do not drive when taking Pain medications or sleeping medications (Benzodiazepines)  Do not take more than prescribed Pain, Sleep and Anxiety Medications. It is not advisable to combine anxiety,sleep and pain medications without talking with your primary care practitioner  Special Instructions: If you have smoked or chewed Tobacco  in the last 2 yrs please stop smoking, stop any regular Alcohol  and or any Recreational drug use.  Wear Seat belts while driving.  Do not drive if taking any narcotic, mind altering or controlled substances or recreational drugs or alcohol.

## 2024-05-16 DIAGNOSIS — I69398 Other sequelae of cerebral infarction: Secondary | ICD-10-CM | POA: Diagnosis not present

## 2024-05-16 DIAGNOSIS — G9349 Other encephalopathy: Secondary | ICD-10-CM | POA: Diagnosis not present

## 2024-05-16 DIAGNOSIS — L89152 Pressure ulcer of sacral region, stage 2: Secondary | ICD-10-CM | POA: Diagnosis not present

## 2024-05-16 DIAGNOSIS — E1142 Type 2 diabetes mellitus with diabetic polyneuropathy: Secondary | ICD-10-CM | POA: Diagnosis not present

## 2024-05-16 DIAGNOSIS — J45909 Unspecified asthma, uncomplicated: Secondary | ICD-10-CM | POA: Diagnosis not present

## 2024-05-17 DIAGNOSIS — I69398 Other sequelae of cerebral infarction: Secondary | ICD-10-CM | POA: Diagnosis not present

## 2024-05-17 DIAGNOSIS — E1142 Type 2 diabetes mellitus with diabetic polyneuropathy: Secondary | ICD-10-CM | POA: Diagnosis not present

## 2024-05-17 DIAGNOSIS — J45909 Unspecified asthma, uncomplicated: Secondary | ICD-10-CM | POA: Diagnosis not present

## 2024-05-17 DIAGNOSIS — L89152 Pressure ulcer of sacral region, stage 2: Secondary | ICD-10-CM | POA: Diagnosis not present

## 2024-05-17 DIAGNOSIS — G9349 Other encephalopathy: Secondary | ICD-10-CM | POA: Diagnosis not present

## 2024-05-21 DIAGNOSIS — J45909 Unspecified asthma, uncomplicated: Secondary | ICD-10-CM | POA: Diagnosis not present

## 2024-05-21 DIAGNOSIS — I69398 Other sequelae of cerebral infarction: Secondary | ICD-10-CM | POA: Diagnosis not present

## 2024-05-21 DIAGNOSIS — L89152 Pressure ulcer of sacral region, stage 2: Secondary | ICD-10-CM | POA: Diagnosis not present

## 2024-05-21 DIAGNOSIS — E1142 Type 2 diabetes mellitus with diabetic polyneuropathy: Secondary | ICD-10-CM | POA: Diagnosis not present

## 2024-05-21 DIAGNOSIS — G9349 Other encephalopathy: Secondary | ICD-10-CM | POA: Diagnosis not present

## 2024-05-22 ENCOUNTER — Other Ambulatory Visit: Payer: Self-pay

## 2024-05-22 ENCOUNTER — Encounter (HOSPITAL_COMMUNITY): Payer: Self-pay | Admitting: *Deleted

## 2024-05-22 ENCOUNTER — Inpatient Hospital Stay (HOSPITAL_COMMUNITY)

## 2024-05-22 ENCOUNTER — Inpatient Hospital Stay (HOSPITAL_COMMUNITY)
Admission: EM | Admit: 2024-05-22 | Discharge: 2024-05-28 | DRG: 871 | Disposition: A | Discharge status: Skilled Nursing Facility | Attending: Internal Medicine | Admitting: Internal Medicine

## 2024-05-22 ENCOUNTER — Emergency Department (HOSPITAL_COMMUNITY)

## 2024-05-22 DIAGNOSIS — Z880 Allergy status to penicillin: Secondary | ICD-10-CM

## 2024-05-22 DIAGNOSIS — A419 Sepsis, unspecified organism: Principal | ICD-10-CM | POA: Diagnosis present

## 2024-05-22 DIAGNOSIS — F419 Anxiety disorder, unspecified: Secondary | ICD-10-CM | POA: Diagnosis present

## 2024-05-22 DIAGNOSIS — R6520 Severe sepsis without septic shock: Secondary | ICD-10-CM | POA: Diagnosis not present

## 2024-05-22 DIAGNOSIS — J189 Pneumonia, unspecified organism: Secondary | ICD-10-CM | POA: Diagnosis present

## 2024-05-22 DIAGNOSIS — G8929 Other chronic pain: Secondary | ICD-10-CM | POA: Diagnosis not present

## 2024-05-22 DIAGNOSIS — E039 Hypothyroidism, unspecified: Secondary | ICD-10-CM | POA: Diagnosis present

## 2024-05-22 DIAGNOSIS — I10 Essential (primary) hypertension: Secondary | ICD-10-CM | POA: Diagnosis not present

## 2024-05-22 DIAGNOSIS — T380X5A Adverse effect of glucocorticoids and synthetic analogues, initial encounter: Secondary | ICD-10-CM | POA: Diagnosis not present

## 2024-05-22 DIAGNOSIS — E7800 Pure hypercholesterolemia, unspecified: Secondary | ICD-10-CM | POA: Diagnosis present

## 2024-05-22 DIAGNOSIS — Z66 Do not resuscitate: Secondary | ICD-10-CM | POA: Diagnosis present

## 2024-05-22 DIAGNOSIS — Z8249 Family history of ischemic heart disease and other diseases of the circulatory system: Secondary | ICD-10-CM

## 2024-05-22 DIAGNOSIS — J9601 Acute respiratory failure with hypoxia: Secondary | ICD-10-CM | POA: Diagnosis present

## 2024-05-22 DIAGNOSIS — Z7984 Long term (current) use of oral hypoglycemic drugs: Secondary | ICD-10-CM

## 2024-05-22 DIAGNOSIS — R918 Other nonspecific abnormal finding of lung field: Secondary | ICD-10-CM | POA: Diagnosis not present

## 2024-05-22 DIAGNOSIS — J440 Chronic obstructive pulmonary disease with acute lower respiratory infection: Secondary | ICD-10-CM | POA: Diagnosis not present

## 2024-05-22 DIAGNOSIS — E1142 Type 2 diabetes mellitus with diabetic polyneuropathy: Secondary | ICD-10-CM | POA: Diagnosis not present

## 2024-05-22 DIAGNOSIS — R509 Fever, unspecified: Secondary | ICD-10-CM | POA: Diagnosis not present

## 2024-05-22 DIAGNOSIS — Z7982 Long term (current) use of aspirin: Secondary | ICD-10-CM

## 2024-05-22 DIAGNOSIS — E1100 Type 2 diabetes mellitus with hyperosmolarity without nonketotic hyperglycemic-hyperosmolar coma (NKHHC): Secondary | ICD-10-CM | POA: Diagnosis not present

## 2024-05-22 DIAGNOSIS — E1165 Type 2 diabetes mellitus with hyperglycemia: Secondary | ICD-10-CM | POA: Diagnosis not present

## 2024-05-22 DIAGNOSIS — E860 Dehydration: Secondary | ICD-10-CM | POA: Diagnosis present

## 2024-05-22 DIAGNOSIS — Z794 Long term (current) use of insulin: Secondary | ICD-10-CM | POA: Diagnosis not present

## 2024-05-22 DIAGNOSIS — I771 Stricture of artery: Secondary | ICD-10-CM | POA: Diagnosis not present

## 2024-05-22 DIAGNOSIS — G9349 Other encephalopathy: Secondary | ICD-10-CM | POA: Diagnosis not present

## 2024-05-22 DIAGNOSIS — R059 Cough, unspecified: Secondary | ICD-10-CM | POA: Diagnosis not present

## 2024-05-22 DIAGNOSIS — A4181 Sepsis due to Enterococcus: Secondary | ICD-10-CM | POA: Diagnosis not present

## 2024-05-22 DIAGNOSIS — Z9841 Cataract extraction status, right eye: Secondary | ICD-10-CM

## 2024-05-22 DIAGNOSIS — I499 Cardiac arrhythmia, unspecified: Secondary | ICD-10-CM | POA: Diagnosis not present

## 2024-05-22 DIAGNOSIS — Z743 Need for continuous supervision: Secondary | ICD-10-CM | POA: Diagnosis not present

## 2024-05-22 DIAGNOSIS — Z79899 Other long term (current) drug therapy: Secondary | ICD-10-CM

## 2024-05-22 DIAGNOSIS — J441 Chronic obstructive pulmonary disease with (acute) exacerbation: Secondary | ICD-10-CM | POA: Diagnosis present

## 2024-05-22 DIAGNOSIS — Z1152 Encounter for screening for COVID-19: Secondary | ICD-10-CM

## 2024-05-22 DIAGNOSIS — L89152 Pressure ulcer of sacral region, stage 2: Secondary | ICD-10-CM | POA: Diagnosis not present

## 2024-05-22 DIAGNOSIS — B952 Enterococcus as the cause of diseases classified elsewhere: Secondary | ICD-10-CM | POA: Diagnosis present

## 2024-05-22 DIAGNOSIS — R739 Hyperglycemia, unspecified: Secondary | ICD-10-CM | POA: Diagnosis not present

## 2024-05-22 DIAGNOSIS — K219 Gastro-esophageal reflux disease without esophagitis: Secondary | ICD-10-CM | POA: Diagnosis present

## 2024-05-22 DIAGNOSIS — Z1621 Resistance to vancomycin: Secondary | ICD-10-CM | POA: Diagnosis present

## 2024-05-22 DIAGNOSIS — Z825 Family history of asthma and other chronic lower respiratory diseases: Secondary | ICD-10-CM

## 2024-05-22 DIAGNOSIS — N390 Urinary tract infection, site not specified: Secondary | ICD-10-CM | POA: Diagnosis not present

## 2024-05-22 DIAGNOSIS — Z8673 Personal history of transient ischemic attack (TIA), and cerebral infarction without residual deficits: Secondary | ICD-10-CM

## 2024-05-22 DIAGNOSIS — M810 Age-related osteoporosis without current pathological fracture: Secondary | ICD-10-CM | POA: Diagnosis present

## 2024-05-22 DIAGNOSIS — N3000 Acute cystitis without hematuria: Secondary | ICD-10-CM | POA: Diagnosis not present

## 2024-05-22 DIAGNOSIS — J90 Pleural effusion, not elsewhere classified: Secondary | ICD-10-CM | POA: Diagnosis not present

## 2024-05-22 DIAGNOSIS — J45909 Unspecified asthma, uncomplicated: Secondary | ICD-10-CM | POA: Diagnosis not present

## 2024-05-22 DIAGNOSIS — J9690 Respiratory failure, unspecified, unspecified whether with hypoxia or hypercapnia: Secondary | ICD-10-CM | POA: Diagnosis not present

## 2024-05-22 DIAGNOSIS — J690 Pneumonitis due to inhalation of food and vomit: Secondary | ICD-10-CM | POA: Diagnosis not present

## 2024-05-22 DIAGNOSIS — Z7902 Long term (current) use of antithrombotics/antiplatelets: Secondary | ICD-10-CM

## 2024-05-22 DIAGNOSIS — Z888 Allergy status to other drugs, medicaments and biological substances status: Secondary | ICD-10-CM

## 2024-05-22 DIAGNOSIS — I3481 Nonrheumatic mitral (valve) annulus calcification: Secondary | ICD-10-CM | POA: Diagnosis not present

## 2024-05-22 DIAGNOSIS — F32A Depression, unspecified: Secondary | ICD-10-CM | POA: Diagnosis present

## 2024-05-22 DIAGNOSIS — Z79890 Hormone replacement therapy (postmenopausal): Secondary | ICD-10-CM

## 2024-05-22 DIAGNOSIS — I69398 Other sequelae of cerebral infarction: Secondary | ICD-10-CM | POA: Diagnosis not present

## 2024-05-22 DIAGNOSIS — I700 Atherosclerosis of aorta: Secondary | ICD-10-CM | POA: Diagnosis not present

## 2024-05-22 DIAGNOSIS — Z823 Family history of stroke: Secondary | ICD-10-CM

## 2024-05-22 DIAGNOSIS — R0989 Other specified symptoms and signs involving the circulatory and respiratory systems: Secondary | ICD-10-CM | POA: Diagnosis not present

## 2024-05-22 LAB — URINALYSIS, W/ REFLEX TO CULTURE (INFECTION SUSPECTED)
Bacteria, UA: NONE SEEN
Bilirubin Urine: NEGATIVE
Glucose, UA: >=500 mg/dL — AB
Hgb urine dipstick: NEGATIVE
Ketones, ur: NEGATIVE mg/dL
Nitrite: NEGATIVE
Protein, ur: NEGATIVE mg/dL
Specific Gravity, Urine: 1.011 (ref 1.005–1.030)
pH: 7 (ref 5.0–8.0)

## 2024-05-22 LAB — BRAIN NATRIURETIC PEPTIDE: B Natriuretic Peptide: 239 pg/mL — ABNORMAL HIGH (ref 0.0–100.0)

## 2024-05-22 LAB — CBC WITH DIFFERENTIAL/PLATELET
Abs Immature Granulocytes: 0.24 10*3/uL — ABNORMAL HIGH (ref 0.00–0.07)
Basophils Absolute: 0.1 10*3/uL (ref 0.0–0.1)
Basophils Relative: 0 %
Eosinophils Absolute: 0.3 10*3/uL (ref 0.0–0.5)
Eosinophils Relative: 2 %
HCT: 37.6 % (ref 36.0–46.0)
Hemoglobin: 12.5 g/dL (ref 12.0–15.0)
Immature Granulocytes: 1 %
Lymphocytes Relative: 14 %
Lymphs Abs: 2.3 10*3/uL (ref 0.7–4.0)
MCH: 30 pg (ref 26.0–34.0)
MCHC: 33.2 g/dL (ref 30.0–36.0)
MCV: 90.4 fL (ref 80.0–100.0)
Monocytes Absolute: 0.7 10*3/uL (ref 0.1–1.0)
Monocytes Relative: 4 %
Neutro Abs: 13.5 10*3/uL — ABNORMAL HIGH (ref 1.7–7.7)
Neutrophils Relative %: 79 %
Platelets: 236 10*3/uL (ref 150–400)
RBC: 4.16 MIL/uL (ref 3.87–5.11)
RDW: 17.5 % — ABNORMAL HIGH (ref 11.5–15.5)
WBC: 17.1 10*3/uL — ABNORMAL HIGH (ref 4.0–10.5)
nRBC: 0 % (ref 0.0–0.2)

## 2024-05-22 LAB — COMPREHENSIVE METABOLIC PANEL WITH GFR
ALT: 33 U/L (ref 0–44)
AST: 16 U/L (ref 15–41)
Albumin: 2.9 g/dL — ABNORMAL LOW (ref 3.5–5.0)
Alkaline Phosphatase: 51 U/L (ref 38–126)
Anion gap: 13 (ref 5–15)
BUN: 16 mg/dL (ref 8–23)
CO2: 26 mmol/L (ref 22–32)
Calcium: 8.4 mg/dL — ABNORMAL LOW (ref 8.9–10.3)
Chloride: 91 mmol/L — ABNORMAL LOW (ref 98–111)
Creatinine, Ser: 0.82 mg/dL (ref 0.44–1.00)
GFR, Estimated: >60 mL/min (ref >60)
Glucose, Bld: 388 mg/dL — ABNORMAL HIGH (ref 70–99)
Potassium: 4.2 mmol/L (ref 3.5–5.1)
Sodium: 130 mmol/L — ABNORMAL LOW (ref 135–145)
Total Bilirubin: 1 mg/dL (ref 0.0–1.2)
Total Protein: 6.2 g/dL — ABNORMAL LOW (ref 6.5–8.1)

## 2024-05-22 LAB — PROCALCITONIN: Procalcitonin: <0.1 ng/mL

## 2024-05-22 LAB — MRSA NEXT GEN BY PCR, NASAL: MRSA by PCR Next Gen: NOT DETECTED

## 2024-05-22 LAB — RESP PANEL BY RT-PCR (RSV, FLU A&B, COVID)  RVPGX2
Influenza A by PCR: NEGATIVE
Influenza B by PCR: NEGATIVE
Resp Syncytial Virus by PCR: NEGATIVE
SARS Coronavirus 2 by RT PCR: NEGATIVE

## 2024-05-22 LAB — LACTIC ACID, PLASMA
Lactic Acid, Venous: 2.8 mmol/L (ref 0.5–1.9)
Lactic Acid, Venous: 2.5 mmol/L (ref 0.5–1.9)

## 2024-05-22 LAB — D-DIMER, QUANTITATIVE: D-Dimer, Quant: 1.02 ug{FEU}/mL — ABNORMAL HIGH (ref 0.00–0.50)

## 2024-05-22 MED ORDER — IOHEXOL 350 MG/ML SOLN
75.0000 mL | Freq: Once | INTRAVENOUS | Status: AC | PRN
  Administered 2024-05-22: 75 mL via INTRAVENOUS

## 2024-05-22 MED ORDER — LACTATED RINGERS IV SOLN: INTRAVENOUS | Status: AC

## 2024-05-22 MED ORDER — TRAZODONE HCL 50 MG PO TABS: 50.0000 mg | ORAL_TABLET | Freq: Every evening | ORAL | Status: DC | PRN

## 2024-05-22 MED ORDER — MIRABEGRON ER 25 MG PO TB24
25.0000 mg | ORAL_TABLET | Freq: Every day | ORAL | Status: DC
  Administered 2024-05-22 – 2024-05-28 (×7): 25 mg via ORAL
  Filled 2024-05-22 (×7): qty 1

## 2024-05-22 MED ORDER — ENOXAPARIN SODIUM 40 MG/0.4ML IJ SOSY
40.0000 mg | PREFILLED_SYRINGE | INTRAMUSCULAR | Status: DC
  Administered 2024-05-22 – 2024-05-27 (×6): 40 mg via SUBCUTANEOUS
  Filled 2024-05-22 (×6): qty 0.4

## 2024-05-22 MED ORDER — ACETAMINOPHEN 325 MG PO TABS: 650.0000 mg | ORAL_TABLET | Freq: Four times a day (QID) | ORAL | Status: DC | PRN

## 2024-05-22 MED ORDER — BUDESONIDE 0.5 MG/2ML IN SUSP
0.5000 mg | Freq: Two times a day (BID) | RESPIRATORY_TRACT | Status: DC
  Administered 2024-05-22 – 2024-05-28 (×12): 0.5 mg via RESPIRATORY_TRACT
  Filled 2024-05-22 (×12): qty 2

## 2024-05-22 MED ORDER — PREGABALIN 75 MG PO CAPS
150.0000 mg | ORAL_CAPSULE | Freq: Two times a day (BID) | ORAL | Status: DC
  Administered 2024-05-22 – 2024-05-28 (×12): 150 mg via ORAL
  Filled 2024-05-22 (×12): qty 2

## 2024-05-22 MED ORDER — VORTIOXETINE HBR 10 MG PO TABS: 10.0000 mg | ORAL_TABLET | Freq: Every day | ORAL | Status: DC

## 2024-05-22 MED ORDER — CYCLOSPORINE 0.05 % OP EMUL
1.0000 [drp] | Freq: Two times a day (BID) | OPHTHALMIC | Status: DC
  Administered 2024-05-22 – 2024-05-28 (×12): 1 [drp] via OPHTHALMIC
  Filled 2024-05-22 (×12): qty 30

## 2024-05-22 MED ORDER — ONDANSETRON HCL 4 MG/2ML IJ SOLN: 4.0000 mg | Freq: Four times a day (QID) | INTRAMUSCULAR | Status: DC | PRN

## 2024-05-22 MED ORDER — FUROSEMIDE 20 MG PO TABS
20.0000 mg | ORAL_TABLET | Freq: Every day | ORAL | Status: DC
  Administered 2024-05-22 – 2024-05-23 (×2): 20 mg via ORAL
  Filled 2024-05-22 (×2): qty 1

## 2024-05-22 MED ORDER — CLOPIDOGREL BISULFATE 75 MG PO TABS
75.0000 mg | ORAL_TABLET | Freq: Every day | ORAL | Status: DC
  Administered 2024-05-22 – 2024-05-28 (×7): 75 mg via ORAL
  Filled 2024-05-22 (×7): qty 1

## 2024-05-22 MED ORDER — ACETAMINOPHEN 650 MG RE SUPP: 650.0000 mg | Freq: Four times a day (QID) | RECTAL | Status: DC | PRN

## 2024-05-22 MED ORDER — ARFORMOTEROL TARTRATE 15 MCG/2ML IN NEBU
15.0000 ug | INHALATION_SOLUTION | Freq: Two times a day (BID) | RESPIRATORY_TRACT | Status: DC
  Administered 2024-05-22 – 2024-05-28 (×12): 15 ug via RESPIRATORY_TRACT
  Filled 2024-05-22 (×12): qty 2

## 2024-05-22 MED ORDER — CHLORHEXIDINE GLUCONATE CLOTH 2 % EX PADS
6.0000 | MEDICATED_PAD | Freq: Every day | CUTANEOUS | Status: DC
  Administered 2024-05-23 – 2024-05-28 (×6): 6 via TOPICAL

## 2024-05-22 MED ORDER — AZITHROMYCIN 250 MG PO TABS
500.0000 mg | ORAL_TABLET | Freq: Every day | ORAL | Status: DC
  Administered 2024-05-23 – 2024-05-28 (×6): 500 mg via ORAL
  Filled 2024-05-22 (×7): qty 2

## 2024-05-22 MED ORDER — METHYLPREDNISOLONE SODIUM SUCC 125 MG IJ SOLR
125.0000 mg | Freq: Once | INTRAMUSCULAR | Status: AC
  Administered 2024-05-22: 125 mg via INTRAVENOUS
  Filled 2024-05-22: qty 2

## 2024-05-22 MED ORDER — POLYETHYLENE GLYCOL 3350 17 G PO PACK: 17.0000 g | PACK | Freq: Every day | ORAL | Status: DC | PRN

## 2024-05-22 MED ORDER — SODIUM CHLORIDE 0.9 % IV SOLN
2.0000 g | Freq: Two times a day (BID) | INTRAVENOUS | Status: DC
  Administered 2024-05-22 – 2024-05-27 (×11): 2 g via INTRAVENOUS
  Filled 2024-05-22 (×12): qty 12.5

## 2024-05-22 MED ORDER — SENNA 8.6 MG PO TABS
2.0000 | ORAL_TABLET | Freq: Every day | ORAL | Status: DC
  Administered 2024-05-22 – 2024-05-27 (×6): 17.2 mg via ORAL
  Filled 2024-05-22 (×6): qty 2

## 2024-05-22 MED ORDER — ASPIRIN 81 MG PO TBEC
81.0000 mg | DELAYED_RELEASE_TABLET | Freq: Every day | ORAL | Status: DC
  Administered 2024-05-22 – 2024-05-28 (×7): 81 mg via ORAL
  Filled 2024-05-22 (×7): qty 1

## 2024-05-22 MED ORDER — SODIUM CHLORIDE 0.9 % IV SOLN
2.0000 g | Freq: Once | INTRAVENOUS | Status: AC
  Administered 2024-05-22: 2 g via INTRAVENOUS
  Filled 2024-05-22: qty 12.5

## 2024-05-22 MED ORDER — ONDANSETRON HCL 4 MG PO TABS: 4.0000 mg | ORAL_TABLET | Freq: Four times a day (QID) | ORAL | Status: DC | PRN

## 2024-05-22 MED ORDER — SODIUM CHLORIDE 0.9 % IV SOLN
250.0000 mL | INTRAVENOUS | Status: AC
  Administered 2024-05-22: 250 mL via INTRAVENOUS

## 2024-05-22 MED ORDER — LEVOTHYROXINE SODIUM 50 MCG PO TABS
50.0000 ug | ORAL_TABLET | Freq: Every day | ORAL | Status: DC
  Administered 2024-05-22 – 2024-05-28 (×7): 50 ug via ORAL
  Filled 2024-05-22 (×4): qty 2
  Filled 2024-05-22: qty 1
  Filled 2024-05-22 (×2): qty 2

## 2024-05-22 MED ORDER — NOREPINEPHRINE 4 MG/250ML-% IV SOLN
0.0000 ug/min | INTRAVENOUS | Status: DC
  Administered 2024-05-22: 2 ug/min via INTRAVENOUS
  Administered 2024-05-24: 3 ug/min via INTRAVENOUS
  Administered 2024-05-25: 1 ug/min via INTRAVENOUS
  Filled 2024-05-22 (×3): qty 250

## 2024-05-22 MED ORDER — BUDESONIDE 0.5 MG/2ML IN SUSP: 0.5000 mg | Freq: Two times a day (BID) | RESPIRATORY_TRACT | Status: DC

## 2024-05-22 MED ORDER — METHYLPREDNISOLONE SODIUM SUCC 125 MG IJ SOLR
60.0000 mg | Freq: Two times a day (BID) | INTRAMUSCULAR | Status: DC
  Administered 2024-05-23: 60 mg via INTRAVENOUS
  Filled 2024-05-22: qty 2

## 2024-05-22 MED ORDER — LINEZOLID 600 MG/300ML IV SOLN: 600.0000 mg | Freq: Two times a day (BID) | INTRAVENOUS | Status: DC

## 2024-05-22 MED ORDER — LINEZOLID 600 MG/300ML IV SOLN
600.0000 mg | Freq: Two times a day (BID) | INTRAVENOUS | Status: DC
  Administered 2024-05-22 – 2024-05-27 (×12): 600 mg via INTRAVENOUS
  Filled 2024-05-22 (×15): qty 300

## 2024-05-22 MED ORDER — SODIUM CHLORIDE 0.9 % IV BOLUS
30.0000 mL/kg | Freq: Once | INTRAVENOUS | Status: AC
Start: 2024-05-22 — End: 2024-05-22
  Administered 2024-05-22: 1767 mL via INTRAVENOUS

## 2024-05-22 MED ORDER — ATORVASTATIN CALCIUM 40 MG PO TABS
40.0000 mg | ORAL_TABLET | Freq: Every day | ORAL | Status: DC
  Administered 2024-05-22 – 2024-05-28 (×7): 40 mg via ORAL
  Filled 2024-05-22 (×7): qty 1

## 2024-05-22 MED ORDER — PANTOPRAZOLE SODIUM 40 MG PO TBEC
40.0000 mg | DELAYED_RELEASE_TABLET | Freq: Every day | ORAL | Status: DC
  Administered 2024-05-22 – 2024-05-28 (×7): 40 mg via ORAL
  Filled 2024-05-22 (×7): qty 1

## 2024-05-22 MED ORDER — IPRATROPIUM-ALBUTEROL 0.5-2.5 (3) MG/3ML IN SOLN
3.0000 mL | Freq: Four times a day (QID) | RESPIRATORY_TRACT | Status: DC
  Administered 2024-05-22 – 2024-05-23 (×5): 3 mL via RESPIRATORY_TRACT
  Filled 2024-05-22 (×5): qty 3

## 2024-05-22 MED ORDER — SODIUM CHLORIDE 0.9 % IV SOLN
500.0000 mg | INTRAVENOUS | Status: DC
  Administered 2024-05-22: 500 mg via INTRAVENOUS
  Filled 2024-05-22: qty 5

## 2024-05-22 MED ORDER — VENLAFAXINE HCL ER 75 MG PO CP24: 150.0000 mg | ORAL_CAPSULE | ORAL | Status: DC

## 2024-05-22 NOTE — ED Notes (Signed)
 O2 sats 87-89% on r/a while sleeping, placed on O2 @ 2liters via nasal cannula

## 2024-05-22 NOTE — ED Notes (Signed)
 Report given to erica rn in ICU

## 2024-05-22 NOTE — Hospital Course (Signed)
 83 year old female with a history of diabetes mellitus type 2, stroke, hypertension, COPD, hypothyroidism, hyperlipidemia, anxiety, PSVT presenting with coughing, chest congestion, shortness of breath.  The patient denies any fevers, chills, chest pain, nausea, vomiting or diarrhea.  She denies any hemoptysis.  She was recently admitted to the hospital from 05/10/2024 to 05/15/2024.  During that hospitalization, the patient was treated for COPD exacerbation and what was felt to be atypical pneumonia.  She was discharged with a prednisone  taper.  In addition, the patient was also admitted from 04/24/2024 to 05/01/2024 with septic shock secondary pneumonia. She has had increasing generalized weakness since discharge from the hospital.  There is no hematochezia or melena.  She does complain of some dysuria for the past week. In the ED, the patient was afebrile with soft blood pressures in the low 90s and upper 80s.  WBC 17.1, hemoglobin 12.5, platelet 236.  Sodium 130, potassium 4.2, bicarbonate 26, serum creatinine 0.82.  LFTs were unremarkable.  Chest x-ray showed chronic interstitial markings.  Lactic acid 2.8.  The patient was started on linezolid and cefepime .

## 2024-05-22 NOTE — ED Triage Notes (Signed)
 Pt BIB [facility] from [facility] for c/o weakness  Pt was recently discharged from hospital for pneumonia and just finished her antibiotics today  Staff reported to EMS that pt is unable to walk today  Pt's mouth appears dry and pt has a non-productive cough  EDP at bedside

## 2024-05-22 NOTE — Sepsis Progress Note (Signed)
 Code Sepsis protocol being monitored by eLink.

## 2024-05-22 NOTE — H&P (Signed)
 History and Physical    Patient: [name] White:[id number] DOB: 21-Aug-1941 DOA: 05/22/2024 DOS: the patient was seen and examined on 05/22/2024 PCP: Omie Bickers, MD  Patient coming from: Home  Chief Complaint:  Chief Complaint  Patient presents with   Weakness   HPI: [name] White is a 83 year old female with a history of diabetes mellitus type 2, stroke, hypertension, COPD, hypothyroidism, hyperlipidemia, anxiety, PSVT presenting with coughing, chest congestion, shortness of breath.  The patient denies any fevers, chills, chest pain, nausea, vomiting or diarrhea.  She denies any hemoptysis.  She was recently admitted to the hospital from 05/10/2024 to 05/15/2024.  During that hospitalization, the patient was treated for COPD exacerbation and what was felt to be atypical pneumonia.  She was discharged with a prednisone  taper.  In addition, the patient was also admitted from 04/24/2024 to 05/01/2024 with septic shock secondary pneumonia. She has had increasing generalized weakness since discharge from the hospital.  There is no hematochezia or melena.  She does complain of some dysuria for the past week. In the ED, the patient was afebrile with soft blood pressures in the low 90s and upper 80s.  WBC 17.1, hemoglobin 12.5, platelet 236.  Sodium 130, potassium 4.2, bicarbonate 26, serum creatinine 0.82.  LFTs were unremarkable.  Chest x-ray showed chronic interstitial markings.  Lactic acid 2.8.  The patient was started on linezolid and cefepime .  Review of Systems: As mentioned in the history of present illness. All other systems reviewed and are negative. Past Medical History:  Diagnosis Date   Anxiety    Asthma    ASYMPTOMATIC POSTMENOPAUSAL STATUS 02/09/2009   Cataract    Constipation 03/19/2013   Depression    DIABETES MELLITUS, TYPE II 09/14/2007   HYPERCHOLESTEROLEMIA 02/09/2009   HYPERTENSION 02/09/2009   HYPOTHYROIDISM 09/14/2007   MIGRAINE HEADACHE 09/14/2007   Neuropathy     OSTEOPOROSIS 02/09/2009   PANCREATITIS 09/14/2007   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   PONV (postoperative nausea and vomiting)    Rectocele 03/19/2013   Shortness of breath    SUPERFICIAL PHLEBITIS 09/14/2007   Varicose veins    Past Surgical History:  Procedure Laterality Date   ABDOMINAL HYSTERECTOMY     ANTERIOR AND POSTERIOR REPAIR N/A 04/02/2013   Procedure: ANTERIOR (CYSTOCELE) AND POSTERIOR REPAIR (RECTOCELE);  Surgeon: Albino Hum, MD;  Location: [facility];  Service: Gynecology;  Laterality: N/A;   APPENDECTOMY     BRAIN SURGERY     CATARACT EXTRACTION W/PHACO  12/05/2011   Procedure: CATARACT EXTRACTION PHACO AND INTRAOCULAR LENS PLACEMENT (IOC);  Surgeon: Anner Kill;  Location: [facility];  Service: Ophthalmology;  Laterality: Left;  CDE:9.65   CHOLECYSTECTOMY     CRANIOTOMY N/A 06/04/2020   Procedure: Endoscopic Transphenoidal Resection of Recurrent PituitaryTumor;  Surgeon: Elna Haggis, MD;  Location: [facility] OR;  Service: Neurosurgery;  Laterality: N/A;   CYSTOSCOPY     ENDOVENOUS ABLATION SAPHENOUS VEIN W/ LASER  11-03-2011   right greater saphenous vein   left leg done 10-2011   EYE SURGERY  98   right cataract extraction 98   LAPAROSCOPIC NISSEN FUNDOPLICATION     NM ESOPHAGEAL REFLUX  08-11-11   PITUITARY EXCISION  10/1997   POSTERIOR REPAIR     TRANSNASAL APPROACH N/A 06/04/2020   Procedure: TRANSNASAL APPROACH;  Surgeon: Ammon Bales, MD;  Location: [facility] OR;  Service: ENT;  Laterality: N/A;   TRANSPHENOIDAL / TRANSNASAL HYPOPHYSECTOMY / RESECTION PITUITARY TUMOR  08-11-11   Social History:  reports that  she has never smoked. She has never used smokeless tobacco. She reports that she does not drink alcohol and does not use drugs.  Allergies  Allergen Reactions   Betadine [Povidone Iodine] Hives   Penicillins Rash    Family History  Problem Relation Age of Onset   Heart disease Mother    Asthma Mother    COPD Father    Heart disease Father    Stroke Father    Asthma  Daughter    Migraines Daughter    Neuropathy Son    Cancer Neg Hx    Anesthesia problems Neg Hx    Hypotension Neg Hx    Malignant hyperthermia Neg Hx    Pseudochol deficiency Neg Hx     Prior to Admission medications   Medication Sig Start Date End Date Taking? Authorizing Provider  acetaminophen  (TYLENOL ) 325 MG tablet Take 2 tablets (650 mg total) by mouth every 6 (six) hours as needed for mild pain (pain score 1-3) (or Fever >/= 101). 01/01/24   Shahmehdi, Constantino Demark, MD  albuterol  (VENTOLIN  HFA) 108 (90 Base) MCG/ACT inhaler Inhale 2 puffs into the lungs every 4 (four) hours as needed for wheezing or shortness of breath. 05/01/24   Johnson, Clanford L, MD  aspirin  EC 81 MG tablet Take 81 mg by mouth daily. Swallow whole.    [provider]  atorvastatin  (LIPITOR) 40 MG tablet Take 1 tablet (40 mg total) by mouth daily. 03/20/24   Colin Dawley, MD  baclofen  (LIORESAL ) 10 MG tablet Take 1 tablet (10 mg total) by mouth at bedtime. 05/14/24   Mason Sole, Pratik D, DO  clopidogrel  (PLAVIX ) 75 MG tablet Take 1 tablet (75 mg total) by mouth daily. 05/15/24   Johnson, Clanford L, MD  cycloSPORINE  (RESTASIS ) 0.05 % ophthalmic emulsion Place 1 drop into both eyes 2 (two) times daily.    [provider]  dextromethorphan  (DELSYM ) 30 MG/5ML liquid Take 5 mLs (30 mg total) by mouth 2 (two) times daily as needed for cough. 05/01/24   Johnson, Clanford L, MD  fluticasone  furoate-vilanterol (BREO ELLIPTA ) 200-25 MCG/ACT AEPB Inhale 1 puff into the lungs daily. 03/19/24   Colin Dawley, MD  furosemide  (LASIX ) 20 MG tablet Take 1 tablet (20 mg total) by mouth daily. 05/03/24 05/03/25  Johnson, Clanford L, MD  ipratropium-albuterol  (DUONEB) 0.5-2.5 (3) MG/3ML SOLN Take 3 mLs by nebulization every 4 (four) hours as needed (wheezing, shortness of breath). 05/01/24   Johnson, Clanford L, MD  levothyroxine  (SYNTHROID ) 50 MCG tablet Take 50 mcg by mouth daily.    [provider]  metFORMIN  (GLUCOPHAGE )  500 MG tablet Take 500 mg by mouth 2 (two) times daily. 04/16/24   [provider]  Multiple Vitamins-Minerals (MULTIVITAMINS THER. W/MINERALS) TABS Take 1 tablet by mouth every morning.    [provider]  omeprazole (PRILOSEC) 20 MG capsule Take 20 mg by mouth every morning.    [provider]  polyethylene glycol (MIRALAX  / GLYCOLAX ) 17 g packet Take 17 g by mouth daily as needed for mild constipation. 05/01/24   Johnson, Clanford L, MD  potassium chloride  (KLOR-CON  M) 10 MEQ tablet Take 1 tablet (10 mEq total) by mouth daily. 05/03/24   Johnson, Clanford L, MD  pregabalin  (LYRICA ) 150 MG capsule Take 1 capsule (150 mg total) by mouth 2 (two) times daily. 03/19/24   Colin Dawley, MD  rizatriptan  (MAXALT ) 10 MG tablet Take 10 mg by mouth every 2 (two) hours as needed for migraine. No more than  2 doses in 24 hours 04/16/24   [provider]  senna (SENOKOT) 8.6 MG TABS tablet Take 2 tablets by mouth at bedtime.    [provider]  traZODone  (DESYREL ) 50 MG tablet Take 1 tablet (50 mg total) by mouth at bedtime as needed for sleep. 03/19/24   Colin Dawley, MD  TRINTELLIX  10 MG TABS tablet Take 10 mg by mouth daily. 03/09/24   [provider]  venlafaxine  (EFFEXOR -XR) 150 MG 24 hr capsule Take 150 mg by mouth every morning.    [provider]  Vibegron (GEMTESA) 75 MG TABS Take 75 mg by mouth at bedtime.    [provider]    Physical Exam: Vitals:   05/22/24 1315 05/22/24 1320 05/22/24 1325 05/22/24 1330  BP: (!) 89/58   (!) 87/57  Pulse: 94  96 96  Resp: (!) 26 17    Temp:      TempSrc:      SpO2: (!) 87%  98% 96%  Weight:      Height:       GENERAL:  A&O x 3, NAD, well developed, cooperative, follows commands HEENT: [place]/AT, No thrush, No icterus, No oral ulcers Neck:  No neck mass, No meningismus, soft, supple CV: RRR, no S3, no S4, no rub, no JVD Lungs: Bilateral rales.  Bilateral expiratory wheeze. Abd: soft/NT  +BS, nondistended Ext: No edema, no lymphangitis, no cyanosis, no rashes Neuro:  CN II-XII intact, strength 4/5 in RUE, RLE, strength 4/5 LUE, LLE; sensation intact bilateral; no dysmetria; babinski equivocal  Data Reviewed: Data reviewed above in the history  Assessment and Plan: Severe sepsis - Presented with leukocytosis, lactic acid 2.8, low BPs - Secondary to pneumonia and UTI - 05/10/24 UA >50 WBC - 05/10/24 urine culture = VRE - Start linezolid - Start cefepime  - Continue azithromycin   VRE UTI - Continue linezolid  Acute respiratory failure with hypoxia - Secondary to COPD exacerbation - Presented with oxygen saturation 88% on room air - Stable on 2 L - Wean oxygen as tolerated  COPD exacerbation - Start Solu-Medrol  - Start DuoNebs - Start Brovana  - Start Pulmicort   Hyperlipidemia - Continue statin  Controlled diabetes mellitus type 2 - 04/25/2024 hemoglobin A1c 7.0 - Holding metformin  - NovoLog  sliding scale  Chronic pain - Continue Lyrica   History of stroke - Continue aspirin , Plavix , statin  Depression/anxiety - Continue Trintellix  and venlafaxine   Hypothyroidism - Continue Synthroid      Advance Care Planning: DNR  Consults: none  Family Communication: daughter 5/28  Severity of Illness: The appropriate patient status for this patient is INPATIENT. Inpatient status is judged to be reasonable and necessary in order to provide the required intensity of service to ensure the patient's safety. The patient's presenting symptoms, physical exam findings, and initial radiographic and laboratory data in the context of their chronic comorbidities is felt to place them at high risk for further clinical deterioration. Furthermore, it is not anticipated that the patient will be medically stable for discharge from the hospital within 2 midnights of admission.   * I certify that at the point of admission it is my clinical judgment that the patient will require  inpatient hospital care spanning beyond 2 midnights from the point of admission due to high intensity of service, high risk for further deterioration and high frequency of surveillance required.*  Author: Demaris Fillers, MD 05/22/2024 1:37 PM  For on call review [email or web address].

## 2024-05-22 NOTE — Plan of Care (Signed)

## 2024-05-22 NOTE — Progress Notes (Addendum)
 Pharmacy Antibiotic Note  [name] White is a 83 y.o. female admitted on 05/22/2024 with pneumonia.  Pharmacy has been consulted for vancomycin  dosing.  Vancomycin  750 mg IV Q 24 hrs. Goal AUC 400-550. Expected AUC: 402 SCr used: 0.82 Will plan on vancomycin  levels as indicated  Height: 5\' 5"  (165.1 cm) Weight: 58.9 kg (129 lb 13.6 oz) IBW/kg (Calculated) : 57  Temp (24hrs), Avg:98.4 F (36.9 C), Min:98.4 F (36.9 C), Max:98.4 F (36.9 C)  Recent Labs  Lab 05/22/24 1134  WBC 17.1*  CREATININE 0.82  LATICACIDVEN 2.8*    Estimated Creatinine Clearance: 47.6 mL/min (by C-G formula based on SCr of 0.82 mg/dL).    Allergies  Allergen Reactions   Betadine [Povidone Iodine] Hives   Penicillins Rash    Thank you for allowing pharmacy to be a part of this patient's care.  Audra Blend PharmD., BCPS Clinical Pharmacist 05/22/2024 1:11 PM  Addendum: Zyvox  ordered by admitting [facility] MD Will d/c vancomycin  consult  05/22/2024 1:26 PM

## 2024-05-22 NOTE — ED Provider Notes (Signed)
 [place] EMERGENCY DEPARTMENT AT [facility] Provider Note  CSN: [id number] Arrival date & time: 05/22/24 1042  Chief Complaint(s) Weakness  HPI [name] White is a 83 y.o. female history of diabetes, hypertension, hyperlipidemia presenting to the emergency department with cough, shortness of breath, weakness.  Patient has multiple recent admissions for pneumonia.  Per EMS, patient was at her facility today had increasing weakness and trouble walking due to fatigue.  Patient endorses cough, seems a little confused.  Does report that she feels very dehydrated and weak and her mouth is very dry.  History limited due to her confusion   Past Medical History Past Medical History:  Diagnosis Date   Anxiety    Asthma    ASYMPTOMATIC POSTMENOPAUSAL STATUS 02/09/2009   Cataract    Constipation 03/19/2013   Depression    DIABETES MELLITUS, TYPE II 09/14/2007   HYPERCHOLESTEROLEMIA 02/09/2009   HYPERTENSION 02/09/2009   HYPOTHYROIDISM 09/14/2007   MIGRAINE HEADACHE 09/14/2007   Neuropathy    OSTEOPOROSIS 02/09/2009   PANCREATITIS 09/14/2007   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   PONV (postoperative nausea and vomiting)    Rectocele 03/19/2013   Shortness of breath    SUPERFICIAL PHLEBITIS 09/14/2007   Varicose veins    Patient Active Problem List   Diagnosis Date Noted   Sepsis due to undetermined organism (HCC) 05/22/2024   Severe sepsis (HCC) 05/10/2024   Acute on chronic respiratory failure with hypoxia (HCC) 05/10/2024   Hypomagnesemia 05/01/2024   Septic shock (HCC) 04/24/2024   CAP (community acquired pneumonia) 04/24/2024   Acute respiratory failure with hypoxia (HCC) 04/24/2024   CVA (cerebral vascular accident) (HCC) 03/12/2024   Acute lower UTI 03/12/2024   Type 2 diabetes mellitus with peripheral neuropathy (HCC) 03/12/2024   Anxiety and depression 03/12/2024   GERD without esophagitis 03/12/2024   Acute encephalopathy 03/11/2024   Rhabdomyolysis 12/27/2023   Pressure  injury of skin 12/27/2023   UTI (urinary tract infection) 12/26/2023   Sepsis due to urinary tract infection (HCC) 12/26/2023   Acute metabolic encephalopathy 12/26/2023   Status post transsphenoidal pituitary resection (HCC) 06/04/2020   Pituitary adenoma with extrasellar extension (HCC) 06/04/2020   Pituitary tumor 05/29/2020   Dizzinesses 02/02/2020   Acute pain of right shoulder 02/02/2020   Encounter for screening mammogram for malignant neoplasm of breast 01/28/2020   Vitamin D  deficiency 01/28/2020   Paroxysmal supraventricular tachycardia (HCC) 06/19/2014   Rectocele 03/19/2013   Constipation 03/19/2013   HYPERCHOLESTEROLEMIA 02/09/2009   Essential hypertension 02/09/2009   Osteoporosis 02/09/2009   Post-menopausal 02/09/2009   HEAT INTOLERANCE 02/01/2008   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   Hypothyroidism 09/14/2007   Diabetes (HCC) 09/14/2007   Migraine headache 09/14/2007   SUPERFICIAL PHLEBITIS 09/14/2007   PANCREATITIS 09/14/2007   MENOPAUSAL SYNDROME 09/14/2007   FATIGUE 09/14/2007   Home Medication(s) Prior to Admission medications   Medication Sig Start Date End Date Taking? Authorizing Provider  acetaminophen  (TYLENOL ) 325 MG tablet Take 2 tablets (650 mg total) by mouth every 6 (six) hours as needed for mild pain (pain score 1-3) (or Fever >/= 101). 01/01/24   Shahmehdi, Constantino Demark, MD  albuterol  (VENTOLIN  HFA) 108 (90 Base) MCG/ACT inhaler Inhale 2 puffs into the lungs every 4 (four) hours as needed for wheezing or shortness of breath. 05/01/24   Johnson, Clanford L, MD  aspirin  EC 81 MG tablet Take 81 mg by mouth daily. Swallow whole.    [provider]  atorvastatin  (LIPITOR) 40 MG tablet Take 1 tablet (40 mg  total) by mouth daily. 03/20/24   Colin Dawley, MD  baclofen  (LIORESAL ) 10 MG tablet Take 1 tablet (10 mg total) by mouth at bedtime. 05/14/24   Mason Sole, Pratik D, DO  clopidogrel  (PLAVIX ) 75 MG tablet Take 1 tablet (75 mg total) by mouth daily. 05/15/24    Johnson, Clanford L, MD  cycloSPORINE  (RESTASIS ) 0.05 % ophthalmic emulsion Place 1 drop into both eyes 2 (two) times daily.    [provider]  dextromethorphan  (DELSYM ) 30 MG/5ML liquid Take 5 mLs (30 mg total) by mouth 2 (two) times daily as needed for cough. 05/01/24   Johnson, Clanford L, MD  fluticasone  furoate-vilanterol (BREO ELLIPTA ) 200-25 MCG/ACT AEPB Inhale 1 puff into the lungs daily. 03/19/24   Colin Dawley, MD  furosemide  (LASIX ) 20 MG tablet Take 1 tablet (20 mg total) by mouth daily. 05/03/24 05/03/25  Johnson, Clanford L, MD  ipratropium-albuterol  (DUONEB) 0.5-2.5 (3) MG/3ML SOLN Take 3 mLs by nebulization every 4 (four) hours as needed (wheezing, shortness of breath). 05/01/24   Johnson, Clanford L, MD  levothyroxine  (SYNTHROID ) 50 MCG tablet Take 50 mcg by mouth daily.    [provider]  metFORMIN  (GLUCOPHAGE ) 500 MG tablet Take 500 mg by mouth 2 (two) times daily. 04/16/24   [provider]  Multiple Vitamins-Minerals (MULTIVITAMINS THER. W/MINERALS) TABS Take 1 tablet by mouth every morning.    [provider]  omeprazole (PRILOSEC) 20 MG capsule Take 20 mg by mouth every morning.    [provider]  polyethylene glycol (MIRALAX  / GLYCOLAX ) 17 g packet Take 17 g by mouth daily as needed for mild constipation. 05/01/24   Johnson, Clanford L, MD  potassium chloride  (KLOR-CON  M) 10 MEQ tablet Take 1 tablet (10 mEq total) by mouth daily. 05/03/24   Johnson, Clanford L, MD  pregabalin  (LYRICA ) 150 MG capsule Take 1 capsule (150 mg total) by mouth 2 (two) times daily. 03/19/24   Colin Dawley, MD  rizatriptan  (MAXALT ) 10 MG tablet Take 10 mg by mouth every 2 (two) hours as needed for migraine. No more than 2 doses in 24 hours 04/16/24   [provider]  senna (SENOKOT) 8.6 MG TABS tablet Take 2 tablets by mouth at bedtime.    [provider]  traZODone  (DESYREL ) 50 MG tablet Take 1 tablet (50 mg total) by mouth at bedtime as needed  for sleep. 03/19/24   Colin Dawley, MD  TRINTELLIX  10 MG TABS tablet Take 10 mg by mouth daily. 03/09/24   [provider]  venlafaxine  (EFFEXOR -XR) 150 MG 24 hr capsule Take 150 mg by mouth every morning.    [provider]  Vibegron (GEMTESA) 75 MG TABS Take 75 mg by mouth at bedtime.    [provider]  Past Surgical History Past Surgical History:  Procedure Laterality Date   ABDOMINAL HYSTERECTOMY     ANTERIOR AND POSTERIOR REPAIR N/A 04/02/2013   Procedure: ANTERIOR (CYSTOCELE) AND POSTERIOR REPAIR (RECTOCELE);  Surgeon: Albino Hum, MD;  Location: [facility];  Service: Gynecology;  Laterality: N/A;   APPENDECTOMY     BRAIN SURGERY     CATARACT EXTRACTION W/PHACO  12/05/2011   Procedure: CATARACT EXTRACTION PHACO AND INTRAOCULAR LENS PLACEMENT (IOC);  Surgeon: Anner Kill;  Location: [facility];  Service: Ophthalmology;  Laterality: Left;  CDE:9.65   CHOLECYSTECTOMY     CRANIOTOMY N/A 06/04/2020   Procedure: Endoscopic Transphenoidal Resection of Recurrent PituitaryTumor;  Surgeon: Elna Haggis, MD;  Location: [facility] OR;  Service: Neurosurgery;  Laterality: N/A;   CYSTOSCOPY     ENDOVENOUS ABLATION SAPHENOUS VEIN W/ LASER  11-03-2011   right greater saphenous vein   left leg done 10-2011   EYE SURGERY  98   right cataract extraction 98   LAPAROSCOPIC NISSEN FUNDOPLICATION     NM ESOPHAGEAL REFLUX  08-11-11   PITUITARY EXCISION  10/1997   POSTERIOR REPAIR     TRANSNASAL APPROACH N/A 06/04/2020   Procedure: TRANSNASAL APPROACH;  Surgeon: Ammon Bales, MD;  Location: [facility];  Service: ENT;  Laterality: N/A;   TRANSPHENOIDAL / TRANSNASAL HYPOPHYSECTOMY / RESECTION PITUITARY TUMOR  08-11-11   Family History Family History  Problem Relation Age of Onset   Heart disease Mother    Asthma Mother    COPD Father    Heart disease Father     Stroke Father    Asthma Daughter    Migraines Daughter    Neuropathy Son    Cancer Neg Hx    Anesthesia problems Neg Hx    Hypotension Neg Hx    Malignant hyperthermia Neg Hx    Pseudochol deficiency Neg Hx     Social History Social History   Tobacco Use   Smoking status: Never   Smokeless tobacco: Never  Vaping Use   Vaping status: Never Used  Substance Use Topics   Alcohol use: No    Alcohol/week: 0.0 standard drinks of alcohol   Drug use: No   Allergies Betadine [povidone iodine] and Penicillins  Review of Systems Review of Systems  All other systems reviewed and are negative.   Physical Exam Vital Signs  I have reviewed the triage vital signs BP (!) 87/57   Pulse 96   Temp 98.4 F (36.9 C) (Oral)   Resp 17   Ht 5\' 5"  (1.651 m)   Wt 58.9 kg   SpO2 96%   BMI 21.61 kg/m  Physical Exam Vitals and nursing note reviewed.  Constitutional:      General: She is not in acute distress.    Appearance: She is well-developed.  HENT:     Head: Normocephalic and atraumatic.     Mouth/Throat:     Mouth: Mucous membranes are dry.  Eyes:     Pupils: Pupils are equal, round, and reactive to light.  Cardiovascular:     Rate and Rhythm: Normal rate and regular rhythm.     Heart sounds: No murmur heard. Pulmonary:     Comments: Mild increased work of breathing, diffuse rhonchorous breath sounds Abdominal:     General: Abdomen is flat.     Palpations: Abdomen is soft.     Tenderness: There is no abdominal tenderness.  Musculoskeletal:        General: No tenderness.     Right lower  leg: No edema.     Left lower leg: No edema.  Skin:    General: Skin is warm and dry.  Neurological:     General: No focal deficit present.     Mental Status: She is alert. Mental status is at baseline.     Comments: Oriented to person, place, situation, not to year  Psychiatric:        Mood and Affect: Mood normal.        Behavior: Behavior normal.     ED Results and  Treatments Labs (all labs ordered are listed, but only abnormal results are displayed) Labs Reviewed  COMPREHENSIVE METABOLIC PANEL WITH GFR - Abnormal; Notable for the following components:      Result Value   Sodium 130 (*)    Chloride 91 (*)    Glucose, Bld 388 (*)    Calcium  8.4 (*)    Total Protein 6.2 (*)    Albumin 2.9 (*)    All other components within normal limits  CBC WITH DIFFERENTIAL/PLATELET - Abnormal; Notable for the following components:   WBC 17.1 (*)    RDW 17.5 (*)    Neutro Abs 13.5 (*)    Abs Immature Granulocytes 0.24 (*)    All other components within normal limits  LACTIC ACID, PLASMA - Abnormal; Notable for the following components:   Lactic Acid, Venous 2.8 (*)    All other components within normal limits  CULTURE, BLOOD (ROUTINE X 2)  CULTURE, BLOOD (ROUTINE X 2)  MRSA NEXT GEN BY PCR, NASAL  LACTIC ACID, PLASMA  URINALYSIS, W/ REFLEX TO CULTURE (INFECTION SUSPECTED)                                                                                                                          Radiology No results found.  Pertinent labs & imaging results that were available during my care of the patient were reviewed by me and considered in my medical decision making (see MDM for details).  Medications Ordered in ED Medications  azithromycin  (ZITHROMAX ) 500 mg in sodium chloride  0.9 % 250 mL IVPB (500 mg Intravenous New Bag/Given 05/22/24 1313)  lactated ringers  infusion ( Intravenous New Bag/Given 05/22/24 1313)  linezolid  (ZYVOX ) IVPB 600 mg (has no administration in time range)  Chlorhexidine  Gluconate Cloth 2 % PADS 6 each (has no administration in time range)  sodium chloride  0.9 % bolus 1,767 mL (1,767 mLs Intravenous New Bag/Given 05/22/24 1138)  ceFEPIme  (MAXIPIME ) 2 g in sodium chloride  0.9 % 100 mL IVPB (0 g Intravenous Stopped 05/22/24 1304)  Procedures .Critical Care  Performed by: Mordecai Applebaum, MD Authorized by: Mordecai Applebaum, MD   Critical care provider statement:    Critical care time (minutes):  30   Critical care was necessary to treat or prevent imminent or life-threatening deterioration of the following conditions:  Sepsis   Critical care was time spent personally by me on the following activities:  Development of treatment plan with patient or surrogate, discussions with consultants, evaluation of patient's response to treatment, examination of patient, ordering and review of laboratory studies, ordering and review of radiographic studies, ordering and performing treatments and interventions, pulse oximetry, re-evaluation of patient's condition and review of old charts   Care discussed with: admitting provider     (including critical care time)  Medical Decision Making / ED Course   MDM:  83 year old presenting to the emergency department with weakness.  On exam, patient has frequent cough, has some increased work of breathing, diffuse rhonchorous breath sounds on exam.  Not febrile but with low blood pressure.  Suspect recurrent pneumonia.  Chest x-ray slightly rotated but appears to have increased findings in the right hemithorax on x-ray.  Labs notable for leukocytosis, lactic acidosis.  Code sepsis activated, patient given IV fluids and antibiotics.  Will also check urinalysis.  No abdominal tenderness to suggest any intra-abdominal infection.  Patient slightly confused but denies headache, able to follow commands, no meningismus, doubt meningitis.  No obvious rashes to suggest skin or soft tissue infection.  Given recurrent sepsis patient will need to be admitted.  Discussed with hospitalist Dr. Winferd Hatter.      Additional history obtained: -Additional history obtained from ems -External records from outside source obtained and reviewed including: Chart review including previous  notes, labs, imaging, consultation notes including prior notes    Lab Tests: -I ordered, reviewed, and interpreted labs.   The pertinent results include:   Labs Reviewed  COMPREHENSIVE METABOLIC PANEL WITH GFR - Abnormal; Notable for the following components:      Result Value   Sodium 130 (*)    Chloride 91 (*)    Glucose, Bld 388 (*)    Calcium  8.4 (*)    Total Protein 6.2 (*)    Albumin 2.9 (*)    All other components within normal limits  CBC WITH DIFFERENTIAL/PLATELET - Abnormal; Notable for the following components:   WBC 17.1 (*)    RDW 17.5 (*)    Neutro Abs 13.5 (*)    Abs Immature Granulocytes 0.24 (*)    All other components within normal limits  LACTIC ACID, PLASMA - Abnormal; Notable for the following components:   Lactic Acid, Venous 2.8 (*)    All other components within normal limits  CULTURE, BLOOD (ROUTINE X 2)  CULTURE, BLOOD (ROUTINE X 2)  MRSA NEXT GEN BY PCR, NASAL  LACTIC ACID, PLASMA  URINALYSIS, W/ REFLEX TO CULTURE (INFECTION SUSPECTED)    Notable for lactic acidosis, leukocytosis   EKG   EKG Interpretation Date/Time:  Wednesday May 22 2024 11:06:19 EDT Ventricular Rate:  95 PR Interval:  122 QRS Duration:  87 QT Interval:  360 QTC Calculation: 453 R Axis:   60  Text Interpretation: Sinus rhythm Confirmed by Hiawatha Lout ([id number]) on 05/22/2024 12:37:27 PM         Imaging Studies ordered: I ordered imaging studies including CXR On my interpretation imaging demonstrates pneumonia I independently visualized and interpreted imaging. I agree with the radiologist interpretation   Medicines ordered and prescription drug management:  Meds ordered this encounter  Medications   sodium chloride  0.9 % bolus 1,767 mL   ceFEPIme  (MAXIPIME ) 2 g in sodium chloride  0.9 % 100 mL IVPB   azithromycin  (ZITHROMAX ) 500 mg in sodium chloride  0.9 % 250 mL IVPB    Antibiotic Indication::   CAP   lactated ringers  infusion   linezolid (ZYVOX) IVPB 600  mg    Antibiotic Indication::   Other Indication (list below)    Other Indication::   VRE UTI   Chlorhexidine  Gluconate Cloth 2 % PADS 6 each    -I have reviewed the patients home medicines and have made adjustments as needed   Consultations Obtained: I requested consultation with the hospitalist,  and discussed lab and imaging findings as well as pertinent plan - they recommend: admission   Cardiac Monitoring: The patient was maintained on a cardiac monitor.  I personally viewed and interpreted the cardiac monitored which showed an underlying rhythm of: NSR  Reevaluation: After the interventions noted above, I reevaluated the patient and found that their symptoms have improved  Co morbidities that complicate the patient evaluation  Past Medical History:  Diagnosis Date   Anxiety    Asthma    ASYMPTOMATIC POSTMENOPAUSAL STATUS 02/09/2009   Cataract    Constipation 03/19/2013   Depression    DIABETES MELLITUS, TYPE II 09/14/2007   HYPERCHOLESTEROLEMIA 02/09/2009   HYPERTENSION 02/09/2009   HYPOTHYROIDISM 09/14/2007   MIGRAINE HEADACHE 09/14/2007   Neuropathy    OSTEOPOROSIS 02/09/2009   PANCREATITIS 09/14/2007   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   PONV (postoperative nausea and vomiting)    Rectocele 03/19/2013   Shortness of breath    SUPERFICIAL PHLEBITIS 09/14/2007   Varicose veins       Dispostion: Disposition decision including need for hospitalization was considered, and patient admitted to the hospital.    Final Clinical Impression(s) / ED Diagnoses Final diagnoses:  Sepsis, due to unspecified organism, unspecified whether acute organ dysfunction present [facility])     This chart was dictated using voice recognition software.  Despite best efforts to proofread,  errors can occur which can change the documentation meaning.    Mordecai Applebaum, MD 05/22/24 [phone]

## 2024-05-23 ENCOUNTER — Other Ambulatory Visit (HOSPITAL_COMMUNITY): Payer: Self-pay | Admitting: *Deleted

## 2024-05-23 ENCOUNTER — Other Ambulatory Visit (HOSPITAL_COMMUNITY)

## 2024-05-23 DIAGNOSIS — A419 Sepsis, unspecified organism: Secondary | ICD-10-CM

## 2024-05-23 DIAGNOSIS — J690 Pneumonitis due to inhalation of food and vomit: Secondary | ICD-10-CM | POA: Insufficient documentation

## 2024-05-23 DIAGNOSIS — A4181 Sepsis due to Enterococcus: Secondary | ICD-10-CM | POA: Insufficient documentation

## 2024-05-23 DIAGNOSIS — J9601 Acute respiratory failure with hypoxia: Secondary | ICD-10-CM

## 2024-05-23 LAB — GLUCOSE, CAPILLARY
Glucose-Capillary: 334 mg/dL — ABNORMAL HIGH (ref 70–99)
Glucose-Capillary: 335 mg/dL — ABNORMAL HIGH (ref 70–99)
Glucose-Capillary: 336 mg/dL — ABNORMAL HIGH (ref 70–99)
Glucose-Capillary: 355 mg/dL — ABNORMAL HIGH (ref 70–99)
Glucose-Capillary: 431 mg/dL — ABNORMAL HIGH (ref 70–99)
Glucose-Capillary: 434 mg/dL — ABNORMAL HIGH (ref 70–99)
Glucose-Capillary: 439 mg/dL — ABNORMAL HIGH (ref 70–99)
Glucose-Capillary: 463 mg/dL — ABNORMAL HIGH (ref 70–99)
Glucose-Capillary: 524 mg/dL (ref 70–99)
Glucose-Capillary: 589 mg/dL (ref 70–99)
Glucose-Capillary: >600 mg/dL (ref 70–99)
Glucose-Capillary: >600 mg/dL (ref 70–99)
Glucose-Capillary: >600 mg/dL (ref 70–99)
Glucose-Capillary: >600 mg/dL (ref 70–99)

## 2024-05-23 LAB — RESPIRATORY PANEL BY PCR

## 2024-05-23 LAB — BASIC METABOLIC PANEL WITH GFR
Anion gap: 12 (ref 5–15)
Anion gap: 16 — ABNORMAL HIGH (ref 5–15)
Anion gap: 16 — ABNORMAL HIGH (ref 5–15)
BUN: 20 mg/dL (ref 8–23)
BUN: 28 mg/dL — ABNORMAL HIGH (ref 8–23)
BUN: 30 mg/dL — ABNORMAL HIGH (ref 8–23)
CO2: 24 mmol/L (ref 22–32)
CO2: 21 mmol/L — ABNORMAL LOW (ref 22–32)
CO2: 22 mmol/L (ref 22–32)
Calcium: 7.9 mg/dL — ABNORMAL LOW (ref 8.9–10.3)
Calcium: 8.1 mg/dL — ABNORMAL LOW (ref 8.9–10.3)
Calcium: 8.4 mg/dL — ABNORMAL LOW (ref 8.9–10.3)
Chloride: 96 mmol/L — ABNORMAL LOW (ref 98–111)
Chloride: 89 mmol/L — ABNORMAL LOW (ref 98–111)
Chloride: 93 mmol/L — ABNORMAL LOW (ref 98–111)
Creatinine, Ser: 0.64 mg/dL (ref 0.44–1.00)
Creatinine, Ser: 0.95 mg/dL (ref 0.44–1.00)
Creatinine, Ser: 0.99 mg/dL (ref 0.44–1.00)
GFR, Estimated: >60 mL/min (ref >60)
GFR, Estimated: 60 mL/min — ABNORMAL LOW (ref >60)
GFR, Estimated: 57 mL/min — ABNORMAL LOW (ref >60)
Glucose, Bld: 469 mg/dL — ABNORMAL HIGH (ref 70–99)
Glucose, Bld: 687 mg/dL (ref 70–99)
Glucose, Bld: 413 mg/dL — ABNORMAL HIGH (ref 70–99)
Potassium: 4.2 mmol/L (ref 3.5–5.1)
Potassium: 4.7 mmol/L (ref 3.5–5.1)
Potassium: 4.1 mmol/L (ref 3.5–5.1)
Sodium: 132 mmol/L — ABNORMAL LOW (ref 135–145)
Sodium: 126 mmol/L — ABNORMAL LOW (ref 135–145)
Sodium: 131 mmol/L — ABNORMAL LOW (ref 135–145)

## 2024-05-23 LAB — LACTIC ACID, PLASMA: Lactic Acid, Venous: 1.4 mmol/L (ref 0.5–1.9)

## 2024-05-23 LAB — CBC
HCT: 39.6 % (ref 36.0–46.0)
Hemoglobin: 13.4 g/dL (ref 12.0–15.0)
MCH: 30 pg (ref 26.0–34.0)
MCHC: 33.8 g/dL (ref 30.0–36.0)
MCV: 88.8 fL (ref 80.0–100.0)
Platelets: 271 10*3/uL (ref 150–400)
RBC: 4.46 MIL/uL (ref 3.87–5.11)
RDW: 17.2 % — ABNORMAL HIGH (ref 11.5–15.5)
WBC: 16.9 10*3/uL — ABNORMAL HIGH (ref 4.0–10.5)
nRBC: 0 % (ref 0.0–0.2)

## 2024-05-23 LAB — OSMOLALITY: Osmolality: 309 mosm/kg — ABNORMAL HIGH (ref 275–295)

## 2024-05-23 LAB — MAGNESIUM: Magnesium: 1.6 mg/dL — ABNORMAL LOW (ref 1.7–2.4)

## 2024-05-23 MED ORDER — POTASSIUM CHLORIDE 10 MEQ/100ML IV SOLN
10.0000 meq | INTRAVENOUS | Status: AC
  Administered 2024-05-23 (×2): 10 meq via INTRAVENOUS
  Filled 2024-05-23: qty 100

## 2024-05-23 MED ORDER — METHYLPREDNISOLONE SODIUM SUCC 125 MG IJ SOLR
60.0000 mg | Freq: Every day | INTRAMUSCULAR | Status: DC
  Administered 2024-05-24 – 2024-05-25 (×2): 60 mg via INTRAVENOUS
  Filled 2024-05-23 (×2): qty 2

## 2024-05-23 MED ORDER — REVEFENACIN 175 MCG/3ML IN SOLN
175.0000 ug | Freq: Every day | RESPIRATORY_TRACT | Status: DC
  Administered 2024-05-24 – 2024-05-28 (×5): 175 ug via RESPIRATORY_TRACT
  Filled 2024-05-23 (×5): qty 3

## 2024-05-23 MED ORDER — INSULIN ASPART 100 UNIT/ML IJ SOLN: 4.0000 [IU] | Freq: Three times a day (TID) | INTRAMUSCULAR | Status: DC

## 2024-05-23 MED ORDER — INSULIN ASPART 100 UNIT/ML IJ SOLN: 0.0000 [IU] | Freq: Three times a day (TID) | INTRAMUSCULAR | Status: DC

## 2024-05-23 MED ORDER — IPRATROPIUM-ALBUTEROL 0.5-2.5 (3) MG/3ML IN SOLN: 3.0000 mL | Freq: Four times a day (QID) | RESPIRATORY_TRACT | Status: DC | PRN

## 2024-05-23 MED ORDER — INSULIN REGULAR(HUMAN) IN NACL 100-0.9 UT/100ML-% IV SOLN
INTRAVENOUS | Status: DC
Start: 2024-05-23 — End: 2024-05-24
  Administered 2024-05-23: 12 [IU]/h via INTRAVENOUS
  Filled 2024-05-23 (×2): qty 100

## 2024-05-23 MED ORDER — INSULIN ASPART 100 UNIT/ML IJ SOLN: 0.0000 [IU] | Freq: Every day | INTRAMUSCULAR | Status: DC

## 2024-05-23 MED ORDER — INSULIN GLARGINE-YFGN 100 UNIT/ML ~~LOC~~ SOLN
10.0000 [IU] | Freq: Every day | SUBCUTANEOUS | Status: DC
  Administered 2024-05-23: 10 [IU] via SUBCUTANEOUS
  Filled 2024-05-23 (×2): qty 0.1

## 2024-05-23 MED ORDER — INSULIN ASPART 100 UNIT/ML IJ SOLN
19.0000 [IU] | Freq: Once | INTRAMUSCULAR | Status: AC
  Administered 2024-05-23: 19 [IU] via SUBCUTANEOUS

## 2024-05-23 MED ORDER — ALBUTEROL SULFATE (2.5 MG/3ML) 0.083% IN NEBU: 2.5000 mg | INHALATION_SOLUTION | Freq: Four times a day (QID) | RESPIRATORY_TRACT | Status: DC

## 2024-05-23 MED ORDER — FUROSEMIDE 10 MG/ML IJ SOLN
40.0000 mg | Freq: Once | INTRAMUSCULAR | Status: AC
  Administered 2024-05-23: 40 mg via INTRAVENOUS
  Filled 2024-05-23: qty 4

## 2024-05-23 MED ORDER — ALBUTEROL SULFATE (2.5 MG/3ML) 0.083% IN NEBU
2.5000 mg | INHALATION_SOLUTION | Freq: Three times a day (TID) | RESPIRATORY_TRACT | Status: DC
  Administered 2024-05-24 – 2024-05-26 (×9): 2.5 mg via RESPIRATORY_TRACT
  Filled 2024-05-23 (×9): qty 3

## 2024-05-23 MED ORDER — BOOST / RESOURCE BREEZE PO LIQD CUSTOM
1.0000 | Freq: Three times a day (TID) | ORAL | Status: DC
  Administered 2024-05-24 – 2024-05-27 (×9): 1 via ORAL

## 2024-05-23 MED ORDER — DEXTROSE 50 % IV SOLN: 0.0000 mL | INTRAVENOUS | Status: DC | PRN

## 2024-05-23 MED ORDER — MAGNESIUM SULFATE 2 GM/50ML IV SOLN
2.0000 g | Freq: Once | INTRAVENOUS | Status: AC
  Administered 2024-05-23: 2 g via INTRAVENOUS
  Filled 2024-05-23: qty 50

## 2024-05-23 NOTE — Progress Notes (Signed)
 Date and time results received: 05/23/24 1725 (use smartphrase ".now" to insert current time)  Test: Glucose (Lab draw)  Critical Value: 687  Name of Provider Notified: Dr Winferd Hatter and Waunita Haff RN   Orders Received? Or Actions Taken?: Awaiting new orders.

## 2024-05-23 NOTE — Evaluation (Signed)
 Physical Therapy Evaluation Patient Details Name: [name] White MRN: [id number] DOB: 13-Sep-1941 Today's Date: 05/23/2024  History of Present Illness  [name] White is a 83 year old female with a history of diabetes mellitus type 2, stroke, hypertension, COPD, hypothyroidism, hyperlipidemia, anxiety, PSVT presenting with coughing, chest congestion, shortness of breath.  The patient denies any fevers, chills, chest pain, nausea, vomiting or diarrhea.  She denies any hemoptysis.  She was recently admitted to the hospital from 05/10/2024 to 05/15/2024.  During that hospitalization, the patient was treated for COPD exacerbation and what was felt to be atypical pneumonia.  She was discharged with a prednisone  taper.  In addition, the patient was also admitted from 04/24/2024 to 05/01/2024 with septic shock secondary pneumonia.  She has had increasing generalized weakness since discharge from the hospital.  There is no hematochezia or melena.  She does complain of some dysuria for the past week.   Clinical Impression  Patient demonstrates slow labored movement for sitting up at bedside, once seated c/o mild lightheadedness that resolved after a few minutes, unsteady on feet and limited to a few side steps before having to sit due to c/o fatigue. Patient tolerated sitting up in chair after therapy with her daughter present. Patient will benefit from continued skilled physical therapy in hospital and recommended venue below to increase strength, balance, endurance for safe ADLs and gait.           If plan is discharge home, recommend the following: A lot of help with bathing/dressing/bathroom;A lot of help with walking and/or transfers;Help with stairs or ramp for entrance;Assistance with cooking/housework   Can travel by private vehicle   Yes    Equipment Recommendations None recommended by PT  Recommendations for Other Services       Functional Status Assessment Patient has had a recent decline in  their functional status and demonstrates the ability to make significant improvements in function in a reasonable and predictable amount of time.     Precautions / Restrictions Precautions Precautions: Fall Recall of Precautions/Restrictions: Intact Restrictions Weight Bearing Restrictions Per Provider Order: No      Mobility  Bed Mobility Overal bed mobility: Needs Assistance Bed Mobility: Supine to Sit     Supine to sit: Min assist, Mod assist     General bed mobility comments: increased time, labored movement    Transfers Overall transfer level: Needs assistance Equipment used: Rolling walker (2 wheels) Transfers: Sit to/from Stand, Bed to chair/wheelchair/BSC Sit to Stand: Min assist, Mod assist   Step pivot transfers: Min assist, Mod assist       General transfer comment: unsteady labored movement    Ambulation/Gait Ambulation/Gait assistance: Mod assist Gait Distance (Feet): 5 Feet Assistive device: Rolling walker (2 wheels) Gait Pattern/deviations: Decreased step length - right, Decreased step length - left, Decreased stride length, Trunk flexed Gait velocity: slow     General Gait Details: limited to a few slow labored steps at bedside due to fatigue, weakness  Stairs            Wheelchair Mobility     Tilt Bed    Modified Rankin (Stroke Patients Only)       Balance Overall balance assessment: Needs assistance Sitting-balance support: Feet supported, No upper extremity supported Sitting balance-Leahy Scale: Fair Sitting balance - Comments: fair/good seated at EOB   Standing balance support: Reliant on assistive device for balance, During functional activity, Bilateral upper extremity supported Standing balance-Leahy Scale: Poor Standing balance comment: fair/poor using RW  Pertinent Vitals/Pain Pain Assessment Pain Assessment: No/denies pain    Home Living Family/patient expects to be  discharged to:: Assisted living                 Home Equipment: Agricultural consultant (2 wheels);Rollator (4 wheels);Cane - quad;Cane - single point;Hand held shower head;Shower seat - built in;Grab bars - tub/shower      Prior Function Prior Level of Function : Needs assist       Physical Assist : Mobility (physical);ADLs (physical) Mobility (physical): Bed mobility;Transfers;Gait   Mobility Comments: Asissted short household distances using RW, uses w/c mostly ADLs Comments: Assisted by ALF staff     Extremity/Trunk Assessment   Upper Extremity Assessment Upper Extremity Assessment: Generalized weakness    Lower Extremity Assessment Lower Extremity Assessment: Generalized weakness    Cervical / Trunk Assessment Cervical / Trunk Assessment: Kyphotic  Communication   Communication Communication: No apparent difficulties    Cognition Arousal: Alert Behavior During Therapy: WFL for tasks assessed/performed   PT - Cognitive impairments: No apparent impairments                         Following commands: Intact       Cueing Cueing Techniques: Verbal cues, Tactile cues     General Comments      Exercises     Assessment/Plan    PT Assessment Patient needs continued PT services  PT Problem List Decreased strength;Decreased activity tolerance;Decreased balance;Decreased mobility       PT Treatment Interventions DME instruction;Gait training;Stair training;Functional mobility training;Therapeutic activities;Therapeutic exercise;Balance training;Patient/family education    PT Goals (Current goals can be found in the Care Plan section)  Acute Rehab PT Goals Patient Stated Goal: Return to ALF PT Goal Formulation: With patient/family Time For Goal Achievement: 06/06/24 Potential to Achieve Goals: Good    Frequency Min 3X/week     Co-evaluation               AM-PAC PT "6 Clicks" Mobility  Outcome Measure Help needed turning from your back  to your side while in a flat bed without using bedrails?: A Little Help needed moving from lying on your back to sitting on the side of a flat bed without using bedrails?: A Lot Help needed moving to and from a bed to a chair (including a wheelchair)?: A Lot Help needed standing up from a chair using your arms (e.g., wheelchair or bedside chair)?: A Little Help needed to walk in hospital room?: A Lot Help needed climbing 3-5 steps with a railing? : A Lot 6 Click Score: 14    End of Session   Activity Tolerance: Patient tolerated treatment well;Patient limited by fatigue Patient left: in chair;with call bell/phone within reach;with family/visitor present;with chair alarm set Nurse Communication: Mobility status PT Visit Diagnosis: Unsteadiness on feet (R26.81);Other abnormalities of gait and mobility (R26.89);Muscle weakness (generalized) (M62.81)    Time: 1430-1501 PT Time Calculation (min) (ACUTE ONLY): 31 min   Charges:   PT Evaluation $PT Eval Moderate Complexity: 1 Mod PT Treatments $Therapeutic Activity: 23-37 mins PT General Charges $$ ACUTE PT VISIT: 1 Visit         3:44 PM, 05/23/24 Walton Guppy, MPT Physical Therapist with [facility] [phone] office [phone] mobile phone

## 2024-05-23 NOTE — Evaluation (Signed)
 Clinical/Bedside Swallow Evaluation Patient Details  Name: [name] White MRN: [id number] Date of Birth: 1941-03-30  Today's Date: 05/23/2024 Time: SLP Start Time (ACUTE ONLY): 1358 SLP Stop Time (ACUTE ONLY): 1419 SLP Time Calculation (min) (ACUTE ONLY): 21 min  Past Medical History:  Past Medical History:  Diagnosis Date   Anxiety    Asthma    ASYMPTOMATIC POSTMENOPAUSAL STATUS 02/09/2009   Cataract    Constipation 03/19/2013   Depression    DIABETES MELLITUS, TYPE II 09/14/2007   HYPERCHOLESTEROLEMIA 02/09/2009   HYPERTENSION 02/09/2009   HYPOTHYROIDISM 09/14/2007   MIGRAINE HEADACHE 09/14/2007   Neuropathy    OSTEOPOROSIS 02/09/2009   PANCREATITIS 09/14/2007   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   PONV (postoperative nausea and vomiting)    Rectocele 03/19/2013   Shortness of breath    SUPERFICIAL PHLEBITIS 09/14/2007   Varicose veins    Past Surgical History:  Past Surgical History:  Procedure Laterality Date   ABDOMINAL HYSTERECTOMY     ANTERIOR AND POSTERIOR REPAIR N/A 04/02/2013   Procedure: ANTERIOR (CYSTOCELE) AND POSTERIOR REPAIR (RECTOCELE);  Surgeon: Albino Hum, MD;  Location: [facility];  Service: Gynecology;  Laterality: N/A;   APPENDECTOMY     BRAIN SURGERY     CATARACT EXTRACTION W/PHACO  12/05/2011   Procedure: CATARACT EXTRACTION PHACO AND INTRAOCULAR LENS PLACEMENT (IOC);  Surgeon: Anner Kill;  Location: [facility];  Service: Ophthalmology;  Laterality: Left;  CDE:9.65   CHOLECYSTECTOMY     CRANIOTOMY N/A 06/04/2020   Procedure: Endoscopic Transphenoidal Resection of Recurrent PituitaryTumor;  Surgeon: Elna Haggis, MD;  Location: [facility] OR;  Service: Neurosurgery;  Laterality: N/A;   CYSTOSCOPY     ENDOVENOUS ABLATION SAPHENOUS VEIN W/ LASER  11-03-2011   right greater saphenous vein   left leg done 10-2011   EYE SURGERY  98   right cataract extraction 98   LAPAROSCOPIC NISSEN FUNDOPLICATION     NM ESOPHAGEAL REFLUX  08-11-11   PITUITARY EXCISION  10/1997   POSTERIOR  REPAIR     TRANSNASAL APPROACH N/A 06/04/2020   Procedure: TRANSNASAL APPROACH;  Surgeon: Ammon Bales, MD;  Location: [facility] OR;  Service: ENT;  Laterality: N/A;   TRANSPHENOIDAL / TRANSNASAL HYPOPHYSECTOMY / RESECTION PITUITARY TUMOR  08-11-11   HPI:  83 year old female with a history of diabetes mellitus type 2, stroke, hypertension, COPD, hypothyroidism, hyperlipidemia, anxiety, PSVT presenting with coughing, chest congestion, shortness of breath.  The patient denies any fevers, chills, chest pain, nausea, vomiting or diarrhea.  She denies any hemoptysis.  She was recently admitted to the hospital from 05/10/2024 to 05/15/2024.  During that hospitalization, the patient was treated for COPD exacerbation and what was felt to be atypical pneumonia.  She was discharged with a prednisone  taper.  In addition, the patient was also admitted from 04/24/2024 to 05/01/2024 with septic shock secondary pneumonia.  She has had increasing generalized weakness since discharge from the hospital.  There is no hematochezia or melena.  She does complain of some dysuria for the past week.  In the ED, the patient was afebrile with soft blood pressures in the low 90s and upper 80s.  WBC 17.1, hemoglobin 12.5, platelet 236.  Sodium 130, potassium 4.2, bicarbonate 26, serum creatinine 0.82.  LFTs were unremarkable.  Chest x-ray showed chronic interstitial markings.  Lactic acid 2.8.  The patient was started on linezolid and cefepime . Pt had MBSS 04/29/2024 with recommendation for D3/thin and evidence of Zenker's diverticulum. BSE requested.   MBSS completed 04/29/2024:   <<Patient presents with  oropharyngeal swallowing to be essentially [facility]. Note one episode of flash frank penetration of thin liquids that was cleared during the swallow and occasional premature spillage of thin liquids penetrated before the swallow but was cleared during the swallow every time. Swallow is often triggered at the posterior angle of the ramus but sometimes  is delayed until bolus fills the pyriform sinuses. Pharyngeal stripping wave is slightly diminished resulting in occasional trace pharyngeal residue. Note suspected small Zenker's Diverticulum (ZD) - no radiologist present to confirm. Residue in ZD with retrograde movement back up to the pyriforms. Repeat dry swallow clears retrograde residue from the pyriforms, however note it again collects in the ZD. No retrograde residue was penetrated or aspirated. Esophageal sweep was unremarkable. Recommend upgrade Pt's diet to D3/mech soft and continue with thin liquids. Recommend Pt utilize repeat swallow with every sip/bite to ensure trace/min retrograde residue is cleared from pharynx. Reviewed findings with patient and RN. There are no further ST needs noted at this time, our service will sign off.>>   Assessment / Plan / Recommendation  Clinical Impression  Pt known to SLP service from recent admission and had MBSS 04/29/24 (see above). Clinical swallow evaluation completed at bedside. Pt presents with congested cough and questions fluid overload (sounds deeper). Oral motor exam is WNL. Pt denies difficulty swallowing. She was assessed with ice chips, thin water  via cup/straw, puree, and graham crackers all self presented. Pt does not exhibit coughing with PO intake. Pt is at risk for aspiration with poor trunk support, AMS, compromised respiratory status, and current bedbound status. Recommend D3 and thin and Pt to swallow 2x for each sip with supervision for PO intake and PO medications whole in puree or with water . Pt did present with delay in swallow trigger during MBSS in early May and exhibited transient penetration of thins without aspiration. SLP will follow in acute setting. Above to RN. SLP Visit Diagnosis: Dysphagia, unspecified (R13.10)    Aspiration Risk  Mild aspiration risk    Diet Recommendation Dysphagia 3 (Mech soft);Thin liquid    Liquid Administration via: Cup;Straw Medication  Administration: Whole meds with liquid Supervision: Patient able to self feed;Full supervision/cueing for compensatory strategies Compensations: Slow rate;Small sips/bites;Multiple dry swallows after each bite/sip Postural Changes: Seated upright at 90 degrees;Remain upright for at least 30 minutes after po intake    Other  Recommendations Oral Care Recommendations: Oral care BID;Staff/trained caregiver to provide oral care    Recommendations for follow up therapy are one component of a multi-disciplinary discharge planning process, led by the attending physician.  Recommendations may be updated based on patient status, additional functional criteria and insurance authorization.  Follow up Recommendations Follow physician's recommendations for discharge plan and follow up therapies      Assistance Recommended at Discharge    Functional Status Assessment Patient has had a recent decline in their functional status and demonstrates the ability to make significant improvements in function in a reasonable and predictable amount of time.  Frequency and Duration min 2x/week  1 week       Prognosis Prognosis for improved oropharyngeal function: Fair Barriers to Reach Goals: Cognitive deficits      Swallow Study   General Date of Onset: 05/23/24 HPI: 83 year old female with a history of diabetes mellitus type 2, stroke, hypertension, COPD, hypothyroidism, hyperlipidemia, anxiety, PSVT presenting with coughing, chest congestion, shortness of breath.  The patient denies any fevers, chills, chest pain, nausea, vomiting or diarrhea.  She denies any hemoptysis.  She was  recently admitted to the hospital from 05/10/2024 to 05/15/2024.  During that hospitalization, the patient was treated for COPD exacerbation and what was felt to be atypical pneumonia.  She was discharged with a prednisone  taper.  In addition, the patient was also admitted from 04/24/2024 to 05/01/2024 with septic shock secondary pneumonia.   She has had increasing generalized weakness since discharge from the hospital.  There is no hematochezia or melena.  She does complain of some dysuria for the past week.  In the ED, the patient was afebrile with soft blood pressures in the low 90s and upper 80s.  WBC 17.1, hemoglobin 12.5, platelet 236.  Sodium 130, potassium 4.2, bicarbonate 26, serum creatinine 0.82.  LFTs were unremarkable.  Chest x-ray showed chronic interstitial markings.  Lactic acid 2.8.  The patient was started on linezolid  and cefepime . Pt had MBSS 04/29/2024 with recommendation for D3/thin and evidence of Zenker's diverticulum. BSE requested. Type of Study: Bedside Swallow Evaluation Previous Swallow Assessment: MBSS 04/29/2024 D3/thin, small ZD Diet Prior to this Study: Regular;Thin liquids (Level 0) Temperature Spikes Noted: No Respiratory Status: Room air History of Recent Intubation: No Behavior/Cognition: Alert;Cooperative;Pleasant mood;Confused Oral Cavity Assessment: Within Functional Limits Oral Care Completed by SLP: Yes Oral Cavity - Dentition: Adequate natural dentition;Missing dentition Vision: Functional for self-feeding Self-Feeding Abilities: Needs assist;Needs set up Patient Positioning: Upright in bed Baseline Vocal Quality: Normal (audible chest congestion, ? fluid overload) Volitional Cough: Weak;Congested Volitional Swallow: Able to elicit    Oral/Motor/Sensory Function Overall Oral Motor/Sensory Function: Within functional limits   Ice Chips Ice chips: Within functional limits Presentation: Spoon   Thin Liquid Thin Liquid: Within functional limits Presentation: Cup;Self Fed;Straw    Nectar Thick Nectar Thick Liquid: Not tested   Honey Thick Honey Thick Liquid: Not tested   Puree Puree: Within functional limits Presentation: Spoon   Solid     Solid: Within functional limits Presentation: Self Fed     Thank you,  [name] White, CCC-SLP [phone]  Evadna Donaghy 05/23/2024,2:20  PM

## 2024-05-23 NOTE — Progress Notes (Signed)
 PROGRESS NOTE  [name] White ZOX:[id number] DOB: [birth year] DOA: 05/22/2024 PCP: Omie Bickers, MD  Brief History:  83 year old female with a history of diabetes mellitus type 2, stroke, hypertension, COPD, hypothyroidism, hyperlipidemia, anxiety, PSVT presenting with coughing, chest congestion, shortness of breath.  The patient denies any fevers, chills, chest pain, nausea, vomiting or diarrhea.  She denies any hemoptysis.  She was recently admitted to the hospital from 05/10/2024 to 05/15/2024.  During that hospitalization, the patient was treated for COPD exacerbation and what was felt to be atypical pneumonia.  She was discharged with a prednisone  taper.  In addition, the patient was also admitted from 04/24/2024 to 05/01/2024 with septic shock secondary pneumonia. She has had increasing generalized weakness since discharge from the hospital.  There is no hematochezia or melena.  She does complain of some dysuria for the past week. In the ED, the patient was afebrile with soft blood pressures in the low 90s and upper 80s.  WBC 17.1, hemoglobin 12.5, platelet 236.  Sodium 130, potassium 4.2, bicarbonate 26, serum creatinine 0.82.  LFTs were unremarkable.  Chest x-ray showed chronic interstitial markings.  Lactic acid 2.8.  The patient was started on linezolid and cefepime .   Assessment/Plan: Severe sepsis - Presented with leukocytosis, lactic acid 2.8, low BPs - Secondary to pneumonia and UTI - 05/10/24 UA >50 WBC - 05/10/24 urine culture = VRE - Continue linezolid - continue cefepime  - Continue azithromycin    VRE UTI - Continue linezolid   Acute respiratory failure with hypoxia - Secondary to COPD exacerbation - Presented with oxygen saturation 88% on room air - Stable on 2 L - Wean oxygen as tolerated - give lasix  IV x 1 - COVID/RSV/Flu--neg - viral respiratory panel - CTA chest--neg PE;  scattered GGO bilateral UL.  Bibasilar patchy consolidation   COPD exacerbation -  Continue Solu-Medrol  - Continue DuoNebs - Continue Brovana  - Continue Pulmicort  - add Yupelri   Hyperlipidemia - Continue statin   Controlled diabetes mellitus type 2 - 04/25/2024 hemoglobin A1c 7.0 - Holding metformin  - NovoLog  sliding scale   Chronic pain - Continue Lyrica    History of stroke - Continue aspirin , Plavix , statin   Depression/anxiety - Hold Trintellix  and venlafaxine  temporarily while getting linezolid   Hypothyroidism - Continue Synthroid   Aspiration pneumonitis -continue abx -speech therapy eval requested  Hypomagnesemia -replete        Family Communication:   daughter at bedside 5/29  Consultants:  none  Code Status:  DNR  DVT Prophylaxis:  Juana Di­az Lovenox    Procedures: As Listed in Progress Note Above  Antibiotics: Zyvox 5/28>> Cefepime  5/28>> Azithro 5/28>>    Subjective: Pt states breathing not much better.  Denies f/c, cp, n/v/d, abd pain  Objective: Vitals:   05/23/24 0945 05/23/24 1000 05/23/24 1015 05/23/24 1030  BP:  (!) 147/56 (!) 143/64 (!) 142/65  Pulse: 91 93 93 95  Resp: 12 14 14 17   Temp:      TempSrc:      SpO2: 98% 97% 95% 93%  Weight:      Height:        Intake/Output Summary (Last 24 hours) at 05/23/2024 1232 Last data filed at 05/23/2024 0522 Gross per 24 hour  Intake 1915.73 ml  Output 2350 ml  Net -434.27 ml   Weight change:  Exam:  General:  Pt is alert, follows commands appropriately, not in acute distress HEENT: No icterus, No thrush, No neck mass, [place]/AT  Cardiovascular: RRR, S1/S2, no rubs, no gallops Respiratory: bibasilar wheeze.  Bilateral rhonchi Abdomen: Soft/+BS, non tender, non distended, no guarding Extremities: No edema, No lymphangitis, No petechiae, No rashes, no synovitis   Data Reviewed: I have personally reviewed following labs and imaging studies Basic Metabolic Panel: Recent Labs  Lab 05/22/24 1134 05/23/24 0411  NA 130* 132*  K 4.2 4.2  CL 91* 96*  CO2 26 24   GLUCOSE 388* 469*  BUN 16 20  CREATININE 0.82 0.64  CALCIUM  8.4* 7.9*  MG  --  1.6*   Liver Function Tests: Recent Labs  Lab 05/22/24 1134  AST 16  ALT 33  ALKPHOS 51  BILITOT 1.0  PROT 6.2*  ALBUMIN 2.9*   No results for input(s): "LIPASE", "AMYLASE" in the last 168 hours. No results for input(s): "AMMONIA" in the last 168 hours. Coagulation Profile: No results for input(s): "INR", "PROTIME" in the last 168 hours. CBC: Recent Labs  Lab 05/22/24 1134 05/23/24 0411  WBC 17.1* 16.9*  NEUTROABS 13.5*  --   HGB 12.5 13.4  HCT 37.6 39.6  MCV 90.4 88.8  PLT 236 271   Cardiac Enzymes: No results for input(s): "CKTOTAL", "CKMB", "CKMBINDEX", "TROPONINI" in the last 168 hours. BNP: Invalid input(s): "POCBNP" CBG: Recent Labs  Lab 05/23/24 0514 05/23/24 0724 05/23/24 1115  GLUCAP 439* 434* 463*   HbA1C: No results for input(s): "HGBA1C" in the last 72 hours. Urine analysis:    Component Value Date/Time   COLORURINE YELLOW 05/22/2024 1428   APPEARANCEUR HAZY (A) 05/22/2024 1428   LABSPEC 1.011 05/22/2024 1428   PHURINE 7.0 05/22/2024 1428   GLUCOSEU >=500 (A) 05/22/2024 1428   GLUCOSEU >=1000 (A) 06/25/2020 1441   HGBUR NEGATIVE 05/22/2024 1428   BILIRUBINUR NEGATIVE 05/22/2024 1428   BILIRUBINUR neg 03/19/2013 1512   KETONESUR NEGATIVE 05/22/2024 1428   PROTEINUR NEGATIVE 05/22/2024 1428   UROBILINOGEN 0.2 06/25/2020 1441   NITRITE NEGATIVE 05/22/2024 1428   LEUKOCYTESUR LARGE (A) 05/22/2024 1428   Sepsis Labs: @LABRCNTIP (procalcitonin:4,lacticidven:4) ) Recent Results (from the past 240 hours)  Culture, blood (routine x 2)     Status: None (Preliminary result)   Collection Time: 05/22/24 11:31 AM   Specimen: BLOOD  Result Value Ref Range Status   Specimen Description BLOOD BLOOD LEFT ARM  Final   Special Requests   Final    BOTTLES DRAWN AEROBIC AND ANAEROBIC Blood Culture adequate volume   Culture   Final    NO GROWTH < 24 HOURS Performed at  [facility], [address] [place], [place] [address]    Report Status PENDING  Incomplete  Culture, blood (routine x 2)     Status: None (Preliminary result)   Collection Time: 05/22/24 11:34 AM   Specimen: BLOOD  Result Value Ref Range Status   Specimen Description BLOOD BLOOD RIGHT HAND  Final   Special Requests   Final    BOTTLES DRAWN AEROBIC AND ANAEROBIC Blood Culture results may not be optimal due to an inadequate volume of blood received in culture bottles   Culture   Final    NO GROWTH < 24 HOURS Performed at [facility], [address] [place], [place] [address]    Report Status PENDING  Incomplete  MRSA Next Gen by PCR, Nasal     Status: None   Collection Time: 05/22/24  3:37 PM   Specimen: Nasal Mucosa; Nasal Swab  Result Value Ref Range Status   MRSA by PCR Next Gen NOT DETECTED NOT DETECTED  Final    Comment: (NOTE) The GeneXpert MRSA Assay (FDA approved for NASAL specimens only), is one component of a comprehensive MRSA colonization surveillance program. It is not intended to diagnose MRSA infection nor to guide or monitor treatment for MRSA infections. Test performance is not FDA approved in patients less than 25 years old. Performed at [facility], [address], [place], [place] [address]   Resp panel by RT-PCR (RSV, Flu A&B, Covid) Nasal Mucosa     Status: None   Collection Time: 05/22/24  3:37 PM   Specimen: Nasal Mucosa; Nasal Swab  Result Value Ref Range Status   SARS Coronavirus 2 by RT PCR NEGATIVE NEGATIVE Final    Comment: (NOTE) SARS-CoV-2 target nucleic acids are NOT DETECTED.  The SARS-CoV-2 RNA is generally detectable in upper respiratory specimens during the acute phase of infection. The lowest concentration of SARS-CoV-2 viral copies this assay can detect is 138 copies/mL. A negative result does not preclude SARS-Cov-2 infection and should not be used as the sole basis for treatment or other patient management decisions. A negative result  may occur with  improper specimen collection/handling, submission of specimen other than nasopharyngeal swab, presence of viral mutation(s) within the areas targeted by this assay, and inadequate number of viral copies(<138 copies/mL). A negative result must be combined with clinical observations, patient history, and epidemiological information. The expected result is Negative.  Fact Sheet for Patients:  BloggerCourse.com  Fact Sheet for Healthcare Providers:  SeriousBroker.it  This test is no t yet approved or cleared by the [place]  FDA and  has been authorized for detection and/or diagnosis of SARS-CoV-2 by FDA under an Emergency Use Authorization (EUA). This EUA will remain  in effect (meaning this test can be used) for the duration of the COVID-19 declaration under Section 564(b)(1) of the Act, 21 U.S.C.section 360bbb-3(b)(1), unless the authorization is terminated  or revoked sooner.       Influenza A by PCR NEGATIVE NEGATIVE Final   Influenza B by PCR NEGATIVE NEGATIVE Final    Comment: (NOTE) The Xpert Xpress SARS-CoV-2/FLU/RSV plus assay is intended as an aid in the diagnosis of influenza from Nasopharyngeal swab specimens and should not be used as a sole basis for treatment. Nasal washings and aspirates are unacceptable for Xpert Xpress SARS-CoV-2/FLU/RSV testing.  Fact Sheet for Patients: BloggerCourse.com  Fact Sheet for Healthcare Providers: SeriousBroker.it  This test is not yet approved or cleared by the [place]  FDA and has been authorized for detection and/or diagnosis of SARS-CoV-2 by FDA under an Emergency Use Authorization (EUA). This EUA will remain in effect (meaning this test can be used) for the duration of the COVID-19 declaration under Section 564(b)(1) of the Act, 21 U.S.C. section 360bbb-3(b)(1), unless the authorization is terminated  or revoked.     Resp Syncytial Virus by PCR NEGATIVE NEGATIVE Final    Comment: (NOTE) Fact Sheet for Patients: BloggerCourse.com  Fact Sheet for Healthcare Providers: SeriousBroker.it  This test is not yet approved or cleared by the [place]  FDA and has been authorized for detection and/or diagnosis of SARS-CoV-2 by FDA under an Emergency Use Authorization (EUA). This EUA will remain in effect (meaning this test can be used) for the duration of the COVID-19 declaration under Section 564(b)(1) of the Act, 21 U.S.C. section 360bbb-3(b)(1), unless the authorization is terminated or revoked.  Performed at [facility], [address], [place], [place] [address]      Scheduled Meds:  arformoterol   15 mcg Nebulization BID  aspirin  EC  81 mg Oral Daily   atorvastatin   40 mg Oral Daily   azithromycin   500 mg Oral Daily   budesonide  (PULMICORT ) nebulizer solution  0.5 mg Nebulization BID   Chlorhexidine  Gluconate Cloth  6 each Topical Q0600   clopidogrel   75 mg Oral Daily   cycloSPORINE   1 drop Both Eyes BID   enoxaparin  (LOVENOX ) injection  40 mg Subcutaneous Q24H   insulin  aspart  0-15 Units Subcutaneous TID WC   insulin  aspart  0-5 Units Subcutaneous QHS   insulin  aspart  4 Units Subcutaneous TID WC   insulin  glargine-yfgn  10 Units Subcutaneous Daily   ipratropium-albuterol   3 mL Nebulization Q6H   levothyroxine   50 mcg Oral Daily   methylPREDNISolone  (SOLU-MEDROL ) injection  60 mg Intravenous Q12H   mirabegron  ER  25 mg Oral Daily   pantoprazole   40 mg Oral Daily   pregabalin   150 mg Oral BID   senna  2 tablet Oral QHS   Continuous Infusions:  sodium chloride  10 mL/hr at 05/23/24 0407   ceFEPIme  (MAXIPIME ) 2 g in sodium chloride  0.9 % 100 mL IVPB 2 g (05/23/24 1138)   linezolid (ZYVOX) IV 600 mg (05/23/24 0955)   magnesium  sulfate bolus IVPB 2 g (05/23/24 1151)   norepinephrine  (LEVOPHED ) Adult infusion 2 mcg/min  (05/23/24 0915)    Procedures/Studies: CT Angio Chest Pulmonary Embolism (PE) W or WO Contrast Result Date: 05/22/2024 CLINICAL DATA:  Pulmonary embolism suspected, low to intermediate probability, negative D-dimer. Shortness of breath and respiratory failure. EXAM: CT ANGIOGRAPHY CHEST WITH CONTRAST TECHNIQUE: Multidetector CT imaging of the chest was performed using the standard protocol during bolus administration of intravenous contrast. Multiplanar CT image reconstructions and MIPs were obtained to evaluate the vascular anatomy. RADIATION DOSE REDUCTION: This exam was performed according to the departmental dose-optimization program which includes automated exposure control, adjustment of the mA and/or kV according to patient size and/or use of iterative reconstruction technique. CONTRAST:  75mL OMNIPAQUE  IOHEXOL  350 MG/ML SOLN COMPARISON:  04/24/2024. FINDINGS: Cardiovascular: The heart is borderline enlarged and there is no pericardial effusion. There are calcifications in the mitral valve annulus. There is mild atherosclerotic calcification of the aorta without evidence of aneurysm. The pulmonary trunk is normal in caliber. There is no evidence of pulmonary embolism. Mediastinum/Nodes: No mediastinal, hilar, or axillary lymphadenopathy. The trachea and esophagus are within normal limits. Lungs/Pleura: There are small bilateral pleural effusions, greater on the left than on the right. Patchy atelectasis and consolidation is noted in the lower lobes bilaterally. A few scattered ground-glass opacities are noted in the upper lobes bilaterally. No pneumothorax is seen. There is a 3 mm nodule in the posterior segment of the left upper lobe, axial image 64. Upper Abdomen: The gallbladder is surgically absent. There is stable nodularity of the left adrenal gland, previously characterized as adenoma is. No acute abnormality is seen. Musculoskeletal: Degenerative changes are present in the thoracic spine. No  acute osseous abnormality is seen. Review of the MIP images confirms the above findings. IMPRESSION: 1. No evidence of pulmonary embolism. 2. Scattered ground-glass opacities in the upper lobes bilaterally and patchy consolidation at the lung bases, suspicious for multifocal pneumonia, slightly increased at the left lung base. 3. Small bilateral pleural effusions. 4. 3 mm left upper lobe pulmonary nodule. No follow-up needed if patient is low-risk.This recommendation follows the consensus statement: Guidelines for Management of Incidental Pulmonary Nodules Detected on CT Images: From the Fleischner Society 2017; Radiology 2017; 284:228-243. 5. Aortic atherosclerosis.  Electronically Signed   By: Wyvonnia Heimlich M.D.   On: 05/22/2024 20:29   DG Chest Portable 1 View Result Date: 05/22/2024 CLINICAL DATA:  cough EXAM: PORTABLE CHEST - 1 VIEW COMPARISON:  May 10, 2024 FINDINGS: Low lung volumes. No focal airspace consolidation, pleural effusion, or pneumothorax. No cardiomegaly. Tortuous aorta with aortic atherosclerosis. No acute fracture or destructive lesion. Multilevel thoracic osteophytosis. Osteopenia. IMPRESSION: No acute cardiopulmonary abnormality. Electronically Signed   By: Rance Burrows M.D.   On: 05/22/2024 13:50   DG Chest Port 1 View Result Date: 05/10/2024 CLINICAL DATA:  Questionable sepsis EXAM: PORTABLE CHEST 1 VIEW COMPARISON:  Chest x-ray 04/26/2024.  Chest CT 04/24/2004. FINDINGS: The heart size and mediastinal contours are within normal limits. Both lungs are clear. The visualized skeletal structures are unremarkable. IMPRESSION: No active disease. Electronically Signed   By: Tyron Gallon M.D.   On: 05/10/2024 15:17   DG Swallowing Func-Speech Pathology Result Date: 04/29/2024 Table formatting from the original result was not included. Modified Barium Swallow Study Patient Details Name: STORMEE DUDA MRN: [id number] Date of Birth: 01/25/1941 Today's Date: 04/29/2024 HPI/PMH: HPI: SHAUNESSY DOBRATZ is a 83 y.o. female with medical history significant for diabetes, hypertension, stroke, pituitary adenoma, PSVT, anxiety and depression.  Patient was brought to the ED from her group facility with reports of confusion that started today, also with cough, fever.   At the time of my evaluation, patient is lethargic, responds to direction but unable to answer questions or give history. Recent hospitalization 3/17 to 3/25 for acute/early subacute stroke with concomitant acute metabolic encephalopathy.  Was discharged to SNF.     Patient has been admitted with septic shock secondary to multifocal pneumonia along with associated acute hypoxemic respiratory failure and acute metabolic encephalopathy. BSE requested. Clinical Impression: Patient presents with oropharyngeal swallowing to be essentially [facility]. Note one episode of flash frank penetration of thin liquids that was cleared during the swallow and occasional premature spillage of thin liquids penetrated before the swallow but was cleared during the swallow every time. Swallow is often triggered at the posterior angle of the ramus but sometimes is delayed until bolus fills the pyriform sinuses. Pharyngeal stripping wave is slightly diminished resulting in occasional trace pharyngeal residue. Note suspected small Zenker's Diverticulum (ZD) - no radiologist present to confirm. Residue in ZD with retrograde movement back up to the pyriforms. Repeat dry swallow clears retrograde residue from the pyriforms, however note it again collects in the ZD. No retrograde residue was penetrated or aspirated. Esophageal sweep was unremarkable. Recommend upgrade Pt's diet to D3/mech soft and continue with thin liquids. Recommend Pt utilize repeat swallow with every sip/bite to ensure trace/min retrograde residue is cleared from pharynx. Reviewed findings with patient and RN. There are no further ST needs noted at this time, our service will sign off. Factors that may increase  risk of adverse event in presence of aspiration Roderick Civatte & Jessy Morocco 2021): No data recorded Recommendations/Plan: Swallowing Evaluation Recommendations Swallowing Evaluation Recommendations Recommendations: PO diet PO Diet Recommendation: Dysphagia 3 (Mechanical soft); Thin liquids (Level 0) Liquid Administration via: Cup; Straw Medication Administration: Whole meds with liquid Supervision: Patient able to self-feed Swallowing strategies  : Slow rate; Small bites/sips; Multiple dry swallows after each bite/sip Postural changes: Position pt fully upright for meals Oral care recommendations: Oral care BID (2x/day) Treatment Plan Treatment Plan Treatment recommendations: Therapy as outlined in treatment plan below Follow-up recommendations: No SLP follow up Interventions: Aspiration precaution training Recommendations Recommendations for  follow up therapy are one component of a multi-disciplinary discharge planning process, led by the attending physician.  Recommendations may be updated based on patient status, additional functional criteria and insurance authorization. Assessment: Orofacial Exam: Orofacial Exam Oral Cavity: Oral Hygiene: WFL Oral Cavity - Dentition: Adequate natural dentition; Missing dentition Orofacial Anatomy: WFL Oral Motor/Sensory Function: WFL Anatomy: Anatomy: WFL Boluses Administered: Boluses Administered Boluses Administered: Thin liquids (Level 0); Mildly thick liquids (Level 2, nectar thick); Moderately thick liquids (Level 3, honey thick); Puree; Solid  Oral Impairment Domain: Oral Impairment Domain Lip Closure: No labial escape Tongue control during bolus hold: Not tested Bolus preparation/mastication: Timely and efficient chewing and mashing Bolus transport/lingual motion: Brisk tongue motion Oral residue: Complete oral clearance Initiation of pharyngeal swallow : Posterior angle of the ramus; Pyriform sinuses  Pharyngeal Impairment Domain: Pharyngeal Impairment Domain Soft palate  elevation: No bolus between soft palate (SP)/pharyngeal wall (PW) Laryngeal elevation: Complete superior movement of thyroid  cartilage with complete approximation of arytenoids to epiglottic petiole Anterior hyoid excursion: Complete anterior movement Epiglottic movement: Complete inversion Laryngeal vestibule closure: Complete, no air/contrast in laryngeal vestibule; Incomplete, narrow column air/contrast in laryngeal vestibule Pharyngeal stripping wave : Present - complete Pharyngeal contraction (A/P view only): N/A Pharyngoesophageal segment opening: Complete distension and complete duration, no obstruction of flow Tongue base retraction: No contrast between tongue base and posterior pharyngeal wall (PPW) Pharyngeal residue: Trace residue within or on pharyngeal structures Location of pharyngeal residue: Tongue base; Valleculae  Esophageal Impairment Domain: Esophageal Impairment Domain Esophageal clearance upright position: Esophageal retention with retrograde flow below pharyngoesophageal segment (PES) (small Zenker's Diverticulum) Pill: Pill Consistency administered: Thin liquids (Level 0) Thin liquids (Level 0): [facility] Penetration/Aspiration Scale Score: Penetration/Aspiration Scale Score 1.  Material does not enter airway: Thin liquids (Level 0); Mildly thick liquids (Level 2, nectar thick); Moderately thick liquids (Level 3, honey thick); Solid; Puree; Pill 2.  Material enters airway, remains ABOVE vocal cords then ejected out: Thin liquids (Level 0) Compensatory Strategies: Compensatory Strategies Compensatory strategies: Yes Multiple swallows: Effective   General Information: Caregiver present: No  Diet Prior to this Study: Dysphagia 2 (finely chopped); Thin liquids (Level 0)   Temperature : Normal   Respiratory Status: WFL   Supplemental O2: Nasal cannula   History of Recent Intubation: No  Behavior/Cognition: Alert; Cooperative; Pleasant mood; Confused Self-Feeding Abilities: Able to self-feed Baseline  vocal quality/speech: Normal Volitional Cough: Able to elicit Volitional Swallow: Able to elicit No data recorded Goal Planning: No data recorded No data recorded No data recorded No data recorded No data recorded Pain: Pain Assessment Pain Assessment: No/denies pain End of Session: Start Time:SLP Start Time (ACUTE ONLY): 1121 Stop Time: SLP Stop Time (ACUTE ONLY): 1154 Time Calculation:SLP Time Calculation (min) (ACUTE ONLY): 33 min Charges: SLP Evaluations $ SLP Speech Visit: 1 Visit SLP Evaluations $MBS Swallow: 1 Procedure SLP visit diagnosis: SLP Visit Diagnosis: Dysphagia, unspecified (R13.10) Past Medical History: Past Medical History: Diagnosis Date  Anxiety   Asthma   ASYMPTOMATIC POSTMENOPAUSAL STATUS 02/09/2009  Cataract   Constipation 03/19/2013  Depression   DIABETES MELLITUS, TYPE II 09/14/2007  HYPERCHOLESTEROLEMIA 02/09/2009  HYPERTENSION 02/09/2009  HYPOTHYROIDISM 09/14/2007  MIGRAINE HEADACHE 09/14/2007  Neuropathy   OSTEOPOROSIS 02/09/2009  PANCREATITIS 09/14/2007  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  PONV (postoperative nausea and vomiting)   Rectocele 03/19/2013  Shortness of breath   SUPERFICIAL PHLEBITIS 09/14/2007  Varicose veins  Past Surgical History: Past Surgical History: Procedure Laterality Date  ABDOMINAL HYSTERECTOMY    ANTERIOR AND POSTERIOR REPAIR N/A  04/02/2013  Procedure: ANTERIOR (CYSTOCELE) AND POSTERIOR REPAIR (RECTOCELE);  Surgeon: Albino Hum, MD;  Location: [facility];  Service: Gynecology;  Laterality: N/A;  APPENDECTOMY    BRAIN SURGERY    CATARACT EXTRACTION W/PHACO  12/05/2011  Procedure: CATARACT EXTRACTION PHACO AND INTRAOCULAR LENS PLACEMENT (IOC);  Surgeon: Anner Kill;  Location: [facility];  Service: Ophthalmology;  Laterality: Left;  CDE:9.65  CHOLECYSTECTOMY    CRANIOTOMY N/A 06/04/2020  Procedure: Endoscopic Transphenoidal Resection of Recurrent PituitaryTumor;  Surgeon: Elna Haggis, MD;  Location: [facility] OR;  Service: Neurosurgery;  Laterality: N/A;  CYSTOSCOPY    ENDOVENOUS ABLATION  SAPHENOUS VEIN W/ LASER  11-03-2011   right greater saphenous vein  left leg done 10-2011  EYE SURGERY  98  right cataract extraction 98  LAPAROSCOPIC NISSEN FUNDOPLICATION    NM ESOPHAGEAL REFLUX  08-11-11  PITUITARY EXCISION  10/1997  POSTERIOR REPAIR    TRANSNASAL APPROACH N/A 06/04/2020  Procedure: TRANSNASAL APPROACH;  Surgeon: Ammon Bales, MD;  Location: [facility];  Service: ENT;  Laterality: N/A;  TRANSPHENOIDAL / TRANSNASAL HYPOPHYSECTOMY / RESECTION PITUITARY TUMOR  08-11-11 Florina Husbands 04/29/2024, 12:42 PM  ECHOCARDIOGRAM COMPLETE Result Date: 04/27/2024    ECHOCARDIOGRAM REPORT   Patient Name:   ELISE KNOBLOCH Date of Exam: 04/27/2024 Medical Rec #:  [id number]      Height:       65.0 in Accession #:    [id number]     Weight:       125.4 lb Date of Birth:  July 12, 1941       BSA:          1.622 m Patient Age:    82 years       BP:           91/54 mmHg Patient Gender: F              HR:           99 bpm. Exam Location:  [facility] Procedure: 2D Echo, Color Doppler and Cardiac Doppler (Both Spectral and Color            Flow Doppler were utilized during procedure). Indications:    I50.31 Acute diastolic (congestive) heart failure  History:        Patient has prior history of Echocardiogram examinations, most                 recent 03/13/2024. Risk Factors:Hypertension, Diabetes and                 Dyslipidemia.  Sonographer:    Sherline Distel Senior RDCS Referring Phys: [id number] [facility] IMPRESSIONS  1. Left ventricular ejection fraction, by estimation, is 60 to 65%. The left ventricle has normal function. The left ventricle has no regional wall motion abnormalities. Left ventricular diastolic parameters are indeterminate.  2. Right ventricular systolic function is normal. The right ventricular size is normal.  3. Left atrial size was mildly dilated.  4. MVA by PT1/2 1.23 cm2 with men gradient 12 peak 19 mmHg at HR of 101 bpm. The mitral valve is abnormal. Mild mitral valve regurgitation. Moderate mitral  stenosis. Moderate mitral annular calcification.  5. The aortic valve is tricuspid. There is mild calcification of the aortic valve. There is mild thickening of the aortic valve. Aortic valve regurgitation is trivial. Aortic valve sclerosis is present, with no evidence of aortic valve stenosis.  6. The inferior vena cava is normal in size with greater than 50% respiratory variability, suggesting right atrial  pressure of 3 mmHg. FINDINGS  Left Ventricle: Left ventricular ejection fraction, by estimation, is 60 to 65%. The left ventricle has normal function. The left ventricle has no regional wall motion abnormalities. Strain was performed and the global longitudinal strain is indeterminate. The left ventricular internal cavity size was normal in size. There is no left ventricular hypertrophy. Left ventricular diastolic parameters are indeterminate. Right Ventricle: The right ventricular size is normal. No increase in right ventricular wall thickness. Right ventricular systolic function is normal. Left Atrium: Left atrial size was mildly dilated. Right Atrium: Right atrial size was normal in size. Pericardium: There is no evidence of pericardial effusion. Mitral Valve: MVA by PT1/2 1.23 cm2 with men gradient 12 peak 19 mmHg at HR of 101 bpm. The mitral valve is abnormal. There is moderate thickening of the mitral valve leaflet(s). There is moderate calcification of the mitral valve leaflet(s). Moderate mitral annular calcification. Mild mitral valve regurgitation. Moderate mitral valve stenosis. MV peak gradient, 19.1 mmHg. The mean mitral valve gradient is 12.7 mmHg. Tricuspid Valve: The tricuspid valve is normal in structure. Tricuspid valve regurgitation is trivial. No evidence of tricuspid stenosis. Aortic Valve: The aortic valve is tricuspid. There is mild calcification of the aortic valve. There is mild thickening of the aortic valve. Aortic valve regurgitation is trivial. Aortic valve sclerosis is present, with  no evidence of aortic valve stenosis. Pulmonic Valve: The pulmonic valve was normal in structure. Pulmonic valve regurgitation is not visualized. No evidence of pulmonic stenosis. Aorta: The aortic root is normal in size and structure. Venous: The inferior vena cava is normal in size with greater than 50% respiratory variability, suggesting right atrial pressure of 3 mmHg. IAS/Shunts: No atrial level shunt detected by color flow Doppler. Additional Comments: 3D was performed not requiring image post processing on an independent workstation and was indeterminate.  LEFT VENTRICLE PLAX 2D LVIDd:         3.70 cm LVIDs:         2.90 cm LV PW:         0.70 cm LV IVS:        0.80 cm LVOT diam:     1.80 cm LV SV:         55 LV SV Index:   34 LVOT Area:     2.54 cm  RIGHT VENTRICLE RV S prime:     13.80 cm/s TAPSE (M-mode): 1.8 cm LEFT ATRIUM             Index        RIGHT ATRIUM           Index LA diam:        3.80 cm 2.34 cm/m   RA Area:     13.30 cm LA Vol (A2C):   60.3 ml 37.17 ml/m  RA Volume:   28.90 ml  17.81 ml/m LA Vol (A4C):   45.0 ml 27.74 ml/m LA Biplane Vol: 55.6 ml 34.27 ml/m  AORTIC VALVE LVOT Vmax:   115.00 cm/s LVOT Vmean:  76.200 cm/s LVOT VTI:    0.216 m  AORTA Ao Root diam: 3.00 cm Ao Asc diam:  3.20 cm MITRAL VALVE MV Area VTI:  1.15 cm    SHUNTS MV Peak grad: 19.1 mmHg   Systemic VTI:  0.22 m MV Mean grad: 12.7 mmHg   Systemic Diam: 1.80 cm MV Vmax:      2.18 m/s MV Vmean:     172.3 cm/s Janelle Mediate MD Electronically signed  by Janelle Mediate MD Signature Date/Time: 04/27/2024/2:17:05 PM    Final    DG CHEST PORT 1 VIEW Result Date: 04/26/2024 CLINICAL DATA:  Hypoxemia. EXAM: PORTABLE CHEST 1 VIEW COMPARISON:  Chest radiograph dated 04/24/2024. FINDINGS: Diffuse interstitial and interlobular septal prominence and edema. Small bilateral pleural effusions. No pneumothorax. The cardiac silhouette is within limits. Atherosclerotic calcification of the aorta. No acute osseous pathology. IMPRESSION:  Pulmonary edema with small bilateral pleural effusions. Electronically Signed   By: Angus Bark M.D.   On: 04/26/2024 13:25   CT Head Wo Contrast Result Date: 04/24/2024 CLINICAL DATA:  ams EXAM: CT HEAD WITHOUT CONTRAST TECHNIQUE: Contiguous axial images were obtained from the base of the skull through the vertex without intravenous contrast. RADIATION DOSE REDUCTION: This exam was performed according to the departmental dose-optimization program which includes automated exposure control, adjustment of the mA and/or kV according to patient size and/or use of iterative reconstruction technique. COMPARISON:  CT scan head from 03/14/2024. FINDINGS: Brain: No evidence of acute infarction, hemorrhage, hydrocephalus, extra-axial collection or mass lesion/mass effect. There is bilateral periventricular hypodensity, which is non-specific but most likely seen in the settings of microvascular ischemic changes. Moderate in extent. 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 Otherwise normal appearance of brain parenchyma. Redemonstration of patient's known sellar/suprasellar mass. No significant interval change. Ventricles are normal. Cerebral volume is age appropriate. Vascular: No hyperdense vessel or unexpected calcification. Intracranial arteriosclerosis. Skull: Normal. Negative for fracture or focal lesion. Sinuses/Orbits: No acute finding. Other: Visualized mastoid air cells are unremarkable. No mastoid effusion. IMPRESSION: *No acute intracranial abnormality. No significant interval change. Electronically Signed   By: Beula Brunswick M.D.   On: 04/24/2024 14:00   CT Chest Wo Contrast Result Date: 04/24/2024 CLINICAL DATA:  cf pna.  Increased altered mental status. EXAM: CT CHEST WITHOUT CONTRAST TECHNIQUE: Multidetector CT imaging of the chest was performed following the standard protocol without IV contrast. RADIATION DOSE REDUCTION: This exam was performed according to the departmental  dose-optimization program which includes automated exposure control, adjustment of the mA and/or kV according to patient size and/or use of iterative reconstruction technique. COMPARISON:  CT scan chest from 12/25/2023. FINDINGS: Cardiovascular: Normal cardiac size. No pericardial effusion. No aortic aneurysm. There are coronary artery calcifications, in keeping with coronary artery disease. There are also mild peripheral atherosclerotic vascular calcifications of thoracic aorta and its major branches. Mediastinum/Nodes: Visualized thyroid  gland appears grossly unremarkable. No solid / cystic mediastinal masses. The esophagus is nondistended precluding optimal assessment. There are few enlarged mediastinal lymph nodes, which appear grossly similar to the prior study and favored benign/reactive in the given clinical setting. No axillary lymphadenopathy by size criteria. Evaluation of bilateral hila is limited due to lack on intravenous contrast: however, no large hilar lymphadenopathy identified. Lungs/Pleura: The central tracheo-bronchial tree is patent. There are heterogeneous solid and ground-glass opacities throughout left lung and patchy opacities in the right lung lower lobe, compatible with multilobar pneumonia. Follow-up to clearing is recommended. There is no associated lung mass, pleural effusion or pneumothorax. No suspicious lung nodule. Upper Abdomen: Surgically absent gallbladder. Stable left adrenal adenomas. Remaining visualized upper abdominal viscera within normal limits. Musculoskeletal: The visualized soft tissues of the chest wall are grossly unremarkable. No suspicious osseous lesions. There are mild to moderate multilevel degenerative changes in the visualized spine. IMPRESSION: 1. There are heterogeneous, solid and ground-glass opacities throughout left lung and patchy opacities in the right lung lower lobe, compatible with multilobar pneumonia. Follow-up to clearing is recommended. 2.  Multiple other nonacute observations, as described above. Aortic Atherosclerosis (ICD10-I70.0). Electronically Signed   By: Beula Brunswick M.D.   On: 04/24/2024 13:56   DG Chest Portable 1 View Result Date: 04/24/2024 CLINICAL DATA:  Fever and cough. EXAM: PORTABLE CHEST 1 VIEW COMPARISON:  Chest radiograph dated 03/11/2024. FINDINGS: Diffuse interstitial nodular density throughout the left lung concerning for pneumonia, possibly atypical in etiology. Aspiration is not excluded. No consolidative changes. There is no pleural effusion pneumothorax. The cardiac silhouette is within limits. Atherosclerotic calcification of the aorta. No acute osseous pathology. IMPRESSION: Diffuse interstitial nodular density throughout the left lung concerning for pneumonia. Electronically Signed   By: Angus Bark M.D.   On: 04/24/2024 11:40    Demaris Fillers, DO  [facility]  If 7PM-7AM, please contact night-coverage [email or web address] Password [email or web address] 05/23/2024, 12:32 PM   LOS: 1 day

## 2024-05-23 NOTE — TOC Initial Note (Signed)
 Transition of Care [facility]) - Initial/Assessment Note    Patient Details  Name: [name] White MRN: [id number] Date of Birth: 05-Dec-1941  Transition of Care [facility]) CM/SW Contact:    Grandville Lax, LCSWA Phone Number: 05/23/2024, 10:20 AM  Clinical Narrative:                 Pt is high risk for readmission. Pt well known to TOC from previous admissions. Pt arrived to hospital from [facility]. CSW spoke to [place] who states that pt is still getting [facility] PT/RN services through [place]. There have been no other changes since most recent hospital admission. TOC to follow.   Expected Discharge Plan: Assisted Living Barriers to Discharge: Continued Medical Work up   Patient Goals and CMS Choice Patient states their goals for this hospitalization and ongoing recovery are:: return to ALF CMS Medicare.gov Compare Post Acute Care list provided to:: Patient Represenative (must comment) Choice offered to / list presented to : Adult Children      Expected Discharge Plan and Services In-house Referral: Clinical Social Work Discharge Planning Services: CM Consult Post Acute Care Choice: Home Health Living arrangements for the past 2 months: Assisted Living Facility                           HH Arranged: RN, PT [facility] Agency: [facility] Date [facility] Agency Contacted: 05/23/24   Representative spoke with at [facility] Agency: Sarah  Prior Living Arrangements/Services Living arrangements for the past 2 months: Assisted Living Facility Lives with:: Facility Resident Patient language and need for interpreter reviewed:: Yes Do you feel safe going back to the place where you live?: Yes      Need for Family Participation in Patient Care: Yes (Comment) Care giver support system in place?: Yes (comment) Current home services: DME Criminal Activity/Legal Involvement Pertinent to Current Situation/Hospitalization: No - Comment as needed  Activities of Daily Living   ADL Screening (condition at  time of admission) Independently performs ADLs?: No Does the patient have a NEW difficulty with bathing/dressing/toileting/self-feeding that is expected to last >3 days?: Yes (Initiates electronic notice to provider for possible OT consult) Does the patient have a NEW difficulty with getting in/out of bed, walking, or climbing stairs that is expected to last >3 days?: Yes (Initiates electronic notice to provider for possible PT consult) Does the patient have a NEW difficulty with communication that is expected to last >3 days?: No Is the patient deaf or have difficulty hearing?: No Does the patient have difficulty seeing, even when wearing glasses/contacts?: No Does the patient have difficulty concentrating, remembering, or making decisions?: Yes  Permission Sought/Granted                  Emotional Assessment         Alcohol / Substance Use: Not Applicable Psych Involvement: No (comment)  Admission diagnosis:  Sepsis due to undetermined organism (HCC) [A41.9] Sepsis, due to unspecified organism, unspecified whether acute organ dysfunction present [facility]) [A41.9] Patient Active Problem List   Diagnosis Date Noted   Sepsis due to undetermined organism (HCC) 05/22/2024   Severe sepsis (HCC) 05/10/2024   Acute on chronic respiratory failure with hypoxia (HCC) 05/10/2024   Hypomagnesemia 05/01/2024   Septic shock (HCC) 04/24/2024   CAP (community acquired pneumonia) 04/24/2024   Acute respiratory failure with hypoxia (HCC) 04/24/2024   CVA (cerebral vascular accident) (HCC) 03/12/2024   Acute lower UTI 03/12/2024  Type 2 diabetes mellitus with peripheral neuropathy (HCC) 03/12/2024   Anxiety and depression 03/12/2024   GERD without esophagitis 03/12/2024   Acute encephalopathy 03/11/2024   Rhabdomyolysis 12/27/2023   Pressure injury of skin 12/27/2023   UTI (urinary tract infection) 12/26/2023   Sepsis due to urinary tract infection (HCC) 12/26/2023   Acute metabolic  encephalopathy 12/26/2023   Status post transsphenoidal pituitary resection (HCC) 06/04/2020   Pituitary adenoma with extrasellar extension (HCC) 06/04/2020   Pituitary tumor 05/29/2020   Dizzinesses 02/02/2020   Acute pain of right shoulder 02/02/2020   Encounter for screening mammogram for malignant neoplasm of breast 01/28/2020   Vitamin D  deficiency 01/28/2020   Paroxysmal supraventricular tachycardia (HCC) 06/19/2014   Rectocele 03/19/2013   Constipation 03/19/2013   HYPERCHOLESTEROLEMIA 02/09/2009   Essential hypertension 02/09/2009   Osteoporosis 02/09/2009   Post-menopausal 02/09/2009   HEAT INTOLERANCE 02/01/2008   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   Hypothyroidism 09/14/2007   Diabetes (HCC) 09/14/2007   Migraine headache 09/14/2007   SUPERFICIAL PHLEBITIS 09/14/2007   PANCREATITIS 09/14/2007   MENOPAUSAL SYNDROME 09/14/2007   FATIGUE 09/14/2007   PCP:  Omie Bickers, MD Pharmacy:   [facility], [place] - [address] [place] [address] Phone: [phone] Fax: [phone]     Social Drivers of Health (SDOH) Social History: SDOH Screenings   Food Insecurity: No Food Insecurity (05/22/2024)  Housing: Low Risk  (05/22/2024)  Recent Concern: Housing - High Risk (05/10/2024)  Transportation Needs: No Transportation Needs (05/22/2024)  Utilities: Not At Risk (05/22/2024)  Depression (PHQ2-9): Low Risk  (01/28/2020)  Social Connections: Socially Isolated (05/22/2024)  Tobacco Use: Low Risk  (05/22/2024)   SDOH Interventions:     Readmission Risk Interventions    05/23/2024   10:18 AM 04/25/2024    1:46 PM 12/27/2023    1:21 PM  Readmission Risk Prevention Plan  Transportation Screening Complete Complete Complete  PCP or Specialist Appt within 3-5 Days  Not Complete   Home Care Screening   Complete  Medication Review (RN CM)   Complete  HRI or Home Care Consult Complete Complete   Social Work Consult for Recovery Care Planning/Counseling  Complete Complete   Palliative Care Screening Not Applicable Not Complete   Medication Review Oceanographer) Complete Complete

## 2024-05-23 NOTE — Inpatient Diabetes Management (Signed)
 Inpatient Diabetes Program Recommendations  AACE/ADA: New Consensus Statement on Inpatient Glycemic Control  Target Ranges:  Prepandial:   less than 140 mg/dL      Peak postprandial:   less than 180 mg/dL (1-2 hours)      Critically ill patients:  140 - 180 mg/dL    Latest Reference Range & Units 05/23/24 05:14 05/23/24 07:24  Glucose-Capillary 70 - 99 mg/dL 284 (H) 132 (H)    Latest Reference Range & Units 05/22/24 11:34 05/23/24 04:11  Glucose 70 - 99 mg/dL 440 (H) 102 (H)    Latest Reference Range & Units 12/26/23 01:21 04/25/24 04:29  Hemoglobin A1C 4.8 - 5.6 % 6.0 (H) 7.0 (H)   Review of Glycemic Control  Diabetes history: DM2 Outpatient Diabetes medications: Metformin  500 mg BID Current orders for Inpatient glycemic control: Novolog  0-15 units TID with meals, Novolog  0-5 units at bedtime, Novolog  4 units TID with meals; Solumedrol 60 mg Q12H  Inpatient Diabetes Program Recommendations:    Insulin : If steroids are continued as ordered, please consider ordering Semglee  10 units Q24H.  NOTE: Patient admitted from SNF with severe sepsis, UTI, acute respiratory failure, and COPDE. Initial lab glucose 388 mg/dl on 07/20/35. Patient was recently inpatient 05/10/24-05/15/24 and was discharged on Prednisone  40 mg daily x5 days. Noted glucose 434 mg/dl this morning at 6:44 am. If steroids are continued, would recommend adding low dose basal insulin .  Thanks, Beacher Limerick, RN, MSN, CDCES Diabetes Coordinator Inpatient Diabetes Program [phone] (Team Pager from 8am to 5pm)

## 2024-05-23 NOTE — Plan of Care (Signed)
  Problem: Acute Rehab PT Goals(only PT should resolve) Goal: Pt Will Go Supine/Side To Sit Outcome: Progressing Flowsheets (Taken 05/23/2024 1545) Pt will go Supine/Side to Sit:  with contact guard assist  with minimal assist Goal: Patient Will Transfer Sit To/From Stand Outcome: Progressing Flowsheets (Taken 05/23/2024 1545) Patient will transfer sit to/from stand:  with contact guard assist  with minimal assist Goal: Pt Will Transfer Bed To Chair/Chair To Bed Outcome: Progressing Flowsheets (Taken 05/23/2024 1545) Pt will Transfer Bed to Chair/Chair to Bed:  with contact guard assist  with min assist Goal: Pt Will Ambulate Outcome: Progressing Flowsheets (Taken 05/23/2024 1545) Pt will Ambulate:  25 feet  with minimal assist  with rolling walker   3:46 PM, 05/23/24 Walton Guppy, MPT Physical Therapist with [facility] [phone] office [phone] mobile phone

## 2024-05-24 DIAGNOSIS — N3000 Acute cystitis without hematuria: Secondary | ICD-10-CM | POA: Diagnosis not present

## 2024-05-24 DIAGNOSIS — A4181 Sepsis due to Enterococcus: Secondary | ICD-10-CM

## 2024-05-24 DIAGNOSIS — J690 Pneumonitis due to inhalation of food and vomit: Secondary | ICD-10-CM | POA: Diagnosis not present

## 2024-05-24 DIAGNOSIS — A419 Sepsis, unspecified organism: Secondary | ICD-10-CM | POA: Diagnosis not present

## 2024-05-24 LAB — GLUCOSE, CAPILLARY
Glucose-Capillary: 125 mg/dL — ABNORMAL HIGH (ref 70–99)
Glucose-Capillary: 126 mg/dL — ABNORMAL HIGH (ref 70–99)
Glucose-Capillary: 143 mg/dL — ABNORMAL HIGH (ref 70–99)
Glucose-Capillary: 215 mg/dL — ABNORMAL HIGH (ref 70–99)
Glucose-Capillary: 217 mg/dL — ABNORMAL HIGH (ref 70–99)
Glucose-Capillary: 223 mg/dL — ABNORMAL HIGH (ref 70–99)
Glucose-Capillary: 261 mg/dL — ABNORMAL HIGH (ref 70–99)
Glucose-Capillary: 373 mg/dL — ABNORMAL HIGH (ref 70–99)
Glucose-Capillary: 405 mg/dL — ABNORMAL HIGH (ref 70–99)
Glucose-Capillary: 408 mg/dL — ABNORMAL HIGH (ref 70–99)
Glucose-Capillary: 492 mg/dL — ABNORMAL HIGH (ref 70–99)
Glucose-Capillary: 549 mg/dL (ref 70–99)
Glucose-Capillary: 561 mg/dL (ref 70–99)
Glucose-Capillary: 83 mg/dL (ref 70–99)
Glucose-Capillary: 84 mg/dL (ref 70–99)

## 2024-05-24 LAB — URINE CULTURE: Culture: >=100000 — AB

## 2024-05-24 LAB — BASIC METABOLIC PANEL WITH GFR
Anion gap: 10 (ref 5–15)
Anion gap: 10 (ref 5–15)
Anion gap: 9 (ref 5–15)
BUN: 27 mg/dL — ABNORMAL HIGH (ref 8–23)
BUN: 25 mg/dL — ABNORMAL HIGH (ref 8–23)
BUN: 23 mg/dL (ref 8–23)
CO2: 25 mmol/L (ref 22–32)
CO2: 25 mmol/L (ref 22–32)
CO2: 27 mmol/L (ref 22–32)
Calcium: 8.3 mg/dL — ABNORMAL LOW (ref 8.9–10.3)
Calcium: 8.2 mg/dL — ABNORMAL LOW (ref 8.9–10.3)
Calcium: 8.1 mg/dL — ABNORMAL LOW (ref 8.9–10.3)
Chloride: 98 mmol/L (ref 98–111)
Chloride: 100 mmol/L (ref 98–111)
Chloride: 100 mmol/L (ref 98–111)
Creatinine, Ser: 0.71 mg/dL (ref 0.44–1.00)
Creatinine, Ser: 0.71 mg/dL (ref 0.44–1.00)
Creatinine, Ser: 0.64 mg/dL (ref 0.44–1.00)
GFR, Estimated: >60 mL/min (ref >60)
GFR, Estimated: >60 mL/min (ref >60)
GFR, Estimated: >60 mL/min (ref >60)
Glucose, Bld: 171 mg/dL — ABNORMAL HIGH (ref 70–99)
Glucose, Bld: 216 mg/dL — ABNORMAL HIGH (ref 70–99)
Glucose, Bld: 98 mg/dL (ref 70–99)
Potassium: 3.8 mmol/L (ref 3.5–5.1)
Potassium: 3.7 mmol/L (ref 3.5–5.1)
Potassium: 3.6 mmol/L (ref 3.5–5.1)
Sodium: 133 mmol/L — ABNORMAL LOW (ref 135–145)
Sodium: 135 mmol/L (ref 135–145)
Sodium: 136 mmol/L (ref 135–145)

## 2024-05-24 LAB — CBC
HCT: 41.9 % (ref 36.0–46.0)
Hemoglobin: 13.5 g/dL (ref 12.0–15.0)
MCH: 29.5 pg (ref 26.0–34.0)
MCHC: 32.2 g/dL (ref 30.0–36.0)
MCV: 91.7 fL (ref 80.0–100.0)
Platelets: 278 10*3/uL (ref 150–400)
RBC: 4.57 MIL/uL (ref 3.87–5.11)
RDW: 17.4 % — ABNORMAL HIGH (ref 11.5–15.5)
WBC: 28.1 10*3/uL — ABNORMAL HIGH (ref 4.0–10.5)
nRBC: 0 % (ref 0.0–0.2)

## 2024-05-24 LAB — MAGNESIUM: Magnesium: 1.9 mg/dL (ref 1.7–2.4)

## 2024-05-24 MED ORDER — INSULIN GLARGINE-YFGN 100 UNIT/ML ~~LOC~~ SOLN
10.0000 [IU] | Freq: Every day | SUBCUTANEOUS | Status: DC
  Administered 2024-05-24: 10 [IU] via SUBCUTANEOUS
  Filled 2024-05-24 (×2): qty 0.1

## 2024-05-24 MED ORDER — INSULIN GLARGINE-YFGN 100 UNIT/ML ~~LOC~~ SOLN
10.0000 [IU] | Freq: Two times a day (BID) | SUBCUTANEOUS | Status: DC
  Administered 2024-05-24 – 2024-05-25 (×2): 10 [IU] via SUBCUTANEOUS
  Filled 2024-05-24 (×4): qty 0.1

## 2024-05-24 MED ORDER — MIDODRINE HCL 5 MG PO TABS
10.0000 mg | ORAL_TABLET | Freq: Three times a day (TID) | ORAL | Status: DC
  Administered 2024-05-24 – 2024-05-27 (×9): 10 mg via ORAL
  Filled 2024-05-24 (×9): qty 2

## 2024-05-24 MED ORDER — MIDODRINE HCL 5 MG PO TABS: 5.0000 mg | ORAL_TABLET | Freq: Three times a day (TID) | ORAL | Status: DC

## 2024-05-24 MED ORDER — FUROSEMIDE 40 MG PO TABS
40.0000 mg | ORAL_TABLET | Freq: Every day | ORAL | Status: DC
  Administered 2024-05-24 – 2024-05-26 (×3): 40 mg via ORAL
  Filled 2024-05-24 (×3): qty 1

## 2024-05-24 MED ORDER — INSULIN ASPART 100 UNIT/ML IJ SOLN
0.0000 [IU] | INTRAMUSCULAR | Status: DC
  Administered 2024-05-24 – 2024-05-25 (×4): 9 [IU] via SUBCUTANEOUS
  Administered 2024-05-25: 7 [IU] via SUBCUTANEOUS
  Administered 2024-05-25: 2 [IU] via SUBCUTANEOUS
  Administered 2024-05-25: 3 [IU] via SUBCUTANEOUS
  Administered 2024-05-26: 1 [IU] via SUBCUTANEOUS
  Administered 2024-05-26 (×2): 7 [IU] via SUBCUTANEOUS
  Administered 2024-05-26: 9 [IU] via SUBCUTANEOUS
  Administered 2024-05-27 (×2): 7 [IU] via SUBCUTANEOUS
  Administered 2024-05-27: 5 [IU] via SUBCUTANEOUS
  Administered 2024-05-27: 2 [IU] via SUBCUTANEOUS
  Administered 2024-05-27: 9 [IU] via SUBCUTANEOUS
  Administered 2024-05-28: 5 [IU] via SUBCUTANEOUS

## 2024-05-24 MED ORDER — INSULIN ASPART 100 UNIT/ML IJ SOLN
18.0000 [IU] | Freq: Once | INTRAMUSCULAR | Status: AC
  Administered 2024-05-24: 18 [IU] via SUBCUTANEOUS

## 2024-05-24 NOTE — Progress Notes (Signed)
 Speech Language Pathology Treatment: Dysphagia  Patient Details Name: KASSONDRA GEIL MRN: [id number] DOB: 1941-05-11 Today's Date: 05/24/2024 Time: 4098-1191 SLP Time Calculation (min) (ACUTE ONLY): 25 min  Assessment / Plan / Recommendation Clinical Impression  Ongoing diagnostic dysphagia therapy provided while Pt was sitting upright in chair. Pt consumed limited trials of thin liquids with SLP during treatment session without coughing. Note some wet vocal quality and occasional coughing seemingly related baseline congestion/fluid rather than swallowing. As Noted on BSE: Pt is at risk for aspiration with poor trunk support, AMS, compromised respiratory status, and current bedbound status. Reminded Pt of Zenker's Diverticulum noted on recent MBSS with retrograde movement back into the pyriforms and encouraged/educated Pt that she should produce additional dry swallows with all bites/sips. Continue to recommend D3/mech soft and thin liquids from an oropharyngeal standpoint. Our service will continue to follow acutely. Thank you,   HPI HPI: 83 year old female with a history of diabetes mellitus type 2, stroke, hypertension, COPD, hypothyroidism, hyperlipidemia, anxiety, PSVT presenting with coughing, chest congestion, shortness of breath.  The patient denies any fevers, chills, chest pain, nausea, vomiting or diarrhea.  She denies any hemoptysis.  She was recently admitted to the hospital from 05/10/2024 to 05/15/2024.  During that hospitalization, the patient was treated for COPD exacerbation and what was felt to be atypical pneumonia.  She was discharged with a prednisone  taper.  In addition, the patient was also admitted from 04/24/2024 to 05/01/2024 with septic shock secondary pneumonia.  She has had increasing generalized weakness since discharge from the hospital.  There is no hematochezia or melena.  She does complain of some dysuria for the past week.  In the ED, the patient was afebrile with soft blood  pressures in the low 90s and upper 80s.  WBC 17.1, hemoglobin 12.5, platelet 236.  Sodium 130, potassium 4.2, bicarbonate 26, serum creatinine 0.82.  LFTs were unremarkable.  Chest x-ray showed chronic interstitial markings.  Lactic acid 2.8.  The patient was started on linezolid and cefepime . Pt had MBSS 04/29/2024 with recommendation for D3/thin and evidence of Zenker's diverticulum. BSE requested.      SLP Plan  Continue with current plan of care      Recommendations for follow up therapy are one component of a multi-disciplinary discharge planning process, led by the attending physician.  Recommendations may be updated based on patient status, additional functional criteria and insurance authorization.    Recommendations  Diet recommendations: Thin liquid;Dysphagia 3 (mechanical soft) Liquids provided via: Cup;Straw Medication Administration: Whole meds with liquid Supervision: Patient able to self feed;Staff to assist with self feeding;Intermittent supervision to cue for compensatory strategies Compensations: Slow rate;Small sips/bites;Multiple dry swallows after each bite/sip Postural Changes and/or Swallow Maneuvers: Seated upright 90 degrees                  Oral care BID;Staff/trained caregiver to provide oral care     Dysphagia, unspecified (R13.10)     Continue with current plan of care    Naira Standiford H. Vergil Glasser, CCC-SLP Speech Language Pathologist  Florina Husbands  05/24/2024, 11:55 AM

## 2024-05-24 NOTE — Progress Notes (Signed)
 Physical Therapy Treatment Patient Details Name: [name] White MRN: [id number] DOB: 1941/07/28 Today's Date: 05/24/2024   History of Present Illness [name] White is a 83 year old female with a history of diabetes mellitus type 2, stroke, hypertension, COPD, hypothyroidism, hyperlipidemia, anxiety, PSVT presenting with coughing, chest congestion, shortness of breath.  The patient denies any fevers, chills, chest pain, nausea, vomiting or diarrhea.  She denies any hemoptysis.  She was recently admitted to the hospital from 05/10/2024 to 05/15/2024.  During that hospitalization, the patient was treated for COPD exacerbation and what was felt to be atypical pneumonia.  She was discharged with a prednisone  taper.  In addition, the patient was also admitted from 04/24/2024 to 05/01/2024 with septic shock secondary pneumonia.  She has had increasing generalized weakness since discharge from the hospital.  There is no hematochezia or melena.  She does complain of some dysuria for the past week.    PT Comments  Patient requiring increased time with labored movement for sitting up at bedside, once seated demonstrated fair/good return for completing BLE exercises with verbal cueing, had difficulty completing sit to stands due to BLE weakness and tolerate taking a few steps forward/backwards at bedside before having to sit due to weakness/fatigue. Patient tolerated sitting up in chair after therapy with son present. Patient will benefit from continued skilled physical therapy in hospital and recommended venue below to increase strength, balance, endurance for safe ADLs and gait.      If plan is discharge home, recommend the following: A lot of help with bathing/dressing/bathroom;A lot of help with walking and/or transfers;Help with stairs or ramp for entrance;Assistance with cooking/housework   Can travel by private vehicle     Yes  Equipment Recommendations  None recommended by PT    Recommendations for Other  Services       Precautions / Restrictions Precautions Precautions: Fall Recall of Precautions/Restrictions: Intact Restrictions Weight Bearing Restrictions Per Provider Order: No     Mobility  Bed Mobility Overal bed mobility: Needs Assistance Bed Mobility: Supine to Sit     Supine to sit: Min assist, HOB elevated     General bed mobility comments: increased time, labored movement    Transfers Overall transfer level: Needs assistance Equipment used: Rolling walker (2 wheels) Transfers: Sit to/from Stand, Bed to chair/wheelchair/BSC Sit to Stand: Min assist, Mod assist   Step pivot transfers: Mod assist       General transfer comment: poor standing balance due to BLE weakness    Ambulation/Gait Ambulation/Gait assistance: Mod assist Gait Distance (Feet): 8 Feet Assistive device: Rolling walker (2 wheels) Gait Pattern/deviations: Decreased step length - right, Decreased step length - left, Decreased stride length Gait velocity: slow     General Gait Details: tolerated taking steps forward/backwards at bedside before having to sit due to fatigue and generalized weakness   Stairs             Wheelchair Mobility     Tilt Bed    Modified Rankin (Stroke Patients Only)       Balance Overall balance assessment: Needs assistance Sitting-balance support: Feet supported, No upper extremity supported Sitting balance-Leahy Scale: Fair Sitting balance - Comments: fair/good seated at EOB   Standing balance support: Reliant on assistive device for balance, During functional activity, Bilateral upper extremity supported Standing balance-Leahy Scale: Poor Standing balance comment: fair/poor using RW  Communication Communication Communication: No apparent difficulties  Cognition Arousal: Alert Behavior During Therapy: WFL for tasks assessed/performed   PT - Cognitive impairments: No apparent impairments                          Following commands: Intact      Cueing Cueing Techniques: Verbal cues, Tactile cues  Exercises General Exercises - Lower Extremity Long Arc Quad: AROM, Strengthening, Both, 10 reps, Seated Hip Flexion/Marching: Seated, AROM, Strengthening, Both, 10 reps Toe Raises: Seated, AROM, Strengthening, Both, 10 reps Heel Raises: Seated, AROM, Strengthening, Both, 10 reps    General Comments        Pertinent Vitals/Pain Pain Assessment Pain Assessment: No/denies pain    Home Living                          Prior Function            PT Goals (current goals can now be found in the care plan section) Acute Rehab PT Goals Patient Stated Goal: Return to ALF PT Goal Formulation: With patient/family Time For Goal Achievement: 06/06/24 Potential to Achieve Goals: Good Progress towards PT goals: Progressing toward goals    Frequency    Min 3X/week      PT Plan      Co-evaluation              AM-PAC PT "6 Clicks" Mobility   Outcome Measure  Help needed turning from your back to your side while in a flat bed without using bedrails?: A Little Help needed moving from lying on your back to sitting on the side of a flat bed without using bedrails?: A Lot Help needed moving to and from a bed to a chair (including a wheelchair)?: A Lot Help needed standing up from a chair using your arms (e.g., wheelchair or bedside chair)?: A Lot Help needed to walk in hospital room?: A Lot Help needed climbing 3-5 steps with a railing? : A Lot 6 Click Score: 13    End of Session   Activity Tolerance: Patient tolerated treatment well;Patient limited by fatigue Patient left: in chair;with call bell/phone within reach;with family/visitor present;with chair alarm set Nurse Communication: Mobility status PT Visit Diagnosis: Unsteadiness on feet (R26.81);Other abnormalities of gait and mobility (R26.89);Muscle weakness (generalized) (M62.81)     Time:  6644-0347 PT Time Calculation (min) (ACUTE ONLY): 24 min  Charges:    $Therapeutic Exercise: 8-22 mins $Therapeutic Activity: 8-22 mins PT General Charges $$ ACUTE PT VISIT: 1 Visit                     1:55 PM, 05/24/24 Walton Guppy, MPT Physical Therapist with [facility] [phone] office [phone] mobile phone

## 2024-05-24 NOTE — Inpatient Diabetes Management (Addendum)
 Inpatient Diabetes Program Recommendations  AACE/ADA: New Consensus Statement on Inpatient Glycemic Control  Target Ranges:  Prepandial:   less than 140 mg/dL      Peak postprandial:   less than 180 mg/dL (1-2 hours)      Critically ill patients:  140 - 180 mg/dL    Latest Reference Range & Units 05/24/24 02:03 05/24/24 03:06 05/24/24 04:04 05/24/24 05:07 05/24/24 06:03 05/24/24 07:40  Glucose-Capillary 70 - 99 mg/dL 696 (H) 295 (H) 284 (H) 261 (H) 223 (H) 125 (H)    Latest Reference Range & Units 05/23/24 16:49  CO2 22 - 32 mmol/L 21 (L)  Glucose 70 - 99 mg/dL 132 (HH)  Anion gap 5 - 15  16 (H)   Review of Glycemic Control  Diabetes history: DM2 Outpatient Diabetes medications: Metformin  500 mg BID Current orders for Inpatient glycemic control: IV insulin ; Solumedrol 60 mg daily  Inpatient Diabetes Program Recommendations:    Insulin : At time of transition from IV to SQ insulin , if steroids are continued as ordered please consider ordering Semglee  10 units Q24H, CBGs Q4H, Novolog  0-9 units Q4H.  NOTE: Patient admitted from SNF with severe sepsis, UTI, PNA, COPDE and ordered steroids which likely cause of hyperglycemia (lab glucose 687 mg/dl at 44:01 on 0/27/25). Patient was started on IV insulin  at 18:19 on 05/23/24.    Thanks, Beacher Limerick, RN, MSN, CDCES Diabetes Coordinator Inpatient Diabetes Program [phone] (Team Pager from 8am to 5pm)

## 2024-05-24 NOTE — Progress Notes (Signed)
 PROGRESS NOTE  SANTOS HARDWICK UXL:[id number] DOB: 05/16/1941 DOA: 05/22/2024 PCP: Omie Bickers, MD  Brief History:  83 year old female with a history of diabetes mellitus type 2, stroke, hypertension, COPD, hypothyroidism, hyperlipidemia, anxiety, PSVT presenting with coughing, chest congestion, shortness of breath.  The patient denies any fevers, chills, chest pain, nausea, vomiting or diarrhea.  She denies any hemoptysis.  She was recently admitted to the hospital from 05/10/2024 to 05/15/2024.  During that hospitalization, the patient was treated for COPD exacerbation and what was felt to be atypical pneumonia.  She was discharged with a prednisone  taper.  In addition, the patient was also admitted from 04/24/2024 to 05/01/2024 with septic shock secondary pneumonia. She has had increasing generalized weakness since discharge from the hospital.  There is no hematochezia or melena.  She does complain of some dysuria for the past week. In the ED, the patient was afebrile with soft blood pressures in the low 90s and upper 80s.  WBC 17.1, hemoglobin 12.5, platelet 236.  Sodium 130, potassium 4.2, bicarbonate 26, serum creatinine 0.82.  LFTs were unremarkable.  Chest x-ray showed chronic interstitial markings.  Lactic acid 2.8.  The patient was started on linezolid and cefepime .   Assessment/Plan: Severe sepsis - Presented with leukocytosis, lactic acid 2.8, low BPs - Secondary to pneumonia and UTI - 05/10/24 UA >50 WBC - 05/10/24 urine culture = VRE - Continue linezolid - continue cefepime  - Continue azithromycin  - remains on levophed  1 mg/kg/min - increase midodrine to 10 mg tid   VRE UTI - Continue linezolid   Acute respiratory failure with hypoxia - Secondary to COPD exacerbation, pneumonia - Presented with oxygen saturation 88% on room air - Stable on 2 L>>weaned to RA - Wean oxygen as tolerated - given lasix  IV x 1>>start lasix  40 mg po daily - COVID/RSV/Flu--neg - viral  respiratory panel--neg - CTA chest--neg PE;  scattered GGO bilateral UL.  Bibasilar patchy consolidation   COPD exacerbation - Continue Solu-Medrol  - Continue DuoNebs - Continue Brovana  - Continue Pulmicort  - added Yupelri  Hyperosmolar nonketotic state -due to steroids -serum glucose up to 687 -5/29 started insulin  drip>>transition to [facility] insulin  5/30 -start semglee  10 units and Novolog  q 4 hour   Hyperlipidemia - Continue statin   Controlled diabetes mellitus type 2 with hyperglycemia - 04/25/2024 hemoglobin A1c 7.0 - Holding metformin  - NovoLog  sliding scale   Chronic pain - Continue Lyrica    History of stroke - Continue aspirin , Plavix , statin   Depression/anxiety - Hold Trintellix  and venlafaxine  temporarily while getting linezolid   Hypothyroidism - Continue Synthroid    Aspiration pneumonitis -continue abx -speech therapy eval appreciated>>dys 3 with thine   Hypomagnesemia -repleted               Family Communication:   son at bedside 5/30   Consultants:  none   Code Status:  DNR   DVT Prophylaxis:  [place] Lovenox      Procedures: As Listed in Progress Note Above   Antibiotics: Zyvox 5/28>> Cefepime  5/28>> Azithro 5/28>>        Subjective: Patient denies fevers, chills, headache, chest pain,  nausea, vomiting, diarrhea, abdominal pain, dysuria, hematuria, hematochezia, and melena.  Remains sob with minimal exertion, but improving   Objective: Vitals:   05/24/24 0724 05/24/24 0726 05/24/24 0727 05/24/24 0810  BP:      Pulse:      Resp:      Temp:    97.7  F (36.5 C)  TempSrc:    Oral  SpO2: 95% 100% 100%   Weight:      Height:        Intake/Output Summary (Last 24 hours) at 05/24/2024 1123 Last data filed at 05/24/2024 0407 Gross per 24 hour  Intake 295.81 ml  Output 2375 ml  Net -2079.19 ml   Weight change:  Exam:  General:  Pt is alert, follows commands appropriately, not in acute distress HEENT: No icterus, No thrush,  No neck mass, [place]/AT Cardiovascular: RRR, S1/S2, no rubs, no gallops Respiratory: bibasilar rales.  Diminished BS.  Bibasilar wheeze Abdomen: Soft/+BS, non tender, non distended, no guarding Extremities: No edema, No lymphangitis, No petechiae, No rashes, no synovitis   Data Reviewed: I have personally reviewed following labs and imaging studies Basic Metabolic Panel: Recent Labs  Lab 05/23/24 0411 05/23/24 1649 05/23/24 2111 05/24/24 0121 05/24/24 0554 05/24/24 0835  NA 132* 126* 131* 133* 135 136  K 4.2 4.7 4.1 3.8 3.7 3.6  CL 96* 89* 93* 98 100 100  CO2 24 21* 22 25 25 27   GLUCOSE 469* 687* 413* 171* 216* 98  BUN 20 28* 30* 27* 25* 23  CREATININE 0.64 0.95 0.99 0.71 0.71 0.64  CALCIUM  7.9* 8.1* 8.4* 8.3* 8.2* 8.1*  MG 1.6*  --   --   --  1.9  --    Liver Function Tests: Recent Labs  Lab 05/22/24 1134  AST 16  ALT 33  ALKPHOS 51  BILITOT 1.0  PROT 6.2*  ALBUMIN 2.9*   No results for input(s): "LIPASE", "AMYLASE" in the last 168 hours. No results for input(s): "AMMONIA" in the last 168 hours. Coagulation Profile: No results for input(s): "INR", "PROTIME" in the last 168 hours. CBC: Recent Labs  Lab 05/22/24 1134 05/23/24 0411 05/24/24 0428  WBC 17.1* 16.9* 28.1*  NEUTROABS 13.5*  --   --   HGB 12.5 13.4 13.5  HCT 37.6 39.6 41.9  MCV 90.4 88.8 91.7  PLT 236 271 278   Cardiac Enzymes: No results for input(s): "CKTOTAL", "CKMB", "CKMBINDEX", "TROPONINI" in the last 168 hours. BNP: Invalid input(s): "POCBNP" CBG: Recent Labs  Lab 05/24/24 0603 05/24/24 0740 05/24/24 0854 05/24/24 0943 05/24/24 1057  GLUCAP 223* 125* 84 83 405*   HbA1C: No results for input(s): "HGBA1C" in the last 72 hours. Urine analysis:    Component Value Date/Time   COLORURINE YELLOW 05/22/2024 1428   APPEARANCEUR HAZY (A) 05/22/2024 1428   LABSPEC 1.011 05/22/2024 1428   PHURINE 7.0 05/22/2024 1428   GLUCOSEU >=500 (A) 05/22/2024 1428   GLUCOSEU >=1000 (A) 06/25/2020  1441   HGBUR NEGATIVE 05/22/2024 1428   BILIRUBINUR NEGATIVE 05/22/2024 1428   BILIRUBINUR neg 03/19/2013 1512   KETONESUR NEGATIVE 05/22/2024 1428   PROTEINUR NEGATIVE 05/22/2024 1428   UROBILINOGEN 0.2 06/25/2020 1441   NITRITE NEGATIVE 05/22/2024 1428   LEUKOCYTESUR LARGE (A) 05/22/2024 1428   Sepsis Labs: @LABRCNTIP (procalcitonin:4,lacticidven:4) ) Recent Results (from the past 240 hours)  Culture, blood (routine x 2)     Status: None (Preliminary result)   Collection Time: 05/22/24 11:31 AM   Specimen: BLOOD  Result Value Ref Range Status   Specimen Description BLOOD BLOOD LEFT ARM  Final   Special Requests   Final    BOTTLES DRAWN AEROBIC AND ANAEROBIC Blood Culture adequate volume   Culture   Final    NO GROWTH < 24 HOURS Performed at [facility], [address], [place], [place] [address]  Report Status PENDING  Incomplete  Culture, blood (routine x 2)     Status: None (Preliminary result)   Collection Time: 05/22/24 11:34 AM   Specimen: BLOOD  Result Value Ref Range Status   Specimen Description BLOOD BLOOD RIGHT HAND  Final   Special Requests   Final    BOTTLES DRAWN AEROBIC AND ANAEROBIC Blood Culture results may not be optimal due to an inadequate volume of blood received in culture bottles   Culture   Final    NO GROWTH < 24 HOURS Performed at [facility], [address], [place], [place] [address]    Report Status PENDING  Incomplete  Urine Culture     Status: Abnormal   Collection Time: 05/22/24  2:28 PM   Specimen: Urine, Random  Result Value Ref Range Status   Specimen Description   Final    URINE, RANDOM Performed at [facility], [address] [place], [place] [address]    Special Requests   Final    NONE Reflexed from [id number] Performed at [facility], [address] [place], [place] [address]    Culture (A)  Final    >=100,000 COLONIES/mL ENTEROCOCCUS FAECALIS VANCOMYCIN  RESISTANT ENTEROCOCCUS ISOLATED    Report Status 05/24/2024 FINAL   Final   Organism ID, Bacteria ENTEROCOCCUS FAECALIS (A)  Final      Susceptibility   Enterococcus faecalis - MIC*    AMPICILLIN <=2 SENSITIVE Sensitive     NITROFURANTOIN <=16 SENSITIVE Sensitive     VANCOMYCIN  >=32 RESISTANT Resistant     LINEZOLID  2 SENSITIVE Sensitive     * >=100,000 COLONIES/mL ENTEROCOCCUS FAECALIS  MRSA Next Gen by PCR, Nasal     Status: None   Collection Time: 05/22/24  3:37 PM   Specimen: Nasal Mucosa; Nasal Swab  Result Value Ref Range Status   MRSA by PCR Next Gen NOT DETECTED NOT DETECTED Final    Comment: (NOTE) The GeneXpert MRSA Assay (FDA approved for NASAL specimens only), is one component of a comprehensive MRSA colonization surveillance program. It is not intended to diagnose MRSA infection nor to guide or monitor treatment for MRSA infections. Test performance is not FDA approved in patients less than 20 years old. Performed at [facility], [address], [place], [place] [address]   Resp panel by RT-PCR (RSV, Flu A&B, Covid) Nasal Mucosa     Status: None   Collection Time: 05/22/24  3:37 PM   Specimen: Nasal Mucosa; Nasal Swab  Result Value Ref Range Status   SARS Coronavirus 2 by RT PCR NEGATIVE NEGATIVE Final    Comment: (NOTE) SARS-CoV-2 target nucleic acids are NOT DETECTED.  The SARS-CoV-2 RNA is generally detectable in upper respiratory specimens during the acute phase of infection. The lowest concentration of SARS-CoV-2 viral copies this assay can detect is 138 copies/mL. A negative result does not preclude SARS-Cov-2 infection and should not be used as the sole basis for treatment or other patient management decisions. A negative result may occur with  improper specimen collection/handling, submission of specimen other than nasopharyngeal swab, presence of viral mutation(s) within the areas targeted by this assay, and inadequate number of viral copies(<138 copies/mL). A negative result must be combined with clinical observations,  patient history, and epidemiological information. The expected result is Negative.  Fact Sheet for Patients:  BloggerCourse.com  Fact Sheet for Healthcare Providers:  SeriousBroker.it  This test is no t yet approved or cleared by the [place]  FDA and  has been authorized for detection  and/or diagnosis of SARS-CoV-2 by FDA under an Emergency Use Authorization (EUA). This EUA will remain  in effect (meaning this test can be used) for the duration of the COVID-19 declaration under Section 564(b)(1) of the Act, 21 U.S.C.section 360bbb-3(b)(1), unless the authorization is terminated  or revoked sooner.       Influenza A by PCR NEGATIVE NEGATIVE Final   Influenza B by PCR NEGATIVE NEGATIVE Final    Comment: (NOTE) The Xpert Xpress SARS-CoV-2/FLU/RSV plus assay is intended as an aid in the diagnosis of influenza from Nasopharyngeal swab specimens and should not be used as a sole basis for treatment. Nasal washings and aspirates are unacceptable for Xpert Xpress SARS-CoV-2/FLU/RSV testing.  Fact Sheet for Patients: BloggerCourse.com  Fact Sheet for Healthcare Providers: SeriousBroker.it  This test is not yet approved or cleared by the [place]  FDA and has been authorized for detection and/or diagnosis of SARS-CoV-2 by FDA under an Emergency Use Authorization (EUA). This EUA will remain in effect (meaning this test can be used) for the duration of the COVID-19 declaration under Section 564(b)(1) of the Act, 21 U.S.C. section 360bbb-3(b)(1), unless the authorization is terminated or revoked.     Resp Syncytial Virus by PCR NEGATIVE NEGATIVE Final    Comment: (NOTE) Fact Sheet for Patients: BloggerCourse.com  Fact Sheet for Healthcare Providers: SeriousBroker.it  This test is not yet approved or cleared by the [place] FDA and has been authorized for detection and/or diagnosis of SARS-CoV-2 by FDA under an Emergency Use Authorization (EUA). This EUA will remain in effect (meaning this test can be used) for the duration of the COVID-19 declaration under Section 564(b)(1) of the Act, 21 U.S.C. section 360bbb-3(b)(1), unless the authorization is terminated or revoked.  Performed at [facility], [address], [place], [place] [address]   Respiratory (~20 pathogens) panel by PCR     Status: None   Collection Time: 05/23/24 10:30 AM   Specimen: Nasopharyngeal Swab; Respiratory  Result Value Ref Range Status   Adenovirus NOT DETECTED NOT DETECTED Final   Coronavirus 229E NOT DETECTED NOT DETECTED Final    Comment: (NOTE) The Coronavirus on the Respiratory Panel, DOES NOT test for the novel  Coronavirus (2019 nCoV)    Coronavirus HKU1 NOT DETECTED NOT DETECTED Final   Coronavirus NL63 NOT DETECTED NOT DETECTED Final   Coronavirus OC43 NOT DETECTED NOT DETECTED Final   Metapneumovirus NOT DETECTED NOT DETECTED Final   Rhinovirus / Enterovirus NOT DETECTED NOT DETECTED Final   Influenza A NOT DETECTED NOT DETECTED Final   Influenza B NOT DETECTED NOT DETECTED Final   Parainfluenza Virus 1 NOT DETECTED NOT DETECTED Final   Parainfluenza Virus 2 NOT DETECTED NOT DETECTED Final   Parainfluenza Virus 3 NOT DETECTED NOT DETECTED Final   Parainfluenza Virus 4 NOT DETECTED NOT DETECTED Final   Respiratory Syncytial Virus NOT DETECTED NOT DETECTED Final   Bordetella pertussis NOT DETECTED NOT DETECTED Final   Bordetella Parapertussis NOT DETECTED NOT DETECTED Final   Chlamydophila pneumoniae NOT DETECTED NOT DETECTED Final   Mycoplasma pneumoniae NOT DETECTED NOT DETECTED Final    Comment: Performed at [facility], [address] [place], [place] [address]     Scheduled Meds:  albuterol   2.5 mg Nebulization TID   arformoterol   15 mcg Nebulization BID   aspirin  EC  81 mg Oral Daily    atorvastatin   40 mg Oral Daily   azithromycin   500 mg Oral Daily   budesonide  (PULMICORT ) nebulizer  solution  0.5 mg Nebulization BID   Chlorhexidine  Gluconate Cloth  6 each Topical Q0600   clopidogrel   75 mg Oral Daily   cycloSPORINE   1 drop Both Eyes BID   enoxaparin  (LOVENOX ) injection  40 mg Subcutaneous Q24H   feeding supplement  1 Container Oral TID BM   furosemide   40 mg Oral Daily   insulin  aspart  0-9 Units Subcutaneous Q4H   insulin  glargine-yfgn  10 Units Subcutaneous Daily   levothyroxine   50 mcg Oral Daily   methylPREDNISolone  (SOLU-MEDROL ) injection  60 mg Intravenous Daily   midodrine   5 mg Oral TID WC   mirabegron  ER  25 mg Oral Daily   pantoprazole   40 mg Oral Daily   pregabalin   150 mg Oral BID   revefenacin   175 mcg Nebulization Daily   senna  2 tablet Oral QHS   Continuous Infusions:  ceFEPIme  (MAXIPIME ) 2 g in sodium chloride  0.9 % 100 mL IVPB 2 g (05/24/24 0940)   linezolid  (ZYVOX ) IV 600 mg (05/24/24 1104)   norepinephrine  (LEVOPHED ) Adult infusion 3 mcg/min (05/24/24 0158)    Procedures/Studies: CT Angio Chest Pulmonary Embolism (PE) W or WO Contrast Result Date: 05/22/2024 CLINICAL DATA:  Pulmonary embolism suspected, low to intermediate probability, negative D-dimer. Shortness of breath and respiratory failure. EXAM: CT ANGIOGRAPHY CHEST WITH CONTRAST TECHNIQUE: Multidetector CT imaging of the chest was performed using the standard protocol during bolus administration of intravenous contrast. Multiplanar CT image reconstructions and MIPs were obtained to evaluate the vascular anatomy. RADIATION DOSE REDUCTION: This exam was performed according to the departmental dose-optimization program which includes automated exposure control, adjustment of the mA and/or kV according to patient size and/or use of iterative reconstruction technique. CONTRAST:  75mL OMNIPAQUE  IOHEXOL  350 MG/ML SOLN COMPARISON:  04/24/2024. FINDINGS: Cardiovascular: The heart is borderline  enlarged and there is no pericardial effusion. There are calcifications in the mitral valve annulus. There is mild atherosclerotic calcification of the aorta without evidence of aneurysm. The pulmonary trunk is normal in caliber. There is no evidence of pulmonary embolism. Mediastinum/Nodes: No mediastinal, hilar, or axillary lymphadenopathy. The trachea and esophagus are within normal limits. Lungs/Pleura: There are small bilateral pleural effusions, greater on the left than on the right. Patchy atelectasis and consolidation is noted in the lower lobes bilaterally. A few scattered ground-glass opacities are noted in the upper lobes bilaterally. No pneumothorax is seen. There is a 3 mm nodule in the posterior segment of the left upper lobe, axial image 64. Upper Abdomen: The gallbladder is surgically absent. There is stable nodularity of the left adrenal gland, previously characterized as adenoma is. No acute abnormality is seen. Musculoskeletal: Degenerative changes are present in the thoracic spine. No acute osseous abnormality is seen. Review of the MIP images confirms the above findings. IMPRESSION: 1. No evidence of pulmonary embolism. 2. Scattered ground-glass opacities in the upper lobes bilaterally and patchy consolidation at the lung bases, suspicious for multifocal pneumonia, slightly increased at the left lung base. 3. Small bilateral pleural effusions. 4. 3 mm left upper lobe pulmonary nodule. No follow-up needed if patient is low-risk.This recommendation follows the consensus statement: Guidelines for Management of Incidental Pulmonary Nodules Detected on CT Images: From the Fleischner Society 2017; Radiology 2017; 284:228-243. 5. Aortic atherosclerosis. Electronically Signed   By: Wyvonnia Heimlich M.D.   On: 05/22/2024 20:29   DG Chest Portable 1 View Result Date: 05/22/2024 CLINICAL DATA:  cough EXAM: PORTABLE CHEST - 1 VIEW COMPARISON:  May 10, 2024 FINDINGS: Low  lung volumes. No focal airspace  consolidation, pleural effusion, or pneumothorax. No cardiomegaly. Tortuous aorta with aortic atherosclerosis. No acute fracture or destructive lesion. Multilevel thoracic osteophytosis. Osteopenia. IMPRESSION: No acute cardiopulmonary abnormality. Electronically Signed   By: Rance Burrows M.D.   On: 05/22/2024 13:50   DG Chest Port 1 View Result Date: 05/10/2024 CLINICAL DATA:  Questionable sepsis EXAM: PORTABLE CHEST 1 VIEW COMPARISON:  Chest x-ray 04/26/2024.  Chest CT 04/24/2004. FINDINGS: The heart size and mediastinal contours are within normal limits. Both lungs are clear. The visualized skeletal structures are unremarkable. IMPRESSION: No active disease. Electronically Signed   By: Tyron Gallon M.D.   On: 05/10/2024 15:17   DG Swallowing Func-Speech Pathology Result Date: 04/29/2024 Table formatting from the original result was not included. Modified Barium Swallow Study Patient Details Name: MERRISSA GIACOBBE MRN: [id number] Date of Birth: 1941-11-05 Today's Date: 04/29/2024 HPI/PMH: HPI: RENNA KILMER is a 83 y.o. female with medical history significant for diabetes, hypertension, stroke, pituitary adenoma, PSVT, anxiety and depression.  Patient was brought to the ED from her group facility with reports of confusion that started today, also with cough, fever.   At the time of my evaluation, patient is lethargic, responds to direction but unable to answer questions or give history. Recent hospitalization 3/17 to 3/25 for acute/early subacute stroke with concomitant acute metabolic encephalopathy.  Was discharged to SNF.     Patient has been admitted with septic shock secondary to multifocal pneumonia along with associated acute hypoxemic respiratory failure and acute metabolic encephalopathy. BSE requested. Clinical Impression: Patient presents with oropharyngeal swallowing to be essentially [name]. Note one episode of flash frank penetration of thin liquids that was cleared during the swallow and occasional  premature spillage of thin liquids penetrated before the swallow but was cleared during the swallow every time. Swallow is often triggered at the posterior angle of the ramus but sometimes is delayed until bolus fills the pyriform sinuses. Pharyngeal stripping wave is slightly diminished resulting in occasional trace pharyngeal residue. Note suspected small Zenker's Diverticulum (ZD) - no radiologist present to confirm. Residue in ZD with retrograde movement back up to the pyriforms. Repeat dry swallow clears retrograde residue from the pyriforms, however note it again collects in the ZD. No retrograde residue was penetrated or aspirated. Esophageal sweep was unremarkable. Recommend upgrade Pt's diet to D3/mech soft and continue with thin liquids. Recommend Pt utilize repeat swallow with every sip/bite to ensure trace/min retrograde residue is cleared from pharynx. Reviewed findings with patient and RN. There are no further ST needs noted at this time, our service will sign off. Factors that may increase risk of adverse event in presence of aspiration Roderick Civatte & Jessy Morocco 2021): No data recorded Recommendations/Plan: Swallowing Evaluation Recommendations Swallowing Evaluation Recommendations Recommendations: PO diet PO Diet Recommendation: Dysphagia 3 (Mechanical soft); Thin liquids (Level 0) Liquid Administration via: Cup; Straw Medication Administration: Whole meds with liquid Supervision: Patient able to self-feed Swallowing strategies  : Slow rate; Small bites/sips; Multiple dry swallows after each bite/sip Postural changes: Position pt fully upright for meals Oral care recommendations: Oral care BID (2x/day) Treatment Plan Treatment Plan Treatment recommendations: Therapy as outlined in treatment plan below Follow-up recommendations: No SLP follow up Interventions: Aspiration precaution training Recommendations Recommendations for follow up therapy are one component of a multi-disciplinary discharge planning  process, led by the attending physician.  Recommendations may be updated based on patient status, additional functional criteria and insurance authorization. Assessment: Orofacial Exam: Orofacial Exam Oral Cavity: Oral  Hygiene: [facility] Oral Cavity - Dentition: Adequate natural dentition; Missing dentition Orofacial Anatomy: WFL Oral Motor/Sensory Function: WFL Anatomy: Anatomy: WFL Boluses Administered: Boluses Administered Boluses Administered: Thin liquids (Level 0); Mildly thick liquids (Level 2, nectar thick); Moderately thick liquids (Level 3, honey thick); Puree; Solid  Oral Impairment Domain: Oral Impairment Domain Lip Closure: No labial escape Tongue control during bolus hold: Not tested Bolus preparation/mastication: Timely and efficient chewing and mashing Bolus transport/lingual motion: Brisk tongue motion Oral residue: Complete oral clearance Initiation of pharyngeal swallow : Posterior angle of the ramus; Pyriform sinuses  Pharyngeal Impairment Domain: Pharyngeal Impairment Domain Soft palate elevation: No bolus between soft palate (SP)/pharyngeal wall (PW) Laryngeal elevation: Complete superior movement of thyroid  cartilage with complete approximation of arytenoids to epiglottic petiole Anterior hyoid excursion: Complete anterior movement Epiglottic movement: Complete inversion Laryngeal vestibule closure: Complete, no air/contrast in laryngeal vestibule; Incomplete, narrow column air/contrast in laryngeal vestibule Pharyngeal stripping wave : Present - complete Pharyngeal contraction (A/P view only): N/A Pharyngoesophageal segment opening: Complete distension and complete duration, no obstruction of flow Tongue base retraction: No contrast between tongue base and posterior pharyngeal wall (PPW) Pharyngeal residue: Trace residue within or on pharyngeal structures Location of pharyngeal residue: Tongue base; Valleculae  Esophageal Impairment Domain: Esophageal Impairment Domain Esophageal clearance upright  position: Esophageal retention with retrograde flow below pharyngoesophageal segment (PES) (small Zenker's Diverticulum) Pill: Pill Consistency administered: Thin liquids (Level 0) Thin liquids (Level 0): [facility] Penetration/Aspiration Scale Score: Penetration/Aspiration Scale Score 1.  Material does not enter airway: Thin liquids (Level 0); Mildly thick liquids (Level 2, nectar thick); Moderately thick liquids (Level 3, honey thick); Solid; Puree; Pill 2.  Material enters airway, remains ABOVE vocal cords then ejected out: Thin liquids (Level 0) Compensatory Strategies: Compensatory Strategies Compensatory strategies: Yes Multiple swallows: Effective   General Information: Caregiver present: No  Diet Prior to this Study: Dysphagia 2 (finely chopped); Thin liquids (Level 0)   Temperature : Normal   Respiratory Status: WFL   Supplemental O2: Nasal cannula   History of Recent Intubation: No  Behavior/Cognition: Alert; Cooperative; Pleasant mood; Confused Self-Feeding Abilities: Able to self-feed Baseline vocal quality/speech: Normal Volitional Cough: Able to elicit Volitional Swallow: Able to elicit No data recorded Goal Planning: No data recorded No data recorded No data recorded No data recorded No data recorded Pain: Pain Assessment Pain Assessment: No/denies pain End of Session: Start Time:SLP Start Time (ACUTE ONLY): 1121 Stop Time: SLP Stop Time (ACUTE ONLY): 1154 Time Calculation:SLP Time Calculation (min) (ACUTE ONLY): 33 min Charges: SLP Evaluations $ SLP Speech Visit: 1 Visit SLP Evaluations $MBS Swallow: 1 Procedure SLP visit diagnosis: SLP Visit Diagnosis: Dysphagia, unspecified (R13.10) Past Medical History: Past Medical History: Diagnosis Date  Anxiety   Asthma   ASYMPTOMATIC POSTMENOPAUSAL STATUS 02/09/2009  Cataract   Constipation 03/19/2013  Depression   DIABETES MELLITUS, TYPE II 09/14/2007  HYPERCHOLESTEROLEMIA 02/09/2009  HYPERTENSION 02/09/2009  HYPOTHYROIDISM 09/14/2007  MIGRAINE HEADACHE 09/14/2007   Neuropathy   OSTEOPOROSIS 02/09/2009  PANCREATITIS 09/14/2007  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  PONV (postoperative nausea and vomiting)   Rectocele 03/19/2013  Shortness of breath   SUPERFICIAL PHLEBITIS 09/14/2007  Varicose veins  Past Surgical History: Past Surgical History: Procedure Laterality Date  ABDOMINAL HYSTERECTOMY    ANTERIOR AND POSTERIOR REPAIR N/A 04/02/2013  Procedure: ANTERIOR (CYSTOCELE) AND POSTERIOR REPAIR (RECTOCELE);  Surgeon: Albino Hum, MD;  Location: [facility];  Service: Gynecology;  Laterality: N/A;  APPENDECTOMY    BRAIN SURGERY    CATARACT EXTRACTION W/PHACO  12/05/2011  Procedure: CATARACT EXTRACTION PHACO AND INTRAOCULAR LENS PLACEMENT (IOC);  Surgeon: Anner Kill;  Location: [facility];  Service: Ophthalmology;  Laterality: Left;  CDE:9.65  CHOLECYSTECTOMY    CRANIOTOMY N/A 06/04/2020  Procedure: Endoscopic Transphenoidal Resection of Recurrent PituitaryTumor;  Surgeon: Elna Haggis, MD;  Location: [facility];  Service: Neurosurgery;  Laterality: N/A;  CYSTOSCOPY    ENDOVENOUS ABLATION SAPHENOUS VEIN W/ LASER  11-03-2011   right greater saphenous vein  left leg done 10-2011  EYE SURGERY  98  right cataract extraction 98  LAPAROSCOPIC NISSEN FUNDOPLICATION    NM ESOPHAGEAL REFLUX  08-11-11  PITUITARY EXCISION  10/1997  POSTERIOR REPAIR    TRANSNASAL APPROACH N/A 06/04/2020  Procedure: TRANSNASAL APPROACH;  Surgeon: Ammon Bales, MD;  Location: [facility] OR;  Service: ENT;  Laterality: N/A;  TRANSPHENOIDAL / TRANSNASAL HYPOPHYSECTOMY / RESECTION PITUITARY TUMOR  08-11-11 Florina Husbands 04/29/2024, 12:42 PM  ECHOCARDIOGRAM COMPLETE Result Date: 04/27/2024    ECHOCARDIOGRAM REPORT   Patient Name:   IRIS HAIRSTON Date of Exam: 04/27/2024 Medical Rec #:  [id number]      Height:       65.0 in Accession #:    [id number]     Weight:       125.4 lb Date of Birth:  12/28/1940       BSA:          1.622 m Patient Age:    82 years       BP:           91/54 mmHg Patient Gender: F              HR:           99 bpm. Exam  Location:  [facility] Procedure: 2D Echo, Color Doppler and Cardiac Doppler (Both Spectral and Color            Flow Doppler were utilized during procedure). Indications:    I50.31 Acute diastolic (congestive) heart failure  History:        Patient has prior history of Echocardiogram examinations, most                 recent 03/13/2024. Risk Factors:Hypertension, Diabetes and                 Dyslipidemia.  Sonographer:    Sherline Distel Senior RDCS Referring Phys: [id number] PRATIK D [facility] IMPRESSIONS  1. Left ventricular ejection fraction, by estimation, is 60 to 65%. The left ventricle has normal function. The left ventricle has no regional wall motion abnormalities. Left ventricular diastolic parameters are indeterminate.  2. Right ventricular systolic function is normal. The right ventricular size is normal.  3. Left atrial size was mildly dilated.  4. MVA by PT1/2 1.23 cm2 with men gradient 12 peak 19 mmHg at HR of 101 bpm. The mitral valve is abnormal. Mild mitral valve regurgitation. Moderate mitral stenosis. Moderate mitral annular calcification.  5. The aortic valve is tricuspid. There is mild calcification of the aortic valve. There is mild thickening of the aortic valve. Aortic valve regurgitation is trivial. Aortic valve sclerosis is present, with no evidence of aortic valve stenosis.  6. The inferior vena cava is normal in size with greater than 50% respiratory variability, suggesting right atrial pressure of 3 mmHg. FINDINGS  Left Ventricle: Left ventricular ejection fraction, by estimation, is 60 to 65%. The left ventricle has normal function. The left ventricle has no regional wall motion abnormalities. Strain was performed and the global longitudinal strain is  indeterminate. The left ventricular internal cavity size was normal in size. There is no left ventricular hypertrophy. Left ventricular diastolic parameters are indeterminate. Right Ventricle: The right ventricular size is normal. No increase in right  ventricular wall thickness. Right ventricular systolic function is normal. Left Atrium: Left atrial size was mildly dilated. Right Atrium: Right atrial size was normal in size. Pericardium: There is no evidence of pericardial effusion. Mitral Valve: MVA by PT1/2 1.23 cm2 with men gradient 12 peak 19 mmHg at HR of 101 bpm. The mitral valve is abnormal. There is moderate thickening of the mitral valve leaflet(s). There is moderate calcification of the mitral valve leaflet(s). Moderate mitral annular calcification. Mild mitral valve regurgitation. Moderate mitral valve stenosis. MV peak gradient, 19.1 mmHg. The mean mitral valve gradient is 12.7 mmHg. Tricuspid Valve: The tricuspid valve is normal in structure. Tricuspid valve regurgitation is trivial. No evidence of tricuspid stenosis. Aortic Valve: The aortic valve is tricuspid. There is mild calcification of the aortic valve. There is mild thickening of the aortic valve. Aortic valve regurgitation is trivial. Aortic valve sclerosis is present, with no evidence of aortic valve stenosis. Pulmonic Valve: The pulmonic valve was normal in structure. Pulmonic valve regurgitation is not visualized. No evidence of pulmonic stenosis. Aorta: The aortic root is normal in size and structure. Venous: The inferior vena cava is normal in size with greater than 50% respiratory variability, suggesting right atrial pressure of 3 mmHg. IAS/Shunts: No atrial level shunt detected by color flow Doppler. Additional Comments: 3D was performed not requiring image post processing on an independent workstation and was indeterminate.  LEFT VENTRICLE PLAX 2D LVIDd:         3.70 cm LVIDs:         2.90 cm LV PW:         0.70 cm LV IVS:        0.80 cm LVOT diam:     1.80 cm LV SV:         55 LV SV Index:   34 LVOT Area:     2.54 cm  RIGHT VENTRICLE RV S prime:     13.80 cm/s TAPSE (M-mode): 1.8 cm LEFT ATRIUM             Index        RIGHT ATRIUM           Index LA diam:        3.80 cm 2.34 cm/m    RA Area:     13.30 cm LA Vol (A2C):   60.3 ml 37.17 ml/m  RA Volume:   28.90 ml  17.81 ml/m LA Vol (A4C):   45.0 ml 27.74 ml/m LA Biplane Vol: 55.6 ml 34.27 ml/m  AORTIC VALVE LVOT Vmax:   115.00 cm/s LVOT Vmean:  76.200 cm/s LVOT VTI:    0.216 m  AORTA Ao Root diam: 3.00 cm Ao Asc diam:  3.20 cm MITRAL VALVE MV Area VTI:  1.15 cm    SHUNTS MV Peak grad: 19.1 mmHg   Systemic VTI:  0.22 m MV Mean grad: 12.7 mmHg   Systemic Diam: 1.80 cm MV Vmax:      2.18 m/s MV Vmean:     172.3 cm/s Janelle Mediate MD Electronically signed by Janelle Mediate MD Signature Date/Time: 04/27/2024/2:17:05 PM    Final    DG CHEST PORT 1 VIEW Result Date: 04/26/2024 CLINICAL DATA:  Hypoxemia. EXAM: PORTABLE CHEST 1 VIEW COMPARISON:  Chest radiograph dated 04/24/2024. FINDINGS: Diffuse interstitial  and interlobular septal prominence and edema. Small bilateral pleural effusions. No pneumothorax. The cardiac silhouette is within limits. Atherosclerotic calcification of the aorta. No acute osseous pathology. IMPRESSION: Pulmonary edema with small bilateral pleural effusions. Electronically Signed   By: Angus Bark M.D.   On: 04/26/2024 13:25   CT Head Wo Contrast Result Date: 04/24/2024 CLINICAL DATA:  ams EXAM: CT HEAD WITHOUT CONTRAST TECHNIQUE: Contiguous axial images were obtained from the base of the skull through the vertex without intravenous contrast. RADIATION DOSE REDUCTION: This exam was performed according to the departmental dose-optimization program which includes automated exposure control, adjustment of the mA and/or kV according to patient size and/or use of iterative reconstruction technique. COMPARISON:  CT scan head from 03/14/2024. FINDINGS: Brain: No evidence of acute infarction, hemorrhage, hydrocephalus, extra-axial collection or mass lesion/mass effect. There is bilateral periventricular hypodensity, which is non-specific but most likely seen in the settings of microvascular ischemic changes. Moderate in  extent. 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 Otherwise normal appearance of brain parenchyma. Redemonstration of patient's known sellar/suprasellar mass. No significant interval change. Ventricles are normal. Cerebral volume is age appropriate. Vascular: No hyperdense vessel or unexpected calcification. Intracranial arteriosclerosis. Skull: Normal. Negative for fracture or focal lesion. Sinuses/Orbits: No acute finding. Other: Visualized mastoid air cells are unremarkable. No mastoid effusion. IMPRESSION: *No acute intracranial abnormality. No significant interval change. Electronically Signed   By: Beula Brunswick M.D.   On: 04/24/2024 14:00   CT Chest Wo Contrast Result Date: 04/24/2024 CLINICAL DATA:  cf pna.  Increased altered mental status. EXAM: CT CHEST WITHOUT CONTRAST TECHNIQUE: Multidetector CT imaging of the chest was performed following the standard protocol without IV contrast. RADIATION DOSE REDUCTION: This exam was performed according to the departmental dose-optimization program which includes automated exposure control, adjustment of the mA and/or kV according to patient size and/or use of iterative reconstruction technique. COMPARISON:  CT scan chest from 12/25/2023. FINDINGS: Cardiovascular: Normal cardiac size. No pericardial effusion. No aortic aneurysm. There are coronary artery calcifications, in keeping with coronary artery disease. There are also mild peripheral atherosclerotic vascular calcifications of thoracic aorta and its major branches. Mediastinum/Nodes: Visualized thyroid  gland appears grossly unremarkable. No solid / cystic mediastinal masses. The esophagus is nondistended precluding optimal assessment. There are few enlarged mediastinal lymph nodes, which appear grossly similar to the prior study and favored benign/reactive in the given clinical setting. No axillary lymphadenopathy by size criteria. Evaluation of bilateral hila is limited due to lack on  intravenous contrast: however, no large hilar lymphadenopathy identified. Lungs/Pleura: The central tracheo-bronchial tree is patent. There are heterogeneous solid and ground-glass opacities throughout left lung and patchy opacities in the right lung lower lobe, compatible with multilobar pneumonia. Follow-up to clearing is recommended. There is no associated lung mass, pleural effusion or pneumothorax. No suspicious lung nodule. Upper Abdomen: Surgically absent gallbladder. Stable left adrenal adenomas. Remaining visualized upper abdominal viscera within normal limits. Musculoskeletal: The visualized soft tissues of the chest wall are grossly unremarkable. No suspicious osseous lesions. There are mild to moderate multilevel degenerative changes in the visualized spine. IMPRESSION: 1. There are heterogeneous, solid and ground-glass opacities throughout left lung and patchy opacities in the right lung lower lobe, compatible with multilobar pneumonia. Follow-up to clearing is recommended. 2. Multiple other nonacute observations, as described above. Aortic Atherosclerosis (ICD10-I70.0). Electronically Signed   By: Beula Brunswick M.D.   On: 04/24/2024 13:56   DG Chest Portable 1 View Result Date: 04/24/2024 CLINICAL DATA:  Fever and cough. EXAM:  PORTABLE CHEST 1 VIEW COMPARISON:  Chest radiograph dated 03/11/2024. FINDINGS: Diffuse interstitial nodular density throughout the left lung concerning for pneumonia, possibly atypical in etiology. Aspiration is not excluded. No consolidative changes. There is no pleural effusion pneumothorax. The cardiac silhouette is within limits. Atherosclerotic calcification of the aorta. No acute osseous pathology. IMPRESSION: Diffuse interstitial nodular density throughout the left lung concerning for pneumonia. Electronically Signed   By: Angus Bark M.D.   On: 04/24/2024 11:40    Demaris Fillers, DO  [facility]  If 7PM-7AM, please contact  night-coverage [email or web address] Password [email or web address] 05/24/2024, 11:23 AM   LOS: 2 days

## 2024-05-25 DIAGNOSIS — J9601 Acute respiratory failure with hypoxia: Secondary | ICD-10-CM | POA: Diagnosis not present

## 2024-05-25 DIAGNOSIS — J690 Pneumonitis due to inhalation of food and vomit: Secondary | ICD-10-CM | POA: Diagnosis not present

## 2024-05-25 DIAGNOSIS — A419 Sepsis, unspecified organism: Secondary | ICD-10-CM | POA: Diagnosis not present

## 2024-05-25 DIAGNOSIS — A4181 Sepsis due to Enterococcus: Secondary | ICD-10-CM | POA: Diagnosis not present

## 2024-05-25 LAB — BASIC METABOLIC PANEL WITH GFR
Anion gap: 10 (ref 5–15)
BUN: 21 mg/dL (ref 8–23)
CO2: 26 mmol/L (ref 22–32)
Calcium: 8 mg/dL — ABNORMAL LOW (ref 8.9–10.3)
Chloride: 103 mmol/L (ref 98–111)
Creatinine, Ser: 0.61 mg/dL (ref 0.44–1.00)
GFR, Estimated: >60 mL/min (ref >60)
Glucose, Bld: 208 mg/dL — ABNORMAL HIGH (ref 70–99)
Potassium: 3.9 mmol/L (ref 3.5–5.1)
Sodium: 139 mmol/L (ref 135–145)

## 2024-05-25 LAB — GLUCOSE, CAPILLARY
Glucose-Capillary: 157 mg/dL — ABNORMAL HIGH (ref 70–99)
Glucose-Capillary: 250 mg/dL — ABNORMAL HIGH (ref 70–99)
Glucose-Capillary: 328 mg/dL — ABNORMAL HIGH (ref 70–99)
Glucose-Capillary: 416 mg/dL — ABNORMAL HIGH (ref 70–99)
Glucose-Capillary: 483 mg/dL — ABNORMAL HIGH (ref 70–99)
Glucose-Capillary: 508 mg/dL (ref 70–99)
Glucose-Capillary: 75 mg/dL (ref 70–99)

## 2024-05-25 LAB — MAGNESIUM: Magnesium: 1.8 mg/dL (ref 1.7–2.4)

## 2024-05-25 LAB — PHOSPHORUS: Phosphorus: 2.4 mg/dL — ABNORMAL LOW (ref 2.5–4.6)

## 2024-05-25 MED ORDER — INSULIN ASPART 100 UNIT/ML IJ SOLN
7.0000 [IU] | Freq: Once | INTRAMUSCULAR | Status: AC
  Administered 2024-05-25: 7 [IU] via SUBCUTANEOUS

## 2024-05-25 MED ORDER — K PHOS MONO-SOD PHOS DI & MONO 155-852-130 MG PO TABS
500.0000 mg | ORAL_TABLET | Freq: Two times a day (BID) | ORAL | Status: DC
  Administered 2024-05-25 – 2024-05-28 (×7): 500 mg via ORAL
  Filled 2024-05-25 (×7): qty 2

## 2024-05-25 MED ORDER — HYDROCORTISONE ACETATE 25 MG RE SUPP
25.0000 mg | Freq: Two times a day (BID) | RECTAL | Status: DC
  Administered 2024-05-25 – 2024-05-28 (×6): 25 mg via RECTAL
  Filled 2024-05-25 (×6): qty 1

## 2024-05-25 MED ORDER — PREDNISONE 20 MG PO TABS
50.0000 mg | ORAL_TABLET | Freq: Every day | ORAL | Status: DC
  Administered 2024-05-26: 50 mg via ORAL
  Filled 2024-05-25: qty 1

## 2024-05-25 MED ORDER — INSULIN GLARGINE-YFGN 100 UNIT/ML ~~LOC~~ SOLN
10.0000 [IU] | Freq: Every day | SUBCUTANEOUS | Status: DC
  Administered 2024-05-26 – 2024-05-28 (×3): 10 [IU] via SUBCUTANEOUS
  Filled 2024-05-25 (×4): qty 0.1

## 2024-05-25 MED ORDER — INSULIN ASPART 100 UNIT/ML IJ SOLN
6.0000 [IU] | Freq: Three times a day (TID) | INTRAMUSCULAR | Status: DC
  Administered 2024-05-26 – 2024-05-27 (×6): 6 [IU] via SUBCUTANEOUS

## 2024-05-25 MED ORDER — INSULIN ASPART 100 UNIT/ML IJ SOLN
25.0000 [IU] | Freq: Once | INTRAMUSCULAR | Status: AC
  Administered 2024-05-25: 25 [IU] via SUBCUTANEOUS

## 2024-05-25 MED ORDER — POLYETHYLENE GLYCOL 3350 17 G PO PACK
17.0000 g | PACK | Freq: Two times a day (BID) | ORAL | Status: DC
  Administered 2024-05-25 – 2024-05-28 (×5): 17 g via ORAL
  Filled 2024-05-25 (×6): qty 1

## 2024-05-25 MED ORDER — INSULIN ASPART 100 UNIT/ML IJ SOLN
4.0000 [IU] | Freq: Three times a day (TID) | INTRAMUSCULAR | Status: DC
  Administered 2024-05-25: 4 [IU] via SUBCUTANEOUS

## 2024-05-25 NOTE — Progress Notes (Addendum)
 PROGRESS NOTE  [name] White ZOX:[id number] DOB: 03-Jan-1941 DOA: 05/22/2024 PCP: Omie Bickers, MD  Brief History:  83 year old female with a history of diabetes mellitus type 2, stroke, hypertension, COPD, hypothyroidism, hyperlipidemia, anxiety, PSVT presenting with coughing, chest congestion, shortness of breath.  The patient denies any fevers, chills, chest pain, nausea, vomiting or diarrhea.  She denies any hemoptysis.  She was recently admitted to the hospital from 05/10/2024 to 05/15/2024.  During that hospitalization, the patient was treated for COPD exacerbation and what was felt to be atypical pneumonia.  She was discharged with a prednisone  taper.  In addition, the patient was also admitted from 04/24/2024 to 05/01/2024 with septic shock secondary pneumonia. She has had increasing generalized weakness since discharge from the hospital.  There is no hematochezia or melena.  She does complain of some dysuria for the past week. In the ED, the patient was afebrile with soft blood pressures in the low 90s and upper 80s.  WBC 17.1, hemoglobin 12.5, platelet 236.  Sodium 130, potassium 4.2, bicarbonate 26, serum creatinine 0.82.  LFTs were unremarkable.  Chest x-ray showed chronic interstitial markings.  Lactic acid 2.8.  The patient was started on linezolid  and cefepime .   Assessment/Plan: Severe sepsis - Presented with leukocytosis, lactic acid 2.8, low BPs - Secondary to pneumonia and UTI - 05/10/24 UA >50 WBC - 05/10/24 urine culture = VRE - Continue linezolid  - continue cefepime  - Continue azithromycin  - remains on levophed  1 mg/kg/min - increased midodrine  to 10 mg tid   VRE UTI - Continue linezolid    Acute respiratory failure with hypoxia - Secondary to COPD exacerbation, pneumonia - Presented with oxygen saturation 88% on room air - Stable on 2 L>>weaned to RA - Wean oxygen as tolerated - given lasix  IV x 1>>start lasix  40 mg po daily - COVID/RSV/Flu--neg - viral  respiratory panel--neg - CTA chest--neg PE;  scattered GGO bilateral UL.  Bibasilar patchy consolidation   COPD exacerbation - Continue Solu-Medrol >>wean to prednisone  6/1 - Continue DuoNebs - Continue Brovana  - Continue Pulmicort  - continue Yupelri    Hyperosmolar nonketotic state -due to steroids -serum glucose up to 687 -5/29 started insulin  drip>>transition to [facility] insulin  5/30 -start semglee  10 units and Novolog  q 4 hour -add novolog  4 units with meals -advised family not to bring outside food   Hyperlipidemia - Continue statin   Controlled diabetes mellitus type 2 with hyperglycemia - 04/25/2024 hemoglobin A1c 7.0 - Holding metformin  - NovoLog  sliding scale   Chronic pain - Continue Lyrica    History of stroke - Continue aspirin , Plavix , statin   Depression/anxiety - Hold Trintellix  and venlafaxine  temporarily while getting linezolid    Hypothyroidism - Continue Synthroid    Aspiration pneumonitis -continue abx -speech therapy eval appreciated>>dys 3 with thine   Hypomagnesemia/hypophosphatemia -repleted -add kphos               Family Communication:   son at bedside 5/30   Consultants:  none   Code Status:  DNR   DVT Prophylaxis:  [place] Lovenox      Procedures: As Listed in Progress Note Above   Antibiotics: Zyvox  5/28>> Cefepime  5/28>> Azithro 5/28>>       Subjective: Patient denies fevers, chills, headache, chest pain, dyspnea, nausea, vomiting, diarrhea, abdominal pain, dysuria, hematuria, hematochezia, and melena.   Objective: Vitals:   05/25/24 1000 05/25/24 1100 05/25/24 1200 05/25/24 1202  BP: (!) 94/45 (!) 81/33 (!) 109/50 (!) 109/50  Pulse: 87 91  87 86  Resp: 14 18 (!) 21 16  Temp:    97.7 F (36.5 C)  TempSrc:    Oral  SpO2: 97% 95% 98% 97%  Weight:      Height:        Intake/Output Summary (Last 24 hours) at 05/25/2024 1251 Last data filed at 05/25/2024 1012 Gross per 24 hour  Intake 345.48 ml  Output 3000 ml  Net  -2654.52 ml   Weight change:  Exam:  General:  Pt is alert, follows commands appropriately, not in acute distress HEENT: No icterus, No thrush, No neck mass, [place]/AT Cardiovascular: RRR, S1/S2, no rubs, no gallops Respiratory: bibasilar rales.  No wheeze Abdomen: Soft/+BS, non tender, non distended, no guarding Extremities: No edema, No lymphangitis, No petechiae, No rashes, no synovitis   Data Reviewed: I have personally reviewed following labs and imaging studies Basic Metabolic Panel: Recent Labs  Lab 05/23/24 0411 05/23/24 1649 05/23/24 2111 05/24/24 0121 05/24/24 0554 05/24/24 0835 05/25/24 0300  NA 132*   < > 131* 133* 135 136 139  K 4.2   < > 4.1 3.8 3.7 3.6 3.9  CL 96*   < > 93* 98 100 100 103  CO2 24   < > 22 25 25 27 26   GLUCOSE 469*   < > 413* 171* 216* 98 208*  BUN 20   < > 30* 27* 25* 23 21  CREATININE 0.64   < > 0.99 0.71 0.71 0.64 0.61  CALCIUM  7.9*   < > 8.4* 8.3* 8.2* 8.1* 8.0*  MG 1.6*  --   --   --  1.9  --  1.8  PHOS  --   --   --   --   --   --  2.4*   < > = values in this interval not displayed.   Liver Function Tests: Recent Labs  Lab 05/22/24 1134  AST 16  ALT 33  ALKPHOS 51  BILITOT 1.0  PROT 6.2*  ALBUMIN 2.9*   No results for input(s): "LIPASE", "AMYLASE" in the last 168 hours. No results for input(s): "AMMONIA" in the last 168 hours. Coagulation Profile: No results for input(s): "INR", "PROTIME" in the last 168 hours. CBC: Recent Labs  Lab 05/22/24 1134 05/23/24 0411 05/24/24 0428  WBC 17.1* 16.9* 28.1*  NEUTROABS 13.5*  --   --   HGB 12.5 13.4 13.5  HCT 37.6 39.6 41.9  MCV 90.4 88.8 91.7  PLT 236 271 278   Cardiac Enzymes: No results for input(s): "CKTOTAL", "CKMB", "CKMBINDEX", "TROPONINI" in the last 168 hours. BNP: Invalid input(s): "POCBNP" CBG: Recent Labs  Lab 05/24/24 1958 05/24/24 2340 05/25/24 0404 05/25/24 0800 05/25/24 1121  GLUCAP 408* 373* 157* 75 508*   HbA1C: No results for input(s): "HGBA1C" in  the last 72 hours. Urine analysis:    Component Value Date/Time   COLORURINE YELLOW 05/22/2024 1428   APPEARANCEUR HAZY (A) 05/22/2024 1428   LABSPEC 1.011 05/22/2024 1428   PHURINE 7.0 05/22/2024 1428   GLUCOSEU >=500 (A) 05/22/2024 1428   GLUCOSEU >=1000 (A) 06/25/2020 1441   HGBUR NEGATIVE 05/22/2024 1428   BILIRUBINUR NEGATIVE 05/22/2024 1428   BILIRUBINUR neg 03/19/2013 1512   KETONESUR NEGATIVE 05/22/2024 1428   PROTEINUR NEGATIVE 05/22/2024 1428   UROBILINOGEN 0.2 06/25/2020 1441   NITRITE NEGATIVE 05/22/2024 1428   LEUKOCYTESUR LARGE (A) 05/22/2024 1428   Sepsis Labs: @LABRCNTIP (procalcitonin:4,lacticidven:4) ) Recent Results (from the past 240 hours)  Culture, blood (routine x 2)  Status: None (Preliminary result)   Collection Time: 05/22/24 11:31 AM   Specimen: BLOOD  Result Value Ref Range Status   Specimen Description BLOOD BLOOD LEFT ARM  Final   Special Requests   Final    BOTTLES DRAWN AEROBIC AND ANAEROBIC Blood Culture adequate volume   Culture   Final    NO GROWTH 3 DAYS Performed at [facility], [address], [place], [place] [address]    Report Status PENDING  Incomplete  Culture, blood (routine x 2)     Status: None (Preliminary result)   Collection Time: 05/22/24 11:34 AM   Specimen: BLOOD  Result Value Ref Range Status   Specimen Description BLOOD BLOOD RIGHT HAND  Final   Special Requests   Final    BOTTLES DRAWN AEROBIC AND ANAEROBIC Blood Culture results may not be optimal due to an inadequate volume of blood received in culture bottles   Culture   Final    NO GROWTH 3 DAYS Performed at [facility], [address], [place], [place] [address]    Report Status PENDING  Incomplete  Urine Culture     Status: Abnormal   Collection Time: 05/22/24  2:28 PM   Specimen: Urine, Random  Result Value Ref Range Status   Specimen Description   Final    URINE, RANDOM Performed at [facility], [address], [place], [place] [address]    Special  Requests   Final    NONE Reflexed from [id number] Performed at [facility], [address], [place], [place] [address]    Culture (A)  Final    >=100,000 COLONIES/mL ENTEROCOCCUS FAECALIS VANCOMYCIN  RESISTANT ENTEROCOCCUS ISOLATED    Report Status 05/24/2024 FINAL  Final   Organism ID, Bacteria ENTEROCOCCUS FAECALIS (A)  Final      Susceptibility   Enterococcus faecalis - MIC*    AMPICILLIN <=2 SENSITIVE Sensitive     NITROFURANTOIN <=16 SENSITIVE Sensitive     VANCOMYCIN  >=32 RESISTANT Resistant     LINEZOLID  2 SENSITIVE Sensitive     * >=100,000 COLONIES/mL ENTEROCOCCUS FAECALIS  MRSA Next Gen by PCR, Nasal     Status: None   Collection Time: 05/22/24  3:37 PM   Specimen: Nasal Mucosa; Nasal Swab  Result Value Ref Range Status   MRSA by PCR Next Gen NOT DETECTED NOT DETECTED Final    Comment: (NOTE) The GeneXpert MRSA Assay (FDA approved for NASAL specimens only), is one component of a comprehensive MRSA colonization surveillance program. It is not intended to diagnose MRSA infection nor to guide or monitor treatment for MRSA infections. Test performance is not FDA approved in patients less than 63 years old. Performed at [facility], [address] [place], [place] [address]   Resp panel by RT-PCR (RSV, Flu A&B, Covid) Nasal Mucosa     Status: None   Collection Time: 05/22/24  3:37 PM   Specimen: Nasal Mucosa; Nasal Swab  Result Value Ref Range Status   SARS Coronavirus 2 by RT PCR NEGATIVE NEGATIVE Final    Comment: (NOTE) SARS-CoV-2 target nucleic acids are NOT DETECTED.  The SARS-CoV-2 RNA is generally detectable in upper respiratory specimens during the acute phase of infection. The lowest concentration of SARS-CoV-2 viral copies this assay can detect is 138 copies/mL. A negative result does not preclude SARS-Cov-2 infection and should not be used as the sole basis for treatment or other patient management decisions. A negative result may occur with  improper specimen  collection/handling, submission of specimen other than  nasopharyngeal swab, presence of viral mutation(s) within the areas targeted by this assay, and inadequate number of viral copies(<138 copies/mL). A negative result must be combined with clinical observations, patient history, and epidemiological information. The expected result is Negative.  Fact Sheet for Patients:  BloggerCourse.com  Fact Sheet for Healthcare Providers:  SeriousBroker.it  This test is no t yet approved or cleared by the [place]  FDA and  has been authorized for detection and/or diagnosis of SARS-CoV-2 by FDA under an Emergency Use Authorization (EUA). This EUA will remain  in effect (meaning this test can be used) for the duration of the COVID-19 declaration under Section 564(b)(1) of the Act, 21 U.S.C.section 360bbb-3(b)(1), unless the authorization is terminated  or revoked sooner.       Influenza A by PCR NEGATIVE NEGATIVE Final   Influenza B by PCR NEGATIVE NEGATIVE Final    Comment: (NOTE) The Xpert Xpress SARS-CoV-2/FLU/RSV plus assay is intended as an aid in the diagnosis of influenza from Nasopharyngeal swab specimens and should not be used as a sole basis for treatment. Nasal washings and aspirates are unacceptable for Xpert Xpress SARS-CoV-2/FLU/RSV testing.  Fact Sheet for Patients: BloggerCourse.com  Fact Sheet for Healthcare Providers: SeriousBroker.it  This test is not yet approved or cleared by the [place]  FDA and has been authorized for detection and/or diagnosis of SARS-CoV-2 by FDA under an Emergency Use Authorization (EUA). This EUA will remain in effect (meaning this test can be used) for the duration of the COVID-19 declaration under Section 564(b)(1) of the Act, 21 U.S.C. section 360bbb-3(b)(1), unless the authorization is terminated or revoked.     Resp Syncytial  Virus by PCR NEGATIVE NEGATIVE Final    Comment: (NOTE) Fact Sheet for Patients: BloggerCourse.com  Fact Sheet for Healthcare Providers: SeriousBroker.it  This test is not yet approved or cleared by the [place]  FDA and has been authorized for detection and/or diagnosis of SARS-CoV-2 by FDA under an Emergency Use Authorization (EUA). This EUA will remain in effect (meaning this test can be used) for the duration of the COVID-19 declaration under Section 564(b)(1) of the Act, 21 U.S.C. section 360bbb-3(b)(1), unless the authorization is terminated or revoked.  Performed at [facility], [address] [place], [place] [address]   Respiratory (~20 pathogens) panel by PCR     Status: None   Collection Time: 05/23/24 10:30 AM   Specimen: Nasopharyngeal Swab; Respiratory  Result Value Ref Range Status   Adenovirus NOT DETECTED NOT DETECTED Final   Coronavirus 229E NOT DETECTED NOT DETECTED Final    Comment: (NOTE) The Coronavirus on the Respiratory Panel, DOES NOT test for the novel  Coronavirus (2019 nCoV)    Coronavirus HKU1 NOT DETECTED NOT DETECTED Final   Coronavirus NL63 NOT DETECTED NOT DETECTED Final   Coronavirus OC43 NOT DETECTED NOT DETECTED Final   Metapneumovirus NOT DETECTED NOT DETECTED Final   Rhinovirus / Enterovirus NOT DETECTED NOT DETECTED Final   Influenza A NOT DETECTED NOT DETECTED Final   Influenza B NOT DETECTED NOT DETECTED Final   Parainfluenza Virus 1 NOT DETECTED NOT DETECTED Final   Parainfluenza Virus 2 NOT DETECTED NOT DETECTED Final   Parainfluenza Virus 3 NOT DETECTED NOT DETECTED Final   Parainfluenza Virus 4 NOT DETECTED NOT DETECTED Final   Respiratory Syncytial Virus NOT DETECTED NOT DETECTED Final   Bordetella pertussis NOT DETECTED NOT DETECTED Final   Bordetella Parapertussis NOT DETECTED NOT DETECTED Final   Chlamydophila pneumoniae NOT DETECTED NOT DETECTED Final  Mycoplasma  pneumoniae NOT DETECTED NOT DETECTED Final    Comment: Performed at [facility], [address] [place], [place] [address]     Scheduled Meds:  albuterol   2.5 mg Nebulization TID   arformoterol   15 mcg Nebulization BID   aspirin  EC  81 mg Oral Daily   atorvastatin   40 mg Oral Daily   azithromycin   500 mg Oral Daily   budesonide  (PULMICORT ) nebulizer solution  0.5 mg Nebulization BID   Chlorhexidine  Gluconate Cloth  6 each Topical Q0600   clopidogrel   75 mg Oral Daily   cycloSPORINE   1 drop Both Eyes BID   enoxaparin  (LOVENOX ) injection  40 mg Subcutaneous Q24H   feeding supplement  1 Container Oral TID BM   furosemide   40 mg Oral Daily   insulin  aspart  0-9 Units Subcutaneous Q4H   insulin  glargine-yfgn  10 Units Subcutaneous BID   levothyroxine   50 mcg Oral Daily   methylPREDNISolone  (SOLU-MEDROL ) injection  60 mg Intravenous Daily   midodrine   10 mg Oral TID WC   mirabegron  ER  25 mg Oral Daily   pantoprazole   40 mg Oral Daily   phosphorus  500 mg Oral BID   pregabalin   150 mg Oral BID   revefenacin   175 mcg Nebulization Daily   senna  2 tablet Oral QHS   Continuous Infusions:  ceFEPIme  (MAXIPIME ) 2 g in sodium chloride  0.9 % 100 mL IVPB 2 g (05/25/24 1027)   linezolid  (ZYVOX ) IV 600 mg (05/25/24 1104)   norepinephrine  (LEVOPHED ) Adult infusion 3 mcg/min (05/24/24 0158)    Procedures/Studies: CT Angio Chest Pulmonary Embolism (PE) W or WO Contrast Result Date: 05/22/2024 CLINICAL DATA:  Pulmonary embolism suspected, low to intermediate probability, negative D-dimer. Shortness of breath and respiratory failure. EXAM: CT ANGIOGRAPHY CHEST WITH CONTRAST TECHNIQUE: Multidetector CT imaging of the chest was performed using the standard protocol during bolus administration of intravenous contrast. Multiplanar CT image reconstructions and MIPs were obtained to evaluate the vascular anatomy. RADIATION DOSE REDUCTION: This exam was performed according to the departmental  dose-optimization program which includes automated exposure control, adjustment of the mA and/or kV according to patient size and/or use of iterative reconstruction technique. CONTRAST:  75mL OMNIPAQUE  IOHEXOL  350 MG/ML SOLN COMPARISON:  04/24/2024. FINDINGS: Cardiovascular: The heart is borderline enlarged and there is no pericardial effusion. There are calcifications in the mitral valve annulus. There is mild atherosclerotic calcification of the aorta without evidence of aneurysm. The pulmonary trunk is normal in caliber. There is no evidence of pulmonary embolism. Mediastinum/Nodes: No mediastinal, hilar, or axillary lymphadenopathy. The trachea and esophagus are within normal limits. Lungs/Pleura: There are small bilateral pleural effusions, greater on the left than on the right. Patchy atelectasis and consolidation is noted in the lower lobes bilaterally. A few scattered ground-glass opacities are noted in the upper lobes bilaterally. No pneumothorax is seen. There is a 3 mm nodule in the posterior segment of the left upper lobe, axial image 64. Upper Abdomen: The gallbladder is surgically absent. There is stable nodularity of the left adrenal gland, previously characterized as adenoma is. No acute abnormality is seen. Musculoskeletal: Degenerative changes are present in the thoracic spine. No acute osseous abnormality is seen. Review of the MIP images confirms the above findings. IMPRESSION: 1. No evidence of pulmonary embolism. 2. Scattered ground-glass opacities in the upper lobes bilaterally and patchy consolidation at the lung bases, suspicious for multifocal pneumonia, slightly increased at the left lung base. 3. Small bilateral pleural  effusions. 4. 3 mm left upper lobe pulmonary nodule. No follow-up needed if patient is low-risk.This recommendation follows the consensus statement: Guidelines for Management of Incidental Pulmonary Nodules Detected on CT Images: From the Fleischner Society 2017; Radiology  2017; 284:228-243. 5. Aortic atherosclerosis. Electronically Signed   By: Wyvonnia Heimlich M.D.   On: 05/22/2024 20:29   DG Chest Portable 1 View Result Date: 05/22/2024 CLINICAL DATA:  cough EXAM: PORTABLE CHEST - 1 VIEW COMPARISON:  May 10, 2024 FINDINGS: Low lung volumes. No focal airspace consolidation, pleural effusion, or pneumothorax. No cardiomegaly. Tortuous aorta with aortic atherosclerosis. No acute fracture or destructive lesion. Multilevel thoracic osteophytosis. Osteopenia. IMPRESSION: No acute cardiopulmonary abnormality. Electronically Signed   By: Rance Burrows M.D.   On: 05/22/2024 13:50   DG Chest Port 1 View Result Date: 05/10/2024 CLINICAL DATA:  Questionable sepsis EXAM: PORTABLE CHEST 1 VIEW COMPARISON:  Chest x-ray 04/26/2024.  Chest CT 04/24/2004. FINDINGS: The heart size and mediastinal contours are within normal limits. Both lungs are clear. The visualized skeletal structures are unremarkable. IMPRESSION: No active disease. Electronically Signed   By: Tyron Gallon M.D.   On: 05/10/2024 15:17   DG Swallowing Func-Speech Pathology Result Date: 04/29/2024 Table formatting from the original result was not included. Modified Barium Swallow Study Patient Details Name: ORALIA CRIGER MRN: [id number] Date of Birth: 04/02/1941 Today's Date: 04/29/2024 HPI/PMH: HPI: DOREENE FORREY is a 83 y.o. female with medical history significant for diabetes, hypertension, stroke, pituitary adenoma, PSVT, anxiety and depression.  Patient was brought to the ED from her group facility with reports of confusion that started today, also with cough, fever.   At the time of my evaluation, patient is lethargic, responds to direction but unable to answer questions or give history. Recent hospitalization 3/17 to 3/25 for acute/early subacute stroke with concomitant acute metabolic encephalopathy.  Was discharged to SNF.     Patient has been admitted with septic shock secondary to multifocal pneumonia along with  associated acute hypoxemic respiratory failure and acute metabolic encephalopathy. BSE requested. Clinical Impression: Patient presents with oropharyngeal swallowing to be essentially [facility]. Note one episode of flash frank penetration of thin liquids that was cleared during the swallow and occasional premature spillage of thin liquids penetrated before the swallow but was cleared during the swallow every time. Swallow is often triggered at the posterior angle of the ramus but sometimes is delayed until bolus fills the pyriform sinuses. Pharyngeal stripping wave is slightly diminished resulting in occasional trace pharyngeal residue. Note suspected small Zenker's Diverticulum (ZD) - no radiologist present to confirm. Residue in ZD with retrograde movement back up to the pyriforms. Repeat dry swallow clears retrograde residue from the pyriforms, however note it again collects in the ZD. No retrograde residue was penetrated or aspirated. Esophageal sweep was unremarkable. Recommend upgrade Pt's diet to D3/mech soft and continue with thin liquids. Recommend Pt utilize repeat swallow with every sip/bite to ensure trace/min retrograde residue is cleared from pharynx. Reviewed findings with patient and RN. There are no further ST needs noted at this time, our service will sign off. Factors that may increase risk of adverse event in presence of aspiration Roderick Civatte & Jessy Morocco 2021): No data recorded Recommendations/Plan: Swallowing Evaluation Recommendations Swallowing Evaluation Recommendations Recommendations: PO diet PO Diet Recommendation: Dysphagia 3 (Mechanical soft); Thin liquids (Level 0) Liquid Administration via: Cup; Straw Medication Administration: Whole meds with liquid Supervision: Patient able to self-feed Swallowing strategies  : Slow rate; Small bites/sips; Multiple dry swallows after  each bite/sip Postural changes: Position pt fully upright for meals Oral care recommendations: Oral care BID (2x/day) Treatment  Plan Treatment Plan Treatment recommendations: Therapy as outlined in treatment plan below Follow-up recommendations: No SLP follow up Interventions: Aspiration precaution training Recommendations Recommendations for follow up therapy are one component of a multi-disciplinary discharge planning process, led by the attending physician.  Recommendations may be updated based on patient status, additional functional criteria and insurance authorization. Assessment: Orofacial Exam: Orofacial Exam Oral Cavity: Oral Hygiene: WFL Oral Cavity - Dentition: Adequate natural dentition; Missing dentition Orofacial Anatomy: WFL Oral Motor/Sensory Function: WFL Anatomy: Anatomy: WFL Boluses Administered: Boluses Administered Boluses Administered: Thin liquids (Level 0); Mildly thick liquids (Level 2, nectar thick); Moderately thick liquids (Level 3, honey thick); Puree; Solid  Oral Impairment Domain: Oral Impairment Domain Lip Closure: No labial escape Tongue control during bolus hold: Not tested Bolus preparation/mastication: Timely and efficient chewing and mashing Bolus transport/lingual motion: Brisk tongue motion Oral residue: Complete oral clearance Initiation of pharyngeal swallow : Posterior angle of the ramus; Pyriform sinuses  Pharyngeal Impairment Domain: Pharyngeal Impairment Domain Soft palate elevation: No bolus between soft palate (SP)/pharyngeal wall (PW) Laryngeal elevation: Complete superior movement of thyroid  cartilage with complete approximation of arytenoids to epiglottic petiole Anterior hyoid excursion: Complete anterior movement Epiglottic movement: Complete inversion Laryngeal vestibule closure: Complete, no air/contrast in laryngeal vestibule; Incomplete, narrow column air/contrast in laryngeal vestibule Pharyngeal stripping wave : Present - complete Pharyngeal contraction (A/P view only): N/A Pharyngoesophageal segment opening: Complete distension and complete duration, no obstruction of flow Tongue  base retraction: No contrast between tongue base and posterior pharyngeal wall (PPW) Pharyngeal residue: Trace residue within or on pharyngeal structures Location of pharyngeal residue: Tongue base; Valleculae  Esophageal Impairment Domain: Esophageal Impairment Domain Esophageal clearance upright position: Esophageal retention with retrograde flow below pharyngoesophageal segment (PES) (small Zenker's Diverticulum) Pill: Pill Consistency administered: Thin liquids (Level 0) Thin liquids (Level 0): [facility] Penetration/Aspiration Scale Score: Penetration/Aspiration Scale Score 1.  Material does not enter airway: Thin liquids (Level 0); Mildly thick liquids (Level 2, nectar thick); Moderately thick liquids (Level 3, honey thick); Solid; Puree; Pill 2.  Material enters airway, remains ABOVE vocal cords then ejected out: Thin liquids (Level 0) Compensatory Strategies: Compensatory Strategies Compensatory strategies: Yes Multiple swallows: Effective   General Information: Caregiver present: No  Diet Prior to this Study: Dysphagia 2 (finely chopped); Thin liquids (Level 0)   Temperature : Normal   Respiratory Status: WFL   Supplemental O2: Nasal cannula   History of Recent Intubation: No  Behavior/Cognition: Alert; Cooperative; Pleasant mood; Confused Self-Feeding Abilities: Able to self-feed Baseline vocal quality/speech: Normal Volitional Cough: Able to elicit Volitional Swallow: Able to elicit No data recorded Goal Planning: No data recorded No data recorded No data recorded No data recorded No data recorded Pain: Pain Assessment Pain Assessment: No/denies pain End of Session: Start Time:SLP Start Time (ACUTE ONLY): 1121 Stop Time: SLP Stop Time (ACUTE ONLY): 1154 Time Calculation:SLP Time Calculation (min) (ACUTE ONLY): 33 min Charges: SLP Evaluations $ SLP Speech Visit: 1 Visit SLP Evaluations $MBS Swallow: 1 Procedure SLP visit diagnosis: SLP Visit Diagnosis: Dysphagia, unspecified (R13.10) Past Medical History: Past  Medical History: Diagnosis Date  Anxiety   Asthma   ASYMPTOMATIC POSTMENOPAUSAL STATUS 02/09/2009  Cataract   Constipation 03/19/2013  Depression   DIABETES MELLITUS, TYPE II 09/14/2007  HYPERCHOLESTEROLEMIA 02/09/2009  HYPERTENSION 02/09/2009  HYPOTHYROIDISM 09/14/2007  MIGRAINE HEADACHE 09/14/2007  Neuropathy   OSTEOPOROSIS 02/09/2009  PANCREATITIS 09/14/2007  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  PONV (postoperative nausea and vomiting)   Rectocele 03/19/2013  Shortness of breath   SUPERFICIAL PHLEBITIS 09/14/2007  Varicose veins  Past Surgical History: Past Surgical History: Procedure Laterality Date  ABDOMINAL HYSTERECTOMY    ANTERIOR AND POSTERIOR REPAIR N/A 04/02/2013  Procedure: ANTERIOR (CYSTOCELE) AND POSTERIOR REPAIR (RECTOCELE);  Surgeon: Albino Hum, MD;  Location: [facility];  Service: Gynecology;  Laterality: N/A;  APPENDECTOMY    BRAIN SURGERY    CATARACT EXTRACTION W/PHACO  12/05/2011  Procedure: CATARACT EXTRACTION PHACO AND INTRAOCULAR LENS PLACEMENT (IOC);  Surgeon: Anner Kill;  Location: [facility];  Service: Ophthalmology;  Laterality: Left;  CDE:9.65  CHOLECYSTECTOMY    CRANIOTOMY N/A 06/04/2020  Procedure: Endoscopic Transphenoidal Resection of Recurrent PituitaryTumor;  Surgeon: Elna Haggis, MD;  Location: [facility] OR;  Service: Neurosurgery;  Laterality: N/A;  CYSTOSCOPY    ENDOVENOUS ABLATION SAPHENOUS VEIN W/ LASER  11-03-2011   right greater saphenous vein  left leg done 10-2011  EYE SURGERY  98  right cataract extraction 98  LAPAROSCOPIC NISSEN FUNDOPLICATION    NM ESOPHAGEAL REFLUX  08-11-11  PITUITARY EXCISION  10/1997  POSTERIOR REPAIR    TRANSNASAL APPROACH N/A 06/04/2020  Procedure: TRANSNASAL APPROACH;  Surgeon: Ammon Bales, MD;  Location: [facility];  Service: ENT;  Laterality: N/A;  TRANSPHENOIDAL / TRANSNASAL HYPOPHYSECTOMY / RESECTION PITUITARY TUMOR  08-11-11 Florina Husbands 04/29/2024, 12:42 PM  ECHOCARDIOGRAM COMPLETE Result Date: 04/27/2024    ECHOCARDIOGRAM REPORT   Patient Name:   KAMILYA WAKEMAN  Date of Exam: 04/27/2024 Medical Rec #:  [id number]      Height:       65.0 in Accession #:    [id number]     Weight:       125.4 lb Date of Birth:  Jan 19, 1941       BSA:          1.622 m Patient Age:    82 years       BP:           91/54 mmHg Patient Gender: F              HR:           99 bpm. Exam Location:  [facility] Procedure: 2D Echo, Color Doppler and Cardiac Doppler (Both Spectral and Color            Flow Doppler were utilized during procedure). Indications:    I50.31 Acute diastolic (congestive) heart failure  History:        Patient has prior history of Echocardiogram examinations, most                 recent 03/13/2024. Risk Factors:Hypertension, Diabetes and                 Dyslipidemia.  Sonographer:    Sherline Distel Senior RDCS Referring Phys: [id number] PRATIK D [facility] IMPRESSIONS  1. Left ventricular ejection fraction, by estimation, is 60 to 65%. The left ventricle has normal function. The left ventricle has no regional wall motion abnormalities. Left ventricular diastolic parameters are indeterminate.  2. Right ventricular systolic function is normal. The right ventricular size is normal.  3. Left atrial size was mildly dilated.  4. MVA by PT1/2 1.23 cm2 with men gradient 12 peak 19 mmHg at HR of 101 bpm. The mitral valve is abnormal. Mild mitral valve regurgitation. Moderate mitral stenosis. Moderate mitral annular calcification.  5. The aortic valve is tricuspid. There is mild calcification of the aortic valve. There  is mild thickening of the aortic valve. Aortic valve regurgitation is trivial. Aortic valve sclerosis is present, with no evidence of aortic valve stenosis.  6. The inferior vena cava is normal in size with greater than 50% respiratory variability, suggesting right atrial pressure of 3 mmHg. FINDINGS  Left Ventricle: Left ventricular ejection fraction, by estimation, is 60 to 65%. The left ventricle has normal function. The left ventricle has no regional wall motion abnormalities. Strain was  performed and the global longitudinal strain is indeterminate. The left ventricular internal cavity size was normal in size. There is no left ventricular hypertrophy. Left ventricular diastolic parameters are indeterminate. Right Ventricle: The right ventricular size is normal. No increase in right ventricular wall thickness. Right ventricular systolic function is normal. Left Atrium: Left atrial size was mildly dilated. Right Atrium: Right atrial size was normal in size. Pericardium: There is no evidence of pericardial effusion. Mitral Valve: MVA by PT1/2 1.23 cm2 with men gradient 12 peak 19 mmHg at HR of 101 bpm. The mitral valve is abnormal. There is moderate thickening of the mitral valve leaflet(s). There is moderate calcification of the mitral valve leaflet(s). Moderate mitral annular calcification. Mild mitral valve regurgitation. Moderate mitral valve stenosis. MV peak gradient, 19.1 mmHg. The mean mitral valve gradient is 12.7 mmHg. Tricuspid Valve: The tricuspid valve is normal in structure. Tricuspid valve regurgitation is trivial. No evidence of tricuspid stenosis. Aortic Valve: The aortic valve is tricuspid. There is mild calcification of the aortic valve. There is mild thickening of the aortic valve. Aortic valve regurgitation is trivial. Aortic valve sclerosis is present, with no evidence of aortic valve stenosis. Pulmonic Valve: The pulmonic valve was normal in structure. Pulmonic valve regurgitation is not visualized. No evidence of pulmonic stenosis. Aorta: The aortic root is normal in size and structure. Venous: The inferior vena cava is normal in size with greater than 50% respiratory variability, suggesting right atrial pressure of 3 mmHg. IAS/Shunts: No atrial level shunt detected by color flow Doppler. Additional Comments: 3D was performed not requiring image post processing on an independent workstation and was indeterminate.  LEFT VENTRICLE PLAX 2D LVIDd:         3.70 cm LVIDs:         2.90  cm LV PW:         0.70 cm LV IVS:        0.80 cm LVOT diam:     1.80 cm LV SV:         55 LV SV Index:   34 LVOT Area:     2.54 cm  RIGHT VENTRICLE RV S prime:     13.80 cm/s TAPSE (M-mode): 1.8 cm LEFT ATRIUM             Index        RIGHT ATRIUM           Index LA diam:        3.80 cm 2.34 cm/m   RA Area:     13.30 cm LA Vol (A2C):   60.3 ml 37.17 ml/m  RA Volume:   28.90 ml  17.81 ml/m LA Vol (A4C):   45.0 ml 27.74 ml/m LA Biplane Vol: 55.6 ml 34.27 ml/m  AORTIC VALVE LVOT Vmax:   115.00 cm/s LVOT Vmean:  76.200 cm/s LVOT VTI:    0.216 m  AORTA Ao Root diam: 3.00 cm Ao Asc diam:  3.20 cm MITRAL VALVE MV Area VTI:  1.15 cm    SHUNTS MV  Peak grad: 19.1 mmHg   Systemic VTI:  0.22 m MV Mean grad: 12.7 mmHg   Systemic Diam: 1.80 cm MV Vmax:      2.18 m/s MV Vmean:     172.3 cm/s Janelle Mediate MD Electronically signed by Janelle Mediate MD Signature Date/Time: 04/27/2024/2:17:05 PM    Final    DG CHEST PORT 1 VIEW Result Date: 04/26/2024 CLINICAL DATA:  Hypoxemia. EXAM: PORTABLE CHEST 1 VIEW COMPARISON:  Chest radiograph dated 04/24/2024. FINDINGS: Diffuse interstitial and interlobular septal prominence and edema. Small bilateral pleural effusions. No pneumothorax. The cardiac silhouette is within limits. Atherosclerotic calcification of the aorta. No acute osseous pathology. IMPRESSION: Pulmonary edema with small bilateral pleural effusions. Electronically Signed   By: Angus Bark M.D.   On: 04/26/2024 13:25    Demaris Fillers, DO  [facility]  If 7PM-7AM, please contact night-coverage [email or web address] Password [email or web address] 05/25/2024, 12:51 PM   LOS: 3 days

## 2024-05-25 NOTE — Inpatient Diabetes Management (Signed)
 Inpatient Diabetes Program Recommendations  AACE/ADA: New Consensus Statement on Inpatient Glycemic Control (2015)  Target Ranges:  Prepandial:   less than 140 mg/dL      Peak postprandial:   less than 180 mg/dL (1-2 hours)      Critically ill patients:  140 - 180 mg/dL   Lab Results  Component Value Date   GLUCAP 75 05/25/2024   HGBA1C 7.0 (H) 04/25/2024    Review of Glycemic Control  Diabetes history: DM2 Outpatient Diabetes medications: metformin  500 BID Current orders for Inpatient glycemic control: Semglee  10 BID, Novolog  0-9 Q4H Solumedrol 60 mg daily  HgbA1C - 7.0% CBGs 75, 508W  Inpatient Diabetes Program Recommendations:    Decrease Semglee  to 10 units QAM (FBS low normal at 75 mg/dL)  Add Novolog  4 units TID with meals if eating > 50%  Will continue to follow.   Thank you. Joni Net, RD, LDN, CDCES Inpatient Diabetes Coordinator [phone]

## 2024-05-26 DIAGNOSIS — A419 Sepsis, unspecified organism: Secondary | ICD-10-CM | POA: Diagnosis not present

## 2024-05-26 DIAGNOSIS — J9601 Acute respiratory failure with hypoxia: Secondary | ICD-10-CM | POA: Diagnosis not present

## 2024-05-26 DIAGNOSIS — J690 Pneumonitis due to inhalation of food and vomit: Secondary | ICD-10-CM | POA: Diagnosis not present

## 2024-05-26 DIAGNOSIS — A4181 Sepsis due to Enterococcus: Secondary | ICD-10-CM | POA: Diagnosis not present

## 2024-05-26 LAB — BASIC METABOLIC PANEL WITH GFR
Anion gap: 9 (ref 5–15)
BUN: 17 mg/dL (ref 8–23)
CO2: 27 mmol/L (ref 22–32)
Calcium: 7.8 mg/dL — ABNORMAL LOW (ref 8.9–10.3)
Chloride: 102 mmol/L (ref 98–111)
Creatinine, Ser: 0.53 mg/dL (ref 0.44–1.00)
GFR, Estimated: >60 mL/min (ref >60)
Glucose, Bld: 117 mg/dL — ABNORMAL HIGH (ref 70–99)
Potassium: 3.6 mmol/L (ref 3.5–5.1)
Sodium: 138 mmol/L (ref 135–145)

## 2024-05-26 LAB — GLUCOSE, CAPILLARY
Glucose-Capillary: 107 mg/dL — ABNORMAL HIGH (ref 70–99)
Glucose-Capillary: 135 mg/dL — ABNORMAL HIGH (ref 70–99)
Glucose-Capillary: 331 mg/dL — ABNORMAL HIGH (ref 70–99)
Glucose-Capillary: 368 mg/dL — ABNORMAL HIGH (ref 70–99)
Glucose-Capillary: 391 mg/dL — ABNORMAL HIGH (ref 70–99)
Glucose-Capillary: 399 mg/dL — ABNORMAL HIGH (ref 70–99)

## 2024-05-26 LAB — PHOSPHORUS: Phosphorus: 2.6 mg/dL (ref 2.5–4.6)

## 2024-05-26 LAB — MAGNESIUM: Magnesium: 1.7 mg/dL (ref 1.7–2.4)

## 2024-05-26 MED ORDER — MAGNESIUM SULFATE 2 GM/50ML IV SOLN
2.0000 g | Freq: Once | INTRAVENOUS | Status: AC
  Administered 2024-05-26: 2 g via INTRAVENOUS
  Filled 2024-05-26: qty 50

## 2024-05-26 MED ORDER — FLUDROCORTISONE ACETATE 0.1 MG PO TABS
0.1000 mg | ORAL_TABLET | Freq: Every day | ORAL | Status: DC
  Administered 2024-05-26 – 2024-05-28 (×3): 0.1 mg via ORAL
  Filled 2024-05-26 (×4): qty 1

## 2024-05-26 MED ORDER — POTASSIUM CHLORIDE CRYS ER 20 MEQ PO TBCR
20.0000 meq | EXTENDED_RELEASE_TABLET | Freq: Once | ORAL | Status: AC
  Administered 2024-05-26: 20 meq via ORAL
  Filled 2024-05-26: qty 1

## 2024-05-26 MED ORDER — GERHARDT'S BUTT CREAM
TOPICAL_CREAM | Freq: Two times a day (BID) | CUTANEOUS | Status: DC
  Filled 2024-05-26: qty 60

## 2024-05-26 MED ORDER — PREDNISONE 20 MG PO TABS
40.0000 mg | ORAL_TABLET | Freq: Every day | ORAL | Status: DC
  Administered 2024-05-27: 40 mg via ORAL
  Filled 2024-05-26: qty 2

## 2024-05-26 NOTE — Progress Notes (Signed)
 PROGRESS NOTE  [name] White OZH:[id number] DOB: 12/11/1941 DOA: 05/22/2024 PCP: Omie Bickers, MD  Brief History:  83 year old female with a history of diabetes mellitus type 2, stroke, hypertension, COPD, hypothyroidism, hyperlipidemia, anxiety, PSVT presenting with coughing, chest congestion, shortness of breath.  The patient denies any fevers, chills, chest pain, nausea, vomiting or diarrhea.  She denies any hemoptysis.  She was recently admitted to the hospital from 05/10/2024 to 05/15/2024.  During that hospitalization, the patient was treated for COPD exacerbation and what was felt to be atypical pneumonia.  She was discharged with a prednisone  taper.  In addition, the patient was also admitted from 04/24/2024 to 05/01/2024 with septic shock secondary pneumonia. She has had increasing generalized weakness since discharge from the hospital.  There is no hematochezia or melena.  She does complain of some dysuria for the past week. In the ED, the patient was afebrile with soft blood pressures in the low 90s and upper 80s.  WBC 17.1, hemoglobin 12.5, platelet 236.  Sodium 130, potassium 4.2, bicarbonate 26, serum creatinine 0.82.  LFTs were unremarkable.  Chest x-ray showed chronic interstitial markings.  Lactic acid 2.8.  The patient was started on linezolid  and cefepime .   Assessment/Plan: Severe sepsis - Presented with leukocytosis, lactic acid 2.8, low BPs - Secondary to pneumonia and UTI - 05/10/24 UA >50 WBC - 05/10/24 urine culture = VRE - Continue linezolid  - continue cefepime  - Continue azithromycin  - remains on levophed  1 mg/kg/min - increased midodrine  to 10 mg tid - add florinef 0.1 mg   VRE UTI - Continue linezolid    Acute respiratory failure with hypoxia - Secondary to COPD exacerbation, pneumonia - Presented with oxygen saturation 88% on room air - Stable on 2 L>>weaned to RA - Wean oxygen as tolerated - given lasix  IV x 1>>start lasix  40 mg po daily -  COVID/RSV/Flu--neg - viral respiratory panel--neg - CTA chest--neg PE;  scattered GGO bilateral UL.  Bibasilar patchy consolidation   COPD exacerbation - Continue Solu-Medrol >>wean to prednisone  6/1 - Continue DuoNebs - Continue Brovana  - Continue Pulmicort  - continue Yupelri    Hyperosmolar nonketotic state -due to steroids -serum glucose up to 687 -5/29 started insulin  drip>>transition to [facility] insulin  5/30 -start semglee  10 units and Novolog  q 4 hour -add novolog  4 units with meals -advised family not to bring outside food   Hyperlipidemia - Continue statin   Controlled diabetes mellitus type 2 with hyperglycemia - 04/25/2024 hemoglobin A1c 7.0 - Holding metformin  - NovoLog  sliding scale   Chronic pain - Continue Lyrica    History of stroke - Continue aspirin , Plavix , statin   Depression/anxiety - Hold Trintellix  and venlafaxine  temporarily while getting linezolid    Hypothyroidism - Continue Synthroid    Aspiration pneumonitis -continue abx -speech therapy eval appreciated>>dys 3 with thine   Hypomagnesemia/hypophosphatemia -repleted -add kphos               Family Communication:   daughter at bedside 6/1   Consultants:  none   Code Status:  DNR   DVT Prophylaxis:  [place] Lovenox      Procedures: As Listed in Progress Note Above   Antibiotics: Zyvox  5/28>> Cefepime  5/28>> Azithro 5/28>>           Subjective: Patient denies fevers, chills, headache, chest pain, dyspnea, nausea, vomiting, diarrhea, abdominal pain, dysuria, hematuria, hematochezia, and melena.   Objective: Vitals:   05/26/24 0600 05/26/24 0723 05/26/24 0839 05/26/24 1100  BP: (!) 150/56  Pulse: 60     Resp: 14     Temp:  97.7 F (36.5 C)  (!) 97.5 F (36.4 C)  TempSrc:  Oral  Oral  SpO2: 96%  99%   Weight:      Height:        Intake/Output Summary (Last 24 hours) at 05/26/2024 1408 Last data filed at 05/26/2024 1308 Gross per 24 hour  Intake 712.69 ml  Output 700  ml  Net 12.69 ml   Weight change:  Exam:  General:  Pt is alert, follows commands appropriately, not in acute distress HEENT: No icterus, No thrush, No neck mass, [name]/AT Cardiovascular: RRR, S1/S2, no rubs, no gallops Respiratory: bibasilar crackles.  No wheeze Abdomen: Soft/+BS, non tender, non distended, no guarding Extremities: No edema, No lymphangitis, No petechiae, No rashes, no synovitis   Data Reviewed: I have personally reviewed following labs and imaging studies Basic Metabolic Panel: Recent Labs  Lab 05/23/24 0411 05/23/24 1649 05/24/24 0121 05/24/24 0554 05/24/24 0835 05/25/24 0300 05/26/24 0550  NA 132*   < > 133* 135 136 139 138  K 4.2   < > 3.8 3.7 3.6 3.9 3.6  CL 96*   < > 98 100 100 103 102  CO2 24   < > 25 25 27 26 27   GLUCOSE 469*   < > 171* 216* 98 208* 117*  BUN 20   < > 27* 25* 23 21 17   CREATININE 0.64   < > 0.71 0.71 0.64 0.61 0.53  CALCIUM  7.9*   < > 8.3* 8.2* 8.1* 8.0* 7.8*  MG 1.6*  --   --  1.9  --  1.8 1.7  PHOS  --   --   --   --   --  2.4* 2.6   < > = values in this interval not displayed.   Liver Function Tests: Recent Labs  Lab 05/22/24 1134  AST 16  ALT 33  ALKPHOS 51  BILITOT 1.0  PROT 6.2*  ALBUMIN 2.9*   No results for input(s): "LIPASE", "AMYLASE" in the last 168 hours. No results for input(s): "AMMONIA" in the last 168 hours. Coagulation Profile: No results for input(s): "INR", "PROTIME" in the last 168 hours. CBC: Recent Labs  Lab 05/22/24 1134 05/23/24 0411 05/24/24 0428  WBC 17.1* 16.9* 28.1*  NEUTROABS 13.5*  --   --   HGB 12.5 13.4 13.5  HCT 37.6 39.6 41.9  MCV 90.4 88.8 91.7  PLT 236 271 278   Cardiac Enzymes: No results for input(s): "CKTOTAL", "CKMB", "CKMBINDEX", "TROPONINI" in the last 168 hours. BNP: Invalid input(s): "POCBNP" CBG: Recent Labs  Lab 05/25/24 2013 05/25/24 2350 05/26/24 0438 05/26/24 0724 05/26/24 1104  GLUCAP 250* 328* 135* 107* 331*   HbA1C: No results for input(s):  "HGBA1C" in the last 72 hours. Urine analysis:    Component Value Date/Time   COLORURINE YELLOW 05/22/2024 1428   APPEARANCEUR HAZY (A) 05/22/2024 1428   LABSPEC 1.011 05/22/2024 1428   PHURINE 7.0 05/22/2024 1428   GLUCOSEU >=500 (A) 05/22/2024 1428   GLUCOSEU >=1000 (A) 06/25/2020 1441   HGBUR NEGATIVE 05/22/2024 1428   BILIRUBINUR NEGATIVE 05/22/2024 1428   BILIRUBINUR neg 03/19/2013 1512   KETONESUR NEGATIVE 05/22/2024 1428   PROTEINUR NEGATIVE 05/22/2024 1428   UROBILINOGEN 0.2 06/25/2020 1441   NITRITE NEGATIVE 05/22/2024 1428   LEUKOCYTESUR LARGE (A) 05/22/2024 1428   Sepsis Labs: @LABRCNTIP (procalcitonin:4,lacticidven:4) ) Recent Results (from the past 240 hours)  Culture, blood (routine x 2)  Status: None (Preliminary result)   Collection Time: 05/22/24 11:31 AM   Specimen: BLOOD  Result Value Ref Range Status   Specimen Description BLOOD BLOOD LEFT ARM  Final   Special Requests   Final    BOTTLES DRAWN AEROBIC AND ANAEROBIC Blood Culture adequate volume   Culture   Final    NO GROWTH 4 DAYS Performed at [facility], [address] [place], [place] [address]    Report Status PENDING  Incomplete  Culture, blood (routine x 2)     Status: None (Preliminary result)   Collection Time: 05/22/24 11:34 AM   Specimen: BLOOD  Result Value Ref Range Status   Specimen Description BLOOD BLOOD RIGHT HAND  Final   Special Requests   Final    BOTTLES DRAWN AEROBIC AND ANAEROBIC Blood Culture results may not be optimal due to an inadequate volume of blood received in culture bottles   Culture   Final    NO GROWTH 4 DAYS Performed at [facility], [address], [place], [place] [address]    Report Status PENDING  Incomplete  Urine Culture     Status: Abnormal   Collection Time: 05/22/24  2:28 PM   Specimen: Urine, Random  Result Value Ref Range Status   Specimen Description   Final    URINE, RANDOM Performed at [facility], [address], [place], [place] [address]     Special Requests   Final    NONE Reflexed from [id number] Performed at [facility], [address], [place], [place] [address]    Culture (A)  Final    >=100,000 COLONIES/mL ENTEROCOCCUS FAECALIS VANCOMYCIN  RESISTANT ENTEROCOCCUS ISOLATED    Report Status 05/24/2024 FINAL  Final   Organism ID, Bacteria ENTEROCOCCUS FAECALIS (A)  Final      Susceptibility   Enterococcus faecalis - MIC*    AMPICILLIN <=2 SENSITIVE Sensitive     NITROFURANTOIN <=16 SENSITIVE Sensitive     VANCOMYCIN  >=32 RESISTANT Resistant     LINEZOLID  2 SENSITIVE Sensitive     * >=100,000 COLONIES/mL ENTEROCOCCUS FAECALIS  MRSA Next Gen by PCR, Nasal     Status: None   Collection Time: 05/22/24  3:37 PM   Specimen: Nasal Mucosa; Nasal Swab  Result Value Ref Range Status   MRSA by PCR Next Gen NOT DETECTED NOT DETECTED Final    Comment: (NOTE) The GeneXpert MRSA Assay (FDA approved for NASAL specimens only), is one component of a comprehensive MRSA colonization surveillance program. It is not intended to diagnose MRSA infection nor to guide or monitor treatment for MRSA infections. Test performance is not FDA approved in patients less than 43 years old. Performed at [facility], [address] [place], [place] [address]   Resp panel by RT-PCR (RSV, Flu A&B, Covid) Nasal Mucosa     Status: None   Collection Time: 05/22/24  3:37 PM   Specimen: Nasal Mucosa; Nasal Swab  Result Value Ref Range Status   SARS Coronavirus 2 by RT PCR NEGATIVE NEGATIVE Final    Comment: (NOTE) SARS-CoV-2 target nucleic acids are NOT DETECTED.  The SARS-CoV-2 RNA is generally detectable in upper respiratory specimens during the acute phase of infection. The lowest concentration of SARS-CoV-2 viral copies this assay can detect is 138 copies/mL. A negative result does not preclude SARS-Cov-2 infection and should not be used as the sole basis for treatment or other patient management decisions. A negative result may occur with   improper specimen collection/handling, submission of specimen other than  nasopharyngeal swab, presence of viral mutation(s) within the areas targeted by this assay, and inadequate number of viral copies(<138 copies/mL). A negative result must be combined with clinical observations, patient history, and epidemiological information. The expected result is Negative.  Fact Sheet for Patients:  BloggerCourse.com  Fact Sheet for Healthcare Providers:  SeriousBroker.it  This test is no t yet approved or cleared by the [place]  FDA and  has been authorized for detection and/or diagnosis of SARS-CoV-2 by FDA under an Emergency Use Authorization (EUA). This EUA will remain  in effect (meaning this test can be used) for the duration of the COVID-19 declaration under Section 564(b)(1) of the Act, 21 U.S.C.section 360bbb-3(b)(1), unless the authorization is terminated  or revoked sooner.       Influenza A by PCR NEGATIVE NEGATIVE Final   Influenza B by PCR NEGATIVE NEGATIVE Final    Comment: (NOTE) The Xpert Xpress SARS-CoV-2/FLU/RSV plus assay is intended as an aid in the diagnosis of influenza from Nasopharyngeal swab specimens and should not be used as a sole basis for treatment. Nasal washings and aspirates are unacceptable for Xpert Xpress SARS-CoV-2/FLU/RSV testing.  Fact Sheet for Patients: BloggerCourse.com  Fact Sheet for Healthcare Providers: SeriousBroker.it  This test is not yet approved or cleared by the [place]  FDA and has been authorized for detection and/or diagnosis of SARS-CoV-2 by FDA under an Emergency Use Authorization (EUA). This EUA will remain in effect (meaning this test can be used) for the duration of the COVID-19 declaration under Section 564(b)(1) of the Act, 21 U.S.C. section 360bbb-3(b)(1), unless the authorization is terminated or revoked.      Resp Syncytial Virus by PCR NEGATIVE NEGATIVE Final    Comment: (NOTE) Fact Sheet for Patients: BloggerCourse.com  Fact Sheet for Healthcare Providers: SeriousBroker.it  This test is not yet approved or cleared by the [place]  FDA and has been authorized for detection and/or diagnosis of SARS-CoV-2 by FDA under an Emergency Use Authorization (EUA). This EUA will remain in effect (meaning this test can be used) for the duration of the COVID-19 declaration under Section 564(b)(1) of the Act, 21 U.S.C. section 360bbb-3(b)(1), unless the authorization is terminated or revoked.  Performed at [facility], [address] [place], [place] [address]   Respiratory (~20 pathogens) panel by PCR     Status: None   Collection Time: 05/23/24 10:30 AM   Specimen: Nasopharyngeal Swab; Respiratory  Result Value Ref Range Status   Adenovirus NOT DETECTED NOT DETECTED Final   Coronavirus 229E NOT DETECTED NOT DETECTED Final    Comment: (NOTE) The Coronavirus on the Respiratory Panel, DOES NOT test for the novel  Coronavirus (2019 nCoV)    Coronavirus HKU1 NOT DETECTED NOT DETECTED Final   Coronavirus NL63 NOT DETECTED NOT DETECTED Final   Coronavirus OC43 NOT DETECTED NOT DETECTED Final   Metapneumovirus NOT DETECTED NOT DETECTED Final   Rhinovirus / Enterovirus NOT DETECTED NOT DETECTED Final   Influenza A NOT DETECTED NOT DETECTED Final   Influenza B NOT DETECTED NOT DETECTED Final   Parainfluenza Virus 1 NOT DETECTED NOT DETECTED Final   Parainfluenza Virus 2 NOT DETECTED NOT DETECTED Final   Parainfluenza Virus 3 NOT DETECTED NOT DETECTED Final   Parainfluenza Virus 4 NOT DETECTED NOT DETECTED Final   Respiratory Syncytial Virus NOT DETECTED NOT DETECTED Final   Bordetella pertussis NOT DETECTED NOT DETECTED Final   Bordetella Parapertussis NOT DETECTED NOT DETECTED Final   Chlamydophila pneumoniae NOT DETECTED NOT DETECTED Final  Mycoplasma pneumoniae NOT DETECTED NOT DETECTED Final    Comment: Performed at [facility], [address] [place], [place] [address]     Scheduled Meds:  albuterol   2.5 mg Nebulization TID   arformoterol   15 mcg Nebulization BID   aspirin  EC  81 mg Oral Daily   atorvastatin   40 mg Oral Daily   azithromycin   500 mg Oral Daily   budesonide  (PULMICORT ) nebulizer solution  0.5 mg Nebulization BID   Chlorhexidine  Gluconate Cloth  6 each Topical Q0600   clopidogrel   75 mg Oral Daily   cycloSPORINE   1 drop Both Eyes BID   enoxaparin  (LOVENOX ) injection  40 mg Subcutaneous Q24H   feeding supplement  1 Container Oral TID BM   fludrocortisone  0.1 mg Oral Daily   furosemide   40 mg Oral Daily   hydrocortisone  25 mg Rectal BID   insulin  aspart  0-9 Units Subcutaneous Q4H   insulin  aspart  6 Units Subcutaneous TID WC   insulin  glargine-yfgn  10 Units Subcutaneous Daily   levothyroxine   50 mcg Oral Daily   midodrine   10 mg Oral TID WC   mirabegron  ER  25 mg Oral Daily   pantoprazole   40 mg Oral Daily   phosphorus  500 mg Oral BID   polyethylene glycol  17 g Oral BID   predniSONE   50 mg Oral Q breakfast   pregabalin   150 mg Oral BID   revefenacin   175 mcg Nebulization Daily   senna  2 tablet Oral QHS   Continuous Infusions:  ceFEPIme  (MAXIPIME ) 2 g in sodium chloride  0.9 % 100 mL IVPB 2 g (05/26/24 0945)   linezolid  (ZYVOX ) IV 600 mg (05/26/24 1131)   magnesium  sulfate bolus IVPB     norepinephrine  (LEVOPHED ) Adult infusion 1 mcg/min (05/26/24 1132)    Procedures/Studies: CT Angio Chest Pulmonary Embolism (PE) W or WO Contrast Result Date: 05/22/2024 CLINICAL DATA:  Pulmonary embolism suspected, low to intermediate probability, negative D-dimer. Shortness of breath and respiratory failure. EXAM: CT ANGIOGRAPHY CHEST WITH CONTRAST TECHNIQUE: Multidetector CT imaging of the chest was performed using the standard protocol during bolus administration of intravenous contrast.  Multiplanar CT image reconstructions and MIPs were obtained to evaluate the vascular anatomy. RADIATION DOSE REDUCTION: This exam was performed according to the departmental dose-optimization program which includes automated exposure control, adjustment of the mA and/or kV according to patient size and/or use of iterative reconstruction technique. CONTRAST:  75mL OMNIPAQUE  IOHEXOL  350 MG/ML SOLN COMPARISON:  04/24/2024. FINDINGS: Cardiovascular: The heart is borderline enlarged and there is no pericardial effusion. There are calcifications in the mitral valve annulus. There is mild atherosclerotic calcification of the aorta without evidence of aneurysm. The pulmonary trunk is normal in caliber. There is no evidence of pulmonary embolism. Mediastinum/Nodes: No mediastinal, hilar, or axillary lymphadenopathy. The trachea and esophagus are within normal limits. Lungs/Pleura: There are small bilateral pleural effusions, greater on the left than on the right. Patchy atelectasis and consolidation is noted in the lower lobes bilaterally. A few scattered ground-glass opacities are noted in the upper lobes bilaterally. No pneumothorax is seen. There is a 3 mm nodule in the posterior segment of the left upper lobe, axial image 64. Upper Abdomen: The gallbladder is surgically absent. There is stable nodularity of the left adrenal gland, previously characterized as adenoma is. No acute abnormality is seen. Musculoskeletal: Degenerative changes are present in the thoracic spine. No acute osseous abnormality is seen. Review of the MIP images confirms  the above findings. IMPRESSION: 1. No evidence of pulmonary embolism. 2. Scattered ground-glass opacities in the upper lobes bilaterally and patchy consolidation at the lung bases, suspicious for multifocal pneumonia, slightly increased at the left lung base. 3. Small bilateral pleural effusions. 4. 3 mm left upper lobe pulmonary nodule. No follow-up needed if patient is  low-risk.This recommendation follows the consensus statement: Guidelines for Management of Incidental Pulmonary Nodules Detected on CT Images: From the Fleischner Society 2017; Radiology 2017; 284:228-243. 5. Aortic atherosclerosis. Electronically Signed   By: Wyvonnia Heimlich M.D.   On: 05/22/2024 20:29   DG Chest Portable 1 View Result Date: 05/22/2024 CLINICAL DATA:  cough EXAM: PORTABLE CHEST - 1 VIEW COMPARISON:  May 10, 2024 FINDINGS: Low lung volumes. No focal airspace consolidation, pleural effusion, or pneumothorax. No cardiomegaly. Tortuous aorta with aortic atherosclerosis. No acute fracture or destructive lesion. Multilevel thoracic osteophytosis. Osteopenia. IMPRESSION: No acute cardiopulmonary abnormality. Electronically Signed   By: Rance Burrows M.D.   On: 05/22/2024 13:50   DG Chest Port 1 View Result Date: 05/10/2024 CLINICAL DATA:  Questionable sepsis EXAM: PORTABLE CHEST 1 VIEW COMPARISON:  Chest x-ray 04/26/2024.  Chest CT 04/24/2004. FINDINGS: The heart size and mediastinal contours are within normal limits. Both lungs are clear. The visualized skeletal structures are unremarkable. IMPRESSION: No active disease. Electronically Signed   By: Tyron Gallon M.D.   On: 05/10/2024 15:17   DG Swallowing Func-Speech Pathology Result Date: 04/29/2024 Table formatting from the original result was not included. Modified Barium Swallow Study Patient Details Name: SELAM PIETSCH MRN: [id number] Date of Birth: Jul 09, 1941 Today's Date: 04/29/2024 HPI/PMH: HPI: ADI SEALES is a 83 y.o. female with medical history significant for diabetes, hypertension, stroke, pituitary adenoma, PSVT, anxiety and depression.  Patient was brought to the ED from her group facility with reports of confusion that started today, also with cough, fever.   At the time of my evaluation, patient is lethargic, responds to direction but unable to answer questions or give history. Recent hospitalization 3/17 to 3/25 for acute/early  subacute stroke with concomitant acute metabolic encephalopathy.  Was discharged to SNF.     Patient has been admitted with septic shock secondary to multifocal pneumonia along with associated acute hypoxemic respiratory failure and acute metabolic encephalopathy. BSE requested. Clinical Impression: Patient presents with oropharyngeal swallowing to be essentially [facility]. Note one episode of flash frank penetration of thin liquids that was cleared during the swallow and occasional premature spillage of thin liquids penetrated before the swallow but was cleared during the swallow every time. Swallow is often triggered at the posterior angle of the ramus but sometimes is delayed until bolus fills the pyriform sinuses. Pharyngeal stripping wave is slightly diminished resulting in occasional trace pharyngeal residue. Note suspected small Zenker's Diverticulum (ZD) - no radiologist present to confirm. Residue in ZD with retrograde movement back up to the pyriforms. Repeat dry swallow clears retrograde residue from the pyriforms, however note it again collects in the ZD. No retrograde residue was penetrated or aspirated. Esophageal sweep was unremarkable. Recommend upgrade Pt's diet to D3/mech soft and continue with thin liquids. Recommend Pt utilize repeat swallow with every sip/bite to ensure trace/min retrograde residue is cleared from pharynx. Reviewed findings with patient and RN. There are no further ST needs noted at this time, our service will sign off. Factors that may increase risk of adverse event in presence of aspiration Roderick Civatte & Jessy Morocco 2021): No data recorded Recommendations/Plan: Swallowing Evaluation Recommendations Swallowing Evaluation Recommendations Recommendations:  PO diet PO Diet Recommendation: Dysphagia 3 (Mechanical soft); Thin liquids (Level 0) Liquid Administration via: Cup; Straw Medication Administration: Whole meds with liquid Supervision: Patient able to self-feed Swallowing strategies  : Slow  rate; Small bites/sips; Multiple dry swallows after each bite/sip Postural changes: Position pt fully upright for meals Oral care recommendations: Oral care BID (2x/day) Treatment Plan Treatment Plan Treatment recommendations: Therapy as outlined in treatment plan below Follow-up recommendations: No SLP follow up Interventions: Aspiration precaution training Recommendations Recommendations for follow up therapy are one component of a multi-disciplinary discharge planning process, led by the attending physician.  Recommendations may be updated based on patient status, additional functional criteria and insurance authorization. Assessment: Orofacial Exam: Orofacial Exam Oral Cavity: Oral Hygiene: WFL Oral Cavity - Dentition: Adequate natural dentition; Missing dentition Orofacial Anatomy: WFL Oral Motor/Sensory Function: WFL Anatomy: Anatomy: WFL Boluses Administered: Boluses Administered Boluses Administered: Thin liquids (Level 0); Mildly thick liquids (Level 2, nectar thick); Moderately thick liquids (Level 3, honey thick); Puree; Solid  Oral Impairment Domain: Oral Impairment Domain Lip Closure: No labial escape Tongue control during bolus hold: Not tested Bolus preparation/mastication: Timely and efficient chewing and mashing Bolus transport/lingual motion: Brisk tongue motion Oral residue: Complete oral clearance Initiation of pharyngeal swallow : Posterior angle of the ramus; Pyriform sinuses  Pharyngeal Impairment Domain: Pharyngeal Impairment Domain Soft palate elevation: No bolus between soft palate (SP)/pharyngeal wall (PW) Laryngeal elevation: Complete superior movement of thyroid  cartilage with complete approximation of arytenoids to epiglottic petiole Anterior hyoid excursion: Complete anterior movement Epiglottic movement: Complete inversion Laryngeal vestibule closure: Complete, no air/contrast in laryngeal vestibule; Incomplete, narrow column air/contrast in laryngeal vestibule Pharyngeal stripping  wave : Present - complete Pharyngeal contraction (A/P view only): N/A Pharyngoesophageal segment opening: Complete distension and complete duration, no obstruction of flow Tongue base retraction: No contrast between tongue base and posterior pharyngeal wall (PPW) Pharyngeal residue: Trace residue within or on pharyngeal structures Location of pharyngeal residue: Tongue base; Valleculae  Esophageal Impairment Domain: Esophageal Impairment Domain Esophageal clearance upright position: Esophageal retention with retrograde flow below pharyngoesophageal segment (PES) (small Zenker's Diverticulum) Pill: Pill Consistency administered: Thin liquids (Level 0) Thin liquids (Level 0): [facility] Penetration/Aspiration Scale Score: Penetration/Aspiration Scale Score 1.  Material does not enter airway: Thin liquids (Level 0); Mildly thick liquids (Level 2, nectar thick); Moderately thick liquids (Level 3, honey thick); Solid; Puree; Pill 2.  Material enters airway, remains ABOVE vocal cords then ejected out: Thin liquids (Level 0) Compensatory Strategies: Compensatory Strategies Compensatory strategies: Yes Multiple swallows: Effective   General Information: Caregiver present: No  Diet Prior to this Study: Dysphagia 2 (finely chopped); Thin liquids (Level 0)   Temperature : Normal   Respiratory Status: WFL   Supplemental O2: Nasal cannula   History of Recent Intubation: No  Behavior/Cognition: Alert; Cooperative; Pleasant mood; Confused Self-Feeding Abilities: Able to self-feed Baseline vocal quality/speech: Normal Volitional Cough: Able to elicit Volitional Swallow: Able to elicit No data recorded Goal Planning: No data recorded No data recorded No data recorded No data recorded No data recorded Pain: Pain Assessment Pain Assessment: No/denies pain End of Session: Start Time:SLP Start Time (ACUTE ONLY): 1121 Stop Time: SLP Stop Time (ACUTE ONLY): 1154 Time Calculation:SLP Time Calculation (min) (ACUTE ONLY): 33 min Charges: SLP  Evaluations $ SLP Speech Visit: 1 Visit SLP Evaluations $MBS Swallow: 1 Procedure SLP visit diagnosis: SLP Visit Diagnosis: Dysphagia, unspecified (R13.10) Past Medical History: Past Medical History: Diagnosis Date  Anxiety   Asthma   ASYMPTOMATIC POSTMENOPAUSAL STATUS  02/09/2009  Cataract   Constipation 03/19/2013  Depression   DIABETES MELLITUS, TYPE II 09/14/2007  HYPERCHOLESTEROLEMIA 02/09/2009  HYPERTENSION 02/09/2009  HYPOTHYROIDISM 09/14/2007  MIGRAINE HEADACHE 09/14/2007  Neuropathy   OSTEOPOROSIS 02/09/2009  PANCREATITIS 09/14/2007  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  PONV (postoperative nausea and vomiting)   Rectocele 03/19/2013  Shortness of breath   SUPERFICIAL PHLEBITIS 09/14/2007  Varicose veins  Past Surgical History: Past Surgical History: Procedure Laterality Date  ABDOMINAL HYSTERECTOMY    ANTERIOR AND POSTERIOR REPAIR N/A 04/02/2013  Procedure: ANTERIOR (CYSTOCELE) AND POSTERIOR REPAIR (RECTOCELE);  Surgeon: Albino Hum, MD;  Location: [facility];  Service: Gynecology;  Laterality: N/A;  APPENDECTOMY    BRAIN SURGERY    CATARACT EXTRACTION W/PHACO  12/05/2011  Procedure: CATARACT EXTRACTION PHACO AND INTRAOCULAR LENS PLACEMENT (IOC);  Surgeon: Anner Kill;  Location: [facility];  Service: Ophthalmology;  Laterality: Left;  CDE:9.65  CHOLECYSTECTOMY    CRANIOTOMY N/A 06/04/2020  Procedure: Endoscopic Transphenoidal Resection of Recurrent PituitaryTumor;  Surgeon: Elna Haggis, MD;  Location: [facility];  Service: Neurosurgery;  Laterality: N/A;  CYSTOSCOPY    ENDOVENOUS ABLATION SAPHENOUS VEIN W/ LASER  11-03-2011   right greater saphenous vein  left leg done 10-2011  EYE SURGERY  98  right cataract extraction 98  LAPAROSCOPIC NISSEN FUNDOPLICATION    NM ESOPHAGEAL REFLUX  08-11-11  PITUITARY EXCISION  10/1997  POSTERIOR REPAIR    TRANSNASAL APPROACH N/A 06/04/2020  Procedure: TRANSNASAL APPROACH;  Surgeon: Ammon Bales, MD;  Location: [facility] OR;  Service: ENT;  Laterality: N/A;  TRANSPHENOIDAL / TRANSNASAL HYPOPHYSECTOMY /  RESECTION PITUITARY TUMOR  08-11-11 Florina Husbands 04/29/2024, 12:42 PM  ECHOCARDIOGRAM COMPLETE Result Date: 04/27/2024    ECHOCARDIOGRAM REPORT   Patient Name:   ECKO BEASLEY Date of Exam: 04/27/2024 Medical Rec #:  [id number]      Height:       65.0 in Accession #:    [id number]     Weight:       125.4 lb Date of Birth:  13-Jul-1941       BSA:          1.622 m Patient Age:    82 years       BP:           91/54 mmHg Patient Gender: F              HR:           99 bpm. Exam Location:  [facility] Procedure: 2D Echo, Color Doppler and Cardiac Doppler (Both Spectral and Color            Flow Doppler were utilized during procedure). Indications:    I50.31 Acute diastolic (congestive) heart failure  History:        Patient has prior history of Echocardiogram examinations, most                 recent 03/13/2024. Risk Factors:Hypertension, Diabetes and                 Dyslipidemia.  Sonographer:    Sherline Distel Senior RDCS Referring Phys: [id number] PRATIK D [facility] IMPRESSIONS  1. Left ventricular ejection fraction, by estimation, is 60 to 65%. The left ventricle has normal function. The left ventricle has no regional wall motion abnormalities. Left ventricular diastolic parameters are indeterminate.  2. Right ventricular systolic function is normal. The right ventricular size is normal.  3. Left atrial size was mildly dilated.  4. MVA by PT1/2 1.23 cm2 with men gradient  12 peak 19 mmHg at HR of 101 bpm. The mitral valve is abnormal. Mild mitral valve regurgitation. Moderate mitral stenosis. Moderate mitral annular calcification.  5. The aortic valve is tricuspid. There is mild calcification of the aortic valve. There is mild thickening of the aortic valve. Aortic valve regurgitation is trivial. Aortic valve sclerosis is present, with no evidence of aortic valve stenosis.  6. The inferior vena cava is normal in size with greater than 50% respiratory variability, suggesting right atrial pressure of 3 mmHg. FINDINGS  Left Ventricle:  Left ventricular ejection fraction, by estimation, is 60 to 65%. The left ventricle has normal function. The left ventricle has no regional wall motion abnormalities. Strain was performed and the global longitudinal strain is indeterminate. The left ventricular internal cavity size was normal in size. There is no left ventricular hypertrophy. Left ventricular diastolic parameters are indeterminate. Right Ventricle: The right ventricular size is normal. No increase in right ventricular wall thickness. Right ventricular systolic function is normal. Left Atrium: Left atrial size was mildly dilated. Right Atrium: Right atrial size was normal in size. Pericardium: There is no evidence of pericardial effusion. Mitral Valve: MVA by PT1/2 1.23 cm2 with men gradient 12 peak 19 mmHg at HR of 101 bpm. The mitral valve is abnormal. There is moderate thickening of the mitral valve leaflet(s). There is moderate calcification of the mitral valve leaflet(s). Moderate mitral annular calcification. Mild mitral valve regurgitation. Moderate mitral valve stenosis. MV peak gradient, 19.1 mmHg. The mean mitral valve gradient is 12.7 mmHg. Tricuspid Valve: The tricuspid valve is normal in structure. Tricuspid valve regurgitation is trivial. No evidence of tricuspid stenosis. Aortic Valve: The aortic valve is tricuspid. There is mild calcification of the aortic valve. There is mild thickening of the aortic valve. Aortic valve regurgitation is trivial. Aortic valve sclerosis is present, with no evidence of aortic valve stenosis. Pulmonic Valve: The pulmonic valve was normal in structure. Pulmonic valve regurgitation is not visualized. No evidence of pulmonic stenosis. Aorta: The aortic root is normal in size and structure. Venous: The inferior vena cava is normal in size with greater than 50% respiratory variability, suggesting right atrial pressure of 3 mmHg. IAS/Shunts: No atrial level shunt detected by color flow Doppler. Additional  Comments: 3D was performed not requiring image post processing on an independent workstation and was indeterminate.  LEFT VENTRICLE PLAX 2D LVIDd:         3.70 cm LVIDs:         2.90 cm LV PW:         0.70 cm LV IVS:        0.80 cm LVOT diam:     1.80 cm LV SV:         55 LV SV Index:   34 LVOT Area:     2.54 cm  RIGHT VENTRICLE RV S prime:     13.80 cm/s TAPSE (M-mode): 1.8 cm LEFT ATRIUM             Index        RIGHT ATRIUM           Index LA diam:        3.80 cm 2.34 cm/m   RA Area:     13.30 cm LA Vol (A2C):   60.3 ml 37.17 ml/m  RA Volume:   28.90 ml  17.81 ml/m LA Vol (A4C):   45.0 ml 27.74 ml/m LA Biplane Vol: 55.6 ml 34.27 ml/m  AORTIC VALVE LVOT Vmax:  115.00 cm/s LVOT Vmean:  76.200 cm/s LVOT VTI:    0.216 m  AORTA Ao Root diam: 3.00 cm Ao Asc diam:  3.20 cm MITRAL VALVE MV Area VTI:  1.15 cm    SHUNTS MV Peak grad: 19.1 mmHg   Systemic VTI:  0.22 m MV Mean grad: 12.7 mmHg   Systemic Diam: 1.80 cm MV Vmax:      2.18 m/s MV Vmean:     172.3 cm/s Janelle Mediate MD Electronically signed by Janelle Mediate MD Signature Date/Time: 04/27/2024/2:17:05 PM    Final     Demaris Fillers, DO  [facility]  If 7PM-7AM, please contact night-coverage [email or web address] Password [email or web address] 05/26/2024, 2:08 PM   LOS: 4 days

## 2024-05-26 NOTE — Consult Note (Signed)
 WOC Nurse Consult Note: Reason for Consult: Stage 2 PI buttocks/sacrum  Wound type: Stage 2 Pressure Injury sacrum/buttocks  Pressure Injury POA: Yes Measurement: see nursing flowsheet  Wound bed: red moist, peeling epithelium with some what appear to be scattered satellite lesions to sacrum and buttocks  Drainage (amount, consistency, odor) see nursing flowsheet  Periwound: erythema with surrounding ?rash r/t possible yeast  Dressing procedure/placement/frequency: Cleanse buttocks/sacrum with NS, apply Xeroform gauze Timm Foot [phone]) to open wound bed daily. Secure with silicone foam or ABD pad and tape whichever is preferred.    Apply Gerhardt's Butt Cream to intact skin of sacrum/buttocks 2 times daily and prn soiling.    POC discussed with bedside nurse WOC team will not follow. Re-consult if further needs arise.   Thank you,    Ronni Colace MSN, RN-BC, [employer] [phone]

## 2024-05-26 NOTE — Plan of Care (Signed)
  Problem: Education: Goal: Knowledge of General Education information will improve Description: Including pain rating scale, medication(s)/side effects and non-pharmacologic comfort measures Outcome: Progressing   Problem: Health Behavior/Discharge Planning: Goal: Ability to manage health-related needs will improve Outcome: Progressing   Problem: Clinical Measurements: Goal: Ability to maintain clinical measurements within normal limits will improve Outcome: Progressing Goal: Will remain free from infection Outcome: Progressing Goal: Diagnostic test results will improve Outcome: Progressing Goal: Respiratory complications will improve Outcome: Progressing   Problem: Activity: Goal: Risk for activity intolerance will decrease Outcome: Progressing   Problem: Nutrition: Goal: Adequate nutrition will be maintained Outcome: Progressing   Problem: Coping: Goal: Level of anxiety will decrease Outcome: Progressing   Problem: Elimination: Goal: Will not experience complications related to bowel motility Outcome: Progressing Goal: Will not experience complications related to urinary retention Outcome: Progressing   Problem: Pain Managment: Goal: General experience of comfort will improve and/or be controlled Outcome: Progressing   Problem: Safety: Goal: Ability to remain free from injury will improve Outcome: Progressing   Problem: Skin Integrity: Goal: Risk for impaired skin integrity will decrease Outcome: Progressing   Problem: Education: Goal: Ability to describe self-care measures that may prevent or decrease complications (Diabetes Survival Skills Education) will improve Outcome: Progressing Goal: Individualized Educational Video(s) Outcome: Progressing   Problem: Coping: Goal: Ability to adjust to condition or change in health will improve Outcome: Progressing   Problem: Fluid Volume: Goal: Ability to maintain a balanced intake and output will  improve Outcome: Progressing   Problem: Health Behavior/Discharge Planning: Goal: Ability to identify and utilize available resources and services will improve Outcome: Progressing Goal: Ability to manage health-related needs will improve Outcome: Progressing   Problem: Metabolic: Goal: Ability to maintain appropriate glucose levels will improve Outcome: Progressing   Problem: Nutritional: Goal: Maintenance of adequate nutrition will improve Outcome: Progressing Goal: Progress toward achieving an optimal weight will improve Outcome: Progressing   Problem: Skin Integrity: Goal: Risk for impaired skin integrity will decrease Outcome: Progressing   Problem: Tissue Perfusion: Goal: Adequacy of tissue perfusion will improve Outcome: Progressing   Problem: Education: Goal: Ability to describe self-care measures that may prevent or decrease complications (Diabetes Survival Skills Education) will improve Outcome: Progressing Goal: Individualized Educational Video(s) Outcome: Progressing   Problem: Cardiac: Goal: Ability to maintain an adequate cardiac output will improve Outcome: Progressing   Problem: Health Behavior/Discharge Planning: Goal: Ability to identify and utilize available resources and services will improve Outcome: Progressing Goal: Ability to manage health-related needs will improve Outcome: Progressing   Problem: Fluid Volume: Goal: Ability to achieve a balanced intake and output will improve Outcome: Progressing   Problem: Metabolic: Goal: Ability to maintain appropriate glucose levels will improve Outcome: Progressing   Problem: Nutritional: Goal: Maintenance of adequate nutrition will improve Outcome: Progressing Goal: Maintenance of adequate weight for body size and type will improve Outcome: Progressing   Problem: Respiratory: Goal: Will regain and/or maintain adequate ventilation Outcome: Progressing   Problem: Urinary Elimination: Goal:  Ability to achieve and maintain adequate renal perfusion and functioning will improve Outcome: Progressing   Problem: Clinical Measurements: Goal: Cardiovascular complication will be avoided Outcome: Not Progressing

## 2024-05-27 DIAGNOSIS — J9601 Acute respiratory failure with hypoxia: Secondary | ICD-10-CM | POA: Diagnosis not present

## 2024-05-27 DIAGNOSIS — A4181 Sepsis due to Enterococcus: Secondary | ICD-10-CM | POA: Diagnosis not present

## 2024-05-27 DIAGNOSIS — A419 Sepsis, unspecified organism: Secondary | ICD-10-CM | POA: Diagnosis not present

## 2024-05-27 DIAGNOSIS — J690 Pneumonitis due to inhalation of food and vomit: Secondary | ICD-10-CM | POA: Diagnosis not present

## 2024-05-27 LAB — BASIC METABOLIC PANEL WITH GFR
Anion gap: 7 (ref 5–15)
BUN: 18 mg/dL (ref 8–23)
CO2: 27 mmol/L (ref 22–32)
Calcium: 7.7 mg/dL — ABNORMAL LOW (ref 8.9–10.3)
Chloride: 105 mmol/L (ref 98–111)
Creatinine, Ser: 0.57 mg/dL (ref 0.44–1.00)
GFR, Estimated: >60 mL/min (ref >60)
Glucose, Bld: 194 mg/dL — ABNORMAL HIGH (ref 70–99)
Potassium: 3.5 mmol/L (ref 3.5–5.1)
Sodium: 139 mmol/L (ref 135–145)

## 2024-05-27 LAB — GLUCOSE, CAPILLARY
Glucose-Capillary: 184 mg/dL — ABNORMAL HIGH (ref 70–99)
Glucose-Capillary: 87 mg/dL (ref 70–99)
Glucose-Capillary: 285 mg/dL — ABNORMAL HIGH (ref 70–99)
Glucose-Capillary: 311 mg/dL — ABNORMAL HIGH (ref 70–99)
Glucose-Capillary: 347 mg/dL — ABNORMAL HIGH (ref 70–99)

## 2024-05-27 LAB — CBC
HCT: 31 % — ABNORMAL LOW (ref 36.0–46.0)
HCT: 32.2 % — ABNORMAL LOW (ref 36.0–46.0)
Hemoglobin: 10.4 g/dL — ABNORMAL LOW (ref 12.0–15.0)
Hemoglobin: 10.5 g/dL — ABNORMAL LOW (ref 12.0–15.0)
MCH: 30.1 pg (ref 26.0–34.0)
MCH: 30.1 pg (ref 26.0–34.0)
MCHC: 33.5 g/dL (ref 30.0–36.0)
MCHC: 32.6 g/dL (ref 30.0–36.0)
MCV: 89.9 fL (ref 80.0–100.0)
MCV: 92.3 fL (ref 80.0–100.0)
Platelets: 251 10*3/uL (ref 150–400)
Platelets: 245 10*3/uL (ref 150–400)
RBC: 3.45 MIL/uL — ABNORMAL LOW (ref 3.87–5.11)
RBC: 3.49 MIL/uL — ABNORMAL LOW (ref 3.87–5.11)
RDW: 17.3 % — ABNORMAL HIGH (ref 11.5–15.5)
RDW: 17.4 % — ABNORMAL HIGH (ref 11.5–15.5)
WBC: 12.2 10*3/uL — ABNORMAL HIGH (ref 4.0–10.5)
WBC: 12.4 10*3/uL — ABNORMAL HIGH (ref 4.0–10.5)
nRBC: 0 % (ref 0.0–0.2)
nRBC: 0 % (ref 0.0–0.2)

## 2024-05-27 LAB — CULTURE, BLOOD (ROUTINE X 2)
Culture: NO GROWTH
Culture: NO GROWTH
Special Requests: ADEQUATE

## 2024-05-27 LAB — PHOSPHORUS: Phosphorus: 2.9 mg/dL (ref 2.5–4.6)

## 2024-05-27 LAB — MAGNESIUM: Magnesium: 2.1 mg/dL (ref 1.7–2.4)

## 2024-05-27 MED ORDER — ALBUTEROL SULFATE (2.5 MG/3ML) 0.083% IN NEBU: 2.5000 mg | INHALATION_SOLUTION | Freq: Three times a day (TID) | RESPIRATORY_TRACT | Status: DC | PRN

## 2024-05-27 MED ORDER — MIDODRINE HCL 5 MG PO TABS
15.0000 mg | ORAL_TABLET | Freq: Three times a day (TID) | ORAL | Status: DC
  Administered 2024-05-27 – 2024-05-28 (×3): 15 mg via ORAL
  Filled 2024-05-27 (×3): qty 3

## 2024-05-27 MED ORDER — UMECLIDINIUM BROMIDE 62.5 MCG/ACT IN AEPB: 1.0000 | INHALATION_SPRAY | Freq: Every day | RESPIRATORY_TRACT | 1 refills | Status: DC

## 2024-05-27 MED ORDER — PREDNISONE 20 MG PO TABS: 30.0000 mg | ORAL_TABLET | Freq: Every day | ORAL | Status: DC

## 2024-05-27 MED ORDER — PREDNISONE 20 MG PO TABS
20.0000 mg | ORAL_TABLET | Freq: Every day | ORAL | Status: DC
  Administered 2024-05-28: 20 mg via ORAL
  Filled 2024-05-27: qty 1

## 2024-05-27 NOTE — Progress Notes (Signed)
 Speech Language Pathology Treatment: Dysphagia  Patient Details Name: [name] White MRN: [id number] DOB: Aug 01, 1941 Today's Date: 05/27/2024 Time: 1478-2956 SLP Time Calculation (min) (ACUTE ONLY): 19 min  Assessment / Plan / Recommendation Clinical Impression  Pt seen for ongoing dysphagia therapy to assess diet tolerance/safety and reinforce aspiration precautions. Pt sitting upright in her chair upon SLP arrival. She denied pain. Pt had eaten 100% of her lunch and reported no issues. Pt agreeable to PO trials of thin liquids via straw, puree applesauce via spoon, and regular texture graham crackers. Wet vocal quality and throat clear noted with thin liquids, consistent with previous tx sessions. No overt s/sx of aspiration observed with puree or regular textures. She denied globus sensation. Pt required min verbal cues to use double swallow strategy. She verbalized understanding of the strategy with teach back method. She did not recall previous information re: Zenker's Diverticulum; SLP provided re-education on how this can impact her swallowing. Pt verbalized understanding. Pt left in chair with call bell in reach. Rec continue D3/thin textures with aspiration precautions (small bites/sips, upright for PO intake, and double swallows).  ST to sign off at this time as pt is able to verbalize strategies and demonstrate them.     HPI HPI: 83 year old female with a history of diabetes mellitus type 2, stroke, hypertension, COPD, hypothyroidism, hyperlipidemia, anxiety, PSVT presenting with coughing, chest congestion, shortness of breath.  The patient denies any fevers, chills, chest pain, nausea, vomiting or diarrhea.  She denies any hemoptysis.  She was recently admitted to the hospital from 05/10/2024 to 05/15/2024.  During that hospitalization, the patient was treated for COPD exacerbation and what was felt to be atypical pneumonia.  She was discharged with a prednisone  taper.  In addition, the patient  was also admitted from 04/24/2024 to 05/01/2024 with septic shock secondary pneumonia.  She has had increasing generalized weakness since discharge from the hospital.  There is no hematochezia or melena.  She does complain of some dysuria for the past week.  In the ED, the patient was afebrile with soft blood pressures in the low 90s and upper 80s.  WBC 17.1, hemoglobin 12.5, platelet 236.  Sodium 130, potassium 4.2, bicarbonate 26, serum creatinine 0.82.  LFTs were unremarkable.  Chest x-ray showed chronic interstitial markings.  Lactic acid 2.8.  The patient was started on linezolid  and cefepime . Pt had MBSS 04/29/2024 with recommendation for D3/thin and evidence of Zenker's diverticulum. BSE requested.      SLP Plan  Discharge ST treatment due to pt able to verbalize and demonstrate safe swallow strategies and is set to d/c today/tomorrow      Recommendations for follow up therapy are one component of a multi-disciplinary discharge planning process, led by the attending physician.  Recommendations may be updated based on patient status, additional functional criteria and insurance authorization.    Recommendations  Diet recommendations: Thin liquid Liquids provided via: Straw;Cup Medication Administration: Whole meds with liquid Supervision: Patient able to self feed;Intermittent supervision to cue for compensatory strategies Compensations: Slow rate;Small sips/bites;Multiple dry swallows after each bite/sip Postural Changes and/or Swallow Maneuvers: Seated upright 90 degrees    Caretha Chapel, MA CCC-SLP Speech-Language Pathologist 05/27/2024, 2:59 PM

## 2024-05-27 NOTE — Plan of Care (Signed)

## 2024-05-27 NOTE — Progress Notes (Signed)
 PROGRESS NOTE  [name] White ZOX:[id number] DOB: November 04, 1941 DOA: 05/22/2024 PCP: Omie Bickers, MD  Brief History:  83 year old female with a history of diabetes mellitus type 2, stroke, hypertension, COPD, hypothyroidism, hyperlipidemia, anxiety, PSVT presenting with coughing, chest congestion, shortness of breath.  The patient denies any fevers, chills, chest pain, nausea, vomiting or diarrhea.  She denies any hemoptysis.  She was recently admitted to the hospital from 05/10/2024 to 05/15/2024.  During that hospitalization, the patient was treated for COPD exacerbation and what was felt to be atypical pneumonia.  She was discharged with a prednisone  taper.  In addition, the patient was also admitted from 04/24/2024 to 05/01/2024 with septic shock secondary pneumonia. She has had increasing generalized weakness since discharge from the hospital.  There is no hematochezia or melena.  She does complain of some dysuria for the past week. In the ED, the patient was afebrile with soft blood pressures in the low 90s and upper 80s.  WBC 17.1, hemoglobin 12.5, platelet 236.  Sodium 130, potassium 4.2, bicarbonate 26, serum creatinine 0.82.  LFTs were unremarkable.  Chest x-ray showed chronic interstitial markings.  Lactic acid 2.8.  The patient was started on linezolid  and cefepime .   Assessment/Plan: Severe sepsis - Presented with leukocytosis, lactic acid 2.8, low BPs - Secondary to pneumonia and UTI - 05/10/24 UA >50 WBC - 05/10/24 urine culture = VRE - Continue linezolid  - continue cefepime  - Continue azithromycin  - remains on levophed  1 mg/kg/min>>not required last 24 hours - increased midodrine  to 10 mg tid - continue florinef 0.1 mg   VRE UTI - Continue linezolid    Acute respiratory failure with hypoxia - Secondary to COPD exacerbation, pneumonia - Presented with oxygen saturation 88% on room air - Stable on 2 L>>weaned to RA - Wean oxygen as tolerated - given lasix  IV x 1>>start  lasix  40 mg po daily>>hold now since BPs remain labile - COVID/RSV/Flu--neg - viral respiratory panel--neg - CTA chest--neg PE;  scattered GGO bilateral UL.  Bibasilar patchy consolidation   COPD exacerbation - Continue Solu-Medrol >>wean to prednisone  6/1 - Continue DuoNebs - Continue Brovana  - Continue Pulmicort  - continue Yupelri    Hyperosmolar nonketotic state -due to steroids -serum glucose up to 687 -5/29 started insulin  drip>>transition to [facility] insulin  5/30 -start semglee  10 units and Novolog  q 4 hour -add novolog  4 units with meals -advised family not to bring outside food   Hyperlipidemia - Continue statin   Controlled diabetes mellitus type 2 with hyperglycemia - 04/25/2024 hemoglobin A1c 7.0 - Holding metformin  - NovoLog  sliding scale   Chronic pain - Continue Lyrica    History of stroke - Continue aspirin , Plavix , statin   Depression/anxiety - Hold Trintellix  and venlafaxine  temporarily while getting linezolid    Hypothyroidism - Continue Synthroid    Aspiration pneumonitis -continue abx -speech therapy eval appreciated>>dys 3 with thin   Hypomagnesemia/hypophosphatemia -repleted -add kphos               Family Communication:   daughter at bedside 6/1   Consultants:  none   Code Status:  DNR   DVT Prophylaxis:  [place] Lovenox      Procedures: As Listed in Progress Note Above   Antibiotics: Zyvox  5/28>> Cefepime  5/28>> Azithro 5/28>>            Subjective: Patient denies fevers, chills, headache, chest pain, dyspnea, nausea, vomiting, diarrhea, abdominal pain, dysuria, hematuria, hematochezia, and melena.   Objective: Vitals:   05/27/24 0630 05/27/24  0727 05/27/24 0747 05/27/24 0800  BP: (!) 97/47   138/66  Pulse: 64   73  Resp: 16   (!) 22  Temp:   97.6 F (36.4 C)   TempSrc:   Oral   SpO2: 100% 99%  92%  Weight:      Height:        Intake/Output Summary (Last 24 hours) at 05/27/2024 0828 Last data filed at 05/27/2024  0759 Gross per 24 hour  Intake 1505.62 ml  Output --  Net 1505.62 ml   Weight change:  Exam:  General:  Pt is alert, follows commands appropriately, not in acute distress HEENT: No icterus, No thrush, No neck mass, [place]/AT Cardiovascular: RRR, S1/S2, no rubs, no gallops Respiratory: bibasilar rales.  No wheeze Abdomen: Soft/+BS, non tender, non distended, no guarding Extremities: No edema, No lymphangitis, No petechiae, No rashes, no synovitis   Data Reviewed: I have personally reviewed following labs and imaging studies Basic Metabolic Panel: Recent Labs  Lab 05/23/24 0411 05/23/24 1649 05/24/24 0554 05/24/24 0835 05/25/24 0300 05/26/24 0550 05/27/24 0415  NA 132*   < > 135 136 139 138 139  K 4.2   < > 3.7 3.6 3.9 3.6 3.5  CL 96*   < > 100 100 103 102 105  CO2 24   < > 25 27 26 27 27   GLUCOSE 469*   < > 216* 98 208* 117* 194*  BUN 20   < > 25* 23 21 17 18   CREATININE 0.64   < > 0.71 0.64 0.61 0.53 0.57  CALCIUM  7.9*   < > 8.2* 8.1* 8.0* 7.8* 7.7*  MG 1.6*  --  1.9  --  1.8 1.7 2.1  PHOS  --   --   --   --  2.4* 2.6 2.9   < > = values in this interval not displayed.   Liver Function Tests: Recent Labs  Lab 05/22/24 1134  AST 16  ALT 33  ALKPHOS 51  BILITOT 1.0  PROT 6.2*  ALBUMIN 2.9*   No results for input(s): "LIPASE", "AMYLASE" in the last 168 hours. No results for input(s): "AMMONIA" in the last 168 hours. Coagulation Profile: No results for input(s): "INR", "PROTIME" in the last 168 hours. CBC: Recent Labs  Lab 05/22/24 1134 05/23/24 0411 05/24/24 0428 05/27/24 0415  WBC 17.1* 16.9* 28.1* 12.2*  NEUTROABS 13.5*  --   --   --   HGB 12.5 13.4 13.5 10.4*  HCT 37.6 39.6 41.9 31.0*  MCV 90.4 88.8 91.7 89.9  PLT 236 271 278 251   Cardiac Enzymes: No results for input(s): "CKTOTAL", "CKMB", "CKMBINDEX", "TROPONINI" in the last 168 hours. BNP: Invalid input(s): "POCBNP" CBG: Recent Labs  Lab 05/26/24 1616 05/26/24 2100 05/26/24 2348  05/27/24 0409 05/27/24 0726  GLUCAP 399* 368* 391* 184* 87   HbA1C: No results for input(s): "HGBA1C" in the last 72 hours. Urine analysis:    Component Value Date/Time   COLORURINE YELLOW 05/22/2024 1428   APPEARANCEUR HAZY (A) 05/22/2024 1428   LABSPEC 1.011 05/22/2024 1428   PHURINE 7.0 05/22/2024 1428   GLUCOSEU >=500 (A) 05/22/2024 1428   GLUCOSEU >=1000 (A) 06/25/2020 1441   HGBUR NEGATIVE 05/22/2024 1428   BILIRUBINUR NEGATIVE 05/22/2024 1428   BILIRUBINUR neg 03/19/2013 1512   KETONESUR NEGATIVE 05/22/2024 1428   PROTEINUR NEGATIVE 05/22/2024 1428   UROBILINOGEN 0.2 06/25/2020 1441   NITRITE NEGATIVE 05/22/2024 1428   LEUKOCYTESUR LARGE (A) 05/22/2024 1428   Sepsis Labs: @LABRCNTIP (procalcitonin:4,lacticidven:4) )  Recent Results (from the past 240 hours)  Culture, blood (routine x 2)     Status: None   Collection Time: 05/22/24 11:31 AM   Specimen: BLOOD  Result Value Ref Range Status   Specimen Description BLOOD BLOOD LEFT ARM  Final   Special Requests   Final    BOTTLES DRAWN AEROBIC AND ANAEROBIC Blood Culture adequate volume   Culture   Final    NO GROWTH 5 DAYS Performed at [facility], [address] [place], [place] [address]    Report Status 05/27/2024 FINAL  Final  Culture, blood (routine x 2)     Status: None   Collection Time: 05/22/24 11:34 AM   Specimen: BLOOD  Result Value Ref Range Status   Specimen Description BLOOD BLOOD RIGHT HAND  Final   Special Requests   Final    BOTTLES DRAWN AEROBIC AND ANAEROBIC Blood Culture results may not be optimal due to an inadequate volume of blood received in culture bottles   Culture   Final    NO GROWTH 5 DAYS Performed at [facility], [address], [place], [place] [address]    Report Status 05/27/2024 FINAL  Final  Urine Culture     Status: Abnormal   Collection Time: 05/22/24  2:28 PM   Specimen: Urine, Random  Result Value Ref Range Status   Specimen Description   Final    URINE,  RANDOM Performed at [facility], [address], [place], [place] [address]    Special Requests   Final    NONE Reflexed from [id number] Performed at [facility], [address] [place], [place] [address]    Culture (A)  Final    >=100,000 COLONIES/mL ENTEROCOCCUS FAECALIS VANCOMYCIN  RESISTANT ENTEROCOCCUS ISOLATED    Report Status 05/24/2024 FINAL  Final   Organism ID, Bacteria ENTEROCOCCUS FAECALIS (A)  Final      Susceptibility   Enterococcus faecalis - MIC*    AMPICILLIN <=2 SENSITIVE Sensitive     NITROFURANTOIN <=16 SENSITIVE Sensitive     VANCOMYCIN  >=32 RESISTANT Resistant     LINEZOLID  2 SENSITIVE Sensitive     * >=100,000 COLONIES/mL ENTEROCOCCUS FAECALIS  MRSA Next Gen by PCR, Nasal     Status: None   Collection Time: 05/22/24  3:37 PM   Specimen: Nasal Mucosa; Nasal Swab  Result Value Ref Range Status   MRSA by PCR Next Gen NOT DETECTED NOT DETECTED Final    Comment: (NOTE) The GeneXpert MRSA Assay (FDA approved for NASAL specimens only), is one component of a comprehensive MRSA colonization surveillance program. It is not intended to diagnose MRSA infection nor to guide or monitor treatment for MRSA infections. Test performance is not FDA approved in patients less than 18 years old. Performed at [facility], [address] [place], [place] [address]   Resp panel by RT-PCR (RSV, Flu A&B, Covid) Nasal Mucosa     Status: None   Collection Time: 05/22/24  3:37 PM   Specimen: Nasal Mucosa; Nasal Swab  Result Value Ref Range Status   SARS Coronavirus 2 by RT PCR NEGATIVE NEGATIVE Final    Comment: (NOTE) SARS-CoV-2 target nucleic acids are NOT DETECTED.  The SARS-CoV-2 RNA is generally detectable in upper respiratory specimens during the acute phase of infection. The lowest concentration of SARS-CoV-2 viral copies this assay can detect is 138 copies/mL. A negative result does not preclude SARS-Cov-2 infection and should not be used as the sole basis for treatment  or other patient management decisions.  A negative result may occur with  improper specimen collection/handling, submission of specimen other than nasopharyngeal swab, presence of viral mutation(s) within the areas targeted by this assay, and inadequate number of viral copies(<138 copies/mL). A negative result must be combined with clinical observations, patient history, and epidemiological information. The expected result is Negative.  Fact Sheet for Patients:  BloggerCourse.com  Fact Sheet for Healthcare Providers:  SeriousBroker.it  This test is no t yet approved or cleared by the [place]  FDA and  has been authorized for detection and/or diagnosis of SARS-CoV-2 by FDA under an Emergency Use Authorization (EUA). This EUA will remain  in effect (meaning this test can be used) for the duration of the COVID-19 declaration under Section 564(b)(1) of the Act, 21 U.S.C.section 360bbb-3(b)(1), unless the authorization is terminated  or revoked sooner.       Influenza A by PCR NEGATIVE NEGATIVE Final   Influenza B by PCR NEGATIVE NEGATIVE Final    Comment: (NOTE) The Xpert Xpress SARS-CoV-2/FLU/RSV plus assay is intended as an aid in the diagnosis of influenza from Nasopharyngeal swab specimens and should not be used as a sole basis for treatment. Nasal washings and aspirates are unacceptable for Xpert Xpress SARS-CoV-2/FLU/RSV testing.  Fact Sheet for Patients: BloggerCourse.com  Fact Sheet for Healthcare Providers: SeriousBroker.it  This test is not yet approved or cleared by the [place]  FDA and has been authorized for detection and/or diagnosis of SARS-CoV-2 by FDA under an Emergency Use Authorization (EUA). This EUA will remain in effect (meaning this test can be used) for the duration of the COVID-19 declaration under Section 564(b)(1) of the Act, 21 U.S.C. section  360bbb-3(b)(1), unless the authorization is terminated or revoked.     Resp Syncytial Virus by PCR NEGATIVE NEGATIVE Final    Comment: (NOTE) Fact Sheet for Patients: BloggerCourse.com  Fact Sheet for Healthcare Providers: SeriousBroker.it  This test is not yet approved or cleared by the [place]  FDA and has been authorized for detection and/or diagnosis of SARS-CoV-2 by FDA under an Emergency Use Authorization (EUA). This EUA will remain in effect (meaning this test can be used) for the duration of the COVID-19 declaration under Section 564(b)(1) of the Act, 21 U.S.C. section 360bbb-3(b)(1), unless the authorization is terminated or revoked.  Performed at [facility], [address], [place], [place] [address]   Respiratory (~20 pathogens) panel by PCR     Status: None   Collection Time: 05/23/24 10:30 AM   Specimen: Nasopharyngeal Swab; Respiratory  Result Value Ref Range Status   Adenovirus NOT DETECTED NOT DETECTED Final   Coronavirus 229E NOT DETECTED NOT DETECTED Final    Comment: (NOTE) The Coronavirus on the Respiratory Panel, DOES NOT test for the novel  Coronavirus (2019 nCoV)    Coronavirus HKU1 NOT DETECTED NOT DETECTED Final   Coronavirus NL63 NOT DETECTED NOT DETECTED Final   Coronavirus OC43 NOT DETECTED NOT DETECTED Final   Metapneumovirus NOT DETECTED NOT DETECTED Final   Rhinovirus / Enterovirus NOT DETECTED NOT DETECTED Final   Influenza A NOT DETECTED NOT DETECTED Final   Influenza B NOT DETECTED NOT DETECTED Final   Parainfluenza Virus 1 NOT DETECTED NOT DETECTED Final   Parainfluenza Virus 2 NOT DETECTED NOT DETECTED Final   Parainfluenza Virus 3 NOT DETECTED NOT DETECTED Final   Parainfluenza Virus 4 NOT DETECTED NOT DETECTED Final   Respiratory Syncytial Virus NOT DETECTED NOT DETECTED Final   Bordetella pertussis NOT DETECTED NOT DETECTED Final   Bordetella Parapertussis NOT  DETECTED NOT  DETECTED Final   Chlamydophila pneumoniae NOT DETECTED NOT DETECTED Final   Mycoplasma pneumoniae NOT DETECTED NOT DETECTED Final    Comment: Performed at [facility], [address] [place], [place] [address]     Scheduled Meds:  arformoterol   15 mcg Nebulization BID   aspirin  EC  81 mg Oral Daily   atorvastatin   40 mg Oral Daily   azithromycin   500 mg Oral Daily   budesonide  (PULMICORT ) nebulizer solution  0.5 mg Nebulization BID   Chlorhexidine  Gluconate Cloth  6 each Topical Q0600   clopidogrel   75 mg Oral Daily   cycloSPORINE   1 drop Both Eyes BID   enoxaparin  (LOVENOX ) injection  40 mg Subcutaneous Q24H   feeding supplement  1 Container Oral TID BM   fludrocortisone  0.1 mg Oral Daily   furosemide   40 mg Oral Daily   Gerhardt's butt cream   Topical BID   hydrocortisone  25 mg Rectal BID   insulin  aspart  0-9 Units Subcutaneous Q4H   insulin  aspart  6 Units Subcutaneous TID WC   insulin  glargine-yfgn  10 Units Subcutaneous Daily   levothyroxine   50 mcg Oral Daily   midodrine   15 mg Oral TID WC   mirabegron  ER  25 mg Oral Daily   pantoprazole   40 mg Oral Daily   phosphorus  500 mg Oral BID   polyethylene glycol  17 g Oral BID   predniSONE   40 mg Oral Q breakfast   pregabalin   150 mg Oral BID   revefenacin   175 mcg Nebulization Daily   senna  2 tablet Oral QHS   Continuous Infusions:  ceFEPIme  (MAXIPIME ) 2 g in sodium chloride  0.9 % 100 mL IVPB Stopped (05/26/24 2223)   linezolid  (ZYVOX ) IV Stopped (05/26/24 2256)   norepinephrine  (LEVOPHED ) Adult infusion Stopped (05/26/24 2202)    Procedures/Studies: CT Angio Chest Pulmonary Embolism (PE) W or WO Contrast Result Date: 05/22/2024 CLINICAL DATA:  Pulmonary embolism suspected, low to intermediate probability, negative D-dimer. Shortness of breath and respiratory failure. EXAM: CT ANGIOGRAPHY CHEST WITH CONTRAST TECHNIQUE: Multidetector CT imaging of the chest was performed using the standard protocol during bolus  administration of intravenous contrast. Multiplanar CT image reconstructions and MIPs were obtained to evaluate the vascular anatomy. RADIATION DOSE REDUCTION: This exam was performed according to the departmental dose-optimization program which includes automated exposure control, adjustment of the mA and/or kV according to patient size and/or use of iterative reconstruction technique. CONTRAST:  75mL OMNIPAQUE  IOHEXOL  350 MG/ML SOLN COMPARISON:  04/24/2024. FINDINGS: Cardiovascular: The heart is borderline enlarged and there is no pericardial effusion. There are calcifications in the mitral valve annulus. There is mild atherosclerotic calcification of the aorta without evidence of aneurysm. The pulmonary trunk is normal in caliber. There is no evidence of pulmonary embolism. Mediastinum/Nodes: No mediastinal, hilar, or axillary lymphadenopathy. The trachea and esophagus are within normal limits. Lungs/Pleura: There are small bilateral pleural effusions, greater on the left than on the right. Patchy atelectasis and consolidation is noted in the lower lobes bilaterally. A few scattered ground-glass opacities are noted in the upper lobes bilaterally. No pneumothorax is seen. There is a 3 mm nodule in the posterior segment of the left upper lobe, axial image 64. Upper Abdomen: The gallbladder is surgically absent. There is stable nodularity of the left adrenal gland, previously characterized as adenoma is. No acute abnormality is seen. Musculoskeletal: Degenerative changes are present in the thoracic spine. No acute osseous abnormality is seen.  Review of the MIP images confirms the above findings. IMPRESSION: 1. No evidence of pulmonary embolism. 2. Scattered ground-glass opacities in the upper lobes bilaterally and patchy consolidation at the lung bases, suspicious for multifocal pneumonia, slightly increased at the left lung base. 3. Small bilateral pleural effusions. 4. 3 mm left upper lobe pulmonary nodule. No  follow-up needed if patient is low-risk.This recommendation follows the consensus statement: Guidelines for Management of Incidental Pulmonary Nodules Detected on CT Images: From the Fleischner Society 2017; Radiology 2017; 284:228-243. 5. Aortic atherosclerosis. Electronically Signed   By: Wyvonnia Heimlich M.D.   On: 05/22/2024 20:29   DG Chest Portable 1 View Result Date: 05/22/2024 CLINICAL DATA:  cough EXAM: PORTABLE CHEST - 1 VIEW COMPARISON:  May 10, 2024 FINDINGS: Low lung volumes. No focal airspace consolidation, pleural effusion, or pneumothorax. No cardiomegaly. Tortuous aorta with aortic atherosclerosis. No acute fracture or destructive lesion. Multilevel thoracic osteophytosis. Osteopenia. IMPRESSION: No acute cardiopulmonary abnormality. Electronically Signed   By: Rance Burrows M.D.   On: 05/22/2024 13:50   DG Chest Port 1 View Result Date: 05/10/2024 CLINICAL DATA:  Questionable sepsis EXAM: PORTABLE CHEST 1 VIEW COMPARISON:  Chest x-ray 04/26/2024.  Chest CT 04/24/2004. FINDINGS: The heart size and mediastinal contours are within normal limits. Both lungs are clear. The visualized skeletal structures are unremarkable. IMPRESSION: No active disease. Electronically Signed   By: Tyron Gallon M.D.   On: 05/10/2024 15:17   DG Swallowing Func-Speech Pathology Result Date: 04/29/2024 Table formatting from the original result was not included. Modified Barium Swallow Study Patient Details Name: ALLISYN KUNZ MRN: [id number] Date of Birth: [birth year] Today's Date: 04/29/2024 HPI/PMH: HPI: SEERAT PEADEN is a 83 y.o. female with medical history significant for diabetes, hypertension, stroke, pituitary adenoma, PSVT, anxiety and depression.  Patient was brought to the ED from her group facility with reports of confusion that started today, also with cough, fever.   At the time of my evaluation, patient is lethargic, responds to direction but unable to answer questions or give history. Recent hospitalization  3/17 to 3/25 for acute/early subacute stroke with concomitant acute metabolic encephalopathy.  Was discharged to SNF.     Patient has been admitted with septic shock secondary to multifocal pneumonia along with associated acute hypoxemic respiratory failure and acute metabolic encephalopathy. BSE requested. Clinical Impression: Patient presents with oropharyngeal swallowing to be essentially [facility]. Note one episode of flash frank penetration of thin liquids that was cleared during the swallow and occasional premature spillage of thin liquids penetrated before the swallow but was cleared during the swallow every time. Swallow is often triggered at the posterior angle of the ramus but sometimes is delayed until bolus fills the pyriform sinuses. Pharyngeal stripping wave is slightly diminished resulting in occasional trace pharyngeal residue. Note suspected small Zenker's Diverticulum (ZD) - no radiologist present to confirm. Residue in ZD with retrograde movement back up to the pyriforms. Repeat dry swallow clears retrograde residue from the pyriforms, however note it again collects in the ZD. No retrograde residue was penetrated or aspirated. Esophageal sweep was unremarkable. Recommend upgrade Pt's diet to D3/mech soft and continue with thin liquids. Recommend Pt utilize repeat swallow with every sip/bite to ensure trace/min retrograde residue is cleared from pharynx. Reviewed findings with patient and RN. There are no further ST needs noted at this time, our service will sign off. Factors that may increase risk of adverse event in presence of aspiration Roderick Civatte & Jessy Morocco 2021): No data recorded Recommendations/Plan: Swallowing  Evaluation Recommendations Swallowing Evaluation Recommendations Recommendations: PO diet PO Diet Recommendation: Dysphagia 3 (Mechanical soft); Thin liquids (Level 0) Liquid Administration via: Cup; Straw Medication Administration: Whole meds with liquid Supervision: Patient able to self-feed  Swallowing strategies  : Slow rate; Small bites/sips; Multiple dry swallows after each bite/sip Postural changes: Position pt fully upright for meals Oral care recommendations: Oral care BID (2x/day) Treatment Plan Treatment Plan Treatment recommendations: Therapy as outlined in treatment plan below Follow-up recommendations: No SLP follow up Interventions: Aspiration precaution training Recommendations Recommendations for follow up therapy are one component of a multi-disciplinary discharge planning process, led by the attending physician.  Recommendations may be updated based on patient status, additional functional criteria and insurance authorization. Assessment: Orofacial Exam: Orofacial Exam Oral Cavity: Oral Hygiene: WFL Oral Cavity - Dentition: Adequate natural dentition; Missing dentition Orofacial Anatomy: WFL Oral Motor/Sensory Function: WFL Anatomy: Anatomy: WFL Boluses Administered: Boluses Administered Boluses Administered: Thin liquids (Level 0); Mildly thick liquids (Level 2, nectar thick); Moderately thick liquids (Level 3, honey thick); Puree; Solid  Oral Impairment Domain: Oral Impairment Domain Lip Closure: No labial escape Tongue control during bolus hold: Not tested Bolus preparation/mastication: Timely and efficient chewing and mashing Bolus transport/lingual motion: Brisk tongue motion Oral residue: Complete oral clearance Initiation of pharyngeal swallow : Posterior angle of the ramus; Pyriform sinuses  Pharyngeal Impairment Domain: Pharyngeal Impairment Domain Soft palate elevation: No bolus between soft palate (SP)/pharyngeal wall (PW) Laryngeal elevation: Complete superior movement of thyroid  cartilage with complete approximation of arytenoids to epiglottic petiole Anterior hyoid excursion: Complete anterior movement Epiglottic movement: Complete inversion Laryngeal vestibule closure: Complete, no air/contrast in laryngeal vestibule; Incomplete, narrow column air/contrast in laryngeal  vestibule Pharyngeal stripping wave : Present - complete Pharyngeal contraction (A/P view only): N/A Pharyngoesophageal segment opening: Complete distension and complete duration, no obstruction of flow Tongue base retraction: No contrast between tongue base and posterior pharyngeal wall (PPW) Pharyngeal residue: Trace residue within or on pharyngeal structures Location of pharyngeal residue: Tongue base; Valleculae  Esophageal Impairment Domain: Esophageal Impairment Domain Esophageal clearance upright position: Esophageal retention with retrograde flow below pharyngoesophageal segment (PES) (small Zenker's Diverticulum) Pill: Pill Consistency administered: Thin liquids (Level 0) Thin liquids (Level 0): [facility] Penetration/Aspiration Scale Score: Penetration/Aspiration Scale Score 1.  Material does not enter airway: Thin liquids (Level 0); Mildly thick liquids (Level 2, nectar thick); Moderately thick liquids (Level 3, honey thick); Solid; Puree; Pill 2.  Material enters airway, remains ABOVE vocal cords then ejected out: Thin liquids (Level 0) Compensatory Strategies: Compensatory Strategies Compensatory strategies: Yes Multiple swallows: Effective   General Information: Caregiver present: No  Diet Prior to this Study: Dysphagia 2 (finely chopped); Thin liquids (Level 0)   Temperature : Normal   Respiratory Status: WFL   Supplemental O2: Nasal cannula   History of Recent Intubation: No  Behavior/Cognition: Alert; Cooperative; Pleasant mood; Confused Self-Feeding Abilities: Able to self-feed Baseline vocal quality/speech: Normal Volitional Cough: Able to elicit Volitional Swallow: Able to elicit No data recorded Goal Planning: No data recorded No data recorded No data recorded No data recorded No data recorded Pain: Pain Assessment Pain Assessment: No/denies pain End of Session: Start Time:SLP Start Time (ACUTE ONLY): 1121 Stop Time: SLP Stop Time (ACUTE ONLY): 1154 Time Calculation:SLP Time Calculation (min) (ACUTE  ONLY): 33 min Charges: SLP Evaluations $ SLP Speech Visit: 1 Visit SLP Evaluations $MBS Swallow: 1 Procedure SLP visit diagnosis: SLP Visit Diagnosis: Dysphagia, unspecified (R13.10) Past Medical History: Past Medical History: Diagnosis Date  Anxiety  Asthma   ASYMPTOMATIC POSTMENOPAUSAL STATUS 02/09/2009  Cataract   Constipation 03/19/2013  Depression   DIABETES MELLITUS, TYPE II 09/14/2007  HYPERCHOLESTEROLEMIA 02/09/2009  HYPERTENSION 02/09/2009  HYPOTHYROIDISM 09/14/2007  MIGRAINE HEADACHE 09/14/2007  Neuropathy   OSTEOPOROSIS 02/09/2009  PANCREATITIS 09/14/2007  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  PONV (postoperative nausea and vomiting)   Rectocele 03/19/2013  Shortness of breath   SUPERFICIAL PHLEBITIS 09/14/2007  Varicose veins  Past Surgical History: Past Surgical History: Procedure Laterality Date  ABDOMINAL HYSTERECTOMY    ANTERIOR AND POSTERIOR REPAIR N/A 04/02/2013  Procedure: ANTERIOR (CYSTOCELE) AND POSTERIOR REPAIR (RECTOCELE);  Surgeon: Albino Hum, MD;  Location: [facility];  Service: Gynecology;  Laterality: N/A;  APPENDECTOMY    BRAIN SURGERY    CATARACT EXTRACTION W/PHACO  12/05/2011  Procedure: CATARACT EXTRACTION PHACO AND INTRAOCULAR LENS PLACEMENT (IOC);  Surgeon: Anner Kill;  Location: [facility];  Service: Ophthalmology;  Laterality: Left;  CDE:9.65  CHOLECYSTECTOMY    CRANIOTOMY N/A 06/04/2020  Procedure: Endoscopic Transphenoidal Resection of Recurrent PituitaryTumor;  Surgeon: Elna Haggis, MD;  Location: [facility];  Service: Neurosurgery;  Laterality: N/A;  CYSTOSCOPY    ENDOVENOUS ABLATION SAPHENOUS VEIN W/ LASER  11-03-2011   right greater saphenous vein  left leg done 10-2011  EYE SURGERY  98  right cataract extraction 98  LAPAROSCOPIC NISSEN FUNDOPLICATION    NM ESOPHAGEAL REFLUX  08-11-11  PITUITARY EXCISION  10/1997  POSTERIOR REPAIR    TRANSNASAL APPROACH N/A 06/04/2020  Procedure: TRANSNASAL APPROACH;  Surgeon: Ammon Bales, MD;  Location: [facility] OR;  Service: ENT;  Laterality: N/A;  TRANSPHENOIDAL /  TRANSNASAL HYPOPHYSECTOMY / RESECTION PITUITARY TUMOR  08-11-11 Florina Husbands 04/29/2024, 12:42 PM  ECHOCARDIOGRAM COMPLETE Result Date: 04/27/2024    ECHOCARDIOGRAM REPORT   Patient Name:   TAKILA KRONBERG Date of Exam: 04/27/2024 Medical Rec #:  [id number]      Height:       65.0 in Accession #:    [id number]     Weight:       125.4 lb Date of Birth:  1941/02/09       BSA:          1.622 m Patient Age:    82 years       BP:           91/54 mmHg Patient Gender: F              HR:           99 bpm. Exam Location:  [facility] Procedure: 2D Echo, Color Doppler and Cardiac Doppler (Both Spectral and Color            Flow Doppler were utilized during procedure). Indications:    I50.31 Acute diastolic (congestive) heart failure  History:        Patient has prior history of Echocardiogram examinations, most                 recent 03/13/2024. Risk Factors:Hypertension, Diabetes and                 Dyslipidemia.  Sonographer:    Sherline Distel Senior RDCS Referring Phys: [id number] PRATIK D [facility] IMPRESSIONS  1. Left ventricular ejection fraction, by estimation, is 60 to 65%. The left ventricle has normal function. The left ventricle has no regional wall motion abnormalities. Left ventricular diastolic parameters are indeterminate.  2. Right ventricular systolic function is normal. The right ventricular size is normal.  3. Left atrial size was mildly dilated.  4. MVA by  PT1/2 1.23 cm2 with men gradient 12 peak 19 mmHg at HR of 101 bpm. The mitral valve is abnormal. Mild mitral valve regurgitation. Moderate mitral stenosis. Moderate mitral annular calcification.  5. The aortic valve is tricuspid. There is mild calcification of the aortic valve. There is mild thickening of the aortic valve. Aortic valve regurgitation is trivial. Aortic valve sclerosis is present, with no evidence of aortic valve stenosis.  6. The inferior vena cava is normal in size with greater than 50% respiratory variability, suggesting right atrial pressure of 3 mmHg.  FINDINGS  Left Ventricle: Left ventricular ejection fraction, by estimation, is 60 to 65%. The left ventricle has normal function. The left ventricle has no regional wall motion abnormalities. Strain was performed and the global longitudinal strain is indeterminate. The left ventricular internal cavity size was normal in size. There is no left ventricular hypertrophy. Left ventricular diastolic parameters are indeterminate. Right Ventricle: The right ventricular size is normal. No increase in right ventricular wall thickness. Right ventricular systolic function is normal. Left Atrium: Left atrial size was mildly dilated. Right Atrium: Right atrial size was normal in size. Pericardium: There is no evidence of pericardial effusion. Mitral Valve: MVA by PT1/2 1.23 cm2 with men gradient 12 peak 19 mmHg at HR of 101 bpm. The mitral valve is abnormal. There is moderate thickening of the mitral valve leaflet(s). There is moderate calcification of the mitral valve leaflet(s). Moderate mitral annular calcification. Mild mitral valve regurgitation. Moderate mitral valve stenosis. MV peak gradient, 19.1 mmHg. The mean mitral valve gradient is 12.7 mmHg. Tricuspid Valve: The tricuspid valve is normal in structure. Tricuspid valve regurgitation is trivial. No evidence of tricuspid stenosis. Aortic Valve: The aortic valve is tricuspid. There is mild calcification of the aortic valve. There is mild thickening of the aortic valve. Aortic valve regurgitation is trivial. Aortic valve sclerosis is present, with no evidence of aortic valve stenosis. Pulmonic Valve: The pulmonic valve was normal in structure. Pulmonic valve regurgitation is not visualized. No evidence of pulmonic stenosis. Aorta: The aortic root is normal in size and structure. Venous: The inferior vena cava is normal in size with greater than 50% respiratory variability, suggesting right atrial pressure of 3 mmHg. IAS/Shunts: No atrial level shunt detected by color  flow Doppler. Additional Comments: 3D was performed not requiring image post processing on an independent workstation and was indeterminate.  LEFT VENTRICLE PLAX 2D LVIDd:         3.70 cm LVIDs:         2.90 cm LV PW:         0.70 cm LV IVS:        0.80 cm LVOT diam:     1.80 cm LV SV:         55 LV SV Index:   34 LVOT Area:     2.54 cm  RIGHT VENTRICLE RV S prime:     13.80 cm/s TAPSE (M-mode): 1.8 cm LEFT ATRIUM             Index        RIGHT ATRIUM           Index LA diam:        3.80 cm 2.34 cm/m   RA Area:     13.30 cm LA Vol (A2C):   60.3 ml 37.17 ml/m  RA Volume:   28.90 ml  17.81 ml/m LA Vol (A4C):   45.0 ml 27.74 ml/m LA Biplane Vol: 55.6 ml 34.27 ml/m  AORTIC VALVE LVOT Vmax:   115.00 cm/s LVOT Vmean:  76.200 cm/s LVOT VTI:    0.216 m  AORTA Ao Root diam: 3.00 cm Ao Asc diam:  3.20 cm MITRAL VALVE MV Area VTI:  1.15 cm    SHUNTS MV Peak grad: 19.1 mmHg   Systemic VTI:  0.22 m MV Mean grad: 12.7 mmHg   Systemic Diam: 1.80 cm MV Vmax:      2.18 m/s MV Vmean:     172.3 cm/s Janelle Mediate MD Electronically signed by Janelle Mediate MD Signature Date/Time: 04/27/2024/2:17:05 PM    Final     Demaris Fillers, DO  [facility]  If 7PM-7AM, please contact night-coverage [email or web address] Password [email or web address] 05/27/2024, 8:28 AM   LOS: 5 days

## 2024-05-27 NOTE — Discharge Summary (Addendum)
 Physician Discharge Summary   Patient: [name] White MRN: [id number] DOB: 05-Apr-1941  Admit date:     05/22/2024  Discharge date: 05/28/2024  Discharge Physician: Myrtie Atkinson Kinan Safley   PCP: Omie Bickers, MD   Recommendations at discharge:   Please follow up with primary care provider within 1-2 weeks  Please repeat BMP and CBC in one week    Hospital Course: 83 year old female with a history of diabetes mellitus type 2, stroke, hypertension, COPD, hypothyroidism, hyperlipidemia, anxiety, PSVT presenting with coughing, chest congestion, shortness of breath.  The patient denies any fevers, chills, chest pain, nausea, vomiting or diarrhea.  She denies any hemoptysis.  She was recently admitted to the hospital from 05/10/2024 to 05/15/2024.  During that hospitalization, the patient was treated for COPD exacerbation and what was felt to be atypical pneumonia.  She was discharged with a prednisone  taper.  In addition, the patient was also admitted from 04/24/2024 to 05/01/2024 with septic shock secondary pneumonia. She has had increasing generalized weakness since discharge from the hospital.  There is no hematochezia or melena.  She does complain of some dysuria for the past week. In the ED, the patient was afebrile with soft blood pressures in the low 90s and upper 80s.  WBC 17.1, hemoglobin 12.5, platelet 236.  Sodium 130, potassium 4.2, bicarbonate 26, serum creatinine 0.82.  LFTs were unremarkable.  Chest x-ray showed chronic interstitial markings.  Lactic acid 2.8.  The patient was started on linezolid  and cefepime .  Assessment and Plan:  Severe sepsis - Presented with leukocytosis, lactic acid 2.8, low BPs - Secondary to pneumonia and UTI - 05/10/24 UA >50 WBC - 05/10/24 urine culture = VRE - Continue linezolid --completed 6 days during hospitalization - continue cefepime --completed 6 days during hospitalization - Continue azithromycin --completed 7 days during hospitalization - remains on levophed  1  mg/kg/min>>not required last 24 hours - increased midodrine  to 10 mg tid--continue after d/c - received short course florinef 0.1 mg - sepsis physiology resolved   VRE UTI - Continue linezolid --completed 6 days during hospitalizaiton   Acute respiratory failure with hypoxia - Secondary to COPD exacerbation, pneumonia - Presented with oxygen saturation 88% on room air - Stable on 2 L>>weaned to RA and remained stable - Wean oxygen as tolerated - given lasix  IV x 1>>start lasix  40 mg po daily>>hold now since BPs remain labile - COVID/RSV/Flu--neg - viral respiratory panel--neg - CTA chest--neg PE;  scattered GGO bilateral UL.  Bibasilar patchy consolidation - restart breo after d/c;  add incruse after d/c - d/c with cefdinir x 2 more days after d/c   COPD exacerbation - Continue Solu-Medrol >>wean to prednisone  6/1 - Continue DuoNebs - Continue Brovana  - Continue Pulmicort  - continue Yupelri  - restart breo after d/c;  add incruse after d/c   Hyperosmolar nonketotic state -due to steroids -serum glucose up to 687 -5/29 started insulin  drip>>transition to [facility] insulin  5/30 -start semglee  10 units and Novolog  q 4 hour -add novolog  6 units with meals -advised family not to bring outside food - CBGs improved with de-escalation of steroids   Hyperlipidemia - Continue statin   Controlled diabetes mellitus type 2 with hyperglycemia - 04/25/2024 hemoglobin A1c 7.0 - Holding metformin --restart after d/c - NovoLog  sliding scale   Chronic pain - Continue Lyrica    History of stroke - Continue aspirin , Plavix , statin   Depression/anxiety - Hold Trintellix  and venlafaxine  temporarily while getting linezolid  -restart after d/c   Hypothyroidism - Continue Synthroid    Aspiration pneumonitis -continue abx -speech therapy  eval appreciated>>dys 3 with thin   Hypomagnesemia/hypophosphatemia -repleted -add kphos  Emotional Lability -pt had been off her effexor  during the  hospitalization due to drug-drug interaction with linezolid  -this will be restarted at time of dc -this was explained to daughter          Consultants: none Procedures performed: none  Disposition: Home Diet recommendation:  Cardiac and Carb modified diet DISCHARGE MEDICATION: Allergies as of 05/28/2024       Reactions   Betadine [povidone Iodine] Hives   Penicillins Rash        Medication List     STOP taking these medications    baclofen  10 MG tablet Commonly known as: LIORESAL    dextromethorphan -guaiFENesin  30-600 MG 12hr tablet Commonly known as: MUCINEX  DM   predniSONE  20 MG tablet Commonly known as: DELTASONE        TAKE these medications    acetaminophen  325 MG tablet Commonly known as: TYLENOL  Take 2 tablets (650 mg total) by mouth every 6 (six) hours as needed for mild pain (pain score 1-3) (or Fever >/= 101).   albuterol  108 (90 Base) MCG/ACT inhaler Commonly known as: VENTOLIN  HFA Inhale 2 puffs into the lungs every 4 (four) hours as needed for wheezing or shortness of breath.   aspirin  EC 81 MG tablet Take 81 mg by mouth daily. Swallow whole.   atorvastatin  40 MG tablet Commonly known as: LIPITOR Take 1 tablet (40 mg total) by mouth daily.   cefdinir 300 MG capsule Commonly known as: OMNICEF Take 1 capsule (300 mg total) by mouth 2 (two) times daily.   clopidogrel  75 MG tablet Commonly known as: PLAVIX  Take 1 tablet (75 mg total) by mouth daily.   clotrimazole 1 % cream Commonly known as: LOTRIMIN Apply 1 Application topically 2 (two) times daily.   cycloSPORINE  0.05 % ophthalmic emulsion Commonly known as: RESTASIS  Place 1 drop into both eyes 2 (two) times daily.   dextromethorphan  30 MG/5ML liquid Commonly known as: Delsym  Take 5 mLs (30 mg total) by mouth 2 (two) times daily as needed for cough.   fluticasone  furoate-vilanterol 200-25 MCG/ACT Aepb Commonly known as: Breo Ellipta  Inhale 1 puff into the lungs daily.    furosemide  20 MG tablet Commonly known as: Lasix  Take 1 tablet (20 mg total) by mouth daily.   Gemtesa 75 MG Tabs Generic drug: Vibegron Take 75 mg by mouth at bedtime.   ipratropium-albuterol  0.5-2.5 (3) MG/3ML Soln Commonly known as: DUONEB Take 3 mLs by nebulization every 4 (four) hours as needed (wheezing, shortness of breath).   levothyroxine  50 MCG tablet Commonly known as: SYNTHROID  Take 50 mcg by mouth daily.   metFORMIN  500 MG tablet Commonly known as: GLUCOPHAGE  Take 500 mg by mouth 2 (two) times daily.   multivitamins ther. w/minerals Tabs tablet Take 1 tablet by mouth every morning.   omeprazole 20 MG capsule Commonly known as: PRILOSEC Take 20 mg by mouth every morning.   polyethylene glycol 17 g packet Commonly known as: MIRALAX  / GLYCOLAX  Take 17 g by mouth daily as needed for mild constipation.   potassium chloride  10 MEQ tablet Commonly known as: KLOR-CON  Take 10 mEq by mouth daily.   pregabalin  150 MG capsule Commonly known as: LYRICA  Take 1 capsule (150 mg total) by mouth 2 (two) times daily.   rizatriptan  10 MG tablet Commonly known as: MAXALT  Take 10 mg by mouth every 2 (two) hours as needed for migraine. No more than 2 doses in 24 hours   senna  8.6 MG Tabs tablet Commonly known as: SENOKOT Take 2 tablets by mouth at bedtime.   traZODone  50 MG tablet Commonly known as: DESYREL  Take 1 tablet (50 mg total) by mouth at bedtime as needed for sleep.   Trintellix  10 MG Tabs tablet Generic drug: vortioxetine  HBr Take 10 mg by mouth daily.   umeclidinium bromide 62.5 MCG/ACT Aepb Commonly known as: INCRUSE ELLIPTA Inhale 1 puff into the lungs daily.   venlafaxine  XR 150 MG 24 hr capsule Commonly known as: EFFEXOR -XR Take 150 mg by mouth every morning.        Discharge Exam: Filed Weights   05/22/24 1112 05/23/24 0400 05/27/24 0500  Weight: 58.9 kg 58.9 kg 58.7 kg   HEENT:  [place]/AT, No thrush, no icterus CV:  RRR, no rub, no S3, no  S4 Lung: bibasilar rales.  No wheeze Abd:  soft/+BS, NT Ext:  No edema, no lymphangitis, no synovitis, no rash   Condition at discharge: stable  The results of significant diagnostics from this hospitalization (including imaging, microbiology, ancillary and laboratory) are listed below for reference.   Imaging Studies: CT Angio Chest Pulmonary Embolism (PE) W or WO Contrast Result Date: 05/22/2024 CLINICAL DATA:  Pulmonary embolism suspected, low to intermediate probability, negative D-dimer. Shortness of breath and respiratory failure. EXAM: CT ANGIOGRAPHY CHEST WITH CONTRAST TECHNIQUE: Multidetector CT imaging of the chest was performed using the standard protocol during bolus administration of intravenous contrast. Multiplanar CT image reconstructions and MIPs were obtained to evaluate the vascular anatomy. RADIATION DOSE REDUCTION: This exam was performed according to the departmental dose-optimization program which includes automated exposure control, adjustment of the mA and/or kV according to patient size and/or use of iterative reconstruction technique. CONTRAST:  75mL OMNIPAQUE  IOHEXOL  350 MG/ML SOLN COMPARISON:  04/24/2024. FINDINGS: Cardiovascular: The heart is borderline enlarged and there is no pericardial effusion. There are calcifications in the mitral valve annulus. There is mild atherosclerotic calcification of the aorta without evidence of aneurysm. The pulmonary trunk is normal in caliber. There is no evidence of pulmonary embolism. Mediastinum/Nodes: No mediastinal, hilar, or axillary lymphadenopathy. The trachea and esophagus are within normal limits. Lungs/Pleura: There are small bilateral pleural effusions, greater on the left than on the right. Patchy atelectasis and consolidation is noted in the lower lobes bilaterally. A few scattered ground-glass opacities are noted in the upper lobes bilaterally. No pneumothorax is seen. There is a 3 mm nodule in the posterior segment of the  left upper lobe, axial image 64. Upper Abdomen: The gallbladder is surgically absent. There is stable nodularity of the left adrenal gland, previously characterized as adenoma is. No acute abnormality is seen. Musculoskeletal: Degenerative changes are present in the thoracic spine. No acute osseous abnormality is seen. Review of the MIP images confirms the above findings. IMPRESSION: 1. No evidence of pulmonary embolism. 2. Scattered ground-glass opacities in the upper lobes bilaterally and patchy consolidation at the lung bases, suspicious for multifocal pneumonia, slightly increased at the left lung base. 3. Small bilateral pleural effusions. 4. 3 mm left upper lobe pulmonary nodule. No follow-up needed if patient is low-risk.This recommendation follows the consensus statement: Guidelines for Management of Incidental Pulmonary Nodules Detected on CT Images: From the Fleischner Society 2017; Radiology 2017; 284:228-243. 5. Aortic atherosclerosis. Electronically Signed   By: Wyvonnia Heimlich M.D.   On: 05/22/2024 20:29   DG Chest Portable 1 View Result Date: 05/22/2024 CLINICAL DATA:  cough EXAM: PORTABLE CHEST - 1 VIEW COMPARISON:  May 10, 2024 FINDINGS: Low  lung volumes. No focal airspace consolidation, pleural effusion, or pneumothorax. No cardiomegaly. Tortuous aorta with aortic atherosclerosis. No acute fracture or destructive lesion. Multilevel thoracic osteophytosis. Osteopenia. IMPRESSION: No acute cardiopulmonary abnormality. Electronically Signed   By: Rance Burrows M.D.   On: 05/22/2024 13:50   DG Chest Port 1 View Result Date: 05/10/2024 CLINICAL DATA:  Questionable sepsis EXAM: PORTABLE CHEST 1 VIEW COMPARISON:  Chest x-ray 04/26/2024.  Chest CT 04/24/2004. FINDINGS: The heart size and mediastinal contours are within normal limits. Both lungs are clear. The visualized skeletal structures are unremarkable. IMPRESSION: No active disease. Electronically Signed   By: Tyron Gallon M.D.   On: 05/10/2024  15:17   DG Swallowing Func-Speech Pathology Result Date: 04/29/2024 Table formatting from the original result was not included. Modified Barium Swallow Study Patient Details Name: DOSHIA DALIA MRN: [id number] Date of Birth: 1941-01-03 Today's Date: 04/29/2024 HPI/PMH: HPI: BRIGHTEN BUZZELLI is a 83 y.o. female with medical history significant for diabetes, hypertension, stroke, pituitary adenoma, PSVT, anxiety and depression.  Patient was brought to the ED from her group facility with reports of confusion that started today, also with cough, fever.   At the time of my evaluation, patient is lethargic, responds to direction but unable to answer questions or give history. Recent hospitalization 3/17 to 3/25 for acute/early subacute stroke with concomitant acute metabolic encephalopathy.  Was discharged to SNF.     Patient has been admitted with septic shock secondary to multifocal pneumonia along with associated acute hypoxemic respiratory failure and acute metabolic encephalopathy. BSE requested. Clinical Impression: Patient presents with oropharyngeal swallowing to be essentially [facility]. Note one episode of flash frank penetration of thin liquids that was cleared during the swallow and occasional premature spillage of thin liquids penetrated before the swallow but was cleared during the swallow every time. Swallow is often triggered at the posterior angle of the ramus but sometimes is delayed until bolus fills the pyriform sinuses. Pharyngeal stripping wave is slightly diminished resulting in occasional trace pharyngeal residue. Note suspected small Zenker's Diverticulum (ZD) - no radiologist present to confirm. Residue in ZD with retrograde movement back up to the pyriforms. Repeat dry swallow clears retrograde residue from the pyriforms, however note it again collects in the ZD. No retrograde residue was penetrated or aspirated. Esophageal sweep was unremarkable. Recommend upgrade Pt's diet to D3/mech soft and continue  with thin liquids. Recommend Pt utilize repeat swallow with every sip/bite to ensure trace/min retrograde residue is cleared from pharynx. Reviewed findings with patient and RN. There are no further ST needs noted at this time, our service will sign off. Factors that may increase risk of adverse event in presence of aspiration Roderick Civatte & Jessy Morocco 2021): No data recorded Recommendations/Plan: Swallowing Evaluation Recommendations Swallowing Evaluation Recommendations Recommendations: PO diet PO Diet Recommendation: Dysphagia 3 (Mechanical soft); Thin liquids (Level 0) Liquid Administration via: Cup; Straw Medication Administration: Whole meds with liquid Supervision: Patient able to self-feed Swallowing strategies  : Slow rate; Small bites/sips; Multiple dry swallows after each bite/sip Postural changes: Position pt fully upright for meals Oral care recommendations: Oral care BID (2x/day) Treatment Plan Treatment Plan Treatment recommendations: Therapy as outlined in treatment plan below Follow-up recommendations: No SLP follow up Interventions: Aspiration precaution training Recommendations Recommendations for follow up therapy are one component of a multi-disciplinary discharge planning process, led by the attending physician.  Recommendations may be updated based on patient status, additional functional criteria and insurance authorization. Assessment: Orofacial Exam: Orofacial Exam Oral Cavity: Oral Hygiene:  WFL Oral Cavity - Dentition: Adequate natural dentition; Missing dentition Orofacial Anatomy: WFL Oral Motor/Sensory Function: WFL Anatomy: Anatomy: WFL Boluses Administered: Boluses Administered Boluses Administered: Thin liquids (Level 0); Mildly thick liquids (Level 2, nectar thick); Moderately thick liquids (Level 3, honey thick); Puree; Solid  Oral Impairment Domain: Oral Impairment Domain Lip Closure: No labial escape Tongue control during bolus hold: Not tested Bolus preparation/mastication: Timely and  efficient chewing and mashing Bolus transport/lingual motion: Brisk tongue motion Oral residue: Complete oral clearance Initiation of pharyngeal swallow : Posterior angle of the ramus; Pyriform sinuses  Pharyngeal Impairment Domain: Pharyngeal Impairment Domain Soft palate elevation: No bolus between soft palate (SP)/pharyngeal wall (PW) Laryngeal elevation: Complete superior movement of thyroid  cartilage with complete approximation of arytenoids to epiglottic petiole Anterior hyoid excursion: Complete anterior movement Epiglottic movement: Complete inversion Laryngeal vestibule closure: Complete, no air/contrast in laryngeal vestibule; Incomplete, narrow column air/contrast in laryngeal vestibule Pharyngeal stripping wave : Present - complete Pharyngeal contraction (A/P view only): N/A Pharyngoesophageal segment opening: Complete distension and complete duration, no obstruction of flow Tongue base retraction: No contrast between tongue base and posterior pharyngeal wall (PPW) Pharyngeal residue: Trace residue within or on pharyngeal structures Location of pharyngeal residue: Tongue base; Valleculae  Esophageal Impairment Domain: Esophageal Impairment Domain Esophageal clearance upright position: Esophageal retention with retrograde flow below pharyngoesophageal segment (PES) (small Zenker's Diverticulum) Pill: Pill Consistency administered: Thin liquids (Level 0) Thin liquids (Level 0): [facility] Penetration/Aspiration Scale Score: Penetration/Aspiration Scale Score 1.  Material does not enter airway: Thin liquids (Level 0); Mildly thick liquids (Level 2, nectar thick); Moderately thick liquids (Level 3, honey thick); Solid; Puree; Pill 2.  Material enters airway, remains ABOVE vocal cords then ejected out: Thin liquids (Level 0) Compensatory Strategies: Compensatory Strategies Compensatory strategies: Yes Multiple swallows: Effective   General Information: Caregiver present: No  Diet Prior to this Study: Dysphagia 2  (finely chopped); Thin liquids (Level 0)   Temperature : Normal   Respiratory Status: WFL   Supplemental O2: Nasal cannula   History of Recent Intubation: No  Behavior/Cognition: Alert; Cooperative; Pleasant mood; Confused Self-Feeding Abilities: Able to self-feed Baseline vocal quality/speech: Normal Volitional Cough: Able to elicit Volitional Swallow: Able to elicit No data recorded Goal Planning: No data recorded No data recorded No data recorded No data recorded No data recorded Pain: Pain Assessment Pain Assessment: No/denies pain End of Session: Start Time:SLP Start Time (ACUTE ONLY): 1121 Stop Time: SLP Stop Time (ACUTE ONLY): 1154 Time Calculation:SLP Time Calculation (min) (ACUTE ONLY): 33 min Charges: SLP Evaluations $ SLP Speech Visit: 1 Visit SLP Evaluations $MBS Swallow: 1 Procedure SLP visit diagnosis: SLP Visit Diagnosis: Dysphagia, unspecified (R13.10) Past Medical History: Past Medical History: Diagnosis Date  Anxiety   Asthma   ASYMPTOMATIC POSTMENOPAUSAL STATUS 02/09/2009  Cataract   Constipation 03/19/2013  Depression   DIABETES MELLITUS, TYPE II 09/14/2007  HYPERCHOLESTEROLEMIA 02/09/2009  HYPERTENSION 02/09/2009  HYPOTHYROIDISM 09/14/2007  MIGRAINE HEADACHE 09/14/2007  Neuropathy   OSTEOPOROSIS 02/09/2009  PANCREATITIS 09/14/2007  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  PONV (postoperative nausea and vomiting)   Rectocele 03/19/2013  Shortness of breath   SUPERFICIAL PHLEBITIS 09/14/2007  Varicose veins  Past Surgical History: Past Surgical History: Procedure Laterality Date  ABDOMINAL HYSTERECTOMY    ANTERIOR AND POSTERIOR REPAIR N/A 04/02/2013  Procedure: ANTERIOR (CYSTOCELE) AND POSTERIOR REPAIR (RECTOCELE);  Surgeon: Albino Hum, MD;  Location: [facility];  Service: Gynecology;  Laterality: N/A;  APPENDECTOMY    BRAIN SURGERY    CATARACT EXTRACTION W/PHACO  12/05/2011  Procedure: CATARACT EXTRACTION PHACO AND INTRAOCULAR LENS PLACEMENT (IOC);  Surgeon: Anner Kill;  Location: [facility];  Service: Ophthalmology;   Laterality: Left;  CDE:9.65  CHOLECYSTECTOMY    CRANIOTOMY N/A 06/04/2020  Procedure: Endoscopic Transphenoidal Resection of Recurrent PituitaryTumor;  Surgeon: Elna Haggis, MD;  Location: [facility] OR;  Service: Neurosurgery;  Laterality: N/A;  CYSTOSCOPY    ENDOVENOUS ABLATION SAPHENOUS VEIN W/ LASER  11-03-2011   right greater saphenous vein  left leg done 10-2011  EYE SURGERY  98  right cataract extraction 98  LAPAROSCOPIC NISSEN FUNDOPLICATION    NM ESOPHAGEAL REFLUX  08-11-11  PITUITARY EXCISION  10/1997  POSTERIOR REPAIR    TRANSNASAL APPROACH N/A 06/04/2020  Procedure: TRANSNASAL APPROACH;  Surgeon: Ammon Bales, MD;  Location: [facility] OR;  Service: ENT;  Laterality: N/A;  TRANSPHENOIDAL / TRANSNASAL HYPOPHYSECTOMY / RESECTION PITUITARY TUMOR  08-11-11 Florina Husbands 04/29/2024, 12:42 PM   Microbiology: Results for orders placed or performed during the hospital encounter of 05/22/24  Culture, blood (routine x 2)     Status: None   Collection Time: 05/22/24 11:31 AM   Specimen: BLOOD  Result Value Ref Range Status   Specimen Description BLOOD BLOOD LEFT ARM  Final   Special Requests   Final    BOTTLES DRAWN AEROBIC AND ANAEROBIC Blood Culture adequate volume   Culture   Final    NO GROWTH 5 DAYS Performed at [facility], [address] [place], [place] [address]    Report Status 05/27/2024 FINAL  Final  Culture, blood (routine x 2)     Status: None   Collection Time: 05/22/24 11:34 AM   Specimen: BLOOD  Result Value Ref Range Status   Specimen Description BLOOD BLOOD RIGHT HAND  Final   Special Requests   Final    BOTTLES DRAWN AEROBIC AND ANAEROBIC Blood Culture results may not be optimal due to an inadequate volume of blood received in culture bottles   Culture   Final    NO GROWTH 5 DAYS Performed at [facility], [address], [place], [place] [address]    Report Status 05/27/2024 FINAL  Final  Urine Culture     Status: Abnormal   Collection Time: 05/22/24  2:28 PM   Specimen:  Urine, Random  Result Value Ref Range Status   Specimen Description   Final    URINE, RANDOM Performed at [facility], [address], [place], [place] [address]    Special Requests   Final    NONE Reflexed from [phone] Performed at [facility], [address], [place], [place] [address]    Culture (A)  Final    >=100,000 COLONIES/mL ENTEROCOCCUS FAECALIS VANCOMYCIN  RESISTANT ENTEROCOCCUS ISOLATED    Report Status 05/24/2024 FINAL  Final   Organism ID, Bacteria ENTEROCOCCUS FAECALIS (A)  Final      Susceptibility   Enterococcus faecalis - MIC*    AMPICILLIN <=2 SENSITIVE Sensitive     NITROFURANTOIN <=16 SENSITIVE Sensitive     VANCOMYCIN  >=32 RESISTANT Resistant     LINEZOLID  2 SENSITIVE Sensitive     * >=100,000 COLONIES/mL ENTEROCOCCUS FAECALIS  MRSA Next Gen by PCR, Nasal     Status: None   Collection Time: 05/22/24  3:37 PM   Specimen: Nasal Mucosa; Nasal Swab  Result Value Ref Range Status   MRSA by PCR Next Gen NOT DETECTED NOT DETECTED Final    Comment: (NOTE) The GeneXpert MRSA Assay (FDA approved for NASAL specimens only), is one component of a comprehensive MRSA  colonization surveillance program. It is not intended to diagnose MRSA infection nor to guide or monitor treatment for MRSA infections. Test performance is not FDA approved in patients less than 66 years old. Performed at [facility], [address], [place], [place] [address]   Resp panel by RT-PCR (RSV, Flu A&B, Covid) Nasal Mucosa     Status: None   Collection Time: 05/22/24  3:37 PM   Specimen: Nasal Mucosa; Nasal Swab  Result Value Ref Range Status   SARS Coronavirus 2 by RT PCR NEGATIVE NEGATIVE Final    Comment: (NOTE) SARS-CoV-2 target nucleic acids are NOT DETECTED.  The SARS-CoV-2 RNA is generally detectable in upper respiratory specimens during the acute phase of infection. The lowest concentration of SARS-CoV-2 viral copies this assay can detect is 138 copies/mL. A negative result does not  preclude SARS-Cov-2 infection and should not be used as the sole basis for treatment or other patient management decisions. A negative result may occur with  improper specimen collection/handling, submission of specimen other than nasopharyngeal swab, presence of viral mutation(s) within the areas targeted by this assay, and inadequate number of viral copies(<138 copies/mL). A negative result must be combined with clinical observations, patient history, and epidemiological information. The expected result is Negative.  Fact Sheet for Patients:  BloggerCourse.com  Fact Sheet for Healthcare Providers:  SeriousBroker.it  This test is no t yet approved or cleared by the [place]  FDA and  has been authorized for detection and/or diagnosis of SARS-CoV-2 by FDA under an Emergency Use Authorization (EUA). This EUA will remain  in effect (meaning this test can be used) for the duration of the COVID-19 declaration under Section 564(b)(1) of the Act, 21 U.S.C.section 360bbb-3(b)(1), unless the authorization is terminated  or revoked sooner.       Influenza A by PCR NEGATIVE NEGATIVE Final   Influenza B by PCR NEGATIVE NEGATIVE Final    Comment: (NOTE) The Xpert Xpress SARS-CoV-2/FLU/RSV plus assay is intended as an aid in the diagnosis of influenza from Nasopharyngeal swab specimens and should not be used as a sole basis for treatment. Nasal washings and aspirates are unacceptable for Xpert Xpress SARS-CoV-2/FLU/RSV testing.  Fact Sheet for Patients: BloggerCourse.com  Fact Sheet for Healthcare Providers: SeriousBroker.it  This test is not yet approved or cleared by the [place]  FDA and has been authorized for detection and/or diagnosis of SARS-CoV-2 by FDA under an Emergency Use Authorization (EUA). This EUA will remain in effect (meaning this test can be used) for the  duration of the COVID-19 declaration under Section 564(b)(1) of the Act, 21 U.S.C. section 360bbb-3(b)(1), unless the authorization is terminated or revoked.     Resp Syncytial Virus by PCR NEGATIVE NEGATIVE Final    Comment: (NOTE) Fact Sheet for Patients: BloggerCourse.com  Fact Sheet for Healthcare Providers: SeriousBroker.it  This test is not yet approved or cleared by the [place]  FDA and has been authorized for detection and/or diagnosis of SARS-CoV-2 by FDA under an Emergency Use Authorization (EUA). This EUA will remain in effect (meaning this test can be used) for the duration of the COVID-19 declaration under Section 564(b)(1) of the Act, 21 U.S.C. section 360bbb-3(b)(1), unless the authorization is terminated or revoked.  Performed at [facility], [address], [place], [place] [address]   Respiratory (~20 pathogens) panel by PCR     Status: None   Collection Time: 05/23/24 10:30 AM   Specimen: Nasopharyngeal Swab; Respiratory  Result Value Ref Range Status   Adenovirus NOT  DETECTED NOT DETECTED Final   Coronavirus 229E NOT DETECTED NOT DETECTED Final    Comment: (NOTE) The Coronavirus on the Respiratory Panel, DOES NOT test for the novel  Coronavirus (2019 nCoV)    Coronavirus HKU1 NOT DETECTED NOT DETECTED Final   Coronavirus NL63 NOT DETECTED NOT DETECTED Final   Coronavirus OC43 NOT DETECTED NOT DETECTED Final   Metapneumovirus NOT DETECTED NOT DETECTED Final   Rhinovirus / Enterovirus NOT DETECTED NOT DETECTED Final   Influenza A NOT DETECTED NOT DETECTED Final   Influenza B NOT DETECTED NOT DETECTED Final   Parainfluenza Virus 1 NOT DETECTED NOT DETECTED Final   Parainfluenza Virus 2 NOT DETECTED NOT DETECTED Final   Parainfluenza Virus 3 NOT DETECTED NOT DETECTED Final   Parainfluenza Virus 4 NOT DETECTED NOT DETECTED Final   Respiratory Syncytial Virus NOT DETECTED NOT DETECTED Final   Bordetella  pertussis NOT DETECTED NOT DETECTED Final   Bordetella Parapertussis NOT DETECTED NOT DETECTED Final   Chlamydophila pneumoniae NOT DETECTED NOT DETECTED Final   Mycoplasma pneumoniae NOT DETECTED NOT DETECTED Final    Comment: Performed at [facility], [address], [place], [place] [address]    Labs: CBC: Recent Labs  Lab 05/22/24 1134 05/23/24 0411 05/24/24 0428 05/27/24 0415 05/27/24 0848 05/28/24 0510  WBC 17.1* 16.9* 28.1* 12.2* 12.4* 12.2*  NEUTROABS 13.5*  --   --   --   --   --   HGB 12.5 13.4 13.5 10.4* 10.5* 10.3*  HCT 37.6 39.6 41.9 31.0* 32.2* 31.0*  MCV 90.4 88.8 91.7 89.9 92.3 91.2  PLT 236 271 278 251 245 254   Basic Metabolic Panel: Recent Labs  Lab 05/24/24 0554 05/24/24 0835 05/25/24 0300 05/26/24 0550 05/27/24 0415 05/28/24 0510  NA 135 136 139 138 139 136  K 3.7 3.6 3.9 3.6 3.5 3.6  CL 100 100 103 102 105 106  CO2 25 27 26 27 27 26   GLUCOSE 216* 98 208* 117* 194* 83  BUN 25* 23 21 17 18 17   CREATININE 0.71 0.64 0.61 0.53 0.57 0.48  CALCIUM  8.2* 8.1* 8.0* 7.8* 7.7* 8.1*  MG 1.9  --  1.8 1.7 2.1 1.9  PHOS  --   --  2.4* 2.6 2.9 3.2   Liver Function Tests: Recent Labs  Lab 05/22/24 1134  AST 16  ALT 33  ALKPHOS 51  BILITOT 1.0  PROT 6.2*  ALBUMIN 2.9*   CBG: Recent Labs  Lab 05/27/24 1641 05/27/24 2110 05/28/24 0009 05/28/24 0417 05/28/24 0736  GLUCAP 311* 347* 296* 101* 91    Discharge time spent: greater than 30 minutes.  Signed: Demaris Fillers, MD [facility] 05/28/2024

## 2024-05-28 DIAGNOSIS — A419 Sepsis, unspecified organism: Secondary | ICD-10-CM | POA: Diagnosis not present

## 2024-05-28 DIAGNOSIS — A4181 Sepsis due to Enterococcus: Secondary | ICD-10-CM | POA: Diagnosis not present

## 2024-05-28 DIAGNOSIS — J9601 Acute respiratory failure with hypoxia: Secondary | ICD-10-CM | POA: Diagnosis not present

## 2024-05-28 DIAGNOSIS — J690 Pneumonitis due to inhalation of food and vomit: Secondary | ICD-10-CM | POA: Diagnosis not present

## 2024-05-28 LAB — BASIC METABOLIC PANEL WITH GFR
Anion gap: 4 — ABNORMAL LOW (ref 5–15)
BUN: 17 mg/dL (ref 8–23)
CO2: 26 mmol/L (ref 22–32)
Calcium: 8.1 mg/dL — ABNORMAL LOW (ref 8.9–10.3)
Chloride: 106 mmol/L (ref 98–111)
Creatinine, Ser: 0.48 mg/dL (ref 0.44–1.00)
GFR, Estimated: >60 mL/min (ref >60)
Glucose, Bld: 83 mg/dL (ref 70–99)
Potassium: 3.6 mmol/L (ref 3.5–5.1)
Sodium: 136 mmol/L (ref 135–145)

## 2024-05-28 LAB — CBC
HCT: 31 % — ABNORMAL LOW (ref 36.0–46.0)
Hemoglobin: 10.3 g/dL — ABNORMAL LOW (ref 12.0–15.0)
MCH: 30.3 pg (ref 26.0–34.0)
MCHC: 33.2 g/dL (ref 30.0–36.0)
MCV: 91.2 fL (ref 80.0–100.0)
Platelets: 254 10*3/uL (ref 150–400)
RBC: 3.4 MIL/uL — ABNORMAL LOW (ref 3.87–5.11)
RDW: 17.6 % — ABNORMAL HIGH (ref 11.5–15.5)
WBC: 12.2 10*3/uL — ABNORMAL HIGH (ref 4.0–10.5)
nRBC: 0 % (ref 0.0–0.2)

## 2024-05-28 LAB — GLUCOSE, CAPILLARY
Glucose-Capillary: 101 mg/dL — ABNORMAL HIGH (ref 70–99)
Glucose-Capillary: 286 mg/dL — ABNORMAL HIGH (ref 70–99)
Glucose-Capillary: 296 mg/dL — ABNORMAL HIGH (ref 70–99)
Glucose-Capillary: 91 mg/dL (ref 70–99)

## 2024-05-28 LAB — PHOSPHORUS: Phosphorus: 3.2 mg/dL (ref 2.5–4.6)

## 2024-05-28 LAB — MAGNESIUM: Magnesium: 1.9 mg/dL (ref 1.7–2.4)

## 2024-05-28 MED ORDER — VENLAFAXINE HCL ER 75 MG PO CP24
150.0000 mg | ORAL_CAPSULE | Freq: Every day | ORAL | Status: DC
  Administered 2024-05-28: 150 mg via ORAL
  Filled 2024-05-28: qty 2

## 2024-05-28 MED ORDER — ENSURE PLUS HIGH PROTEIN PO LIQD
237.0000 mL | Freq: Two times a day (BID) | ORAL | Status: DC
  Administered 2024-05-28: 237 mL via ORAL

## 2024-05-28 MED ORDER — CEFDINIR 300 MG PO CAPS: 300.0000 mg | ORAL_CAPSULE | Freq: Two times a day (BID) | ORAL | 0 refills | Status: DC

## 2024-05-28 NOTE — Care Management Important Message (Signed)
 Important Message  Patient Details  Name: [name] White MRN: [id number] Date of Birth: 06-Jan-1941   Important Message Given:  Yes - Medicare IM     [name] 05/28/2024, 11:25 AM

## 2024-05-28 NOTE — TOC Transition Note (Signed)
 Transition of Care [facility]) - Discharge Note   Patient Details  Name: [name] White MRN: [id number] Date of Birth: April 24, 1941  [facility]) CM/SW Contact:  Grandville Lax, LCSWA Phone Number: 05/28/2024, 11:08 AM  Clinical Narrative:    CSW updated that pt is medically stable for D/C back to [facility] today. CSW spoke to Dolores with ALF who states they can accept pt back. D/C summary and Fl2 faxed to facility at their request, Lamond Pilot reviewed and confirmed they are ready for pt to return. MD spoke with daughter and she is aware of D/C. CSW updated Sarah with [facility] that pt will D/C home today, MD placed HH orders. Facility will arrive to pick pt up, RN updated. TOC signing off.   Final next level of care: Assisted Living Barriers to Discharge: Barriers Resolved   Patient Goals and CMS Choice Patient states their goals for this hospitalization and ongoing recovery are:: return to ALF CMS Medicare.gov Compare Post Acute Care list provided to:: Patient Represenative (must comment) Choice offered to / list presented to : Adult Children      Discharge Placement                Patient to be transferred to facility by: Facility staff Name of family member notified: Daughter Patient and family notified of of transfer: 05/28/24  Discharge Plan and Services Additional resources added to the After Visit Summary for   In-house Referral: Clinical Social Work Discharge Planning Services: CM Consult Post Acute Care Choice: Home Health                    HH Arranged: RN, PT [facility] Agency: [facility] Date [employer] Agency Contacted: 05/28/24   Representative spoke with at [facility] Agency: Isa Manuel  Social Drivers of Health (SDOH) Interventions SDOH Screenings   Food Insecurity: No Food Insecurity (05/22/2024)  Housing: Low Risk  (05/22/2024)  Recent Concern: Housing - High Risk (05/10/2024)  Transportation Needs: No Transportation Needs (05/22/2024)  Utilities: Not At Risk  (05/22/2024)  Depression (PHQ2-9): Low Risk  (01/28/2020)  Social Connections: Socially Isolated (05/22/2024)  Tobacco Use: Low Risk  (05/22/2024)     Readmission Risk Interventions    05/23/2024   10:18 AM 04/25/2024    1:46 PM 12/27/2023    1:21 PM  Readmission Risk Prevention Plan  Transportation Screening Complete Complete Complete  PCP or Specialist Appt within 3-5 Days  Not Complete   Home Care Screening   Complete  Medication Review (RN CM)   Complete  HRI or Home Care Consult Complete Complete   Social Work Consult for Recovery Care Planning/Counseling Complete Complete   Palliative Care Screening Not Applicable Not Complete   Medication Review Oceanographer) Complete Complete

## 2024-05-28 NOTE — [place] FL2 (Signed)
 [place]  MEDICAID FL2 LEVEL OF CARE FORM     IDENTIFICATION  Patient Name: [name] White Birthdate: June 17, 1941 Sex: female Admission Date (Current Location): 05/22/2024  [facility] and [place] Number:  [employer] and Address:  [facility],  [address], [name] [address]      Provider Number: [id number]  Attending Physician Name and Address:  Demaris Fillers, MD  Relative Name and Phone Number:       Current Level of Care: Hospital Recommended Level of Care: Assisted Living Facility Prior Approval Number:    Date Approved/Denied:   PASRR Number:    Discharge Plan: Other (Comment) (ALF)    Current Diagnoses: Patient Active Problem List   Diagnosis Date Noted   Aspiration pneumonitis (HCC) 05/23/2024   Sepsis due to Enterococcus (HCC) 05/23/2024   Sepsis due to undetermined organism (HCC) 05/22/2024   Severe sepsis (HCC) 05/10/2024   Acute on chronic respiratory failure with hypoxia (HCC) 05/10/2024   Hypomagnesemia 05/01/2024   Septic shock (HCC) 04/24/2024   CAP (community acquired pneumonia) 04/24/2024   Acute respiratory failure with hypoxia (HCC) 04/24/2024   CVA (cerebral vascular accident) (HCC) 03/12/2024   Acute lower UTI 03/12/2024   Type 2 diabetes mellitus with peripheral neuropathy (HCC) 03/12/2024   Anxiety and depression 03/12/2024   GERD without esophagitis 03/12/2024   Acute encephalopathy 03/11/2024   Rhabdomyolysis 12/27/2023   Pressure injury of skin 12/27/2023   UTI (urinary tract infection) 12/26/2023   Sepsis due to urinary tract infection (HCC) 12/26/2023   Acute metabolic encephalopathy 12/26/2023   Status post transsphenoidal pituitary resection (HCC) 06/04/2020   Pituitary adenoma with extrasellar extension (HCC) 06/04/2020   Pituitary tumor 05/29/2020   Dizzinesses 02/02/2020   Acute pain of right shoulder 02/02/2020   Encounter for screening mammogram for malignant neoplasm of breast 01/28/2020   Vitamin D   deficiency 01/28/2020   Paroxysmal supraventricular tachycardia (HCC) 06/19/2014   Rectocele 03/19/2013   Constipation 03/19/2013   HYPERCHOLESTEROLEMIA 02/09/2009   Essential hypertension 02/09/2009   Osteoporosis 02/09/2009   Post-menopausal 02/09/2009   HEAT INTOLERANCE 02/01/2008   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   Hypothyroidism 09/14/2007   Diabetes (HCC) 09/14/2007   Migraine headache 09/14/2007   SUPERFICIAL PHLEBITIS 09/14/2007   PANCREATITIS 09/14/2007   MENOPAUSAL SYNDROME 09/14/2007   FATIGUE 09/14/2007    Orientation RESPIRATION BLADDER Height & Weight     Self, Time, Situation, Place  Normal Incontinent Weight: 129 lb 6.6 oz (58.7 kg) Height:  5\' 5"  (165.1 cm)  BEHAVIORAL SYMPTOMS/MOOD NEUROLOGICAL BOWEL NUTRITION STATUS      Continent Diet (Regular)  AMBULATORY STATUS COMMUNICATION OF NEEDS Skin   Limited Assist Verbally Normal                       Personal Care Assistance Level of Assistance  Bathing, Feeding, Dressing Bathing Assistance: Limited assistance Feeding assistance: Independent Dressing Assistance: Limited assistance     Functional Limitations Info  Sight, Hearing, Speech Sight Info: Impaired Hearing Info: Impaired Speech Info: Adequate    SPECIAL CARE FACTORS FREQUENCY  PT (By licensed PT)     PT Frequency: 2-3 times weekly with [facility]              Contractures Contractures Info: Not present    Additional Factors Info  Code Status, Allergies Code Status Info: DNR-Limited Allergies Info: Betadine and Penicillins           Current Medications (05/28/2024):  This is the  current hospital active medication list Current Facility-Administered Medications  Medication Dose Route Frequency Provider Last Rate Last Admin   acetaminophen  (TYLENOL ) tablet 650 mg  650 mg Oral Q6H PRN Tat, Myrtie Atkinson, MD       Or   acetaminophen  (TYLENOL ) suppository 650 mg  650 mg Rectal Q6H PRN Tat, Myrtie Atkinson, MD       albuterol  (PROVENTIL )  (2.5 MG/3ML) 0.083% nebulizer solution 2.5 mg  2.5 mg Nebulization TID PRN Tat, Myrtie Atkinson, MD       arformoterol  (BROVANA ) nebulizer solution 15 mcg  15 mcg Nebulization BID Tat, Myrtie Atkinson, MD   15 mcg at 05/28/24 9562   aspirin  EC tablet 81 mg  81 mg Oral Daily Tat, Myrtie Atkinson, MD   81 mg at 05/28/24 1308   atorvastatin  (LIPITOR) tablet 40 mg  40 mg Oral Daily Tat, Myrtie Atkinson, MD   40 mg at 05/28/24 0844   azithromycin  (ZITHROMAX ) tablet 500 mg  500 mg Oral Daily Tat, David, MD   500 mg at 05/28/24 6578   budesonide  (PULMICORT ) nebulizer solution 0.5 mg  0.5 mg Nebulization BID Tat, Myrtie Atkinson, MD   0.5 mg at 05/28/24 0820   Chlorhexidine  Gluconate Cloth 2 % PADS 6 each  6 each Topical Q0600 Tat, Myrtie Atkinson, MD   6 each at 05/28/24 0520   clopidogrel  (PLAVIX ) tablet 75 mg  75 mg Oral Daily Tat, David, MD   75 mg at 05/28/24 0844   cycloSPORINE  (RESTASIS ) 0.05 % ophthalmic emulsion 1 drop  1 drop Both Eyes BID Tat, David, MD   1 drop at 05/28/24 0844   dextrose  50 % solution 0-50 mL  0-50 mL Intravenous PRN Tat, Myrtie Atkinson, MD       enoxaparin  (LOVENOX ) injection 40 mg  40 mg Subcutaneous Q24H Tat, Myrtie Atkinson, MD   40 mg at 05/27/24 1650   feeding supplement (ENSURE PLUS HIGH PROTEIN) liquid 237 mL  237 mL Oral BID BM Tat, Myrtie Atkinson, MD   237 mL at 05/28/24 0845   fludrocortisone (FLORINEF) tablet 0.1 mg  0.1 mg Oral Daily Tat, Myrtie Atkinson, MD   0.1 mg at 05/28/24 4696   Gerhardt's butt cream   Topical BID Demaris Fillers, MD   Given at 05/28/24 0845   hydrocortisone (ANUSOL-HC) suppository 25 mg  25 mg Rectal BID Tat, Myrtie Atkinson, MD   25 mg at 05/28/24 0845   insulin  aspart (novoLOG ) injection 0-9 Units  0-9 Units Subcutaneous Q4H Demaris Fillers, MD   5 Units at 05/28/24 0017   insulin  aspart (novoLOG ) injection 6 Units  6 Units Subcutaneous TID WC Demaris Fillers, MD   6 Units at 05/27/24 1649   insulin  glargine-yfgn (SEMGLEE ) injection 10 Units  10 Units Subcutaneous Daily Tat, Myrtie Atkinson, MD   10 Units at 05/27/24 1004   levothyroxine  (SYNTHROID ) tablet 50 mcg  50  mcg Oral Daily Tat, Myrtie Atkinson, MD   50 mcg at 05/28/24 0535   midodrine  (PROAMATINE ) tablet 15 mg  15 mg Oral TID WC Demaris Fillers, MD   15 mg at 05/28/24 2952   mirabegron  ER (MYRBETRIQ ) tablet 25 mg  25 mg Oral Daily Tat, Myrtie Atkinson, MD   25 mg at 05/28/24 0844   norepinephrine  (LEVOPHED ) 4mg  in (0.016 mg/mL) premix infusion  0-10 mcg/min Intravenous Titrated Demaris Fillers, MD   Stopped at 05/26/24 2202   ondansetron  (ZOFRAN ) tablet 4 mg  4 mg Oral Q6H PRN Tat, Myrtie Atkinson, MD       Or   ondansetron  (ZOFRAN ) injection 4 mg  4 mg Intravenous  Q6H PRN Tat, Myrtie Atkinson, MD       pantoprazole  (PROTONIX ) EC tablet 40 mg  40 mg Oral Daily Tat, David, MD   40 mg at 05/28/24 0843   phosphorus (K PHOS NEUTRAL) tablet 500 mg  500 mg Oral BID Tat, David, MD   500 mg at 05/28/24 0844   polyethylene glycol (MIRALAX  / GLYCOLAX ) packet 17 g  17 g Oral Daily PRN Tat, Myrtie Atkinson, MD       polyethylene glycol (MIRALAX  / GLYCOLAX ) packet 17 g  17 g Oral BID Tat, Myrtie Atkinson, MD   17 g at 05/28/24 1610   predniSONE  (DELTASONE ) tablet 20 mg  20 mg Oral Q breakfast Tat, Myrtie Atkinson, MD   20 mg at 05/28/24 9604   pregabalin  (LYRICA ) capsule 150 mg  150 mg Oral BID Demaris Fillers, MD   150 mg at 05/28/24 0844   revefenacin  (YUPELRI ) nebulizer solution 175 mcg  175 mcg Nebulization Daily Tat, Myrtie Atkinson, MD   175 mcg at 05/28/24 0816   senna (SENOKOT) tablet 17.2 mg  2 tablet Oral QHS Tat, David, MD   17.2 mg at 05/27/24 2046   venlafaxine  XR (EFFEXOR -XR) 24 hr capsule 150 mg  150 mg Oral Q breakfast Tat, Myrtie Atkinson, MD         Discharge Medications: Allergies as of 05/28/2024       Reactions   Betadine [povidone Iodine] Hives   Penicillins Rash        Medication List     STOP taking these medications    baclofen  10 MG tablet Commonly known as: LIORESAL    dextromethorphan -guaiFENesin  30-600 MG 12hr tablet Commonly known as: MUCINEX  DM   predniSONE  20 MG tablet Commonly known as: DELTASONE        TAKE these medications    acetaminophen  325 MG  tablet Commonly known as: TYLENOL  Take 2 tablets (650 mg total) by mouth every 6 (six) hours as needed for mild pain (pain score 1-3) (or Fever >/= 101).   albuterol  108 (90 Base) MCG/ACT inhaler Commonly known as: VENTOLIN  HFA Inhale 2 puffs into the lungs every 4 (four) hours as needed for wheezing or shortness of breath.   aspirin  EC 81 MG tablet Take 81 mg by mouth daily. Swallow whole.   atorvastatin  40 MG tablet Commonly known as: LIPITOR Take 1 tablet (40 mg total) by mouth daily.   cefdinir 300 MG capsule Commonly known as: OMNICEF Take 1 capsule (300 mg total) by mouth 2 (two) times daily.   clopidogrel  75 MG tablet Commonly known as: PLAVIX  Take 1 tablet (75 mg total) by mouth daily.   clotrimazole 1 % cream Commonly known as: LOTRIMIN Apply 1 Application topically 2 (two) times daily.   cycloSPORINE  0.05 % ophthalmic emulsion Commonly known as: RESTASIS  Place 1 drop into both eyes 2 (two) times daily.   dextromethorphan  30 MG/5ML liquid Commonly known as: Delsym  Take 5 mLs (30 mg total) by mouth 2 (two) times daily as needed for cough.   fluticasone  furoate-vilanterol 200-25 MCG/ACT Aepb Commonly known as: Breo Ellipta  Inhale 1 puff into the lungs daily.   furosemide  20 MG tablet Commonly known as: Lasix  Take 1 tablet (20 mg total) by mouth daily.   Gemtesa 75 MG Tabs Generic drug: Vibegron Take 75 mg by mouth at bedtime.   ipratropium-albuterol  0.5-2.5 (3) MG/3ML Soln Commonly known as: DUONEB Take 3 mLs by nebulization every 4 (four) hours as needed (wheezing, shortness of breath).   levothyroxine  50 MCG tablet Commonly known as: SYNTHROID   Take 50 mcg by mouth daily.   metFORMIN  500 MG tablet Commonly known as: GLUCOPHAGE  Take 500 mg by mouth 2 (two) times daily.   multivitamins ther. w/minerals Tabs tablet Take 1 tablet by mouth every morning.   omeprazole 20 MG capsule Commonly known as: PRILOSEC Take 20 mg by mouth every morning.    polyethylene glycol 17 g packet Commonly known as: MIRALAX  / GLYCOLAX  Take 17 g by mouth daily as needed for mild constipation.   potassium chloride  10 MEQ tablet Commonly known as: KLOR-CON  Take 10 mEq by mouth daily.   pregabalin  150 MG capsule Commonly known as: LYRICA  Take 1 capsule (150 mg total) by mouth 2 (two) times daily.   rizatriptan  10 MG tablet Commonly known as: MAXALT  Take 10 mg by mouth every 2 (two) hours as needed for migraine. No more than 2 doses in 24 hours   senna 8.6 MG Tabs tablet Commonly known as: SENOKOT Take 2 tablets by mouth at bedtime.   traZODone  50 MG tablet Commonly known as: DESYREL  Take 1 tablet (50 mg total) by mouth at bedtime as needed for sleep.   Trintellix  10 MG Tabs tablet Generic drug: vortioxetine  HBr Take 10 mg by mouth daily.   umeclidinium bromide 62.5 MCG/ACT Aepb Commonly known as: INCRUSE ELLIPTA Inhale 1 puff into the lungs daily.   venlafaxine  XR 150 MG 24 hr capsule Commonly known as: EFFEXOR -XR Take 150 mg by mouth every morning.         Relevant Imaging Results:  Relevant Lab Results:   Additional Information SSN: [id number] LCSWA

## 2024-05-29 ENCOUNTER — Emergency Department (HOSPITAL_COMMUNITY)
Admission: EM | Admit: 2024-05-29 | Discharge: 2024-05-29 | Disposition: A | Discharge status: Skilled Nursing Facility | Attending: Emergency Medicine | Admitting: Emergency Medicine

## 2024-05-29 ENCOUNTER — Other Ambulatory Visit: Payer: Self-pay

## 2024-05-29 ENCOUNTER — Encounter (HOSPITAL_COMMUNITY): Payer: Self-pay | Admitting: *Deleted

## 2024-05-29 ENCOUNTER — Emergency Department (HOSPITAL_COMMUNITY)

## 2024-05-29 DIAGNOSIS — N3091 Cystitis, unspecified with hematuria: Secondary | ICD-10-CM

## 2024-05-29 DIAGNOSIS — Z7984 Long term (current) use of oral hypoglycemic drugs: Secondary | ICD-10-CM | POA: Insufficient documentation

## 2024-05-29 DIAGNOSIS — I517 Cardiomegaly: Secondary | ICD-10-CM | POA: Diagnosis not present

## 2024-05-29 DIAGNOSIS — E039 Hypothyroidism, unspecified: Secondary | ICD-10-CM | POA: Insufficient documentation

## 2024-05-29 DIAGNOSIS — K8689 Other specified diseases of pancreas: Secondary | ICD-10-CM | POA: Diagnosis not present

## 2024-05-29 DIAGNOSIS — A419 Sepsis, unspecified organism: Secondary | ICD-10-CM | POA: Diagnosis not present

## 2024-05-29 DIAGNOSIS — J9601 Acute respiratory failure with hypoxia: Secondary | ICD-10-CM | POA: Diagnosis not present

## 2024-05-29 DIAGNOSIS — Z7982 Long term (current) use of aspirin: Secondary | ICD-10-CM | POA: Insufficient documentation

## 2024-05-29 DIAGNOSIS — R58 Hemorrhage, not elsewhere classified: Secondary | ICD-10-CM | POA: Diagnosis not present

## 2024-05-29 DIAGNOSIS — I951 Orthostatic hypotension: Secondary | ICD-10-CM | POA: Diagnosis not present

## 2024-05-29 DIAGNOSIS — Z043 Encounter for examination and observation following other accident: Secondary | ICD-10-CM | POA: Diagnosis not present

## 2024-05-29 DIAGNOSIS — I771 Stricture of artery: Secondary | ICD-10-CM | POA: Diagnosis not present

## 2024-05-29 DIAGNOSIS — Z79899 Other long term (current) drug therapy: Secondary | ICD-10-CM | POA: Diagnosis not present

## 2024-05-29 DIAGNOSIS — N3080 Other cystitis without hematuria: Secondary | ICD-10-CM | POA: Diagnosis not present

## 2024-05-29 DIAGNOSIS — L89152 Pressure ulcer of sacral region, stage 2: Secondary | ICD-10-CM

## 2024-05-29 DIAGNOSIS — L8990 Pressure ulcer of unspecified site, unspecified stage: Secondary | ICD-10-CM | POA: Diagnosis not present

## 2024-05-29 DIAGNOSIS — Z7902 Long term (current) use of antithrombotics/antiplatelets: Secondary | ICD-10-CM | POA: Diagnosis not present

## 2024-05-29 DIAGNOSIS — R6889 Other general symptoms and signs: Secondary | ICD-10-CM | POA: Diagnosis not present

## 2024-05-29 DIAGNOSIS — E119 Type 2 diabetes mellitus without complications: Secondary | ICD-10-CM | POA: Diagnosis not present

## 2024-05-29 DIAGNOSIS — J441 Chronic obstructive pulmonary disease with (acute) exacerbation: Secondary | ICD-10-CM | POA: Diagnosis not present

## 2024-05-29 DIAGNOSIS — I10 Essential (primary) hypertension: Secondary | ICD-10-CM | POA: Diagnosis not present

## 2024-05-29 DIAGNOSIS — S0990XA Unspecified injury of head, initial encounter: Secondary | ICD-10-CM | POA: Diagnosis not present

## 2024-05-29 DIAGNOSIS — N3001 Acute cystitis with hematuria: Secondary | ICD-10-CM | POA: Diagnosis not present

## 2024-05-29 DIAGNOSIS — N939 Abnormal uterine and vaginal bleeding, unspecified: Secondary | ICD-10-CM | POA: Diagnosis present

## 2024-05-29 DIAGNOSIS — K5730 Diverticulosis of large intestine without perforation or abscess without bleeding: Secondary | ICD-10-CM | POA: Diagnosis not present

## 2024-05-29 DIAGNOSIS — L22 Diaper dermatitis: Secondary | ICD-10-CM

## 2024-05-29 DIAGNOSIS — E1142 Type 2 diabetes mellitus with diabetic polyneuropathy: Secondary | ICD-10-CM | POA: Diagnosis not present

## 2024-05-29 DIAGNOSIS — W19XXXA Unspecified fall, initial encounter: Secondary | ICD-10-CM | POA: Diagnosis not present

## 2024-05-29 DIAGNOSIS — Z8673 Personal history of transient ischemic attack (TIA), and cerebral infarction without residual deficits: Secondary | ICD-10-CM | POA: Diagnosis not present

## 2024-05-29 DIAGNOSIS — E782 Mixed hyperlipidemia: Secondary | ICD-10-CM | POA: Diagnosis not present

## 2024-05-29 DIAGNOSIS — I700 Atherosclerosis of aorta: Secondary | ICD-10-CM | POA: Diagnosis not present

## 2024-05-29 DIAGNOSIS — I69398 Other sequelae of cerebral infarction: Secondary | ICD-10-CM | POA: Diagnosis not present

## 2024-05-29 DIAGNOSIS — R935 Abnormal findings on diagnostic imaging of other abdominal regions, including retroperitoneum: Secondary | ICD-10-CM | POA: Diagnosis not present

## 2024-05-29 DIAGNOSIS — D649 Anemia, unspecified: Secondary | ICD-10-CM

## 2024-05-29 DIAGNOSIS — J9621 Acute and chronic respiratory failure with hypoxia: Secondary | ICD-10-CM | POA: Diagnosis not present

## 2024-05-29 DIAGNOSIS — J449 Chronic obstructive pulmonary disease, unspecified: Secondary | ICD-10-CM | POA: Diagnosis not present

## 2024-05-29 DIAGNOSIS — I959 Hypotension, unspecified: Secondary | ICD-10-CM | POA: Diagnosis not present

## 2024-05-29 HISTORY — DX: Cerebral infarction, unspecified: I63.9

## 2024-05-29 HISTORY — DX: Unspecified protein-calorie malnutrition: E46

## 2024-05-29 HISTORY — DX: Anemia, unspecified: D64.9

## 2024-05-29 HISTORY — DX: Metabolic encephalopathy: G93.41

## 2024-05-29 LAB — COMPREHENSIVE METABOLIC PANEL WITH GFR
ALT: 76 U/L — ABNORMAL HIGH (ref 0–44)
AST: 28 U/L (ref 15–41)
Albumin: 2.6 g/dL — ABNORMAL LOW (ref 3.5–5.0)
Alkaline Phosphatase: 41 U/L (ref 38–126)
Anion gap: 9 (ref 5–15)
BUN: 17 mg/dL (ref 8–23)
CO2: 26 mmol/L (ref 22–32)
Calcium: 8.4 mg/dL — ABNORMAL LOW (ref 8.9–10.3)
Chloride: 99 mmol/L (ref 98–111)
Creatinine, Ser: 0.78 mg/dL (ref 0.44–1.00)
GFR, Estimated: >60 mL/min (ref >60)
Glucose, Bld: 171 mg/dL — ABNORMAL HIGH (ref 70–99)
Potassium: 3.6 mmol/L (ref 3.5–5.1)
Sodium: 134 mmol/L — ABNORMAL LOW (ref 135–145)
Total Bilirubin: 0.9 mg/dL (ref 0.0–1.2)
Total Protein: 5.3 g/dL — ABNORMAL LOW (ref 6.5–8.1)

## 2024-05-29 LAB — CBC WITH DIFFERENTIAL/PLATELET
Abs Immature Granulocytes: 0.26 10*3/uL — ABNORMAL HIGH (ref 0.00–0.07)
Basophils Absolute: 0 10*3/uL (ref 0.0–0.1)
Basophils Relative: 0 %
Eosinophils Absolute: 0.2 10*3/uL (ref 0.0–0.5)
Eosinophils Relative: 1 %
HCT: 30.9 % — ABNORMAL LOW (ref 36.0–46.0)
Hemoglobin: 10.2 g/dL — ABNORMAL LOW (ref 12.0–15.0)
Immature Granulocytes: 2 %
Lymphocytes Relative: 26 %
Lymphs Abs: 4.2 10*3/uL — ABNORMAL HIGH (ref 0.7–4.0)
MCH: 30.5 pg (ref 26.0–34.0)
MCHC: 33 g/dL (ref 30.0–36.0)
MCV: 92.5 fL (ref 80.0–100.0)
Monocytes Absolute: 0.8 10*3/uL (ref 0.1–1.0)
Monocytes Relative: 5 %
Neutro Abs: 10.9 10*3/uL — ABNORMAL HIGH (ref 1.7–7.7)
Neutrophils Relative %: 66 %
Platelets: 227 10*3/uL (ref 150–400)
RBC: 3.34 MIL/uL — ABNORMAL LOW (ref 3.87–5.11)
RDW: 17.7 % — ABNORMAL HIGH (ref 11.5–15.5)
WBC: 16.4 10*3/uL — ABNORMAL HIGH (ref 4.0–10.5)
nRBC: 0.1 % (ref 0.0–0.2)

## 2024-05-29 LAB — URINALYSIS, ROUTINE W REFLEX MICROSCOPIC
Bacteria, UA: NONE SEEN
Bilirubin Urine: NEGATIVE
Glucose, UA: 50 mg/dL — AB
Ketones, ur: NEGATIVE mg/dL
Nitrite: NEGATIVE
Protein, ur: NEGATIVE mg/dL
RBC / HPF: >50 RBC/hpf (ref 0–5)
Specific Gravity, Urine: 1.015 (ref 1.005–1.030)
pH: 5 (ref 5.0–8.0)

## 2024-05-29 LAB — PROTIME-INR
INR: 1.1 (ref 0.8–1.2)
Prothrombin Time: 13.9 s (ref 11.4–15.2)

## 2024-05-29 LAB — MAGNESIUM: Magnesium: 1.6 mg/dL — ABNORMAL LOW (ref 1.7–2.4)

## 2024-05-29 LAB — TROPONIN I (HIGH SENSITIVITY)
Troponin I (High Sensitivity): 17 ng/L (ref <18)
Troponin I (High Sensitivity): 15 ng/L (ref <18)

## 2024-05-29 LAB — TYPE AND SCREEN
ABO/RH(D): A POS
Antibody Screen: NEGATIVE

## 2024-05-29 MED ORDER — IOHEXOL 300 MG/ML  SOLN
100.0000 mL | Freq: Once | INTRAMUSCULAR | Status: AC | PRN
  Administered 2024-05-29: 100 mL via INTRAVENOUS

## 2024-05-29 MED ORDER — NITROFURANTOIN MONOHYD MACRO 100 MG PO CAPS: 100.0000 mg | ORAL_CAPSULE | Freq: Two times a day (BID) | ORAL | 0 refills | Status: DC

## 2024-05-29 MED ORDER — MIDODRINE HCL 10 MG PO TABS
10.0000 mg | ORAL_TABLET | Freq: Three times a day (TID) | ORAL | 0 refills | Status: AC
Start: 2024-05-29 — End: 2024-06-28

## 2024-05-29 MED ORDER — FLUCONAZOLE 150 MG PO TABS
150.0000 mg | ORAL_TABLET | Freq: Once | ORAL | Status: AC
  Administered 2024-05-29: 150 mg via ORAL
  Filled 2024-05-29: qty 1

## 2024-05-29 MED ORDER — SODIUM CHLORIDE 0.9 % IV BOLUS
1000.0000 mL | Freq: Once | INTRAVENOUS | Status: AC
Start: 2024-05-29 — End: 2024-05-29
  Administered 2024-05-29: 1000 mL via INTRAVENOUS

## 2024-05-29 MED ORDER — FLUCONAZOLE 150 MG PO TABS: 150.0000 mg | ORAL_TABLET | Freq: Once | ORAL | 0 refills | Status: AC

## 2024-05-29 MED ORDER — MIDODRINE HCL 5 MG PO TABS
10.0000 mg | ORAL_TABLET | Freq: Once | ORAL | Status: AC
  Administered 2024-05-29: 10 mg via ORAL
  Filled 2024-05-29: qty 2

## 2024-05-29 MED ORDER — MAGNESIUM SULFATE 2 GM/50ML IV SOLN
2.0000 g | Freq: Once | INTRAVENOUS | Status: AC
  Administered 2024-05-29: 2 g via INTRAVENOUS
  Filled 2024-05-29: qty 50

## 2024-05-29 MED ORDER — FLUCONAZOLE 150 MG PO TABS: 150.0000 mg | ORAL_TABLET | Freq: Once | ORAL | 0 refills | Status: DC

## 2024-05-29 MED ORDER — NITROFURANTOIN MONOHYD MACRO 100 MG PO CAPS: 100.0000 mg | ORAL_CAPSULE | Freq: Two times a day (BID) | ORAL | Status: DC

## 2024-05-29 NOTE — ED Triage Notes (Signed)
 Pt brought in by [facility] from [facility] with c/o vaginal bleeding. She has noticed blood when she wiped over the last few days, but today at 1400 she stated having a consistent flow of bright red blood. Facility staff reported to EMS that they saw large clots of blood. EMS reports pt would not allow them to assess the bleeding. BP 90/60, HR 92, O2 sat 97% on RA for EMS.

## 2024-05-29 NOTE — Discharge Instructions (Addendum)
 Give the Diflucan to patient after she is done with her Macrobid.

## 2024-05-29 NOTE — ED Notes (Signed)
 Discharge instructions explained to family at bedside. Facility called to update on pt discharge and for transport. Facility to send transport to pick up pt.

## 2024-05-29 NOTE — ED Notes (Signed)
 Sacral pad placed on pts sacral region. Previous sacral pad removed to assess pressure ulcers

## 2024-05-29 NOTE — ED Notes (Signed)
 EDP made aware of hypotension and either vaginal or rectal hemorrhaging. Pt also had a fall today in which she hit her head, from [facility], but refused EMS transport. Denies LOC or use of blood thinners

## 2024-05-29 NOTE — ED Provider Notes (Signed)
 [name] AT [facility] Provider Note   CSN: [id number] Arrival date & time: 05/29/24  1454     History  Chief Complaint  Patient presents with   Vaginal Bleeding    [name] White is a 83 y.o. female.  Pt is a 83 yo female with pmhx significant for hypothyroidism, hld, DM2, HTN, hx pituitary adenoma, CVA, and anemia.  Pt was admitted to the hospital with sepsis from pna and uti.  She was d/c yesterday.  Abx completed in the hospital.  Urine did grow VRE and was sensitive to Linezolid  which she did take.  She has a hx of hypotension and was supposed to be d/c with midodrine , but I don't see it on her discharge medication list and it's not on her MAR.  She is not on blood thinners, but is on plavix  and asa.  Pt noticed some blood when she wiped for the past few days.  She is not sure if it is coming from her vagina or urethra.  She did fall today, but said she did not hurt herself.  The only thing that hurts is a sacral decub.  Pt said she felt dizzy before she fell.                Home Medications Prior to Admission medications   Medication Sig Start Date End Date Taking? Authorizing Provider  acetaminophen  (TYLENOL ) 325 MG tablet Take 2 tablets (650 mg total) by mouth every 6 (six) hours as needed for mild pain (pain score 1-3) (or Fever >/= 101). 01/01/24  Yes Shahmehdi, Constantino Demark, MD  albuterol  (VENTOLIN  HFA) 108 (90 Base) MCG/ACT inhaler Inhale 2 puffs into the lungs every 4 (four) hours as needed for wheezing or shortness of breath. 05/01/24  Yes Johnson, Clanford L, MD  aspirin  EC 81 MG tablet Take 81 mg by mouth daily. Swallow whole.   Yes [provider]  atorvastatin  (LIPITOR) 40 MG tablet Take 1 tablet (40 mg total) by mouth daily. 03/20/24  Yes Emokpae, Courage, MD  cefdinir (OMNICEF) 300 MG capsule Take 1 capsule (300 mg total) by mouth 2 (two) times daily. 05/28/24  Yes Tat, Myrtie Atkinson, MD  clopidogrel  (PLAVIX ) 75 MG tablet Take 1 tablet (75  mg total) by mouth daily. 05/15/24  Yes Johnson, Clanford L, MD  clotrimazole (LOTRIMIN) 1 % cream Apply 1 Application topically 2 (two) times daily. 05/21/24  Yes [provider]  cycloSPORINE  (RESTASIS ) 0.05 % ophthalmic emulsion Place 1 drop into both eyes 2 (two) times daily.   Yes [provider]  dextromethorphan  (DELSYM ) 30 MG/5ML liquid Take 5 mLs (30 mg total) by mouth 2 (two) times daily as needed for cough. 05/01/24  Yes Johnson, Clanford L, MD  fluticasone  furoate-vilanterol (BREO ELLIPTA ) 200-25 MCG/ACT AEPB Inhale 1 puff into the lungs daily. 03/19/24  Yes Emokpae, Courage, MD  furosemide  (LASIX ) 20 MG tablet Take 1 tablet (20 mg total) by mouth daily. 05/03/24 05/03/25 Yes Johnson, Clanford L, MD  ipratropium-albuterol  (DUONEB) 0.5-2.5 (3) MG/3ML SOLN Take 3 mLs by nebulization every 4 (four) hours as needed (wheezing, shortness of breath). 05/01/24  Yes Johnson, Clanford L, MD  levothyroxine  (SYNTHROID ) 50 MCG tablet Take 50 mcg by mouth daily.   Yes [provider]  metFORMIN  (GLUCOPHAGE ) 500 MG tablet Take 500 mg by mouth 2 (two) times daily. 04/16/24  Yes [provider]  midodrine  (PROAMATINE ) 10 MG tablet Take 1 tablet (10 mg total) by mouth 3 (three) times  daily. 05/29/24 06/28/24 Yes Sueellen Emery, MD  Multiple Vitamins-Minerals (THERA-M) TABS Take 1 tablet by mouth daily.   Yes [provider]  nitrofurantoin, macrocrystal-monohydrate, (MACROBID) 100 MG capsule Take 1 capsule (100 mg total) by mouth 2 (two) times daily. 05/29/24  Yes Klarisa Barman, MD  omeprazole (PRILOSEC) 20 MG capsule Take 20 mg by mouth every morning.   Yes [provider]  polyethylene glycol (MIRALAX  / GLYCOLAX ) 17 g packet Take 17 g by mouth daily as needed for mild constipation. 05/01/24  Yes Johnson, Clanford L, MD  potassium chloride  (KLOR-CON ) 10 MEQ tablet Take 10 mEq by mouth daily. 05/15/24  Yes [provider]  pregabalin  (LYRICA ) 150 MG capsule Take  1 capsule (150 mg total) by mouth 2 (two) times daily. 03/19/24  Yes Colin Dawley, MD  rizatriptan  (MAXALT ) 10 MG tablet Take 10 mg by mouth every 2 (two) hours as needed for migraine. No more than 2 doses in 24 hours 04/16/24  Yes [provider]  senna (SENOKOT) 8.6 MG TABS tablet Take 2 tablets by mouth at bedtime.   Yes [provider]  traZODone  (DESYREL ) 50 MG tablet Take 1 tablet (50 mg total) by mouth at bedtime as needed for sleep. 03/19/24  Yes Emokpae, Courage, MD  TRINTELLIX  10 MG TABS tablet Take 10 mg by mouth daily. 03/09/24  Yes [provider]  umeclidinium bromide (INCRUSE ELLIPTA) 62.5 MCG/ACT AEPB Inhale 1 puff into the lungs daily. 05/27/24  Yes Tat, Myrtie Atkinson, MD  venlafaxine  (EFFEXOR -XR) 150 MG 24 hr capsule Take 150 mg by mouth every morning.   Yes [provider]  Vibegron (GEMTESA) 75 MG TABS Take 75 mg by mouth at bedtime.   Yes [provider]  fluconazole (DIFLUCAN) 150 MG tablet Take 1 tablet (150 mg total) by mouth once for 1 dose. 06/05/24 06/05/24  Sueellen Emery, MD      Allergies    Betadine [povidone iodine] and Penicillins    Review of Systems   Review of Systems  Genitourinary:  Positive for vaginal bleeding.  All other systems reviewed and are negative.   Physical Exam Updated Vital Signs BP 102/63   Pulse 84   Temp 97.9 F (36.6 C) (Oral)   Resp 19   Ht 5\' 5"  (1.651 m)   Wt 58.7 kg   SpO2 96%   BMI 21.53 kg/m  Physical Exam Vitals and nursing note reviewed.  Constitutional:      Appearance: Normal appearance.  HENT:     Head: Normocephalic and atraumatic.     Right Ear: External ear normal.     Left Ear: External ear normal.     Nose: Nose normal.     Mouth/Throat:     Mouth: Mucous membranes are moist.     Pharynx: Oropharynx is clear.  Eyes:     Extraocular Movements: Extraocular movements intact.     Conjunctiva/sclera: Conjunctivae normal.     Pupils: Pupils are equal, round, and reactive  to light.  Cardiovascular:     Rate and Rhythm: Normal rate and regular rhythm.     Pulses: Normal pulses.     Heart sounds: Normal heart sounds.  Pulmonary:     Effort: Pulmonary effort is normal.     Breath sounds: Normal breath sounds.  Abdominal:     General: Abdomen is flat. Bowel sounds are normal.     Palpations: Abdomen is soft.  Genitourinary:    Comments: Bleeding looks to be coming from urethra Musculoskeletal:  General: Normal range of motion.     Cervical back: Normal range of motion and neck supple.  Skin:    Capillary Refill: Capillary refill takes less than 2 seconds.     Comments: Stage 2 sacral decub  Fungal rash to bottom  Neurological:     General: No focal deficit present.     Mental Status: She is alert and oriented to person, place, and time.  Psychiatric:        Mood and Affect: Mood normal.        Behavior: Behavior normal.     ED Results / Procedures / Treatments   Labs (all labs ordered are listed, but only abnormal results are displayed) Labs Reviewed  CBC WITH DIFFERENTIAL/PLATELET - Abnormal; Notable for the following components:      Result Value   WBC 16.4 (*)    RBC 3.34 (*)    Hemoglobin 10.2 (*)    HCT 30.9 (*)    RDW 17.7 (*)    Neutro Abs 10.9 (*)    Lymphs Abs 4.2 (*)    Abs Immature Granulocytes 0.26 (*)    All other components within normal limits  COMPREHENSIVE METABOLIC PANEL WITH GFR - Abnormal; Notable for the following components:   Sodium 134 (*)    Glucose, Bld 171 (*)    Calcium  8.4 (*)    Total Protein 5.3 (*)    Albumin 2.6 (*)    ALT 76 (*)    All other components within normal limits  URINALYSIS, ROUTINE W REFLEX MICROSCOPIC - Abnormal; Notable for the following components:   APPearance HAZY (*)    Glucose, UA 50 (*)    Hgb urine dipstick LARGE (*)    Leukocytes,Ua SMALL (*)    All other components within normal limits  MAGNESIUM  - Abnormal; Notable for the following components:   Magnesium  1.6 (*)     All other components within normal limits  PROTIME-INR  TYPE AND SCREEN  TROPONIN I (HIGH SENSITIVITY)  TROPONIN I (HIGH SENSITIVITY)    EKG None  Radiology CT ABDOMEN PELVIS W CONTRAST Result Date: 05/29/2024 CLINICAL DATA:  Abdominal and flank pain with hematuria. Vaginal bleeding. EXAM: CT ABDOMEN AND PELVIS WITH CONTRAST TECHNIQUE: Multidetector CT imaging of the abdomen and pelvis was performed using the standard protocol following bolus administration of intravenous contrast. RADIATION DOSE REDUCTION: This exam was performed according to the departmental dose-optimization program which includes automated exposure control, adjustment of the mA and/or kV according to patient size and/or use of iterative reconstruction technique. CONTRAST:  OMNIPAQUE  IOHEXOL  300 MG/ML  SOLN COMPARISON:  CT chest abdomen and pelvis 12/25/2023. chest CT 05/22/2024. FINDINGS: Lower chest: There is atelectasis in the lung bases. There are trace bilateral pleural effusions. Hepatobiliary: No focal liver abnormality is seen. Status post cholecystectomy. No biliary dilatation. Pancreas: There are calcifications in the pancreatic head, unchanged. Diffuse atrophy is again seen. No pancreatic ductal dilatation or surrounding inflammatory changes. Spleen: Normal in size without focal abnormality. Adrenals/Urinary Tract: There are rounded hypodensities in both kidneys which are too small characterize, likely cysts. There is no hydronephrosis or perinephric fat stranding. There is an indeterminate left adrenal nodule measuring 12 mm, unchanged. Right adrenal gland is within normal limits. The bladder is within normal limits. Stomach/Bowel: Is the colon is mildly diffusely dilated containing air-fluid levels. No abrupt transition visualized, but the sigmoid colon appears decompressed. There is sigmoid colon diverticulosis. The appendix is not seen. Small bowel and stomach are within normal  limits. There surgical changes at  the gastroesophageal junction. Vascular/Lymphatic: Aortic atherosclerosis. No enlarged abdominal or pelvic lymph nodes. Reproductive: Status post hysterectomy. No adnexal masses. Other: There is no ascites. Small fat containing ventral hernia superior to the level of the umbilicus. There are areas of subcutaneous stranding in the upper left anterior abdominal wall and across the lower anterior abdominal wall. Musculoskeletal: There is mild compression deformity of the superior endplate of T12 worrisome for acute fracture. IMPRESSION: 1. Mild diffuse dilatation of the colon containing air-fluid levels. No abrupt transition visualized. Findings may represent ileus or partial obstruction. 2. Acute/new mild compression deformity of the superior endplate of T12. 3. Trace bilateral pleural effusions. 4. Stable indeterminate left adrenal nodule. Recommend further evaluation with adrenal protocol CT or MRI. 5. Aortic atherosclerosis. Aortic Atherosclerosis (ICD10-I70.0). Electronically Signed   By: Tyron Gallon M.D.   On: 05/29/2024 17:54   DG Chest Portable 1 View Result Date: 05/29/2024 CLINICAL DATA:  fall EXAM: PORTABLE CHEST - 1 VIEW COMPARISON:  May 28, 25 FINDINGS: Lower lung volumes with elevation of the right hemidiaphragm. No focal airspace consolidation, pleural effusion, or pneumothorax. Mild cardiomegaly. Tortuous aorta with aortic atherosclerosis. No acute fracture or destructive lesion. Osteopenia. Bilateral AC joint osteoarthritis. Multilevel thoracic osteophytosis. IMPRESSION: No acute cardiopulmonary abnormality. Electronically Signed   By: Rance Burrows M.D.   On: 05/29/2024 16:41    Procedures Procedures    Medications Ordered in ED Medications  nitrofurantoin (macrocrystal-monohydrate) (MACROBID) capsule 100 mg (has no administration in time range)  sodium chloride  0.9 % bolus 1,000 mL (0 mLs Intravenous Stopped 05/29/24 1850)  midodrine  (PROAMATINE ) tablet 10 mg (10 mg Oral Given 05/29/24  1706)  magnesium  sulfate IVPB 2 g 50 mL (0 g Intravenous Stopped 05/29/24 1850)  iohexol  (OMNIPAQUE ) 300 MG/ML solution 100 mL (100 mLs Intravenous Contrast Given 05/29/24 1729)  fluconazole (DIFLUCAN) tablet 150 mg (150 mg Oral Given 05/29/24 1850)    ED Course/ Medical Decision Making/ A&P                                 Medical Decision Making Amount and/or Complexity of Data Reviewed Labs: ordered. Radiology: ordered.  Risk Prescription drug management.   This patient presents to the ED for concern of bleeding, this involves an extensive number of treatment options, and is a complaint that carries with it a high risk of complications and morbidity.  The differential diagnosis includes vagina, rectal, urethral   Co morbidities that complicate the patient evaluation  hypothyroidism, hld, DM2, HTN, hx pituitary adenoma, CVA, and anemia   Additional history obtained:  Additional history obtained from epic chart review External records from outside source obtained and reviewed including EMS Report   Lab Tests:  I Ordered, and personally interpreted labs.  The pertinent results include:  cbc with hgb 10.2 (stable from yesterday when it was 10.3), but it was 13.5 on 5/30; urine with >50 RBCs, WBCs 21-50 with yeast; mg low at 1.6   Imaging Studies ordered:  I ordered imaging studies including cxr  I independently visualized and interpreted imaging which showed No acute cardiopulmonary abnormality.  I agree with the radiologist interpretation   Cardiac Monitoring:  The patient was maintained on a cardiac monitor.  I personally viewed and interpreted the cardiac monitored which showed an underlying rhythm of: nsr   Medicines ordered and prescription drug management:  I ordered medication including ivfs  for hypotension  Reevaluation of the patient after these medicines showed that the patient improved I have reviewed the patients home medicines and have made adjustments as  needed   Test Considered:  ct   Critical Interventions:  Abx, fluids    Problem List / ED Course:  Hypotension:  improved with IVFs and midodrine .  Pt is supposed to be on midodrine , but it was not rx'd.  I will send her with rx.  BP has improved.  She feels well and is able to ambulate without dizziness. UTI:  pt still has evidence of infection.  I think this is the cause of her hemorrhagic cystitis.  Pt is not septic.  Pt did finish linezolid .  Urine also sensitive to macrobid, so I am going to put her on this. Urology referral given. Yeast in urine:  diflucan given Abn ct: questionable early sbo on ct.  No sx of obstruction.  Pt has been having good bowel movements since being put on miralax  in the hospital.  She is not having n/v or abd pain T12 compression fx:  new from prior ct, but not tender on exam.  She does not want a brace Anemia:  stable from the last 5 days.  Hypomagnesemia:  2g mg iv given   Reevaluation:  After the interventions noted above, I reevaluated the patient and found that they have :improved   Social Determinants of Health:  Lives in a facility   Dispostion:  After consideration of the diagnostic results and the patients response to treatment, I feel that the patent would benefit from discharge with outpatient f/u.          Final Clinical Impression(s) / ED Diagnoses Final diagnoses:  Hemorrhagic cystitis  Hypomagnesemia  Orthostatic hypotension  Pressure injury of sacral region, stage 2 (HCC)  Diaper dermatitis  Anemia, unspecified type    Rx / DC Orders ED Discharge Orders          Ordered    fluconazole (DIFLUCAN) 150 MG tablet   Once,   Status:  Discontinued        05/29/24 1954    fluconazole (DIFLUCAN) 150 MG tablet   Once        05/29/24 1955    Ambulatory referral to Urology        05/29/24 1953    nitrofurantoin, macrocrystal-monohydrate, (MACROBID) 100 MG capsule  2 times daily        05/29/24 1954    midodrine   (PROAMATINE ) 10 MG tablet  3 times daily        05/29/24 1954              Sueellen Emery, MD 05/29/24 2002

## 2024-05-29 NOTE — ED Notes (Signed)
 ED Provider at bedside.

## 2024-05-29 NOTE — ED Notes (Signed)
 Pt ambulated using a walker with staff present. Pt in NAD.

## 2024-05-30 DIAGNOSIS — I69398 Other sequelae of cerebral infarction: Secondary | ICD-10-CM | POA: Diagnosis not present

## 2024-05-30 DIAGNOSIS — E1142 Type 2 diabetes mellitus with diabetic polyneuropathy: Secondary | ICD-10-CM | POA: Diagnosis not present

## 2024-05-30 DIAGNOSIS — L89152 Pressure ulcer of sacral region, stage 2: Secondary | ICD-10-CM | POA: Diagnosis not present

## 2024-05-30 DIAGNOSIS — I700 Atherosclerosis of aorta: Secondary | ICD-10-CM | POA: Diagnosis not present

## 2024-05-30 DIAGNOSIS — J441 Chronic obstructive pulmonary disease with (acute) exacerbation: Secondary | ICD-10-CM | POA: Diagnosis not present

## 2024-05-30 DIAGNOSIS — J9621 Acute and chronic respiratory failure with hypoxia: Secondary | ICD-10-CM | POA: Diagnosis not present

## 2024-05-31 DIAGNOSIS — I69398 Other sequelae of cerebral infarction: Secondary | ICD-10-CM | POA: Diagnosis not present

## 2024-05-31 DIAGNOSIS — J9621 Acute and chronic respiratory failure with hypoxia: Secondary | ICD-10-CM | POA: Diagnosis not present

## 2024-05-31 DIAGNOSIS — L89152 Pressure ulcer of sacral region, stage 2: Secondary | ICD-10-CM | POA: Diagnosis not present

## 2024-05-31 DIAGNOSIS — E1142 Type 2 diabetes mellitus with diabetic polyneuropathy: Secondary | ICD-10-CM | POA: Diagnosis not present

## 2024-05-31 DIAGNOSIS — I700 Atherosclerosis of aorta: Secondary | ICD-10-CM | POA: Diagnosis not present

## 2024-05-31 DIAGNOSIS — J441 Chronic obstructive pulmonary disease with (acute) exacerbation: Secondary | ICD-10-CM | POA: Diagnosis not present

## 2024-06-03 DIAGNOSIS — I69398 Other sequelae of cerebral infarction: Secondary | ICD-10-CM | POA: Diagnosis not present

## 2024-06-03 DIAGNOSIS — I700 Atherosclerosis of aorta: Secondary | ICD-10-CM | POA: Diagnosis not present

## 2024-06-03 DIAGNOSIS — L89152 Pressure ulcer of sacral region, stage 2: Secondary | ICD-10-CM | POA: Diagnosis not present

## 2024-06-03 DIAGNOSIS — E1142 Type 2 diabetes mellitus with diabetic polyneuropathy: Secondary | ICD-10-CM | POA: Diagnosis not present

## 2024-06-03 DIAGNOSIS — J9621 Acute and chronic respiratory failure with hypoxia: Secondary | ICD-10-CM | POA: Diagnosis not present

## 2024-06-03 DIAGNOSIS — J441 Chronic obstructive pulmonary disease with (acute) exacerbation: Secondary | ICD-10-CM | POA: Diagnosis not present

## 2024-06-04 DIAGNOSIS — E1142 Type 2 diabetes mellitus with diabetic polyneuropathy: Secondary | ICD-10-CM | POA: Diagnosis not present

## 2024-06-04 DIAGNOSIS — I700 Atherosclerosis of aorta: Secondary | ICD-10-CM | POA: Diagnosis not present

## 2024-06-04 DIAGNOSIS — I69398 Other sequelae of cerebral infarction: Secondary | ICD-10-CM | POA: Diagnosis not present

## 2024-06-04 DIAGNOSIS — L89152 Pressure ulcer of sacral region, stage 2: Secondary | ICD-10-CM | POA: Diagnosis not present

## 2024-06-04 DIAGNOSIS — J441 Chronic obstructive pulmonary disease with (acute) exacerbation: Secondary | ICD-10-CM | POA: Diagnosis not present

## 2024-06-04 DIAGNOSIS — J9621 Acute and chronic respiratory failure with hypoxia: Secondary | ICD-10-CM | POA: Diagnosis not present

## 2024-06-05 ENCOUNTER — Inpatient Hospital Stay: Admitting: Neurology

## 2024-06-05 DIAGNOSIS — Z09 Encounter for follow-up examination after completed treatment for conditions other than malignant neoplasm: Secondary | ICD-10-CM | POA: Diagnosis not present

## 2024-06-05 DIAGNOSIS — I959 Hypotension, unspecified: Secondary | ICD-10-CM | POA: Diagnosis not present

## 2024-06-05 DIAGNOSIS — N390 Urinary tract infection, site not specified: Secondary | ICD-10-CM | POA: Diagnosis not present

## 2024-06-06 DIAGNOSIS — L89152 Pressure ulcer of sacral region, stage 2: Secondary | ICD-10-CM | POA: Diagnosis not present

## 2024-06-06 DIAGNOSIS — J441 Chronic obstructive pulmonary disease with (acute) exacerbation: Secondary | ICD-10-CM | POA: Diagnosis not present

## 2024-06-06 DIAGNOSIS — I700 Atherosclerosis of aorta: Secondary | ICD-10-CM | POA: Diagnosis not present

## 2024-06-06 DIAGNOSIS — I69398 Other sequelae of cerebral infarction: Secondary | ICD-10-CM | POA: Diagnosis not present

## 2024-06-06 DIAGNOSIS — J9621 Acute and chronic respiratory failure with hypoxia: Secondary | ICD-10-CM | POA: Diagnosis not present

## 2024-06-06 DIAGNOSIS — E1142 Type 2 diabetes mellitus with diabetic polyneuropathy: Secondary | ICD-10-CM | POA: Diagnosis not present

## 2024-06-10 DIAGNOSIS — J9621 Acute and chronic respiratory failure with hypoxia: Secondary | ICD-10-CM | POA: Diagnosis not present

## 2024-06-10 DIAGNOSIS — L89152 Pressure ulcer of sacral region, stage 2: Secondary | ICD-10-CM | POA: Diagnosis not present

## 2024-06-10 DIAGNOSIS — I700 Atherosclerosis of aorta: Secondary | ICD-10-CM | POA: Diagnosis not present

## 2024-06-10 DIAGNOSIS — J441 Chronic obstructive pulmonary disease with (acute) exacerbation: Secondary | ICD-10-CM | POA: Diagnosis not present

## 2024-06-10 DIAGNOSIS — I69398 Other sequelae of cerebral infarction: Secondary | ICD-10-CM | POA: Diagnosis not present

## 2024-06-10 DIAGNOSIS — E1142 Type 2 diabetes mellitus with diabetic polyneuropathy: Secondary | ICD-10-CM | POA: Diagnosis not present

## 2024-06-11 DIAGNOSIS — E1142 Type 2 diabetes mellitus with diabetic polyneuropathy: Secondary | ICD-10-CM | POA: Diagnosis not present

## 2024-06-11 DIAGNOSIS — J9621 Acute and chronic respiratory failure with hypoxia: Secondary | ICD-10-CM | POA: Diagnosis not present

## 2024-06-11 DIAGNOSIS — L89152 Pressure ulcer of sacral region, stage 2: Secondary | ICD-10-CM | POA: Diagnosis not present

## 2024-06-11 DIAGNOSIS — J441 Chronic obstructive pulmonary disease with (acute) exacerbation: Secondary | ICD-10-CM | POA: Diagnosis not present

## 2024-06-11 DIAGNOSIS — I69398 Other sequelae of cerebral infarction: Secondary | ICD-10-CM | POA: Diagnosis not present

## 2024-06-11 DIAGNOSIS — I700 Atherosclerosis of aorta: Secondary | ICD-10-CM | POA: Diagnosis not present

## 2024-06-13 DIAGNOSIS — I700 Atherosclerosis of aorta: Secondary | ICD-10-CM | POA: Diagnosis not present

## 2024-06-13 DIAGNOSIS — L89152 Pressure ulcer of sacral region, stage 2: Secondary | ICD-10-CM | POA: Diagnosis not present

## 2024-06-13 DIAGNOSIS — J441 Chronic obstructive pulmonary disease with (acute) exacerbation: Secondary | ICD-10-CM | POA: Diagnosis not present

## 2024-06-13 DIAGNOSIS — J9621 Acute and chronic respiratory failure with hypoxia: Secondary | ICD-10-CM | POA: Diagnosis not present

## 2024-06-13 DIAGNOSIS — E1142 Type 2 diabetes mellitus with diabetic polyneuropathy: Secondary | ICD-10-CM | POA: Diagnosis not present

## 2024-06-13 DIAGNOSIS — I69398 Other sequelae of cerebral infarction: Secondary | ICD-10-CM | POA: Diagnosis not present

## 2024-06-14 DIAGNOSIS — I69398 Other sequelae of cerebral infarction: Secondary | ICD-10-CM | POA: Diagnosis not present

## 2024-06-14 DIAGNOSIS — L89152 Pressure ulcer of sacral region, stage 2: Secondary | ICD-10-CM | POA: Diagnosis not present

## 2024-06-14 DIAGNOSIS — I700 Atherosclerosis of aorta: Secondary | ICD-10-CM | POA: Diagnosis not present

## 2024-06-14 DIAGNOSIS — E1142 Type 2 diabetes mellitus with diabetic polyneuropathy: Secondary | ICD-10-CM | POA: Diagnosis not present

## 2024-06-14 DIAGNOSIS — J441 Chronic obstructive pulmonary disease with (acute) exacerbation: Secondary | ICD-10-CM | POA: Diagnosis not present

## 2024-06-14 DIAGNOSIS — J9621 Acute and chronic respiratory failure with hypoxia: Secondary | ICD-10-CM | POA: Diagnosis not present

## 2024-06-17 DIAGNOSIS — J441 Chronic obstructive pulmonary disease with (acute) exacerbation: Secondary | ICD-10-CM | POA: Diagnosis not present

## 2024-06-17 DIAGNOSIS — I700 Atherosclerosis of aorta: Secondary | ICD-10-CM | POA: Diagnosis not present

## 2024-06-17 DIAGNOSIS — E1142 Type 2 diabetes mellitus with diabetic polyneuropathy: Secondary | ICD-10-CM | POA: Diagnosis not present

## 2024-06-17 DIAGNOSIS — J9621 Acute and chronic respiratory failure with hypoxia: Secondary | ICD-10-CM | POA: Diagnosis not present

## 2024-06-17 DIAGNOSIS — L89152 Pressure ulcer of sacral region, stage 2: Secondary | ICD-10-CM | POA: Diagnosis not present

## 2024-06-17 DIAGNOSIS — I69398 Other sequelae of cerebral infarction: Secondary | ICD-10-CM | POA: Diagnosis not present

## 2024-06-19 DIAGNOSIS — L89152 Pressure ulcer of sacral region, stage 2: Secondary | ICD-10-CM | POA: Diagnosis not present

## 2024-06-19 DIAGNOSIS — J9621 Acute and chronic respiratory failure with hypoxia: Secondary | ICD-10-CM | POA: Diagnosis not present

## 2024-06-19 DIAGNOSIS — I700 Atherosclerosis of aorta: Secondary | ICD-10-CM | POA: Diagnosis not present

## 2024-06-19 DIAGNOSIS — E1142 Type 2 diabetes mellitus with diabetic polyneuropathy: Secondary | ICD-10-CM | POA: Diagnosis not present

## 2024-06-19 DIAGNOSIS — I69398 Other sequelae of cerebral infarction: Secondary | ICD-10-CM | POA: Diagnosis not present

## 2024-06-19 DIAGNOSIS — J441 Chronic obstructive pulmonary disease with (acute) exacerbation: Secondary | ICD-10-CM | POA: Diagnosis not present

## 2024-06-20 ENCOUNTER — Telehealth: Payer: Self-pay

## 2024-06-20 DIAGNOSIS — I700 Atherosclerosis of aorta: Secondary | ICD-10-CM | POA: Diagnosis not present

## 2024-06-20 DIAGNOSIS — I69398 Other sequelae of cerebral infarction: Secondary | ICD-10-CM | POA: Diagnosis not present

## 2024-06-20 DIAGNOSIS — L89152 Pressure ulcer of sacral region, stage 2: Secondary | ICD-10-CM | POA: Diagnosis not present

## 2024-06-20 DIAGNOSIS — J441 Chronic obstructive pulmonary disease with (acute) exacerbation: Secondary | ICD-10-CM | POA: Diagnosis not present

## 2024-06-20 DIAGNOSIS — E1142 Type 2 diabetes mellitus with diabetic polyneuropathy: Secondary | ICD-10-CM | POA: Diagnosis not present

## 2024-06-20 DIAGNOSIS — J9621 Acute and chronic respiratory failure with hypoxia: Secondary | ICD-10-CM | POA: Diagnosis not present

## 2024-06-20 NOTE — Telephone Encounter (Signed)
 Up to date on meds, next review in July

## 2024-06-24 DIAGNOSIS — I700 Atherosclerosis of aorta: Secondary | ICD-10-CM | POA: Diagnosis not present

## 2024-06-24 DIAGNOSIS — J9621 Acute and chronic respiratory failure with hypoxia: Secondary | ICD-10-CM | POA: Diagnosis not present

## 2024-06-24 DIAGNOSIS — L89152 Pressure ulcer of sacral region, stage 2: Secondary | ICD-10-CM | POA: Diagnosis not present

## 2024-06-24 DIAGNOSIS — E1142 Type 2 diabetes mellitus with diabetic polyneuropathy: Secondary | ICD-10-CM | POA: Diagnosis not present

## 2024-06-24 DIAGNOSIS — I69398 Other sequelae of cerebral infarction: Secondary | ICD-10-CM | POA: Diagnosis not present

## 2024-06-24 DIAGNOSIS — J441 Chronic obstructive pulmonary disease with (acute) exacerbation: Secondary | ICD-10-CM | POA: Diagnosis not present

## 2024-06-25 DIAGNOSIS — N390 Urinary tract infection, site not specified: Secondary | ICD-10-CM | POA: Diagnosis not present

## 2024-06-25 DIAGNOSIS — E1142 Type 2 diabetes mellitus with diabetic polyneuropathy: Secondary | ICD-10-CM | POA: Diagnosis not present

## 2024-06-25 DIAGNOSIS — I700 Atherosclerosis of aorta: Secondary | ICD-10-CM | POA: Diagnosis not present

## 2024-06-25 DIAGNOSIS — J441 Chronic obstructive pulmonary disease with (acute) exacerbation: Secondary | ICD-10-CM | POA: Diagnosis not present

## 2024-06-25 DIAGNOSIS — I69398 Other sequelae of cerebral infarction: Secondary | ICD-10-CM | POA: Diagnosis not present

## 2024-06-25 DIAGNOSIS — J9621 Acute and chronic respiratory failure with hypoxia: Secondary | ICD-10-CM | POA: Diagnosis not present

## 2024-06-25 DIAGNOSIS — L89152 Pressure ulcer of sacral region, stage 2: Secondary | ICD-10-CM | POA: Diagnosis not present

## 2024-06-27 DIAGNOSIS — I700 Atherosclerosis of aorta: Secondary | ICD-10-CM | POA: Diagnosis not present

## 2024-06-27 DIAGNOSIS — E1142 Type 2 diabetes mellitus with diabetic polyneuropathy: Secondary | ICD-10-CM | POA: Diagnosis not present

## 2024-06-27 DIAGNOSIS — J9621 Acute and chronic respiratory failure with hypoxia: Secondary | ICD-10-CM | POA: Diagnosis not present

## 2024-06-27 DIAGNOSIS — I69398 Other sequelae of cerebral infarction: Secondary | ICD-10-CM | POA: Diagnosis not present

## 2024-06-27 DIAGNOSIS — J441 Chronic obstructive pulmonary disease with (acute) exacerbation: Secondary | ICD-10-CM | POA: Diagnosis not present

## 2024-06-27 DIAGNOSIS — L89152 Pressure ulcer of sacral region, stage 2: Secondary | ICD-10-CM | POA: Diagnosis not present

## 2024-07-01 DIAGNOSIS — E1142 Type 2 diabetes mellitus with diabetic polyneuropathy: Secondary | ICD-10-CM | POA: Diagnosis not present

## 2024-07-01 DIAGNOSIS — I700 Atherosclerosis of aorta: Secondary | ICD-10-CM | POA: Diagnosis not present

## 2024-07-01 DIAGNOSIS — I69398 Other sequelae of cerebral infarction: Secondary | ICD-10-CM | POA: Diagnosis not present

## 2024-07-01 DIAGNOSIS — J441 Chronic obstructive pulmonary disease with (acute) exacerbation: Secondary | ICD-10-CM | POA: Diagnosis not present

## 2024-07-01 DIAGNOSIS — J9621 Acute and chronic respiratory failure with hypoxia: Secondary | ICD-10-CM | POA: Diagnosis not present

## 2024-07-01 DIAGNOSIS — L89152 Pressure ulcer of sacral region, stage 2: Secondary | ICD-10-CM | POA: Diagnosis not present

## 2024-07-02 ENCOUNTER — Encounter: Payer: Self-pay | Admitting: Neurology

## 2024-07-02 ENCOUNTER — Ambulatory Visit (INDEPENDENT_AMBULATORY_CARE_PROVIDER_SITE_OTHER): Admitting: Neurology

## 2024-07-02 VITALS — BP 104/62 | HR 65

## 2024-07-02 DIAGNOSIS — L89152 Pressure ulcer of sacral region, stage 2: Secondary | ICD-10-CM | POA: Diagnosis not present

## 2024-07-02 DIAGNOSIS — I6930 Unspecified sequelae of cerebral infarction: Secondary | ICD-10-CM

## 2024-07-02 DIAGNOSIS — Z0001 Encounter for general adult medical examination with abnormal findings: Secondary | ICD-10-CM | POA: Diagnosis not present

## 2024-07-02 DIAGNOSIS — N390 Urinary tract infection, site not specified: Secondary | ICD-10-CM | POA: Diagnosis not present

## 2024-07-02 DIAGNOSIS — G9341 Metabolic encephalopathy: Secondary | ICD-10-CM

## 2024-07-02 DIAGNOSIS — G3184 Mild cognitive impairment, so stated: Secondary | ICD-10-CM

## 2024-07-02 DIAGNOSIS — I639 Cerebral infarction, unspecified: Secondary | ICD-10-CM | POA: Diagnosis not present

## 2024-07-02 DIAGNOSIS — R799 Abnormal finding of blood chemistry, unspecified: Secondary | ICD-10-CM | POA: Diagnosis not present

## 2024-07-02 DIAGNOSIS — J441 Chronic obstructive pulmonary disease with (acute) exacerbation: Secondary | ICD-10-CM | POA: Diagnosis not present

## 2024-07-02 DIAGNOSIS — E1142 Type 2 diabetes mellitus with diabetic polyneuropathy: Secondary | ICD-10-CM | POA: Diagnosis not present

## 2024-07-02 DIAGNOSIS — I700 Atherosclerosis of aorta: Secondary | ICD-10-CM | POA: Diagnosis not present

## 2024-07-02 DIAGNOSIS — Z87898 Personal history of other specified conditions: Secondary | ICD-10-CM

## 2024-07-02 DIAGNOSIS — J9621 Acute and chronic respiratory failure with hypoxia: Secondary | ICD-10-CM | POA: Diagnosis not present

## 2024-07-02 DIAGNOSIS — I69398 Other sequelae of cerebral infarction: Secondary | ICD-10-CM | POA: Diagnosis not present

## 2024-07-02 NOTE — Patient Instructions (Signed)
 I had a long discussion with the patient and her caregiver regarding her admission in March 2025 secondary to confusion likely related to encephalopathy from UTI.  She was also found to have silent left parietal lacunar strokes and has underlying mild cognitive impairment.  I recommend she continue aspirin  for stroke prevention but discontinue Plavix  due to increased bruising and maintain aggressive risk factor modification with strict control of hypertension blood pressure goal below 140/90, lipids with LDL cholesterol goal below 70 mg percent and diabetes with hemoglobin A1c goal below 6.5%.  Check follow-up lipid profile, hemoglobin A1c and EEG.  Treatment of UTI as per primary team.  Return for follow-up in 6 months or call earlier if necessary.

## 2024-07-02 NOTE — Progress Notes (Signed)
 [facility] [address] [place]. [place] [address] [phone]       OFFICE CONSULT NOTE  [name] White Date of Birth:  1941/02/11 Medical Record Number:  [id number]   Referring MD:  Arlin Krebs  Reason for Referral:  encephalopathy and stroke  HPI: Mr. [name] White is a 83 year old pleasant Caucasian lady seen today for initial office consultation visit.  She is accompanied by an aide from the assisted living facility where she lives.  History is obtained from them and review of electronic medical records and personally reviewed pertinent available imaging films in PACS.  She has past medical history of diabetes, hypertension, hyperlipidemia, anxiety, asthma, depression hypothyroidism and migraine.  She [facility] with acute onset of generalized weakness and worsening over the last couple of days confusion.  She had recently been treated for UTI.  She did complain of urinary frequency and urgency with dysuria.  On admission her labs suggested her UA is positive for UTI.  CT noncontrast CT of the head showed no acute abnormality.  MRI scan showed 2 tiny diffusion positive foci in the posterior left parietal as well as left periatrial white matter consistent with likely silent lacunar strokes.  MRA of the brain and neck both showed no large vessel stenosis or occlusion.  LDL cholesterol was elevated 117 mg percent.  TSH was normal CBC CMP were normal.  Vitamin B12 was low at 144.  EEG shows diffuse slowing.  Patient was advised to take aspirin  and Plavix  for 21 days and then Plavix  alone.  Patient was discharged to skilled nursing facility.  Vitamin B12 was replaced.  She is treated with Rocephin  and Duricef antibiotics for UTI.  However she is asked multiple subsequent ER and hospital visits on/2/25 for chest pain,/30/25 admission for septic shock repeat admission on 05/10/2024 for severe sepsis as well as on 05/22/2024.  Most recent admission was  on 05/29/2024 for hemorrhagic cystitis.  She is currently living in [facility].  She is able to ambulate with a walker with one-person assist.  She can feed herself and cognitively is improved and carry out conversation.  However she needs help with activities like changing her clothes and using them.  She still gets confused from time to time.  She has had some intermittent delusions and hallucination as well.  She in fact was started on a new antibiotic yesterday for presumed UTI recurrence.  There is no history of seizures, significant head injury or loss of consciousness or focal deficits from strokes ROS:   14 system review of systems is positive for confusion, disorientation, delusions, hallucinations, UTIs all systems negative  PMH:  Past Medical History:  Diagnosis Date   Anemia    Anxiety    Asthma    ASYMPTOMATIC POSTMENOPAUSAL STATUS 02/09/2009   Cataract    Cerebral infarction (HCC)    Constipation 03/19/2013   Depression    DIABETES MELLITUS, TYPE II 09/14/2007   HYPERCHOLESTEROLEMIA 02/09/2009   HYPERTENSION 02/09/2009   HYPOTHYROIDISM 09/14/2007   Metabolic encephalopathy    MIGRAINE HEADACHE 09/14/2007   Neuropathy    OSTEOPOROSIS 02/09/2009   PANCREATITIS 09/14/2007   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   PONV (postoperative nausea and vomiting)    Protein calorie malnutrition (HCC)    Rectocele 03/19/2013   Shortness of breath    SUPERFICIAL PHLEBITIS 09/14/2007   Varicose veins     Social History:  Social History   Socioeconomic History   Marital  status: Married    Spouse name: Not on file   Number of children: Not on file   Years of education: Not on file   Highest education level: Not on file  Occupational History   Occupation: Retired    Associate Professor: RETIRED  Tobacco Use   Smoking status: Never   Smokeless tobacco: Never  Vaping Use   Vaping status: Never Used  Substance and Sexual Activity   Alcohol use: No    Alcohol/week: 0.0  standard drinks of alcohol   Drug use: No   Sexual activity: Not Currently    Birth control/protection: Surgical  Other Topics Concern   Not on file  Social History Narrative   Not on file   Social Drivers of Health   Financial Resource Strain: Not on file  Food Insecurity: No Food Insecurity (05/22/2024)   Hunger Vital Sign    Worried About Running Out of Food in the Last Year: Never true    Ran Out of Food in the Last Year: Never true  Transportation Needs: No Transportation Needs (05/22/2024)   PRAPARE - Administrator, Civil Service (Medical): No    Lack of Transportation (Non-Medical): No  Physical Activity: Not on file  Stress: Not on file  Social Connections: Socially Isolated (05/22/2024)   Social Connection and Isolation Panel    Frequency of Communication with Friends and Family: More than three times a week    Frequency of Social Gatherings with Friends and Family: Twice a week    Attends Religious Services: Never    Database administrator or Organizations: No    Attends Banker Meetings: Never    Marital Status: Widowed  Intimate Partner Violence: Not At Risk (05/22/2024)   Humiliation, Afraid, Rape, and Kick questionnaire    Fear of Current or Ex-Partner: No    Emotionally Abused: No    Physically Abused: No    Sexually Abused: No    Medications:   Current Outpatient Medications on File Prior to Visit  Medication Sig Dispense Refill   acetaminophen  (TYLENOL ) 325 MG tablet Take 2 tablets (650 mg total) by mouth every 6 (six) hours as needed for mild pain (pain score 1-3) (or Fever >/= 101). 30 tablet 0   albuterol  (VENTOLIN  HFA) 108 (90 Base) MCG/ACT inhaler Inhale 2 puffs into the lungs every 4 (four) hours as needed for wheezing or shortness of breath. 8 g 3   aspirin  EC 81 MG tablet Take 81 mg by mouth daily. Swallow whole.     atorvastatin  (LIPITOR) 40 MG tablet Take 1 tablet (40 mg total) by mouth daily. 30 tablet 2   clopidogrel   (PLAVIX ) 75 MG tablet Take 1 tablet (75 mg total) by mouth daily.     clotrimazole (LOTRIMIN) 1 % cream Apply 1 Application topically 2 (two) times daily.     cycloSPORINE  (RESTASIS ) 0.05 % ophthalmic emulsion Place 1 drop into both eyes 2 (two) times daily.     dextromethorphan  (DELSYM ) 30 MG/5ML liquid Take 5 mLs (30 mg total) by mouth 2 (two) times daily as needed for cough. 89 mL 0   fluticasone  furoate-vilanterol (BREO ELLIPTA ) 200-25 MCG/ACT AEPB Inhale 1 puff into the lungs daily. 30 each 1   furosemide  (LASIX ) 20 MG tablet Take 1 tablet (20 mg total) by mouth daily. 30 tablet 0   ipratropium-albuterol  (DUONEB) 0.5-2.5 (3) MG/3ML SOLN Take 3 mLs by nebulization every 4 (four) hours as needed (wheezing, shortness of breath). 360 mL 1  levothyroxine  (SYNTHROID ) 50 MCG tablet Take 50 mcg by mouth daily.     metFORMIN  (GLUCOPHAGE ) 500 MG tablet Take 500 mg by mouth 2 (two) times daily.     midodrine  (PROAMATINE ) 5 MG tablet Take 5 mg by mouth.     Multiple Vitamins-Minerals (THERA-M) TABS Take 1 tablet by mouth daily.     nitrofurantoin , macrocrystal-monohydrate, (MACROBID ) 100 MG capsule Take 1 capsule (100 mg total) by mouth 2 (two) times daily. 10 capsule 0   omeprazole (PRILOSEC) 20 MG capsule Take 20 mg by mouth every morning.     polyethylene glycol (MIRALAX  / GLYCOLAX ) 17 g packet Take 17 g by mouth daily as needed for mild constipation. 30 each 0   potassium chloride  (KLOR-CON ) 10 MEQ tablet Take 10 mEq by mouth daily.     pregabalin  (LYRICA ) 150 MG capsule Take 1 capsule (150 mg total) by mouth 2 (two) times daily. 60 capsule 0   rizatriptan  (MAXALT ) 10 MG tablet Take 10 mg by mouth every 2 (two) hours as needed for migraine. No more than 2 doses in 24 hours     sulfamethoxazole-trimethoprim (BACTRIM DS) 800-160 MG tablet Take 1 tablet by mouth 2 (two) times daily.     tiZANidine (ZANAFLEX) 2 MG tablet Take 2 mg by mouth 2 (two) times daily.     traZODone  (DESYREL ) 50 MG tablet Take 1  tablet (50 mg total) by mouth at bedtime as needed for sleep. 30 tablet 0   TRINTELLIX  10 MG TABS tablet Take 10 mg by mouth daily.     umeclidinium bromide  (INCRUSE ELLIPTA ) 62.5 MCG/ACT AEPB Inhale 1 puff into the lungs daily. 1 each 1   venlafaxine  (EFFEXOR -XR) 150 MG 24 hr capsule Take 150 mg by mouth every morning.     Vibegron (GEMTESA) 75 MG TABS Take 75 mg by mouth at bedtime.     cefdinir  (OMNICEF ) 300 MG capsule Take 1 capsule (300 mg total) by mouth 2 (two) times daily. (Patient not taking: Reported on 07/02/2024) 4 capsule 0   senna (SENOKOT) 8.6 MG TABS tablet Take 2 tablets by mouth at bedtime. (Patient not taking: Reported on 07/02/2024)     No current facility-administered medications on file prior to visit.    Allergies:   Allergies  Allergen Reactions   Betadine [Povidone Iodine] Hives   Penicillins Rash    Physical Exam General: well developed, well nourished pleasant elderly Caucasian lady, seated, in no evident distress Head: head normocephalic and atraumatic.   Neck: supple with no carotid or supraclavicular bruits Cardiovascular: regular rate and rhythm, no murmurs Musculoskeletal: no deformity Skin:  no rash but scattered bilateral forearm petichiae Vascular:  Normal pulses all extremities  Neurologic Exam Mental Status: Awake and fully alert. Oriented to place and time. Recent and remote memory poor attention span, concentration and fund of knowledge diminished mood and affect appropriate.  Diminished recall 1/3.  Able to name 12 animals which can walk on forelegs.  Mini-Mental status exam score 21/30.  Unable to copy intersecting pentagons. Cranial Nerves: Fundoscopic exam reveals sharp disc margins. Pupils equal, briskly reactive to light. Extraocular movements full without nystagmus. Visual fields full to confrontation. Hearing intact. Facial sensation intact. Face, tongue, palate moves normally and symmetrically.  Motor: Normal bulk and tone. Normal antigravity  strength in all tested extremity muscles.  Mild action tremor of bilateral outstretched upper extremities. Sensory.: intact to touch , pinprick , position and vibratory sensation.  Coordination: Rapid alternating movements normal in all extremities. Finger-to-nose  and heel-to-shin performed accurately bilaterally. Gait and Station: Deferred as patient is one-person assist with a walker and did not bring it.   Reflexes: 1+ and symmetric. Toes downgoing.   NIHSS  1 Modified Rankin  3    07/02/2024   11:34 AM 07/02/2024   11:25 AM  MMSE - Mini Mental State Exam  Orientation to time 3 3  Orientation to Place 5 5  Registration 3 3  Attention/ Calculation 1 1  Recall 1 1  Language- name 2 objects 2 2  Language- repeat 1 1  Language- follow 3 step command 3   Language- read & follow direction 1   Write a sentence 1   Copy design 0   Total score 21      ASSESSMENT: 83 year old Caucasian lady with subacute memory and cognitive decline in the setting of recurrent UTIs and sepsis likely multifactorial from combination of B12 deficiency, recurrent infection and sepsis and from mild cognitive impairment with MRI showing silent subcortical lacunar infarcts     PLAN:I had a long discussion with the patient and her caregiver regarding her admission in March 2025 secondary to confusion likely related to encephalopathy from UTI.  She was also found to have silent left parietal lacunar strokes and has underlying mild cognitive impairment.  I recommend she continue aspirin  for stroke prevention but discontinue Plavix  due to increased bruising and maintain aggressive risk factor modification with strict control of hypertension blood pressure goal below 140/90, lipids with LDL cholesterol goal below 70 mg percent and diabetes with hemoglobin A1c goal below 6.5%.  Check follow-up lipid profile, hemoglobin A1c and EEG.  Treatment of UTI and B12 replacement as per primary team.  Return for follow-up in 6 months  or call earlier if necessary. Greater than 50% time during this prolonged 60-minute consultation visit for spent on counseling and coordination of care about her encephalopathy, mild cognitive impairment and silent lacunar strokes and discussion with patient and aide and answering questions Eather Popp, MD Note: This document was prepared with digital dictation and possible smart phrase technology. Any transcriptional errors that result from this process are unintentional.

## 2024-07-03 ENCOUNTER — Telehealth: Payer: Self-pay | Admitting: Neurology

## 2024-07-03 DIAGNOSIS — L89152 Pressure ulcer of sacral region, stage 2: Secondary | ICD-10-CM | POA: Diagnosis not present

## 2024-07-03 DIAGNOSIS — J441 Chronic obstructive pulmonary disease with (acute) exacerbation: Secondary | ICD-10-CM | POA: Diagnosis not present

## 2024-07-03 DIAGNOSIS — J9621 Acute and chronic respiratory failure with hypoxia: Secondary | ICD-10-CM | POA: Diagnosis not present

## 2024-07-03 DIAGNOSIS — E1142 Type 2 diabetes mellitus with diabetic polyneuropathy: Secondary | ICD-10-CM | POA: Diagnosis not present

## 2024-07-03 DIAGNOSIS — I69398 Other sequelae of cerebral infarction: Secondary | ICD-10-CM | POA: Diagnosis not present

## 2024-07-03 DIAGNOSIS — I700 Atherosclerosis of aorta: Secondary | ICD-10-CM | POA: Diagnosis not present

## 2024-07-03 LAB — HEMOGLOBIN A1C
Est. average glucose Bld gHb Est-mCnc: 235 mg/dL
Hgb A1c MFr Bld: 9.8 % — ABNORMAL HIGH (ref 4.8–5.6)

## 2024-07-03 LAB — LIPID PANEL
Chol/HDL Ratio: 3.2 ratio (ref 0.0–4.4)
Cholesterol, Total: 169 mg/dL (ref 100–199)
HDL: 53 mg/dL (ref >39)
LDL Chol Calc (NIH): 93 mg/dL (ref 0–99)
Triglycerides: 129 mg/dL (ref 0–149)
VLDL Cholesterol Cal: 23 mg/dL (ref 5–40)

## 2024-07-03 NOTE — Telephone Encounter (Signed)
Request to reschedule appointment

## 2024-07-04 ENCOUNTER — Other Ambulatory Visit: Payer: Self-pay | Admitting: Neurology

## 2024-07-04 ENCOUNTER — Emergency Department (HOSPITAL_COMMUNITY)
Admission: EM | Admit: 2024-07-04 | Discharge: 2024-07-04 | Disposition: A | Discharge status: Home / Self Care | Source: Skilled Nursing Facility | Attending: Emergency Medicine | Admitting: Emergency Medicine

## 2024-07-04 ENCOUNTER — Ambulatory Visit: Payer: Self-pay | Admitting: Neurology

## 2024-07-04 ENCOUNTER — Emergency Department (HOSPITAL_COMMUNITY)

## 2024-07-04 ENCOUNTER — Encounter (HOSPITAL_COMMUNITY): Payer: Self-pay | Admitting: Emergency Medicine

## 2024-07-04 DIAGNOSIS — W19XXXA Unspecified fall, initial encounter: Secondary | ICD-10-CM | POA: Diagnosis not present

## 2024-07-04 DIAGNOSIS — R220 Localized swelling, mass and lump, head: Secondary | ICD-10-CM | POA: Diagnosis not present

## 2024-07-04 DIAGNOSIS — S0101XA Laceration without foreign body of scalp, initial encounter: Secondary | ICD-10-CM | POA: Insufficient documentation

## 2024-07-04 DIAGNOSIS — R58 Hemorrhage, not elsewhere classified: Secondary | ICD-10-CM | POA: Diagnosis not present

## 2024-07-04 DIAGNOSIS — Z23 Encounter for immunization: Secondary | ICD-10-CM | POA: Diagnosis not present

## 2024-07-04 DIAGNOSIS — R609 Edema, unspecified: Secondary | ICD-10-CM | POA: Diagnosis not present

## 2024-07-04 DIAGNOSIS — S8002XA Contusion of left knee, initial encounter: Secondary | ICD-10-CM | POA: Insufficient documentation

## 2024-07-04 DIAGNOSIS — M4312 Spondylolisthesis, cervical region: Secondary | ICD-10-CM | POA: Diagnosis not present

## 2024-07-04 DIAGNOSIS — Z043 Encounter for examination and observation following other accident: Secondary | ICD-10-CM | POA: Diagnosis not present

## 2024-07-04 DIAGNOSIS — Z7982 Long term (current) use of aspirin: Secondary | ICD-10-CM | POA: Diagnosis not present

## 2024-07-04 DIAGNOSIS — M47812 Spondylosis without myelopathy or radiculopathy, cervical region: Secondary | ICD-10-CM | POA: Diagnosis not present

## 2024-07-04 DIAGNOSIS — S0003XA Contusion of scalp, initial encounter: Secondary | ICD-10-CM | POA: Diagnosis not present

## 2024-07-04 DIAGNOSIS — M4802 Spinal stenosis, cervical region: Secondary | ICD-10-CM | POA: Diagnosis not present

## 2024-07-04 DIAGNOSIS — R519 Headache, unspecified: Secondary | ICD-10-CM | POA: Diagnosis not present

## 2024-07-04 DIAGNOSIS — Z743 Need for continuous supervision: Secondary | ICD-10-CM | POA: Diagnosis not present

## 2024-07-04 DIAGNOSIS — S0990XA Unspecified injury of head, initial encounter: Secondary | ICD-10-CM | POA: Diagnosis not present

## 2024-07-04 DIAGNOSIS — S0083XA Contusion of other part of head, initial encounter: Secondary | ICD-10-CM

## 2024-07-04 MED ORDER — LIDOCAINE-EPINEPHRINE (PF) 2 %-1:200000 IJ SOLN
10.0000 mL | Freq: Once | INTRAMUSCULAR | Status: AC
  Administered 2024-07-04: 10 mL via INTRADERMAL
  Filled 2024-07-04: qty 20

## 2024-07-04 MED ORDER — ACETAMINOPHEN 325 MG PO TABS
650.0000 mg | ORAL_TABLET | Freq: Once | ORAL | Status: AC
  Administered 2024-07-04: 650 mg via ORAL
  Filled 2024-07-04: qty 2

## 2024-07-04 MED ORDER — TRAMADOL HCL 50 MG PO TABS
50.0000 mg | ORAL_TABLET | Freq: Once | ORAL | Status: AC
  Administered 2024-07-04: 50 mg via ORAL
  Filled 2024-07-04: qty 1

## 2024-07-04 MED ORDER — TETANUS-DIPHTH-ACELL PERTUSSIS 5-2.5-18.5 LF-MCG/0.5 IM SUSY
0.5000 mL | PREFILLED_SYRINGE | Freq: Once | INTRAMUSCULAR | Status: AC
  Administered 2024-07-04: 0.5 mL via INTRAMUSCULAR
  Filled 2024-07-04 (×2): qty 0.5

## 2024-07-04 MED ORDER — ATORVASTATIN CALCIUM 40 MG PO TABS: 80.0000 mg | ORAL_TABLET | Freq: Every day | ORAL | 2 refills | Status: DC

## 2024-07-04 NOTE — Discharge Instructions (Signed)
 Continue to apply ice packs on and off to help with the swelling of your forehead and knee area.  Wear the Ace wrap as needed intermittently for the next 2 to 3 days to help with swelling.  I recommend Tylenol  1000 mg twice a day as needed for pain.  Laceration to the scalp clean with mild soap and water , staples out in 7 days.  Turn to the emergency department for any new or worsening symptoms.

## 2024-07-04 NOTE — Progress Notes (Signed)
 Kindly inform patient that lipid profile does not satisfactory and bad cholesterol is elevated so I recommend increase the dose of Lipitor to 80 mg daily.  Diabetes is also poorly controlled and I recommend patient see primary care physician more aggressive diabetes treatment

## 2024-07-04 NOTE — ED Notes (Signed)
 ED Provider at bedside.

## 2024-07-04 NOTE — ED Provider Notes (Signed)
 [place] EMERGENCY DEPARTMENT AT [facility] Provider Note   CSN: [id number] Arrival date & time: 07/04/24  1603     Patient presents with: [name] White   KARLEI WALDO is a 83 y.o. female.    Fall       [name] White is a 83 y.o. female who presents to to the emergency department who resides at [facility] for evaluation of fall.  Fall was witnessed by caregiver there.  She was transferring from wheelchair to chair and toppled over nursing staff at the facility states that he believes she may have hit a bedside table.  No loss of consciousness or vomiting.  They noted swelling to her left forehead area.  She is currently on antibiotic for UTI that was diagnosed 3 days ago.  She was seen by neurology on 7/8 was previously taking Plavix  but this was discontinued.  History obtained by facility staff.  Patient has confusion at baseline unable to provide relevant history.    Prior to Admission medications   Medication Sig Start Date End Date Taking? Authorizing Provider  acetaminophen  (TYLENOL ) 325 MG tablet Take 2 tablets (650 mg total) by mouth every 6 (six) hours as needed for mild pain (pain score 1-3) (or Fever >/= 101). 01/01/24   ShahmehdiAdriana LABOR, MD  albuterol  (VENTOLIN  HFA) 108 (90 Base) MCG/ACT inhaler Inhale 2 puffs into the lungs every 4 (four) hours as needed for wheezing or shortness of breath. 05/01/24   Johnson, Clanford L, MD  aspirin  EC 81 MG tablet Take 81 mg by mouth daily. Swallow whole.    [provider]  atorvastatin  (LIPITOR) 40 MG tablet Take 2 tablets (80 mg total) by mouth daily. 07/04/24   Rosemarie Eather GORMAN, MD  cefdinir  (OMNICEF ) 300 MG capsule Take 1 capsule (300 mg total) by mouth 2 (two) times daily. Patient not taking: Reported on 07/02/2024 05/28/24   Evonnie Lenis, MD  clotrimazole (LOTRIMIN) 1 % cream Apply 1 Application topically 2 (two) times daily. 05/21/24   [provider]  cycloSPORINE  (RESTASIS ) 0.05 %  ophthalmic emulsion Place 1 drop into both eyes 2 (two) times daily.    [provider]  dextromethorphan  (DELSYM ) 30 MG/5ML liquid Take 5 mLs (30 mg total) by mouth 2 (two) times daily as needed for cough. 05/01/24   Johnson, Clanford L, MD  fluticasone  furoate-vilanterol (BREO ELLIPTA ) 200-25 MCG/ACT AEPB Inhale 1 puff into the lungs daily. 03/19/24   Pearlean Manus, MD  furosemide  (LASIX ) 20 MG tablet Take 1 tablet (20 mg total) by mouth daily. 05/03/24 05/03/25  Johnson, Clanford L, MD  ipratropium-albuterol  (DUONEB) 0.5-2.5 (3) MG/3ML SOLN Take 3 mLs by nebulization every 4 (four) hours as needed (wheezing, shortness of breath). 05/01/24   Johnson, Clanford L, MD  levothyroxine  (SYNTHROID ) 50 MCG tablet Take 50 mcg by mouth daily.    [provider]  metFORMIN  (GLUCOPHAGE ) 500 MG tablet Take 500 mg by mouth 2 (two) times daily. 04/16/24   [provider]  midodrine  (PROAMATINE ) 5 MG tablet Take 5 mg by mouth. 06/05/24   [provider]  Multiple Vitamins-Minerals (THERA-M) TABS Take 1 tablet by mouth daily.    [provider]  nitrofurantoin , macrocrystal-monohydrate, (MACROBID ) 100 MG capsule Take 1 capsule (100 mg total) by mouth 2 (two) times daily. 05/29/24   Haviland, Julie, MD  omeprazole (PRILOSEC) 20 MG capsule Take 20 mg by mouth every morning.    [provider]  polyethylene glycol (MIRALAX  /  GLYCOLAX ) 17 g packet Take 17 g by mouth daily as needed for mild constipation. 05/01/24   Johnson, Clanford L, MD  potassium chloride  (KLOR-CON ) 10 MEQ tablet Take 10 mEq by mouth daily. 05/15/24   [provider]  pregabalin  (LYRICA ) 150 MG capsule Take 1 capsule (150 mg total) by mouth 2 (two) times daily. 03/19/24   Pearlean Manus, MD  rizatriptan  (MAXALT ) 10 MG tablet Take 10 mg by mouth every 2 (two) hours as needed for migraine. No more than 2 doses in 24 hours 04/16/24   [provider]  senna (SENOKOT) 8.6 MG TABS tablet Take 2  tablets by mouth at bedtime. Patient not taking: Reported on 07/02/2024    [provider]  sulfamethoxazole-trimethoprim (BACTRIM DS) 800-160 MG tablet Take 1 tablet by mouth 2 (two) times daily. 07/01/24   [provider]  tiZANidine (ZANAFLEX) 2 MG tablet Take 2 mg by mouth 2 (two) times daily. 06/21/24   [provider]  traZODone  (DESYREL ) 50 MG tablet Take 1 tablet (50 mg total) by mouth at bedtime as needed for sleep. 03/19/24   Pearlean Manus, MD  TRINTELLIX  10 MG TABS tablet Take 10 mg by mouth daily. 03/09/24   [provider]  umeclidinium bromide  (INCRUSE ELLIPTA ) 62.5 MCG/ACT AEPB Inhale 1 puff into the lungs daily. 05/27/24   Evonnie Lenis, MD  venlafaxine  (EFFEXOR -XR) 150 MG 24 hr capsule Take 150 mg by mouth every morning.    [provider]  Vibegron (GEMTESA) 75 MG TABS Take 75 mg by mouth at bedtime.    [provider]    Allergies: Betadine [povidone iodine] and Penicillins    Review of Systems  Unable to perform ROS: Dementia    Updated Vital Signs BP 120/83 (BP Location: Left Arm)   Pulse 80   Temp 98 F (36.7 C) (Oral)   Resp 18   Ht 5' 5 (1.651 m)   Wt 59 kg   SpO2 97%   BMI 21.64 kg/m   Physical Exam Vitals and nursing note reviewed.  Constitutional:      General: She is not in acute distress.    Appearance: Normal appearance. She is not ill-appearing.  HENT:     Head:     Comments: 2 cm laceration left parietal scalp.  Bleeding controlled.  Small hematoma noted.  There is also a golf ball sized hematoma left forehead.    Mouth/Throat:     Mouth: Mucous membranes are moist.  Eyes:     Extraocular Movements: Extraocular movements intact.     Conjunctiva/sclera: Conjunctivae normal.     Pupils: Pupils are equal, round, and reactive to light.  Cardiovascular:     Rate and Rhythm: Normal rate and regular rhythm.     Pulses: Normal pulses.  Pulmonary:     Effort: Pulmonary effort is normal.   Musculoskeletal:        General: Tenderness and signs of injury present.     Cervical back: Normal range of motion. No tenderness.     Comments: Localized swelling anterior left knee.  Mild bruising noted.  Small abrasion.  Patient has full range of motion.  No bony deformities or step-offs of the knee.  Skin:    General: Skin is warm.     Capillary Refill: Capillary refill takes less than 2 seconds.  Neurological:     Mental Status: She is alert. Mental status is at baseline.     Sensory: No sensory deficit.  Motor: No weakness.     (all labs ordered are listed, but only abnormal results are displayed) Labs Reviewed - No data to display  EKG: None  Radiology: DG Knee Complete 4 Views Left Result Date: 07/04/2024 CLINICAL DATA:  Fall with swelling EXAM: LEFT KNEE - COMPLETE 4+ VIEW COMPARISON:  None Available. FINDINGS: No definitive fracture or malalignment. Protuberant lateral and anterior patellar and infrapatellar soft tissue swelling. No significant effusion. Minimal chondrocalcinosis IMPRESSION: No acute osseous abnormality. Protuberant lateral and anterior patellar and infrapatellar soft tissue swelling. Electronically Signed   By: Luke Bun M.D.   On: 07/04/2024 17:21   CT Cervical Spine Wo Contrast Result Date: 07/04/2024 CLINICAL DATA:  Unwitnessed fall EXAM: CT CERVICAL SPINE WITHOUT CONTRAST TECHNIQUE: Multidetector CT imaging of the cervical spine was performed without intravenous contrast. Multiplanar CT image reconstructions were also generated. RADIATION DOSE REDUCTION: This exam was performed according to the departmental dose-optimization program which includes automated exposure control, adjustment of the mA and/or kV according to patient size and/or use of iterative reconstruction technique. COMPARISON:  CT 12/25/2023 FINDINGS: Alignment: Stable trace anterolisthesis C4 on C5, and C7 on T1. Facet alignment is within normal limits Skull base and vertebrae: No  acute fracture. No primary bone lesion or focal pathologic process. Soft tissues and spinal canal: No prevertebral fluid or swelling. No visible canal hematoma. Disc levels: Multilevel degenerative change. Moderate disc space narrowing at C3-C4 and C4-C5 and C7-T1 with advanced disc space narrowing C5-C6 and C6-C7. multilevel facet degenerative change with foraminal narrowing Upper chest: Negative. Other: None IMPRESSION: Degenerative changes without acute osseous abnormality. Electronically Signed   By: Luke Bun M.D.   On: 07/04/2024 17:20   CT Head Wo Contrast Result Date: 07/04/2024 CLINICAL DATA:  Head trauma fall hematoma to left forehead EXAM: CT HEAD WITHOUT CONTRAST TECHNIQUE: Contiguous axial images were obtained from the base of the skull through the vertex without intravenous contrast. RADIATION DOSE REDUCTION: This exam was performed according to the departmental dose-optimization program which includes automated exposure control, adjustment of the mA and/or kV according to patient size and/or use of iterative reconstruction technique. COMPARISON:  CT brain 04/24/2024, MRI 03/15/2024, 03/11/2024, head CT 12/25/2023 FINDINGS: Brain: No acute territorial infarction or hemorrhage. Atrophy and chronic small vessel ischemic changes of the white matter. Chronic white matter and cerebellar infarcts. 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 Vascular: No hyperdense vessels.  No unexpected calcification Skull: Normal. Negative for fracture or focal lesion. Sinuses/Orbits: Postsurgical changes.  Minimal mucosal thickening Other: Moderate left forehead scalp hematoma IMPRESSION: 1. No CT evidence for acute intracranial abnormality. No hemorrhage. 2. Atrophy and chronic small vessel ischemic changes of the white matter. Chronic white matter and cerebellar infarcts. 3. 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 4. Moderate left forehead scalp  hematoma. Electronically Signed   By: Luke Bun M.D.   On: 07/04/2024 17:16     Procedures    LACERATION REPAIR Performed by: Jetta Murray Authorized by: Qadir Folks Consent: Verbal consent obtained. Risks and benefits: risks, benefits and alternatives were discussed Consent given by: patient Patient identity confirmed: provided demographic data Prepped and Draped in normal sterile fashion Wound explored  Laceration Location: left scalp  Laceration Length: 3 cm  No Foreign Bodies seen or palpated  Anesthesia: local infiltration  Local anesthetic: lidocaine  2% with epinephrine   Anesthetic total: 3 ml  Irrigation method: syringe Amount of cleaning: standard  Skin closure: staples  Number of staples:  6  Technique: stapling  Patient tolerance: Patient tolerated the procedure well with no immediate complications.   Medications Ordered in the ED - No data to display                                  Medical Decision Making Patient here from [facility] for evaluation of fall.  Was previously taking Plavix  but Plavix  was discontinued 2 days ago.  Currently being treated for UTI as well.  Facility staff states was mechanical fall.   Laceration with hematoma, subdural hematoma, skull fracture, fracture dislocation of the knee along with contusion All considered  Amount and/or Complexity of Data Reviewed Radiology: ordered.    Details: CT head without acute intracranial finding.  CT cervical spine without evidence of acute bony abnormality or traumatic subluxation.  X-ray of the left knee without fracture or dislocation. Discussion of management or test interpretation with external provider(s): X-ray/CT's are reassuring.  Patient tolerating oral fluids without difficulty.  Vital signs reassuring.  Hematoma of left forehead and irregular shaped laceration no bleeding prior to closure.  Cleaned and explored through entire depth of wound  prior to closure.  Approximated nicely with staples.  Td updated here.  Left knee wrapped with ACE  Pt able to stand at bedside and make few steps with walker.  Pt daughter at bedside states this is her baseline mobility and typically uses a wheelchair  Patient ambulated in the department with walker.  No difficulty.  Appears appropriate for discharge back to facility.  Wound care instructions were given.  Staples out in 5 to 7 days.    Risk OTC drugs. Prescription drug management.        Final diagnoses:  Fall, initial encounter  Contusion of left knee, initial encounter  Traumatic hematoma of forehead, initial encounter  Laceration of scalp, initial encounter    ED Discharge Orders     None          Herlinda Milling, DEVONNA 07/05/24 1010    Cleotilde Rogue, MD 07/05/24 1624

## 2024-07-04 NOTE — ED Triage Notes (Addendum)
 Pt arrives via EMS from [facility] after unwitnessed fall causing hematoma to L forehead and lac to L side of head. Bleeding controlled at this time. Denies LOC. Pt on plavix  and aspirin . Aox4.

## 2024-07-04 NOTE — ED Notes (Signed)
 Patient transported to CT

## 2024-07-04 NOTE — ED Notes (Signed)
 Wound cleansed with sterile water  and hydrogen peroxide. Staple/suture setup at bedside for provider.

## 2024-07-08 ENCOUNTER — Encounter: Payer: Self-pay | Admitting: Internal Medicine

## 2024-07-09 ENCOUNTER — Other Ambulatory Visit: Admitting: *Deleted

## 2024-07-09 DIAGNOSIS — I69398 Other sequelae of cerebral infarction: Secondary | ICD-10-CM | POA: Diagnosis not present

## 2024-07-09 DIAGNOSIS — J441 Chronic obstructive pulmonary disease with (acute) exacerbation: Secondary | ICD-10-CM | POA: Diagnosis not present

## 2024-07-09 DIAGNOSIS — N390 Urinary tract infection, site not specified: Secondary | ICD-10-CM | POA: Insufficient documentation

## 2024-07-09 DIAGNOSIS — I700 Atherosclerosis of aorta: Secondary | ICD-10-CM | POA: Diagnosis not present

## 2024-07-09 DIAGNOSIS — J9621 Acute and chronic respiratory failure with hypoxia: Secondary | ICD-10-CM | POA: Diagnosis not present

## 2024-07-09 DIAGNOSIS — L89152 Pressure ulcer of sacral region, stage 2: Secondary | ICD-10-CM | POA: Diagnosis not present

## 2024-07-09 DIAGNOSIS — E1142 Type 2 diabetes mellitus with diabetic polyneuropathy: Secondary | ICD-10-CM | POA: Diagnosis not present

## 2024-07-09 NOTE — Progress Notes (Unsigned)
 Name: [name] White DOB: 1941/01/22 MRN: [id number]  History of Present Illness: Ms. Terrero is a 83 y.o. female who presents today as a new patient at [facility] [place]. All available relevant medical records have been reviewed. She resides at [facility] assisted living facility and is accompanied by [facility] (CNA). Relevant History includes: 1. Vaginal atrophy.  She reports chief complaint of recurrent UTls.  Urine culture results in past 12 months: - 12/26/2023: Positive for E. coli - 02/20/2024: Positive for E. coli - 03/11/2024: Negative  - 05/10/2024: Positive for Enterococcus faecalis  - 05/22/2024: Positive for Enterococcus faecalis (VRE) - 06/25/2024: Negative  Recent history:  > 03/27/2024: CT abdomen/pelvis w/ contrast at [facility] showed no GU stones, masses, or hydronephrosis. Benign bilateral renal cysts. Bladder unremarkable.  > 05/10/2024- 05/15/2024: Admitted for severe sepsis secondary to pneumonia. Also found to have a UTI; urine culture positive for Enterococcus faecalis.   > 05/22/2024- 05/28/2024: Admitted for severe sepsis secondary to pneumonia and UTI. Urine culture positive for Enterococcus faecalis (VRE). Treated with Linezolid , Cefepime , and Azithromycin .   > 05/28/2024: Seen in ER. Pt noticed some blood when she wiped for the past few days. She is not sure if it is coming from her vagina or urethra. On exam Bleeding looks to be coming from urethra. Diagnosed with hemorrhagic cystitis. Macrobid  prescribed for UTI. Also had yeast in urine: Diflucan  given.  Today: She reports that over the past few months she has had new-onset urinary hesitancy and straining to void. She has constipation at times. She denies urinary urgency, frequency, dysuria, gross hematuria, or sensations of incomplete emptying. She is taking Gemtesa as prescribed by her PCP (Dr. [name]); per current med sheet from her SNF it appears that Gemtesa was started on 04/17/2024. She reports that prior  to starting Gemtesa she had urinary urgency & frequency; denies history of urinary incontinence.   Medications: Current Outpatient Medications  Medication Sig Dispense Refill   acetaminophen  (TYLENOL ) 325 MG tablet Take 2 tablets (650 mg total) by mouth every 6 (six) hours as needed for mild pain (pain score 1-3) (or Fever >/= 101). 30 tablet 0   albuterol  (VENTOLIN  HFA) 108 (90 Base) MCG/ACT inhaler Inhale 2 puffs into the lungs every 4 (four) hours as needed for wheezing or shortness of breath. 8 g 3   aspirin  EC 81 MG tablet Take 81 mg by mouth daily. Swallow whole.     atorvastatin  (LIPITOR) 40 MG tablet Take 2 tablets (80 mg total) by mouth daily. 60 tablet 2   clotrimazole (LOTRIMIN) 1 % cream Apply 1 Application topically 2 (two) times daily.     cycloSPORINE  (RESTASIS ) 0.05 % ophthalmic emulsion Place 1 drop into both eyes 2 (two) times daily.     dextromethorphan  (DELSYM ) 30 MG/5ML liquid Take 5 mLs (30 mg total) by mouth 2 (two) times daily as needed for cough. 89 mL 0   estradiol  (ESTRACE ) 0.1 MG/GM vaginal cream Discard plastic applicator. SNF staff are to place 1 gram (approximately a blueberry-sized amount) of estrogen cream on a gloved fingertip then apply the medication by gently inserting same finger approximately 1-2 cm inside patient's vaginal opening once per night at bedtime. For long term use. 30 g 3   fluticasone  furoate-vilanterol (BREO ELLIPTA ) 200-25 MCG/ACT AEPB Inhale 1 puff into the lungs daily. 30 each 1   furosemide  (LASIX ) 20 MG tablet Take 1 tablet (20 mg total) by mouth daily. 30 tablet 0   ipratropium-albuterol  (DUONEB) 0.5-2.5 (3)  MG/3ML SOLN Take 3 mLs by nebulization every 4 (four) hours as needed (wheezing, shortness of breath). 360 mL 1   levothyroxine  (SYNTHROID ) 50 MCG tablet Take 50 mcg by mouth daily.     metFORMIN  (GLUCOPHAGE ) 500 MG tablet Take 500 mg by mouth 2 (two) times daily.     midodrine  (PROAMATINE ) 5 MG tablet Take 5 mg by mouth.     Multiple  Vitamins-Minerals (THERA-M) TABS Take 1 tablet by mouth daily.     nitrofurantoin  (MACRODANTIN ) 100 MG capsule Take 1 capsule (100 mg total) by mouth daily. 30 capsule 2   NONFORMULARY OR COMPOUNDED ITEM If patient is incontinent of urine and/or stool, SNF staff are instructed to cleanse patient's genital area at least once every 8 hours and change diaper if soiled. 1 each 99   NONFORMULARY OR COMPOUNDED ITEM SNF staff are instructed to encourage hydration with water  at least once every 2-4 hours while patient is awake. 1 each 99   NONFORMULARY OR COMPOUNDED ITEM SNF staff are instructed to prompt toileting and assist patient with ambulation to toilet at least once every 4-6 hours while awake. 1 each 99   omeprazole (PRILOSEC) 20 MG capsule Take 20 mg by mouth every morning.     polyethylene glycol (MIRALAX  / GLYCOLAX ) 17 g packet Take 17 g by mouth daily as needed for mild constipation. 30 each 0   potassium chloride  (KLOR-CON ) 10 MEQ tablet Take 10 mEq by mouth daily.     pregabalin  (LYRICA ) 150 MG capsule Take 1 capsule (150 mg total) by mouth 2 (two) times daily. 60 capsule 0   rizatriptan  (MAXALT ) 10 MG tablet Take 10 mg by mouth every 2 (two) hours as needed for migraine. No more than 2 doses in 24 hours     senna (SENOKOT) 8.6 MG TABS tablet Take 2 tablets by mouth at bedtime.     tiZANidine (ZANAFLEX) 2 MG tablet Take 2 mg by mouth 2 (two) times daily.     traZODone  (DESYREL ) 50 MG tablet Take 1 tablet (50 mg total) by mouth at bedtime as needed for sleep. 30 tablet 0   TRINTELLIX  10 MG TABS tablet Take 10 mg by mouth daily.     umeclidinium bromide  (INCRUSE ELLIPTA ) 62.5 MCG/ACT AEPB Inhale 1 puff into the lungs daily. 1 each 1   venlafaxine  (EFFEXOR -XR) 150 MG 24 hr capsule Take 150 mg by mouth every morning.     No current facility-administered medications for this visit.    Allergies: Allergies  Allergen Reactions   Betadine [Povidone Iodine] Hives   Penicillins Rash    Past  Medical History:  Diagnosis Date   Anemia    Anxiety    Asthma    ASYMPTOMATIC POSTMENOPAUSAL STATUS 02/09/2009   Cataract    Cerebral infarction [facility])    Constipation 03/19/2013   Depression    DIABETES MELLITUS, TYPE II 09/14/2007   HYPERCHOLESTEROLEMIA 02/09/2009   HYPERTENSION 02/09/2009   HYPOTHYROIDISM 09/14/2007   Metabolic encephalopathy    MIGRAINE HEADACHE 09/14/2007   Neuropathy    OSTEOPOROSIS 02/09/2009   PANCREATITIS 09/14/2007   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   PONV (postoperative nausea and vomiting)    Protein calorie malnutrition (HCC)    Rectocele 03/19/2013   Shortness of breath    SUPERFICIAL PHLEBITIS 09/14/2007   Varicose veins    Past Surgical History:  Procedure Laterality Date   ABDOMINAL HYSTERECTOMY     ANTERIOR AND POSTERIOR REPAIR N/A 04/02/2013   Procedure: ANTERIOR (CYSTOCELE) AND POSTERIOR  REPAIR (RECTOCELE);  Surgeon: Norleen LULLA Server, MD;  Location: [facility];  Service: Gynecology;  Laterality: N/A;   APPENDECTOMY     BRAIN SURGERY     CATARACT EXTRACTION W/PHACO  12/05/2011   Procedure: CATARACT EXTRACTION PHACO AND INTRAOCULAR LENS PLACEMENT (IOC);  Surgeon: Cherene Mania;  Location: [facility];  Service: Ophthalmology;  Laterality: Left;  CDE:9.65   CHOLECYSTECTOMY     CRANIOTOMY N/A 06/04/2020   Procedure: Endoscopic Transphenoidal Resection of Recurrent PituitaryTumor;  Surgeon: Colon Shove, MD;  Location: [facility] OR;  Service: Neurosurgery;  Laterality: N/A;   CYSTOSCOPY     ENDOVENOUS ABLATION SAPHENOUS VEIN W/ LASER  11-03-2011   right greater saphenous vein   left leg done 10-2011   EYE SURGERY  98   right cataract extraction 98   LAPAROSCOPIC NISSEN FUNDOPLICATION     NM ESOPHAGEAL REFLUX  08-11-11   PITUITARY EXCISION  10/1997   POSTERIOR REPAIR     TRANSNASAL APPROACH N/A 06/04/2020   Procedure: TRANSNASAL APPROACH;  Surgeon: Mable Lenis, MD;  Location: [facility] OR;  Service: ENT;  Laterality: N/A;   TRANSPHENOIDAL / TRANSNASAL  HYPOPHYSECTOMY / RESECTION PITUITARY TUMOR  08-11-11   Family History  Problem Relation Age of Onset   Heart disease Mother    Asthma Mother    COPD Father    Heart disease Father    Stroke Father    Asthma Daughter    Migraines Daughter    Neuropathy Son    Cancer Neg Hx    Anesthesia problems Neg Hx    Hypotension Neg Hx    Malignant hyperthermia Neg Hx    Pseudochol deficiency Neg Hx    Social History   Socioeconomic History   Marital status: Married    Spouse name: Not on file   Number of children: Not on file   Years of education: Not on file   Highest education level: Not on file  Occupational History   Occupation: Retired    Associate Professor: RETIRED  Tobacco Use   Smoking status: Never   Smokeless tobacco: Never  Vaping Use   Vaping status: Never Used  Substance and Sexual Activity   Alcohol use: No    Alcohol/week: 0.0 standard drinks of alcohol   Drug use: No   Sexual activity: Not Currently    Birth control/protection: Surgical  Other Topics Concern   Not on file  Social History Narrative   Not on file   Social Drivers of Health   Financial Resource Strain: Not on file  Food Insecurity: No Food Insecurity (05/22/2024)   Hunger Vital Sign    Worried About Running Out of Food in the Last Year: Never true    Ran Out of Food in the Last Year: Never true  Transportation Needs: No Transportation Needs (05/22/2024)   PRAPARE - Administrator, Civil Service (Medical): No    Lack of Transportation (Non-Medical): No  Physical Activity: Not on file  Stress: Not on file  Social Connections: Socially Isolated (05/22/2024)   Social Connection and Isolation Panel    Frequency of Communication with Friends and Family: More than three times a week    Frequency of Social Gatherings with Friends and Family: Twice a week    Attends Religious Services: Never    Database administrator or Organizations: No    Attends Banker Meetings: Never    Marital  Status: Widowed  Intimate Partner Violence: Not At Risk (05/22/2024)   Humiliation, Afraid,  Rape, and Kick questionnaire    Fear of Current or Ex-Partner: No    Emotionally Abused: No    Physically Abused: No    Sexually Abused: No    SUBJECTIVE  Review of Systems Constitutional: Patient denies any unintentional weight loss or change in strength lntegumentary: Patient denies any rashes or pruritus Cardiovascular: Patient denies chest pain or syncope Respiratory: Patient denies shortness of breath Gastrointestinal: As per HPI Musculoskeletal: Patient denies muscle cramps or weakness Neurologic: Patient denies convulsions or seizures Allergic/Immunologic: Patient denies recent allergic reaction(s) Hematologic/Lymphatic: Patient denies bleeding tendencies Endocrine: Patient denies heat/cold intolerance  GU: As per HPI.  OBJECTIVE Vitals:   07/10/24 1117  BP: 95/62  Pulse: (!) 112   There is no height or weight on file to calculate BMI.  Physical Examination Constitutional: No obvious distress; patient is non-toxic appearing  Cardiovascular: No visible lower extremity edema.  Respiratory: The patient does not have audible wheezing/stridor; respirations do not appear labored  Gastrointestinal: Abdomen non-distended Musculoskeletal: Normal ROM of UEs  Skin: No obvious rashes/open sores  Neurologic: CN 2-12 grossly intact Psychiatric: Answered questions appropriately with normal affect  Hematologic/Lymphatic/Immunologic: No obvious bruises or sites of spontaneous bleeding  UA: Patient unable to void in office today  PVR: >200 ml  ASSESSMENT Recurrent UTI - Plan: Urinalysis, Routine w reflex microscopic, BLADDER SCAN AMB NON-IMAGING, nitrofurantoin  (MACRODANTIN ) 100 MG capsule, estradiol  (ESTRACE ) 0.1 MG/GM vaginal cream, NONFORMULARY OR COMPOUNDED ITEM, NONFORMULARY OR COMPOUNDED ITEM, NONFORMULARY OR COMPOUNDED ITEM, DISCONTINUED: estradiol  (ESTRACE ) 0.1 MG/GM vaginal  cream  VRE infection within last 3 months - Plan: Urinalysis, Routine w reflex microscopic, BLADDER SCAN AMB NON-IMAGING  Hospital discharge follow-up  MENOPAUSAL SYNDROME - Plan: estradiol  (ESTRACE ) 0.1 MG/GM vaginal cream, DISCONTINUED: estradiol  (ESTRACE ) 0.1 MG/GM vaginal cream  Incomplete bladder emptying  Difficulty voiding  OAB (overactive bladder)  We reviewed history in detail. It appears that she has OAB with urinary frequency and urgency at baseline for which she started Gemtesa in April 2025 and subsequently developed voiding dysfunction / incomplete bladder emptying, which is a known potential side effect of beta-3 adrenergic agonists such as Gemtesa. Her incomplete bladder emptying is likely why she then developed recurrent UTIs; additional UTI risk factors for her include vaginal atrophy.   Will request records from PCP (Dr. [name]) including visit notes regarding urinary symptoms within the past 12 months; specifically looking to confirm if Louanne was indeed started on 04/17/2024 as per SNF med list or if she had been on it prior to then. It is possible that her voiding dysfunction / incomplete bladder emptying could alternatively be due to possible neurogenic bladder secondary to her T2DM with neuropathy and stroke history.    We discussed the possible etiologies of recurrent UTls including ascending infection related to intercourse; vaginal atrophy; transmural infection that has been treated incompletely; urinary tract stones; incomplete bladder emptying with urinary stasis; kidney or bladder tumor; urethral diverticulum; and colonization of  vagina and urinary tract with pathologic, adherent organisms.   For UTI prevention advised: > Maintain adequate fluid intake daily to flush out the urinary tract. > Go to the bathroom to urinate every 4-6 hours while awake to minimize urinary stasis / bacterial overgrowth in the bladder. > Starting topical vaginal estrogen cream. The  rationale, appropriate use, and potential pros / cons were discussed in detail.  > UTI prophylaxis with a daily low dose antibiotic for 3 months. Macrodantin  (Nitrofurantoin ) selected based on most recent urine culture sensitivity report; we discussed risk  for pulmonary and liver fibrosis with that medication and will plan to limit duration.  > Handout provided with additional recommendations / options for UTI prevention.   Will plan nurse visit for PVR check in a few weeks and follow up with Urology provider in 3 months or sooner if needed.   Patient verbalized understanding of and agreement with current plan. All questions were answered.  PLAN Advised the following: 1. Requesting records from PCP.  2. Discontinue Gemtesa.  3. Start topical vaginal estrogen cream as prescribed. 4. Macrodantin  (Nitrofurantoin ) 100 mg daily for UTI prophylaxis. 5. Staff at SNF are instructed to encourage hydration with water  at least once every 2-4 hours while patient is awake.  6. Staff at SNF are instructed to assist patient with ambulation to toilet at least once every 4-6 hours while awake. 7. Return in about 2 weeks (around 07/24/2024) for nurse visit for UA & PVR check. 8. Follow up with Urology provider in 3 months.   Orders Placed This Encounter  Procedures   Urinalysis, Routine w reflex microscopic   BLADDER SCAN AMB NON-IMAGING   Total time spent caring for the patient today was over 45 minutes. This includes time spent on the date of the visit reviewing the patient's chart before the visit, time spent during the visit, and time spent after the visit on documentation. Over 50% of that time was spent in face-to-face time with this patient for direct counseling. E&M based on time and complexity of medical decision making.  It has been explained that the patient is to follow regularly with their PCP in addition to all other providers involved in their care and to follow instructions provided by these  respective offices. Patient advised to contact urology clinic if any urologic-pertaining questions, concerns, new symptoms or problems arise in the interim period.  Patient Instructions  UTI prevention / management:  UTI symptoms may include:  - Pain / burning / discomfort when urinating - Recent increase in urinary urgency (how quickly you feel like you need to rush to the bathroom) - Recent increase in urinary frequency (how often you are urinating) - Fever - Acute mental status change / confusion - Fatigue / Feeling tired - Weakness - Note: Urine color, clarity, and odor are not considered to be clinically significant indicators of UTI and do not warrant urine testing unless patient is also experiencing UTI symptoms such as those listed above.  Difference between Urinalysis (urine dipstick test) and Urine culture / Why urine culture often needed to determine appropriate diagnosis and treatment of urologic symptoms: > Urinalysis (urine dipstick test): A quick office test used as an indicator to determine whether or not further testing is necessary (such as a urine culture, urine microscopy, etc.) The urinalysis cannot differentiate a true bacterial UTI or give a definitive diagnosis for the findings.  > Urine culture: May be performed based on the findings of a urinalysis to evaluate for UTI. Grows out on a petri dish for 48-72 hours. Provides important information about: whether or not bacterial growth is present and if so: what the predominant bacteria is which antibiotics will work best against that bacteria That information is important so that we can diagnose and treat patients appropriately as there are other conditions which may mimic UTls which must not be missed (such as cancer, interstitial cystitis, stones, etc.). Assists us  with antibiotic stewardship to minimize patient's risk for developing antibiotic resistance (getting to a point where no antibiotics work  anymore).  Options when  UTI symptoms occur: 1. Call [facility]  and request to speak with triage nurse (phone # [phone], select option 3). In accordance with clinic guidelines the nurse will determine next steps based on patient-reported symptoms, which may include: same-day lab visit to provide urine specimen, recommendation to schedule Urology office visit appointment for further evaluation, recommendation to proceed to ER, etc. 2. Call your Primary Care Provider (PCP) office to request urgent / same-day visit. Be sure to request for urine culture to be ordered and have results faxed to Urology (fax # [phone]).  3. Go to urgent care. Be sure to request for urine culture to be ordered and have results faxed to Urology (fax # [phone]).   For bladder pain/ burning with urination: - Can take over-the-counter Pyridium (phenazopyridine; commonly known under the AZO brand) for a few days as needed. Limit use to no more than 3 days consecutively due to risk for methemoglobinemia, liver function issues, and bone health damage with long term use of Pyridium. - Alternative: Prescription urinary analgesics (such as Uribel, Urogesic blue, Urelle, Uro-MP). Often expensive / poorly covered by insurance unfortunately.  Options / recommendations for UTI prevention: - Low dose antibiotic daily for UTI prophylaxis. - Topical vaginal estrogen for vaginal atrophy (aka Genitourinary Syndrome of Menopause (GSM)). - Adequate daily fluid intake to flush out the urinary tract. - Go to the bathroom to urinate every 4-6 hours while awake to minimize urinary stasis / bacterial overgrowth in the bladder. - Proanthocyanidin (PAC) supplement 36 mg daily; must be soluble (insoluble form of PAC will be ineffective). Recommended brand: Ellura. This is an over-the-counter supplement (often must be found/ purchased online) supplement derived from cranberries with concentrated active component:  Proanthocyanidin (PAC) 36 mg daily. Decreases bacterial adherence to bladder lining.  - D-mannose powder (2 grams daily). This is an over-the-counter supplement which decreases bacterial adherence to bladder lining (it is a sugar that inhibits bacterial adherence to urothelial cells by binding to the pili of enteric bacteria). Take as per manufacturer recommendation. Can be used as an alternative or in addition to the concentrated cranberry supplement.  - Vitamin C supplement to acidify urine to minimize bacterial growth.  - Probiotic to maintain healthy vaginal microbiome to suppress bacteria at urethral opening. Brand recommendations: Verlon (includes probiotic & D-mannose ), Feminine Balance (highest concentration of lactobacillus) or Hyperbiotic Pro 15.  Note for patients with diabetes:  - Be aware that D-mannose contains sugar.  Note for patients with interstitial cystitis (IC):  - Patients with IC should typically avoid cranberry/ PAC supplements and Vitamin C supplements due to their acidity, which may exacerbate IC-related bladder pain. - Symptoms of true bacterial UTI can overlap / mimic symptoms of an IC flare up. Antibiotic use is NOT indicated for IC flare ups. Urine culture needed prior to antibiotic treatment for IC patients. The goal is to minimize your risk for developing antibiotic-resistant bacteria.    Vaginal atrophy I Genitourinary syndrome of menopause (GSM):  What it is: Changes in the vaginal environment (including the vulva and urethra) including: Thinning of the epithelium (skin/ mucosa surface) Can contribute to urinary urgency and frequency Can contribute to dryness, itching, irritation of the vulvar and vaginal tissue Can contribute to pain with intercourse Can contribute to physical changes of the labia, vulva, and vagina such as: Narrowing of the vaginal opening Decreased vaginal length Loss of labial architecture Labial adhesions Pale color of vulvovaginal  tissue Loss of pubic hair Allows bacteria to become adherent  Results in increased risk for urinary tract infection (UTI) due to bacterial overgrowth and migration up the urethra into the bladder Change in vaginal pH (acid/ base balance) Allows for alteration / disruption of the normal bacterial flora / microbiome Results in increased risk for urinary tract infection (UTI) due to bacterial overgrowth  Treatment options: Over-the-counter lubricants (see list below). Prescription vaginal estrogen replacement. Options: Topical vaginal estrogen (estradiol ) cream: (Estrace , Premarin, compounded) Apply as directed Estring  vaginal ring Exchanged every 3 months (either at home or in office by provider) Vagifem  vaginal tablet Inserted nightly for 2 weeks then twice a week (long term) lntrarosa vaginal suppository Vaginal DHEA: converts to estrogen in vaginal tissue without systemic effect Inserted nightly (long term) 3. Vaginal laser therapy (Mona Olam touch) Performed in 3 treatments each 6 weeks apart (available in our [place] office). Can feel like a sunburn for 3-4 days after each treatment until new skin heals in. Usually not covered by insurance. Estimated cost is $1500 for all 3 sessions.  FYI regarding prescription vaginal estrogen treatment options: - All topical vaginal estrogen replacement options are equivalent in terms of efficacy. - Topical vaginal estrogen replacement will take about 3 months to be effective. - OK to have sex with any of the topical vaginal estrogen replacement options. - Topical vaginal estrogen replacement may sting/burn initially due to severe dryness, which will improve with ongoing treatment. - Studies have demonstrated negligible systemic absorption of low-dose intravaginal estrogen cream therefore the risk for cancer development or recurrence with this medication is minimal.  Genitourinary Syndrome of Menopause: AUA/SUFU/AUGS Guideline  (2025) [employer]  Topical vaginal estrogen cream safe to use with breast cancer history [email or web address]  Topical vaginal estrogen cream safe to use with blood clot history [email or web address]   Lubricants and Moisturizers for Treating Genitourinary Syndrome of Menopause and Vulvovaginal Atrophy Treatment Comments I Available Products   Lubricants   Water -based Ingredients: Deionized water , glycerin, propylene glycol; latex safe; rare irritation; dry out with extended sexual activity Astroglide, Good Clean Love, K-Y Jelly, Natural, Organic, Pink, Sliquid, Sylk, Yes    Oil Based Ingredients: avocado, olive, peanut, corn; latex safe; can be used with silicone products; staining; safe (unless peanut allergy); non-irritating Coconut oil, vegetable oil, vitamin E oil  Silicone-Based Ingredients: Silicone polymers; staining; typically nonirritating, long lasting; waterproof; should not be used with silicone dilators, sexual toys, or gynecologic products Astroglide X, Oceanus Ultra Pure, Pink Silicone, Pjur Eros, Replens Silky Smooth, Silicone Premium JO, SKYN, Uberlube, [employer] Based Minimize harm to sperm motility; designed for couples trying to conceive:  Astroglide TTC, Conceive Plus, PreSeed, Yes Baby  Fertility Friendly Minimize harm to sperm motility; designed for couples trying to conceive:  Astroglide, TTC, Conceive Plus, PreSeed, Yes Baby  Vaginal Moisturizers   Vaginal Moisturizers For maintenance use 1 to 3 times weekly; can benefit women with dryness, chafing with AOL, and recurrent vaginal infections irrespective of sexual activity timing  Balance Active Menopause Vaginal Moisturizing Lubricant, Canesintima Intimate Moisturizer, Replens, Rephresh, Sylk Natural Intimate Moisturizer, Yes Vaginal Moisturizer  Hybrids Properties of both water  and silicone-based products (combination of a vaginal lubricant and moisturizer); Non-irritating; good option for women with allergies and sensitivities Lubrigyn, Luvena  Suppositories Hyaluronic acid to retain moisture Revaree  Vulvar Soothing Creams/Oils    Medicated Creams Pain and burn relief; Ingredients: 4% Lidocaine , Aloe Vera gel Releveum ([place])  Non-Medicated Creams For anti-itch and moisture/maintenance; Ingredients: Coconut oil, Avocado oil, Shea Butter, Olive oil, Vitamin E Vajuvenate, Vmagic  Oils  for moisture/maintenance: Coconut oil, Vitamin E oil, Emu oil     Electronically signed by:  Lauraine KYM Oz, MSN, FNP-C, CUNP 07/10/2024 12:29 PM

## 2024-07-10 ENCOUNTER — Ambulatory Visit (INDEPENDENT_AMBULATORY_CARE_PROVIDER_SITE_OTHER): Admitting: Urology

## 2024-07-10 ENCOUNTER — Encounter: Payer: Self-pay | Admitting: Urology

## 2024-07-10 VITALS — BP 95/62 | HR 112

## 2024-07-10 DIAGNOSIS — Z09 Encounter for follow-up examination after completed treatment for conditions other than malignant neoplasm: Secondary | ICD-10-CM

## 2024-07-10 DIAGNOSIS — H539 Unspecified visual disturbance: Secondary | ICD-10-CM | POA: Insufficient documentation

## 2024-07-10 DIAGNOSIS — N390 Urinary tract infection, site not specified: Secondary | ICD-10-CM

## 2024-07-10 DIAGNOSIS — Z87440 Personal history of urinary (tract) infections: Secondary | ICD-10-CM

## 2024-07-10 DIAGNOSIS — R39198 Other difficulties with micturition: Secondary | ICD-10-CM | POA: Diagnosis not present

## 2024-07-10 DIAGNOSIS — R339 Retention of urine, unspecified: Secondary | ICD-10-CM | POA: Diagnosis not present

## 2024-07-10 DIAGNOSIS — N951 Menopausal and female climacteric states: Secondary | ICD-10-CM | POA: Diagnosis not present

## 2024-07-10 DIAGNOSIS — M5412 Radiculopathy, cervical region: Secondary | ICD-10-CM | POA: Insufficient documentation

## 2024-07-10 DIAGNOSIS — N3281 Overactive bladder: Secondary | ICD-10-CM

## 2024-07-10 DIAGNOSIS — R269 Unspecified abnormalities of gait and mobility: Secondary | ICD-10-CM | POA: Insufficient documentation

## 2024-07-10 DIAGNOSIS — G8929 Other chronic pain: Secondary | ICD-10-CM | POA: Insufficient documentation

## 2024-07-10 DIAGNOSIS — Z8619 Personal history of other infectious and parasitic diseases: Secondary | ICD-10-CM

## 2024-07-10 LAB — BLADDER SCAN AMB NON-IMAGING: Scan Result: 208

## 2024-07-10 MED ORDER — NONFORMULARY OR COMPOUNDED ITEM: 99 refills | Status: DC

## 2024-07-10 MED ORDER — ESTRADIOL 0.1 MG/GM VA CREA: TOPICAL_CREAM | VAGINAL | 3 refills | Status: DC

## 2024-07-10 MED ORDER — NITROFURANTOIN MACROCRYSTAL 100 MG PO CAPS: 100.0000 mg | ORAL_CAPSULE | Freq: Every day | ORAL | 2 refills | Status: DC

## 2024-07-10 NOTE — Patient Instructions (Signed)
 UTI prevention / management:  UTI symptoms may include:  - Pain / burning / discomfort when urinating - Recent increase in urinary urgency (how quickly you feel like you need to rush to the bathroom) - Recent increase in urinary frequency (how often you are urinating) - Fever - Acute mental status change / confusion - Fatigue / Feeling tired - Weakness - Note: Urine color, clarity, and odor are not considered to be clinically significant indicators of UTI and do not warrant urine testing unless patient is also experiencing UTI symptoms such as those listed above.  Difference between Urinalysis (urine dipstick test) and Urine culture / Why urine culture often needed to determine appropriate diagnosis and treatment of urologic symptoms: > Urinalysis (urine dipstick test): A quick office test used as an indicator to determine whether or not further testing is necessary (such as a urine culture, urine microscopy, etc.) The urinalysis cannot differentiate a true bacterial UTI or give a definitive diagnosis for the findings.  > Urine culture: May be performed based on the findings of a urinalysis to evaluate for UTI. Grows out on a petri dish for 48-72 hours. Provides important information about: whether or not bacterial growth is present and if so: what the predominant bacteria is which antibiotics will work best against that bacteria That information is important so that we can diagnose and treat patients appropriately as there are other conditions which may mimic UTls which must not be missed (such as cancer, interstitial cystitis, stones, etc.). Assists us  with antibiotic stewardship to minimize patient's risk for developing antibiotic resistance (getting to a point where no antibiotics work anymore).  Options when UTI symptoms occur: 1. Call [facility] [place] and request to speak with triage nurse (phone # [phone], select option 3). In accordance with clinic guidelines  the nurse will determine next steps based on patient-reported symptoms, which may include: same-day lab visit to provide urine specimen, recommendation to schedule Urology office visit appointment for further evaluation, recommendation to proceed to ER, etc. 2. Call your Primary Care Provider (PCP) office to request urgent / same-day visit. Be sure to request for urine culture to be ordered and have results faxed to Urology (fax # [phone]).  3. Go to urgent care. Be sure to request for urine culture to be ordered and have results faxed to Urology (fax # [phone]).   For bladder pain/ burning with urination: - Can take over-the-counter Pyridium (phenazopyridine; commonly known under the AZO brand) for a few days as needed. Limit use to no more than 3 days consecutively due to risk for methemoglobinemia, liver function issues, and bone health damage with long term use of Pyridium. - Alternative: Prescription urinary analgesics (such as Uribel, Urogesic blue, Urelle, Uro-MP). Often expensive / poorly covered by insurance unfortunately.  Options / recommendations for UTI prevention: - Low dose antibiotic daily for UTI prophylaxis. - Topical vaginal estrogen for vaginal atrophy (aka Genitourinary Syndrome of Menopause (GSM)). - Adequate daily fluid intake to flush out the urinary tract. - Go to the bathroom to urinate every 4-6 hours while awake to minimize urinary stasis / bacterial overgrowth in the bladder. - Proanthocyanidin (PAC) supplement 36 mg daily; must be soluble (insoluble form of PAC will be ineffective). Recommended brand: Ellura. This is an over-the-counter supplement (often must be found/ purchased online) supplement derived from cranberries with concentrated active component: Proanthocyanidin (PAC) 36 mg daily. Decreases bacterial adherence to bladder lining.  - D-mannose powder (2 grams daily). This is an over-the-counter supplement  which decreases bacterial adherence to bladder  lining (it is a sugar that inhibits bacterial adherence to urothelial cells by binding to the pili of enteric bacteria). Take as per manufacturer recommendation. Can be used as an alternative or in addition to the concentrated cranberry supplement.  - Vitamin C supplement to acidify urine to minimize bacterial growth.  - Probiotic to maintain healthy vaginal microbiome to suppress bacteria at urethral opening. Brand recommendations: Estill Hemming (includes probiotic & D-mannose ), Feminine Balance (highest concentration of lactobacillus) or Hyperbiotic Pro 15.  Note for patients with diabetes:  - Be aware that D-mannose contains sugar.  Note for patients with interstitial cystitis (IC):  - Patients with IC should typically avoid cranberry/ PAC supplements and Vitamin C supplements due to their acidity, which may exacerbate IC-related bladder pain. - Symptoms of true bacterial UTI can overlap / mimic symptoms of an IC flare up. Antibiotic use is NOT indicated for IC flare ups. Urine culture needed prior to antibiotic treatment for IC patients. The goal is to minimize your risk for developing antibiotic-resistant bacteria.    Vaginal atrophy I Genitourinary syndrome of menopause (GSM):  What it is: Changes in the vaginal environment (including the vulva and urethra) including: Thinning of the epithelium (skin/ mucosa surface) Can contribute to urinary urgency and frequency Can contribute to dryness, itching, irritation of the vulvar and vaginal tissue Can contribute to pain with intercourse Can contribute to physical changes of the labia, vulva, and vagina such as: Narrowing of the vaginal opening Decreased vaginal length Loss of labial architecture Labial adhesions Pale color of vulvovaginal tissue Loss of pubic hair Allows bacteria to become adherent Results in increased risk for urinary tract infection (UTI) due to bacterial overgrowth and migration up the urethra into the bladder Change in  vaginal pH (acid/ base balance) Allows for alteration / disruption of the normal bacterial flora / microbiome Results in increased risk for urinary tract infection (UTI) due to bacterial overgrowth  Treatment options: Over-the-counter lubricants (see list below). Prescription vaginal estrogen replacement. Options: Topical vaginal estrogen (estradiol ) cream: (Estrace , Premarin, compounded) Apply as directed Estring  vaginal ring Exchanged every 3 months (either at home or in office by provider) Vagifem  vaginal tablet Inserted nightly for 2 weeks then twice a week (long term) lntrarosa vaginal suppository Vaginal DHEA: converts to estrogen in vaginal tissue without systemic effect Inserted nightly (long term) 3. Vaginal laser therapy (Mona Edwina Gram touch) Performed in 3 treatments each 6 weeks apart (available in our [place] office). Can feel like a sunburn for 3-4 days after each treatment until new skin heals in. Usually not covered by insurance. Estimated cost is $1500 for all 3 sessions.  FYI regarding prescription vaginal estrogen treatment options: - All topical vaginal estrogen replacement options are equivalent in terms of efficacy. - Topical vaginal estrogen replacement will take about 3 months to be effective. - OK to have sex with any of the topical vaginal estrogen replacement options. - Topical vaginal estrogen replacement may sting/burn initially due to severe dryness, which will improve with ongoing treatment. - Studies have demonstrated negligible systemic absorption of low-dose intravaginal estrogen cream therefore the risk for cancer development or recurrence with this medication is minimal.  Genitourinary Syndrome of Menopause: AUA/SUFU/AUGS Guideline (2025) [place]  Topical vaginal estrogen cream safe to use with breast cancer  history [email or web address]  Topical vaginal estrogen cream safe to use with blood clot history [email or web address]   Lubricants and Moisturizers for Treating Genitourinary Syndrome of Menopause and Vulvovaginal Atrophy Treatment Comments I  Available Products   Lubricants   Water-based Ingredients: Deionized water, glycerin, propylene glycol; latex safe; rare irritation; dry out with extended sexual activity Astroglide, Good Clean Love, K-Y Jelly, Natural, Organic, Pink, Sliquid, Sylk, Yes    Oil Based Ingredients: avocado, olive, peanut, corn; latex safe; can be used with silicone products; staining; safe (unless peanut allergy); non-irritating Coconut oil, vegetable oil, vitamin E oil  Silicone-Based Ingredients: Silicone polymers; staining; typically nonirritating, long lasting; waterproof; should not be used with silicone dilators, sexual toys, or gynecologic products Astroglide X, Oceanus Ultra Pure, Pink Silicone, Pjur Eros, Replens Silky Smooth, Silicone Premium JO, SKYN, Uberlube, [employer] Based Minimize harm to sperm motility; designed for couples trying to conceive:  Astroglide TTC, Conceive Plus, PreSeed, Yes Baby  Fertility Friendly Minimize harm to sperm motility; designed for couples trying to conceive:  Astroglide, TTC, Conceive Plus, PreSeed, Yes Baby  Vaginal Moisturizers   Vaginal Moisturizers For maintenance use 1 to 3 times weekly; can benefit women with dryness, chafing with AOL, and recurrent vaginal infections irrespective of sexual activity timing Balance Active Menopause Vaginal Moisturizing Lubricant, Canesintima Intimate Moisturizer, Replens, Rephresh, Sylk Natural Intimate Moisturizer, Yes Vaginal  Moisturizer  Hybrids Properties of both water and silicone-based products (combination of a vaginal lubricant and moisturizer); Non-irritating; good option for women with allergies and sensitivities Lubrigyn, Luvena  Suppositories Hyaluronic acid to retain moisture Revaree  Vulvar Soothing Creams/Oils    Medicated Creams Pain and burn relief; Ingredients: 4% Lidocaine, Aloe Vera gel Releveum ([place])  Non-Medicated Creams For anti-itch and moisture/maintenance; Ingredients: Coconut oil, Avocado oil, Shea Butter, Olive oil, Vitamin E Vajuvenate, Vmagic  Oils for moisture/maintenance: Coconut oil, Vitamin E oil, Emu oil

## 2024-07-11 ENCOUNTER — Other Ambulatory Visit (HOSPITAL_COMMUNITY): Payer: Self-pay | Admitting: Nurse Practitioner

## 2024-07-11 ENCOUNTER — Ambulatory Visit (HOSPITAL_COMMUNITY)
Admission: RE | Admit: 2024-07-11 | Discharge: 2024-07-11 | Disposition: A | Discharge status: Home / Self Care | Source: Ambulatory Visit | Attending: Nurse Practitioner | Admitting: Nurse Practitioner

## 2024-07-11 ENCOUNTER — Telehealth: Payer: Self-pay

## 2024-07-11 DIAGNOSIS — S8992XD Unspecified injury of left lower leg, subsequent encounter: Secondary | ICD-10-CM

## 2024-07-11 DIAGNOSIS — M542 Cervicalgia: Secondary | ICD-10-CM

## 2024-07-11 DIAGNOSIS — J9621 Acute and chronic respiratory failure with hypoxia: Secondary | ICD-10-CM | POA: Diagnosis not present

## 2024-07-11 DIAGNOSIS — I69398 Other sequelae of cerebral infarction: Secondary | ICD-10-CM | POA: Diagnosis not present

## 2024-07-11 DIAGNOSIS — E1142 Type 2 diabetes mellitus with diabetic polyneuropathy: Secondary | ICD-10-CM | POA: Diagnosis not present

## 2024-07-11 DIAGNOSIS — I700 Atherosclerosis of aorta: Secondary | ICD-10-CM | POA: Diagnosis not present

## 2024-07-11 DIAGNOSIS — Z7689 Persons encountering health services in other specified circumstances: Secondary | ICD-10-CM | POA: Diagnosis not present

## 2024-07-11 DIAGNOSIS — J441 Chronic obstructive pulmonary disease with (acute) exacerbation: Secondary | ICD-10-CM | POA: Diagnosis not present

## 2024-07-11 DIAGNOSIS — L89152 Pressure ulcer of sacral region, stage 2: Secondary | ICD-10-CM | POA: Diagnosis not present

## 2024-07-11 NOTE — Telephone Encounter (Signed)
 Pharmacist called to confirm pt is to finish taking macrobid  and then start macrodantin  I advised per protocol yes once pt finished her macrobid  she is to start her macrodantin  pharmacist voiced her understanding

## 2024-07-14 NOTE — Progress Notes (Unsigned)
 [name] White, female    DOB: Feb 07, 1941    MRN: [id number]   Brief patient profile:  65  yowf never smoker with dementia  referred to pulmonary clinic in [place]  07/16/2024 by Dr Maree with [facility]/p Admit:   Pt not previously seen by PCCM service.     Admit date:     05/22/2024  Discharge date: 05/28/2024      Recommendations at discharge:   Please follow up with primary care provider within 1-2 weeks  Please repeat BMP and CBC in one week       Hospital Course: 83 year old female with a history of diabetes mellitus type 2, stroke, hypertension, COPD, hypothyroidism, hyperlipidemia, anxiety, PSVT presenting with coughing, chest congestion, shortness of breath.  The patient denies any fevers, chills, chest pain, nausea, vomiting or diarrhea.  She denies any hemoptysis.  She was recently admitted to the hospital from 05/10/2024 to 05/15/2024.  During that hospitalization, the patient was treated for COPD exacerbation and what was felt to be atypical pneumonia.  She was discharged with a prednisone  taper.  In addition, the patient was also admitted from 04/24/2024 to 05/01/2024 with septic shock secondary pneumonia. She has had increasing generalized weakness since discharge from the hospital.  There is no hematochezia or melena.  She does complain of some dysuria for the past week. In the ED, the patient was afebrile with soft blood pressures in the low 90s and upper 80s.  WBC 17.1, hemoglobin 12.5, platelet 236.  Sodium 130, potassium 4.2, bicarbonate 26, serum creatinine 0.82.  LFTs were unremarkable.  Chest x-ray showed chronic interstitial markings.  Lactic acid 2.8.  The patient was started on linezolid  and cefepime .   Assessment and Plan:   Severe sepsis - Presented with leukocytosis, lactic acid 2.8, low BPs - Secondary to pneumonia and UTI - 05/10/24 UA >50 WBC - 05/10/24 urine culture = VRE - Continue linezolid --completed 6 days during hospitalization - continue  cefepime --completed 6 days during hospitalization - Continue azithromycin --completed 7 days during hospitalization - remainedon levophed  1 mg/kg/min tansiently  - increased midodrine  to 10 mg tid--continue after d/c - received short course florinef  0.1 mg - sepsis physiology resolved   VRE UTI - Continue linezolid --completed 6 days during hospitalizaiton   Acute respiratory failure with hypoxia - Secondary to COPD exacerbation, pneumonia - Presented with oxygen saturation 88% on room air - Stable on 2 L>>weaned to RA and remained stable - Wean oxygen as tolerated - given lasix  IV x 1>>start lasix  40 mg po daily>>hold now since BPs remain labile - COVID/RSV/Flu--neg - viral respiratory panel--neg - CTA chest--neg PE;  scattered GGO bilateral UL.  Bibasilar patchy consolidation - restart breo after d/c;  add incruse after d/c - d/c with cefdinir  x 2 more days after d/c   COPD exacerbation - Continue Solu-Medrol >>wean to prednisone  6/1 - Continue DuoNebs - Continue Brovana  - Continue Pulmicort  - continue Yupelri  - restart breo after d/c;  add incruse after d/c   Hyperosmolar nonketotic state -due to steroids -serum glucose up to 687 -5/29 started insulin  drip>>transition to [facility] insulin  5/30 -start semglee  10 units and Novolog  q 4 hour -add novolog  6 units with meals -advised family not to bring outside food - CBGs improved with de-escalation of steroids   Hyperlipidemia - Continue statin   Controlled diabetes mellitus type 2 with hyperglycemia - 04/25/2024 hemoglobin A1c 7.0 - Holding metformin --restart after d/c - NovoLog  sliding scale   Chronic pain - Continue Lyrica    History  of stroke - Continue aspirin , Plavix , statin   Depression/anxiety - Hold Trintellix  and venlafaxine  temporarily while getting linezolid  -restart after d/c   Hypothyroidism - Continue Synthroid    Aspiration pneumonitis -continue abx -speech therapy eval appreciated>>dys 3 with thin    Hypomagnesemia/hypophosphatemia -repleted   History of Present Illness  07/16/2024  Pulmonary/ 1st office eval/ [name] / [place] [facility] - s/p resp failure from pna ? Asp rec was  D3 diet per ST on 04/29/24,  but on regular diet at [facility]  Chief Complaint  Patient presents with   Establish Care  Dyspnea:  transfer bed to w/c to recliner - still very sedentary  Cough: none / swallowing ok per staff  Sleep: flat bed wedge pillow  SABA use: multiple including breo / incruse / duoneb  02: none     No obvious day to day or daytime pattern/variability or assoc excess/ purulent sputum or mucus plugs or hemoptysis or cp or chest tightness, subjective wheeze or overt sinus or hb symptoms.    Also denies any obvious fluctuation of symptoms with weather or environmental changes or other aggravating or alleviating factors except as outlined above   No unusual exposure hx or h/o childhood pna/ asthma or knowledge of premature birth.  Current Allergies, Complete Past Medical History, Past Surgical History, Family History, and Social History were reviewed in [employer] record.  ROS  The following are not active complaints unless bolded Hoarseness, sore throat, dysphagia, dental problems, itching, sneezing,  nasal congestion or discharge of excess mucus or purulent secretions, ear ache,   fever, chills, sweats, unintended wt loss or wt gain, classically pleuritic or exertional cp,  orthopnea pnd or arm/hand swelling  or leg swelling, presyncope, palpitations, abdominal pain, anorexia, nausea, vomiting, diarrhea  or change in bowel habits or change in bladder habits, change in stools or change in urine, dysuria, hematuria,  rash, arthralgias, visual complaints, headache, numbness, weakness or ataxia or problems with walking or coordination,  change in mood or  memory.            Outpatient Medications Prior to Visit  Medication Sig Dispense Refill   acetaminophen  (TYLENOL ) 325 MG  tablet Take 2 tablets (650 mg total) by mouth every 6 (six) hours as needed for mild pain (pain score 1-3) (or Fever >/= 101). 30 tablet 0   albuterol  (VENTOLIN  HFA) 108 (90 Base) MCG/ACT inhaler Inhale 2 puffs into the lungs every 4 (four) hours as needed for wheezing or shortness of breath. 8 g 3   aspirin  EC 81 MG tablet Take 81 mg by mouth daily. Swallow whole.     atorvastatin  (LIPITOR) 40 MG tablet Take 2 tablets (80 mg total) by mouth daily. 60 tablet 2   dextromethorphan  (DELSYM ) 30 MG/5ML liquid Take 5 mLs (30 mg total) by mouth 2 (two) times daily as needed for cough. 89 mL 0   fluticasone  furoate-vilanterol (BREO ELLIPTA ) 200-25 MCG/ACT AEPB Inhale 1 puff into the lungs daily. 30 each 1   furosemide  (LASIX ) 20 MG tablet Take 1 tablet (20 mg total) by mouth daily. 30 tablet 0   ipratropium-albuterol  (DUONEB) 0.5-2.5 (3) MG/3ML SOLN Take 3 mLs by nebulization every 4 (four) hours as needed (wheezing, shortness of breath). 360 mL 1   levothyroxine  (SYNTHROID ) 50 MCG tablet Take 50 mcg by mouth daily.     metFORMIN  (GLUCOPHAGE ) 500 MG tablet Take 500 mg by mouth 2 (two) times daily.     midodrine  (PROAMATINE ) 5 MG tablet Take  5 mg by mouth.     nitrofurantoin  (MACRODANTIN ) 100 MG capsule Take 1 capsule (100 mg total) by mouth daily. 30 capsule 2   omeprazole (PRILOSEC) 20 MG capsule Take 20 mg by mouth every morning.     potassium chloride  (KLOR-CON ) 10 MEQ tablet Take 10 mEq by mouth daily.     pregabalin  (LYRICA ) 150 MG capsule Take 1 capsule (150 mg total) by mouth 2 (two) times daily. 60 capsule 0   rizatriptan  (MAXALT ) 10 MG tablet Take 10 mg by mouth every 2 (two) hours as needed for migraine. No more than 2 doses in 24 hours     tiZANidine (ZANAFLEX) 2 MG tablet Take 2 mg by mouth 2 (two) times daily.     traZODone  (DESYREL ) 50 MG tablet Take 1 tablet (50 mg total) by mouth at bedtime as needed for sleep. 30 tablet 0   TRINTELLIX  10 MG TABS tablet Take 10 mg by mouth daily.      umeclidinium bromide  (INCRUSE ELLIPTA ) 62.5 MCG/ACT AEPB Inhale 1 puff into the lungs daily. 1 each 1   venlafaxine  (EFFEXOR -XR) 150 MG 24 hr capsule Take 150 mg by mouth every morning.     clotrimazole (LOTRIMIN) 1 % cream Apply 1 Application topically 2 (two) times daily.     cycloSPORINE  (RESTASIS ) 0.05 % ophthalmic emulsion Place 1 drop into both eyes 2 (two) times daily.     estradiol  (ESTRACE ) 0.1 MG/GM vaginal cream Discard plastic applicator. SNF staff are to place 1 gram (approximately a blueberry-sized amount) of estrogen cream on a gloved fingertip then apply the medication by gently inserting same finger approximately 1-2 cm inside patient's vaginal opening once per night at bedtime. For long term use. 30 g 3   Multiple Vitamins-Minerals (THERA-M) TABS Take 1 tablet by mouth daily.     NONFORMULARY OR COMPOUNDED ITEM If patient is incontinent of urine and/or stool, SNF staff are instructed to cleanse patient's genital area at least once every 8 hours and change diaper if soiled. 1 each 99   NONFORMULARY OR COMPOUNDED ITEM SNF staff are instructed to encourage hydration with water  at least once every 2-4 hours while patient is awake. 1 each 99   NONFORMULARY OR COMPOUNDED ITEM SNF staff are instructed to prompt toileting and assist patient with ambulation to toilet at least once every 4-6 hours while awake. 1 each 99   polyethylene glycol (MIRALAX  / GLYCOLAX ) 17 g packet Take 17 g by mouth daily as needed for mild constipation. 30 each 0   senna (SENOKOT) 8.6 MG TABS tablet Take 2 tablets by mouth at bedtime.     No facility-administered medications prior to visit.    Past Medical History:  Diagnosis Date   Anemia    Anxiety    Asthma    ASYMPTOMATIC POSTMENOPAUSAL STATUS 02/09/2009   Cataract    Cerebral infarction [facility])    Constipation 03/19/2013   Depression    DIABETES MELLITUS, TYPE II 09/14/2007   HYPERCHOLESTEROLEMIA 02/09/2009   HYPERTENSION 02/09/2009   HYPOTHYROIDISM  09/14/2007   Metabolic encephalopathy    MIGRAINE HEADACHE 09/14/2007   Neuropathy    OSTEOPOROSIS 02/09/2009   PANCREATITIS 09/14/2007   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   PONV (postoperative nausea and vomiting)    Protein calorie malnutrition (HCC)    Rectocele 03/19/2013   Shortness of breath    SUPERFICIAL PHLEBITIS 09/14/2007   Varicose veins       Objective:     BP 105/68   Pulse 93  Ht 5' 5 (1.651 m)   Wt 130 lb (59 kg)   SpO2 97% Comment: ra  BMI 21.63 kg/m   SpO2: 97 % (ra)   W/c bound frail elderly wf  / dry cough on voluntary maneuver only   HEENT : Oropharynx  clear      Nasal turbinates nl    NECK :  without  apparent JVD/ palpable Nodes/TM    LUNGS: no acc muscle use,  Nl contour chest which is clear to A and P bilaterally without cough on insp or exp maneuvers   CV:  RRR  no s3 or murmur or increase in P2, and no edema   ABD:  soft and nontender   MS:    ext warm without deformities Or obvious joint restrictions  calf tenderness, cyanosis or clubbing    SKIN: warm and dry without lesions    NEURO:  alert, approp, nl sensorium with  no motor or cerebellar deficits apparent.    I personally reviewed images and agree with radiology impression as follows:   Chest CTa   03/27/24 1.    Negative for pulmonary embolism.  2.    No acute pulmonary pathology.     I personally reviewed images and agree with radiology impression as follows:  CXR:   portable 05/29/24 Lower lung volumes with elevation of the right hemidiaphragm. No focal airspace consolidation, pleural effusion, or pneumothorax. Mild cardiomegaly. Tortuous aorta with aortic atherosclerosis. No acute fracture or destructive lesion. Osteopenia. Bilateral AC joint osteoarthritis. Multilevel thoracic osteophytosis. My review:  cardiomegaly looks moderate to me but no ILD / edema Assessment   Acute respiratory failure with hypoxemia (HCC) Never smoker  - See admit 05/22/24 likely related to  aspiration vs ALI from uti sepsis  - d/c'd 05/27/24 on macrodantin   - try off BREO / incruse and just use duoneb prn 07/16/2024 >>>   She has clear lungs today and no   prior hx that would suggest any form of lung dz specifically certainly not COPD given never smoker status  - should be able to stop maint rx and just use duoneb prn while watching 02 sats and doe as possible indicators of evolving airways ro ILD (with the latter worrisome for macrodantin  effects vs ALI from recurrent UTI sepsis)   Advised staff also to be aware of risk of recurrent asp with freq re-eval by speech therapy if any problems with meal assoc coughing.  Given overall geriatric decline with limited benefit from specialty care for aspirations issues from a pulmonary perspective will just see her back here prn         Each maintenance medication was reviewed in detail including emphasizing most importantly the difference between maintenance and prns and under what circumstances the prns are to be triggered using an action plan format where appropriate.  Total time for H and P, chart review, counseling, reviewing dpi/ neb  device(s) and generating customized AVS unique to this office visit / same day charting = 48 min complex new pt eval           Ozell America, MD 07/16/2024

## 2024-07-15 DIAGNOSIS — L89152 Pressure ulcer of sacral region, stage 2: Secondary | ICD-10-CM | POA: Diagnosis not present

## 2024-07-15 DIAGNOSIS — I69398 Other sequelae of cerebral infarction: Secondary | ICD-10-CM | POA: Diagnosis not present

## 2024-07-15 DIAGNOSIS — I700 Atherosclerosis of aorta: Secondary | ICD-10-CM | POA: Diagnosis not present

## 2024-07-15 DIAGNOSIS — E1142 Type 2 diabetes mellitus with diabetic polyneuropathy: Secondary | ICD-10-CM | POA: Diagnosis not present

## 2024-07-15 DIAGNOSIS — J441 Chronic obstructive pulmonary disease with (acute) exacerbation: Secondary | ICD-10-CM | POA: Diagnosis not present

## 2024-07-15 DIAGNOSIS — J9621 Acute and chronic respiratory failure with hypoxia: Secondary | ICD-10-CM | POA: Diagnosis not present

## 2024-07-16 ENCOUNTER — Ambulatory Visit: Admitting: Internal Medicine

## 2024-07-16 ENCOUNTER — Other Ambulatory Visit: Payer: Self-pay | Admitting: Internal Medicine

## 2024-07-16 ENCOUNTER — Encounter: Payer: Self-pay | Admitting: Internal Medicine

## 2024-07-16 VITALS — BP 105/68 | HR 93 | Ht 65.0 in | Wt 130.0 lb

## 2024-07-16 DIAGNOSIS — J9601 Acute respiratory failure with hypoxia: Secondary | ICD-10-CM

## 2024-07-16 DIAGNOSIS — Z87891 Personal history of nicotine dependence: Secondary | ICD-10-CM | POA: Diagnosis not present

## 2024-07-16 DIAGNOSIS — L89152 Pressure ulcer of sacral region, stage 2: Secondary | ICD-10-CM | POA: Diagnosis not present

## 2024-07-16 DIAGNOSIS — I69398 Other sequelae of cerebral infarction: Secondary | ICD-10-CM | POA: Diagnosis not present

## 2024-07-16 DIAGNOSIS — J441 Chronic obstructive pulmonary disease with (acute) exacerbation: Secondary | ICD-10-CM | POA: Diagnosis not present

## 2024-07-16 DIAGNOSIS — J9621 Acute and chronic respiratory failure with hypoxia: Secondary | ICD-10-CM | POA: Diagnosis not present

## 2024-07-16 DIAGNOSIS — J45909 Unspecified asthma, uncomplicated: Secondary | ICD-10-CM

## 2024-07-16 DIAGNOSIS — I700 Atherosclerosis of aorta: Secondary | ICD-10-CM | POA: Diagnosis not present

## 2024-07-16 DIAGNOSIS — E1142 Type 2 diabetes mellitus with diabetic polyneuropathy: Secondary | ICD-10-CM | POA: Diagnosis not present

## 2024-07-16 MED ORDER — IPRATROPIUM-ALBUTEROL 0.5-2.5 (3) MG/3ML IN SOLN: 3.0000 mL | Freq: Once | RESPIRATORY_TRACT | Status: DC

## 2024-07-16 NOTE — Patient Instructions (Addendum)
 If oxygen levels are declining on macrodantin  it needs to be stopped  Stop incruse and BREO and albuterol    Change duob to four times daily as needed and if you find need it around the clock then can always restart the BREO 100 one click each am   Follow up here is as needed

## 2024-07-16 NOTE — Assessment & Plan Note (Signed)
 Never smoker  - See admit 05/22/24 likely related to aspiration vs ALI from uti sepsis  - d/c'd 05/27/24 on macrodantin   - try off BREO / incruse and just use duoneb prn 07/16/2024 >>>   She has clear lungs today and no h/o prior hx that would suggest any form of lung dz specifically certainly not COPD given never smoker status  - should be able to stop maint rx and just use duoneb prn while watching 02 sats and doe as possible indicators of evolviign airways ro ILD (with the latter worrisome for macrodantin  effects vs ALI from recurrent UTI sepsis)

## 2024-07-17 ENCOUNTER — Ambulatory Visit: Admitting: Neurology

## 2024-07-17 ENCOUNTER — Encounter: Payer: Self-pay | Admitting: Internal Medicine

## 2024-07-17 DIAGNOSIS — R4182 Altered mental status, unspecified: Secondary | ICD-10-CM

## 2024-07-17 DIAGNOSIS — G3184 Mild cognitive impairment, so stated: Secondary | ICD-10-CM

## 2024-07-18 ENCOUNTER — Telehealth: Payer: Self-pay

## 2024-07-18 ENCOUNTER — Other Ambulatory Visit: Payer: Self-pay | Admitting: Urology

## 2024-07-18 DIAGNOSIS — J9621 Acute and chronic respiratory failure with hypoxia: Secondary | ICD-10-CM | POA: Diagnosis not present

## 2024-07-18 DIAGNOSIS — N950 Postmenopausal bleeding: Secondary | ICD-10-CM

## 2024-07-18 DIAGNOSIS — J441 Chronic obstructive pulmonary disease with (acute) exacerbation: Secondary | ICD-10-CM | POA: Diagnosis not present

## 2024-07-18 DIAGNOSIS — L89152 Pressure ulcer of sacral region, stage 2: Secondary | ICD-10-CM | POA: Diagnosis not present

## 2024-07-18 DIAGNOSIS — E1142 Type 2 diabetes mellitus with diabetic polyneuropathy: Secondary | ICD-10-CM | POA: Diagnosis not present

## 2024-07-18 DIAGNOSIS — I700 Atherosclerosis of aorta: Secondary | ICD-10-CM | POA: Diagnosis not present

## 2024-07-18 DIAGNOSIS — I69398 Other sequelae of cerebral infarction: Secondary | ICD-10-CM | POA: Diagnosis not present

## 2024-07-18 NOTE — Telephone Encounter (Signed)
 Return call, spoke to [place] whom confirmed pt has some vaginal bleeding. Kyra is made aware a message will be sent to Lauraine on advisement. Kyra voiced understanding.

## 2024-07-18 NOTE — Telephone Encounter (Signed)
 Patient has started using estrogen cream. Has medium vaginal bleeding for 2 days.    Please advise.   Nichole - [phone]

## 2024-07-18 NOTE — Telephone Encounter (Signed)
 Candance office manager was made aware. Needs eval by GYN provider Candance wants to know if pt need a referral for GYN. Candance is aware a message will be sent to Lauraine on advisement.

## 2024-07-22 NOTE — Telephone Encounter (Signed)
 Tammy office manager was made aware and voiced understanding GYN referral placed

## 2024-07-23 DIAGNOSIS — I69398 Other sequelae of cerebral infarction: Secondary | ICD-10-CM | POA: Diagnosis not present

## 2024-07-23 DIAGNOSIS — J9621 Acute and chronic respiratory failure with hypoxia: Secondary | ICD-10-CM | POA: Diagnosis not present

## 2024-07-23 DIAGNOSIS — J441 Chronic obstructive pulmonary disease with (acute) exacerbation: Secondary | ICD-10-CM | POA: Diagnosis not present

## 2024-07-23 DIAGNOSIS — I700 Atherosclerosis of aorta: Secondary | ICD-10-CM | POA: Diagnosis not present

## 2024-07-23 DIAGNOSIS — E1142 Type 2 diabetes mellitus with diabetic polyneuropathy: Secondary | ICD-10-CM | POA: Diagnosis not present

## 2024-07-23 DIAGNOSIS — L89152 Pressure ulcer of sacral region, stage 2: Secondary | ICD-10-CM | POA: Diagnosis not present

## 2024-07-25 ENCOUNTER — Ambulatory Visit

## 2024-07-25 DIAGNOSIS — R339 Retention of urine, unspecified: Secondary | ICD-10-CM

## 2024-07-25 DIAGNOSIS — N390 Urinary tract infection, site not specified: Secondary | ICD-10-CM | POA: Diagnosis not present

## 2024-07-25 LAB — URINALYSIS, ROUTINE W REFLEX MICROSCOPIC
Bilirubin, UA: NEGATIVE
Glucose, UA: NEGATIVE
Ketones, UA: NEGATIVE
Leukocytes,UA: NEGATIVE
Nitrite, UA: NEGATIVE
Protein,UA: NEGATIVE
RBC, UA: NEGATIVE
Specific Gravity, UA: 1.01 (ref 1.005–1.030)
Urobilinogen, Ur: 0.2 mg/dL (ref 0.2–1.0)
pH, UA: 5.5 (ref 5.0–7.5)

## 2024-07-25 NOTE — Progress Notes (Signed)
 Bladder Scan completed today.  Patient can void prior to the bladder scan. Bladder scan result: 26  Performed By: Exie DASEN. CMA  Additional notes- per NP ua came back Clear prior ov notes attached to AVS for abx details

## 2024-07-29 DIAGNOSIS — I700 Atherosclerosis of aorta: Secondary | ICD-10-CM | POA: Diagnosis not present

## 2024-07-29 DIAGNOSIS — J441 Chronic obstructive pulmonary disease with (acute) exacerbation: Secondary | ICD-10-CM | POA: Diagnosis not present

## 2024-07-29 DIAGNOSIS — J9621 Acute and chronic respiratory failure with hypoxia: Secondary | ICD-10-CM | POA: Diagnosis not present

## 2024-07-29 DIAGNOSIS — L89152 Pressure ulcer of sacral region, stage 2: Secondary | ICD-10-CM | POA: Diagnosis not present

## 2024-07-29 DIAGNOSIS — E1142 Type 2 diabetes mellitus with diabetic polyneuropathy: Secondary | ICD-10-CM | POA: Diagnosis not present

## 2024-07-29 DIAGNOSIS — I69398 Other sequelae of cerebral infarction: Secondary | ICD-10-CM | POA: Diagnosis not present

## 2024-07-29 DIAGNOSIS — G9349 Other encephalopathy: Secondary | ICD-10-CM | POA: Diagnosis not present

## 2024-08-06 DIAGNOSIS — L89152 Pressure ulcer of sacral region, stage 2: Secondary | ICD-10-CM | POA: Diagnosis not present

## 2024-08-06 DIAGNOSIS — I69398 Other sequelae of cerebral infarction: Secondary | ICD-10-CM | POA: Diagnosis not present

## 2024-08-06 DIAGNOSIS — J9621 Acute and chronic respiratory failure with hypoxia: Secondary | ICD-10-CM | POA: Diagnosis not present

## 2024-08-06 DIAGNOSIS — J441 Chronic obstructive pulmonary disease with (acute) exacerbation: Secondary | ICD-10-CM | POA: Diagnosis not present

## 2024-08-06 DIAGNOSIS — G9349 Other encephalopathy: Secondary | ICD-10-CM | POA: Diagnosis not present

## 2024-08-06 DIAGNOSIS — E1142 Type 2 diabetes mellitus with diabetic polyneuropathy: Secondary | ICD-10-CM | POA: Diagnosis not present

## 2024-08-12 DIAGNOSIS — M542 Cervicalgia: Secondary | ICD-10-CM | POA: Diagnosis not present

## 2024-08-12 DIAGNOSIS — I700 Atherosclerosis of aorta: Secondary | ICD-10-CM | POA: Diagnosis not present

## 2024-08-12 DIAGNOSIS — M9902 Segmental and somatic dysfunction of thoracic region: Secondary | ICD-10-CM | POA: Diagnosis not present

## 2024-08-12 DIAGNOSIS — L89152 Pressure ulcer of sacral region, stage 2: Secondary | ICD-10-CM | POA: Diagnosis not present

## 2024-08-12 DIAGNOSIS — M9901 Segmental and somatic dysfunction of cervical region: Secondary | ICD-10-CM | POA: Diagnosis not present

## 2024-08-12 DIAGNOSIS — G9349 Other encephalopathy: Secondary | ICD-10-CM | POA: Diagnosis not present

## 2024-08-12 DIAGNOSIS — E1142 Type 2 diabetes mellitus with diabetic polyneuropathy: Secondary | ICD-10-CM | POA: Diagnosis not present

## 2024-08-12 DIAGNOSIS — J441 Chronic obstructive pulmonary disease with (acute) exacerbation: Secondary | ICD-10-CM | POA: Diagnosis not present

## 2024-08-12 DIAGNOSIS — I69398 Other sequelae of cerebral infarction: Secondary | ICD-10-CM | POA: Diagnosis not present

## 2024-08-12 DIAGNOSIS — M546 Pain in thoracic spine: Secondary | ICD-10-CM | POA: Diagnosis not present

## 2024-08-12 DIAGNOSIS — J9621 Acute and chronic respiratory failure with hypoxia: Secondary | ICD-10-CM | POA: Diagnosis not present

## 2024-08-15 DIAGNOSIS — G9349 Other encephalopathy: Secondary | ICD-10-CM | POA: Diagnosis not present

## 2024-08-15 DIAGNOSIS — L89152 Pressure ulcer of sacral region, stage 2: Secondary | ICD-10-CM | POA: Diagnosis not present

## 2024-08-15 DIAGNOSIS — J9621 Acute and chronic respiratory failure with hypoxia: Secondary | ICD-10-CM | POA: Diagnosis not present

## 2024-08-15 DIAGNOSIS — I69398 Other sequelae of cerebral infarction: Secondary | ICD-10-CM | POA: Diagnosis not present

## 2024-08-15 DIAGNOSIS — I700 Atherosclerosis of aorta: Secondary | ICD-10-CM | POA: Diagnosis not present

## 2024-08-15 DIAGNOSIS — E1142 Type 2 diabetes mellitus with diabetic polyneuropathy: Secondary | ICD-10-CM | POA: Diagnosis not present

## 2024-08-15 DIAGNOSIS — J441 Chronic obstructive pulmonary disease with (acute) exacerbation: Secondary | ICD-10-CM | POA: Diagnosis not present

## 2024-08-16 ENCOUNTER — Encounter: Payer: Self-pay | Admitting: Obstetrics and Gynecology

## 2024-08-16 ENCOUNTER — Ambulatory Visit (INDEPENDENT_AMBULATORY_CARE_PROVIDER_SITE_OTHER): Admitting: Obstetrics and Gynecology

## 2024-08-16 VITALS — BP 80/20 | HR 100 | Wt 131.8 lb

## 2024-08-16 DIAGNOSIS — N950 Postmenopausal bleeding: Secondary | ICD-10-CM | POA: Diagnosis not present

## 2024-08-16 DIAGNOSIS — E2839 Other primary ovarian failure: Secondary | ICD-10-CM

## 2024-08-16 DIAGNOSIS — Z1231 Encounter for screening mammogram for malignant neoplasm of breast: Secondary | ICD-10-CM

## 2024-08-19 DIAGNOSIS — I69398 Other sequelae of cerebral infarction: Secondary | ICD-10-CM | POA: Diagnosis not present

## 2024-08-19 DIAGNOSIS — M542 Cervicalgia: Secondary | ICD-10-CM | POA: Diagnosis not present

## 2024-08-19 DIAGNOSIS — G9349 Other encephalopathy: Secondary | ICD-10-CM | POA: Diagnosis not present

## 2024-08-19 DIAGNOSIS — M9901 Segmental and somatic dysfunction of cervical region: Secondary | ICD-10-CM | POA: Diagnosis not present

## 2024-08-19 DIAGNOSIS — L89152 Pressure ulcer of sacral region, stage 2: Secondary | ICD-10-CM | POA: Diagnosis not present

## 2024-08-19 DIAGNOSIS — M546 Pain in thoracic spine: Secondary | ICD-10-CM | POA: Diagnosis not present

## 2024-08-19 DIAGNOSIS — M9902 Segmental and somatic dysfunction of thoracic region: Secondary | ICD-10-CM | POA: Diagnosis not present

## 2024-08-19 DIAGNOSIS — I700 Atherosclerosis of aorta: Secondary | ICD-10-CM | POA: Diagnosis not present

## 2024-08-19 DIAGNOSIS — E1142 Type 2 diabetes mellitus with diabetic polyneuropathy: Secondary | ICD-10-CM | POA: Diagnosis not present

## 2024-08-19 DIAGNOSIS — J441 Chronic obstructive pulmonary disease with (acute) exacerbation: Secondary | ICD-10-CM | POA: Diagnosis not present

## 2024-08-19 DIAGNOSIS — J9621 Acute and chronic respiratory failure with hypoxia: Secondary | ICD-10-CM | POA: Diagnosis not present

## 2024-08-20 ENCOUNTER — Other Ambulatory Visit: Payer: Self-pay | Admitting: Neurology

## 2024-08-21 NOTE — Progress Notes (Signed)
   Acute Office Visit  Subjective:    Patient ID: [name] White, female    DOB: 29-Nov-1941, 83 y.o.   MRN: [id number]   HPI 83 y.o. presents today for Gynecologic Exam (Referral for PMB/Has had 2 episodes of bleeding. The end of June was the 1X and the bleeding was very bad. The 2 time was mid July. She only bleed 1 day. She had fallen a wk to 2 wks before this happened. She has been dealing with a lot of UTIs. Has been in and out of the hospital.  /Mammo.-02/15/22/Colonoscopy-12/26/10) .  No LMP recorded. Patient has had a hysterectomy.    Review of Systems     Objective:    OBGyn Exam  BP (!) 80/20   Pulse 100   Wt 131 lb 12.8 oz (59.8 kg)   SpO2 (!) 87%   BMI 21.93 kg/m  Wt Readings from Last 3 Encounters:  08/16/24 131 lb 12.8 oz (59.8 kg)  07/16/24 130 lb (59 kg)  07/04/24 130 lb 1.1 oz (59 kg)       PMB Assessment & Plan:  PMB High risk for falls   Counseled on concern for endometrial cancer with PMB.  Discussed need for EMB, pap smear, cultures and PUS.  Counseled on the EMB procedure and what to expect.  She will take tylenol  before the procedure and will return for these. Dxa scan placed  30 minutes spent on reviewing records, imaging,  and one on one patient time and counseling patient and documentation Dr. [name] [name] Glennon

## 2024-08-22 ENCOUNTER — Other Ambulatory Visit: Payer: Self-pay | Admitting: Neurology

## 2024-08-27 ENCOUNTER — Telehealth: Payer: Self-pay

## 2024-08-27 DIAGNOSIS — G9349 Other encephalopathy: Secondary | ICD-10-CM | POA: Diagnosis not present

## 2024-08-27 DIAGNOSIS — E1142 Type 2 diabetes mellitus with diabetic polyneuropathy: Secondary | ICD-10-CM | POA: Diagnosis not present

## 2024-08-27 DIAGNOSIS — J441 Chronic obstructive pulmonary disease with (acute) exacerbation: Secondary | ICD-10-CM | POA: Diagnosis not present

## 2024-08-27 DIAGNOSIS — I700 Atherosclerosis of aorta: Secondary | ICD-10-CM | POA: Diagnosis not present

## 2024-08-27 DIAGNOSIS — I69398 Other sequelae of cerebral infarction: Secondary | ICD-10-CM | POA: Diagnosis not present

## 2024-08-27 NOTE — Telephone Encounter (Signed)
-----   Message from Belvie Clara sent at 08/27/2024  8:12 AM EDT ----- Ok to continue macrodantin  ----- Message ----- From: Gretta Carlos SAUNDERS, CMA Sent: 08/16/2024   2:57 PM EDT To: Belvie LITTIE Clara, MD  Drug-Age clinical concerns, please advised

## 2024-08-30 DIAGNOSIS — G9349 Other encephalopathy: Secondary | ICD-10-CM | POA: Diagnosis not present

## 2024-08-30 DIAGNOSIS — I69398 Other sequelae of cerebral infarction: Secondary | ICD-10-CM | POA: Diagnosis not present

## 2024-08-30 DIAGNOSIS — I700 Atherosclerosis of aorta: Secondary | ICD-10-CM | POA: Diagnosis not present

## 2024-08-30 DIAGNOSIS — E1142 Type 2 diabetes mellitus with diabetic polyneuropathy: Secondary | ICD-10-CM | POA: Diagnosis not present

## 2024-09-02 DIAGNOSIS — I69398 Other sequelae of cerebral infarction: Secondary | ICD-10-CM | POA: Diagnosis not present

## 2024-09-02 DIAGNOSIS — J441 Chronic obstructive pulmonary disease with (acute) exacerbation: Secondary | ICD-10-CM | POA: Diagnosis not present

## 2024-09-02 DIAGNOSIS — E1142 Type 2 diabetes mellitus with diabetic polyneuropathy: Secondary | ICD-10-CM | POA: Diagnosis not present

## 2024-09-02 DIAGNOSIS — G9349 Other encephalopathy: Secondary | ICD-10-CM | POA: Diagnosis not present

## 2024-09-04 DIAGNOSIS — E039 Hypothyroidism, unspecified: Secondary | ICD-10-CM | POA: Diagnosis not present

## 2024-09-04 DIAGNOSIS — M9901 Segmental and somatic dysfunction of cervical region: Secondary | ICD-10-CM | POA: Diagnosis not present

## 2024-09-04 DIAGNOSIS — M9902 Segmental and somatic dysfunction of thoracic region: Secondary | ICD-10-CM | POA: Diagnosis not present

## 2024-09-04 DIAGNOSIS — M542 Cervicalgia: Secondary | ICD-10-CM | POA: Diagnosis not present

## 2024-09-04 DIAGNOSIS — I10 Essential (primary) hypertension: Secondary | ICD-10-CM | POA: Diagnosis not present

## 2024-09-04 DIAGNOSIS — M546 Pain in thoracic spine: Secondary | ICD-10-CM | POA: Diagnosis not present

## 2024-09-04 DIAGNOSIS — E1165 Type 2 diabetes mellitus with hyperglycemia: Secondary | ICD-10-CM | POA: Diagnosis not present

## 2024-09-04 DIAGNOSIS — J4540 Moderate persistent asthma, uncomplicated: Secondary | ICD-10-CM | POA: Diagnosis not present

## 2024-09-04 DIAGNOSIS — K219 Gastro-esophageal reflux disease without esophagitis: Secondary | ICD-10-CM | POA: Diagnosis not present

## 2024-09-04 DIAGNOSIS — N390 Urinary tract infection, site not specified: Secondary | ICD-10-CM | POA: Diagnosis not present

## 2024-09-09 ENCOUNTER — Other Ambulatory Visit: Payer: Self-pay

## 2024-09-09 DIAGNOSIS — J441 Chronic obstructive pulmonary disease with (acute) exacerbation: Secondary | ICD-10-CM | POA: Diagnosis not present

## 2024-09-09 DIAGNOSIS — N390 Urinary tract infection, site not specified: Secondary | ICD-10-CM

## 2024-09-09 DIAGNOSIS — G9349 Other encephalopathy: Secondary | ICD-10-CM | POA: Diagnosis not present

## 2024-09-09 DIAGNOSIS — I69398 Other sequelae of cerebral infarction: Secondary | ICD-10-CM | POA: Diagnosis not present

## 2024-09-09 DIAGNOSIS — I700 Atherosclerosis of aorta: Secondary | ICD-10-CM | POA: Diagnosis not present

## 2024-09-09 DIAGNOSIS — E1142 Type 2 diabetes mellitus with diabetic polyneuropathy: Secondary | ICD-10-CM | POA: Diagnosis not present

## 2024-09-09 NOTE — Telephone Encounter (Signed)
 Vernell called to ask about pt abx refill pharmacist was advised that a message will be sent to MD if he wants her to continue Rx

## 2024-09-10 MED ORDER — NITROFURANTOIN MACROCRYSTAL 100 MG PO CAPS: 100.0000 mg | ORAL_CAPSULE | Freq: Every day | ORAL | 2 refills | Status: DC

## 2024-09-11 DIAGNOSIS — M546 Pain in thoracic spine: Secondary | ICD-10-CM | POA: Diagnosis not present

## 2024-09-11 DIAGNOSIS — M9901 Segmental and somatic dysfunction of cervical region: Secondary | ICD-10-CM | POA: Diagnosis not present

## 2024-09-11 DIAGNOSIS — M542 Cervicalgia: Secondary | ICD-10-CM | POA: Diagnosis not present

## 2024-09-11 DIAGNOSIS — M9902 Segmental and somatic dysfunction of thoracic region: Secondary | ICD-10-CM | POA: Diagnosis not present

## 2024-09-16 DIAGNOSIS — M9902 Segmental and somatic dysfunction of thoracic region: Secondary | ICD-10-CM | POA: Diagnosis not present

## 2024-09-16 DIAGNOSIS — M9901 Segmental and somatic dysfunction of cervical region: Secondary | ICD-10-CM | POA: Diagnosis not present

## 2024-09-16 DIAGNOSIS — M542 Cervicalgia: Secondary | ICD-10-CM | POA: Diagnosis not present

## 2024-09-16 DIAGNOSIS — M546 Pain in thoracic spine: Secondary | ICD-10-CM | POA: Diagnosis not present

## 2024-09-17 DIAGNOSIS — I700 Atherosclerosis of aorta: Secondary | ICD-10-CM | POA: Diagnosis not present

## 2024-09-17 DIAGNOSIS — E1142 Type 2 diabetes mellitus with diabetic polyneuropathy: Secondary | ICD-10-CM | POA: Diagnosis not present

## 2024-09-17 DIAGNOSIS — J441 Chronic obstructive pulmonary disease with (acute) exacerbation: Secondary | ICD-10-CM | POA: Diagnosis not present

## 2024-09-17 DIAGNOSIS — G9349 Other encephalopathy: Secondary | ICD-10-CM | POA: Diagnosis not present

## 2024-09-30 ENCOUNTER — Telehealth: Payer: Self-pay | Admitting: Urology

## 2024-09-30 DIAGNOSIS — N390 Urinary tract infection, site not specified: Secondary | ICD-10-CM

## 2024-09-30 NOTE — Telephone Encounter (Signed)
 Spoke with Candance about urine drop off. Candance state's she will let nurse know about the urine drop off.

## 2024-09-30 NOTE — Telephone Encounter (Signed)
 Kyra from [facility] called with concerns for patient having UTI Please call her at [phone]

## 2024-10-01 ENCOUNTER — Ambulatory Visit: Admitting: Obstetrics and Gynecology

## 2024-10-01 ENCOUNTER — Ambulatory Visit (INDEPENDENT_AMBULATORY_CARE_PROVIDER_SITE_OTHER)

## 2024-10-01 ENCOUNTER — Other Ambulatory Visit

## 2024-10-01 DIAGNOSIS — N950 Postmenopausal bleeding: Secondary | ICD-10-CM

## 2024-10-01 MED ORDER — IMVEXXY MAINTENANCE PACK 10 MCG VA INST: 1.0000 | VAGINAL_INSERT | VAGINAL | 12 refills | Status: DC

## 2024-10-01 NOTE — Progress Notes (Signed)
   Acute Office Visit  Subjective:    Patient ID: [name] White, female    DOB: 08-07-1941, 83 y.o.   MRN: [id number]   HPI 83 y.o. presents today for Consult (Ultrasound consult/) . (Referral for PMB/Has had 2 episodes of bleeding. The end of June was the 1X and the bleeding was very bad. The 2 time was mid July. She only bleed 1 day. She had fallen a wk to 2 wks before this happened. She has been dealing with a lot of UTIs. Has been in and out of the hospital.  /Mammo.-02/15/22/Colonoscopy-12/26/10) .h/o TAH Is being followed by urology and caregiver is applying estrogen cream PV twice weekly.  PUS today with  Vaginal cuff WNL Neither ovary seen No adnexal masses No free fluid   No LMP recorded. Patient has had a hysterectomy.    Review of Systems     Objective:    OBGyn Exam  BP (!) 98/52   Pulse 85   SpO2 91%  Wt Readings from Last 3 Encounters:  08/16/24 131 lb 12.8 oz (59.8 kg)  07/16/24 130 lb (59 kg)  07/04/24 130 lb 1.1 oz (59 kg)      Offered exam today and declines   Assessment & Plan:  PMB TAH Rectocele  Patient is hesitant to have referral to GI, but agreed to do cologuard to ensure not rectal bleeding. Encouraged continued care with urology.  To see if insurance will cover imvexxy  for her since this would be easier to insert by the care giver. 2. RTC for annual exams and with any concerns  20 minutes spent on reviewing records, imaging,  and one on one patient time and counseling patient and documentation Dr. [name] [name] Glennon

## 2024-10-02 ENCOUNTER — Telehealth: Payer: Self-pay

## 2024-10-02 ENCOUNTER — Telehealth: Payer: Self-pay | Admitting: *Deleted

## 2024-10-02 ENCOUNTER — Other Ambulatory Visit

## 2024-10-02 DIAGNOSIS — N390 Urinary tract infection, site not specified: Secondary | ICD-10-CM

## 2024-10-02 DIAGNOSIS — M9901 Segmental and somatic dysfunction of cervical region: Secondary | ICD-10-CM | POA: Diagnosis not present

## 2024-10-02 DIAGNOSIS — M546 Pain in thoracic spine: Secondary | ICD-10-CM | POA: Diagnosis not present

## 2024-10-02 DIAGNOSIS — M9902 Segmental and somatic dysfunction of thoracic region: Secondary | ICD-10-CM | POA: Diagnosis not present

## 2024-10-02 DIAGNOSIS — M542 Cervicalgia: Secondary | ICD-10-CM | POA: Diagnosis not present

## 2024-10-02 LAB — URINALYSIS, ROUTINE W REFLEX MICROSCOPIC
Bilirubin, UA: NEGATIVE
Glucose, UA: NEGATIVE
Ketones, UA: NEGATIVE
Nitrite, UA: NEGATIVE
Specific Gravity, UA: 1.015 (ref 1.005–1.030)
Urobilinogen, Ur: 0.2 mg/dL (ref 0.2–1.0)
pH, UA: 6 (ref 5.0–7.5)

## 2024-10-02 LAB — MICROSCOPIC EXAMINATION: WBC, UA: >30 /HPF — AB (ref 0–5)

## 2024-10-02 MED ORDER — NITROFURANTOIN MONOHYD MACRO 100 MG PO CAPS: 100.0000 mg | ORAL_CAPSULE | Freq: Two times a day (BID) | ORAL | 0 refills | Status: DC

## 2024-10-02 NOTE — Telephone Encounter (Signed)
 Call received from rachel at [facility], pharmacy that works with [employer]. Patients AVS noted Rx for Imvexxy , no Rx was sent to [facility] or provided. [name] White is inquiring where this Rx was sent?   Rx was sent to [facility], number provided. [name] White will reach out ot [facility] to determine if Rx can be transferred or if transferred. Will return call if any additional assistance is needed with Rx.   Encounter closed.

## 2024-10-02 NOTE — Telephone Encounter (Signed)
 Patient presents today with complaints of  Recurrent UTI.  UA and Culture done today.  Dr. Sherrilee  reviewed results and Macrobid  100 mg BID X 7 days .  Patient aware of MD recommendations and that we will reach out with culture results.      [email or web address], CMA

## 2024-10-04 ENCOUNTER — Telehealth: Payer: Self-pay

## 2024-10-04 LAB — URINE CULTURE

## 2024-10-04 NOTE — Telephone Encounter (Signed)
 PA for the Imvexxy  sent to covermymeds   Key: [id number]

## 2024-10-07 NOTE — Telephone Encounter (Signed)
 Approved on October 10 by [employer] 2017 NCPDP Request Reference Number: [id number]. IMVEXXY  MAIN SUP is approved through 12/25/2025. Your patient may now fill this prescription and it will be covered. Effective Date: 10/04/2024 Authorization Expiration Date: 12/25/2025 [phone].   Call returned to [place] at [place]. Calling for update on PA for Imvexxy , advised as seen above.   Rachedl also asking to clarify directions: place 1 cap vaginally 3 times a week. States this is commonly 2 times per week, would like clarification.   Dr. Glennon -please review. Rx dispensing amount  and PA does not reflect 3 times per week.   Cc: Geni

## 2024-10-09 ENCOUNTER — Other Ambulatory Visit: Payer: Self-pay | Admitting: Obstetrics and Gynecology

## 2024-10-09 ENCOUNTER — Ambulatory Visit

## 2024-10-09 DIAGNOSIS — Z1231 Encounter for screening mammogram for malignant neoplasm of breast: Secondary | ICD-10-CM

## 2024-10-09 MED ORDER — IMVEXXY MAINTENANCE PACK 10 MCG VA INST: 1.0000 | VAGINAL_INSERT | VAGINAL | 12 refills | Status: DC

## 2024-10-09 NOTE — Telephone Encounter (Signed)
 Spoke with Vernell, I advised her of the directions. She asked that we send in a new script in 3 wk, with the directions of   2 times a week thereafter for severe atrophic vaginitis, vaginal pain.   She states that pt is in a long care facility and would need it done this way.

## 2024-10-10 ENCOUNTER — Ambulatory Visit: Admitting: Urology

## 2024-10-10 DIAGNOSIS — R000 Tachycardia, unspecified: Secondary | ICD-10-CM | POA: Diagnosis not present

## 2024-10-11 ENCOUNTER — Emergency Department (HOSPITAL_COMMUNITY)

## 2024-10-11 ENCOUNTER — Encounter (HOSPITAL_COMMUNITY): Payer: Self-pay | Admitting: Family Medicine

## 2024-10-11 ENCOUNTER — Other Ambulatory Visit: Payer: Self-pay

## 2024-10-11 ENCOUNTER — Ambulatory Visit: Payer: Self-pay | Admitting: Obstetrics and Gynecology

## 2024-10-11 ENCOUNTER — Inpatient Hospital Stay (HOSPITAL_COMMUNITY)
Admission: EM | Admit: 2024-10-11 | Discharge: 2024-10-16 | DRG: 193 | Disposition: A | Discharge status: Home Health | Source: Skilled Nursing Facility | Attending: Internal Medicine | Admitting: Internal Medicine

## 2024-10-11 ENCOUNTER — Telehealth: Payer: Self-pay | Admitting: *Deleted

## 2024-10-11 DIAGNOSIS — J9601 Acute respiratory failure with hypoxia: Secondary | ICD-10-CM | POA: Diagnosis present

## 2024-10-11 DIAGNOSIS — M810 Age-related osteoporosis without current pathological fracture: Secondary | ICD-10-CM | POA: Diagnosis present

## 2024-10-11 DIAGNOSIS — E1142 Type 2 diabetes mellitus with diabetic polyneuropathy: Secondary | ICD-10-CM | POA: Diagnosis not present

## 2024-10-11 DIAGNOSIS — Z7982 Long term (current) use of aspirin: Secondary | ICD-10-CM | POA: Diagnosis not present

## 2024-10-11 DIAGNOSIS — R059 Cough, unspecified: Secondary | ICD-10-CM | POA: Diagnosis not present

## 2024-10-11 DIAGNOSIS — Z823 Family history of stroke: Secondary | ICD-10-CM

## 2024-10-11 DIAGNOSIS — E7800 Pure hypercholesterolemia, unspecified: Secondary | ICD-10-CM | POA: Diagnosis present

## 2024-10-11 DIAGNOSIS — K219 Gastro-esophageal reflux disease without esophagitis: Secondary | ICD-10-CM | POA: Diagnosis present

## 2024-10-11 DIAGNOSIS — Z8249 Family history of ischemic heart disease and other diseases of the circulatory system: Secondary | ICD-10-CM

## 2024-10-11 DIAGNOSIS — E876 Hypokalemia: Secondary | ICD-10-CM | POA: Diagnosis present

## 2024-10-11 DIAGNOSIS — J441 Chronic obstructive pulmonary disease with (acute) exacerbation: Secondary | ICD-10-CM | POA: Diagnosis not present

## 2024-10-11 DIAGNOSIS — J189 Pneumonia, unspecified organism: Principal | ICD-10-CM | POA: Diagnosis present

## 2024-10-11 DIAGNOSIS — F0393 Unspecified dementia, unspecified severity, with mood disturbance: Secondary | ICD-10-CM | POA: Diagnosis present

## 2024-10-11 DIAGNOSIS — Z79890 Hormone replacement therapy (postmenopausal): Secondary | ICD-10-CM

## 2024-10-11 DIAGNOSIS — F32A Depression, unspecified: Secondary | ICD-10-CM | POA: Diagnosis present

## 2024-10-11 DIAGNOSIS — R918 Other nonspecific abnormal finding of lung field: Secondary | ICD-10-CM | POA: Diagnosis not present

## 2024-10-11 DIAGNOSIS — J9811 Atelectasis: Secondary | ICD-10-CM | POA: Diagnosis not present

## 2024-10-11 DIAGNOSIS — Z7984 Long term (current) use of oral hypoglycemic drugs: Secondary | ICD-10-CM | POA: Diagnosis not present

## 2024-10-11 DIAGNOSIS — Z1152 Encounter for screening for COVID-19: Secondary | ICD-10-CM | POA: Diagnosis not present

## 2024-10-11 DIAGNOSIS — Z8673 Personal history of transient ischemic attack (TIA), and cerebral infarction without residual deficits: Secondary | ICD-10-CM | POA: Diagnosis not present

## 2024-10-11 DIAGNOSIS — Z743 Need for continuous supervision: Secondary | ICD-10-CM | POA: Diagnosis not present

## 2024-10-11 DIAGNOSIS — I10 Essential (primary) hypertension: Secondary | ICD-10-CM | POA: Diagnosis present

## 2024-10-11 DIAGNOSIS — J440 Chronic obstructive pulmonary disease with acute lower respiratory infection: Secondary | ICD-10-CM | POA: Diagnosis present

## 2024-10-11 DIAGNOSIS — E1165 Type 2 diabetes mellitus with hyperglycemia: Secondary | ICD-10-CM | POA: Diagnosis present

## 2024-10-11 DIAGNOSIS — R531 Weakness: Secondary | ICD-10-CM | POA: Diagnosis not present

## 2024-10-11 DIAGNOSIS — Z66 Do not resuscitate: Secondary | ICD-10-CM | POA: Diagnosis present

## 2024-10-11 DIAGNOSIS — E039 Hypothyroidism, unspecified: Secondary | ICD-10-CM | POA: Diagnosis present

## 2024-10-11 DIAGNOSIS — N390 Urinary tract infection, site not specified: Secondary | ICD-10-CM

## 2024-10-11 DIAGNOSIS — R0981 Nasal congestion: Secondary | ICD-10-CM | POA: Diagnosis not present

## 2024-10-11 DIAGNOSIS — Z825 Family history of asthma and other chronic lower respiratory diseases: Secondary | ICD-10-CM

## 2024-10-11 DIAGNOSIS — G9341 Metabolic encephalopathy: Secondary | ICD-10-CM | POA: Diagnosis present

## 2024-10-11 DIAGNOSIS — Z79899 Other long term (current) drug therapy: Secondary | ICD-10-CM

## 2024-10-11 DIAGNOSIS — Z90710 Acquired absence of both cervix and uterus: Secondary | ICD-10-CM

## 2024-10-11 DIAGNOSIS — J90 Pleural effusion, not elsewhere classified: Secondary | ICD-10-CM | POA: Diagnosis not present

## 2024-10-11 DIAGNOSIS — I700 Atherosclerosis of aorta: Secondary | ICD-10-CM | POA: Diagnosis not present

## 2024-10-11 DIAGNOSIS — Z7951 Long term (current) use of inhaled steroids: Secondary | ICD-10-CM

## 2024-10-11 DIAGNOSIS — R000 Tachycardia, unspecified: Secondary | ICD-10-CM | POA: Diagnosis not present

## 2024-10-11 LAB — CBC WITH DIFFERENTIAL/PLATELET
Abs Immature Granulocytes: 0.02 K/uL (ref 0.00–0.07)
Basophils Absolute: 0.1 K/uL (ref 0.0–0.1)
Basophils Relative: 1 %
Eosinophils Absolute: 0.5 K/uL (ref 0.0–0.5)
Eosinophils Relative: 5 %
HCT: 38.1 % (ref 36.0–46.0)
Hemoglobin: 12.2 g/dL (ref 12.0–15.0)
Immature Granulocytes: 0 %
Lymphocytes Relative: 20 %
Lymphs Abs: 2 K/uL (ref 0.7–4.0)
MCH: 28.7 pg (ref 26.0–34.0)
MCHC: 32 g/dL (ref 30.0–36.0)
MCV: 89.6 fL (ref 80.0–100.0)
Monocytes Absolute: 0.8 K/uL (ref 0.1–1.0)
Monocytes Relative: 8 %
Neutro Abs: 6.6 K/uL (ref 1.7–7.7)
Neutrophils Relative %: 66 %
Platelets: 236 K/uL (ref 150–400)
RBC: 4.25 MIL/uL (ref 3.87–5.11)
RDW: 16.2 % — ABNORMAL HIGH (ref 11.5–15.5)
WBC: 10 K/uL (ref 4.0–10.5)
nRBC: 0 % (ref 0.0–0.2)

## 2024-10-11 LAB — COMPREHENSIVE METABOLIC PANEL WITH GFR
ALT: 13 U/L (ref 0–44)
AST: 33 U/L (ref 15–41)
Albumin: 3.6 g/dL (ref 3.5–5.0)
Alkaline Phosphatase: 40 U/L (ref 38–126)
Anion gap: 13 (ref 5–15)
BUN: 8 mg/dL (ref 8–23)
CO2: 21 mmol/L — ABNORMAL LOW (ref 22–32)
Calcium: 9.1 mg/dL (ref 8.9–10.3)
Chloride: 100 mmol/L (ref 98–111)
Creatinine, Ser: 0.55 mg/dL (ref 0.44–1.00)
GFR, Estimated: >60 mL/min (ref >60)
Glucose, Bld: 119 mg/dL — ABNORMAL HIGH (ref 70–99)
Potassium: 4.3 mmol/L (ref 3.5–5.1)
Sodium: 134 mmol/L — ABNORMAL LOW (ref 135–145)
Total Bilirubin: 0.8 mg/dL (ref 0.0–1.2)
Total Protein: 6.6 g/dL (ref 6.5–8.1)

## 2024-10-11 LAB — RESP PANEL BY RT-PCR (RSV, FLU A&B, COVID)  RVPGX2
Influenza A by PCR: NEGATIVE
Influenza B by PCR: NEGATIVE
Resp Syncytial Virus by PCR: NEGATIVE
SARS Coronavirus 2 by RT PCR: NEGATIVE

## 2024-10-11 LAB — GLUCOSE, CAPILLARY
Glucose-Capillary: 96 mg/dL (ref 70–99)
Glucose-Capillary: 167 mg/dL — ABNORMAL HIGH (ref 70–99)

## 2024-10-11 MED ORDER — METHYLPREDNISOLONE SODIUM SUCC 125 MG IJ SOLR
60.0000 mg | Freq: Two times a day (BID) | INTRAMUSCULAR | Status: AC
  Administered 2024-10-11 – 2024-10-12 (×2): 60 mg via INTRAVENOUS
  Filled 2024-10-11 (×2): qty 2

## 2024-10-11 MED ORDER — FLUTICASONE FUROATE-VILANTEROL 200-25 MCG/ACT IN AEPB
1.0000 | INHALATION_SPRAY | Freq: Every day | RESPIRATORY_TRACT | Status: DC
  Administered 2024-10-12 – 2024-10-16 (×5): 1 via RESPIRATORY_TRACT
  Filled 2024-10-11: qty 28

## 2024-10-11 MED ORDER — SODIUM CHLORIDE 0.9 % IV BOLUS
500.0000 mL | Freq: Once | INTRAVENOUS | Status: AC
  Administered 2024-10-11: 500 mL via INTRAVENOUS

## 2024-10-11 MED ORDER — SODIUM CHLORIDE 0.9 % IV SOLN
500.0000 mg | Freq: Once | INTRAVENOUS | Status: AC
  Administered 2024-10-11: 500 mg via INTRAVENOUS
  Filled 2024-10-11: qty 5

## 2024-10-11 MED ORDER — IPRATROPIUM-ALBUTEROL 0.5-2.5 (3) MG/3ML IN SOLN
3.0000 mL | Freq: Four times a day (QID) | RESPIRATORY_TRACT | Status: DC
Start: 2024-10-11 — End: 2024-10-13
  Administered 2024-10-11 – 2024-10-13 (×7): 3 mL via RESPIRATORY_TRACT
  Filled 2024-10-11 (×8): qty 3

## 2024-10-11 MED ORDER — ASPIRIN 81 MG PO TBEC
81.0000 mg | DELAYED_RELEASE_TABLET | Freq: Every day | ORAL | Status: DC
  Administered 2024-10-12 – 2024-10-16 (×5): 81 mg via ORAL
  Filled 2024-10-11 (×5): qty 1

## 2024-10-11 MED ORDER — INSULIN GLARGINE-YFGN 100 UNIT/ML ~~LOC~~ SOLN
12.0000 [IU] | Freq: Every day | SUBCUTANEOUS | Status: DC
Start: 2024-10-11 — End: 2024-10-15
  Administered 2024-10-11 – 2024-10-14 (×4): 12 [IU] via SUBCUTANEOUS
  Filled 2024-10-11 (×5): qty 0.12

## 2024-10-11 MED ORDER — UMECLIDINIUM BROMIDE 62.5 MCG/ACT IN AEPB: 1.0000 | INHALATION_SPRAY | Freq: Every day | RESPIRATORY_TRACT | Status: DC

## 2024-10-11 MED ORDER — PREDNISONE 20 MG PO TABS
40.0000 mg | ORAL_TABLET | Freq: Every day | ORAL | Status: DC
  Administered 2024-10-13: 40 mg via ORAL
  Filled 2024-10-11: qty 2

## 2024-10-11 MED ORDER — SODIUM CHLORIDE 0.9 % IV SOLN
500.0000 mg | INTRAVENOUS | Status: DC
  Administered 2024-10-12 – 2024-10-15 (×4): 500 mg via INTRAVENOUS
  Filled 2024-10-11 (×4): qty 5

## 2024-10-11 MED ORDER — ATORVASTATIN CALCIUM 40 MG PO TABS
40.0000 mg | ORAL_TABLET | Freq: Every day | ORAL | Status: DC
  Administered 2024-10-12 – 2024-10-14 (×3): 40 mg via ORAL
  Filled 2024-10-11 (×3): qty 1

## 2024-10-11 MED ORDER — GUAIFENESIN ER 600 MG PO TB12
600.0000 mg | ORAL_TABLET | Freq: Two times a day (BID) | ORAL | Status: DC
  Administered 2024-10-11 – 2024-10-16 (×10): 600 mg via ORAL
  Filled 2024-10-11 (×9): qty 1

## 2024-10-11 MED ORDER — SODIUM CHLORIDE 0.9 % IV SOLN
2.0000 g | Freq: Once | INTRAVENOUS | Status: AC
  Administered 2024-10-11: 2 g via INTRAVENOUS
  Filled 2024-10-11: qty 20

## 2024-10-11 MED ORDER — LEVOTHYROXINE SODIUM 50 MCG PO TABS
50.0000 ug | ORAL_TABLET | Freq: Every day | ORAL | Status: DC
  Administered 2024-10-12 – 2024-10-16 (×5): 50 ug via ORAL
  Filled 2024-10-11 (×5): qty 1

## 2024-10-11 MED ORDER — PANTOPRAZOLE SODIUM 40 MG PO TBEC
40.0000 mg | DELAYED_RELEASE_TABLET | Freq: Every day | ORAL | Status: DC
  Administered 2024-10-12 – 2024-10-16 (×5): 40 mg via ORAL
  Filled 2024-10-11 (×5): qty 1

## 2024-10-11 MED ORDER — FLUTICASONE FUROATE-VILANTEROL 200-25 MCG/ACT IN AEPB: 1.0000 | INHALATION_SPRAY | Freq: Every day | RESPIRATORY_TRACT | Status: DC

## 2024-10-11 MED ORDER — INSULIN ASPART 100 UNIT/ML IJ SOLN: 0.0000 [IU] | Freq: Every day | INTRAMUSCULAR | Status: DC

## 2024-10-11 MED ORDER — ENOXAPARIN SODIUM 40 MG/0.4ML IJ SOSY
40.0000 mg | PREFILLED_SYRINGE | INTRAMUSCULAR | Status: DC
  Administered 2024-10-11 – 2024-10-15 (×5): 40 mg via SUBCUTANEOUS
  Filled 2024-10-11 (×5): qty 0.4

## 2024-10-11 MED ORDER — SODIUM CHLORIDE 0.9 % IV SOLN: INTRAVENOUS | Status: AC

## 2024-10-11 MED ORDER — POLYETHYLENE GLYCOL 3350 17 G PO PACK: 17.0000 g | PACK | Freq: Every day | ORAL | Status: DC | PRN

## 2024-10-11 MED ORDER — SODIUM CHLORIDE 0.9 % IV SOLN
2.0000 g | INTRAVENOUS | Status: DC
  Administered 2024-10-12 – 2024-10-15 (×4): 2 g via INTRAVENOUS
  Filled 2024-10-11 (×4): qty 20

## 2024-10-11 MED ORDER — ACETAMINOPHEN 650 MG RE SUPP: 650.0000 mg | Freq: Four times a day (QID) | RECTAL | Status: DC | PRN

## 2024-10-11 MED ORDER — ONDANSETRON HCL 4 MG PO TABS: 4.0000 mg | ORAL_TABLET | Freq: Four times a day (QID) | ORAL | Status: DC | PRN

## 2024-10-11 MED ORDER — TRAZODONE HCL 50 MG PO TABS
50.0000 mg | ORAL_TABLET | Freq: Every evening | ORAL | Status: DC | PRN
  Administered 2024-10-11 – 2024-10-15 (×5): 50 mg via ORAL
  Filled 2024-10-11 (×5): qty 1

## 2024-10-11 MED ORDER — UMECLIDINIUM BROMIDE 62.5 MCG/ACT IN AEPB
1.0000 | INHALATION_SPRAY | Freq: Every day | RESPIRATORY_TRACT | Status: DC
  Administered 2024-10-12 – 2024-10-16 (×5): 1 via RESPIRATORY_TRACT
  Filled 2024-10-11: qty 7

## 2024-10-11 MED ORDER — ACETAMINOPHEN 325 MG PO TABS
650.0000 mg | ORAL_TABLET | Freq: Four times a day (QID) | ORAL | Status: DC | PRN
  Administered 2024-10-14 – 2024-10-16 (×3): 650 mg via ORAL
  Filled 2024-10-11 (×3): qty 2

## 2024-10-11 MED ORDER — VORTIOXETINE HBR 5 MG PO TABS
10.0000 mg | ORAL_TABLET | Freq: Every day | ORAL | Status: DC
  Administered 2024-10-12 – 2024-10-16 (×5): 10 mg via ORAL
  Filled 2024-10-11 (×6): qty 2

## 2024-10-11 MED ORDER — INSULIN ASPART 100 UNIT/ML IJ SOLN
0.0000 [IU] | Freq: Three times a day (TID) | INTRAMUSCULAR | Status: DC
  Administered 2024-10-12: 4 [IU] via SUBCUTANEOUS
  Administered 2024-10-12 – 2024-10-13 (×3): 7 [IU] via SUBCUTANEOUS
  Administered 2024-10-13: 11 [IU] via SUBCUTANEOUS
  Administered 2024-10-13 – 2024-10-14 (×2): 3 [IU] via SUBCUTANEOUS
  Administered 2024-10-14: 4 [IU] via SUBCUTANEOUS
  Administered 2024-10-14: 3 [IU] via SUBCUTANEOUS

## 2024-10-11 MED ORDER — ONDANSETRON HCL 4 MG/2ML IJ SOLN
4.0000 mg | Freq: Four times a day (QID) | INTRAMUSCULAR | Status: DC | PRN
  Administered 2024-10-14: 4 mg via INTRAVENOUS
  Filled 2024-10-11: qty 2

## 2024-10-11 MED ORDER — VENLAFAXINE HCL ER 75 MG PO CP24
150.0000 mg | ORAL_CAPSULE | Freq: Every day | ORAL | Status: DC
  Administered 2024-10-12 – 2024-10-16 (×5): 150 mg via ORAL
  Filled 2024-10-11 (×5): qty 2

## 2024-10-11 NOTE — Plan of Care (Signed)
  Problem: Education: Goal: Knowledge of General Education information will improve Description: Including pain rating scale, medication(s)/side effects and non-pharmacologic comfort measures Outcome: Progressing   Problem: Health Behavior/Discharge Planning: Goal: Ability to manage health-related needs will improve Outcome: Progressing   Problem: Clinical Measurements: Goal: Ability to maintain clinical measurements within normal limits will improve Outcome: Progressing Goal: Will remain free from infection Outcome: Progressing Goal: Diagnostic test results will improve Outcome: Progressing Goal: Respiratory complications will improve Outcome: Progressing Goal: Cardiovascular complication will be avoided Outcome: Progressing   Problem: Activity: Goal: Risk for activity intolerance will decrease Outcome: Progressing   Problem: Nutrition: Goal: Adequate nutrition will be maintained Outcome: Progressing   Problem: Coping: Goal: Level of anxiety will decrease Outcome: Progressing   Problem: Elimination: Goal: Will not experience complications related to bowel motility Outcome: Progressing Goal: Will not experience complications related to urinary retention Outcome: Progressing   Problem: Pain Managment: Goal: General experience of comfort will improve and/or be controlled Outcome: Progressing   Problem: Safety: Goal: Ability to remain free from injury will improve Outcome: Progressing   Problem: Skin Integrity: Goal: Risk for impaired skin integrity will decrease Outcome: Progressing   Problem: Education: Goal: Knowledge of disease or condition will improve Outcome: Progressing Goal: Knowledge of the prescribed therapeutic regimen will improve Outcome: Progressing Goal: Individualized Educational Video(s) Outcome: Progressing   Problem: Activity: Goal: Ability to tolerate increased activity will improve Outcome: Progressing Goal: Will verbalize the  importance of balancing activity with adequate rest periods Outcome: Progressing   Problem: Respiratory: Goal: Ability to maintain a clear airway will improve Outcome: Progressing Goal: Levels of oxygenation will improve Outcome: Progressing Goal: Ability to maintain adequate ventilation will improve Outcome: Progressing   Problem: Activity: Goal: Ability to tolerate increased activity will improve Outcome: Progressing   Problem: Clinical Measurements: Goal: Ability to maintain a body temperature in the normal range will improve Outcome: Progressing   Problem: Respiratory: Goal: Ability to maintain adequate ventilation will improve Outcome: Progressing Goal: Ability to maintain a clear airway will improve Outcome: Progressing   Problem: Education: Goal: Ability to describe self-care measures that may prevent or decrease complications (Diabetes Survival Skills Education) will improve Outcome: Progressing Goal: Individualized Educational Video(s) Outcome: Progressing   Problem: Coping: Goal: Ability to adjust to condition or change in health will improve Outcome: Progressing   Problem: Fluid Volume: Goal: Ability to maintain a balanced intake and output will improve Outcome: Progressing   Problem: Health Behavior/Discharge Planning: Goal: Ability to identify and utilize available resources and services will improve Outcome: Progressing Goal: Ability to manage health-related needs will improve Outcome: Progressing   Problem: Metabolic: Goal: Ability to maintain appropriate glucose levels will improve Outcome: Progressing   Problem: Nutritional: Goal: Maintenance of adequate nutrition will improve Outcome: Progressing Goal: Progress toward achieving an optimal weight will improve Outcome: Progressing   Problem: Skin Integrity: Goal: Risk for impaired skin integrity will decrease Outcome: Progressing   Problem: Tissue Perfusion: Goal: Adequacy of tissue  perfusion will improve Outcome: Progressing

## 2024-10-11 NOTE — H&P (Signed)
 History and Physical    Patient: [name] White FMW:[id number] DOB: 12-29-1940 DOA: 10/11/2024 DOS: the patient was seen and examined on 10/11/2024 PCP: [name], MD  Patient coming from: ALF/ILF  Chief Complaint:  Chief Complaint  Patient presents with   Nasal Congestion   Cough   HPI: [name] White is a 83 y.o. female with medical history significant of diabetes, hypertension, hypothyroidism, dementia, malnutrition, history of CVA, asthma.  Patient is a resident of [place] and has been having coughing, congestion, fatigue since yesterday.  Due to her symptoms, she was brought here for evaluation.  Chest x-ray here shows pneumonia.  No white count, but is is borderline hypoxic.  She did have something similar back in May and early June.  She was admitted with pneumonia and COPD exacerbation and discharged on steroids and in a long-acting corticosteroid inhalers.  She did see pulmonology who is not convinced of her COPD diagnosis.  Her Breo was stopped, but she continued on the short acting inhalers as needed.  Review of Systems: As mentioned in the history of present illness. All other systems reviewed and are negative. Past Medical History:  Diagnosis Date   Anemia    Anxiety    Asthma    ASYMPTOMATIC POSTMENOPAUSAL STATUS 02/09/2009   Cataract    Cerebral infarction [facility])    Constipation 03/19/2013   Depression    DIABETES MELLITUS, TYPE II 09/14/2007   HYPERCHOLESTEROLEMIA 02/09/2009   HYPERTENSION 02/09/2009   HYPOTHYROIDISM 09/14/2007   Metabolic encephalopathy    MIGRAINE HEADACHE 09/14/2007   Neuropathy    OSTEOPOROSIS 02/09/2009   PANCREATITIS 09/14/2007   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   PONV (postoperative nausea and vomiting)    Protein calorie malnutrition    Rectocele 03/19/2013   Shortness of breath    SUPERFICIAL PHLEBITIS 09/14/2007   Varicose veins    Past Surgical History:  Procedure Laterality Date   ABDOMINAL HYSTERECTOMY     ANTERIOR AND  POSTERIOR REPAIR N/A 04/02/2013   Procedure: ANTERIOR (CYSTOCELE) AND POSTERIOR REPAIR (RECTOCELE);  Surgeon: Norleen LULLA Server, MD;  Location: [facility];  Service: Gynecology;  Laterality: N/A;   APPENDECTOMY     BRAIN SURGERY     CATARACT EXTRACTION W/PHACO  12/05/2011   Procedure: CATARACT EXTRACTION PHACO AND INTRAOCULAR LENS PLACEMENT (IOC);  Surgeon: Cherene Mania;  Location: [facility];  Service: Ophthalmology;  Laterality: Left;  CDE:9.65   CHOLECYSTECTOMY     CRANIOTOMY N/A 06/04/2020   Procedure: Endoscopic Transphenoidal Resection of Recurrent PituitaryTumor;  Surgeon: Colon Shove, MD;  Location: [facility] OR;  Service: Neurosurgery;  Laterality: N/A;   CYSTOSCOPY     ENDOVENOUS ABLATION SAPHENOUS VEIN W/ LASER  11-03-2011   right greater saphenous vein   left leg done 10-2011   EYE SURGERY  98   right cataract extraction 98   LAPAROSCOPIC NISSEN FUNDOPLICATION     NM ESOPHAGEAL REFLUX  08-11-11   PITUITARY EXCISION  10/1997   POSTERIOR REPAIR     TRANSNASAL APPROACH N/A 06/04/2020   Procedure: TRANSNASAL APPROACH;  Surgeon: Mable Lenis, MD;  Location: [facility] OR;  Service: ENT;  Laterality: N/A;   TRANSPHENOIDAL / TRANSNASAL HYPOPHYSECTOMY / RESECTION PITUITARY TUMOR  08-11-11   Social History:  reports that she has never smoked. She has never used smokeless tobacco. She reports that she does not drink alcohol and does not use drugs.  Allergies  Allergen Reactions   Betadine [Povidone Iodine] Hives   Penicillins Rash    Family History  Problem Relation Age of Onset   Heart disease Mother    Asthma Mother    COPD Father    Heart disease Father    Stroke Father    Asthma Daughter    Migraines Daughter    Neuropathy Son    Cancer Neg Hx    Anesthesia problems Neg Hx    Hypotension Neg Hx    Malignant hyperthermia Neg Hx    Pseudochol deficiency Neg Hx     Prior to Admission medications   Medication Sig Start Date End Date Taking? Authorizing Provider  acetaminophen  (TYLENOL ) 325  MG tablet Take 2 tablets (650 mg total) by mouth every 6 (six) hours as needed for mild pain (pain score 1-3) (or Fever >/= 101). 01/01/24   Shahmehdi, Adriana LABOR, MD  albuterol  (VENTOLIN  HFA) 108 (90 Base) MCG/ACT inhaler Inhale 2 puffs into the lungs every 4 (four) hours as needed for wheezing or shortness of breath. 05/01/24   Johnson, Clanford L, MD  aspirin  EC 81 MG tablet Take 81 mg by mouth daily. Swallow whole.    [provider]  atorvastatin  (LIPITOR) 40 MG tablet TAKE (2) TABLETS (80 MG) BY MOUTH DAILY. 08/22/24   Camara, Amadou, MD  ciprofloxacin  (CIPRO ) 500 MG tablet Take 500 mg by mouth 2 (two) times daily. 08/13/24   [provider]  clotrimazole (LOTRIMIN) 1 % cream Apply 1 Application topically 2 (two) times daily. 05/21/24   [provider]  cyanocobalamin  (VITAMIN B12) 1000 MCG/ML injection  07/26/24   [provider]  cycloSPORINE  (RESTASIS ) 0.05 % ophthalmic emulsion Place 1 drop into both eyes 2 (two) times daily.    [provider]  dextromethorphan  (DELSYM ) 30 MG/5ML liquid Take 5 mLs (30 mg total) by mouth 2 (two) times daily as needed for cough. 05/01/24   Johnson, Clanford L, MD  Estradiol  (IMVEXXY  MAINTENANCE PACK) 10 MCG INST Place 1 Insert vaginally 2 (two) times a week. Please use three times a week PV for two weeks for severe atrophy then two times a week thereafter 10/10/24   [name] POUR, MD  fluticasone  furoate-vilanterol (BREO ELLIPTA ) 200-25 MCG/ACT AEPB Inhale 1 puff into the lungs daily. 03/19/24   Pearlean Manus, MD  furosemide  (LASIX ) 20 MG tablet Take 1 tablet (20 mg total) by mouth daily. 05/03/24 05/03/25  Johnson, Clanford L, MD  ipratropium-albuterol  (DUONEB) 0.5-2.5 (3) MG/3ML SOLN INHALE ONE VIAL ( ) VIA NEBULIZATION EVERY FOUR HOURS AS NEEDED FOR WHEEZING / SHORTNESS OF BREATH. 07/16/24   Darlean Ozell NOVAK, MD  levothyroxine  (SYNTHROID ) 50 MCG tablet Take 50 mcg by mouth daily.    [provider]  metFORMIN   (GLUCOPHAGE ) 500 MG tablet Take 500 mg by mouth 2 (two) times daily. 04/16/24   [provider]  midodrine  (PROAMATINE ) 5 MG tablet Take 5 mg by mouth. 06/05/24   [provider]  Multiple Vitamins-Minerals (THERA-M) TABS Take 1 tablet by mouth daily.    [provider]  nitrofurantoin  (MACRODANTIN ) 100 MG capsule Take 1 capsule (100 mg total) by mouth daily. 09/10/24   McKenzie, Belvie CROME, MD  nitrofurantoin , macrocrystal-monohydrate, (MACROBID ) 100 MG capsule Take 1 capsule (100 mg total) by mouth every 12 (twelve) hours. 10/02/24   McKenzie, Belvie CROME, MD  NONFORMULARY OR COMPOUNDED ITEM If patient is incontinent of urine and/or stool, SNF staff are instructed to cleanse patient's genital area at least once every 8 hours and change diaper if soiled. 07/10/24   Gerldine Lauraine BROCKS, FNP  NONFORMULARY OR COMPOUNDED  ITEM SNF staff are instructed to encourage hydration with water  at least once every 2-4 hours while patient is awake. 07/10/24   Larocco, Sarah C, FNP  NONFORMULARY OR COMPOUNDED ITEM SNF staff are instructed to prompt toileting and assist patient with ambulation to toilet at least once every 4-6 hours while awake. 07/10/24   Larocco, Sarah C, FNP  omeprazole (PRILOSEC) 20 MG capsule Take 20 mg by mouth every morning.    [provider]  polyethylene glycol (MIRALAX  / GLYCOLAX ) 17 g packet Take 17 g by mouth daily as needed for mild constipation. 05/01/24   Johnson, Clanford L, MD  potassium chloride  (KLOR-CON ) 10 MEQ tablet Take 10 mEq by mouth daily. 05/15/24   [provider]  pregabalin  (LYRICA ) 150 MG capsule Take 1 capsule (150 mg total) by mouth 2 (two) times daily. 03/19/24   Pearlean Manus, MD  rizatriptan  (MAXALT ) 10 MG tablet Take 10 mg by mouth every 2 (two) hours as needed for migraine. No more than 2 doses in 24 hours 04/16/24   [provider]  senna (SENOKOT) 8.6 MG TABS tablet Take 2 tablets by mouth at bedtime.    [provider]  tiZANidine (ZANAFLEX) 2 MG tablet Take 2 mg by mouth 2 (two) times daily. 06/21/24   [provider]  traZODone  (DESYREL ) 50 MG tablet Take 1 tablet (50 mg total) by mouth at bedtime as needed for sleep. 03/19/24   Pearlean Manus, MD  TRINTELLIX  10 MG TABS tablet Take 10 mg by mouth daily. 03/09/24   [provider]  umeclidinium bromide  (INCRUSE ELLIPTA ) 62.5 MCG/ACT AEPB Inhale 1 puff into the lungs daily. 05/27/24   Evonnie Lenis, MD  venlafaxine  (EFFEXOR -XR) 150 MG 24 hr capsule Take 150 mg by mouth every morning.    [provider]  Vibegron (GEMTESA) 75 MG TABS Take 75 mg by mouth. 04/10/24   [provider]    Physical Exam: Vitals:   10/11/24 1052 10/11/24 1145 10/11/24 1430 10/11/24 1542  BP:  (!) 101/53    Pulse: (!) 103 (!) 103 (!) 102   Resp: (!) 21 (!) 22 16   Temp: 98.2 F (36.8 C)   98.2 F (36.8 C)  TempSrc: Oral   Oral  SpO2: 95% 94% 95%   Weight:      Height:       General: Elderly female. Awake and alert and oriented x3. No acute cardiopulmonary distress.  HEENT: Normocephalic atraumatic.  Right and left ears normal in appearance.  Pupils equal, round, reactive to light. Extraocular muscles are intact. Sclerae anicteric and noninjected.  Moist mucosal membranes. No mucosal lesions.  Neck: Neck supple without lymphadenopathy. No carotid bruits. No masses palpated.  Cardiovascular: Tachycardic rate with normal S1-S2 sounds. No murmurs, rubs, gallops auscultated. No JVD.  Respiratory: Fine expiratory wheezing throughout all.  Rales in the bases, particularly left base.  Rhonchi scattered no accessory muscle use. Abdomen: Soft, nontender, nondistended. Active bowel sounds. No masses or hepatosplenomegaly  Skin: Dry, warm to touch. 2+ dorsalis pedis and radial pulses. Musculoskeletal: No calf or leg pain. All major joints not erythematous nontender.  No upper or lower joint deformation.  Good ROM.  No contractures  Psychiatric:  Intact judgment and insight. Pleasant and cooperative. Neurologic: No focal neurological deficits. Strength is 5/5 and symmetric in upper and lower extremities.  Cranial nerves II through XII are grossly intact.  Data Reviewed: Labs and imaging reviewed by me  Assessment and Plan: No notes have  been filed under this hospital service. Service: Hospitalist  Active Problems:   Essential hypertension   Type 2 diabetes mellitus with peripheral neuropathy (HCC)   GERD without esophagitis   CAP (community acquired pneumonia)  Community acquired pneumonia Antibiotics: Rocephin  and azithromycin  Robitussin Blood cultures drawn in the emergency department Sputum cultures CBC tomorrow Strep and Legionella antigen by urine Possible reactive airway disease Steroids IHC SABA scheduled and as needed GERD Continue PPI Type 2 diabetes Hold metformin  Insulin  20 mg/kg as long-acting at night Aggressive sliding scale as the patient had hyperglycemic episodes while on steroids last time she had pneumonia Hypertension Continue antihypertensives    Advance Care Planning:   Code Status: Limited: Do not attempt resuscitation (DNR) -DNR-LIMITED -Do Not Intubate/DNI confirmed by patient's family  Consults: None  Family Communication: Spoke with patient's daughter by phone  Severity of Illness: The appropriate patient status for this patient is INPATIENT. Inpatient status is judged to be reasonable and necessary in order to provide the required intensity of service to ensure the patient's safety. The patient's presenting symptoms, physical exam findings, and initial radiographic and laboratory data in the context of their chronic comorbidities is felt to place them at high risk for further clinical deterioration. Furthermore, it is not anticipated that the patient will be medically stable for discharge from the hospital within 2 midnights of admission.   * I certify that at the point of admission it  is my clinical judgment that the patient will require inpatient hospital care spanning beyond 2 midnights from the point of admission due to high intensity of service, high risk for further deterioration and high frequency of surveillance required.*  Author: Dock Baccam J Rozelle Caudle, DO 10/11/2024 4:16 PM  For on call review [email or web address].

## 2024-10-11 NOTE — Plan of Care (Signed)

## 2024-10-11 NOTE — ED Triage Notes (Signed)
 Pt arrived [facility] from [facility] in [place]. Pt has had coughing, nasal congestion  and fatigue since yesterday.

## 2024-10-11 NOTE — Telephone Encounter (Signed)
 Spoke with Kyra at [facility]. States they received prescription of Imvexxy , will need signed order from Dr. Glennon to discontinue vaginal estrogen cream. Advised Rx already d/c in chart 10/09/24 by Dr. Glennon,  I will call [facility] to d/c at pharmacy. Serafin states she will send fax to [facility], will need signature from Dr. Glennon confirming medication d/c, will fax to [phone]   Spoke with Misty at [facility], estradiol  vaginal cream discontinued.   Routing to provider for final review. Patient is agreeable to disposition. Will close encounter.

## 2024-10-11 NOTE — ED Provider Notes (Signed)
 [place] EMERGENCY DEPARTMENT AT [name] Provider Note   CSN: [id number] Arrival date & time: 10/11/24  1036     Patient presents with: Nasal Congestion and Cough   [name] White is a 83 y.o. female.   83 year old female previous diagnosis of asthma/COPD?, diabetes, stroke, hypertension, and anxiety presents the emergency department with cough and congestion.  Symptoms started yesterday.  Also having generalized fatigue.  Not productive.  No fevers.  Lives at [facility] but no known sick contacts.  Denies any pain.  No leg swelling.       Prior to Admission medications   Medication Sig Start Date End Date Taking? Authorizing Provider  acetaminophen  (TYLENOL ) 325 MG tablet Take 2 tablets (650 mg total) by mouth every 6 (six) hours as needed for mild pain (pain score 1-3) (or Fever >/= 101). 01/01/24   Shahmehdi, Adriana LABOR, MD  albuterol  (VENTOLIN  HFA) 108 (90 Base) MCG/ACT inhaler Inhale 2 puffs into the lungs every 4 (four) hours as needed for wheezing or shortness of breath. 05/01/24   Johnson, Clanford L, MD  aspirin  EC 81 MG tablet Take 81 mg by mouth daily. Swallow whole.    [provider]  atorvastatin  (LIPITOR) 40 MG tablet TAKE (2) TABLETS (80 MG) BY MOUTH DAILY. 08/22/24   Camara, Amadou, MD  ciprofloxacin  (CIPRO ) 500 MG tablet Take 500 mg by mouth 2 (two) times daily. 08/13/24   [provider]  clotrimazole (LOTRIMIN) 1 % cream Apply 1 Application topically 2 (two) times daily. 05/21/24   [provider]  cyanocobalamin  (VITAMIN B12) 1000 MCG/ML injection  07/26/24   [provider]  cycloSPORINE  (RESTASIS ) 0.05 % ophthalmic emulsion Place 1 drop into both eyes 2 (two) times daily.    [provider]  dextromethorphan  (DELSYM ) 30 MG/5ML liquid Take 5 mLs (30 mg total) by mouth 2 (two) times daily as needed for cough. 05/01/24   Johnson, Clanford L, MD  Estradiol  (IMVEXXY  MAINTENANCE PACK) 10 MCG INST Place 1 Insert  vaginally 2 (two) times a week. Please use three times a week PV for two weeks for severe atrophy then two times a week thereafter 10/10/24   [name] POUR, MD  fluticasone  furoate-vilanterol (BREO ELLIPTA ) 200-25 MCG/ACT AEPB Inhale 1 puff into the lungs daily. 03/19/24   Pearlean Manus, MD  furosemide  (LASIX ) 20 MG tablet Take 1 tablet (20 mg total) by mouth daily. 05/03/24 05/03/25  Johnson, Clanford L, MD  ipratropium-albuterol  (DUONEB) 0.5-2.5 (3) MG/3ML SOLN INHALE ONE VIAL ( ) VIA NEBULIZATION EVERY FOUR HOURS AS NEEDED FOR WHEEZING / SHORTNESS OF BREATH. 07/16/24   Darlean Ozell NOVAK, MD  levothyroxine  (SYNTHROID ) 50 MCG tablet Take 50 mcg by mouth daily.    [provider]  metFORMIN  (GLUCOPHAGE ) 500 MG tablet Take 500 mg by mouth 2 (two) times daily. 04/16/24   [provider]  midodrine  (PROAMATINE ) 5 MG tablet Take 5 mg by mouth. 06/05/24   [provider]  Multiple Vitamins-Minerals (THERA-M) TABS Take 1 tablet by mouth daily.    [provider]  nitrofurantoin  (MACRODANTIN ) 100 MG capsule Take 1 capsule (100 mg total) by mouth daily. 09/10/24   McKenzie, Belvie CROME, MD  nitrofurantoin , macrocrystal-monohydrate, (MACROBID ) 100 MG capsule Take 1 capsule (100 mg total) by mouth every 12 (twelve) hours. 10/02/24   McKenzie, Belvie CROME, MD  NONFORMULARY OR COMPOUNDED ITEM If patient is incontinent of urine and/or stool, SNF staff are instructed to cleanse patient's genital area at least once  every 8 hours and change diaper if soiled. 07/10/24   Gerldine Lauraine BROCKS, FNP  NONFORMULARY OR COMPOUNDED ITEM SNF staff are instructed to encourage hydration with water  at least once every 2-4 hours while patient is awake. 07/10/24   Larocco, Sarah C, FNP  NONFORMULARY OR COMPOUNDED ITEM SNF staff are instructed to prompt toileting and assist patient with ambulation to toilet at least once every 4-6 hours while awake. 07/10/24   Larocco, Sarah C, FNP  omeprazole (PRILOSEC) 20 MG  capsule Take 20 mg by mouth every morning.    [provider]  polyethylene glycol (MIRALAX  / GLYCOLAX ) 17 g packet Take 17 g by mouth daily as needed for mild constipation. 05/01/24   Johnson, Clanford L, MD  potassium chloride  (KLOR-CON ) 10 MEQ tablet Take 10 mEq by mouth daily. 05/15/24   [provider]  pregabalin  (LYRICA ) 150 MG capsule Take 1 capsule (150 mg total) by mouth 2 (two) times daily. 03/19/24   Pearlean Manus, MD  rizatriptan  (MAXALT ) 10 MG tablet Take 10 mg by mouth every 2 (two) hours as needed for migraine. No more than 2 doses in 24 hours 04/16/24   [provider]  senna (SENOKOT) 8.6 MG TABS tablet Take 2 tablets by mouth at bedtime.    [provider]  tiZANidine (ZANAFLEX) 2 MG tablet Take 2 mg by mouth 2 (two) times daily. 06/21/24   [provider]  traZODone  (DESYREL ) 50 MG tablet Take 1 tablet (50 mg total) by mouth at bedtime as needed for sleep. 03/19/24   Pearlean Manus, MD  TRINTELLIX  10 MG TABS tablet Take 10 mg by mouth daily. 03/09/24   [provider]  umeclidinium bromide  (INCRUSE ELLIPTA ) 62.5 MCG/ACT AEPB Inhale 1 puff into the lungs daily. 05/27/24   Evonnie Lenis, MD  venlafaxine  (EFFEXOR -XR) 150 MG 24 hr capsule Take 150 mg by mouth every morning.    [provider]  Vibegron (GEMTESA) 75 MG TABS Take 75 mg by mouth. 04/10/24   [provider]    Allergies: Betadine [povidone iodine] and Penicillins    Review of Systems  Updated Vital Signs BP 92/71 (BP Location: Left Arm)   Pulse 100   Temp 98.5 F (36.9 C)   Resp 20   Ht 5' 7 (1.702 m)   Wt 59 kg   SpO2 90%   BMI 20.36 kg/m   Physical Exam Vitals and nursing note reviewed.  Constitutional:      General: She is not in acute distress.    Appearance: She is well-developed.  HENT:     Head: Normocephalic and atraumatic.     Right Ear: External ear normal.     Left Ear: External ear normal.     Nose: Nose normal.  Eyes:      Extraocular Movements: Extraocular movements intact.     Conjunctiva/sclera: Conjunctivae normal.     Pupils: Pupils are equal, round, and reactive to light.  Cardiovascular:     Rate and Rhythm: Normal rate and regular rhythm.     Heart sounds: No murmur heard. Pulmonary:     Effort: Pulmonary effort is normal. No respiratory distress.     Breath sounds: Rales (Left base) present.  Musculoskeletal:     Cervical back: Normal range of motion and neck supple.     Right lower leg: No edema.     Left lower leg: No edema.  Skin:    General: Skin is warm and dry.  Neurological:  Mental Status: She is alert.  Psychiatric:        Mood and Affect: Mood normal.     (all labs ordered are listed, but only abnormal results are displayed) Labs Reviewed  CBC WITH DIFFERENTIAL/PLATELET - Abnormal; Notable for the following components:      Result Value   RDW 16.2 (*)    All other components within normal limits  COMPREHENSIVE METABOLIC PANEL WITH GFR - Abnormal; Notable for the following components:   Sodium 134 (*)    CO2 21 (*)    Glucose, Bld 119 (*)    All other components within normal limits  GLUCOSE, CAPILLARY - Abnormal; Notable for the following components:   Glucose-Capillary 167 (*)    All other components within normal limits  RESP PANEL BY RT-PCR (RSV, FLU A&B, COVID)  [id number]  CULTURE, BLOOD (ROUTINE X 2)  CULTURE, BLOOD (ROUTINE X 2)  GLUCOSE, CAPILLARY  STREP PNEUMONIAE URINARY ANTIGEN  LEGIONELLA PNEUMOPHILA SEROGP 1 UR AG  CBC  BASIC METABOLIC PANEL WITH GFR    EKG: EKG Interpretation Date/Time:  Friday October 11 2024 10:51:29 EDT Ventricular Rate:  106 PR Interval:  152 QRS Duration:  81 QT Interval:  379 QTC Calculation: 504 R Axis:   75  Text Interpretation: Sinus tachycardia Borderline repolarization abnormality Prolonged QT interval Confirmed by Yolande Charleston [phone]) on 10/11/2024 12:19:06 PM  Radiology: CT Chest Wo Contrast Result Date:  10/11/2024 EXAM: CT CHEST WITHOUT CONTRAST 10/11/2024 01:05:02 PM TECHNIQUE: CT of the chest was performed without the administration of intravenous contrast. Multiplanar reformatted images are provided for review. Automated exposure control, iterative reconstruction, and/or weight based adjustment of the mA/kV was utilized to reduce the radiation dose to as low as reasonably achievable. COMPARISON: 05/22/2024 CLINICAL HISTORY: pna type symptoms. FINDINGS: MEDIASTINUM: Heart and pericardium are unremarkable. The central airways are clear. Aortic atherosclerosis. LYMPH NODES: No mediastinal, hilar or axillary lymphadenopathy. LUNGS AND PLEURA: Minimal right pleural effusion is noted. Mild left posterior basilar opacity is noted concerning for subsegmental atelectasis or possibly pneumonia. No pneumothorax. SOFT TISSUES/BONES: Moderate compression deformity of T12 vertebral body is noted concerning for fracture of indeterminate age. No acute abnormality of the soft tissues. UPPER ABDOMEN: Limited images of the upper abdomen demonstrates no acute abnormality. IMPRESSION: 1. Mild left posterior basilar opacity, suspicious for subsegmental atelectasis or pneumonia. 2. Minimal right pleural effusion. 3. Moderate T12 vertebral body compression deformity, age indeterminate. Electronically signed by: Lynwood Seip MD 10/11/2024 02:30 PM EDT RP Workstation: [id number]   DG Chest 2 View Result Date: 10/11/2024 EXAM: 2 VIEW(S) XRAY OF THE CHEST 10/11/2024 11:11:51 AM COMPARISON: 05/29/2024 CLINICAL HISTORY: cough. Per Triage: Pt arrived [facility] from [facility] in [place]. Pt has had coughing, nasal congestion and fatigue since yesterday. FINDINGS: LUNGS AND PLEURA: Minimal bibasilar subsegmental atelectasis. Minimal bilateral pleural effusions are noted. No pulmonary edema. No pneumothorax. HEART AND MEDIASTINUM: No acute abnormality of the cardiac and mediastinal silhouettes. BONES AND SOFT TISSUES: No acute osseous  abnormality. IMPRESSION: 1. Minimal bilateral pleural effusions. 2. Minimal bibasilar subsegmental atelectasis. Electronically signed by: Lynwood Seip MD 10/11/2024 11:27 AM EDT RP Workstation: [id number]     Procedures   Medications Ordered in the ED  aspirin  EC tablet 81 mg (has no administration in time range)  atorvastatin  (LIPITOR) tablet 40 mg (has no administration in time range)  levothyroxine  (SYNTHROID ) tablet 50 mcg (has no administration in time range)  venlafaxine  XR (EFFEXOR -XR) 24 hr capsule 150 mg (has no administration in time range)  vortioxetine  HBr (TRINTELLIX ) tablet 10 mg (has no administration in time range)  traZODone  (DESYREL ) tablet 50 mg (has no administration in time range)  pantoprazole  (PROTONIX ) EC tablet 40 mg (has no administration in time range)  enoxaparin  (LOVENOX ) injection 40 mg (has no administration in time range)  cefTRIAXone  (ROCEPHIN ) 2 g in sodium chloride  0.9 % 100 mL IVPB (has no administration in time range)  azithromycin  (ZITHROMAX ) 500 mg in sodium chloride  0.9 % 250 mL IVPB (has no administration in time range)  methylPREDNISolone  sodium succinate (SOLU-MEDROL ) 125 mg/2 mL injection 60 mg (60 mg Intravenous Given 10/11/24 1707)    Followed by  predniSONE  (DELTASONE ) tablet 40 mg (has no administration in time range)  ipratropium-albuterol  (DUONEB) 0.5-2.5 (3) MG/3ML nebulizer solution 3 mL (3 mLs Nebulization Given 10/11/24 1950)  0.9 %  sodium chloride  infusion ( Intravenous Stopped 10/11/24 1710)  acetaminophen  (TYLENOL ) tablet 650 mg (has no administration in time range)    Or  acetaminophen  (TYLENOL ) suppository 650 mg (has no administration in time range)  ondansetron  (ZOFRAN ) tablet 4 mg (has no administration in time range)    Or  ondansetron  (ZOFRAN ) injection 4 mg (has no administration in time range)  polyethylene glycol (MIRALAX  / GLYCOLAX ) packet 17 g (has no administration in time range)  guaiFENesin  (MUCINEX ) 12 hr tablet 600  mg (has no administration in time range)  insulin  aspart (novoLOG ) injection 0-20 Units (has no administration in time range)  insulin  aspart (novoLOG ) injection 0-5 Units ( Subcutaneous Not Given 10/11/24 2011)  insulin  glargine-yfgn (SEMGLEE ) injection 12 Units (has no administration in time range)  umeclidinium bromide  (INCRUSE ELLIPTA ) 62.5 MCG/ACT 1 puff (has no administration in time range)  fluticasone  furoate-vilanterol (BREO ELLIPTA ) 200-25 MCG/ACT 1 puff (has no administration in time range)  cefTRIAXone  (ROCEPHIN ) 2 g in sodium chloride  0.9 % 100 mL IVPB (0 g Intravenous Stopped 10/11/24 1659)  azithromycin  (ZITHROMAX ) 500 mg in sodium chloride  0.9 % 250 mL IVPB ( Intravenous Infusion Verify 10/11/24 1711)  sodium chloride  0.9 % bolus 500 mL (500 mLs Intravenous New Bag/Given 10/11/24 1631)    Clinical Course as of 10/11/24 2045  Fri Oct 11, 2024  1515 Discussed with Dr Barbra for admission [RP]    Clinical Course User Index [RP] Yolande Lamar BROCKS, MD                                 Medical Decision Making Amount and/or Complexity of Data Reviewed Labs: ordered. Radiology: ordered.  Risk Decision regarding hospitalization.   83 year old female previous diagnosis of asthma/COPD?, diabetes, stroke, hypertension, and anxiety presents the emergency department with cough and congestion.   Initial Ddx:  COPD exacerbation, pneumonia, URI, sepsis, CHF  MDM/Course:  Patient presents to the emergency department with URI type symptoms.  Has had pneumonia in the past as well as are also on the differential.  On exam she is somewhat ill-appearing but nontoxic.  Tachycardic.  Satting well on room air.  Does have some rales in her left base.  Chest x-ray without pneumonia.  COVID and flu negative.  CT was obtained to evaluate for occult pneumonia which was positive for left lower lobe pneumonia.  She was started on IV antibiotics.  Performed shared decision making regarding  disposition and she is feeling too ill to go back to her facility and carry out her daily activities as usual.  Discussed with Dr. Barbra from the hospitalist for admission.  This patient presents to the ED for concern of complaints listed in HPI, this involves an extensive number of treatment options, and is a complaint that carries with it a high risk of complications and morbidity. Disposition including potential need for admission considered.   Dispo: Admit to Floor  Additional history obtained from daughter Records reviewed DC Summary The following labs were independently interpreted: Chemistry and show no acute abnormality I independently reviewed the following imaging with scope of interpretation limited to determining acute life threatening conditions related to emergency care: Chest x-ray and agree with the radiologist interpretation with the following exceptions: none I personally reviewed and interpreted cardiac monitoring: sinus tachycardia I personally reviewed and interpreted the pt's EKG: see above for interpretation  I have reviewed the patients home medications and made adjustments as needed Consults: Hospitalist Social Determinants of health:  Geriatric  Portions of this note were generated with Scientist, clinical (histocompatibility and immunogenetics). Dictation errors may occur despite best attempts at proofreading.     Final diagnoses:  Pneumonia of left lower lobe due to infectious organism    ED Discharge Orders     None          Yolande Lamar BROCKS, MD 10/11/24 2045

## 2024-10-12 ENCOUNTER — Inpatient Hospital Stay (HOSPITAL_COMMUNITY)

## 2024-10-12 DIAGNOSIS — J189 Pneumonia, unspecified organism: Secondary | ICD-10-CM | POA: Diagnosis not present

## 2024-10-12 DIAGNOSIS — I10 Essential (primary) hypertension: Secondary | ICD-10-CM

## 2024-10-12 DIAGNOSIS — E1142 Type 2 diabetes mellitus with diabetic polyneuropathy: Secondary | ICD-10-CM | POA: Diagnosis not present

## 2024-10-12 DIAGNOSIS — K219 Gastro-esophageal reflux disease without esophagitis: Secondary | ICD-10-CM | POA: Diagnosis not present

## 2024-10-12 LAB — BASIC METABOLIC PANEL WITH GFR
Anion gap: 10 (ref 5–15)
BUN: 8 mg/dL (ref 8–23)
CO2: 26 mmol/L (ref 22–32)
Calcium: 9.3 mg/dL (ref 8.9–10.3)
Chloride: 103 mmol/L (ref 98–111)
Creatinine, Ser: 0.52 mg/dL (ref 0.44–1.00)
GFR, Estimated: >60 mL/min (ref >60)
Glucose, Bld: 210 mg/dL — ABNORMAL HIGH (ref 70–99)
Potassium: 4.3 mmol/L (ref 3.5–5.1)
Sodium: 138 mmol/L (ref 135–145)

## 2024-10-12 LAB — CBC
HCT: 37.8 % (ref 36.0–46.0)
Hemoglobin: 12.3 g/dL (ref 12.0–15.0)
MCH: 28.2 pg (ref 26.0–34.0)
MCHC: 32.5 g/dL (ref 30.0–36.0)
MCV: 86.7 fL (ref 80.0–100.0)
Platelets: 257 K/uL (ref 150–400)
RBC: 4.36 MIL/uL (ref 3.87–5.11)
RDW: 16.1 % — ABNORMAL HIGH (ref 11.5–15.5)
WBC: 7.2 K/uL (ref 4.0–10.5)
nRBC: 0 % (ref 0.0–0.2)

## 2024-10-12 LAB — GLUCOSE, CAPILLARY
Glucose-Capillary: 183 mg/dL — ABNORMAL HIGH (ref 70–99)
Glucose-Capillary: 208 mg/dL — ABNORMAL HIGH (ref 70–99)
Glucose-Capillary: 205 mg/dL — ABNORMAL HIGH (ref 70–99)
Glucose-Capillary: 228 mg/dL — ABNORMAL HIGH (ref 70–99)
Glucose-Capillary: 160 mg/dL — ABNORMAL HIGH (ref 70–99)

## 2024-10-12 NOTE — Progress Notes (Signed)
 PT Cancellation Note  Patient Details Name: [name] White MRN: [id number] DOB: 1941/07/12   Cancelled Treatment:    Reason Eval/Treat Not Completed: Fatigue/lethargy limiting ability to participate   Montie Metro, PT CLT [phone]  10/12/2024, 10:44 AM

## 2024-10-12 NOTE — TOC Initial Note (Signed)
 Transition of Care [facility]) - Initial/Assessment Note    Patient Details  Name: [name] White MRN: [id number] Date of Birth: [birth year]  [facility] CM/SW Contact:    Lucie Lunger, LCSWA Phone Number: 10/12/2024, 10:33 AM  Clinical Narrative:                 CSW notes per chart that pt arrived from [facility]. CSW spoke to pts daughter to complete assessment. Pts daughter confirms pt is a resident at [facility]. Pt has HH services through [facility] at this time. Pt has a walker and wheelchair to use if needed. Pt has a neb machine in the home also. TOC to follow.   Expected Discharge Plan: Assisted Living Barriers to Discharge: Continued Medical Work up   Patient Goals and CMS Choice Patient states their goals for this hospitalization and ongoing recovery are:: return to ALF CMS Medicare.gov Compare Post Acute Care list provided to:: Patient Represenative (must comment) Choice offered to / list presented to : Adult Children      Expected Discharge Plan and Services In-house Referral: Clinical Social Work Discharge Planning Services: CM Consult Post Acute Care Choice: Home Health Living arrangements for the past 2 months: Assisted Living Facility                                      Prior Living Arrangements/Services Living arrangements for the past 2 months: Assisted Living Facility Lives with:: Facility Resident Patient language and need for interpreter reviewed:: Yes Do you feel safe going back to the place where you live?: Yes      Need for Family Participation in Patient Care: Yes (Comment) Care giver support system in place?: Yes (comment) Current home services: DME Criminal Activity/Legal Involvement Pertinent to Current Situation/Hospitalization: No - Comment as needed  Activities of Daily Living   ADL Screening (condition at time of admission) Independently performs ADLs?: No Does the patient have a NEW difficulty with  bathing/dressing/toileting/self-feeding that is expected to last >3 days?: Yes (Initiates electronic notice to provider for possible OT consult) Does the patient have a NEW difficulty with getting in/out of bed, walking, or climbing stairs that is expected to last >3 days?: Yes (Initiates electronic notice to provider for possible PT consult) Does the patient have a NEW difficulty with communication that is expected to last >3 days?: No Is the patient deaf or have difficulty hearing?: No Does the patient have difficulty seeing, even when wearing glasses/contacts?: No Does the patient have difficulty concentrating, remembering, or making decisions?: No  Permission Sought/Granted                  Emotional Assessment Appearance:: Appears stated age       Alcohol / Substance Use: Not Applicable Psych Involvement: No (comment)  Admission diagnosis:  CAP (community acquired pneumonia) [J18.9] Patient Active Problem List   Diagnosis Date Noted   CAP (community acquired pneumonia) 10/11/2024   Abnormal gait 07/10/2024   Cervical radiculopathy 07/10/2024   Chronic neck pain 07/10/2024   Visual disturbance 07/10/2024   Recurrent UTI 07/09/2024   Hypomagnesemia 05/01/2024   Acute respiratory failure with hypoxemia (HCC) 04/24/2024   CVA (cerebral vascular accident) (HCC) 03/12/2024   Anxiety and depression 03/12/2024   GERD without esophagitis 03/12/2024   Rhabdomyolysis 12/27/2023   Pressure injury of skin 12/27/2023   Status post transsphenoidal pituitary resection 06/04/2020  Pituitary adenoma with extrasellar extension (HCC) 06/04/2020   Pituitary tumor 05/29/2020   Dizzinesses 02/02/2020   Vitamin D  deficiency 01/28/2020   Paroxysmal supraventricular tachycardia 06/19/2014   Rectocele 03/19/2013   Constipation 03/19/2013   HYPERCHOLESTEROLEMIA 02/09/2009   Essential hypertension 02/09/2009   Osteoporosis 02/09/2009   Post-menopausal 02/09/2009   HEAT INTOLERANCE  02/01/2008   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   Hypothyroidism 09/14/2007   Migraine headache 09/14/2007   SUPERFICIAL PHLEBITIS 09/14/2007   MENOPAUSAL SYNDROME 09/14/2007   FATIGUE 09/14/2007   Type 2 diabetes mellitus with peripheral neuropathy (HCC) 09/14/2007   PCP:  [name], MD Pharmacy:   [facility], [place] - [address] [place] [address] Phone: [phone] Fax: [phone]  [facility] - [place], [place] - [address], [address] [place] [address] Phone: [phone] Fax: [phone]     Social Drivers of Health (SDOH) Social History: SDOH Screenings   Food Insecurity: No Food Insecurity (10/11/2024)  Housing: Low Risk  (10/11/2024)  Transportation Needs: No Transportation Needs (10/11/2024)  Utilities: Not At Risk (10/11/2024)  Depression (PHQ2-9): Low Risk  (01/28/2020)  Social Connections: Socially Isolated (10/11/2024)  Tobacco Use: Low Risk  (10/11/2024)   SDOH Interventions:     Readmission Risk Interventions    10/12/2024   10:32 AM 05/23/2024   10:18 AM 04/25/2024    1:46 PM  Readmission Risk Prevention Plan  Transportation Screening Complete Complete Complete  PCP or Specialist Appt within 3-5 Days   Not Complete  HRI or Home Care Consult Complete Complete Complete  Social Work Consult for Recovery Care Planning/Counseling Complete Complete Complete  Palliative Care Screening Not Applicable Not Applicable Not Complete  Medication Review Oceanographer) Complete Complete Complete

## 2024-10-12 NOTE — Progress Notes (Signed)
 PROGRESS NOTE  [name] White, is a 83 y.o. female, DOB - 03/24/1941, FMW:[id number]  Admit date - 10/11/2024   Admitting Physician Lang JINNY Peel, DO  Outpatient Primary MD for the patient is [name], MD  LOS - 1  Chief Complaint  Patient presents with   Nasal Congestion   Cough       Brief Narrative:  83 y.o. female with medical history significant of diabetes, hypertension, hypothyroidism, dementia, malnutrition, history of CVA, asthma admitted on 10/11/2024 with current acquired pneumonia resulting in acute hypoxic respiratory failure and altered mentation    -Assessment and Plan: 1)CAP--POA - Cough dyspnea and hypoxia patient -Continue prednisone  - Continue azithromycin /Rocephin , continue bronchodilators and mucolytic's  2) acute hypoxic respiratory failure--- due to 1 above - Currently requiring 2 L of oxygen via nasal cannula  3)HTN--continue PTA BP meds  4) acute metabolic encephalopathy--- due to #1 and #2 above - CT head without acute finding - Anticipate improvement in mentation with improvement in #1 or 2 above  5)GERD--continue Protonix   6)DM2-metformin  on hold -Lantus  insulin  as ordered -Anticipate worsening hyperglycemia while on steroid Use Novolog /Humalog Sliding scale insulin  with Accu-Cheks/Fingersticks as ordered   7)HLD----continue atorvastatin   8)Depression--mentation improving as noted above in #4 - Continue Effexor  and Trintellix   Status is: Inpatient   Disposition: The patient is from: ALF [facility]              Anticipated d/c is to: ALF              Anticipated d/c date is: 2 days              Patient currently is not medically stable to d/c. Barriers: Not Clinically Stable-   Code Status :  -  Code Status: Limited: Do not attempt resuscitation (DNR) -DNR-LIMITED -Do Not Intubate/DNI    Family Communication:   Family bedside bedside  DVT Prophylaxis  :   - SCDs enoxaparin  (LOVENOX ) injection 40 mg Start: 10/11/24 2200   Lab  Results  Component Value Date   PLT 257 10/12/2024    Inpatient Medications  Scheduled Meds:  aspirin  EC  81 mg Oral Daily   atorvastatin   40 mg Oral Daily   enoxaparin  (LOVENOX ) injection  40 mg Subcutaneous Q24H   fluticasone  furoate-vilanterol  1 puff Inhalation Daily   guaiFENesin   600 mg Oral BID   insulin  aspart  0-20 Units Subcutaneous TID WC   insulin  aspart  0-5 Units Subcutaneous QHS   insulin  glargine-yfgn  12 Units Subcutaneous QHS   ipratropium-albuterol   3 mL Nebulization Q6H   levothyroxine   50 mcg Oral Q0600   pantoprazole   40 mg Oral Daily   [START ON 10/13/2024] predniSONE   40 mg Oral Q breakfast   umeclidinium bromide   1 puff Inhalation Daily   venlafaxine  XR  150 mg Oral Q breakfast   vortioxetine  HBr  10 mg Oral Daily   Continuous Infusions:  azithromycin  500 mg (10/12/24 1555)   cefTRIAXone  (ROCEPHIN )  IV 2 g (10/12/24 1802)   PRN Meds:.acetaminophen  **OR** acetaminophen , ondansetron  **OR** ondansetron  (ZOFRAN ) IV, polyethylene glycol, traZODone    Anti-infectives (From admission, onward)    Start     Dose/Rate Route Frequency Ordered Stop   10/12/24 1600  cefTRIAXone  (ROCEPHIN ) 2 g in sodium chloride  0.9 % 100 mL IVPB        2 g 200 mL/hr over 30 Minutes Intravenous Every 24 hours 10/11/24 1616 10/17/24 1559   10/12/24 1600  azithromycin  (ZITHROMAX ) 500 mg in sodium  chloride 0.9 % 250 mL IVPB        500 mg 250 mL/hr over 60 Minutes Intravenous Every 24 hours 10/11/24 1616 10/17/24 1559   10/11/24 1500  cefTRIAXone  (ROCEPHIN ) 2 g in sodium chloride  0.9 % 100 mL IVPB        2 g 200 mL/hr over 30 Minutes Intravenous  Once 10/11/24 1458 10/11/24 1659   10/11/24 1500  azithromycin  (ZITHROMAX ) 500 mg in sodium chloride  0.9 % 250 mL IVPB        500 mg 250 mL/hr over 60 Minutes Intravenous  Once 10/11/24 1458 10/11/24 1810         Subjective: [name] White today has no fevers, no emesis,  No chest pain,    - Mentation improving slowly --Dyspnea  persist, continues to require oxygen at 2 L per nasal cannula   Objective: Vitals:   10/12/24 0513 10/12/24 0839 10/12/24 1339 10/12/24 1437  BP: (!) 94/58  93/64   Pulse: 99  (!) 101   Resp: 18  19   Temp: 98.5 F (36.9 C)  (!) 97.3 F (36.3 C)   TempSrc:   Oral   SpO2: 96% 100% 99% 97%  Weight:      Height:        Intake/Output Summary (Last 24 hours) at 10/12/2024 1910 Last data filed at 10/12/2024 1700 Gross per 24 hour  Intake 580 ml  Output --  Net 580 ml   Filed Weights   10/11/24 1044  Weight: 59 kg    Physical Exam  Gen:- Awake , more Alert,   HEENT:- [place].AT, No sclera icterus Nose- [place] 2L/min Neck-Supple Neck,No JVD,.  Lungs-improving air movement, no wheezing CV- S1, S2 normal, regular  Abd-  +ve B.Sounds, Abd Soft, No tenderness,    Extremity/Skin:- No  edema, pedal pulses present  Psych-affect is flat, per family report mentation improving but not quite back to baseline  neuro-generalized weakness, no new focal deficits, no tremors  Data Reviewed: I have personally reviewed following labs and imaging studies  CBC: Recent Labs  Lab 10/11/24 1121 10/12/24 0435  WBC 10.0 7.2  NEUTROABS 6.6  --   HGB 12.2 12.3  HCT 38.1 37.8  MCV 89.6 86.7  PLT 236 257   Basic Metabolic Panel: Recent Labs  Lab 10/11/24 1121 10/12/24 0435  NA 134* 138  K 4.3 4.3  CL 100 103  CO2 21* 26  GLUCOSE 119* 210*  BUN 8 8  CREATININE 0.55 0.52  CALCIUM  9.1 9.3   GFR: Estimated Creatinine Clearance: 49.6 mL/min (by C-G formula based on SCr of 0.52 mg/dL). Liver Function Tests: Recent Labs  Lab 10/11/24 1121  AST 33  ALT 13  ALKPHOS 40  BILITOT 0.8  PROT 6.6  ALBUMIN 3.6    Recent Results (from the past 240 hours)  Resp panel by RT-PCR (RSV, Flu A&B, Covid) Anterior Nasal Swab     Status: None   Collection Time: 10/11/24 10:48 AM   Specimen: Anterior Nasal Swab  Result Value Ref Range Status   SARS Coronavirus 2 by RT PCR NEGATIVE NEGATIVE Final     Comment: (NOTE) SARS-CoV-2 target nucleic acids are NOT DETECTED.  The SARS-CoV-2 RNA is generally detectable in upper respiratory specimens during the acute phase of infection. The lowest concentration of SARS-CoV-2 viral copies this assay can detect is 138 copies/mL. A negative result does not preclude SARS-Cov-2 infection and should not be used as the sole basis for treatment or other patient management  decisions. A negative result may occur with  improper specimen collection/handling, submission of specimen other than nasopharyngeal swab, presence of viral mutation(s) within the areas targeted by this assay, and inadequate number of viral copies(<138 copies/mL). A negative result must be combined with clinical observations, patient history, and epidemiological information. The expected result is Negative.  Fact Sheet for Patients:  BloggerCourse.com  Fact Sheet for Healthcare Providers:  SeriousBroker.it  This test is no t yet approved or cleared by the [place]  FDA and  has been authorized for detection and/or diagnosis of SARS-CoV-2 by FDA under an Emergency Use Authorization (EUA). This EUA will remain  in effect (meaning this test can be used) for the duration of the COVID-19 declaration under Section 564(b)(1) of the Act, 21 U.S.C.section 360bbb-3(b)(1), unless the authorization is terminated  or revoked sooner.       Influenza A by PCR NEGATIVE NEGATIVE Final   Influenza B by PCR NEGATIVE NEGATIVE Final    Comment: (NOTE) The Xpert Xpress SARS-CoV-2/FLU/RSV plus assay is intended as an aid in the diagnosis of influenza from Nasopharyngeal swab specimens and should not be used as a sole basis for treatment. Nasal washings and aspirates are unacceptable for Xpert Xpress SARS-CoV-2/FLU/RSV testing.  Fact Sheet for Patients: BloggerCourse.com  Fact Sheet for Healthcare  Providers: SeriousBroker.it  This test is not yet approved or cleared by the [place]  FDA and has been authorized for detection and/or diagnosis of SARS-CoV-2 by FDA under an Emergency Use Authorization (EUA). This EUA will remain in effect (meaning this test can be used) for the duration of the COVID-19 declaration under Section 564(b)(1) of the Act, 21 U.S.C. section 360bbb-3(b)(1), unless the authorization is terminated or revoked.     Resp Syncytial Virus by PCR NEGATIVE NEGATIVE Final    Comment: (NOTE) Fact Sheet for Patients: BloggerCourse.com  Fact Sheet for Healthcare Providers: SeriousBroker.it  This test is not yet approved or cleared by the [place]  FDA and has been authorized for detection and/or diagnosis of SARS-CoV-2 by FDA under an Emergency Use Authorization (EUA). This EUA will remain in effect (meaning this test can be used) for the duration of the COVID-19 declaration under Section 564(b)(1) of the Act, 21 U.S.C. section 360bbb-3(b)(1), unless the authorization is terminated or revoked.  Performed at [facility], [address], [place], [place] [address]   Blood culture (routine x 2)     Status: None (Preliminary result)   Collection Time: 10/11/24  3:25 PM   Specimen: BLOOD RIGHT ARM  Result Value Ref Range Status   Specimen Description BLOOD RIGHT ARM  Final   Special Requests   Final    BOTTLES DRAWN AEROBIC AND ANAEROBIC Blood Culture adequate volume   Culture   Final    NO GROWTH < 24 HOURS Performed at [facility], [address] [place], [place] [address]    Report Status PENDING  Incomplete  Blood culture (routine x 2)     Status: None (Preliminary result)   Collection Time: 10/11/24  3:25 PM   Specimen: BLOOD LEFT ARM  Result Value Ref Range Status   Specimen Description BLOOD LEFT ARM  Final   Special Requests   Final    BOTTLES DRAWN AEROBIC AND  ANAEROBIC Blood Culture adequate volume   Culture   Final    NO GROWTH < 24 HOURS Performed at [facility], [address] [place], [place] [address]    Report Status PENDING  Incomplete    Radiology Studies: CT  HEAD WO CONTRAST ( ) Result Date: 10/12/2024 EXAM: CT HEAD WITHOUT CONTRAST 10/12/2024 05:57:18 PM TECHNIQUE: CT of the head was performed without the administration of intravenous contrast. Automated exposure control, iterative reconstruction, and/or weight based adjustment of the mA/kV was utilized to reduce the radiation dose to as low as reasonably achievable. COMPARISON: Comparison made with prior CT from 07/04/2024 as well as earlier studies. CLINICAL HISTORY: Mental status change, unknown cause. FINDINGS: BRAIN AND VENTRICLES: Patchy hyperdensity involving the supratentorial cerebral white matter consistent with chronic 2-vessel ischemic disease, stable. Few small remote cerebellar infarcts, right greater than left, also stable. No acute intracranial hemorrhage. No visible acute large vessel territory infarct. No hydrocephalus. No extra-axial collection. No mass effect or midline shift. 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 No abnormal hyperdense vessel. Calcified atherosclerosis present about the skull base. ORBITS: No acute abnormality. SINUSES: No acute abnormality. SOFT TISSUES AND SKULL: No acute soft tissue abnormality. No skull fracture. IMPRESSION: 1. No acute intracranial abnormality. 2. 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 3. Chronic microvascular ischemic disease with a few small remote cerebellar infarcts, stable. Electronically signed by: Morene Hoard MD 10/12/2024 06:33 PM EDT RP Workstation: [id number]   CT Chest Wo Contrast Result Date: 10/11/2024 EXAM: CT CHEST WITHOUT CONTRAST 10/11/2024 01:05:02 PM  TECHNIQUE: CT of the chest was performed without the administration of intravenous contrast. Multiplanar reformatted images are provided for review. Automated exposure control, iterative reconstruction, and/or weight based adjustment of the mA/kV was utilized to reduce the radiation dose to as low as reasonably achievable. COMPARISON: 05/22/2024 CLINICAL HISTORY: pna type symptoms. FINDINGS: MEDIASTINUM: Heart and pericardium are unremarkable. The central airways are clear. Aortic atherosclerosis. LYMPH NODES: No mediastinal, hilar or axillary lymphadenopathy. LUNGS AND PLEURA: Minimal right pleural effusion is noted. Mild left posterior basilar opacity is noted concerning for subsegmental atelectasis or possibly pneumonia. No pneumothorax. SOFT TISSUES/BONES: Moderate compression deformity of T12 vertebral body is noted concerning for fracture of indeterminate age. No acute abnormality of the soft tissues. UPPER ABDOMEN: Limited images of the upper abdomen demonstrates no acute abnormality. IMPRESSION: 1. Mild left posterior basilar opacity, suspicious for subsegmental atelectasis or pneumonia. 2. Minimal right pleural effusion. 3. Moderate T12 vertebral body compression deformity, age indeterminate. Electronically signed by: Lynwood Seip MD 10/11/2024 02:30 PM EDT RP Workstation: [id number]   DG Chest 2 View Result Date: 10/11/2024 EXAM: 2 VIEW(S) XRAY OF THE CHEST 10/11/2024 11:11:51 AM COMPARISON: 05/29/2024 CLINICAL HISTORY: cough. Per Triage: Pt arrived [facility] from [facility] in [place]. Pt has had coughing, nasal congestion and fatigue since yesterday. FINDINGS: LUNGS AND PLEURA: Minimal bibasilar subsegmental atelectasis. Minimal bilateral pleural effusions are noted. No pulmonary edema. No pneumothorax. HEART AND MEDIASTINUM: No acute abnormality of the cardiac and mediastinal silhouettes. BONES AND SOFT TISSUES: No acute osseous abnormality. IMPRESSION: 1. Minimal bilateral pleural effusions. 2. Minimal  bibasilar subsegmental atelectasis. Electronically signed by: Lynwood Seip MD 10/11/2024 11:27 AM EDT RP Workstation: [id number]   Scheduled Meds:  aspirin  EC  81 mg Oral Daily   atorvastatin   40 mg Oral Daily   enoxaparin  (LOVENOX ) injection  40 mg Subcutaneous Q24H   fluticasone  furoate-vilanterol  1 puff Inhalation Daily   guaiFENesin   600 mg Oral BID   insulin  aspart  0-20 Units Subcutaneous TID WC   insulin  aspart  0-5 Units Subcutaneous QHS   insulin  glargine-yfgn  12 Units Subcutaneous QHS   ipratropium-albuterol   3  mL Nebulization Q6H   levothyroxine   50 mcg Oral Q0600   pantoprazole   40 mg Oral Daily   [START ON 10/13/2024] predniSONE   40 mg Oral Q breakfast   umeclidinium bromide   1 puff Inhalation Daily   venlafaxine  XR  150 mg Oral Q breakfast   vortioxetine  HBr  10 mg Oral Daily   Continuous Infusions:  azithromycin  500 mg (10/12/24 1555)   cefTRIAXone  (ROCEPHIN )  IV 2 g (10/12/24 1802)    LOS: 1 day   Rendall Carwin M.D on 10/12/2024 at 7:10 PM  Go to [email or web address] - for contact info  [facility] - Office  [phone]  If 7PM-7AM, please contact night-coverage [email or web address] 10/12/2024, 7:10 PM

## 2024-10-13 DIAGNOSIS — E1142 Type 2 diabetes mellitus with diabetic polyneuropathy: Secondary | ICD-10-CM | POA: Diagnosis not present

## 2024-10-13 DIAGNOSIS — I10 Essential (primary) hypertension: Secondary | ICD-10-CM | POA: Diagnosis not present

## 2024-10-13 DIAGNOSIS — J189 Pneumonia, unspecified organism: Secondary | ICD-10-CM | POA: Diagnosis not present

## 2024-10-13 LAB — GLUCOSE, CAPILLARY
Glucose-Capillary: 125 mg/dL — ABNORMAL HIGH (ref 70–99)
Glucose-Capillary: 228 mg/dL — ABNORMAL HIGH (ref 70–99)
Glucose-Capillary: 252 mg/dL — ABNORMAL HIGH (ref 70–99)
Glucose-Capillary: 110 mg/dL — ABNORMAL HIGH (ref 70–99)

## 2024-10-13 MED ORDER — SODIUM CHLORIDE 0.9 % IV SOLN: INTRAVENOUS | Status: AC

## 2024-10-13 MED ORDER — IPRATROPIUM-ALBUTEROL 0.5-2.5 (3) MG/3ML IN SOLN
3.0000 mL | Freq: Three times a day (TID) | RESPIRATORY_TRACT | Status: DC
  Administered 2024-10-14 – 2024-10-16 (×7): 3 mL via RESPIRATORY_TRACT
  Filled 2024-10-13 (×8): qty 3

## 2024-10-13 MED ORDER — HYDROCORTISONE SOD SUC (PF) 100 MG IJ SOLR
50.0000 mg | Freq: Three times a day (TID) | INTRAMUSCULAR | Status: AC
  Administered 2024-10-13 – 2024-10-15 (×6): 50 mg via INTRAVENOUS
  Filled 2024-10-13 (×6): qty 2

## 2024-10-13 MED ORDER — MIDODRINE HCL 5 MG PO TABS
10.0000 mg | ORAL_TABLET | Freq: Once | ORAL | Status: AC
  Administered 2024-10-13: 10 mg via ORAL
  Filled 2024-10-13: qty 2

## 2024-10-13 MED ORDER — ALBUTEROL SULFATE (2.5 MG/3ML) 0.083% IN NEBU
2.5000 mg | INHALATION_SOLUTION | RESPIRATORY_TRACT | Status: DC | PRN
  Administered 2024-10-16: 2.5 mg via RESPIRATORY_TRACT
  Filled 2024-10-13: qty 3

## 2024-10-13 MED ORDER — METHYLPREDNISOLONE SODIUM SUCC 40 MG IJ SOLR: 40.0000 mg | Freq: Two times a day (BID) | INTRAMUSCULAR | Status: DC

## 2024-10-13 MED ORDER — PREGABALIN 75 MG PO CAPS
75.0000 mg | ORAL_CAPSULE | Freq: Two times a day (BID) | ORAL | Status: DC
Start: 2024-10-13 — End: 2024-10-16
  Administered 2024-10-14 – 2024-10-16 (×5): 75 mg via ORAL
  Filled 2024-10-13 (×6): qty 1

## 2024-10-13 NOTE — Evaluation (Signed)
 Physical Therapy Evaluation Patient Details Name: [name] White MRN: [id number] DOB: 03/18/1941 Today's Date: 10/13/2024  History of Present Illness  HPI: [name] White is a 83 y.o. female with medical history significant of diabetes, hypertension, hypothyroidism, dementia, malnutrition, history of CVA, asthma.  Patient is a resident of [facility] and has been having coughing, congestion, fatigue since yesterday.  Due to her symptoms, she was brought here for evaluation.  Chest x-ray here shows pneumonia.  No white count, but is is borderline hypoxic.  She did have something similar back in May and early June.  She was admitted with pneumonia and COPD exacerbation and discharged on steroids and in a long-acting corticosteroid inhalers.  She did see pulmonology who is not convinced of her COPD diagnosis.  Her Breo was stopped, but she continued on the short acting inhalers as needed.  Clinical Impression  PT more alert today.  Pt able to complete LE exercises with verbal/tactile cuing.  Pt able to sit with mod assist and balance self in sitting position for five minutes.  PT unable to transfer at this time.        If plan is discharge home, recommend the following: A lot of help with walking and/or transfers;A lot of help with bathing/dressing/bathroom   Can travel by private vehicle   No    Equipment Recommendations None recommended by PT  Recommendations for Other Services    none   Functional Status Assessment Patient has had a recent decline in their functional status and demonstrates the ability to make significant improvements in function in a reasonable and predictable amount of time.     Precautions / Restrictions Precautions Precautions: Fall Restrictions Weight Bearing Restrictions Per Provider Order: No      Mobility  Bed Mobility Overal bed mobility: Needs Assistance Bed Mobility: Supine to Sit, Sit to Supine     Supine to sit: Min assist Sit to supine: Contact  guard assist   General bed mobility comments: PT sat edge of bed for five minues.  Therapist attempted to get pt to try and transfer but she would not.  Pt would not verbalize why she would not transfer.  At this point pt laid back down into bed on her own.  Pt was low in the bed but would not assist therapist to move herself to the head of the bed.  Pt breathing noted to be increased most likely pt  was fatigued at this time. Nurse in room to give pt insulin .             General transfer comment: Pt wound not attempt                   Pertinent Vitals/Pain Pain Assessment Pain Assessment: Faces Faces Pain Scale: No hurt    Home Living  PT from [facility] assisted living                        Prior Function  Unknown                      Extremity/Trunk Assessment        Lower Extremity Assessment Lower Extremity Assessment: Generalized weakness       Communication    Able to verbalize and respond to command inconsistently                  Exercises General Exercises - Lower Extremity Ankle Circles/Pumps: AROM,  Both, 5 reps Heel Slides: AROM, Both, 10 reps Hip ABduction/ADduction: AROM, Both, 10 reps   Assessment/Plan    PT Assessment Patient needs continued PT services  PT Problem List Decreased strength;Decreased activity tolerance;Decreased mobility;Decreased cognition       PT Treatment Interventions Therapeutic exercise;Functional mobility training    PT Goals (Current goals can be found in the Care Plan section)  Acute Rehab PT Goals Patient Stated Goal: unable to state; theapist goal pt to be able to transfer with min assis to reduce stress on staff. PT Goal Formulation: Patient unable to participate in goal setting Time For Goal Achievement: 10/17/24 Potential to Achieve Goals: Good    Frequency Min 3X/week        AM-PAC PT 6 Clicks Mobility  Outcome Measure Help needed turning from your back to your side while in  a flat bed without using bedrails?: A Little Help needed moving from lying on your back to sitting on the side of a flat bed without using bedrails?: A Little Help needed moving to and from a bed to a chair (including a wheelchair)?: A Lot Help needed standing up from a chair using your arms (e.g., wheelchair or bedside chair)?: A Lot Help needed to walk in hospital room?: A Lot Help needed climbing 3-5 steps with a railing? : Total 6 Click Score: 13    End of Session Equipment Utilized During Treatment: Oxygen Activity Tolerance: Patient limited by fatigue Patient left: in bed;with bed alarm set;with call bell/phone within reach Nurse Communication: Mobility status PT Visit Diagnosis: Other abnormalities of gait and mobility (R26.89);Muscle weakness (generalized) (M62.81);Difficulty in walking, not elsewhere classified (R26.2)    Time: 8889-8874 PT Time Calculation (min) (ACUTE ONLY): 15 min   Charges:   PT Evaluation $PT Eval Low Complexity: 1 Low   PT General Charges $$ ACUTE PT VISIT: 1 Visit         Montie Metro, PT CLT [phone]  10/13/2024, 11:28 AM

## 2024-10-13 NOTE — Progress Notes (Signed)
   10/13/24 1300  Assess: MEWS Score  BP (!) 90/47  Pulse Rate (!) 108  Assess: MEWS Score  MEWS Temp 0  MEWS Systolic 1  MEWS Pulse 1  MEWS RR 0  MEWS LOC 0  MEWS Score 2  MEWS Score Color Yellow  Assess: if the MEWS score is Yellow or Red  Were vital signs accurate and taken at a resting state? Yes  Does the patient meet 2 or more of the SIRS criteria? No  MEWS guidelines implemented  Yes, yellow  Treat  MEWS Interventions Considered administering scheduled or prn medications/treatments as ordered  Take Vital Signs  Increase Vital Sign Frequency  Yellow: Q2hr x1, continue Q4hrs until patient remains green for 12hrs  Escalate  MEWS: Escalate Yellow: Discuss with charge nurse and consider notifying provider and/or RRT  Notify: Charge Nurse/RN  Name of Charge Nurse/RN Notified mary ann,RN  Provider Notification  Provider Name/Title Dr. Rendall  Date Provider Notified 10/13/24  Time Provider Notified 1300  Method of Notification Page  Notification Reason Other (Comment) (yellow mews)  Provider response See new orders  Assess: SIRS CRITERIA  SIRS Temperature  0  SIRS Respirations  0  SIRS Pulse 1  SIRS WBC 0  SIRS Score Sum  1

## 2024-10-13 NOTE — Progress Notes (Signed)
   10/13/24 1241  Vitals  Temp 98.3 F (36.8 C)  Temp Source Oral  BP (!) 85/75  BP Location Left Arm  BP Method Automatic  Patient Position (if appropriate) Lying  Pulse Rate 100  Pulse Rate Source Dinamap  Resp 17  MEWS COLOR  MEWS Score Color Green  Oxygen Therapy  SpO2 100 %  MEWS Score  MEWS Temp 0  MEWS Systolic 1  MEWS Pulse 0  MEWS RR 0  MEWS LOC 0  MEWS Score 1   Dr. Rendall made aware of BP, see new orders

## 2024-10-13 NOTE — TOC Progression Note (Signed)
 Transition of Care [facility]) - Progression Note    Patient Details  Name: RHIAN ASEBEDO MRN: [id number] Date of Birth: [birth year]  [facility] CM/SW Contact  Hoy DELENA Bigness, LCSW Phone Number: 10/13/2024, 10:37 AM  Clinical Narrative:    Spoke with Tammy at [facility] and confirmed plan to have someone evaluate pt in person on Monday prior to pt being discharged.    Expected Discharge Plan: Assisted Living Barriers to Discharge: Continued Medical Work up               Expected Discharge Plan and Services In-house Referral: Clinical Social Work Discharge Planning Services: CM Consult Post Acute Care Choice: Home Health Living arrangements for the past 2 months: Assisted Living Facility                                       Social Drivers of Health (SDOH) Interventions SDOH Screenings   Food Insecurity: No Food Insecurity (10/11/2024)  Housing: Low Risk  (10/11/2024)  Transportation Needs: No Transportation Needs (10/11/2024)  Utilities: Not At Risk (10/11/2024)  Depression (PHQ2-9): Low Risk  (01/28/2020)  Social Connections: Socially Isolated (10/11/2024)  Tobacco Use: Low Risk  (10/11/2024)    Readmission Risk Interventions    10/12/2024   10:32 AM 05/23/2024   10:18 AM 04/25/2024    1:46 PM  Readmission Risk Prevention Plan  Transportation Screening Complete Complete Complete  PCP or Specialist Appt within 3-5 Days   Not Complete  HRI or Home Care Consult Complete Complete Complete  Social Work Consult for Recovery Care Planning/Counseling Complete Complete Complete  Palliative Care Screening Not Applicable Not Applicable Not Complete  Medication Review Oceanographer) Complete Complete Complete

## 2024-10-13 NOTE — Progress Notes (Signed)
   10/13/24 1655  Assess: MEWS Score  BP (!) 73/35  MAP (mmHg) (!) 48  Assess: MEWS Score  MEWS Temp 0  MEWS Systolic 2  MEWS Pulse 2  MEWS RR 0  MEWS LOC 0  MEWS Score 4  MEWS Score Color Red  Assess: if the MEWS score is Yellow or Red  Were vital signs accurate and taken at a resting state? Yes  Does the patient meet 2 or more of the SIRS criteria? No  MEWS guidelines implemented  Yes, red  Treat  MEWS Interventions Considered administering scheduled or prn medications/treatments as ordered  Take Vital Signs  Increase Vital Sign Frequency  Red: Q1hr x2, continue Q4hrs until patient remains green for 12hrs  Escalate  MEWS: Escalate Red: Discuss with charge nurse and notify provider. Consider notifying RRT. If remains red for 2 hours consider need for higher level of care  Notify: Charge Nurse/RN  Name of Charge Nurse/RN Notified [email or web address]  Provider Notification  Provider Name/Title Dr. Rendall  Date Provider Notified 10/13/24  Time Provider Notified 1700  Method of Notification Page  Notification Reason Other (Comment) (RED MEWS)  Provider response See new orders

## 2024-10-13 NOTE — Progress Notes (Addendum)
 PROGRESS NOTE  [name] White, is a 83 y.o. female, DOB - 03-28-1941, FMW:[id number]  Admit date - 10/11/2024   Admitting Physician Lang JINNY Peel, DO  Outpatient Primary MD for the patient is [name], MD  LOS - 2  Chief Complaint  Patient presents with   Nasal Congestion   Cough       Brief Narrative:  83 y.o. female with medical history significant of diabetes, hypertension, hypothyroidism, dementia, malnutrition, history of CVA, asthma admitted on 10/11/2024 with current acquired pneumonia resulting in acute hypoxic respiratory failure and altered mentation    -Assessment and Plan: 1)CAP--POA - Cough dyspnea and hypoxia patient -IV hydrocortisone  as ordered especially with soft BPs - Continue azithromycin /Rocephin , continue bronchodilators and mucolytic's  2) acute hypoxic respiratory failure--- due to 1 above - Currently requiring 2 L of oxygen via nasal cannula  3) acute metabolic encephalopathy--- due to #1 and #2 above - CT head without acute finding - Anticipate improvement in mentation with improvement in #1 or 2 above - Patient's daughter [name] requested that we restart patient's Lyrica  as she believes that stopping the Lyrica  will actually make patient mental status deteriorate  4) acute COPD exacerbation--due to 1 above,  - IV steroids and antibiotics and bronchodilators as above #1  5)GERD--continue Protonix   6)DM2-metformin  on hold -Lantus  insulin  as ordered -Anticipate worsening hyperglycemia while on steroid Use Novolog /Humalog Sliding scale insulin  with Accu-Cheks/Fingersticks as ordered   7)HLD----continue atorvastatin   8)Depression--mentation improving as noted above in #4 - Continue Effexor  and Trintellix   Status is: Inpatient   Disposition: The patient is from: ALF [facility]              Anticipated d/c is to: ALF              Anticipated d/c date is: 2 days              Patient currently is not medically stable to d/c. Barriers: Not  Clinically Stable-   Code Status :  -  Code Status: Limited: Do not attempt resuscitation (DNR) -DNR-LIMITED -Do Not Intubate/DNI    Family Communication:   Family bedside bedside  Discussed with patient's daughter [name] White   DVT Prophylaxis  :   - SCDs enoxaparin  (LOVENOX ) injection 40 mg Start: 10/11/24 2200   Lab Results  Component Value Date   PLT 257 10/12/2024    Inpatient Medications  Scheduled Meds:  aspirin  EC  81 mg Oral Daily   atorvastatin   40 mg Oral Daily   enoxaparin  (LOVENOX ) injection  40 mg Subcutaneous Q24H   fluticasone  furoate-vilanterol  1 puff Inhalation Daily   guaiFENesin   600 mg Oral BID   insulin  aspart  0-20 Units Subcutaneous TID WC   insulin  aspart  0-5 Units Subcutaneous QHS   insulin  glargine-yfgn  12 Units Subcutaneous QHS   ipratropium-albuterol   3 mL Nebulization Q6H   levothyroxine   50 mcg Oral Q0600   methylPREDNISolone  (SOLU-MEDROL ) injection  40 mg Intravenous Q12H   pantoprazole   40 mg Oral Daily   pregabalin   75 mg Oral BID   umeclidinium bromide   1 puff Inhalation Daily   venlafaxine  XR  150 mg Oral Q breakfast   vortioxetine  HBr  10 mg Oral Daily   Continuous Infusions:  sodium chloride  143 mL/hr at 10/13/24 1322   azithromycin  500 mg (10/13/24 1637)   cefTRIAXone  (ROCEPHIN )  IV 2 g (10/13/24 1557)   PRN Meds:.acetaminophen  **OR** acetaminophen , ondansetron  **OR** ondansetron  (ZOFRAN ) IV, polyethylene glycol, traZODone   Anti-infectives (From admission, onward)    Start     Dose/Rate Route Frequency Ordered Stop   10/12/24 1600  cefTRIAXone  (ROCEPHIN ) 2 g in sodium chloride  0.9 % 100 mL IVPB        2 g 200 mL/hr over 30 Minutes Intravenous Every 24 hours 10/11/24 1616 10/17/24 1559   10/12/24 1600  azithromycin  (ZITHROMAX ) 500 mg in sodium chloride  0.9 % 250 mL IVPB        500 mg 250 mL/hr over 60 Minutes Intravenous Every 24 hours 10/11/24 1616 10/17/24 1559   10/11/24 1500  cefTRIAXone  (ROCEPHIN ) 2 g in sodium  chloride 0.9 % 100 mL IVPB        2 g 200 mL/hr over 30 Minutes Intravenous  Once 10/11/24 1458 10/11/24 1659   10/11/24 1500  azithromycin  (ZITHROMAX ) 500 mg in sodium chloride  0.9 % 250 mL IVPB        500 mg 250 mL/hr over 60 Minutes Intravenous  Once 10/11/24 1458 10/11/24 1810       Subjective: [name] White today has no fevers, no emesis,  No chest pain,    - -Appetite is not great her brother was helping to feed earlier today - Patient's daughter [name] visited she is requesting to restart patient's Lyrica  - Soft BP noted in the setting of poor oral intake---IV fluids ordered  Objective: Vitals:   10/13/24 1300 10/13/24 1503 10/13/24 1514 10/13/24 1600  BP: (!) 90/47  (!) 147/120 (!) 78/33  Pulse: (!) 108  (!) 116 (!) 115  Resp:      Temp:   98.7 F (37.1 C)   TempSrc:   Oral   SpO2:  (!) 88%    Weight:      Height:        Intake/Output Summary (Last 24 hours) at 10/13/2024 1650 Last data filed at 10/13/2024 1539 Gross per 24 hour  Intake 880 ml  Output 400 ml  Net 480 ml   Filed Weights   10/11/24 1044  Weight: 59 kg    Physical Exam  Gen:- Awake , more Alert,   HEENT:- [place].AT, No sclera icterus Nose- [name] 2L/min Neck-Supple Neck,No JVD,.  Lungs-improving air movement, no wheezing CV- S1, S2 normal, regular  Abd-  +ve B.Sounds, Abd Soft, No tenderness,    Extremity/Skin:- No  edema, pedal pulses present  Psych-affect is flat, per family report mentation improving but not quite back to baseline  neuro-generalized weakness, no new focal deficits, no tremors  Data Reviewed: I have personally reviewed following labs and imaging studies  CBC: Recent Labs  Lab 10/11/24 1121 10/12/24 0435  WBC 10.0 7.2  NEUTROABS 6.6  --   HGB 12.2 12.3  HCT 38.1 37.8  MCV 89.6 86.7  PLT 236 257   Basic Metabolic Panel: Recent Labs  Lab 10/11/24 1121 10/12/24 0435  NA 134* 138  K 4.3 4.3  CL 100 103  CO2 21* 26  GLUCOSE 119* 210*  BUN 8 8  CREATININE 0.55 0.52   CALCIUM  9.1 9.3   GFR: Estimated Creatinine Clearance: 49.6 mL/min (by C-G formula based on SCr of 0.52 mg/dL). Liver Function Tests: Recent Labs  Lab 10/11/24 1121  AST 33  ALT 13  ALKPHOS 40  BILITOT 0.8  PROT 6.6  ALBUMIN 3.6    Recent Results (from the past 240 hours)  Resp panel by RT-PCR (RSV, Flu A&B, Covid) Anterior Nasal Swab     Status: None   Collection Time: 10/11/24 10:48 AM  Specimen: Anterior Nasal Swab  Result Value Ref Range Status   SARS Coronavirus 2 by RT PCR NEGATIVE NEGATIVE Final    Comment: (NOTE) SARS-CoV-2 target nucleic acids are NOT DETECTED.  The SARS-CoV-2 RNA is generally detectable in upper respiratory specimens during the acute phase of infection. The lowest concentration of SARS-CoV-2 viral copies this assay can detect is 138 copies/mL. A negative result does not preclude SARS-Cov-2 infection and should not be used as the sole basis for treatment or other patient management decisions. A negative result may occur with  improper specimen collection/handling, submission of specimen other than nasopharyngeal swab, presence of viral mutation(s) within the areas targeted by this assay, and inadequate number of viral copies(<138 copies/mL). A negative result must be combined with clinical observations, patient history, and epidemiological information. The expected result is Negative.  Fact Sheet for Patients:  BloggerCourse.com  Fact Sheet for Healthcare Providers:  SeriousBroker.it  This test is no t yet approved or cleared by the [place]  FDA and  has been authorized for detection and/or diagnosis of SARS-CoV-2 by FDA under an Emergency Use Authorization (EUA). This EUA will remain  in effect (meaning this test can be used) for the duration of the COVID-19 declaration under Section 564(b)(1) of the Act, 21 U.S.C.section 360bbb-3(b)(1), unless the authorization is terminated  or  revoked sooner.       Influenza A by PCR NEGATIVE NEGATIVE Final   Influenza B by PCR NEGATIVE NEGATIVE Final    Comment: (NOTE) The Xpert Xpress SARS-CoV-2/FLU/RSV plus assay is intended as an aid in the diagnosis of influenza from Nasopharyngeal swab specimens and should not be used as a sole basis for treatment. Nasal washings and aspirates are unacceptable for Xpert Xpress SARS-CoV-2/FLU/RSV testing.  Fact Sheet for Patients: BloggerCourse.com  Fact Sheet for Healthcare Providers: SeriousBroker.it  This test is not yet approved or cleared by the [place]  FDA and has been authorized for detection and/or diagnosis of SARS-CoV-2 by FDA under an Emergency Use Authorization (EUA). This EUA will remain in effect (meaning this test can be used) for the duration of the COVID-19 declaration under Section 564(b)(1) of the Act, 21 U.S.C. section 360bbb-3(b)(1), unless the authorization is terminated or revoked.     Resp Syncytial Virus by PCR NEGATIVE NEGATIVE Final    Comment: (NOTE) Fact Sheet for Patients: BloggerCourse.com  Fact Sheet for Healthcare Providers: SeriousBroker.it  This test is not yet approved or cleared by the [place]  FDA and has been authorized for detection and/or diagnosis of SARS-CoV-2 by FDA under an Emergency Use Authorization (EUA). This EUA will remain in effect (meaning this test can be used) for the duration of the COVID-19 declaration under Section 564(b)(1) of the Act, 21 U.S.C. section 360bbb-3(b)(1), unless the authorization is terminated or revoked.  Performed at [facility], [address] [place], [place] [address]   Blood culture (routine x 2)     Status: None (Preliminary result)   Collection Time: 10/11/24  3:25 PM   Specimen: BLOOD RIGHT ARM  Result Value Ref Range Status   Specimen Description BLOOD RIGHT ARM  Final    Special Requests   Final    BOTTLES DRAWN AEROBIC AND ANAEROBIC Blood Culture adequate volume   Culture   Final    NO GROWTH 2 DAYS Performed at [facility], [address] [place], [place] [address]    Report Status PENDING  Incomplete  Blood culture (routine x 2)     Status: None (Preliminary result)  Collection Time: 10/11/24  3:25 PM   Specimen: BLOOD LEFT ARM  Result Value Ref Range Status   Specimen Description BLOOD LEFT ARM  Final   Special Requests   Final    BOTTLES DRAWN AEROBIC AND ANAEROBIC Blood Culture adequate volume   Culture   Final    NO GROWTH 2 DAYS Performed at [facility], [address] [place], [place] [address]    Report Status PENDING  Incomplete    Radiology Studies: CT HEAD WO CONTRAST ( ) Result Date: 10/12/2024 EXAM: CT HEAD WITHOUT CONTRAST 10/12/2024 05:57:18 PM TECHNIQUE: CT of the head was performed without the administration of intravenous contrast. Automated exposure control, iterative reconstruction, and/or weight based adjustment of the mA/kV was utilized to reduce the radiation dose to as low as reasonably achievable. COMPARISON: Comparison made with prior CT from 07/04/2024 as well as earlier studies. CLINICAL HISTORY: Mental status change, unknown cause. FINDINGS: BRAIN AND VENTRICLES: Patchy hyperdensity involving the supratentorial cerebral white matter consistent with chronic 2-vessel ischemic disease, stable. Few small remote cerebellar infarcts, right greater than left, also stable. No acute intracranial hemorrhage. No visible acute large vessel territory infarct. No hydrocephalus. No extra-axial collection. No mass effect or midline shift. 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 No abnormal hyperdense vessel. Calcified atherosclerosis present about the skull base. ORBITS: No acute abnormality. SINUSES: No acute abnormality. SOFT TISSUES AND SKULL: No  acute soft tissue abnormality. No skull fracture. IMPRESSION: 1. No acute intracranial abnormality. 2. 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 3. Chronic microvascular ischemic disease with a few small remote cerebellar infarcts, stable. Electronically signed by: Morene Hoard MD 10/12/2024 06:33 PM EDT RP Workstation: [id number]   Scheduled Meds:  aspirin  EC  81 mg Oral Daily   atorvastatin   40 mg Oral Daily   enoxaparin  (LOVENOX ) injection  40 mg Subcutaneous Q24H   fluticasone  furoate-vilanterol  1 puff Inhalation Daily   guaiFENesin   600 mg Oral BID   insulin  aspart  0-20 Units Subcutaneous TID WC   insulin  aspart  0-5 Units Subcutaneous QHS   insulin  glargine-yfgn  12 Units Subcutaneous QHS   ipratropium-albuterol   3 mL Nebulization Q6H   levothyroxine   50 mcg Oral Q0600   methylPREDNISolone  (SOLU-MEDROL ) injection  40 mg Intravenous Q12H   pantoprazole   40 mg Oral Daily   pregabalin   75 mg Oral BID   umeclidinium bromide   1 puff Inhalation Daily   venlafaxine  XR  150 mg Oral Q breakfast   vortioxetine  HBr  10 mg Oral Daily   Continuous Infusions:  sodium chloride  143 mL/hr at 10/13/24 1322   azithromycin  500 mg (10/13/24 1637)   cefTRIAXone  (ROCEPHIN )  IV 2 g (10/13/24 1557)    LOS: 2 days   Rendall Carwin M.D on 10/13/2024 at 4:50 PM  Go to [email or web address] - for contact info  [facility] - Office  [phone]  If 7PM-7AM, please contact night-coverage [email or web address] 10/13/2024, 4:50 PM

## 2024-10-13 NOTE — Plan of Care (Signed)
  Problem: Education: Goal: Knowledge of General Education information will improve Description: Including pain rating scale, medication(s)/side effects and non-pharmacologic comfort measures Outcome: Progressing   Problem: Health Behavior/Discharge Planning: Goal: Ability to manage health-related needs will improve Outcome: Progressing   Problem: Clinical Measurements: Goal: Ability to maintain clinical measurements within normal limits will improve Outcome: Progressing Goal: Will remain free from infection Outcome: Progressing Goal: Diagnostic test results will improve Outcome: Progressing Goal: Respiratory complications will improve Outcome: Progressing Goal: Cardiovascular complication will be avoided Outcome: Progressing   Problem: Activity: Goal: Risk for activity intolerance will decrease Outcome: Progressing   Problem: Nutrition: Goal: Adequate nutrition will be maintained Outcome: Progressing   Problem: Coping: Goal: Level of anxiety will decrease Outcome: Progressing   Problem: Elimination: Goal: Will not experience complications related to bowel motility Outcome: Progressing Goal: Will not experience complications related to urinary retention Outcome: Progressing   Problem: Pain Managment: Goal: General experience of comfort will improve and/or be controlled Outcome: Progressing   Problem: Safety: Goal: Ability to remain free from injury will improve Outcome: Progressing   Problem: Skin Integrity: Goal: Risk for impaired skin integrity will decrease Outcome: Progressing   Problem: Education: Goal: Knowledge of disease or condition will improve Outcome: Progressing Goal: Knowledge of the prescribed therapeutic regimen will improve Outcome: Progressing Goal: Individualized Educational Video(s) Outcome: Progressing   Problem: Activity: Goal: Ability to tolerate increased activity will improve Outcome: Progressing Goal: Will verbalize the  importance of balancing activity with adequate rest periods Outcome: Progressing   Problem: Respiratory: Goal: Ability to maintain a clear airway will improve Outcome: Progressing Goal: Levels of oxygenation will improve Outcome: Progressing Goal: Ability to maintain adequate ventilation will improve Outcome: Progressing   Problem: Activity: Goal: Ability to tolerate increased activity will improve Outcome: Progressing   Problem: Clinical Measurements: Goal: Ability to maintain a body temperature in the normal range will improve Outcome: Progressing   Problem: Respiratory: Goal: Ability to maintain adequate ventilation will improve Outcome: Progressing Goal: Ability to maintain a clear airway will improve Outcome: Progressing   Problem: Education: Goal: Ability to describe self-care measures that may prevent or decrease complications (Diabetes Survival Skills Education) will improve Outcome: Progressing Goal: Individualized Educational Video(s) Outcome: Progressing   Problem: Coping: Goal: Ability to adjust to condition or change in health will improve Outcome: Progressing   Problem: Fluid Volume: Goal: Ability to maintain a balanced intake and output will improve Outcome: Progressing   Problem: Health Behavior/Discharge Planning: Goal: Ability to identify and utilize available resources and services will improve Outcome: Progressing Goal: Ability to manage health-related needs will improve Outcome: Progressing   Problem: Metabolic: Goal: Ability to maintain appropriate glucose levels will improve Outcome: Progressing   Problem: Nutritional: Goal: Maintenance of adequate nutrition will improve Outcome: Progressing Goal: Progress toward achieving an optimal weight will improve Outcome: Progressing   Problem: Skin Integrity: Goal: Risk for impaired skin integrity will decrease Outcome: Progressing   Problem: Tissue Perfusion: Goal: Adequacy of tissue  perfusion will improve Outcome: Progressing

## 2024-10-14 ENCOUNTER — Other Ambulatory Visit: Payer: Self-pay | Admitting: Obstetrics and Gynecology

## 2024-10-14 DIAGNOSIS — J189 Pneumonia, unspecified organism: Secondary | ICD-10-CM | POA: Diagnosis not present

## 2024-10-14 LAB — GLUCOSE, CAPILLARY
Glucose-Capillary: 130 mg/dL — ABNORMAL HIGH (ref 70–99)
Glucose-Capillary: 139 mg/dL — ABNORMAL HIGH (ref 70–99)
Glucose-Capillary: 163 mg/dL — ABNORMAL HIGH (ref 70–99)
Glucose-Capillary: 139 mg/dL — ABNORMAL HIGH (ref 70–99)

## 2024-10-14 MED ORDER — ATORVASTATIN CALCIUM 40 MG PO TABS
80.0000 mg | ORAL_TABLET | Freq: Every day | ORAL | Status: DC
  Administered 2024-10-15 – 2024-10-16 (×2): 80 mg via ORAL
  Filled 2024-10-14 (×2): qty 2

## 2024-10-14 MED ORDER — ENSURE PLUS HIGH PROTEIN PO LIQD
237.0000 mL | Freq: Two times a day (BID) | ORAL | Status: DC
  Administered 2024-10-15 – 2024-10-16 (×3): 237 mL via ORAL

## 2024-10-14 MED ORDER — IMVEXXY MAINTENANCE PACK 10 MCG VA INST: 1.0000 | VAGINAL_INSERT | VAGINAL | 6 refills | Status: DC

## 2024-10-14 MED ORDER — BUTALBITAL-APAP-CAFFEINE 50-325-40 MG PO TABS
2.0000 | ORAL_TABLET | Freq: Once | ORAL | Status: AC
  Administered 2024-10-14: 2 via ORAL
  Filled 2024-10-14: qty 2

## 2024-10-14 NOTE — Evaluation (Signed)
 Occupational Therapy Evaluation Patient Details Name: [name] White MRN: [id number] DOB: 12-Apr-1941 Today's Date: 10/14/2024   History of Present Illness   [name] White is a 83 y.o. female with medical history significant of diabetes, hypertension, hypothyroidism, dementia, malnutrition, history of CVA, asthma.  Patient is a resident of [facility] and has been having coughing, congestion, fatigue since yesterday.  Due to her symptoms, she was brought here for evaluation.  Chest x-ray here shows pneumonia.  No white count, but is is borderline hypoxic.  She did have something similar back in May and early June.  She was admitted with pneumonia and COPD exacerbation and discharged on steroids and in a long-acting corticosteroid inhalers.  She did see pulmonology who is not convinced of her COPD diagnosis.  Her Breo was stopped, but she continued on the short acting inhalers as needed. (per DO)     Clinical Impressions Pt agreeable to OT evaluation, but initially lethargic. No family or ALF staff present and pt was only oriented to herself today. Pt required mod to max A for bed mobility and mod A for EOB to chair transfer with RW. Pt reports assist for dressing, bathing, and toileting at baseline. Per observation and clinical judgement pt requires CGA for seated upper body ADL's and max to total for LB ADL's. B UE generally weak with mild A/ROM deficits in the shoulders. Pt left in the chair with call bell within reach and chair alarm set. Pt will benefit from continued OT in the hospital to increase strength, balance, and endurance for safe ADL's.        If plan is discharge home, recommend the following:   A lot of help with walking and/or transfers;A lot of help with bathing/dressing/bathroom;Assistance with cooking/housework;Assist for transportation;Help with stairs or ramp for entrance;Direct supervision/assist for medications management     Functional Status Assessment   Patient has  had a recent decline in their functional status and demonstrates the ability to make significant improvements in function in a reasonable and predictable amount of time.     Equipment Recommendations   None recommended by OT      Precautions/Restrictions   Precautions Precautions: Fall Recall of Precautions/Restrictions: Impaired Restrictions Weight Bearing Restrictions Per Provider Order: No     Mobility Bed Mobility Overal bed mobility: Needs Assistance Bed Mobility: Supine to Sit     Supine to sit: Mod assist, Max assist     General bed mobility comments: labored movement; much assist for trunk control and to move B LE to EOB.    Transfers Overall transfer level: Needs assistance Equipment used: Rolling walker (2 wheels) Transfers: Sit to/from Stand, Bed to chair/wheelchair/BSC Sit to Stand: Mod assist     Step pivot transfers: Mod assist     General transfer comment: labored movement; extended time and cuing for steps to chair with RW      Balance Overall balance assessment: Needs assistance Sitting-balance support: No upper extremity supported, Feet supported Sitting balance-Leahy Scale: Fair Sitting balance - Comments: poor to fair seated at EOB; posterior lean at times Postural control: Posterior lean Standing balance support: Bilateral upper extremity supported, During functional activity, Reliant on assistive device for balance Standing balance-Leahy Scale: Poor Standing balance comment: using RW                           ADL either performed or assessed with clinical judgement   ADL Overall ADL's : Needs assistance/impaired  Grooming: Set up;Sitting;Contact guard assist   Upper Body Bathing: Set up;Sitting;Contact guard assist   Lower Body Bathing: Total assistance;Maximal assistance;Sitting/lateral leans   Upper Body Dressing : Set up;Sitting;Contact guard assist   Lower Body Dressing: Maximal assistance;Total  assistance;Sitting/lateral leans Lower Body Dressing Details (indicate cue type and reason): Pt reports assist at baseline. Toilet Transfer: Moderate assistance;Stand-pivot;Rolling walker (2 wheels) Toilet Transfer Details (indicate cue type and reason): EOB to chair with RW Toileting- Clothing Manipulation and Hygiene: Maximal assistance;Sitting/lateral lean               Vision Patient Visual Report:  (Unsure) Vision Assessment?: No apparent visual deficits (Will continue to assess.)     Perception Perception: Not tested       Praxis Praxis: Not tested       Pertinent Vitals/Pain Pain Assessment Pain Assessment: Faces Faces Pain Scale: No hurt     Extremity/Trunk Assessment Upper Extremity Assessment Upper Extremity Assessment: Generalized weakness (3-/5 shoulder flexion bilaterally. Mild limited A/ROM for shoulder flexion; 4/5 grossly other than shoulder flexion.)   Lower Extremity Assessment Lower Extremity Assessment: Defer to PT evaluation   Cervical / Trunk Assessment Cervical / Trunk Assessment: Kyphotic   Communication Communication Communication: No apparent difficulties   Cognition Arousal: Lethargic Behavior During Therapy: WFL for tasks assessed/performed Cognition: No family/caregiver present to determine baseline, Cognition impaired   Orientation impairments: Time, Situation, Place         OT - Cognition Comments: Pt only oriented to self today; lethargic. No family present.                 Following commands: Intact       Cueing  General Comments   Cueing Techniques: Verbal cues;Tactile cues;Visual cues                 Home Living Family/patient expects to be discharged to:: Assisted living                             Home Equipment: Rolling Walker (2 wheels);Rollator (4 wheels);Cane - quad;Cane - single point;Hand held shower head;Shower seat - built in;Grab bars - tub/shower   Additional Comments: per  chart      Prior Functioning/Environment Prior Level of Function : Patient poor historian/Family not available;Needs assist       Physical Assist : Mobility (physical);ADLs (physical) Mobility (physical): Bed mobility;Transfers;Gait ADLs (physical): Bathing;Dressing;Toileting;IADLs Mobility Comments: Asissted short household distances using RW, uses w/c mostly (per PT note and pt confirmation) ADLs Comments: Pt reports she can groom and feed without assist; ALF staff assist with bathing, dressing, toileting, and IADL's.    OT Problem List: Decreased strength;Decreased range of motion;Decreased activity tolerance;Impaired balance (sitting and/or standing);Decreased cognition;Decreased safety awareness;Decreased knowledge of use of DME or AE   OT Treatment/Interventions: Self-care/ADL training;Therapeutic exercise;Therapeutic activities;Patient/family education;Balance training;DME and/or AE instruction;Cognitive remediation/compensation      OT Goals(Current goals can be found in the care plan section)   Acute Rehab OT Goals Patient Stated Goal: improve function OT Goal Formulation: With patient Time For Goal Achievement: 10/28/24 Potential to Achieve Goals: Good   OT Frequency:  Min 3X/week                                   End of Session Equipment Utilized During Treatment: Rolling walker (2 wheels);Gait belt Nurse Communication: Mobility status  Activity Tolerance:  Patient tolerated treatment well Patient left: in chair;with call bell/phone within reach;with chair alarm set  OT Visit Diagnosis: Unsteadiness on feet (R26.81);Other abnormalities of gait and mobility (R26.89);Muscle weakness (generalized) (M62.81);Other symptoms and signs involving cognitive function                Time: 8845-8790 OT Time Calculation (min): 15 min Charges:  OT General Charges $OT Visit: 1 Visit OT Evaluation $OT Eval Low Complexity: 1 Low  Stephanny Tsutsui OT,  MOT  Jayson Person 10/14/2024, 4:28 PM

## 2024-10-14 NOTE — Plan of Care (Signed)
  Problem: Education: Goal: Knowledge of General Education information will improve Description: Including pain rating scale, medication(s)/side effects and non-pharmacologic comfort measures Outcome: Progressing   Problem: Health Behavior/Discharge Planning: Goal: Ability to manage health-related needs will improve Outcome: Progressing   Problem: Clinical Measurements: Goal: Ability to maintain clinical measurements within normal limits will improve Outcome: Progressing Goal: Will remain free from infection Outcome: Progressing Goal: Diagnostic test results will improve Outcome: Progressing Goal: Respiratory complications will improve Outcome: Progressing Goal: Cardiovascular complication will be avoided Outcome: Progressing   Problem: Activity: Goal: Risk for activity intolerance will decrease Outcome: Progressing   Problem: Nutrition: Goal: Adequate nutrition will be maintained Outcome: Progressing   Problem: Coping: Goal: Level of anxiety will decrease Outcome: Progressing   Problem: Elimination: Goal: Will not experience complications related to bowel motility Outcome: Progressing Goal: Will not experience complications related to urinary retention Outcome: Progressing   Problem: Pain Managment: Goal: General experience of comfort will improve and/or be controlled Outcome: Progressing   Problem: Safety: Goal: Ability to remain free from injury will improve Outcome: Progressing   Problem: Skin Integrity: Goal: Risk for impaired skin integrity will decrease Outcome: Progressing   Problem: Education: Goal: Knowledge of disease or condition will improve Outcome: Progressing Goal: Knowledge of the prescribed therapeutic regimen will improve Outcome: Progressing Goal: Individualized Educational Video(s) Outcome: Progressing   Problem: Activity: Goal: Ability to tolerate increased activity will improve Outcome: Progressing Goal: Will verbalize the  importance of balancing activity with adequate rest periods Outcome: Progressing   Problem: Respiratory: Goal: Ability to maintain a clear airway will improve Outcome: Progressing Goal: Levels of oxygenation will improve Outcome: Progressing Goal: Ability to maintain adequate ventilation will improve Outcome: Progressing   Problem: Activity: Goal: Ability to tolerate increased activity will improve Outcome: Progressing   Problem: Clinical Measurements: Goal: Ability to maintain a body temperature in the normal range will improve Outcome: Progressing   Problem: Respiratory: Goal: Ability to maintain adequate ventilation will improve Outcome: Progressing Goal: Ability to maintain a clear airway will improve Outcome: Progressing   Problem: Education: Goal: Ability to describe self-care measures that may prevent or decrease complications (Diabetes Survival Skills Education) will improve Outcome: Progressing Goal: Individualized Educational Video(s) Outcome: Progressing   Problem: Coping: Goal: Ability to adjust to condition or change in health will improve Outcome: Progressing   Problem: Fluid Volume: Goal: Ability to maintain a balanced intake and output will improve Outcome: Progressing   Problem: Health Behavior/Discharge Planning: Goal: Ability to identify and utilize available resources and services will improve Outcome: Progressing Goal: Ability to manage health-related needs will improve Outcome: Progressing   Problem: Metabolic: Goal: Ability to maintain appropriate glucose levels will improve Outcome: Progressing   Problem: Nutritional: Goal: Maintenance of adequate nutrition will improve Outcome: Progressing Goal: Progress toward achieving an optimal weight will improve Outcome: Progressing   Problem: Skin Integrity: Goal: Risk for impaired skin integrity will decrease Outcome: Progressing   Problem: Tissue Perfusion: Goal: Adequacy of tissue  perfusion will improve Outcome: Progressing

## 2024-10-14 NOTE — Progress Notes (Signed)
 PROGRESS NOTE  [name] White FMW:[id number] DOB: 1941-09-17 DOA: 10/11/2024 PCP: [name], MD   LOS: 3 days   Brief Narrative / Interim history: 83 year old female with DM2, HTN, hypothyroidism, dementia, prior CVA admitted to the hospital 10/17 with community-acquired pneumonia, hypoxemia and altered mentation  Subjective / 24h Interval events: Denies any specific complaints for me, however she appears confused.  She is alert to place but not time nor situation  Assesement and Plan: Principal problem Community-acquired pneumonia -patient with persistent coughing and wheezing this morning.  Asking for more cough medications as this is persistently bothering her - Has been placed on ceftriaxone  and azithromycin , continue - Continue steroids, DuoNebs  Active problems Acute hypoxemic respiratory failure -patient found to be hypoxic on admission documented into the upper 70s requiring 2 L supplemental oxygen.  She was also tachypneic with increased respiratory effort, tachycardic on presentation in the ED - Continue supplemental oxygen, wean off to room air as tolerated  Acute metabolic encephalopathy, underlying reported dementia -CT head without acute findings, continue antibiotics and treating her pneumonia and hypoxemia.  Her Lyrica  was resumed yesterday as the daughter felt like stopping Lyrica  will affect the patient's mental status  Acute COPD exacerbation - due to 1 above, IV steroids and antibiotics and bronchodilators as above #1   GERD - continue Protonix   Hyperlipidemia-continue statin  Depression-continue Effexor , Trintellix   DM2, well-controlled, with hyperglycemia- metformin  on hold, continue Lantus , sliding scale  Lab Results  Component Value Date   HGBA1C 9.8 (H) 07/02/2024   CBG (last 3)  Recent Labs    10/13/24 1613 10/13/24 2004 10/14/24 0722  GLUCAP 252* 110* 130*   Scheduled Meds:  aspirin  EC  81 mg Oral Daily   [START ON 10/15/2024] atorvastatin    80 mg Oral Daily   enoxaparin  (LOVENOX ) injection  40 mg Subcutaneous Q24H   fluticasone  furoate-vilanterol  1 puff Inhalation Daily   guaiFENesin   600 mg Oral BID   hydrocortisone  sod succinate (SOLU-CORTEF ) inj  50 mg Intravenous Q8H   insulin  aspart  0-20 Units Subcutaneous TID WC   insulin  aspart  0-5 Units Subcutaneous QHS   insulin  glargine-yfgn  12 Units Subcutaneous QHS   ipratropium-albuterol   3 mL Nebulization TID   levothyroxine   50 mcg Oral Q0600   pantoprazole   40 mg Oral Daily   pregabalin   75 mg Oral BID   umeclidinium bromide   1 puff Inhalation Daily   venlafaxine  XR  150 mg Oral Q breakfast   vortioxetine  HBr  10 mg Oral Daily   Continuous Infusions:  azithromycin  Stopped (10/13/24 1658)   cefTRIAXone  (ROCEPHIN )  IV 2 g (10/13/24 1557)   PRN Meds:.acetaminophen  **OR** acetaminophen , albuterol , ondansetron  **OR** ondansetron  (ZOFRAN ) IV, polyethylene glycol, traZODone   Current Outpatient Medications  Medication Instructions   acetaminophen  (TYLENOL ) 650 mg, Oral, Every 6 hours PRN   albuterol  (VENTOLIN  HFA) 108 (90 Base) MCG/ACT inhaler 2 puffs, Inhalation, Every 4 hours PRN,     aspirin  EC 81 mg, Daily   atorvastatin  (LIPITOR) 40 MG tablet TAKE (2) TABLETS (80 MG) BY MOUTH DAILY.   cyanocobalamin  (VITAMIN B12) 1,000 mcg, Every 30 days   cycloSPORINE  (RESTASIS ) 0.05 % ophthalmic emulsion 1 drop, 2 times daily   dimethicone 1 % cream 1 Application, 2 times daily PRN   estradiol  (ESTRACE ) 1 g, Every other day   Estradiol  (IMVEXXY  MAINTENANCE PACK) 10 MCG INST 1 Insert, Vaginal, 2 times weekly, Please use three times a week PV for two weeks for severe atrophy  then two times a week thereafter   furosemide  (LASIX ) 20 mg, Oral, Daily   ipratropium-albuterol  (DUONEB) 0.5-2.5 (3) MG/3ML SOLN INHALE ONE VIAL ( ) VIA NEBULIZATION EVERY FOUR HOURS AS NEEDED FOR WHEEZING / SHORTNESS OF BREATH.   levothyroxine  (SYNTHROID ) 50 mcg, Daily   metFORMIN  (GLUCOPHAGE ) 1,000 mg, 2  times daily   midodrine  (PROAMATINE ) 5 mg, 3 times daily with meals   Multiple Vitamins-Minerals (THERA-M) TABS 1 tablet, Daily   nitrofurantoin  (MACRODANTIN ) 100 mg, Oral, Daily   NONFORMULARY OR COMPOUNDED ITEM If patient is incontinent of urine and/or stool, SNF staff are instructed to cleanse patient's genital area at least once every 8 hours and change diaper if soiled.   NONFORMULARY OR COMPOUNDED ITEM SNF staff are instructed to encourage hydration with water  at least once every 2-4 hours while patient is awake.   NONFORMULARY OR COMPOUNDED ITEM SNF staff are instructed to prompt toileting and assist patient with ambulation to toilet at least once every 4-6 hours while awake.   omeprazole (PRILOSEC) 20 mg, BH-each morning   polyethylene glycol (MIRALAX  / GLYCOLAX ) 17 g, Oral, Daily PRN   potassium chloride  (KLOR-CON ) 10 MEQ tablet 10 mEq, Daily   pregabalin  (LYRICA ) 150 mg, Oral, 2 times daily   rizatriptan  (MAXALT -MLT) 10 mg, As needed   tiZANidine (ZANAFLEX) 2 mg, 2 times daily PRN   traZODone  (DESYREL ) 50 mg, Oral, At bedtime PRN   Trintellix  10 mg, Daily   venlafaxine  XR (EFFEXOR -XR) 150 mg, BH-each morning    Diet Orders (From admission, onward)     Start     Ordered   10/11/24 1615  Diet heart healthy/carb modified Room service appropriate? Yes; Fluid consistency: Thin  Diet effective now       Question Answer Comment  Diet-HS Snack? Nothing   Room service appropriate? Yes   Fluid consistency: Thin      10/11/24 1616            DVT prophylaxis: enoxaparin  (LOVENOX ) injection 40 mg Start: 10/11/24 2200   Lab Results  Component Value Date   PLT 257 10/12/2024      Code Status: Limited: Do not attempt resuscitation (DNR) -DNR-LIMITED -Do Not Intubate/DNI   Family Communication: No family at bedside  Status is: Inpatient Remains inpatient appropriate because: Severity of illness  Level of care: Med-Surg  Consultants:  None   Objective: Vitals:    10/13/24 2258 10/14/24 0348 10/14/24 0745 10/14/24 1056  BP: 98/67 (!) 76/56  90/62  Pulse: (!) 107 96  100  Resp: 18 20    Temp: 97.7 F (36.5 C) 97.8 F (36.6 C)  (!) 97.5 F (36.4 C)  TempSrc: Oral Oral  Oral  SpO2: 98% 98% 98% 94%  Weight:      Height:        Intake/Output Summary (Last 24 hours) at 10/14/2024 1059 Last data filed at 10/14/2024 0536 Gross per 24 hour  Intake 1175.79 ml  Output 450 ml  Net 725.79 ml   Wt Readings from Last 3 Encounters:  10/11/24 59 kg  08/16/24 59.8 kg  07/16/24 59 kg    Examination:  Constitutional: NAD Eyes: no scleral icterus ENMT: Mucous membranes are moist.  Neck: normal, supple Respiratory: Bilateral rhonchi, faint end expiratory wheezing.  Productive cough and wheezing appears to clear with coughing Cardiovascular: Regular rate and rhythm, no murmurs / rubs / gallops. No LE edema.  Abdomen: non distended, no tenderness. Bowel sounds positive.  Musculoskeletal: no clubbing / cyanosis.  Data Reviewed: I have independently reviewed following labs and imaging studies   CBC Recent Labs  Lab 10/11/24 1121 10/12/24 0435  WBC 10.0 7.2  HGB 12.2 12.3  HCT 38.1 37.8  PLT 236 257  MCV 89.6 86.7  MCH 28.7 28.2  MCHC 32.0 32.5  RDW 16.2* 16.1*  LYMPHSABS 2.0  --   MONOABS 0.8  --   EOSABS 0.5  --   BASOSABS 0.1  --     Recent Labs  Lab 10/11/24 1121 10/12/24 0435  NA 134* 138  K 4.3 4.3  CL 100 103  CO2 21* 26  GLUCOSE 119* 210*  BUN 8 8  CREATININE 0.55 0.52  CALCIUM  9.1 9.3  AST 33  --   ALT 13  --   ALKPHOS 40  --   BILITOT 0.8  --   ALBUMIN 3.6  --     ------------------------------------------------------------------------------------------------------------------ No results for input(s): CHOL, HDL, LDLCALC, TRIG, CHOLHDL, LDLDIRECT in the last 72 hours.  Lab Results  Component Value Date   HGBA1C 9.8 (H) 07/02/2024    ------------------------------------------------------------------------------------------------------------------ No results for input(s): TSH, T4TOTAL, T3FREE, THYROIDAB in the last 72 hours.  Invalid input(s): FREET3  Cardiac Enzymes No results for input(s): CKMB, TROPONINI, MYOGLOBIN in the last 168 hours.  Invalid input(s): CK ------------------------------------------------------------------------------------------------------------------    Component Value Date/Time   BNP 239.0 (H) 05/22/2024 1134    CBG: Recent Labs  Lab 10/13/24 0727 10/13/24 1106 10/13/24 1613 10/13/24 2004 10/14/24 0722  GLUCAP 125* 228* 252* 110* 130*    Recent Results (from the past 240 hours)  Resp panel by RT-PCR (RSV, Flu A&B, Covid) Anterior Nasal Swab     Status: None   Collection Time: 10/11/24 10:48 AM   Specimen: Anterior Nasal Swab  Result Value Ref Range Status   SARS Coronavirus 2 by RT PCR NEGATIVE NEGATIVE Final    Comment: (NOTE) SARS-CoV-2 target nucleic acids are NOT DETECTED.  The SARS-CoV-2 RNA is generally detectable in upper respiratory specimens during the acute phase of infection. The lowest concentration of SARS-CoV-2 viral copies this assay can detect is 138 copies/mL. A negative result does not preclude SARS-Cov-2 infection and should not be used as the sole basis for treatment or other patient management decisions. A negative result may occur with  improper specimen collection/handling, submission of specimen other than nasopharyngeal swab, presence of viral mutation(s) within the areas targeted by this assay, and inadequate number of viral copies(<138 copies/mL). A negative result must be combined with clinical observations, patient history, and epidemiological information. The expected result is Negative.  Fact Sheet for Patients:  BloggerCourse.com  Fact Sheet for Healthcare Providers:   SeriousBroker.it  This test is no t yet approved or cleared by the [place]  FDA and  has been authorized for detection and/or diagnosis of SARS-CoV-2 by FDA under an Emergency Use Authorization (EUA). This EUA will remain  in effect (meaning this test can be used) for the duration of the COVID-19 declaration under Section 564(b)(1) of the Act, 21 U.S.C.section 360bbb-3(b)(1), unless the authorization is terminated  or revoked sooner.       Influenza A by PCR NEGATIVE NEGATIVE Final   Influenza B by PCR NEGATIVE NEGATIVE Final    Comment: (NOTE) The Xpert Xpress SARS-CoV-2/FLU/RSV plus assay is intended as an aid in the diagnosis of influenza from Nasopharyngeal swab specimens and should not be used as a sole basis for treatment. Nasal washings and aspirates are unacceptable for Xpert Xpress SARS-CoV-2/FLU/RSV testing.  Fact  Sheet for Patients: BloggerCourse.com  Fact Sheet for Healthcare Providers: SeriousBroker.it  This test is not yet approved or cleared by the [place]  FDA and has been authorized for detection and/or diagnosis of SARS-CoV-2 by FDA under an Emergency Use Authorization (EUA). This EUA will remain in effect (meaning this test can be used) for the duration of the COVID-19 declaration under Section 564(b)(1) of the Act, 21 U.S.C. section 360bbb-3(b)(1), unless the authorization is terminated or revoked.     Resp Syncytial Virus by PCR NEGATIVE NEGATIVE Final    Comment: (NOTE) Fact Sheet for Patients: BloggerCourse.com  Fact Sheet for Healthcare Providers: SeriousBroker.it  This test is not yet approved or cleared by the [place]  FDA and has been authorized for detection and/or diagnosis of SARS-CoV-2 by FDA under an Emergency Use Authorization (EUA). This EUA will remain in effect (meaning this test can be used) for  the duration of the COVID-19 declaration under Section 564(b)(1) of the Act, 21 U.S.C. section 360bbb-3(b)(1), unless the authorization is terminated or revoked.  Performed at [facility], [address], [place], [place] [address]   Blood culture (routine x 2)     Status: None (Preliminary result)   Collection Time: 10/11/24  3:25 PM   Specimen: BLOOD RIGHT ARM  Result Value Ref Range Status   Specimen Description BLOOD RIGHT ARM  Final   Special Requests   Final    BOTTLES DRAWN AEROBIC AND ANAEROBIC Blood Culture adequate volume   Culture   Final    NO GROWTH 3 DAYS Performed at [facility], [address] [place], [place] [address]    Report Status PENDING  Incomplete  Blood culture (routine x 2)     Status: None (Preliminary result)   Collection Time: 10/11/24  3:25 PM   Specimen: BLOOD LEFT ARM  Result Value Ref Range Status   Specimen Description BLOOD LEFT ARM  Final   Special Requests   Final    BOTTLES DRAWN AEROBIC AND ANAEROBIC Blood Culture adequate volume   Culture   Final    NO GROWTH 3 DAYS Performed at [facility], [address], [place], [place] [address]    Report Status PENDING  Incomplete     Radiology Studies: No results found.   Nilda Fendt, MD, PhD [facility]  Between 7 am - 7 pm I am available, please contact me via Amion (for emergencies) or Securechat (non urgent messages)  Between 7 pm - 7 am I am not available, please contact night coverage MD/APP via Amion

## 2024-10-14 NOTE — Care Management Important Message (Signed)
 Important Message  Patient Details  Name: [name] White MRN: [id number] Date of Birth: Jan 31, 1941   Important Message Given:  N/A - LOS <3 / Initial given by admissions     [name] 10/14/2024, 12:17 PM

## 2024-10-14 NOTE — TOC Progression Note (Signed)
 Transition of Care [facility]) - Progression Note    Patient Details  Name: [name] White MRN: [id number] Date of Birth: Dec 17, 1941  [facility] CM/SW Contact  Lucie Lunger, [place] Phone Number: 10/14/2024, 2:06 PM  Clinical Narrative:    CSW spoke to [place] with [facility] who states she will speak with Tammy and plan to assess pt possibly tomorrow around lunch, she will update this CSW. Kyra states pt has assistance with all ADLs and is mostly in her wheelchair at the ALF. TOC to follow.   Expected Discharge Plan: Assisted Living Barriers to Discharge: Continued Medical Work up               Expected Discharge Plan and Services In-house Referral: Clinical Social Work Discharge Planning Services: CM Consult Post Acute Care Choice: Home Health Living arrangements for the past 2 months: Assisted Living Facility                                       Social Drivers of Health (SDOH) Interventions SDOH Screenings   Food Insecurity: No Food Insecurity (10/11/2024)  Housing: Low Risk  (10/11/2024)  Transportation Needs: No Transportation Needs (10/11/2024)  Utilities: Not At Risk (10/11/2024)  Depression (PHQ2-9): Low Risk  (01/28/2020)  Social Connections: Socially Isolated (10/11/2024)  Tobacco Use: Low Risk  (10/11/2024)    Readmission Risk Interventions    10/12/2024   10:32 AM 05/23/2024   10:18 AM 04/25/2024    1:46 PM  Readmission Risk Prevention Plan  Transportation Screening Complete Complete Complete  PCP or Specialist Appt within 3-5 Days   Not Complete  HRI or Home Care Consult Complete Complete Complete  Social Work Consult for Recovery Care Planning/Counseling Complete Complete Complete  Palliative Care Screening Not Applicable Not Applicable Not Complete  Medication Review Oceanographer) Complete Complete Complete

## 2024-10-14 NOTE — Plan of Care (Signed)
  Problem: Acute Rehab OT Goals (only OT should resolve) Goal: Pt. Will Perform Grooming Flowsheets (Taken 10/14/2024 1631) Pt Will Perform Grooming:  with set-up  sitting Goal: Pt. Will Perform Upper Body Dressing Flowsheets (Taken 10/14/2024 1631) Pt Will Perform Upper Body Dressing:  with supervision  sitting Goal: Pt. Will Perform Lower Body Dressing Flowsheets (Taken 10/14/2024 1631) Pt Will Perform Lower Body Dressing:  with min assist  with mod assist  with adaptive equipment  sitting/lateral leans Goal: Pt. Will Transfer To Toilet Flowsheets (Taken 10/14/2024 1631) Pt Will Transfer to Toilet:  with contact guard assist  stand pivot transfer Goal: Pt/Caregiver Will Perform Home Exercise Program Flowsheets (Taken 10/14/2024 1631) Pt/caregiver will Perform Home Exercise Program:  Increased strength  Increased ROM  Both right and left upper extremity  With minimal assist  Milania Haubner OT, MOT

## 2024-10-15 DIAGNOSIS — J189 Pneumonia, unspecified organism: Secondary | ICD-10-CM | POA: Diagnosis not present

## 2024-10-15 DIAGNOSIS — J441 Chronic obstructive pulmonary disease with (acute) exacerbation: Secondary | ICD-10-CM

## 2024-10-15 LAB — GLUCOSE, CAPILLARY
Glucose-Capillary: 66 mg/dL — ABNORMAL LOW (ref 70–99)
Glucose-Capillary: 84 mg/dL (ref 70–99)
Glucose-Capillary: 148 mg/dL — ABNORMAL HIGH (ref 70–99)
Glucose-Capillary: 156 mg/dL — ABNORMAL HIGH (ref 70–99)
Glucose-Capillary: 201 mg/dL — ABNORMAL HIGH (ref 70–99)

## 2024-10-15 LAB — COMPREHENSIVE METABOLIC PANEL WITH GFR
ALT: 37 U/L (ref 0–44)
AST: 55 U/L — ABNORMAL HIGH (ref 15–41)
Albumin: 3.3 g/dL — ABNORMAL LOW (ref 3.5–5.0)
Alkaline Phosphatase: 31 U/L — ABNORMAL LOW (ref 38–126)
Anion gap: 8 (ref 5–15)
BUN: 13 mg/dL (ref 8–23)
CO2: 26 mmol/L (ref 22–32)
Calcium: 8.3 mg/dL — ABNORMAL LOW (ref 8.9–10.3)
Chloride: 109 mmol/L (ref 98–111)
Creatinine, Ser: 0.5 mg/dL (ref 0.44–1.00)
GFR, Estimated: >60 mL/min (ref >60)
Glucose, Bld: 107 mg/dL — ABNORMAL HIGH (ref 70–99)
Potassium: 3.4 mmol/L — ABNORMAL LOW (ref 3.5–5.1)
Sodium: 144 mmol/L (ref 135–145)
Total Bilirubin: 0.3 mg/dL (ref 0.0–1.2)
Total Protein: 5.7 g/dL — ABNORMAL LOW (ref 6.5–8.1)

## 2024-10-15 LAB — MAGNESIUM: Magnesium: 1.6 mg/dL — ABNORMAL LOW (ref 1.7–2.4)

## 2024-10-15 LAB — CBC
HCT: 30.2 % — ABNORMAL LOW (ref 36.0–46.0)
Hemoglobin: 9.6 g/dL — ABNORMAL LOW (ref 12.0–15.0)
MCH: 28.4 pg (ref 26.0–34.0)
MCHC: 31.8 g/dL (ref 30.0–36.0)
MCV: 89.3 fL (ref 80.0–100.0)
Platelets: 248 K/uL (ref 150–400)
RBC: 3.38 MIL/uL — ABNORMAL LOW (ref 3.87–5.11)
RDW: 16.7 % — ABNORMAL HIGH (ref 11.5–15.5)
WBC: 8.2 K/uL (ref 4.0–10.5)
nRBC: 0 % (ref 0.0–0.2)

## 2024-10-15 LAB — PHOSPHORUS: Phosphorus: 2.7 mg/dL (ref 2.5–4.6)

## 2024-10-15 MED ORDER — PREDNISONE 10 MG PO TABS: 10.0000 mg | ORAL_TABLET | Freq: Every day | ORAL | Status: DC

## 2024-10-15 MED ORDER — PREDNISONE 20 MG PO TABS: 20.0000 mg | ORAL_TABLET | Freq: Every day | ORAL | Status: DC

## 2024-10-15 MED ORDER — MAGNESIUM OXIDE -MG SUPPLEMENT 400 (240 MG) MG PO TABS
400.0000 mg | ORAL_TABLET | Freq: Two times a day (BID) | ORAL | Status: DC
  Administered 2024-10-15 – 2024-10-16 (×3): 400 mg via ORAL
  Filled 2024-10-15 (×2): qty 1

## 2024-10-15 MED ORDER — PREDNISONE 5 MG PO TABS: 5.0000 mg | ORAL_TABLET | Freq: Every day | ORAL | Status: DC

## 2024-10-15 MED ORDER — PREDNISONE 20 MG PO TABS
20.0000 mg | ORAL_TABLET | Freq: Every day | ORAL | Status: DC
  Administered 2024-10-16: 20 mg via ORAL
  Filled 2024-10-15: qty 1

## 2024-10-15 MED ORDER — INSULIN ASPART 100 UNIT/ML IJ SOLN
0.0000 [IU] | Freq: Every day | INTRAMUSCULAR | Status: DC
  Administered 2024-10-15: 2 [IU] via SUBCUTANEOUS

## 2024-10-15 MED ORDER — POTASSIUM CHLORIDE CRYS ER 20 MEQ PO TBCR
40.0000 meq | EXTENDED_RELEASE_TABLET | ORAL | Status: AC
  Administered 2024-10-15: 40 meq via ORAL
  Filled 2024-10-15: qty 2

## 2024-10-15 MED ORDER — INSULIN ASPART 100 UNIT/ML IJ SOLN
0.0000 [IU] | Freq: Three times a day (TID) | INTRAMUSCULAR | Status: DC
  Administered 2024-10-15: 2 [IU] via SUBCUTANEOUS

## 2024-10-15 MED ORDER — POTASSIUM CHLORIDE CRYS ER 20 MEQ PO TBCR
40.0000 meq | EXTENDED_RELEASE_TABLET | Freq: Once | ORAL | Status: DC
  Filled 2024-10-15: qty 2

## 2024-10-15 NOTE — TOC Progression Note (Addendum)
 Transition of Care [facility]) - Progression Note    Patient Details  Name: [name] White MRN: [id number] Date of Birth: [birth year]  [facility] CM/SW Contact  Lucie Lunger, [place] Phone Number: 10/15/2024, 11:45 AM  Clinical Narrative:    CSW spoke to administrator Tammy with [facility] who states they will arrive after lunch to complete assessment with pt. CSW will follow for updates after assessment. TOC to follow.   Addendum: CSW spoke to [place] with [facility] who states she was able to assess pt and feels pt is at her baseline and can return to ALF once medically stable. TOC to follow.   Expected Discharge Plan: Assisted Living Barriers to Discharge: Continued Medical Work up               Expected Discharge Plan and Services In-house Referral: Clinical Social Work Discharge Planning Services: CM Consult Post Acute Care Choice: Home Health Living arrangements for the past 2 months: Assisted Living Facility                                       Social Drivers of Health (SDOH) Interventions SDOH Screenings   Food Insecurity: No Food Insecurity (10/11/2024)  Housing: Low Risk  (10/11/2024)  Transportation Needs: No Transportation Needs (10/11/2024)  Utilities: Not At Risk (10/11/2024)  Depression (PHQ2-9): Low Risk  (01/28/2020)  Social Connections: Socially Isolated (10/11/2024)  Tobacco Use: Low Risk  (10/11/2024)    Readmission Risk Interventions    10/12/2024   10:32 AM 05/23/2024   10:18 AM 04/25/2024    1:46 PM  Readmission Risk Prevention Plan  Transportation Screening Complete Complete Complete  PCP or Specialist Appt within 3-5 Days   Not Complete  HRI or Home Care Consult Complete Complete Complete  Social Work Consult for Recovery Care Planning/Counseling Complete Complete Complete  Palliative Care Screening Not Applicable Not Applicable Not Complete  Medication Review Oceanographer) Complete Complete Complete

## 2024-10-15 NOTE — Progress Notes (Signed)
 PROGRESS NOTE    [name] White  FMW:[id number] DOB: 03-12-1941 DOA: 10/11/2024 PCP: [name], MD    Brief Narrative:  83 year old with history of type 2 diabetes on metformin , hypertension, hypothyroidism, dementia, previous history of stroke currently living in assisted living facility brought to the emergency room with 2 days of coughing, wheezing and altered mental status.  Initially on 2 L oxygen at ER.  CT chest with left posterior basilar opacities likely atelectasis.  Admitted with COPD exacerbation.  Subjective: Patient seen and examined.  She has some wheezing otherwise denies any complaints.  Her sister at the bedside stated that patient did eat well today and her mentation is almost back to normal.  Patient still has significant audible wheezing and dry cough.  Afebrile.  Assessment & Plan:   Left lower lobe pneumonia, acute hypoxemic respiratory failure secondary to COPD exacerbation. Patient denies history of COPD, however she is on chronic bronchodilator therapy and LABA/steroids inhaler at home. Will continue Rocephin  and azithromycin  today.  Cultures has been negative.  Flu, COVID, RSV negative.   Legionella and Streptococcus pending.   Continue bronchodilator therapy, steroid inhalers, chest physiotherapy, incentive spirometry, deep breathing exercises, sputum induction, mucolytic's. Weaning off on room air. On high-dose steroids last 3 days.  Slow steroid taper.  Acute metabolic encephalopathy with underlying history of dementia: CT head without acute findings.  Clinically improving. Back on Lyrica  half dose-May go on full doses on discharge.  Patient is also on venlafaxine  and Trintellix  that is continued.  Type 2 diabetes, poorly controlled with hyperglycemia.  Known A1c 9.8.  Only on metformin  at home. Hypoglycemic events noted.  Was on insulin  high doses. Does have poor appetite.  Keep on sliding scale insulin .  Quickly tapering off of steroids.  GERD: On  PPI.  Hypokalemia/hypomagnesemia: Replace today.  Will keep on a scheduled magnesium  and potassium.    DVT prophylaxis: enoxaparin  (LOVENOX ) injection 40 mg Start: 10/11/24 2200   Code Status: DNR with limited intervention Family Communication: Sister at the bedside Disposition Plan: Status is: Inpatient Remains inpatient appropriate because: Still significantly wheezing     Consultants:  None  Procedures:  None  Antimicrobials:  Rocephin  azithromycin  10/17--     Objective: Vitals:   10/14/24 1441 10/14/24 2038 10/15/24 0522 10/15/24 0725  BP:  97/74 94/62   Pulse:  (!) 109 74   Resp:  20 18   Temp:  98.8 F (37.1 C) 97.9 F (36.6 C)   TempSrc:  Oral Oral   SpO2: 94% 93% 93% 94%  Weight:      Height:        Intake/Output Summary (Last 24 hours) at 10/15/2024 1151 Last data filed at 10/14/2024 2140 Gross per 24 hour  Intake --  Output 350 ml  Net -350 ml   Filed Weights   10/11/24 1044  Weight: 59 kg    Examination:  General exam: Appears calm and comfortable.  Pleasant and interactive. Clear alert awake and oriented.  Appropriately answering. Respiratory system: Audible wheezing.  Not in any distress.  Expiratory wheezes on all lung fields.  No crackles.  On room air today. Cardiovascular system: S1 & S2 heard, RRR. No JVD, murmurs, rubs, gallops or clicks. No pedal edema. Gastrointestinal system: Abdomen is nondistended, soft and nontender. No organomegaly or masses felt. Normal bowel sounds heard.    Data Reviewed: I have personally reviewed following labs and imaging studies  CBC: Recent Labs  Lab 10/11/24 1121 10/12/24 0435 10/15/24 0414  WBC 10.0 7.2 8.2  NEUTROABS 6.6  --   --   HGB 12.2 12.3 9.6*  HCT 38.1 37.8 30.2*  MCV 89.6 86.7 89.3  PLT 236 257 248   Basic Metabolic Panel: Recent Labs  Lab 10/11/24 1121 10/12/24 0435 10/15/24 0414  NA 134* 138 144  K 4.3 4.3 3.4*  CL 100 103 109  CO2 21* 26 26  GLUCOSE 119* 210* 107*   BUN 8 8 13   CREATININE 0.55 0.52 0.50  CALCIUM  9.1 9.3 8.3*  MG  --   --  1.6*  PHOS  --   --  2.7   GFR: Estimated Creatinine Clearance: 49.6 mL/min (by C-G formula based on SCr of 0.5 mg/dL). Liver Function Tests: Recent Labs  Lab 10/11/24 1121 10/15/24 0414  AST 33 55*  ALT 13 37  ALKPHOS 40 31*  BILITOT 0.8 0.3  PROT 6.6 5.7*  ALBUMIN 3.6 3.3*   No results for input(s): LIPASE, AMYLASE in the last 168 hours. No results for input(s): AMMONIA in the last 168 hours. Coagulation Profile: No results for input(s): INR, PROTIME in the last 168 hours. Cardiac Enzymes: No results for input(s): CKTOTAL, CKMB, CKMBINDEX, TROPONINI in the last 168 hours. BNP (last 3 results) No results for input(s): PROBNP in the last 8760 hours. HbA1C: No results for input(s): HGBA1C in the last 72 hours. CBG: Recent Labs  Lab 10/14/24 1618 10/14/24 2140 10/15/24 0735 10/15/24 0812 10/15/24 1110  GLUCAP 163* 139* 66* 84 148*   Lipid Profile: No results for input(s): CHOL, HDL, LDLCALC, TRIG, CHOLHDL, LDLDIRECT in the last 72 hours. Thyroid  Function Tests: No results for input(s): TSH, T4TOTAL, FREET4, T3FREE, THYROIDAB in the last 72 hours. Anemia Panel: No results for input(s): VITAMINB12, FOLATE, FERRITIN, TIBC, IRON, RETICCTPCT in the last 72 hours. Sepsis Labs: No results for input(s): PROCALCITON, LATICACIDVEN in the last 168 hours.  Recent Results (from the past 240 hours)  Resp panel by RT-PCR (RSV, Flu A&B, Covid) Anterior Nasal Swab     Status: None   Collection Time: 10/11/24 10:48 AM   Specimen: Anterior Nasal Swab  Result Value Ref Range Status   SARS Coronavirus 2 by RT PCR NEGATIVE NEGATIVE Final    Comment: (NOTE) SARS-CoV-2 target nucleic acids are NOT DETECTED.  The SARS-CoV-2 RNA is generally detectable in upper respiratory specimens during the acute phase of infection. The lowest concentration of  SARS-CoV-2 viral copies this assay can detect is 138 copies/mL. A negative result does not preclude SARS-Cov-2 infection and should not be used as the sole basis for treatment or other patient management decisions. A negative result may occur with  improper specimen collection/handling, submission of specimen other than nasopharyngeal swab, presence of viral mutation(s) within the areas targeted by this assay, and inadequate number of viral copies(<138 copies/mL). A negative result must be combined with clinical observations, patient history, and epidemiological information. The expected result is Negative.  Fact Sheet for Patients:  BloggerCourse.com  Fact Sheet for Healthcare Providers:  SeriousBroker.it  This test is no t yet approved or cleared by the [place]  FDA and  has been authorized for detection and/or diagnosis of SARS-CoV-2 by FDA under an Emergency Use Authorization (EUA). This EUA will remain  in effect (meaning this test can be used) for the duration of the COVID-19 declaration under Section 564(b)(1) of the Act, 21 U.S.C.section 360bbb-3(b)(1), unless the authorization is terminated  or revoked sooner.       Influenza A by PCR NEGATIVE NEGATIVE  Final   Influenza B by PCR NEGATIVE NEGATIVE Final    Comment: (NOTE) The Xpert Xpress SARS-CoV-2/FLU/RSV plus assay is intended as an aid in the diagnosis of influenza from Nasopharyngeal swab specimens and should not be used as a sole basis for treatment. Nasal washings and aspirates are unacceptable for Xpert Xpress SARS-CoV-2/FLU/RSV testing.  Fact Sheet for Patients: BloggerCourse.com  Fact Sheet for Healthcare Providers: SeriousBroker.it  This test is not yet approved or cleared by the [place]  FDA and has been authorized for detection and/or diagnosis of SARS-CoV-2 by FDA under an Emergency Use  Authorization (EUA). This EUA will remain in effect (meaning this test can be used) for the duration of the COVID-19 declaration under Section 564(b)(1) of the Act, 21 U.S.C. section 360bbb-3(b)(1), unless the authorization is terminated or revoked.     Resp Syncytial Virus by PCR NEGATIVE NEGATIVE Final    Comment: (NOTE) Fact Sheet for Patients: BloggerCourse.com  Fact Sheet for Healthcare Providers: SeriousBroker.it  This test is not yet approved or cleared by the [place]  FDA and has been authorized for detection and/or diagnosis of SARS-CoV-2 by FDA under an Emergency Use Authorization (EUA). This EUA will remain in effect (meaning this test can be used) for the duration of the COVID-19 declaration under Section 564(b)(1) of the Act, 21 U.S.C. section 360bbb-3(b)(1), unless the authorization is terminated or revoked.  Performed at [facility], [address], [place], [place] [address]   Blood culture (routine x 2)     Status: None (Preliminary result)   Collection Time: 10/11/24  3:25 PM   Specimen: BLOOD RIGHT ARM  Result Value Ref Range Status   Specimen Description BLOOD RIGHT ARM  Final   Special Requests   Final    BOTTLES DRAWN AEROBIC AND ANAEROBIC Blood Culture adequate volume   Culture   Final    NO GROWTH 4 DAYS Performed at [facility], [address], [place], [place] [address]    Report Status PENDING  Incomplete  Blood culture (routine x 2)     Status: None (Preliminary result)   Collection Time: 10/11/24  3:25 PM   Specimen: BLOOD LEFT ARM  Result Value Ref Range Status   Specimen Description BLOOD LEFT ARM  Final   Special Requests   Final    BOTTLES DRAWN AEROBIC AND ANAEROBIC Blood Culture adequate volume   Culture   Final    NO GROWTH 4 DAYS Performed at [facility], [address] [place], [place] [address]    Report Status PENDING  Incomplete         Radiology Studies: No results  found.      Scheduled Meds:  aspirin  EC  81 mg Oral Daily   atorvastatin   80 mg Oral Daily   enoxaparin  (LOVENOX ) injection  40 mg Subcutaneous Q24H   feeding supplement  237 mL Oral BID BM   fluticasone  furoate-vilanterol  1 puff Inhalation Daily   guaiFENesin   600 mg Oral BID   insulin  aspart  0-5 Units Subcutaneous QHS   insulin  aspart  0-9 Units Subcutaneous TID WC   ipratropium-albuterol   3 mL Nebulization TID   levothyroxine   50 mcg Oral Q0600   magnesium  oxide  400 mg Oral BID   pantoprazole   40 mg Oral Daily   [START ON 10/16/2024] predniSONE   20 mg Oral Q breakfast   Followed by   NOREEN ON 10/18/2024] predniSONE   10 mg Oral Q breakfast   Followed by   NOREEN ON 10/20/2024]  predniSONE   5 mg Oral Q breakfast   pregabalin   75 mg Oral BID   umeclidinium bromide   1 puff Inhalation Daily   venlafaxine  XR  150 mg Oral Q breakfast   vortioxetine  HBr  10 mg Oral Daily   Continuous Infusions:  azithromycin  500 mg (10/14/24 1639)   cefTRIAXone  (ROCEPHIN )  IV 2 g (10/14/24 1547)     LOS: 4 days    Time spent: 51 minutes    Renato Applebaum, MD [facility]

## 2024-10-15 NOTE — Inpatient Diabetes Management (Signed)
 Inpatient Diabetes Program Recommendations  AACE/ADA: New Consensus Statement on Inpatient Glycemic Control  Target Ranges:  Prepandial:   less than 140 mg/dL      Peak postprandial:   less than 180 mg/dL (1-2 hours)      Critically ill patients:  140 - 180 mg/dL    Latest Reference Range & Units 10/14/24 07:22 10/14/24 11:11 10/14/24 16:18 10/14/24 21:40 10/15/24 07:35 10/15/24 08:12  Glucose-Capillary 70 - 99 mg/dL 869 (H) 860 (H) 836 (H) 139 (H) 66 (L) 84   Review of Glycemic Control  Diabetes history: DM2 Outpatient Diabetes medications: Metformin  1000 mg BID Current orders for Inpatient glycemic control: Semglee  12 units at bedtime, Novolog  0-20 units TID with meals, Novolog  0-5 units at bedtime; Solucortef 50 mg Q8H  Inpatient Diabetes Program Recommendations:    Insulin : CBG 66 mg/dl this morning.  If steroids are continued as ordered, please consider decreasing Semglee  to 9 units at bedtime.  Thanks, Earnie Gainer, RN, MSN, CDCES Diabetes Coordinator Inpatient Diabetes Program [phone] (Team Pager from 8am to 5pm)

## 2024-10-16 DIAGNOSIS — I10 Essential (primary) hypertension: Secondary | ICD-10-CM | POA: Diagnosis not present

## 2024-10-16 DIAGNOSIS — E1142 Type 2 diabetes mellitus with diabetic polyneuropathy: Secondary | ICD-10-CM | POA: Diagnosis not present

## 2024-10-16 DIAGNOSIS — N390 Urinary tract infection, site not specified: Secondary | ICD-10-CM

## 2024-10-16 DIAGNOSIS — J189 Pneumonia, unspecified organism: Secondary | ICD-10-CM | POA: Diagnosis not present

## 2024-10-16 DIAGNOSIS — K219 Gastro-esophageal reflux disease without esophagitis: Secondary | ICD-10-CM | POA: Diagnosis not present

## 2024-10-16 LAB — CULTURE, BLOOD (ROUTINE X 2)
Culture: NO GROWTH
Culture: NO GROWTH
Special Requests: ADEQUATE
Special Requests: ADEQUATE

## 2024-10-16 LAB — GLUCOSE, CAPILLARY
Glucose-Capillary: 82 mg/dL (ref 70–99)
Glucose-Capillary: 135 mg/dL — ABNORMAL HIGH (ref 70–99)

## 2024-10-16 MED ORDER — DOXYCYCLINE HYCLATE 100 MG PO TABS: 100.0000 mg | ORAL_TABLET | Freq: Two times a day (BID) | ORAL | 0 refills | Status: AC

## 2024-10-16 MED ORDER — PREDNISONE 20 MG PO TABS
ORAL_TABLET | ORAL | 0 refills | Status: DC
Start: 2024-10-16 — End: 2024-11-10

## 2024-10-16 MED ORDER — NITROFURANTOIN MACROCRYSTAL 100 MG PO CAPS: 100.0000 mg | ORAL_CAPSULE | Freq: Every day | ORAL | Status: DC

## 2024-10-16 MED ORDER — PREDNISONE 5 MG PO TABS: 5.0000 mg | ORAL_TABLET | Freq: Every day | ORAL | 1 refills | Status: DC

## 2024-10-16 MED ORDER — CEFDINIR 300 MG PO CAPS: 300.0000 mg | ORAL_CAPSULE | Freq: Two times a day (BID) | ORAL | 0 refills | Status: AC

## 2024-10-16 MED ORDER — FUROSEMIDE 20 MG PO TABS
20.0000 mg | ORAL_TABLET | Freq: Every day | ORAL | Status: DC | PRN
Start: 2024-10-16 — End: 2024-11-12

## 2024-10-16 MED ORDER — BUTALBITAL-APAP-CAFFEINE 50-325-40 MG PO TABS
2.0000 | ORAL_TABLET | Freq: Once | ORAL | Status: AC
  Administered 2024-10-16: 2 via ORAL
  Filled 2024-10-16: qty 2

## 2024-10-16 MED ORDER — UMECLIDINIUM BROMIDE 62.5 MCG/ACT IN AEPB: 1.0000 | INHALATION_SPRAY | Freq: Every day | RESPIRATORY_TRACT | 1 refills | Status: AC

## 2024-10-16 MED ORDER — DM-GUAIFENESIN ER 30-600 MG PO TB12: 1.0000 | ORAL_TABLET | Freq: Two times a day (BID) | ORAL | 0 refills | Status: AC

## 2024-10-16 MED ORDER — OMEPRAZOLE 20 MG PO CPDR: 20.0000 mg | DELAYED_RELEASE_CAPSULE | Freq: Two times a day (BID) | ORAL | 2 refills | Status: AC

## 2024-10-16 MED ORDER — FLUTICASONE FUROATE-VILANTEROL 200-25 MCG/ACT IN AEPB: 1.0000 | INHALATION_SPRAY | Freq: Every day | RESPIRATORY_TRACT | 1 refills | Status: AC

## 2024-10-16 MED ORDER — IPRATROPIUM-ALBUTEROL 0.5-2.5 (3) MG/3ML IN SOLN: 3.0000 mL | RESPIRATORY_TRACT | 2 refills | Status: DC | PRN

## 2024-10-16 NOTE — Plan of Care (Signed)
  Problem: Education: Goal: Knowledge of General Education information will improve Description: Including pain rating scale, medication(s)/side effects and non-pharmacologic comfort measures Outcome: Progressing   Problem: Health Behavior/Discharge Planning: Goal: Ability to manage health-related needs will improve Outcome: Progressing   Problem: Clinical Measurements: Goal: Ability to maintain clinical measurements within normal limits will improve Outcome: Progressing Goal: Will remain free from infection Outcome: Progressing Goal: Diagnostic test results will improve Outcome: Progressing Goal: Respiratory complications will improve Outcome: Progressing Goal: Cardiovascular complication will be avoided Outcome: Progressing   Problem: Activity: Goal: Risk for activity intolerance will decrease Outcome: Progressing   Problem: Nutrition: Goal: Adequate nutrition will be maintained Outcome: Progressing   Problem: Coping: Goal: Level of anxiety will decrease Outcome: Progressing   Problem: Elimination: Goal: Will not experience complications related to bowel motility Outcome: Progressing Goal: Will not experience complications related to urinary retention Outcome: Progressing   Problem: Pain Managment: Goal: General experience of comfort will improve and/or be controlled Outcome: Progressing   Problem: Safety: Goal: Ability to remain free from injury will improve Outcome: Progressing   Problem: Skin Integrity: Goal: Risk for impaired skin integrity will decrease Outcome: Progressing   Problem: Education: Goal: Knowledge of disease or condition will improve Outcome: Progressing Goal: Knowledge of the prescribed therapeutic regimen will improve Outcome: Progressing Goal: Individualized Educational Video(s) Outcome: Progressing   Problem: Activity: Goal: Ability to tolerate increased activity will improve Outcome: Progressing Goal: Will verbalize the  importance of balancing activity with adequate rest periods Outcome: Progressing   Problem: Respiratory: Goal: Ability to maintain a clear airway will improve Outcome: Progressing Goal: Levels of oxygenation will improve Outcome: Progressing Goal: Ability to maintain adequate ventilation will improve Outcome: Progressing   Problem: Activity: Goal: Ability to tolerate increased activity will improve Outcome: Progressing   Problem: Clinical Measurements: Goal: Ability to maintain a body temperature in the normal range will improve Outcome: Progressing   Problem: Respiratory: Goal: Ability to maintain adequate ventilation will improve Outcome: Progressing Goal: Ability to maintain a clear airway will improve Outcome: Progressing   Problem: Education: Goal: Ability to describe self-care measures that may prevent or decrease complications (Diabetes Survival Skills Education) will improve Outcome: Progressing Goal: Individualized Educational Video(s) Outcome: Progressing   Problem: Coping: Goal: Ability to adjust to condition or change in health will improve Outcome: Progressing   Problem: Fluid Volume: Goal: Ability to maintain a balanced intake and output will improve Outcome: Progressing   Problem: Health Behavior/Discharge Planning: Goal: Ability to identify and utilize available resources and services will improve Outcome: Progressing Goal: Ability to manage health-related needs will improve Outcome: Progressing   Problem: Metabolic: Goal: Ability to maintain appropriate glucose levels will improve Outcome: Progressing   Problem: Nutritional: Goal: Maintenance of adequate nutrition will improve Outcome: Progressing Goal: Progress toward achieving an optimal weight will improve Outcome: Progressing   Problem: Skin Integrity: Goal: Risk for impaired skin integrity will decrease Outcome: Progressing   Problem: Tissue Perfusion: Goal: Adequacy of tissue  perfusion will improve Outcome: Progressing

## 2024-10-16 NOTE — Progress Notes (Signed)
 Patient complains of 8/10 headache this AM. PRN Tylenol  given with minimal results of relief. Manfred, MD notified. One time order of Fioricet given.

## 2024-10-16 NOTE — TOC Transition Note (Signed)
 Transition of Care [facility]) - Discharge Note   Patient Details  Name: [name] White MRN: [id number] Date of Birth: 1941-10-08  [facility] CM/SW Contact:  Lucie Lunger, LCSWA Phone Number: 10/16/2024, 2:32 PM   Clinical Narrative:    CSW updated that pt is medically stable for D/C back to [facility] today. CSW updated Kyra at facility who states they will transport pt. RN updated. D/C summary and updated Fl2 faxed to facility for review. CSW spoke with pts daughter to provide update on plan for D/C. TOC signing off.   Final next level of care: Assisted Living Barriers to Discharge: Barriers Resolved   Patient Goals and CMS Choice Patient states their goals for this hospitalization and ongoing recovery are:: return to ALF CMS Medicare.gov Compare Post Acute Care list provided to:: Patient Represenative (must comment) Choice offered to / list presented to : Adult Children      Discharge Placement                Patient to be transferred to facility by: Facility staff Name of family member notified: daughter Olam Patient and family notified of of transfer: 10/16/24  Discharge Plan and Services Additional resources added to the After Visit Summary for   In-house Referral: Clinical Social Work Discharge Planning Services: CM Consult Post Acute Care Choice: Home Health                               Social Drivers of Health (SDOH) Interventions SDOH Screenings   Food Insecurity: No Food Insecurity (10/11/2024)  Housing: Low Risk  (10/11/2024)  Transportation Needs: No Transportation Needs (10/11/2024)  Utilities: Not At Risk (10/11/2024)  Depression (PHQ2-9): Low Risk  (01/28/2020)  Social Connections: Socially Isolated (10/11/2024)  Tobacco Use: Low Risk  (10/11/2024)     Readmission Risk Interventions    10/12/2024   10:32 AM 05/23/2024   10:18 AM 04/25/2024    1:46 PM  Readmission Risk Prevention Plan  Transportation Screening Complete  Complete Complete  PCP or Specialist Appt within 3-5 Days   Not Complete  HRI or Home Care Consult Complete Complete Complete  Social Work Consult for Recovery Care Planning/Counseling Complete Complete Complete  Palliative Care Screening Not Applicable Not Applicable Not Complete  Medication Review Oceanographer) Complete Complete Complete

## 2024-10-16 NOTE — Care Management Important Message (Signed)
 Important Message  Patient Details  Name: [name] White MRN: [id number] Date of Birth: [birth year]   Important Message Given:  Yes - Medicare IM     [name] White 10/16/2024, 1:01 PM

## 2024-10-16 NOTE — Discharge Summary (Signed)
 Physician Discharge Summary   Patient: [name] White MRN: [id number] DOB: 07-19-1941  Admit date:     10/11/2024  Discharge date: 10/16/24  Discharge Physician: Eric Nunnery   PCP: [name], MD   Recommendations at discharge:  Repeat basic metabolic panel to follow electrolytes and renal function Reassessed blood pressure and adjust antihypertensive regimen as needed Continue to follow resolution of patient's headaches and adjust medications for migraine as required. - Reassess CBG fluctuation and adjust hypoglycemic regimen as needed Repeat chest x-ray 6-8 weeks to assure resolution of infiltrates.  Discharge Diagnoses: Active Problems:   Essential hypertension   Type 2 diabetes mellitus with peripheral neuropathy (HCC)   GERD without esophagitis   CAP (community acquired pneumonia) Hypokalemia/hypomagnesemia  Hospital Course:  Brief Narrative:  83 year old with history of type 2 diabetes on metformin , hypertension, hypothyroidism, dementia, previous history of stroke currently living in assisted living facility brought to the emergency room with 2 days of coughing, wheezing and altered mental status.  Initially on 2 L oxygen at ER.  CT chest with left posterior basilar opacities likely atelectasis.  Admitted with COPD exacerbation.  Assessment and Plan: Left lower lobe pneumonia, acute hypoxemic respiratory failure secondary to COPD exacerbation. - Excellent response to nebulizer management - At time of discharge negative culture appreciated - Respiratory panel by PCR: Negative for influenza, RSV and COVID. - No fever. - Patient has been discharged home on oral antibiotics to complete therapy - Continue steroids tapering, resumption of nebulizer and inhaler bronchodilator management and outpatient follow-up with PCP in the next 10 days.   - Desaturation screening had demonstrated no need for oxygen supplementation at time of discharge.    Acute metabolic encephalopathy  with underlying history of dementia: -CT head without acute findings.  Clinically improving. -Mentation back to baseline at discharge - Continue supportive care, adequate hydration and resumption of prior to admission medications.   Type 2 diabetes, poorly controlled with hyperglycemia.  Known A1c 9.8.   -Only on metformin  at home. -Modified carbohydrate diet has been discussed with patient - Continue to follow CBG fluctuation and further adjust hypoglycemic regimen as needed - Anticipating elevation in patient's CBGs in the setting of his steroids usage.   GERD:  - Continue adjusted dose of PPI.     Hypokalemia/hypomagnesemia:  - Electrolytes repleted and within normal limits at discharge - Continue to maintain adequate hydration and oral intake for good nutritional support.  Headache/migraines - Resume prior to admission as needed Tylenol  and rizatriptan . - Continue to follow-up with PCP.  Consultants: None Procedures performed: See below for x-ray reports. Disposition: Assisted living Diet recommendation: Heart healthy/modified carbohydrate diet.  DISCHARGE MEDICATION: Allergies as of 10/16/2024       Reactions   Betadine [povidone Iodine] Hives   Penicillins Rash        Medication List     TAKE these medications    acetaminophen  325 MG tablet Commonly known as: TYLENOL  Take 2 tablets (650 mg total) by mouth every 6 (six) hours as needed for mild pain (pain score 1-3) (or Fever >/= 101).   albuterol  108 (90 Base) MCG/ACT inhaler Commonly known as: VENTOLIN  HFA Inhale 2 puffs into the lungs every 4 (four) hours as needed for wheezing or shortness of breath.   aspirin  EC 81 MG tablet Take 81 mg by mouth daily. Swallow whole.   atorvastatin  40 MG tablet Commonly known as: LIPITOR TAKE (2) TABLETS (80 MG) BY MOUTH DAILY. What changed: See the new instructions.  cefdinir  300 MG capsule Commonly known as: OMNICEF  Take 1 capsule (300 mg total) by mouth 2 (two)  times daily for 4 days.   cyanocobalamin  1000 MCG/ML injection Commonly known as: VITAMIN B12 Inject 1,000 mcg into the muscle every 30 (thirty) days.   cycloSPORINE  0.05 % ophthalmic emulsion Commonly known as: RESTASIS  Place 1 drop into both eyes 2 (two) times daily.   dextromethorphan -guaiFENesin  30-600 MG 12hr tablet Commonly known as: MUCINEX  DM Take 1 tablet by mouth 2 (two) times daily for 5 days.   dimethicone 1 % cream Apply 1 Application topically 2 (two) times daily as needed for dry skin (pressure injury on sacrum).   doxycycline 100 MG tablet Commonly known as: VIBRA-TABS Take 1 tablet (100 mg total) by mouth 2 (two) times daily for 4 days.   fluticasone  furoate-vilanterol 200-25 MCG/ACT Aepb Commonly known as: Breo Ellipta  Inhale 1 puff into the lungs daily.   furosemide  20 MG tablet Commonly known as: Lasix  Take 1 tablet (20 mg total) by mouth daily as needed for edema or fluid. What changed:  when to take this reasons to take this   Imvexxy  Maintenance Pack 10 MCG Inst Generic drug: Estradiol  Place 1 suppository vaginally 2 (two) times a week.   ipratropium-albuterol  0.5-2.5 (3) MG/3ML Soln Commonly known as: DUONEB Take 3 mLs by nebulization every 4 (four) hours as needed (wheezing and SOB). What changed: See the new instructions.   levothyroxine  50 MCG tablet Commonly known as: SYNTHROID  Take 50 mcg by mouth daily.   metFORMIN  1000 MG tablet Commonly known as: GLUCOPHAGE  Take 1,000 mg by mouth 2 (two) times daily.   midodrine  5 MG tablet Commonly known as: PROAMATINE  Take 5 mg by mouth 3 (three) times daily with meals.   nitrofurantoin  100 MG capsule Commonly known as: Macrodantin  Take 1 capsule (100 mg total) by mouth daily. Start taking on: October 21, 2024 What changed: These instructions start on October 21, 2024. If you are unsure what to do until then, ask your doctor or other care provider.   NONFORMULARY OR COMPOUNDED ITEM If  patient is incontinent of urine and/or stool, SNF staff are instructed to cleanse patient's genital area at least once every 8 hours and change diaper if soiled.   NONFORMULARY OR COMPOUNDED ITEM SNF staff are instructed to encourage hydration with water  at least once every 2-4 hours while patient is awake.   NONFORMULARY OR COMPOUNDED ITEM SNF staff are instructed to prompt toileting and assist patient with ambulation to toilet at least once every 4-6 hours while awake.   omeprazole 20 MG capsule Commonly known as: PRILOSEC Take 1 capsule (20 mg total) by mouth 2 (two) times daily before a meal. What changed: when to take this   polyethylene glycol 17 g packet Commonly known as: MIRALAX  / GLYCOLAX  Take 17 g by mouth daily as needed for mild constipation.   potassium chloride  10 MEQ tablet Commonly known as: KLOR-CON  Take 10 mEq by mouth daily.   predniSONE  20 MG tablet Commonly known as: DELTASONE  Take 2 tablets by mouth daily X 2 days; then 1 tablet by mouth daily X 3 days, then 1/2 tablet by mouth daily X 4 days and then start prednisone  5mg  daily (1 week).   predniSONE  5 MG tablet Commonly known as: DELTASONE  Take 1 tablet (5 mg total) by mouth daily with breakfast. Start taking on: October 24, 2024   pregabalin  150 MG capsule Commonly known as: LYRICA  Take 1 capsule (150 mg total) by mouth  2 (two) times daily.   rizatriptan  10 MG disintegrating tablet Commonly known as: MAXALT -MLT Take 10 mg by mouth as needed for migraine.   Thera-M Tabs Take 1 tablet by mouth daily.   tiZANidine 2 MG tablet Commonly known as: ZANAFLEX Take 2 mg by mouth 2 (two) times daily as needed for muscle spasms.   traZODone  50 MG tablet Commonly known as: DESYREL  Take 1 tablet (50 mg total) by mouth at bedtime as needed for sleep.   Trintellix  10 MG Tabs tablet Generic drug: vortioxetine  HBr Take 10 mg by mouth daily.   umeclidinium bromide  62.5 MCG/ACT Aepb Commonly known as:  INCRUSE ELLIPTA  Inhale 1 puff into the lungs daily.   venlafaxine  XR 150 MG 24 hr capsule Commonly known as: EFFEXOR -XR Take 150 mg by mouth every morning.        Follow-up Information     [name], MD. Schedule an appointment as soon as possible for a visit in 10 day(s).   Specialty: Internal Medicine Contact information: [address] [place] [facility] [address] [phone]                Discharge Exam: Filed Weights   10/11/24 1044  Weight: 59 kg   General exam: Alert, awake, following commands appropriately and in no acute distress.  Good saturation on room air appreciated. Respiratory system: Scattered rhonchi and mild expiratory wheezing appreciated on exam; speaking in full sentences and no using accessory muscles. Cardiovascular system:RRR. No murmurs, rubs, gallops. Gastrointestinal system: Abdomen is nondistended, soft and nontender. No organomegaly or masses felt. Normal bowel sounds heard. Central nervous system: Moving 4 limbs spontaneously.  No focal neurological deficits. Extremities: No cyanosis, clubbing or edema. Skin: No petechiae. Psychiatry: Judgement and insight appear normal. Mood & affect appropriate.    Condition at discharge: Stable and improved.  The results of significant diagnostics from this hospitalization (including imaging, microbiology, ancillary and laboratory) are listed below for reference.   Imaging Studies: CT HEAD WO CONTRAST ( ) Result Date: 10/12/2024 EXAM: CT HEAD WITHOUT CONTRAST 10/12/2024 05:57:18 PM TECHNIQUE: CT of the head was performed without the administration of intravenous contrast. Automated exposure control, iterative reconstruction, and/or weight based adjustment of the mA/kV was utilized to reduce the radiation dose to as low as reasonably achievable. COMPARISON: Comparison made with prior CT from 07/04/2024 as well as earlier studies. CLINICAL HISTORY: Mental status change, unknown cause. FINDINGS:  BRAIN AND VENTRICLES: Patchy hyperdensity involving the supratentorial cerebral white matter consistent with chronic 2-vessel ischemic disease, stable. Few small remote cerebellar infarcts, right greater than left, also stable. No acute intracranial hemorrhage. No visible acute large vessel territory infarct. No hydrocephalus. No extra-axial collection. No mass effect or midline shift. 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 No abnormal hyperdense vessel. Calcified atherosclerosis present about the skull base. ORBITS: No acute abnormality. SINUSES: No acute abnormality. SOFT TISSUES AND SKULL: No acute soft tissue abnormality. No skull fracture. IMPRESSION: 1. No acute intracranial abnormality. 2. 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 3. Chronic microvascular ischemic disease with a few small remote cerebellar infarcts, stable. Electronically signed by: Morene Hoard MD 10/12/2024 06:33 PM EDT RP Workstation: [id number]   CT Chest Wo Contrast Result Date: 10/11/2024 EXAM: CT CHEST WITHOUT CONTRAST 10/11/2024 01:05:02 PM TECHNIQUE: CT of the chest was performed without the administration of intravenous contrast. Multiplanar reformatted images are provided for review. Automated  exposure control, iterative reconstruction, and/or weight based adjustment of the mA/kV was utilized to reduce the radiation dose to as low as reasonably achievable. COMPARISON: 05/22/2024 CLINICAL HISTORY: pna type symptoms. FINDINGS: MEDIASTINUM: Heart and pericardium are unremarkable. The central airways are clear. Aortic atherosclerosis. LYMPH NODES: No mediastinal, hilar or axillary lymphadenopathy. LUNGS AND PLEURA: Minimal right pleural effusion is noted. Mild left posterior basilar opacity is noted concerning for subsegmental atelectasis or possibly pneumonia. No  pneumothorax. SOFT TISSUES/BONES: Moderate compression deformity of T12 vertebral body is noted concerning for fracture of indeterminate age. No acute abnormality of the soft tissues. UPPER ABDOMEN: Limited images of the upper abdomen demonstrates no acute abnormality. IMPRESSION: 1. Mild left posterior basilar opacity, suspicious for subsegmental atelectasis or pneumonia. 2. Minimal right pleural effusion. 3. Moderate T12 vertebral body compression deformity, age indeterminate. Electronically signed by: Lynwood Seip MD 10/11/2024 02:30 PM EDT RP Workstation: [id number]   DG Chest 2 View Result Date: 10/11/2024 EXAM: 2 VIEW(S) XRAY OF THE CHEST 10/11/2024 11:11:51 AM COMPARISON: 05/29/2024 CLINICAL HISTORY: cough. Per Triage: Pt arrived [facility] from [facility] in [place]. Pt has had coughing, nasal congestion and fatigue since yesterday. FINDINGS: LUNGS AND PLEURA: Minimal bibasilar subsegmental atelectasis. Minimal bilateral pleural effusions are noted. No pulmonary edema. No pneumothorax. HEART AND MEDIASTINUM: No acute abnormality of the cardiac and mediastinal silhouettes. BONES AND SOFT TISSUES: No acute osseous abnormality. IMPRESSION: 1. Minimal bilateral pleural effusions. 2. Minimal bibasilar subsegmental atelectasis. Electronically signed by: Lynwood Seip MD 10/11/2024 11:27 AM EDT RP Workstation: [id number]   MM 3D SCREENING MAMMOGRAM BILATERAL BREAST Result Date: 10/11/2024 CLINICAL DATA:  Screening. EXAM: DIGITAL SCREENING BILATERAL MAMMOGRAM WITH TOMOSYNTHESIS AND CAD TECHNIQUE: Bilateral screening digital craniocaudal and mediolateral oblique mammograms were obtained. Bilateral screening digital breast tomosynthesis was performed. The images were evaluated with computer-aided detection. COMPARISON:  Previous exam(s). ACR Breast Density Category b: There are scattered areas of fibroglandular density. FINDINGS: There are no findings suspicious for malignancy. IMPRESSION: No mammographic evidence of  malignancy. A result letter of this screening mammogram will be mailed directly to the patient. RECOMMENDATION: Screening mammogram in one year. (Code:SM-B-01Y) BI-RADS CATEGORY  1: Negative. Electronically Signed   By: Alm Parkins M.D.   On: 10/11/2024 09:46   US  PELVIC COMPLETE WITH TRANSVAGINAL Result Date: 10/01/2024 Vaginal cuff WNL Neither ovary seen No adnexal masses No free fluid History of TAH    Microbiology: Results for orders placed or performed during the hospital encounter of 10/11/24  Resp panel by RT-PCR (RSV, Flu A&B, Covid) Anterior Nasal Swab     Status: None   Collection Time: 10/11/24 10:48 AM   Specimen: Anterior Nasal Swab  Result Value Ref Range Status   SARS Coronavirus 2 by RT PCR NEGATIVE NEGATIVE Final    Comment: (NOTE) SARS-CoV-2 target nucleic acids are NOT DETECTED.  The SARS-CoV-2 RNA is generally detectable in upper respiratory specimens during the acute phase of infection. The lowest concentration of SARS-CoV-2 viral copies this assay can detect is 138 copies/mL. A negative result does not preclude SARS-Cov-2 infection and should not be used as the sole basis for treatment or other patient management decisions. A negative result may occur with  improper specimen collection/handling, submission of specimen other than nasopharyngeal swab, presence of viral mutation(s) within the areas targeted by this assay, and inadequate number of viral copies(<138 copies/mL). A negative result must be combined with clinical observations, patient history, and epidemiological information. The expected result is Negative.  Fact Sheet for Patients:  BloggerCourse.com  Fact  Sheet for Healthcare Providers:  SeriousBroker.it  This test is no t yet approved or cleared by the [place]  FDA and  has been authorized for detection and/or diagnosis of SARS-CoV-2 by FDA under an Emergency Use Authorization (EUA). This  EUA will remain  in effect (meaning this test can be used) for the duration of the COVID-19 declaration under Section 564(b)(1) of the Act, 21 U.S.C.section 360bbb-3(b)(1), unless the authorization is terminated  or revoked sooner.       Influenza A by PCR NEGATIVE NEGATIVE Final   Influenza B by PCR NEGATIVE NEGATIVE Final    Comment: (NOTE) The Xpert Xpress SARS-CoV-2/FLU/RSV plus assay is intended as an aid in the diagnosis of influenza from Nasopharyngeal swab specimens and should not be used as a sole basis for treatment. Nasal washings and aspirates are unacceptable for Xpert Xpress SARS-CoV-2/FLU/RSV testing.  Fact Sheet for Patients: BloggerCourse.com  Fact Sheet for Healthcare Providers: SeriousBroker.it  This test is not yet approved or cleared by the [place]  FDA and has been authorized for detection and/or diagnosis of SARS-CoV-2 by FDA under an Emergency Use Authorization (EUA). This EUA will remain in effect (meaning this test can be used) for the duration of the COVID-19 declaration under Section 564(b)(1) of the Act, 21 U.S.C. section 360bbb-3(b)(1), unless the authorization is terminated or revoked.     Resp Syncytial Virus by PCR NEGATIVE NEGATIVE Final    Comment: (NOTE) Fact Sheet for Patients: BloggerCourse.com  Fact Sheet for Healthcare Providers: SeriousBroker.it  This test is not yet approved or cleared by the [place]  FDA and has been authorized for detection and/or diagnosis of SARS-CoV-2 by FDA under an Emergency Use Authorization (EUA). This EUA will remain in effect (meaning this test can be used) for the duration of the COVID-19 declaration under Section 564(b)(1) of the Act, 21 U.S.C. section 360bbb-3(b)(1), unless the authorization is terminated or revoked.  Performed at [facility], [address], [place], [place] [address]    Blood culture (routine x 2)     Status: None   Collection Time: 10/11/24  3:25 PM   Specimen: BLOOD RIGHT ARM  Result Value Ref Range Status   Specimen Description BLOOD RIGHT ARM  Final   Special Requests   Final    BOTTLES DRAWN AEROBIC AND ANAEROBIC Blood Culture adequate volume   Culture   Final    NO GROWTH 5 DAYS Performed at [facility], [address], [place], [place] [address]    Report Status 10/16/2024 FINAL  Final  Blood culture (routine x 2)     Status: None   Collection Time: 10/11/24  3:25 PM   Specimen: BLOOD LEFT ARM  Result Value Ref Range Status   Specimen Description BLOOD LEFT ARM  Final   Special Requests   Final    BOTTLES DRAWN AEROBIC AND ANAEROBIC Blood Culture adequate volume   Culture   Final    NO GROWTH 5 DAYS Performed at [facility], [address], [place], [place] [address]    Report Status 10/16/2024 FINAL  Final    Labs: CBC: Recent Labs  Lab 10/11/24 1121 10/12/24 0435 10/15/24 0414  WBC 10.0 7.2 8.2  NEUTROABS 6.6  --   --   HGB 12.2 12.3 9.6*  HCT 38.1 37.8 30.2*  MCV 89.6 86.7 89.3  PLT 236 257 248   Basic Metabolic Panel: Recent Labs  Lab 10/11/24 1121 10/12/24 0435 10/15/24 0414  NA 134* 138 144  K 4.3 4.3 3.4*  CL 100 103 109  CO2 21* 26 26  GLUCOSE 119* 210* 107*  BUN 8 8 13   CREATININE 0.55 0.52 0.50  CALCIUM  9.1 9.3 8.3*  MG  --   --  1.6*  PHOS  --   --  2.7   Liver Function Tests: Recent Labs  Lab 10/11/24 1121 10/15/24 0414  AST 33 55*  ALT 13 37  ALKPHOS 40 31*  BILITOT 0.8 0.3  PROT 6.6 5.7*  ALBUMIN 3.6 3.3*   CBG: Recent Labs  Lab 10/15/24 1110 10/15/24 1642 10/15/24 2124 10/16/24 0745 10/16/24 1132  GLUCAP 148* 156* 201* 82 135*    Discharge time spent:  35 minutes.  Signed: Eric Nunnery, MD [facility] 10/16/2024

## 2024-10-16 NOTE — [place] FL2 (Signed)
 [place]  MEDICAID FL2 LEVEL OF CARE FORM     IDENTIFICATION  Patient Name: [name] White Birthdate: 10-19-1941 Sex: female Admission Date (Current Location): 10/11/2024  [facility] and [place] Number:  [employer] and Address:  [facility],  [address] [place] [address]      Provider Number: [id number]  Attending Physician Name and Address:  Ricky Fines, MD  Relative Name and Phone Number:       Current Level of Care: Hospital Recommended Level of Care: Assisted Living Facility Prior Approval Number:    Date Approved/Denied:   PASRR Number:    Discharge Plan: Other (Comment) (ALF)    Current Diagnoses: Patient Active Problem List   Diagnosis Date Noted   CAP (community acquired pneumonia) 10/11/2024   Abnormal gait 07/10/2024   Cervical radiculopathy 07/10/2024   Chronic neck pain 07/10/2024   Visual disturbance 07/10/2024   Recurrent UTI 07/09/2024   Hypomagnesemia 05/01/2024   Acute respiratory failure with hypoxemia (HCC) 04/24/2024   CVA (cerebral vascular accident) (HCC) 03/12/2024   Anxiety and depression 03/12/2024   GERD without esophagitis 03/12/2024   Rhabdomyolysis 12/27/2023   Pressure injury of skin 12/27/2023   Status post transsphenoidal pituitary resection 06/04/2020   Pituitary adenoma with extrasellar extension (HCC) 06/04/2020   Pituitary tumor 05/29/2020   Dizzinesses 02/02/2020   Vitamin D  deficiency 01/28/2020   Paroxysmal supraventricular tachycardia 06/19/2014   Rectocele 03/19/2013   Constipation 03/19/2013   HYPERCHOLESTEROLEMIA 02/09/2009   Essential hypertension 02/09/2009   Osteoporosis 02/09/2009   Post-menopausal 02/09/2009   HEAT INTOLERANCE 02/01/2008   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   Hypothyroidism 09/14/2007   Migraine headache 09/14/2007   SUPERFICIAL PHLEBITIS 09/14/2007   MENOPAUSAL SYNDROME 09/14/2007   FATIGUE 09/14/2007   Type 2 diabetes mellitus with peripheral neuropathy (HCC)  09/14/2007    Orientation RESPIRATION BLADDER Height & Weight     Self, Time, Place, Situation  Normal Incontinent Weight: 130 lb (59 kg) Height:  5' 7 (170.2 cm)  BEHAVIORAL SYMPTOMS/MOOD NEUROLOGICAL BOWEL NUTRITION STATUS      Continent Diet (Regular)  AMBULATORY STATUS COMMUNICATION OF NEEDS Skin   Limited Assist Verbally Normal                       Personal Care Assistance Level of Assistance  Bathing, Feeding, Dressing Bathing Assistance: Limited assistance Feeding assistance: Independent Dressing Assistance: Limited assistance     Functional Limitations Info  Sight, Hearing, Speech Sight Info: Impaired Hearing Info: Adequate Speech Info: Adequate    SPECIAL CARE FACTORS FREQUENCY                       Contractures Contractures Info: Not present    Additional Factors Info  Code Status, Allergies Code Status Info: DNR0-Limited Allergies Info: Betadine (Povidone Iodine), Penicillins           Current Medications (10/16/2024):  This is the current hospital active medication list Current Facility-Administered Medications  Medication Dose Route Frequency Provider Last Rate Last Admin   acetaminophen  (TYLENOL ) tablet 650 mg  650 mg Oral Q6H PRN Stinson, Jacob J, DO   650 mg at 10/16/24 1021   Or   acetaminophen  (TYLENOL ) suppository 650 mg  650 mg Rectal Q6H PRN Stinson, Jacob J, DO       albuterol  (PROVENTIL ) (2.5 MG/3ML) 0.083% nebulizer solution 2.5 mg  2.5 mg Nebulization Q4H PRN Pearlean, Courage, MD   2.5 mg at 10/16/24 0530  aspirin  EC tablet 81 mg  81 mg Oral Daily Stinson, Jacob J, DO   81 mg at 10/16/24 1023   atorvastatin  (LIPITOR) tablet 80 mg  80 mg Oral Daily Gherghe, Costin M, MD   80 mg at 10/16/24 1023   azithromycin  (ZITHROMAX ) 500 mg in sodium chloride  0.9 % 250 mL IVPB  500 mg Intravenous Q24H Stinson, Jacob J, DO   Stopped at 10/15/24 1830   cefTRIAXone  (ROCEPHIN ) 2 g in sodium chloride  0.9 % 100 mL IVPB  2 g Intravenous Q24H  Stinson, Jacob J, DO   Stopped at 10/15/24 1659   enoxaparin  (LOVENOX ) injection 40 mg  40 mg Subcutaneous Q24H Stinson, Jacob J, DO   40 mg at 10/15/24 2127   feeding supplement (ENSURE PLUS HIGH PROTEIN) liquid 237 mL  237 mL Oral BID BM Gherghe, Costin M, MD   237 mL at 10/16/24 1039   fluticasone  furoate-vilanterol (BREO ELLIPTA ) 200-25 MCG/ACT 1 puff  1 puff Inhalation Daily Stinson, Jacob J, DO   1 puff at 10/16/24 9160   guaiFENesin  (MUCINEX ) 12 hr tablet 600 mg  600 mg Oral BID Stinson, Jacob J, DO   600 mg at 10/16/24 1023   insulin  aspart (novoLOG ) injection 0-5 Units  0-5 Units Subcutaneous QHS Ghimire, Kuber, MD   2 Units at 10/15/24 2127   insulin  aspart (novoLOG ) injection 0-9 Units  0-9 Units Subcutaneous TID WC Ghimire, Kuber, MD   2 Units at 10/15/24 1731   ipratropium-albuterol  (DUONEB) 0.5-2.5 (3) MG/3ML nebulizer solution 3 mL  3 mL Nebulization TID Pearlean Manus, MD   3 mL at 10/16/24 0839   levothyroxine  (SYNTHROID ) tablet 50 mcg  50 mcg Oral Q0600 Stinson, Jacob J, DO   50 mcg at 10/16/24 9482   magnesium  oxide (MAG-OX) tablet 400 mg  400 mg Oral BID Ghimire, Kuber, MD   400 mg at 10/16/24 1023   ondansetron  (ZOFRAN ) tablet 4 mg  4 mg Oral Q6H PRN Stinson, Jacob J, DO       Or   ondansetron  (ZOFRAN ) injection 4 mg  4 mg Intravenous Q6H PRN Stinson, Jacob J, DO   4 mg at 10/14/24 8176   pantoprazole  (PROTONIX ) EC tablet 40 mg  40 mg Oral Daily Stinson, Jacob J, DO   40 mg at 10/16/24 1023   polyethylene glycol (MIRALAX  / GLYCOLAX ) packet 17 g  17 g Oral Daily PRN Stinson, Jacob J, DO       predniSONE  (DELTASONE ) tablet 20 mg  20 mg Oral Q breakfast Ghimire, Kuber, MD   20 mg at 10/16/24 1023   Followed by   NOREEN ON 10/18/2024] predniSONE  (DELTASONE ) tablet 10 mg  10 mg Oral Q breakfast Raenelle Coria, MD       Followed by   NOREEN ON 10/20/2024] predniSONE  (DELTASONE ) tablet 5 mg  5 mg Oral Q breakfast Ghimire, Kuber, MD       pregabalin  (LYRICA ) capsule 75 mg  75 mg  Oral BID Emokpae, Courage, MD   75 mg at 10/16/24 1023   traZODone  (DESYREL ) tablet 50 mg  50 mg Oral QHS PRN Stinson, Jacob J, DO   50 mg at 10/15/24 2132   umeclidinium bromide  (INCRUSE ELLIPTA ) 62.5 MCG/ACT 1 puff  1 puff Inhalation Daily Stinson, Jacob J, DO   1 puff at 10/16/24 9160   venlafaxine  XR (EFFEXOR -XR) 24 hr capsule 150 mg  150 mg Oral Q breakfast Stinson, Jacob J, DO   150 mg at 10/16/24 1023   vortioxetine   HBr (TRINTELLIX ) tablet 10 mg  10 mg Oral Daily Stinson, Jacob J, DO   10 mg at 10/16/24 1023     Discharge Medications: Allergies as of 10/16/2024       Reactions   Betadine [povidone Iodine] Hives   Penicillins Rash        Medication List     TAKE these medications    acetaminophen  325 MG tablet Commonly known as: TYLENOL  Take 2 tablets (650 mg total) by mouth every 6 (six) hours as needed for mild pain (pain score 1-3) (or Fever >/= 101).   albuterol  108 (90 Base) MCG/ACT inhaler Commonly known as: VENTOLIN  HFA Inhale 2 puffs into the lungs every 4 (four) hours as needed for wheezing or shortness of breath.   aspirin  EC 81 MG tablet Take 81 mg by mouth daily. Swallow whole.   atorvastatin  40 MG tablet Commonly known as: LIPITOR TAKE (2) TABLETS (80 MG) BY MOUTH DAILY. What changed: See the new instructions.   cefdinir  300 MG capsule Commonly known as: OMNICEF  Take 1 capsule (300 mg total) by mouth 2 (two) times daily for 4 days.   cyanocobalamin  1000 MCG/ML injection Commonly known as: VITAMIN B12 Inject 1,000 mcg into the muscle every 30 (thirty) days.   cycloSPORINE  0.05 % ophthalmic emulsion Commonly known as: RESTASIS  Place 1 drop into both eyes 2 (two) times daily.   dextromethorphan -guaiFENesin  30-600 MG 12hr tablet Commonly known as: MUCINEX  DM Take 1 tablet by mouth 2 (two) times daily for 5 days.   dimethicone 1 % cream Apply 1 Application topically 2 (two) times daily as needed for dry skin (pressure injury on sacrum).    doxycycline 100 MG tablet Commonly known as: VIBRA-TABS Take 1 tablet (100 mg total) by mouth 2 (two) times daily for 4 days.   fluticasone  furoate-vilanterol 200-25 MCG/ACT Aepb Commonly known as: Breo Ellipta  Inhale 1 puff into the lungs daily.   furosemide  20 MG tablet Commonly known as: Lasix  Take 1 tablet (20 mg total) by mouth daily as needed for edema or fluid. What changed:  when to take this reasons to take this   Imvexxy  Maintenance Pack 10 MCG Inst Generic drug: Estradiol  Place 1 suppository vaginally 2 (two) times a week.   ipratropium-albuterol  0.5-2.5 (3) MG/3ML Soln Commonly known as: DUONEB Take 3 mLs by nebulization every 4 (four) hours as needed (wheezing and SOB). What changed: See the new instructions.   levothyroxine  50 MCG tablet Commonly known as: SYNTHROID  Take 50 mcg by mouth daily.   metFORMIN  1000 MG tablet Commonly known as: GLUCOPHAGE  Take 1,000 mg by mouth 2 (two) times daily.   midodrine  5 MG tablet Commonly known as: PROAMATINE  Take 5 mg by mouth 3 (three) times daily with meals.   nitrofurantoin  100 MG capsule Commonly known as: Macrodantin  Take 1 capsule (100 mg total) by mouth daily. Start taking on: October 21, 2024 What changed: These instructions start on October 21, 2024. If you are unsure what to do until then, ask your doctor or other care provider.   NONFORMULARY OR COMPOUNDED ITEM If patient is incontinent of urine and/or stool, SNF staff are instructed to cleanse patient's genital area at least once every 8 hours and change diaper if soiled.   NONFORMULARY OR COMPOUNDED ITEM SNF staff are instructed to encourage hydration with water  at least once every 2-4 hours while patient is awake.   NONFORMULARY OR COMPOUNDED ITEM SNF staff are instructed to prompt toileting and assist patient with ambulation to toilet  at least once every 4-6 hours while awake.   omeprazole 20 MG capsule Commonly known as: PRILOSEC Take 1 capsule  (20 mg total) by mouth 2 (two) times daily before a meal. What changed: when to take this   polyethylene glycol 17 g packet Commonly known as: MIRALAX  / GLYCOLAX  Take 17 g by mouth daily as needed for mild constipation.   potassium chloride  10 MEQ tablet Commonly known as: KLOR-CON  Take 10 mEq by mouth daily.   predniSONE  20 MG tablet Commonly known as: DELTASONE  Take 2 tablets by mouth daily X 2 days; then 1 tablet by mouth daily X 3 days, then 1/2 tablet by mouth daily X 4 days and then start prednisone  5mg  daily (1 week).   predniSONE  5 MG tablet Commonly known as: DELTASONE  Take 1 tablet (5 mg total) by mouth daily with breakfast. Start taking on: October 24, 2024   pregabalin  150 MG capsule Commonly known as: LYRICA  Take 1 capsule (150 mg total) by mouth 2 (two) times daily.   rizatriptan  10 MG disintegrating tablet Commonly known as: MAXALT -MLT Take 10 mg by mouth as needed for migraine.   Thera-M Tabs Take 1 tablet by mouth daily.   tiZANidine 2 MG tablet Commonly known as: ZANAFLEX Take 2 mg by mouth 2 (two) times daily as needed for muscle spasms.   traZODone  50 MG tablet Commonly known as: DESYREL  Take 1 tablet (50 mg total) by mouth at bedtime as needed for sleep.   Trintellix  10 MG Tabs tablet Generic drug: vortioxetine  HBr Take 10 mg by mouth daily.   umeclidinium bromide  62.5 MCG/ACT Aepb Commonly known as: INCRUSE ELLIPTA  Inhale 1 puff into the lungs daily.   venlafaxine  XR 150 MG 24 hr capsule Commonly known as: EFFEXOR -XR Take 150 mg by mouth every morning.         Relevant Imaging Results:  Relevant Lab Results:   Additional Information SSN: [id number], [place]

## 2024-10-21 ENCOUNTER — Other Ambulatory Visit (HOSPITAL_COMMUNITY): Payer: Self-pay | Admitting: Internal Medicine

## 2024-10-21 ENCOUNTER — Telehealth: Payer: Self-pay | Admitting: *Deleted

## 2024-10-21 DIAGNOSIS — E1165 Type 2 diabetes mellitus with hyperglycemia: Secondary | ICD-10-CM | POA: Diagnosis not present

## 2024-10-21 DIAGNOSIS — R7989 Other specified abnormal findings of blood chemistry: Secondary | ICD-10-CM | POA: Diagnosis not present

## 2024-10-21 DIAGNOSIS — Z23 Encounter for immunization: Secondary | ICD-10-CM | POA: Diagnosis not present

## 2024-10-21 DIAGNOSIS — N390 Urinary tract infection, site not specified: Secondary | ICD-10-CM | POA: Diagnosis not present

## 2024-10-21 DIAGNOSIS — I10 Essential (primary) hypertension: Secondary | ICD-10-CM | POA: Diagnosis not present

## 2024-10-21 DIAGNOSIS — J4540 Moderate persistent asthma, uncomplicated: Secondary | ICD-10-CM | POA: Diagnosis not present

## 2024-10-21 DIAGNOSIS — E611 Iron deficiency: Secondary | ICD-10-CM | POA: Diagnosis not present

## 2024-10-21 DIAGNOSIS — E039 Hypothyroidism, unspecified: Secondary | ICD-10-CM | POA: Diagnosis not present

## 2024-10-21 DIAGNOSIS — J189 Pneumonia, unspecified organism: Secondary | ICD-10-CM

## 2024-10-21 DIAGNOSIS — K219 Gastro-esophageal reflux disease without esophagitis: Secondary | ICD-10-CM | POA: Diagnosis not present

## 2024-10-21 NOTE — Telephone Encounter (Signed)
 Alexander from [facility] left message requesting to f/u on Cologuard order ID# [id number].   Return call to [phone]  Per review of EPIC, cologuard ordered 10/01/24 with Dx of PMB, N95.0.   Dr. Glennon -please advise if you want to update to screening diagnosis codes of Z12.11 and Z12.12

## 2024-10-21 NOTE — Telephone Encounter (Signed)
 Call returned to Cologuard, spoke with Burnard, order updated to ICD Z12.11 and Z12.12.   Encounter closed.

## 2024-10-22 ENCOUNTER — Other Ambulatory Visit: Payer: Self-pay | Admitting: Neurology

## 2024-10-22 NOTE — Telephone Encounter (Signed)
 Order was faxed back on 10/14/24

## 2024-10-25 ENCOUNTER — Ambulatory Visit
Admission: RE | Admit: 2024-10-25 | Discharge: 2024-10-25 | Disposition: A | Discharge status: Home / Self Care | Payer: Medicare Other | Source: Ambulatory Visit | Attending: Internal Medicine | Admitting: Internal Medicine

## 2024-10-25 DIAGNOSIS — D352 Benign neoplasm of pituitary gland: Secondary | ICD-10-CM

## 2024-10-25 MED ORDER — GADOPICLENOL 0.5 MMOL/ML IV SOLN
6.0000 mL | Freq: Once | INTRAVENOUS | Status: AC | PRN
  Administered 2024-10-25: 6 mL via INTRAVENOUS

## 2024-10-28 ENCOUNTER — Inpatient Hospital Stay: Payer: Medicare Other | Attending: Internal Medicine | Admitting: Internal Medicine

## 2024-10-28 ENCOUNTER — Other Ambulatory Visit: Payer: Self-pay | Admitting: Internal Medicine

## 2024-10-28 VITALS — BP 108/62 | HR 94 | Temp 97.5°F | Resp 18 | Ht 67.0 in | Wt 130.7 lb

## 2024-10-28 DIAGNOSIS — D497 Neoplasm of unspecified behavior of endocrine glands and other parts of nervous system: Secondary | ICD-10-CM

## 2024-10-28 NOTE — Progress Notes (Signed)
 [facility] at [facility] [address]  [place], [place] [address] [phone]   Interval Evaluation  Date of Service: 10/28/24 Patient Name: [name] White Patient MRN: [id number] Patient DOB: 1941/07/12 Provider: Arthea [name] Manns, MD  Identifying Statement:  [name] White is a 83 y.o. female with pituitary adenoma   Oncologic History: Xx/xx/99BETHA White total resection  06/04/20: Debulking transphenoidal resection of recurrent adenoma with Dr. Colon and Dr. Cheryle 10/30/20: Completes 6 weeks IMRT to residual adenoma with Dr. Izell   Interval History: [name] White presents today for follow up after recent MRI brain.  No new complaints or neurologic deficits today.  She had been admitted for pneumonia last month, treated with antibiotics and oxygen.  Also had small stroke in March, following with vascular neurology.  No headaches or seizures.  Maintains functional independence.  Still living with her daughter and small dog.  H+P (05/04/21) Patient presents to clinic today following her 3 months post-IMRT MRI scan.  She describes no recurrence in tunnel vision symptoms which had affected her for the 6 months prior to her fall and repeat surgery.  She does experience some double vision, with objects on top of each other worse when looking to the right.  This is also not a new complaint.  Otherwise, she complains of blurriness that impairs her ability to read at times, not in one particular eye or visual field.  Is followed by ophthalmology.  Denies non-visual neurologic symptoms.  Lives at home with her daughter, is functional and independent.  Medications: Current Outpatient Medications on File Prior to Visit  Medication Sig Dispense Refill   acetaminophen  (TYLENOL ) 325 MG tablet Take 2 tablets (650 mg total) by mouth every 6 (six) hours as needed for mild pain (pain score 1-3) (or Fever >/= 101). 30 tablet 0   albuterol  (VENTOLIN  HFA) 108 (90 Base)  MCG/ACT inhaler Inhale 2 puffs into the lungs every 4 (four) hours as needed for wheezing or shortness of breath. 8 g 3   aspirin  EC 81 MG tablet Take 81 mg by mouth daily. Swallow whole.     atorvastatin  (LIPITOR) 80 MG tablet Take 1 tablet (80 mg total) by mouth daily. 90 tablet 1   cyanocobalamin  (VITAMIN B12) 1000 MCG/ML injection Inject 1,000 mcg into the muscle every 30 (thirty) days.     cycloSPORINE  (RESTASIS ) 0.05 % ophthalmic emulsion Place 1 drop into both eyes 2 (two) times daily.     dimethicone 1 % cream Apply 1 Application topically 2 (two) times daily as needed for dry skin (pressure injury on sacrum).     Estradiol  (IMVEXXY  MAINTENANCE PACK) 10 MCG INST Place 1 suppository vaginally 2 (two) times a week. 8 each 6   fluticasone  furoate-vilanterol (BREO ELLIPTA ) 200-25 MCG/ACT AEPB Inhale 1 puff into the lungs daily. 30 each 1   furosemide  (LASIX ) 20 MG tablet Take 1 tablet (20 mg total) by mouth daily as needed for edema or fluid.     ipratropium-albuterol  (DUONEB) 0.5-2.5 (3) MG/3ML SOLN Take 3 mLs by nebulization every 4 (four) hours as needed (wheezing and SOB). 360 mL 2   levothyroxine  (SYNTHROID ) 50 MCG tablet Take 50 mcg by mouth daily.     metFORMIN  (GLUCOPHAGE ) 1000 MG tablet Take 1,000 mg by mouth 2 (two) times daily.     midodrine  (PROAMATINE ) 5 MG tablet Take 5 mg by mouth 3 (three) times daily with meals.     Multiple Vitamins-Minerals (THERA-M) TABS Take 1  tablet by mouth daily.     nitrofurantoin  (MACRODANTIN ) 100 MG capsule Take 1 capsule (100 mg total) by mouth daily.     NONFORMULARY OR COMPOUNDED ITEM If patient is incontinent of urine and/or stool, SNF staff are instructed to cleanse patient's genital area at least once every 8 hours and change diaper if soiled. 1 each 99   NONFORMULARY OR COMPOUNDED ITEM SNF staff are instructed to encourage hydration with water  at least once every 2-4 hours while patient is awake. 1 each 99   NONFORMULARY OR COMPOUNDED ITEM SNF  staff are instructed to prompt toileting and assist patient with ambulation to toilet at least once every 4-6 hours while awake. 1 each 99   omeprazole (PRILOSEC) 20 MG capsule Take 1 capsule (20 mg total) by mouth 2 (two) times daily before a meal. 60 capsule 2   polyethylene glycol (MIRALAX  / GLYCOLAX ) 17 g packet Take 17 g by mouth daily as needed for mild constipation. 30 each 0   potassium chloride  (KLOR-CON ) 10 MEQ tablet Take 10 mEq by mouth daily.     predniSONE  (DELTASONE ) 20 MG tablet Take 2 tablets by mouth daily X 2 days; then 1 tablet by mouth daily X 3 days, then 1/2 tablet by mouth daily X 4 days and then start prednisone  5mg  daily (1 week). 10 tablet 0   predniSONE  (DELTASONE ) 5 MG tablet Take 1 tablet (5 mg total) by mouth daily with breakfast. 30 tablet 1   pregabalin  (LYRICA ) 150 MG capsule Take 1 capsule (150 mg total) by mouth 2 (two) times daily. 60 capsule 0   rizatriptan  (MAXALT -MLT) 10 MG disintegrating tablet Take 10 mg by mouth as needed for migraine.     tiZANidine (ZANAFLEX) 2 MG tablet Take 2 mg by mouth 2 (two) times daily as needed for muscle spasms.     traZODone  (DESYREL ) 50 MG tablet Take 1 tablet (50 mg total) by mouth at bedtime as needed for sleep. 30 tablet 0   TRINTELLIX  10 MG TABS tablet Take 10 mg by mouth daily.     umeclidinium bromide  (INCRUSE ELLIPTA ) 62.5 MCG/ACT AEPB Inhale 1 puff into the lungs daily. 1 each 1   venlafaxine  (EFFEXOR -XR) 150 MG 24 hr capsule Take 150 mg by mouth every morning.     No current facility-administered medications on file prior to visit.    Allergies:  Allergies  Allergen Reactions   Betadine [Povidone Iodine] Hives   Penicillins Rash   Past Medical History:  Past Medical History:  Diagnosis Date   Anemia    Anxiety    Asthma    ASYMPTOMATIC POSTMENOPAUSAL STATUS 02/09/2009   Cataract    Cerebral infarction (HCC)    Constipation 03/19/2013   Depression    DIABETES MELLITUS, TYPE II 09/14/2007    HYPERCHOLESTEROLEMIA 02/09/2009   HYPERTENSION 02/09/2009   HYPOTHYROIDISM 09/14/2007   Metabolic encephalopathy    MIGRAINE HEADACHE 09/14/2007   Neuropathy    OSTEOPOROSIS 02/09/2009   PANCREATITIS 09/14/2007   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   PONV (postoperative nausea and vomiting)    Protein calorie malnutrition    Rectocele 03/19/2013   Shortness of breath    SUPERFICIAL PHLEBITIS 09/14/2007   Varicose veins    Past Surgical History:  Past Surgical History:  Procedure Laterality Date   ABDOMINAL HYSTERECTOMY     ANTERIOR AND POSTERIOR REPAIR N/A 04/02/2013   Procedure: ANTERIOR (CYSTOCELE) AND POSTERIOR REPAIR (RECTOCELE);  Surgeon: Norleen LULLA Server, MD;  Location: [facility];  Service: Gynecology;  Laterality: N/A;   APPENDECTOMY     BRAIN SURGERY     CATARACT EXTRACTION W/PHACO  12/05/2011   Procedure: CATARACT EXTRACTION PHACO AND INTRAOCULAR LENS PLACEMENT (IOC);  Surgeon: Cherene Mania;  Location: [facility];  Service: Ophthalmology;  Laterality: Left;  CDE:9.65   CHOLECYSTECTOMY     CRANIOTOMY N/A 06/04/2020   Procedure: Endoscopic Transphenoidal Resection of Recurrent PituitaryTumor;  Surgeon: Colon Shove, MD;  Location: [facility];  Service: Neurosurgery;  Laterality: N/A;   CYSTOSCOPY     ENDOVENOUS ABLATION SAPHENOUS VEIN W/ LASER  11-03-2011   right greater saphenous vein   left leg done 10-2011   EYE SURGERY  98   right cataract extraction 98   LAPAROSCOPIC NISSEN FUNDOPLICATION     NM ESOPHAGEAL REFLUX  08-11-11   PITUITARY EXCISION  10/1997   POSTERIOR REPAIR     TRANSNASAL APPROACH N/A 06/04/2020   Procedure: TRANSNASAL APPROACH;  Surgeon: Mable Lenis, MD;  Location: [facility];  Service: ENT;  Laterality: N/A;   TRANSPHENOIDAL / TRANSNASAL HYPOPHYSECTOMY / RESECTION PITUITARY TUMOR  08-11-11   Social History:  Social History   Socioeconomic History   Marital status: Married    Spouse name: Not on file   Number of children: Not on file   Years of education: Not on  file   Highest education level: Not on file  Occupational History   Occupation: Retired    Associate Professor: RETIRED  Tobacco Use   Smoking status: Never   Smokeless tobacco: Never  Vaping Use   Vaping status: Never Used  Substance and Sexual Activity   Alcohol use: No    Alcohol/week: 0.0 standard drinks of alcohol   Drug use: No   Sexual activity: Not Currently    Birth control/protection: Surgical  Other Topics Concern   Not on file  Social History Narrative   Not on file   Social Drivers of Health   Financial Resource Strain: Not on file  Food Insecurity: No Food Insecurity (10/11/2024)   Hunger Vital Sign    Worried About Running Out of Food in the Last Year: Never true    Ran Out of Food in the Last Year: Never true  Transportation Needs: No Transportation Needs (10/11/2024)   PRAPARE - Administrator, Civil Service (Medical): No    Lack of Transportation (Non-Medical): No  Physical Activity: Not on file  Stress: Not on file  Social Connections: Socially Isolated (10/11/2024)   Social Connection and Isolation Panel    Frequency of Communication with Friends and Family: More than three times a week    Frequency of Social Gatherings with Friends and Family: Twice a week    Attends Religious Services: Never    Database Administrator or Organizations: No    Attends Banker Meetings: Never    Marital Status: Widowed  Intimate Partner Violence: Not At Risk (10/11/2024)   Humiliation, Afraid, Rape, and Kick questionnaire    Fear of Current or Ex-Partner: No    Emotionally Abused: No    Physically Abused: No    Sexually Abused: No   Family History:  Family History  Problem Relation Age of Onset   Heart disease Mother    Asthma Mother    COPD Father    Heart disease Father    Stroke Father    Asthma Daughter    Migraines Daughter    Neuropathy Son    Cancer Neg Hx    Anesthesia problems Neg Hx  Hypotension Neg Hx    Malignant hyperthermia  Neg Hx    Pseudochol deficiency Neg Hx     Review of Systems: Constitutional: Doesn't report fevers, chills or abnormal weight loss Eyes: Doesn't report blurriness of vision Ears, nose, mouth, throat, and face: Doesn't report sore throat Respiratory: Doesn't report cough, dyspnea or wheezes Cardiovascular: Doesn't report palpitation, chest discomfort  Gastrointestinal:  Doesn't report nausea, constipation, diarrhea GU: Doesn't report incontinence Skin: Doesn't report skin rashes Neurological: Per HPI Musculoskeletal: Doesn't report joint pain Behavioral/Psych: Doesn't report anxiety  Physical Exam: Vitals:   10/28/24 1016  BP: 108/62  Pulse: 94  Resp: 18  Temp: (!) 97.5 F (36.4 C)  SpO2: 99%    KPS: 80. General: Alert, cooperative, pleasant, in no acute distress Head: Normal EENT: No conjunctival injection or scleral icterus.  Lungs: Resp effort normal Cardiac: Regular rate Abdomen: Non-distended abdomen Skin: No rashes cyanosis or petechiae. Extremities: No clubbing or edema  Neurologic Exam: Mental Status: Awake, alert, attentive to examiner. Oriented to self and environment. Language is fluent with intact comprehension.  Cranial Nerves: Visual acuity is grossly normal. Visual fields are full. Extra-ocular movements intact. No ptosis. Face is symmetric Motor: Tone and bulk are normal. Power is full in both arms and legs. Reflexes are symmetric, no pathologic reflexes present.  Sensory: Intact to light touch Gait: Normal.   Labs: I have reviewed the data as listed    Component Value Date/Time   NA 144 10/15/2024 0414   K 3.4 (L) 10/15/2024 0414   CL 109 10/15/2024 0414   CO2 26 10/15/2024 0414   GLUCOSE 107 (H) 10/15/2024 0414   BUN 13 10/15/2024 0414   CREATININE 0.50 10/15/2024 0414   CREATININE 0.72 10/03/2016 0706   CALCIUM  8.3 (L) 10/15/2024 0414   PROT 5.7 (L) 10/15/2024 0414   ALBUMIN 3.3 (L) 10/15/2024 0414   AST 55 (H) 10/15/2024 0414   ALT 37  10/15/2024 0414   ALKPHOS 31 (L) 10/15/2024 0414   BILITOT 0.3 10/15/2024 0414   GFRNONAA >60 10/15/2024 0414   GFRAA >60 06/05/2020 0606   Lab Results  Component Value Date   WBC 8.2 10/15/2024   NEUTROABS 6.6 10/11/2024   HGB 9.6 (L) 10/15/2024   HCT 30.2 (L) 10/15/2024   MCV 89.3 10/15/2024   PLT 248 10/15/2024    Imaging: [facility] Clinician Interpretation: I have personally reviewed the CNS images as listed.  My interpretation, in the context of the patient's clinical presentation, is stable disease  MR BRAIN W WO CONTRAST Result Date: 10/25/2024 EXAM: MRI BRAIN WITH AND WITHOUT CONTRAST 10/25/2024 12:06:05 PM TECHNIQUE: Multiplanar multisequence MRI of the head/brain was performed with and without the administration of intravenous contrast. COMPARISON: 03/11/2024 and 10/28/2022 brain MRI. CLINICAL HISTORY: Brain/CNS neoplasm, assess treatment response. FINDINGS: BRAIN AND VENTRICLES: No acute infarct. No acute intracranial hemorrhage. No mass effect or midline shift. No hydrocephalus. Normal flow voids. No mass or abnormal enhancement. Old bilateral cerebellar infarcts. Old small vessel infarcts of the corona radiata and centrum semiovale. Generalized volume loss. Multifocal hyperintense T2-weighted signal within the cerebral white matter, most commonly due to chronic small vessel disease. 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 PITUITARY: Intrasellar/suprasellar mass measures 2.4 x 1.8 x 1.5 cm (AP x CC x Transverse), unchanged compared to 10/28/2022. Mild mass effect on the cisternal segment of the right optic nerve. Grade 3 cavernous sinus invasion on the right. Cavernous carotid flow voids are maintained. The infundibulum is deviated superiorly. ORBITS: Ocular  lens replacements. No acute abnormality. SINUSES: No acute abnormality. BONES AND SOFT TISSUES: Normal bone marrow signal and enhancement. No acute soft tissue abnormality. IMPRESSION: 1. Intrasellar/suprasellar  macroadenoma measuring 2.4 x 1.8 x 1.5 cm with mild mass effect on the cisternal segment of the right optic nerve, grade 3 right cavernous sinus invasion, and superior deviation of the infundibulum; unchanged from 10/28/2022. 2. Old bilateral cerebellar infarcts and chronic small vessel disease-related white matter changes. Electronically signed by: Franky Stanford MD 10/25/2024 01:08 PM EDT RP Workstation: [id number]   CT HEAD WO CONTRAST ( ) Result Date: 10/12/2024 EXAM: CT HEAD WITHOUT CONTRAST 10/12/2024 05:57:18 PM TECHNIQUE: CT of the head was performed without the administration of intravenous contrast. Automated exposure control, iterative reconstruction, and/or weight based adjustment of the mA/kV was utilized to reduce the radiation dose to as low as reasonably achievable. COMPARISON: Comparison made with prior CT from 07/04/2024 as well as earlier studies. CLINICAL HISTORY: Mental status change, unknown cause. FINDINGS: BRAIN AND VENTRICLES: Patchy hyperdensity involving the supratentorial cerebral white matter consistent with chronic 2-vessel ischemic disease, stable. Few small remote cerebellar infarcts, right greater than left, also stable. No acute intracranial hemorrhage. No visible acute large vessel territory infarct. No hydrocephalus. No extra-axial collection. No mass effect or midline shift. 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 No abnormal hyperdense vessel. Calcified atherosclerosis present about the skull base. ORBITS: No acute abnormality. SINUSES: No acute abnormality. SOFT TISSUES AND SKULL: No acute soft tissue abnormality. No skull fracture. IMPRESSION: 1. No acute intracranial abnormality. 2. 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 3. Chronic microvascular ischemic disease with a few small remote cerebellar infarcts, stable.  Electronically signed by: Morene Hoard MD 10/12/2024 06:33 PM EDT RP Workstation: [id number]   CT Chest Wo Contrast Result Date: 10/11/2024 EXAM: CT CHEST WITHOUT CONTRAST 10/11/2024 01:05:02 PM TECHNIQUE: CT of the chest was performed without the administration of intravenous contrast. Multiplanar reformatted images are provided for review. Automated exposure control, iterative reconstruction, and/or weight based adjustment of the mA/kV was utilized to reduce the radiation dose to as low as reasonably achievable. COMPARISON: 05/22/2024 CLINICAL HISTORY: pna type symptoms. FINDINGS: MEDIASTINUM: Heart and pericardium are unremarkable. The central airways are clear. Aortic atherosclerosis. LYMPH NODES: No mediastinal, hilar or axillary lymphadenopathy. LUNGS AND PLEURA: Minimal right pleural effusion is noted. Mild left posterior basilar opacity is noted concerning for subsegmental atelectasis or possibly pneumonia. No pneumothorax. SOFT TISSUES/BONES: Moderate compression deformity of T12 vertebral body is noted concerning for fracture of indeterminate age. No acute abnormality of the soft tissues. UPPER ABDOMEN: Limited images of the upper abdomen demonstrates no acute abnormality. IMPRESSION: 1. Mild left posterior basilar opacity, suspicious for subsegmental atelectasis or pneumonia. 2. Minimal right pleural effusion. 3. Moderate T12 vertebral body compression deformity, age indeterminate. Electronically signed by: Lynwood Seip MD 10/11/2024 02:30 PM EDT RP Workstation: [id number]   DG Chest 2 View Result Date: 10/11/2024 EXAM: 2 VIEW(S) XRAY OF THE CHEST 10/11/2024 11:11:51 AM COMPARISON: 05/29/2024 CLINICAL HISTORY: cough. Per Triage: Pt arrived [facility] in [place]. Pt has had coughing, nasal congestion and fatigue since yesterday. FINDINGS: LUNGS AND PLEURA: Minimal bibasilar subsegmental atelectasis. Minimal bilateral pleural effusions are noted. No pulmonary edema. No  pneumothorax. HEART AND MEDIASTINUM: No acute abnormality of the cardiac and mediastinal silhouettes. BONES AND SOFT TISSUES: No acute osseous abnormality. IMPRESSION: 1. Minimal bilateral pleural effusions. 2. Minimal bibasilar subsegmental atelectasis.  Electronically signed by: Lynwood Seip MD 10/11/2024 11:27 AM EDT RP Workstation: [id number]   MM 3D SCREENING MAMMOGRAM BILATERAL BREAST Result Date: 10/11/2024 CLINICAL DATA:  Screening. EXAM: DIGITAL SCREENING BILATERAL MAMMOGRAM WITH TOMOSYNTHESIS AND CAD TECHNIQUE: Bilateral screening digital craniocaudal and mediolateral oblique mammograms were obtained. Bilateral screening digital breast tomosynthesis was performed. The images were evaluated with computer-aided detection. COMPARISON:  Previous exam(s). ACR Breast Density Category b: There are scattered areas of fibroglandular density. FINDINGS: There are no findings suspicious for malignancy. IMPRESSION: No mammographic evidence of malignancy. A result letter of this screening mammogram will be mailed directly to the patient. RECOMMENDATION: Screening mammogram in one year. (Code:SM-B-01Y) BI-RADS CATEGORY  1: Negative. Electronically Signed   By: Alm Parkins M.D.   On: 10/11/2024 09:46   US  PELVIC COMPLETE WITH TRANSVAGINAL Result Date: 10/01/2024 Vaginal cuff WNL Neither ovary seen No adnexal masses No free fluid History of TAH      Assessment/Plan Pituitary tumor  [name] White is clinically and radiographically stable today.  MRI demonstrates stable findings.  Will con't daily ASA 81mg , high dose statin, aggressive diabetes management for secondary stroke prevention.  We appreciate the opportunity to participate in the care of [name] White.   We ask that [name] White return to clinic in 12 months following next brain MRI, or sooner as needed.  All questions were answered. The patient knows to call the clinic with any problems, questions or concerns. No barriers to learning  were detected.  The total time spent in the encounter was 30 minutes and more than 50% was on counseling and review of test results   Arthea [name] Manns, MD Medical Director of Neuro-Oncology [facility] [place] [facility] 10/28/24 10:19 AM

## 2024-10-30 ENCOUNTER — Ambulatory Visit

## 2024-10-30 DIAGNOSIS — E2839 Other primary ovarian failure: Secondary | ICD-10-CM | POA: Diagnosis not present

## 2024-10-30 DIAGNOSIS — N950 Postmenopausal bleeding: Secondary | ICD-10-CM

## 2024-11-08 ENCOUNTER — Emergency Department (HOSPITAL_COMMUNITY)

## 2024-11-08 ENCOUNTER — Encounter (HOSPITAL_COMMUNITY): Payer: Self-pay | Admitting: Emergency Medicine

## 2024-11-08 ENCOUNTER — Inpatient Hospital Stay (HOSPITAL_COMMUNITY)
Admission: EM | Admit: 2024-11-08 | Discharge: 2024-11-12 | DRG: 871 | Disposition: A | Discharge status: Skilled Nursing Facility | Source: Skilled Nursing Facility | Attending: Emergency Medicine | Admitting: Emergency Medicine

## 2024-11-08 DIAGNOSIS — Z7951 Long term (current) use of inhaled steroids: Secondary | ICD-10-CM

## 2024-11-08 DIAGNOSIS — F32A Depression, unspecified: Secondary | ICD-10-CM | POA: Diagnosis present

## 2024-11-08 DIAGNOSIS — Z7984 Long term (current) use of oral hypoglycemic drugs: Secondary | ICD-10-CM

## 2024-11-08 DIAGNOSIS — J188 Other pneumonia, unspecified organism: Secondary | ICD-10-CM | POA: Diagnosis not present

## 2024-11-08 DIAGNOSIS — Z66 Do not resuscitate: Secondary | ICD-10-CM | POA: Diagnosis present

## 2024-11-08 DIAGNOSIS — Z8672 Personal history of thrombophlebitis: Secondary | ICD-10-CM

## 2024-11-08 DIAGNOSIS — Z79899 Other long term (current) drug therapy: Secondary | ICD-10-CM

## 2024-11-08 DIAGNOSIS — R6520 Severe sepsis without septic shock: Secondary | ICD-10-CM | POA: Diagnosis present

## 2024-11-08 DIAGNOSIS — M810 Age-related osteoporosis without current pathological fracture: Secondary | ICD-10-CM | POA: Diagnosis present

## 2024-11-08 DIAGNOSIS — Z8249 Family history of ischemic heart disease and other diseases of the circulatory system: Secondary | ICD-10-CM

## 2024-11-08 DIAGNOSIS — E1165 Type 2 diabetes mellitus with hyperglycemia: Secondary | ICD-10-CM | POA: Diagnosis present

## 2024-11-08 DIAGNOSIS — Z794 Long term (current) use of insulin: Secondary | ICD-10-CM | POA: Diagnosis not present

## 2024-11-08 DIAGNOSIS — Z825 Family history of asthma and other chronic lower respiratory diseases: Secondary | ICD-10-CM

## 2024-11-08 DIAGNOSIS — F0394 Unspecified dementia, unspecified severity, with anxiety: Secondary | ICD-10-CM | POA: Diagnosis present

## 2024-11-08 DIAGNOSIS — Z8673 Personal history of transient ischemic attack (TIA), and cerebral infarction without residual deficits: Secondary | ICD-10-CM

## 2024-11-08 DIAGNOSIS — E782 Mixed hyperlipidemia: Secondary | ICD-10-CM | POA: Diagnosis present

## 2024-11-08 DIAGNOSIS — Z8701 Personal history of pneumonia (recurrent): Secondary | ICD-10-CM

## 2024-11-08 DIAGNOSIS — J189 Pneumonia, unspecified organism: Secondary | ICD-10-CM | POA: Diagnosis present

## 2024-11-08 DIAGNOSIS — F0393 Unspecified dementia, unspecified severity, with mood disturbance: Secondary | ICD-10-CM | POA: Diagnosis present

## 2024-11-08 DIAGNOSIS — M542 Cervicalgia: Secondary | ICD-10-CM | POA: Diagnosis present

## 2024-11-08 DIAGNOSIS — G8929 Other chronic pain: Secondary | ICD-10-CM | POA: Diagnosis present

## 2024-11-08 DIAGNOSIS — Z7982 Long term (current) use of aspirin: Secondary | ICD-10-CM

## 2024-11-08 DIAGNOSIS — E1142 Type 2 diabetes mellitus with diabetic polyneuropathy: Secondary | ICD-10-CM | POA: Diagnosis present

## 2024-11-08 DIAGNOSIS — Z823 Family history of stroke: Secondary | ICD-10-CM

## 2024-11-08 DIAGNOSIS — Z79890 Hormone replacement therapy (postmenopausal): Secondary | ICD-10-CM

## 2024-11-08 DIAGNOSIS — A419 Sepsis, unspecified organism: Principal | ICD-10-CM | POA: Diagnosis present

## 2024-11-08 DIAGNOSIS — I10 Essential (primary) hypertension: Secondary | ICD-10-CM | POA: Diagnosis present

## 2024-11-08 DIAGNOSIS — E039 Hypothyroidism, unspecified: Secondary | ICD-10-CM | POA: Diagnosis present

## 2024-11-08 DIAGNOSIS — Z1152 Encounter for screening for COVID-19: Secondary | ICD-10-CM

## 2024-11-08 DIAGNOSIS — Z79818 Long term (current) use of other agents affecting estrogen receptors and estrogen levels: Secondary | ICD-10-CM

## 2024-11-08 DIAGNOSIS — J441 Chronic obstructive pulmonary disease with (acute) exacerbation: Secondary | ICD-10-CM | POA: Insufficient documentation

## 2024-11-08 DIAGNOSIS — J440 Chronic obstructive pulmonary disease with acute lower respiratory infection: Secondary | ICD-10-CM | POA: Diagnosis present

## 2024-11-08 DIAGNOSIS — J181 Lobar pneumonia, unspecified organism: Secondary | ICD-10-CM | POA: Diagnosis present

## 2024-11-08 DIAGNOSIS — Z820 Family history of epilepsy and other diseases of the nervous system: Secondary | ICD-10-CM

## 2024-11-08 DIAGNOSIS — G9341 Metabolic encephalopathy: Secondary | ICD-10-CM | POA: Diagnosis present

## 2024-11-08 LAB — LACTIC ACID, PLASMA
Lactic Acid, Venous: 2.5 mmol/L (ref 0.5–1.9)
Lactic Acid, Venous: 2.1 mmol/L (ref 0.5–1.9)

## 2024-11-08 LAB — RESP PANEL BY RT-PCR (RSV, FLU A&B, COVID)  RVPGX2
Influenza A by PCR: NEGATIVE
Influenza B by PCR: NEGATIVE
Resp Syncytial Virus by PCR: NEGATIVE
SARS Coronavirus 2 by RT PCR: NEGATIVE

## 2024-11-08 LAB — COMPREHENSIVE METABOLIC PANEL WITH GFR
ALT: 40 U/L (ref 0–44)
AST: 36 U/L (ref 15–41)
Albumin: 4.1 g/dL (ref 3.5–5.0)
Alkaline Phosphatase: 49 U/L (ref 38–126)
Anion gap: 13 (ref 5–15)
BUN: 17 mg/dL (ref 8–23)
CO2: 26 mmol/L (ref 22–32)
Calcium: 9 mg/dL (ref 8.9–10.3)
Chloride: 96 mmol/L — ABNORMAL LOW (ref 98–111)
Creatinine, Ser: 0.69 mg/dL (ref 0.44–1.00)
GFR, Estimated: >60 mL/min (ref >60)
Glucose, Bld: 216 mg/dL — ABNORMAL HIGH (ref 70–99)
Potassium: 4.7 mmol/L (ref 3.5–5.1)
Sodium: 135 mmol/L (ref 135–145)
Total Bilirubin: 1 mg/dL (ref 0.0–1.2)
Total Protein: 6.8 g/dL (ref 6.5–8.1)

## 2024-11-08 LAB — CBC WITH DIFFERENTIAL/PLATELET
Abs Immature Granulocytes: 0.07 K/uL (ref 0.00–0.07)
Basophils Absolute: 0 K/uL (ref 0.0–0.1)
Basophils Relative: 0 %
Eosinophils Absolute: 0 K/uL (ref 0.0–0.5)
Eosinophils Relative: 0 %
HCT: 32.4 % — ABNORMAL LOW (ref 36.0–46.0)
Hemoglobin: 10.5 g/dL — ABNORMAL LOW (ref 12.0–15.0)
Immature Granulocytes: 1 %
Lymphocytes Relative: 7 %
Lymphs Abs: 0.9 K/uL (ref 0.7–4.0)
MCH: 29.1 pg (ref 26.0–34.0)
MCHC: 32.4 g/dL (ref 30.0–36.0)
MCV: 89.8 fL (ref 80.0–100.0)
Monocytes Absolute: 0.8 K/uL (ref 0.1–1.0)
Monocytes Relative: 5 %
Neutro Abs: 12.5 K/uL — ABNORMAL HIGH (ref 1.7–7.7)
Neutrophils Relative %: 87 %
Platelets: 228 K/uL (ref 150–400)
RBC: 3.61 MIL/uL — ABNORMAL LOW (ref 3.87–5.11)
RDW: 18.7 % — ABNORMAL HIGH (ref 11.5–15.5)
WBC: 14.4 K/uL — ABNORMAL HIGH (ref 4.0–10.5)
nRBC: 0 % (ref 0.0–0.2)

## 2024-11-08 LAB — TROPONIN T, HIGH SENSITIVITY
Troponin T High Sensitivity: 26 ng/L — ABNORMAL HIGH (ref 0–19)
Troponin T High Sensitivity: 21 ng/L — ABNORMAL HIGH (ref 0–19)

## 2024-11-08 LAB — PRO BRAIN NATRIURETIC PEPTIDE: Pro Brain Natriuretic Peptide: 1197 pg/mL — ABNORMAL HIGH (ref <300.0)

## 2024-11-08 LAB — PROCALCITONIN: Procalcitonin: 0.13 ng/mL

## 2024-11-08 LAB — GLUCOSE, CAPILLARY: Glucose-Capillary: 146 mg/dL — ABNORMAL HIGH (ref 70–99)

## 2024-11-08 MED ORDER — SODIUM CHLORIDE 0.9 % IV SOLN
2.0000 g | Freq: Once | INTRAVENOUS | Status: AC
  Administered 2024-11-08: 2 g via INTRAVENOUS
  Filled 2024-11-08: qty 20

## 2024-11-08 MED ORDER — SODIUM CHLORIDE 0.9 % IV SOLN: INTRAVENOUS | Status: DC

## 2024-11-08 MED ORDER — INSULIN ASPART 100 UNIT/ML IJ SOLN
0.0000 [IU] | Freq: Every day | INTRAMUSCULAR | Status: DC
  Administered 2024-11-10: 2 [IU] via SUBCUTANEOUS
  Administered 2024-11-11: 3 [IU] via SUBCUTANEOUS
  Filled 2024-11-08 (×2): qty 1

## 2024-11-08 MED ORDER — LACTATED RINGERS IV BOLUS
1000.0000 mL | Freq: Once | INTRAVENOUS | Status: AC
  Administered 2024-11-08: 1000 mL via INTRAVENOUS

## 2024-11-08 MED ORDER — VORTIOXETINE HBR 5 MG PO TABS
10.0000 mg | ORAL_TABLET | Freq: Every day | ORAL | Status: DC
  Administered 2024-11-09: 10 mg via ORAL
  Filled 2024-11-08: qty 2
  Filled 2024-11-08: qty 1

## 2024-11-08 MED ORDER — INSULIN ASPART 100 UNIT/ML IJ SOLN
0.0000 [IU] | Freq: Three times a day (TID) | INTRAMUSCULAR | Status: DC
  Administered 2024-11-09 – 2024-11-10 (×2): 2 [IU] via SUBCUTANEOUS
  Administered 2024-11-10 – 2024-11-11 (×2): 3 [IU] via SUBCUTANEOUS
  Administered 2024-11-11: 8 [IU] via SUBCUTANEOUS
  Administered 2024-11-11: 5 [IU] via SUBCUTANEOUS
  Administered 2024-11-12: 8 [IU] via SUBCUTANEOUS
  Administered 2024-11-12: 5 [IU] via SUBCUTANEOUS
  Filled 2024-11-08 (×8): qty 1

## 2024-11-08 MED ORDER — SODIUM CHLORIDE 0.9 % IV SOLN
500.0000 mg | INTRAVENOUS | Status: DC
  Administered 2024-11-09 – 2024-11-11 (×3): 500 mg via INTRAVENOUS
  Filled 2024-11-08 (×3): qty 5

## 2024-11-08 MED ORDER — GUAIFENESIN ER 600 MG PO TB12
600.0000 mg | ORAL_TABLET | Freq: Two times a day (BID) | ORAL | Status: DC
  Administered 2024-11-08 – 2024-11-12 (×7): 600 mg via ORAL
  Filled 2024-11-08 (×8): qty 1

## 2024-11-08 MED ORDER — ASPIRIN 81 MG PO TBEC
81.0000 mg | DELAYED_RELEASE_TABLET | Freq: Every day | ORAL | Status: DC
  Administered 2024-11-09 – 2024-11-12 (×4): 81 mg via ORAL
  Filled 2024-11-08 (×5): qty 1

## 2024-11-08 MED ORDER — SODIUM CHLORIDE 0.9 % IV BOLUS
1000.0000 mL | Freq: Once | INTRAVENOUS | Status: AC
  Administered 2024-11-08: 1000 mL via INTRAVENOUS

## 2024-11-08 MED ORDER — ATORVASTATIN CALCIUM 40 MG PO TABS
80.0000 mg | ORAL_TABLET | Freq: Every day | ORAL | Status: DC
  Administered 2024-11-09 – 2024-11-12 (×4): 80 mg via ORAL
  Filled 2024-11-08 (×5): qty 2

## 2024-11-08 MED ORDER — ONDANSETRON HCL 4 MG/2ML IJ SOLN: 4.0000 mg | Freq: Four times a day (QID) | INTRAMUSCULAR | Status: DC | PRN

## 2024-11-08 MED ORDER — SODIUM CHLORIDE 0.9 % IV SOLN
500.0000 mg | INTRAVENOUS | Status: DC
  Administered 2024-11-08: 500 mg via INTRAVENOUS
  Filled 2024-11-08: qty 5

## 2024-11-08 MED ORDER — LEVOTHYROXINE SODIUM 50 MCG PO TABS
50.0000 ug | ORAL_TABLET | Freq: Every day | ORAL | Status: DC
  Administered 2024-11-09 – 2024-11-12 (×4): 50 ug via ORAL
  Filled 2024-11-08 (×4): qty 1

## 2024-11-08 MED ORDER — IPRATROPIUM-ALBUTEROL 0.5-2.5 (3) MG/3ML IN SOLN
3.0000 mL | Freq: Once | RESPIRATORY_TRACT | Status: AC
  Administered 2024-11-08: 3 mL via RESPIRATORY_TRACT
  Filled 2024-11-08: qty 3

## 2024-11-08 MED ORDER — BUSPIRONE HCL 5 MG PO TABS
5.0000 mg | ORAL_TABLET | Freq: Three times a day (TID) | ORAL | Status: DC | PRN
  Administered 2024-11-12: 5 mg via ORAL
  Filled 2024-11-08: qty 1

## 2024-11-08 MED ORDER — ONDANSETRON HCL 4 MG PO TABS: 4.0000 mg | ORAL_TABLET | Freq: Four times a day (QID) | ORAL | Status: DC | PRN

## 2024-11-08 MED ORDER — PREGABALIN 75 MG PO CAPS
150.0000 mg | ORAL_CAPSULE | Freq: Two times a day (BID) | ORAL | Status: DC
  Administered 2024-11-08 – 2024-11-12 (×7): 150 mg via ORAL
  Filled 2024-11-08 (×8): qty 2

## 2024-11-08 MED ORDER — TIZANIDINE HCL 2 MG PO TABS: 2.0000 mg | ORAL_TABLET | Freq: Two times a day (BID) | ORAL | Status: DC | PRN

## 2024-11-08 MED ORDER — TRAZODONE HCL 50 MG PO TABS
50.0000 mg | ORAL_TABLET | Freq: Every evening | ORAL | Status: DC | PRN
  Administered 2024-11-11: 50 mg via ORAL
  Filled 2024-11-08: qty 1

## 2024-11-08 MED ORDER — ENOXAPARIN SODIUM 40 MG/0.4ML IJ SOSY
40.0000 mg | PREFILLED_SYRINGE | Freq: Every day | INTRAMUSCULAR | Status: DC
  Administered 2024-11-08 – 2024-11-11 (×4): 40 mg via SUBCUTANEOUS
  Filled 2024-11-08 (×4): qty 0.4

## 2024-11-08 MED ORDER — FLUTICASONE FUROATE-VILANTEROL 200-25 MCG/ACT IN AEPB: 1.0000 | INHALATION_SPRAY | Freq: Every day | RESPIRATORY_TRACT | Status: DC

## 2024-11-08 MED ORDER — VENLAFAXINE HCL ER 75 MG PO CP24
150.0000 mg | ORAL_CAPSULE | Freq: Every day | ORAL | Status: DC
  Administered 2024-11-09 – 2024-11-12 (×4): 150 mg via ORAL
  Filled 2024-11-08 (×5): qty 2

## 2024-11-08 MED ORDER — UMECLIDINIUM BROMIDE 62.5 MCG/ACT IN AEPB: 1.0000 | INHALATION_SPRAY | Freq: Every day | RESPIRATORY_TRACT | Status: DC

## 2024-11-08 MED ORDER — SODIUM CHLORIDE 0.9 % IV SOLN
2.0000 g | INTRAVENOUS | Status: DC
  Administered 2024-11-09 – 2024-11-11 (×3): 2 g via INTRAVENOUS
  Filled 2024-11-08 (×3): qty 20

## 2024-11-08 MED ORDER — ALBUTEROL SULFATE (2.5 MG/3ML) 0.083% IN NEBU
2.5000 mg | INHALATION_SOLUTION | RESPIRATORY_TRACT | Status: DC | PRN
Start: 2024-11-08 — End: 2024-11-12

## 2024-11-08 NOTE — ED Provider Notes (Signed)
 [place] EMERGENCY DEPARTMENT AT [facility] Provider Note  CSN: [id number] Arrival date & time: 11/08/24 1521  Chief Complaint(s) Shortness of Breath  HPI [name] White is a 83 y.o. female history of prior stroke, COPD, diabetes, hypertension, hyperlipidemia denting with shortness of breath.  Patient reports recently diagnosed with pneumonia, not currently on antibiotics.  Reports that she has been having some worsening cough.  Occasionally reports shortness of breath.  Denies any chest pain.  Denies fevers or chills.  Denies any abdominal pain.  Denies any leg swelling.  Patient staying at assisted living facility, had physician appointment today and was sent to ER from appointment.   Past Medical History Past Medical History:  Diagnosis Date   Anemia    Anxiety    Asthma    ASYMPTOMATIC POSTMENOPAUSAL STATUS 02/09/2009   Cataract    Cerebral infarction [facility])    Constipation 03/19/2013   Depression    DIABETES MELLITUS, TYPE II 09/14/2007   HYPERCHOLESTEROLEMIA 02/09/2009   HYPERTENSION 02/09/2009   HYPOTHYROIDISM 09/14/2007   Metabolic encephalopathy    MIGRAINE HEADACHE 09/14/2007   Neuropathy    OSTEOPOROSIS 02/09/2009   PANCREATITIS 09/14/2007   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   PONV (postoperative nausea and vomiting)    Protein calorie malnutrition    Rectocele 03/19/2013   Shortness of breath    SUPERFICIAL PHLEBITIS 09/14/2007   Varicose veins    Patient Active Problem List   Diagnosis Date Noted   CAP (community acquired pneumonia) 10/11/2024   Abnormal gait 07/10/2024   Cervical radiculopathy 07/10/2024   Chronic neck pain 07/10/2024   Visual disturbance 07/10/2024   Recurrent UTI 07/09/2024   Hypomagnesemia 05/01/2024   Acute respiratory failure with hypoxemia (HCC) 04/24/2024   CVA (cerebral vascular accident) (HCC) 03/12/2024   Anxiety and depression 03/12/2024   GERD without esophagitis 03/12/2024   Rhabdomyolysis 12/27/2023   Pressure  injury of skin 12/27/2023   Status post transsphenoidal pituitary resection 06/04/2020   Pituitary adenoma with extrasellar extension (HCC) 06/04/2020   Pituitary tumor 05/29/2020   Dizzinesses 02/02/2020   Vitamin D  deficiency 01/28/2020   Paroxysmal supraventricular tachycardia 06/19/2014   Rectocele 03/19/2013   Constipation 03/19/2013   HYPERCHOLESTEROLEMIA 02/09/2009   Essential hypertension 02/09/2009   Osteoporosis 02/09/2009   Post-menopausal 02/09/2009   HEAT INTOLERANCE 02/01/2008   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   Hypothyroidism 09/14/2007   Migraine headache 09/14/2007   SUPERFICIAL PHLEBITIS 09/14/2007   MENOPAUSAL SYNDROME 09/14/2007   FATIGUE 09/14/2007   Type 2 diabetes mellitus with peripheral neuropathy (HCC) 09/14/2007   Home Medication(s) Prior to Admission medications   Medication Sig Start Date End Date Taking? Authorizing Provider  acetaminophen  (TYLENOL ) 325 MG tablet Take 2 tablets (650 mg total) by mouth every 6 (six) hours as needed for mild pain (pain score 1-3) (or Fever >/= 101). 01/01/24   Shahmehdi, Adriana LABOR, MD  albuterol  (VENTOLIN  HFA) 108 (90 Base) MCG/ACT inhaler Inhale 2 puffs into the lungs every 4 (four) hours as needed for wheezing or shortness of breath. 05/01/24   Johnson, Clanford L, MD  aspirin  EC 81 MG tablet Take 81 mg by mouth daily. Swallow whole.    [provider]  atorvastatin  (LIPITOR) 80 MG tablet Take 1 tablet (80 mg total) by mouth daily. 10/22/24   Camara, Amadou, MD  busPIRone (BUSPAR) 5 MG tablet Take 5 mg by mouth 3 (three) times daily as needed (anxiety).    [provider]  cyanocobalamin  (VITAMIN B12) 1000 MCG/ML injection Inject 1,000  mcg into the muscle every 30 (thirty) days. 07/26/24   [provider]  cycloSPORINE  (RESTASIS ) 0.05 % ophthalmic emulsion Place 1 drop into both eyes 2 (two) times daily.    [provider]  dimethicone 1 % cream Apply 1 Application topically 2 (two) times daily as  needed for dry skin (pressure injury on sacrum).    [provider]  Estradiol  (IMVEXXY  MAINTENANCE PACK) 10 MCG INST Place 1 suppository vaginally 2 (two) times a week. 10/14/24   [name] POUR, MD  fluticasone  furoate-vilanterol (BREO ELLIPTA ) 200-25 MCG/ACT AEPB Inhale 1 puff into the lungs daily. 10/16/24   Ricky Fines, MD  furosemide  (LASIX ) 20 MG tablet Take 1 tablet (20 mg total) by mouth daily as needed for edema or fluid. 10/16/24 10/16/25  Ricky Fines, MD  ipratropium-albuterol  (DUONEB) 0.5-2.5 (3) MG/3ML SOLN Take 3 mLs by nebulization every 4 (four) hours as needed (wheezing and SOB). 10/16/24   Ricky Fines, MD  levothyroxine  (SYNTHROID ) 50 MCG tablet Take 50 mcg by mouth daily.    [provider]  metFORMIN  (GLUCOPHAGE ) 1000 MG tablet Take 1,000 mg by mouth 2 (two) times daily. 10/01/24   [provider]  midodrine  (PROAMATINE ) 5 MG tablet Take 5 mg by mouth 3 (three) times daily with meals. 06/05/24   [provider]  Multiple Vitamins-Minerals (THERA-M) TABS Take 1 tablet by mouth daily.    [provider]  nitrofurantoin  (MACRODANTIN ) 100 MG capsule Take 1 capsule (100 mg total) by mouth daily. 10/21/24   Ricky Fines, MD  NONFORMULARY OR COMPOUNDED ITEM If patient is incontinent of urine and/or stool, SNF staff are instructed to cleanse patient's genital area at least once every 8 hours and change diaper if soiled. 07/10/24   Gerldine Lauraine BROCKS, FNP  NONFORMULARY OR COMPOUNDED ITEM SNF staff are instructed to encourage hydration with water  at least once every 2-4 hours while patient is awake. 07/10/24   Larocco, Sarah C, FNP  NONFORMULARY OR COMPOUNDED ITEM SNF staff are instructed to prompt toileting and assist patient with ambulation to toilet at least once every 4-6 hours while awake. 07/10/24   Larocco, Sarah C, FNP  omeprazole (PRILOSEC) 20 MG capsule Take 1 capsule (20 mg total) by mouth 2 (two) times daily before a meal.  10/16/24   Ricky Fines, MD  polyethylene glycol (MIRALAX  / GLYCOLAX ) 17 g packet Take 17 g by mouth daily as needed for mild constipation. 05/01/24   Johnson, Clanford L, MD  potassium chloride  (KLOR-CON ) 10 MEQ tablet Take 10 mEq by mouth daily. 05/15/24   [provider]  predniSONE  (DELTASONE ) 20 MG tablet Take 2 tablets by mouth daily X 2 days; then 1 tablet by mouth daily X 3 days, then 1/2 tablet by mouth daily X 4 days and then start prednisone  5mg  daily (1 week). Patient not taking: Reported on 10/28/2024 10/16/24   Ricky Fines, MD  predniSONE  (DELTASONE ) 5 MG tablet Take 1 tablet (5 mg total) by mouth daily with breakfast. 10/24/24   Ricky Fines, MD  pregabalin  (LYRICA ) 150 MG capsule Take 1 capsule (150 mg total) by mouth 2 (two) times daily. 03/19/24   Pearlean Manus, MD  rizatriptan  (MAXALT -MLT) 10 MG disintegrating tablet Take 10 mg by mouth as needed for migraine. 09/12/24   [provider]  tiZANidine (ZANAFLEX) 2 MG tablet Take 2 mg by mouth 2 (two) times daily as needed for muscle spasms. 06/21/24   [provider]  traZODone  (DESYREL ) 50 MG tablet Take 1  tablet (50 mg total) by mouth at bedtime as needed for sleep. 03/19/24   Pearlean Manus, MD  TRINTELLIX  10 MG TABS tablet Take 10 mg by mouth daily. 03/09/24   [provider]  umeclidinium bromide  (INCRUSE ELLIPTA ) 62.5 MCG/ACT AEPB Inhale 1 puff into the lungs daily. 10/16/24   Ricky Fines, MD  venlafaxine  (EFFEXOR -XR) 150 MG 24 hr capsule Take 150 mg by mouth every morning.    [provider]                                                                                                                                    Past Surgical History Past Surgical History:  Procedure Laterality Date   ABDOMINAL HYSTERECTOMY     ANTERIOR AND POSTERIOR REPAIR N/A 04/02/2013   Procedure: ANTERIOR (CYSTOCELE) AND POSTERIOR REPAIR (RECTOCELE);  Surgeon: Norleen LULLA Server, MD;  Location: [facility]  ORS;  Service: Gynecology;  Laterality: N/A;   APPENDECTOMY     BRAIN SURGERY     CATARACT EXTRACTION W/PHACO  12/05/2011   Procedure: CATARACT EXTRACTION PHACO AND INTRAOCULAR LENS PLACEMENT (IOC);  Surgeon: Cherene Mania;  Location: [facility] ORS;  Service: Ophthalmology;  Laterality: Left;  CDE:9.65   CHOLECYSTECTOMY     CRANIOTOMY N/A 06/04/2020   Procedure: Endoscopic Transphenoidal Resection of Recurrent PituitaryTumor;  Surgeon: Colon Shove, MD;  Location: [facility] OR;  Service: Neurosurgery;  Laterality: N/A;   CYSTOSCOPY     ENDOVENOUS ABLATION SAPHENOUS VEIN W/ LASER  11-03-2011   right greater saphenous vein   left leg done 10-2011   EYE SURGERY  98   right cataract extraction 98   LAPAROSCOPIC NISSEN FUNDOPLICATION     NM ESOPHAGEAL REFLUX  08-11-11   PITUITARY EXCISION  10/1997   POSTERIOR REPAIR     TRANSNASAL APPROACH N/A 06/04/2020   Procedure: TRANSNASAL APPROACH;  Surgeon: Mable Lenis, MD;  Location: [facility] OR;  Service: ENT;  Laterality: N/A;   TRANSPHENOIDAL / TRANSNASAL HYPOPHYSECTOMY / RESECTION PITUITARY TUMOR  08-11-11   Family History Family History  Problem Relation Age of Onset   Heart disease Mother    Asthma Mother    COPD Father    Heart disease Father    Stroke Father    Asthma Daughter    Migraines Daughter    Neuropathy Son    Cancer Neg Hx    Anesthesia problems Neg Hx    Hypotension Neg Hx    Malignant hyperthermia Neg Hx    Pseudochol deficiency Neg Hx     Social History Social History   Tobacco Use   Smoking status: Never   Smokeless tobacco: Never  Vaping Use   Vaping status: Never Used  Substance Use Topics   Alcohol use: No    Alcohol/week: 0.0 standard drinks of alcohol   Drug use: No   Allergies Betadine [povidone iodine] and Penicillins  Review of Systems Review of Systems  All other systems  reviewed and are negative.   Physical Exam Vital Signs  I have reviewed the triage vital signs BP 110/71   Pulse 93   Temp 97.7 F  (36.5 C)   Resp (!) 21   Ht 5' 7 (1.702 m)   Wt 59 kg   SpO2 98%   BMI 20.36 kg/m  Physical Exam Vitals and nursing note reviewed.  Constitutional:      General: She is not in acute distress.    Appearance: She is well-developed. She is ill-appearing (chronically).  HENT:     Head: Normocephalic and atraumatic.     Mouth/Throat:     Mouth: Mucous membranes are moist.  Eyes:     Pupils: Pupils are equal, round, and reactive to light.  Cardiovascular:     Rate and Rhythm: Normal rate and regular rhythm.     Heart sounds: No murmur heard. Pulmonary:     Effort: Pulmonary effort is normal. No respiratory distress.     Breath sounds: Examination of the right-middle field reveals rhonchi and rales. Examination of the left-middle field reveals rhonchi and rales. Examination of the right-lower field reveals rhonchi and rales. Examination of the left-lower field reveals rhonchi and rales. Rhonchi and rales present.  Abdominal:     General: Abdomen is flat.     Palpations: Abdomen is soft.     Tenderness: There is no abdominal tenderness.  Musculoskeletal:        General: No tenderness.     Right lower leg: No edema.     Left lower leg: No edema.  Skin:    General: Skin is warm and dry.  Neurological:     General: No focal deficit present.     Mental Status: She is alert. Mental status is at baseline.  Psychiatric:        Mood and Affect: Mood normal.        Behavior: Behavior normal.     ED Results and Treatments Labs (all labs ordered are listed, but only abnormal results are displayed) Labs Reviewed  COMPREHENSIVE METABOLIC PANEL WITH GFR - Abnormal; Notable for the following components:      Result Value   Chloride 96 (*)    Glucose, Bld 216 (*)    All other components within normal limits  CBC WITH DIFFERENTIAL/PLATELET - Abnormal; Notable for the following components:   WBC 14.4 (*)    RBC 3.61 (*)    Hemoglobin 10.5 (*)    HCT 32.4 (*)    RDW 18.7 (*)     Neutro Abs 12.5 (*)    All other components within normal limits  PRO BRAIN NATRIURETIC PEPTIDE - Abnormal; Notable for the following components:   Pro Brain Natriuretic Peptide 1,197.0 (*)    All other components within normal limits  LACTIC ACID, PLASMA - Abnormal; Notable for the following components:   Lactic Acid, Venous 2.5 (*)    All other components within normal limits  TROPONIN T, HIGH SENSITIVITY - Abnormal; Notable for the following components:   Troponin T High Sensitivity 26 (*)    All other components within normal limits  CULTURE, BLOOD (ROUTINE X 2)  CULTURE, BLOOD (ROUTINE X 2)  LACTIC ACID, PLASMA  TROPONIN T, HIGH SENSITIVITY  Radiology CT Chest Wo Contrast Result Date: 11/08/2024 CLINICAL DATA:  Fever nonproductive cough EXAM: CT CHEST WITHOUT CONTRAST TECHNIQUE: Multidetector CT imaging of the chest was performed following the standard protocol without IV contrast. RADIATION DOSE REDUCTION: This exam was performed according to the departmental dose-optimization program which includes automated exposure control, adjustment of the mA and/or kV according to patient size and/or use of iterative reconstruction technique. COMPARISON:  Chest x-ray 11/08/2024, chest CT 10/11/2024, 05/22/2024 FINDINGS: Cardiovascular: Limited assessment without intravenous contrast. Mild aortic atherosclerosis. No aneurysm. Mitral calcification. Mild aortic valvular calcification. Normal cardiac size. No pericardial effusion Mediastinum/Nodes: Patent trachea. No thyroid  mass. Mildly prominent precarinal node measuring 11 mm. Limited assessment for hilar nodes in the absence of contrast. Subcarinal lymph node measures 14 mm. Esophagus within normal limits. Lungs/Pleura: Trace right-sided pleural effusion. Multifocal bilateral ground-glass densities and small foci of consolidation.  Upper Abdomen: No acute finding. Stable 11 mm left adrenal nodule consistent with adenoma. Musculoskeletal: Moderate superior endplate deformity T12, stable compared with CT earlier this month, new compared with CT from May. Suspicion of mild paravertebral hazy density as may be seen with subacute fracture. IMPRESSION: 1. Multifocal bilateral ground-glass densities and small foci of consolidation, suspect for multifocal pneumonia. Trace right-sided pleural effusion. 2. Mildly prominent mediastinal lymph nodes, likely reactive. 3. Moderate superior endplate deformity at T12, stable compared with CT earlier this month, new compared with CT from May. Suspicion of mild paravertebral hazy density as may be seen with subacute fracture. 4. Aortic atherosclerosis. Aortic Atherosclerosis (ICD10-I70.0). Electronically Signed   By: Luke Bun M.D.   On: 11/08/2024 18:03   DG Chest Portable 1 View Result Date: 11/08/2024 EXAM: 1 VIEW(S) XRAY OF THE CHEST 11/08/2024 04:05:00 PM COMPARISON: 10/11/2024 CLINICAL HISTORY: SOB, cough. FINDINGS: LUNGS AND PLEURA: Stable minimal interstitial densities are noted which may represent scarring but minimal pulmonary edema cannot be excluded. Minimal pleural effusions may be present. No pneumothorax. HEART AND MEDIASTINUM: Stable cardiomediastinal silhouette. BONES AND SOFT TISSUES: No acute osseous abnormality. IMPRESSION: 1. Stable minimal interstitial densities, possibly representing scarring or minimal pulmonary edema. 2. Stable minimal pleural effusions. Electronically signed by: Lynwood Seip MD 11/08/2024 04:18 PM EST RP Workstation: [id number]    Pertinent labs & imaging results that were available during my care of the patient were reviewed by me and considered in my medical decision making (see MDM for details).  Medications Ordered in ED Medications  azithromycin  (ZITHROMAX ) 500 mg in sodium chloride  0.9 % 250 mL IVPB (500 mg Intravenous New Bag/Given 11/08/24 1914)   lactated ringers  bolus 1,000 mL (has no administration in time range)  sodium chloride  0.9 % bolus 1,000 mL (0 mLs Intravenous Stopped 11/08/24 1932)  ipratropium-albuterol  (DUONEB) 0.5-2.5 (3) MG/3ML nebulizer solution 3 mL (3 mLs Nebulization Given 11/08/24 1604)  cefTRIAXone  (ROCEPHIN ) 2 g in sodium chloride  0.9 % 100 mL IVPB (0 g Intravenous Stopped 11/08/24 1913)  Procedures .Critical Care  Performed by: Francesca Elsie CROME, MD Authorized by: Francesca Elsie CROME, MD   Critical care provider statement:    Critical care time (minutes):  30   Critical care was necessary to treat or prevent imminent or life-threatening deterioration of the following conditions:  Sepsis   Critical care was time spent personally by me on the following activities:  Development of treatment plan with patient or surrogate, discussions with consultants, evaluation of patient's response to treatment, examination of patient, ordering and review of laboratory studies, ordering and review of radiographic studies, ordering and performing treatments and interventions, pulse oximetry, re-evaluation of patient's condition and review of old charts   Care discussed with: admitting provider     (including critical care time)  Medical Decision Making / ED Course   MDM:  83 year old presenting to the emergency department with shortness of breath and cough.  On exam, patient has diffuse rhonchi and rales on exam.  Frequent cough also.  Blood pressure borderline low.  Reviewing history, patient has frequent prior admission for pneumonia/possible COPD.  Suspect this is current cause of symptoms.  Differential also includes COPD, pneumothorax, CHF, ACS but symptoms seem more consistent overall with pneumonia.  Clinical Course as of 11/08/24 1934  Fri Nov 08, 2024  1900 Workup with multifocal  pneumonia. Lactic is elevated. Patient technically meeting sepsis criteria so sepsis alert activated. Will add additional fluids. Admitted to Dr. Stinson  [WS]    Clinical Course User Index [WS] Francesca Elsie CROME, MD     Additional history obtained: -Additional history obtained from ems -External records from outside source obtained and reviewed including: Chart review including previous notes, labs, imaging, consultation notes including prior notes    Lab Tests: -I ordered, reviewed, and interpreted labs.   The pertinent results include:   Labs Reviewed  COMPREHENSIVE METABOLIC PANEL WITH GFR - Abnormal; Notable for the following components:      Result Value   Chloride 96 (*)    Glucose, Bld 216 (*)    All other components within normal limits  CBC WITH DIFFERENTIAL/PLATELET - Abnormal; Notable for the following components:   WBC 14.4 (*)    RBC 3.61 (*)    Hemoglobin 10.5 (*)    HCT 32.4 (*)    RDW 18.7 (*)    Neutro Abs 12.5 (*)    All other components within normal limits  PRO BRAIN NATRIURETIC PEPTIDE - Abnormal; Notable for the following components:   Pro Brain Natriuretic Peptide 1,197.0 (*)    All other components within normal limits  LACTIC ACID, PLASMA - Abnormal; Notable for the following components:   Lactic Acid, Venous 2.5 (*)    All other components within normal limits  TROPONIN T, HIGH SENSITIVITY - Abnormal; Notable for the following components:   Troponin T High Sensitivity 26 (*)    All other components within normal limits  CULTURE, BLOOD (ROUTINE X 2)  CULTURE, BLOOD (ROUTINE X 2)  LACTIC ACID, PLASMA  TROPONIN T, HIGH SENSITIVITY    Notable for leukocytosis, lactic acidosis  EKG   EKG Interpretation Date/Time:  Friday November 08 2024 15:41:55 EST Ventricular Rate:  92 PR Interval:  129 QRS Duration:  89 QT Interval:  377 QTC Calculation: 467 R Axis:   60  Text Interpretation: Sinus rhythm Confirmed by Francesca Elsie ([id number]) on  11/08/2024 4:16:55 PM         Imaging Studies ordered: I ordered imaging studies including CXR On my interpretation imaging  demonstrates pneumonia  I independently visualized and interpreted imaging. I agree with the radiologist interpretation   Medicines ordered and prescription drug management: Meds ordered this encounter  Medications   sodium chloride  0.9 % bolus 1,000 mL   ipratropium-albuterol  (DUONEB) 0.5-2.5 (3) MG/3ML nebulizer solution 3 mL   cefTRIAXone  (ROCEPHIN ) 2 g in sodium chloride  0.9 % 100 mL IVPB    Antibiotic Indication::   CAP   azithromycin  (ZITHROMAX ) 500 mg in sodium chloride  0.9 % 250 mL IVPB    Antibiotic Indication::   CAP   lactated ringers  bolus 1,000 mL    -I have reviewed the patients home medicines and have made adjustments as needed   Consultations Obtained: I requested consultation with the hospitalist,  and discussed lab and imaging findings as well as pertinent plan - they recommend: admission   Cardiac Monitoring: The patient was maintained on a cardiac monitor.  I personally viewed and interpreted the cardiac monitored which showed an underlying rhythm of: NSR  Reevaluation: After the interventions noted above, I reevaluated the patient and found that their symptoms have improved  Co morbidities that complicate the patient evaluation  Past Medical History:  Diagnosis Date   Anemia    Anxiety    Asthma    ASYMPTOMATIC POSTMENOPAUSAL STATUS 02/09/2009   Cataract    Cerebral infarction (HCC)    Constipation 03/19/2013   Depression    DIABETES MELLITUS, TYPE II 09/14/2007   HYPERCHOLESTEROLEMIA 02/09/2009   HYPERTENSION 02/09/2009   HYPOTHYROIDISM 09/14/2007   Metabolic encephalopathy    MIGRAINE HEADACHE 09/14/2007   Neuropathy    OSTEOPOROSIS 02/09/2009   PANCREATITIS 09/14/2007   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   PONV (postoperative nausea and vomiting)    Protein calorie malnutrition    Rectocele 03/19/2013   Shortness  of breath    SUPERFICIAL PHLEBITIS 09/14/2007   Varicose veins       Dispostion: Disposition decision including need for hospitalization was considered, and patient admitted to the hospital.    Final Clinical Impression(s) / ED Diagnoses Final diagnoses:  Multifocal pneumonia     This chart was dictated using voice recognition software.  Despite best efforts to proofread,  errors can occur which can change the documentation meaning.    Francesca Elsie CROME, MD 11/08/24 [phone]

## 2024-11-08 NOTE — Consult Note (Signed)
 CODE SEPSIS - PHARMACY COMMUNICATION  **Broad Spectrum Antibiotics should be administered within 1 hour of Sepsis diagnosis**  Time Code Sepsis Called/Page Received: 1857  Antibiotics Ordered: azithromycin  and ceftriaxone   Time of 1st antibiotic administration: ceftriaxone  (1st dose given before code sepsis activated)  Additional action taken by pharmacy: none  If necessary, Name of Provider/Nurse Contacted: n/a    [name] White PharmD, BCPS 11/08/2024 7:12 PM

## 2024-11-08 NOTE — ED Triage Notes (Signed)
 Pt here from a Md office sent over for sob , pt has a non productive cough , maybe a fever at the assisted living , pt just had PNA last month

## 2024-11-08 NOTE — Sepsis Progress Note (Signed)
 Elink monitoring for the code sepsis protocol.

## 2024-11-08 NOTE — H&P (Signed)
 History and Physical    Patient: [name] White FMW:[id number] DOB: Dec 02, 1941 DOA: 11/08/2024 DOS: the patient was seen and examined on 11/08/2024 PCP: [name], MD  Patient coming from: ALF/ILF  Chief Complaint:  Chief Complaint  Patient presents with   Shortness of Breath   HPI: [name] White is a 83 y.o. female with medical history significant of diabetes, hypertension, hypothyroidism, dementia, malnutrition, history of CVA, asthma.  [facility].  She was admitted in the middle of October with pneumonia and reactive airway disease.  She was discharged back to the assisted living facilities on long-acting inhalers and has been doing fairly well, except she never stops coughing.  Recently, her coughing became worse.  No fevers here, but may have had a fever at the assisted living facility.  She was seen today at her PCPs office, Dr. [name], who sent her here here for evaluation.  Here, white count was 14.4, BMP was 1197.  Chest x-ray was not conclusive of pneumonia.  CT scan showed multifocal pneumonia.  Review of Systems: As mentioned in the history of present illness. All other systems reviewed and are negative. Past Medical History:  Diagnosis Date   Anemia    Anxiety    Asthma    ASYMPTOMATIC POSTMENOPAUSAL STATUS 02/09/2009   Cataract    Cerebral infarction [facility])    Constipation 03/19/2013   Depression    DIABETES MELLITUS, TYPE II 09/14/2007   HYPERCHOLESTEROLEMIA 02/09/2009   HYPERTENSION 02/09/2009   HYPOTHYROIDISM 09/14/2007   Metabolic encephalopathy    MIGRAINE HEADACHE 09/14/2007   Neuropathy    OSTEOPOROSIS 02/09/2009   PANCREATITIS 09/14/2007   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   PONV (postoperative nausea and vomiting)    Protein calorie malnutrition    Rectocele 03/19/2013   Shortness of breath    SUPERFICIAL PHLEBITIS 09/14/2007   Varicose veins    Past Surgical History:  Procedure Laterality Date   ABDOMINAL  HYSTERECTOMY     ANTERIOR AND POSTERIOR REPAIR N/A 04/02/2013   Procedure: ANTERIOR (CYSTOCELE) AND POSTERIOR REPAIR (RECTOCELE);  Surgeon: Norleen LULLA Server, MD;  Location: [facility];  Service: Gynecology;  Laterality: N/A;   APPENDECTOMY     BRAIN SURGERY     CATARACT EXTRACTION W/PHACO  12/05/2011   Procedure: CATARACT EXTRACTION PHACO AND INTRAOCULAR LENS PLACEMENT (IOC);  Surgeon: Cherene Mania;  Location: [facility];  Service: Ophthalmology;  Laterality: Left;  CDE:9.65   CHOLECYSTECTOMY     CRANIOTOMY N/A 06/04/2020   Procedure: Endoscopic Transphenoidal Resection of Recurrent PituitaryTumor;  Surgeon: Colon Shove, MD;  Location: [facility] OR;  Service: Neurosurgery;  Laterality: N/A;   CYSTOSCOPY     ENDOVENOUS ABLATION SAPHENOUS VEIN W/ LASER  11-03-2011   right greater saphenous vein   left leg done 10-2011   EYE SURGERY  98   right cataract extraction 98   LAPAROSCOPIC NISSEN FUNDOPLICATION     NM ESOPHAGEAL REFLUX  08-11-11   PITUITARY EXCISION  10/1997   POSTERIOR REPAIR     TRANSNASAL APPROACH N/A 06/04/2020   Procedure: TRANSNASAL APPROACH;  Surgeon: Mable Lenis, MD;  Location: [facility] OR;  Service: ENT;  Laterality: N/A;   TRANSPHENOIDAL / TRANSNASAL HYPOPHYSECTOMY / RESECTION PITUITARY TUMOR  08-11-11   Social History:  reports that she has never smoked. She has never used smokeless tobacco. She reports that she does not drink alcohol and does not use drugs.  Allergies  Allergen Reactions   Betadine [Povidone Iodine] Hives   Penicillins  Rash    Family History  Problem Relation Age of Onset   Heart disease Mother    Asthma Mother    COPD Father    Heart disease Father    Stroke Father    Asthma Daughter    Migraines Daughter    Neuropathy Son    Cancer Neg Hx    Anesthesia problems Neg Hx    Hypotension Neg Hx    Malignant hyperthermia Neg Hx    Pseudochol deficiency Neg Hx     Prior to Admission medications   Medication Sig Start Date End Date Taking? Authorizing Provider   acetaminophen  (TYLENOL ) 325 MG tablet Take 2 tablets (650 mg total) by mouth every 6 (six) hours as needed for mild pain (pain score 1-3) (or Fever >/= 101). 01/01/24   ShahmehdiAdriana LABOR, MD  albuterol  (VENTOLIN  HFA) 108 (90 Base) MCG/ACT inhaler Inhale 2 puffs into the lungs every 4 (four) hours as needed for wheezing or shortness of breath. 05/01/24   Johnson, Clanford L, MD  aspirin  EC 81 MG tablet Take 81 mg by mouth daily. Swallow whole.    [provider]  atorvastatin  (LIPITOR) 80 MG tablet Take 1 tablet (80 mg total) by mouth daily. 10/22/24   Camara, Amadou, MD  busPIRone (BUSPAR) 5 MG tablet Take 5 mg by mouth 3 (three) times daily as needed (anxiety).    [provider]  cyanocobalamin  (VITAMIN B12) 1000 MCG/ML injection Inject 1,000 mcg into the muscle every 30 (thirty) days. 07/26/24   [provider]  cycloSPORINE  (RESTASIS ) 0.05 % ophthalmic emulsion Place 1 drop into both eyes 2 (two) times daily.    [provider]  dimethicone 1 % cream Apply 1 Application topically 2 (two) times daily as needed for dry skin (pressure injury on sacrum).    [provider]  Estradiol  (IMVEXXY  MAINTENANCE PACK) 10 MCG INST Place 1 suppository vaginally 2 (two) times a week. 10/14/24   [name] POUR, MD  fluticasone  furoate-vilanterol (BREO ELLIPTA ) 200-25 MCG/ACT AEPB Inhale 1 puff into the lungs daily. 10/16/24   Ricky Fines, MD  furosemide  (LASIX ) 20 MG tablet Take 1 tablet (20 mg total) by mouth daily as needed for edema or fluid. 10/16/24 10/16/25  Ricky Fines, MD  ipratropium-albuterol  (DUONEB) 0.5-2.5 (3) MG/3ML SOLN Take 3 mLs by nebulization every 4 (four) hours as needed (wheezing and SOB). 10/16/24   Ricky Fines, MD  levothyroxine  (SYNTHROID ) 50 MCG tablet Take 50 mcg by mouth daily.    [provider]  metFORMIN  (GLUCOPHAGE ) 1000 MG tablet Take 1,000 mg by mouth 2 (two) times daily. 10/01/24   [provider]   midodrine  (PROAMATINE ) 5 MG tablet Take 5 mg by mouth 3 (three) times daily with meals. 06/05/24   [provider]  Multiple Vitamins-Minerals (THERA-M) TABS Take 1 tablet by mouth daily.    [provider]  nitrofurantoin  (MACRODANTIN ) 100 MG capsule Take 1 capsule (100 mg total) by mouth daily. 10/21/24   Ricky Fines, MD  NONFORMULARY OR COMPOUNDED ITEM If patient is incontinent of urine and/or stool, SNF staff are instructed to cleanse patient's genital area at least once every 8 hours and change diaper if soiled. 07/10/24   Gerldine Lauraine BROCKS, FNP  NONFORMULARY OR COMPOUNDED ITEM SNF staff are instructed to encourage hydration with water  at least once every 2-4 hours while patient is awake. 07/10/24   Larocco, Sarah C, FNP  NONFORMULARY OR COMPOUNDED ITEM SNF staff are instructed to prompt toileting and  assist patient with ambulation to toilet at least once every 4-6 hours while awake. 07/10/24   Larocco, Sarah C, FNP  omeprazole (PRILOSEC) 20 MG capsule Take 1 capsule (20 mg total) by mouth 2 (two) times daily before a meal. 10/16/24   Ricky Fines, MD  polyethylene glycol (MIRALAX  / GLYCOLAX ) 17 g packet Take 17 g by mouth daily as needed for mild constipation. 05/01/24   Johnson, Clanford L, MD  potassium chloride  (KLOR-CON ) 10 MEQ tablet Take 10 mEq by mouth daily. 05/15/24   [provider]  predniSONE  (DELTASONE ) 20 MG tablet Take 2 tablets by mouth daily X 2 days; then 1 tablet by mouth daily X 3 days, then 1/2 tablet by mouth daily X 4 days and then start prednisone  5mg  daily (1 week). Patient not taking: Reported on 10/28/2024 10/16/24   Ricky Fines, MD  predniSONE  (DELTASONE ) 5 MG tablet Take 1 tablet (5 mg total) by mouth daily with breakfast. 10/24/24   Ricky Fines, MD  pregabalin  (LYRICA ) 150 MG capsule Take 1 capsule (150 mg total) by mouth 2 (two) times daily. 03/19/24   Pearlean Manus, MD  rizatriptan  (MAXALT -MLT) 10 MG disintegrating tablet Take 10 mg  by mouth as needed for migraine. 09/12/24   [provider]  tiZANidine (ZANAFLEX) 2 MG tablet Take 2 mg by mouth 2 (two) times daily as needed for muscle spasms. 06/21/24   [provider]  traZODone  (DESYREL ) 50 MG tablet Take 1 tablet (50 mg total) by mouth at bedtime as needed for sleep. 03/19/24   Pearlean Manus, MD  TRINTELLIX  10 MG TABS tablet Take 10 mg by mouth daily. 03/09/24   [provider]  umeclidinium bromide  (INCRUSE ELLIPTA ) 62.5 MCG/ACT AEPB Inhale 1 puff into the lungs daily. 10/16/24   Ricky Fines, MD  venlafaxine  (EFFEXOR -XR) 150 MG 24 hr capsule Take 150 mg by mouth every morning.    [provider]    Physical Exam: Vitals:   11/08/24 1845 11/08/24 1900 11/08/24 1915 11/08/24 1937  BP: 106/60 110/71 111/68   Pulse: 92 93 92   Resp: 19 (!) 21 11   Temp:    97.6 F (36.4 C)  TempSrc:    Oral  SpO2: 100% 98% 100%   Weight:      Height:       General: Elderly female. Awake and alert and oriented x3. No acute cardiopulmonary distress.  HEENT: Normocephalic atraumatic.  Right and left ears normal in appearance.  Pupils equal, round, reactive to light. Extraocular muscles are intact. Sclerae anicteric and noninjected.  Moist mucosal membranes. No mucosal lesions.  Neck: Neck supple without lymphadenopathy. No carotid bruits. No masses palpated.  Cardiovascular: Regular rate with normal S1-S2 sounds. No murmurs, rubs, gallops auscultated. No JVD.  Respiratory: Good respiratory effort.  Rales scattered throughout.  No wheezing noted..  No accessory muscle use. Abdomen: Soft, nontender, nondistended. Active bowel sounds. No masses or hepatosplenomegaly  Skin: No rashes, lesions, or ulcerations.  Dry, warm to touch. 2+ dorsalis pedis and radial pulses. Musculoskeletal: No calf or leg pain. All major joints not erythematous nontender.  No upper or lower joint deformation.  Good ROM.  No contractures  Psychiatric: Intact judgment and  insight. Pleasant and cooperative. Neurologic: No focal neurological deficits. Strength is 5/5 and symmetric in upper and lower extremities.  Cranial nerves II through XII are grossly intact.  Data Reviewed: Labs and imaging reviewed by me.  Assessment and Plan: No notes have been filed under this hospital  service. Service: Hospitalist  Active Problems:   Hypothyroidism   Essential hypertension   Type 2 diabetes mellitus with peripheral neuropathy (HCC)   Chronic neck pain   CAP (community acquired pneumonia)  Multifocal pneumonia Procalcitonin Admit Antibiotics: Rocephin  and azithromycin  Robitussin Blood cultures drawn in the emergency department Sputum cultures CBC tomorrow Strep and Legionella antigen by urine Influenza screen  Should follow up with pulmonology as PNA not completely resolving. Hypertension Continue antihypertensives Type 2 diabetes Hold metformin  Sliding scale insulin  Chronic neck pain Zanaflex for pain    Advance Care Planning:   Code Status: Limited: Do not attempt resuscitation (DNR) -DNR-LIMITED -Do Not Intubate/DNI confirmed by patient  Consults: None  Family Communication: Daughter present during interview and exam  Severity of Illness: The appropriate patient status for this patient is INPATIENT. Inpatient status is judged to be reasonable and necessary in order to provide the required intensity of service to ensure the patient's safety. The patient's presenting symptoms, physical exam findings, and initial radiographic and laboratory data in the context of their chronic comorbidities is felt to place them at high risk for further clinical deterioration. Furthermore, it is not anticipated that the patient will be medically stable for discharge from the hospital within 2 midnights of admission.   * I certify that at the point of admission it is my clinical judgment that the patient will require inpatient hospital care spanning beyond 2 midnights  from the point of admission due to high intensity of service, high risk for further deterioration and high frequency of surveillance required.*  Author: Ilhan Debenedetto J Aravind Chrismer, DO 11/08/2024 7:44 PM  For on call review [email or web address].

## 2024-11-09 ENCOUNTER — Other Ambulatory Visit: Payer: Self-pay

## 2024-11-09 DIAGNOSIS — J181 Lobar pneumonia, unspecified organism: Secondary | ICD-10-CM | POA: Insufficient documentation

## 2024-11-09 DIAGNOSIS — Z794 Long term (current) use of insulin: Secondary | ICD-10-CM | POA: Diagnosis not present

## 2024-11-09 DIAGNOSIS — E1165 Type 2 diabetes mellitus with hyperglycemia: Secondary | ICD-10-CM

## 2024-11-09 DIAGNOSIS — G9341 Metabolic encephalopathy: Secondary | ICD-10-CM

## 2024-11-09 DIAGNOSIS — A419 Sepsis, unspecified organism: Secondary | ICD-10-CM | POA: Diagnosis not present

## 2024-11-09 LAB — BASIC METABOLIC PANEL WITH GFR
Anion gap: 12 (ref 5–15)
BUN: 14 mg/dL (ref 8–23)
CO2: 25 mmol/L (ref 22–32)
Calcium: 8.4 mg/dL — ABNORMAL LOW (ref 8.9–10.3)
Chloride: 102 mmol/L (ref 98–111)
Creatinine, Ser: 0.59 mg/dL (ref 0.44–1.00)
GFR, Estimated: >60 mL/min (ref >60)
Glucose, Bld: 98 mg/dL (ref 70–99)
Potassium: 4.2 mmol/L (ref 3.5–5.1)
Sodium: 139 mmol/L (ref 135–145)

## 2024-11-09 LAB — GLUCOSE, CAPILLARY
Glucose-Capillary: 98 mg/dL (ref 70–99)
Glucose-Capillary: 105 mg/dL — ABNORMAL HIGH (ref 70–99)
Glucose-Capillary: 139 mg/dL — ABNORMAL HIGH (ref 70–99)
Glucose-Capillary: 130 mg/dL — ABNORMAL HIGH (ref 70–99)

## 2024-11-09 LAB — CBC
HCT: 30.7 % — ABNORMAL LOW (ref 36.0–46.0)
Hemoglobin: 9.9 g/dL — ABNORMAL LOW (ref 12.0–15.0)
MCH: 28.2 pg (ref 26.0–34.0)
MCHC: 32.2 g/dL (ref 30.0–36.0)
MCV: 87.5 fL (ref 80.0–100.0)
Platelets: 215 K/uL (ref 150–400)
RBC: 3.51 MIL/uL — ABNORMAL LOW (ref 3.87–5.11)
RDW: 18.6 % — ABNORMAL HIGH (ref 11.5–15.5)
WBC: 13.3 K/uL — ABNORMAL HIGH (ref 4.0–10.5)
nRBC: 0 % (ref 0.0–0.2)

## 2024-11-09 LAB — STREP PNEUMONIAE URINARY ANTIGEN: Strep Pneumo Urinary Antigen: NEGATIVE

## 2024-11-09 LAB — HEMOGLOBIN A1C
Hgb A1c MFr Bld: 6.5 % — ABNORMAL HIGH (ref 4.8–5.6)
Mean Plasma Glucose: 139.85 mg/dL

## 2024-11-09 MED ORDER — BUDESONIDE 0.5 MG/2ML IN SUSP
0.5000 mg | Freq: Two times a day (BID) | RESPIRATORY_TRACT | Status: DC
  Administered 2024-11-09 – 2024-11-12 (×7): 0.5 mg via RESPIRATORY_TRACT
  Filled 2024-11-09 (×7): qty 2

## 2024-11-09 MED ORDER — IPRATROPIUM-ALBUTEROL 0.5-2.5 (3) MG/3ML IN SOLN
3.0000 mL | Freq: Three times a day (TID) | RESPIRATORY_TRACT | Status: AC
  Administered 2024-11-10 (×3): 3 mL via RESPIRATORY_TRACT
  Filled 2024-11-09 (×3): qty 3

## 2024-11-09 MED ORDER — IPRATROPIUM-ALBUTEROL 0.5-2.5 (3) MG/3ML IN SOLN
3.0000 mL | Freq: Three times a day (TID) | RESPIRATORY_TRACT | Status: DC
Start: 2024-11-09 — End: 2024-11-09
  Administered 2024-11-09 (×3): 3 mL via RESPIRATORY_TRACT
  Filled 2024-11-09 (×3): qty 3

## 2024-11-09 MED ORDER — ENSURE PLUS HIGH PROTEIN PO LIQD
237.0000 mL | Freq: Two times a day (BID) | ORAL | Status: DC
  Administered 2024-11-09 – 2024-11-12 (×5): 237 mL via ORAL

## 2024-11-09 MED ORDER — LACTATED RINGERS IV SOLN: INTRAVENOUS | Status: AC

## 2024-11-09 MED ORDER — ARFORMOTEROL TARTRATE 15 MCG/2ML IN NEBU
15.0000 ug | INHALATION_SOLUTION | Freq: Two times a day (BID) | RESPIRATORY_TRACT | Status: DC
  Administered 2024-11-09 – 2024-11-12 (×7): 15 ug via RESPIRATORY_TRACT
  Filled 2024-11-09 (×7): qty 2

## 2024-11-09 NOTE — Hospital Course (Addendum)
 83 year old female with a history of pituitary adenoma, diabetes mellitus type 2, stroke, hypertension, COPD, hypothyroidism, hyperlipidemia, anxiety, PSVT presenting with coughing, chest congestion, shortness of breath.  She was noted to be more confused than usual.  She was seen by her PCP who sent her to the emergency department for further evaluation and treatment.  History is limited secondary to the patient's confusion.  She was most recently hospitalized from 10/11/2024 to 10/16/2024.  She was treated for COPD exacerbation and pneumonia at that time.  She was discharged with cefdinir  and doxycycline for 4 additional days.  The patient was admitted to the hospital from 05/22/2024 to 05/28/2024 for severe sepsis due to pneumonia and VRE UTI.  She was also treated for COPD exacerbation and NKHS.   In the ED, the patient was afebrile but soft blood pressures.  WBC 14.4, hemoglobin 10.5, platelets 220.  Sodium 139, potassium 4.2, bicarbonate 25, serum creatinine 0.59.  Lactic acid 2.5>> 2.1.  Troponin 26>> 21.  COVID-19 PCR is negative.  CT of the chest showed multifocal GGO bilateral with small foci of consolidation.  There was mild mediastinal lymphadenopathy.  She was started on ceftriaxone  and azithromycin .

## 2024-11-09 NOTE — Plan of Care (Signed)

## 2024-11-09 NOTE — TOC Initial Note (Signed)
 Transition of Care [facility]) - Initial/Assessment Note    Patient Details  Name: [name] White MRN: [id number] Date of Birth: 11/06/1941  Transition of Care [facility]) CM/SW Contact:    Noreen KATHEE Pinal, LCSWA Phone Number: 11/09/2024, 9:15 AM  Clinical Narrative:                  Patient is high risk for readmission , due to high admission score. Patient was admitted for Community acquired pneumonia. CSW spoke with daughter and she reports that patient is a LT resident at [facility] and have been there since May of 2025. Daughter reports that last admission she does not feel that patient was medically ready to DC because she still was not feeling well and have the same cough. Daughter reports that staff assist patient with all her ADL's at ALF, and that she has a walker, WC, and nebulizer machine. She reports that patient has PT and RN currently through [employer] serviced agency. ICM will continue to follow.    Expected Discharge Plan: Assisted Living Barriers to Discharge: Continued Medical Work up   Patient Goals and CMS Choice Patient states their goals for this hospitalization and ongoing recovery are:: [email or web address] Compare Post Acute Care list provided to:: Patient Represenative (must comment) (Daughter - Olam) Choice offered to / list presented to : Adult Children      Expected Discharge Plan and Services     Post Acute Care Choice: Durable Medical Equipment Living arrangements for the past 2 months: Assisted Living Facility                           HH Arranged: RN, PT       Representative spoke with at [facility] Agency: [facility] has own PT and RN services  Prior Living Arrangements/Services Living arrangements for the past 2 months: Assisted Living Facility Lives with:: Facility Resident Patient language and need for interpreter reviewed:: Yes Do you feel safe going back to the place where you live?: Yes      Need for Family Participation in  Patient Care: Yes (Comment) Care giver support system in place?: Yes (comment) Current home services: DME Criminal Activity/Legal Involvement Pertinent to Current Situation/Hospitalization: No - Comment as needed  Activities of Daily Living   ADL Screening (condition at time of admission) Independently performs ADLs?: No Does the patient have a NEW difficulty with bathing/dressing/toileting/self-feeding that is expected to last >3 days?: Yes (Initiates electronic notice to provider for possible OT consult) Does the patient have a NEW difficulty with getting in/out of bed, walking, or climbing stairs that is expected to last >3 days?: Yes (Initiates electronic notice to provider for possible PT consult) Does the patient have a NEW difficulty with communication that is expected to last >3 days?: No Is the patient deaf or have difficulty hearing?: No Does the patient have difficulty seeing, even when wearing glasses/contacts?: Yes Does the patient have difficulty concentrating, remembering, or making decisions?: No  Permission Sought/Granted      Share Information with NAME: Olam     Permission granted to share info w Relationship: Daughter     Emotional Assessment Appearance:: Appears stated age     Orientation: : Oriented to Self Alcohol / Substance Use: Not Applicable Psych Involvement: No (comment)  Admission diagnosis:  CAP (community acquired pneumonia) [J18.9] Multifocal pneumonia [J18.8] Patient Active Problem List   Diagnosis Date Noted   CAP (community acquired pneumonia) 10/11/2024  Abnormal gait 07/10/2024   Cervical radiculopathy 07/10/2024   Chronic neck pain 07/10/2024   Visual disturbance 07/10/2024   Recurrent UTI 07/09/2024   Hypomagnesemia 05/01/2024   Acute respiratory failure with hypoxemia (HCC) 04/24/2024   CVA (cerebral vascular accident) (HCC) 03/12/2024   Anxiety and depression 03/12/2024   GERD without esophagitis 03/12/2024   Rhabdomyolysis  12/27/2023   Pressure injury of skin 12/27/2023   Status post transsphenoidal pituitary resection 06/04/2020   Pituitary adenoma with extrasellar extension (HCC) 06/04/2020   Pituitary tumor 05/29/2020   Dizzinesses 02/02/2020   Vitamin D  deficiency 01/28/2020   Paroxysmal supraventricular tachycardia 06/19/2014   Rectocele 03/19/2013   Constipation 03/19/2013   HYPERCHOLESTEROLEMIA 02/09/2009   Essential hypertension 02/09/2009   Osteoporosis 02/09/2009   Post-menopausal 02/09/2009   HEAT INTOLERANCE 02/01/2008   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   Hypothyroidism 09/14/2007   Migraine headache 09/14/2007   SUPERFICIAL PHLEBITIS 09/14/2007   MENOPAUSAL SYNDROME 09/14/2007   FATIGUE 09/14/2007   Type 2 diabetes mellitus with peripheral neuropathy (HCC) 09/14/2007   PCP:  [name], MD Pharmacy:   [facility], [place] - [address] [place] [address] Phone: [phone] Fax: [phone]  [facility] - [place], [place] - [address], [address], [address] [place] [address] Phone: [phone] Fax: [phone]     Social Drivers of Health (SDOH) Social History: SDOH Screenings   Food Insecurity: No Food Insecurity (11/08/2024)  Housing: Low Risk  (11/09/2024)  Transportation Needs: No Transportation Needs (11/08/2024)  Utilities: Not At Risk (11/08/2024)  Depression (PHQ2-9): Low Risk  (10/28/2024)  Social Connections: Socially Isolated (11/08/2024)  Tobacco Use: Low Risk  (11/08/2024)   SDOH Interventions:     Readmission Risk Interventions    11/09/2024    9:13 AM 10/12/2024   10:32 AM 05/23/2024   10:18 AM  Readmission Risk Prevention Plan  Transportation Screening Complete Complete Complete  HRI or Home Care Consult  Complete Complete  Social Work Consult for Recovery Care Planning/Counseling  Complete Complete  Palliative Care Screening  Not Applicable Not Applicable  Medication Review Special Educational Needs Teacher) Complete Complete Complete  HRI or Home Care Consult Complete    SW Recovery Care/Counseling Consult Complete    Palliative Care Screening Not Applicable    Skilled Nursing Facility Not Applicable

## 2024-11-09 NOTE — Progress Notes (Signed)
 PROGRESS NOTE  [name] White FMW:[id number] DOB: 12/22/1941 DOA: 11/08/2024 PCP: [name], MD  Brief History:  83 year old female with a history of pituitary adenoma, diabetes mellitus type 2, stroke, hypertension, COPD, hypothyroidism, hyperlipidemia, anxiety, PSVT presenting with coughing, chest congestion, shortness of breath.  She was noted to be more confused than usual.  She was seen by her PCP who sent her to the emergency department for further evaluation and treatment.  History is limited secondary to the patient's confusion.  She was most recently hospitalized from 10/11/2024 to 10/16/2024.  She was treated for COPD exacerbation and pneumonia at that time.  She was discharged with cefdinir  and doxycycline for 4 additional days.  The patient was admitted to the hospital from 05/22/2024 to 05/28/2024 for severe sepsis due to pneumonia and VRE UTI.  She was also treated for COPD exacerbation and NKHS.   In the ED, the patient was afebrile but soft blood pressures.  WBC 14.4, hemoglobin 10.5, platelets 220.  Sodium 139, potassium 4.2, bicarbonate 25, serum creatinine 0.59.  Lactic acid 2.5>> 2.1.  Troponin 26>> 21.  COVID-19 PCR is negative.  CT of the chest showed multifocal GGO bilateral with small foci of consolidation.  There was mild mediastinal lymphadenopathy.  She was started on ceftriaxone  and azithromycin .   Assessment/Plan: Sepsis - Present on admission - Presented with tachycardia and leukocytosis - Secondary to pneumonia - Lactic acid 2.5>> 2.1 - Follow blood cultures - Continue ceftriaxone  and azithromycin  - COVID-19/RSV/flu negative  Lobar pneumonia - Continue ceftriaxone  and azithromycin  - Speech therapy evaluation - MRSA screen  COPD - Start Brovana  - Start DuoNebs - Start Pulmicort   Acute metabolic encephalopathy - Secondary to infectious process  Diabetes mellitus type 2 with hyperglycemia - NovoLog  sliding scale - Holding metformin  -  07/02/2024 hemoglobin A1c 9.8  Mood disorder/anxiety/depression - Continue Effexor  and Trintellix   Mixed hyperlipidemia - Continue statin  Chronic pain - Continue Lyrica    History of stroke - Continue aspirin , statin  Hypothyroidism - Continue Synthroid      Family Communication: no  Family at bedside  Consultants:  none  Code Status:  DNR  DVT Prophylaxis:  [place] Lovenox    Procedures: As Listed in Progress Note Above  Antibiotics: Ceftriaxone  11/14>> Azithro 11/14>>    Subjective:  Patient is awake and alert.  She is pleasantly confused.  Chest pain, shortness breath, abdominal pain. Objective: Vitals:   11/09/24 0300 11/09/24 0754 11/09/24 0824 11/09/24 1306  BP: (!) 97/59  (!) 102/55 106/63  Pulse: (!) 110  (!) 101 94  Resp: 18  19 17   Temp: 98.4 F (36.9 C)  99.2 F (37.3 C)   TempSrc: Oral  Oral   SpO2: 92% 97% 96% 98%  Weight:      Height:        Intake/Output Summary (Last 24 hours) at 11/09/2024 1347 Last data filed at 11/09/2024 0435 Gross per 24 hour  Intake --  Output 350 ml  Net -350 ml   Weight change:  Exam:  General:  Pt is alert, follows commands appropriately, not in acute distress HEENT: No icterus, No thrush, No neck mass, [place]/AT Cardiovascular: RRR, S1/S2, no rubs, no gallops Respiratory: Bibasilar rales.  Min basilar wheezing Abdomen: Soft/+BS, non tender, non distended, no guarding Extremities: No edema, No lymphangitis, No petechiae, No rashes, no synovitis   Data Reviewed: I have personally reviewed following labs and imaging studies Basic Metabolic Panel: Recent Labs  Lab 11/08/24 1329 11/09/24 0428  NA 135 139  K 4.7 4.2  CL 96* 102  CO2 26 25  GLUCOSE 216* 98  BUN 17 14  CREATININE 0.69 0.59  CALCIUM  9.0 8.4*   Liver Function Tests: Recent Labs  Lab 11/08/24 1329  AST 36  ALT 40  ALKPHOS 49  BILITOT 1.0  PROT 6.8  ALBUMIN 4.1   No results for input(s): LIPASE, AMYLASE in the last 168  hours. No results for input(s): AMMONIA in the last 168 hours. Coagulation Profile: No results for input(s): INR, PROTIME in the last 168 hours. CBC: Recent Labs  Lab 11/08/24 1329 11/09/24 0428  WBC 14.4* 13.3*  NEUTROABS 12.5*  --   HGB 10.5* 9.9*  HCT 32.4* 30.7*  MCV 89.8 87.5  PLT 228 215   Cardiac Enzymes: No results for input(s): CKTOTAL, CKMB, CKMBINDEX, TROPONINI in the last 168 hours. BNP: Invalid input(s): POCBNP CBG: Recent Labs  Lab 11/08/24 2151 11/09/24 0712 11/09/24 1106  GLUCAP 146* 98 105*   HbA1C: No results for input(s): HGBA1C in the last 72 hours. Urine analysis:    Component Value Date/Time   COLORURINE YELLOW 05/29/2024 1619   APPEARANCEUR Cloudy (A) 10/02/2024 1005   LABSPEC 1.015 05/29/2024 1619   PHURINE 5.0 05/29/2024 1619   GLUCOSEU Negative 10/02/2024 1005   GLUCOSEU >=1000 (A) 06/25/2020 1441   HGBUR LARGE (A) 05/29/2024 1619   BILIRUBINUR Negative 10/02/2024 1005   KETONESUR NEGATIVE 05/29/2024 1619   PROTEINUR 1+ (A) 10/02/2024 1005   PROTEINUR NEGATIVE 05/29/2024 1619   UROBILINOGEN 0.2 06/25/2020 1441   NITRITE Negative 10/02/2024 1005   NITRITE NEGATIVE 05/29/2024 1619   LEUKOCYTESUR 3+ (A) 10/02/2024 1005   LEUKOCYTESUR SMALL (A) 05/29/2024 1619   Sepsis Labs: @LABRCNTIP (procalcitonin:4,lacticidven:4) ) Recent Results (from the past 240 hours)  Culture, blood (routine x 2)     Status: None (Preliminary result)   Collection Time: 11/08/24  4:27 PM   Specimen: BLOOD  Result Value Ref Range Status   Specimen Description BLOOD BLOOD RIGHT ARM  Final   Special Requests   Final    BOTTLES DRAWN AEROBIC AND ANAEROBIC Blood Culture adequate volume   Culture   Final    NO GROWTH < 24 HOURS Performed at [facility], [address], [place], [place] [address]    Report Status PENDING  Incomplete  Culture, blood (routine x 2)     Status: None (Preliminary result)   Collection Time: 11/08/24  4:27 PM    Specimen: BLOOD  Result Value Ref Range Status   Specimen Description BLOOD BLOOD LEFT ARM  Final   Special Requests   Final    BOTTLES DRAWN AEROBIC AND ANAEROBIC Blood Culture adequate volume   Culture   Final    NO GROWTH < 24 HOURS Performed at [facility], [address] [place], [place] [address]    Report Status PENDING  Incomplete  Resp panel by RT-PCR (RSV, Flu A&B, Covid) Anterior Nasal Swab     Status: None   Collection Time: 11/08/24 10:47 PM   Specimen: Anterior Nasal Swab  Result Value Ref Range Status   SARS Coronavirus 2 by RT PCR NEGATIVE NEGATIVE Final    Comment: (NOTE) SARS-CoV-2 target nucleic acids are NOT DETECTED.  The SARS-CoV-2 RNA is generally detectable in upper respiratory specimens during the acute phase of infection. The lowest concentration of SARS-CoV-2 viral copies this assay can detect is 138 copies/mL. A negative result does not preclude SARS-Cov-2 infection and  should not be used as the sole basis for treatment or other patient management decisions. A negative result may occur with  improper specimen collection/handling, submission of specimen other than nasopharyngeal swab, presence of viral mutation(s) within the areas targeted by this assay, and inadequate number of viral copies(<138 copies/mL). A negative result must be combined with clinical observations, patient history, and epidemiological information. The expected result is Negative.  Fact Sheet for Patients:  bloggercourse.com  Fact Sheet for Healthcare Providers:  seriousbroker.it  This test is no t yet approved or cleared by the [place]  FDA and  has been authorized for detection and/or diagnosis of SARS-CoV-2 by FDA under an Emergency Use Authorization (EUA). This EUA will remain  in effect (meaning this test can be used) for the duration of the COVID-19 declaration under Section 564(b)(1) of the Act, 21 U.S.C.section  360bbb-3(b)(1), unless the authorization is terminated  or revoked sooner.       Influenza A by PCR NEGATIVE NEGATIVE Final   Influenza B by PCR NEGATIVE NEGATIVE Final    Comment: (NOTE) The Xpert Xpress SARS-CoV-2/FLU/RSV plus assay is intended as an aid in the diagnosis of influenza from Nasopharyngeal swab specimens and should not be used as a sole basis for treatment. Nasal washings and aspirates are unacceptable for Xpert Xpress SARS-CoV-2/FLU/RSV testing.  Fact Sheet for Patients: bloggercourse.com  Fact Sheet for Healthcare Providers: seriousbroker.it  This test is not yet approved or cleared by the [place]  FDA and has been authorized for detection and/or diagnosis of SARS-CoV-2 by FDA under an Emergency Use Authorization (EUA). This EUA will remain in effect (meaning this test can be used) for the duration of the COVID-19 declaration under Section 564(b)(1) of the Act, 21 U.S.C. section 360bbb-3(b)(1), unless the authorization is terminated or revoked.     Resp Syncytial Virus by PCR NEGATIVE NEGATIVE Final    Comment: (NOTE) Fact Sheet for Patients: bloggercourse.com  Fact Sheet for Healthcare Providers: seriousbroker.it  This test is not yet approved or cleared by the [place]  FDA and has been authorized for detection and/or diagnosis of SARS-CoV-2 by FDA under an Emergency Use Authorization (EUA). This EUA will remain in effect (meaning this test can be used) for the duration of the COVID-19 declaration under Section 564(b)(1) of the Act, 21 U.S.C. section 360bbb-3(b)(1), unless the authorization is terminated or revoked.  Performed at [facility], [address] [place], [place] [address]      Scheduled Meds:  arformoterol   15 mcg Nebulization BID   aspirin  EC  81 mg Oral Daily   atorvastatin   80 mg Oral Daily   budesonide  (PULMICORT )  nebulizer solution  0.5 mg Nebulization BID   enoxaparin  (LOVENOX ) injection  40 mg Subcutaneous QHS   guaiFENesin   600 mg Oral BID   insulin  aspart  0-15 Units Subcutaneous TID WC   insulin  aspart  0-5 Units Subcutaneous QHS   ipratropium-albuterol   3 mL Nebulization Q8H   levothyroxine   50 mcg Oral Q0600   pregabalin   150 mg Oral BID   venlafaxine  XR  150 mg Oral Daily   vortioxetine  HBr  10 mg Oral Daily   Continuous Infusions:  sodium chloride  75 mL/hr at 11/09/24 9167   azithromycin      cefTRIAXone  (ROCEPHIN )  IV      Procedures/Studies: CT Chest Wo Contrast Result Date: 11/08/2024 CLINICAL DATA:  Fever nonproductive cough EXAM: CT CHEST WITHOUT CONTRAST TECHNIQUE: Multidetector CT imaging of the chest was performed following the standard protocol without IV  contrast. RADIATION DOSE REDUCTION: This exam was performed according to the departmental dose-optimization program which includes automated exposure control, adjustment of the mA and/or kV according to patient size and/or use of iterative reconstruction technique. COMPARISON:  Chest x-ray 11/08/2024, chest CT 10/11/2024, 05/22/2024 FINDINGS: Cardiovascular: Limited assessment without intravenous contrast. Mild aortic atherosclerosis. No aneurysm. Mitral calcification. Mild aortic valvular calcification. Normal cardiac size. No pericardial effusion Mediastinum/Nodes: Patent trachea. No thyroid  mass. Mildly prominent precarinal node measuring 11 mm. Limited assessment for hilar nodes in the absence of contrast. Subcarinal lymph node measures 14 mm. Esophagus within normal limits. Lungs/Pleura: Trace right-sided pleural effusion. Multifocal bilateral ground-glass densities and small foci of consolidation. Upper Abdomen: No acute finding. Stable 11 mm left adrenal nodule consistent with adenoma. Musculoskeletal: Moderate superior endplate deformity T12, stable compared with CT earlier this month, new compared with CT from May. Suspicion of  mild paravertebral hazy density as may be seen with subacute fracture. IMPRESSION: 1. Multifocal bilateral ground-glass densities and small foci of consolidation, suspect for multifocal pneumonia. Trace right-sided pleural effusion. 2. Mildly prominent mediastinal lymph nodes, likely reactive. 3. Moderate superior endplate deformity at T12, stable compared with CT earlier this month, new compared with CT from May. Suspicion of mild paravertebral hazy density as may be seen with subacute fracture. 4. Aortic atherosclerosis. Aortic Atherosclerosis (ICD10-I70.0). Electronically Signed   By: Luke Bun M.D.   On: 11/08/2024 18:03   DG Chest Portable 1 View Result Date: 11/08/2024 EXAM: 1 VIEW(S) XRAY OF THE CHEST 11/08/2024 04:05:00 PM COMPARISON: 10/11/2024 CLINICAL HISTORY: SOB, cough. FINDINGS: LUNGS AND PLEURA: Stable minimal interstitial densities are noted which may represent scarring but minimal pulmonary edema cannot be excluded. Minimal pleural effusions may be present. No pneumothorax. HEART AND MEDIASTINUM: Stable cardiomediastinal silhouette. BONES AND SOFT TISSUES: No acute osseous abnormality. IMPRESSION: 1. Stable minimal interstitial densities, possibly representing scarring or minimal pulmonary edema. 2. Stable minimal pleural effusions. Electronically signed by: Lynwood Seip MD 11/08/2024 04:18 PM EST RP Workstation: [id number]   DG BONE DENSITY (DXA) Result Date: 10/30/2024 EXAM: DUAL X-RAY ABSORPTIOMETRY (DXA) FOR BONE MINERAL DENSITY 10/30/2024 4:39 pm CLINICAL DATA:  83 year old Female Postmenopausal. Hypoestrogen needs dxa Patient is or has been on glucocorticoid therapy. Patient is or has been on bone building therapies. TECHNIQUE: An axial (e.g., hips, spine) and/or appendicular (e.g., radius) exam was performed, as appropriate, using [employer] Secretary/administrator at [employer]. Images are obtained for bone mineral density measurement and are not obtained for diagnostic  purposes. [id number] Exclusions: None. COMPARISON:  None. New baseline. FINDINGS: Scan quality: Good. LUMBAR SPINE (L1-L4): BMD (in g/cm2): 1.144 T-score: -0.3 Z-score: 1.6 LEFT FEMORAL NECK: BMD (in g/cm2): 0.962 T-score: -0.5 Z-score: 1.7 LEFT TOTAL HIP: BMD (in g/cm2): 0.970 T-score: -0.3 Z-score: 1.9 RIGHT FEMORAL NECK: BMD (in g/cm2): 1.016 T-score: -0.2 Z-score: 2.1 RIGHT TOTAL HIP: BMD (in g/cm2): 0.994 T-score: -0.1 Z-score: 2.1 FRAX 10-YEAR PROBABILITY OF FRACTURE: FRAX not reported as the lowest BMD is not in the osteopenia range. IMPRESSION: Normal based on BMD. Fracture risk is unknown due to history of bone building therapy. RECOMMENDATIONS: 1. All patients should optimize calcium  and vitamin D  intake. 2. Consider FDA-approved medical therapies in postmenopausal women and men aged 74 years and older, based on the following: - A hip or vertebral (clinical or morphometric) fracture - T-score less than or equal to -2.5 and secondary causes have been excluded. - Low bone mass (T-score between -1.0 and -2.5) and a 10-year probability of a  hip fracture greater than or equal to 3% or a 10-year probability of a major osteoporosis-related fracture greater than or equal to 20% based on the [place] -adapted WHO algorithm. - Clinician judgment and/or patient preferences may indicate treatment for people with 10-year fracture probabilities above or below these levels 3. Patients with diagnosis of osteoporosis or at high risk for fracture should have regular bone mineral density tests. For patients eligible for Medicare, routine testing is allowed once every 2 years. The testing frequency can be increased to one year for patients who have rapidly progressing disease, those who are receiving or discontinuing medical therapy to restore bone mass, or have additional risk factors. Electronically Signed   By: Dina  Arceo M.D.   On: 10/30/2024 19:02   MR BRAIN W WO CONTRAST Result Date: 10/25/2024 EXAM: MRI BRAIN WITH AND  WITHOUT CONTRAST 10/25/2024 12:06:05 PM TECHNIQUE: Multiplanar multisequence MRI of the head/brain was performed with and without the administration of intravenous contrast. COMPARISON: 03/11/2024 and 10/28/2022 brain MRI. CLINICAL HISTORY: Brain/CNS neoplasm, assess treatment response. FINDINGS: BRAIN AND VENTRICLES: No acute infarct. No acute intracranial hemorrhage. No mass effect or midline shift. No hydrocephalus. Normal flow voids. No mass or abnormal enhancement. Old bilateral cerebellar infarcts. Old small vessel infarcts of the corona radiata and centrum semiovale. Generalized volume loss. Multifocal hyperintense T2-weighted signal within the cerebral white matter, most commonly due to chronic small vessel disease. 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 PITUITARY: Intrasellar/suprasellar mass measures 2.4 x 1.8 x 1.5 cm (AP x CC x Transverse), unchanged compared to 10/28/2022. Mild mass effect on the cisternal segment of the right optic nerve. Grade 3 cavernous sinus invasion on the right. Cavernous carotid flow voids are maintained. The infundibulum is deviated superiorly. ORBITS: Ocular lens replacements. No acute abnormality. SINUSES: No acute abnormality. BONES AND SOFT TISSUES: Normal bone marrow signal and enhancement. No acute soft tissue abnormality. IMPRESSION: 1. 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 2. Old bilateral cerebellar infarcts and chronic small vessel disease-related white matter changes. Electronically signed by: Franky [place] MD 10/25/2024 01:08 PM EDT RP Workstation: [id number]   CT HEAD WO CONTRAST ( ) Result Date: 10/12/2024 EXAM: CT HEAD WITHOUT CONTRAST 10/12/2024 05:57:18 PM TECHNIQUE: CT of the head was performed without the administration of intravenous  contrast. Automated exposure control, iterative reconstruction, and/or weight based adjustment of the mA/kV was utilized to reduce the radiation dose to as low as reasonably achievable. COMPARISON: Comparison made with prior CT from 07/04/2024 as well as earlier studies. CLINICAL HISTORY: Mental status change, unknown cause. FINDINGS: BRAIN AND VENTRICLES: Patchy hyperdensity involving the supratentorial cerebral white matter consistent with chronic 2-vessel ischemic disease, stable. Few small remote cerebellar infarcts, right greater than left, also stable. No acute intracranial hemorrhage. No visible acute large vessel territory infarct. No hydrocephalus. No extra-axial collection. No mass effect or midline shift. 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 No abnormal hyperdense vessel. Calcified atherosclerosis present about the skull base. ORBITS: No acute abnormality. SINUSES: No acute abnormality. SOFT TISSUES AND SKULL: No acute soft tissue abnormality. No skull fracture. IMPRESSION: 1. No acute intracranial abnormality. 2. 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 3. Chronic microvascular ischemic disease with a few small remote cerebellar infarcts,  stable. Electronically signed by: Morene Hoard MD 10/12/2024 06:33 PM EDT RP Workstation: [id number]   CT Chest Wo Contrast Result Date: 10/11/2024 EXAM: CT CHEST WITHOUT CONTRAST 10/11/2024 01:05:02 PM TECHNIQUE: CT of the chest was performed without the administration of intravenous contrast. Multiplanar reformatted images are provided for review. Automated exposure control, iterative reconstruction, and/or weight based adjustment of the mA/kV was utilized to reduce the radiation dose to as low as reasonably achievable. COMPARISON: 05/22/2024 CLINICAL HISTORY: pna type symptoms. FINDINGS: MEDIASTINUM:  Heart and pericardium are unremarkable. The central airways are clear. Aortic atherosclerosis. LYMPH NODES: No mediastinal, hilar or axillary lymphadenopathy. LUNGS AND PLEURA: Minimal right pleural effusion is noted. Mild left posterior basilar opacity is noted concerning for subsegmental atelectasis or possibly pneumonia. No pneumothorax. SOFT TISSUES/BONES: Moderate compression deformity of T12 vertebral body is noted concerning for fracture of indeterminate age. No acute abnormality of the soft tissues. UPPER ABDOMEN: Limited images of the upper abdomen demonstrates no acute abnormality. IMPRESSION: 1. Mild left posterior basilar opacity, suspicious for subsegmental atelectasis or pneumonia. 2. Minimal right pleural effusion. 3. Moderate T12 vertebral body compression deformity, age indeterminate. Electronically signed by: Lynwood Seip MD 10/11/2024 02:30 PM EDT RP Workstation: [id number]   DG Chest 2 View Result Date: 10/11/2024 EXAM: 2 VIEW(S) XRAY OF THE CHEST 10/11/2024 11:11:51 AM COMPARISON: 05/29/2024 CLINICAL HISTORY: cough. Per Triage: Pt arrived [facility] from [facility] in [place]. Pt has had coughing, nasal congestion and fatigue since yesterday. FINDINGS: LUNGS AND PLEURA: Minimal bibasilar subsegmental atelectasis. Minimal bilateral pleural effusions are noted. No pulmonary edema. No pneumothorax. HEART AND MEDIASTINUM: No acute abnormality of the cardiac and mediastinal silhouettes. BONES AND SOFT TISSUES: No acute osseous abnormality. IMPRESSION: 1. Minimal bilateral pleural effusions. 2. Minimal bibasilar subsegmental atelectasis. Electronically signed by: Lynwood Seip MD 10/11/2024 11:27 AM EDT RP Workstation: [id number]    Alm Schneider, DO  [facility]  If 7PM-7AM, please contact night-coverage [email or web address] Password [email or web address] 11/09/2024, 1:47 PM   LOS: 1 day

## 2024-11-10 DIAGNOSIS — J181 Lobar pneumonia, unspecified organism: Secondary | ICD-10-CM

## 2024-11-10 DIAGNOSIS — A419 Sepsis, unspecified organism: Secondary | ICD-10-CM | POA: Diagnosis not present

## 2024-11-10 DIAGNOSIS — J441 Chronic obstructive pulmonary disease with (acute) exacerbation: Secondary | ICD-10-CM | POA: Insufficient documentation

## 2024-11-10 DIAGNOSIS — G9341 Metabolic encephalopathy: Secondary | ICD-10-CM | POA: Diagnosis not present

## 2024-11-10 DIAGNOSIS — E1165 Type 2 diabetes mellitus with hyperglycemia: Secondary | ICD-10-CM | POA: Diagnosis not present

## 2024-11-10 LAB — GLUCOSE, CAPILLARY
Glucose-Capillary: 89 mg/dL (ref 70–99)
Glucose-Capillary: 145 mg/dL — ABNORMAL HIGH (ref 70–99)
Glucose-Capillary: 175 mg/dL — ABNORMAL HIGH (ref 70–99)
Glucose-Capillary: 205 mg/dL — ABNORMAL HIGH (ref 70–99)

## 2024-11-10 LAB — BASIC METABOLIC PANEL WITH GFR
Anion gap: 10 (ref 5–15)
BUN: 13 mg/dL (ref 8–23)
CO2: 25 mmol/L (ref 22–32)
Calcium: 8.3 mg/dL — ABNORMAL LOW (ref 8.9–10.3)
Chloride: 99 mmol/L (ref 98–111)
Creatinine, Ser: 0.45 mg/dL (ref 0.44–1.00)
GFR, Estimated: >60 mL/min (ref >60)
Glucose, Bld: 81 mg/dL (ref 70–99)
Potassium: 3.8 mmol/L (ref 3.5–5.1)
Sodium: 134 mmol/L — ABNORMAL LOW (ref 135–145)

## 2024-11-10 LAB — CBC
HCT: 28.3 % — ABNORMAL LOW (ref 36.0–46.0)
Hemoglobin: 9.3 g/dL — ABNORMAL LOW (ref 12.0–15.0)
MCH: 29.2 pg (ref 26.0–34.0)
MCHC: 32.9 g/dL (ref 30.0–36.0)
MCV: 88.7 fL (ref 80.0–100.0)
Platelets: 210 K/uL (ref 150–400)
RBC: 3.19 MIL/uL — ABNORMAL LOW (ref 3.87–5.11)
RDW: 18.3 % — ABNORMAL HIGH (ref 11.5–15.5)
WBC: 12.1 K/uL — ABNORMAL HIGH (ref 4.0–10.5)
nRBC: 0 % (ref 0.0–0.2)

## 2024-11-10 LAB — PHOSPHORUS: Phosphorus: 3.1 mg/dL (ref 2.5–4.6)

## 2024-11-10 LAB — LACTIC ACID, PLASMA: Lactic Acid, Venous: 0.6 mmol/L (ref 0.5–1.9)

## 2024-11-10 LAB — PROCALCITONIN: Procalcitonin: 0.1 ng/mL

## 2024-11-10 LAB — MAGNESIUM: Magnesium: 1.6 mg/dL — ABNORMAL LOW (ref 1.7–2.4)

## 2024-11-10 LAB — VITAMIN B12: Vitamin B-12: 291 pg/mL (ref 180–914)

## 2024-11-10 LAB — FOLATE: Folate: >20 ng/mL (ref >5.9)

## 2024-11-10 MED ORDER — LIDOCAINE 5 % EX PTCH
1.0000 | MEDICATED_PATCH | CUTANEOUS | Status: DC
  Administered 2024-11-10 – 2024-11-11 (×2): 1 via TRANSDERMAL
  Filled 2024-11-10 (×2): qty 1

## 2024-11-10 MED ORDER — METHYLPREDNISOLONE SODIUM SUCC 125 MG IJ SOLR
60.0000 mg | Freq: Two times a day (BID) | INTRAMUSCULAR | Status: DC
  Administered 2024-11-10 – 2024-11-12 (×5): 60 mg via INTRAVENOUS
  Filled 2024-11-10 (×5): qty 2

## 2024-11-10 MED ORDER — VORTIOXETINE HBR 5 MG PO TABS
10.0000 mg | ORAL_TABLET | Freq: Every day | ORAL | Status: DC
  Administered 2024-11-11 – 2024-11-12 (×2): 10 mg via ORAL
  Filled 2024-11-10 (×4): qty 2

## 2024-11-10 MED ORDER — ALBUTEROL SULFATE (2.5 MG/3ML) 0.083% IN NEBU
2.5000 mg | INHALATION_SOLUTION | Freq: Three times a day (TID) | RESPIRATORY_TRACT | Status: DC
  Administered 2024-11-11 (×2): 2.5 mg via RESPIRATORY_TRACT
  Filled 2024-11-10 (×2): qty 3

## 2024-11-10 MED ORDER — REVEFENACIN 175 MCG/3ML IN SOLN
175.0000 ug | Freq: Every day | RESPIRATORY_TRACT | Status: DC
  Administered 2024-11-11 – 2024-11-12 (×2): 175 ug via RESPIRATORY_TRACT
  Filled 2024-11-10 (×2): qty 3

## 2024-11-10 MED ORDER — VITAMIN B-12 100 MCG PO TABS
500.0000 ug | ORAL_TABLET | Freq: Every day | ORAL | Status: DC
  Administered 2024-11-10 – 2024-11-12 (×3): 500 ug via ORAL
  Filled 2024-11-10 (×3): qty 5

## 2024-11-10 MED ORDER — MAGNESIUM SULFATE 2 GM/50ML IV SOLN
2.0000 g | Freq: Once | INTRAVENOUS | Status: AC
  Administered 2024-11-10: 2 g via INTRAVENOUS
  Filled 2024-11-10: qty 50

## 2024-11-10 NOTE — Progress Notes (Signed)
 PROGRESS NOTE  [name] White FMW:[id number] DOB: December 01, 1941 DOA: 11/08/2024 PCP: [name], MD  Brief History:  83 year old female with a history of pituitary adenoma, diabetes mellitus type 2, stroke, hypertension, COPD, hypothyroidism, hyperlipidemia, anxiety, PSVT presenting with coughing, chest congestion, shortness of breath.  She was noted to be more confused than usual.  She was seen by her PCP who sent her to the emergency department for further evaluation and treatment.  History is limited secondary to the patient's confusion.  She was most recently hospitalized from 10/11/2024 to 10/16/2024.  She was treated for COPD exacerbation and pneumonia at that time.  She was discharged with cefdinir  and doxycycline for 4 additional days.  The patient was admitted to the hospital from 05/22/2024 to 05/28/2024 for severe sepsis due to pneumonia and VRE UTI.  She was also treated for COPD exacerbation and NKHS.   In the ED, the patient was afebrile but soft blood pressures.  WBC 14.4, hemoglobin 10.5, platelets 220.  Sodium 139, potassium 4.2, bicarbonate 25, serum creatinine 0.59.  Lactic acid 2.5>> 2.1.  Troponin 26>> 21.  COVID-19 PCR is negative.  CT of the chest showed multifocal GGO bilateral with small foci of consolidation.  There was mild mediastinal lymphadenopathy.  She was started on ceftriaxone  and azithromycin .   Assessment/Plan:  Sepsis - Present on admission - Presented with tachycardia and leukocytosis - Secondary to pneumonia - Lactic acid 2.5>> 2.1 - Follow blood cultures--neg to date - Continue ceftriaxone  and azithromycin  - COVID-19/RSV/flu negative   Lobar pneumonia - Continue ceftriaxone  and azithromycin  - Speech therapy evaluation - MRSA screen   COPD - Start Brovana  - Start DuoNebs - Start Pulmicort  - start yupelri  -start IV solumedrol   Acute metabolic encephalopathy - Secondary to infectious process   Diabetes mellitus type 2 with  hyperglycemia - NovoLog  sliding scale - Holding metformin  - 07/02/2024 hemoglobin A1c 9.8   Mood disorder/anxiety/depression - Continue Effexor  and Trintellix    Mixed hyperlipidemia - Continue statin   Chronic pain - Continue Lyrica    History of stroke - Continue aspirin , statin   Hypothyroidism - Continue Synthroid    Hypomagnesemia -replete     Family Communication: daughter at bedside 11/16   Consultants:  none   Code Status:  DNR   DVT Prophylaxis:  [place] Lovenox      Procedures: As Listed in Progress Note Above   Antibiotics: Ceftriaxone  11/14>> Azithro 11/14>>             Subjective: Pt denies f/c, cp, sob, n/v/d, abd pain  Objective: Vitals:   11/10/24 0356 11/10/24 0757 11/10/24 0804 11/10/24 1305  BP: (!) 98/55  (!) 108/59 110/61  Pulse: 91  87 88  Resp: 16  18 18   Temp: 98.1 F (36.7 C)  99.1 F (37.3 C) 98.7 F (37.1 C)  TempSrc: Oral  Oral Oral  SpO2: 95% 96% 100% 98%  Weight:      Height:        Intake/Output Summary (Last 24 hours) at 11/10/2024 1315 Last data filed at 11/10/2024 1300 Gross per 24 hour  Intake 1220.54 ml  Output 400 ml  Net 820.54 ml   Weight change:  Exam:  General:  Pt is alert, follows commands appropriately, not in acute distress HEENT: No icterus, No thrush, No neck mass, [place]/AT Cardiovascular: RRR, S1/S2, no rubs, no gallops Respiratory: bibasilar rales. Bilateral wheeze Abdomen: Soft/+BS, non tender, non distended, no guarding Extremities: No edema, No  lymphangitis, No petechiae, No rashes, no synovitis   Data Reviewed: I have personally reviewed following labs and imaging studies Basic Metabolic Panel: Recent Labs  Lab 11/08/24 1329 11/09/24 0428 11/10/24 0440  NA 135 139 134*  K 4.7 4.2 3.8  CL 96* 102 99  CO2 26 25 25   GLUCOSE 216* 98 81  BUN 17 14 13   CREATININE 0.69 0.59 0.45  CALCIUM  9.0 8.4* 8.3*  MG  --   --  1.6*  PHOS  --   --  3.1   Liver Function Tests: Recent Labs   Lab 11/08/24 1329  AST 36  ALT 40  ALKPHOS 49  BILITOT 1.0  PROT 6.8  ALBUMIN 4.1   No results for input(s): LIPASE, AMYLASE in the last 168 hours. No results for input(s): AMMONIA in the last 168 hours. Coagulation Profile: No results for input(s): INR, PROTIME in the last 168 hours. CBC: Recent Labs  Lab 11/08/24 1329 11/09/24 0428 11/10/24 0440  WBC 14.4* 13.3* 12.1*  NEUTROABS 12.5*  --   --   HGB 10.5* 9.9* 9.3*  HCT 32.4* 30.7* 28.3*  MCV 89.8 87.5 88.7  PLT 228 215 210   Cardiac Enzymes: No results for input(s): CKTOTAL, CKMB, CKMBINDEX, TROPONINI in the last 168 hours. BNP: Invalid input(s): POCBNP CBG: Recent Labs  Lab 11/09/24 1106 11/09/24 1615 11/09/24 2012 11/10/24 0706 11/10/24 1117  GLUCAP 105* 139* 130* 89 145*   HbA1C: Recent Labs    11/09/24 0428  HGBA1C 6.5*   Urine analysis:    Component Value Date/Time   COLORURINE YELLOW 05/29/2024 1619   APPEARANCEUR Cloudy (A) 10/02/2024 1005   LABSPEC 1.015 05/29/2024 1619   PHURINE 5.0 05/29/2024 1619   GLUCOSEU Negative 10/02/2024 1005   GLUCOSEU >=1000 (A) 06/25/2020 1441   HGBUR LARGE (A) 05/29/2024 1619   BILIRUBINUR Negative 10/02/2024 1005   KETONESUR NEGATIVE 05/29/2024 1619   PROTEINUR 1+ (A) 10/02/2024 1005   PROTEINUR NEGATIVE 05/29/2024 1619   UROBILINOGEN 0.2 06/25/2020 1441   NITRITE Negative 10/02/2024 1005   NITRITE NEGATIVE 05/29/2024 1619   LEUKOCYTESUR 3+ (A) 10/02/2024 1005   LEUKOCYTESUR SMALL (A) 05/29/2024 1619   Sepsis Labs: @LABRCNTIP (procalcitonin:4,lacticidven:4) ) Recent Results (from the past 240 hours)  Culture, blood (routine x 2)     Status: None (Preliminary result)   Collection Time: 11/08/24  4:27 PM   Specimen: BLOOD  Result Value Ref Range Status   Specimen Description BLOOD BLOOD RIGHT ARM  Final   Special Requests   Final    BOTTLES DRAWN AEROBIC AND ANAEROBIC Blood Culture adequate volume   Culture   Final    NO GROWTH 2  DAYS Performed at [facility], [address], [place], [place] [address]    Report Status PENDING  Incomplete  Culture, blood (routine x 2)     Status: None (Preliminary result)   Collection Time: 11/08/24  4:27 PM   Specimen: BLOOD  Result Value Ref Range Status   Specimen Description BLOOD BLOOD LEFT ARM  Final   Special Requests   Final    BOTTLES DRAWN AEROBIC AND ANAEROBIC Blood Culture adequate volume   Culture   Final    NO GROWTH 2 DAYS Performed at [facility], [address], [place], [place] [address]    Report Status PENDING  Incomplete  Resp panel by RT-PCR (RSV, Flu A&B, Covid) Anterior Nasal Swab     Status: None   Collection Time: 11/08/24 10:47 PM   Specimen: Anterior Nasal  Swab  Result Value Ref Range Status   SARS Coronavirus 2 by RT PCR NEGATIVE NEGATIVE Final    Comment: (NOTE) SARS-CoV-2 target nucleic acids are NOT DETECTED.  The SARS-CoV-2 RNA is generally detectable in upper respiratory specimens during the acute phase of infection. The lowest concentration of SARS-CoV-2 viral copies this assay can detect is 138 copies/mL. A negative result does not preclude SARS-Cov-2 infection and should not be used as the sole basis for treatment or other patient management decisions. A negative result may occur with  improper specimen collection/handling, submission of specimen other than nasopharyngeal swab, presence of viral mutation(s) within the areas targeted by this assay, and inadequate number of viral copies(<138 copies/mL). A negative result must be combined with clinical observations, patient history, and epidemiological information. The expected result is Negative.  Fact Sheet for Patients:  bloggercourse.com  Fact Sheet for Healthcare Providers:  seriousbroker.it  This test is no t yet approved or cleared by the [place]  FDA and  has been authorized for detection and/or diagnosis of SARS-CoV-2  by FDA under an Emergency Use Authorization (EUA). This EUA will remain  in effect (meaning this test can be used) for the duration of the COVID-19 declaration under Section 564(b)(1) of the Act, 21 U.S.C.section 360bbb-3(b)(1), unless the authorization is terminated  or revoked sooner.       Influenza A by PCR NEGATIVE NEGATIVE Final   Influenza B by PCR NEGATIVE NEGATIVE Final    Comment: (NOTE) The Xpert Xpress SARS-CoV-2/FLU/RSV plus assay is intended as an aid in the diagnosis of influenza from Nasopharyngeal swab specimens and should not be used as a sole basis for treatment. Nasal washings and aspirates are unacceptable for Xpert Xpress SARS-CoV-2/FLU/RSV testing.  Fact Sheet for Patients: bloggercourse.com  Fact Sheet for Healthcare Providers: seriousbroker.it  This test is not yet approved or cleared by the [place]  FDA and has been authorized for detection and/or diagnosis of SARS-CoV-2 by FDA under an Emergency Use Authorization (EUA). This EUA will remain in effect (meaning this test can be used) for the duration of the COVID-19 declaration under Section 564(b)(1) of the Act, 21 U.S.C. section 360bbb-3(b)(1), unless the authorization is terminated or revoked.     Resp Syncytial Virus by PCR NEGATIVE NEGATIVE Final    Comment: (NOTE) Fact Sheet for Patients: bloggercourse.com  Fact Sheet for Healthcare Providers: seriousbroker.it  This test is not yet approved or cleared by the [place]  FDA and has been authorized for detection and/or diagnosis of SARS-CoV-2 by FDA under an Emergency Use Authorization (EUA). This EUA will remain in effect (meaning this test can be used) for the duration of the COVID-19 declaration under Section 564(b)(1) of the Act, 21 U.S.C. section 360bbb-3(b)(1), unless the authorization is terminated or revoked.  Performed at  [facility], [address] [place], [place] [address]      Scheduled Meds:  arformoterol   15 mcg Nebulization BID   aspirin  EC  81 mg Oral Daily   atorvastatin   80 mg Oral Daily   budesonide  (PULMICORT ) nebulizer solution  0.5 mg Nebulization BID   enoxaparin  (LOVENOX ) injection  40 mg Subcutaneous QHS   feeding supplement  237 mL Oral BID BM   guaiFENesin   600 mg Oral BID   insulin  aspart  0-15 Units Subcutaneous TID WC   insulin  aspart  0-5 Units Subcutaneous QHS   ipratropium-albuterol   3 mL Nebulization TID   levothyroxine   50 mcg Oral Q0600   pregabalin   150 mg Oral BID  venlafaxine  XR  150 mg Oral Daily   [START ON 11/11/2024] vortioxetine  HBr  10 mg Oral Daily   Continuous Infusions:  azithromycin  250 mL/hr at 11/09/24 1859   cefTRIAXone  (ROCEPHIN )  IV Stopped (11/09/24 1821)   lactated ringers  75 mL/hr at 11/10/24 0407   magnesium  sulfate bolus IVPB      Procedures/Studies: CT Chest Wo Contrast Result Date: 11/08/2024 CLINICAL DATA:  Fever nonproductive cough EXAM: CT CHEST WITHOUT CONTRAST TECHNIQUE: Multidetector CT imaging of the chest was performed following the standard protocol without IV contrast. RADIATION DOSE REDUCTION: This exam was performed according to the departmental dose-optimization program which includes automated exposure control, adjustment of the mA and/or kV according to patient size and/or use of iterative reconstruction technique. COMPARISON:  Chest x-ray 11/08/2024, chest CT 10/11/2024, 05/22/2024 FINDINGS: Cardiovascular: Limited assessment without intravenous contrast. Mild aortic atherosclerosis. No aneurysm. Mitral calcification. Mild aortic valvular calcification. Normal cardiac size. No pericardial effusion Mediastinum/Nodes: Patent trachea. No thyroid  mass. Mildly prominent precarinal node measuring 11 mm. Limited assessment for hilar nodes in the absence of contrast. Subcarinal lymph node measures 14 mm. Esophagus within normal limits.  Lungs/Pleura: Trace right-sided pleural effusion. Multifocal bilateral ground-glass densities and small foci of consolidation. Upper Abdomen: No acute finding. Stable 11 mm left adrenal nodule consistent with adenoma. Musculoskeletal: Moderate superior endplate deformity T12, stable compared with CT earlier this month, new compared with CT from May. Suspicion of mild paravertebral hazy density as may be seen with subacute fracture. IMPRESSION: 1. Multifocal bilateral ground-glass densities and small foci of consolidation, suspect for multifocal pneumonia. Trace right-sided pleural effusion. 2. Mildly prominent mediastinal lymph nodes, likely reactive. 3. Moderate superior endplate deformity at T12, stable compared with CT earlier this month, new compared with CT from May. Suspicion of mild paravertebral hazy density as may be seen with subacute fracture. 4. Aortic atherosclerosis. Aortic Atherosclerosis (ICD10-I70.0). Electronically Signed   By: Luke Bun M.D.   On: 11/08/2024 18:03   DG Chest Portable 1 View Result Date: 11/08/2024 EXAM: 1 VIEW(S) XRAY OF THE CHEST 11/08/2024 04:05:00 PM COMPARISON: 10/11/2024 CLINICAL HISTORY: SOB, cough. FINDINGS: LUNGS AND PLEURA: Stable minimal interstitial densities are noted which may represent scarring but minimal pulmonary edema cannot be excluded. Minimal pleural effusions may be present. No pneumothorax. HEART AND MEDIASTINUM: Stable cardiomediastinal silhouette. BONES AND SOFT TISSUES: No acute osseous abnormality. IMPRESSION: 1. Stable minimal interstitial densities, possibly representing scarring or minimal pulmonary edema. 2. Stable minimal pleural effusions. Electronically signed by: Lynwood Seip MD 11/08/2024 04:18 PM EST RP Workstation: [id number]   DG BONE DENSITY (DXA) Result Date: 10/30/2024 EXAM: DUAL X-RAY ABSORPTIOMETRY (DXA) FOR BONE MINERAL DENSITY 10/30/2024 4:39 pm CLINICAL DATA:  83 year old Female Postmenopausal. Hypoestrogen needs dxa Patient  is or has been on glucocorticoid therapy. Patient is or has been on bone building therapies. TECHNIQUE: An axial (e.g., hips, spine) and/or appendicular (e.g., radius) exam was performed, as appropriate, using [employer] Secretary/administrator at [employer]. Images are obtained for bone mineral density measurement and are not obtained for diagnostic purposes. [id number] Exclusions: None. COMPARISON:  None. New baseline. FINDINGS: Scan quality: Good. LUMBAR SPINE (L1-L4): BMD (in g/cm2): 1.144 T-score: -0.3 Z-score: 1.6 LEFT FEMORAL NECK: BMD (in g/cm2): 0.962 T-score: -0.5 Z-score: 1.7 LEFT TOTAL HIP: BMD (in g/cm2): 0.970 T-score: -0.3 Z-score: 1.9 RIGHT FEMORAL NECK: BMD (in g/cm2): 1.016 T-score: -0.2 Z-score: 2.1 RIGHT TOTAL HIP: BMD (in g/cm2): 0.994 T-score: -0.1 Z-score: 2.1 FRAX 10-YEAR PROBABILITY OF FRACTURE: FRAX not reported as  the lowest BMD is not in the osteopenia range. IMPRESSION: Normal based on BMD. Fracture risk is unknown due to history of bone building therapy. RECOMMENDATIONS: 1. All patients should optimize calcium  and vitamin D  intake. 2. Consider FDA-approved medical therapies in postmenopausal women and men aged 54 years and older, based on the following: - A hip or vertebral (clinical or morphometric) fracture - T-score less than or equal to -2.5 and secondary causes have been excluded. - Low bone mass (T-score between -1.0 and -2.5) and a 10-year probability of a hip fracture greater than or equal to 3% or a 10-year probability of a major osteoporosis-related fracture greater than or equal to 20% based on the [place] -adapted WHO algorithm. - Clinician judgment and/or patient preferences may indicate treatment for people with 10-year fracture probabilities above or below these levels 3. Patients with diagnosis of osteoporosis or at high risk for fracture should have regular bone mineral density tests. For patients eligible for Medicare, routine testing is allowed once every 2  years. The testing frequency can be increased to one year for patients who have rapidly progressing disease, those who are receiving or discontinuing medical therapy to restore bone mass, or have additional risk factors. Electronically Signed   By: Dina  Arceo M.D.   On: 10/30/2024 19:02   MR BRAIN W WO CONTRAST Result Date: 10/25/2024 EXAM: MRI BRAIN WITH AND WITHOUT CONTRAST 10/25/2024 12:06:05 PM TECHNIQUE: Multiplanar multisequence MRI of the head/brain was performed with and without the administration of intravenous contrast. COMPARISON: 03/11/2024 and 10/28/2022 brain MRI. CLINICAL HISTORY: Brain/CNS neoplasm, assess treatment response. FINDINGS: BRAIN AND VENTRICLES: No acute infarct. No acute intracranial hemorrhage. No mass effect or midline shift. No hydrocephalus. Normal flow voids. No mass or abnormal enhancement. Old bilateral cerebellar infarcts. Old small vessel infarcts of the corona radiata and centrum semiovale. Generalized volume loss. Multifocal hyperintense T2-weighted signal within the cerebral white matter, most commonly due to chronic small vessel disease. 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 PITUITARY: Intrasellar/suprasellar mass measures 2.4 x 1.8 x 1.5 cm (AP x CC x Transverse), unchanged compared to 10/28/2022. Mild mass effect on the cisternal segment of the right optic nerve. Grade 3 cavernous sinus invasion on the right. Cavernous carotid flow voids are maintained. The infundibulum is deviated superiorly. ORBITS: Ocular lens replacements. No acute abnormality. SINUSES: No acute abnormality. BONES AND SOFT TISSUES: Normal bone marrow signal and enhancement. No acute soft tissue abnormality. IMPRESSION: 1. 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 2. Old bilateral  cerebellar infarcts and chronic small vessel disease-related white matter changes. Electronically signed by: Franky Stanford MD 10/25/2024 01:08 PM EDT RP Workstation: [id number]   CT HEAD WO CONTRAST ( ) Result Date: 10/12/2024 EXAM: CT HEAD WITHOUT CONTRAST 10/12/2024 05:57:18 PM TECHNIQUE: CT of the head was performed without the administration of intravenous contrast. Automated exposure control, iterative reconstruction, and/or weight based adjustment of the mA/kV was utilized to reduce the radiation dose to as low as reasonably achievable. COMPARISON: Comparison made with prior CT from 07/04/2024 as well as earlier studies. CLINICAL HISTORY: Mental status change, unknown cause. FINDINGS: BRAIN AND VENTRICLES: Patchy hyperdensity involving the supratentorial cerebral white matter consistent with chronic 2-vessel ischemic disease, stable. Few small remote cerebellar infarcts, right greater than left, also stable. No acute intracranial hemorrhage. No visible acute large vessel territory  infarct. No hydrocephalus. No extra-axial collection. No mass effect or midline shift. 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 No abnormal hyperdense vessel. Calcified atherosclerosis present about the skull base. ORBITS: No acute abnormality. SINUSES: No acute abnormality. SOFT TISSUES AND SKULL: No acute soft tissue abnormality. No skull fracture. IMPRESSION: 1. No acute intracranial abnormality. 2. 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 3. Chronic microvascular ischemic disease with a few small remote cerebellar infarcts, stable. Electronically signed by: Morene Hoard MD 10/12/2024 06:33 PM EDT RP Workstation: [id number] Alm Schneider, DO  [facility]  If 7PM-7AM, please contact night-coverage [email or web address] Password [email or web address] 11/10/2024, 1:15 PM   LOS: 2  days

## 2024-11-10 NOTE — Evaluation (Signed)
 Clinical/Bedside Swallow Evaluation Patient Details  Name: [name] White MRN: [id number] Date of Birth: 05-13-1941  Today's Date: 11/10/2024 Time: SLP Start Time (ACUTE ONLY): 1407 SLP Stop Time (ACUTE ONLY): 1432 SLP Time Calculation (min) (ACUTE ONLY): 25 min  Past Medical History:  Past Medical History:  Diagnosis Date   Anemia    Anxiety    Asthma    ASYMPTOMATIC POSTMENOPAUSAL STATUS 02/09/2009   Cataract    Cerebral infarction (HCC)    Constipation 03/19/2013   Depression    DIABETES MELLITUS, TYPE II 09/14/2007   HYPERCHOLESTEROLEMIA 02/09/2009   HYPERTENSION 02/09/2009   HYPOTHYROIDISM 09/14/2007   Metabolic encephalopathy    MIGRAINE HEADACHE 09/14/2007   Neuropathy    OSTEOPOROSIS 02/09/2009   PANCREATITIS 09/14/2007   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   PONV (postoperative nausea and vomiting)    Protein calorie malnutrition    Rectocele 03/19/2013   Shortness of breath    SUPERFICIAL PHLEBITIS 09/14/2007   Varicose veins    Past Surgical History:  Past Surgical History:  Procedure Laterality Date   ABDOMINAL HYSTERECTOMY     ANTERIOR AND POSTERIOR REPAIR N/A 04/02/2013   Procedure: ANTERIOR (CYSTOCELE) AND POSTERIOR REPAIR (RECTOCELE);  Surgeon: Norleen LULLA Server, MD;  Location: [facility];  Service: Gynecology;  Laterality: N/A;   APPENDECTOMY     BRAIN SURGERY     CATARACT EXTRACTION W/PHACO  12/05/2011   Procedure: CATARACT EXTRACTION PHACO AND INTRAOCULAR LENS PLACEMENT (IOC);  Surgeon: Cherene Mania;  Location: [facility];  Service: Ophthalmology;  Laterality: Left;  CDE:9.65   CHOLECYSTECTOMY     CRANIOTOMY N/A 06/04/2020   Procedure: Endoscopic Transphenoidal Resection of Recurrent PituitaryTumor;  Surgeon: Colon Shove, MD;  Location: [facility];  Service: Neurosurgery;  Laterality: N/A;   CYSTOSCOPY     ENDOVENOUS ABLATION SAPHENOUS VEIN W/ LASER  11-03-2011   right greater saphenous vein   left leg done 10-2011   EYE SURGERY  98   right cataract extraction 98    LAPAROSCOPIC NISSEN FUNDOPLICATION     NM ESOPHAGEAL REFLUX  08-11-11   PITUITARY EXCISION  10/1997   POSTERIOR REPAIR     TRANSNASAL APPROACH N/A 06/04/2020   Procedure: TRANSNASAL APPROACH;  Surgeon: Mable Lenis, MD;  Location[facility];  Service: ENT;  Laterality: N/A;   TRANSPHENOIDAL / TRANSNASAL HYPOPHYSECTOMY / RESECTION PITUITARY TUMOR  08-11-11   HPI:  83 year old female with a history of pituitary adenoma, diabetes mellitus type 2, stroke, hypertension, COPD, hypothyroidism, hyperlipidemia, anxiety, PSVT presenting with coughing, chest congestion, shortness of breath.  She was noted to be more confused than usual.  She was seen by her PCP who sent her to the emergency department for further evaluation and treatment. She was most recently hospitalized from 10/11/2024 to 10/16/2024.  She was treated for COPD exacerbation and pneumonia at that time. The patient was admitted to the hospital from 05/22/2024 to 05/28/2024 for severe sepsis due to pneumonia and VRE UTI.    Assessment / Plan / Recommendation  Clinical Impression  Clinical swallowing evaluation completed while Pt was sitting upright in bed; Family and RN report improved alertness today and that part of the concern yesterday was lethargy. Pt is alert and responsive today accurately answering all questions. Pt presents with a congested sounding baseline cough. Pt consumed thin liquids, puree and regular textures without overt s/sx of aspiration. Pt did demonstrate some increased shortness of breath noted when talking and with eating. Note Pt has had PNA at least 4 times this year however,  MBSS completed in May of 2025 revealed swallowing was largely [facility]. Pt is at higher risk for aspiration d/t deconditioned status and compromised respiratory status. SLP reviewed pros and cons of diet adjustment to softer textures including decreased fatigue and decreased time required to eat on the contrary it may limit her options. Pt requests remaining  on  a regular diet recognizing that she may fatigue more easily which may increase her risk of aspiration. Based on standard of care and Pt's choice recommend continue with regular diet and thin liquids. Meds are ok whole with liquids one at a time. Recommend frequent oral care and slow rate with breaks for breathing. SLP also re-educated patient and family of suspected small Zenker's Diverticulum on recent MBSS and that multiple dry swallows are recommended to ensure clearance. There are no further ST needs indicated at this time, thank you for this referral, SLP Visit Diagnosis: Dysphagia, unspecified (R13.10)    Aspiration Risk  Mild aspiration risk;Moderate aspiration risk    Diet Recommendation Regular;Thin liquid    Liquid Administration via: Cup;Straw Medication Administration: Whole meds with liquid Supervision: Patient able to self feed Compensations: Minimize environmental distractions;Slow rate;Small sips/bites Postural Changes: Seated upright at 90 degrees    Other  Recommendations Oral Care Recommendations: Oral care BID       Swallow Study   General HPI: 83 year old female with a history of pituitary adenoma, diabetes mellitus type 2, stroke, hypertension, COPD, hypothyroidism, hyperlipidemia, anxiety, PSVT presenting with coughing, chest congestion, shortness of breath.  She was noted to be more confused than usual.  She was seen by her PCP who sent her to the emergency department for further evaluation and treatment. She was most recently hospitalized from 10/11/2024 to 10/16/2024.  She was treated for COPD exacerbation and pneumonia at that time. The patient was admitted to the hospital from 05/22/2024 to 05/28/2024 for severe sepsis due to pneumonia and VRE UTI. Type of Study: Bedside Swallow Evaluation Previous Swallow Assessment: Most recent BSE In 05/2024, MBSS completed 04/2024 Diet Prior to this Study: Regular;Thin liquids (Level 0) Temperature Spikes Noted:  No Respiratory Status: Room air;Nasal cannula History of Recent Intubation: No Behavior/Cognition: Alert;Cooperative;Pleasant mood Oral Cavity Assessment: Within Functional Limits Oral Care Completed by SLP: Recent completion by staff Oral Cavity - Dentition: Adequate natural dentition Vision: Functional for self-feeding Self-Feeding Abilities: Able to feed self Patient Positioning: Upright in bed Baseline Vocal Quality: Normal Volitional Cough: Strong Volitional Swallow: Able to elicit    Oral/Motor/Sensory Function Overall Oral Motor/Sensory Function: Within functional limits   Ice Chips Ice chips: Within functional limits   Thin Liquid Thin Liquid: Within functional limits    Nectar Thick Nectar Thick Liquid: Not tested   Honey Thick Honey Thick Liquid: Not tested   Puree Puree: Within functional limits   Solid     Solid: Within functional limits Presentation: Self Fed     Kinnley Paulson H. Clois KILLIAN, CCC-SLP Speech Language Pathologist  Raguel VEAR Clois 11/10/2024,2:40 PM

## 2024-11-10 NOTE — Plan of Care (Signed)

## 2024-11-11 DIAGNOSIS — J441 Chronic obstructive pulmonary disease with (acute) exacerbation: Secondary | ICD-10-CM | POA: Diagnosis not present

## 2024-11-11 DIAGNOSIS — E1165 Type 2 diabetes mellitus with hyperglycemia: Secondary | ICD-10-CM | POA: Diagnosis not present

## 2024-11-11 DIAGNOSIS — J181 Lobar pneumonia, unspecified organism: Secondary | ICD-10-CM | POA: Diagnosis not present

## 2024-11-11 DIAGNOSIS — A419 Sepsis, unspecified organism: Secondary | ICD-10-CM | POA: Diagnosis not present

## 2024-11-11 LAB — BASIC METABOLIC PANEL WITH GFR
Anion gap: 13 (ref 5–15)
BUN: 15 mg/dL (ref 8–23)
CO2: 27 mmol/L (ref 22–32)
Calcium: 9.2 mg/dL (ref 8.9–10.3)
Chloride: 102 mmol/L (ref 98–111)
Creatinine, Ser: 0.67 mg/dL (ref 0.44–1.00)
GFR, Estimated: >60 mL/min (ref >60)
Glucose, Bld: 207 mg/dL — ABNORMAL HIGH (ref 70–99)
Potassium: 4.4 mmol/L (ref 3.5–5.1)
Sodium: 141 mmol/L (ref 135–145)

## 2024-11-11 LAB — URINALYSIS, COMPLETE (UACMP) WITH MICROSCOPIC
Bacteria, UA: NONE SEEN
Bilirubin Urine: NEGATIVE
Glucose, UA: >=500 mg/dL — AB
Hgb urine dipstick: NEGATIVE
Ketones, ur: NEGATIVE mg/dL
Leukocytes,Ua: NEGATIVE
Nitrite: NEGATIVE
Protein, ur: NEGATIVE mg/dL
Specific Gravity, Urine: 1.015 (ref 1.005–1.030)
pH: 5 (ref 5.0–8.0)

## 2024-11-11 LAB — LEGIONELLA PNEUMOPHILA SEROGP 1 UR AG: L. pneumophila Serogp 1 Ur Ag: NEGATIVE

## 2024-11-11 LAB — MAGNESIUM: Magnesium: 2.3 mg/dL (ref 1.7–2.4)

## 2024-11-11 LAB — GLUCOSE, CAPILLARY
Glucose-Capillary: 207 mg/dL — ABNORMAL HIGH (ref 70–99)
Glucose-Capillary: 281 mg/dL — ABNORMAL HIGH (ref 70–99)
Glucose-Capillary: 154 mg/dL — ABNORMAL HIGH (ref 70–99)
Glucose-Capillary: 256 mg/dL — ABNORMAL HIGH (ref 70–99)

## 2024-11-11 MED ORDER — INSULIN ASPART 100 UNIT/ML IJ SOLN
5.0000 [IU] | Freq: Three times a day (TID) | INTRAMUSCULAR | Status: DC
  Administered 2024-11-12 (×2): 5 [IU] via SUBCUTANEOUS
  Filled 2024-11-11 (×2): qty 1

## 2024-11-11 MED ORDER — ALBUTEROL SULFATE (2.5 MG/3ML) 0.083% IN NEBU
2.5000 mg | INHALATION_SOLUTION | Freq: Three times a day (TID) | RESPIRATORY_TRACT | Status: DC
  Administered 2024-11-11 – 2024-11-12 (×2): 2.5 mg via RESPIRATORY_TRACT
  Filled 2024-11-11: qty 3

## 2024-11-11 MED ORDER — MIDODRINE HCL 5 MG PO TABS
5.0000 mg | ORAL_TABLET | Freq: Three times a day (TID) | ORAL | Status: DC
  Administered 2024-11-12 (×2): 5 mg via ORAL
  Filled 2024-11-11 (×2): qty 1

## 2024-11-11 MED ORDER — PREDNISONE 5 MG PO TABS: 5.0000 mg | ORAL_TABLET | Freq: Every day | ORAL | Status: DC

## 2024-11-11 NOTE — Evaluation (Signed)
 Occupational Therapy Evaluation Patient Details Name: [name] White MRN: [id number] DOB: Feb 14, 1941 Today's Date: 11/11/2024   History of Present Illness   [name] White is a 83 y.o. female with medical history significant of diabetes, hypertension, hypothyroidism, dementia, malnutrition, history of CVA, asthma.  [facility].  She was admitted in the middle of October with pneumonia and reactive airway disease.  She was discharged back to the assisted living facilities on long-acting inhalers and has been doing fairly well, except she never stops coughing.  Recently, her coughing became worse.  No fevers here, but may have had a fever at the assisted living facility.  She was seen today at her PCPs office, Dr. [name], who sent her here here for evaluation. (per DO)     Clinical Impressions Pt agreeable to OT evaluation. Pt has baseline dementia with no family or staff of ALF present. Pt reports assist for most ADL's and SPT with assist at baseline. Pt required mod A for bed mobility with HOB elevated and max to total A for EOB to chair squat pivot transfer. Pt demonstrates very poor standing balance and fair sitting balance. B UE generally weak. Pt able to doff and don sock with CGA seated in the recliner. Set up to min A likely for seated ADL's based on observations today. Pt left in the chair with call bell within reach and chair alarm set. Pt will benefit from continued OT in the hospital to increase strength, balance, and endurance for safe ADL's.        If plan is discharge home, recommend the following:   A lot of help with bathing/dressing/bathroom;A little help with walking and/or transfers;Assistance with cooking/housework;Assist for transportation;Help with stairs or ramp for entrance;Direct supervision/assist for medications management     Functional Status Assessment   Patient has had a recent decline in their functional status and/or  demonstrates limited ability to make significant improvements in function in a reasonable and predictable amount of time     Equipment Recommendations   None recommended by OT              Precautions/Restrictions   Precautions Precautions: Fall Recall of Precautions/Restrictions: Impaired Restrictions Weight Bearing Restrictions Per Provider Order: No     Mobility Bed Mobility Overal bed mobility: Needs Assistance Bed Mobility: Supine to Sit     Supine to sit: Mod assist, HOB elevated     General bed mobility comments: labored movement; assist for trunk control; extended time to scoot to EOB.    Transfers Overall transfer level: Needs assistance   Transfers: Bed to chair/wheelchair/BSC     Squat pivot transfers: Max assist, Total assist       General transfer comment: EOB to chair without AD; very unsteady on feet with much assist needed.      Balance Overall balance assessment: Needs assistance Sitting-balance support: No upper extremity supported, Feet supported Sitting balance-Leahy Scale: Poor Sitting balance - Comments: poor to fair seated at EOB.   Standing balance support: During functional activity Standing balance-Leahy Scale: Zero Standing balance comment: Assisted in partial stand by this therapist without AD.                           ADL either performed or assessed with clinical judgement   ADL Overall ADL's : Needs assistance/impaired     Grooming: Set up;Sitting   Upper Body Bathing: Sitting;Set up;Minimal assistance  Lower Body Bathing: Sitting/lateral leans;Minimal assistance   Upper Body Dressing : Sitting;Set up   Lower Body Dressing: Minimal assistance;Sitting/lateral leans Lower Body Dressing Details (indicate cue type and reason): Pt able to doff and don sock seated in recliner. Toilet Transfer: Maximal assistance;Stand-pivot;Total assistance Toilet Transfer Details (indicate cue type and reason): EOB to  chair without AD Toileting- Clothing Manipulation and Hygiene: Bed level;Sitting/lateral lean;Minimal assistance;Moderate assistance               Vision Baseline Vision/History: 1 Wears glasses Ability to See in Adequate Light: 1 Impaired Patient Visual Report: No change from baseline Vision Assessment?: No apparent visual deficits     Perception Perception: Not tested       Praxis Praxis: Not tested       Pertinent Vitals/Pain Pain Assessment Pain Assessment: Faces Faces Pain Scale: Hurts a little bit Pain Location: neck and head Pain Descriptors / Indicators: Discomfort Pain Intervention(s): Monitored during session, Repositioned     Extremity/Trunk Assessment Upper Extremity Assessment Upper Extremity Assessment: Generalized weakness   Lower Extremity Assessment Lower Extremity Assessment: Defer to PT evaluation   Cervical / Trunk Assessment Cervical / Trunk Assessment: Kyphotic   Communication Communication Communication: No apparent difficulties   Cognition Arousal: Alert Behavior During Therapy: WFL for tasks assessed/performed Cognition: No apparent impairments                               Following commands: Intact       Cueing  General Comments   Cueing Techniques: Tactile cues;Verbal cues  Pt was removing her suppmental oxygen as this therpaist was entering the room. Saturation was above 90% when tested.              Home Living Family/patient expects to be discharged to:: Assisted living                             Home Equipment: Rolling Walker (2 wheels);Rollator (4 wheels);Cane - quad;Cane - single point;Hand held shower head;Shower seat - built in;Grab bars - tub/shower   Additional Comments: per chart      Prior Functioning/Environment Prior Level of Function : Patient poor historian/Family not available;Needs assist       Physical Assist : Mobility (physical);ADLs (physical) Mobility (physical):  Bed mobility;Transfers;Gait ADLs (physical): Bathing;Dressing;Toileting;IADLs Mobility Comments: Today pt reports assisted SPT to w/c at baseline. ADLs Comments: Pt reports assist with ADL's other than feeding and grooming.    OT Problem List: Decreased strength;Decreased activity tolerance;Impaired balance (sitting and/or standing);Decreased cognition   OT Treatment/Interventions: Self-care/ADL training;Therapeutic exercise;Therapeutic activities;Balance training;Patient/family education;Cognitive remediation/compensation;DME and/or AE instruction      OT Goals(Current goals can be found in the care plan section)   Acute Rehab OT Goals Patient Stated Goal: Return to ALF OT Goal Formulation: With patient Time For Goal Achievement: 11/25/24 Potential to Achieve Goals: Good   OT Frequency:  Min 2X/week                                   End of Session Equipment Utilized During Treatment: Gait belt Nurse Communication: Mobility status (Notified that the purewick fell out and that the pt preferred the NT to place it back.)  Activity Tolerance: Patient tolerated treatment well Patient left: in chair;with call bell/phone within reach;with chair alarm set  OT  Visit Diagnosis: Other abnormalities of gait and mobility (R26.89);Unsteadiness on feet (R26.81);Muscle weakness (generalized) (M62.81)                Time: 8556-8495 OT Time Calculation (min): 21 min Charges:  OT General Charges $OT Visit: 1 Visit OT Evaluation $OT Eval Low Complexity: 1 Low  Jumana Paccione OT, MOT   Jayson Person 11/11/2024, 4:19 PM

## 2024-11-11 NOTE — Discharge Summary (Incomplete)
 Physician Discharge Summary   Patient: [name] White MRN: [id number] DOB: February 04, 1941  Admit date:     11/08/2024  Discharge date: 11/12/2024  Discharge Physician: Alm Julicia Krieger   PCP: [name], MD   Recommendations at discharge:   Please follow up with primary care provider within 1-2 weeks  Please repeat BMP and CBC in one week    Hospital Course: 83 year old female with a history of pituitary adenoma, diabetes mellitus type 2, stroke, hypertension, COPD, hypothyroidism, hyperlipidemia, anxiety, PSVT presenting with coughing, chest congestion, shortness of breath.  She was noted to be more confused than usual.  She was seen by her PCP who sent her to the emergency department for further evaluation and treatment.  History is limited secondary to the patient's confusion.  She was most recently hospitalized from 10/11/2024 to 10/16/2024.  She was treated for COPD exacerbation and pneumonia at that time.  She was discharged with cefdinir  and doxycycline for 4 additional days.  The patient was admitted to the hospital from 05/22/2024 to 05/28/2024 for severe sepsis due to pneumonia and VRE UTI.  She was also treated for COPD exacerbation and NKHS.   In the ED, the patient was afebrile but soft blood pressures.  WBC 14.4, hemoglobin 10.5, platelets 220.  Sodium 139, potassium 4.2, bicarbonate 25, serum creatinine 0.59.  Lactic acid 2.5>> 2.1.  Troponin 26>> 21.  COVID-19 PCR is negative.  CT of the chest showed multifocal GGO bilateral with small foci of consolidation.  There was mild mediastinal lymphadenopathy.  She was started on ceftriaxone  and azithromycin .  Assessment and Plan: Sepsis - Present on admission - Presented with tachycardia and leukocytosis - Secondary to pneumonia - Lactic acid 2.5>> 2.1>>0.6 - Follow blood cultures--neg to date - Continue ceftriaxone  and azithromycin  - COVID-19/RSV/flu negative - sepsis physiology resolved   Lobar pneumonia - Continue ceftriaxone  and  azithromycin  - Speech therapy evaluation>> regular diet with thin liquids - 11/14 CT chest as discussed above in history - d/c home with 3 more days cefdinir  and azithro   COPD - Started Brovana  - Started DuoNebs - Started Pulmicort  - started yupelri  -started IV solumedrol>>finished 3 day burst during hospitalization - no further wheezing, stable on RA at time dc - resume prednisone  5 mg daily    Acute metabolic encephalopathy - Secondary to infectious process - overall improving - 11/11/24 near baseline mentation   Diabetes mellitus type 2 with hyperglycemia - NovoLog  sliding scale - Holding metformin >> resume after discharge - 07/02/2024 hemoglobin A1c 9.8   Mood disorder/anxiety/depression - Continue Effexor  and Trintellix    Mixed hyperlipidemia - Continue statin   Chronic pain - Continue Lyrica    History of stroke - Continue aspirin , statin   Hypothyroidism - Continue Synthroid    Hypomagnesemia -repleted         Consultants: none Procedures performed: none  Disposition: Assisted living Diet recommendation:  Carb modified diet DISCHARGE MEDICATION: Allergies as of 11/12/2024       Reactions   Betadine [povidone Iodine] Hives   Povidone-iodine Hives   Penicillins Rash, Dermatitis        Medication List     TAKE these medications    acetaminophen  325 MG tablet Commonly known as: TYLENOL  Take 2 tablets (650 mg total) by mouth every 6 (six) hours as needed for mild pain (pain score 1-3) (or Fever >/= 101).   albuterol  108 (90 Base) MCG/ACT inhaler Commonly known as: VENTOLIN  HFA Inhale 2 puffs into the lungs every 4 (four) hours as needed  for wheezing or shortness of breath.   aspirin  EC 81 MG tablet Take 81 mg by mouth daily. Swallow whole.   atorvastatin  40 MG tablet Commonly known as: LIPITOR Take 80 mg by mouth daily.   azithromycin  500 MG tablet Commonly known as: ZITHROMAX  Take 1 tablet (500 mg total) by mouth daily.    busPIRone 5 MG tablet Commonly known as: BUSPAR Take 5 mg by mouth 3 (three) times daily as needed (anxiety).   cefdinir  300 MG capsule Commonly known as: OMNICEF  Take 1 capsule (300 mg total) by mouth 2 (two) times daily.   cyanocobalamin  1000 MCG/ML injection Commonly known as: VITAMIN B12 Inject 1,000 mcg into the muscle every 30 (thirty) days.   cycloSPORINE  0.05 % ophthalmic emulsion Commonly known as: RESTASIS  Place 1 drop into both eyes 2 (two) times daily.   fluticasone  furoate-vilanterol 200-25 MCG/ACT Aepb Commonly known as: Breo Ellipta  Inhale 1 puff into the lungs daily.   furosemide  20 MG tablet Commonly known as: Lasix  Take 1 tablet (20 mg total) by mouth daily.   Imvexxy  Maintenance Pack 10 MCG Inst Generic drug: Estradiol  Place 1 suppository vaginally 2 (two) times a week. What changed: additional instructions   ipratropium-albuterol  0.5-2.5 (3) MG/3ML Soln Commonly known as: DUONEB Take 3 mLs by nebulization in the morning, at noon, and at bedtime. What changed: Another medication with the same name was changed. Make sure you understand how and when to take each.   ipratropium-albuterol  0.5-2.5 (3) MG/3ML Soln Commonly known as: DUONEB Take 3 mLs by nebulization every 4 (four) hours as needed (wheezing and SOB). What changed: reasons to take this   levothyroxine  50 MCG tablet Commonly known as: SYNTHROID  Take 50 mcg by mouth daily.   metFORMIN  1000 MG tablet Commonly known as: GLUCOPHAGE  Take 1,000 mg by mouth 2 (two) times daily.   midodrine  5 MG tablet Commonly known as: PROAMATINE  Take 5 mg by mouth 3 (three) times daily with meals.   nitrofurantoin  100 MG capsule Commonly known as: Macrodantin  Take 1 capsule (100 mg total) by mouth daily.   NONFORMULARY OR COMPOUNDED ITEM If patient is incontinent of urine and/or stool, SNF staff are instructed to cleanse patient's genital area at least once every 8 hours and change diaper if soiled.    NONFORMULARY OR COMPOUNDED ITEM SNF staff are instructed to encourage hydration with water  at least once every 2-4 hours while patient is awake.   NONFORMULARY OR COMPOUNDED ITEM SNF staff are instructed to prompt toileting and assist patient with ambulation to toilet at least once every 4-6 hours while awake.   omeprazole 20 MG capsule Commonly known as: PRILOSEC Take 1 capsule (20 mg total) by mouth 2 (two) times daily before a meal.   polyethylene glycol 17 g packet Commonly known as: MIRALAX  / GLYCOLAX  Take 17 g by mouth daily as needed for mild constipation.   potassium chloride  10 MEQ tablet Commonly known as: KLOR-CON  Take 10 mEq by mouth daily.   predniSONE  5 MG tablet Commonly known as: DELTASONE  Take 1 tablet (5 mg total) by mouth daily with breakfast. Restart the day after prednisone  taper is done What changed: additional instructions   pregabalin  150 MG capsule Commonly known as: LYRICA  Take 1 capsule (150 mg total) by mouth 2 (two) times daily.   rizatriptan  10 MG disintegrating tablet Commonly known as: MAXALT -MLT Take 10 mg by mouth as needed for migraine.   Thera-M Tabs Take 1 tablet by mouth daily.   tiZANidine 2 MG tablet  Commonly known as: ZANAFLEX Take 2 mg by mouth 2 (two) times daily as needed for muscle spasms.   traZODone  50 MG tablet Commonly known as: DESYREL  Take 1 tablet (50 mg total) by mouth at bedtime as needed for sleep.   Trintellix  10 MG Tabs tablet Generic drug: vortioxetine  HBr Take 10 mg by mouth daily.   umeclidinium bromide  62.5 MCG/ACT Aepb Commonly known as: INCRUSE ELLIPTA  Inhale 1 puff into the lungs daily.   venlafaxine  XR 150 MG 24 hr capsule Commonly known as: EFFEXOR -XR Take 150 mg by mouth every morning.        Discharge Exam: Filed Weights   11/08/24 1533  Weight: 59 kg   HEENT:  [place]/AT, No thrush, no icterus CV:  RRR, no rub, no S3, no S4 Lung:  bibasilar rales. No wheeze Abd:  soft/+BS, NT Ext:  No  edema, no lymphangitis, no synovitis, no rash   Condition at discharge: stable  The results of significant diagnostics from this hospitalization (including imaging, microbiology, ancillary and laboratory) are listed below for reference.   Imaging Studies: CT Chest Wo Contrast Result Date: 11/08/2024 CLINICAL DATA:  Fever nonproductive cough EXAM: CT CHEST WITHOUT CONTRAST TECHNIQUE: Multidetector CT imaging of the chest was performed following the standard protocol without IV contrast. RADIATION DOSE REDUCTION: This exam was performed according to the departmental dose-optimization program which includes automated exposure control, adjustment of the mA and/or kV according to patient size and/or use of iterative reconstruction technique. COMPARISON:  Chest x-ray 11/08/2024, chest CT 10/11/2024, 05/22/2024 FINDINGS: Cardiovascular: Limited assessment without intravenous contrast. Mild aortic atherosclerosis. No aneurysm. Mitral calcification. Mild aortic valvular calcification. Normal cardiac size. No pericardial effusion Mediastinum/Nodes: Patent trachea. No thyroid  mass. Mildly prominent precarinal node measuring 11 mm. Limited assessment for hilar nodes in the absence of contrast. Subcarinal lymph node measures 14 mm. Esophagus within normal limits. Lungs/Pleura: Trace right-sided pleural effusion. Multifocal bilateral ground-glass densities and small foci of consolidation. Upper Abdomen: No acute finding. Stable 11 mm left adrenal nodule consistent with adenoma. Musculoskeletal: Moderate superior endplate deformity T12, stable compared with CT earlier this month, new compared with CT from May. Suspicion of mild paravertebral hazy density as may be seen with subacute fracture. IMPRESSION: 1. Multifocal bilateral ground-glass densities and small foci of consolidation, suspect for multifocal pneumonia. Trace right-sided pleural effusion. 2. Mildly prominent mediastinal lymph nodes, likely reactive. 3.  Moderate superior endplate deformity at T12, stable compared with CT earlier this month, new compared with CT from May. Suspicion of mild paravertebral hazy density as may be seen with subacute fracture. 4. Aortic atherosclerosis. Aortic Atherosclerosis (ICD10-I70.0). Electronically Signed   By: Luke Bun M.D.   On: 11/08/2024 18:03   DG Chest Portable 1 View Result Date: 11/08/2024 EXAM: 1 VIEW(S) XRAY OF THE CHEST 11/08/2024 04:05:00 PM COMPARISON: 10/11/2024 CLINICAL HISTORY: SOB, cough. FINDINGS: LUNGS AND PLEURA: Stable minimal interstitial densities are noted which may represent scarring but minimal pulmonary edema cannot be excluded. Minimal pleural effusions may be present. No pneumothorax. HEART AND MEDIASTINUM: Stable cardiomediastinal silhouette. BONES AND SOFT TISSUES: No acute osseous abnormality. IMPRESSION: 1. Stable minimal interstitial densities, possibly representing scarring or minimal pulmonary edema. 2. Stable minimal pleural effusions. Electronically signed by: Lynwood Seip MD 11/08/2024 04:18 PM EST RP Workstation: [id number]   DG BONE DENSITY (DXA) Result Date: 10/30/2024 EXAM: DUAL X-RAY ABSORPTIOMETRY (DXA) FOR BONE MINERAL DENSITY 10/30/2024 4:39 pm CLINICAL DATA:  83 year old Female Postmenopausal. Hypoestrogen needs dxa Patient is or has been on glucocorticoid  therapy. Patient is or has been on bone building therapies. TECHNIQUE: An axial (e.g., hips, spine) and/or appendicular (e.g., radius) exam was performed, as appropriate, using [employer] Secretary/administrator at [employer]. Images are obtained for bone mineral density measurement and are not obtained for diagnostic purposes. [id number] Exclusions: None. COMPARISON:  None. New baseline. FINDINGS: Scan quality: Good. LUMBAR SPINE (L1-L4): BMD (in g/cm2): 1.144 T-score: -0.3 Z-score: 1.6 LEFT FEMORAL NECK: BMD (in g/cm2): 0.962 T-score: -0.5 Z-score: 1.7 LEFT TOTAL HIP: BMD (in g/cm2): 0.970 T-score: -0.3  Z-score: 1.9 RIGHT FEMORAL NECK: BMD (in g/cm2): 1.016 T-score: -0.2 Z-score: 2.1 RIGHT TOTAL HIP: BMD (in g/cm2): 0.994 T-score: -0.1 Z-score: 2.1 FRAX 10-YEAR PROBABILITY OF FRACTURE: FRAX not reported as the lowest BMD is not in the osteopenia range. IMPRESSION: Normal based on BMD. Fracture risk is unknown due to history of bone building therapy. RECOMMENDATIONS: 1. All patients should optimize calcium  and vitamin D  intake. 2. Consider FDA-approved medical therapies in postmenopausal women and men aged 71 years and older, based on the following: - A hip or vertebral (clinical or morphometric) fracture - T-score less than or equal to -2.5 and secondary causes have been excluded. - Low bone mass (T-score between -1.0 and -2.5) and a 10-year probability of a hip fracture greater than or equal to 3% or a 10-year probability of a major osteoporosis-related fracture greater than or equal to 20% based on the [place] -adapted WHO algorithm. - Clinician judgment and/or patient preferences may indicate treatment for people with 10-year fracture probabilities above or below these levels 3. Patients with diagnosis of osteoporosis or at high risk for fracture should have regular bone mineral density tests. For patients eligible for Medicare, routine testing is allowed once every 2 years. The testing frequency can be increased to one year for patients who have rapidly progressing disease, those who are receiving or discontinuing medical therapy to restore bone mass, or have additional risk factors. Electronically Signed   By: Dina  Arceo M.D.   On: 10/30/2024 19:02   MR BRAIN W WO CONTRAST Result Date: 10/25/2024 EXAM: MRI BRAIN WITH AND WITHOUT CONTRAST 10/25/2024 12:06:05 PM TECHNIQUE: Multiplanar multisequence MRI of the head/brain was performed with and without the administration of intravenous contrast. COMPARISON: 03/11/2024 and 10/28/2022 brain MRI. CLINICAL HISTORY: Brain/CNS neoplasm, assess treatment response.  FINDINGS: BRAIN AND VENTRICLES: No acute infarct. No acute intracranial hemorrhage. No mass effect or midline shift. No hydrocephalus. Normal flow voids. No mass or abnormal enhancement. Old bilateral cerebellar infarcts. Old small vessel infarcts of the corona radiata and centrum semiovale. Generalized volume loss. Multifocal hyperintense T2-weighted signal within the cerebral white matter, most commonly due to chronic small vessel disease. 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 PITUITARY: Intrasellar/suprasellar mass measures 2.4 x 1.8 x 1.5 cm (AP x CC x Transverse), unchanged compared to 10/28/2022. Mild mass effect on the cisternal segment of the right optic nerve. Grade 3 cavernous sinus invasion on the right. Cavernous carotid flow voids are maintained. The infundibulum is deviated superiorly. ORBITS: Ocular lens replacements. No acute abnormality. SINUSES: No acute abnormality. BONES AND SOFT TISSUES: Normal bone marrow signal and enhancement. No acute soft tissue abnormality. IMPRESSION: 1. 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 2. Old bilateral cerebellar infarcts and chronic small vessel disease-related white matter changes. Electronically signed by: Franky  Alexa MD 10/25/2024 01:08 PM EDT RP Workstation: [id number]    Microbiology: Results for orders placed or performed during the hospital encounter of 11/08/24  Culture, blood (routine x 2)     Status: None (Preliminary result)   Collection Time: 11/08/24  4:27 PM   Specimen: BLOOD  Result Value Ref Range Status   Specimen Description BLOOD BLOOD RIGHT ARM  Final   Special Requests   Final    BOTTLES DRAWN AEROBIC AND ANAEROBIC Blood Culture adequate volume   Culture   Final    NO GROWTH 4 DAYS Performed at [facility], [address], [place], [place] [address]    Report Status PENDING  Incomplete  Culture, blood (routine x 2)     Status: None (Preliminary result)   Collection Time: 11/08/24  4:27 PM   Specimen: BLOOD  Result Value Ref Range Status   Specimen Description BLOOD BLOOD LEFT ARM  Final   Special Requests   Final    BOTTLES DRAWN AEROBIC AND ANAEROBIC Blood Culture adequate volume   Culture   Final    NO GROWTH 4 DAYS Performed at [facility], [address], [place], [place] [address]    Report Status PENDING  Incomplete  Resp panel by RT-PCR (RSV, Flu A&B, Covid) Anterior Nasal Swab     Status: None   Collection Time: 11/08/24 10:47 PM   Specimen: Anterior Nasal Swab  Result Value Ref Range Status   SARS Coronavirus 2 by RT PCR NEGATIVE NEGATIVE Final    Comment: (NOTE) SARS-CoV-2 target nucleic acids are NOT DETECTED.  The SARS-CoV-2 RNA is generally detectable in upper respiratory specimens during the acute phase of infection. The lowest concentration of SARS-CoV-2 viral copies this assay can detect is 138 copies/mL. A negative result does not preclude SARS-Cov-2 infection and should not be used as the sole basis for treatment or other patient management decisions. A negative result may occur with  improper specimen collection/handling, submission of specimen other than nasopharyngeal swab, presence of viral mutation(s) within the areas targeted by this assay, and inadequate number of viral copies(<138 copies/mL). A negative result must be combined with clinical observations, patient history, and epidemiological information. The expected result is Negative.  Fact Sheet for Patients:  bloggercourse.com  Fact Sheet for Healthcare Providers:  seriousbroker.it  This test is no t yet approved or cleared by the [place]  FDA and  has been authorized for detection and/or diagnosis of SARS-CoV-2 by FDA under an Emergency Use  Authorization (EUA). This EUA will remain  in effect (meaning this test can be used) for the duration of the COVID-19 declaration under Section 564(b)(1) of the Act, 21 U.S.C.section 360bbb-3(b)(1), unless the authorization is terminated  or revoked sooner.       Influenza A by PCR NEGATIVE NEGATIVE Final   Influenza B by PCR NEGATIVE NEGATIVE Final    Comment: (NOTE) The Xpert Xpress SARS-CoV-2/FLU/RSV plus assay is intended as an aid in the diagnosis of influenza from Nasopharyngeal swab specimens and should not be used as a sole basis for treatment. Nasal washings and aspirates are unacceptable for Xpert Xpress SARS-CoV-2/FLU/RSV testing.  Fact Sheet for Patients: bloggercourse.com  Fact Sheet for Healthcare Providers: seriousbroker.it  This test is not yet approved or cleared by the [place]  FDA and has been authorized for detection and/or diagnosis of SARS-CoV-2 by FDA under an Emergency Use Authorization (EUA). This EUA will remain in effect (meaning this test can be used) for the duration of the  COVID-19 declaration under Section 564(b)(1) of the Act, 21 U.S.C. section 360bbb-3(b)(1), unless the authorization is terminated or revoked.     Resp Syncytial Virus by PCR NEGATIVE NEGATIVE Final    Comment: (NOTE) Fact Sheet for Patients: bloggercourse.com  Fact Sheet for Healthcare Providers: seriousbroker.it  This test is not yet approved or cleared by the [place]  FDA and has been authorized for detection and/or diagnosis of SARS-CoV-2 by FDA under an Emergency Use Authorization (EUA). This EUA will remain in effect (meaning this test can be used) for the duration of the COVID-19 declaration under Section 564(b)(1) of the Act, 21 U.S.C. section 360bbb-3(b)(1), unless the authorization is terminated or revoked.  Performed at [facility], [address],  [place], [place] [address]   Urine Culture (for pregnant, neutropenic or urologic patients or patients with an indwelling urinary catheter)     Status: None   Collection Time: 11/11/24 11:25 AM   Specimen: Urine, Catheterized  Result Value Ref Range Status   Specimen Description   Final    URINE, CATHETERIZED Performed at [facility], [address] [place], [place] [address]    Special Requests   Final    NONE Performed at [facility], [address], [place], [place] [address]    Culture   Final    NO GROWTH Performed at [facility], [address], [place], [place] [address]    Report Status 11/12/2024 FINAL  Final    Labs: CBC: Recent Labs  Lab 11/08/24 1329 11/09/24 0428 11/10/24 0440  WBC 14.4* 13.3* 12.1*  NEUTROABS 12.5*  --   --   HGB 10.5* 9.9* 9.3*  HCT 32.4* 30.7* 28.3*  MCV 89.8 87.5 88.7  PLT 228 215 210   Basic Metabolic Panel: Recent Labs  Lab 11/08/24 1329 11/09/24 0428 11/10/24 0440 11/11/24 0435  NA 135 139 134* 141  K 4.7 4.2 3.8 4.4  CL 96* 102 99 102  CO2 26 25 25 27   GLUCOSE 216* 98 81 207*  BUN 17 14 13 15   CREATININE 0.69 0.59 0.45 0.67  CALCIUM  9.0 8.4* 8.3* 9.2  MG  --   --  1.6* 2.3  PHOS  --   --  3.1  --    Liver Function Tests: Recent Labs  Lab 11/08/24 1329  AST 36  ALT 40  ALKPHOS 49  BILITOT 1.0  PROT 6.8  ALBUMIN 4.1   CBG: Recent Labs  Lab 11/11/24 1156 11/11/24 1627 11/11/24 2002 11/12/24 0743 11/12/24 1133  GLUCAP 281* 154* 256* 232* 285*    Discharge time spent: greater than 30 minutes.  Signed: Alm Schneider, MD [facility] 11/12/2024

## 2024-11-11 NOTE — Plan of Care (Signed)
  Problem: Acute Rehab OT Goals (only OT should resolve) Goal: Pt. Will Perform Grooming Flowsheets (Taken 11/11/2024 1623) Pt Will Perform Grooming: with modified independence Goal: Pt. Will Perform Upper Body Dressing Flowsheets (Taken 11/11/2024 1623) Pt Will Perform Upper Body Dressing:  with modified independence  with set-up  sitting Goal: Pt. Will Perform Lower Body Dressing Flowsheets (Taken 11/11/2024 1623) Pt Will Perform Lower Body Dressing:  with modified independence  with set-up  sitting/lateral leans Goal: Pt. Will Transfer To Toilet Flowsheets (Taken 11/11/2024 1623) Pt Will Transfer to Toilet:  with mod assist  with transfer board  stand pivot transfer  squat pivot transfer Goal: Pt. Will Perform Toileting-Clothing Manipulation Flowsheets (Taken 11/11/2024 1623) Pt Will Perform Toileting - Clothing Manipulation and hygiene:  with contact guard assist  sitting/lateral leans Goal: Pt/Caregiver Will Perform Home Exercise Program Flowsheets (Taken 11/11/2024 1623) Pt/caregiver will Perform Home Exercise Program:  Increased strength  Both right and left upper extremity  Independently  Levetta Bognar OT, MOT

## 2024-11-11 NOTE — Progress Notes (Signed)
 PROGRESS NOTE  [name] White FMW:[id number] DOB: Nov 26, 1941 DOA: 11/08/2024 PCP: [name], MD  Brief History:  83 year old female with a history of pituitary adenoma, diabetes mellitus type 2, stroke, hypertension, COPD, hypothyroidism, hyperlipidemia, anxiety, PSVT presenting with coughing, chest congestion, shortness of breath.  She was noted to be more confused than usual.  She was seen by her PCP who sent her to the emergency department for further evaluation and treatment.  History is limited secondary to the patient's confusion.  She was most recently hospitalized from 10/11/2024 to 10/16/2024.  She was treated for COPD exacerbation and pneumonia at that time.  She was discharged with cefdinir  and doxycycline for 4 additional days.  The patient was admitted to the hospital from 05/22/2024 to 05/28/2024 for severe sepsis due to pneumonia and VRE UTI.  She was also treated for COPD exacerbation and NKHS.   In the ED, the patient was afebrile but soft blood pressures.  WBC 14.4, hemoglobin 10.5, platelets 220.  Sodium 139, potassium 4.2, bicarbonate 25, serum creatinine 0.59.  Lactic acid 2.5>> 2.1.  Troponin 26>> 21.  COVID-19 PCR is negative.  CT of the chest showed multifocal GGO bilateral with small foci of consolidation.  There was mild mediastinal lymphadenopathy.  She was started on ceftriaxone  and azithromycin .   Assessment/Plan: Sepsis - Present on admission - Presented with tachycardia and leukocytosis - Secondary to pneumonia - Lactic acid 2.5>> 2.1 - Follow blood cultures--neg to date - Continue ceftriaxone  and azithromycin  - COVID-19/RSV/flu negative   Lobar pneumonia - Continue ceftriaxone  and azithromycin  - Speech therapy evaluation - MRSA screen - 11/14 CT chest as discussed above   COPD - Started Brovana  - Started DuoNebs - Started Pulmicort  - started yupelri  -started IV solumedrol   Acute metabolic encephalopathy - Secondary to infectious  process - overall improving   Diabetes mellitus type 2 with hyperglycemia - NovoLog  sliding scale - Holding metformin  - 07/02/2024 hemoglobin A1c 9.8   Mood disorder/anxiety/depression - Continue Effexor  and Trintellix    Mixed hyperlipidemia - Continue statin   Chronic pain - Continue Lyrica    History of stroke - Continue aspirin , statin   Hypothyroidism - Continue Synthroid    Hypomagnesemia -replete     Family Communication: daughter at bedside 11/16   Consultants:  none   Code Status:  DNR   DVT Prophylaxis:  Purcell Lovenox      Procedures: As Listed in Progress Note Above   Antibiotics: Ceftriaxone  11/14>> Azithro 11/14>>          Subjective:  Patient denies fevers, chills, headache, chest pain, dyspnea, nausea, vomiting, diarrhea, abdominal pain, dysuria, hematuria, hematochezia, and melena.  Objective: Vitals:   11/11/24 0817 11/11/24 0819 11/11/24 0822 11/11/24 0825  BP:      Pulse:      Resp:      Temp:      TempSrc:      SpO2: 95% 98% 100% 100%  Weight:      Height:        Intake/Output Summary (Last 24 hours) at 11/11/2024 1216 Last data filed at 11/10/2024 1947 Gross per 24 hour  Intake 240 ml  Output 1800 ml  Net -1560 ml   Weight change:  Exam:  General:  Pt is alert, follows commands appropriately, not in acute distress HEENT: No icterus, No thrush, No neck mass, [place]/AT Cardiovascular: RRR, S1/S2, no rubs, no gallops Respiratory: bibasilar crackles. No wheeze Abdomen: Soft/+BS, non tender, non distended,  no guarding Extremities: No edema, No lymphangitis, No petechiae, No rashes, no synovitis   Data Reviewed: I have personally reviewed following labs and imaging studies Basic Metabolic Panel: Recent Labs  Lab 11/08/24 1329 11/09/24 0428 11/10/24 0440 11/11/24 0435  NA 135 139 134* 141  K 4.7 4.2 3.8 4.4  CL 96* 102 99 102  CO2 26 25 25 27   GLUCOSE 216* 98 81 207*  BUN 17 14 13 15   CREATININE 0.69 0.59 0.45 0.67   CALCIUM  9.0 8.4* 8.3* 9.2  MG  --   --  1.6* 2.3  PHOS  --   --  3.1  --    Liver Function Tests: Recent Labs  Lab 11/08/24 1329  AST 36  ALT 40  ALKPHOS 49  BILITOT 1.0  PROT 6.8  ALBUMIN 4.1   No results for input(s): LIPASE, AMYLASE in the last 168 hours. No results for input(s): AMMONIA in the last 168 hours. Coagulation Profile: No results for input(s): INR, PROTIME in the last 168 hours. CBC: Recent Labs  Lab 11/08/24 1329 11/09/24 0428 11/10/24 0440  WBC 14.4* 13.3* 12.1*  NEUTROABS 12.5*  --   --   HGB 10.5* 9.9* 9.3*  HCT 32.4* 30.7* 28.3*  MCV 89.8 87.5 88.7  PLT 228 215 210   Cardiac Enzymes: No results for input(s): CKTOTAL, CKMB, CKMBINDEX, TROPONINI in the last 168 hours. BNP: Invalid input(s): POCBNP CBG: Recent Labs  Lab 11/10/24 1117 11/10/24 1604 11/10/24 1944 11/11/24 0734 11/11/24 1156  GLUCAP 145* 175* 205* 207* 281*   HbA1C: Recent Labs    11/09/24 0428  HGBA1C 6.5*   Urine analysis:    Component Value Date/Time   COLORURINE YELLOW 05/29/2024 1619   APPEARANCEUR Cloudy (A) 10/02/2024 1005   LABSPEC 1.015 05/29/2024 1619   PHURINE 5.0 05/29/2024 1619   GLUCOSEU Negative 10/02/2024 1005   GLUCOSEU >=1000 (A) 06/25/2020 1441   HGBUR LARGE (A) 05/29/2024 1619   BILIRUBINUR Negative 10/02/2024 1005   KETONESUR NEGATIVE 05/29/2024 1619   PROTEINUR 1+ (A) 10/02/2024 1005   PROTEINUR NEGATIVE 05/29/2024 1619   UROBILINOGEN 0.2 06/25/2020 1441   NITRITE Negative 10/02/2024 1005   NITRITE NEGATIVE 05/29/2024 1619   LEUKOCYTESUR 3+ (A) 10/02/2024 1005   LEUKOCYTESUR SMALL (A) 05/29/2024 1619   Sepsis Labs: @LABRCNTIP (procalcitonin:4,lacticidven:4) ) Recent Results (from the past 240 hours)  Culture, blood (routine x 2)     Status: None (Preliminary result)   Collection Time: 11/08/24  4:27 PM   Specimen: BLOOD  Result Value Ref Range Status   Specimen Description BLOOD BLOOD RIGHT ARM  Final   Special  Requests   Final    BOTTLES DRAWN AEROBIC AND ANAEROBIC Blood Culture adequate volume   Culture   Final    NO GROWTH 3 DAYS Performed at [facility], [address] [place], [place] [address]    Report Status PENDING  Incomplete  Culture, blood (routine x 2)     Status: None (Preliminary result)   Collection Time: 11/08/24  4:27 PM   Specimen: BLOOD  Result Value Ref Range Status   Specimen Description BLOOD BLOOD LEFT ARM  Final   Special Requests   Final    BOTTLES DRAWN AEROBIC AND ANAEROBIC Blood Culture adequate volume   Culture   Final    NO GROWTH 3 DAYS Performed at [facility], [address] [place], [place] [address]    Report Status PENDING  Incomplete  Resp panel by RT-PCR (RSV, Flu A&B, Covid) Anterior  Nasal Swab     Status: None   Collection Time: 11/08/24 10:47 PM   Specimen: Anterior Nasal Swab  Result Value Ref Range Status   SARS Coronavirus 2 by RT PCR NEGATIVE NEGATIVE Final    Comment: (NOTE) SARS-CoV-2 target nucleic acids are NOT DETECTED.  The SARS-CoV-2 RNA is generally detectable in upper respiratory specimens during the acute phase of infection. The lowest concentration of SARS-CoV-2 viral copies this assay can detect is 138 copies/mL. A negative result does not preclude SARS-Cov-2 infection and should not be used as the sole basis for treatment or other patient management decisions. A negative result may occur with  improper specimen collection/handling, submission of specimen other than nasopharyngeal swab, presence of viral mutation(s) within the areas targeted by this assay, and inadequate number of viral copies(<138 copies/mL). A negative result must be combined with clinical observations, patient history, and epidemiological information. The expected result is Negative.  Fact Sheet for Patients:  bloggercourse.com  Fact Sheet for Healthcare Providers:  seriousbroker.it  This test is no t  yet approved or cleared by the [place]  FDA and  has been authorized for detection and/or diagnosis of SARS-CoV-2 by FDA under an Emergency Use Authorization (EUA). This EUA will remain  in effect (meaning this test can be used) for the duration of the COVID-19 declaration under Section 564(b)(1) of the Act, 21 U.S.C.section 360bbb-3(b)(1), unless the authorization is terminated  or revoked sooner.       Influenza A by PCR NEGATIVE NEGATIVE Final   Influenza B by PCR NEGATIVE NEGATIVE Final    Comment: (NOTE) The Xpert Xpress SARS-CoV-2/FLU/RSV plus assay is intended as an aid in the diagnosis of influenza from Nasopharyngeal swab specimens and should not be used as a sole basis for treatment. Nasal washings and aspirates are unacceptable for Xpert Xpress SARS-CoV-2/FLU/RSV testing.  Fact Sheet for Patients: bloggercourse.com  Fact Sheet for Healthcare Providers: seriousbroker.it  This test is not yet approved or cleared by the [place]  FDA and has been authorized for detection and/or diagnosis of SARS-CoV-2 by FDA under an Emergency Use Authorization (EUA). This EUA will remain in effect (meaning this test can be used) for the duration of the COVID-19 declaration under Section 564(b)(1) of the Act, 21 U.S.C. section 360bbb-3(b)(1), unless the authorization is terminated or revoked.     Resp Syncytial Virus by PCR NEGATIVE NEGATIVE Final    Comment: (NOTE) Fact Sheet for Patients: bloggercourse.com  Fact Sheet for Healthcare Providers: seriousbroker.it  This test is not yet approved or cleared by the [place]  FDA and has been authorized for detection and/or diagnosis of SARS-CoV-2 by FDA under an Emergency Use Authorization (EUA). This EUA will remain in effect (meaning this test can be used) for the duration of the COVID-19 declaration under Section 564(b)(1)  of the Act, 21 U.S.C. section 360bbb-3(b)(1), unless the authorization is terminated or revoked.  Performed at [facility], [address] [place], [place] [address]      Scheduled Meds:  albuterol   2.5 mg Nebulization Q8H   arformoterol   15 mcg Nebulization BID   aspirin  EC  81 mg Oral Daily   atorvastatin   80 mg Oral Daily   budesonide  (PULMICORT ) nebulizer solution  0.5 mg Nebulization BID   enoxaparin  (LOVENOX ) injection  40 mg Subcutaneous QHS   feeding supplement  237 mL Oral BID BM   guaiFENesin   600 mg Oral BID   insulin  aspart  0-15 Units Subcutaneous TID WC   insulin  aspart  0-5 Units Subcutaneous QHS   levothyroxine   50 mcg Oral Q0600   lidocaine   1 patch Transdermal Q24H   methylPREDNISolone  (SOLU-MEDROL ) injection  60 mg Intravenous BID   pregabalin   150 mg Oral BID   revefenacin   175 mcg Nebulization Daily   venlafaxine  XR  150 mg Oral Daily   vitamin B-12  500 mcg Oral Daily   vortioxetine  HBr  10 mg Oral Daily   Continuous Infusions:  azithromycin  500 mg (11/10/24 1756)   cefTRIAXone  (ROCEPHIN )  IV 2 g (11/10/24 1654)    Procedures/Studies: CT Chest Wo Contrast Result Date: 11/08/2024 CLINICAL DATA:  Fever nonproductive cough EXAM: CT CHEST WITHOUT CONTRAST TECHNIQUE: Multidetector CT imaging of the chest was performed following the standard protocol without IV contrast. RADIATION DOSE REDUCTION: This exam was performed according to the departmental dose-optimization program which includes automated exposure control, adjustment of the mA and/or kV according to patient size and/or use of iterative reconstruction technique. COMPARISON:  Chest x-ray 11/08/2024, chest CT 10/11/2024, 05/22/2024 FINDINGS: Cardiovascular: Limited assessment without intravenous contrast. Mild aortic atherosclerosis. No aneurysm. Mitral calcification. Mild aortic valvular calcification. Normal cardiac size. No pericardial effusion Mediastinum/Nodes: Patent trachea. No thyroid  mass. Mildly  prominent precarinal node measuring 11 mm. Limited assessment for hilar nodes in the absence of contrast. Subcarinal lymph node measures 14 mm. Esophagus within normal limits. Lungs/Pleura: Trace right-sided pleural effusion. Multifocal bilateral ground-glass densities and small foci of consolidation. Upper Abdomen: No acute finding. Stable 11 mm left adrenal nodule consistent with adenoma. Musculoskeletal: Moderate superior endplate deformity T12, stable compared with CT earlier this month, new compared with CT from May. Suspicion of mild paravertebral hazy density as may be seen with subacute fracture. IMPRESSION: 1. Multifocal bilateral ground-glass densities and small foci of consolidation, suspect for multifocal pneumonia. Trace right-sided pleural effusion. 2. Mildly prominent mediastinal lymph nodes, likely reactive. 3. Moderate superior endplate deformity at T12, stable compared with CT earlier this month, new compared with CT from May. Suspicion of mild paravertebral hazy density as may be seen with subacute fracture. 4. Aortic atherosclerosis. Aortic Atherosclerosis (ICD10-I70.0). Electronically Signed   By: Luke Bun M.D.   On: 11/08/2024 18:03   DG Chest Portable 1 View Result Date: 11/08/2024 EXAM: 1 VIEW(S) XRAY OF THE CHEST 11/08/2024 04:05:00 PM COMPARISON: 10/11/2024 CLINICAL HISTORY: SOB, cough. FINDINGS: LUNGS AND PLEURA: Stable minimal interstitial densities are noted which may represent scarring but minimal pulmonary edema cannot be excluded. Minimal pleural effusions may be present. No pneumothorax. HEART AND MEDIASTINUM: Stable cardiomediastinal silhouette. BONES AND SOFT TISSUES: No acute osseous abnormality. IMPRESSION: 1. Stable minimal interstitial densities, possibly representing scarring or minimal pulmonary edema. 2. Stable minimal pleural effusions. Electronically signed by: Lynwood Seip MD 11/08/2024 04:18 PM EST RP Workstation: [id number]   DG BONE DENSITY (DXA) Result  Date: 10/30/2024 EXAM: DUAL X-RAY ABSORPTIOMETRY (DXA) FOR BONE MINERAL DENSITY 10/30/2024 4:39 pm CLINICAL DATA:  83 year old Female Postmenopausal. Hypoestrogen needs dxa Patient is or has been on glucocorticoid therapy. Patient is or has been on bone building therapies. TECHNIQUE: An axial (e.g., hips, spine) and/or appendicular (e.g., radius) exam was performed, as appropriate, using [employer] Secretary/administrator at [employer]. Images are obtained for bone mineral density measurement and are not obtained for diagnostic purposes. [id number] Exclusions: None. COMPARISON:  None. New baseline. FINDINGS: Scan quality: Good. LUMBAR SPINE (L1-L4): BMD (in g/cm2): 1.144 T-score: -0.3 Z-score: 1.6 LEFT FEMORAL NECK: BMD (in g/cm2): 0.962 T-score: -0.5 Z-score: 1.7 LEFT TOTAL HIP: BMD (in  g/cm2): 0.970 T-score: -0.3 Z-score: 1.9 RIGHT FEMORAL NECK: BMD (in g/cm2): 1.016 T-score: -0.2 Z-score: 2.1 RIGHT TOTAL HIP: BMD (in g/cm2): 0.994 T-score: -0.1 Z-score: 2.1 FRAX 10-YEAR PROBABILITY OF FRACTURE: FRAX not reported as the lowest BMD is not in the osteopenia range. IMPRESSION: Normal based on BMD. Fracture risk is unknown due to history of bone building therapy. RECOMMENDATIONS: 1. All patients should optimize calcium  and vitamin D  intake. 2. Consider FDA-approved medical therapies in postmenopausal women and men aged 73 years and older, based on the following: - A hip or vertebral (clinical or morphometric) fracture - T-score less than or equal to -2.5 and secondary causes have been excluded. - Low bone mass (T-score between -1.0 and -2.5) and a 10-year probability of a hip fracture greater than or equal to 3% or a 10-year probability of a major osteoporosis-related fracture greater than or equal to 20% based on the [place] -adapted WHO algorithm. - Clinician judgment and/or patient preferences may indicate treatment for people with 10-year fracture probabilities above or below these levels 3. Patients with  diagnosis of osteoporosis or at high risk for fracture should have regular bone mineral density tests. For patients eligible for Medicare, routine testing is allowed once every 2 years. The testing frequency can be increased to one year for patients who have rapidly progressing disease, those who are receiving or discontinuing medical therapy to restore bone mass, or have additional risk factors. Electronically Signed   By: Dina  Arceo M.D.   On: 10/30/2024 19:02   MR BRAIN W WO CONTRAST Result Date: 10/25/2024 EXAM: MRI BRAIN WITH AND WITHOUT CONTRAST 10/25/2024 12:06:05 PM TECHNIQUE: Multiplanar multisequence MRI of the head/brain was performed with and without the administration of intravenous contrast. COMPARISON: 03/11/2024 and 10/28/2022 brain MRI. CLINICAL HISTORY: Brain/CNS neoplasm, assess treatment response. FINDINGS: BRAIN AND VENTRICLES: No acute infarct. No acute intracranial hemorrhage. No mass effect or midline shift. No hydrocephalus. Normal flow voids. No mass or abnormal enhancement. Old bilateral cerebellar infarcts. Old small vessel infarcts of the corona radiata and centrum semiovale. Generalized volume loss. Multifocal hyperintense T2-weighted signal within the cerebral white matter, most commonly due to chronic small vessel disease. 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 PITUITARY: Intrasellar/suprasellar mass measures 2.4 x 1.8 x 1.5 cm (AP x CC x Transverse), unchanged compared to 10/28/2022. Mild mass effect on the cisternal segment of the right optic nerve. Grade 3 cavernous sinus invasion on the right. Cavernous carotid flow voids are maintained. The infundibulum is deviated superiorly. ORBITS: Ocular lens replacements. No acute abnormality. SINUSES: No acute abnormality. BONES AND SOFT TISSUES: Normal bone marrow signal and enhancement. No acute soft tissue abnormality. IMPRESSION: 1. Intrasellar/suprasellar macroadenoma measuring 2.4 x 1.8 x 1.5 cm with mild mass effect  on the cisternal segment of the right optic nerve, grade 3 right cavernous sinus invasion, and superior deviation of the infundibulum; unchanged from 10/28/2022. 2. Old bilateral cerebellar infarcts and chronic small vessel disease-related white matter changes. Electronically signed by: Franky Stanford MD 10/25/2024 01:08 PM EDT RP Workstation: [id number]   CT HEAD WO CONTRAST ( ) Result Date: 10/12/2024 EXAM: CT HEAD WITHOUT CONTRAST 10/12/2024 05:57:18 PM TECHNIQUE: CT of the head was performed without the administration of intravenous contrast. Automated exposure control, iterative reconstruction, and/or weight based adjustment of the mA/kV was utilized to reduce the radiation dose to as low as reasonably achievable. COMPARISON: Comparison made with prior CT from 07/04/2024 as well as earlier studies. CLINICAL HISTORY: Mental status change, unknown cause. FINDINGS: BRAIN AND  VENTRICLES: Patchy hyperdensity involving the supratentorial cerebral white matter consistent with chronic 2-vessel ischemic disease, stable. Few small remote cerebellar infarcts, right greater than left, also stable. No acute intracranial hemorrhage. No visible acute large vessel territory infarct. No hydrocephalus. No extra-axial collection. No mass effect or midline shift. 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 No abnormal hyperdense vessel. Calcified atherosclerosis present about the skull base. ORBITS: No acute abnormality. SINUSES: No acute abnormality. SOFT TISSUES AND SKULL: No acute soft tissue abnormality. No skull fracture. IMPRESSION: 1. No acute intracranial abnormality. 2. 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 3. Chronic microvascular ischemic disease with a few small remote cerebellar infarcts, stable. Electronically signed by: Morene Hoard MD 10/12/2024 06:33 PM  EDT RP Workstation: [id number] Alm Schneider, DO  [facility]  If 7PM-7AM, please contact night-coverage [email or web address] Password [email or web address] 11/11/2024, 12:16 PM   LOS: 3 days

## 2024-11-11 NOTE — Care Management Important Message (Signed)
 Important Message  Patient Details  Name: [name] White MRN: [id number] Date of Birth: Oct 19, 1941   Important Message Given:  Yes - Medicare IM     [name] L [name] 11/11/2024, 2:03 PM

## 2024-11-12 LAB — URINE CULTURE: Culture: NO GROWTH

## 2024-11-12 LAB — GLUCOSE, CAPILLARY
Glucose-Capillary: 232 mg/dL — ABNORMAL HIGH (ref 70–99)
Glucose-Capillary: 285 mg/dL — ABNORMAL HIGH (ref 70–99)

## 2024-11-12 MED ORDER — AZITHROMYCIN 500 MG PO TABS: 500.0000 mg | ORAL_TABLET | Freq: Every day | ORAL | 0 refills | Status: DC

## 2024-11-12 MED ORDER — PREDNISONE 10 MG PO TABS: 60.0000 mg | ORAL_TABLET | Freq: Every day | ORAL | 0 refills | Status: DC

## 2024-11-12 MED ORDER — FUROSEMIDE 20 MG PO TABS: 20.0000 mg | ORAL_TABLET | Freq: Every day | ORAL | Status: DC

## 2024-11-12 MED ORDER — PREDNISONE 20 MG PO TABS: 60.0000 mg | ORAL_TABLET | Freq: Every day | ORAL | Status: DC

## 2024-11-12 MED ORDER — CEFDINIR 300 MG PO CAPS: 300.0000 mg | ORAL_CAPSULE | Freq: Two times a day (BID) | ORAL | 0 refills | Status: DC

## 2024-11-12 MED ORDER — ORAL CARE MOUTH RINSE: 15.0000 mL | OROMUCOSAL | Status: DC | PRN

## 2024-11-12 MED ORDER — AZITHROMYCIN 250 MG PO TABS
500.0000 mg | ORAL_TABLET | Freq: Every day | ORAL | Status: DC
  Administered 2024-11-12: 500 mg via ORAL
  Filled 2024-11-12: qty 2

## 2024-11-12 NOTE — [place] FL2 (Signed)
 [place]  MEDICAID FL2 LEVEL OF CARE FORM     IDENTIFICATION  Patient Name: [name] White Birthdate: 12-29-1940 Sex: female Admission Date (Current Location): 11/08/2024  [facility] and [place] Number:  [employer] and Address:  [facility],  [address] [place] [address]      Provider Number: [id number]  Attending Physician Name and Address:  Evonnie Lenis, MD  Relative Name and Phone Number:       Current Level of Care: Hospital Recommended Level of Care: Assisted Living Facility Prior Approval Number:    Date Approved/Denied:   PASRR Number:    Discharge Plan: Other (Comment) (ALF)    Current Diagnoses: Patient Active Problem List   Diagnosis Date Noted   COPD with acute exacerbation (HCC) 11/10/2024   Lobar pneumonia 11/09/2024   Uncontrolled type 2 diabetes mellitus with hyperglycemia, with long-term current use of insulin  (HCC) 11/09/2024   CAP (community acquired pneumonia) 10/11/2024   Abnormal gait 07/10/2024   Cervical radiculopathy 07/10/2024   Chronic neck pain 07/10/2024   Visual disturbance 07/10/2024   Recurrent UTI 07/09/2024   Sepsis due to undetermined organism (HCC) 05/22/2024   Hypomagnesemia 05/01/2024   Acute respiratory failure with hypoxemia (HCC) 04/24/2024   CVA (cerebral vascular accident) (HCC) 03/12/2024   Anxiety and depression 03/12/2024   GERD without esophagitis 03/12/2024   Rhabdomyolysis 12/27/2023   Pressure injury of skin 12/27/2023   Acute metabolic encephalopathy 12/26/2023   Status post transsphenoidal pituitary resection 06/04/2020   Pituitary adenoma with extrasellar extension (HCC) 06/04/2020   Pituitary tumor 05/29/2020   Dizzinesses 02/02/2020   Vitamin D  deficiency 01/28/2020   Paroxysmal supraventricular tachycardia 06/19/2014   Rectocele 03/19/2013   Constipation 03/19/2013   HYPERCHOLESTEROLEMIA 02/09/2009   Essential hypertension 02/09/2009   Osteoporosis 02/09/2009   Post-menopausal  02/09/2009   HEAT INTOLERANCE 02/01/2008   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   Hypothyroidism 09/14/2007   Migraine headache 09/14/2007   SUPERFICIAL PHLEBITIS 09/14/2007   MENOPAUSAL SYNDROME 09/14/2007   FATIGUE 09/14/2007   Type 2 diabetes mellitus with peripheral neuropathy (HCC) 09/14/2007    Orientation RESPIRATION BLADDER Height & Weight     Self  Normal Incontinent Weight: 130 lb (59 kg) Height:  5' 7 (170.2 cm)  BEHAVIORAL SYMPTOMS/MOOD NEUROLOGICAL BOWEL NUTRITION STATUS      Continent Diet (Regular diet with no added table salt)  AMBULATORY STATUS COMMUNICATION OF NEEDS Skin   Limited Assist Verbally Normal                       Personal Care Assistance Level of Assistance  Bathing, Feeding, Dressing Bathing Assistance: Maximum assistance Feeding assistance: Limited assistance Dressing Assistance: Maximum assistance     Functional Limitations Info  Sight, Hearing, Speech Sight Info: Impaired Hearing Info: Adequate Speech Info: Adequate    SPECIAL CARE FACTORS FREQUENCY                       Contractures Contractures Info: Not present    Additional Factors Info  Code Status, Allergies Code Status Info: DNR- Limited Allergies Info: Betadine, Povidone-Iodine, Penicillins           Current Medications (11/12/2024):  This is the current hospital active medication list Current Facility-Administered Medications  Medication Dose Route Frequency Provider Last Rate Last Admin   albuterol  (PROVENTIL ) (2.5 MG/3ML) 0.083% nebulizer solution 2.5 mg  2.5 mg Nebulization Q2H PRN Stinson, Jacob J, DO       albuterol  (  PROVENTIL ) (2.5 MG/3ML) 0.083% nebulizer solution 2.5 mg  2.5 mg Nebulization TID Tat, Alm, MD   2.5 mg at 11/12/24 9188   arformoterol  (BROVANA ) nebulizer solution 15 mcg  15 mcg Nebulization BID Tat, Alm, MD   15 mcg at 11/12/24 0815   aspirin  EC tablet 81 mg  81 mg Oral Daily Stinson, Jacob J, DO   81 mg at 11/12/24 9161   atorvastatin   (LIPITOR) tablet 80 mg  80 mg Oral Daily Stinson, Jacob J, DO   80 mg at 11/12/24 9161   azithromycin  (ZITHROMAX ) tablet 500 mg  500 mg Oral Daily Tat, David, MD       budesonide  (PULMICORT ) nebulizer solution 0.5 mg  0.5 mg Nebulization BID Tat, Alm, MD   0.5 mg at 11/12/24 0813   busPIRone (BUSPAR) tablet 5 mg  5 mg Oral TID PRN Stinson, Jacob J, DO   5 mg at 11/12/24 0015   cefTRIAXone  (ROCEPHIN ) 2 g in sodium chloride  0.9 % 100 mL IVPB  2 g Intravenous Q24H Stinson, Jacob J, DO 200 mL/hr at 11/11/24 1715 2 g at 11/11/24 1715   enoxaparin  (LOVENOX ) injection 40 mg  40 mg Subcutaneous QHS Stinson, Jacob J, DO   40 mg at 11/11/24 2043   feeding supplement (ENSURE PLUS HIGH PROTEIN) liquid 237 mL  237 mL Oral BID BM Evonnie Alm, MD   237 mL at 11/12/24 0840   guaiFENesin  (MUCINEX ) 12 hr tablet 600 mg  600 mg Oral BID Stinson, Jacob J, DO   600 mg at 11/12/24 9161   insulin  aspart (novoLOG ) injection 0-15 Units  0-15 Units Subcutaneous TID WC Stinson, Jacob J, DO   5 Units at 11/12/24 9161   insulin  aspart (novoLOG ) injection 0-5 Units  0-5 Units Subcutaneous QHS Stinson, Jacob J, DO   3 Units at 11/11/24 2043   insulin  aspart (novoLOG ) injection 5 Units  5 Units Subcutaneous TID WC Evonnie Alm, MD   5 Units at 11/12/24 9161   levothyroxine  (SYNTHROID ) tablet 50 mcg  50 mcg Oral Q0600 Stinson, Jacob J, DO   50 mcg at 11/12/24 9473   lidocaine  (LIDODERM ) 5 % 1 patch  1 patch Transdermal Q24H Evonnie Alm, MD   1 patch at 11/11/24 1447   midodrine  (PROAMATINE ) tablet 5 mg  5 mg Oral TID WC Evonnie Alm, MD   5 mg at 11/12/24 9161   ondansetron  (ZOFRAN ) tablet 4 mg  4 mg Oral Q6H PRN Stinson, Jacob J, DO       Or   ondansetron  (ZOFRAN ) injection 4 mg  4 mg Intravenous Q6H PRN Stinson, Jacob J, DO       Oral care mouth rinse  15 mL Mouth Rinse PRN Tat, Alm, MD       NOREEN ON 11/13/2024] predniSONE  (DELTASONE ) tablet 60 mg  60 mg Oral Q breakfast Tat, David, MD       pregabalin  (LYRICA ) capsule 150 mg   150 mg Oral BID Stinson, Jacob J, DO   150 mg at 11/12/24 9161   revefenacin  (YUPELRI ) nebulizer solution 175 mcg  175 mcg Nebulization Daily Tat, Alm, MD   175 mcg at 11/12/24 9182   tiZANidine (ZANAFLEX) tablet 2 mg  2 mg Oral BID PRN Stinson, Jacob J, DO       traZODone  (DESYREL ) tablet 50 mg  50 mg Oral QHS PRN Stinson, Jacob J, DO   50 mg at 11/11/24 2043   venlafaxine  XR (EFFEXOR -XR) 24 hr capsule 150 mg  150  mg Oral Daily Stinson, Jacob J, DO   150 mg at 11/12/24 9161   vitamin B-12 (CYANOCOBALAMIN ) tablet 500 mcg  500 mcg Oral Daily Tat, Alm, MD   500 mcg at 11/12/24 9161   vortioxetine  HBr (TRINTELLIX ) tablet 10 mg  10 mg Oral Daily Tat, Alm, MD   10 mg at 11/12/24 9161     Discharge Medications: Allergies as of 11/12/2024       Reactions   Betadine [povidone Iodine] Hives   Povidone-iodine Hives   Penicillins Rash, Dermatitis        Medication List     TAKE these medications    acetaminophen  325 MG tablet Commonly known as: TYLENOL  Take 2 tablets (650 mg total) by mouth every 6 (six) hours as needed for mild pain (pain score 1-3) (or Fever >/= 101).   albuterol  108 (90 Base) MCG/ACT inhaler Commonly known as: VENTOLIN  HFA Inhale 2 puffs into the lungs every 4 (four) hours as needed for wheezing or shortness of breath.   aspirin  EC 81 MG tablet Take 81 mg by mouth daily. Swallow whole.   atorvastatin  40 MG tablet Commonly known as: LIPITOR Take 80 mg by mouth daily.   azithromycin  500 MG tablet Commonly known as: ZITHROMAX  Take 1 tablet (500 mg total) by mouth daily.   busPIRone 5 MG tablet Commonly known as: BUSPAR Take 5 mg by mouth 3 (three) times daily as needed (anxiety).   cefdinir  300 MG capsule Commonly known as: OMNICEF  Take 1 capsule (300 mg total) by mouth 2 (two) times daily.   cyanocobalamin  1000 MCG/ML injection Commonly known as: VITAMIN B12 Inject 1,000 mcg into the muscle every 30 (thirty) days.   cycloSPORINE  0.05 % ophthalmic  emulsion Commonly known as: RESTASIS  Place 1 drop into both eyes 2 (two) times daily.   fluticasone  furoate-vilanterol 200-25 MCG/ACT Aepb Commonly known as: Breo Ellipta  Inhale 1 puff into the lungs daily.   furosemide  20 MG tablet Commonly known as: Lasix  Take 1 tablet (20 mg total) by mouth daily as needed for edema or fluid. What changed: when to take this   Imvexxy  Maintenance Pack 10 MCG Inst Generic drug: Estradiol  Place 1 suppository vaginally 2 (two) times a week. What changed: additional instructions   ipratropium-albuterol  0.5-2.5 (3) MG/3ML Soln Commonly known as: DUONEB Take 3 mLs by nebulization in the morning, at noon, and at bedtime. What changed: Another medication with the same name was changed. Make sure you understand how and when to take each.   ipratropium-albuterol  0.5-2.5 (3) MG/3ML Soln Commonly known as: DUONEB Take 3 mLs by nebulization every 4 (four) hours as needed (wheezing and SOB). What changed: reasons to take this   levothyroxine  50 MCG tablet Commonly known as: SYNTHROID  Take 50 mcg by mouth daily.   metFORMIN  1000 MG tablet Commonly known as: GLUCOPHAGE  Take 1,000 mg by mouth 2 (two) times daily.   midodrine  5 MG tablet Commonly known as: PROAMATINE  Take 5 mg by mouth 3 (three) times daily with meals.   nitrofurantoin  100 MG capsule Commonly known as: Macrodantin  Take 1 capsule (100 mg total) by mouth daily.   NONFORMULARY OR COMPOUNDED ITEM If patient is incontinent of urine and/or stool, SNF staff are instructed to cleanse patient's genital area at least once every 8 hours and change diaper if soiled.   NONFORMULARY OR COMPOUNDED ITEM SNF staff are instructed to encourage hydration with water  at least once every 2-4 hours while patient is awake.   NONFORMULARY OR COMPOUNDED ITEM  SNF staff are instructed to prompt toileting and assist patient with ambulation to toilet at least once every 4-6 hours while awake.   omeprazole 20  MG capsule Commonly known as: PRILOSEC Take 1 capsule (20 mg total) by mouth 2 (two) times daily before a meal.   polyethylene glycol 17 g packet Commonly known as: MIRALAX  / GLYCOLAX  Take 17 g by mouth daily as needed for mild constipation.   potassium chloride  10 MEQ tablet Commonly known as: KLOR-CON  Take 10 mEq by mouth daily.   predniSONE  5 MG tablet Commonly known as: DELTASONE  Take 1 tablet (5 mg total) by mouth daily with breakfast. Restart the day after prednisone  taper is done What changed: additional instructions   pregabalin  150 MG capsule Commonly known as: LYRICA  Take 1 capsule (150 mg total) by mouth 2 (two) times daily.   rizatriptan  10 MG disintegrating tablet Commonly known as: MAXALT -MLT Take 10 mg by mouth as needed for migraine.   Thera-M Tabs Take 1 tablet by mouth daily.   tiZANidine 2 MG tablet Commonly known as: ZANAFLEX Take 2 mg by mouth 2 (two) times daily as needed for muscle spasms.   traZODone  50 MG tablet Commonly known as: DESYREL  Take 1 tablet (50 mg total) by mouth at bedtime as needed for sleep.   Trintellix  10 MG Tabs tablet Generic drug: vortioxetine  HBr Take 10 mg by mouth daily.   umeclidinium bromide  62.5 MCG/ACT Aepb Commonly known as: INCRUSE ELLIPTA  Inhale 1 puff into the lungs daily.   venlafaxine  XR 150 MG 24 hr capsule Commonly known as: EFFEXOR -XR Take 150 mg by mouth every morning.        Relevant Imaging Results:  Relevant Lab Results:   Additional Information SSN: [id number], [place]

## 2024-11-12 NOTE — Plan of Care (Signed)
  Problem: Education: Goal: Knowledge of General Education information will improve Description: Including pain rating scale, medication(s)/side effects and non-pharmacologic comfort measures Outcome: Progressing   Problem: Clinical Measurements: Goal: Ability to maintain clinical measurements within normal limits will improve Outcome: Progressing   Problem: Activity: Goal: Risk for activity intolerance will decrease Outcome: Progressing   Problem: Coping: Goal: Level of anxiety will decrease Outcome: Progressing   Problem: Pain Managment: Goal: General experience of comfort will improve and/or be controlled Outcome: Progressing   Problem: Safety: Goal: Ability to remain free from injury will improve Outcome: Progressing   Problem: Activity: Goal: Ability to tolerate increased activity will improve Outcome: Progressing   Problem: Clinical Measurements: Goal: Ability to maintain a body temperature in the normal range will improve Outcome: Progressing   Problem: Metabolic: Goal: Ability to maintain appropriate glucose levels will improve Outcome: Progressing

## 2024-11-12 NOTE — NC FL2 (Signed)
 [place]  MEDICAID FL2 LEVEL OF CARE FORM     IDENTIFICATION  Patient Name: [name] White Birthdate: Apr 11, 1941 Sex: female Admission Date (Current Location): 11/08/2024  [facility] and [place] Number:  [employer] and Address:  [facility],  [address] [place] [address]      Provider Number: [id number]  Attending Physician Name and Address:  Evonnie Lenis, MD  Relative Name and Phone Number:       Current Level of Care: Hospital Recommended Level of Care: Assisted Living Facility Prior Approval Number:    Date Approved/Denied:   PASRR Number:    Discharge Plan: Other (Comment) (ALF)    Current Diagnoses: Patient Active Problem List   Diagnosis Date Noted   COPD with acute exacerbation (HCC) 11/10/2024   Lobar pneumonia 11/09/2024   Uncontrolled type 2 diabetes mellitus with hyperglycemia, with long-term current use of insulin  (HCC) 11/09/2024   CAP (community acquired pneumonia) 10/11/2024   Abnormal gait 07/10/2024   Cervical radiculopathy 07/10/2024   Chronic neck pain 07/10/2024   Visual disturbance 07/10/2024   Recurrent UTI 07/09/2024   Sepsis due to undetermined organism (HCC) 05/22/2024   Hypomagnesemia 05/01/2024   Acute respiratory failure with hypoxemia (HCC) 04/24/2024   CVA (cerebral vascular accident) (HCC) 03/12/2024   Anxiety and depression 03/12/2024   GERD without esophagitis 03/12/2024   Rhabdomyolysis 12/27/2023   Pressure injury of skin 12/27/2023   Acute metabolic encephalopathy 12/26/2023   Status post transsphenoidal pituitary resection 06/04/2020   Pituitary adenoma with extrasellar extension (HCC) 06/04/2020   Pituitary tumor 05/29/2020   Dizzinesses 02/02/2020   Vitamin D  deficiency 01/28/2020   Paroxysmal supraventricular tachycardia 06/19/2014   Rectocele 03/19/2013   Constipation 03/19/2013   HYPERCHOLESTEROLEMIA 02/09/2009   Essential hypertension 02/09/2009   Osteoporosis 02/09/2009   Post-menopausal  02/09/2009   HEAT INTOLERANCE 02/01/2008   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   Hypothyroidism 09/14/2007   Migraine headache 09/14/2007   SUPERFICIAL PHLEBITIS 09/14/2007   MENOPAUSAL SYNDROME 09/14/2007   FATIGUE 09/14/2007   Type 2 diabetes mellitus with peripheral neuropathy (HCC) 09/14/2007    Orientation RESPIRATION BLADDER Height & Weight     Self  Normal Incontinent Weight: 130 lb (59 kg) Height:  5' 7 (170.2 cm)  BEHAVIORAL SYMPTOMS/MOOD NEUROLOGICAL BOWEL NUTRITION STATUS      Continent Diet (Regular diet with no added table salt)  AMBULATORY STATUS COMMUNICATION OF NEEDS Skin   Limited Assist Verbally Normal                       Personal Care Assistance Level of Assistance  Bathing, Feeding, Dressing Bathing Assistance: Maximum assistance Feeding assistance: Limited assistance Dressing Assistance: Maximum assistance     Functional Limitations Info  Sight, Hearing, Speech Sight Info: Impaired Hearing Info: Adequate Speech Info: Adequate    SPECIAL CARE FACTORS FREQUENCY  PT (By licensed PT)     PT Frequency: PT 3 times weekly              Contractures Contractures Info: Not present    Additional Factors Info  Code Status, Allergies Code Status Info: DNR- Limited Allergies Info: Betadine, Povidone-Iodine, Penicillins           Current Medications (11/12/2024):  This is the current hospital active medication list Current Facility-Administered Medications  Medication Dose Route Frequency Provider Last Rate Last Admin   albuterol  (PROVENTIL ) (2.5 MG/3ML) 0.083% nebulizer solution 2.5 mg  2.5 mg Nebulization Q2H PRN Stinson, Jacob J, DO  albuterol  (PROVENTIL ) (2.5 MG/3ML) 0.083% nebulizer solution 2.5 mg  2.5 mg Nebulization TID Tat, Alm, MD   2.5 mg at 11/12/24 9188   arformoterol  (BROVANA ) nebulizer solution 15 mcg  15 mcg Nebulization BID Tat, Alm, MD   15 mcg at 11/12/24 0815   aspirin  EC tablet 81 mg  81 mg Oral Daily Stinson, Jacob  J, DO   81 mg at 11/12/24 9161   atorvastatin  (LIPITOR) tablet 80 mg  80 mg Oral Daily Stinson, Jacob J, DO   80 mg at 11/12/24 9161   azithromycin  (ZITHROMAX ) tablet 500 mg  500 mg Oral Daily Tat, Alm, MD   500 mg at 11/12/24 1324   budesonide  (PULMICORT ) nebulizer solution 0.5 mg  0.5 mg Nebulization BID Tat, Alm, MD   0.5 mg at 11/12/24 0813   busPIRone (BUSPAR) tablet 5 mg  5 mg Oral TID PRN Stinson, Jacob J, DO   5 mg at 11/12/24 0015   cefTRIAXone  (ROCEPHIN ) 2 g in sodium chloride  0.9 % 100 mL IVPB  2 g Intravenous Q24H Stinson, Jacob J, DO 200 mL/hr at 11/11/24 1715 2 g at 11/11/24 1715   enoxaparin  (LOVENOX ) injection 40 mg  40 mg Subcutaneous QHS Stinson, Jacob J, DO   40 mg at 11/11/24 2043   feeding supplement (ENSURE PLUS HIGH PROTEIN) liquid 237 mL  237 mL Oral BID BM Evonnie Alm, MD   237 mL at 11/12/24 0840   guaiFENesin  (MUCINEX ) 12 hr tablet 600 mg  600 mg Oral BID Stinson, Jacob J, DO   600 mg at 11/12/24 9161   insulin  aspart (novoLOG ) injection 0-15 Units  0-15 Units Subcutaneous TID WC Stinson, Jacob J, DO   8 Units at 11/12/24 1324   insulin  aspart (novoLOG ) injection 0-5 Units  0-5 Units Subcutaneous QHS Stinson, Jacob J, DO   3 Units at 11/11/24 2043   insulin  aspart (novoLOG ) injection 5 Units  5 Units Subcutaneous TID WC Evonnie Alm, MD   5 Units at 11/12/24 1324   levothyroxine  (SYNTHROID ) tablet 50 mcg  50 mcg Oral Q0600 Stinson, Jacob J, DO   50 mcg at 11/12/24 9473   lidocaine  (LIDODERM ) 5 % 1 patch  1 patch Transdermal Q24H Evonnie Alm, MD   1 patch at 11/11/24 1447   midodrine  (PROAMATINE ) tablet 5 mg  5 mg Oral TID WC Evonnie Alm, MD   5 mg at 11/12/24 1324   ondansetron  (ZOFRAN ) tablet 4 mg  4 mg Oral Q6H PRN Stinson, Jacob J, DO       Or   ondansetron  (ZOFRAN ) injection 4 mg  4 mg Intravenous Q6H PRN Stinson, Jacob J, DO       Oral care mouth rinse  15 mL Mouth Rinse PRN Tat, Alm, MD       NOREEN ON 11/13/2024] predniSONE  (DELTASONE ) tablet 60 mg  60 mg Oral Q  breakfast Tat, Alm, MD       pregabalin  (LYRICA ) capsule 150 mg  150 mg Oral BID Stinson, Jacob J, DO   150 mg at 11/12/24 9161   revefenacin  (YUPELRI ) nebulizer solution 175 mcg  175 mcg Nebulization Daily Tat, Alm, MD   175 mcg at 11/12/24 9182   tiZANidine (ZANAFLEX) tablet 2 mg  2 mg Oral BID PRN Stinson, Jacob J, DO       traZODone  (DESYREL ) tablet 50 mg  50 mg Oral QHS PRN Stinson, Jacob J, DO   50 mg at 11/11/24 2043   venlafaxine  XR (EFFEXOR -XR) 24 hr capsule  150 mg  150 mg Oral Daily Stinson, Jacob J, DO   150 mg at 11/12/24 9161   vitamin B-12 (CYANOCOBALAMIN ) tablet 500 mcg  500 mcg Oral Daily Tat, Alm, MD   500 mcg at 11/12/24 9161   vortioxetine  HBr (TRINTELLIX ) tablet 10 mg  10 mg Oral Daily Tat, Alm, MD   10 mg at 11/12/24 9161     Discharge Medications: Allergies as of 11/12/2024       Reactions   Betadine [povidone Iodine] Hives   Povidone-iodine Hives   Penicillins Rash, Dermatitis        Medication List     TAKE these medications    acetaminophen  325 MG tablet Commonly known as: TYLENOL  Take 2 tablets (650 mg total) by mouth every 6 (six) hours as needed for mild pain (pain score 1-3) (or Fever >/= 101).   albuterol  108 (90 Base) MCG/ACT inhaler Commonly known as: VENTOLIN  HFA Inhale 2 puffs into the lungs every 4 (four) hours as needed for wheezing or shortness of breath.   aspirin  EC 81 MG tablet Take 81 mg by mouth daily. Swallow whole.   atorvastatin  40 MG tablet Commonly known as: LIPITOR Take 80 mg by mouth daily.   azithromycin  500 MG tablet Commonly known as: ZITHROMAX  Take 1 tablet (500 mg total) by mouth daily.   busPIRone 5 MG tablet Commonly known as: BUSPAR Take 5 mg by mouth 3 (three) times daily as needed (anxiety).   cefdinir  300 MG capsule Commonly known as: OMNICEF  Take 1 capsule (300 mg total) by mouth 2 (two) times daily.   cyanocobalamin  1000 MCG/ML injection Commonly known as: VITAMIN B12 Inject 1,000 mcg into  the muscle every 30 (thirty) days.   cycloSPORINE  0.05 % ophthalmic emulsion Commonly known as: RESTASIS  Place 1 drop into both eyes 2 (two) times daily.   fluticasone  furoate-vilanterol 200-25 MCG/ACT Aepb Commonly known as: Breo Ellipta  Inhale 1 puff into the lungs daily.   furosemide  20 MG tablet Commonly known as: Lasix  Take 1 tablet (20 mg total) by mouth daily.   Imvexxy  Maintenance Pack 10 MCG Inst Generic drug: Estradiol  Place 1 suppository vaginally 2 (two) times a week. What changed: additional instructions   ipratropium-albuterol  0.5-2.5 (3) MG/3ML Soln Commonly known as: DUONEB Take 3 mLs by nebulization in the morning, at noon, and at bedtime. What changed: Another medication with the same name was changed. Make sure you understand how and when to take each.   ipratropium-albuterol  0.5-2.5 (3) MG/3ML Soln Commonly known as: DUONEB Take 3 mLs by nebulization every 4 (four) hours as needed (wheezing and SOB). What changed: reasons to take this   levothyroxine  50 MCG tablet Commonly known as: SYNTHROID  Take 50 mcg by mouth daily.   metFORMIN  1000 MG tablet Commonly known as: GLUCOPHAGE  Take 1,000 mg by mouth 2 (two) times daily.   midodrine  5 MG tablet Commonly known as: PROAMATINE  Take 5 mg by mouth 3 (three) times daily with meals.   nitrofurantoin  100 MG capsule Commonly known as: Macrodantin  Take 1 capsule (100 mg total) by mouth daily.   NONFORMULARY OR COMPOUNDED ITEM If patient is incontinent of urine and/or stool, SNF staff are instructed to cleanse patient's genital area at least once every 8 hours and change diaper if soiled.   NONFORMULARY OR COMPOUNDED ITEM SNF staff are instructed to encourage hydration with water  at least once every 2-4 hours while patient is awake.   NONFORMULARY OR COMPOUNDED ITEM SNF staff are instructed to prompt toileting and  assist patient with ambulation to toilet at least once every 4-6 hours while awake.    omeprazole 20 MG capsule Commonly known as: PRILOSEC Take 1 capsule (20 mg total) by mouth 2 (two) times daily before a meal.   polyethylene glycol 17 g packet Commonly known as: MIRALAX  / GLYCOLAX  Take 17 g by mouth daily as needed for mild constipation.   potassium chloride  10 MEQ tablet Commonly known as: KLOR-CON  Take 10 mEq by mouth daily.   predniSONE  5 MG tablet Commonly known as: DELTASONE  Take 1 tablet (5 mg total) by mouth daily with breakfast. Restart the day after prednisone  taper is done What changed: additional instructions   pregabalin  150 MG capsule Commonly known as: LYRICA  Take 1 capsule (150 mg total) by mouth 2 (two) times daily.   rizatriptan  10 MG disintegrating tablet Commonly known as: MAXALT -MLT Take 10 mg by mouth as needed for migraine.   Thera-M Tabs Take 1 tablet by mouth daily.   tiZANidine 2 MG tablet Commonly known as: ZANAFLEX Take 2 mg by mouth 2 (two) times daily as needed for muscle spasms.   traZODone  50 MG tablet Commonly known as: DESYREL  Take 1 tablet (50 mg total) by mouth at bedtime as needed for sleep.   Trintellix  10 MG Tabs tablet Generic drug: vortioxetine  HBr Take 10 mg by mouth daily.   umeclidinium bromide  62.5 MCG/ACT Aepb Commonly known as: INCRUSE ELLIPTA  Inhale 1 puff into the lungs daily.   venlafaxine  XR 150 MG 24 hr capsule Commonly known as: EFFEXOR -XR Take 150 mg by mouth every morning.         Relevant Imaging Results:  Relevant Lab Results:   Additional Information SSN: [id number] [place]

## 2024-11-12 NOTE — TOC Transition Note (Signed)
 Transition of Care [facility]) - Discharge Note   Patient Details  Name: [name] White MRN: [id number] Date of Birth: [birth year]  Transition of Care [facility]) CM/SW Contact:  Lucie Lunger, LCSWA Phone Number: 11/12/2024, 1:51 PM   Clinical Narrative:    CSW updated that pt is medically stable for D/C today. CSW sent D/C clinicals and Fl2 over to facility for review. CSW spoke to Gayle with [facility] who states they will provide transport. CSW spoke with Sharman about PT recommendations for [facility] PT, they will have in house PT work with pt. CSW updated pts daughter on plan for D/C. CSW provided update to RN that facility will provide transport. TOC signing off.   Final next level of care: Assisted Living Barriers to Discharge: Barriers Resolved   Patient Goals and CMS Choice Patient states their goals for this hospitalization and ongoing recovery are:: return to ALF CMS Medicare.gov Compare Post Acute Care list provided to:: Patient Represenative (must comment) Choice offered to / list presented to : Adult Children      Discharge Placement                Patient to be transferred to facility by: facility staff Name of family member notified: Daughter Olam Patient and family notified of of transfer: 11/12/24  Discharge Plan and Services Additional resources added to the After Visit Summary for       Post Acute Care Choice: Durable Medical Equipment                    HH Arranged: RN, PT       Representative spoke with at [facility] Agency: [facility] has own PT and RN services  Social Drivers of Health (SDOH) Interventions SDOH Screenings   Food Insecurity: No Food Insecurity (11/08/2024)  Housing: Low Risk  (11/09/2024)  Transportation Needs: No Transportation Needs (11/08/2024)  Utilities: Not At Risk (11/08/2024)  Depression (PHQ2-9): Low Risk  (10/28/2024)  Social Connections: Socially Isolated (11/08/2024)  Tobacco Use: Low Risk  (11/08/2024)     Readmission Risk  Interventions    11/09/2024    9:13 AM 10/12/2024   10:32 AM 05/23/2024   10:18 AM  Readmission Risk Prevention Plan  Transportation Screening Complete Complete Complete  HRI or Home Care Consult  Complete Complete  Social Work Consult for Recovery Care Planning/Counseling  Complete Complete  Palliative Care Screening  Not Applicable Not Applicable  Medication Review Ocean[employer]) Complete Complete Complete  HRI or Home Care Consult Complete    SW Recovery Care/Counseling Consult Complete    Palliative Care Screening Not Applicable    Skilled Nursing Facility Not Applicable

## 2024-11-12 NOTE — NC FL2 (Signed)
 [place]  MEDICAID FL2 LEVEL OF CARE FORM     IDENTIFICATION  Patient Name: [name] White Birthdate: [birth year] Sex: female Admission Date (Current Location): 11/08/2024  [facility] and [place] Number:  [employer] and Address:  [facility],  [address] [place] [address]      Provider Number: [id number]  Attending Physician Name and Address:  Evonnie Lenis, MD  Relative Name and Phone Number:       Current Level of Care: Hospital Recommended Level of Care: Assisted Living Facility Prior Approval Number:    Date Approved/Denied:   PASRR Number:    Discharge Plan: Other (Comment) (ALF)    Current Diagnoses: Patient Active Problem List   Diagnosis Date Noted   COPD with acute exacerbation (HCC) 11/10/2024   Lobar pneumonia 11/09/2024   Uncontrolled type 2 diabetes mellitus with hyperglycemia, with long-term current use of insulin  (HCC) 11/09/2024   CAP (community acquired pneumonia) 10/11/2024   Abnormal gait 07/10/2024   Cervical radiculopathy 07/10/2024   Chronic neck pain 07/10/2024   Visual disturbance 07/10/2024   Recurrent UTI 07/09/2024   Sepsis due to undetermined organism (HCC) 05/22/2024   Hypomagnesemia 05/01/2024   Acute respiratory failure with hypoxemia (HCC) 04/24/2024   CVA (cerebral vascular accident) (HCC) 03/12/2024   Anxiety and depression 03/12/2024   GERD without esophagitis 03/12/2024   Rhabdomyolysis 12/27/2023   Pressure injury of skin 12/27/2023   Acute metabolic encephalopathy 12/26/2023   Status post transsphenoidal pituitary resection 06/04/2020   Pituitary adenoma with extrasellar extension (HCC) 06/04/2020   Pituitary tumor 05/29/2020   Dizzinesses 02/02/2020   Vitamin D  deficiency 01/28/2020   Paroxysmal supraventricular tachycardia 06/19/2014   Rectocele 03/19/2013   Constipation 03/19/2013   HYPERCHOLESTEROLEMIA 02/09/2009   Essential hypertension 02/09/2009   Osteoporosis 02/09/2009   Post-menopausal  02/09/2009   HEAT INTOLERANCE 02/01/2008   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   Hypothyroidism 09/14/2007   Migraine headache 09/14/2007   SUPERFICIAL PHLEBITIS 09/14/2007   MENOPAUSAL SYNDROME 09/14/2007   FATIGUE 09/14/2007   Type 2 diabetes mellitus with peripheral neuropathy (HCC) 09/14/2007    Orientation RESPIRATION BLADDER Height & Weight     Self  Normal Incontinent Weight: 130 lb (59 kg) Height:  5' 7 (170.2 cm)  BEHAVIORAL SYMPTOMS/MOOD NEUROLOGICAL BOWEL NUTRITION STATUS      Continent Diet (Regular diet with no added table salt)  AMBULATORY STATUS COMMUNICATION OF NEEDS Skin   Limited Assist Verbally Normal                       Personal Care Assistance Level of Assistance  Bathing, Feeding, Dressing Bathing Assistance: Maximum assistance Feeding assistance: Limited assistance Dressing Assistance: Maximum assistance     Functional Limitations Info  Sight, Hearing, Speech Sight Info: Impaired Hearing Info: Adequate Speech Info: Adequate    SPECIAL CARE FACTORS FREQUENCY  PT (By licensed PT)     PT Frequency: PT 3 times weekly              Contractures Contractures Info: Not present    Additional Factors Info  Code Status, Allergies Code Status Info: DNR- Limited Allergies Info: Betadine, Povidone-Iodine, Penicillins           Current Medications (11/12/2024):  This is the current hospital active medication list Current Facility-Administered Medications  Medication Dose Route Frequency Provider Last Rate Last Admin   albuterol  (PROVENTIL ) (2.5 MG/3ML) 0.083% nebulizer solution 2.5 mg  2.5 mg Nebulization Q2H PRN Stinson, Jacob J, DO  albuterol  (PROVENTIL ) (2.5 MG/3ML) 0.083% nebulizer solution 2.5 mg  2.5 mg Nebulization TID Tat, Alm, MD   2.5 mg at 11/12/24 9188   arformoterol  (BROVANA ) nebulizer solution 15 mcg  15 mcg Nebulization BID Tat, Alm, MD   15 mcg at 11/12/24 0815   aspirin  EC tablet 81 mg  81 mg Oral Daily Stinson, Jacob  J, DO   81 mg at 11/12/24 9161   atorvastatin  (LIPITOR) tablet 80 mg  80 mg Oral Daily Stinson, Jacob J, DO   80 mg at 11/12/24 9161   azithromycin  (ZITHROMAX ) tablet 500 mg  500 mg Oral Daily Tat, David, MD       budesonide  (PULMICORT ) nebulizer solution 0.5 mg  0.5 mg Nebulization BID Tat, Alm, MD   0.5 mg at 11/12/24 0813   busPIRone (BUSPAR) tablet 5 mg  5 mg Oral TID PRN Stinson, Jacob J, DO   5 mg at 11/12/24 0015   cefTRIAXone  (ROCEPHIN ) 2 g in sodium chloride  0.9 % 100 mL IVPB  2 g Intravenous Q24H Stinson, Jacob J, DO 200 mL/hr at 11/11/24 1715 2 g at 11/11/24 1715   enoxaparin  (LOVENOX ) injection 40 mg  40 mg Subcutaneous QHS Stinson, Jacob J, DO   40 mg at 11/11/24 2043   feeding supplement (ENSURE PLUS HIGH PROTEIN) liquid 237 mL  237 mL Oral BID BM Evonnie Alm, MD   237 mL at 11/12/24 0840   guaiFENesin  (MUCINEX ) 12 hr tablet 600 mg  600 mg Oral BID Stinson, Jacob J, DO   600 mg at 11/12/24 9161   insulin  aspart (novoLOG ) injection 0-15 Units  0-15 Units Subcutaneous TID WC Stinson, Jacob J, DO   5 Units at 11/12/24 9161   insulin  aspart (novoLOG ) injection 0-5 Units  0-5 Units Subcutaneous QHS Stinson, Jacob J, DO   3 Units at 11/11/24 2043   insulin  aspart (novoLOG ) injection 5 Units  5 Units Subcutaneous TID WC Evonnie Alm, MD   5 Units at 11/12/24 9161   levothyroxine  (SYNTHROID ) tablet 50 mcg  50 mcg Oral Q0600 Stinson, Jacob J, DO   50 mcg at 11/12/24 9473   lidocaine  (LIDODERM ) 5 % 1 patch  1 patch Transdermal Q24H Evonnie Alm, MD   1 patch at 11/11/24 1447   midodrine  (PROAMATINE ) tablet 5 mg  5 mg Oral TID WC Evonnie Alm, MD   5 mg at 11/12/24 9161   ondansetron  (ZOFRAN ) tablet 4 mg  4 mg Oral Q6H PRN Stinson, Jacob J, DO       Or   ondansetron  (ZOFRAN ) injection 4 mg  4 mg Intravenous Q6H PRN Stinson, Jacob J, DO       Oral care mouth rinse  15 mL Mouth Rinse PRN Tat, Alm, MD       NOREEN ON 11/13/2024] predniSONE  (DELTASONE ) tablet 60 mg  60 mg Oral Q breakfast Tat, David,  MD       pregabalin  (LYRICA ) capsule 150 mg  150 mg Oral BID Stinson, Jacob J, DO   150 mg at 11/12/24 9161   revefenacin  (YUPELRI ) nebulizer solution 175 mcg  175 mcg Nebulization Daily Tat, Alm, MD   175 mcg at 11/12/24 9182   tiZANidine (ZANAFLEX) tablet 2 mg  2 mg Oral BID PRN Stinson, Jacob J, DO       traZODone  (DESYREL ) tablet 50 mg  50 mg Oral QHS PRN Stinson, Jacob J, DO   50 mg at 11/11/24 2043   venlafaxine  XR (EFFEXOR -XR) 24 hr capsule 150 mg  150 mg Oral Daily Stinson, Jacob J, DO   150 mg at 11/12/24 9161   vitamin B-12 (CYANOCOBALAMIN ) tablet 500 mcg  500 mcg Oral Daily Tat, Alm, MD   500 mcg at 11/12/24 9161   vortioxetine  HBr (TRINTELLIX ) tablet 10 mg  10 mg Oral Daily Tat, Alm, MD   10 mg at 11/12/24 9161     Discharge Medications: Allergies as of 11/12/2024       Reactions   Betadine [povidone Iodine] Hives   Povidone-iodine Hives   Penicillins Rash, Dermatitis        Medication List     TAKE these medications    acetaminophen  325 MG tablet Commonly known as: TYLENOL  Take 2 tablets (650 mg total) by mouth every 6 (six) hours as needed for mild pain (pain score 1-3) (or Fever >/= 101).   albuterol  108 (90 Base) MCG/ACT inhaler Commonly known as: VENTOLIN  HFA Inhale 2 puffs into the lungs every 4 (four) hours as needed for wheezing or shortness of breath.   aspirin  EC 81 MG tablet Take 81 mg by mouth daily. Swallow whole.   atorvastatin  40 MG tablet Commonly known as: LIPITOR Take 80 mg by mouth daily.   azithromycin  500 MG tablet Commonly known as: ZITHROMAX  Take 1 tablet (500 mg total) by mouth daily.   busPIRone 5 MG tablet Commonly known as: BUSPAR Take 5 mg by mouth 3 (three) times daily as needed (anxiety).   cefdinir  300 MG capsule Commonly known as: OMNICEF  Take 1 capsule (300 mg total) by mouth 2 (two) times daily.   cyanocobalamin  1000 MCG/ML injection Commonly known as: VITAMIN B12 Inject 1,000 mcg into the muscle every 30  (thirty) days.   cycloSPORINE  0.05 % ophthalmic emulsion Commonly known as: RESTASIS  Place 1 drop into both eyes 2 (two) times daily.   fluticasone  furoate-vilanterol 200-25 MCG/ACT Aepb Commonly known as: Breo Ellipta  Inhale 1 puff into the lungs daily.   furosemide  20 MG tablet Commonly known as: Lasix  Take 1 tablet (20 mg total) by mouth daily as needed for edema or fluid. What changed: when to take this   Imvexxy  Maintenance Pack 10 MCG Inst Generic drug: Estradiol  Place 1 suppository vaginally 2 (two) times a week. What changed: additional instructions   ipratropium-albuterol  0.5-2.5 (3) MG/3ML Soln Commonly known as: DUONEB Take 3 mLs by nebulization in the morning, at noon, and at bedtime. What changed: Another medication with the same name was changed. Make sure you understand how and when to take each.   ipratropium-albuterol  0.5-2.5 (3) MG/3ML Soln Commonly known as: DUONEB Take 3 mLs by nebulization every 4 (four) hours as needed (wheezing and SOB). What changed: reasons to take this   levothyroxine  50 MCG tablet Commonly known as: SYNTHROID  Take 50 mcg by mouth daily.   metFORMIN  1000 MG tablet Commonly known as: GLUCOPHAGE  Take 1,000 mg by mouth 2 (two) times daily.   midodrine  5 MG tablet Commonly known as: PROAMATINE  Take 5 mg by mouth 3 (three) times daily with meals.   nitrofurantoin  100 MG capsule Commonly known as: Macrodantin  Take 1 capsule (100 mg total) by mouth daily.   NONFORMULARY OR COMPOUNDED ITEM If patient is incontinent of urine and/or stool, SNF staff are instructed to cleanse patient's genital area at least once every 8 hours and change diaper if soiled.   NONFORMULARY OR COMPOUNDED ITEM SNF staff are instructed to encourage hydration with water  at least once every 2-4 hours while patient is awake.   NONFORMULARY OR COMPOUNDED  ITEM SNF staff are instructed to prompt toileting and assist patient with ambulation to toilet at least  once every 4-6 hours while awake.   omeprazole 20 MG capsule Commonly known as: PRILOSEC Take 1 capsule (20 mg total) by mouth 2 (two) times daily before a meal.   polyethylene glycol 17 g packet Commonly known as: MIRALAX  / GLYCOLAX  Take 17 g by mouth daily as needed for mild constipation.   potassium chloride  10 MEQ tablet Commonly known as: KLOR-CON  Take 10 mEq by mouth daily.   predniSONE  5 MG tablet Commonly known as: DELTASONE  Take 1 tablet (5 mg total) by mouth daily with breakfast. Restart the day after prednisone  taper is done What changed: additional instructions   pregabalin  150 MG capsule Commonly known as: LYRICA  Take 1 capsule (150 mg total) by mouth 2 (two) times daily.   rizatriptan  10 MG disintegrating tablet Commonly known as: MAXALT -MLT Take 10 mg by mouth as needed for migraine.   Thera-M Tabs Take 1 tablet by mouth daily.   tiZANidine 2 MG tablet Commonly known as: ZANAFLEX Take 2 mg by mouth 2 (two) times daily as needed for muscle spasms.   traZODone  50 MG tablet Commonly known as: DESYREL  Take 1 tablet (50 mg total) by mouth at bedtime as needed for sleep.   Trintellix  10 MG Tabs tablet Generic drug: vortioxetine  HBr Take 10 mg by mouth daily.   umeclidinium bromide  62.5 MCG/ACT Aepb Commonly known as: INCRUSE ELLIPTA  Inhale 1 puff into the lungs daily.   venlafaxine  XR 150 MG 24 hr capsule Commonly known as: EFFEXOR -XR Take 150 mg by mouth every morning.         Relevant Imaging Results:  Relevant Lab Results:   Additional Information SSN: [id number], LCSWA

## 2024-11-12 NOTE — Evaluation (Signed)
 Physical Therapy Evaluation Patient Details Name: [name] White MRN: [id number] DOB: 1941/11/18 Today's Date: 11/12/2024  History of Present Illness  [name] White is a 83 y.o. female with medical history significant of diabetes, hypertension, hypothyroidism, dementia, malnutrition, history of CVA, asthma.  [facility].  She was admitted in the middle of October with pneumonia and reactive airway disease.  She was discharged back to the assisted living facilities on long-acting inhalers and has been doing fairly well, except she never stops coughing.  Recently, her coughing became worse.  No fevers here, but may have had a fever at the assisted living facility.  She was seen today at her PCPs office, Dr. [name], who sent her here here for evaluation.   Clinical Impression  Patient demonstrates slow labored movement for sitting up at bedside with difficulty scooting to EOB, very unsteady on feet and limited to a few steps forward/backwards at bedside before having to sit due to fatigue/poor standing balance using RW. Patient tolerated sitting up in chair after therapy. PLAN:  Patient to be discharged home today and discharged from acute physical therapy to care of nursing for out of bed as tolerated for length of stay with recommendations stated below           If plan is discharge home, recommend the following: A lot of help with bathing/dressing/bathroom;A lot of help with walking and/or transfers;Help with stairs or ramp for entrance;Assist for transportation;Assistance with cooking/housework   Can travel by private vehicle        Equipment Recommendations None recommended by PT  Recommendations for Other Services       Functional Status Assessment Patient has had a recent decline in their functional status and/or demonstrates limited ability to make significant improvements in function in a reasonable and predictable amount of time      Precautions / Restrictions Precautions Precautions: Fall Recall of Precautions/Restrictions: Impaired Restrictions Weight Bearing Restrictions Per Provider Order: No      Mobility  Bed Mobility Overal bed mobility: Needs Assistance Bed Mobility: Supine to Sit     Supine to sit: Mod assist     General bed mobility comments: slow labored movement with HOB flat, had most difficulty scooting to EOB    Transfers Overall transfer level: Needs assistance Equipment used: Rolling walker (2 wheels) Transfers: Sit to/from Stand, Bed to chair/wheelchair/BSC Sit to Stand: Min assist, Mod assist   Step pivot transfers: Min assist       General transfer comment: unsteady labored movement using RW    Ambulation/Gait Ambulation/Gait assistance: Mod assist Gait Distance (Feet): 8 Feet Assistive device: Rolling walker (2 wheels) Gait Pattern/deviations: Decreased step length - left, Decreased stance time - right, Decreased stride length, Trunk flexed Gait velocity: slow     General Gait Details: limited to a few side steps and steps forward/backwards before having to sit due to fatigue, poor standing balance  Stairs            Wheelchair Mobility     Tilt Bed    Modified Rankin (Stroke Patients Only)       Balance Overall balance assessment: Needs assistance Sitting-balance support: Feet supported, No upper extremity supported Sitting balance-Leahy Scale: Fair Sitting balance - Comments: seated at EOB   Standing balance support: Reliant on assistive device for balance, During functional activity, Bilateral upper extremity supported Standing balance-Leahy Scale: Poor Standing balance comment: using RW  Pertinent Vitals/Pain Pain Assessment Pain Assessment: No/denies pain    Home Living Family/patient expects to be discharged to:: Assisted living                 Home Equipment: Agricultural Consultant (2 wheels);Rollator  (4 wheels);Cane - quad;Cane - single point;Hand held shower head;Shower seat - built in;Grab bars - tub/shower      Prior Function Prior Level of Function : Needs assist         Mobility (physical): Bed mobility;Transfers;Gait   Mobility Comments: One person assisted transfers to w/c, uses w/c for mobility, ambulates with assistance with HHPT ADLs Comments: Assisted by ALF staff     Extremity/Trunk Assessment   Upper Extremity Assessment Upper Extremity Assessment: Defer to OT evaluation    Lower Extremity Assessment Lower Extremity Assessment: Generalized weakness    Cervical / Trunk Assessment Cervical / Trunk Assessment: Kyphotic  Communication   Communication Communication: No apparent difficulties    Cognition Arousal: Alert Behavior During Therapy: WFL for tasks assessed/performed   PT - Cognitive impairments: No apparent impairments                         Following commands: Intact       Cueing Cueing Techniques: Tactile cues, Verbal cues     General Comments      Exercises     Assessment/Plan    PT Assessment All further PT needs can be met in the next venue of care  PT Problem List Decreased strength;Decreased activity tolerance;Decreased balance;Decreased mobility       PT Treatment Interventions      PT Goals (Current goals can be found in the Care Plan section)  Acute Rehab PT Goals Patient Stated Goal: return home with ALF staff to assist PT Goal Formulation: With patient Time For Goal Achievement: 11/12/24 Potential to Achieve Goals: Good    Frequency       Co-evaluation               AM-PAC PT 6 Clicks Mobility  Outcome Measure Help needed turning from your back to your side while in a flat bed without using bedrails?: A Little Help needed moving from lying on your back to sitting on the side of a flat bed without using bedrails?: A Lot Help needed moving to and from a bed to a chair (including a  wheelchair)?: A Lot Help needed standing up from a chair using your arms (e.g., wheelchair or bedside chair)?: A Lot Help needed to walk in hospital room?: A Lot Help needed climbing 3-5 steps with a railing? : Total 6 Click Score: 12    End of Session   Activity Tolerance: Patient tolerated treatment well;Patient limited by fatigue Patient left: in chair;with call bell/phone within reach;with chair alarm set Nurse Communication: Mobility status PT Visit Diagnosis: Unsteadiness on feet (R26.81);Other abnormalities of gait and mobility (R26.89);Muscle weakness (generalized) (M62.81)    Time: 8958-8897 PT Time Calculation (min) (ACUTE ONLY): 21 min   Charges:   PT Evaluation $PT Eval Moderate Complexity: 1 Mod PT Treatments $Therapeutic Activity: 8-22 mins PT General Charges $$ ACUTE PT VISIT: 1 Visit         12:25 PM, 11/12/24 Lynwood Music, MPT Physical Therapist with [facility] [phone] office [phone] mobile phone

## 2024-11-13 LAB — CULTURE, BLOOD (ROUTINE X 2)
Culture: NO GROWTH
Culture: NO GROWTH
Special Requests: ADEQUATE
Special Requests: ADEQUATE

## 2024-11-27 ENCOUNTER — Ambulatory Visit: Admitting: Urology

## 2024-11-27 ENCOUNTER — Encounter: Payer: Self-pay | Admitting: Urology

## 2024-11-27 VITALS — BP 120/73 | HR 92

## 2024-11-27 DIAGNOSIS — N3281 Overactive bladder: Secondary | ICD-10-CM | POA: Diagnosis not present

## 2024-11-27 DIAGNOSIS — Z87440 Personal history of urinary (tract) infections: Secondary | ICD-10-CM

## 2024-11-27 DIAGNOSIS — N390 Urinary tract infection, site not specified: Secondary | ICD-10-CM

## 2024-11-27 MED ORDER — NITROFURANTOIN MACROCRYSTAL 50 MG PO CAPS: 50.0000 mg | ORAL_CAPSULE | Freq: Every day | ORAL | 3 refills | Status: DC

## 2024-11-27 NOTE — Progress Notes (Signed)
 11/27/2024 9:34 AM   [name] May 04, 1941 [id number]  Referring provider: [name], MD [address] [place],  [place] [address]  Followup frequent UTI   HPI: Ms Criger is a 83yo here for followup for OAb and recurrent UTIs. No UTIs since last visit. She is on macrobid  100mg  for prophylaxis. She denies any worsening urinary urgency or frequency. She uses 1-2 pads per day. No straining to urinate. No dysuria or gross hematuria   PMH: Past Medical History:  Diagnosis Date   Anemia    Anxiety    Asthma    ASYMPTOMATIC POSTMENOPAUSAL STATUS 02/09/2009   Cataract    Cerebral infarction [facility])    Constipation 03/19/2013   Depression    DIABETES MELLITUS, TYPE II 09/14/2007   HYPERCHOLESTEROLEMIA 02/09/2009   HYPERTENSION 02/09/2009   HYPOTHYROIDISM 09/14/2007   Metabolic encephalopathy    MIGRAINE HEADACHE 09/14/2007   Neuropathy    OSTEOPOROSIS 02/09/2009   PANCREATITIS 09/14/2007   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   PONV (postoperative nausea and vomiting)    Protein calorie malnutrition    Rectocele 03/19/2013   Shortness of breath    SUPERFICIAL PHLEBITIS 09/14/2007   Varicose veins     Surgical History: Past Surgical History:  Procedure Laterality Date   ABDOMINAL HYSTERECTOMY     ANTERIOR AND POSTERIOR REPAIR N/A 04/02/2013   Procedure: ANTERIOR (CYSTOCELE) AND POSTERIOR REPAIR (RECTOCELE);  Surgeon: Norleen LULLA Server, MD;  Location: [facility];  Service: Gynecology;  Laterality: N/A;   APPENDECTOMY     BRAIN SURGERY     CATARACT EXTRACTION W/PHACO  12/05/2011   Procedure: CATARACT EXTRACTION PHACO AND INTRAOCULAR LENS PLACEMENT (IOC);  Surgeon: Cherene Mania;  Location: [facility];  Service: Ophthalmology;  Laterality: Left;  CDE:9.65   CHOLECYSTECTOMY     CRANIOTOMY N/A 06/04/2020   Procedure: Endoscopic Transphenoidal Resection of Recurrent PituitaryTumor;  Surgeon: Colon Shove, MD;  Location: [facility] OR;  Service: Neurosurgery;  Laterality: N/A;   CYSTOSCOPY      ENDOVENOUS ABLATION SAPHENOUS VEIN W/ LASER  11-03-2011   right greater saphenous vein   left leg done 10-2011   EYE SURGERY  98   right cataract extraction 98   LAPAROSCOPIC NISSEN FUNDOPLICATION     NM ESOPHAGEAL REFLUX  08-11-11   PITUITARY EXCISION  10/1997   POSTERIOR REPAIR     TRANSNASAL APPROACH N/A 06/04/2020   Procedure: TRANSNASAL APPROACH;  Surgeon: Mable Lenis, MD;  Location[facility];  Service: ENT;  Laterality: N/A;   TRANSPHENOIDAL / TRANSNASAL HYPOPHYSECTOMY / RESECTION PITUITARY TUMOR  08-11-11    Home Medications:  Allergies as of 11/27/2024       Reactions   Betadine [povidone Iodine] Hives   Povidone-iodine Hives   Penicillins Rash, Dermatitis        Medication List        Accurate as of November 27, 2024  9:34 AM. If you have any questions, ask your nurse or doctor.          acetaminophen  325 MG tablet Commonly known as: TYLENOL  Take 2 tablets (650 mg total) by mouth every 6 (six) hours as needed for mild pain (pain score 1-3) (or Fever >/= 101).   albuterol  108 (90 Base) MCG/ACT inhaler Commonly known as: VENTOLIN  HFA Inhale 2 puffs into the lungs every 4 (four) hours as needed for wheezing or shortness of breath.   aspirin  EC 81 MG tablet Take 81 mg by mouth daily. Swallow whole.   atorvastatin  40 MG  tablet Commonly known as: LIPITOR Take 80 mg by mouth daily.   azithromycin  500 MG tablet Commonly known as: ZITHROMAX  Take 1 tablet (500 mg total) by mouth daily.   busPIRone  5 MG tablet Commonly known as: BUSPAR  Take 5 mg by mouth 3 (three) times daily as needed (anxiety).   cefdinir  300 MG capsule Commonly known as: OMNICEF  Take 1 capsule (300 mg total) by mouth 2 (two) times daily.   cyanocobalamin  1000 MCG/ML injection Commonly known as: VITAMIN B12 Inject 1,000 mcg into the muscle every 30 (thirty) days.   cycloSPORINE  0.05 % ophthalmic emulsion Commonly known as: RESTASIS  Place 1 drop into both eyes 2 (two) times daily.    fluticasone  furoate-vilanterol 200-25 MCG/ACT Aepb Commonly known as: Breo Ellipta  Inhale 1 puff into the lungs daily.   furosemide  20 MG tablet Commonly known as: Lasix  Take 1 tablet (20 mg total) by mouth daily.   Imvexxy  Maintenance Pack 10 MCG Inst Generic drug: Estradiol  Place 1 suppository vaginally 2 (two) times a week. What changed: additional instructions   ipratropium-albuterol  0.5-2.5 (3) MG/3ML Soln Commonly known as: DUONEB Take 3 mLs by nebulization in the morning, at noon, and at bedtime. What changed: Another medication with the same name was changed. Make sure you understand how and when to take each.   ipratropium-albuterol  0.5-2.5 (3) MG/3ML Soln Commonly known as: DUONEB Take 3 mLs by nebulization every 4 (four) hours as needed (wheezing and SOB). What changed: reasons to take this   levothyroxine  50 MCG tablet Commonly known as: SYNTHROID  Take 50 mcg by mouth daily.   metFORMIN  1000 MG tablet Commonly known as: GLUCOPHAGE  Take 1,000 mg by mouth 2 (two) times daily.   midodrine  5 MG tablet Commonly known as: PROAMATINE  Take 5 mg by mouth 3 (three) times daily with meals.   nitrofurantoin  100 MG capsule Commonly known as: Macrodantin  Take 1 capsule (100 mg total) by mouth daily.   NONFORMULARY OR COMPOUNDED ITEM If patient is incontinent of urine and/or stool, SNF staff are instructed to cleanse patient's genital area at least once every 8 hours and change diaper if soiled.   NONFORMULARY OR COMPOUNDED ITEM SNF staff are instructed to encourage hydration with water  at least once every 2-4 hours while patient is awake.   NONFORMULARY OR COMPOUNDED ITEM SNF staff are instructed to prompt toileting and assist patient with ambulation to toilet at least once every 4-6 hours while awake.   omeprazole  20 MG capsule Commonly known as: PRILOSEC Take 1 capsule (20 mg total) by mouth 2 (two) times daily before a meal.   polyethylene glycol 17 g  packet Commonly known as: MIRALAX  / GLYCOLAX  Take 17 g by mouth daily as needed for mild constipation.   potassium chloride  10 MEQ tablet Commonly known as: KLOR-CON  Take 10 mEq by mouth daily.   predniSONE  5 MG tablet Commonly known as: DELTASONE  Take 1 tablet (5 mg total) by mouth daily with breakfast. Restart the day after prednisone  taper is done   pregabalin  150 MG capsule Commonly known as: LYRICA  Take 1 capsule (150 mg total) by mouth 2 (two) times daily.   rizatriptan  10 MG disintegrating tablet Commonly known as: MAXALT -MLT Take 10 mg by mouth as needed for migraine.   Thera-M Tabs Take 1 tablet by mouth daily.   tiZANidine  2 MG tablet Commonly known as: ZANAFLEX  Take 2 mg by mouth 2 (two) times daily as needed for muscle spasms.   traZODone  50 MG tablet Commonly known as: DESYREL  Take 1  tablet (50 mg total) by mouth at bedtime as needed for sleep.   Trintellix  10 MG Tabs tablet Generic drug: vortioxetine  HBr Take 10 mg by mouth daily.   umeclidinium bromide  62.5 MCG/ACT Aepb Commonly known as: INCRUSE ELLIPTA  Inhale 1 puff into the lungs daily.   venlafaxine  XR 150 MG 24 hr capsule Commonly known as: EFFEXOR -XR Take 150 mg by mouth every morning.        Allergies:  Allergies  Allergen Reactions   Betadine [Povidone Iodine] Hives   Povidone-Iodine Hives   Penicillins Rash and Dermatitis    Family History: Family History  Problem Relation Age of Onset   Heart disease Mother    Asthma Mother    COPD Father    Heart disease Father    Stroke Father    Asthma Daughter    Migraines Daughter    Neuropathy Son    Cancer Neg Hx    Anesthesia problems Neg Hx    Hypotension Neg Hx    Malignant hyperthermia Neg Hx    Pseudochol deficiency Neg Hx     Social History:  reports that she has never smoked. She has never used smokeless tobacco. She reports that she does not drink alcohol and does not use drugs.  ROS: All other review of systems were  reviewed and are negative except what is noted above in HPI  Physical Exam: BP 120/73   Pulse 92   Constitutional:  Alert and oriented, No acute distress. HEENT: [place] AT, moist mucus membranes.  Trachea midline, no masses. Cardiovascular: No clubbing, cyanosis, or edema. Respiratory: Normal respiratory effort, no increased work of breathing. GI: Abdomen is soft, nontender, nondistended, no abdominal masses GU: No CVA tenderness.  Lymph: No cervical or inguinal lymphadenopathy. Skin: No rashes, bruises or suspicious lesions. Neurologic: Grossly intact, no focal deficits, moving all 4 extremities. Psychiatric: Normal mood and affect.  Laboratory Data: Lab Results  Component Value Date   WBC 12.1 (H) 11/10/2024   HGB 9.3 (L) 11/10/2024   HCT 28.3 (L) 11/10/2024   MCV 88.7 11/10/2024   PLT 210 11/10/2024    Lab Results  Component Value Date   CREATININE 0.67 11/11/2024    No results found for: PSA  No results found for: TESTOSTERONE  Lab Results  Component Value Date   HGBA1C 6.5 (H) 11/09/2024    Urinalysis    Component Value Date/Time   COLORURINE YELLOW 11/11/2024 1124   APPEARANCEUR CLEAR 11/11/2024 1124   APPEARANCEUR Cloudy (A) 10/02/2024 1005   LABSPEC 1.015 11/11/2024 1124   PHURINE 5.0 11/11/2024 1124   GLUCOSEU >=500 (A) 11/11/2024 1124   GLUCOSEU >=1000 (A) 06/25/2020 1441   HGBUR NEGATIVE 11/11/2024 1124   BILIRUBINUR NEGATIVE 11/11/2024 1124   BILIRUBINUR Negative 10/02/2024 1005   KETONESUR NEGATIVE 11/11/2024 1124   PROTEINUR NEGATIVE 11/11/2024 1124   UROBILINOGEN 0.2 06/25/2020 1441   NITRITE NEGATIVE 11/11/2024 1124   LEUKOCYTESUR NEGATIVE 11/11/2024 1124    Lab Results  Component Value Date   LABMICR See below: 10/02/2024   WBCUA >30 (A) 10/02/2024   LABEPIT 0-10 10/02/2024   MUCUS 1+ 02/09/2009   BACTERIA NONE SEEN 11/11/2024    Pertinent Imaging:  No results found for this or any previous visit.  Results for orders placed  during the hospital encounter of 06/26/15  US  Venous Img Lower Bilateral  Narrative CLINICAL DATA:  Bilateral lower extremity cellulitis. Pain and swelling x2 weeks.  EXAM: BILATERAL LOWER EXTREMITY VENOUS DOPPLER ULTRASOUND  TECHNIQUE: Gray-scale sonography  with compression, as well as color and duplex ultrasound, were performed to evaluate the deep venous system from the level of the common femoral vein through the popliteal and proximal calf veins.  COMPARISON:  06/30/2008  FINDINGS: Normal compressibility of the common femoral, superficial femoral, and popliteal veins, as well as the proximal calf veins. No filling defects to suggest DVT on grayscale or color Doppler imaging. Doppler waveforms show normal direction of venous flow, normal respiratory phasicity and response to augmentation. No evidence of Baker cyst. Visualized segments of the saphenous venous system normal in caliber and compressibility.  IMPRESSION: 1. No evidence of lower extremity deep vein thrombosis, BILATERALLY.   Electronically Signed By: JONETTA Faes M.D. On: 06/26/2015 16:06  No results found for this or any previous visit.  No results found for this or any previous visit.  No results found for this or any previous visit.  No results found for this or any previous visit.  No results found for this or any previous visit.  No results found for this or any previous visit.   Assessment & Plan:    1. Recurrent UTI (Primary) -decrease prophylaxis to 50mg  daily - Urinalysis, Routine w reflex microscopic  2. OAB (overactive bladder) -patient defers therapy at this time   No follow-ups on file.  Belvie Clara, MD  [facility] [place]

## 2024-11-27 NOTE — Patient Instructions (Signed)

## 2024-12-10 ENCOUNTER — Encounter (HOSPITAL_COMMUNITY): Payer: Self-pay

## 2024-12-10 ENCOUNTER — Emergency Department (HOSPITAL_COMMUNITY)

## 2024-12-10 ENCOUNTER — Inpatient Hospital Stay (HOSPITAL_COMMUNITY)
Admission: EM | Admit: 2024-12-10 | Discharge: 2024-12-16 | DRG: 640 | Disposition: A | Discharge status: Skilled Nursing Facility | Source: Skilled Nursing Facility | Attending: Internal Medicine | Admitting: Internal Medicine

## 2024-12-10 ENCOUNTER — Other Ambulatory Visit: Payer: Self-pay

## 2024-12-10 DIAGNOSIS — Z79890 Hormone replacement therapy (postmenopausal): Secondary | ICD-10-CM

## 2024-12-10 DIAGNOSIS — E1140 Type 2 diabetes mellitus with diabetic neuropathy, unspecified: Secondary | ICD-10-CM | POA: Diagnosis present

## 2024-12-10 DIAGNOSIS — D72829 Elevated white blood cell count, unspecified: Secondary | ICD-10-CM | POA: Diagnosis present

## 2024-12-10 DIAGNOSIS — Z993 Dependence on wheelchair: Secondary | ICD-10-CM

## 2024-12-10 DIAGNOSIS — K219 Gastro-esophageal reflux disease without esophagitis: Secondary | ICD-10-CM | POA: Diagnosis present

## 2024-12-10 DIAGNOSIS — E7800 Pure hypercholesterolemia, unspecified: Secondary | ICD-10-CM | POA: Diagnosis present

## 2024-12-10 DIAGNOSIS — E039 Hypothyroidism, unspecified: Secondary | ICD-10-CM | POA: Diagnosis present

## 2024-12-10 DIAGNOSIS — I10 Essential (primary) hypertension: Secondary | ICD-10-CM | POA: Diagnosis present

## 2024-12-10 DIAGNOSIS — Z87440 Personal history of urinary (tract) infections: Secondary | ICD-10-CM

## 2024-12-10 DIAGNOSIS — R400 Somnolence: Principal | ICD-10-CM

## 2024-12-10 DIAGNOSIS — Z8249 Family history of ischemic heart disease and other diseases of the circulatory system: Secondary | ICD-10-CM

## 2024-12-10 DIAGNOSIS — Z888 Allergy status to other drugs, medicaments and biological substances status: Secondary | ICD-10-CM

## 2024-12-10 DIAGNOSIS — G9341 Metabolic encephalopathy: Secondary | ICD-10-CM | POA: Diagnosis present

## 2024-12-10 DIAGNOSIS — R54 Age-related physical debility: Secondary | ICD-10-CM | POA: Diagnosis present

## 2024-12-10 DIAGNOSIS — Z7951 Long term (current) use of inhaled steroids: Secondary | ICD-10-CM

## 2024-12-10 DIAGNOSIS — Z7984 Long term (current) use of oral hypoglycemic drugs: Secondary | ICD-10-CM

## 2024-12-10 DIAGNOSIS — F0150 Vascular dementia without behavioral disturbance: Secondary | ICD-10-CM

## 2024-12-10 DIAGNOSIS — Z823 Family history of stroke: Secondary | ICD-10-CM

## 2024-12-10 DIAGNOSIS — Z8673 Personal history of transient ischemic attack (TIA), and cerebral infarction without residual deficits: Secondary | ICD-10-CM

## 2024-12-10 DIAGNOSIS — J45909 Unspecified asthma, uncomplicated: Secondary | ICD-10-CM | POA: Diagnosis present

## 2024-12-10 DIAGNOSIS — F0153 Vascular dementia, unspecified severity, with mood disturbance: Secondary | ICD-10-CM | POA: Diagnosis present

## 2024-12-10 DIAGNOSIS — Z825 Family history of asthma and other chronic lower respiratory diseases: Secondary | ICD-10-CM

## 2024-12-10 DIAGNOSIS — Z7982 Long term (current) use of aspirin: Secondary | ICD-10-CM

## 2024-12-10 DIAGNOSIS — Z90710 Acquired absence of both cervix and uterus: Secondary | ICD-10-CM

## 2024-12-10 DIAGNOSIS — R197 Diarrhea, unspecified: Secondary | ICD-10-CM | POA: Diagnosis not present

## 2024-12-10 DIAGNOSIS — Z66 Do not resuscitate: Secondary | ICD-10-CM | POA: Diagnosis present

## 2024-12-10 DIAGNOSIS — Z8672 Personal history of thrombophlebitis: Secondary | ICD-10-CM

## 2024-12-10 DIAGNOSIS — Z792 Long term (current) use of antibiotics: Secondary | ICD-10-CM

## 2024-12-10 DIAGNOSIS — Z79899 Other long term (current) drug therapy: Secondary | ICD-10-CM

## 2024-12-10 DIAGNOSIS — E860 Dehydration: Principal | ICD-10-CM | POA: Diagnosis present

## 2024-12-10 DIAGNOSIS — R109 Unspecified abdominal pain: Secondary | ICD-10-CM | POA: Diagnosis not present

## 2024-12-10 DIAGNOSIS — M810 Age-related osteoporosis without current pathological fracture: Secondary | ICD-10-CM | POA: Diagnosis present

## 2024-12-10 DIAGNOSIS — M6281 Muscle weakness (generalized): Secondary | ICD-10-CM | POA: Diagnosis present

## 2024-12-10 DIAGNOSIS — E869 Volume depletion, unspecified: Secondary | ICD-10-CM

## 2024-12-10 DIAGNOSIS — Z880 Allergy status to penicillin: Secondary | ICD-10-CM

## 2024-12-10 DIAGNOSIS — F32A Depression, unspecified: Secondary | ICD-10-CM | POA: Diagnosis present

## 2024-12-10 DIAGNOSIS — F419 Anxiety disorder, unspecified: Secondary | ICD-10-CM | POA: Diagnosis present

## 2024-12-10 DIAGNOSIS — D638 Anemia in other chronic diseases classified elsewhere: Secondary | ICD-10-CM | POA: Diagnosis present

## 2024-12-10 LAB — URINALYSIS, ROUTINE W REFLEX MICROSCOPIC
Bilirubin Urine: NEGATIVE
Glucose, UA: NEGATIVE mg/dL
Hgb urine dipstick: NEGATIVE
Ketones, ur: NEGATIVE mg/dL
Leukocytes,Ua: NEGATIVE
Nitrite: NEGATIVE
Protein, ur: NEGATIVE mg/dL
Specific Gravity, Urine: 1.016 (ref 1.005–1.030)
pH: 6 (ref 5.0–8.0)

## 2024-12-10 LAB — COMPREHENSIVE METABOLIC PANEL WITH GFR
ALT: 25 U/L (ref 0–44)
AST: 28 U/L (ref 15–41)
Albumin: 3.9 g/dL (ref 3.5–5.0)
Alkaline Phosphatase: 42 U/L (ref 38–126)
Anion gap: 14 (ref 5–15)
BUN: 15 mg/dL (ref 8–23)
CO2: 23 mmol/L (ref 22–32)
Calcium: 8.5 mg/dL — ABNORMAL LOW (ref 8.9–10.3)
Chloride: 98 mmol/L (ref 98–111)
Creatinine, Ser: 0.59 mg/dL (ref 0.44–1.00)
GFR, Estimated: >60 mL/min (ref >60)
Glucose, Bld: 176 mg/dL — ABNORMAL HIGH (ref 70–99)
Potassium: 4.7 mmol/L (ref 3.5–5.1)
Sodium: 135 mmol/L (ref 135–145)
Total Bilirubin: 1 mg/dL (ref 0.0–1.2)
Total Protein: 6.4 g/dL — ABNORMAL LOW (ref 6.5–8.1)

## 2024-12-10 LAB — CBC WITH DIFFERENTIAL/PLATELET
Abs Immature Granulocytes: 0.11 K/uL — ABNORMAL HIGH (ref 0.00–0.07)
Basophils Absolute: 0.1 K/uL (ref 0.0–0.1)
Basophils Relative: 1 %
Eosinophils Absolute: 0.2 K/uL (ref 0.0–0.5)
Eosinophils Relative: 2 %
HCT: 34.2 % — ABNORMAL LOW (ref 36.0–46.0)
Hemoglobin: 11.2 g/dL — ABNORMAL LOW (ref 12.0–15.0)
Immature Granulocytes: 1 %
Lymphocytes Relative: 15 %
Lymphs Abs: 2.1 K/uL (ref 0.7–4.0)
MCH: 29.2 pg (ref 26.0–34.0)
MCHC: 32.7 g/dL (ref 30.0–36.0)
MCV: 89.1 fL (ref 80.0–100.0)
Monocytes Absolute: 1.5 K/uL — ABNORMAL HIGH (ref 0.1–1.0)
Monocytes Relative: 11 %
Neutro Abs: 9.9 K/uL — ABNORMAL HIGH (ref 1.7–7.7)
Neutrophils Relative %: 70 %
Platelets: 245 K/uL (ref 150–400)
RBC: 3.84 MIL/uL — ABNORMAL LOW (ref 3.87–5.11)
RDW: 17.7 % — ABNORMAL HIGH (ref 11.5–15.5)
WBC: 13.9 K/uL — ABNORMAL HIGH (ref 4.0–10.5)
nRBC: 0 % (ref 0.0–0.2)

## 2024-12-10 LAB — LACTIC ACID, PLASMA
Lactic Acid, Venous: 2 mmol/L (ref 0.5–1.9)
Lactic Acid, Venous: 1.8 mmol/L (ref 0.5–1.9)

## 2024-12-10 LAB — CBG MONITORING, ED: Glucose-Capillary: 180 mg/dL — ABNORMAL HIGH (ref 70–99)

## 2024-12-10 MED ORDER — ALBUTEROL SULFATE (2.5 MG/3ML) 0.083% IN NEBU: 2.5000 mg | INHALATION_SOLUTION | RESPIRATORY_TRACT | Status: DC | PRN

## 2024-12-10 MED ORDER — PANTOPRAZOLE SODIUM 40 MG PO TBEC
40.0000 mg | DELAYED_RELEASE_TABLET | Freq: Every day | ORAL | Status: DC
  Administered 2024-12-11 – 2024-12-16 (×6): 40 mg via ORAL
  Filled 2024-12-10 (×6): qty 1

## 2024-12-10 MED ORDER — SODIUM CHLORIDE 0.9 % IV BOLUS
500.0000 mL | Freq: Once | INTRAVENOUS | Status: AC
  Administered 2024-12-10: 14:00:00 500 mL via INTRAVENOUS

## 2024-12-10 MED ORDER — SODIUM CHLORIDE 0.9 % IV BOLUS: 1000.0000 mL | Freq: Once | INTRAVENOUS | Status: DC

## 2024-12-10 MED ORDER — ENOXAPARIN SODIUM 40 MG/0.4ML IJ SOSY
40.0000 mg | PREFILLED_SYRINGE | INTRAMUSCULAR | Status: DC
  Administered 2024-12-11 – 2024-12-16 (×6): 40 mg via SUBCUTANEOUS
  Filled 2024-12-10 (×6): qty 0.4

## 2024-12-10 MED ORDER — LEVOTHYROXINE SODIUM 100 MCG PO TABS
50.0000 ug | ORAL_TABLET | Freq: Every day | ORAL | Status: DC
  Administered 2024-12-11 – 2024-12-16 (×6): 50 ug via ORAL
  Filled 2024-12-10 (×6): qty 1

## 2024-12-10 MED ORDER — POLYETHYLENE GLYCOL 3350 17 G PO PACK: 17.0000 g | PACK | Freq: Every day | ORAL | Status: DC | PRN

## 2024-12-10 MED ORDER — LACTATED RINGERS IV BOLUS
1000.0000 mL | Freq: Once | INTRAVENOUS | Status: AC
  Administered 2024-12-10: 17:00:00 1000 mL via INTRAVENOUS

## 2024-12-10 MED ORDER — BUSPIRONE HCL 5 MG PO TABS
5.0000 mg | ORAL_TABLET | Freq: Three times a day (TID) | ORAL | Status: DC | PRN
  Administered 2024-12-12: 12:00:00 5 mg via ORAL
  Filled 2024-12-10: qty 1

## 2024-12-10 MED ORDER — SODIUM CHLORIDE 0.9 % IV SOLN: INTRAVENOUS | Status: AC

## 2024-12-10 MED ORDER — INSULIN ASPART 100 UNIT/ML IJ SOLN
0.0000 [IU] | Freq: Three times a day (TID) | INTRAMUSCULAR | Status: DC
  Administered 2024-12-11: 17:00:00 3 [IU] via SUBCUTANEOUS
  Administered 2024-12-12: 18:00:00 1 [IU] via SUBCUTANEOUS
  Administered 2024-12-12: 14:00:00 2 [IU] via SUBCUTANEOUS
  Administered 2024-12-13: 1 [IU] via SUBCUTANEOUS
  Administered 2024-12-13 (×2): 2 [IU] via SUBCUTANEOUS
  Administered 2024-12-14: 1 [IU] via SUBCUTANEOUS
  Administered 2024-12-14: 5 [IU] via SUBCUTANEOUS
  Administered 2024-12-14: 2 [IU] via SUBCUTANEOUS
  Administered 2024-12-15: 1 [IU] via SUBCUTANEOUS
  Administered 2024-12-15: 5 [IU] via SUBCUTANEOUS
  Administered 2024-12-15 – 2024-12-16 (×2): 2 [IU] via SUBCUTANEOUS
  Filled 2024-12-10 (×12): qty 1

## 2024-12-10 MED ORDER — ACETAMINOPHEN 325 MG PO TABS
650.0000 mg | ORAL_TABLET | Freq: Four times a day (QID) | ORAL | Status: DC | PRN
  Administered 2024-12-12 – 2024-12-16 (×6): 650 mg via ORAL
  Filled 2024-12-10 (×6): qty 2

## 2024-12-10 MED ORDER — ATORVASTATIN CALCIUM 40 MG PO TABS
80.0000 mg | ORAL_TABLET | Freq: Every day | ORAL | Status: DC
  Administered 2024-12-11 – 2024-12-16 (×6): 80 mg via ORAL
  Filled 2024-12-10 (×6): qty 2

## 2024-12-10 MED ORDER — ASPIRIN 81 MG PO TBEC
81.0000 mg | DELAYED_RELEASE_TABLET | Freq: Every day | ORAL | Status: DC
  Administered 2024-12-11 – 2024-12-16 (×6): 81 mg via ORAL
  Filled 2024-12-10 (×6): qty 1

## 2024-12-10 MED ORDER — FLUTICASONE FUROATE-VILANTEROL 200-25 MCG/ACT IN AEPB
1.0000 | INHALATION_SPRAY | Freq: Every day | RESPIRATORY_TRACT | Status: DC
  Administered 2024-12-11 – 2024-12-16 (×6): 1 via RESPIRATORY_TRACT
  Filled 2024-12-10: qty 28

## 2024-12-10 NOTE — H&P (Signed)
 History and Physical    Patient: [name] White FMW:[id number] DOB: May 10, 1941 DOA: 12/10/2024 DOS: the patient was seen and examined on 12/10/2024 PCP: [name], MD  Patient coming from: SNF  Chief Complaint: Altered mental status Chief Complaint  Patient presents with   Altered Mental Status   HPI: [name] White is a 83 y.o. female with medical history significant of anemia, anxiety, asthma, depression, type 2 diabetes, hyperlipidemia, hypothyroidism, recent residence in a skilled nursing facility brought into the emergency room today with complaints of altered mental status.  According to patient's daughter who is present at bedside they were able to engage in conversation with her about 4 days ago when they visited her at the nursing home.  Today patient is not able to engage in any meaningful conversation with them and they believe she is off from her baseline.  Patient denied abdominal pain headache, nausea or vomiting.  She has not been having good oral intake.  ED course: Upon arrival to the emergency room patient had temperature 98.4, respiratory rate 22, pulse 79, blood pressure 101/55 saturating 95% on room air MRI of the brain did not show any acute intracranial pathology. Patient appeared clinically dehydrated. Urinalysis as well as chest x-ray did not show any acute infection.  Given patient's complaint hospitalist service was therefore contacted to admit patient for further management.  Review of Systems: unable to review all systems due to the inability of the patient to answer questions. Past Medical History:  Diagnosis Date   Anemia    Anxiety    Asthma    ASYMPTOMATIC POSTMENOPAUSAL STATUS 02/09/2009   Cataract    Cerebral infarction [facility])    Constipation 03/19/2013   Depression    DIABETES MELLITUS, TYPE II 09/14/2007   HYPERCHOLESTEROLEMIA 02/09/2009   HYPERTENSION 02/09/2009   HYPOTHYROIDISM 09/14/2007   Metabolic encephalopathy    MIGRAINE HEADACHE  09/14/2007   Neuropathy    OSTEOPOROSIS 02/09/2009   PANCREATITIS 09/14/2007   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   PONV (postoperative nausea and vomiting)    Protein calorie malnutrition    Rectocele 03/19/2013   Shortness of breath    SUPERFICIAL PHLEBITIS 09/14/2007   Varicose veins    Past Surgical History:  Procedure Laterality Date   ABDOMINAL HYSTERECTOMY     ANTERIOR AND POSTERIOR REPAIR N/A 04/02/2013   Procedure: ANTERIOR (CYSTOCELE) AND POSTERIOR REPAIR (RECTOCELE);  Surgeon: Norleen LULLA Server, MD;  Location: [facility];  Service: Gynecology;  Laterality: N/A;   APPENDECTOMY     BRAIN SURGERY     CATARACT EXTRACTION W/PHACO  12/05/2011   Procedure: CATARACT EXTRACTION PHACO AND INTRAOCULAR LENS PLACEMENT (IOC);  Surgeon: Cherene Mania;  Location: [facility];  Service: Ophthalmology;  Laterality: Left;  CDE:9.65   CHOLECYSTECTOMY     CRANIOTOMY N/A 06/04/2020   Procedure: Endoscopic Transphenoidal Resection of Recurrent PituitaryTumor;  Surgeon: Colon Shove, MD;  Location: [facility] OR;  Service: Neurosurgery;  Laterality: N/A;   CYSTOSCOPY     ENDOVENOUS ABLATION SAPHENOUS VEIN W/ LASER  11-03-2011   right greater saphenous vein   left leg done 10-2011   EYE SURGERY  98   right cataract extraction 98   LAPAROSCOPIC NISSEN FUNDOPLICATION     NM ESOPHAGEAL REFLUX  08-11-11   PITUITARY EXCISION  10/1997   POSTERIOR REPAIR     TRANSNASAL APPROACH N/A 06/04/2020   Procedure: TRANSNASAL APPROACH;  Surgeon: Mable Lenis, MD;  Location: [facility] OR;  Service: ENT;  Laterality: N/A;   TRANSPHENOIDAL / TRANSNASAL  HYPOPHYSECTOMY / RESECTION PITUITARY TUMOR  08-11-11   Social History:  reports that she has never smoked. She has never used smokeless tobacco. She reports that she does not drink alcohol and does not use drugs.  Allergies[1]  Family History  Problem Relation Age of Onset   Heart disease Mother    Asthma Mother    COPD Father    Heart disease Father    Stroke Father    Asthma Daughter     Migraines Daughter    Neuropathy Son    Cancer Neg Hx    Anesthesia problems Neg Hx    Hypotension Neg Hx    Malignant hyperthermia Neg Hx    Pseudochol deficiency Neg Hx     Prior to Admission medications  Medication Sig Start Date End Date Taking? Authorizing Provider  acetaminophen  (TYLENOL ) 325 MG tablet Take 2 tablets (650 mg total) by mouth every 6 (six) hours as needed for mild pain (pain score 1-3) (or Fever >/= 101). 01/01/24   Shahmehdi, Adriana LABOR, MD  albuterol  (VENTOLIN  HFA) 108 (90 Base) MCG/ACT inhaler Inhale 2 puffs into the lungs every 4 (four) hours as needed for wheezing or shortness of breath. 05/01/24   Johnson, Clanford L, MD  aspirin  EC 81 MG tablet Take 81 mg by mouth daily. Swallow whole.    [provider]  atorvastatin  (LIPITOR) 40 MG tablet Take 80 mg by mouth daily.    [provider]  azithromycin  (ZITHROMAX ) 500 MG tablet Take 1 tablet (500 mg total) by mouth daily. 11/12/24   Evonnie Lenis, MD  busPIRone  (BUSPAR ) 5 MG tablet Take 5 mg by mouth 3 (three) times daily as needed (anxiety).    [provider]  cefdinir  (OMNICEF ) 300 MG capsule Take 1 capsule (300 mg total) by mouth 2 (two) times daily. 11/12/24   Evonnie Lenis, MD  cyanocobalamin  (VITAMIN B12) 1000 MCG/ML injection Inject 1,000 mcg into the muscle every 30 (thirty) days. 07/26/24   [provider]  cycloSPORINE  (RESTASIS ) 0.05 % ophthalmic emulsion Place 1 drop into both eyes 2 (two) times daily.    [provider]  Estradiol  (IMVEXXY  MAINTENANCE PACK) 10 MCG INST Place 1 suppository vaginally 2 (two) times a week. Patient taking differently: Place 1 suppository vaginally 2 (two) times a week. Mondays and Fridays 10/14/24   [name] POUR, MD  fluticasone  furoate-vilanterol (BREO ELLIPTA ) 200-25 MCG/ACT AEPB Inhale 1 puff into the lungs daily. 10/16/24   Ricky Fines, MD  furosemide  (LASIX ) 20 MG tablet Take 1 tablet (20 mg total) by mouth daily. 11/12/24  11/12/25  Evonnie Lenis, MD  ipratropium-albuterol  (DUONEB) 0.5-2.5 (3) MG/3ML SOLN Take 3 mLs by nebulization every 4 (four) hours as needed (wheezing and SOB). Patient taking differently: Take 3 mLs by nebulization every 4 (four) hours as needed (wheezing and shortness of breath). 10/16/24   Ricky Fines, MD  ipratropium-albuterol  (DUONEB) 0.5-2.5 (3) MG/3ML SOLN Take 3 mLs by nebulization in the morning, at noon, and at bedtime.    [provider]  levothyroxine  (SYNTHROID ) 50 MCG tablet Take 50 mcg by mouth daily.    [provider]  metFORMIN  (GLUCOPHAGE ) 1000 MG tablet Take 1,000 mg by mouth 2 (two) times daily. 10/01/24   [provider]  midodrine  (PROAMATINE ) 5 MG tablet Take 5 mg by mouth 3 (three) times daily with meals. 06/05/24   [provider]  Multiple Vitamins-Minerals (THERA-M) TABS Take 1 tablet by mouth daily.    [provider]  nitrofurantoin  (  MACRODANTIN ) 50 MG capsule Take 1 capsule (50 mg total) by mouth at bedtime. 11/27/24   McKenzie, Belvie CROME, MD  NONFORMULARY OR COMPOUNDED ITEM If patient is incontinent of urine and/or stool, SNF staff are instructed to cleanse patient's genital area at least once every 8 hours and change diaper if soiled. 07/10/24   Gerldine Lauraine BROCKS, FNP  NONFORMULARY OR COMPOUNDED ITEM SNF staff are instructed to encourage hydration with water  at least once every 2-4 hours while patient is awake. 07/10/24   Larocco, Sarah C, FNP  NONFORMULARY OR COMPOUNDED ITEM SNF staff are instructed to prompt toileting and assist patient with ambulation to toilet at least once every 4-6 hours while awake. 07/10/24   Larocco, Sarah C, FNP  omeprazole  (PRILOSEC) 20 MG capsule Take 1 capsule (20 mg total) by mouth 2 (two) times daily before a meal. 10/16/24   Ricky Fines, MD  polyethylene glycol (MIRALAX  / GLYCOLAX ) 17 g packet Take 17 g by mouth daily as needed for mild constipation. 05/01/24   Johnson, Clanford L, MD  potassium  chloride (KLOR-CON ) 10 MEQ tablet Take 10 mEq by mouth daily. 05/15/24   [provider]  predniSONE  (DELTASONE ) 5 MG tablet Take 1 tablet (5 mg total) by mouth daily with breakfast. Restart the day after prednisone  taper is done 11/11/24   Tat, Alm, MD  pregabalin  (LYRICA ) 150 MG capsule Take 1 capsule (150 mg total) by mouth 2 (two) times daily. 03/19/24   Pearlean Manus, MD  rizatriptan  (MAXALT -MLT) 10 MG disintegrating tablet Take 10 mg by mouth as needed for migraine. 09/12/24   [provider]  tiZANidine  (ZANAFLEX ) 2 MG tablet Take 2 mg by mouth 2 (two) times daily as needed for muscle spasms. 06/21/24   [provider]  traZODone  (DESYREL ) 50 MG tablet Take 1 tablet (50 mg total) by mouth at bedtime as needed for sleep. 03/19/24   Pearlean Manus, MD  TRINTELLIX  10 MG TABS tablet Take 10 mg by mouth daily. 03/09/24   [provider]  umeclidinium bromide  (INCRUSE ELLIPTA ) 62.5 MCG/ACT AEPB Inhale 1 puff into the lungs daily. 10/16/24   Ricky Fines, MD  venlafaxine  (EFFEXOR -XR) 150 MG 24 hr capsule Take 150 mg by mouth every morning.    [provider]    Physical Exam: Vitals:   12/10/24 1634 12/10/24 1642 12/10/24 1646 12/10/24 1850  BP:   104/69 128/73  Pulse:   87 79  Resp:   19 16  Temp: 99 F (37.2 C) 98.6 F (37 C)    TempSrc: Rectal Rectal    SpO2:   95% 99%  Weight:      Height:       General -elderly female, laying in bed in no respiratory distress HEENT - PERRLA, EOMI, atraumatic head, non tender sinuses. Lung -clear to auscultation Heart - S1, S2 heard, no murmurs, rubs Abdomen -nontender Neuro -awake follows commands exhibiting symptoms of dementia Skin - Warm and dry. Musculoskeletal: No edema   Data Reviewed: SABRA  MRI of the brain did not show any acute intracranial pathology however it did show remote lacunar infarcts Chest x-ray did not show any acute intrapulmonary pathology     Latest Ref Rng & Units  12/10/2024    2:09 PM 11/10/2024    4:40 AM 11/09/2024    4:28 AM  CBC  WBC 4.0 - 10.5 K/uL 13.9  12.1  13.3   Hemoglobin 12.0 - 15.0 g/dL 88.7  9.3  9.9   Hematocrit 36.0 -  46.0 % 34.2  28.3  30.7   Platelets 150 - 400 K/uL 245  210  215        Latest Ref Rng & Units 12/10/2024    2:09 PM 11/11/2024    4:35 AM 11/10/2024    4:40 AM  BMP  Glucose 70 - 99 mg/dL 823  792  81   BUN 8 - 23 mg/dL 15  15  13    Creatinine 0.44 - 1.00 mg/dL 9.40  9.32  9.54   Sodium 135 - 145 mmol/L 135  141  134   Potassium 3.5 - 5.1 mmol/L 4.7  4.4  3.8   Chloride 98 - 111 mmol/L 98  102  99   CO2 22 - 32 mmol/L 23  27  25    Calcium  8.9 - 10.3 mg/dL 8.5  9.2  8.3      Assessment and Plan:  Acute metabolic encephalopathy likely secondary to dehydration Patient is clinically dehydrated IV fluid initiated We will monitor neurochecks closely Delirium precaution in place Avoid any sedating medication PT OT consult  Mild leukocytosis without signs of infection This is most likely reactive in the setting of dehydration We will follow-up on blood cultures Antibiotics not indicated at this time  Normocytic anemia likely secondary to anemia of chronic disease No indication for transfusion at this time Continue to monitor CBC  Chronic asthma Continue as needed nebs  Depression Continue buspirone   Type 2 diabetes mellitus Continue to monitor glucose level Continue insulin  therapy  Hyperlipidemia Continue statin therapy  Hypothyroidism Continue home dose of levothyroxine   DVT prophylaxis-continue Lovenox      Advance Care Planning:   Code Status: Prior DNI DNR  Consults: None  Family Communication: Discussed with patient's daughters at bedside  Severity of Illness: The appropriate patient status for this patient is OBSERVATION. Observation status is judged to be reasonable and necessary in order to provide the required intensity of service to ensure the patient's safety. The  patient's presenting symptoms, physical exam findings, and initial radiographic and laboratory data in the context of their medical condition is felt to place them at decreased risk for further clinical deterioration. Furthermore, it is anticipated that the patient will be medically stable for discharge from the hospital within 2 midnights of admission.   Author: Drue ONEIDA Potter, MD 12/10/2024 7:45 PM  For on call review [email or web address].      [1]  Allergies Allergen Reactions   Betadine [Povidone Iodine] Hives   Povidone-Iodine Hives   Penicillins Rash and Dermatitis

## 2024-12-10 NOTE — ED Provider Notes (Cosign Needed Addendum)
 [facility]MENT AT [facility] Provider Note   CSN: [id number] Arrival date & time: 12/10/24  1343     Patient presents with: Altered Mental Status   [name] White is a 83 y.o. female with a history of hypertension, anemia, pituitary adenoma, type 2 diabetes, asthma, CVA, frequent UTIs resulting in metabolic encephalopathy presenting from her nursing home with reduced responsiveness which nursing staff noticed today upon waking this am.  She is on Macrobid  for preventative UTIs.  History is limited secondary to altered mental status.  Call placed to dg [name], confirming pt at baseline is mostly wheelchair bound but can walk using walker short distances.  Last saw her 2 days ago and was her normal self,  conversive, but has been confused since her cva last year, requiring SNF.   Pt is DNR status.   The history is provided by medical records and the EMS personnel. The history is limited by the condition of the patient.       Prior to Admission medications  Medication Sig Start Date End Date Taking? Authorizing Provider  acetaminophen  (TYLENOL ) 325 MG tablet Take 2 tablets (650 mg total) by mouth every 6 (six) hours as needed for mild pain (pain score 1-3) (or Fever >/= 101). 01/01/24   ShahmehdiAdriana LABOR, MD  albuterol  (VENTOLIN  HFA) 108 (90 Base) MCG/ACT inhaler Inhale 2 puffs into the lungs every 4 (four) hours as needed for wheezing or shortness of breath. 05/01/24   Johnson, Clanford L, MD  aspirin  EC 81 MG tablet Take 81 mg by mouth daily. Swallow whole.    [provider]  atorvastatin  (LIPITOR) 40 MG tablet Take 80 mg by mouth daily.    [provider]  azithromycin  (ZITHROMAX ) 500 MG tablet Take 1 tablet (500 mg total) by mouth daily. 11/12/24   Evonnie Lenis, MD  busPIRone  (BUSPAR ) 5 MG tablet Take 5 mg by mouth 3 (three) times daily as needed (anxiety).    [provider]  cefdinir  (OMNICEF ) 300 MG capsule Take 1 capsule  (300 mg total) by mouth 2 (two) times daily. 11/12/24   Evonnie Lenis, MD  cyanocobalamin  (VITAMIN B12) 1000 MCG/ML injection Inject 1,000 mcg into the muscle every 30 (thirty) days. 07/26/24   [provider]  cycloSPORINE  (RESTASIS ) 0.05 % ophthalmic emulsion Place 1 drop into both eyes 2 (two) times daily.    [provider]  Estradiol  (IMVEXXY  MAINTENANCE PACK) 10 MCG INST Place 1 suppository vaginally 2 (two) times a week. Patient taking differently: Place 1 suppository vaginally 2 (two) times a week. Mondays and Fridays 10/14/24   [name] POUR, MD  fluticasone  furoate-vilanterol (BREO ELLIPTA ) 200-25 MCG/ACT AEPB Inhale 1 puff into the lungs daily. 10/16/24   Ricky Fines, MD  furosemide  (LASIX ) 20 MG tablet Take 1 tablet (20 mg total) by mouth daily. 11/12/24 11/12/25  Evonnie Lenis, MD  ipratropium-albuterol  (DUONEB) 0.5-2.5 (3) MG/3ML SOLN Take 3 mLs by nebulization every 4 (four) hours as needed (wheezing and SOB). Patient taking differently: Take 3 mLs by nebulization every 4 (four) hours as needed (wheezing and shortness of breath). 10/16/24   Ricky Fines, MD  ipratropium-albuterol  (DUONEB) 0.5-2.5 (3) MG/3ML SOLN Take 3 mLs by nebulization in the morning, at noon, and at bedtime.    [provider]  levothyroxine  (SYNTHROID ) 50 MCG tablet Take 50 mcg by mouth daily.    [provider]  metFORMIN  (GLUCOPHAGE ) 1000 MG tablet Take 1,000 mg by mouth 2 (two) times  daily. 10/01/24   [provider]  midodrine  (PROAMATINE ) 5 MG tablet Take 5 mg by mouth 3 (three) times daily with meals. 06/05/24   [provider]  Multiple Vitamins-Minerals (THERA-M) TABS Take 1 tablet by mouth daily.    [provider]  nitrofurantoin  (MACRODANTIN ) 50 MG capsule Take 1 capsule (50 mg total) by mouth at bedtime. 11/27/24   McKenzie, Belvie CROME, MD  NONFORMULARY OR COMPOUNDED ITEM If patient is incontinent of urine and/or stool, SNF staff are  instructed to cleanse patient's genital area at least once every 8 hours and change diaper if soiled. 07/10/24   Gerldine Lauraine BROCKS, FNP  NONFORMULARY OR COMPOUNDED ITEM SNF staff are instructed to encourage hydration with water  at least once every 2-4 hours while patient is awake. 07/10/24   Larocco, Sarah C, FNP  NONFORMULARY OR COMPOUNDED ITEM SNF staff are instructed to prompt toileting and assist patient with ambulation to toilet at least once every 4-6 hours while awake. 07/10/24   Larocco, Sarah C, FNP  omeprazole  (PRILOSEC) 20 MG capsule Take 1 capsule (20 mg total) by mouth 2 (two) times daily before a meal. 10/16/24   Ricky Fines, MD  polyethylene glycol (MIRALAX  / GLYCOLAX ) 17 g packet Take 17 g by mouth daily as needed for mild constipation. 05/01/24   Johnson, Clanford L, MD  potassium chloride  (KLOR-CON ) 10 MEQ tablet Take 10 mEq by mouth daily. 05/15/24   [provider]  predniSONE  (DELTASONE ) 5 MG tablet Take 1 tablet (5 mg total) by mouth daily with breakfast. Restart the day after prednisone  taper is done 11/11/24   Tat, Alm, MD  pregabalin  (LYRICA ) 150 MG capsule Take 1 capsule (150 mg total) by mouth 2 (two) times daily. 03/19/24   Pearlean Manus, MD  rizatriptan  (MAXALT -MLT) 10 MG disintegrating tablet Take 10 mg by mouth as needed for migraine. 09/12/24   [provider]  tiZANidine  (ZANAFLEX ) 2 MG tablet Take 2 mg by mouth 2 (two) times daily as needed for muscle spasms. 06/21/24   [provider]  traZODone  (DESYREL ) 50 MG tablet Take 1 tablet (50 mg total) by mouth at bedtime as needed for sleep. 03/19/24   Pearlean Manus, MD  TRINTELLIX  10 MG TABS tablet Take 10 mg by mouth daily. 03/09/24   [provider]  umeclidinium bromide  (INCRUSE ELLIPTA ) 62.5 MCG/ACT AEPB Inhale 1 puff into the lungs daily. 10/16/24   Ricky Fines, MD  venlafaxine  (EFFEXOR -XR) 150 MG 24 hr capsule Take 150 mg by mouth every morning.    [provider]     Allergies: Betadine [povidone iodine], Povidone-iodine, and Penicillins    Review of Systems  Unable to perform ROS: Mental status change    Updated Vital Signs BP 128/73   Pulse 79   Temp 98.6 F (37 C) (Rectal)   Resp 16   Ht 5' 7 (1.702 m)   Wt 59 kg   SpO2 99%   BMI 20.37 kg/m   Physical Exam Vitals and nursing note reviewed.  Constitutional:      General: She is sleeping. She is not in acute distress.    Appearance: She is well-developed.  HENT:     Head: Normocephalic and atraumatic.     Mouth/Throat:     Mouth: Mucous membranes are dry.  Eyes:     Conjunctiva/sclera: Conjunctivae normal.  Cardiovascular:     Rate and Rhythm: Normal rate and regular rhythm.     Heart sounds: Normal heart sounds.  Pulmonary:  Effort: Pulmonary effort is normal.     Breath sounds: Normal breath sounds. No wheezing.  Abdominal:     General: Bowel sounds are normal.     Palpations: Abdomen is soft.     Tenderness: There is no abdominal tenderness. There is no guarding.  Musculoskeletal:        General: Normal range of motion.     Cervical back: Normal range of motion.  Skin:    General: Skin is warm and dry.  Neurological:     Mental Status: She is lethargic.     Comments: Responds briefly to sternal rub.      (all labs ordered are listed, but only abnormal results are displayed) Labs Reviewed  COMPREHENSIVE METABOLIC PANEL WITH GFR - Abnormal; Notable for the following components:      Result Value   Glucose, Bld 176 (*)    Calcium  8.5 (*)    Total Protein 6.4 (*)    All other components within normal limits  CBC WITH DIFFERENTIAL/PLATELET - Abnormal; Notable for the following components:   WBC 13.9 (*)    RBC 3.84 (*)    Hemoglobin 11.2 (*)    HCT 34.2 (*)    RDW 17.7 (*)    Neutro Abs 9.9 (*)    Monocytes Absolute 1.5 (*)    Abs Immature Granulocytes 0.11 (*)    All other components within normal limits  LACTIC ACID, PLASMA - Abnormal; Notable for the  following components:   Lactic Acid, Venous 2.0 (*)    All other components within normal limits  CBG MONITORING, ED - Abnormal; Notable for the following components:   Glucose-Capillary 180 (*)    All other components within normal limits  CULTURE, BLOOD (ROUTINE X 2)  CULTURE, BLOOD (ROUTINE X 2)  URINALYSIS, ROUTINE W REFLEX MICROSCOPIC  LACTIC ACID, PLASMA    EKG: None  Radiology: MR BRAIN WO CONTRAST Result Date: 12/10/2024 EXAM: MRI BRAIN WITHOUT CONTRAST 12/10/2024 05:50:24 PM TECHNIQUE: Multiplanar multisequence MRI of the head/brain was performed without the administration of intravenous contrast. COMPARISON: Comparison made with CT from earlier the same day, as well as prior exam from 10/25/2024. CLINICAL HISTORY: Mental status change, unknown cause. FINDINGS: BRAIN AND VENTRICLES: No acute infarct. No intracranial hemorrhage. No mass. No midline shift. No hydrocephalus. Generalized age-related cerebral atrophy. Patchy and confluent T2/FLAIR hyperintensity involving the periventricular and deep white matter of the hemispheres, consistent with chronic small vessel ischemic disease, moderate in nature. Multiple remote lacunar infarcts present about the hemispheric cerebral white matter. Scattered chronic remote bilateral cerebellar infarcts noted. Previously identified pituitary macroadenoma measures up to approximately 2.4 cm, relatively stable from prior. Normal flow voids. ORBITS: Prior bilateral ocular lens replacement. SINUSES AND MASTOIDS: No acute abnormality. BONES AND SOFT TISSUES: Normal marrow signal. No acute soft tissue abnormality. IMPRESSION: 1. No acute intracranial abnormality. 2. Moderate chronic small vessel ischemic disease with multiple remote lacunar infarcts about the hemispheric cerebral white matter and cerebellum. 3. 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 Electronically signed by: Morene Hoard MD 12/10/2024 06:34 PM EST RP  Workstation: [id number]   CT Head Wo Contrast Result Date: 12/10/2024 EXAM: CT HEAD WITHOUT CONTRAST 12/10/2024 03:32:44 PM TECHNIQUE: CT of the head was performed without the administration of intravenous contrast. Automated exposure control, iterative reconstruction, and/or weight based adjustment of the mA/kV was utilized to reduce the radiation dose to as low as reasonably achievable. COMPARISON: 10/12/2024 CLINICAL HISTORY: Altered mental status, nontraumatic (Ped 0-17y) FINDINGS: BRAIN  AND VENTRICLES: No acute hemorrhage. No evidence of acute infarct. Global parenchymal atrophy with prominent CSF spaces and sulci. Periventricular and subcortical white matter patchy hypodensities, consistent with chronic small-vessel ischemic changes. Old right cerebellar infarct. Additional small remote left cerebellar infarct. Postsurgical changes of prior transsphenoidal resection of pituitary tumor. There is residual slightly hyperattenuating soft tissue along the superior aspect of the sella extending to the suprasellar cistern, better evaluated on recent prior MRI. Postsurgical changes of the sella. No hydrocephalus. No extra-axial collection. No mass effect or midline shift. ORBITS: Bilateral lens replacement. Vascular calcifications. No acute abnormality. SINUSES: No acute abnormality. SOFT TISSUES AND SKULL: No acute soft tissue abnormality. No skull fracture. IMPRESSION: 1. No acute intracranial abnormality. 2. Global parenchymal atrophy, chronic small-vessel ischemic changes, and remote cerebellar infarcts. 3. Postsurgical changes of transsphenoidal resection of pituitary tumor with residual mass in the sella and suprasellar cistern, better evaluated on recent prior MRI. Electronically signed by: Donnice Mania MD 12/10/2024 04:01 PM EST RP Workstation: [id number]   DG Chest Portable 1 View Result Date: 12/10/2024 CLINICAL DATA:  Altered mental status EXAM: PORTABLE CHEST 1 VIEW COMPARISON:  November 08, 2024  FINDINGS: Stable cardiomediastinal silhouette. No definite acute pulmonary abnormality seen. The visualized skeletal structures are unremarkable. IMPRESSION: No active disease. Electronically Signed   By: Lynwood Landy Raddle M.D.   On: 12/10/2024 14:26     Procedures   Medications Ordered in the ED  sodium chloride  0.9 % bolus 500 mL (0 mLs Intravenous Stopped 12/10/24 1515)  lactated ringers  bolus 1,000 mL (0 mLs Intravenous Stopped 12/10/24 1859)                                    Medical Decision Making Patient presenting from her local nursing home with altered mental status, very drowsy, difficult keeping patient awake.  She does have a history of frequent UTIs and is on Macrobid  prophylaxis daily.  She is afebrile here, her initial blood pressure was low at 101/55, however her other vitals are normal range, she is afebrile.  Labs and imaging have been ordered with no obvious source of her somnolence found.  She will require admission.  Amount and/or Complexity of Data Reviewed Labs: ordered.    Details: Labs are significant for an initial lactate of 2.0, with normal VS, afebrile,  this is not sepsis.   Urinalysis is negative, CMET significant for glucose of 176, her CBC revealing a WBC count of 13.9, this leukocytosis is of unclear etiology as no source of infection has yet been found.  Blood cultures are pending.   Radiology: ordered.    Details: Chest x-ray negative for pneumonia, CT head negative for hemorrhagic CVA, MRI negative for ischemic CVA. Discussion of management or test interpretation with external provider(s): Call placed to the hospitalist for admission.  Spoke to Dr. Djan who accepts pt for admission.  Risk Decision regarding hospitalization.        Final diagnoses:  Somnolence    ED Discharge Orders     None          Birdena Mliss RIGGERS 12/10/24 1938    Birdena Mliss, PA-C 12/10/24 1939

## 2024-12-10 NOTE — ED Triage Notes (Signed)
 Pt arrived via [facility] from [facility] SNF for evaluation of AMS. Per staff at facility, the Pt has not been responding as usual to them since this morning and report Pt has Hx of recurrent UTI's. Per EMS, Pts V/S are stable.

## 2024-12-10 NOTE — ED Notes (Signed)
 Patient transported to CT

## 2024-12-10 NOTE — ED Triage Notes (Signed)
 Pt presents very lethargic during Triage, and responds to painful stimuli. EMS initiated IV access and have NS running via gravity drip upon arrival.

## 2024-12-11 DIAGNOSIS — F01518 Vascular dementia, unspecified severity, with other behavioral disturbance: Secondary | ICD-10-CM

## 2024-12-11 DIAGNOSIS — D638 Anemia in other chronic diseases classified elsewhere: Secondary | ICD-10-CM | POA: Diagnosis present

## 2024-12-11 DIAGNOSIS — R109 Unspecified abdominal pain: Secondary | ICD-10-CM | POA: Diagnosis not present

## 2024-12-11 DIAGNOSIS — D72829 Elevated white blood cell count, unspecified: Secondary | ICD-10-CM | POA: Diagnosis present

## 2024-12-11 DIAGNOSIS — J45909 Unspecified asthma, uncomplicated: Secondary | ICD-10-CM | POA: Diagnosis present

## 2024-12-11 DIAGNOSIS — Z993 Dependence on wheelchair: Secondary | ICD-10-CM | POA: Diagnosis not present

## 2024-12-11 DIAGNOSIS — E869 Volume depletion, unspecified: Secondary | ICD-10-CM | POA: Diagnosis not present

## 2024-12-11 DIAGNOSIS — F32A Depression, unspecified: Secondary | ICD-10-CM | POA: Diagnosis present

## 2024-12-11 DIAGNOSIS — G9341 Metabolic encephalopathy: Secondary | ICD-10-CM | POA: Diagnosis present

## 2024-12-11 DIAGNOSIS — R400 Somnolence: Secondary | ICD-10-CM | POA: Diagnosis not present

## 2024-12-11 DIAGNOSIS — R4182 Altered mental status, unspecified: Secondary | ICD-10-CM | POA: Diagnosis present

## 2024-12-11 DIAGNOSIS — I10 Essential (primary) hypertension: Secondary | ICD-10-CM | POA: Diagnosis present

## 2024-12-11 DIAGNOSIS — E1140 Type 2 diabetes mellitus with diabetic neuropathy, unspecified: Secondary | ICD-10-CM | POA: Diagnosis present

## 2024-12-11 DIAGNOSIS — Z7984 Long term (current) use of oral hypoglycemic drugs: Secondary | ICD-10-CM | POA: Diagnosis not present

## 2024-12-11 DIAGNOSIS — E039 Hypothyroidism, unspecified: Secondary | ICD-10-CM | POA: Diagnosis present

## 2024-12-11 DIAGNOSIS — E860 Dehydration: Secondary | ICD-10-CM | POA: Diagnosis present

## 2024-12-11 DIAGNOSIS — K219 Gastro-esophageal reflux disease without esophagitis: Secondary | ICD-10-CM | POA: Diagnosis present

## 2024-12-11 DIAGNOSIS — M6281 Muscle weakness (generalized): Secondary | ICD-10-CM | POA: Diagnosis present

## 2024-12-11 DIAGNOSIS — M810 Age-related osteoporosis without current pathological fracture: Secondary | ICD-10-CM | POA: Diagnosis present

## 2024-12-11 DIAGNOSIS — Z7982 Long term (current) use of aspirin: Secondary | ICD-10-CM | POA: Diagnosis not present

## 2024-12-11 DIAGNOSIS — F0150 Vascular dementia without behavioral disturbance: Secondary | ICD-10-CM

## 2024-12-11 DIAGNOSIS — R197 Diarrhea, unspecified: Secondary | ICD-10-CM | POA: Diagnosis not present

## 2024-12-11 DIAGNOSIS — Z8249 Family history of ischemic heart disease and other diseases of the circulatory system: Secondary | ICD-10-CM | POA: Diagnosis not present

## 2024-12-11 DIAGNOSIS — F0153 Vascular dementia, unspecified severity, with mood disturbance: Secondary | ICD-10-CM | POA: Diagnosis present

## 2024-12-11 DIAGNOSIS — F419 Anxiety disorder, unspecified: Secondary | ICD-10-CM | POA: Diagnosis present

## 2024-12-11 DIAGNOSIS — Z66 Do not resuscitate: Secondary | ICD-10-CM | POA: Diagnosis present

## 2024-12-11 DIAGNOSIS — Z8673 Personal history of transient ischemic attack (TIA), and cerebral infarction without residual deficits: Secondary | ICD-10-CM | POA: Diagnosis not present

## 2024-12-11 DIAGNOSIS — Z79890 Hormone replacement therapy (postmenopausal): Secondary | ICD-10-CM | POA: Diagnosis not present

## 2024-12-11 DIAGNOSIS — E7800 Pure hypercholesterolemia, unspecified: Secondary | ICD-10-CM | POA: Diagnosis present

## 2024-12-11 LAB — BASIC METABOLIC PANEL WITH GFR
Anion gap: 12 (ref 5–15)
BUN: 13 mg/dL (ref 8–23)
CO2: 23 mmol/L (ref 22–32)
Calcium: 8.5 mg/dL — ABNORMAL LOW (ref 8.9–10.3)
Chloride: 103 mmol/L (ref 98–111)
Creatinine, Ser: 0.51 mg/dL (ref 0.44–1.00)
GFR, Estimated: >60 mL/min (ref >60)
Glucose, Bld: 98 mg/dL (ref 70–99)
Potassium: 3.8 mmol/L (ref 3.5–5.1)
Sodium: 139 mmol/L (ref 135–145)

## 2024-12-11 LAB — CBC
HCT: 32.8 % — ABNORMAL LOW (ref 36.0–46.0)
Hemoglobin: 10.8 g/dL — ABNORMAL LOW (ref 12.0–15.0)
MCH: 29.3 pg (ref 26.0–34.0)
MCHC: 32.9 g/dL (ref 30.0–36.0)
MCV: 89.1 fL (ref 80.0–100.0)
Platelets: 248 K/uL (ref 150–400)
RBC: 3.68 MIL/uL — ABNORMAL LOW (ref 3.87–5.11)
RDW: 17.5 % — ABNORMAL HIGH (ref 11.5–15.5)
WBC: 11.2 K/uL — ABNORMAL HIGH (ref 4.0–10.5)
nRBC: 0 % (ref 0.0–0.2)

## 2024-12-11 LAB — GLUCOSE, CAPILLARY
Glucose-Capillary: 126 mg/dL — ABNORMAL HIGH (ref 70–99)
Glucose-Capillary: 126 mg/dL — ABNORMAL HIGH (ref 70–99)
Glucose-Capillary: 160 mg/dL — ABNORMAL HIGH (ref 70–99)
Glucose-Capillary: 72 mg/dL (ref 70–99)
Glucose-Capillary: 87 mg/dL (ref 70–99)

## 2024-12-11 NOTE — Progress Notes (Signed)
 Progress Note   Patient: [name] White FMW:[id number] DOB: June 27, 1941 DOA: 12/10/2024  DOS: the patient was seen and examined on 12/11/2024   Brief hospital course:  83 y.o. female with medical history significant of anemia, anxiety, asthma, depression, type 2 diabetes, hyperlipidemia, hypothyroidism, recent residence in a skilled nursing facility brought into the emergency room today with complaints of altered mental status.   Assessment and Plan:  Acute metabolic encephalopathy - Patient not oriented, encephalopathic appearing.  Etiology unclear but likely multifactorial with possible underlying vascular dementia, dehydration, hospital delirium.  Does not appear to be any acute infectious source, UA negative, chest x-ray clear, no fever or other symptoms.  IV fluids on board.  Continue to attempt to orient.  Delirium precautions.  Continue to monitor closely.  Dehydration/volume depletion - Decreased p.o. intake, likely contributing.  IV fluid hydration on board, NS at 75 mL/h.  Monitor urine output and recheck BMP in AM.  Anemia of chronic disease - No active bleeding appreciated.  Monitor CBC in AM.  Diabetes mellitus - Insulin  sliding scale on board.  Chronic asthma - Does not appear to be acute exacerbation.  Nebulizers as needed.  Hyperlipidemia/hypothyroidism - Resume home medication regiment.  Physical debilitation muscle weakness - Patient very frail, recent residence in SNF.  Once less encephalopathic, will have PT/OT evaluate.  Likely return to SNF.   Subjective: Patient evaluated this morning, encephalopathic, curled up in a ball, intermittently responding to questions but otherwise using unintelligible speech/gibberish.  Daughter at bedside corroborating patient is not at her baseline.  Admits that patient does not eat or drink much.  Noted possible history of vascular dementia but usually is alert, independent in ADLs.  Patient denies any pain, shortness of breath,  nausea, vomiting, diarrhea.  Physical Exam:  Vitals:   12/11/24 0008 12/11/24 0407 12/11/24 0850 12/11/24 0933  BP: 111/70 96/66  116/62  Pulse: 84 88  89  Resp: 16     Temp: 98.2 F (36.8 C) 98.5 F (36.9 C)  98.2 F (36.8 C)  TempSrc: Oral Oral  Oral  SpO2: 98% 98% 92% 99%  Weight: 59.7 kg     Height:        GENERAL: Encephalopathic, frail HEENT:  EOMI CARDIOVASCULAR:  RRR, no murmurs appreciated RESPIRATORY:  Clear to auscultation, no wheezing, rales, or rhonchi GASTROINTESTINAL:  Soft, nontender, nondistended EXTREMITIES: Thin, no LE edema bilaterally NEURO: Moves all 4 extremities, not oriented SKIN: Scattered ecchymosis PSYCH: Encephalopathic    Data Reviewed:  Imaging Studies: MR BRAIN WO CONTRAST Result Date: 12/10/2024 EXAM: MRI BRAIN WITHOUT CONTRAST 12/10/2024 05:50:24 PM TECHNIQUE: Multiplanar multisequence MRI of the head/brain was performed without the administration of intravenous contrast. COMPARISON: Comparison made with CT from earlier the same day, as well as prior exam from 10/25/2024. CLINICAL HISTORY: Mental status change, unknown cause. FINDINGS: BRAIN AND VENTRICLES: No acute infarct. No intracranial hemorrhage. No mass. No midline shift. No hydrocephalus. Generalized age-related cerebral atrophy. Patchy and confluent T2/FLAIR hyperintensity involving the periventricular and deep white matter of the hemispheres, consistent with chronic small vessel ischemic disease, moderate in nature. Multiple remote lacunar infarcts present about the hemispheric cerebral white matter. Scattered chronic remote bilateral cerebellar infarcts noted. Previously identified pituitary macroadenoma measures up to approximately 2.4 cm, relatively stable from prior. Normal flow voids. ORBITS: Prior bilateral ocular lens replacement. SINUSES AND MASTOIDS: No acute abnormality. BONES AND SOFT TISSUES: Normal marrow signal. No acute soft tissue abnormality. IMPRESSION: 1. No acute  intracranial abnormality. 2. Moderate chronic  small vessel ischemic disease with multiple remote lacunar infarcts about the hemispheric cerebral white matter and cerebellum. 3. 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 Electronically signed by: Morene Hoard MD 12/10/2024 06:34 PM EST RP Workstation: [id number]   CT Head Wo Contrast Result Date: 12/10/2024 EXAM: CT HEAD WITHOUT CONTRAST 12/10/2024 03:32:44 PM TECHNIQUE: CT of the head was performed without the administration of intravenous contrast. Automated exposure control, iterative reconstruction, and/or weight based adjustment of the mA/kV was utilized to reduce the radiation dose to as low as reasonably achievable. COMPARISON: 10/12/2024 CLINICAL HISTORY: Altered mental status, nontraumatic (Ped 0-17y) FINDINGS: BRAIN AND VENTRICLES: No acute hemorrhage. No evidence of acute infarct. Global parenchymal atrophy with prominent CSF spaces and sulci. Periventricular and subcortical white matter patchy hypodensities, consistent with chronic small-vessel ischemic changes. Old right cerebellar infarct. Additional small remote left cerebellar infarct. Postsurgical changes of prior transsphenoidal resection of pituitary tumor. There is residual slightly hyperattenuating soft tissue along the superior aspect of the sella extending to the suprasellar cistern, better evaluated on recent prior MRI. Postsurgical changes of the sella. No hydrocephalus. No extra-axial collection. No mass effect or midline shift. ORBITS: Bilateral lens replacement. Vascular calcifications. No acute abnormality. SINUSES: No acute abnormality. SOFT TISSUES AND SKULL: No acute soft tissue abnormality. No skull fracture. IMPRESSION: 1. No acute intracranial abnormality. 2. Global parenchymal atrophy, chronic small-vessel ischemic changes, and remote cerebellar infarcts. 3. Postsurgical changes of transsphenoidal resection of pituitary tumor with residual mass  in the sella and suprasellar cistern, better evaluated on recent prior MRI. Electronically signed by: Donnice Mania MD 12/10/2024 04:01 PM EST RP Workstation: [id number]   DG Chest Portable 1 View Result Date: 12/10/2024 CLINICAL DATA:  Altered mental status EXAM: PORTABLE CHEST 1 VIEW COMPARISON:  November 08, 2024 FINDINGS: Stable cardiomediastinal silhouette. No definite acute pulmonary abnormality seen. The visualized skeletal structures are unremarkable. IMPRESSION: No active disease. Electronically Signed   By: Lynwood Landy Raddle M.D.   On: 12/10/2024 14:26    There are no new results to review at this time.  Previous records (including but not limited to H&P, progress notes, nursing notes, TOC management) were reviewed in assessment of this patient.  Labs: CBC: Recent Labs  Lab 12/10/24 1409 12/11/24 0504  WBC 13.9* 11.2*  NEUTROABS 9.9*  --   HGB 11.2* 10.8*  HCT 34.2* 32.8*  MCV 89.1 89.1  PLT 245 248   Basic Metabolic Panel: Recent Labs  Lab 12/10/24 1409 12/11/24 0504  NA 135 139  K 4.7 3.8  CL 98 103  CO2 23 23  GLUCOSE 176* 98  BUN 15 13  CREATININE 0.59 0.51  CALCIUM  8.5* 8.5*   Liver Function Tests: Recent Labs  Lab 12/10/24 1409  AST 28  ALT 25  ALKPHOS 42  BILITOT 1.0  PROT 6.4*  ALBUMIN 3.9   CBG: Recent Labs  Lab 12/10/24 1419 12/11/24 0015 12/11/24 0738  GLUCAP 180* 87 72    Scheduled Meds:  aspirin  EC  81 mg Oral Daily   atorvastatin   80 mg Oral Daily   enoxaparin  (LOVENOX ) injection  40 mg Subcutaneous Q24H   fluticasone  furoate-vilanterol  1 puff Inhalation Daily   insulin  aspart  0-9 Units Subcutaneous TID WC   levothyroxine   50 mcg Oral Q0600   pantoprazole   40 mg Oral Daily   Continuous Infusions:  sodium chloride  75 mL/hr at 12/11/24 0108   PRN Meds:.acetaminophen , albuterol , busPIRone , polyethylene glycol  Family Communication: At bedside  Disposition: Status is:  Observation The patient remains OBS appropriate and  will d/c before 2 midnights.     Time spent: 36 minutes  Length of inpatient stay: 0 days  Author: Carliss LELON Canales, DO 12/11/2024 10:46 AM  For on call review [email or web address].

## 2024-12-11 NOTE — Evaluation (Signed)
 Occupational Therapy Evaluation Patient Details Name: [name] White MRN: [id number] DOB: 08/21/1941 Today's Date: 12/11/2024   History of Present Illness   [name] White is a 83 y.o. female with medical history significant of anemia, anxiety, asthma, depression, type 2 diabetes, hyperlipidemia, hypothyroidism, recent residence in a skilled nursing facility brought into the emergency room today with complaints of altered mental status.  According to patient's daughter who is present at bedside they were able to engage in conversation with her about 4 days ago when they visited her at the nursing home.  Today patient is not able to engage in any meaningful conversation with them and they believe she is off from her baseline.  Patient denied abdominal pain headache, nausea or vomiting.  She has not been having good oral intake. (per MD)     Clinical Impressions Pt tolerated OT and PT co-evaluation, but was not coherent in verbalization. Pt required mod to max A for bed mobility. Max to total assist for stand pivot to bed and back without AD. B UE generally weak and difficult to fully assess due to cognitive deficits. Pt left in the chair with call bell within reach. Pt will benefit from continued OT in the hospital to increase strength, balance, and endurance for safe ADL's.        If plan is discharge home, recommend the following:   A lot of help with walking and/or transfers;A lot of help with bathing/dressing/bathroom;Assistance with cooking/housework;Assistance with feeding;Direct supervision/assist for medications management;Assist for transportation;Help with stairs or ramp for entrance;Supervision due to cognitive status     Functional Status Assessment   Patient has had a recent decline in their functional status and/or demonstrates limited ability to make significant improvements in function in a reasonable and predictable amount of time     Equipment Recommendations   None  recommended by OT             Precautions/Restrictions   Precautions Precautions: Fall Recall of Precautions/Restrictions: Impaired Restrictions Weight Bearing Restrictions Per Provider Order: No     Mobility Bed Mobility Overal bed mobility: Needs Assistance Bed Mobility: Supine to Sit, Sit to Supine     Supine to sit: Mod assist, Max assist Sit to supine: Max assist   General bed mobility comments: HOB flat; much physical assist; pt able to assist some with trunk control to come to sit.    Transfers Overall transfer level: Needs assistance Equipment used: 1 person hand held assist Transfers: Sit to/from Stand, Bed to chair/wheelchair/BSC Sit to Stand: Max assist, Total assist   Squat pivot transfers: Max assist, Total assist       General transfer comment: EOB to chair without AD      Balance Overall balance assessment: Needs assistance Sitting-balance support: Feet supported, No upper extremity supported Sitting balance-Leahy Scale: Poor Sitting balance - Comments: Seated in chair and EOB Postural control: Posterior lean Standing balance support: During functional activity Standing balance-Leahy Scale: Poor Standing balance comment: With therapist assist.                           ADL either performed or assessed with clinical judgement   ADL Overall ADL's : Needs assistance/impaired Eating/Feeding: Maximal assistance;Sitting   Grooming: Maximal assistance;Sitting   Upper Body Bathing: Maximal assistance;Sitting   Lower Body Bathing: Total assistance;Maximal assistance;Bed level   Upper Body Dressing : Maximal assistance;Sitting   Lower Body Dressing: Maximal assistance;Total assistance;Bed level   Toilet  Transfer: Total assistance;Maximal assistance;Stand-pivot   Toileting- Clothing Manipulation and Hygiene: Total assistance;Bed level               Vision Baseline Vision/History: 1 Wears glasses Ability to See in Adequate  Light: 1 Impaired Patient Visual Report: Other (comment) (Pt unable to report on vision status.) Vision Assessment?: No apparent visual deficits     Perception Perception: Not tested       Praxis Praxis: Not tested       Pertinent Vitals/Pain Pain Assessment Pain Assessment: Faces Faces Pain Scale: No hurt     Extremity/Trunk Assessment Upper Extremity Assessment Upper Extremity Assessment: Difficult to assess due to impaired cognition;Generalized weakness (3-/5 MMT shoulder flexion bilaterally. Generally weak.)   Lower Extremity Assessment Lower Extremity Assessment: Defer to PT evaluation RLE Deficits / Details: Inc extensor tone of note in RLE, difficult to assess motor due to AMS/impaired cognition RLE Coordination: decreased gross motor   Cervical / Trunk Assessment Cervical / Trunk Assessment: Kyphotic   Communication Communication Communication: Impaired Factors Affecting Communication: Difficulty expressing self   Cognition Arousal: Alert Behavior During Therapy: WFL for tasks assessed/performed Cognition: Cognition impaired, No family/caregiver present to determine baseline             OT - Cognition Comments: Pt unable to coherently verbally communicate.                 Following commands: Impaired Following commands impaired: Follows one step commands inconsistently     Cueing  General Comments   Cueing Techniques: Verbal cues;Tactile cues;Visual cues;Gestural cues                 Home Living Family/patient expects to be discharged to:: Assisted living                             Home Equipment: Rolling Walker (2 wheels);Rollator (4 wheels);Cane - quad;Cane - single point;Hand held shower head;Shower seat - built in;Grab bars - tub/shower   Additional Comments: Per chart. Pt is from ALF.      Prior Functioning/Environment Prior Level of Function : Needs assist       Physical Assist : Mobility (physical);ADLs  (physical) Mobility (physical): Bed mobility;Transfers;Gait ADLs (physical): Bathing;Dressing;Toileting;IADLs Mobility Comments: Per previous entered chart info, One person assisted transfers to w/c, uses w/c for mobility, ambulates with assistance with HHPT ADLs Comments: Per previous entered chart info, Assisted by ALF staff    OT Problem List: Decreased strength;Decreased range of motion;Decreased activity tolerance;Impaired balance (sitting and/or standing);Decreased cognition;Decreased safety awareness;Decreased knowledge of use of DME or AE   OT Treatment/Interventions: Self-care/ADL training;Therapeutic exercise;DME and/or AE instruction;Energy conservation;Therapeutic activities;Cognitive remediation/compensation;Patient/family education;Balance training      OT Goals(Current goals can be found in the care plan section)   Acute Rehab OT Goals Patient Stated Goal: None stated OT Goal Formulation: Patient unable to participate in goal setting Time For Goal Achievement: 12/25/24 Potential to Achieve Goals: Fair   OT Frequency:  Min 2X/week    Co-evaluation PT/OT/SLP Co-Evaluation/Treatment: Yes Reason for Co-Treatment: To address functional/ADL transfers PT goals addressed during session: Mobility/safety with mobility OT goals addressed during session: ADL's and self-care                       End of Session Equipment Utilized During Treatment: Gait belt  Activity Tolerance: Patient tolerated treatment well Patient left: in chair;with call bell/phone within reach  OT Visit Diagnosis:  Unsteadiness on feet (R26.81);Other abnormalities of gait and mobility (R26.89);Muscle weakness (generalized) (M62.81);Cognitive communication deficit (R41.841)                Time: 0950-1002 OT Time Calculation (min): 12 min Charges:  OT General Charges $OT Visit: 1 Visit OT Evaluation $OT Eval Low Complexity: 1 Low  Floraine Buechler OT, MOT  Jayson Person 12/11/2024, 1:45 PM

## 2024-12-11 NOTE — Plan of Care (Signed)

## 2024-12-11 NOTE — Plan of Care (Signed)
°  Problem: Acute Rehab OT Goals (only OT should resolve) Goal: Pt. Will Perform Grooming Flowsheets (Taken 12/11/2024 1347) Pt Will Perform Grooming:  with set-up  sitting Goal: Pt. Will Perform Upper Body Dressing Flowsheets (Taken 12/11/2024 1347) Pt Will Perform Upper Body Dressing:  with set-up  sitting Goal: Pt. Will Transfer To Toilet Flowsheets (Taken 12/11/2024 1347) Pt Will Transfer to Toilet:  with mod assist  stand pivot transfer Goal: Pt. Will Perform Toileting-Clothing Manipulation Flowsheets (Taken 12/11/2024 1347) Pt Will Perform Toileting - Clothing Manipulation and hygiene:  with mod assist  bed level  with adaptive equipment Goal: Pt/Caregiver Will Perform Home Exercise Program Flowsheets (Taken 12/11/2024 1347) Pt/caregiver will Perform Home Exercise Program:  Increased ROM  Increased strength  Left upper extremity Right upper extremity  With Supervision  Faline Langer OT, MOT

## 2024-12-11 NOTE — TOC Initial Note (Signed)
 Transition of Care [facility]) - Initial/Assessment Note    Patient Details  Name: [name] White MRN: [id number] Date of Birth: December 18, 1941  [facility] CM/SW Contact:    Noreen KATHEE Pinal, LCSWA Phone Number: 12/11/2024, 11:15 AM  Clinical Narrative:                  CSW went to visit patient at bedside, daughter was not present. Patient was mumbling and not clear with her words. CSW called daughter to assess patient. Patient is a long-term resident at [facility] and been there since May. Daughter wants patient to return back to [facility] and have HHPT there.  She reports that they assist with all her ADL's needs, that she was walking some with PT, and that she is primary WC bound at facility. CSW did mention that PT was ordered  and it is likely that they would recommend SNF. Daughter expressed that PT should have not seen patient because of her weakness, dehydration, and would not do her best. Daughter does not want SNF right now. ICM will continue to follow.    Expected Discharge Plan: Assisted Living Barriers to Discharge: Continued Medical Work up   Patient Goals and CMS Choice Patient states their goals for this hospitalization and ongoing recovery are:: return back to [facility] CMS Medicare.gov Compare Post Acute Care list provided to:: Patient Represenative (must comment) (Daughter- Olam) Choice offered to / list presented to : Adult Children      Expected Discharge Plan and Services     Post Acute Care Choice: Durable Medical Equipment Living arrangements for the past 2 months: Assisted Living Facility                           HH Arranged: PT       Representative spoke with at [facility] Agency: At facility  Prior Living Arrangements/Services Living arrangements for the past 2 months: Assisted Living Facility Lives with:: Facility Resident Patient language and need for interpreter reviewed:: Yes Do you feel safe going back to the place where you live?: Yes       Need for Family Participation in Patient Care: Yes (Comment) Care giver support system in place?: Yes (comment) Current home services: DME Criminal Activity/Legal Involvement Pertinent to Current Situation/Hospitalization: No - Comment as needed  Activities of Daily Living   ADL Screening (condition at time of admission) Independently performs ADLs?: No Does the patient have a NEW difficulty with bathing/dressing/toileting/self-feeding that is expected to last >3 days?: No Does the patient have a NEW difficulty with getting in/out of bed, walking, or climbing stairs that is expected to last >3 days?: No Does the patient have a NEW difficulty with communication that is expected to last >3 days?: No  Permission Sought/Granted      Share Information with NAME: Olam     Permission granted to share info w Relationship: Daughter     Emotional Assessment Appearance:: Appears stated age Attitude/Demeanor/Rapport: Unable to Assess Affect (typically observed): Unable to Assess     Psych Involvement: No (comment)  Admission diagnosis:  Somnolence [R40.0] Acute metabolic encephalopathy [G93.41] Patient Active Problem List   Diagnosis Date Noted   Volume depletion 12/11/2024   Vascular dementia (HCC) 12/11/2024   Somnolence 12/11/2024   COPD with acute exacerbation (HCC) 11/10/2024   Lobar pneumonia 11/09/2024   Uncontrolled type 2 diabetes mellitus with hyperglycemia, with long-term current use of insulin  (HCC) 11/09/2024   CAP (community acquired  pneumonia) 10/11/2024   Abnormal gait 07/10/2024   Cervical radiculopathy 07/10/2024   Chronic neck pain 07/10/2024   Visual disturbance 07/10/2024   Recurrent UTI 07/09/2024   Sepsis due to undetermined organism (HCC) 05/22/2024   Hypomagnesemia 05/01/2024   Acute respiratory failure with hypoxemia (HCC) 04/24/2024   CVA (cerebral vascular accident) (HCC) 03/12/2024   Anxiety and depression 03/12/2024   GERD without esophagitis  03/12/2024   Rhabdomyolysis 12/27/2023   Pressure injury of skin 12/27/2023   Acute metabolic encephalopathy 12/26/2023   Status post transsphenoidal pituitary resection 06/04/2020   Pituitary adenoma with extrasellar extension (HCC) 06/04/2020   Pituitary tumor 05/29/2020   Dizzinesses 02/02/2020   Vitamin D  deficiency 01/28/2020   Paroxysmal supraventricular tachycardia 06/19/2014   Rectocele 03/19/2013   Constipation 03/19/2013   HYPERCHOLESTEROLEMIA 02/09/2009   Essential hypertension 02/09/2009   Osteoporosis 02/09/2009   Post-menopausal 02/09/2009   HEAT INTOLERANCE 02/01/2008   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   Hypothyroidism 09/14/2007   Migraine headache 09/14/2007   SUPERFICIAL PHLEBITIS 09/14/2007   MENOPAUSAL SYNDROME 09/14/2007   FATIGUE 09/14/2007   Type 2 diabetes mellitus with peripheral neuropathy (HCC) 09/14/2007   PCP:  [name], MD Pharmacy:   [facility], [place] - [address] [place] [address] Phone: [phone] Fax: [phone]  [facility] - [place], [place] - [address], [address] [place] [address] Phone: [phone] Fax: [phone]     Social Drivers of Health (SDOH) Social History: SDOH Screenings   Food Insecurity: No Food Insecurity (11/08/2024)  Housing: Low Risk (11/09/2024)  Transportation Needs: No Transportation Needs (11/08/2024)  Utilities: Not At Risk (11/08/2024)  Depression (PHQ2-9): Low Risk (10/28/2024)  Social Connections: Socially Isolated (11/08/2024)  Tobacco Use: Low Risk (12/10/2024)   SDOH Interventions:     Readmission Risk Interventions    11/09/2024    9:13 AM 10/12/2024   10:32 AM 05/23/2024   10:18 AM  Readmission Risk Prevention Plan  Transportation Screening Complete Complete Complete  HRI or Home Care Consult  Complete Complete  Social Work Consult for Recovery Care Planning/Counseling  Complete Complete  Palliative  Care Screening  Not Applicable Not Applicable  Medication Review Oceanographer) Complete Complete Complete  HRI or Home Care Consult Complete    SW Recovery Care/Counseling Consult Complete    Palliative Care Screening Not Applicable    Skilled Nursing Facility Not Applicable

## 2024-12-11 NOTE — Plan of Care (Signed)
°  Problem: Acute Rehab PT Goals(only PT should resolve) Goal: Pt Will Go Supine/Side To Sit Outcome: Progressing Flowsheets (Taken 12/11/2024 1309) Pt will go Supine/Side to Sit: with minimal assist Goal: Patient Will Transfer Sit To/From Stand Outcome: Progressing Flowsheets (Taken 12/11/2024 1309) Patient will transfer sit to/from stand: with moderate assist Goal: Pt Will Transfer Bed To Chair/Chair To Bed Outcome: Progressing Flowsheets (Taken 12/11/2024 1309) Pt will Transfer Bed to Chair/Chair to Bed: with mod assist Goal: Pt Will Ambulate Outcome: Progressing Flowsheets (Taken 12/11/2024 1309) Pt will Ambulate:  with moderate assist  with rolling walker  10 feet   1:10 PM, 12/11/2024 Rosaria Settler, PT, DPT [place] with [facility]

## 2024-12-11 NOTE — Evaluation (Signed)
 Physical Therapy Evaluation Patient Details Name: [name] White MRN: [id number] DOB: 04/23/1941 Today's Date: 12/11/2024  History of Present Illness  [name] White is a 83 y.o. female with medical history significant of anemia, anxiety, asthma, depression, type 2 diabetes, hyperlipidemia, hypothyroidism, recent residence in a skilled nursing facility brought into the emergency room today with complaints of altered mental status.  According to patient's daughter who is present at bedside they were able to engage in conversation with her about 4 days ago when they visited her at the nursing home.  Today patient is not able to engage in any meaningful conversation with them and they believe she is off from her baseline.  Patient denied abdominal pain headache, nausea or vomiting.  She has not been having good oral intake.   Clinical Impression  Patient appeared agreeable to OT/PT co-evaluation. Patient is with AMS and unable to respond appropriately verbally throughout session. Per documentation in chart, patient is from rehab facility, and receives assist with ADLs. Unsure of patient most recent mobility status but previously pt required some assist for transfer, ambulation, and using w/c. This date, patient is overall max assist with all transfers. Patient tolerates sitting in chair at end of session, all needs met, and call button in reach. Patient will benefit from continued skilled physical therapy acutely and in recommended venue in order to address current deficits In order to improve overall function.      If plan is discharge home, recommend the following: A lot of help with walking and/or transfers;A lot of help with bathing/dressing/bathroom;Assistance with feeding;Assist for transportation;Supervision due to cognitive status;Help with stairs or ramp for entrance   Can travel by private vehicle   No    Equipment Recommendations None recommended by PT  Recommendations for Other  Services       Functional Status Assessment Patient has had a recent decline in their functional status and demonstrates the ability to make significant improvements in function in a reasonable and predictable amount of time.     Precautions / Restrictions Precautions Precautions: Fall Recall of Precautions/Restrictions: Impaired Restrictions Weight Bearing Restrictions Per Provider Order: No      Mobility  Bed Mobility Overal bed mobility: Needs Assistance Bed Mobility: Supine to Sit, Sit to Supine     Supine to sit: Mod assist, Max assist Sit to supine: Max assist   General bed mobility comments: HOB flat, pt recived sitting in chair, required max A for sit to supine, more mod/max for supine to sit as therapist begins transfer for pt but pt able to push through R elbow and L hand to raise trunk the remaining half of transfer while lowering BLE, altered cognition being a contributing factor    Transfers Overall transfer level: Needs assistance Equipment used: 1 person hand held assist Transfers: Sit to/from Stand, Bed to chair/wheelchair/BSC Sit to Stand: Max assist     Squat pivot transfers: Max assist     General transfer comment: Difficulty with transfer due to general weakness, requires max assist x1 via squat pivot, pt with little active assist, altered cognition being a contributing factor    Ambulation/Gait               General Gait Details: Unable to safely assess this date  Stairs            Wheelchair Mobility     Tilt Bed    Modified Rankin (Stroke Patients Only)       Balance Overall balance assessment:  Needs assistance Sitting-balance support: Feet supported, No upper extremity supported Sitting balance-Leahy Scale: Poor Sitting balance - Comments: Seated in chair and EOB Postural control: Posterior lean Standing balance support: During functional activity Standing balance-Leahy Scale: Poor Standing balance comment: w/ no AD and  max Assist                             Pertinent Vitals/Pain Pain Assessment Pain Assessment: Faces Faces Pain Scale: No hurt    Home Living Family/patient expects to be discharged to:: Skilled nursing facility                   Additional Comments: Per chart, patient from SNF. Pt unable to provide any history due to AMS and unintelligible  speech. no family present at time of evaluation    Prior Function Prior Level of Function : Needs assist       Physical Assist : Mobility (physical);ADLs (physical) Mobility (physical): Bed mobility;Transfers;Gait ADLs (physical): Bathing;Dressing;Toileting;IADLs Mobility Comments: Per previous entered chart info, One person assisted transfers to w/c, uses w/c for mobility, ambulates with assistance with HHPT ADLs Comments: Per previous entered chart info, Assisted by ALF staff     Extremity/Trunk Assessment   Upper Extremity Assessment Upper Extremity Assessment: Defer to OT evaluation    Lower Extremity Assessment Lower Extremity Assessment: Generalized weakness;RLE deficits/detail (pt generally frail and weak throughout, requiring assist for all mobility, pt able to perform active DF/PF bilaterally with visual cueing, unable to perform other LE motions due to AMS/distracted) RLE Deficits / Details: Inc extensor tone of note in RLE, difficult to assess motor due to AMS/impaired cognition RLE Coordination: decreased gross motor    Cervical / Trunk Assessment Cervical / Trunk Assessment: Kyphotic  Communication   Communication Communication: Impaired Factors Affecting Communication: Difficulty expressing self    Cognition Arousal: Alert Behavior During Therapy: WFL for tasks assessed/performed   PT - Cognitive impairments: History of cognitive impairments, No family/caregiver present to determine baseline, Difficult to assess Difficult to assess due to: Impaired communication                        Following commands: Impaired Following commands impaired: Follows one step commands inconsistently     Cueing Cueing Techniques: Verbal cues, Tactile cues, Visual cues, Gestural cues     General Comments      Exercises     Assessment/Plan    PT Assessment Patient needs continued PT services;All further PT needs can be met in the next venue of care  PT Problem List Decreased strength;Decreased range of motion;Decreased activity tolerance;Decreased balance;Decreased mobility       PT Treatment Interventions DME instruction;Balance training;Gait training;Functional mobility training;Therapeutic activities;Therapeutic exercise;Patient/family education    PT Goals (Current goals can be found in the Care Plan section)  Acute Rehab PT Goals Patient Stated Goal: return to SNF PT Goal Formulation: With patient Time For Goal Achievement: 12/13/24 Potential to Achieve Goals: Good    Frequency Min 3X/week     Co-evaluation PT/OT/SLP Co-Evaluation/Treatment: Yes Reason for Co-Treatment: To address functional/ADL transfers PT goals addressed during session: Mobility/safety with mobility         AM-PAC PT 6 Clicks Mobility  Outcome Measure Help needed turning from your back to your side while in a flat bed without using bedrails?: A Lot Help needed moving from lying on your back to sitting on the side of a flat bed without  using bedrails?: A Lot Help needed moving to and from a bed to a chair (including a wheelchair)?: A Lot Help needed standing up from a chair using your arms (e.g., wheelchair or bedside chair)?: A Lot Help needed to walk in hospital room?: Total Help needed climbing 3-5 steps with a railing? : Total 6 Click Score: 10    End of Session Equipment Utilized During Treatment: Gait belt Activity Tolerance: Patient tolerated treatment well;Patient limited by fatigue Patient left: in chair;with call bell/phone within reach;with chair alarm set   PT Visit  Diagnosis: Other abnormalities of gait and mobility (R26.89);Muscle weakness (generalized) (M62.81);Adult, failure to thrive (R62.7)    Time: 0951-1001 PT Time Calculation (min) (ACUTE ONLY): 10 min   Charges:   PT Evaluation $PT Eval Low Complexity: 1 Low   PT General Charges $$ ACUTE PT VISIT: 1 Visit       1:08 PM, 12/11/2024 Adja Ruff Powell-Butler, PT, DPT [place] with [facility]

## 2024-12-11 NOTE — Hospital Course (Signed)
 83 y.o. female with medical history significant of anemia, anxiety, asthma, depression, type 2 diabetes, hyperlipidemia, hypothyroidism, recent residence in a skilled nursing facility brought into the emergency room today with complaints of altered mental status.    Assessment and Plan:   Acute metabolic encephalopathy - Patient not oriented, encephalopathic appearing.  Etiology unclear but likely multifactorial with possible underlying vascular dementia, dehydration, hospital delirium.  Does not appear to be any acute infectious source, UA negative, chest x-ray clear, no fever or other symptoms.  IV fluids on board.  Continue to attempt to orient.  Delirium precautions.  Continue to monitor closely.   Dehydration/volume depletion - Decreased p.o. intake, likely contributing.  IV fluid hydration on board, NS at 75 mL/h.  Monitor urine output and recheck BMP in AM.   Anemia of chronic disease - No active bleeding appreciated.  Monitor CBC in AM.   Diabetes mellitus - Insulin  sliding scale on board.   Chronic asthma - Does not appear to be acute exacerbation.  Nebulizers as needed.   Hyperlipidemia/hypothyroidism - Resume home medication regiment.   Physical debilitation muscle weakness - Patient very frail, recent residence in SNF.  Once less encephalopathic, will have PT/OT evaluate.  Likely return to SNF.

## 2024-12-11 NOTE — Progress Notes (Signed)
 Pt has not had any output since arrival to floor, pt was bladder scanned this am.   Bladder scan: 

## 2024-12-12 DIAGNOSIS — G9341 Metabolic encephalopathy: Secondary | ICD-10-CM | POA: Diagnosis not present

## 2024-12-12 DIAGNOSIS — R197 Diarrhea, unspecified: Secondary | ICD-10-CM

## 2024-12-12 DIAGNOSIS — E869 Volume depletion, unspecified: Secondary | ICD-10-CM | POA: Diagnosis not present

## 2024-12-12 DIAGNOSIS — F01518 Vascular dementia, unspecified severity, with other behavioral disturbance: Secondary | ICD-10-CM | POA: Diagnosis not present

## 2024-12-12 LAB — COMPREHENSIVE METABOLIC PANEL WITH GFR
ALT: 22 U/L (ref 0–44)
AST: 26 U/L (ref 15–41)
Albumin: 3.2 g/dL — ABNORMAL LOW (ref 3.5–5.0)
Alkaline Phosphatase: 50 U/L (ref 38–126)
Anion gap: 8 (ref 5–15)
BUN: 9 mg/dL (ref 8–23)
CO2: 25 mmol/L (ref 22–32)
Calcium: 8.2 mg/dL — ABNORMAL LOW (ref 8.9–10.3)
Chloride: 100 mmol/L (ref 98–111)
Creatinine, Ser: 0.53 mg/dL (ref 0.44–1.00)
GFR, Estimated: >60 mL/min (ref >60)
Glucose, Bld: 113 mg/dL — ABNORMAL HIGH (ref 70–99)
Potassium: 3.3 mmol/L — ABNORMAL LOW (ref 3.5–5.1)
Sodium: 134 mmol/L — ABNORMAL LOW (ref 135–145)
Total Bilirubin: 1.1 mg/dL (ref 0.0–1.2)
Total Protein: 5.9 g/dL — ABNORMAL LOW (ref 6.5–8.1)

## 2024-12-12 LAB — CBC
HCT: 32.4 % — ABNORMAL LOW (ref 36.0–46.0)
Hemoglobin: 10.5 g/dL — ABNORMAL LOW (ref 12.0–15.0)
MCH: 28.9 pg (ref 26.0–34.0)
MCHC: 32.4 g/dL (ref 30.0–36.0)
MCV: 89.3 fL (ref 80.0–100.0)
Platelets: 226 K/uL (ref 150–400)
RBC: 3.63 MIL/uL — ABNORMAL LOW (ref 3.87–5.11)
RDW: 16.9 % — ABNORMAL HIGH (ref 11.5–15.5)
WBC: 11.2 K/uL — ABNORMAL HIGH (ref 4.0–10.5)
nRBC: 0 % (ref 0.0–0.2)

## 2024-12-12 LAB — C DIFFICILE QUICK SCREEN W PCR REFLEX
C Diff antigen: NEGATIVE
C Diff interpretation: NOT DETECTED
C Diff toxin: NEGATIVE

## 2024-12-12 LAB — PHOSPHORUS: Phosphorus: 2.2 mg/dL — ABNORMAL LOW (ref 2.5–4.6)

## 2024-12-12 LAB — GLUCOSE, CAPILLARY
Glucose-Capillary: 118 mg/dL — ABNORMAL HIGH (ref 70–99)
Glucose-Capillary: 154 mg/dL — ABNORMAL HIGH (ref 70–99)
Glucose-Capillary: 144 mg/dL — ABNORMAL HIGH (ref 70–99)
Glucose-Capillary: 173 mg/dL — ABNORMAL HIGH (ref 70–99)

## 2024-12-12 LAB — CK: Total CK: 129 U/L (ref 38–234)

## 2024-12-12 LAB — MAGNESIUM: Magnesium: 1.5 mg/dL — ABNORMAL LOW (ref 1.7–2.4)

## 2024-12-12 MED ORDER — ENSURE PLUS HIGH PROTEIN PO LIQD
237.0000 mL | Freq: Two times a day (BID) | ORAL | Status: DC
  Administered 2024-12-12 – 2024-12-16 (×8): 237 mL via ORAL

## 2024-12-12 MED ORDER — LACTATED RINGERS IV SOLN: INTRAVENOUS | Status: DC

## 2024-12-12 MED ORDER — NITROFURANTOIN MACROCRYSTAL 50 MG PO CAPS
50.0000 mg | ORAL_CAPSULE | Freq: Every day | ORAL | Status: DC
  Administered 2024-12-12 – 2024-12-15 (×4): 50 mg via ORAL
  Filled 2024-12-12 (×5): qty 1

## 2024-12-12 MED ORDER — PREGABALIN 75 MG PO CAPS
150.0000 mg | ORAL_CAPSULE | Freq: Two times a day (BID) | ORAL | Status: DC
  Administered 2024-12-12 – 2024-12-16 (×9): 150 mg via ORAL
  Filled 2024-12-12 (×9): qty 2

## 2024-12-12 MED ORDER — CYCLOSPORINE 0.05 % OP EMUL
1.0000 [drp] | Freq: Two times a day (BID) | OPHTHALMIC | Status: DC
  Administered 2024-12-12 – 2024-12-16 (×8): 1 [drp] via OPHTHALMIC
  Filled 2024-12-12 (×8): qty 30

## 2024-12-12 MED ORDER — VENLAFAXINE HCL ER 75 MG PO CP24
150.0000 mg | ORAL_CAPSULE | ORAL | Status: DC
  Administered 2024-12-12 – 2024-12-16 (×5): 150 mg via ORAL
  Filled 2024-12-12 (×5): qty 2

## 2024-12-12 MED ORDER — VORTIOXETINE HBR 10 MG PO TABS
10.0000 mg | ORAL_TABLET | Freq: Every day | ORAL | Status: DC
  Administered 2024-12-12 – 2024-12-16 (×5): 10 mg via ORAL
  Filled 2024-12-12 (×6): qty 1

## 2024-12-12 MED ORDER — KCL IN DEXTROSE-NACL 20-5-0.45 MEQ/L-%-% IV SOLN
INTRAVENOUS | Status: AC
  Filled 2024-12-12: qty 1000

## 2024-12-12 MED ORDER — SODIUM CHLORIDE 0.9 % IV BOLUS
1000.0000 mL | Freq: Once | INTRAVENOUS | Status: AC
  Administered 2024-12-12: 12:00:00 1000 mL via INTRAVENOUS

## 2024-12-12 MED ORDER — MIDODRINE HCL 5 MG PO TABS
5.0000 mg | ORAL_TABLET | Freq: Three times a day (TID) | ORAL | Status: DC
  Administered 2024-12-12 – 2024-12-16 (×12): 5 mg via ORAL
  Filled 2024-12-12 (×12): qty 1

## 2024-12-12 NOTE — TOC Progression Note (Signed)
 Transition of Care [facility]) - Progression Note    Patient Details  Name: [name] White MRN: [id number] Date of Birth: 1941-09-29  [facility]) CM/SW Contact  Noreen KATHEE [place], [place] Phone Number: 12/12/2024, 1:32 PM  Clinical Narrative:     CSW spoke with daughter regarding PT recommendation for SNF. Daughter expressed that patient just got her medication after two days and feel that after patient receives her medications, she will do better participating with therapy. Daughter did give CSW permission to start SNF process , but only wanted referral sent to [facility] and [facility]. Referral process started. ICM will continue to follow.    Barriers to Discharge: Continued Medical Work up    Expected Discharge Plan and Services     Post Acute Care Choice: Durable Medical Equipment Living arrangements for the past 2 months: Assisted Living Facility                           HH Arranged: PT       Representative spoke with at [facility] Agency: At facility   Social Drivers of Health (SDOH) Interventions SDOH Screenings   Food Insecurity: Patient Unable To Answer (12/12/2024)  Housing: Patient Unable To Answer (12/12/2024)  Transportation Needs: Patient Unable To Answer (12/12/2024)  Utilities: Patient Unable To Answer (12/12/2024)  Depression (PHQ2-9): Low Risk (10/28/2024)  Social Connections: Patient Unable To Answer (12/12/2024)  Recent Concern: Social Connections - Socially Isolated (11/08/2024)  Tobacco Use: Low Risk (12/10/2024)    Readmission Risk Interventions    12/12/2024    1:31 PM 11/09/2024    9:13 AM 10/12/2024   10:32 AM  Readmission Risk Prevention Plan  Transportation Screening Complete Complete Complete  HRI or Home Care Consult   Complete  Social Work Consult for Recovery Care Planning/Counseling   Complete  Palliative Care Screening   Not Applicable  Medication Review [employer]) Complete Complete Complete  HRI or Home Care Consult Complete  Complete   SW Recovery Care/Counseling Consult Complete Complete   Palliative Care Screening Not Applicable Not Applicable   Skilled Nursing Facility -- Not Applicable

## 2024-12-12 NOTE — Progress Notes (Signed)
 Progress Note   Patient: [name] White FMW:[id number] DOB: 1941-10-18 DOA: 12/10/2024  DOS: the patient was seen and examined on 12/12/2024   Brief hospital course:  83 y.o. female with medical history significant of anemia, anxiety, asthma, depression, type 2 diabetes, hyperlipidemia, hypothyroidism, recent residence in a skilled nursing facility brought into the emergency room today with complaints of altered mental status.    Assessment and Plan:   Acute metabolic encephalopathy - Patient not oriented, encephalopathic appearing.  Etiology unclear but likely multifactorial with possible underlying vascular dementia, dehydration, hospital delirium, acute diarrhea.  Does not appear to be any acute infectious source, UA negative, chest x-ray clear, no fever, but does have diarrhea.  C. difficile negative but GI panel pending.  IV fluids on board.  Continue to attempt to orient.  Delirium precautions.  Continue to monitor closely.  Acute diarrhea - Noted on multiple bowel movements yesterday.  No fever, bloody bowel movements.  C. difficile negative.  GI panel ordered.  Will order IV fluid bolus today, transition IV fluids to D5 half-normal with 20 of K at 100 mL/h.   Dehydration/volume depletion - Decreased p.o. intake, likely contributing.  Diarrhea noted yesterday as well.  IV fluids as above.  Monitor urine output and recheck BMP in AM.   Anemia of chronic disease - No active bleeding appreciated.  Monitor CBC in AM.   Diabetes mellitus - Insulin  sliding scale on board.   Chronic asthma - Does not appear to be acute exacerbation.  Nebulizers as needed.   Hyperlipidemia/hypothyroidism/neuropathy - Ordered home medication regiment to begin today.  Hypertension - Antihypertensives held for now.  Monitor blood pressure closely.   Physical debilitation muscle weakness - Patient very frail, recent residence in SNF.  Once less encephalopathic, will have PT/OT evaluate.  Likely return  to SNF.   Subjective: Patient in the bedside chair, still encephalopathic.  Alert but mostly nonverbal.  Seems to respond to yes or no questions but otherwise not at her baseline.  Family at bedside corroborating this is not her normal mental status.  Informed of multiple bowel movements yesterday but no noted vomiting, pain, shortness of breath, fever.  Family concerned about restarting her home medications as they feel it can contribute to her encephalopathy if she is not on schedule.  Physical Exam:  Vitals:   12/11/24 1306 12/11/24 1955 12/12/24 0317 12/12/24 0844  BP: (!) 118/96 102/76 (!) 98/54   Pulse: 99 98 89   Resp: 18     Temp: 97.9 F (36.6 C) 97.8 F (36.6 C) 97.8 F (36.6 C)   TempSrc: Oral Oral Oral   SpO2: 96% 94% 94% 94%  Weight:      Height:        GENERAL: Encephalopathic, frail HEENT:  EOMI CARDIOVASCULAR:  RRR, no murmurs appreciated RESPIRATORY:  Clear to auscultation, no wheezing, rales, or rhonchi GASTROINTESTINAL:  Soft, nontender, nondistended EXTREMITIES: Thin, no LE edema bilaterally NEURO: Moves all 4 extremities, not oriented SKIN: Scattered ecchymosis PSYCH: Encephalopathic   Data Reviewed:  Imaging Studies: MR BRAIN WO CONTRAST Result Date: 12/10/2024 EXAM: MRI BRAIN WITHOUT CONTRAST 12/10/2024 05:50:24 PM TECHNIQUE: Multiplanar multisequence MRI of the head/brain was performed without the administration of intravenous contrast. COMPARISON: Comparison made with CT from earlier the same day, as well as prior exam from 10/25/2024. CLINICAL HISTORY: Mental status change, unknown cause. FINDINGS: BRAIN AND VENTRICLES: No acute infarct. No intracranial hemorrhage. No mass. No midline shift. No hydrocephalus. Generalized age-related cerebral atrophy. Patchy and  confluent T2/FLAIR hyperintensity involving the periventricular and deep white matter of the hemispheres, consistent with chronic small vessel ischemic disease, moderate in nature. Multiple  remote lacunar infarcts present about the hemispheric cerebral white matter. Scattered chronic remote bilateral cerebellar infarcts noted. Previously identified pituitary macroadenoma measures up to approximately 2.4 cm, relatively stable from prior. Normal flow voids. ORBITS: Prior bilateral ocular lens replacement. SINUSES AND MASTOIDS: No acute abnormality. BONES AND SOFT TISSUES: Normal marrow signal. No acute soft tissue abnormality. IMPRESSION: 1. No acute intracranial abnormality. 2. Moderate chronic small vessel ischemic disease with multiple remote lacunar infarcts about the hemispheric cerebral white matter and cerebellum. 3. 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 Electronically signed by: Morene Hoard MD 12/10/2024 06:34 PM EST RP Workstation: [id number]   CT Head Wo Contrast Result Date: 12/10/2024 EXAM: CT HEAD WITHOUT CONTRAST 12/10/2024 03:32:44 PM TECHNIQUE: CT of the head was performed without the administration of intravenous contrast. Automated exposure control, iterative reconstruction, and/or weight based adjustment of the mA/kV was utilized to reduce the radiation dose to as low as reasonably achievable. COMPARISON: 10/12/2024 CLINICAL HISTORY: Altered mental status, nontraumatic (Ped 0-17y) FINDINGS: BRAIN AND VENTRICLES: No acute hemorrhage. No evidence of acute infarct. Global parenchymal atrophy with prominent CSF spaces and sulci. Periventricular and subcortical white matter patchy hypodensities, consistent with chronic small-vessel ischemic changes. Old right cerebellar infarct. Additional small remote left cerebellar infarct. Postsurgical changes of prior transsphenoidal resection of pituitary tumor. There is residual slightly hyperattenuating soft tissue along the superior aspect of the sella extending to the suprasellar cistern, better evaluated on recent prior MRI. Postsurgical changes of the sella. No hydrocephalus. No extra-axial collection.  No mass effect or midline shift. ORBITS: Bilateral lens replacement. Vascular calcifications. No acute abnormality. SINUSES: No acute abnormality. SOFT TISSUES AND SKULL: No acute soft tissue abnormality. No skull fracture. IMPRESSION: 1. No acute intracranial abnormality. 2. Global parenchymal atrophy, chronic small-vessel ischemic changes, and remote cerebellar infarcts. 3. Postsurgical changes of transsphenoidal resection of pituitary tumor with residual mass in the sella and suprasellar cistern, better evaluated on recent prior MRI. Electronically signed by: Donnice Mania MD 12/10/2024 04:01 PM EST RP Workstation: [id number]   DG Chest Portable 1 View Result Date: 12/10/2024 CLINICAL DATA:  Altered mental status EXAM: PORTABLE CHEST 1 VIEW COMPARISON:  November 08, 2024 FINDINGS: Stable cardiomediastinal silhouette. No definite acute pulmonary abnormality seen. The visualized skeletal structures are unremarkable. IMPRESSION: No active disease. Electronically Signed   By: Lynwood Landy Raddle M.D.   On: 12/10/2024 14:26    Results are pending, will review when available.  Previous records (including but not limited to H&P, progress notes, nursing notes, TOC management) were reviewed in assessment of this patient.  Labs: CBC: Recent Labs  Lab 12/10/24 1409 12/11/24 0504 12/12/24 0515  WBC 13.9* 11.2* 11.2*  NEUTROABS 9.9*  --   --   HGB 11.2* 10.8* 10.5*  HCT 34.2* 32.8* 32.4*  MCV 89.1 89.1 89.3  PLT 245 248 226   Basic Metabolic Panel: Recent Labs  Lab 12/10/24 1409 12/11/24 0504 12/12/24 0515  NA 135 139 134*  K 4.7 3.8 3.3*  CL 98 103 100  CO2 23 23 25   GLUCOSE 176* 98 113*  BUN 15 13 9   CREATININE 0.59 0.51 0.53  CALCIUM  8.5* 8.5* 8.2*  MG  --   --  1.5*  PHOS  --   --  2.2*   Liver Function Tests: Recent Labs  Lab 12/10/24 1409 12/12/24 0515  AST  28 26  ALT 25 22  ALKPHOS 42 50  BILITOT 1.0 1.1  PROT 6.4* 5.9*  ALBUMIN 3.9 3.2*   CBG: Recent Labs  Lab  12/11/24 0738 12/11/24 1252 12/11/24 1637 12/11/24 2106 12/12/24 0749  GLUCAP 72 126* 160* 126* 118*    Scheduled Meds:  aspirin  EC  81 mg Oral Daily   atorvastatin   80 mg Oral Daily   cycloSPORINE   1 drop Both Eyes BID   enoxaparin  (LOVENOX ) injection  40 mg Subcutaneous Q24H   fluticasone  furoate-vilanterol  1 puff Inhalation Daily   insulin  aspart  0-9 Units Subcutaneous TID WC   levothyroxine   50 mcg Oral Q0600   midodrine   5 mg Oral TID WC   nitrofurantoin   50 mg Oral QHS   pantoprazole   40 mg Oral Daily   pregabalin   150 mg Oral BID   venlafaxine  XR  150 mg Oral BH-q7a   vortioxetine  HBr  10 mg Oral Daily   Continuous Infusions:  dextrose  5 % and 0.45 % NaCl with KCl 20 mEq/L     sodium chloride      PRN Meds:.acetaminophen , albuterol , busPIRone , polyethylene glycol  Family Communication: At bedside  Disposition: Status is: Inpatient Remains inpatient appropriate because: Metabolic cephalopathy, diarrhea     Time spent: 40 minutes  Length of inpatient stay: 1 days  Author: Carliss LELON Canales, DO 12/12/2024 10:50 AM  For on call review [email or web address].

## 2024-12-12 NOTE — NC FL2 (Signed)
 [place]  MEDICAID FL2 LEVEL OF CARE FORM     IDENTIFICATION  Patient Name: [name] White Birthdate: 01/06/1941 Sex: female Admission Date (Current Location): 12/10/2024  [facility] and [place] Number:  [employer] and Address:  [facility],  [address], [place] [address]      Provider Number: [id number]  Attending Physician Name and Address:  Arlon Carliss ORN, DO  Relative Name and Phone Number:  [name] [phone]    Current Level of Care: Hospital Recommended Level of Care: Skilled Nursing Facility Prior Approval Number:    Date Approved/Denied:   PASRR Number: [id number]  Discharge Plan: SNF    Current Diagnoses: Patient Active Problem List   Diagnosis Date Noted   Volume depletion 12/11/2024   Vascular dementia (HCC) 12/11/2024   Somnolence 12/11/2024   COPD with acute exacerbation (HCC) 11/10/2024   Lobar pneumonia 11/09/2024   Uncontrolled type 2 diabetes mellitus with hyperglycemia, with long-term current use of insulin  (HCC) 11/09/2024   CAP (community acquired pneumonia) 10/11/2024   Abnormal gait 07/10/2024   Cervical radiculopathy 07/10/2024   Chronic neck pain 07/10/2024   Visual disturbance 07/10/2024   Recurrent UTI 07/09/2024   Sepsis due to undetermined organism (HCC) 05/22/2024   Hypomagnesemia 05/01/2024   Acute respiratory failure with hypoxemia (HCC) 04/24/2024   CVA (cerebral vascular accident) (HCC) 03/12/2024   Anxiety and depression 03/12/2024   GERD without esophagitis 03/12/2024   Rhabdomyolysis 12/27/2023   Pressure injury of skin 12/27/2023   Acute metabolic encephalopathy 12/26/2023   Status post transsphenoidal pituitary resection 06/04/2020   Pituitary adenoma with extrasellar extension (HCC) 06/04/2020   Pituitary tumor 05/29/2020   Dizzinesses 02/02/2020   Vitamin D  deficiency 01/28/2020   Paroxysmal supraventricular tachycardia 06/19/2014   Rectocele 03/19/2013   Constipation 03/19/2013    HYPERCHOLESTEROLEMIA 02/09/2009   Essential hypertension 02/09/2009   Osteoporosis 02/09/2009   Post-menopausal 02/09/2009   HEAT INTOLERANCE 02/01/2008   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   Hypothyroidism 09/14/2007   Migraine headache 09/14/2007   SUPERFICIAL PHLEBITIS 09/14/2007   MENOPAUSAL SYNDROME 09/14/2007   FATIGUE 09/14/2007   Type 2 diabetes mellitus with peripheral neuropathy (HCC) 09/14/2007    Orientation RESPIRATION BLADDER Height & Weight     Self  Normal Incontinent Weight: 131 lb 9.8 oz (59.7 kg) Height:  5' 7 (170.2 cm)  BEHAVIORAL SYMPTOMS/MOOD NEUROLOGICAL BOWEL NUTRITION STATUS      Incontinent Diet (Heart)  AMBULATORY STATUS COMMUNICATION OF NEEDS Skin   Extensive Assist Verbally Normal                       Personal Care Assistance Level of Assistance  Bathing, Feeding, Dressing Bathing Assistance: Maximum assistance Feeding assistance: Independent Dressing Assistance: Maximum assistance     Functional Limitations Info  Sight, Hearing, Speech Sight Info: Adequate Hearing Info: Adequate Speech Info: Adequate    SPECIAL CARE FACTORS FREQUENCY  PT (By licensed PT)     PT Frequency: 5 x a week              Contractures Contractures Info: Not present    Additional Factors Info  Code Status, Allergies Code Status Info: DNR-Limited Allergies Info: Betadine (Povidone Iodine), Povidone-iodine, Penicillins           Current Medications (12/12/2024):  This is the current hospital active medication list Current Facility-Administered Medications  Medication Dose Route Frequency Provider Last Rate Last Admin   acetaminophen  (TYLENOL ) tablet 650 mg  650 mg Oral Q6H  PRN Djan, Prince T, MD   650 mg at 12/12/24 1220   albuterol  (PROVENTIL ) (2.5 MG/3ML) 0.083% nebulizer solution 2.5 mg  2.5 mg Nebulization Q4H PRN Dorinda Drue DASEN, MD       aspirin  EC tablet 81 mg  81 mg Oral Daily Djan, Prince T, MD   81 mg at 12/12/24 1219   atorvastatin   (LIPITOR) tablet 80 mg  80 mg Oral Daily Djan, Prince T, MD   80 mg at 12/12/24 1219   busPIRone  (BUSPAR ) tablet 5 mg  5 mg Oral TID PRN Dorinda Drue DASEN, MD   5 mg at 12/12/24 1219   cycloSPORINE  (RESTASIS ) 0.05 % ophthalmic emulsion 1 drop  1 drop Both Eyes BID Arlon Honey W, DO       dextrose  5 % and 0.45 % NaCl with KCl 20 mEq/L infusion   Intravenous Continuous Arlon Honey W, DO       enoxaparin  (LOVENOX ) injection 40 mg  40 mg Subcutaneous Q24H Dorinda Drue T, MD   40 mg at 12/12/24 1221   fluticasone  furoate-vilanterol (BREO ELLIPTA ) 200-25 MCG/ACT 1 puff  1 puff Inhalation Daily Dorinda Drue DASEN, MD   1 puff at 12/12/24 0841   insulin  aspart (novoLOG ) injection 0-9 Units  0-9 Units Subcutaneous TID WC Dorinda Drue DASEN, MD   3 Units at 12/11/24 1702   levothyroxine  (SYNTHROID ) tablet 50 mcg  50 mcg Oral Q0600 Djan, Prince T, MD   50 mcg at 12/12/24 0601   midodrine  (PROAMATINE ) tablet 5 mg  5 mg Oral TID WC Arlon Honey ORN, DO   5 mg at 12/12/24 1219   nitrofurantoin  (MACRODANTIN ) capsule 50 mg  50 mg Oral QHS Arlon Honey W, DO       pantoprazole  (PROTONIX ) EC tablet 40 mg  40 mg Oral Daily Djan, Prince T, MD   40 mg at 12/12/24 1220   polyethylene glycol (MIRALAX  / GLYCOLAX ) packet 17 g  17 g Oral Daily PRN Dorinda Drue DASEN, MD       pregabalin  (LYRICA ) capsule 150 mg  150 mg Oral BID Arlon Honey W, DO   150 mg at 12/12/24 1220   venlafaxine  XR (EFFEXOR -XR) 24 hr capsule 150 mg  150 mg Oral BH-q7a Calkins, Derek W, DO   150 mg at 12/12/24 1220   vortioxetine  HBr (TRINTELLIX ) tablet 10 mg  10 mg Oral Daily Arlon Honey ORN, DO         Discharge Medications: Please see discharge summary for a list of discharge medications.  Relevant Imaging Results:  Relevant Lab Results:   Additional Information SSN: [id number] [place]

## 2024-12-12 NOTE — Plan of Care (Signed)

## 2024-12-13 DIAGNOSIS — G9341 Metabolic encephalopathy: Secondary | ICD-10-CM | POA: Diagnosis not present

## 2024-12-13 DIAGNOSIS — E869 Volume depletion, unspecified: Secondary | ICD-10-CM | POA: Diagnosis not present

## 2024-12-13 DIAGNOSIS — F01518 Vascular dementia, unspecified severity, with other behavioral disturbance: Secondary | ICD-10-CM | POA: Diagnosis not present

## 2024-12-13 LAB — COMPREHENSIVE METABOLIC PANEL WITH GFR
ALT: 36 U/L (ref 0–44)
AST: 42 U/L — ABNORMAL HIGH (ref 15–41)
Albumin: 3.2 g/dL — ABNORMAL LOW (ref 3.5–5.0)
Alkaline Phosphatase: 66 U/L (ref 38–126)
Anion gap: 8 (ref 5–15)
BUN: 5 mg/dL — ABNORMAL LOW (ref 8–23)
CO2: 26 mmol/L (ref 22–32)
Calcium: 8.2 mg/dL — ABNORMAL LOW (ref 8.9–10.3)
Chloride: 104 mmol/L (ref 98–111)
Creatinine, Ser: 0.63 mg/dL (ref 0.44–1.00)
GFR, Estimated: >60 mL/min
Glucose, Bld: 145 mg/dL — ABNORMAL HIGH (ref 70–99)
Potassium: 3.6 mmol/L (ref 3.5–5.1)
Sodium: 138 mmol/L (ref 135–145)
Total Bilirubin: 0.7 mg/dL (ref 0.0–1.2)
Total Protein: 6 g/dL — ABNORMAL LOW (ref 6.5–8.1)

## 2024-12-13 LAB — GASTROINTESTINAL PANEL BY PCR, STOOL (REPLACES STOOL CULTURE)

## 2024-12-13 LAB — GLUCOSE, CAPILLARY
Glucose-Capillary: 150 mg/dL — ABNORMAL HIGH (ref 70–99)
Glucose-Capillary: 190 mg/dL — ABNORMAL HIGH (ref 70–99)
Glucose-Capillary: 175 mg/dL — ABNORMAL HIGH (ref 70–99)
Glucose-Capillary: 262 mg/dL — ABNORMAL HIGH (ref 70–99)

## 2024-12-13 LAB — CBC
HCT: 31.8 % — ABNORMAL LOW (ref 36.0–46.0)
Hemoglobin: 10.3 g/dL — ABNORMAL LOW (ref 12.0–15.0)
MCH: 28.7 pg (ref 26.0–34.0)
MCHC: 32.4 g/dL (ref 30.0–36.0)
MCV: 88.6 fL (ref 80.0–100.0)
Platelets: 244 K/uL (ref 150–400)
RBC: 3.59 MIL/uL — ABNORMAL LOW (ref 3.87–5.11)
RDW: 16.6 % — ABNORMAL HIGH (ref 11.5–15.5)
WBC: 8.9 K/uL (ref 4.0–10.5)
nRBC: 0 % (ref 0.0–0.2)

## 2024-12-13 LAB — MAGNESIUM: Magnesium: 1.7 mg/dL (ref 1.7–2.4)

## 2024-12-13 MED ORDER — LOPERAMIDE HCL 2 MG PO CAPS: 2.0000 mg | ORAL_CAPSULE | ORAL | Status: DC | PRN

## 2024-12-13 NOTE — Progress Notes (Signed)
 Physical Therapy Treatment Patient Details Name: [name] White MRN: [id number] DOB: 07/15/1941 Today's Date: 12/13/2024   History of Present Illness [name] White is a 83 y.o. female with medical history significant of anemia, anxiety, asthma, depression, type 2 diabetes, hyperlipidemia, hypothyroidism, recent residence in a skilled nursing facility brought into the emergency room today with complaints of altered mental status.  According to patient's daughter who is present at bedside they were able to engage in conversation with her about 4 days ago when they visited her at the nursing home.  Today patient is not able to engage in any meaningful conversation with them and they believe she is off from her baseline.  Patient denied abdominal pain headache, nausea or vomiting.  She has not been having good oral intake.    PT Comments  Patient is alert on therapist arrival and is able to answer questions appropriately.   She needs mod A for supine to sit today; can sit on the EOB with min to mod A to maintain balance.  Demonstrates core weakness and poor sitting balance with flexed posturing and posterior lean.  PT assists patient up to stand to RW with max A; 3 different trials with patient standing with head down and tends to push back in the walker; her feet tend to slide forward.  Returned to sitting each time for safety.  patient left in bed with call button in reach, bed alarm set and nursing notified of mobility status. Patient will benefit from continued skilled therapy services during the remainder of her hospital stay and at the next recommended venue of care to address deficits and promote return to optimal function.        If plan is discharge home, recommend the following: A lot of help with walking and/or transfers;A lot of help with bathing/dressing/bathroom;Supervision due to cognitive status;Help with stairs or ramp for entrance;Assist for transportation   Can travel by private vehicle      No  Equipment Recommendations  None recommended by PT    Recommendations for Other Services       Precautions / Restrictions Precautions Precautions: Fall Recall of Precautions/Restrictions: Impaired Restrictions Weight Bearing Restrictions Per Provider Order: No     Mobility  Bed Mobility Overal bed mobility: Needs Assistance Bed Mobility: Supine to Sit, Sit to Supine     Supine to sit: Mod assist Sit to supine: Mod assist   General bed mobility comments: needs mod A to come fully upright    Transfers Overall transfer level: Needs assistance Equipment used: Rolling walker (2 wheels) Transfers: Sit to/from Stand Sit to Stand: Max assist           General transfer comment: max Assist for sit to stand to RW; 3 attempts    Ambulation/Gait               General Gait Details: did not attempt to ambulate; patient states she is WC bound at baseline   Stairs             Wheelchair Mobility     Tilt Bed    Modified Rankin (Stroke Patients Only)       Balance Overall balance assessment: Needs assistance Sitting-balance support: Feet supported, Bilateral upper extremity supported Sitting balance-Leahy Scale: Fair Sitting balance - Comments: fair sitting balance on EOB; sits in flexed posturing with posterior lean; needs min to mod A to maintain balance Postural control: Posterior lean Standing balance support: Reliant on assistive device for balance, During  functional activity, Bilateral upper extremity supported Standing balance-Leahy Scale: Poor Standing balance comment: poor standing balance with RW and therapist assist                            Communication Communication Communication: No apparent difficulties Factors Affecting Communication: Difficulty expressing self  Cognition Arousal: Alert Behavior During Therapy: WFL for tasks assessed/performed   PT - Cognitive impairments: History of cognitive impairments                        PT - Cognition Comments: today able to answer questions and follow commands Following commands: Intact Following commands impaired: Follows one step commands with increased time    Cueing Cueing Techniques: Verbal cues  Exercises      General Comments        Pertinent Vitals/Pain Pain Assessment Pain Assessment: No/denies pain    Home Living                          Prior Function            PT Goals (current goals can now be found in the care plan section) Acute Rehab PT Goals Patient Stated Goal: return to SNF PT Goal Formulation: With patient Time For Goal Achievement: 12/13/24 Potential to Achieve Goals: Good Progress towards PT goals: Progressing toward goals    Frequency    Min 3X/week      PT Plan      Co-evaluation PT/OT/SLP Co-Evaluation/Treatment: Yes Reason for Co-Treatment: To address functional/ADL transfers PT goals addressed during session: Mobility/safety with mobility        AM-PAC PT 6 Clicks Mobility   Outcome Measure  Help needed turning from your back to your side while in a flat bed without using bedrails?: A Lot Help needed moving from lying on your back to sitting on the side of a flat bed without using bedrails?: A Lot Help needed moving to and from a bed to a chair (including a wheelchair)?: A Lot Help needed standing up from a chair using your arms (e.g., wheelchair or bedside chair)?: A Lot Help needed to walk in hospital room?: Total Help needed climbing 3-5 steps with a railing? : Total 6 Click Score: 10    End of Session Equipment Utilized During Treatment: Gait belt Activity Tolerance: Patient tolerated treatment well;Patient limited by fatigue Patient left: in bed;with call bell/phone within reach;with bed alarm set;with nursing/sitter in room Nurse Communication: Mobility status PT Visit Diagnosis: Other abnormalities of gait and mobility (R26.89);Muscle weakness (generalized)  (M62.81);Adult, failure to thrive (R62.7)     Time: 0300-0320 PT Time Calculation (min) (ACUTE ONLY): 20 min  Charges:    $Therapeutic Activity: 8-22 mins PT General Charges $$ ACUTE PT VISIT: 1 Visit                     3:26 PM, 12/13/2024 Kaely Hollan Small Gizell Danser MPT [facility] [phone] Ph:[phone]

## 2024-12-13 NOTE — Plan of Care (Incomplete)
" °  Problem: Education: Goal: Knowledge of General Education information will improve Description: Including pain rating scale, medication(s)/side effects and non-pharmacologic comfort measures Outcome: Progressing   Problem: Health Behavior/Discharge Planning: Goal: Ability to manage health-related needs will improve Outcome: Progressing   Problem: Clinical Measurements: Goal: Ability to maintain clinical measurements within normal limits will improve Outcome: Progressing Goal: Will remain free from infection Outcome: Progressing Goal: Diagnostic test results will improve Outcome: Progressing Goal: Respiratory complications will improve Outcome: Progressing Goal: Cardiovascular complication will be avoided Outcome: Progressing   Problem: Activity: Goal: Risk for activity intolerance will decrease Outcome: Progressing   Problem: Nutrition: Goal: Adequate nutrition will be maintained Outcome: Progressing   Problem: Coping: Goal: Level of anxiety will decrease Outcome: Progressing   Problem: Elimination: Goal: Will not experience complications related to bowel motility Outcome: Progressing Goal: Will not experience complications related to urinary retention Outcome: Progressing   Problem: Pain Managment: Goal: General experience of comfort will improve and/or be controlled Outcome: Progressing   Problem: Safety: Goal: Ability to remain free from injury will improve Outcome: Progressing   Problem: Skin Integrity: Goal: Risk for impaired skin integrity will decrease Outcome: Progressing   Problem: Education: Goal: Ability to describe self-care measures that may prevent or decrease complications (Diabetes Survival Skills Education) will improve Outcome: Progressing Goal: Individualized Educational Video(s) Outcome: Progressing   Problem: Coping: Goal: Ability to adjust to condition or change in health will improve Outcome: Progressing   Problem: Fluid  Volume: Goal: Ability to maintain a balanced intake and output will improve Outcome: Progressing   Problem: Health Behavior/Discharge Planning: Goal: Ability to identify and utilize available resources and services will improve Outcome: Progressing Goal: Ability to manage health-related needs will improve Outcome: Progressing   Problem: Health Behavior/Discharge Planning: Goal: Ability to identify and utilize available resources and services will improve Outcome: Progressing Goal: Ability to manage health-related needs will improve Outcome: Progressing   Problem: Metabolic: Goal: Ability to maintain appropriate glucose levels will improve Outcome: Progressing   Problem: Nutritional: Goal: Maintenance of adequate nutrition will improve Outcome: Progressing Goal: Progress toward achieving an optimal weight will improve Outcome: Progressing   Problem: Skin Integrity: Goal: Risk for impaired skin integrity will decrease Outcome: Progressing   Problem: Tissue Perfusion: Goal: Adequacy of tissue perfusion will improve Outcome: Progressing   "

## 2024-12-13 NOTE — Care Management Important Message (Signed)
 Important Message  Patient Details  Name: [name] White MRN: [id number] Date of Birth: October 18, 1941   Important Message Given:  Yes - Medicare IM     Brylie Sneath L Teana Lindahl 12/13/2024, 1:38 PM

## 2024-12-13 NOTE — Progress Notes (Signed)
 " Progress Note   Patient: [name] White FMW:[id number] DOB: [birth year] DOA: 12/10/2024  DOS: the patient was seen and examined on 12/13/2024   Brief hospital course:  83 y.o. female with medical history significant of anemia, anxiety, asthma, depression, type 2 diabetes, hyperlipidemia, hypothyroidism, recent residence in a skilled nursing facility brought into the emergency room today with complaints of altered mental status.    Assessment and Plan:   Acute metabolic encephalopathy - On presentation, patient not oriented, encephalopathic appearing.  Etiology unclear but likely multifactorial with possible underlying vascular dementia, dehydration, hospital delirium, acute diarrhea.  Does not appear to be any acute infectious source, UA negative, chest x-ray clear, no fever, but does have diarrhea.  C. difficile negative but GI panel negative.  IV fluids on board.  Appears to be resolved this morning.  Patient back to her baseline.  Acute diarrhea - Noted on multiple bowel movements on presentation.  No fever, bloody bowel movements.  C. difficile negative.  GI panel negative.  Bonded well to IV fluid bolus today, transition IV fluids to D5 half-normal with 20 of K at 100 mL/h.  Appears to be self resolving.  Loperamide  as needed.   Dehydration/volume depletion - Secondary to decreased p.o. intake and noted diarrhea.  Markedly improved after IV fluid hydration.  Anemia of chronic disease - No active bleeding appreciated.  Monitor CBC in AM.   Diabetes mellitus - Insulin  sliding scale on board.   Chronic asthma - Does not appear to be acute exacerbation.  Nebulizers as needed.   Hyperlipidemia/hypothyroidism/neuropathy - Home regimen on board.  Hypertension - Antihypertensives held for now.  Monitor blood pressure closely.   Physical debilitation muscle weakness - Patient very frail, recent residence in SNF.  Currently resides at [facility].  Now that encephalopathy is resolved,  will have PT/OT evaluate.  Ideally will be able to return to [facility] with [facility] (could be as soon as today).  Plan be to transition to STR.  Subjective: Patient markedly improved this morning, alert, talkative, encephalopathy appears resolved.  Daughter at bedside states she looks back to her normal self.  No fever, shortness of breath, chest pain, nausea, vomiting, abdominal pain.  Had a little bit of abdominal pain earlier in the morning now resolved after eating breakfast.  Physical Exam:  Vitals:   12/12/24 1356 12/12/24 1946 12/13/24 0248 12/13/24 0845  BP: 121/72 119/63 122/63   Pulse: 78 88 81   Resp: 16     Temp: 98.3 F (36.8 C) 98.8 F (37.1 C) 98.8 F (37.1 C)   TempSrc: Oral Axillary Axillary   SpO2: 96% 96% 96% 92%  Weight:      Height:        GENERAL:  Alert, pleasant, no acute distress, frail HEENT:  EOMI CARDIOVASCULAR:  RRR, no murmurs appreciated RESPIRATORY:  Clear to auscultation, no wheezing, rales, or rhonchi GASTROINTESTINAL:  Soft, nontender, nondistended EXTREMITIES:  No LE edema bilaterally NEURO:  No new focal deficits appreciated SKIN:  No rashes noted PSYCH:  Appropriate mood and affect     Data Reviewed:  Imaging Studies: MR BRAIN WO CONTRAST Result Date: 12/10/2024 EXAM: MRI BRAIN WITHOUT CONTRAST 12/10/2024 05:50:24 PM TECHNIQUE: Multiplanar multisequence MRI of the head/brain was performed without the administration of intravenous contrast. COMPARISON: Comparison made with CT from earlier the same day, as well as prior exam from 10/25/2024. CLINICAL HISTORY: Mental status change, unknown cause. FINDINGS: BRAIN AND VENTRICLES: No acute infarct. No intracranial hemorrhage. No mass.  No midline shift. No hydrocephalus. Generalized age-related cerebral atrophy. Patchy and confluent T2/FLAIR hyperintensity involving the periventricular and deep white matter of the hemispheres, consistent with chronic small vessel ischemic disease, moderate in nature.  Multiple remote lacunar infarcts present about the hemispheric cerebral white matter. Scattered chronic remote bilateral cerebellar infarcts noted. Previously identified pituitary macroadenoma measures up to approximately 2.4 cm, relatively stable from prior. Normal flow voids. ORBITS: Prior bilateral ocular lens replacement. SINUSES AND MASTOIDS: No acute abnormality. BONES AND SOFT TISSUES: Normal marrow signal. No acute soft tissue abnormality. IMPRESSION: 1. No acute intracranial abnormality. 2. Moderate chronic small vessel ischemic disease with multiple remote lacunar infarcts about the hemispheric cerebral white matter and cerebellum. 3. 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 Electronically signed by: Morene Hoard MD 12/10/2024 06:34 PM EST RP Workstation: [id number]   CT Head Wo Contrast Result Date: 12/10/2024 EXAM: CT HEAD WITHOUT CONTRAST 12/10/2024 03:32:44 PM TECHNIQUE: CT of the head was performed without the administration of intravenous contrast. Automated exposure control, iterative reconstruction, and/or weight based adjustment of the mA/kV was utilized to reduce the radiation dose to as low as reasonably achievable. COMPARISON: 10/12/2024 CLINICAL HISTORY: Altered mental status, nontraumatic (Ped 0-17y) FINDINGS: BRAIN AND VENTRICLES: No acute hemorrhage. No evidence of acute infarct. Global parenchymal atrophy with prominent CSF spaces and sulci. Periventricular and subcortical white matter patchy hypodensities, consistent with chronic small-vessel ischemic changes. Old right cerebellar infarct. Additional small remote left cerebellar infarct. Postsurgical changes of prior transsphenoidal resection of pituitary tumor. There is residual slightly hyperattenuating soft tissue along the superior aspect of the sella extending to the suprasellar cistern, better evaluated on recent prior MRI. Postsurgical changes of the sella. No hydrocephalus. No extra-axial  collection. No mass effect or midline shift. ORBITS: Bilateral lens replacement. Vascular calcifications. No acute abnormality. SINUSES: No acute abnormality. SOFT TISSUES AND SKULL: No acute soft tissue abnormality. No skull fracture. IMPRESSION: 1. No acute intracranial abnormality. 2. Global parenchymal atrophy, chronic small-vessel ischemic changes, and remote cerebellar infarcts. 3. Postsurgical changes of transsphenoidal resection of pituitary tumor with residual mass in the sella and suprasellar cistern, better evaluated on recent prior MRI. Electronically signed by: Donnice Mania MD 12/10/2024 04:01 PM EST RP Workstation: [id number]   DG Chest Portable 1 View Result Date: 12/10/2024 CLINICAL DATA:  Altered mental status EXAM: PORTABLE CHEST 1 VIEW COMPARISON:  November 08, 2024 FINDINGS: Stable cardiomediastinal silhouette. No definite acute pulmonary abnormality seen. The visualized skeletal structures are unremarkable. IMPRESSION: No active disease. Electronically Signed   By: Lynwood Landy Raddle M.D.   On: 12/10/2024 14:26    There are no new results to review at this time.  Previous records (including but not limited to H&P, progress notes, nursing notes, TOC management) were reviewed in assessment of this patient.  Labs: CBC: Recent Labs  Lab 12/10/24 1409 12/11/24 0504 12/12/24 0515 12/13/24 0521  WBC 13.9* 11.2* 11.2* 8.9  NEUTROABS 9.9*  --   --   --   HGB 11.2* 10.8* 10.5* 10.3*  HCT 34.2* 32.8* 32.4* 31.8*  MCV 89.1 89.1 89.3 88.6  PLT 245 248 226 244   Basic Metabolic Panel: Recent Labs  Lab 12/10/24 1409 12/11/24 0504 12/12/24 0515 12/13/24 0521  NA 135 139 134* 138  K 4.7 3.8 3.3* 3.6  CL 98 103 100 104  CO2 23 23 25 26   GLUCOSE 176* 98 113* 145*  BUN 15 13 9  5*  CREATININE 0.59 0.51 0.53 0.63  CALCIUM  8.5* 8.5* 8.2*  8.2*  MG  --   --  1.5* 1.7  PHOS  --   --  2.2*  --    Liver Function Tests: Recent Labs  Lab 12/10/24 1409 12/12/24 0515 12/13/24 0521   AST 28 26 42*  ALT 25 22 36  ALKPHOS 42 50 66  BILITOT 1.0 1.1 0.7  PROT 6.4* 5.9* 6.0*  ALBUMIN 3.9 3.2* 3.2*   CBG: Recent Labs  Lab 12/12/24 1152 12/12/24 1605 12/12/24 1943 12/13/24 0716 12/13/24 1125  GLUCAP 154* 144* 173* 150* 190*    Scheduled Meds:  aspirin  EC  81 mg Oral Daily   atorvastatin   80 mg Oral Daily   cycloSPORINE   1 drop Both Eyes BID   enoxaparin  (LOVENOX ) injection  40 mg Subcutaneous Q24H   feeding supplement  237 mL Oral BID BM   fluticasone  furoate-vilanterol  1 puff Inhalation Daily   insulin  aspart  0-9 Units Subcutaneous TID WC   levothyroxine   50 mcg Oral Q0600   midodrine   5 mg Oral TID WC   nitrofurantoin   50 mg Oral QHS   pantoprazole   40 mg Oral Daily   pregabalin   150 mg Oral BID   venlafaxine  XR  150 mg Oral BH-q7a   vortioxetine  HBr  10 mg Oral Daily   Continuous Infusions: PRN Meds:.acetaminophen , albuterol , busPIRone , polyethylene glycol  Family Communication: Daughter at bedside  Disposition: Status is: Inpatient Remains inpatient appropriate because: Encephalopathy, disposition     Time spent: 35 minutes  Length of inpatient stay: 2 days  Author: Carliss LELON Canales, DO 12/13/2024 1:13 PM  For on call review [email or web address].   "

## 2024-12-14 DIAGNOSIS — G9341 Metabolic encephalopathy: Secondary | ICD-10-CM | POA: Diagnosis not present

## 2024-12-14 DIAGNOSIS — F01518 Vascular dementia, unspecified severity, with other behavioral disturbance: Secondary | ICD-10-CM | POA: Diagnosis not present

## 2024-12-14 DIAGNOSIS — E869 Volume depletion, unspecified: Secondary | ICD-10-CM | POA: Diagnosis not present

## 2024-12-14 LAB — GLUCOSE, CAPILLARY
Glucose-Capillary: 136 mg/dL — ABNORMAL HIGH (ref 70–99)
Glucose-Capillary: 188 mg/dL — ABNORMAL HIGH (ref 70–99)
Glucose-Capillary: 257 mg/dL — ABNORMAL HIGH (ref 70–99)
Glucose-Capillary: 265 mg/dL — ABNORMAL HIGH (ref 70–99)

## 2024-12-14 MED ORDER — KCL IN DEXTROSE-NACL 20-5-0.45 MEQ/L-%-% IV SOLN: INTRAVENOUS | Status: AC

## 2024-12-14 NOTE — Plan of Care (Signed)

## 2024-12-14 NOTE — Plan of Care (Signed)
  Problem: Education: Goal: Knowledge of General Education information will improve Description: Including pain rating scale, medication(s)/side effects and non-pharmacologic comfort measures Outcome: Progressing   Problem: Health Behavior/Discharge Planning: Goal: Ability to manage health-related needs will improve Outcome: Progressing   Problem: Clinical Measurements: Goal: Ability to maintain clinical measurements within normal limits will improve Outcome: Progressing Goal: Will remain free from infection Outcome: Progressing Goal: Diagnostic test results will improve Outcome: Progressing Goal: Respiratory complications will improve Outcome: Progressing Goal: Cardiovascular complication will be avoided Outcome: Progressing   Problem: Activity: Goal: Risk for activity intolerance will decrease Outcome: Progressing   Problem: Nutrition: Goal: Adequate nutrition will be maintained Outcome: Progressing   Problem: Coping: Goal: Level of anxiety will decrease Outcome: Progressing   Problem: Elimination: Goal: Will not experience complications related to bowel motility Outcome: Progressing Goal: Will not experience complications related to urinary retention Outcome: Progressing   Problem: Pain Managment: Goal: General experience of comfort will improve and/or be controlled Outcome: Progressing   Problem: Safety: Goal: Ability to remain free from injury will improve Outcome: Progressing   Problem: Skin Integrity: Goal: Risk for impaired skin integrity will decrease Outcome: Progressing   Problem: Education: Goal: Ability to describe self-care measures that may prevent or decrease complications (Diabetes Survival Skills Education) will improve Outcome: Progressing Goal: Individualized Educational Video(s) Outcome: Progressing   Problem: Fluid Volume: Goal: Ability to maintain a balanced intake and output will improve Outcome: Progressing   Problem: Health  Behavior/Discharge Planning: Goal: Ability to identify and utilize available resources and services will improve Outcome: Progressing Goal: Ability to manage health-related needs will improve Outcome: Progressing   Problem: Metabolic: Goal: Ability to maintain appropriate glucose levels will improve Outcome: Progressing   Problem: Nutritional: Goal: Maintenance of adequate nutrition will improve Outcome: Progressing Goal: Progress toward achieving an optimal weight will improve Outcome: Progressing   Problem: Skin Integrity: Goal: Risk for impaired skin integrity will decrease Outcome: Progressing   Problem: Tissue Perfusion: Goal: Adequacy of tissue perfusion will improve Outcome: Progressing

## 2024-12-14 NOTE — TOC Progression Note (Signed)
 Transition of Care [facility]) - Progression Note    Patient Details  Name: [name] White MRN: [id number] Date of Birth: 07-Jan-1941  [facility] of [facility] CM/SW Contact  Noreen KATHEE [place], [place] Phone Number: 12/14/2024, 10:52 AM  Clinical Narrative:     CSW called daughter and informed her of PT note from yesterday, SNF is still being recommended. Daughter agreeable. Shara is still pending - had to add additional notes, per insurance request. Accepted facility is [facility]. CSW will continue to follow.  Expected Discharge Plan: Skilled Nursing Facility Barriers to Discharge: Continued Medical Work up               Expected Discharge Plan and Services     Post Acute Care Choice: Durable Medical Equipment Living arrangements for the past 2 months: Assisted Living Facility                           HH Arranged: PT       Representative spoke with at [facility] Agency: At facility   Social Drivers of Health (SDOH) Interventions SDOH Screenings   Food Insecurity: Patient Unable To Answer (12/12/2024)  Housing: Patient Unable To Answer (12/12/2024)  Transportation Needs: Patient Unable To Answer (12/12/2024)  Utilities: Patient Unable To Answer (12/12/2024)  Depression (PHQ2-9): Low Risk (10/28/2024)  Social Connections: Patient Unable To Answer (12/12/2024)  Recent Concern: Social Connections - Socially Isolated (11/08/2024)  Tobacco Use: Low Risk (12/10/2024)    Readmission Risk Interventions    12/14/2024   10:51 AM 12/12/2024    1:31 PM 11/09/2024    9:13 AM  Readmission Risk Prevention Plan  Transportation Screening Complete Complete Complete  Medication Review Oceanographer) Complete Complete Complete  HRI or Home Care Consult Complete Complete Complete  SW Recovery Care/Counseling Consult Complete Complete Complete  Palliative Care Screening Not Applicable Not Applicable Not Applicable  Skilled Nursing Facility Complete -- Not Applicable

## 2024-12-14 NOTE — Progress Notes (Signed)
 " Progress Note   Patient: [name] White FMW:[id number] DOB: [birth year] DOA: 12/10/2024  DOS: the patient was seen and examined on 12/14/2024   Brief hospital course:  83 y.o. female with medical history significant of anemia, anxiety, asthma, depression, type 2 diabetes, hyperlipidemia, hypothyroidism, recent residence in a skilled nursing facility brought into the emergency room today with complaints of altered mental status.    Assessment and Plan:   Acute metabolic encephalopathy - On presentation, patient not oriented, encephalopathic appearing.  Etiology unclear but likely multifactorial with possible underlying vascular dementia, dehydration, hospital delirium, acute diarrhea.  Does not appear to be any acute infectious source, UA negative, chest x-ray clear, no fever, but does have diarrhea.  C. difficile negative but GI panel negative.  IV fluids on board.  Appears to be resolved.  Patient back to her baseline.  Patient's daughter is adamant that skipping even 1 dose of Lyrica  or venlafaxine  will lead to encephalopathy.  Timing of symptoms make this a bit less likely but taking into consideration given daughter's familiarity with the patient.  Acute diarrhea - Noted on multiple bowel movements on presentation.  No fever, bloody bowel movements.  C. difficile negative.  GI panel negative.  Responding well to IV fluids D5 half-normal with 20 of Kh.  Appears to be self resolving.  Loperamide  as needed.   Dehydration/volume depletion - Secondary to decreased p.o. intake and noted diarrhea.  Markedly improved after IV fluid hydration.   Anemia of chronic disease - No active bleeding appreciated.  Monitor CBC in AM.   Diabetes mellitus - Insulin  sliding scale on board.   Chronic asthma - Does not appear to be acute exacerbation.  Nebulizers as needed.   Hyperlipidemia/hypothyroidism/neuropathy - Home regimen on board.   Hypertension - Antihypertensives held for now.  Monitor blood  pressure closely.   Physical debilitation muscle weakness - Patient very frail, recent residence in SNF.  Currently resides at [facility].  Now that encephalopathy is resolved, reevaluated by PT/OT.  Per patient's daughter, patient is mostly wheelchair-bound, has assistance to chair from bed.  Patient still frankly weak, would benefit from STR.  Subjective: Patient resting comfortably this morning, states she does not feel as well as yesterday.  Still alert, talkative, not encephalopathic.  States she just has some weakness and malaise.  Denies any fever, shortness of breath, chest pain, abdominal pain, diarrhea, nausea, vomiting.  Was motivated to return home yesterday and a bit disappointed she is here through the weekend.  Physical Exam:  Vitals:   12/13/24 0845 12/13/24 1317 12/13/24 1921 12/14/24 0309  BP:  102/69 118/81 137/61  Pulse:  91 82 85  Resp:   16 16  Temp:  98 F (36.7 C) 98 F (36.7 C) 98 F (36.7 C)  TempSrc:  Oral Oral Oral  SpO2: 92% 94% 96% 94%  Weight:      Height:        GENERAL:  Alert, pleasant, no acute distress, frail HEENT:  EOMI CARDIOVASCULAR:  RRR, no murmurs appreciated RESPIRATORY:  Clear to auscultation, no wheezing, rales, or rhonchi GASTROINTESTINAL:  Soft, nontender, nondistended EXTREMITIES:  No LE edema bilaterally NEURO:  No new focal deficits appreciated SKIN:  No rashes noted PSYCH:  Appropriate mood and affect    Data Reviewed:  Imaging Studies: MR BRAIN WO CONTRAST Result Date: 12/10/2024 EXAM: MRI BRAIN WITHOUT CONTRAST 12/10/2024 05:50:24 PM TECHNIQUE: Multiplanar multisequence MRI of the head/brain was performed without the administration of intravenous contrast. COMPARISON: Comparison  made with CT from earlier the same day, as well as prior exam from 10/25/2024. CLINICAL HISTORY: Mental status change, unknown cause. FINDINGS: BRAIN AND VENTRICLES: No acute infarct. No intracranial hemorrhage. No mass. No midline shift. No  hydrocephalus. Generalized age-related cerebral atrophy. Patchy and confluent T2/FLAIR hyperintensity involving the periventricular and deep white matter of the hemispheres, consistent with chronic small vessel ischemic disease, moderate in nature. Multiple remote lacunar infarcts present about the hemispheric cerebral white matter. Scattered chronic remote bilateral cerebellar infarcts noted. Previously identified pituitary macroadenoma measures up to approximately 2.4 cm, relatively stable from prior. Normal flow voids. ORBITS: Prior bilateral ocular lens replacement. SINUSES AND MASTOIDS: No acute abnormality. BONES AND SOFT TISSUES: Normal marrow signal. No acute soft tissue abnormality. IMPRESSION: 1. No acute intracranial abnormality. 2. Moderate chronic small vessel ischemic disease with multiple remote lacunar infarcts about the hemispheric cerebral white matter and cerebellum. 3. 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 Electronically signed by: Morene Hoard MD 12/10/2024 06:34 PM EST RP Workstation: [id number]   CT Head Wo Contrast Result Date: 12/10/2024 EXAM: CT HEAD WITHOUT CONTRAST 12/10/2024 03:32:44 PM TECHNIQUE: CT of the head was performed without the administration of intravenous contrast. Automated exposure control, iterative reconstruction, and/or weight based adjustment of the mA/kV was utilized to reduce the radiation dose to as low as reasonably achievable. COMPARISON: 10/12/2024 CLINICAL HISTORY: Altered mental status, nontraumatic (Ped 0-17y) FINDINGS: BRAIN AND VENTRICLES: No acute hemorrhage. No evidence of acute infarct. Global parenchymal atrophy with prominent CSF spaces and sulci. Periventricular and subcortical white matter patchy hypodensities, consistent with chronic small-vessel ischemic changes. Old right cerebellar infarct. Additional small remote left cerebellar infarct. Postsurgical changes of prior transsphenoidal resection of pituitary  tumor. There is residual slightly hyperattenuating soft tissue along the superior aspect of the sella extending to the suprasellar cistern, better evaluated on recent prior MRI. Postsurgical changes of the sella. No hydrocephalus. No extra-axial collection. No mass effect or midline shift. ORBITS: Bilateral lens replacement. Vascular calcifications. No acute abnormality. SINUSES: No acute abnormality. SOFT TISSUES AND SKULL: No acute soft tissue abnormality. No skull fracture. IMPRESSION: 1. No acute intracranial abnormality. 2. Global parenchymal atrophy, chronic small-vessel ischemic changes, and remote cerebellar infarcts. 3. Postsurgical changes of transsphenoidal resection of pituitary tumor with residual mass in the sella and suprasellar cistern, better evaluated on recent prior MRI. Electronically signed by: Donnice Mania MD 12/10/2024 04:01 PM EST RP Workstation: [id number]   DG Chest Portable 1 View Result Date: 12/10/2024 CLINICAL DATA:  Altered mental status EXAM: PORTABLE CHEST 1 VIEW COMPARISON:  November 08, 2024 FINDINGS: Stable cardiomediastinal silhouette. No definite acute pulmonary abnormality seen. The visualized skeletal structures are unremarkable. IMPRESSION: No active disease. Electronically Signed   By: Lynwood Landy Raddle M.D.   On: 12/10/2024 14:26    There are no new results to review at this time.  Previous records (including but not limited to H&P, progress notes, nursing notes, TOC management) were reviewed in assessment of this patient.  Labs: CBC: Recent Labs  Lab 12/10/24 1409 12/11/24 0504 12/12/24 0515 12/13/24 0521  WBC 13.9* 11.2* 11.2* 8.9  NEUTROABS 9.9*  --   --   --   HGB 11.2* 10.8* 10.5* 10.3*  HCT 34.2* 32.8* 32.4* 31.8*  MCV 89.1 89.1 89.3 88.6  PLT 245 248 226 244   Basic Metabolic Panel: Recent Labs  Lab 12/10/24 1409 12/11/24 0504 12/12/24 0515 12/13/24 0521  NA 135 139 134* 138  K 4.7 3.8 3.3* 3.6  CL 98 103 100 104  CO2 23 23 25 26    GLUCOSE 176* 98 113* 145*  BUN 15 13 9  5*  CREATININE 0.59 0.51 0.53 0.63  CALCIUM  8.5* 8.5* 8.2* 8.2*  MG  --   --  1.5* 1.7  PHOS  --   --  2.2*  --    Liver Function Tests: Recent Labs  Lab 12/10/24 1409 12/12/24 0515 12/13/24 0521  AST 28 26 42*  ALT 25 22 36  ALKPHOS 42 50 66  BILITOT 1.0 1.1 0.7  PROT 6.4* 5.9* 6.0*  ALBUMIN 3.9 3.2* 3.2*   CBG: Recent Labs  Lab 12/13/24 0716 12/13/24 1125 12/13/24 1626 12/13/24 1920 12/14/24 0727  GLUCAP 150* 190* 175* 262* 136*    Scheduled Meds:  aspirin  EC  81 mg Oral Daily   atorvastatin   80 mg Oral Daily   cycloSPORINE   1 drop Both Eyes BID   enoxaparin  (LOVENOX ) injection  40 mg Subcutaneous Q24H   feeding supplement  237 mL Oral BID BM   fluticasone  furoate-vilanterol  1 puff Inhalation Daily   insulin  aspart  0-9 Units Subcutaneous TID WC   levothyroxine   50 mcg Oral Q0600   midodrine   5 mg Oral TID WC   nitrofurantoin   50 mg Oral QHS   pantoprazole   40 mg Oral Daily   pregabalin   150 mg Oral BID   venlafaxine  XR  150 mg Oral BH-q7a   vortioxetine  HBr  10 mg Oral Daily   Continuous Infusions:  dextrose  5 % and 0.45 % NaCl with KCl 20 mEq/L     PRN Meds:.acetaminophen , albuterol , busPIRone , loperamide , polyethylene glycol  Family Communication: Daughter at bedside  Disposition: Status is: Inpatient Remains inpatient appropriate because: Disposition     Time spent: 35 minutes  Length of inpatient stay: 3 days  Author: Carliss LELON Canales, DO 12/14/2024 10:36 AM  For on call review [email or web address].   "

## 2024-12-15 DIAGNOSIS — G9341 Metabolic encephalopathy: Secondary | ICD-10-CM | POA: Diagnosis not present

## 2024-12-15 LAB — BASIC METABOLIC PANEL WITH GFR
Anion gap: 9 (ref 5–15)
BUN: 6 mg/dL — ABNORMAL LOW (ref 8–23)
CO2: 26 mmol/L (ref 22–32)
Calcium: 8.6 mg/dL — ABNORMAL LOW (ref 8.9–10.3)
Chloride: 102 mmol/L (ref 98–111)
Creatinine, Ser: 0.52 mg/dL (ref 0.44–1.00)
GFR, Estimated: >60 mL/min
Glucose, Bld: 174 mg/dL — ABNORMAL HIGH (ref 70–99)
Potassium: 4.4 mmol/L (ref 3.5–5.1)
Sodium: 137 mmol/L (ref 135–145)

## 2024-12-15 LAB — CBC
HCT: 33.6 % — ABNORMAL LOW (ref 36.0–46.0)
Hemoglobin: 10.7 g/dL — ABNORMAL LOW (ref 12.0–15.0)
MCH: 28.8 pg (ref 26.0–34.0)
MCHC: 31.8 g/dL (ref 30.0–36.0)
MCV: 90.3 fL (ref 80.0–100.0)
Platelets: 227 K/uL (ref 150–400)
RBC: 3.72 MIL/uL — ABNORMAL LOW (ref 3.87–5.11)
RDW: 16.7 % — ABNORMAL HIGH (ref 11.5–15.5)
WBC: 10.1 K/uL (ref 4.0–10.5)
nRBC: 0 % (ref 0.0–0.2)

## 2024-12-15 LAB — MAGNESIUM: Magnesium: 1.9 mg/dL (ref 1.7–2.4)

## 2024-12-15 LAB — CULTURE, BLOOD (ROUTINE X 2)
Culture: NO GROWTH
Culture: NO GROWTH
Special Requests: ADEQUATE

## 2024-12-15 LAB — GLUCOSE, CAPILLARY
Glucose-Capillary: 162 mg/dL — ABNORMAL HIGH (ref 70–99)
Glucose-Capillary: 269 mg/dL — ABNORMAL HIGH (ref 70–99)
Glucose-Capillary: 147 mg/dL — ABNORMAL HIGH (ref 70–99)
Glucose-Capillary: 228 mg/dL — ABNORMAL HIGH (ref 70–99)

## 2024-12-15 MED ORDER — KETOROLAC TROMETHAMINE 15 MG/ML IJ SOLN
15.0000 mg | Freq: Once | INTRAMUSCULAR | Status: AC
  Administered 2024-12-15: 15 mg via INTRAVENOUS
  Filled 2024-12-15: qty 1

## 2024-12-15 NOTE — Plan of Care (Signed)
 Problem: Education: Goal: Knowledge of General Education information will improve Description: Including pain rating scale, medication(s)/side effects and non-pharmacologic comfort measures Outcome: Progressing   Problem: Health Behavior/Discharge Planning: Goal: Ability to manage health-related needs will improve Outcome: Progressing   Problem: Clinical Measurements: Goal: Ability to maintain clinical measurements within normal limits will improve Outcome: Progressing Goal: Will remain free from infection Outcome: Progressing Goal: Diagnostic test results will improve Outcome: Progressing Goal: Respiratory complications will improve Outcome: Progressing Goal: Cardiovascular complication will be avoided Outcome: Progressing   Problem: Activity: Goal: Risk for activity intolerance will decrease Outcome: Progressing   Problem: Nutrition: Goal: Adequate nutrition will be maintained Outcome: Progressing   Problem: Coping: Goal: Level of anxiety will decrease Outcome: Progressing   Problem: Elimination: Goal: Will not experience complications related to bowel motility Outcome: Progressing Goal: Will not experience complications related to urinary retention Outcome: Progressing   Problem: Pain Managment: Goal: General experience of comfort will improve and/or be controlled Outcome: Progressing   Problem: Safety: Goal: Ability to remain free from injury will improve Outcome: Progressing   Problem: Skin Integrity: Goal: Risk for impaired skin integrity will decrease Outcome: Progressing   Problem: Education: Goal: Ability to describe self-care measures that may prevent or decrease complications (Diabetes Survival Skills Education) will improve Outcome: Progressing Goal: Individualized Educational Video(s) Outcome: Progressing   Problem: Coping: Goal: Ability to adjust to condition or change in health will improve Outcome: Progressing   Problem: Fluid  Volume: Goal: Ability to maintain a balanced intake and output will improve Outcome: Progressing   Problem: Health Behavior/Discharge Planning: Goal: Ability to identify and utilize available resources and services will improve Outcome: Progressing Goal: Ability to manage health-related needs will improve Outcome: Progressing   Problem: Metabolic: Goal: Ability to maintain appropriate glucose levels will improve Outcome: Progressing   Problem: Nutritional: Goal: Maintenance of adequate nutrition will improve Outcome: Progressing Goal: Progress toward achieving an optimal weight will improve Outcome: Progressing   Problem: Skin Integrity: Goal: Risk for impaired skin integrity will decrease Outcome: Progressing   Problem: Tissue Perfusion: Goal: Adequacy of tissue perfusion will improve Outcome: Progressing   Problem: Education: Goal: Knowledge of General Education information will improve Description: Including pain rating scale, medication(s)/side effects and non-pharmacologic comfort measures Outcome: Progressing   Problem: Health Behavior/Discharge Planning: Goal: Ability to manage health-related needs will improve Outcome: Progressing   Problem: Clinical Measurements: Goal: Ability to maintain clinical measurements within normal limits will improve Outcome: Progressing Goal: Will remain free from infection Outcome: Progressing Goal: Diagnostic test results will improve Outcome: Progressing Goal: Respiratory complications will improve Outcome: Progressing Goal: Cardiovascular complication will be avoided Outcome: Progressing   Problem: Activity: Goal: Risk for activity intolerance will decrease Outcome: Progressing   Problem: Nutrition: Goal: Adequate nutrition will be maintained Outcome: Progressing   Problem: Coping: Goal: Level of anxiety will decrease Outcome: Progressing   Problem: Elimination: Goal: Will not experience complications related to  bowel motility Outcome: Progressing Goal: Will not experience complications related to urinary retention Outcome: Progressing   Problem: Pain Managment: Goal: General experience of comfort will improve and/or be controlled Outcome: Progressing   Problem: Safety: Goal: Ability to remain free from injury will improve Outcome: Progressing   Problem: Skin Integrity: Goal: Risk for impaired skin integrity will decrease Outcome: Progressing   Problem: Education: Goal: Ability to describe self-care measures that may prevent or decrease complications (Diabetes Survival Skills Education) will improve Outcome: Progressing Goal: Individualized Educational Video(s) Outcome: Progressing   Problem: Coping: Goal: Ability  to adjust to condition or change in health will improve Outcome: Progressing   Problem: Fluid Volume: Goal: Ability to maintain a balanced intake and output will improve Outcome: Progressing   Problem: Health Behavior/Discharge Planning: Goal: Ability to identify and utilize available resources and services will improve Outcome: Progressing Goal: Ability to manage health-related needs will improve Outcome: Progressing   Problem: Metabolic: Goal: Ability to maintain appropriate glucose levels will improve Outcome: Progressing   Problem: Nutritional: Goal: Maintenance of adequate nutrition will improve Outcome: Progressing Goal: Progress toward achieving an optimal weight will improve Outcome: Progressing   Problem: Skin Integrity: Goal: Risk for impaired skin integrity will decrease Outcome: Progressing   Problem: Tissue Perfusion: Goal: Adequacy of tissue perfusion will improve Outcome: Progressing

## 2024-12-15 NOTE — Progress Notes (Signed)
 " PROGRESS NOTE    [name] White  FMW:[id number] DOB: [birth year] DOA: 12/10/2024 PCP: [name], MD   Brief Narrative:   83 y.o. female with medical history significant of anemia, anxiety, asthma, depression, type 2 diabetes, hyperlipidemia, hypothyroidism, recent residence in a skilled nursing facility brought into the emergency room today with complaints of altered mental status   Assessment & Plan:  Principal Problem:   Acute metabolic encephalopathy Active Problems:   Volume depletion   Vascular dementia (HCC)   Somnolence    Acute metabolic encephalopathy, POA: Resolved now. - Does not appear to be any acute infectious source, UA negative, chest x-ray clear, no fever, but does have diarrhea.  C. difficile negative but GI panel negative. Patient back to her baseline.  No focal neurological deficits No acute abnormalities on the CT of the head which was done on 12/10/2024    Acute diarrhea No fever, bloody bowel movements.  C. difficile negative.  GI panel negative.   Loperamide  as needed.   Dehydration/volume depletion - Secondary to decreased p.o. intake and noted diarrhea.  Markedly improved after IV fluid hydration.   Anemia of chronic disease - No active bleeding appreciated.  Monitor CBC    Diabetes mellitus type 2 - Insulin  sliding scale on board.   Chronic asthma - Does not appear to be acute exacerbation.  Nebulizers as needed.   Hyperlipidemia/hypothyroidism/neuropathy - Home regimen on board.   Hypertension - Antihypertensives held for now.  Monitor blood pressure closely.   Physical deconditioning, POA: - Patient very frail, recent residence in SNF.  Currently resides at [facility].  Now that encephalopathy is resolved, reevaluated by PT/OT.  Per patient's daughter, patient is mostly wheelchair-bound, has assistance to chair from bed.  Patient still frankly weak, would benefit from STR.    DVT prophylaxis: enoxaparin  (LOVENOX ) injection 40 mg Start:  12/11/24 1000     Code Status: Limited: Do not attempt resuscitation (DNR) -DNR-LIMITED -Do Not Intubate/DNI  Family Communication:  None at the bedside Status is: Inpatient Remains inpatient appropriate because: AMS,pending placement    Subjective:  No acute events overnight.  She told me that she is at [facility]ie's house.  When asked who is Ritchie, she was unable to answer.  I told her that she is in the hospital.  She was able to tell me her full name as well as her date of birth correctly.   Examination:  General exam: Appears calm and comfortable  Respiratory system: Clear to auscultation. Respiratory effort normal. Cardiovascular system: S1 & S2 heard, RRR. No JVD, murmurs, rubs, gallops or clicks. No pedal edema. Gastrointestinal system: Abdomen is nondistended, soft and nontender. No organomegaly or masses felt. Normal bowel sounds heard. Central nervous system: Alert and oriented x 1. No focal neurological deficits. Extremities: Symmetric 5 x 5 power. Skin: No rashes, lesions or ulcers    Diet Orders (From admission, onward)     Start     Ordered   12/10/24 2223  Diet Heart Room service appropriate? Yes; Fluid consistency: Thin  Diet effective now       Question Answer Comment  Room service appropriate? Yes   Fluid consistency: Thin      12/10/24 2223            Objective: Vitals:   12/14/24 1305 12/14/24 1943 12/15/24 0635 12/15/24 0638  BP: 119/62 128/65 120/67 (!) 114/57  Pulse: 82 80  83  Resp: 16 17  18   Temp: 98.1 F (36.7  C) 98.2 F (36.8 C)  97.9 F (36.6 C)  TempSrc: Oral Axillary  Oral  SpO2: 94% 94%  96%  Weight:      Height:        Intake/Output Summary (Last 24 hours) at 12/15/2024 0833 Last data filed at 12/15/2024 9187 Gross per 24 hour  Intake 1719.29 ml  Output 1300 ml  Net 419.29 ml   Filed Weights   12/10/24 1353 12/11/24 0008  Weight: 59 kg 59.7 kg    Scheduled Meds:  aspirin  EC  81 mg Oral Daily   atorvastatin   80  mg Oral Daily   cycloSPORINE   1 drop Both Eyes BID   enoxaparin  (LOVENOX ) injection  40 mg Subcutaneous Q24H   feeding supplement  237 mL Oral BID BM   fluticasone  furoate-vilanterol  1 puff Inhalation Daily   insulin  aspart  0-9 Units Subcutaneous TID WC   levothyroxine   50 mcg Oral Q0600   midodrine   5 mg Oral TID WC   nitrofurantoin   50 mg Oral QHS   pantoprazole   40 mg Oral Daily   pregabalin   150 mg Oral BID   venlafaxine  XR  150 mg Oral BH-q7a   vortioxetine  HBr  10 mg Oral Daily   Continuous Infusions:  dextrose  5 % and 0.45 % NaCl with KCl 20 mEq/L 75 mL/hr at 12/15/24 0437    Nutritional status     Body mass index is 20.61 kg/m.  Data Reviewed:   CBC: Recent Labs  Lab 12/10/24 1409 12/11/24 0504 12/12/24 0515 12/13/24 0521 12/15/24 0450  WBC 13.9* 11.2* 11.2* 8.9 10.1  NEUTROABS 9.9*  --   --   --   --   HGB 11.2* 10.8* 10.5* 10.3* 10.7*  HCT 34.2* 32.8* 32.4* 31.8* 33.6*  MCV 89.1 89.1 89.3 88.6 90.3  PLT 245 248 226 244 227   Basic Metabolic Panel: Recent Labs  Lab 12/10/24 1409 12/11/24 0504 12/12/24 0515 12/13/24 0521 12/15/24 0450  NA 135 139 134* 138 137  K 4.7 3.8 3.3* 3.6 4.4  CL 98 103 100 104 102  CO2 23 23 25 26 26   GLUCOSE 176* 98 113* 145* 174*  BUN 15 13 9  5* 6*  CREATININE 0.59 0.51 0.53 0.63 0.52  CALCIUM  8.5* 8.5* 8.2* 8.2* 8.6*  MG  --   --  1.5* 1.7 1.9  PHOS  --   --  2.2*  --   --    GFR: Estimated Creatinine Clearance: 50.2 mL/min (by C-G formula based on SCr of 0.52 mg/dL). Liver Function Tests: Recent Labs  Lab 12/10/24 1409 12/12/24 0515 12/13/24 0521  AST 28 26 42*  ALT 25 22 36  ALKPHOS 42 50 66  BILITOT 1.0 1.1 0.7  PROT 6.4* 5.9* 6.0*  ALBUMIN 3.9 3.2* 3.2*   No results for input(s): LIPASE, AMYLASE in the last 168 hours. No results for input(s): AMMONIA in the last 168 hours. Coagulation Profile: No results for input(s): INR, PROTIME in the last 168 hours. Cardiac Enzymes: Recent Labs   Lab 12/12/24 0515  CKTOTAL 129   BNP (last 3 results) Recent Labs    11/08/24 1329  PROBNP 1,197.0*   HbA1C: No results for input(s): HGBA1C in the last 72 hours. CBG: Recent Labs  Lab 12/14/24 0727 12/14/24 1123 12/14/24 1656 12/14/24 1942 12/15/24 0724  GLUCAP 136* 188* 257* 265* 162*   Lipid Profile: No results for input(s): CHOL, HDL, LDLCALC, TRIG, CHOLHDL, LDLDIRECT in the last 72 hours. Thyroid  Function Tests:  No results for input(s): TSH, T4TOTAL, FREET4, T3FREE, THYROIDAB in the last 72 hours. Anemia Panel: No results for input(s): VITAMINB12, FOLATE, FERRITIN, TIBC, IRON, RETICCTPCT in the last 72 hours. Sepsis Labs: Recent Labs  Lab 12/10/24 1409 12/10/24 1837  LATICACIDVEN 2.0* 1.8    Recent Results (from the past 240 hours)  Blood culture (routine x 2)     Status: None   Collection Time: 12/10/24  2:58 PM   Specimen: BLOOD  Result Value Ref Range Status   Specimen Description BLOOD BLOOD LEFT ARM LAC  Final   Special Requests   Final    BOTTLES DRAWN AEROBIC AND ANAEROBIC Blood Culture results may not be optimal due to an inadequate volume of blood received in culture bottles   Culture   Final    NO GROWTH 5 DAYS Performed at [facility], [address], [place], [place] [address]    Report Status 12/15/2024 FINAL  Final  Blood culture (routine x 2)     Status: None   Collection Time: 12/10/24  2:58 PM   Specimen: BLOOD  Result Value Ref Range Status   Specimen Description BLOOD BLOOD RIGHT ARM RAC  Final   Special Requests   Final    BOTTLES DRAWN AEROBIC AND ANAEROBIC Blood Culture adequate volume   Culture   Final    NO GROWTH 5 DAYS Performed at [facility], [address] [place], [place] [address]    Report Status 12/15/2024 FINAL  Final  C Difficile Quick Screen w PCR reflex     Status: None   Collection Time: 12/12/24  1:10 AM   Specimen: STOOL  Result Value Ref Range Status   C Diff antigen  NEGATIVE NEGATIVE Final   C Diff toxin NEGATIVE NEGATIVE Final   C Diff interpretation No C. difficile detected.  Final    Comment: Performed at [facility], [address] [place], [place] [address]  Gastrointestinal Panel by PCR , Stool     Status: None   Collection Time: 12/12/24  1:10 AM   Specimen: STOOL  Result Value Ref Range Status   Campylobacter species NOT DETECTED NOT DETECTED Final   Plesimonas shigelloides NOT DETECTED NOT DETECTED Final   Salmonella species NOT DETECTED NOT DETECTED Final   Yersinia enterocolitica NOT DETECTED NOT DETECTED Final   Vibrio species NOT DETECTED NOT DETECTED Final   Vibrio cholerae NOT DETECTED NOT DETECTED Final   Enteroaggregative E coli (EAEC) NOT DETECTED NOT DETECTED Final   Enteropathogenic E coli (EPEC) NOT DETECTED NOT DETECTED Final   Enterotoxigenic E coli (ETEC) NOT DETECTED NOT DETECTED Final   Shiga like toxin producing E coli (STEC) NOT DETECTED NOT DETECTED Final   Shigella/Enteroinvasive E coli (EIEC) NOT DETECTED NOT DETECTED Final   Cryptosporidium NOT DETECTED NOT DETECTED Final   Cyclospora cayetanensis NOT DETECTED NOT DETECTED Final   Entamoeba histolytica NOT DETECTED NOT DETECTED Final   Giardia lamblia NOT DETECTED NOT DETECTED Final   Adenovirus F40/41 NOT DETECTED NOT DETECTED Final   Astrovirus NOT DETECTED NOT DETECTED Final   Norovirus GI/GII NOT DETECTED NOT DETECTED Final   Rotavirus A NOT DETECTED NOT DETECTED Final   Sapovirus (I, II, IV, and V) NOT DETECTED NOT DETECTED Final    Comment: Performed at [facility], [address], [place], [place] [address]         Radiology Studies: No results found.         LOS: 4 days   Time spent=  35 mins    Deliliah Room, MD [facility]  If 7PM-7AM, please contact night-coverage  12/15/2024, 8:33 AM  "

## 2024-12-16 DIAGNOSIS — G9341 Metabolic encephalopathy: Secondary | ICD-10-CM | POA: Diagnosis not present

## 2024-12-16 LAB — GLUCOSE, CAPILLARY
Glucose-Capillary: 136 mg/dL — ABNORMAL HIGH (ref 70–99)
Glucose-Capillary: 185 mg/dL — ABNORMAL HIGH (ref 70–99)

## 2024-12-16 NOTE — Discharge Instructions (Signed)
 SABRA

## 2024-12-16 NOTE — Discharge Summary (Signed)
 " Physician Discharge Summary   Patient: [name] White MRN: [id number] DOB: 1941/04/17  Admit date:     12/10/2024  Discharge date: 12/16/2024  Discharge Physician: Deliliah Room   PCP: [name], MD   Recommendations at discharge:    F/u with your PCP in one week Continue taking meds as prescribed  Discharge Diagnoses: Principal Problem:   Acute metabolic encephalopathy Active Problems:   Volume depletion   Vascular dementia [facility])   Somnolence    Hospital Course: 83 y.o. female with medical history significant of anemia, anxiety, asthma, depression, type 2 diabetes, hyperlipidemia, hypothyroidism, recent residence in a skilled nursing facility brought into the emergency room with complaints of altered mental status.  Acute metabolic encephalopathy, POA: Resolved now. - Does not appear to be any acute infectious source, UA negative, chest x-ray clear, no fever, but does have diarrhea.  C. difficile negative but GI panel negative. Patient back to her baseline.  No focal neurological deficits No acute abnormalities on the CT of the head which was done on 12/10/2024     Acute diarrhea, resolved No fever, bloody bowel movements.  C. difficile negative.  GI panel negative.   Loperamide  as needed.   Dehydration/volume depletion, resolved - Secondary to decreased p.o. intake and noted diarrhea.  Received IVF   Anemia of chronic disease - No active bleeding appreciated.  Monitor CBC    Diabetes mellitus type 2 - Insulin  sliding scale on board.   Chronic asthma - Does not appear to be acute exacerbation.  Nebulizers as needed.   Hyperlipidemia/hypothyroidism/neuropathy - Home regimen on board.   Hypertension - Check BP daily   Physical deconditioning, POA: continue with PT/OT  Disposition: PNC      Consultants: None Procedures performed: None  Disposition: Skilled nursing facility Diet recommendation:  Cardiac diet DISCHARGE MEDICATION: Allergies as of  12/16/2024       Reactions   Betadine [povidone Iodine] Hives   Povidone-iodine Hives   Penicillins Rash, Dermatitis        Medication List     TAKE these medications    acetaminophen  325 MG tablet Commonly known as: TYLENOL  Take 2 tablets (650 mg total) by mouth every 6 (six) hours as needed for mild pain (pain score 1-3) (or Fever >/= 101).   albuterol  108 (90 Base) MCG/ACT inhaler Commonly known as: VENTOLIN  HFA Inhale 2 puffs into the lungs every 4 (four) hours as needed for wheezing or shortness of breath.   aspirin  EC 81 MG tablet Take 81 mg by mouth daily. Swallow whole.   atorvastatin  40 MG tablet Commonly known as: LIPITOR Take 80 mg by mouth daily.   busPIRone  5 MG tablet Commonly known as: BUSPAR  Take 5 mg by mouth 3 (three) times daily as needed (anxiety).   cyanocobalamin  1000 MCG/ML injection Commonly known as: VITAMIN B12 Inject 1,000 mcg into the muscle every 30 (thirty) days.   cycloSPORINE  0.05 % ophthalmic emulsion Commonly known as: RESTASIS  Place 1 drop into both eyes 2 (two) times daily.   fluticasone  furoate-vilanterol 200-25 MCG/ACT Aepb Commonly known as: Breo Ellipta  Inhale 1 puff into the lungs daily.   furosemide  20 MG tablet Commonly known as: Lasix  Take 1 tablet (20 mg total) by mouth daily.   Imvexxy  Maintenance Pack 10 MCG Inst Generic drug: Estradiol  Place 1 suppository vaginally 2 (two) times a week. What changed: additional instructions   ipratropium-albuterol  0.5-2.5 (3) MG/3ML Soln Commonly known as: DUONEB Take 3 mLs by nebulization in the morning,  at noon, and at bedtime. What changed: Another medication with the same name was changed. Make sure you understand how and when to take each.   ipratropium-albuterol  0.5-2.5 (3) MG/3ML Soln Commonly known as: DUONEB Take 3 mLs by nebulization every 4 (four) hours as needed (wheezing and SOB). What changed: reasons to take this   levothyroxine  50 MCG tablet Commonly  known as: SYNTHROID  Take 50 mcg by mouth daily.   metFORMIN  1000 MG tablet Commonly known as: GLUCOPHAGE  Take 1,000 mg by mouth 2 (two) times daily.   midodrine  5 MG tablet Commonly known as: PROAMATINE  Take 5 mg by mouth 3 (three) times daily with meals.   nitrofurantoin  50 MG capsule Commonly known as: Macrodantin  Take 1 capsule (50 mg total) by mouth at bedtime.   NONFORMULARY OR COMPOUNDED ITEM If patient is incontinent of urine and/or stool, SNF staff are instructed to cleanse patient's genital area at least once every 8 hours and change diaper if soiled.   NONFORMULARY OR COMPOUNDED ITEM SNF staff are instructed to encourage hydration with water  at least once every 2-4 hours while patient is awake.   NONFORMULARY OR COMPOUNDED ITEM SNF staff are instructed to prompt toileting and assist patient with ambulation to toilet at least once every 4-6 hours while awake.   omeprazole  20 MG capsule Commonly known as: PRILOSEC Take 1 capsule (20 mg total) by mouth 2 (two) times daily before a meal.   polyethylene glycol 17 g packet Commonly known as: MIRALAX  / GLYCOLAX  Take 17 g by mouth daily as needed for mild constipation.   potassium chloride  10 MEQ tablet Commonly known as: KLOR-CON  Take 10 mEq by mouth daily.   pregabalin  150 MG capsule Commonly known as: LYRICA  Take 1 capsule (150 mg total) by mouth 2 (two) times daily.   rizatriptan  10 MG disintegrating tablet Commonly known as: MAXALT -MLT Take 10 mg by mouth as needed for migraine.   Thera-M Tabs Take 1 tablet by mouth daily.   tiZANidine  2 MG tablet Commonly known as: ZANAFLEX  Take 2 mg by mouth 2 (two) times daily as needed for muscle spasms.   traZODone  50 MG tablet Commonly known as: DESYREL  Take 1 tablet (50 mg total) by mouth at bedtime as needed for sleep.   Trintellix  10 MG Tabs tablet Generic drug: vortioxetine  HBr Take 10 mg by mouth daily.   umeclidinium bromide  62.5 MCG/ACT Aepb Commonly  known as: INCRUSE ELLIPTA  Inhale 1 puff into the lungs daily.   venlafaxine  XR 150 MG 24 hr capsule Commonly known as: EFFEXOR -XR Take 150 mg by mouth every morning.        Contact information for follow-up providers     [name], MD. Schedule an appointment as soon as possible for a visit in 1 week(s).   Specialty: Internal Medicine Contact information: [address] [place] [address] [phone]              Contact information for after-discharge care     Destination     [facility] .   Service: Skilled Nursing Contact information: [address] [place]  [address] [phone]                    Discharge Exam: Filed Weights   12/10/24 1353 12/11/24 0008  Weight: 59 kg 59.7 kg   General exam: Appears calm and comfortable  Respiratory system: Clear to auscultation. Respiratory effort normal. Cardiovascular system: S1 & S2 heard, RRR. No JVD,  murmurs, rubs, gallops or clicks. No pedal edema. Gastrointestinal system: Abdomen is nondistended, soft and nontender. No organomegaly or masses felt. Normal bowel sounds heard. Central nervous system: Alert and oriented x 1. No focal neurological deficits. Extremities: Symmetric 5 x 5 power. Skin: No rashes, lesions or ulcers  Condition at discharge: good  The results of significant diagnostics from this hospitalization (including imaging, microbiology, ancillary and laboratory) are listed below for reference.   Imaging Studies: MR BRAIN WO CONTRAST Result Date: 12/10/2024 EXAM: MRI BRAIN WITHOUT CONTRAST 12/10/2024 05:50:24 PM TECHNIQUE: Multiplanar multisequence MRI of the head/brain was performed without the administration of intravenous contrast. COMPARISON: Comparison made with CT from earlier the same day, as well as prior exam from 10/25/2024. CLINICAL HISTORY: Mental status change, unknown cause. FINDINGS: BRAIN AND VENTRICLES: No acute infarct. No  intracranial hemorrhage. No mass. No midline shift. No hydrocephalus. Generalized age-related cerebral atrophy. Patchy and confluent T2/FLAIR hyperintensity involving the periventricular and deep white matter of the hemispheres, consistent with chronic small vessel ischemic disease, moderate in nature. Multiple remote lacunar infarcts present about the hemispheric cerebral white matter. Scattered chronic remote bilateral cerebellar infarcts noted. Previously identified pituitary macroadenoma measures up to approximately 2.4 cm, relatively stable from prior. Normal flow voids. ORBITS: Prior bilateral ocular lens replacement. SINUSES AND MASTOIDS: No acute abnormality. BONES AND SOFT TISSUES: Normal marrow signal. No acute soft tissue abnormality. IMPRESSION: 1. No acute intracranial abnormality. 2. Moderate chronic small vessel ischemic disease with multiple remote lacunar infarcts about the hemispheric cerebral white matter and cerebellum. 3. 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 Electronically signed by: Morene Hoard MD 12/10/2024 06:34 PM EST RP Workstation: [id number]   CT Head Wo Contrast Result Date: 12/10/2024 EXAM: CT HEAD WITHOUT CONTRAST 12/10/2024 03:32:44 PM TECHNIQUE: CT of the head was performed without the administration of intravenous contrast. Automated exposure control, iterative reconstruction, and/or weight based adjustment of the mA/kV was utilized to reduce the radiation dose to as low as reasonably achievable. COMPARISON: 10/12/2024 CLINICAL HISTORY: Altered mental status, nontraumatic (Ped 0-17y) FINDINGS: BRAIN AND VENTRICLES: No acute hemorrhage. No evidence of acute infarct. Global parenchymal atrophy with prominent CSF spaces and sulci. Periventricular and subcortical white matter patchy hypodensities, consistent with chronic small-vessel ischemic changes. Old right cerebellar infarct. Additional small remote left cerebellar infarct. Postsurgical  changes of prior transsphenoidal resection of pituitary tumor. There is residual slightly hyperattenuating soft tissue along the superior aspect of the sella extending to the suprasellar cistern, better evaluated on recent prior MRI. Postsurgical changes of the sella. No hydrocephalus. No extra-axial collection. No mass effect or midline shift. ORBITS: Bilateral lens replacement. Vascular calcifications. No acute abnormality. SINUSES: No acute abnormality. SOFT TISSUES AND SKULL: No acute soft tissue abnormality. No skull fracture. IMPRESSION: 1. No acute intracranial abnormality. 2. Global parenchymal atrophy, chronic small-vessel ischemic changes, and remote cerebellar infarcts. 3. Postsurgical changes of transsphenoidal resection of pituitary tumor with residual mass in the sella and suprasellar cistern, better evaluated on recent prior MRI. Electronically signed by: Donnice Mania MD 12/10/2024 04:01 PM EST RP Workstation: [id number]   DG Chest Portable 1 View Result Date: 12/10/2024 CLINICAL DATA:  Altered mental status EXAM: PORTABLE CHEST 1 VIEW COMPARISON:  November 08, 2024 FINDINGS: Stable cardiomediastinal silhouette. No definite acute pulmonary abnormality seen. The visualized skeletal structures are unremarkable. IMPRESSION: No active disease. Electronically Signed   By: Lynwood Landy Raddle M.D.   On: 12/10/2024 14:26    Microbiology: Results for orders placed or performed during the  hospital encounter of 12/10/24  Blood culture (routine x 2)     Status: None   Collection Time: 12/10/24  2:58 PM   Specimen: BLOOD  Result Value Ref Range Status   Specimen Description BLOOD BLOOD LEFT ARM LAC  Final   Special Requests   Final    BOTTLES DRAWN AEROBIC AND ANAEROBIC Blood Culture results may not be optimal due to an inadequate volume of blood received in culture bottles   Culture   Final    NO GROWTH 5 DAYS Performed at [facility], [address] [place], [place] [address]    Report Status  12/15/2024 FINAL  Final  Blood culture (routine x 2)     Status: None   Collection Time: 12/10/24  2:58 PM   Specimen: BLOOD  Result Value Ref Range Status   Specimen Description BLOOD BLOOD RIGHT ARM RAC  Final   Special Requests   Final    BOTTLES DRAWN AEROBIC AND ANAEROBIC Blood Culture adequate volume   Culture   Final    NO GROWTH 5 DAYS Performed at [facility], [address], [place], [place] [address]    Report Status 12/15/2024 FINAL  Final  C Difficile Quick Screen w PCR reflex     Status: None   Collection Time: 12/12/24  1:10 AM   Specimen: STOOL  Result Value Ref Range Status   C Diff antigen NEGATIVE NEGATIVE Final   C Diff toxin NEGATIVE NEGATIVE Final   C Diff interpretation No C. difficile detected.  Final    Comment: Performed at [facility], [address] [place], [place] [address]  Gastrointestinal Panel by PCR , Stool     Status: None   Collection Time: 12/12/24  1:10 AM   Specimen: STOOL  Result Value Ref Range Status   Campylobacter species NOT DETECTED NOT DETECTED Final   Plesimonas shigelloides NOT DETECTED NOT DETECTED Final   Salmonella species NOT DETECTED NOT DETECTED Final   Yersinia enterocolitica NOT DETECTED NOT DETECTED Final   Vibrio species NOT DETECTED NOT DETECTED Final   Vibrio cholerae NOT DETECTED NOT DETECTED Final   Enteroaggregative E coli (EAEC) NOT DETECTED NOT DETECTED Final   Enteropathogenic E coli (EPEC) NOT DETECTED NOT DETECTED Final   Enterotoxigenic E coli (ETEC) NOT DETECTED NOT DETECTED Final   Shiga like toxin producing E coli (STEC) NOT DETECTED NOT DETECTED Final   Shigella/Enteroinvasive E coli (EIEC) NOT DETECTED NOT DETECTED Final   Cryptosporidium NOT DETECTED NOT DETECTED Final   Cyclospora cayetanensis NOT DETECTED NOT DETECTED Final   Entamoeba histolytica NOT DETECTED NOT DETECTED Final   Giardia lamblia NOT DETECTED NOT DETECTED Final   Adenovirus F40/41 NOT DETECTED NOT DETECTED Final   Astrovirus NOT  DETECTED NOT DETECTED Final   Norovirus GI/GII NOT DETECTED NOT DETECTED Final   Rotavirus A NOT DETECTED NOT DETECTED Final   Sapovirus (I, II, IV, and V) NOT DETECTED NOT DETECTED Final    Comment: Performed at [facility], [address] [place], [place] [address]    Labs: CBC: Recent Labs  Lab 12/10/24 1409 12/11/24 0504 12/12/24 0515 12/13/24 0521 12/15/24 0450  WBC 13.9* 11.2* 11.2* 8.9 10.1  NEUTROABS 9.9*  --   --   --   --   HGB 11.2* 10.8* 10.5* 10.3* 10.7*  HCT 34.2* 32.8* 32.4* 31.8* 33.6*  MCV 89.1 89.1 89.3 88.6 90.3  PLT 245 248 226 244 227   Basic Metabolic Panel: Recent Labs  Lab  12/10/24 1409 12/11/24 0504 12/12/24 0515 12/13/24 0521 12/15/24 0450  NA 135 139 134* 138 137  K 4.7 3.8 3.3* 3.6 4.4  CL 98 103 100 104 102  CO2 23 23 25 26 26   GLUCOSE 176* 98 113* 145* 174*  BUN 15 13 9  5* 6*  CREATININE 0.59 0.51 0.53 0.63 0.52  CALCIUM  8.5* 8.5* 8.2* 8.2* 8.6*  MG  --   --  1.5* 1.7 1.9  PHOS  --   --  2.2*  --   --    Liver Function Tests: Recent Labs  Lab 12/10/24 1409 12/12/24 0515 12/13/24 0521  AST 28 26 42*  ALT 25 22 36  ALKPHOS 42 50 66  BILITOT 1.0 1.1 0.7  PROT 6.4* 5.9* 6.0*  ALBUMIN 3.9 3.2* 3.2*   CBG: Recent Labs  Lab 12/15/24 0724 12/15/24 1134 12/15/24 1619 12/15/24 2048 12/16/24 0736  GLUCAP 162* 269* 147* 228* 136*    Discharge time spent: 40 minutes.  Signed: Deliliah Room, MD [facility] 12/16/2024 "

## 2024-12-16 NOTE — Plan of Care (Signed)

## 2024-12-16 NOTE — Care Management Important Message (Signed)
 Important Message  Patient Details  Name: [name] White MRN: [id number] Date of Birth: 09/18/1941   Important Message Given:  Other (see comment) (patient received copy of letter)     [name] 12/16/2024, 10:53 AM

## 2024-12-16 NOTE — TOC Transition Note (Signed)
 Transition of Care [facility]) - Discharge Note   Patient Details  Name: [name] White MRN: [id number] Date of Birth: [birth year]  Transition of Care [facility]) CM/SW Contact:  Noreen KATHEE Cleotilde ISRAEL Phone Number: 12/16/2024, 9:48 AM   Clinical Narrative:     Patient is DC today to [facility] for short-term rehab. CSW notified daughter Olam. Room and report number given to nurse. CSW signing off.   Final next level of care: Skilled Nursing Facility Barriers to Discharge: Barriers Resolved   Patient Goals and CMS Choice Patient states their goals for this hospitalization and ongoing recovery are:: STR at [facility] CMS Medicare.gov Compare Post Acute Care list provided to:: Patient Represenative (must comment) Choice offered to / list presented to : Adult Children      Discharge Placement              Patient chooses bed at: [facility] Patient to be transferred to facility by: Staff via [facility] Name of family member notified: Olam- daughter Patient and family notified of of transfer: 12/16/24  Discharge Plan and Services Additional resources added to the After Visit Summary for       Post Acute Care Choice: Durable Medical Equipment                    [facility] Arranged: PT       Representative spoke with at [facility] Agency: At facility  Social Drivers of Health (SDOH) Interventions SDOH Screenings   Food Insecurity: Patient Unable To Answer (12/12/2024)  Housing: Patient Unable To Answer (12/12/2024)  Transportation Needs: Patient Unable To Answer (12/12/2024)  Utilities: Patient Unable To Answer (12/12/2024)  Depression (PHQ2-9): Low Risk (10/28/2024)  Social Connections: Patient Unable To Answer (12/12/2024)  Recent Concern: Social Connections - Socially Isolated (11/08/2024)  Tobacco Use: Low Risk (12/10/2024)     Readmission Risk Interventions    12/16/2024    9:47 AM 12/14/2024   10:51 AM 12/12/2024    1:31 PM  Readmission Risk Prevention Plan  Transportation  Screening Complete Complete Complete  Medication Review Oceanographer) Complete Complete Complete  HRI or Home Care Consult Complete Complete Complete  SW Recovery Care/Counseling Consult Complete Complete Complete  Palliative Care Screening Not Applicable Not Applicable Not Applicable  Skilled Nursing Facility Complete Complete --

## 2024-12-17 ENCOUNTER — Other Ambulatory Visit: Payer: Self-pay | Admitting: Adult Health

## 2024-12-17 ENCOUNTER — Non-Acute Institutional Stay (SKILLED_NURSING_FACILITY): Payer: Self-pay | Admitting: Adult Health

## 2024-12-17 ENCOUNTER — Encounter: Payer: Self-pay | Admitting: Adult Health

## 2024-12-17 DIAGNOSIS — E876 Hypokalemia: Secondary | ICD-10-CM

## 2024-12-17 DIAGNOSIS — E1142 Type 2 diabetes mellitus with diabetic polyneuropathy: Secondary | ICD-10-CM

## 2024-12-17 DIAGNOSIS — G43809 Other migraine, not intractable, without status migrainosus: Secondary | ICD-10-CM | POA: Diagnosis not present

## 2024-12-17 DIAGNOSIS — F419 Anxiety disorder, unspecified: Secondary | ICD-10-CM

## 2024-12-17 DIAGNOSIS — I959 Hypotension, unspecified: Secondary | ICD-10-CM

## 2024-12-17 DIAGNOSIS — E039 Hypothyroidism, unspecified: Secondary | ICD-10-CM | POA: Diagnosis not present

## 2024-12-17 DIAGNOSIS — F01C18 Vascular dementia, severe, with other behavioral disturbance: Secondary | ICD-10-CM | POA: Diagnosis not present

## 2024-12-17 DIAGNOSIS — E1169 Type 2 diabetes mellitus with other specified complication: Secondary | ICD-10-CM

## 2024-12-17 DIAGNOSIS — G9341 Metabolic encephalopathy: Secondary | ICD-10-CM | POA: Diagnosis not present

## 2024-12-17 DIAGNOSIS — N390 Urinary tract infection, site not specified: Secondary | ICD-10-CM | POA: Diagnosis not present

## 2024-12-17 DIAGNOSIS — J441 Chronic obstructive pulmonary disease with (acute) exacerbation: Secondary | ICD-10-CM

## 2024-12-17 DIAGNOSIS — F32A Depression, unspecified: Secondary | ICD-10-CM

## 2024-12-17 DIAGNOSIS — Z7984 Long term (current) use of oral hypoglycemic drugs: Secondary | ICD-10-CM | POA: Diagnosis not present

## 2024-12-17 DIAGNOSIS — K219 Gastro-esophageal reflux disease without esophagitis: Secondary | ICD-10-CM

## 2024-12-17 DIAGNOSIS — E7800 Pure hypercholesterolemia, unspecified: Secondary | ICD-10-CM

## 2024-12-17 MED ORDER — PREGABALIN 150 MG PO CAPS: 150.0000 mg | ORAL_CAPSULE | Freq: Two times a day (BID) | ORAL | 0 refills | Status: DC

## 2024-12-17 NOTE — Progress Notes (Signed)
 " Location:  [facility] Room Number: 126 Place of Service:  SNF (31)   CODE STATUS: dnr   Allergies[1]  Chief Complaint  Patient presents with   Hospitalization Follow-up    HPI:  She is a 83 year old woman who has been hospitalized from 12-10-24 through 12-16-24. Her past medical history includes: anemia; depression; type 2 diabetes mellitus. She had been a resident of [place] and had been brought to the ED for altered mental status.  Acute metabolic encephalopathy: now resolved; does not appear to be from an acute infectious source; UA negative; chest x-ray clear; no fevers. The CT of head did not demonstrate acute processes. Did have diarrhea the workup was negative for c-difficile and GI panel. She is now back to her baseline. Her diarrhea did resolve. Her dehydration resolved with IVF.  She is here for short term rehab with her goal to return back home to [place]. She will continue to be followed for her chronic illnesses including: Hypotension, unspecified hypotension type: GERD without esophagitis:  COPD with acute exacerbation    Past Medical History:  Diagnosis Date   Anemia    Anxiety    Asthma    ASYMPTOMATIC POSTMENOPAUSAL STATUS 02/09/2009   Cataract    Cerebral infarction [facility])    Constipation 03/19/2013   Depression    DIABETES MELLITUS, TYPE II 09/14/2007   HYPERCHOLESTEROLEMIA 02/09/2009   HYPERTENSION 02/09/2009   HYPOTHYROIDISM 09/14/2007   Metabolic encephalopathy    MIGRAINE HEADACHE 09/14/2007   Neuropathy    OSTEOPOROSIS 02/09/2009   PANCREATITIS 09/14/2007   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   PONV (postoperative nausea and vomiting)    Protein calorie malnutrition    Rectocele 03/19/2013   Shortness of breath    SUPERFICIAL PHLEBITIS 09/14/2007   Varicose veins     Past Surgical History:  Procedure Laterality Date   ABDOMINAL HYSTERECTOMY     ANTERIOR AND POSTERIOR REPAIR N/A 04/02/2013   Procedure: ANTERIOR  (CYSTOCELE) AND POSTERIOR REPAIR (RECTOCELE);  Surgeon: Norleen LULLA Server, MD;  Location: [facility] ORS;  Service: Gynecology;  Laterality: N/A;   APPENDECTOMY     BRAIN SURGERY     CATARACT EXTRACTION W/PHACO  12/05/2011   Procedure: CATARACT EXTRACTION PHACO AND INTRAOCULAR LENS PLACEMENT (IOC);  Surgeon: Cherene Mania;  Location: [facility] ORS;  Service: Ophthalmology;  Laterality: Left;  CDE:9.65   CHOLECYSTECTOMY     CRANIOTOMY N/A 06/04/2020   Procedure: Endoscopic Transphenoidal Resection of Recurrent PituitaryTumor;  Surgeon: Colon Shove, MD;  Location: [facility] OR;  Service: Neurosurgery;  Laterality: N/A;   CYSTOSCOPY     ENDOVENOUS ABLATION SAPHENOUS VEIN W/ LASER  11-03-2011   right greater saphenous vein   left leg done 10-2011   EYE SURGERY  98   right cataract extraction 98   LAPAROSCOPIC NISSEN FUNDOPLICATION     NM ESOPHAGEAL REFLUX  08-11-11   PITUITARY EXCISION  10/1997   POSTERIOR REPAIR     TRANSNASAL APPROACH N/A 06/04/2020   Procedure: TRANSNASAL APPROACH;  Surgeon: Mable Lenis, MD;  Location: [facility] OR;  Service: ENT;  Laterality: N/A;   TRANSPHENOIDAL / TRANSNASAL HYPOPHYSECTOMY / RESECTION PITUITARY TUMOR  08-11-11    Social History   Socioeconomic History   Marital status: Married    Spouse name: Not on file   Number of children: Not on file   Years of education: Not on file   Highest education level: Not on file  Occupational History   Occupation: Retired  Employer: RETIRED  Tobacco Use   Smoking status: Never   Smokeless tobacco: Never  Vaping Use   Vaping status: Never Used  Substance and Sexual Activity   Alcohol use: No    Alcohol/week: 0.0 standard drinks of alcohol   Drug use: No   Sexual activity: Not Currently    Birth control/protection: Surgical  Other Topics Concern   Not on file  Social History Narrative   Not on file   Social Drivers of Health   Tobacco Use: Low Risk (12/17/2024)   Patient History    Smoking Tobacco Use: Never    Smokeless  Tobacco Use: Never    Passive Exposure: Not on file  Financial Resource Strain: Not on file  Food Insecurity: Patient Unable To Answer (12/12/2024)   Epic    Worried About Programme Researcher, Broadcasting/film/video in the Last Year: Patient unable to answer    Ran Out of Food in the Last Year: Patient unable to answer  Transportation Needs: Patient Unable To Answer (12/12/2024)   Epic    Lack of Transportation (Medical): Patient unable to answer    Lack of Transportation (Non-Medical): Patient unable to answer  Physical Activity: Not on file  Stress: Not on file  Social Connections: Patient Unable To Answer (12/12/2024)   Social Connection and Isolation Panel    Frequency of Communication with Friends and Family: Patient unable to answer    Frequency of Social Gatherings with Friends and Family: Patient unable to answer    Attends Religious Services: Patient unable to answer    Active Member of Clubs or Organizations: Patient unable to answer    Attends Banker Meetings: Patient unable to answer    Marital Status: Patient unable to answer  Recent Concern: Social Connections - Socially Isolated (11/08/2024)   Social Connection and Isolation Panel    Frequency of Communication with Friends and Family: More than three times a week    Frequency of Social Gatherings with Friends and Family: Twice a week    Attends Religious Services: Never    Database Administrator or Organizations: No    Attends Banker Meetings: Never    Marital Status: Widowed  Intimate Partner Violence: Patient Unable To Answer (12/12/2024)   Epic    Fear of Current or Ex-Partner: Patient unable to answer    Emotionally Abused: Patient unable to answer    Physically Abused: Patient unable to answer    Sexually Abused: Patient unable to answer  Depression (PHQ2-9): Low Risk (10/28/2024)   Depression (PHQ2-9)    PHQ-2 Score: 0  Alcohol Screen: Not on file  Housing: Patient Unable To Answer (12/12/2024)   Epic     Unable to Pay for Housing in the Last Year: Patient unable to answer    Number of Times Moved in the Last Year: Not on file    Homeless in the Last Year: Patient unable to answer  Utilities: Patient Unable To Answer (12/12/2024)   Epic    Threatened with loss of utilities: Patient unable to answer  Health Literacy: Not on file   Family History  Problem Relation Age of Onset   Heart disease Mother    Asthma Mother    COPD Father    Heart disease Father    Stroke Father    Asthma Daughter    Migraines Daughter    Neuropathy Son    Cancer Neg Hx    Anesthesia problems Neg Hx    Hypotension  Neg Hx    Malignant hyperthermia Neg Hx    Pseudochol deficiency Neg Hx       VITAL SIGNS BP 126/74   Pulse 86   Temp (!) 97.1 F (36.2 C)   Resp 17   Ht 5' 7 (1.702 m)   Wt 138 lb 3.2 oz (62.7 kg)   SpO2 97%   BMI 21.65 kg/m   Outpatient Encounter Medications as of 12/17/2024  Medication Sig   acetaminophen  (TYLENOL ) 325 MG tablet Take 2 tablets (650 mg total) by mouth every 6 (six) hours as needed for mild pain (pain score 1-3) (or Fever >/= 101).   albuterol  (VENTOLIN  HFA) 108 (90 Base) MCG/ACT inhaler Inhale 2 puffs into the lungs every 4 (four) hours as needed for wheezing or shortness of breath.   aspirin  EC 81 MG tablet Take 81 mg by mouth daily. Swallow whole.   atorvastatin  (LIPITOR) 40 MG tablet Take 80 mg by mouth daily.   busPIRone  (BUSPAR ) 5 MG tablet Take 5 mg by mouth 3 (three) times daily as needed (anxiety).   cyanocobalamin  (VITAMIN B12) 1000 MCG/ML injection Inject 1,000 mcg into the muscle every 30 (thirty) days.   cycloSPORINE  (RESTASIS ) 0.05 % ophthalmic emulsion Place 1 drop into both eyes 2 (two) times daily.   Estradiol  (IMVEXXY  MAINTENANCE PACK) 10 MCG INST Place 1 suppository vaginally 2 (two) times a week. (Patient taking differently: Place 1 suppository vaginally 2 (two) times a week. Mondays and Fridays)   fluticasone  furoate-vilanterol (BREO ELLIPTA )  200-25 MCG/ACT AEPB Inhale 1 puff into the lungs daily.   furosemide  (LASIX ) 20 MG tablet Take 1 tablet (20 mg total) by mouth daily.   ipratropium-albuterol  (DUONEB) 0.5-2.5 (3) MG/3ML SOLN Take 3 mLs by nebulization every 4 (four) hours as needed (wheezing and SOB). (Patient taking differently: Take 3 mLs by nebulization every 4 (four) hours as needed (wheezing and shortness of breath).)   ipratropium-albuterol  (DUONEB) 0.5-2.5 (3) MG/3ML SOLN Take 3 mLs by nebulization in the morning, at noon, and at bedtime.   levothyroxine  (SYNTHROID ) 50 MCG tablet Take 50 mcg by mouth daily.   metFORMIN  (GLUCOPHAGE ) 1000 MG tablet Take 1,000 mg by mouth 2 (two) times daily.   midodrine  (PROAMATINE ) 5 MG tablet Take 5 mg by mouth 3 (three) times daily with meals.   Multiple Vitamins-Minerals (THERA-M) TABS Take 1 tablet by mouth daily.   nitrofurantoin  (MACRODANTIN ) 50 MG capsule Take 1 capsule (50 mg total) by mouth at bedtime.   omeprazole  (PRILOSEC) 20 MG capsule Take 1 capsule (20 mg total) by mouth 2 (two) times daily before a meal.   polyethylene glycol (MIRALAX  / GLYCOLAX ) 17 g packet Take 17 g by mouth daily as needed for mild constipation.   potassium chloride  (KLOR-CON ) 10 MEQ tablet Take 10 mEq by mouth daily.   pregabalin  (LYRICA ) 150 MG capsule Take 1 capsule (150 mg total) by mouth 2 (two) times daily.   rizatriptan  (MAXALT -MLT) 10 MG disintegrating tablet Take 10 mg by mouth as needed for migraine.   tiZANidine  (ZANAFLEX ) 2 MG tablet Take 2 mg by mouth 2 (two) times daily as needed for muscle spasms.   traZODone  (DESYREL ) 50 MG tablet Take 1 tablet (50 mg total) by mouth at bedtime as needed for sleep.   TRINTELLIX  10 MG TABS tablet Take 10 mg by mouth daily.   umeclidinium bromide  (INCRUSE ELLIPTA ) 62.5 MCG/ACT AEPB Inhale 1 puff into the lungs daily.   venlafaxine  (EFFEXOR -XR) 150 MG 24 hr capsule Take 150 mg  by mouth every morning.   No facility-administered encounter medications on file as  of 12/17/2024.     SIGNIFICANT DIAGNOSTIC EXAMS  LABS  12-10-24: wbc 13.9; hgb 11.2; hct 34.2; mcv 89.1 plt 245 glucose 176; bun 15; creat 0.59; k+ 4.7; na++ 135; ca 8.5 gfr >60; protein 6.4 albumin 3.9; blood culture: no growth; UA; neg 12-12-24: mag 1.5 12-15-24; wbc 10.1; hgb 10.7; hct 336.; mcv 90.3 pt 227; glucose 174; bun 6; creat 0.52; k+ 4.4; na++ 137; ca 8.6; gfr >60 mag 1.9   Review of Systems  Constitutional:  Negative for malaise/fatigue.  Respiratory:  Negative for cough and shortness of breath.   Cardiovascular:  Negative for chest pain, palpitations and leg swelling.  Gastrointestinal:  Negative for abdominal pain, constipation and heartburn.  Musculoskeletal:  Negative for back pain, joint pain and myalgias.  Skin: Negative.   Neurological:  Negative for dizziness.  Psychiatric/Behavioral:  The patient is not nervous/anxious.     Physical Exam Constitutional:      General: She is not in acute distress.    Appearance: She is well-developed. She is not diaphoretic.  Neck:     Thyroid : No thyromegaly.  Cardiovascular:     Rate and Rhythm: Normal rate and regular rhythm.     Heart sounds: Normal heart sounds.  Pulmonary:     Effort: Pulmonary effort is normal. No respiratory distress.     Breath sounds: Normal breath sounds.  Abdominal:     General: Bowel sounds are normal. There is no distension.     Palpations: Abdomen is soft.     Tenderness: There is no abdominal tenderness.  Musculoskeletal:        General: Normal range of motion.     Cervical back: Neck supple.     Right lower leg: No edema.     Left lower leg: No edema.  Lymphadenopathy:     Cervical: No cervical adenopathy.  Skin:    General: Skin is warm and dry.  Neurological:     Mental Status: She is alert. Mental status is at baseline.  Psychiatric:        Mood and Affect: Mood normal.      ASSESSMENT/ PLAN:  TODAY  Acute metabolic encephalopathy: has returned back to baseline  mentation.   2. Hypotension, unspecified hypotension type: will continue midodrine  5 mg three times daily   3. GERD without esophagitis: will continue prilosec 20 mg twice daily   4. COPD with acute exacerbation: will continue incruse ellipta  62.5 mcg 1 puff daily ; duoneb three times daily and every 4 hours as needed   5. Other migraine without status migrainous not intractable has maxalt  10 mg daily as needed   6. Type 2 diabetes mellitus with peripheral neuropathy: will continue metformin  1 gm twice daily   7. Hyperlipidemia associated with type 2 diabetes mellitus: will continue lipitor 80 mg daily   8. Diabetic peripheral neuropathy: will continue lyrica  150 mg twice daily   9. Severe vascular dementia with other behavioral disturbance weight is 132 pounds  10. Recurrent UTI: is on long term macrobid  50 mg daily and estradiol  twice weekly   11. Anxiety and depression: will continue trintellix  10 mg daily; venlafaxine  er 150 mg daily trazodone  50 mg nightly has buspar  5 mg three times daily   12. Acquired hypothyroidism: will continue synthroid  50 mcg  daily   13. Hypokalemia will continue k+ 10 meq daily   14. Vitamin b 12 deficiency: is on monthly  injections  15. Chronic constipation: will continue miralax  daily as needed  16. Bilateral lower extremity edema: will continue lasix  20 mg daily    Barnie Seip NP [facility]   call [phone]      [1]  Allergies Allergen Reactions   Betadine [Povidone Iodine] Hives   Povidone-Iodine Hives   Penicillins Rash and Dermatitis   "

## 2024-12-18 ENCOUNTER — Encounter: Payer: Self-pay | Admitting: Internal Medicine

## 2024-12-18 ENCOUNTER — Non-Acute Institutional Stay (SKILLED_NURSING_FACILITY): Admitting: Internal Medicine

## 2024-12-18 DIAGNOSIS — E1142 Type 2 diabetes mellitus with diabetic polyneuropathy: Secondary | ICD-10-CM

## 2024-12-18 DIAGNOSIS — E440 Moderate protein-calorie malnutrition: Secondary | ICD-10-CM | POA: Diagnosis not present

## 2024-12-18 DIAGNOSIS — E869 Volume depletion, unspecified: Secondary | ICD-10-CM | POA: Diagnosis not present

## 2024-12-18 DIAGNOSIS — E46 Unspecified protein-calorie malnutrition: Secondary | ICD-10-CM | POA: Insufficient documentation

## 2024-12-18 DIAGNOSIS — F01C18 Vascular dementia, severe, with other behavioral disturbance: Secondary | ICD-10-CM | POA: Diagnosis not present

## 2024-12-18 DIAGNOSIS — G9341 Metabolic encephalopathy: Secondary | ICD-10-CM

## 2024-12-18 DIAGNOSIS — Z7984 Long term (current) use of oral hypoglycemic drugs: Secondary | ICD-10-CM | POA: Diagnosis not present

## 2024-12-18 NOTE — Assessment & Plan Note (Signed)
 While hospitalized 12/16 - 12/16/2024 glucoses ranged from a low of 72 up to high of 269; both were outliers.  Most recent A1c was 6.5% on 11/09/2024, indicating excellent diabetic control.  Monitor glucoses at SNF.

## 2024-12-18 NOTE — Assessment & Plan Note (Signed)
 MMSE  will be performed at the SNF.

## 2024-12-18 NOTE — Assessment & Plan Note (Signed)
 Her baseline he has significant vascular dementia clinically with confabulation and lack of comprehension of hospital events.

## 2024-12-18 NOTE — Assessment & Plan Note (Signed)
 Current albumin 3.2 and total protein 6.0.  Interosseous wasting on exam.  Nutritionist to assess at SNF.

## 2024-12-18 NOTE — Patient Instructions (Signed)
 See assessment and plan under each diagnosis in the problem list and acutely for this visit

## 2024-12-18 NOTE — Progress Notes (Unsigned)
 "   NURSING HOME LOCATION:  [facility] ROOM NUMBER: 126P  CODE STATUS: DNR  PCP: Norleen PEDLAR. [name], MD  This is a comprehensive admission note to this SNFperformed on this date less than 30 days from date of admission. Included are preadmission medical/surgical history; reconciled medication list; family history; social history and comprehensive review of systems.  Corrections and additions to the records were documented. Comprehensive physical exam was also performed. Additionally a clinical summary was entered for each active diagnosis pertinent to this admission in the Problem List to enhance continuity of care.  HPI: She was hospitalized 12/16 - 12/16/2024 presenting with acute metabolic encephalopathy.  Evaluation in the ED did not suggest any infectious etiology; but the patient was validating diarrhea.  C. difficile was negative as well as GI panel.  CT of the head and MRI 12/16 revealed no acute abnormalities.  Present was global parenchymal atrophy with prominent CSF spaces and sulci.  Periventricular subcortical white matter patchy hypodensities consistent with chronic small vessel ischemic changes were noted.  An old right cerebellar infarct was also noted.  Additionally small remote left cerebellar infarct was present.  Postsurgical changes related to the transsphenoidal resection of a pituitary tumor were documented.  MRI confirmed multiple remote lacunar infarcts about the hemispheric cerebral white matter.  There was scattered chronic remote bilateral cerebellar infarcts as well. Clinically dehydration and volume depletion was suspect in the context of diarrhea and decreased p.o. intake.  She did receive IV fluid resuscitation.  Initial BUN was 15 with GFR greater than 60.  Lactic acid was 2.0; repeat value was 1.8.  Albumin was 3.2 total protein 6.0, suggesting protein/caloric malnutrition.  There was progression of her mild anemia with H/H dropping from 11.2/34.2 down to  final value 10.7/33.6.  Peak white count was 13.9; this normalized to 10.1.  Glucoses while hospitalized ranged from a low of 72 up to a high of 269; both were outliers.  The most recent A1c on record was 6.5% on 11/09/2024. Clinically she stabilized and returned to her baseline.  PT/OT consulted and recommended SNF placement for rehab.  Past medical and surgical history: Includes history of asthma; history of cerebral infarction; dyslipidemia; essential hypertension; hypothyroidism; history of migraine headaches; osteoporosis; history of pancreatitis; history of pituitary adenoma; history pituitary adenoma; and diabetes with vascular complications. Surgeries and procedures include abdominal hysterectomy; pituitary adenoma resection; cholecystectomy; laparoscopic nisin fundoplication; and cystoscopy.  Family history: reviewed, non contributory due to advanced age.  Social history: Nondrinker; non-smoker.   Review of systems: Clinical neurocognitive deficits made validity of responses questionable , compromising ROS completion.  When I ask her what diagnoses have been made in the hospital her response was well.Evidently as long as I can under, it does not stay on it.  She does describe a lot of headaches.  When I asked where these were located she placed her palm over the right occiput without further elaboration as to character or duration of headaches.  She does validate depression.  She stated that people tell me I snore.  When asked which people her response was family.  Constitutional: No fever, significant weight change, fatigue  Eyes: No redness, discharge, pain, vision change ENT/mouth: No nasal congestion, purulent discharge, earache, change in hearing, sore throat  Cardiovascular: No chest pain, palpitations, paroxysmal nocturnal dyspnea, claudication, edema  Respiratory: No cough, sputum production, hemoptysis, DOE, significant snoring, apnea Gastrointestinal: No heartburn,  dysphagia, abdominal pain, nausea /vomiting, rectal bleeding, melena, change in bowels  Genitourinary: No dysuria, hematuria, pyuria, incontinence, nocturia Musculoskeletal: No joint stiffness, joint swelling, weakness, pain Dermatologic: No rash, pruritus, change in appearance of skin Neurologic: No dizziness, headache, syncope, seizures, numbness, tingling Psychiatric: No significant anxiety, depression, insomnia, anorexia Endocrine: No change in hair/skin/nails, excessive thirst, excessive hunger, excessive urination  Hematologic/lymphatic: No significant bruising, lymphadenopathy, abnormal bleeding Allergy/immunology: No itchy/watery eyes, significant sneezing, urticaria, angioedema  Physical exam:  Pertinent or positive findings: Initially she was completing a nebulizer treatment while in the room sitting in her wheelchair.  Hair is thin over the crown.  Hair is on.  There is ptosis of the left eye.  As noted she confabulated and queries were followed by long pauses.  She has an intermittent grade 1/2 systolic murmur.  She has minor low-grade rales in the bases, greater on the right than the left.  Pedal pulses are not palpable.  Interosseous wasting is noted.  She has slight osteoarthritic joint changes especially of the left upper extremity compared to the right.  General appearance: Adequately nourished; no acute distress, increased work of breathing is present.   Lymphatic: No lymphadenopathy about the head, neck, axilla. Eyes: No conjunctival inflammation or lid edema is present. There is no scleral icterus. Ears:  External ear exam shows no significant lesions or deformities.   Nose:  External nasal examination shows no deformity or inflammation. Nasal mucosa are pink and moist without lesions, exudates Oral exam: Lips and gums are healthy appearing.There is no oropharyngeal erythema or exudate. Neck:  No thyromegaly, masses, tenderness noted.    Heart:  Normal rate and regular rhythm.  S1 and S2 normal without gallop, murmur, click, rub.  Lungs: Chest clear to auscultation without wheezes, rhonchi, rales, rubs. Abdomen: Bowel sounds are normal.  Abdomen is soft and nontender with no organomegaly, hernias, masses. GU: Deferred  Extremities:  No cyanosis, clubbing, edema. Neurologic exam:  Strength equal  in upper & lower extremities. Balance, Rhomberg, finger to nose testing could not be completed due to clinical state Deep tendon reflexes are equal Skin: Warm & dry w/o tenting. No significant lesions or rash.  See clinical summary under each active problem in the Problem List with associated updated therapeutic plan "

## 2024-12-18 NOTE — Assessment & Plan Note (Signed)
 Clinically volume status is essentially normal at this time.

## 2024-12-21 ENCOUNTER — Other Ambulatory Visit (HOSPITAL_COMMUNITY)
Admission: RE | Admit: 2024-12-21 | Discharge: 2024-12-21 | Disposition: A | Source: Skilled Nursing Facility | Attending: Adult Health | Admitting: Adult Health

## 2024-12-21 DIAGNOSIS — J111 Influenza due to unidentified influenza virus with other respiratory manifestations: Secondary | ICD-10-CM | POA: Insufficient documentation

## 2024-12-21 DIAGNOSIS — U071 COVID-19: Secondary | ICD-10-CM | POA: Insufficient documentation

## 2024-12-21 LAB — RESP PANEL BY RT-PCR (RSV, FLU A&B, COVID)  RVPGX2
Influenza A by PCR: POSITIVE — AB
Influenza B by PCR: NEGATIVE
Resp Syncytial Virus by PCR: NEGATIVE
SARS Coronavirus 2 by RT PCR: NEGATIVE

## 2024-12-22 ENCOUNTER — Emergency Department (HOSPITAL_COMMUNITY)

## 2024-12-22 ENCOUNTER — Inpatient Hospital Stay (HOSPITAL_COMMUNITY)
Admission: EM | Admit: 2024-12-22 | Discharge: 2024-12-25 | DRG: 193 | Disposition: A | Discharge status: Skilled Nursing Facility | Source: Skilled Nursing Facility | Attending: Internal Medicine | Admitting: Internal Medicine

## 2024-12-22 ENCOUNTER — Other Ambulatory Visit: Payer: Self-pay

## 2024-12-22 ENCOUNTER — Encounter (HOSPITAL_COMMUNITY): Payer: Self-pay

## 2024-12-22 DIAGNOSIS — J441 Chronic obstructive pulmonary disease with (acute) exacerbation: Secondary | ICD-10-CM | POA: Diagnosis present

## 2024-12-22 DIAGNOSIS — J101 Influenza due to other identified influenza virus with other respiratory manifestations: Secondary | ICD-10-CM | POA: Diagnosis present

## 2024-12-22 DIAGNOSIS — Z823 Family history of stroke: Secondary | ICD-10-CM

## 2024-12-22 DIAGNOSIS — Z66 Do not resuscitate: Secondary | ICD-10-CM | POA: Diagnosis present

## 2024-12-22 DIAGNOSIS — Z8249 Family history of ischemic heart disease and other diseases of the circulatory system: Secondary | ICD-10-CM

## 2024-12-22 DIAGNOSIS — F0153 Vascular dementia, unspecified severity, with mood disturbance: Secondary | ICD-10-CM | POA: Diagnosis present

## 2024-12-22 DIAGNOSIS — D519 Vitamin B12 deficiency anemia, unspecified: Secondary | ICD-10-CM | POA: Diagnosis present

## 2024-12-22 DIAGNOSIS — K219 Gastro-esophageal reflux disease without esophagitis: Secondary | ICD-10-CM | POA: Diagnosis present

## 2024-12-22 DIAGNOSIS — Z79899 Other long term (current) drug therapy: Secondary | ICD-10-CM

## 2024-12-22 DIAGNOSIS — Z79890 Hormone replacement therapy (postmenopausal): Secondary | ICD-10-CM

## 2024-12-22 DIAGNOSIS — G9341 Metabolic encephalopathy: Secondary | ICD-10-CM | POA: Diagnosis present

## 2024-12-22 DIAGNOSIS — F0150 Vascular dementia without behavioral disturbance: Secondary | ICD-10-CM | POA: Diagnosis present

## 2024-12-22 DIAGNOSIS — M810 Age-related osteoporosis without current pathological fracture: Secondary | ICD-10-CM | POA: Diagnosis present

## 2024-12-22 DIAGNOSIS — E11649 Type 2 diabetes mellitus with hypoglycemia without coma: Secondary | ICD-10-CM | POA: Diagnosis present

## 2024-12-22 DIAGNOSIS — E1142 Type 2 diabetes mellitus with diabetic polyneuropathy: Secondary | ICD-10-CM | POA: Diagnosis present

## 2024-12-22 DIAGNOSIS — Z825 Family history of asthma and other chronic lower respiratory diseases: Secondary | ICD-10-CM

## 2024-12-22 DIAGNOSIS — F32A Depression, unspecified: Secondary | ICD-10-CM | POA: Diagnosis present

## 2024-12-22 DIAGNOSIS — F0154 Vascular dementia, unspecified severity, with anxiety: Secondary | ICD-10-CM | POA: Diagnosis present

## 2024-12-22 DIAGNOSIS — I959 Hypotension, unspecified: Secondary | ICD-10-CM | POA: Diagnosis present

## 2024-12-22 DIAGNOSIS — E1165 Type 2 diabetes mellitus with hyperglycemia: Secondary | ICD-10-CM | POA: Diagnosis not present

## 2024-12-22 DIAGNOSIS — T380X5A Adverse effect of glucocorticoids and synthetic analogues, initial encounter: Secondary | ICD-10-CM | POA: Diagnosis not present

## 2024-12-22 DIAGNOSIS — E039 Hypothyroidism, unspecified: Secondary | ICD-10-CM | POA: Diagnosis present

## 2024-12-22 DIAGNOSIS — Z880 Allergy status to penicillin: Secondary | ICD-10-CM

## 2024-12-22 DIAGNOSIS — Z8673 Personal history of transient ischemic attack (TIA), and cerebral infarction without residual deficits: Secondary | ICD-10-CM

## 2024-12-22 DIAGNOSIS — E16A1 Hypoglycemia level 1: Secondary | ICD-10-CM | POA: Diagnosis present

## 2024-12-22 DIAGNOSIS — Z7984 Long term (current) use of oral hypoglycemic drugs: Secondary | ICD-10-CM | POA: Diagnosis not present

## 2024-12-22 DIAGNOSIS — J9601 Acute respiratory failure with hypoxia: Secondary | ICD-10-CM | POA: Diagnosis present

## 2024-12-22 DIAGNOSIS — Z7982 Long term (current) use of aspirin: Secondary | ICD-10-CM | POA: Diagnosis not present

## 2024-12-22 DIAGNOSIS — E7800 Pure hypercholesterolemia, unspecified: Secondary | ICD-10-CM | POA: Diagnosis present

## 2024-12-22 DIAGNOSIS — G8929 Other chronic pain: Secondary | ICD-10-CM | POA: Diagnosis present

## 2024-12-22 DIAGNOSIS — Z794 Long term (current) use of insulin: Secondary | ICD-10-CM

## 2024-12-22 DIAGNOSIS — I10 Essential (primary) hypertension: Secondary | ICD-10-CM | POA: Diagnosis present

## 2024-12-22 DIAGNOSIS — Z780 Asymptomatic menopausal state: Secondary | ICD-10-CM

## 2024-12-22 DIAGNOSIS — Z9841 Cataract extraction status, right eye: Secondary | ICD-10-CM

## 2024-12-22 DIAGNOSIS — Z90710 Acquired absence of both cervix and uterus: Secondary | ICD-10-CM

## 2024-12-22 DIAGNOSIS — F419 Anxiety disorder, unspecified: Secondary | ICD-10-CM | POA: Diagnosis present

## 2024-12-22 DIAGNOSIS — Z9049 Acquired absence of other specified parts of digestive tract: Secondary | ICD-10-CM

## 2024-12-22 DIAGNOSIS — Z7951 Long term (current) use of inhaled steroids: Secondary | ICD-10-CM

## 2024-12-22 DIAGNOSIS — R0902 Hypoxemia: Secondary | ICD-10-CM

## 2024-12-22 DIAGNOSIS — Z883 Allergy status to other anti-infective agents status: Secondary | ICD-10-CM

## 2024-12-22 LAB — URINALYSIS, W/ REFLEX TO CULTURE (INFECTION SUSPECTED)
Bacteria, UA: NONE SEEN
Bilirubin Urine: NEGATIVE
Glucose, UA: NEGATIVE mg/dL
Hgb urine dipstick: NEGATIVE
Ketones, ur: 5 mg/dL — AB
Leukocytes,Ua: NEGATIVE
Nitrite: NEGATIVE
Protein, ur: NEGATIVE mg/dL
Specific Gravity, Urine: 1.018 (ref 1.005–1.030)
pH: 5 (ref 5.0–8.0)

## 2024-12-22 LAB — CBC WITH DIFFERENTIAL/PLATELET
Abs Immature Granulocytes: 0.07 K/uL (ref 0.00–0.07)
Basophils Absolute: 0.1 K/uL (ref 0.0–0.1)
Basophils Relative: 1 %
Eosinophils Absolute: 0.2 K/uL (ref 0.0–0.5)
Eosinophils Relative: 2 %
HCT: 37 % (ref 36.0–46.0)
Hemoglobin: 11.6 g/dL — ABNORMAL LOW (ref 12.0–15.0)
Immature Granulocytes: 1 %
Lymphocytes Relative: 26 %
Lymphs Abs: 1.9 K/uL (ref 0.7–4.0)
MCH: 28.4 pg (ref 26.0–34.0)
MCHC: 31.4 g/dL (ref 30.0–36.0)
MCV: 90.7 fL (ref 80.0–100.0)
Monocytes Absolute: 0.5 K/uL (ref 0.1–1.0)
Monocytes Relative: 8 %
Neutro Abs: 4.5 K/uL (ref 1.7–7.7)
Neutrophils Relative %: 62 %
Platelets: 368 K/uL (ref 150–400)
RBC: 4.08 MIL/uL (ref 3.87–5.11)
RDW: 16.6 % — ABNORMAL HIGH (ref 11.5–15.5)
WBC: 7.1 K/uL (ref 4.0–10.5)
nRBC: 0 % (ref 0.0–0.2)

## 2024-12-22 LAB — BLOOD GAS, VENOUS
Acid-Base Excess: 3.7 mmol/L — ABNORMAL HIGH (ref 0.0–2.0)
Bicarbonate: 29.7 mmol/L — ABNORMAL HIGH (ref 20.0–28.0)
O2 Saturation: 25.3 %
Patient temperature: 36.5
pCO2, Ven: 48 mmHg (ref 44–60)
pH, Ven: 7.4 (ref 7.25–7.43)
pO2, Ven: <31 mmHg — CL (ref 32–45)

## 2024-12-22 LAB — COMPREHENSIVE METABOLIC PANEL WITH GFR
ALT: 35 U/L (ref 0–44)
AST: 37 U/L (ref 15–41)
Albumin: 3.4 g/dL — ABNORMAL LOW (ref 3.5–5.0)
Alkaline Phosphatase: 65 U/L (ref 38–126)
Anion gap: 16 — ABNORMAL HIGH (ref 5–15)
BUN: 19 mg/dL (ref 8–23)
CO2: 20 mmol/L — ABNORMAL LOW (ref 22–32)
Calcium: 8.2 mg/dL — ABNORMAL LOW (ref 8.9–10.3)
Chloride: 99 mmol/L (ref 98–111)
Creatinine, Ser: 0.68 mg/dL (ref 0.44–1.00)
GFR, Estimated: >60 mL/min
Glucose, Bld: 63 mg/dL — ABNORMAL LOW (ref 70–99)
Potassium: 4 mmol/L (ref 3.5–5.1)
Sodium: 135 mmol/L (ref 135–145)
Total Bilirubin: 0.4 mg/dL (ref 0.0–1.2)
Total Protein: 6.3 g/dL — ABNORMAL LOW (ref 6.5–8.1)

## 2024-12-22 LAB — PRO BRAIN NATRIURETIC PEPTIDE: Pro Brain Natriuretic Peptide: 191 pg/mL

## 2024-12-22 LAB — CBG MONITORING, ED
Glucose-Capillary: 51 mg/dL — ABNORMAL LOW (ref 70–99)
Glucose-Capillary: 64 mg/dL — ABNORMAL LOW (ref 70–99)
Glucose-Capillary: 129 mg/dL — ABNORMAL HIGH (ref 70–99)

## 2024-12-22 LAB — RESP PANEL BY RT-PCR (RSV, FLU A&B, COVID)  RVPGX2
Influenza A by PCR: POSITIVE — AB
Influenza B by PCR: NEGATIVE
Resp Syncytial Virus by PCR: NEGATIVE
SARS Coronavirus 2 by RT PCR: NEGATIVE

## 2024-12-22 LAB — LACTIC ACID, PLASMA: Lactic Acid, Venous: 1 mmol/L (ref 0.5–1.9)

## 2024-12-22 MED ORDER — BUSPIRONE HCL 5 MG PO TABS
5.0000 mg | ORAL_TABLET | Freq: Three times a day (TID) | ORAL | Status: DC | PRN
  Administered 2024-12-24: 5 mg via ORAL

## 2024-12-22 MED ORDER — ONDANSETRON HCL 4 MG PO TABS: 4.0000 mg | ORAL_TABLET | Freq: Four times a day (QID) | ORAL | Status: DC | PRN

## 2024-12-22 MED ORDER — POLYETHYLENE GLYCOL 3350 17 G PO PACK: 17.0000 g | PACK | Freq: Every day | ORAL | Status: DC | PRN

## 2024-12-22 MED ORDER — MIDODRINE HCL 5 MG PO TABS
5.0000 mg | ORAL_TABLET | ORAL | Status: AC
  Administered 2024-12-22: 5 mg via ORAL
  Filled 2024-12-22: qty 1

## 2024-12-22 MED ORDER — HEPARIN SODIUM (PORCINE) 5000 UNIT/ML IJ SOLN
5000.0000 [IU] | Freq: Three times a day (TID) | INTRAMUSCULAR | Status: DC
  Administered 2024-12-22 – 2024-12-25 (×9): 5000 [IU] via SUBCUTANEOUS
  Filled 2024-12-22 (×4): qty 1

## 2024-12-22 MED ORDER — DEXTROSE 50 % IV SOLN
INTRAVENOUS | Status: AC
  Filled 2024-12-22: qty 50

## 2024-12-22 MED ORDER — LACTATED RINGERS IV BOLUS (SEPSIS)
1000.0000 mL | Freq: Once | INTRAVENOUS | Status: AC
  Administered 2024-12-22: 1000 mL via INTRAVENOUS

## 2024-12-22 MED ORDER — VORTIOXETINE HBR 10 MG PO TABS
10.0000 mg | ORAL_TABLET | Freq: Every day | ORAL | Status: DC
  Administered 2024-12-23 – 2024-12-25 (×3): 10 mg via ORAL
  Filled 2024-12-22 (×2): qty 1

## 2024-12-22 MED ORDER — SODIUM CHLORIDE 0.9% FLUSH
3.0000 mL | Freq: Two times a day (BID) | INTRAVENOUS | Status: DC
  Administered 2024-12-22 – 2024-12-25 (×6): 3 mL via INTRAVENOUS

## 2024-12-22 MED ORDER — SODIUM CHLORIDE 0.9% FLUSH: 3.0000 mL | INTRAVENOUS | Status: DC | PRN

## 2024-12-22 MED ORDER — TRAZODONE HCL 50 MG PO TABS
50.0000 mg | ORAL_TABLET | Freq: Every evening | ORAL | Status: DC | PRN
  Administered 2024-12-24: 50 mg via ORAL

## 2024-12-22 MED ORDER — IPRATROPIUM-ALBUTEROL 0.5-2.5 (3) MG/3ML IN SOLN
3.0000 mL | Freq: Three times a day (TID) | RESPIRATORY_TRACT | Status: DC
  Administered 2024-12-23 – 2024-12-25 (×8): 3 mL via RESPIRATORY_TRACT

## 2024-12-22 MED ORDER — ONDANSETRON HCL 4 MG/2ML IJ SOLN: 4.0000 mg | Freq: Four times a day (QID) | INTRAMUSCULAR | Status: DC | PRN

## 2024-12-22 MED ORDER — PANTOPRAZOLE SODIUM 40 MG PO TBEC
40.0000 mg | DELAYED_RELEASE_TABLET | Freq: Every day | ORAL | Status: DC
  Administered 2024-12-22 – 2024-12-25 (×4): 40 mg via ORAL
  Filled 2024-12-22: qty 1

## 2024-12-22 MED ORDER — IPRATROPIUM-ALBUTEROL 0.5-2.5 (3) MG/3ML IN SOLN
6.0000 mL | Freq: Once | RESPIRATORY_TRACT | Status: AC
  Administered 2024-12-22: 6 mL via RESPIRATORY_TRACT
  Filled 2024-12-22: qty 3

## 2024-12-22 MED ORDER — METHYLPREDNISOLONE SODIUM SUCC 40 MG IJ SOLR
40.0000 mg | Freq: Two times a day (BID) | INTRAMUSCULAR | Status: DC
  Administered 2024-12-22 – 2024-12-25 (×6): 40 mg via INTRAVENOUS
  Filled 2024-12-22: qty 1

## 2024-12-22 MED ORDER — CYCLOSPORINE 0.05 % OP EMUL
1.0000 [drp] | Freq: Two times a day (BID) | OPHTHALMIC | Status: DC
  Administered 2024-12-22 – 2024-12-25 (×6): 1 [drp] via OPHTHALMIC
  Filled 2024-12-22: qty 30

## 2024-12-22 MED ORDER — ATORVASTATIN CALCIUM 40 MG PO TABS
80.0000 mg | ORAL_TABLET | Freq: Every day | ORAL | Status: DC
  Administered 2024-12-22 – 2024-12-25 (×4): 80 mg via ORAL
  Filled 2024-12-22: qty 2

## 2024-12-22 MED ORDER — FLUTICASONE FUROATE-VILANTEROL 200-25 MCG/ACT IN AEPB
1.0000 | INHALATION_SPRAY | Freq: Every day | RESPIRATORY_TRACT | Status: DC
  Administered 2024-12-22 – 2024-12-25 (×4): 1 via RESPIRATORY_TRACT
  Filled 2024-12-22: qty 28

## 2024-12-22 MED ORDER — IPRATROPIUM-ALBUTEROL 0.5-2.5 (3) MG/3ML IN SOLN
3.0000 mL | Freq: Four times a day (QID) | RESPIRATORY_TRACT | Status: DC
  Administered 2024-12-22: 3 mL via RESPIRATORY_TRACT
  Filled 2024-12-22: qty 3

## 2024-12-22 MED ORDER — MIDODRINE HCL 5 MG PO TABS
10.0000 mg | ORAL_TABLET | Freq: Three times a day (TID) | ORAL | Status: DC
  Administered 2024-12-22 – 2024-12-25 (×10): 10 mg via ORAL
  Filled 2024-12-22 (×5): qty 2

## 2024-12-22 MED ORDER — UMECLIDINIUM BROMIDE 62.5 MCG/ACT IN AEPB
1.0000 | INHALATION_SPRAY | Freq: Every day | RESPIRATORY_TRACT | Status: DC
  Administered 2024-12-22 – 2024-12-25 (×4): 1 via RESPIRATORY_TRACT
  Filled 2024-12-22: qty 7

## 2024-12-22 MED ORDER — DEXTROSE 50 % IV SOLN
50.0000 mL | Freq: Once | INTRAVENOUS | Status: AC
  Administered 2024-12-22: 50 mL via INTRAVENOUS

## 2024-12-22 MED ORDER — LEVOTHYROXINE SODIUM 50 MCG PO TABS
50.0000 ug | ORAL_TABLET | Freq: Every day | ORAL | Status: DC
  Administered 2024-12-23 – 2024-12-25 (×3): 50 ug via ORAL

## 2024-12-22 MED ORDER — LACTATED RINGERS IV SOLN: INTRAVENOUS | Status: AC

## 2024-12-22 MED ORDER — ASPIRIN 81 MG PO TBEC
81.0000 mg | DELAYED_RELEASE_TABLET | Freq: Every day | ORAL | Status: DC
  Administered 2024-12-23 – 2024-12-25 (×3): 81 mg via ORAL
  Filled 2024-12-22: qty 1

## 2024-12-22 MED ORDER — BISACODYL 10 MG RE SUPP: 10.0000 mg | Freq: Every day | RECTAL | Status: DC | PRN

## 2024-12-22 MED ORDER — METHYLPREDNISOLONE SODIUM SUCC 125 MG IJ SOLR
125.0000 mg | Freq: Once | INTRAMUSCULAR | Status: AC
  Administered 2024-12-22: 125 mg via INTRAVENOUS
  Filled 2024-12-22: qty 2

## 2024-12-22 MED ORDER — ACETAMINOPHEN 325 MG PO TABS: 650.0000 mg | ORAL_TABLET | Freq: Four times a day (QID) | ORAL | Status: DC | PRN

## 2024-12-22 MED ORDER — ACETAMINOPHEN 650 MG RE SUPP: 650.0000 mg | Freq: Four times a day (QID) | RECTAL | Status: DC | PRN

## 2024-12-22 MED ORDER — VANCOMYCIN HCL IN DEXTROSE 1-5 GM/200ML-% IV SOLN
1000.0000 mg | Freq: Once | INTRAVENOUS | Status: AC
  Administered 2024-12-22: 1000 mg via INTRAVENOUS
  Filled 2024-12-22: qty 200

## 2024-12-22 MED ORDER — ALBUTEROL SULFATE (2.5 MG/3ML) 0.083% IN NEBU: 2.5000 mg | INHALATION_SOLUTION | RESPIRATORY_TRACT | Status: DC | PRN

## 2024-12-22 MED ORDER — SODIUM CHLORIDE 0.9 % IV SOLN: INTRAVENOUS | Status: AC | PRN

## 2024-12-22 MED ORDER — OSELTAMIVIR PHOSPHATE 75 MG PO CAPS
75.0000 mg | ORAL_CAPSULE | Freq: Two times a day (BID) | ORAL | Status: DC
  Administered 2024-12-22 – 2024-12-25 (×6): 75 mg via ORAL
  Filled 2024-12-22: qty 1

## 2024-12-22 MED ORDER — SODIUM CHLORIDE 0.9 % IV SOLN
2.0000 g | Freq: Once | INTRAVENOUS | Status: AC
  Administered 2024-12-22: 2 g via INTRAVENOUS
  Filled 2024-12-22: qty 12.5

## 2024-12-22 MED ORDER — PREGABALIN 75 MG PO CAPS
150.0000 mg | ORAL_CAPSULE | Freq: Two times a day (BID) | ORAL | Status: DC
  Administered 2024-12-22 – 2024-12-25 (×7): 150 mg via ORAL
  Filled 2024-12-22 (×4): qty 2

## 2024-12-22 MED ORDER — OSELTAMIVIR PHOSPHATE 75 MG PO CAPS
75.0000 mg | ORAL_CAPSULE | ORAL | Status: AC
  Administered 2024-12-22: 75 mg via ORAL
  Filled 2024-12-22: qty 1

## 2024-12-22 MED ORDER — RIZATRIPTAN BENZOATE 10 MG PO TBDP: 10.0000 mg | ORAL_TABLET | ORAL | Status: DC | PRN

## 2024-12-22 MED ORDER — INSULIN ASPART 100 UNIT/ML IJ SOLN
0.0000 [IU] | Freq: Three times a day (TID) | INTRAMUSCULAR | Status: DC
  Administered 2024-12-23 (×3): 2 [IU] via SUBCUTANEOUS
  Administered 2024-12-24 (×2): 1 [IU] via SUBCUTANEOUS
  Administered 2024-12-24 – 2024-12-25 (×2): 4 [IU] via SUBCUTANEOUS
  Administered 2024-12-25: 2 [IU] via SUBCUTANEOUS

## 2024-12-22 MED ORDER — VENLAFAXINE HCL 37.5 MG PO TABS
75.0000 mg | ORAL_TABLET | Freq: Two times a day (BID) | ORAL | Status: DC
  Administered 2024-12-22 – 2024-12-25 (×6): 75 mg via ORAL
  Filled 2024-12-22: qty 2

## 2024-12-22 MED ORDER — DM-GUAIFENESIN ER 30-600 MG PO TB12
1.0000 | ORAL_TABLET | Freq: Two times a day (BID) | ORAL | Status: DC
  Administered 2024-12-22 – 2024-12-25 (×7): 1 via ORAL
  Filled 2024-12-22 (×2): qty 1

## 2024-12-22 NOTE — ED Notes (Signed)
 4 ounces of juice given for cbg of 64

## 2024-12-22 NOTE — H&P (Signed)
 " History and Physical    Patient: [name] White FMW:[id number] DOB: October 01, 1941 DOA: 12/22/2024 DOS: the patient was seen and examined on 12/22/2024 PCP: [name], MD  Patient coming from: [facility]  Chief Complaint:  Chief Complaint  Patient presents with   Shortness of Breath   HPI: [name] White is a 83 y.o. female with medical history significant DM2,  HTN, hypothyroidism, vascular dementia, malnutrition, history of CVAs, asthma, chronic anemia and B12 deficiency, presenting from [facility] with altered mentation and increasing oxygen requirement in the setting of influenza A infection -Patient is a poor historian, so history is somewhat limited -In ED patient was found to be transiently hypotensive, Bp was 70/40s and hr 90,  required IV fluids at baseline she takes midodrine  -In the ED blood sugar was found to be 64 -Patient does deny chest pains and denies vomiting -No fevers here in the ED -CT head today in ED without new acute intracranial abnormalities -Chest x-ray without new acute findings small left-sided pleural effusion noted - UA is not suggestive of UTI -VBG without CO2 retention - proBNP 191 - WBC 7.3, lactic acid 1.0,  hemoglobin 11.6 which is close to baseline, platelets 368 -- Sodium 135, sodium 4.0, creatinine 0.68 BUN 19, LFTs are not elevated - Flu A+, RSV and COVID-negative  Review of Systems: unable to review all systems due to the inability of the patient to answer questions. Past Medical History:  Diagnosis Date   Anemia    Anxiety    Asthma    ASYMPTOMATIC POSTMENOPAUSAL STATUS 02/09/2009   Cataract    Cerebral infarction [facility])    Constipation 03/19/2013   Depression    DIABETES MELLITUS, TYPE II 09/14/2007   HYPERCHOLESTEROLEMIA 02/09/2009   HYPERTENSION 02/09/2009   HYPOTHYROIDISM 09/14/2007   Metabolic encephalopathy    MIGRAINE HEADACHE 09/14/2007   Neuropathy    OSTEOPOROSIS 02/09/2009   PANCREATITIS 09/14/2007   PITUITARY  ADENOMA 09/14/2007   PONV (postoperative nausea and vomiting)    Protein calorie malnutrition    Rectocele 03/19/2013   Shortness of breath    SUPERFICIAL PHLEBITIS 09/14/2007   Varicose veins    Past Surgical History:  Procedure Laterality Date   ABDOMINAL HYSTERECTOMY     ANTERIOR AND POSTERIOR REPAIR N/A 04/02/2013   Procedure: ANTERIOR (CYSTOCELE) AND POSTERIOR REPAIR (RECTOCELE);  Surgeon: Norleen LULLA Server, MD;  Location: [facility];  Service: Gynecology;  Laterality: N/A;   APPENDECTOMY     BRAIN SURGERY     CATARACT EXTRACTION W/PHACO  12/05/2011   Procedure: CATARACT EXTRACTION PHACO AND INTRAOCULAR LENS PLACEMENT (IOC);  Surgeon: Cherene Mania;  Location: [facility];  Service: Ophthalmology;  Laterality: Left;  CDE:9.65   CHOLECYSTECTOMY     CRANIOTOMY N/A 06/04/2020   Procedure: Endoscopic Transphenoidal Resection of Recurrent PituitaryTumor;  Surgeon: Colon Shove, MD;  Location: [facility] OR;  Service: Neurosurgery;  Laterality: N/A;   CYSTOSCOPY     ENDOVENOUS ABLATION SAPHENOUS VEIN W/ LASER  11-03-2011   right greater saphenous vein   left leg done 10-2011   EYE SURGERY  98   right cataract extraction 98   LAPAROSCOPIC NISSEN FUNDOPLICATION     NM ESOPHAGEAL REFLUX  08-11-11   PITUITARY EXCISION  10/1997   POSTERIOR REPAIR     TRANSNASAL APPROACH N/A 06/04/2020   Procedure: TRANSNASAL APPROACH;  Surgeon: Mable Lenis, MD;  Location: [facility] OR;  Service: ENT;  Laterality: N/A;   TRANSPHENOIDAL / TRANSNASAL HYPOPHYSECTOMY / RESECTION PITUITARY TUMOR  08-11-11   Social History:  reports that she has never smoked. She has never used smokeless tobacco. She reports that she does not drink alcohol and does not use drugs.  Allergies[1]  Family History  Problem Relation Age of Onset   Heart disease Mother    Asthma Mother    COPD Father    Heart disease Father    Stroke Father    Asthma Daughter    Migraines Daughter    Neuropathy Son    Cancer Neg Hx    Anesthesia problems Neg Hx     Hypotension Neg Hx    Malignant hyperthermia Neg Hx    Pseudochol deficiency Neg Hx     Prior to Admission medications  Medication Sig Start Date End Date Taking? Authorizing Provider  acetaminophen  (TYLENOL ) 325 MG tablet Take 2 tablets (650 mg total) by mouth every 6 (six) hours as needed for mild pain (pain score 1-3) (or Fever >/= 101). 01/01/24  Yes Shahmehdi, Adriana LABOR, MD  albuterol  (VENTOLIN  HFA) 108 (90 Base) MCG/ACT inhaler Inhale 2 puffs into the lungs every 4 (four) hours as needed for wheezing or shortness of breath. 05/01/24  Yes Johnson, Clanford L, MD  aspirin  EC 81 MG tablet Take 81 mg by mouth every morning. Swallow whole.   Yes [provider]  atorvastatin  (LIPITOR) 80 MG tablet Take 80 mg by mouth every morning.   Yes [provider]  busPIRone  (BUSPAR ) 5 MG tablet Take 5 mg by mouth 3 (three) times daily as needed (anxiety).   Yes [provider]  cyanocobalamin  (VITAMIN B12) 1000 MCG/ML injection Inject 1,000 mcg into the muscle every 30 (thirty) days. Given on the 23rd of each month 07/26/24  Yes [provider]  cycloSPORINE  (RESTASIS ) 0.05 % ophthalmic emulsion Place 1 drop into both eyes 2 (two) times daily. Given at 0900 and 2100   Yes [provider]  Estradiol  (IMVEXXY  MAINTENANCE PACK) 10 MCG INST Place 1 suppository vaginally 2 (two) times a week. Patient taking differently: Place 1 suppository vaginally 2 (two) times a week. Mondays and Thursdays 10/14/24  Yes [name] POUR, MD  fluticasone  furoate-vilanterol (BREO ELLIPTA ) 200-25 MCG/ACT AEPB Inhale 1 puff into the lungs daily. 10/16/24  Yes Ricky Fines, MD  furosemide  (LASIX ) 20 MG tablet Take 1 tablet (20 mg total) by mouth daily. 11/12/24 11/12/25 Yes Tat, Alm, MD  ipratropium-albuterol  (DUONEB) 0.5-2.5 (3) MG/3ML SOLN Take 3 mLs by nebulization every 4 (four) hours as needed (wheezing and SOB). Patient taking differently: Take 3 mLs by nebulization every 4  (four) hours as needed (wheezing and shortness of breath). 10/16/24  Yes Ricky Fines, MD  ipratropium-albuterol  (DUONEB) 0.5-2.5 (3) MG/3ML SOLN Take 3 mLs by nebulization 3 (three) times daily. Given at 0900, 1200 and 2100   Yes [provider]  levothyroxine  (SYNTHROID ) 50 MCG tablet Take 50 mcg by mouth daily before breakfast.   Yes [provider]  metFORMIN  (GLUCOPHAGE ) 1000 MG tablet Take 1,000 mg by mouth 2 (two) times daily. Given at 0800 and 1800 10/01/24  Yes [provider]  midodrine  (PROAMATINE ) 5 MG tablet Take 5 mg by mouth 3 (three) times daily with meals. Given at 0800, 1200 and 1800 06/05/24  Yes [provider]  Multiple Vitamins-Minerals (CENTRUM SILVER PO) Take 1 tablet by mouth every morning.   Yes [provider]  nitrofurantoin  (MACRODANTIN ) 50 MG capsule Take 1 capsule (50 mg total) by mouth at bedtime. 11/27/24  Yes McKenzie, Belvie CROME,  MD  omeprazole  (PRILOSEC) 20 MG capsule Take 1 capsule (20 mg total) by mouth 2 (two) times daily before a meal. 10/16/24  Yes Ricky Fines, MD  oseltamivir  (TAMIFLU ) 30 MG capsule Take 30 mg by mouth 2 (two) times daily. Given at 0900 and 2100   Yes [provider]  polyethylene glycol (MIRALAX  / GLYCOLAX ) 17 g packet Take 17 g by mouth daily as needed for mild constipation. 05/01/24  Yes Johnson, Clanford L, MD  potassium chloride  (KLOR-CON ) 10 MEQ tablet Take 10 mEq by mouth every morning. 05/15/24  Yes [provider]  pregabalin  (LYRICA ) 150 MG capsule Take 1 capsule (150 mg total) by mouth 2 (two) times daily. Patient taking differently: Take 150 mg by mouth 2 (two) times daily. Given at 0900 and 2100 12/17/24  Yes Landy Barnie RAMAN, NP  rizatriptan  (MAXALT ) 10 MG tablet Take 10 mg by mouth daily as needed for migraine. 09/12/24  Yes [provider]  tiZANidine  (ZANAFLEX ) 2 MG tablet Take 2 mg by mouth 2 (two) times daily as needed for muscle spasms. 06/21/24  Yes  [provider]  traZODone  (DESYREL ) 50 MG tablet Take 1 tablet (50 mg total) by mouth at bedtime as needed for sleep. 03/19/24  Yes Anaria Kroner, MD  TRINTELLIX  10 MG TABS tablet Take 10 mg by mouth every morning. 03/09/24  Yes [provider]  umeclidinium bromide  (INCRUSE ELLIPTA ) 62.5 MCG/ACT AEPB Inhale 1 puff into the lungs daily. 10/16/24  Yes Ricky Fines, MD  venlafaxine  (EFFEXOR -XR) 150 MG 24 hr capsule Take 150 mg by mouth every morning.   Yes [provider]   Physical Exam: Vitals:   12/22/24 1230 12/22/24 1447 12/22/24 1603 12/22/24 1656  BP: 106/61   (!) 91/58  Pulse: 87   81  Resp: 19     Temp:  97.7 F (36.5 C)  97.9 F (36.6 C)  TempSrc:  Oral    SpO2: 92%  94% 100%  Weight:    60.3 kg  Height:    5' 6 (1.676 m)    Physical Exam Gen:- Awake Alert, in no acute distress  HEENT:- [place].AT, No sclera icterus Nose- [place] 3L/min Neck-Supple Neck,No JVD,.  Lungs-diminished breath sounds, few scattered wheezes CV- S1, S2 normal, RRR Abd-  +ve B.Sounds, Abd Soft, No tenderness,    Extremity/Skin:- No  edema,   good pedal pulses  Psych-affect is flat, oriented x3 Neuro-generalized weakness, no new focal deficits, no tremors  Data Reviewed:  -CT head today in ED without new acute intracranial abnormalities -Chest x-ray without new acute findings small left-sided pleural effusion noted - UA is not suggestive of UTI -VBG without CO2 retention - proBNP 191 - WBC 7.3, lactic acid 1.0,  hemoglobin 11.6 which is close to baseline, platelets 368 -- Sodium 135, sodium 4.0, creatinine 0.68 BUN 19, LFTs are not elevated - Flu A+, RSV and COVID-negative  Assessment and Plan: 1) influenza A infection--- very symptomatic with hypoxia - Give Tamiflu , bronchodilators and mucolytic's  2) acute hypoxic respiratory failure due to 1 above - Currently requiring 2 to 3 L of oxygen via nasal cannula - Should improve with management of #1 number  3) acute  metabolic encephalopathy--due to #1 above -CT head without contrast shows no new acute intracranial findings -Anticipate improvement with improvement in #1 above - 3) acute metabolic encephalopathy--- due to #1 and #2 above - CT head without acute finding - Anticipate improvement in mentation with improvement in #1 or 2 above -  Patient's daughter Olam requested that we restart patient's Lyrica  as she believes that stopping the Lyrica  will actually make patient mental status deteriorate   4) acute COPD exacerbation with acute hypoxic respiratory failure--- due to 1 above -IV Solu-Medrol , bronchodilators and mucolytics as ordered -Try to wean off O2 as able   5)DM2-recent A1c 6.5 reflecting good diabetic control PTA -Hold PTA metformin  -Anticipate worsening hyperglycemia with steroid Use Novolog /Humalog Sliding scale insulin  with Accu-Cheks/Fingersticks as ordered   6)GERD--continue Protonix  especially while on high-dose steroid   7) history of prior strokes--no new strokelike symptoms at this time  -continue aspirin  and atorvastatin  for secondary prevention   8)Depression/history of vascular dementia-- currently with metabolic encephalopathy due to 1 above - Continue Effexor  and Trintellix   9)Hypothyroidism--continue levothyroxine   10) chronic pain/neuropathy--- continue Lyrica   11)SoFT BP at baseline--- -In ED patient was found to be transiently hypotensive, Bp was 70/40s and hr 90,  required IV fluids at baseline she takes midodrine , continue midodrine   12) chronic anemia/B12 deficiency-- hemoglobin 11.6 which is close to baseline   Advance Care Planning:   Code Status: Limited: Do not attempt resuscitation (DNR) -DNR-LIMITED -Do Not Intubate/DNI    Severity of Illness: The appropriate patient status for this patient is INPATIENT. Inpatient status is judged to be reasonable and necessary in order to provide the required intensity of service to ensure the patient's safety. The  patient's presenting symptoms, physical exam findings, and initial radiographic and laboratory data in the context of their chronic comorbidities is felt to place them at high risk for further clinical deterioration. Furthermore, it is not anticipated that the patient will be medically stable for discharge from the hospital within 2 midnights of admission.   * I certify that at the point of admission it is my clinical judgment that the patient will require inpatient hospital care spanning beyond 2 midnights from the point of admission due to high intensity of service, high risk for further deterioration and high frequency of surveillance required.*  Author: Rendall Carwin, MD 12/22/2024 6:25 PM  For on call review [email or web address].      [1]  Allergies Allergen Reactions   Betadine [Povidone Iodine] Hives   Povidone-Iodine Hives   Penicillins Rash and Dermatitis   "

## 2024-12-22 NOTE — ED Provider Notes (Signed)
 " [place] EMERGENCY DEPARTMENT AT [facility] Provider Note   CSN: [id number] Arrival date & time: 12/22/24  9263     Patient presents with: Shortness of Breath   [name] White is a 83 y.o. female.   83 year old female history of anemia, anxiety, asthma, depression, diabetes, and residence at the [facility] who presents to the emergency department for altered mental status.  Said several presentations for this in the past few months.  Also has had shortness of breath and increased confusion.  Placed on 3 L nasal cannula due to hypoxia.  Also was noticed to be hypotensive but does have a history of this and is on midodrine .  Called the [facility] but unable to reach him for additional information.  Patient unable to provide additional history at this time       Prior to Admission medications  Medication Sig Start Date End Date Taking? Authorizing Provider  acetaminophen  (TYLENOL ) 325 MG tablet Take 2 tablets (650 mg total) by mouth every 6 (six) hours as needed for mild pain (pain score 1-3) (or Fever >/= 101). 01/01/24  Yes Shahmehdi, Adriana LABOR, MD  albuterol  (VENTOLIN  HFA) 108 (90 Base) MCG/ACT inhaler Inhale 2 puffs into the lungs every 4 (four) hours as needed for wheezing or shortness of breath. 05/01/24  Yes Johnson, Clanford L, MD  aspirin  EC 81 MG tablet Take 81 mg by mouth every morning. Swallow whole.   Yes [provider]  atorvastatin  (LIPITOR) 80 MG tablet Take 80 mg by mouth every morning.   Yes [provider]  busPIRone  (BUSPAR ) 5 MG tablet Take 5 mg by mouth 3 (three) times daily as needed (anxiety).   Yes [provider]  cyanocobalamin  (VITAMIN B12) 1000 MCG/ML injection Inject 1,000 mcg into the muscle every 30 (thirty) days. Given on the 23rd of each month 07/26/24  Yes [provider]  cycloSPORINE  (RESTASIS ) 0.05 % ophthalmic emulsion Place 1 drop into both eyes 2 (two) times daily. Given at 0900 and 2100   Yes [provider]  Estradiol  (IMVEXXY  MAINTENANCE PACK) 10 MCG INST Place 1 suppository vaginally 2 (two) times a week. Patient taking differently: Place 1 suppository vaginally 2 (two) times a week. Mondays and Thursdays 10/14/24  Yes [name] POUR, MD  fluticasone  furoate-vilanterol (BREO ELLIPTA ) 200-25 MCG/ACT AEPB Inhale 1 puff into the lungs daily. 10/16/24  Yes Ricky Fines, MD  furosemide  (LASIX ) 20 MG tablet Take 1 tablet (20 mg total) by mouth daily. 11/12/24 11/12/25 Yes Tat, Alm, MD  ipratropium-albuterol  (DUONEB) 0.5-2.5 (3) MG/3ML SOLN Take 3 mLs by nebulization every 4 (four) hours as needed (wheezing and SOB). Patient taking differently: Take 3 mLs by nebulization every 4 (four) hours as needed (wheezing and shortness of breath). 10/16/24  Yes Ricky Fines, MD  ipratropium-albuterol  (DUONEB) 0.5-2.5 (3) MG/3ML SOLN Take 3 mLs by nebulization 3 (three) times daily. Given at 0900, 1200 and 2100   Yes [provider]  levothyroxine  (SYNTHROID ) 50 MCG tablet Take 50 mcg by mouth daily before breakfast.   Yes [provider]  metFORMIN  (GLUCOPHAGE ) 1000 MG tablet Take 1,000 mg by mouth 2 (two) times daily. Given at 0800 and 1800 10/01/24  Yes [provider]  midodrine  (PROAMATINE ) 5 MG tablet Take 5 mg by mouth 3 (three) times daily with meals. Given at 0800, 1200 and 1800 06/05/24  Yes [provider]  Multiple Vitamins-Minerals (CENTRUM SILVER PO) Take 1 tablet by mouth every morning.  Yes [provider]  nitrofurantoin  (MACRODANTIN ) 50 MG capsule Take 1 capsule (50 mg total) by mouth at bedtime. 11/27/24  Yes McKenzie, Belvie CROME, MD  omeprazole  (PRILOSEC) 20 MG capsule Take 1 capsule (20 mg total) by mouth 2 (two) times daily before a meal. 10/16/24  Yes Ricky Fines, MD  oseltamivir  (TAMIFLU ) 30 MG capsule Take 30 mg by mouth 2 (two) times daily. Given at 0900 and 2100   Yes [provider]  polyethylene glycol (MIRALAX  /  GLYCOLAX ) 17 g packet Take 17 g by mouth daily as needed for mild constipation. 05/01/24  Yes Johnson, Clanford L, MD  potassium chloride  (KLOR-CON ) 10 MEQ tablet Take 10 mEq by mouth every morning. 05/15/24  Yes [provider]  pregabalin  (LYRICA ) 150 MG capsule Take 1 capsule (150 mg total) by mouth 2 (two) times daily. Patient taking differently: Take 150 mg by mouth 2 (two) times daily. Given at 0900 and 2100 12/17/24  Yes Landy Barnie RAMAN, NP  rizatriptan  (MAXALT ) 10 MG tablet Take 10 mg by mouth daily as needed for migraine. 09/12/24  Yes [provider]  tiZANidine  (ZANAFLEX ) 2 MG tablet Take 2 mg by mouth 2 (two) times daily as needed for muscle spasms. 06/21/24  Yes [provider]  traZODone  (DESYREL ) 50 MG tablet Take 1 tablet (50 mg total) by mouth at bedtime as needed for sleep. 03/19/24  Yes Emokpae, Courage, MD  TRINTELLIX  10 MG TABS tablet Take 10 mg by mouth every morning. 03/09/24  Yes [provider]  umeclidinium bromide  (INCRUSE ELLIPTA ) 62.5 MCG/ACT AEPB Inhale 1 puff into the lungs daily. 10/16/24  Yes Ricky Fines, MD  venlafaxine  (EFFEXOR -XR) 150 MG 24 hr capsule Take 150 mg by mouth every morning.   Yes [provider]    Allergies: Betadine [povidone iodine], Povidone-iodine, and Penicillins    Review of Systems  Updated Vital Signs BP (!) 91/58 (BP Location: Right Arm)   Pulse 81   Temp 97.9 F (36.6 C)   Resp 19   Ht 5' 6 (1.676 m)   Wt 60.3 kg   SpO2 100%   BMI 21.46 kg/m   Physical Exam Vitals and nursing note reviewed.  Constitutional:      General: She is not in acute distress.    Appearance: She is well-developed.     Comments: On 3 L nasal cannula.  Not in respiratory distress.  Alert and oriented to self only unclear if this is her baseline.  HENT:     Head: Normocephalic and atraumatic.     Right Ear: External ear normal.     Left Ear: External ear normal.     Nose: Nose normal.  Eyes:      Extraocular Movements: Extraocular movements intact.     Conjunctiva/sclera: Conjunctivae normal.     Pupils: Pupils are equal, round, and reactive to light.  Cardiovascular:     Rate and Rhythm: Normal rate and regular rhythm.     Heart sounds: No murmur heard. Pulmonary:     Effort: Pulmonary effort is normal. No respiratory distress.     Breath sounds: Rhonchi (Left greater than right) present.     Comments: On 3 L nasal cannula Abdominal:     General: Abdomen is flat. There is no distension.     Palpations: Abdomen is soft. There is no mass.     Tenderness: There is no abdominal tenderness. There is no guarding.  Musculoskeletal:     Cervical back: Normal range  of motion and neck supple.     Right lower leg: No edema.     Left lower leg: No edema.     Comments: No sacral ulcer or ulcer of the heel  Skin:    General: Skin is warm and dry.  Neurological:     Mental Status: She is alert. Mental status is at baseline.     Cranial Nerves: No cranial nerve deficit.     Sensory: No sensory deficit.     Motor: No weakness.  Psychiatric:        Mood and Affect: Mood normal.     (all labs ordered are listed, but only abnormal results are displayed) Labs Reviewed  RESP PANEL BY RT-PCR (RSV, FLU A&B, COVID)  RVPGX2 - Abnormal; Notable for the following components:      Result Value   Influenza A by PCR POSITIVE (*)    All other components within normal limits  COMPREHENSIVE METABOLIC PANEL WITH GFR - Abnormal; Notable for the following components:   CO2 20 (*)    Glucose, Bld 63 (*)    Calcium  8.2 (*)    Total Protein 6.3 (*)    Albumin 3.4 (*)    Anion gap 16 (*)    All other components within normal limits  CBC WITH DIFFERENTIAL/PLATELET - Abnormal; Notable for the following components:   Hemoglobin 11.6 (*)    RDW 16.6 (*)    All other components within normal limits  BLOOD GAS, VENOUS - Abnormal; Notable for the following components:   pO2, Ven <31 (*)    Bicarbonate  29.7 (*)    Acid-Base Excess 3.7 (*)    All other components within normal limits  URINALYSIS, W/ REFLEX TO CULTURE (INFECTION SUSPECTED) - Abnormal; Notable for the following components:   Ketones, ur 5 (*)    All other components within normal limits  CBG MONITORING, ED - Abnormal; Notable for the following components:   Glucose-Capillary 64 (*)    All other components within normal limits  CBG MONITORING, ED - Abnormal; Notable for the following components:   Glucose-Capillary 51 (*)    All other components within normal limits  CBG MONITORING, ED - Abnormal; Notable for the following components:   Glucose-Capillary 129 (*)    All other components within normal limits  CULTURE, BLOOD (ROUTINE X 2)  CULTURE, BLOOD (ROUTINE X 2)  LACTIC ACID, PLASMA  PRO BRAIN NATRIURETIC PEPTIDE  I-STAT CHEM 8, ED    EKG: EKG Interpretation Date/Time:  Sunday December 22 2024 08:04:35 EST Ventricular Rate:  86 PR Interval:  141 QRS Duration:  92 QT Interval:  403 QTC Calculation: 482 R Axis:   88  Text Interpretation: Sinus rhythm Borderline right axis deviation Confirmed by Yolande Charleston [phone]) on 12/22/2024 8:42:30 AM  Radiology: CT Head Wo Contrast Result Date: 12/22/2024 EXAM: CT HEAD WITHOUT CONTRAST 12/22/2024 09:27:18 AM TECHNIQUE: CT of the head was performed without the administration of intravenous contrast. Automated exposure control, iterative reconstruction, and/or weight based adjustment of the mA/kV was utilized to reduce the radiation dose to as low as reasonably achievable. COMPARISON: Recent brain MRI 12/10/2024 and earlier studies. CLINICAL HISTORY: 83 year old female with altered mental status and increased confusion. FINDINGS: BRAIN AND VENTRICLES: No acute hemorrhage. No evidence of acute infarct. No hydrocephalus. No extra-axial collection. No mass effect or midline shift. Advanced chronic cerebral white matter disease with scattered patchy and confluent hypodensity  is stable. Chronic bilateral cerebellar infarcts right greater than left. Stable  gray white differentiation otherwise. No suspicious intracranial vascular hyperdensity. Chronic skull base invasive pituitary adenoma, stable appearance of invasion of the sphenoid sinus and central clivus by CT (series 4 images 28 and 29). Suprasellar component of the mass appears stable partially effacing the suprasellar cistern and obscuring the optic chiasm on coronal image 307. Calcified atherosclerosis at the skull base. No suspicious intracranial vascular hyperdensity. ORBITS: No acute abnormality. SINUSES: Increased ethmoid and frontal sinus mucosal thickening which appears inflammatory. Chronic postoperative changes to the posterior nasal cavity. Tympanic cavities and mastoids remain well aerated. SOFT TISSUES AND SKULL: No acute soft tissue abnormality. No skull fracture. IMPRESSION: 1. Stable non-contrast CT appearance of chronic skull base invasive pituitary macro-adenoma, chronic brain ischemic disease. 2. Mild new paranasal sinus inflammation. 3. No acute intracranial abnormality. Electronically signed by: Helayne Hurst MD 12/22/2024 10:05 AM EST RP Workstation: [id number]   DG Chest Port 1 View Result Date: 12/22/2024 CLINICAL DATA:  Shortness of breath. Confusion. Hypotension. Suspected sepsis. EXAM: PORTABLE CHEST 1 VIEW COMPARISON:  12/10/2024 FINDINGS: Heart size remains within normal limits. Low lung volumes again noted. New pleural based opacity seen along the left lateral chest wall, suspicious for small layering left pleural effusion. Lungs are otherwise clear. IMPRESSION: Probable small layering left pleural effusion. Electronically Signed   By: Norleen DELENA Kil M.D.   On: 12/22/2024 08:48     Procedures   Medications Ordered in the ED  lactated ringers  infusion ( Intravenous New Bag/Given 12/22/24 0944)  dextrose  50 % solution (  Not Given 12/22/24 0904)  aspirin  EC tablet 81 mg (has no administration in  time range)  atorvastatin  (LIPITOR) tablet 80 mg (80 mg Oral Given 12/22/24 1243)  midodrine  (PROAMATINE ) tablet 10 mg (10 mg Oral Given 12/22/24 1754)  busPIRone  (BUSPAR ) tablet 5 mg (has no administration in time range)  traZODone  (DESYREL ) tablet 50 mg (has no administration in time range)  vortioxetine  HBr (TRINTELLIX ) tablet 10 mg (has no administration in time range)  levothyroxine  (SYNTHROID ) tablet 50 mcg (has no administration in time range)  pantoprazole  (PROTONIX ) EC tablet 40 mg (40 mg Oral Given 12/22/24 1243)  pregabalin  (LYRICA ) capsule 150 mg (150 mg Oral Given 12/22/24 1242)  albuterol  (PROVENTIL ) (2.5 MG/3ML) 0.083% nebulizer solution 2.5 mg (has no administration in time range)  fluticasone  furoate-vilanterol (BREO ELLIPTA ) 200-25 MCG/ACT 1 puff (1 puff Inhalation Given 12/22/24 1603)  umeclidinium bromide  (INCRUSE ELLIPTA ) 62.5 MCG/ACT 1 puff (1 puff Inhalation Given 12/22/24 1603)  cycloSPORINE  (RESTASIS ) 0.05 % ophthalmic emulsion 1 drop (has no administration in time range)  methylPREDNISolone  sodium succinate  (SOLU-MEDROL ) 40 mg/mL injection 40 mg (has no administration in time range)  dextromethorphan -guaiFENesin  (MUCINEX  DM) 30-600 MG per 12 hr tablet 1 tablet (1 tablet Oral Given 12/22/24 1242)  sodium chloride  flush (NS) 0.9 % injection 3 mL (3 mLs Intravenous Not Given 12/22/24 1131)  sodium chloride  flush (NS) 0.9 % injection 3 mL (3 mLs Intravenous Not Given 12/22/24 1130)  sodium chloride  flush (NS) 0.9 % injection 3 mL (has no administration in time range)  0.9 %  sodium chloride  infusion (has no administration in time range)  acetaminophen  (TYLENOL ) tablet 650 mg (has no administration in time range)    Or  acetaminophen  (TYLENOL ) suppository 650 mg (has no administration in time range)  polyethylene glycol (MIRALAX  / GLYCOLAX ) packet 17 g (has no administration in time range)  bisacodyl  (DULCOLAX) suppository 10 mg (has no administration in time range)   ondansetron  (ZOFRAN ) tablet 4 mg (has no administration in  time range)    Or  ondansetron  (ZOFRAN ) injection 4 mg (has no administration in time range)  heparin  injection 5,000 Units (has no administration in time range)  venlafaxine  (EFFEXOR ) tablet 75 mg (75 mg Oral Given 12/22/24 1754)  insulin  aspart (novoLOG ) injection 0-6 Units (has no administration in time range)  oseltamivir  (TAMIFLU ) capsule 75 mg (has no administration in time range)  ipratropium-albuterol  (DUONEB) 0.5-2.5 (3) MG/3ML nebulizer solution 3 mL (has no administration in time range)  lactated ringers  bolus 1,000 mL (0 mLs Intravenous Stopped 12/22/24 0943)  vancomycin  (VANCOCIN ) IVPB 1000 mg/200 mL premix (1,000 mg Intravenous New Bag/Given 12/22/24 0932)  ceFEPIme  (MAXIPIME ) 2 g in sodium chloride  0.9 % 100 mL IVPB (2 g Intravenous New Bag/Given 12/22/24 0858)  midodrine  (PROAMATINE ) tablet 5 mg (5 mg Oral Given 12/22/24 0836)  ipratropium-albuterol  (DUONEB) 0.5-2.5 (3) MG/3ML nebulizer solution 6 mL (6 mLs Nebulization Given 12/22/24 0910)  methylPREDNISolone  sodium succinate  (SOLU-MEDROL ) 125 mg/2 mL injection 125 mg (125 mg Intravenous Given 12/22/24 0837)  dextrose  50 % solution 50 mL (50 mLs Intravenous Given 12/22/24 0902)  oseltamivir  (TAMIFLU ) capsule 75 mg (75 mg Oral Given 12/22/24 1107)    Clinical Course as of 12/22/24 1939  Sun Dec 22, 2024  0954 Influenza A By PCR(!): POSITIVE [RP]    Clinical Course User Index [RP] Yolande Lamar BROCKS, MD                                 Medical Decision Making Amount and/or Complexity of Data Reviewed Labs: ordered. Decision-making details documented in ED Course. Radiology: ordered.  Risk Prescription drug management. Decision regarding hospitalization.   [name] White is a 83 year old female history of anemia, anxiety, asthma, depression, diabetes, and residence at the [facility] who presents to the emergency department for altered mental status.    Initial Ddx:  Influenza, URI, pneumonia, ICH, stroke, hypercapnia  MDM/Course:  Patient presents emergency department altered mental status.  Associated shortness of breath and increased confusion.  On arrival was hypoxic with a new 3 L oxygen requirement.  Also was wheezing.  Hypotensive but is on midodrine .  History is very limited at this time.  Initially was concerned about a URI or sepsis from pneumonia.  Patient was started on broad-spectrum antibiotics and she was recently hospitalized and may have hospital-acquired pneumonia.  Also given DuoNebs and steroids.  Was given IV fluids as well as her midodrine  for hypotension.  Her lactic acid came back at 1.  Low concern for septic shock at this point in time suspect the low blood pressure may be from her chronic hypotension.  Blood pressure did improve upon re-evaluation.  Chest x-ray without pneumonia.  Head CT without acute findings.  Found to have influenza which I suspect is causing her symptoms.  Started on Tamiflu  and admitted to the hospital.  Was noted to be hypoglycemic and was given D50 and juice.  This patient presents to the ED for concern of complaints listed in HPI, this involves an extensive number of treatment options, and is a complaint that carries with it a high risk of complications and morbidity. Disposition including potential need for admission considered.   Dispo: Admit to Floor  I have reviewed the patients home medications and made adjustments as needed Records reviewed Outpatient Clinic Notes The following labs were independently interpreted: CBC and show no acute abnormality I independently reviewed the following imaging with scope of  interpretation limited to determining acute life threatening conditions related to emergency care: Chest x-ray and agree with the radiologist interpretation with the following exceptions: none I personally reviewed and interpreted cardiac monitoring: normal sinus rhythm  I personally  reviewed and interpreted the pt's EKG: see above for interpretation  Consults: Hospitalist Social Determinants of health:  Geriatric  Portions of this note were generated with Scientist, clinical (histocompatibility and immunogenetics). Dictation errors may occur despite best attempts at proofreading.     Final diagnoses:  COPD exacerbation (HCC)  Influenza A  Hypoxia  Hypotension, unspecified hypotension type    ED Discharge Orders     None          Yolande Lamar BROCKS, MD 12/22/24 1939  "

## 2024-12-22 NOTE — ED Triage Notes (Signed)
 Pt bib [facility]  from [facility] with complaints of SOB and increased confusion. Pt was placed on 3L in the truck which brought patient up to 95%. Bp was 70/40s and hr 90.

## 2024-12-23 DIAGNOSIS — I959 Hypotension, unspecified: Secondary | ICD-10-CM | POA: Insufficient documentation

## 2024-12-23 DIAGNOSIS — E1169 Type 2 diabetes mellitus with other specified complication: Secondary | ICD-10-CM | POA: Insufficient documentation

## 2024-12-23 DIAGNOSIS — E1142 Type 2 diabetes mellitus with diabetic polyneuropathy: Secondary | ICD-10-CM | POA: Insufficient documentation

## 2024-12-23 DIAGNOSIS — E876 Hypokalemia: Secondary | ICD-10-CM | POA: Insufficient documentation

## 2024-12-23 DIAGNOSIS — G9341 Metabolic encephalopathy: Secondary | ICD-10-CM | POA: Diagnosis not present

## 2024-12-23 LAB — GLUCOSE, CAPILLARY
Glucose-Capillary: 231 mg/dL — ABNORMAL HIGH (ref 70–99)
Glucose-Capillary: 204 mg/dL — ABNORMAL HIGH (ref 70–99)
Glucose-Capillary: 218 mg/dL — ABNORMAL HIGH (ref 70–99)
Glucose-Capillary: 240 mg/dL — ABNORMAL HIGH (ref 70–99)

## 2024-12-23 NOTE — Progress Notes (Signed)
 Mobility Specialist Progress Note:    12/23/24 1420  Mobility  Activity Pivoted/transferred to/from [facility]  Level of Assistance Minimal assist, patient does 75% or more  Assistive Device None  Distance Ambulated (ft) 2 ft  Range of Motion/Exercises Active;All extremities  Activity Response Tolerated well  Mobility Referral Yes  Mobility visit 1 Mobility  Mobility Specialist Start Time (ACUTE ONLY) 1420  Mobility Specialist Stop Time (ACUTE ONLY) 1438  Mobility Specialist Time Calculation (min) (ACUTE ONLY) 18 min   Pt received on BSC, NT requesting assistance transferring back to chair. Required MinA to stand and pivot with no AD. Tolerated well, NT and family in room. All needs met.  Hokulani Rogel Mobility Specialist Please contact via Special Educational Needs Teacher or  Rehab office at [phone]

## 2024-12-23 NOTE — Plan of Care (Signed)

## 2024-12-23 NOTE — Progress Notes (Signed)
 Mobility Specialist Progress Note:    12/23/24 0900  Mobility  Activity Pivoted/transferred to/from [facility];Pivoted/transferred from bed to chair  Level of Assistance Minimal assist, patient does 75% or more  Assistive Device None  Distance Ambulated (ft) 4 ft  Range of Motion/Exercises Active;All extremities  Activity Response Tolerated well  Mobility Referral Yes  Mobility visit 1 Mobility  Mobility Specialist Start Time (ACUTE ONLY) 0900  Mobility Specialist Stop Time (ACUTE ONLY) 0935  Mobility Specialist Time Calculation (min) (ACUTE ONLY) 35 min   Pt received in bed, agreeable to mobility. Required MinA to stand and pivot to [facility] with no AD. NT in room assisting with peri care. Tolerated well, pt has some confusion. Left in chair, alarm on. Call bell in reach, all needs met.   Mekala Winger Mobility Specialist Please contact via Special Educational Needs Teacher or  Rehab office at [phone]

## 2024-12-23 NOTE — Progress Notes (Signed)
 " PROGRESS NOTE  [name] White, is a 83 y.o. female, DOB - 01-Mar-1941, FMW:[id number]  Admit date - 12/22/2024   Admitting Physician [name] Zogg Pearlean, MD  Outpatient Primary MD for the patient is [name] Norleen PEDLAR, MD  LOS - 1  Chief Complaint  Patient presents with   Shortness of Breath   Brief Narrative:  83 y.o. female with medical history significant DM2,  HTN, hypothyroidism, vascular dementia, malnutrition, history of CVAs, asthma, chronic anemia and B12 deficiency admitted on 12/22/2024 with acute metabolic encephalopathy in the setting of influenza A infection    -Assessment and Plan: -1) influenza A infection--- very symptomatic with hypoxia - c/n Tamiflu , bronchodilators and mucolytic's   2) acute hypoxic respiratory failure due to 1 above - Currently requiring 2 to 3 L of oxygen via nasal cannula - Should improve with management of #1 number   3) acute metabolic encephalopathy--due to #1 above -CT head without contrast shows no new acute intracranial findings -Anticipate improvement with improvement in #1 above - 4) acute COPD exacerbation with acute hypoxic respiratory failure--- due to 1 above -IV Solu-Medrol , bronchodilators and mucolytics as ordered -Try to wean off O2 as able   5)DM2-recent A1c 6.5 reflecting good diabetic control PTA -Hold PTA metformin  -Anticipate worsening hyperglycemia with steroid Use Novolog /Humalog Sliding scale insulin  with Accu-Cheks/Fingersticks as ordered    6)GERD--continue Protonix  especially while on high-dose steroid   7) history of prior strokes--no new strokelike symptoms at this time  -continue aspirin  and atorvastatin  for secondary prevention   8)Depression/history of vascular dementia-- currently with metabolic encephalopathy due to 1 above - Continue Effexor  and Trintellix    9)Hypothyroidism--continue levothyroxine    10) chronic pain/neuropathy--- continue Lyrica    11)SoFT BP at baseline--- -In ED patient was found to be  transiently hypotensive, Bp was 70/40s and hr 90,  required IV fluids at baseline she takes midodrine , continue midodrine    12) chronic anemia/B12 deficiency-- hemoglobin 11.6 which is close to baseline  13) generalized weakness and deconditioning--- physical therapy eval appreciated recommends SNF rehab   Status is: Inpatient   Disposition: The patient is from: SNF              Anticipated d/c is to: SNF              Anticipated d/c date is: 1 day              Patient currently is not medically stable to d/c. Barriers: Not Clinically Stable-   Code Status :  -  Code Status: Limited: Do not attempt resuscitation (DNR) -DNR-LIMITED -Do Not Intubate/DNI    Family Communication:   I called and updated patient's daughter Ms. [name] White  DVT Prophylaxis  :   - SCDs  heparin  injection 5,000 Units Start: 12/22/24 2200 SCDs Start: 12/22/24 1118 Place TED hose Start: 12/22/24 1118   Lab Results  Component Value Date   PLT 368 12/22/2024    Inpatient Medications  Scheduled Meds:  aspirin  EC  81 mg Oral Q breakfast   atorvastatin   80 mg Oral Daily   cycloSPORINE   1 drop Both Eyes BID   dextromethorphan -guaiFENesin   1 tablet Oral BID   fluticasone  furoate-vilanterol  1 puff Inhalation Daily   heparin   5,000 Units Subcutaneous Q8H   insulin  aspart  0-6 Units Subcutaneous TID WC   ipratropium-albuterol   3 mL Nebulization TID   levothyroxine   50 mcg Oral Daily   methylPREDNISolone  (SOLU-MEDROL ) injection  40 mg Intravenous Q12H   midodrine   10  mg Oral TID WC   oseltamivir   75 mg Oral BID   pantoprazole   40 mg Oral Daily   pregabalin   150 mg Oral BID   sodium chloride  flush  3 mL Intravenous Q12H   sodium chloride  flush  3 mL Intravenous Q12H   umeclidinium bromide   1 puff Inhalation Daily   venlafaxine   75 mg Oral BID WC   vortioxetine  HBr  10 mg Oral Daily   Continuous Infusions: PRN Meds:.acetaminophen  **OR** acetaminophen , albuterol , bisacodyl , busPIRone , ondansetron  **OR**  ondansetron  (ZOFRAN ) IV, polyethylene glycol, sodium chloride  flush, traZODone    Anti-infectives (From admission, onward)    Start     Dose/Rate Route Frequency Ordered Stop   12/22/24 1915  oseltamivir  (TAMIFLU ) capsule 75 mg        75 mg Oral 2 times daily 12/22/24 1826 12/27/24 2159   12/22/24 1100  oseltamivir  (TAMIFLU ) capsule 75 mg        75 mg Oral STAT 12/22/24 1055 12/22/24 1107   12/22/24 0830  vancomycin  (VANCOCIN ) IVPB 1000 mg/200 mL premix        1,000 mg 200 mL/hr over 60 Minutes Intravenous  Once 12/22/24 0820 12/22/24 1032   12/22/24 0830  ceFEPIme  (MAXIPIME ) 2 g in sodium chloride  0.9 % 100 mL IVPB        2 g 200 mL/hr over 30 Minutes Intravenous  Once 12/22/24 0820 12/22/24 9071         Subjective: [name] White today has no fevers, no emesis,  No chest pain,  - I called and updated patient's daughter Ms. [name] White, questions answered - Oral intake improving -Mentation improving  Objective: Vitals:   12/23/24 0411 12/23/24 0613 12/23/24 0812 12/23/24 1434  BP: 108/77     Pulse: 93     Resp: 19     Temp: 97.7 F (36.5 C)     TempSrc: Oral     SpO2: 93% 95% 98% 96%  Weight:      Height:        Intake/Output Summary (Last 24 hours) at 12/23/2024 1827 Last data filed at 12/23/2024 0545 Gross per 24 hour  Intake 1000 ml  Output 800 ml  Net 200 ml   Filed Weights   12/22/24 0805 12/22/24 1656  Weight: 62.6 kg 60.3 kg    Physical Exam  Gen:- Awake Alert, cooperative and in no acute distress HEENT:- [place].AT, No sclera icterus Nose- [place] 3L/min Neck-Supple Neck,No JVD,.  Lungs-Minich breath sounds with few scattered wheezes  CV- S1, S2 normal, regular  Abd-  +ve B.Sounds, Abd Soft, No tenderness,    Extremity/Skin:- No  edema, pedal pulses present  Psych-affect is appropriate, mostly oriented, Neuro-generalized weakness, no new focal deficits, no tremors  Data Reviewed: I have personally reviewed following labs and imaging  studies  CBC: Recent Labs  Lab 12/22/24 0848  WBC 7.1  NEUTROABS 4.5  HGB 11.6*  HCT 37.0  MCV 90.7  PLT 368   Basic Metabolic Panel: Recent Labs  Lab 12/22/24 0848  NA 135  K 4.0  CL 99  CO2 20*  GLUCOSE 63*  BUN 19  CREATININE 0.68  CALCIUM  8.2*   GFR: Estimated Creatinine Clearance: 49.9 mL/min (by C-G formula based on SCr of 0.68 mg/dL). Liver Function Tests: Recent Labs  Lab 12/22/24 0848  AST 37  ALT 35  ALKPHOS 65  BILITOT 0.4  PROT 6.3*  ALBUMIN 3.4*   Recent Labs    11/08/24 1329 12/22/24 0848  PROBNP 1,197.0* 191.0  Recent Results (from the past 240 hours)  Resp panel by RT-PCR (RSV, Flu A&B, Covid) Anterior Nasal Swab     Status: Abnormal   Collection Time: 12/21/24  2:00 PM   Specimen: Anterior Nasal Swab  Result Value Ref Range Status   SARS Coronavirus 2 by RT PCR NEGATIVE NEGATIVE Final    Comment: (NOTE) SARS-CoV-2 target nucleic acids are NOT DETECTED.  The SARS-CoV-2 RNA is generally detectable in upper respiratory specimens during the acute phase of infection. The lowest concentration of SARS-CoV-2 viral copies this assay can detect is 138 copies/mL. A negative result does not preclude SARS-Cov-2 infection and should not be used as the sole basis for treatment or other patient management decisions. A negative result may occur with  improper specimen collection/handling, submission of specimen other than nasopharyngeal swab, presence of viral mutation(s) within the areas targeted by this assay, and inadequate number of viral copies(<138 copies/mL). A negative result must be combined with clinical observations, patient history, and epidemiological information. The expected result is Negative.  Fact Sheet for Patients:  bloggercourse.com  Fact Sheet for Healthcare Providers:  seriousbroker.it  This test is no t yet approved or cleared by the [place]  FDA and  has been  authorized for detection and/or diagnosis of SARS-CoV-2 by FDA under an Emergency Use Authorization (EUA). This EUA will remain  in effect (meaning this test can be used) for the duration of the COVID-19 declaration under Section 564(b)(1) of the Act, 21 U.S.C.section 360bbb-3(b)(1), unless the authorization is terminated  or revoked sooner.       Influenza A by PCR POSITIVE (A) NEGATIVE Final   Influenza B by PCR NEGATIVE NEGATIVE Final    Comment: (NOTE) The Xpert Xpress SARS-CoV-2/FLU/RSV plus assay is intended as an aid in the diagnosis of influenza from Nasopharyngeal swab specimens and should not be used as a sole basis for treatment. Nasal washings and aspirates are unacceptable for Xpert Xpress SARS-CoV-2/FLU/RSV testing.  Fact Sheet for Patients: bloggercourse.com  Fact Sheet for Healthcare Providers: seriousbroker.it  This test is not yet approved or cleared by the [place]  FDA and has been authorized for detection and/or diagnosis of SARS-CoV-2 by FDA under an Emergency Use Authorization (EUA). This EUA will remain in effect (meaning this test can be used) for the duration of the COVID-19 declaration under Section 564(b)(1) of the Act, 21 U.S.C. section 360bbb-3(b)(1), unless the authorization is terminated or revoked.     Resp Syncytial Virus by PCR NEGATIVE NEGATIVE Final    Comment: (NOTE) Fact Sheet for Patients: bloggercourse.com  Fact Sheet for Healthcare Providers: seriousbroker.it  This test is not yet approved or cleared by the [place]  FDA and has been authorized for detection and/or diagnosis of SARS-CoV-2 by FDA under an Emergency Use Authorization (EUA). This EUA will remain in effect (meaning this test can be used) for the duration of the COVID-19 declaration under Section 564(b)(1) of the Act, 21 U.S.C. section 360bbb-3(b)(1), unless the  authorization is terminated or revoked.  Performed at [facility], [address], [place], [place] [address]   Blood Culture (routine x 2)     Status: None (Preliminary result)   Collection Time: 12/22/24  8:25 AM   Specimen: BLOOD  Result Value Ref Range Status   Specimen Description BLOOD LEFT ANTECUBITAL  Final   Special Requests   Final    BOTTLES DRAWN AEROBIC AND ANAEROBIC Blood Culture adequate volume   Culture   Final    NO GROWTH < 24  HOURS Performed at [facility], [address], [place], [place] [address]    Report Status PENDING  Incomplete  Resp panel by RT-PCR (RSV, Flu A&B, Covid) Anterior Nasal Swab     Status: Abnormal   Collection Time: 12/22/24  8:40 AM   Specimen: Anterior Nasal Swab  Result Value Ref Range Status   SARS Coronavirus 2 by RT PCR NEGATIVE NEGATIVE Final    Comment: (NOTE) SARS-CoV-2 target nucleic acids are NOT DETECTED.  The SARS-CoV-2 RNA is generally detectable in upper respiratory specimens during the acute phase of infection. The lowest concentration of SARS-CoV-2 viral copies this assay can detect is 138 copies/mL. A negative result does not preclude SARS-Cov-2 infection and should not be used as the sole basis for treatment or other patient management decisions. A negative result may occur with  improper specimen collection/handling, submission of specimen other than nasopharyngeal swab, presence of viral mutation(s) within the areas targeted by this assay, and inadequate number of viral copies(<138 copies/mL). A negative result must be combined with clinical observations, patient history, and epidemiological information. The expected result is Negative.  Fact Sheet for Patients:  bloggercourse.com  Fact Sheet for Healthcare Providers:  seriousbroker.it  This test is no t yet approved or cleared by the [place]  FDA and  has been authorized for detection and/or diagnosis of  SARS-CoV-2 by FDA under an Emergency Use Authorization (EUA). This EUA will remain  in effect (meaning this test can be used) for the duration of the COVID-19 declaration under Section 564(b)(1) of the Act, 21 U.S.C.section 360bbb-3(b)(1), unless the authorization is terminated  or revoked sooner.       Influenza A by PCR POSITIVE (A) NEGATIVE Final   Influenza B by PCR NEGATIVE NEGATIVE Final    Comment: (NOTE) The Xpert Xpress SARS-CoV-2/FLU/RSV plus assay is intended as an aid in the diagnosis of influenza from Nasopharyngeal swab specimens and should not be used as a sole basis for treatment. Nasal washings and aspirates are unacceptable for Xpert Xpress SARS-CoV-2/FLU/RSV testing.  Fact Sheet for Patients: bloggercourse.com  Fact Sheet for Healthcare Providers: seriousbroker.it  This test is not yet approved or cleared by the [place]  FDA and has been authorized for detection and/or diagnosis of SARS-CoV-2 by FDA under an Emergency Use Authorization (EUA). This EUA will remain in effect (meaning this test can be used) for the duration of the COVID-19 declaration under Section 564(b)(1) of the Act, 21 U.S.C. section 360bbb-3(b)(1), unless the authorization is terminated or revoked.     Resp Syncytial Virus by PCR NEGATIVE NEGATIVE Final    Comment: (NOTE) Fact Sheet for Patients: bloggercourse.com  Fact Sheet for Healthcare Providers: seriousbroker.it  This test is not yet approved or cleared by the [place]  FDA and has been authorized for detection and/or diagnosis of SARS-CoV-2 by FDA under an Emergency Use Authorization (EUA). This EUA will remain in effect (meaning this test can be used) for the duration of the COVID-19 declaration under Section 564(b)(1) of the Act, 21 U.S.C. section 360bbb-3(b)(1), unless the authorization is terminated  or revoked.  Performed at [facility], [address] [place], [place] [address]   Blood Culture (routine x 2)     Status: None (Preliminary result)   Collection Time: 12/22/24  8:48 AM   Specimen: Left Antecubital; Blood  Result Value Ref Range Status   Specimen Description LEFT ANTECUBITAL AEROBIC BOTTLE ONLY  Final   Special Requests   Final    Blood Culture results may not  be optimal due to an inadequate volume of blood received in culture bottles   Culture   Final    NO GROWTH < 24 HOURS Performed at [facility], [address], [place], [place] [address]    Report Status PENDING  Incomplete    Radiology Studies: CT Head Wo Contrast Result Date: 12/22/2024 EXAM: CT HEAD WITHOUT CONTRAST 12/22/2024 09:27:18 AM TECHNIQUE: CT of the head was performed without the administration of intravenous contrast. Automated exposure control, iterative reconstruction, and/or weight based adjustment of the mA/kV was utilized to reduce the radiation dose to as low as reasonably achievable. COMPARISON: Recent brain MRI 12/10/2024 and earlier studies. CLINICAL HISTORY: 83 year old female with altered mental status and increased confusion. FINDINGS: BRAIN AND VENTRICLES: No acute hemorrhage. No evidence of acute infarct. No hydrocephalus. No extra-axial collection. No mass effect or midline shift. Advanced chronic cerebral white matter disease with scattered patchy and confluent hypodensity is stable. Chronic bilateral cerebellar infarcts right greater than left. Stable gray white differentiation otherwise. No suspicious intracranial vascular hyperdensity. Chronic skull base invasive pituitary adenoma, stable appearance of invasion of the sphenoid sinus and central clivus by CT (series 4 images 28 and 29). Suprasellar component of the mass appears stable partially effacing the suprasellar cistern and obscuring the optic chiasm on coronal image 307. Calcified atherosclerosis at the skull base. No suspicious  intracranial vascular hyperdensity. ORBITS: No acute abnormality. SINUSES: Increased ethmoid and frontal sinus mucosal thickening which appears inflammatory. Chronic postoperative changes to the posterior nasal cavity. Tympanic cavities and mastoids remain well aerated. SOFT TISSUES AND SKULL: No acute soft tissue abnormality. No skull fracture. IMPRESSION: 1. Stable non-contrast CT appearance of chronic skull base invasive pituitary macro-adenoma, chronic brain ischemic disease. 2. Mild new paranasal sinus inflammation. 3. No acute intracranial abnormality. Electronically signed by: Helayne Hurst MD 12/22/2024 10:05 AM EST RP Workstation: [id number]   DG Chest Port 1 View Result Date: 12/22/2024 CLINICAL DATA:  Shortness of breath. Confusion. Hypotension. Suspected sepsis. EXAM: PORTABLE CHEST 1 VIEW COMPARISON:  12/10/2024 FINDINGS: Heart size remains within normal limits. Low lung volumes again noted. New pleural based opacity seen along the left lateral chest wall, suspicious for small layering left pleural effusion. Lungs are otherwise clear. IMPRESSION: Probable small layering left pleural effusion. Electronically Signed   By: Norleen DELENA Kil M.D.   On: 12/22/2024 08:48   Scheduled Meds:  aspirin  EC  81 mg Oral Q breakfast   atorvastatin   80 mg Oral Daily   cycloSPORINE   1 drop Both Eyes BID   dextromethorphan -guaiFENesin   1 tablet Oral BID   fluticasone  furoate-vilanterol  1 puff Inhalation Daily   heparin   5,000 Units Subcutaneous Q8H   insulin  aspart  0-6 Units Subcutaneous TID WC   ipratropium-albuterol   3 mL Nebulization TID   levothyroxine   50 mcg Oral Daily   methylPREDNISolone  (SOLU-MEDROL ) injection  40 mg Intravenous Q12H   midodrine   10 mg Oral TID WC   oseltamivir   75 mg Oral BID   pantoprazole   40 mg Oral Daily   pregabalin   150 mg Oral BID   sodium chloride  flush  3 mL Intravenous Q12H   sodium chloride  flush  3 mL Intravenous Q12H   umeclidinium bromide   1 puff Inhalation Daily    venlafaxine   75 mg Oral BID WC   vortioxetine  HBr  10 mg Oral Daily  Continuous Infusions:   LOS: 1 day   Rendall Carwin M.D on 12/23/2024 at 6:27 PM  Go to [email or web address] - for contact info  [facility] - Office  [phone]  If 7PM-7AM, please contact night-coverage [email or web address] 12/23/2024, 6:27 PM    "

## 2024-12-23 NOTE — TOC Initial Note (Addendum)
 Transition of Care [facility]) - Initial/Assessment Note    Patient Details  Name: [name] White MRN: [id number] Date of Birth: 06-13-1941  [name] CM/SW Contact:    Mcarthur Saddie Kim, LCSW Phone Number: 12/23/2024, 8:43 AM  Clinical Narrative: Pt admitted due to influenza A. Assessment completed with pt's daughter, [name] White as pt oriented to self only per chart. [name] White reports pt has been a resident at [facility] for about a week. Plan is to return to [facility] for rehab when medically stable. Will need PT eval to start authorization when appropriate. MD notified. Per Kirke at facility, okay to return and no FL2 needed.                    Expected Discharge Plan: Skilled Nursing Facility Barriers to Discharge: Continued Medical Work up   Patient Goals and CMS Choice Patient states their goals for this hospitalization and ongoing recovery are:: return to SNF   Choice offered to / list presented to : Adult Children      Expected Discharge Plan and Services In-house Referral: Clinical Social Work   Post Acute Care Choice: Skilled Nursing Facility Living arrangements for the past 2 months: Skilled Nursing Facility                                      Prior Living Arrangements/Services Living arrangements for the past 2 months: Skilled Nursing Facility Lives with:: Facility Resident Patient language and need for interpreter reviewed:: Yes Do you feel safe going back to the place where you live?: Yes      Need for Family Participation in Patient Care: Yes (Comment) Care giver support system in place?: Yes (comment)   Criminal Activity/Legal Involvement Pertinent to Current Situation/Hospitalization: No - Comment as needed  Activities of Daily Living   ADL Screening (condition at time of admission) Independently performs ADLs?: No Does the patient have a NEW difficulty with bathing/dressing/toileting/self-feeding that is expected to last >3 days?: No Does the patient  have a NEW difficulty with getting in/out of bed, walking, or climbing stairs that is expected to last >3 days?: No Does the patient have a NEW difficulty with communication that is expected to last >3 days?: No Is the patient deaf or have difficulty hearing?: No Does the patient have difficulty seeing, even when wearing glasses/contacts?: Yes Does the patient have difficulty concentrating, remembering, or making decisions?: Yes  Permission Sought/Granted                  Emotional Assessment       Orientation: : Oriented to Self Alcohol / Substance Use: Not Applicable Psych Involvement: No (comment)  Admission diagnosis:  Acute metabolic encephalopathy [G93.41] Patient Active Problem List   Diagnosis Date Noted   Influenza A 12/22/2024   Acute hypoxic respiratory failure (HCC) 12/22/2024   Unspecified protein-calorie malnutrition 12/18/2024   Volume depletion 12/11/2024   Vascular dementia (HCC) 12/11/2024   Somnolence 12/11/2024   COPD with acute exacerbation (HCC) 11/10/2024   Lobar pneumonia 11/09/2024   Uncontrolled type 2 diabetes mellitus with hyperglycemia, with long-term current use of insulin  (HCC) 11/09/2024   CAP (community acquired pneumonia) 10/11/2024   Abnormal gait 07/10/2024   Cervical radiculopathy 07/10/2024   Chronic neck pain 07/10/2024   Visual disturbance 07/10/2024   Recurrent UTI 07/09/2024   Sepsis due to undetermined organism (HCC) 05/22/2024   Hypomagnesemia 05/01/2024  CVA (cerebral vascular accident) (HCC) 03/12/2024   Anxiety and depression 03/12/2024   GERD without esophagitis 03/12/2024   Pressure injury of skin 12/27/2023   Acute metabolic encephalopathy 12/26/2023   Status post transsphenoidal pituitary resection 06/04/2020   Pituitary adenoma with extrasellar extension (HCC) 06/04/2020   Pituitary tumor 05/29/2020   Vitamin D  deficiency 01/28/2020   Paroxysmal supraventricular tachycardia 06/19/2014   Rectocele 03/19/2013    Constipation 03/19/2013   HYPERCHOLESTEROLEMIA 02/09/2009   Essential hypertension 02/09/2009   Osteoporosis 02/09/2009   Hypothyroidism 09/14/2007   Migraine headache 09/14/2007   Type 2 diabetes mellitus with peripheral neuropathy (HCC) 09/14/2007   PCP:  [name], MD Pharmacy:   [facility], [place] - [address] [place] [address] Phone: [phone] Fax: [phone]  [facility] - [place], [place] - [address], [address] [place] [address] Phone: [phone] Fax: [phone]  [facility] - [place], [place] - [address] [place] [address] Phone: [phone] Fax: [phone]     Social Drivers of Health (SDOH) Social History: SDOH Screenings   Food Insecurity: Patient Unable To Answer (12/22/2024)  Housing: Unknown (12/22/2024)  Transportation Needs: Patient Unable To Answer (12/22/2024)  Utilities: Patient Unable To Answer (12/22/2024)  Depression (PHQ2-9): Low Risk (10/28/2024)  Social Connections: Patient Unable To Answer (12/22/2024)  Recent Concern: Social Connections - Socially Isolated (11/08/2024)  Tobacco Use: Low Risk (12/22/2024)   SDOH Interventions:     Readmission Risk Interventions    12/23/2024    8:42 AM 12/16/2024    9:47 AM 12/14/2024   10:51 AM  Readmission Risk Prevention Plan  Transportation Screening Complete Complete Complete  Medication Review (RN Care Manager) Complete Complete Complete  HRI or Home Care Consult Complete Complete Complete  SW Recovery Care/Counseling Consult Complete Complete Complete  Palliative Care Screening Not Applicable Not Applicable Not Applicable  Skilled Nursing Facility Complete Complete Complete

## 2024-12-23 NOTE — Plan of Care (Signed)
" °  Problem: Acute Rehab PT Goals(only PT should resolve) Goal: Pt Will Go Supine/Side To Sit Outcome: Progressing Flowsheets (Taken 12/23/2024 1501) Pt will go Supine/Side to Sit: with supervision Goal: Patient Will Transfer Sit To/From Stand Outcome: Progressing Flowsheets (Taken 12/23/2024 1501) Patient will transfer sit to/from stand:  with contact guard assist  with minimal assist Goal: Pt Will Transfer Bed To Chair/Chair To Bed Outcome: Progressing Flowsheets (Taken 12/23/2024 1501) Pt will Transfer Bed to Chair/Chair to Bed:  with contact guard assist  with min assist Goal: Pt Will Ambulate Outcome: Progressing Flowsheets (Taken 12/23/2024 1501) Pt will Ambulate:  25 feet  with minimal assist  with moderate assist  with rolling walker   3:02 PM, 12/23/2024 Lynwood Music, MPT Physical Therapist with [facility] [phone] office [phone] mobile phone  "

## 2024-12-23 NOTE — Evaluation (Signed)
 Physical Therapy Evaluation Patient Details Name: [name] White MRN: [id number] DOB: [birth year] Today's Date: 12/23/2024  History of Present Illness  [name] White is a 83 y.o. female with medical history significant DM2,  HTN, hypothyroidism, vascular dementia, malnutrition, history of CVAs, asthma, chronic anemia and B12 deficiency, presenting from [facility] center SN[facility] with altered mentation and increasing oxygen requirement in the setting of influenza A infection  -Patient is a poor historian, so history is somewhat limited   Clinical Impression  Patient demonstrates slow labored movement for getting into/out of bed, very unsteady on feet when taking steps at bedside with buckling of knees and scissoring of legs due to weakness and poor standing balance. Patient tolerated staying up in chair after therapy with family member present. Patient will benefit from continued skilled physical therapy in hospital and recommended venue below to increase strength, balance, endurance for safe ADLs and gait.           If plan is discharge home, recommend the following: A lot of help with walking and/or transfers;A lot of help with bathing/dressing/bathroom;Supervision due to cognitive status;Help with stairs or ramp for entrance;Assist for transportation   Can travel by private vehicle   No    Equipment Recommendations None recommended by PT  Recommendations for Other Services       Functional Status Assessment Patient has had a recent decline in their functional status and demonstrates the ability to make significant improvements in function in a reasonable and predictable amount of time.     Precautions / Restrictions Precautions Precautions: Fall Recall of Precautions/Restrictions: Impaired Restrictions Weight Bearing Restrictions Per Provider Order: No      Mobility  Bed Mobility Overal bed mobility: Needs Assistance Bed Mobility: Supine to Sit, Sit to Supine     Supine to sit:  Contact guard, Min assist Sit to supine: Contact guard assist, Min assist   General bed mobility comments: increased time, labored movement    Transfers Overall transfer level: Needs assistance Equipment used: Rolling walker (2 wheels) Transfers: Sit to/from Stand, Bed to chair/wheelchair/BSC Sit to Stand: Mod assist     Squat pivot transfers: Mod assist     General transfer comment: unsteady labored movement with frequent scissoring of legs    Ambulation/Gait Ambulation/Gait assistance: Mod assist, Max assist Gait Distance (Feet): 12 Feet Assistive device: Rolling walker (2 wheels) Gait Pattern/deviations: Decreased step length - right, Decreased step length - left, Decreased stride length, Knees buckling, Scissoring Gait velocity: slow     General Gait Details: limited to a few steps forward/backwards at bedside due to buckling of knees and frequent scissoring legs taking due to weakness  Stairs            Wheelchair Mobility     Tilt Bed    Modified Rankin (Stroke Patients Only)       Balance Overall balance assessment: Needs assistance Sitting-balance support: Feet supported, No upper extremity supported Sitting balance-Leahy Scale: Fair Sitting balance - Comments: seated at EOB   Standing balance support: Reliant on assistive device for balance, During functional activity, Bilateral upper extremity supported Standing balance-Leahy Scale: Poor Standing balance comment: using RW                             Pertinent Vitals/Pain Pain Assessment Pain Assessment: No/denies pain    Home Living Family/patient expects to be discharged to:: Assisted living  Home Equipment: Agricultural Consultant (2 wheels);Rollator (4 wheels);Cane - quad;Cane - single point;Hand held shower head;Shower seat - built in;Grab bars - tub/shower      Prior Function Prior Level of Function : Needs assist       Physical Assist : Mobility  (physical);ADLs (physical) Mobility (physical): Bed mobility;Transfers;Gait ADLs (physical): Bathing;Dressing;Toileting;IADLs Mobility Comments: One person assisted transfers to w/c, uses w/c for mobility, ambulates with assistance with HHPT ADLs Comments: Assisted by ALF staff     Extremity/Trunk Assessment   Upper Extremity Assessment Upper Extremity Assessment: Generalized weakness    Lower Extremity Assessment Lower Extremity Assessment: Generalized weakness    Cervical / Trunk Assessment Cervical / Trunk Assessment: Kyphotic  Communication   Communication Communication: No apparent difficulties    Cognition Arousal: Alert Behavior During Therapy: WFL for tasks assessed/performed   PT - Cognitive impairments: History of cognitive impairments                         Following commands: Intact Following commands impaired: Follows one step commands with increased time     Cueing Cueing Techniques: Verbal cues, Tactile cues     General Comments      Exercises     Assessment/Plan    PT Assessment Patient needs continued PT services  PT Problem List Decreased strength;Decreased activity tolerance;Decreased balance;Decreased mobility       PT Treatment Interventions DME instruction;Balance training;Gait training;Functional mobility training;Therapeutic activities;Therapeutic exercise;Patient/family education    PT Goals (Current goals can be found in the Care Plan section)  Acute Rehab PT Goals Patient Stated Goal: return home after rehab PT Goal Formulation: With patient/family Time For Goal Achievement: 01/06/25 Potential to Achieve Goals: Good    Frequency Min 3X/week     Co-evaluation               AM-PAC PT 6 Clicks Mobility  Outcome Measure Help needed turning from your back to your side while in a flat bed without using bedrails?: A Little Help needed moving from lying on your back to sitting on the side of a flat bed without  using bedrails?: A Little Help needed moving to and from a bed to a chair (including a wheelchair)?: A Lot Help needed standing up from a chair using your arms (e.g., wheelchair or bedside chair)?: A Lot Help needed to walk in hospital room?: A Lot Help needed climbing 3-5 steps with a railing? : Total 6 Click Score: 13    End of Session   Activity Tolerance: Patient tolerated treatment well;Patient limited by fatigue Patient left: in chair;with call bell/phone within reach Nurse Communication: Mobility status PT Visit Diagnosis: Unsteadiness on feet (R26.81);Other abnormalities of gait and mobility (R26.89);Muscle weakness (generalized) (M62.81)    Time: 8596-8572 PT Time Calculation (min) (ACUTE ONLY): 24 min   Charges:   PT Evaluation $PT Eval Moderate Complexity: 1 Mod PT Treatments $Therapeutic Activity: 23-37 mins PT General Charges $$ ACUTE PT VISIT: 1 Visit         3:00 PM, 12/23/2024 Lynwood Music, MPT Physical Therapist with [facility] [phone] office [phone] mobile phone

## 2024-12-24 DIAGNOSIS — G9341 Metabolic encephalopathy: Secondary | ICD-10-CM | POA: Diagnosis not present

## 2024-12-24 LAB — GLUCOSE, CAPILLARY
Glucose-Capillary: 181 mg/dL — ABNORMAL HIGH (ref 70–99)
Glucose-Capillary: 303 mg/dL — ABNORMAL HIGH (ref 70–99)
Glucose-Capillary: 176 mg/dL — ABNORMAL HIGH (ref 70–99)
Glucose-Capillary: 315 mg/dL — ABNORMAL HIGH (ref 70–99)

## 2024-12-24 NOTE — Progress Notes (Signed)
 " PROGRESS NOTE  [name] White, is a 83 y.o. female, DOB - Jul 05, 1941, FMW:[id number]  Admit date - 12/22/2024   Admitting Physician Mar Walmer Pearlean, MD  Outpatient Primary MD for the patient is [name], MD  LOS - 2  Chief Complaint  Patient presents with   Shortness of Breath   Brief Narrative:  83 y.o. female with medical history significant DM2,  HTN, hypothyroidism, vascular dementia, malnutrition, history of CVAs, asthma, chronic anemia and B12 deficiency admitted on 12/22/2024 with acute metabolic encephalopathy in the setting of influenza A infection -Patient is medically stable for discharge to SNF facility, awaiting SNF bed and insurance authorization at this time   -Assessment and Plan: -1) influenza A infection--- flu symptoms have improved significantly --hypoxia has resolved-  - Okay to complete Tamiflu , - Influenza symptoms vastly improved, no further fevers - Continue bronchodilators and mucolytic's   2) acute hypoxic respiratory failure due to 1 above - Respiratory status has improved significantly -Hypoxia is resolved patient is currently on room air -Management as above #1   3) acute metabolic encephalopathy--due to #1 above -CT head without contrast shows no new acute intracranial findings -Mentation is back to baseline at this time - 4) acute COPD exacerbation with acute hypoxic respiratory failure--- due to 1 above - Treated with IV Solu-Medrol , bronchodilators and mucolytics as ordered Improved significantly with above measures -Weaned off oxygen completely currently on room air -Okay to switch to oral prednisone  from IV Solu-Medrol  starting 12/25/2024   5)DM2-recent A1c 6.5 reflecting good diabetic control PTA -Hold PTA metformin  -Anticipate worsening hyperglycemia with steroid Use Novolog /Humalog Sliding scale insulin  with Accu-Cheks/Fingersticks as ordered    6)GERD--continue Protonix  especially while on high-dose steroid   7) history of prior  strokes--no new strokelike symptoms at this time  -continue aspirin  and atorvastatin  for secondary prevention   8)Depression/history of vascular dementia--  -- Continue Effexor  and Trintellix  -Mentation is back to baseline   9)Hypothyroidism--continue levothyroxine    10) chronic pain/neuropathy--- continue Lyrica    11)SoFT BP at baseline--- -In ED patient was found to be transiently hypotensive, Bp was 70/40s and hr 90,  required IV fluids at baseline she takes midodrine , continue midodrine  --BP is stabilized   12) chronic anemia/B12 deficiency-- hemoglobin 11.6 which is close to baseline  13) generalized weakness and deconditioning--- physical therapy eval appreciated recommends SNF rehab   Status is: Inpatient   Disposition: The patient is from: SNF              Anticipated d/c is to: SNF              Anticipated d/c date is: 1 day              Patient currently is medically stable to d/c. Barriers: Patient is medically stable for discharge to SNF facility, awaiting SNF bed and insurance authorization at this time  Code Status :  -  Code Status: Limited: Do not attempt resuscitation (DNR) -DNR-LIMITED -Do Not Intubate/DNI    Family Communication:    patient's daughter Ms. [name] White at bedside on 12/24/2024  DVT Prophylaxis  :   - SCDs  heparin  injection 5,000 Units Start: 12/22/24 2200 SCDs Start: 12/22/24 1118 Place TED hose Start: 12/22/24 1118   Lab Results  Component Value Date   PLT 368 12/22/2024    Inpatient Medications  Scheduled Meds:  aspirin  EC  81 mg Oral Q breakfast   atorvastatin   80 mg Oral Daily   cycloSPORINE   1 drop  Both Eyes BID   dextromethorphan -guaiFENesin   1 tablet Oral BID   fluticasone  furoate-vilanterol  1 puff Inhalation Daily   heparin   5,000 Units Subcutaneous Q8H   insulin  aspart  0-6 Units Subcutaneous TID WC   ipratropium-albuterol   3 mL Nebulization TID   levothyroxine   50 mcg Oral Daily   methylPREDNISolone  (SOLU-MEDROL )  injection  40 mg Intravenous Q12H   midodrine   10 mg Oral TID WC   oseltamivir   75 mg Oral BID   pantoprazole   40 mg Oral Daily   pregabalin   150 mg Oral BID   sodium chloride  flush  3 mL Intravenous Q12H   sodium chloride  flush  3 mL Intravenous Q12H   umeclidinium bromide   1 puff Inhalation Daily   venlafaxine   75 mg Oral BID WC   vortioxetine  HBr  10 mg Oral Daily   Continuous Infusions: PRN Meds:.acetaminophen  **OR** acetaminophen , albuterol , bisacodyl , busPIRone , ondansetron  **OR** ondansetron  (ZOFRAN ) IV, polyethylene glycol, sodium chloride  flush, traZODone    Anti-infectives (From admission, onward)    Start     Dose/Rate Route Frequency Ordered Stop   12/22/24 1915  oseltamivir  (TAMIFLU ) capsule 75 mg        75 mg Oral 2 times daily 12/22/24 1826 12/27/24 2159   12/22/24 1100  oseltamivir  (TAMIFLU ) capsule 75 mg        75 mg Oral STAT 12/22/24 1055 12/22/24 1107   12/22/24 0830  vancomycin  (VANCOCIN ) IVPB 1000 mg/200 mL premix        1,000 mg 200 mL/hr over 60 Minutes Intravenous  Once 12/22/24 0820 12/22/24 1032   12/22/24 0830  ceFEPIme  (MAXIPIME ) 2 g in sodium chloride  0.9 % 100 mL IVPB        2 g 200 mL/hr over 30 Minutes Intravenous  Once 12/22/24 0820 12/22/24 9071         Subjective: [name] today has no fevers, no emesis,  No chest pain,  -   patient's daughter Ms. [name] White, at bedside, questions answered - -Oral intake has improved - Mentation has improved - Respiratory status has improved significantly - Hypoxia has resolved patient has been weaned off O2 -- Influenza symptoms vastly improved, no further fevers Patient is medically stable for discharge to SNF facility, awaiting SNF bed and insurance authorization at this time  Objective: Vitals:   12/24/24 0643 12/24/24 0839 12/24/24 1401 12/24/24 1403  BP:   117/72   Pulse:   79   Resp:   18   Temp:   (!) 97.4 F (36.3 C)   TempSrc:   Oral   SpO2: 95% 96% 95% 94%  Weight:       Height:        Intake/Output Summary (Last 24 hours) at 12/24/2024 1746 Last data filed at 12/24/2024 0500 Gross per 24 hour  Intake 480 ml  Output 600 ml  Net -120 ml   Filed Weights   12/22/24 0805 12/22/24 1656  Weight: 62.6 kg 60.3 kg    Physical Exam  Gen:- Awake Alert, cooperative and in no acute distress HEENT:- [place].AT, No sclera icterus Nose- [place] 3L/min Neck-Supple Neck,No JVD,.  Lungs-Minich breath sounds with few scattered wheezes  CV- S1, S2 normal, regular  Abd-  +ve B.Sounds, Abd Soft, No tenderness,    Extremity/Skin:- No  edema, pedal pulses present  Psych-affect is appropriate, mostly oriented, Neuro-generalized weakness, no new focal deficits, no tremors  Data Reviewed: I have personally reviewed following labs and imaging studies  CBC: Recent Labs  Lab  12/22/24 0848  WBC 7.1  NEUTROABS 4.5  HGB 11.6*  HCT 37.0  MCV 90.7  PLT 368   Basic Metabolic Panel: Recent Labs  Lab 12/22/24 0848  NA 135  K 4.0  CL 99  CO2 20*  GLUCOSE 63*  BUN 19  CREATININE 0.68  CALCIUM  8.2*   GFR: Estimated Creatinine Clearance: 49.9 mL/min (by C-G formula based on SCr of 0.68 mg/dL). Liver Function Tests: Recent Labs  Lab 12/22/24 0848  AST 37  ALT 35  ALKPHOS 65  BILITOT 0.4  PROT 6.3*  ALBUMIN 3.4*   Recent Labs    11/08/24 1329 12/22/24 0848  PROBNP 1,197.0* 191.0   Recent Results (from the past 240 hours)  Resp panel by RT-PCR (RSV, Flu A&B, Covid) Anterior Nasal Swab     Status: Abnormal   Collection Time: 12/21/24  2:00 PM   Specimen: Anterior Nasal Swab  Result Value Ref Range Status   SARS Coronavirus 2 by RT PCR NEGATIVE NEGATIVE Final    Comment: (NOTE) SARS-CoV-2 target nucleic acids are NOT DETECTED.  The SARS-CoV-2 RNA is generally detectable in upper respiratory specimens during the acute phase of infection. The lowest concentration of SARS-CoV-2 viral copies this assay can detect is 138 copies/mL. A negative result does not  preclude SARS-Cov-2 infection and should not be used as the sole basis for treatment or other patient management decisions. A negative result may occur with  improper specimen collection/handling, submission of specimen other than nasopharyngeal swab, presence of viral mutation(s) within the areas targeted by this assay, and inadequate number of viral copies(<138 copies/mL). A negative result must be combined with clinical observations, patient history, and epidemiological information. The expected result is Negative.  Fact Sheet for Patients:  bloggercourse.com  Fact Sheet for Healthcare Providers:  seriousbroker.it  This test is no t yet approved or cleared by the [place]  FDA and  has been authorized for detection and/or diagnosis of SARS-CoV-2 by FDA under an Emergency Use Authorization (EUA). This EUA will remain  in effect (meaning this test can be used) for the duration of the COVID-19 declaration under Section 564(b)(1) of the Act, 21 U.S.C.section 360bbb-3(b)(1), unless the authorization is terminated  or revoked sooner.       Influenza A by PCR POSITIVE (A) NEGATIVE Final   Influenza B by PCR NEGATIVE NEGATIVE Final    Comment: (NOTE) The Xpert Xpress SARS-CoV-2/FLU/RSV plus assay is intended as an aid in the diagnosis of influenza from Nasopharyngeal swab specimens and should not be used as a sole basis for treatment. Nasal washings and aspirates are unacceptable for Xpert Xpress SARS-CoV-2/FLU/RSV testing.  Fact Sheet for Patients: bloggercourse.com  Fact Sheet for Healthcare Providers: seriousbroker.it  This test is not yet approved or cleared by the [place]  FDA and has been authorized for detection and/or diagnosis of SARS-CoV-2 by FDA under an Emergency Use Authorization (EUA). This EUA will remain in effect (meaning this test can be used) for the  duration of the COVID-19 declaration under Section 564(b)(1) of the Act, 21 U.S.C. section 360bbb-3(b)(1), unless the authorization is terminated or revoked.     Resp Syncytial Virus by PCR NEGATIVE NEGATIVE Final    Comment: (NOTE) Fact Sheet for Patients: bloggercourse.com  Fact Sheet for Healthcare Providers: seriousbroker.it  This test is not yet approved or cleared by the [place]  FDA and has been authorized for detection and/or diagnosis of SARS-CoV-2 by FDA under an Emergency Use Authorization (EUA). This EUA will remain in effect (  meaning this test can be used) for the duration of the COVID-19 declaration under Section 564(b)(1) of the Act, 21 U.S.C. section 360bbb-3(b)(1), unless the authorization is terminated or revoked.  Performed at [facility], [address] [place], [place] [address]   Blood Culture (routine x 2)     Status: None (Preliminary result)   Collection Time: 12/22/24  8:25 AM   Specimen: BLOOD  Result Value Ref Range Status   Specimen Description BLOOD LEFT ANTECUBITAL  Final   Special Requests   Final    BOTTLES DRAWN AEROBIC AND ANAEROBIC Blood Culture adequate volume   Culture   Final    NO GROWTH 2 DAYS Performed at [facility], [address], [place], [place] [address]    Report Status PENDING  Incomplete  Resp panel by RT-PCR (RSV, Flu A&B, Covid) Anterior Nasal Swab     Status: Abnormal   Collection Time: 12/22/24  8:40 AM   Specimen: Anterior Nasal Swab  Result Value Ref Range Status   SARS Coronavirus 2 by RT PCR NEGATIVE NEGATIVE Final    Comment: (NOTE) SARS-CoV-2 target nucleic acids are NOT DETECTED.  The SARS-CoV-2 RNA is generally detectable in upper respiratory specimens during the acute phase of infection. The lowest concentration of SARS-CoV-2 viral copies this assay can detect is 138 copies/mL. A negative result does not preclude SARS-Cov-2 infection and should not be  used as the sole basis for treatment or other patient management decisions. A negative result may occur with  improper specimen collection/handling, submission of specimen other than nasopharyngeal swab, presence of viral mutation(s) within the areas targeted by this assay, and inadequate number of viral copies(<138 copies/mL). A negative result must be combined with clinical observations, patient history, and epidemiological information. The expected result is Negative.  Fact Sheet for Patients:  bloggercourse.com  Fact Sheet for Healthcare Providers:  seriousbroker.it  This test is no t yet approved or cleared by the [place]  FDA and  has been authorized for detection and/or diagnosis of SARS-CoV-2 by FDA under an Emergency Use Authorization (EUA). This EUA will remain  in effect (meaning this test can be used) for the duration of the COVID-19 declaration under Section 564(b)(1) of the Act, 21 U.S.C.section 360bbb-3(b)(1), unless the authorization is terminated  or revoked sooner.       Influenza A by PCR POSITIVE (A) NEGATIVE Final   Influenza B by PCR NEGATIVE NEGATIVE Final    Comment: (NOTE) The Xpert Xpress SARS-CoV-2/FLU/RSV plus assay is intended as an aid in the diagnosis of influenza from Nasopharyngeal swab specimens and should not be used as a sole basis for treatment. Nasal washings and aspirates are unacceptable for Xpert Xpress SARS-CoV-2/FLU/RSV testing.  Fact Sheet for Patients: bloggercourse.com  Fact Sheet for Healthcare Providers: seriousbroker.it  This test is not yet approved or cleared by the [place]  FDA and has been authorized for detection and/or diagnosis of SARS-CoV-2 by FDA under an Emergency Use Authorization (EUA). This EUA will remain in effect (meaning this test can be used) for the duration of the COVID-19 declaration under Section  564(b)(1) of the Act, 21 U.S.C. section 360bbb-3(b)(1), unless the authorization is terminated or revoked.     Resp Syncytial Virus by PCR NEGATIVE NEGATIVE Final    Comment: (NOTE) Fact Sheet for Patients: bloggercourse.com  Fact Sheet for Healthcare Providers: seriousbroker.it  This test is not yet approved or cleared by the [place]  FDA and has been authorized for detection and/or diagnosis of SARS-CoV-2 by FDA  under an Emergency Use Authorization (EUA). This EUA will remain in effect (meaning this test can be used) for the duration of the COVID-19 declaration under Section 564(b)(1) of the Act, 21 U.S.C. section 360bbb-3(b)(1), unless the authorization is terminated or revoked.  Performed at [facility], [address] [place], [place] [address]   Blood Culture (routine x 2)     Status: None (Preliminary result)   Collection Time: 12/22/24  8:48 AM   Specimen: Left Antecubital; Blood  Result Value Ref Range Status   Specimen Description LEFT ANTECUBITAL AEROBIC BOTTLE ONLY  Final   Special Requests   Final    Blood Culture results may not be optimal due to an inadequate volume of blood received in culture bottles   Culture   Final    NO GROWTH 2 DAYS Performed at [facility], [address] [place], [place] [address]    Report Status PENDING  Incomplete    Radiology Studies: No results found.  Scheduled Meds:  aspirin  EC  81 mg Oral Q breakfast   atorvastatin   80 mg Oral Daily   cycloSPORINE   1 drop Both Eyes BID   dextromethorphan -guaiFENesin   1 tablet Oral BID   fluticasone  furoate-vilanterol  1 puff Inhalation Daily   heparin   5,000 Units Subcutaneous Q8H   insulin  aspart  0-6 Units Subcutaneous TID WC   ipratropium-albuterol   3 mL Nebulization TID   levothyroxine   50 mcg Oral Daily   methylPREDNISolone  (SOLU-MEDROL ) injection  40 mg Intravenous Q12H   midodrine   10 mg Oral TID WC   oseltamivir   75 mg  Oral BID   pantoprazole   40 mg Oral Daily   pregabalin   150 mg Oral BID   sodium chloride  flush  3 mL Intravenous Q12H   sodium chloride  flush  3 mL Intravenous Q12H   umeclidinium bromide   1 puff Inhalation Daily   venlafaxine   75 mg Oral BID WC   vortioxetine  HBr  10 mg Oral Daily  Continuous Infusions:   LOS: 2 days   Rendall Carwin M.D on 12/24/2024 at 5:46 PM  Go to [email or web address] - for contact info  [facility] - Office  [phone]  If 7PM-7AM, please contact night-coverage [email or web address] 12/24/2024, 5:46 PM    "

## 2024-12-24 NOTE — Plan of Care (Signed)

## 2024-12-24 NOTE — Inpatient Diabetes Management (Signed)
 Inpatient Diabetes Program Recommendations  AACE/ADA: New Consensus Statement on Inpatient Glycemic Control   Target Ranges:  Prepandial:   less than 140 mg/dL      Peak postprandial:   less than 180 mg/dL (1-2 hours)      Critically ill patients:  140 - 180 mg/dL    Latest Reference Range & Units 12/23/24 07:59 12/23/24 11:48 12/23/24 16:11 12/23/24 20:47 12/24/24 07:46 12/24/24 11:34  Glucose-Capillary 70 - 99 mg/dL 768 (H) 795 (H) 781 (H) 240 (H) 181 (H) 303 (H)   Review of Glycemic Control  Diabetes history: DM2 Outpatient Diabetes medications: Metformin  1000 mg BID Current orders for Inpatient glycemic control: Novolog  0-6 units TID with meals; Solumedrol 40 mg Q12H  Inpatient Diabetes Program Recommendations:    Insulin : If steroids are continued, may want to consider ordering Novolog  2 units TID with meals for meal coverage if patient eats at least 50% of meals.  Thanks, Earnie Gainer, RN, MSN, CDCES Diabetes Coordinator Inpatient Diabetes Program [phone] (Team Pager from 8am to 5pm)

## 2024-12-24 NOTE — Progress Notes (Signed)
 Mobility Specialist Progress Note:    12/24/24 1358  Mobility  Activity Pivoted/transferred from chair to bed  Level of Assistance Minimal assist, patient does 75% or more (+2)  Assistive Device Other (Comment) (1HHA)  Distance Ambulated (ft) 3 ft  Range of Motion/Exercises Active;All extremities  Activity Response Tolerated well  Mobility Referral Yes  Mobility visit 1 Mobility  Mobility Specialist Start Time (ACUTE ONLY) 1358  Mobility Specialist Stop Time (ACUTE ONLY) 1416  Mobility Specialist Time Calculation (min) (ACUTE ONLY) 18 min   Pt received in chair, NT requesting assistance transferring back to bed. Required MinA+2 to stand and pivot with 1 hand-held assist. Tolerated well, slightly confused. Alarm on, NT in room. All needs met.  Tanaka Gillen Mobility Specialist Please contact via Special Educational Needs Teacher or  Rehab office at [phone]

## 2024-12-24 NOTE — Progress Notes (Signed)
 Mobility Specialist Progress Note:    12/24/24 1025  Mobility  Activity Pivoted/transferred from bed to chair  Level of Assistance Minimal assist, patient does 75% or more  Assistive Device Other (Comment) (1 HHA)  Distance Ambulated (ft) 2 ft  Range of Motion/Exercises Active;All extremities  Activity Response Tolerated well  Mobility Referral Yes  Mobility visit 1 Mobility  Mobility Specialist Start Time (ACUTE ONLY) 1025  Mobility Specialist Stop Time (ACUTE ONLY) 1045  Mobility Specialist Time Calculation (min) (ACUTE ONLY) 20 min   Pt received supine, agreeable to mobility. Required MinA to stand and pivot with 1 hand-held assist. Tolerated well, slightly confused. Family in room, alarm on. Call bell in reach, all needs met.  Taylon Louison Mobility Specialist Please contact via Special Educational Needs Teacher or  Rehab office at [phone]

## 2024-12-25 LAB — GLUCOSE, CAPILLARY
Glucose-Capillary: 222 mg/dL — ABNORMAL HIGH (ref 70–99)
Glucose-Capillary: 307 mg/dL — ABNORMAL HIGH (ref 70–99)

## 2024-12-25 MED ORDER — PREDNISONE 20 MG PO TABS: 40.0000 mg | ORAL_TABLET | Freq: Every day | ORAL | 0 refills | Status: DC

## 2024-12-25 MED ORDER — OSELTAMIVIR PHOSPHATE 75 MG PO CAPS: 75.0000 mg | ORAL_CAPSULE | Freq: Two times a day (BID) | ORAL | 0 refills | Status: DC

## 2024-12-25 MED ORDER — IMVEXXY MAINTENANCE PACK 10 MCG VA INST: 1.0000 | VAGINAL_INSERT | VAGINAL | Status: AC

## 2024-12-25 MED ORDER — PREGABALIN 150 MG PO CAPS: 150.0000 mg | ORAL_CAPSULE | Freq: Two times a day (BID) | ORAL | Status: DC

## 2024-12-25 MED ORDER — PREDNISONE 20 MG PO TABS: 40.0000 mg | ORAL_TABLET | Freq: Every day | ORAL | Status: DC

## 2024-12-25 NOTE — Progress Notes (Addendum)
 [facility] and gave report to nurse Ola.Called daughter Olam and notified her of pending transfer.

## 2024-12-25 NOTE — TOC Transition Note (Signed)
 Transition of Care [facility]) - Discharge Note   Patient Details  Name: [name] White MRN: [id number] Date of Birth: 1941/10/30  Transition of Care [facility]) CM/SW Contact:  Sharlyne Stabs, RN Phone Number: 12/25/2024, 1:38 PM   Clinical Narrative:   MD completed peer to peer, Auth approved. Kerri provided number for Report. RN calling. Daughter at the bedside.    Final next level of care: Skilled Nursing Facility Barriers to Discharge: Barriers Resolved   Patient Goals and CMS Choice Patient states their goals for this hospitalization and ongoing recovery are:: return to SNF   Choice offered to / list presented to : Adult Children      Discharge Placement               Patient to be transferred to facility by: Staff Name of family member notified: Daughter at the Bedside Patient and family notified of of transfer: 12/25/24  Discharge Plan and Services Additional resources added to the After Visit Summary for   In-house Referral: Clinical Social Work   Post Acute Care Choice: Skilled Nursing Facility                Social Drivers of Health (SDOH) Interventions SDOH Screenings   Food Insecurity: Patient Unable To Answer (12/22/2024)  Housing: Unknown (12/22/2024)  Transportation Needs: Patient Unable To Answer (12/22/2024)  Utilities: Patient Unable To Answer (12/22/2024)  Depression (PHQ2-9): Low Risk (10/28/2024)  Social Connections: Patient Unable To Answer (12/22/2024)  Recent Concern: Social Connections - Socially Isolated (11/08/2024)  Tobacco Use: Low Risk (12/22/2024)     Readmission Risk Interventions    12/23/2024    8:42 AM 12/16/2024    9:47 AM 12/14/2024   10:51 AM  Readmission Risk Prevention Plan  Transportation Screening Complete Complete Complete  Medication Review Oceanographer) Complete Complete Complete  HRI or Home Care Consult Complete Complete Complete  SW Recovery Care/Counseling Consult Complete Complete Complete  Palliative Care  Screening Not Applicable Not Applicable Not Applicable  Skilled Nursing Facility Complete Complete Complete

## 2024-12-25 NOTE — TOC Progression Note (Signed)
 Transition of Care [facility]) - Progression Note    Patient Details  Name: [name] White MRN: [id number] Date of Birth: 16-Dec-1941  [facility]) CM/SW Contact  Sharlyne Stabs, RN Phone Number: 12/25/2024, 1:21 PM  Clinical Narrative:   Insurance is  offering the treating practitioner an option to speak with a Wellsite Geologist before final determination. Provider to call: [phone] Option 5. Deadline is: 12/25/24 5:00 PM EST If no response, MD will render determination. Team updated. Daughter at the beside.    Expected Discharge Plan: Skilled Nursing Facility Barriers to Discharge: Continued Medical Work up      Expected Discharge Plan and Services In-house Referral: Clinical Social Work   Post Acute Care Choice: Skilled Nursing Facility Living arrangements for the past 2 months: Skilled Nursing Facility Expected Discharge Date: 12/24/24                      Social Drivers of Health (SDOH) Interventions SDOH Screenings   Food Insecurity: Patient Unable To Answer (12/22/2024)  Housing: Unknown (12/22/2024)  Transportation Needs: Patient Unable To Answer (12/22/2024)  Utilities: Patient Unable To Answer (12/22/2024)  Depression (PHQ2-9): Low Risk (10/28/2024)  Social Connections: Patient Unable To Answer (12/22/2024)  Recent Concern: Social Connections - Socially Isolated (11/08/2024)  Tobacco Use: Low Risk (12/22/2024)    Readmission Risk Interventions    12/23/2024    8:42 AM 12/16/2024    9:47 AM 12/14/2024   10:51 AM  Readmission Risk Prevention Plan  Transportation Screening Complete Complete Complete  Medication Review Oceanographer) Complete Complete Complete  HRI or Home Care Consult Complete Complete Complete  SW Recovery Care/Counseling Consult Complete Complete Complete  Palliative Care Screening Not Applicable Not Applicable Not Applicable  Skilled Nursing Facility Complete Complete Complete

## 2024-12-25 NOTE — Plan of Care (Signed)

## 2024-12-25 NOTE — Inpatient Diabetes Management (Signed)
 Inpatient Diabetes Program Recommendations  AACE/ADA: New Consensus Statement on Inpatient Glycemic Control   Target Ranges:  Prepandial:   less than 140 mg/dL      Peak postprandial:   less than 180 mg/dL (1-2 hours)      Critically ill patients:  140 - 180 mg/dL    Latest Reference Range & Units 12/24/24 07:46 12/24/24 11:34 12/24/24 16:19 12/24/24 21:47 12/25/24 07:40  Glucose-Capillary 70 - 99 mg/dL 818 (H) 696 (H) 823 (H) 315 (H) 222 (H)   Review of Glycemic Control  Diabetes history: DM2 Outpatient Diabetes medications: Metformin  1000 mg BID Current orders for Inpatient glycemic control: Novolog  0-6 units TID with meals; Solumedrol 40 mg Q12H   Inpatient Diabetes Program Recommendations:     Insulin : If steroids are continued, may want to consider ordering Novolog  2 units TID with meals for meal coverage if patient eats at least 50% of meals.  Thanks, Earnie Gainer, RN, MSN, CDCES Diabetes Coordinator Inpatient Diabetes Program [phone] (Team Pager from 8am to 5pm)

## 2024-12-25 NOTE — Progress Notes (Signed)
 Mobility Specialist Progress Note:    12/25/24 1000  Mobility  Activity Pivoted/transferred from bed to chair  Level of Assistance Minimal assist, patient does 75% or more (+2)  Assistive Device None  Distance Ambulated (ft) 2 ft  Range of Motion/Exercises Active;All extremities  Activity Response Tolerated well  Mobility Referral Yes  Mobility visit 1 Mobility  Mobility Specialist Start Time (ACUTE ONLY) 1000  Mobility Specialist Stop Time (ACUTE ONLY) 1020  Mobility Specialist Time Calculation (min) (ACUTE ONLY) 20 min   Pt received supine, agreeable to mobility. Required MinA +2 to stand and pivot with no AD. Tolerated well, slightly confused. Alarm on, call bell in reach. NT in room, all needs met.  Nelva Hauk Mobility Specialist Please contact via Special Educational Needs Teacher or  Rehab office at [phone]

## 2024-12-25 NOTE — Discharge Instructions (Signed)
 [name] White

## 2024-12-25 NOTE — Discharge Summary (Signed)
 " Physician Discharge Summary   Patient: [name] White MRN: [id number] DOB: [birth year]  Admit date:     12/22/2024  Discharge date: 12/25/2024  Discharge Physician: Deliliah Room   PCP: [name], MD   Recommendations at discharge:    F/u with your PCP in one week Continue taking meds as prescribed  Discharge Diagnoses: Principal Problem:   Acute metabolic encephalopathy Active Problems:   Influenza A   Acute hypoxic respiratory failure (HCC)   Hypothyroidism   Essential hypertension   Type 2 diabetes mellitus with peripheral neuropathy (HCC)   Anxiety and depression   GERD without esophagitis   Uncontrolled type 2 diabetes mellitus with hyperglycemia, with long-term current use of insulin  (HCC)   Vascular dementia [facility])    Hospital Course:  83 y.o. female with medical history significant DM2,  HTN, hypothyroidism, vascular dementia, malnutrition, history of CVAs, asthma, chronic anemia and B12 deficiency admitted on 12/22/2024 with acute metabolic encephalopathy in the setting of influenza A infection.   influenza A infection--- flu symptoms have improved significantly --hypoxia has resolved - Complete course of Tamiflu .    Acute hypoxic respiratory failure due to Influenza - Respiratory status has improved significantly -Hypoxia is resolved   acute metabolic encephalopathy--due to above -CT head without contrast shows no new acute intracranial findings -Mentation is back to baseline at this time  acute COPD exacerbation with acute hypoxic respiratory failure---  - Treated with IV Solu-Medrol , bronchodilators and mucolytics as ordered Improved significantly with above measures -Weaned off oxygen completely currently on room air -Switched to oral prednisone  40 mg daily x 3 days on discharge.   DM2-recent A1c 6.5 reflecting good diabetic control PTA -Continue with home medications  GERD--continue Protonix    history of prior strokes--no new strokelike symptoms  at this time  -continue aspirin  and atorvastatin  for secondary prevention   Depression/history of vascular dementia--  -- Continue Effexor  and Trintellix  -Mentation is back to baseline   Hypothyroidism--continue levothyroxine    chronic pain/neuropathy--- continue Lyrica        Consultants: None Procedures performed: None  Disposition: Skilled nursing facility Diet recommendation:  Cardiac and Carb modified diet DISCHARGE MEDICATION: Allergies as of 12/25/2024       Reactions   Betadine [povidone Iodine] Hives   Povidone-iodine Hives   Penicillins Rash, Dermatitis        Medication List     TAKE these medications    acetaminophen  325 MG tablet Commonly known as: TYLENOL  Take 2 tablets (650 mg total) by mouth every 6 (six) hours as needed for mild pain (pain score 1-3) (or Fever >/= 101).   albuterol  108 (90 Base) MCG/ACT inhaler Commonly known as: VENTOLIN  HFA Inhale 2 puffs into the lungs every 4 (four) hours as needed for wheezing or shortness of breath.   aspirin  EC 81 MG tablet Take 81 mg by mouth every morning. Swallow whole.   atorvastatin  80 MG tablet Commonly known as: LIPITOR Take 80 mg by mouth every morning.   busPIRone  5 MG tablet Commonly known as: BUSPAR  Take 5 mg by mouth 3 (three) times daily as needed (anxiety).   CENTRUM SILVER PO Take 1 tablet by mouth every morning.   cyanocobalamin  1000 MCG/ML injection Commonly known as: VITAMIN B12 Inject 1,000 mcg into the muscle every 30 (thirty) days. Given on the 23rd of each month   cycloSPORINE  0.05 % ophthalmic emulsion Commonly known as: RESTASIS  Place 1 drop into both eyes 2 (two) times daily. Given at 0900 and 2100  fluticasone  furoate-vilanterol 200-25 MCG/ACT Aepb Commonly known as: Breo Ellipta  Inhale 1 puff into the lungs daily.   furosemide  20 MG tablet Commonly known as: Lasix  Take 1 tablet (20 mg total) by mouth daily.   Imvexxy  Maintenance Pack 10 MCG Inst Generic drug:  Estradiol  Place 1 suppository vaginally 2 (two) times a week. Mondays and Thursdays Start taking on: December 26, 2024   ipratropium-albuterol  0.5-2.5 (3) MG/3ML Soln Commonly known as: DUONEB Take 3 mLs by nebulization 3 (three) times daily. Given at 0900, 1200 and 2100 What changed: Another medication with the same name was changed. Make sure you understand how and when to take each.   ipratropium-albuterol  0.5-2.5 (3) MG/3ML Soln Commonly known as: DUONEB Take 3 mLs by nebulization every 4 (four) hours as needed (wheezing and SOB). What changed: reasons to take this   levothyroxine  50 MCG tablet Commonly known as: SYNTHROID  Take 50 mcg by mouth daily before breakfast.   metFORMIN  1000 MG tablet Commonly known as: GLUCOPHAGE  Take 1,000 mg by mouth 2 (two) times daily. Given at 0800 and 1800   midodrine  5 MG tablet Commonly known as: PROAMATINE  Take 5 mg by mouth 3 (three) times daily with meals. Given at 0800, 1200 and 1800   nitrofurantoin  50 MG capsule Commonly known as: Macrodantin  Take 1 capsule (50 mg total) by mouth at bedtime.   omeprazole  20 MG capsule Commonly known as: PRILOSEC Take 1 capsule (20 mg total) by mouth 2 (two) times daily before a meal.   oseltamivir  75 MG capsule Commonly known as: TAMIFLU  Take 1 capsule (75 mg total) by mouth 2 (two) times daily. What changed:  medication strength how much to take additional instructions   polyethylene glycol 17 g packet Commonly known as: MIRALAX  / GLYCOLAX  Take 17 g by mouth daily as needed for mild constipation.   potassium chloride  10 MEQ tablet Commonly known as: KLOR-CON  Take 10 mEq by mouth every morning.   predniSONE  20 MG tablet Commonly known as: DELTASONE  Take 2 tablets (40 mg total) by mouth daily with breakfast. Start taking on: December 26, 2024   pregabalin  150 MG capsule Commonly known as: LYRICA  Take 1 capsule (150 mg total) by mouth 2 (two) times daily. Given at 0900 and 2100    rizatriptan  10 MG tablet Commonly known as: MAXALT  Take 10 mg by mouth daily as needed for migraine.   tiZANidine  2 MG tablet Commonly known as: ZANAFLEX  Take 2 mg by mouth 2 (two) times daily as needed for muscle spasms.   traZODone  50 MG tablet Commonly known as: DESYREL  Take 1 tablet (50 mg total) by mouth at bedtime as needed for sleep.   Trintellix  10 MG Tabs tablet Generic drug: vortioxetine  HBr Take 10 mg by mouth every morning.   umeclidinium bromide  62.5 MCG/ACT Aepb Commonly known as: INCRUSE ELLIPTA  Inhale 1 puff into the lungs daily.   venlafaxine  XR 150 MG 24 hr capsule Commonly known as: EFFEXOR -XR Take 150 mg by mouth every morning.        Follow-up Information     [name], MD. Schedule an appointment as soon as possible for a visit in 1 week(s).   Specialty: Internal Medicine Contact information: [address] [place] [address] [phone]                Discharge Exam: Filed Weights   12/22/24 0805 12/22/24 1656  Weight: 62.6 kg 60.3 kg   Constitutional: NAD, calm, comfortable Eyes: PERRL, lids and conjunctivae  normal ENMT: Mucous membranes are moist. Posterior pharynx clear of any exudate or lesions.Normal dentition.  Neck: normal, supple, no masses, no thyromegaly Respiratory: clear to auscultation bilaterally, no wheezing, no crackles. Normal respiratory effort. No accessory muscle use.  Cardiovascular: Regular rate and rhythm, no murmurs / rubs / gallops. No extremity edema. 2+ pedal pulses. No carotid bruits.  Abdomen: no tenderness, no masses palpated. No hepatosplenomegaly. Bowel sounds positive.  Musculoskeletal: no clubbing / cyanosis. No joint deformity upper and lower extremities. Good ROM, no contractures. Normal muscle tone.  Skin: no rashes, lesions, ulcers. No induration Neurologic: CN 2-12 grossly intact. Sensation intact, DTR normal. Strength 5/5 x all 4 extremities.    Condition at discharge: good  The  results of significant diagnostics from this hospitalization (including imaging, microbiology, ancillary and laboratory) are listed below for reference.   Imaging Studies: CT Head Wo Contrast Result Date: 12/22/2024 EXAM: CT HEAD WITHOUT CONTRAST 12/22/2024 09:27:18 AM TECHNIQUE: CT of the head was performed without the administration of intravenous contrast. Automated exposure control, iterative reconstruction, and/or weight based adjustment of the mA/kV was utilized to reduce the radiation dose to as low as reasonably achievable. COMPARISON: Recent brain MRI 12/10/2024 and earlier studies. CLINICAL HISTORY: 83 year old female with altered mental status and increased confusion. FINDINGS: BRAIN AND VENTRICLES: No acute hemorrhage. No evidence of acute infarct. No hydrocephalus. No extra-axial collection. No mass effect or midline shift. Advanced chronic cerebral white matter disease with scattered patchy and confluent hypodensity is stable. Chronic bilateral cerebellar infarcts right greater than left. Stable gray white differentiation otherwise. No suspicious intracranial vascular hyperdensity. Chronic skull base invasive pituitary adenoma, stable appearance of invasion of the sphenoid sinus and central clivus by CT (series 4 images 28 and 29). Suprasellar component of the mass appears stable partially effacing the suprasellar cistern and obscuring the optic chiasm on coronal image 307. Calcified atherosclerosis at the skull base. No suspicious intracranial vascular hyperdensity. ORBITS: No acute abnormality. SINUSES: Increased ethmoid and frontal sinus mucosal thickening which appears inflammatory. Chronic postoperative changes to the posterior nasal cavity. Tympanic cavities and mastoids remain well aerated. SOFT TISSUES AND SKULL: No acute soft tissue abnormality. No skull fracture. IMPRESSION: 1. Stable non-contrast CT appearance of chronic skull base invasive pituitary macro-adenoma, chronic brain  ischemic disease. 2. Mild new paranasal sinus inflammation. 3. No acute intracranial abnormality. Electronically signed by: Helayne Hurst MD 12/22/2024 10:05 AM EST RP Workstation: [id number]   DG Chest Port 1 View Result Date: 12/22/2024 CLINICAL DATA:  Shortness of breath. Confusion. Hypotension. Suspected sepsis. EXAM: PORTABLE CHEST 1 VIEW COMPARISON:  12/10/2024 FINDINGS: Heart size remains within normal limits. Low lung volumes again noted. New pleural based opacity seen along the left lateral chest wall, suspicious for small layering left pleural effusion. Lungs are otherwise clear. IMPRESSION: Probable small layering left pleural effusion. Electronically Signed   By: Norleen DELENA Kil M.D.   On: 12/22/2024 08:48   MR BRAIN WO CONTRAST Result Date: 12/10/2024 EXAM: MRI BRAIN WITHOUT CONTRAST 12/10/2024 05:50:24 PM TECHNIQUE: Multiplanar multisequence MRI of the head/brain was performed without the administration of intravenous contrast. COMPARISON: Comparison made with CT from earlier the same day, as well as prior exam from 10/25/2024. CLINICAL HISTORY: Mental status change, unknown cause. FINDINGS: BRAIN AND VENTRICLES: No acute infarct. No intracranial hemorrhage. No mass. No midline shift. No hydrocephalus. Generalized age-related cerebral atrophy. Patchy and confluent T2/FLAIR hyperintensity involving the periventricular and deep white matter of the hemispheres, consistent with chronic small vessel ischemic disease, moderate in nature.  Multiple remote lacunar infarcts present about the hemispheric cerebral white matter. Scattered chronic remote bilateral cerebellar infarcts noted. Previously identified pituitary macroadenoma measures up to approximately 2.4 cm, relatively stable from prior. Normal flow voids. ORBITS: Prior bilateral ocular lens replacement. SINUSES AND MASTOIDS: No acute abnormality. BONES AND SOFT TISSUES: Normal marrow signal. No acute soft tissue abnormality. IMPRESSION: 1. No acute  intracranial abnormality. 2. Moderate chronic small vessel ischemic disease with multiple remote lacunar infarcts about the hemispheric cerebral white matter and cerebellum. 3. 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 Electronically signed by: Morene Hoard MD 12/10/2024 06:34 PM EST RP Workstation: [id number]   CT Head Wo Contrast Result Date: 12/10/2024 EXAM: CT HEAD WITHOUT CONTRAST 12/10/2024 03:32:44 PM TECHNIQUE: CT of the head was performed without the administration of intravenous contrast. Automated exposure control, iterative reconstruction, and/or weight based adjustment of the mA/kV was utilized to reduce the radiation dose to as low as reasonably achievable. COMPARISON: 10/12/2024 CLINICAL HISTORY: Altered mental status, nontraumatic (Ped 0-17y) FINDINGS: BRAIN AND VENTRICLES: No acute hemorrhage. No evidence of acute infarct. Global parenchymal atrophy with prominent CSF spaces and sulci. Periventricular and subcortical white matter patchy hypodensities, consistent with chronic small-vessel ischemic changes. Old right cerebellar infarct. Additional small remote left cerebellar infarct. Postsurgical changes of prior transsphenoidal resection of pituitary tumor. There is residual slightly hyperattenuating soft tissue along the superior aspect of the sella extending to the suprasellar cistern, better evaluated on recent prior MRI. Postsurgical changes of the sella. No hydrocephalus. No extra-axial collection. No mass effect or midline shift. ORBITS: Bilateral lens replacement. Vascular calcifications. No acute abnormality. SINUSES: No acute abnormality. SOFT TISSUES AND SKULL: No acute soft tissue abnormality. No skull fracture. IMPRESSION: 1. No acute intracranial abnormality. 2. Global parenchymal atrophy, chronic small-vessel ischemic changes, and remote cerebellar infarcts. 3. Postsurgical changes of transsphenoidal resection of pituitary tumor with residual mass  in the sella and suprasellar cistern, better evaluated on recent prior MRI. Electronically signed by: Donnice Mania MD 12/10/2024 04:01 PM EST RP Workstation: [id number]   DG Chest Portable 1 View Result Date: 12/10/2024 CLINICAL DATA:  Altered mental status EXAM: PORTABLE CHEST 1 VIEW COMPARISON:  November 08, 2024 FINDINGS: Stable cardiomediastinal silhouette. No definite acute pulmonary abnormality seen. The visualized skeletal structures are unremarkable. IMPRESSION: No active disease. Electronically Signed   By: Lynwood Landy Raddle M.D.   On: 12/10/2024 14:26    Microbiology: Results for orders placed or performed during the hospital encounter of 12/22/24  Blood Culture (routine x 2)     Status: None (Preliminary result)   Collection Time: 12/22/24  8:25 AM   Specimen: BLOOD  Result Value Ref Range Status   Specimen Description BLOOD LEFT ANTECUBITAL  Final   Special Requests   Final    BOTTLES DRAWN AEROBIC AND ANAEROBIC Blood Culture adequate volume   Culture   Final    NO GROWTH 3 DAYS Performed at [facility], [address], [place], [place] [address]    Report Status PENDING  Incomplete  Resp panel by RT-PCR (RSV, Flu A&B, Covid) Anterior Nasal Swab     Status: Abnormal   Collection Time: 12/22/24  8:40 AM   Specimen: Anterior Nasal Swab  Result Value Ref Range Status   SARS Coronavirus 2 by RT PCR NEGATIVE NEGATIVE Final    Comment: (NOTE) SARS-CoV-2 target nucleic acids are NOT DETECTED.  The SARS-CoV-2 RNA is generally detectable in upper respiratory specimens during the acute phase of infection. The lowest concentration of  SARS-CoV-2 viral copies this assay can detect is 138 copies/mL. A negative result does not preclude SARS-Cov-2 infection and should not be used as the sole basis for treatment or other patient management decisions. A negative result may occur with  improper specimen collection/handling, submission of specimen other than nasopharyngeal swab, presence of  viral mutation(s) within the areas targeted by this assay, and inadequate number of viral copies(<138 copies/mL). A negative result must be combined with clinical observations, patient history, and epidemiological information. The expected result is Negative.  Fact Sheet for Patients:  bloggercourse.com  Fact Sheet for Healthcare Providers:  seriousbroker.it  This test is no t yet approved or cleared by the [place]  FDA and  has been authorized for detection and/or diagnosis of SARS-CoV-2 by FDA under an Emergency Use Authorization (EUA). This EUA will remain  in effect (meaning this test can be used) for the duration of the COVID-19 declaration under Section 564(b)(1) of the Act, 21 U.S.C.section 360bbb-3(b)(1), unless the authorization is terminated  or revoked sooner.       Influenza A by PCR POSITIVE (A) NEGATIVE Final   Influenza B by PCR NEGATIVE NEGATIVE Final    Comment: (NOTE) The Xpert Xpress SARS-CoV-2/FLU/RSV plus assay is intended as an aid in the diagnosis of influenza from Nasopharyngeal swab specimens and should not be used as a sole basis for treatment. Nasal washings and aspirates are unacceptable for Xpert Xpress SARS-CoV-2/FLU/RSV testing.  Fact Sheet for Patients: bloggercourse.com  Fact Sheet for Healthcare Providers: seriousbroker.it  This test is not yet approved or cleared by the [place]  FDA and has been authorized for detection and/or diagnosis of SARS-CoV-2 by FDA under an Emergency Use Authorization (EUA). This EUA will remain in effect (meaning this test can be used) for the duration of the COVID-19 declaration under Section 564(b)(1) of the Act, 21 U.S.C. section 360bbb-3(b)(1), unless the authorization is terminated or revoked.     Resp Syncytial Virus by PCR NEGATIVE NEGATIVE Final    Comment: (NOTE) Fact Sheet for  Patients: bloggercourse.com  Fact Sheet for Healthcare Providers: seriousbroker.it  This test is not yet approved or cleared by the [place]  FDA and has been authorized for detection and/or diagnosis of SARS-CoV-2 by FDA under an Emergency Use Authorization (EUA). This EUA will remain in effect (meaning this test can be used) for the duration of the COVID-19 declaration under Section 564(b)(1) of the Act, 21 U.S.C. section 360bbb-3(b)(1), unless the authorization is terminated or revoked.  Performed at [facility], [address] [place], [place] [address]   Blood Culture (routine x 2)     Status: None (Preliminary result)   Collection Time: 12/22/24  8:48 AM   Specimen: Left Antecubital; Blood  Result Value Ref Range Status   Specimen Description LEFT ANTECUBITAL AEROBIC BOTTLE ONLY  Final   Special Requests   Final    Blood Culture results may not be optimal due to an inadequate volume of blood received in culture bottles   Culture   Final    NO GROWTH 3 DAYS Performed at [facility], [address], [place], [place] [address]    Report Status PENDING  Incomplete    Labs: CBC: Recent Labs  Lab 12/22/24 0848  WBC 7.1  NEUTROABS 4.5  HGB 11.6*  HCT 37.0  MCV 90.7  PLT 368   Basic Metabolic Panel: Recent Labs  Lab 12/22/24 0848  NA 135  K 4.0  CL 99  CO2 20*  GLUCOSE 63*  BUN  19  CREATININE 0.68  CALCIUM  8.2*   Liver Function Tests: Recent Labs  Lab 12/22/24 0848  AST 37  ALT 35  ALKPHOS 65  BILITOT 0.4  PROT 6.3*  ALBUMIN 3.4*   CBG: Recent Labs  Lab 12/24/24 1134 12/24/24 1619 12/24/24 2147 12/25/24 0740 12/25/24 1128  GLUCAP 303* 176* 315* 222* 307*    Discharge time spent: 40 minutes.  Signed: Deliliah Room, MD [facility] 12/25/2024 "

## 2024-12-27 ENCOUNTER — Emergency Department (HOSPITAL_COMMUNITY)
Admission: EM | Admit: 2024-12-27 | Discharge: 2024-12-28 | Disposition: A | Discharge status: Skilled Nursing Facility | Source: Home / Self Care | Attending: Emergency Medicine | Admitting: Emergency Medicine

## 2024-12-27 ENCOUNTER — Encounter: Payer: Self-pay | Admitting: Internal Medicine

## 2024-12-27 ENCOUNTER — Non-Acute Institutional Stay: Payer: Self-pay | Admitting: Adult Health

## 2024-12-27 ENCOUNTER — Encounter (HOSPITAL_COMMUNITY): Payer: Self-pay | Admitting: Emergency Medicine

## 2024-12-27 ENCOUNTER — Emergency Department (HOSPITAL_COMMUNITY)

## 2024-12-27 ENCOUNTER — Encounter: Payer: Self-pay | Admitting: Adult Health

## 2024-12-27 DIAGNOSIS — J101 Influenza due to other identified influenza virus with other respiratory manifestations: Secondary | ICD-10-CM | POA: Insufficient documentation

## 2024-12-27 DIAGNOSIS — E039 Hypothyroidism, unspecified: Secondary | ICD-10-CM | POA: Insufficient documentation

## 2024-12-27 DIAGNOSIS — J9601 Acute respiratory failure with hypoxia: Secondary | ICD-10-CM | POA: Diagnosis not present

## 2024-12-27 DIAGNOSIS — Z7982 Long term (current) use of aspirin: Secondary | ICD-10-CM | POA: Diagnosis not present

## 2024-12-27 DIAGNOSIS — J441 Chronic obstructive pulmonary disease with (acute) exacerbation: Secondary | ICD-10-CM | POA: Diagnosis not present

## 2024-12-27 DIAGNOSIS — K219 Gastro-esophageal reflux disease without esophagitis: Secondary | ICD-10-CM | POA: Diagnosis not present

## 2024-12-27 DIAGNOSIS — Z794 Long term (current) use of insulin: Secondary | ICD-10-CM | POA: Diagnosis not present

## 2024-12-27 DIAGNOSIS — I10 Essential (primary) hypertension: Secondary | ICD-10-CM | POA: Diagnosis not present

## 2024-12-27 DIAGNOSIS — R739 Hyperglycemia, unspecified: Secondary | ICD-10-CM

## 2024-12-27 DIAGNOSIS — E1142 Type 2 diabetes mellitus with diabetic polyneuropathy: Secondary | ICD-10-CM

## 2024-12-27 DIAGNOSIS — F0390 Unspecified dementia without behavioral disturbance: Secondary | ICD-10-CM | POA: Insufficient documentation

## 2024-12-27 DIAGNOSIS — E1165 Type 2 diabetes mellitus with hyperglycemia: Secondary | ICD-10-CM | POA: Diagnosis not present

## 2024-12-27 DIAGNOSIS — Z7984 Long term (current) use of oral hypoglycemic drugs: Secondary | ICD-10-CM

## 2024-12-27 DIAGNOSIS — I959 Hypotension, unspecified: Secondary | ICD-10-CM

## 2024-12-27 LAB — BLOOD GAS, VENOUS
Acid-Base Excess: 7.5 mmol/L — ABNORMAL HIGH (ref 0.0–2.0)
Bicarbonate: 31.1 mmol/L — ABNORMAL HIGH (ref 20.0–28.0)
Drawn by: 7049
O2 Saturation: 82.6 %
Patient temperature: 36.5
pCO2, Ven: 38 mmHg — ABNORMAL LOW (ref 44–60)
pH, Ven: 7.52 — ABNORMAL HIGH (ref 7.25–7.43)
pO2, Ven: 46 mmHg — ABNORMAL HIGH (ref 32–45)

## 2024-12-27 LAB — I-STAT CHEM 8, ED
BUN: 17 mg/dL (ref 8–23)
Calcium, Ion: 1.1 mmol/L — ABNORMAL LOW (ref 1.15–1.40)
Chloride: 93 mmol/L — ABNORMAL LOW (ref 98–111)
Creatinine, Ser: 0.8 mg/dL (ref 0.44–1.00)
Glucose, Bld: 514 mg/dL (ref 70–99)
HCT: 38 % (ref 36.0–46.0)
Hemoglobin: 12.9 g/dL (ref 12.0–15.0)
Potassium: 4.4 mmol/L (ref 3.5–5.1)
Sodium: 133 mmol/L — ABNORMAL LOW (ref 135–145)
TCO2: 24 mmol/L (ref 22–32)

## 2024-12-27 LAB — CULTURE, BLOOD (ROUTINE X 2)
Culture: NO GROWTH
Culture: NO GROWTH
Special Requests: ADEQUATE

## 2024-12-27 LAB — COMPREHENSIVE METABOLIC PANEL WITH GFR
ALT: 47 U/L — ABNORMAL HIGH (ref 0–44)
AST: 21 U/L (ref 15–41)
Albumin: 4 g/dL (ref 3.5–5.0)
Alkaline Phosphatase: 59 U/L (ref 38–126)
Anion gap: 20 — ABNORMAL HIGH (ref 5–15)
BUN: 18 mg/dL (ref 8–23)
CO2: 22 mmol/L (ref 22–32)
Calcium: 9.1 mg/dL (ref 8.9–10.3)
Chloride: 92 mmol/L — ABNORMAL LOW (ref 98–111)
Creatinine, Ser: 0.8 mg/dL (ref 0.44–1.00)
GFR, Estimated: >60 mL/min
Glucose, Bld: 517 mg/dL (ref 70–99)
Potassium: 4.6 mmol/L (ref 3.5–5.1)
Sodium: 134 mmol/L — ABNORMAL LOW (ref 135–145)
Total Bilirubin: 0.4 mg/dL (ref 0.0–1.2)
Total Protein: 7 g/dL (ref 6.5–8.1)

## 2024-12-27 LAB — CBG MONITORING, ED
Glucose-Capillary: 509 mg/dL (ref 70–99)
Glucose-Capillary: 205 mg/dL — ABNORMAL HIGH (ref 70–99)

## 2024-12-27 LAB — CBC
HCT: 35.6 % — ABNORMAL LOW (ref 36.0–46.0)
Hemoglobin: 11.6 g/dL — ABNORMAL LOW (ref 12.0–15.0)
MCH: 28.5 pg (ref 26.0–34.0)
MCHC: 32.6 g/dL (ref 30.0–36.0)
MCV: 87.5 fL (ref 80.0–100.0)
Platelets: 458 K/uL — ABNORMAL HIGH (ref 150–400)
RBC: 4.07 MIL/uL (ref 3.87–5.11)
RDW: 16.3 % — ABNORMAL HIGH (ref 11.5–15.5)
WBC: 18 K/uL — ABNORMAL HIGH (ref 4.0–10.5)
nRBC: 0.6 % — ABNORMAL HIGH (ref 0.0–0.2)

## 2024-12-27 LAB — RESP PANEL BY RT-PCR (RSV, FLU A&B, COVID)  RVPGX2
Influenza A by PCR: POSITIVE — AB
Influenza B by PCR: NEGATIVE
Resp Syncytial Virus by PCR: NEGATIVE
SARS Coronavirus 2 by RT PCR: NEGATIVE

## 2024-12-27 MED ORDER — INSULIN REGULAR BOLUS VIA INFUSION: 10.0000 [IU] | Freq: Once | INTRAVENOUS | Status: DC

## 2024-12-27 MED ORDER — INSULIN ASPART 100 UNIT/ML IJ SOLN: 5.0000 [IU] | Freq: Three times a day (TID) | INTRAMUSCULAR | 11 refills | Status: DC

## 2024-12-27 MED ORDER — SODIUM CHLORIDE 0.9 % IV BOLUS
1000.0000 mL | Freq: Once | INTRAVENOUS | Status: AC
  Administered 2024-12-27: 1000 mL via INTRAVENOUS

## 2024-12-27 MED ORDER — INSULIN ASPART 100 UNIT/ML IJ SOLN
10.0000 [IU] | Freq: Once | INTRAMUSCULAR | Status: AC
  Administered 2024-12-27: 10 [IU] via INTRAVENOUS
  Filled 2024-12-27: qty 1

## 2024-12-27 NOTE — ED Provider Notes (Signed)
 " [place] EMERGENCY DEPARTMENT AT [facility] Provider Note   CSN: [id number] Arrival date & time: 12/27/24  1934     Patient presents with: Hyperglycemia   [name] White is a 84 y.o. female.  Patient is an 84 year old female with a history of hypothyroidism, migraines, hypertension, CVA, vascular dementia, and type 2 diabetes who presents to the ED via EMS from [facility] nursing center for hypoglycemia.  The staff noted that her blood sugar was 572 at 5 PM in which they gave 8 units of NovoLog  4.  They then rechecked an hour later and was still 565 so they gave another 4 units.  At 7 PM her blood glucose was 590 so that she was sent to the ED for evaluation.  EMS noted a blood glucose of 526 as well as nausea and a headache.  She states she believes she has been given her insulin  as prescribed.  Denies hurting anywhere including chest pain or shortness of breath.  No further complaints.    Hyperglycemia Associated symptoms: nausea   Associated symptoms: no abdominal pain, no chest pain, no fever, no shortness of breath and no vomiting        Prior to Admission medications  Medication Sig Start Date End Date Taking? Authorizing Provider  insulin  aspart (NOVOLOG ) 100 UNIT/ML injection Inject 5 Units into the skin 3 (three) times daily before meals. 12/27/24  Yes Halei Hanover, Thersia GORMAN, PA-C  acetaminophen  (TYLENOL ) 325 MG tablet Take 2 tablets (650 mg total) by mouth every 6 (six) hours as needed for mild pain (pain score 1-3) (or Fever >/= 101). 01/01/24   ShahmehdiAdriana LABOR, MD  albuterol  (VENTOLIN  HFA) 108 (90 Base) MCG/ACT inhaler Inhale 2 puffs into the lungs every 4 (four) hours as needed for wheezing or shortness of breath. 05/01/24   Johnson, Clanford L, MD  aspirin  EC 81 MG tablet Take 81 mg by mouth every morning. Swallow whole.    [provider]  atorvastatin  (LIPITOR) 80 MG tablet Take 80 mg by mouth every morning.    [provider]  busPIRone  (BUSPAR ) 5 MG  tablet Take 5 mg by mouth 3 (three) times daily as needed (anxiety).    [provider]  cyanocobalamin  (VITAMIN B12) 1000 MCG/ML injection Inject 1,000 mcg into the muscle every 30 (thirty) days. Given on the 23rd of each month 07/26/24   [provider]  cycloSPORINE  (RESTASIS ) 0.05 % ophthalmic emulsion Place 1 drop into both eyes 2 (two) times daily. Given at 0900 and 2100    [provider]  Estradiol  (IMVEXXY  MAINTENANCE PACK) 10 MCG INST Place 1 suppository vaginally 2 (two) times a week. Mondays and Thursdays 12/26/24   Dino Antu, MD  fluticasone  furoate-vilanterol (BREO ELLIPTA ) 200-25 MCG/ACT AEPB Inhale 1 puff into the lungs daily. 10/16/24   Ricky Fines, MD  furosemide  (LASIX ) 20 MG tablet Take 1 tablet (20 mg total) by mouth daily. 11/12/24 11/12/25  Evonnie Lenis, MD  ipratropium-albuterol  (DUONEB) 0.5-2.5 (3) MG/3ML SOLN Take 3 mLs by nebulization every 4 (four) hours as needed (wheezing and SOB). Patient taking differently: Take 3 mLs by nebulization every 4 (four) hours as needed (wheezing and shortness of breath). 10/16/24   Ricky Fines, MD  ipratropium-albuterol  (DUONEB) 0.5-2.5 (3) MG/3ML SOLN Take 3 mLs by nebulization 3 (three) times daily. Given at 0900, 1200 and 2100    [provider]  levothyroxine  (SYNTHROID ) 50 MCG tablet Take 50 mcg by mouth daily before breakfast.  [provider]  metFORMIN  (GLUCOPHAGE ) 1000 MG tablet Take 1,000 mg by mouth 2 (two) times daily. Given at 0800 and 1800 10/01/24   [provider]  midodrine  (PROAMATINE ) 5 MG tablet Take 5 mg by mouth 3 (three) times daily with meals. Given at 0800, 1200 and 1800 06/05/24   [provider]  Multiple Vitamins-Minerals (CENTRUM SILVER PO) Take 1 tablet by mouth every morning.    [provider]  nitrofurantoin  (MACRODANTIN ) 50 MG capsule Take 1 capsule (50 mg total) by mouth at bedtime. 11/27/24   McKenzie, Belvie CROME, MD  omeprazole   (PRILOSEC) 20 MG capsule Take 1 capsule (20 mg total) by mouth 2 (two) times daily before a meal. 10/16/24   Ricky Fines, MD  oseltamivir  (TAMIFLU ) 75 MG capsule Take 1 capsule (75 mg total) by mouth 2 (two) times daily. 12/25/24   Rashid, Farhan, MD  polyethylene glycol (MIRALAX  / GLYCOLAX ) 17 g packet Take 17 g by mouth daily as needed for mild constipation. 05/01/24   Vicci Afton CROME, MD  potassium chloride  (KLOR-CON ) 10 MEQ tablet Take 10 mEq by mouth every morning. 05/15/24   [provider]  predniSONE  (DELTASONE ) 20 MG tablet Take 2 tablets (40 mg total) by mouth daily with breakfast. 12/26/24   Rashid, Farhan, MD  pregabalin  (LYRICA ) 150 MG capsule Take 1 capsule (150 mg total) by mouth 2 (two) times daily. Given at 0900 and 2100 12/25/24   Rashid, Farhan, MD  rizatriptan  (MAXALT ) 10 MG tablet Take 10 mg by mouth daily as needed for migraine. 09/12/24   [provider]  tiZANidine  (ZANAFLEX ) 2 MG tablet Take 2 mg by mouth 2 (two) times daily as needed for muscle spasms. 06/21/24   [provider]  traZODone  (DESYREL ) 50 MG tablet Take 1 tablet (50 mg total) by mouth at bedtime as needed for sleep. 03/19/24   Pearlean Manus, MD  TRINTELLIX  10 MG TABS tablet Take 10 mg by mouth every morning. 03/09/24   [provider]  umeclidinium bromide  (INCRUSE ELLIPTA ) 62.5 MCG/ACT AEPB Inhale 1 puff into the lungs daily. 10/16/24   Ricky Fines, MD  venlafaxine  (EFFEXOR -XR) 150 MG 24 hr capsule Take 150 mg by mouth every morning.    [provider]    Allergies: Betadine [povidone iodine], Povidone-iodine, and Penicillins    Review of Systems  Constitutional:  Negative for chills and fever.  Respiratory:  Negative for shortness of breath.   Cardiovascular:  Negative for chest pain and leg swelling.  Gastrointestinal:  Positive for nausea. Negative for abdominal pain and vomiting.  Neurological:  Positive for headaches.    Updated Vital Signs BP (!)  140/81   Pulse 74   Temp 97.7 F (36.5 C) (Oral)   Resp 13   SpO2 97%   Physical Exam Constitutional:      Appearance: Normal appearance.  HENT:     Head: Normocephalic and atraumatic.     Nose: Nose normal.     Mouth/Throat:     Mouth: Mucous membranes are moist.     Pharynx: Oropharynx is clear.  Cardiovascular:     Rate and Rhythm: Normal rate.  Pulmonary:     Effort: Pulmonary effort is normal.     Breath sounds: Normal breath sounds.  Musculoskeletal:        General: Normal range of motion.  Skin:    General: Skin is warm and dry.  Neurological:     Mental Status: She is alert and oriented to person,  place, and time.  Psychiatric:        Mood and Affect: Mood normal.        Behavior: Behavior normal.     (all labs ordered are listed, but only abnormal results are displayed) Labs Reviewed  RESP PANEL BY RT-PCR (RSV, FLU A&B, COVID)  RVPGX2 - Abnormal; Notable for the following components:      Result Value   Influenza A by PCR POSITIVE (*)    All other components within normal limits  CBC - Abnormal; Notable for the following components:   WBC 18.0 (*)    Hemoglobin 11.6 (*)    HCT 35.6 (*)    RDW 16.3 (*)    Platelets 458 (*)    nRBC 0.6 (*)    All other components within normal limits  COMPREHENSIVE METABOLIC PANEL WITH GFR - Abnormal; Notable for the following components:   Sodium 134 (*)    Chloride 92 (*)    Glucose, Bld 517 (*)    ALT 47 (*)    Anion gap 20 (*)    All other components within normal limits  BLOOD GAS, VENOUS - Abnormal; Notable for the following components:   pH, Ven 7.52 (*)    pCO2, Ven 38 (*)    pO2, Ven 46 (*)    Bicarbonate 31.1 (*)    Acid-Base Excess 7.5 (*)    All other components within normal limits  CBG MONITORING, ED - Abnormal; Notable for the following components:   Glucose-Capillary 509 (*)    All other components within normal limits  I-STAT CHEM 8, ED - Abnormal; Notable for the following components:   Sodium  133 (*)    Chloride 93 (*)    Glucose, Bld 514 (*)    Calcium , Ion 1.10 (*)    All other components within normal limits  CBG MONITORING, ED - Abnormal; Notable for the following components:   Glucose-Capillary 205 (*)    All other components within normal limits  URINALYSIS, ROUTINE W REFLEX MICROSCOPIC  CBG MONITORING, ED    EKG: None  Radiology: DG Chest Portable 1 View Result Date: 12/27/2024 EXAM: 1 VIEW(S) XRAY OF THE CHEST 12/27/2024 10:00:04 PM COMPARISON: Comparison with 12/22/2024. CLINICAL HISTORY: hyperglycemia hyperglycemia FINDINGS: LUNGS AND PLEURA: Shallow inspiration. Pulmonary vascularity is normal. Peribronchial thickening with streaky perihilar opacities likely representing chronic bronchitis. No focal consolidation. No pleural effusion. No pneumothorax. HEART AND MEDIASTINUM: Heart size is normal. Mediastinal contours appear intact. Calcification of the aorta. BONES AND SOFT TISSUES: No acute osseous abnormality. IMPRESSION: 1. No acute findings. Electronically signed by: Elsie Gravely MD 12/27/2024 10:59 PM EST RP Workstation: [id number]     Medications Ordered in the ED  sodium chloride  0.9 % bolus 1,000 mL (0 mLs Intravenous Stopped 12/27/24 2336)  insulin  aspart (novoLOG ) injection 10 Units (10 Units Intravenous Given 12/27/24 2234)                                 Medical Decision Making Patient is an 84 year old female who presents to the ED from facility for uncontrolled hyperglycemia.  Please see detailed HPI above.  On exam patient is alert and in no acute distress.  Physical exam as noted above.  History of vascular dementia at baseline.  No acute worsening mental status noted on exam.  Initial glucose noted to be 517.  Patient was given a liter of fluids and 10 units of regular insulin .  Glucose did improved to 205.  VBG was obtained at shows mild respiratory alkalosis but no acute signs of DKA or HSS.  Leukocytosis of 18 noted but this is most likely reactive  as patient was discharged from the hospital 3 days prior on steroids.  This is also the most likely cause of patient's hyperglycemia today.  She was admitted for influenza infection and COPD exacerbation.  She appears to be improving from this and vital signs are stable.  Chest x-ray reviewed and negative for acute abnormality today.  EKG shows sinus rhythm.  As patient's glucose has improved and she is back to her baseline, we do feel she is stable for discharge back to facility today.  She is to continue metformin  as prescribed.  Regular insulin  has been prescribed.  Advised to follow-up with PCP within the next week for recheck and to discuss potential sliding scale as patient blood sugar has been noted to be previously uncontrolled.  Written instructions provided for facility.  Return precautions provided for worsening symptoms.  Amount and/or Complexity of Data Reviewed Labs: ordered. Radiology: ordered.  Risk Prescription drug management.      Final diagnoses:  Hyperglycemia    ED Discharge Orders          Ordered    insulin  aspart (NOVOLOG ) 100 UNIT/ML injection  3 times daily before meals        12/27/24 2335               Neysa Thersia RAMAN, [place] 12/28/24 0004  "

## 2024-12-27 NOTE — Discharge Instructions (Signed)
 Continue to monitor blood sugars.  This is most likely elevated from recent steroids.  Continue metformin  as prescribed and may begin giving NovoLog  insulin  as prescribed.  Please follow-up with primary care doctor in the next week for recheck.  Patient may need a sliding scale for insulin .  Return to ED if any symptoms worsen including uncontrollable blood sugars, chest pain, difficulty breathing.

## 2024-12-27 NOTE — Progress Notes (Signed)
"  ° °  NURSING HOME [id number]:  [facility] ROOM NUMBER:    CODE STATUS:  DNR  PCP: [name], MD   This is a nursing facility follow up visit for Nursing Facility readmission within 30 days.  Interim medical record and care since last SNF visit was updated with review of diagnostic studies and change in clinical status since last visit were documented.  HPI: She was readmitted 12/28-12/31/2025 with acute metabolic encephalopathy in context of Influenza A  complicated by hypoxic respiratory failure. This presentation was in the context of medical diagnoses of hypothyroidism; essential hypertension; diabetes with peripheral neuropathy; GERD; and vascular dementia. Aggressive pulmonary toilet with IV SoluMedrol, bronchodilators & mucolytics was received with clinical improvement.  Tamiflu  was continued to be completed postdischarge.  She was able to be weaned off oxygen completely and was transitioned to oral prednisone  40 mg daily for 3 days at discharge. Recorded glucose was 63; A1c had been 6.5% on 11/09/2024.  Protein/caloric malnutrition was suggested by an albumin of 3.4 and total protein 6.3.  Normochromic, normocytic anemia was present with H/H of 11.6/37.  White count was normal at 7100 as well as differential. . This encounter was created in error - please disregard. "

## 2024-12-27 NOTE — ED Notes (Signed)
 Family at bedside.

## 2024-12-27 NOTE — ED Triage Notes (Signed)
 Pt bib EMS from [facility] for hyperglycemia. Staff reports CBG of 572 at 1700 for which they gave 8u Novolog  insulin . Staff then rechecked CBG at 1800 and CBG was 565 so they gave another 4u of Novolog  insulin . At 1900, staff reported CBG of 590 and decided to send pt to ER for further workup. EMS reports CBG of 526 and c/o nausea and headache.

## 2024-12-27 NOTE — ED Notes (Signed)
 Lab at bedside

## 2024-12-27 NOTE — Progress Notes (Signed)
 " Location:  [facility] Room Number: 126 P Place of Service:  SNF (31)   CODE STATUS: DNR   Allergies[1]  Chief Complaint  Patient presents with   Hospitalization Follow-up    HPI:  She is a 84 year old woman who has been hospitalized from 12-22-24 through 12-25-24. Her past medical history includes: type 2 diabetes mellitus; vascular dementia; history of cva's; malnutrition.  She was hospitalized for influenza A with acute hypoxic respiratory failure.  She was treated with IV steroids; bronchodilators and mucolytic. She will need 3 more days of prednisone  post hospitalization. She will continue to be followed for her chronic illnesses including: Hypotension, unspecified hypotension type: GERD without esophagitis: COPD with acute exacerbation:     Past Medical History:  Diagnosis Date   Anemia    Anxiety    Asthma    ASYMPTOMATIC POSTMENOPAUSAL STATUS 02/09/2009   Cataract    Cerebral infarction (HCC)    Constipation 03/19/2013   Depression    DIABETES MELLITUS, TYPE II 09/14/2007   HYPERCHOLESTEROLEMIA 02/09/2009   HYPERTENSION 02/09/2009   HYPOTHYROIDISM 09/14/2007   Metabolic encephalopathy    MIGRAINE HEADACHE 09/14/2007   Neuropathy    OSTEOPOROSIS 02/09/2009   PANCREATITIS 09/14/2007   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   PONV (postoperative nausea and vomiting)    Protein calorie malnutrition    Rectocele 03/19/2013   Shortness of breath    SUPERFICIAL PHLEBITIS 09/14/2007   Varicose veins     Past Surgical History:  Procedure Laterality Date   ABDOMINAL HYSTERECTOMY     ANTERIOR AND POSTERIOR REPAIR N/A 04/02/2013   Procedure: ANTERIOR (CYSTOCELE) AND POSTERIOR REPAIR (RECTOCELE);  Surgeon: Norleen LULLA Server, MD;  Location: [facility];  Service: Gynecology;  Laterality: N/A;   APPENDECTOMY     BRAIN SURGERY     CATARACT EXTRACTION W/PHACO  12/05/2011   Procedure: CATARACT EXTRACTION PHACO AND INTRAOCULAR LENS PLACEMENT (IOC);  Surgeon: Cherene Mania;  Location: [facility];  Service: Ophthalmology;  Laterality: Left;  CDE:9.65   CHOLECYSTECTOMY     CRANIOTOMY N/A 06/04/2020   Procedure: Endoscopic Transphenoidal Resection of Recurrent PituitaryTumor;  Surgeon: Colon Shove, MD;  Location: [facility] OR;  Service: Neurosurgery;  Laterality: N/A;   CYSTOSCOPY     ENDOVENOUS ABLATION SAPHENOUS VEIN W/ LASER  11-03-2011   right greater saphenous vein   left leg done 10-2011   EYE SURGERY  98   right cataract extraction 98   LAPAROSCOPIC NISSEN FUNDOPLICATION     NM ESOPHAGEAL REFLUX  08-11-11   PITUITARY EXCISION  10/1997   POSTERIOR REPAIR     TRANSNASAL APPROACH N/A 06/04/2020   Procedure: TRANSNASAL APPROACH;  Surgeon: Mable Lenis, MD;  Location: [facility] OR;  Service: ENT;  Laterality: N/A;   TRANSPHENOIDAL / TRANSNASAL HYPOPHYSECTOMY / RESECTION PITUITARY TUMOR  08-11-11    Social History   Socioeconomic History   Marital status: Married    Spouse name: Not on file   Number of children: Not on file   Years of education: Not on file   Highest education level: Not on file  Occupational History   Occupation: Retired    Associate Professor: RETIRED  Tobacco Use   Smoking status: Never   Smokeless tobacco: Never  Vaping Use   Vaping status: Never Used  Substance and Sexual Activity   Alcohol use: No    Alcohol/week: 0.0 standard drinks of alcohol   Drug use: No   Sexual activity: Not Currently    Birth control/protection: Surgical  Other Topics Concern   Not on file  Social History Narrative   Not on file   Social Drivers of Health   Tobacco Use: Low Risk (12/27/2024)   Patient History    Smoking Tobacco Use: Never    Smokeless Tobacco Use: Never    Passive Exposure: Not on file  Financial Resource Strain: Not on file  Food Insecurity: Patient Unable To Answer (12/22/2024)   Epic    Worried About Programme Researcher, Broadcasting/film/video in the Last Year: Patient unable to answer    Ran Out of Food in the Last Year: Patient unable to answer   Transportation Needs: Patient Unable To Answer (12/22/2024)   Epic    Lack of Transportation (Medical): Patient unable to answer    Lack of Transportation (Non-Medical): Patient unable to answer  Physical Activity: Not on file  Stress: Not on file  Social Connections: Patient Unable To Answer (12/22/2024)   Social Connection and Isolation Panel    Frequency of Communication with Friends and Family: Patient unable to answer    Frequency of Social Gatherings with Friends and Family: Patient unable to answer    Attends Religious Services: Patient unable to answer    Active Member of Clubs or Organizations: Patient unable to answer    Attends Banker Meetings: Patient unable to answer    Marital Status: Patient unable to answer  Recent Concern: Social Connections - Socially Isolated (11/08/2024)   Social Connection and Isolation Panel    Frequency of Communication with Friends and Family: More than three times a week    Frequency of Social Gatherings with Friends and Family: Twice a week    Attends Religious Services: Never    Database Administrator or Organizations: No    Attends Banker Meetings: Never    Marital Status: Widowed  Intimate Partner Violence: Patient Unable To Answer (12/22/2024)   Epic    Fear of Current or Ex-Partner: Patient unable to answer    Emotionally Abused: Patient unable to answer    Physically Abused: Patient unable to answer    Sexually Abused: Patient unable to answer  Depression (PHQ2-9): Low Risk (10/28/2024)   Depression (PHQ2-9)    PHQ-2 Score: 0  Alcohol Screen: Not on file  Housing: Unknown (12/22/2024)   Epic    Unable to Pay for Housing in the Last Year: Patient unable to answer    Number of Times Moved in the Last Year: 0    Homeless in the Last Year: Patient unable to answer  Utilities: Patient Unable To Answer (12/22/2024)   Epic    Threatened with loss of utilities: Patient unable to answer  Health Literacy: Not  on file   Family History  Problem Relation Age of Onset   Heart disease Mother    Asthma Mother    COPD Father    Heart disease Father    Stroke Father    Asthma Daughter    Migraines Daughter    Neuropathy Son    Cancer Neg Hx    Anesthesia problems Neg Hx    Hypotension Neg Hx    Malignant hyperthermia Neg Hx    Pseudochol deficiency Neg Hx       VITAL SIGNS BP 128/84   Pulse 98   Temp (!) 97.2 F (36.2 C)   Resp 20   Ht 5' 6 (1.676 m)   Wt 138 lb 8 oz (62.8 kg)   SpO2 95%   BMI 22.35  kg/m   Outpatient Encounter Medications as of 12/27/2024  Medication Sig Note   acetaminophen  (TYLENOL ) 325 MG tablet Take 2 tablets (650 mg total) by mouth every 6 (six) hours as needed for mild pain (pain score 1-3) (or Fever >/= 101).    albuterol  (VENTOLIN  HFA) 108 (90 Base) MCG/ACT inhaler Inhale 2 puffs into the lungs every 4 (four) hours as needed for wheezing or shortness of breath.    aspirin  EC 81 MG tablet Take 81 mg by mouth every morning. Swallow whole.    atorvastatin  (LIPITOR) 80 MG tablet Take 80 mg by mouth every morning.    busPIRone  (BUSPAR ) 5 MG tablet Take 5 mg by mouth 3 (three) times daily as needed (anxiety).    cyanocobalamin  (VITAMIN B12) 1000 MCG/ML injection Inject 1,000 mcg into the muscle every 30 (thirty) days. Given on the 23rd of each month    cycloSPORINE  (RESTASIS ) 0.05 % ophthalmic emulsion Place 1 drop into both eyes 2 (two) times daily. Given at 0900 and 2100    Estradiol  (IMVEXXY  MAINTENANCE PACK) 10 MCG INST Place 1 suppository vaginally 2 (two) times a week. Mondays and Thursdays    fluticasone  furoate-vilanterol (BREO ELLIPTA ) 200-25 MCG/ACT AEPB Inhale 1 puff into the lungs daily.    furosemide  (LASIX ) 20 MG tablet Take 1 tablet (20 mg total) by mouth daily.    ipratropium-albuterol  (DUONEB) 0.5-2.5 (3) MG/3ML SOLN Take 3 mLs by nebulization every 4 (four) hours as needed (wheezing and SOB). (Patient taking differently: Take 3 mLs by nebulization  every 4 (four) hours as needed (wheezing and shortness of breath).) 12/22/2024: Two entries on MAR for same medication.   ipratropium-albuterol  (DUONEB) 0.5-2.5 (3) MG/3ML SOLN Take 3 mLs by nebulization 3 (three) times daily. Given at 0900, 1200 and 2100 12/22/2024: Two entries on MAR for same medication.   levothyroxine  (SYNTHROID ) 50 MCG tablet Take 50 mcg by mouth daily before breakfast.    metFORMIN  (GLUCOPHAGE ) 1000 MG tablet Take 1,000 mg by mouth 2 (two) times daily. Given at 0800 and 1800    midodrine  (PROAMATINE ) 5 MG tablet Take 5 mg by mouth 3 (three) times daily with meals. Given at 0800, 1200 and 1800    Multiple Vitamins-Minerals (CENTRUM SILVER PO) Take 1 tablet by mouth every morning.    nitrofurantoin  (MACRODANTIN ) 50 MG capsule Take 1 capsule (50 mg total) by mouth at bedtime. 12/22/2024: Per MAR, patient is on this long term.   omeprazole  (PRILOSEC) 20 MG capsule Take 1 capsule (20 mg total) by mouth 2 (two) times daily before a meal.    oseltamivir  (TAMIFLU ) 75 MG capsule Take 1 capsule (75 mg total) by mouth 2 (two) times daily.    polyethylene glycol (MIRALAX  / GLYCOLAX ) 17 g packet Take 17 g by mouth daily as needed for mild constipation.    potassium chloride  (KLOR-CON ) 10 MEQ tablet Take 10 mEq by mouth every morning.    predniSONE  (DELTASONE ) 20 MG tablet Take 2 tablets (40 mg total) by mouth daily with breakfast.    pregabalin  (LYRICA ) 150 MG capsule Take 1 capsule (150 mg total) by mouth 2 (two) times daily. Given at 0900 and 2100    rizatriptan  (MAXALT ) 10 MG tablet Take 10 mg by mouth daily as needed for migraine.    tiZANidine  (ZANAFLEX ) 2 MG tablet Take 2 mg by mouth 2 (two) times daily as needed for muscle spasms.    traZODone  (DESYREL ) 50 MG tablet Take 1 tablet (50 mg total) by mouth at bedtime as  needed for sleep.    TRINTELLIX  10 MG TABS tablet Take 10 mg by mouth every morning.    umeclidinium bromide  (INCRUSE ELLIPTA ) 62.5 MCG/ACT AEPB Inhale 1 puff into the  lungs daily.    venlafaxine  (EFFEXOR -XR) 150 MG 24 hr capsule Take 150 mg by mouth every morning.    No facility-administered encounter medications on file as of 12/27/2024.     SIGNIFICANT DIAGNOSTIC EXAMS  LABS  12-10-24: wbc 13.9; hgb 11.2; hct 34.2; mcv 89.1 plt 245 glucose 176; bun 15; creat 0.59; k+ 4.7; na++ 135; ca 8.5 gfr >60; protein 6.4 albumin 3.9; blood culture: no growth; UA; neg 12-12-24: mag 1.5 12-15-24; wbc 10.1; hgb 10.7; hct 336.; mcv 90.3 pt 227; glucose 174; bun 6; creat 0.52; k+ 4.4; na++ 137; ca 8.6; gfr >60 mag 1.9 12-22-24: wbc 7.1; hgb 11.6; hct 37.0; mcv 90.7 plt 368; glucose 63; bun 19; creat 0.68; k+ 4.0; na++ 135; ca 8.2 gfr >60 protein 6.3 albumin 3.4    Review of Systems  Constitutional:  Negative for malaise/fatigue.  Respiratory:  Negative for cough and shortness of breath.   Cardiovascular:  Negative for chest pain, palpitations and leg swelling.  Gastrointestinal:  Negative for abdominal pain, constipation and heartburn.  Musculoskeletal:  Negative for back pain, joint pain and myalgias.  Skin: Negative.   Neurological:  Positive for headaches. Negative for dizziness.  Psychiatric/Behavioral:  The patient is not nervous/anxious.     Physical Exam Constitutional:      General: She is not in acute distress.    Appearance: She is well-developed. She is not diaphoretic.  Neck:     Thyroid : No thyromegaly.  Cardiovascular:     Rate and Rhythm: Normal rate and regular rhythm.     Heart sounds: Normal heart sounds.  Pulmonary:     Effort: Pulmonary effort is normal. No respiratory distress.     Breath sounds: Normal breath sounds.  Abdominal:     General: Bowel sounds are normal. There is no distension.     Palpations: Abdomen is soft.     Tenderness: There is no abdominal tenderness.  Musculoskeletal:        General: Normal range of motion.     Cervical back: Neck supple.     Right lower leg: No edema.     Left lower leg: No edema.   Lymphadenopathy:     Cervical: No cervical adenopathy.  Skin:    General: Skin is warm and dry.  Neurological:     Mental Status: She is alert. Mental status is at baseline.  Psychiatric:        Mood and Affect: Mood normal.         ASSESSMENT/ PLAN:  TODAY  Influenza A/acute respiratory failure with hypoxia:  will complete tamiflu  75 mg twice daily for total of 3 days and will continue prednisone  40 mg daily through 12-28-24.   2. Hypotension, unspecified hypotension type: will continue midodrine  5 mg three times daily   3. GERD without esophagitis: will continue prilosec 20 mg twice daily   4. COPD with acute exacerbation: will continue incruse ellipta  62.5 mcg 1 puff daily ; duoneb three times daily and every 4 hours as needed breo 200/25 mcg one puff daily   5. Other migraine without status migrainous not intractable has maxalt  10 mg daily as needed   6. Type 2 diabetes mellitus with peripheral neuropathy: will continue metformin  1 gm twice daily   7. Hyperlipidemia associated with type 2 diabetes  mellitus: will continue lipitor 80 mg daily   8. Diabetic peripheral neuropathy: will continue lyrica  150 mg twice daily   9. Severe vascular dementia with other behavioral disturbance weight is 138 pounds  10. Recurrent UTI: is on long term macrobid  50 mg daily and estradiol  twice weekly   11. Anxiety and depression: will continue trintellix  10 mg daily; venlafaxine  er 150 mg daily trazodone  50 mg nightly prn and has buspar  5 mg three times daily as needed    12. Acquired hypothyroidism: will continue synthroid  50 mcg  daily   13. Hypokalemia will continue k+ 10 meq daily   14. Vitamin b 12 deficiency: is on monthly injections  15. Chronic constipation: will continue miralax  daily as needed  16. Bilateral lower extremity edema: will continue lasix  20 mg daily   17. History of CVA is on asa and statin     Barnie Seip NP [facility]  call [phone]        [1]  Allergies Allergen Reactions   Betadine [Povidone Iodine] Hives   Povidone-Iodine Hives   Penicillins Rash and Dermatitis   "

## 2024-12-30 DIAGNOSIS — J101 Influenza due to other identified influenza virus with other respiratory manifestations: Secondary | ICD-10-CM | POA: Diagnosis not present

## 2024-12-30 DIAGNOSIS — J9601 Acute respiratory failure with hypoxia: Secondary | ICD-10-CM

## 2024-12-30 DIAGNOSIS — I959 Hypotension, unspecified: Secondary | ICD-10-CM | POA: Diagnosis not present

## 2024-12-30 DIAGNOSIS — E1165 Type 2 diabetes mellitus with hyperglycemia: Secondary | ICD-10-CM | POA: Diagnosis not present

## 2024-12-30 DIAGNOSIS — F01C18 Vascular dementia, severe, with other behavioral disturbance: Secondary | ICD-10-CM

## 2024-12-30 DIAGNOSIS — Z794 Long term (current) use of insulin: Secondary | ICD-10-CM

## 2024-12-30 NOTE — Assessment & Plan Note (Addendum)
 S/P full course of Tamiflu .  She remains afebrile.  She describes intermittent nonproductive cough in context of COPD.  Continue pulmonary toilet.

## 2024-12-30 NOTE — Assessment & Plan Note (Addendum)
 She is on midodrine  5 mg 3 times daily.  She is not on any antihypertensives.  She is on multiple psychotropic drugs.  Pharmacy will be asked to review to see if any of these could be contributing to the hypotension.  Midodrine  increased to 10 mg 3 times daily.

## 2024-12-30 NOTE — Progress Notes (Signed)
 "   NURSING HOME LOCATION:  [facility] ROOM NUMBER: [id number]  CODE STATUS:    PCP: [name], MD   This is a nursing facility follow up visit for Nursing Facility readmission within 30 days.  Interim medical record and care since last SNF visit was updated with review of diagnostic studies and change in clinical status since last visit were documented.  HPI: She was readmitted 12/28 - 12/25/2024 with acute metabolic encephalopathy in the context of acute influenza A infection complicated by acute hypoxic respiratory failure.  She was diagnosed with influenza A at the SNF and Tamiflu  initiated ;but the patient had progressive acute metabolic encephalopathy in the context of vascular dementia.  CT of the head revealed no new or acute intracranial changes. Influenza infection was associated with COPD exacerbation with acute hypoxic respiratory failure.  White count was normal; normochromic, normocytic anemia was present with H/H of 11.6/37.  Aggressive pulmonary toilet was initiated with IV Solu-Medrol , bronchodilators, and mucolytic's with improvement of respiratory status and mental status.  She was able to be weaned off oxygen to room air.  She was transitioned to oral prednisone  40 mg daily x 3 days at discharge. Diabetic control was good as evidenced by an A1c of 6.5% recently.  While hospitalized, recorded glucose was 63. Clinically she stabilized & was discharged back to SNF to continue rehab. Unfortunately patient developed profound hyperglycemia at the facility with peak glucose of 590 despite NovoLog  injections.  She was sent to the ED 12/27/2024; white blood count was 18,000 in the context of the high-dose parenteral and oral steroids.  She received NovoLog  10 units intravenously as well as a liter of fluids with improvement in the glucose with a value of 205.  There were no acute signs of DKA or HSS.  Chest x-ray revealed no acute abnormalities.  She was felt to be clinically  stable to return to the SNF.  Review of systems: Dementia invalidated responses.  She has no comprehension of why she had to go back to the ED & was rehospitalized.  Her initial complaint was that she was tired.  She then validated that whole head headache from the back to the front, whole head. (Note: no acute change on cns imaging).  She did validate some cough.   Staff reports that intermittently she gets agitated; 1 such episode was associated with O2 desaturation to 88%.  She was also noted to exhibit slight wheezing.  She received a neb treatment with improvement in O2 sats to 99%.    Since return from the ED glucoses have ranged from the low of 103 up to the high of 384.  She receives NovoLog  with meals if glucose is greater than 150.  Constitutional: No fever, significant weight change  Eyes: No redness, discharge, pain, vision change ENT/mouth: No nasal congestion,  purulent discharge, earache, change in hearing, sore throat  Cardiovascular: No chest pain, palpitations, paroxysmal nocturnal dyspnea, claudication, edema  Respiratory: No  sputum production, hemoptysis, DOE, significant snoring, apnea   Gastrointestinal: No heartburn, dysphagia, abdominal pain, nausea /vomiting, rectal bleeding, melena, change in bowels Genitourinary: No dysuria, hematuria, pyuria, incontinence, nocturia Musculoskeletal: No joint stiffness, joint swelling, weakness, pain Dermatologic: No rash, pruritus, change in appearance of skin Neurologic: No dizziness, syncope, seizures, numbness, tingling Endocrine: No change in hair/skin/nails, excessive thirst, excessive hunger, excessive urination  Hematologic/lymphatic: No significant bruising, lymphadenopathy, abnormal bleeding Allergy/immunology: No itchy/watery eyes, significant sneezing, urticaria, angioedema  Physical exam:  Pertinent or  positive findings: She appears her age and chronically ill.  She has bilateral ptosis.  Eyebrows are essentially  absent.  Low-grade rales are noted at the bases.  Pedal pulses are decreased.  She has interosseous wasting and limb atrophy.  She has diffuse isolated osteoarthritic changes of the hands.  General appearance:  no acute distress, increased work of breathing is present.   Lymphatic: No lymphadenopathy about the head, neck, axilla. Eyes: No conjunctival inflammation or lid edema is present. There is no scleral icterus. Ears:  External ear exam shows no significant lesions or deformities.   Nose:  External nasal examination shows no deformity or inflammation. Nasal mucosa are pink and moist without lesions, exudates Oral exam:  Lips and gums are healthy appearing. There is no oropharyngeal erythema or exudate. Neck:  No thyromegaly, masses, tenderness noted.    Heart:  Normal rate and regular rhythm. S1 and S2 normal without gallop, murmur, click, rub .  Lungs:  without wheezes, rhonchi,  rubs. Abdomen: Bowel sounds are normal. Abdomen is soft and nontender with no organomegaly, hernias, masses. GU: Deferred  Extremities:  No cyanosis, clubbing, edema  Neurologic exam :Balance, Rhomberg, finger to nose testing could not be completed due to clinical state Skin: Warm & dry w/o tenting. No significant lesions or rash.  See summary under each active problem in the Problem List with associated updated therapeutic plan  Acute hypoxic respiratory failure (HCC) Neb treatments continued for COPD.  O2 sats  monitored.  Hypotension She is on midodrine  5 mg 3 times daily.  She is not on any antihypertensives.  She is on multiple psychotropic drugs.  Pharmacy will be asked to review to see if any of these could be contributing to the hypotension.  Midodrine  increased to 10 mg 3 times daily.  Influenza A S/P full course of Tamiflu .  She remains afebrile.  She describes intermittent nonproductive cough in context of COPD.  Continue pulmonary toilet.  Uncontrolled type 2 diabetes mellitus with  hyperglycemia, with long-term current use of insulin  (HCC) Most recent A1c 11/09/2024 had indicated good control with an A1c of 6.5%, just barely in diabetic range.  During hospitalization 12/28 - 12/25/2024 glucoses ranged from a low of 51 up to a high of 307.  In retrospect after the IV Solu-Medrol  there was an uptick in the recorded random glucoses with a peak value of 307 prior to discharge.  Despite this she was discharged on metformin  alone.  Additionally at discharge she was transitioned to high-dose oral steroids. Marked hyperglycemia with peak glucose of 592 @ [facility] in the context of IV Solu-Medrol  and subsequent high-dose oral steroids.   Now on NovoLog  before each meal for glucoses greater than 150 with good clinical response.  Continue to monitor.  Vascular dementia [facility]) She has no comprehension of still why she had to go back to the ED and was rehospitalized.  She exhibits intermittent agitation.  She is on psychotropic polypharmacy.  Neurology follow-up indicated.  The relatively soft blood pressures may be contributing to her neurocognitive issues.   "

## 2024-12-30 NOTE — Assessment & Plan Note (Addendum)
 Neb treatments continued for COPD.  O2 sats  monitored.

## 2024-12-30 NOTE — Assessment & Plan Note (Addendum)
 Most recent A1c 11/09/2024 had indicated good control with an A1c of 6.5%, just barely in diabetic range.  During hospitalization 12/28 - 12/25/2024 glucoses ranged from a low of 51 up to a high of 307.  In retrospect after the IV Solu-Medrol  there was an uptick in the recorded random glucoses with a peak value of 307 prior to discharge.  Despite this she was discharged on metformin  alone.  Additionally at discharge she was transitioned to high-dose oral steroids. Marked hyperglycemia with peak glucose of 592 @ [facility] in the context of IV Solu-Medrol  and subsequent high-dose oral steroids.   Now on NovoLog  before each meal for glucoses greater than 150 with good clinical response.  Continue to monitor.

## 2024-12-30 NOTE — Patient Instructions (Signed)
 See assessment and plan under each diagnosis in the problem list and acutely for this visit

## 2024-12-31 ENCOUNTER — Encounter: Payer: Self-pay | Admitting: Internal Medicine

## 2024-12-31 NOTE — Assessment & Plan Note (Signed)
 She has no comprehension of still why she had to go back to the ED and was rehospitalized.  She exhibits intermittent agitation.  She is on psychotropic polypharmacy.  Neurology follow-up indicated.  The relatively soft blood pressures may be contributing to her neurocognitive issues.

## 2025-01-01 ENCOUNTER — Encounter: Payer: Self-pay | Admitting: Internal Medicine

## 2025-01-01 ENCOUNTER — Non-Acute Institutional Stay (SKILLED_NURSING_FACILITY): Payer: Self-pay | Admitting: Internal Medicine

## 2025-01-01 DIAGNOSIS — E43 Unspecified severe protein-calorie malnutrition: Secondary | ICD-10-CM

## 2025-01-01 DIAGNOSIS — Z794 Long term (current) use of insulin: Secondary | ICD-10-CM

## 2025-01-01 DIAGNOSIS — E1165 Type 2 diabetes mellitus with hyperglycemia: Secondary | ICD-10-CM | POA: Diagnosis not present

## 2025-01-01 DIAGNOSIS — I959 Hypotension, unspecified: Secondary | ICD-10-CM | POA: Diagnosis not present

## 2025-01-01 DIAGNOSIS — G9341 Metabolic encephalopathy: Secondary | ICD-10-CM | POA: Diagnosis not present

## 2025-01-01 DIAGNOSIS — F01C18 Vascular dementia, severe, with other behavioral disturbance: Secondary | ICD-10-CM | POA: Diagnosis not present

## 2025-01-01 NOTE — Assessment & Plan Note (Addendum)
 Blood pressure has improved but remains soft at 100/54. [name] White, PharmD reviewed her med list.  He stated that the use of Trintellix  and venlafaxine  together is not recommended due to the risk of serotonin syndrome.  Her daughter states that there was dramatic decompensation when she was taken off her venlafaxine  in the past.  Therefore the Trintellix  will be held for 5 days and her mental status reassessed. Additionally for antibiotic stewardship he recommended reevaluating the use of Macrodantin .  The daughter indicates that this has been prescribed prophylactically because of recurrent UTIs.  In the absence of radiographic interstitial pulmonary changes with this drug; it will be continued for present.

## 2025-01-01 NOTE — Assessment & Plan Note (Signed)
 Nutritional intake has plummeted.  Glucoses are monitored AC and insulin  administered based on values.  It is critical to avoid hypoglycemia as this would mimic a TIA.

## 2025-01-01 NOTE — Progress Notes (Signed)
 "   NURSING HOME LOCATION:  [facility] ROOM NUMBER:  126 P  CODE STATUS:  DNR  PCP: [name], MD   This is a nursing facility follow up visit for specific acute issue of adult failure to thrive..  Interim medical record and care since last SNF visit was updated with review of diagnostic studies and change in clinical status since last visit were documented.  HPI: Her family is concerned as she has become less responsive and is not engaging in rehab.  Her nutritional intake has plummeted.  Her daughter has been a engineer, civil (consulting) for 30 years and questions whether she has had another stroke.  Review of systems could not be completed as patient was nonverbal.  Physical exam:  Pertinent or positive findings: She was initially asleep but did open her eyes.  Eyebrows are essentially absent. She has bilateral ptosis.  Although she opened her eyes it was not widely. When she did not open her eyes she seemed unfocused.  The left nasolabial fold is decreased.  Intraoral exam was limited as she resisted the exam.  She seemed to briefly smile and laugh quietly. Pedal pulses are not palpable.  She has marked limb atrophy and interosseous wasting.  She did not move the upper extremities but not the lower extremities during my exam.  Her daughter states she has been moving her feet.  General appearance: no acute distress, increased work of breathing is present.   Lymphatic: No lymphadenopathy about the head, neck, axilla. Eyes: No conjunctival inflammation or lid edema is present. There is no scleral icterus. Ears:  External ear exam shows no significant lesions or deformities.   Nose:  External nasal examination shows no deformity or inflammation. Nasal mucosa are pink and moist without lesions, exudates Neck:  No thyromegaly, masses, tenderness noted.    Heart:  Normal rate and regular rhythm. S1 and S2 normal without gallop, murmur, click, rub .  Lungs:  without wheezes, rhonchi, rales,  rubs. Abdomen: Bowel sounds are normal. Abdomen is soft and nontender with no organomegaly, hernias, masses. GU: Deferred  Extremities:  No cyanosis, clubbing, edema  Neurologic exam :Balance, Rhomberg, finger to nose testing could not be completed due to clinical state Skin: Warm & dry w/o tenting. No significant lesions or rash.  See summary under each active problem in the Problem List with associated updated therapeutic plan :  Acute metabolic encephalopathy 01/01/2025 she does awaken but remains unfocused and nonverbal.  She does not follow commands.  Her daughter and I discussed that this certainly may represent another stroke.  The daughter validated that performed a CT scan is not going to change anything based on the prior CT and MRI findings which I reviewed with her. We will recheck CMET and TSH to rule out any other etiologic factors.  Were liver enzymes to be elevated; ammonia level will be checked.  Uncontrolled type 2 diabetes mellitus with hyperglycemia, with long-term current use of insulin  (HCC) Nutritional intake has plummeted.  Glucoses are monitored AC and insulin  administered based on values.  It is critical to avoid hypoglycemia as this would mimic a TIA.  Unspecified protein-calorie malnutrition Interosseous wasting and limb atrophy document protein/caloric malnutrition.  Patient's intake has plummeted.  Vascular dementia [facility]) CNS imaging and MMSE findings reviewed with her daughter.  Hypotension Blood pressure has improved but remains soft at 100/54. Ester Fees, PharmD reviewed her med list.  He stated that the use of Trintellix  and venlafaxine  together is  not recommended due to the risk of serotonin syndrome.  Her daughter states that there was dramatic decompensation when she was taken off her venlafaxine  in the past.  Therefore the Trintellix  will be held for 5 days and her mental status reassessed. Additionally for antibiotic stewardship he recommended  reevaluating the use of Macrodantin .  The daughter indicates that this has been prescribed prophylactically because of recurrent UTIs.  In the absence of radiographic interstitial pulmonary changes with this drug; it will be continued for present.    "

## 2025-01-01 NOTE — Assessment & Plan Note (Signed)
 01/01/2025 she does awaken but remains unfocused and nonverbal.  She does not follow commands.  Her daughter and I discussed that this certainly may represent another stroke.  The daughter validated that performed a CT scan is not going to change anything based on the prior CT and MRI findings which I reviewed with her. We will recheck CMET and TSH to rule out any other etiologic factors.  Were liver enzymes to be elevated; ammonia level will be checked.

## 2025-01-01 NOTE — Assessment & Plan Note (Signed)
 Interosseous wasting and limb atrophy document protein/caloric malnutrition.  Patient's intake has plummeted.

## 2025-01-01 NOTE — Patient Instructions (Signed)
 See assessment and plan under each diagnosis in the problem list and acutely for this visit

## 2025-01-01 NOTE — Assessment & Plan Note (Signed)
 CNS imaging and MMSE findings reviewed with her daughter.

## 2025-01-02 ENCOUNTER — Non-Acute Institutional Stay (SKILLED_NURSING_FACILITY): Admitting: Adult Health

## 2025-01-02 ENCOUNTER — Encounter (HOSPITAL_COMMUNITY): Payer: Self-pay

## 2025-01-02 ENCOUNTER — Inpatient Hospital Stay (HOSPITAL_COMMUNITY)

## 2025-01-02 ENCOUNTER — Other Ambulatory Visit: Payer: Self-pay

## 2025-01-02 ENCOUNTER — Encounter: Payer: Self-pay | Admitting: Adult Health

## 2025-01-02 ENCOUNTER — Observation Stay (HOSPITAL_COMMUNITY)
Admission: EM | Admit: 2025-01-02 | Discharge: 2025-01-03 | Disposition: A | Discharge status: Skilled Nursing Facility | Source: Skilled Nursing Facility | Attending: Family Medicine | Admitting: Family Medicine

## 2025-01-02 ENCOUNTER — Emergency Department (HOSPITAL_COMMUNITY)

## 2025-01-02 ENCOUNTER — Other Ambulatory Visit (HOSPITAL_COMMUNITY)
Admission: RE | Admit: 2025-01-02 | Discharge: 2025-01-02 | Disposition: A | Discharge status: Home / Self Care | Source: Skilled Nursing Facility | Attending: Internal Medicine | Admitting: Internal Medicine

## 2025-01-02 DIAGNOSIS — G9341 Metabolic encephalopathy: Secondary | ICD-10-CM | POA: Insufficient documentation

## 2025-01-02 DIAGNOSIS — Z7984 Long term (current) use of oral hypoglycemic drugs: Secondary | ICD-10-CM | POA: Diagnosis not present

## 2025-01-02 DIAGNOSIS — I959 Hypotension, unspecified: Secondary | ICD-10-CM | POA: Diagnosis not present

## 2025-01-02 DIAGNOSIS — Z8673 Personal history of transient ischemic attack (TIA), and cerebral infarction without residual deficits: Secondary | ICD-10-CM | POA: Diagnosis not present

## 2025-01-02 DIAGNOSIS — F32A Depression, unspecified: Secondary | ICD-10-CM | POA: Diagnosis not present

## 2025-01-02 DIAGNOSIS — E1151 Type 2 diabetes mellitus with diabetic peripheral angiopathy without gangrene: Secondary | ICD-10-CM | POA: Diagnosis not present

## 2025-01-02 DIAGNOSIS — R531 Weakness: Secondary | ICD-10-CM | POA: Insufficient documentation

## 2025-01-02 DIAGNOSIS — E1142 Type 2 diabetes mellitus with diabetic polyneuropathy: Secondary | ICD-10-CM | POA: Diagnosis present

## 2025-01-02 DIAGNOSIS — E872 Acidosis, unspecified: Secondary | ICD-10-CM

## 2025-01-02 DIAGNOSIS — E039 Hypothyroidism, unspecified: Secondary | ICD-10-CM | POA: Diagnosis not present

## 2025-01-02 DIAGNOSIS — J45909 Unspecified asthma, uncomplicated: Secondary | ICD-10-CM | POA: Insufficient documentation

## 2025-01-02 DIAGNOSIS — A419 Sepsis, unspecified organism: Secondary | ICD-10-CM | POA: Diagnosis not present

## 2025-01-02 DIAGNOSIS — J101 Influenza due to other identified influenza virus with other respiratory manifestations: Secondary | ICD-10-CM | POA: Diagnosis not present

## 2025-01-02 DIAGNOSIS — K219 Gastro-esophageal reflux disease without esophagitis: Secondary | ICD-10-CM | POA: Insufficient documentation

## 2025-01-02 DIAGNOSIS — J209 Acute bronchitis, unspecified: Secondary | ICD-10-CM | POA: Insufficient documentation

## 2025-01-02 DIAGNOSIS — R051 Acute cough: Secondary | ICD-10-CM

## 2025-01-02 DIAGNOSIS — R6510 Systemic inflammatory response syndrome (SIRS) of non-infectious origin without acute organ dysfunction: Secondary | ICD-10-CM | POA: Diagnosis present

## 2025-01-02 DIAGNOSIS — I10 Essential (primary) hypertension: Secondary | ICD-10-CM | POA: Diagnosis present

## 2025-01-02 DIAGNOSIS — Z794 Long term (current) use of insulin: Secondary | ICD-10-CM | POA: Diagnosis not present

## 2025-01-02 DIAGNOSIS — Z7982 Long term (current) use of aspirin: Secondary | ICD-10-CM | POA: Insufficient documentation

## 2025-01-02 DIAGNOSIS — D519 Vitamin B12 deficiency anemia, unspecified: Secondary | ICD-10-CM | POA: Insufficient documentation

## 2025-01-02 DIAGNOSIS — Z79899 Other long term (current) drug therapy: Secondary | ICD-10-CM | POA: Diagnosis not present

## 2025-01-02 DIAGNOSIS — R4182 Altered mental status, unspecified: Secondary | ICD-10-CM | POA: Diagnosis present

## 2025-01-02 LAB — CBC WITH DIFFERENTIAL/PLATELET
Abs Immature Granulocytes: 0.2 K/uL — ABNORMAL HIGH (ref 0.00–0.07)
Basophils Absolute: 0.1 K/uL (ref 0.0–0.1)
Basophils Relative: 0 %
Eosinophils Absolute: 0.4 K/uL (ref 0.0–0.5)
Eosinophils Relative: 2 %
HCT: 43.7 % (ref 36.0–46.0)
Hemoglobin: 14.2 g/dL (ref 12.0–15.0)
Immature Granulocytes: 1 %
Lymphocytes Relative: 14 %
Lymphs Abs: 3 K/uL (ref 0.7–4.0)
MCH: 28.7 pg (ref 26.0–34.0)
MCHC: 32.5 g/dL (ref 30.0–36.0)
MCV: 88.3 fL (ref 80.0–100.0)
Monocytes Absolute: 1.6 K/uL — ABNORMAL HIGH (ref 0.1–1.0)
Monocytes Relative: 8 %
Neutro Abs: 15.2 K/uL — ABNORMAL HIGH (ref 1.7–7.7)
Neutrophils Relative %: 75 %
Platelets: 340 K/uL (ref 150–400)
RBC: 4.95 MIL/uL (ref 3.87–5.11)
RDW: 17 % — ABNORMAL HIGH (ref 11.5–15.5)
WBC: 20.5 K/uL — ABNORMAL HIGH (ref 4.0–10.5)
nRBC: 0 % (ref 0.0–0.2)

## 2025-01-02 LAB — GLUCOSE, CAPILLARY: Glucose-Capillary: 125 mg/dL — ABNORMAL HIGH (ref 70–99)

## 2025-01-02 LAB — COMPREHENSIVE METABOLIC PANEL WITH GFR
ALT: 29 U/L (ref 0–44)
ALT: 29 U/L (ref 0–44)
AST: 24 U/L (ref 15–41)
AST: 34 U/L (ref 15–41)
Albumin: 3.9 g/dL (ref 3.5–5.0)
Albumin: 3.5 g/dL (ref 3.5–5.0)
Alkaline Phosphatase: 62 U/L (ref 38–126)
Alkaline Phosphatase: 61 U/L (ref 38–126)
Anion gap: 13 (ref 5–15)
Anion gap: 21 — ABNORMAL HIGH (ref 5–15)
BUN: 26 mg/dL — ABNORMAL HIGH (ref 8–23)
BUN: 27 mg/dL — ABNORMAL HIGH (ref 8–23)
CO2: 27 mmol/L (ref 22–32)
CO2: 16 mmol/L — ABNORMAL LOW (ref 22–32)
Calcium: 9.4 mg/dL (ref 8.9–10.3)
Calcium: 9.2 mg/dL (ref 8.9–10.3)
Chloride: 96 mmol/L — ABNORMAL LOW (ref 98–111)
Chloride: 94 mmol/L — ABNORMAL LOW (ref 98–111)
Creatinine, Ser: 1.03 mg/dL — ABNORMAL HIGH (ref 0.44–1.00)
Creatinine, Ser: 1.13 mg/dL — ABNORMAL HIGH (ref 0.44–1.00)
GFR, Estimated: 54 mL/min — ABNORMAL LOW
GFR, Estimated: 48 mL/min — ABNORMAL LOW
Glucose, Bld: 129 mg/dL — ABNORMAL HIGH (ref 70–99)
Glucose, Bld: 153 mg/dL — ABNORMAL HIGH (ref 70–99)
Potassium: 4.2 mmol/L (ref 3.5–5.1)
Potassium: 4.7 mmol/L (ref 3.5–5.1)
Sodium: 137 mmol/L (ref 135–145)
Sodium: 131 mmol/L — ABNORMAL LOW (ref 135–145)
Total Bilirubin: 1 mg/dL (ref 0.0–1.2)
Total Bilirubin: 1.1 mg/dL (ref 0.0–1.2)
Total Protein: 7.3 g/dL (ref 6.5–8.1)
Total Protein: 7.1 g/dL (ref 6.5–8.1)

## 2025-01-02 LAB — LACTIC ACID, PLASMA
Lactic Acid, Venous: 2.8 mmol/L (ref 0.5–1.9)
Lactic Acid, Venous: 1.9 mmol/L (ref 0.5–1.9)

## 2025-01-02 LAB — URINALYSIS, ROUTINE W REFLEX MICROSCOPIC
Bilirubin Urine: NEGATIVE
Glucose, UA: NEGATIVE mg/dL
Hgb urine dipstick: NEGATIVE
Ketones, ur: NEGATIVE mg/dL
Leukocytes,Ua: NEGATIVE
Nitrite: NEGATIVE
Protein, ur: NEGATIVE mg/dL
Specific Gravity, Urine: 1.016 (ref 1.005–1.030)
pH: 5 (ref 5.0–8.0)

## 2025-01-02 LAB — TSH: TSH: 0.514 u[IU]/mL (ref 0.350–4.500)

## 2025-01-02 MED ORDER — MIDODRINE HCL 5 MG PO TABS
5.0000 mg | ORAL_TABLET | Freq: Three times a day (TID) | ORAL | Status: DC
  Administered 2025-01-03 (×2): 5 mg via ORAL
  Filled 2025-01-02 (×2): qty 1

## 2025-01-02 MED ORDER — ENOXAPARIN SODIUM 40 MG/0.4ML IJ SOSY
40.0000 mg | PREFILLED_SYRINGE | INTRAMUSCULAR | Status: DC
  Administered 2025-01-02: 40 mg via SUBCUTANEOUS
  Filled 2025-01-02: qty 0.4

## 2025-01-02 MED ORDER — UMECLIDINIUM BROMIDE 62.5 MCG/ACT IN AEPB
1.0000 | INHALATION_SPRAY | Freq: Every day | RESPIRATORY_TRACT | Status: DC
  Administered 2025-01-03: 1 via RESPIRATORY_TRACT
  Filled 2025-01-02: qty 7

## 2025-01-02 MED ORDER — SODIUM CHLORIDE 0.9 % IV SOLN: INTRAVENOUS | Status: AC

## 2025-01-02 MED ORDER — VENLAFAXINE HCL ER 75 MG PO CP24
150.0000 mg | ORAL_CAPSULE | ORAL | Status: DC
  Administered 2025-01-03: 150 mg via ORAL
  Filled 2025-01-02: qty 2

## 2025-01-02 MED ORDER — FLUTICASONE FUROATE-VILANTEROL 200-25 MCG/ACT IN AEPB
1.0000 | INHALATION_SPRAY | Freq: Every day | RESPIRATORY_TRACT | Status: DC
  Administered 2025-01-03: 1 via RESPIRATORY_TRACT
  Filled 2025-01-02: qty 28

## 2025-01-02 MED ORDER — SODIUM CHLORIDE 0.9 % IV BOLUS
1000.0000 mL | Freq: Once | INTRAVENOUS | Status: AC
  Administered 2025-01-02: 1000 mL via INTRAVENOUS

## 2025-01-02 MED ORDER — ACETAMINOPHEN 325 MG PO TABS: 650.0000 mg | ORAL_TABLET | Freq: Four times a day (QID) | ORAL | Status: DC | PRN

## 2025-01-02 MED ORDER — ONDANSETRON HCL 4 MG/2ML IJ SOLN: 4.0000 mg | Freq: Four times a day (QID) | INTRAMUSCULAR | Status: DC | PRN

## 2025-01-02 MED ORDER — PREGABALIN 75 MG PO CAPS
150.0000 mg | ORAL_CAPSULE | Freq: Two times a day (BID) | ORAL | Status: DC
  Administered 2025-01-02 – 2025-01-03 (×2): 150 mg via ORAL
  Filled 2025-01-02 (×2): qty 2

## 2025-01-02 MED ORDER — GUAIFENESIN-DM 100-10 MG/5ML PO SYRP
15.0000 mL | ORAL_SOLUTION | Freq: Three times a day (TID) | ORAL | Status: AC
  Administered 2025-01-02 – 2025-01-03 (×3): 15 mL via ORAL
  Filled 2025-01-02 (×3): qty 15

## 2025-01-02 MED ORDER — SODIUM CHLORIDE 0.9 % IV SOLN
2.0000 g | Freq: Once | INTRAVENOUS | Status: AC
  Administered 2025-01-02: 2 g via INTRAVENOUS
  Filled 2025-01-02: qty 20

## 2025-01-02 MED ORDER — ONDANSETRON HCL 4 MG PO TABS: 4.0000 mg | ORAL_TABLET | Freq: Four times a day (QID) | ORAL | Status: DC | PRN

## 2025-01-02 MED ORDER — METHYLPREDNISOLONE SODIUM SUCC 40 MG IJ SOLR
40.0000 mg | Freq: Two times a day (BID) | INTRAMUSCULAR | Status: DC
  Administered 2025-01-02 – 2025-01-03 (×2): 40 mg via INTRAVENOUS
  Filled 2025-01-02 (×2): qty 1

## 2025-01-02 MED ORDER — INSULIN ASPART 100 UNIT/ML IJ SOLN
0.0000 [IU] | Freq: Three times a day (TID) | INTRAMUSCULAR | Status: DC
  Administered 2025-01-03: 3 [IU] via SUBCUTANEOUS
  Administered 2025-01-03: 2 [IU] via SUBCUTANEOUS
  Filled 2025-01-02 (×2): qty 1

## 2025-01-02 MED ORDER — INSULIN ASPART 100 UNIT/ML IJ SOLN
0.0000 [IU] | Freq: Every day | INTRAMUSCULAR | Status: DC
  Filled 2025-01-02: qty 1

## 2025-01-02 MED ORDER — LEVOTHYROXINE SODIUM 100 MCG PO TABS
50.0000 ug | ORAL_TABLET | Freq: Every day | ORAL | Status: DC
  Administered 2025-01-03: 50 ug via ORAL
  Filled 2025-01-02: qty 1

## 2025-01-02 MED ORDER — ACETAMINOPHEN 650 MG RE SUPP: 650.0000 mg | Freq: Four times a day (QID) | RECTAL | Status: DC | PRN

## 2025-01-02 MED ORDER — IPRATROPIUM-ALBUTEROL 0.5-2.5 (3) MG/3ML IN SOLN: 3.0000 mL | RESPIRATORY_TRACT | Status: DC | PRN

## 2025-01-02 MED ORDER — POLYETHYLENE GLYCOL 3350 17 G PO PACK: 17.0000 g | PACK | Freq: Every day | ORAL | Status: DC | PRN

## 2025-01-02 NOTE — Addendum Note (Signed)
 Addended by: LANDY BARNIE RAMAN on: 01/02/2025 02:34 PM   Modules accepted: Orders, Level of Service

## 2025-01-02 NOTE — H&P (Signed)
 " History and Physical    [name] White FMW:[id number] DOB: [birth year] DOA: 01/02/2025  PCP: [name], MD   Patient coming from: [facility] [place]  I have personally briefly reviewed patient's old medical records in [facility]  Chief Complaint: AMS, SOB, Cough  HPI: [name] White is a 84 y.o. female with medical history significant for COPD, diabetes mellitus, hypertension, hypothyroidism. Patient presented to the ED from pain center with reports of altered mental status of 2 days, hypertension, difficulty breathing and cough.  O2 sats were low so she was placed on 3 L. Daughter reports yesterday when she saw patient, patient was not responding, but today, she did a 180, when they visited her she was sitting in a chair, dressed, and awake.  Has had poor oral intake over the past few days.  Trintellix  was just discontinued - ~ 2 days ago, as it was thought to be contributing to her lethargy.  Blood work was drawn at nursing home, lactic acid 2.8, WBC 20, was also noted to be wheezing/rhonchorous.  Patient was diagnosed with sepsis, started on IV Rocephin , normal saline.  Recently hospitalized 12/28 to 12/31-for acute metabolic encephalopathy, acute COPD exacerbation, hypoxia in the setting of influenza infection.  Treated with steroids, Tamiflu .  Daughter reports patient's cough is new and started after the influenza infection.  ED Course: Tmax 97.8.  Heart rate 87-105, respiratory rate 18-25.  Blood pressure systolic down to 10/42 improved to 130s.  On my evaluation, O2 sats > 96% on room air. WBC 20.5. Serum bicarb low at 16, anion gap of 13, anion gap of 21. Creatinine 1.13 Sodium 131. Portable chest x-ray without acute abnormality. UA not suggestive of UTI Blood cultures obtained. IV ceftriaxone  started.  Review of Systems: As per HPI all other systems reviewed and negative.  Past Medical History:  Diagnosis Date   Anemia    Anxiety    Asthma    ASYMPTOMATIC POSTMENOPAUSAL  STATUS 02/09/2009   Cataract    Cerebral infarction [facility])    Constipation 03/19/2013   Depression    DIABETES MELLITUS, TYPE II 09/14/2007   HYPERCHOLESTEROLEMIA 02/09/2009   HYPERTENSION 02/09/2009   HYPOTHYROIDISM 09/14/2007   Metabolic encephalopathy    MIGRAINE HEADACHE 09/14/2007   Neuropathy    OSTEOPOROSIS 02/09/2009   PANCREATITIS 09/14/2007   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   PONV (postoperative nausea and vomiting)    Protein calorie malnutrition    Rectocele 03/19/2013   Shortness of breath    SUPERFICIAL PHLEBITIS 09/14/2007   Varicose veins     Past Surgical History:  Procedure Laterality Date   ABDOMINAL HYSTERECTOMY     ANTERIOR AND POSTERIOR REPAIR N/A 04/02/2013   Procedure: ANTERIOR (CYSTOCELE) AND POSTERIOR REPAIR (RECTOCELE);  Surgeon: Norleen LULLA Server, MD;  Location: [facility];  Service: Gynecology;  Laterality: N/A;   APPENDECTOMY     BRAIN SURGERY     CATARACT EXTRACTION W/PHACO  12/05/2011   Procedure: CATARACT EXTRACTION PHACO AND INTRAOCULAR LENS PLACEMENT (IOC);  Surgeon: Cherene Mania;  Location: [facility];  Service: Ophthalmology;  Laterality: Left;  CDE:9.65   CHOLECYSTECTOMY     CRANIOTOMY N/A 06/04/2020   Procedure: Endoscopic Transphenoidal Resection of Recurrent PituitaryTumor;  Surgeon: Colon Shove, MD;  Location: [facility] OR;  Service: Neurosurgery;  Laterality: N/A;   CYSTOSCOPY     ENDOVENOUS ABLATION SAPHENOUS VEIN W/ LASER  11-03-2011   right greater saphenous vein   left leg done 10-2011   EYE SURGERY  98  right cataract extraction 98   LAPAROSCOPIC NISSEN FUNDOPLICATION     NM ESOPHAGEAL REFLUX  08-11-11   PITUITARY EXCISION  10/1997   POSTERIOR REPAIR     TRANSNASAL APPROACH N/A 06/04/2020   Procedure: TRANSNASAL APPROACH;  Surgeon: Mable Lenis, MD;  Location: [facility] OR;  Service: ENT;  Laterality: N/A;   TRANSPHENOIDAL / TRANSNASAL HYPOPHYSECTOMY / RESECTION PITUITARY TUMOR  08-11-11     reports that she has never smoked. She has never used  smokeless tobacco. She reports that she does not drink alcohol and does not use drugs.  Allergies[1]  Family History  Problem Relation Age of Onset   Heart disease Mother    Asthma Mother    COPD Father    Heart disease Father    Stroke Father    Asthma Daughter    Migraines Daughter    Neuropathy Son    Cancer Neg Hx    Anesthesia problems Neg Hx    Hypotension Neg Hx    Malignant hyperthermia Neg Hx    Pseudochol deficiency Neg Hx     Prior to Admission medications  Medication Sig Start Date End Date Taking? Authorizing Provider  acetaminophen  (TYLENOL ) 325 MG tablet Take 2 tablets (650 mg total) by mouth every 6 (six) hours as needed for mild pain (pain score 1-3) (or Fever >/= 101). 01/01/24   ShahmehdiAdriana LABOR, MD  albuterol  (VENTOLIN  HFA) 108 (90 Base) MCG/ACT inhaler Inhale 2 puffs into the lungs every 4 (four) hours as needed for wheezing or shortness of breath. 05/01/24   Johnson, Clanford L, MD  aspirin  EC 81 MG tablet Take 81 mg by mouth every morning. Swallow whole.    [provider]  atorvastatin  (LIPITOR) 80 MG tablet Take 80 mg by mouth every morning.    [provider]  busPIRone  (BUSPAR ) 5 MG tablet Take 5 mg by mouth 3 (three) times daily as needed (anxiety).    [provider]  cefTRIAXone  (ROCEPHIN ) IVPB Inject 1 g into the vein daily.    [provider]  cyanocobalamin  (VITAMIN B12) 1000 MCG/ML injection Inject 1,000 mcg into the muscle every 30 (thirty) days. Given on the 23rd of each month 07/26/24   [provider]  cycloSPORINE  (RESTASIS ) 0.05 % ophthalmic emulsion Place 1 drop into both eyes 2 (two) times daily. Given at 0900 and 2100    [provider]  Estradiol  (IMVEXXY  MAINTENANCE PACK) 10 MCG INST Place 1 suppository vaginally 2 (two) times a week. Mondays and Thursdays 12/26/24   Dino Antu, MD  fluticasone  furoate-vilanterol (BREO ELLIPTA ) 200-25 MCG/ACT AEPB Inhale 1 puff into the lungs daily.  10/16/24   Ricky Fines, MD  furosemide  (LASIX ) 20 MG tablet Take 1 tablet (20 mg total) by mouth daily. 11/12/24 11/12/25  Evonnie Lenis, MD  insulin  aspart (NOVOLOG ) 100 UNIT/ML injection Inject 7 Units into the skin 3 (three) times daily with meals.    [provider]  insulin  glargine (LANTUS ) 100 UNIT/ML injection Inject 10 Units into the skin at bedtime.    [provider]  ipratropium-albuterol  (DUONEB) 0.5-2.5 (3) MG/3ML SOLN Take 3 mLs by nebulization every 4 (four) hours as needed (wheezing and SOB). 10/16/24   Ricky Fines, MD  ipratropium-albuterol  (DUONEB) 0.5-2.5 (3) MG/3ML SOLN Take 3 mLs by nebulization 3 (three) times daily. Given at 0900, 1200 and 2100    [provider]  levothyroxine  (SYNTHROID ) 50 MCG tablet Take 50 mcg by mouth daily before breakfast.    [provider]  metFORMIN  (GLUCOPHAGE ) 1000 MG tablet Take 1,000 mg by mouth 2 (two) times daily. Given at 0800 and 1800 10/01/24   [provider]  midodrine  (PROAMATINE ) 5 MG tablet Take 5 mg by mouth 3 (three) times daily with meals. Given at 0800, 1200 and 1800 06/05/24   [provider]  Multiple Vitamins-Minerals (CENTRUM SILVER PO) Take 1 tablet by mouth every morning.    [provider]  nitrofurantoin  (MACRODANTIN ) 50 MG capsule Take 1 capsule (50 mg total) by mouth at bedtime. 11/27/24   McKenzie, Belvie CROME, MD  omeprazole  (PRILOSEC) 20 MG capsule Take 1 capsule (20 mg total) by mouth 2 (two) times daily before a meal. 10/16/24   Ricky Fines, MD  polyethylene glycol (MIRALAX  / GLYCOLAX ) 17 g packet Take 17 g by mouth daily as needed for mild constipation. 05/01/24   Vicci Afton CROME, MD  potassium chloride  (KLOR-CON ) 10 MEQ tablet Take 10 mEq by mouth every morning. 05/15/24   [provider]  pregabalin  (LYRICA ) 150 MG capsule Take 1 capsule (150 mg total) by mouth 2 (two) times daily. Given at 0900 and 2100 12/25/24   Rashid, Farhan, MD   rizatriptan  (MAXALT ) 10 MG tablet Take 10 mg by mouth daily as needed for migraine. 09/12/24   [provider]  sodium chloride  0.9 % SOLN Inject 100 mLs into the vein continuous.    [provider]  tiZANidine  (ZANAFLEX ) 2 MG tablet Take 2 mg by mouth 2 (two) times daily as needed for muscle spasms. 06/21/24   [provider]  traZODone  (DESYREL ) 50 MG tablet Take 1 tablet (50 mg total) by mouth at bedtime as needed for sleep. 03/19/24   Pearlean Manus, MD  TRINTELLIX  10 MG TABS tablet Take 10 mg by mouth every morning. 03/09/24   [provider]  umeclidinium bromide  (INCRUSE ELLIPTA ) 62.5 MCG/ACT AEPB Inhale 1 puff into the lungs daily. 10/16/24   Ricky Fines, MD  venlafaxine  (EFFEXOR -XR) 150 MG 24 hr capsule Take 150 mg by mouth every morning.    [provider]    Physical Exam: Vitals:   01/02/25 1600 01/02/25 1615 01/02/25 1847 01/02/25 1849  BP: (!) 108/93 (!) 89/57 (!) 102/58   Pulse: (!) 105 (!) 101 92   Resp: (!) 22 18 18    Temp: 97.7 F (36.5 C)   97.8 F (36.6 C)  TempSrc: Oral   Oral  SpO2: 98% 96% 97%   Weight:      Height:        Constitutional: NAD, calm, comfortable Vitals:   01/02/25 1600 01/02/25 1615 01/02/25 1847 01/02/25 1849  BP: (!) 108/93 (!) 89/57 (!) 102/58   Pulse: (!) 105 (!) 101 92   Resp: (!) 22 18 18    Temp: 97.7 F (36.5 C)   97.8 F (36.6 C)  TempSrc: Oral   Oral  SpO2: 98% 96% 97%   Weight:      Height:       Eyes: PERRL, lids and conjunctivae normal ENMT: Mucous membranes are markedly dry Neck: normal, supple, no masses, no thyromegaly Respiratory: clear to auscultation bilaterally, no wheezing, no crackles. Normal respiratory effort. No accessory muscle use.  Cardiovascular: Regular rate and rhythm, no murmurs / rubs / gallops. No extremity edema.  Extremities warm. Abdomen: no tenderness, no masses palpated. No hepatosplenomegaly. Bowel sounds positive.  Musculoskeletal: no clubbing /  cyanosis. No joint deformity upper and lower extremities.  Skin: no rashes, lesions, ulcers. No induration Neurologic: No  facial asymmetry, moves extremity spontaneously, speech fluent.SABRA  Psychiatric:   Awake and alert oriented to person and place, able to answer questions appropriately  Labs on Admission: I have personally reviewed following labs and imaging studies  CBC: Recent Labs  Lab 12/27/24 1957 12/27/24 2025 01/02/25 1334  WBC 18.0*  --  20.5*  NEUTROABS  --   --  15.2*  HGB 11.6* 12.9 14.2  HCT 35.6* 38.0 43.7  MCV 87.5  --  88.3  PLT 458*  --  340   Basic Metabolic Panel: Recent Labs  Lab 12/27/24 1957 12/27/24 2025 01/02/25 0500 01/02/25 1334  NA 134* 133* 137 131*  K 4.6 4.4 4.2 4.7  CL 92* 93* 96* 94*  CO2 22  --  27 16*  GLUCOSE 517* 514* 129* 153*  BUN 18 17 26* 27*  CREATININE 0.80 0.80 1.03* 1.13*  CALCIUM  9.1  --  9.4 9.2   GFR: Estimated Creatinine Clearance: 35.3 mL/min (A) (by C-G formula based on SCr of 1.13 mg/dL (H)). Liver Function Tests: Recent Labs  Lab 12/27/24 1957 01/02/25 0500 01/02/25 1334  AST 21 24 34  ALT 47* 29 29  ALKPHOS 59 62 61  BILITOT 0.4 1.0 1.1  PROT 7.0 7.3 7.1  ALBUMIN 4.0 3.9 3.5   BNP (last 3 results) Recent Labs    11/08/24 1329 12/22/24 0848  PROBNP 1,197.0* 191.0   HbA1C: No results for input(s): HGBA1C in the last 72 hours. CBG: Recent Labs  Lab 12/27/24 2000 12/27/24 2317  GLUCAP 509* 205*   Thyroid  Function Tests: Recent Labs    01/02/25 0500  TSH 0.514   Urine analysis:    Component Value Date/Time   COLORURINE YELLOW 01/02/2025 1620   APPEARANCEUR HAZY (A) 01/02/2025 1620   APPEARANCEUR Cloudy (A) 10/02/2024 1005   LABSPEC 1.016 01/02/2025 1620   PHURINE 5.0 01/02/2025 1620   GLUCOSEU NEGATIVE 01/02/2025 1620   GLUCOSEU >=1000 (A) 06/25/2020 1441   HGBUR NEGATIVE 01/02/2025 1620   BILIRUBINUR NEGATIVE 01/02/2025 1620   BILIRUBINUR Negative 10/02/2024 1005   KETONESUR  NEGATIVE 01/02/2025 1620   PROTEINUR NEGATIVE 01/02/2025 1620   UROBILINOGEN 0.2 06/25/2020 1441   NITRITE NEGATIVE 01/02/2025 1620   LEUKOCYTESUR NEGATIVE 01/02/2025 1620    Radiological Exams on Admission: DG Chest Port 1 View Result Date: 01/02/2025 CLINICAL DATA:  Hypoxia with altered mental status x2 days. EXAM: PORTABLE CHEST 1 VIEW COMPARISON:  None Available. FINDINGS: The heart size and mediastinal contours are within normal limits. There is mild to moderate severity calcification of the aortic arch and tortuosity of the descending thoracic aorta. Low lung volumes are noted. No acute infiltrate, pleural effusion or pneumothorax is identified. Radiopaque surgical clips are seen within the right upper quadrant. Multilevel degenerative changes are noted throughout the thoracic spine. IMPRESSION: Low lung volumes without evidence of acute or active cardiopulmonary disease. Electronically Signed   By: Suzen Dials M.D.   On: 01/02/2025 17:02   EKG: Independently reviewed.  Sinus rhythm, rate 107, QTc 461.  No significant change from prior.  Assessment/Plan Principal Problem:   SIRS (systemic inflammatory response syndrome) (HCC) Active Problems:   Influenza A   Hypothyroidism   Essential hypertension   Type 2 diabetes mellitus with peripheral neuropathy (HCC)   Hypotension  Assessment and Plan:  SIRS with hypotension-systolic down to 10/42 improved with fluids.  Tachycardic heart rate 87-105, tachypnea respirate rate 15-22.  With leukocytosis of 20.5.  Lactic acid 2.8.  Presenting with respiratory symptoms, cough  and dyspnea with initial hypoxia, she is on room air here.  Recent flu diagnosis 12/28. - Considering significant leukocytosis, CT chest without contrast was done-diffuse bronchial wall thickening with bibasilar atelectasis - Initial wheezing and rhonchi today per outpatient notes, status post DuoNebs lungs clear on my evaluation - IV Solu-Medrol  40 twice daily -DuoNebs,  mucolytics - IV ceftriaxone  started, hold further antibiotics at this time, no focus of infection identified - Follow-up blood cultures and urine cultures - Check procalcitonin - Incentive spirometry  Lactic acidosis of 2.8, may be 2/2 dehydration/hypotension in the setting of poor oral intake, mucous membranes very dry. - 2 L bolus given, continue N/s 75cc/hr x 15hrs - Trend lactic acid  Possible bronchitis, recent influenza A infection with hypoxia during recent hospitalization 12/28 to 12/31, completed course of Tamiflu .  Rhonchi or wheezing documented as outpatient today, clear lung exam on my evaluation, patient status post DuoNebs,  Hypoxia reported PTA, currently on room air.  CT showing bronchial wall thickening.  Acute metabolic encephalopathy-significantly improved today per family.  In setting of dehydration, SIRs.  Trintellix  was just discontinued due to concern it was causing lethargy.  Head CT 12/22/2024-no acute abnormality. - Monitor for now - Family insists that patient's Lyrica  not be discontinued. Discontinuing this medication adds at least 2 days to her length of stay.  It significantly helps her mental status, particularly with Effexor .- -Hold Trintellix   Hypothyroidism - Resume Synthroid   Hypertension-presently hypotensive. - Hold Lasix  20 mg daily, - Resume midodrine  5 mg 3 times daily  Diabetes mellitus - SSI- M -Hold Lantus  10 units for now   DVT prophylaxis: Lovenox  Code Status: DNR- form at bedside. Family Communication: Patient's 2 daughters Olam and Katheryn at bedside Disposition Plan: ~ 2 days Consults called:  None Admission status: Inpt tele I certify that at the point of admission it is my clinical judgment that the patient will require inpatient hospital care spanning beyond 2 midnights from the point of admission due to high intensity of service, high risk for further deterioration and high frequency of surveillance required.  Author: Tully FORBES Carwin, MD 01/02/2025 9:28 PM  For on call review [email or web address].      [1]  Allergies Allergen Reactions   Betadine [Povidone Iodine] Hives   Povidone-Iodine Hives   Penicillins Rash and Dermatitis   "

## 2025-01-02 NOTE — Progress Notes (Addendum)
 " Location:  [facility] Room Number: 126 Place of Service:  SNF (31)   CODE STATUS: dnr   Allergies[1]  Chief Complaint  Patient presents with   Acute Visit    cough    HPI:  Staff report that she has developed a congested cough with decreased 02 sats in the 80's. She is wearing 02 at 3 liters. She is awake; is not eating her lunch and declined my assistance. There are no reports of fevers present. She does have some shortness of breath present.  Her lactic acid returned at 2.8; with wbc 20. Will begin NS IVF and rocephin  IV. Will monitor her status.   Past Medical History:  Diagnosis Date   Anemia    Anxiety    Asthma    ASYMPTOMATIC POSTMENOPAUSAL STATUS 02/09/2009   Cataract    Cerebral infarction [facility])    Constipation 03/19/2013   Depression    DIABETES MELLITUS, TYPE II 09/14/2007   HYPERCHOLESTEROLEMIA 02/09/2009   HYPERTENSION 02/09/2009   HYPOTHYROIDISM 09/14/2007   Metabolic encephalopathy    MIGRAINE HEADACHE 09/14/2007   Neuropathy    OSTEOPOROSIS 02/09/2009   PANCREATITIS 09/14/2007   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   PONV (postoperative nausea and vomiting)    Protein calorie malnutrition    Rectocele 03/19/2013   Shortness of breath    SUPERFICIAL PHLEBITIS 09/14/2007   Varicose veins     Past Surgical History:  Procedure Laterality Date   ABDOMINAL HYSTERECTOMY     ANTERIOR AND POSTERIOR REPAIR N/A 04/02/2013   Procedure: ANTERIOR (CYSTOCELE) AND POSTERIOR REPAIR (RECTOCELE);  Surgeon: Norleen LULLA Server, MD;  Location: [facility] ORS;  Service: Gynecology;  Laterality: N/A;   APPENDECTOMY     BRAIN SURGERY     CATARACT EXTRACTION W/PHACO  12/05/2011   Procedure: CATARACT EXTRACTION PHACO AND INTRAOCULAR LENS PLACEMENT (IOC);  Surgeon: Cherene Mania;  Location: [facility];  Service: Ophthalmology;  Laterality: Left;  CDE:9.65   CHOLECYSTECTOMY     CRANIOTOMY N/A 06/04/2020   Procedure: Endoscopic Transphenoidal Resection of Recurrent  PituitaryTumor;  Surgeon: Colon Shove, MD;  Location: [facility] OR;  Service: Neurosurgery;  Laterality: N/A;   CYSTOSCOPY     ENDOVENOUS ABLATION SAPHENOUS VEIN W/ LASER  11-03-2011   right greater saphenous vein   left leg done 10-2011   EYE SURGERY  98   right cataract extraction 98   LAPAROSCOPIC NISSEN FUNDOPLICATION     NM ESOPHAGEAL REFLUX  08-11-11   PITUITARY EXCISION  10/1997   POSTERIOR REPAIR     TRANSNASAL APPROACH N/A 06/04/2020   Procedure: TRANSNASAL APPROACH;  Surgeon: Mable Lenis, MD;  Location: [facility] OR;  Service: ENT;  Laterality: N/A;   TRANSPHENOIDAL / TRANSNASAL HYPOPHYSECTOMY / RESECTION PITUITARY TUMOR  08-11-11    Social History   Socioeconomic History   Marital status: Married    Spouse name: Not on file   Number of children: Not on file   Years of education: Not on file   Highest education level: Not on file  Occupational History   Occupation: Retired    Associate Professor: RETIRED  Tobacco Use   Smoking status: Never   Smokeless tobacco: Never  Vaping Use   Vaping status: Never Used  Substance and Sexual Activity   Alcohol use: No    Alcohol/week: 0.0 standard drinks of alcohol   Drug use: No   Sexual activity: Not Currently    Birth control/protection: Surgical  Other Topics Concern   Not on file  Social History  Narrative   Not on file   Social Drivers of Health   Tobacco Use: Low Risk (01/02/2025)   Patient History    Smoking Tobacco Use: Never    Smokeless Tobacco Use: Never    Passive Exposure: Not on file  Financial Resource Strain: Not on file  Food Insecurity: Patient Unable To Answer (12/22/2024)   Epic    Worried About Programme Researcher, Broadcasting/film/video in the Last Year: Patient unable to answer    Ran Out of Food in the Last Year: Patient unable to answer  Transportation Needs: Patient Unable To Answer (12/22/2024)   Epic    Lack of Transportation (Medical): Patient unable to answer    Lack of Transportation (Non-Medical): Patient unable to answer   Physical Activity: Not on file  Stress: Not on file  Social Connections: Patient Unable To Answer (12/22/2024)   Social Connection and Isolation Panel    Frequency of Communication with Friends and Family: Patient unable to answer    Frequency of Social Gatherings with Friends and Family: Patient unable to answer    Attends Religious Services: Patient unable to answer    Active Member of Clubs or Organizations: Patient unable to answer    Attends Banker Meetings: Patient unable to answer    Marital Status: Patient unable to answer  Recent Concern: Social Connections - Socially Isolated (11/08/2024)   Social Connection and Isolation Panel    Frequency of Communication with Friends and Family: More than three times a week    Frequency of Social Gatherings with Friends and Family: Twice a week    Attends Religious Services: Never    Database Administrator or Organizations: No    Attends Banker Meetings: Never    Marital Status: Widowed  Intimate Partner Violence: Patient Unable To Answer (12/22/2024)   Epic    Fear of Current or Ex-Partner: Patient unable to answer    Emotionally Abused: Patient unable to answer    Physically Abused: Patient unable to answer    Sexually Abused: Patient unable to answer  Depression (PHQ2-9): Low Risk (10/28/2024)   Depression (PHQ2-9)    PHQ-2 Score: 0  Alcohol Screen: Not on file  Housing: Unknown (12/22/2024)   Epic    Unable to Pay for Housing in the Last Year: Patient unable to answer    Number of Times Moved in the Last Year: 0    Homeless in the Last Year: Patient unable to answer  Utilities: Patient Unable To Answer (12/22/2024)   Epic    Threatened with loss of utilities: Patient unable to answer  Health Literacy: Not on file   Family History  Problem Relation Age of Onset   Heart disease Mother    Asthma Mother    COPD Father    Heart disease Father    Stroke Father    Asthma Daughter    Migraines  Daughter    Neuropathy Son    Cancer Neg Hx    Anesthesia problems Neg Hx    Hypotension Neg Hx    Malignant hyperthermia Neg Hx    Pseudochol deficiency Neg Hx       VITAL SIGNS BP (!) 110/58   Pulse 85   Temp (!) 97.5 F (36.4 C)   Resp 20   Ht 5' 6 (1.676 m)   Wt 137 lb 12.8 oz (62.5 kg)   SpO2 93%   PF (!) 3 L/min   BMI 22.24 kg/m   Outpatient  Encounter Medications as of 01/02/2025  Medication Sig Note   ipratropium-albuterol  (DUONEB) 0.5-2.5 (3) MG/3ML SOLN Take 3 mLs by nebulization every 4 (four) hours as needed (wheezing and SOB). 12/22/2024: Two entries on MAR for same medication.   acetaminophen  (TYLENOL ) 325 MG tablet Take 2 tablets (650 mg total) by mouth every 6 (six) hours as needed for mild pain (pain score 1-3) (or Fever >/= 101).    albuterol  (VENTOLIN  HFA) 108 (90 Base) MCG/ACT inhaler Inhale 2 puffs into the lungs every 4 (four) hours as needed for wheezing or shortness of breath.    aspirin  EC 81 MG tablet Take 81 mg by mouth every morning. Swallow whole.    atorvastatin  (LIPITOR) 80 MG tablet Take 80 mg by mouth every morning.    busPIRone  (BUSPAR ) 5 MG tablet Take 5 mg by mouth 3 (three) times daily as needed (anxiety).    cyanocobalamin  (VITAMIN B12) 1000 MCG/ML injection Inject 1,000 mcg into the muscle every 30 (thirty) days. Given on the 23rd of each month    cycloSPORINE  (RESTASIS ) 0.05 % ophthalmic emulsion Place 1 drop into both eyes 2 (two) times daily. Given at 0900 and 2100    Estradiol  (IMVEXXY  MAINTENANCE PACK) 10 MCG INST Place 1 suppository vaginally 2 (two) times a week. Mondays and Thursdays    fluticasone  furoate-vilanterol (BREO ELLIPTA ) 200-25 MCG/ACT AEPB Inhale 1 puff into the lungs daily.    furosemide  (LASIX ) 20 MG tablet Take 1 tablet (20 mg total) by mouth daily.    insulin  aspart (NOVOLOG ) 100 UNIT/ML injection Inject 5 Units into the skin 3 (three) times daily before meals.    ipratropium-albuterol  (DUONEB) 0.5-2.5 (3) MG/3ML SOLN  Take 3 mLs by nebulization 3 (three) times daily. Given at 0900, 1200 and 2100 12/22/2024: Two entries on MAR for same medication.   levothyroxine  (SYNTHROID ) 50 MCG tablet Take 50 mcg by mouth daily before breakfast.    metFORMIN  (GLUCOPHAGE ) 1000 MG tablet Take 1,000 mg by mouth 2 (two) times daily. Given at 0800 and 1800    midodrine  (PROAMATINE ) 5 MG tablet Take 5 mg by mouth 3 (three) times daily with meals. Given at 0800, 1200 and 1800    Multiple Vitamins-Minerals (CENTRUM SILVER PO) Take 1 tablet by mouth every morning.    nitrofurantoin  (MACRODANTIN ) 50 MG capsule Take 1 capsule (50 mg total) by mouth at bedtime. 12/22/2024: Per MAR, patient is on this long term.   omeprazole  (PRILOSEC) 20 MG capsule Take 1 capsule (20 mg total) by mouth 2 (two) times daily before a meal.    polyethylene glycol (MIRALAX  / GLYCOLAX ) 17 g packet Take 17 g by mouth daily as needed for mild constipation.    potassium chloride  (KLOR-CON ) 10 MEQ tablet Take 10 mEq by mouth every morning.    pregabalin  (LYRICA ) 150 MG capsule Take 1 capsule (150 mg total) by mouth 2 (two) times daily. Given at 0900 and 2100    rizatriptan  (MAXALT ) 10 MG tablet Take 10 mg by mouth daily as needed for migraine.    tiZANidine  (ZANAFLEX ) 2 MG tablet Take 2 mg by mouth 2 (two) times daily as needed for muscle spasms.    traZODone  (DESYREL ) 50 MG tablet Take 1 tablet (50 mg total) by mouth at bedtime as needed for sleep.    TRINTELLIX  10 MG TABS tablet Take 10 mg by mouth every morning.    umeclidinium bromide  (INCRUSE ELLIPTA ) 62.5 MCG/ACT AEPB Inhale 1 puff into the lungs daily.    venlafaxine  (EFFEXOR -XR) 150  MG 24 hr capsule Take 150 mg by mouth every morning.    [DISCONTINUED] oseltamivir  (TAMIFLU ) 75 MG capsule Take 1 capsule (75 mg total) by mouth 2 (two) times daily.    [DISCONTINUED] predniSONE  (DELTASONE ) 20 MG tablet Take 2 tablets (40 mg total) by mouth daily with breakfast.    No facility-administered encounter  medications on file as of 01/02/2025.     SIGNIFICANT DIAGNOSTIC EXAMS  LABS  12-10-24: wbc 13.9; hgb 11.2; hct 34.2; mcv 89.1 plt 245 glucose 176; bun 15; creat 0.59; k+ 4.7; na++ 135; ca 8.5 gfr >60; protein 6.4 albumin 3.9; blood culture: no growth; UA; neg 12-12-24: mag 1.5 12-15-24; wbc 10.1; hgb 10.7; hct 336.; mcv 90.3 pt 227; glucose 174; bun 6; creat 0.52; k+ 4.4; na++ 137; ca 8.6; gfr >60 mag 1.9 12-22-24: wbc 7.1; hgb 11.6; hct 37.0; mcv 90.7 plt 368; glucose 63; bun 19; creat 0.68; k+ 4.0; na++ 135; ca 8.2 gfr >60 protein 6.3 albumin 3.4    TODAY  01-02-25: wbc 20.5; hgb 14.2; hct 43.2; mcv 88.3 plt 340; glucose 153; bun 14.2; creat 1.13; k+ 4.7; na++ 131; ca 9.2; protein 7.1; albumin 3.5 tsh 0.514 lactic acid 2.8   Review of Systems  Reason unable to perform ROS: unable to participate.    Physical Exam Constitutional:      General: She is not in acute distress.    Appearance: She is well-developed. She is not diaphoretic.  Neck:     Thyroid : No thyromegaly.  Cardiovascular:     Rate and Rhythm: Normal rate and regular rhythm.     Heart sounds: Normal heart sounds.  Pulmonary:     Effort: Pulmonary effort is normal. No respiratory distress.     Breath sounds: Wheezing and rhonchi present.  Abdominal:     General: Bowel sounds are normal. There is no distension.     Palpations: Abdomen is soft.     Tenderness: There is no abdominal tenderness.  Musculoskeletal:        General: Normal range of motion.     Cervical back: Neck supple.     Right lower leg: No edema.     Left lower leg: No edema.  Lymphadenopathy:     Cervical: No cervical adenopathy.  Skin:    General: Skin is warm and dry.  Neurological:     Mental Status: She is alert. Mental status is at baseline.  Psychiatric:        Mood and Affect: Mood normal.      ASSESSMENT/ PLAN:   TODAY  Acute cough: will get cbc; cmp; lactic acid and will get a chest x-ray and will treat as indicated.  2.  Sepsis: will begin rocephin  1 mg IV through 01-09-25 Will  begin IVF: NS at 100 cc per hour  3. Diabetes mellitus: will begin lantus  10 units nightly and will increase novolog  to 7 units with meals.     Unable to get IV site will send to the ED.   Barnie Seip NP [facility]  call [phone]      [1]  Allergies Allergen Reactions   Betadine [Povidone Iodine] Hives   Povidone-Iodine Hives   Penicillins Rash and Dermatitis   "

## 2025-01-02 NOTE — ED Notes (Signed)
 Patient transported to CT

## 2025-01-02 NOTE — ED Triage Notes (Signed)
 Pt arrived via [facility] from [facility] for concerns of Sepsis. EMS report the facility called after the Pts Lactic returned at 3.0, and was found hypoxic, AMS X 2 days and hypotensive. DNR presented to this RN during Triage.

## 2025-01-03 DIAGNOSIS — R6510 Systemic inflammatory response syndrome (SIRS) of non-infectious origin without acute organ dysfunction: Secondary | ICD-10-CM | POA: Diagnosis not present

## 2025-01-03 LAB — BASIC METABOLIC PANEL WITH GFR
Anion gap: 12 (ref 5–15)
BUN: 22 mg/dL (ref 8–23)
CO2: 22 mmol/L (ref 22–32)
Calcium: 7.6 mg/dL — ABNORMAL LOW (ref 8.9–10.3)
Chloride: 102 mmol/L (ref 98–111)
Creatinine, Ser: 0.74 mg/dL (ref 0.44–1.00)
GFR, Estimated: >60 mL/min
Glucose, Bld: 199 mg/dL — ABNORMAL HIGH (ref 70–99)
Potassium: 4.1 mmol/L (ref 3.5–5.1)
Sodium: 136 mmol/L (ref 135–145)

## 2025-01-03 LAB — URINE CULTURE: Culture: NO GROWTH

## 2025-01-03 LAB — CBC
HCT: 34.2 % — ABNORMAL LOW (ref 36.0–46.0)
Hemoglobin: 11 g/dL — ABNORMAL LOW (ref 12.0–15.0)
MCH: 28.9 pg (ref 26.0–34.0)
MCHC: 32.2 g/dL (ref 30.0–36.0)
MCV: 90 fL (ref 80.0–100.0)
Platelets: 244 K/uL (ref 150–400)
RBC: 3.8 MIL/uL — ABNORMAL LOW (ref 3.87–5.11)
RDW: 17 % — ABNORMAL HIGH (ref 11.5–15.5)
WBC: 15.3 K/uL — ABNORMAL HIGH (ref 4.0–10.5)
nRBC: 0 % (ref 0.0–0.2)

## 2025-01-03 LAB — GLUCOSE, CAPILLARY
Glucose-Capillary: 195 mg/dL — ABNORMAL HIGH (ref 70–99)
Glucose-Capillary: 224 mg/dL — ABNORMAL HIGH (ref 70–99)

## 2025-01-03 MED ORDER — METFORMIN HCL 850 MG PO TABS: 850.0000 mg | ORAL_TABLET | Freq: Two times a day (BID) | ORAL | 4 refills | Status: AC

## 2025-01-03 MED ORDER — ASPIRIN 81 MG PO TBEC: 81.0000 mg | DELAYED_RELEASE_TABLET | Freq: Every morning | ORAL | 4 refills | Status: AC

## 2025-01-03 MED ORDER — ALBUTEROL SULFATE (2.5 MG/3ML) 0.083% IN NEBU: 2.5000 mg | INHALATION_SOLUTION | RESPIRATORY_TRACT | 2 refills | Status: DC | PRN

## 2025-01-03 MED ORDER — GLUCERNA SHAKE PO LIQD: 237.0000 mL | Freq: Three times a day (TID) | ORAL | 5 refills | Status: AC

## 2025-01-03 MED ORDER — PREDNISONE 20 MG PO TABS: 40.0000 mg | ORAL_TABLET | Freq: Every day | ORAL | 0 refills | Status: AC

## 2025-01-03 MED ORDER — ALBUTEROL SULFATE HFA 108 (90 BASE) MCG/ACT IN AERS: 2.0000 | INHALATION_SPRAY | RESPIRATORY_TRACT | 3 refills | Status: DC | PRN

## 2025-01-03 MED ORDER — FUROSEMIDE 20 MG PO TABS: 20.0000 mg | ORAL_TABLET | ORAL | 3 refills | Status: AC

## 2025-01-03 MED ORDER — INSULIN REGULAR HUMAN 100 UNIT/ML IJ SOLN: 0.0000 [IU] | Freq: Three times a day (TID) | INTRAMUSCULAR | 11 refills | Status: DC

## 2025-01-03 MED ORDER — MIDODRINE HCL 10 MG PO TABS: 10.0000 mg | ORAL_TABLET | Freq: Three times a day (TID) | ORAL | 3 refills | Status: AC

## 2025-01-03 MED ORDER — GLUCERNA PO LIQD: 237.0000 mL | Freq: Two times a day (BID) | ORAL | 4 refills | Status: AC

## 2025-01-03 NOTE — Discharge Summary (Addendum)
 "                                                                                 [name] White, is a 84 y.o. female  DOB 1941-03-16  MRN [id number].  Admission date:  01/02/2025  Admitting Physician  Tully FORBES Carwin, MD  Discharge Date:  01/03/2025   Primary MD  [name], MD  Recommendations for primary care physician for things to follow:  1)Short acting insulin  (Novolog )  per sliding scale 0-10 ---:-- Insulin  injection 0-10 Units 0-10 Units Subcutaneous, 3 times daily with meals CBG 70 - 120: 0 unit CBG 121 - 150: 0 unit  CBG 151 - 200: 0 unit CBG 201 - 250: 2 units CBG 251 - 300: 4 units CBG 301 - 350: 6 units  CBG 351 - 400: 8 units  CBG > 400: 10 units  2)Avoid Dehydration--- encourage adequate oral intake including Glucerna supplements  3)Repeat BMP in about 1 week  Admission Diagnosis  SIRS (systemic inflammatory response syndrome) (HCC) [R65.10] Acute sepsis (HCC) [A41.9]   Discharge Diagnosis  SIRS (systemic inflammatory response syndrome) (HCC) [R65.10] Acute sepsis (HCC) [A41.9]    Principal Problem:   SIRS (systemic inflammatory response syndrome) (HCC) Active Problems:   Influenza A   Hypothyroidism   Essential hypertension   Type 2 diabetes mellitus with peripheral neuropathy (HCC)   Hypotension      Past Medical History:  Diagnosis Date   Anemia    Anxiety    Asthma    ASYMPTOMATIC POSTMENOPAUSAL STATUS 02/09/2009   Cataract    Cerebral infarction (HCC)    Constipation 03/19/2013   Depression    DIABETES MELLITUS, TYPE II 09/14/2007   HYPERCHOLESTEROLEMIA 02/09/2009   HYPERTENSION 02/09/2009   HYPOTHYROIDISM 09/14/2007   Metabolic encephalopathy    MIGRAINE HEADACHE 09/14/2007   Neuropathy    OSTEOPOROSIS 02/09/2009   PANCREATITIS 09/14/2007   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   PONV (postoperative nausea and vomiting)    Protein calorie malnutrition    Rectocele 03/19/2013   Shortness of breath    SUPERFICIAL PHLEBITIS 09/14/2007   Varicose  veins    Past Surgical History:  Procedure Laterality Date   ABDOMINAL HYSTERECTOMY     ANTERIOR AND POSTERIOR REPAIR N/A 04/02/2013   Procedure: ANTERIOR (CYSTOCELE) AND POSTERIOR REPAIR (RECTOCELE);  Surgeon: Norleen LULLA Server, MD;  Location: [facility];  Service: Gynecology;  Laterality: N/A;   APPENDECTOMY     BRAIN SURGERY     CATARACT EXTRACTION W/PHACO  12/05/2011   Procedure: CATARACT EXTRACTION PHACO AND INTRAOCULAR LENS PLACEMENT (IOC);  Surgeon: Cherene Mania;  Location: [facility];  Service: Ophthalmology;  Laterality: Left;  CDE:9.65   CHOLECYSTECTOMY     CRANIOTOMY N/A 06/04/2020   Procedure: Endoscopic Transphenoidal Resection of Recurrent PituitaryTumor;  Surgeon: Colon Shove, MD;  Location: [facility];  Service: Neurosurgery;  Laterality: N/A;   CYSTOSCOPY     ENDOVENOUS ABLATION SAPHENOUS VEIN W/ LASER  11-03-2011   right greater saphenous vein   left leg done 10-2011   EYE SURGERY  98   right cataract extraction 98   LAPAROSCOPIC NISSEN FUNDOPLICATION     NM ESOPHAGEAL REFLUX  08-11-11  PITUITARY EXCISION  10/1997   POSTERIOR REPAIR     TRANSNASAL APPROACH N/A 06/04/2020   Procedure: TRANSNASAL APPROACH;  Surgeon: Mable Lenis, MD;  [facility];  Service: ENT;  Laterality: N/A;   TRANSPHENOIDAL / TRANSNASAL HYPOPHYSECTOMY / RESECTION PITUITARY TUMOR  08-11-11     HPI  from the history and physical done on the day of admission:   Patient coming from: [facility]   I have personally briefly reviewed patient's old medical records in [facility]   Chief Complaint: AMS, SOB, Cough   HPI: [name] White is a 84 y.o. female with medical history significant for COPD, diabetes mellitus, hypertension, hypothyroidism. Patient presented to the ED from pain center with reports of altered mental status of 2 days, hypertension, difficulty breathing and cough.  O2 sats were low so she was placed on 3 L. Daughter reports yesterday when she saw patient, patient was not responding,  but today, she did a 180, when they visited her she was sitting in a chair, dressed, and awake.  Has had poor oral intake over the past few days.  Trintellix  was just discontinued - ~ 2 days ago, as it was thought to be contributing to her lethargy.   Blood work was drawn at nursing home, lactic acid 2.8, WBC 20, was also noted to be wheezing/rhonchorous.  Patient was diagnosed with sepsis, started on IV Rocephin , normal saline.   Recently hospitalized 12/28 to 12/31-for acute metabolic encephalopathy, acute COPD exacerbation, hypoxia in the setting of influenza infection.  Treated with steroids, Tamiflu .  Daughter reports patient's cough is new and started after the influenza infection.   ED Course: Tmax 97.8.  Heart rate 87-105, respiratory rate 18-25.  Blood pressure systolic down to 10/42 improved to 130s.  On my evaluation, O2 sats > 96% on room air. WBC 20.5. Serum bicarb low at 16, anion gap of 13, anion gap of 21. Creatinine 1.13 Sodium 131. Portable chest x-ray without acute abnormality. UA not suggestive of UTI Blood cultures obtained. IV ceftriaxone  started.   Review of Systems: As per HPI all other systems reviewed and negative.    Hospital Course:   Assessment and Plan: 1)SIRS with Hypotension--  Recent flu diagnosis 12/28. -Blood pressure and heart rate improved with IV fluids -CT chest without contrast was done-diffuse bronchial wall thickening with bibasilar atelectasis -WBC 20.5 >>15.3 ---in the setting of recent steroid therapy full influenza induced COPD exacerbation -UA and chest imaging studies without definite evidence of bacterial infection -Blood cultures NGTD - Received IV Rocephin  in the ED -No further antibiotics or steroids indicated at this time -Suspect soft BP was due to poor oral intake in the setting of recent influenza infection with poor appetite -PTA patient was already on midodrine  5 mg 3 times daily , will increase midodrine  to 10 mg 3 times  daily --Need to encourage patient to have increased oral intake and avoid dehydration -Decrease Lasix  to Monday Wednesday Friday from daily dosing due to concerns about dehydration in the setting of poor oral intake in a patient who is still recovering from recent influenza infection with poor appetite   2)Lactic acidosis of 2.8, may be 2/2 dehydration/hypotension in the setting of poor oral intake and Lasix  use, mucous membranes very dry on admission --Patient received IV fluids -Avoid dehydration as outlined above #1   3)bronchitis/COPD-- recent influenza A infection with hypoxia during recent hospitalization 12/28 to 12/31, completed course of Tamiflu .   --Respiratory symptoms improved significantly with bronchodilators  and steroids --No hypoxia at this time - Hold off on further antibiotics at this time - Okay to complete prednisone  as prescribed  4)acute metabolic encephalopathy-significantly improved today per family.  In setting of dehydration, SIRs.  Trintellix  was just discontinued due to concern it was causing lethargy.  Head CT 12/22/2024-no acute abnormality. - Family insist that patient's psychiatric medications including Trintellix , Effexor  and Lyrica  be continued even though these may be contributing to altered mentation and occasional episodes of lethargy -- Okay to Stop as needed Zanaflex  and trazodone  -- Patient's daughter Mrs. [name], at bedside and states that patient is back to her baseline from a mentation/cognitive standpoint at this time  5)chronic anemia/B12 deficiency-- hemoglobin 11.0 which is close to baseline -Recommend outpatient B12/B complex vitamin supplementation   6)Generalized weakness and deconditioning--- physical therapist recently recommended during recent hospitalization SNF rehab - Okay to transfer back to SNF rehab for short-term rehab   7)chronic pain/neuropathy--- continue Lyrica    8)History of prior strokes--no new strokelike symptoms at  this time  -continue aspirin  and atorvastatin  for secondary stroke prevention   8)Depression/history of vascular dementia--  -- Continue Effexor  , BuSpar  and Trintellix  -Mentation is back to baseline at this time -Family insist that patient's psychiatric medications including Trintellix , Effexor  and Lyrica  be continued even though these may be contributing to altered mentation and occasional episodes of lethargy  9)GERD--continue omeprazole  especially while on steroids   10)Hypothyroidism - Continue Synthroid    11)Diabetes mellitus--A1c 6.5 reflecting good diabetic control PTA - Reduce metformin  to 500 mg twice daily - Insulin  regimen as ordered including sliding scale  Discharge Condition: stable  Follow UP--PCP as ordered  Diet and Activity recommendation:  As advised  Discharge Instructions    Discharge Instructions     Call MD for:  difficulty breathing, headache or visual disturbances   Complete by: As directed    Call MD for:  persistant dizziness or light-headedness   Complete by: As directed    Call MD for:  persistant nausea and vomiting   Complete by: As directed    Call MD for:  temperature >100.4   Complete by: As directed    Discharge instructions   Complete by: As directed    1)Short acting insulin  (Novolog )  per sliding scale 0-10 ---:-- Insulin  injection 0-10 Units 0-10 Units Subcutaneous, 3 times daily with meals CBG 70 - 120: 0 unit CBG 121 - 150: 0 unit  CBG 151 - 200: 0 unit CBG 201 - 250: 2 units CBG 251 - 300: 4 units CBG 301 - 350: 6 units  CBG 351 - 400: 8 units  CBG > 400: 10 units  2)Avoid Dehydration--- encourage adequate oral intake including Glucerna supplements  3)Repeat BMP in about 1 week   Increase activity slowly   Complete by: As directed    No wound care   Complete by: As directed         Discharge Medications     Allergies as of 01/03/2025       Reactions   Betadine [povidone Iodine] Hives   Povidone-iodine Hives    Penicillins Rash, Dermatitis        Medication List     STOP taking these medications    cefTRIAXone  IVPB Commonly known as: ROCEPHIN    insulin  aspart 100 UNIT/ML injection Commonly known as: novoLOG    nitrofurantoin  50 MG capsule Commonly known as: Macrodantin    sodium chloride  0.9 % Soln   tiZANidine  2 MG tablet Commonly known as: ZANAFLEX   traZODone  50 MG tablet Commonly known as: DESYREL        TAKE these medications    acetaminophen  325 MG tablet Commonly known as: TYLENOL  Take 2 tablets (650 mg total) by mouth every 6 (six) hours as needed for mild pain (pain score 1-3) (or Fever >/= 101).   albuterol  108 (90 Base) MCG/ACT inhaler Commonly known as: VENTOLIN  HFA Inhale 2 puffs into the lungs every 4 (four) hours as needed for wheezing or shortness of breath. What changed: Another medication with the same name was added. Make sure you understand how and when to take each.   albuterol  (2.5 MG/3ML) 0.083% nebulizer solution Commonly known as: PROVENTIL  Take 3 mLs (2.5 mg total) by nebulization every 4 (four) hours as needed for wheezing or shortness of breath. What changed: You were already taking a medication with the same name, and this prescription was added. Make sure you understand how and when to take each.   aspirin  EC 81 MG tablet Take 1 tablet (81 mg total) by mouth every morning. Swallow whole.   atorvastatin  80 MG tablet Commonly known as: LIPITOR Take 80 mg by mouth every morning.   busPIRone  5 MG tablet Commonly known as: BUSPAR  Take 5 mg by mouth 3 (three) times daily as needed (anxiety).   CENTRUM SILVER PO Take 1 tablet by mouth every morning.   cyanocobalamin  1000 MCG/ML injection Commonly known as: VITAMIN B12 Inject 1,000 mcg into the muscle every 30 (thirty) days. Given on the 23rd of each month   cycloSPORINE  0.05 % ophthalmic emulsion Commonly known as: RESTASIS  Place 1 drop into both eyes 2 (two) times daily. Given at 0900  and 2100   fluticasone  furoate-vilanterol 200-25 MCG/ACT Aepb Commonly known as: Breo Ellipta  Inhale 1 puff into the lungs daily.   furosemide  20 MG tablet Commonly known as: Lasix  Take 1 tablet (20 mg total) by mouth every Monday, Wednesday, and Friday. What changed: when to take this   Glucerna Liqd Take 237 mLs by mouth 2 (two) times daily.   feeding supplement (GLUCERNA SHAKE) Liqd Take 237 mLs by mouth 3 (three) times daily between meals.   Imvexxy  Maintenance Pack 10 MCG Inst Generic drug: Estradiol  Place 1 suppository vaginally 2 (two) times a week. Mondays and Thursdays   insulin  glargine 100 UNIT/ML injection Commonly known as: LANTUS  Inject 10 Units into the skin at bedtime.   insulin  regular 100 units/mL injection Commonly known as: HumuLIN R  Inject 0-0.1 mLs (0-10 Units total) into the skin 3 (three) times daily before meals. Short acting insulin  per sliding scale 0-10 ---:-- Insulin  injection 0-10 Units 0-10 Units Subcutaneous, 3 times daily with meals CBG 70 - 120: 0 unit CBG 121 - 150: 0 unit  CBG 151 - 200: 0 unit CBG 201 - 250: 2 units CBG 251 - 300: 4 units CBG 301 - 350: 6 units  CBG 351 - 400: 8 units  CBG > 400: 10 units   ipratropium-albuterol  0.5-2.5 (3) MG/3ML Soln Commonly known as: DUONEB Take 3 mLs by nebulization 3 (three) times daily. Given at 0900, 1200 and 2100 What changed: Another medication with the same name was removed. Continue taking this medication, and follow the directions you see here.   levothyroxine  50 MCG tablet Commonly known as: SYNTHROID  Take 50 mcg by mouth daily before breakfast.   metFORMIN  850 MG tablet Commonly known as: GLUCOPHAGE  Take 1 tablet (850 mg total) by mouth 2 (two) times daily with a meal. What changed:  medication strength  how much to take when to take this additional instructions   midodrine  10 MG tablet Commonly known as: PROAMATINE  Take 1 tablet (10 mg total) by mouth 3 (three) times daily. What  changed:  medication strength how much to take when to take this additional instructions   omeprazole  20 MG capsule Commonly known as: PRILOSEC Take 1 capsule (20 mg total) by mouth 2 (two) times daily before a meal.   polyethylene glycol 17 g packet Commonly known as: MIRALAX  / GLYCOLAX  Take 17 g by mouth daily as needed for mild constipation.   potassium chloride  10 MEQ tablet Commonly known as: KLOR-CON  Take 10 mEq by mouth every morning.   predniSONE  20 MG tablet Commonly known as: DELTASONE  Take 2 tablets (40 mg total) by mouth daily with breakfast for 5 days.   pregabalin  150 MG capsule Commonly known as: LYRICA  Take 1 capsule (150 mg total) by mouth 2 (two) times daily. Given at 0900 and 2100   rizatriptan  10 MG tablet Commonly known as: MAXALT  Take 10 mg by mouth daily as needed for migraine.   Trintellix  10 MG Tabs tablet Generic drug: vortioxetine  HBr Take 10 mg by mouth every morning.   umeclidinium bromide  62.5 MCG/ACT Aepb Commonly known as: INCRUSE ELLIPTA  Inhale 1 puff into the lungs daily.   venlafaxine  XR 150 MG 24 hr capsule Commonly known as: EFFEXOR -XR Take 150 mg by mouth every morning.        Major procedures and Radiology Reports - PLEASE review detailed and final reports for all details, in brief -    CT CHEST WO CONTRAST Result Date: 01/02/2025 EXAM: CT CHEST WITHOUT CONTRAST 01/02/2025 07:51:06 PM TECHNIQUE: CT of the chest was performed without the administration of intravenous contrast. Multiplanar reformatted images are provided for review. Automated exposure control, iterative reconstruction, and/or weight based adjustment of the mA/kV was utilized to reduce the radiation dose to as low as reasonably achievable. COMPARISON: 11/08/2024 CLINICAL HISTORY: SIRS, cough, dyspnea, leukocytosis, lactic acidosis. FINDINGS: MEDIASTINUM: Cardiomegaly. Coronary artery and aortic atherosclerosis. The central airways are clear. LYMPH NODES: No  mediastinal, hilar or axillary lymphadenopathy. LUNGS AND PLEURA: Diffuse bronchial wall thickening. Bibasilar atelectasis. No focal consolidation or pulmonary edema. No pleural effusion or pneumothorax. SOFT TISSUES/BONES: No acute abnormality of the bones or soft tissues. UPPER ABDOMEN: Limited images of the upper abdomen demonstrates no acute abnormality. IMPRESSION: 1. Diffuse bronchial wall thickening with bibasilar atelectasis. 2. Cardiomegaly. Electronically signed by: Franky Crease MD MD 01/02/2025 07:56 PM EST RP Workstation: [id number]   DG Chest Port 1 View Result Date: 01/02/2025 CLINICAL DATA:  Hypoxia with altered mental status x2 days. EXAM: PORTABLE CHEST 1 VIEW COMPARISON:  None Available. FINDINGS: The heart size and mediastinal contours are within normal limits. There is mild to moderate severity calcification of the aortic arch and tortuosity of the descending thoracic aorta. Low lung volumes are noted. No acute infiltrate, pleural effusion or pneumothorax is identified. Radiopaque surgical clips are seen within the right upper quadrant. Multilevel degenerative changes are noted throughout the thoracic spine. IMPRESSION: Low lung volumes without evidence of acute or active cardiopulmonary disease. Electronically Signed   By: Suzen Dials M.D.   On: 01/02/2025 17:02   DG Chest Portable 1 View Result Date: 12/27/2024 EXAM: 1 VIEW(S) XRAY OF THE CHEST 12/27/2024 10:00:04 PM COMPARISON: Comparison with 12/22/2024. CLINICAL HISTORY: hyperglycemia hyperglycemia FINDINGS: LUNGS AND PLEURA: Shallow inspiration. Pulmonary vascularity is normal. Peribronchial thickening with streaky perihilar opacities likely representing chronic bronchitis. No focal consolidation. No  pleural effusion. No pneumothorax. HEART AND MEDIASTINUM: Heart size is normal. Mediastinal contours appear intact. Calcification of the aorta. BONES AND SOFT TISSUES: No acute osseous abnormality. IMPRESSION: 1. No acute findings.  Electronically signed by: Elsie Gravely MD 12/27/2024 10:59 PM EST RP Workstation: [id number]   CT Head Wo Contrast Result Date: 12/22/2024 EXAM: CT HEAD WITHOUT CONTRAST 12/22/2024 09:27:18 AM TECHNIQUE: CT of the head was performed without the administration of intravenous contrast. Automated exposure control, iterative reconstruction, and/or weight based adjustment of the mA/kV was utilized to reduce the radiation dose to as low as reasonably achievable. COMPARISON: Recent brain MRI 12/10/2024 and earlier studies. CLINICAL HISTORY: 84 year old female with altered mental status and increased confusion. FINDINGS: BRAIN AND VENTRICLES: No acute hemorrhage. No evidence of acute infarct. No hydrocephalus. No extra-axial collection. No mass effect or midline shift. Advanced chronic cerebral white matter disease with scattered patchy and confluent hypodensity is stable. Chronic bilateral cerebellar infarcts right greater than left. Stable gray white differentiation otherwise. No suspicious intracranial vascular hyperdensity. Chronic skull base invasive pituitary adenoma, stable appearance of invasion of the sphenoid sinus and central clivus by CT (series 4 images 28 and 29). Suprasellar component of the mass appears stable partially effacing the suprasellar cistern and obscuring the optic chiasm on coronal image 307. Calcified atherosclerosis at the skull base. No suspicious intracranial vascular hyperdensity. ORBITS: No acute abnormality. SINUSES: Increased ethmoid and frontal sinus mucosal thickening which appears inflammatory. Chronic postoperative changes to the posterior nasal cavity. Tympanic cavities and mastoids remain well aerated. SOFT TISSUES AND SKULL: No acute soft tissue abnormality. No skull fracture. IMPRESSION: 1. Stable non-contrast CT appearance of chronic skull base invasive pituitary macro-adenoma, chronic brain ischemic disease. 2. Mild new paranasal sinus inflammation. 3. No acute  intracranial abnormality. Electronically signed by: Helayne Hurst MD 12/22/2024 10:05 AM EST RP Workstation: [id number]   DG Chest Port 1 View Result Date: 12/22/2024 CLINICAL DATA:  Shortness of breath. Confusion. Hypotension. Suspected sepsis. EXAM: PORTABLE CHEST 1 VIEW COMPARISON:  12/10/2024 FINDINGS: Heart size remains within normal limits. Low lung volumes again noted. New pleural based opacity seen along the left lateral chest wall, suspicious for small layering left pleural effusion. Lungs are otherwise clear. IMPRESSION: Probable small layering left pleural effusion. Electronically Signed   By: Norleen DELENA Kil M.D.   On: 12/22/2024 08:48   MR BRAIN WO CONTRAST Result Date: 12/10/2024 EXAM: MRI BRAIN WITHOUT CONTRAST 12/10/2024 05:50:24 PM TECHNIQUE: Multiplanar multisequence MRI of the head/brain was performed without the administration of intravenous contrast. COMPARISON: Comparison made with CT from earlier the same day, as well as prior exam from 10/25/2024. CLINICAL HISTORY: Mental status change, unknown cause. FINDINGS: BRAIN AND VENTRICLES: No acute infarct. No intracranial hemorrhage. No mass. No midline shift. No hydrocephalus. Generalized age-related cerebral atrophy. Patchy and confluent T2/FLAIR hyperintensity involving the periventricular and deep white matter of the hemispheres, consistent with chronic small vessel ischemic disease, moderate in nature. Multiple remote lacunar infarcts present about the hemispheric cerebral white matter. Scattered chronic remote bilateral cerebellar infarcts noted. Previously identified pituitary macroadenoma measures up to approximately 2.4 cm, relatively stable from prior. Normal flow voids. ORBITS: Prior bilateral ocular lens replacement. SINUSES AND MASTOIDS: No acute abnormality. BONES AND SOFT TISSUES: Normal marrow signal. No acute soft tissue abnormality. IMPRESSION: 1. No acute intracranial abnormality. 2. Moderate chronic small vessel ischemic  disease with multiple remote lacunar infarcts about the hemispheric cerebral white matter and cerebellum. 3. 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 Electronically signed by:  Morene Hoard MD 12/10/2024 06:34 PM EST RP Workstation: [id number]   CT Head Wo Contrast Result Date: 12/10/2024 EXAM: CT HEAD WITHOUT CONTRAST 12/10/2024 03:32:44 PM TECHNIQUE: CT of the head was performed without the administration of intravenous contrast. Automated exposure control, iterative reconstruction, and/or weight based adjustment of the mA/kV was utilized to reduce the radiation dose to as low as reasonably achievable. COMPARISON: 10/12/2024 CLINICAL HISTORY: Altered mental status, nontraumatic (Ped 0-17y) FINDINGS: BRAIN AND VENTRICLES: No acute hemorrhage. No evidence of acute infarct. Global parenchymal atrophy with prominent CSF spaces and sulci. Periventricular and subcortical white matter patchy hypodensities, consistent with chronic small-vessel ischemic changes. Old right cerebellar infarct. Additional small remote left cerebellar infarct. Postsurgical changes of prior transsphenoidal resection of pituitary tumor. There is residual slightly hyperattenuating soft tissue along the superior aspect of the sella extending to the suprasellar cistern, better evaluated on recent prior MRI. Postsurgical changes of the sella. No hydrocephalus. No extra-axial collection. No mass effect or midline shift. ORBITS: Bilateral lens replacement. Vascular calcifications. No acute abnormality. SINUSES: No acute abnormality. SOFT TISSUES AND SKULL: No acute soft tissue abnormality. No skull fracture. IMPRESSION: 1. No acute intracranial abnormality. 2. Global parenchymal atrophy, chronic small-vessel ischemic changes, and remote cerebellar infarcts. 3. Postsurgical changes of transsphenoidal resection of pituitary tumor with residual mass in the sella and suprasellar cistern, better evaluated on recent  prior MRI. Electronically signed by: Donnice Mania MD 12/10/2024 04:01 PM EST RP Workstation: [id number]   DG Chest Portable 1 View Result Date: 12/10/2024 CLINICAL DATA:  Altered mental status EXAM: PORTABLE CHEST 1 VIEW COMPARISON:  November 08, 2024 FINDINGS: Stable cardiomediastinal silhouette. No definite acute pulmonary abnormality seen. The visualized skeletal structures are unremarkable. IMPRESSION: No active disease. Electronically Signed   By: Lynwood Landy Raddle M.D.   On: 12/10/2024 14:26    Micro Results   Recent Results (from the past 240 hours)  Resp panel by RT-PCR (RSV, Flu A&B, Covid) Anterior Nasal Swab     Status: Abnormal   Collection Time: 12/27/24 10:09 PM   Specimen: Anterior Nasal Swab  Result Value Ref Range Status   SARS Coronavirus 2 by RT PCR NEGATIVE NEGATIVE Final    Comment: (NOTE) SARS-CoV-2 target nucleic acids are NOT DETECTED.  The SARS-CoV-2 RNA is generally detectable in upper respiratory specimens during the acute phase of infection. The lowest concentration of SARS-CoV-2 viral copies this assay can detect is 138 copies/mL. A negative result does not preclude SARS-Cov-2 infection and should not be used as the sole basis for treatment or other patient management decisions. A negative result may occur with  improper specimen collection/handling, submission of specimen other than nasopharyngeal swab, presence of viral mutation(s) within the areas targeted by this assay, and inadequate number of viral copies(<138 copies/mL). A negative result must be combined with clinical observations, patient history, and epidemiological information. The expected result is Negative.  Fact Sheet for Patients:  bloggercourse.com  Fact Sheet for Healthcare Providers:  seriousbroker.it  This test is no t yet approved or cleared by the [place]  FDA and  has been authorized for detection and/or diagnosis of  SARS-CoV-2 by FDA under an Emergency Use Authorization (EUA). This EUA will remain  in effect (meaning this test can be used) for the duration of the COVID-19 declaration under Section 564(b)(1) of the Act, 21 U.S.C.section 360bbb-3(b)(1), unless the authorization is terminated  or revoked sooner.       Influenza A by PCR POSITIVE (A) NEGATIVE Final   Influenza B  by PCR NEGATIVE NEGATIVE Final    Comment: (NOTE) The Xpert Xpress SARS-CoV-2/FLU/RSV plus assay is intended as an aid in the diagnosis of influenza from Nasopharyngeal swab specimens and should not be used as a sole basis for treatment. Nasal washings and aspirates are unacceptable for Xpert Xpress SARS-CoV-2/FLU/RSV testing.  Fact Sheet for Patients: bloggercourse.com  Fact Sheet for Healthcare Providers: seriousbroker.it  This test is not yet approved or cleared by the [place]  FDA and has been authorized for detection and/or diagnosis of SARS-CoV-2 by FDA under an Emergency Use Authorization (EUA). This EUA will remain in effect (meaning this test can be used) for the duration of the COVID-19 declaration under Section 564(b)(1) of the Act, 21 U.S.C. section 360bbb-3(b)(1), unless the authorization is terminated or revoked.     Resp Syncytial Virus by PCR NEGATIVE NEGATIVE Final    Comment: (NOTE) Fact Sheet for Patients: bloggercourse.com  Fact Sheet for Healthcare Providers: seriousbroker.it  This test is not yet approved or cleared by the [place]  FDA and has been authorized for detection and/or diagnosis of SARS-CoV-2 by FDA under an Emergency Use Authorization (EUA). This EUA will remain in effect (meaning this test can be used) for the duration of the COVID-19 declaration under Section 564(b)(1) of the Act, 21 U.S.C. section 360bbb-3(b)(1), unless the authorization is terminated  or revoked.  Performed at [facility], [address], [place], [place] [address]   Blood culture (routine x 2)     Status: None (Preliminary result)   Collection Time: 01/02/25  3:53 PM   Specimen: BLOOD  Result Value Ref Range Status   Specimen Description BLOOD LEFT ASSIST CONTROL  Final   Special Requests   Final    BOTTLES DRAWN AEROBIC AND ANAEROBIC Blood Culture adequate volume   Culture   Final    NO GROWTH < 24 HOURS Performed at [facility], [address] [place], [place] [address]    Report Status PENDING  Incomplete  Blood culture (routine x 2)     Status: None (Preliminary result)   Collection Time: 01/02/25  4:16 PM   Specimen: BLOOD  Result Value Ref Range Status   Specimen Description BLOOD RIGHT ANTECUBITAL  Final   Special Requests   Final    BOTTLES DRAWN AEROBIC AND ANAEROBIC Blood Culture results may not be optimal due to an inadequate volume of blood received in culture bottles   Culture   Final    NO GROWTH < 24 HOURS Performed at [facility], [address], [place], [place] [address]    Report Status PENDING  Incomplete    Today   Subjective    [name] White today has no new complaints No fever  Or chills   No Nausea, Vomiting or Diarrhea       -- Patient's daughter Mrs. [name] is here at bedside and states that patient is back to her baseline from a mentation/cognitive standpoint at this time   Patient has been seen and examined prior to discharge   Objective   Blood pressure (!) 96/58, pulse 80, temperature 97.6 F (36.4 C), temperature source Oral, resp. rate 14, height 5' 6 (1.676 m), weight 54.5 kg, SpO2 97%.   Intake/Output Summary (Last 24 hours) at 01/03/2025 1257 Last data filed at 01/03/2025 0600 Gross per 24 hour  Intake 1742.63 ml  Output 200 ml  Net 1542.63 ml    Exam Gen:- Awake Alert, no acute distress, no conversational dyspnea HEENT:- [place].AT, No sclera icterus, moist  oral mucosa Neck-Supple Neck,No JVD,.   Lungs-  CTAB , good air movement bilaterally, respiration is Not labored CV- S1, S2 normal, regular Abd-  +ve B.Sounds, Abd Soft, No tenderness,    Extremity/Skin:- No  edema,   good pulses Psych-affect is appropriate, oriented x3, at baseline patient has some cognitive and memory deficits, she is interacting well today and able to answer simple questions Neuro-no new focal deficits, no tremors    Data Review   CBC w Diff:  Lab Results  Component Value Date   WBC 15.3 (H) 01/03/2025   HGB 11.0 (L) 01/03/2025   HCT 34.2 (L) 01/03/2025   PLT 244 01/03/2025   LYMPHOPCT 14 01/02/2025   MONOPCT 8 01/02/2025   EOSPCT 2 01/02/2025   BASOPCT 0 01/02/2025    CMP:  Lab Results  Component Value Date   NA 136 01/03/2025   K 4.1 01/03/2025   CL 102 01/03/2025   CO2 22 01/03/2025   BUN 22 01/03/2025   CREATININE 0.74 01/03/2025   CREATININE 0.72 10/03/2016   PROT 7.1 01/02/2025   ALBUMIN 3.5 01/02/2025   BILITOT 1.1 01/02/2025   ALKPHOS 61 01/02/2025   AST 34 01/02/2025   ALT 29 01/02/2025  .  Total Discharge time is about 33 minutes  Rendall Carwin M.D on 01/03/2025 at 12:57 PM  Go to [email or web address] -  for contact info  [facility] - Office  [phone]   "

## 2025-01-03 NOTE — TOC Transition Note (Signed)
 Transition of Care [facility]) - Discharge Note   Patient Details  Name: [name] White MRN: [id number] Date of Birth: 27-Sep-1941  [facility]) CM/SW Contact:  Hoy DELENA Bigness, LCSW Phone Number: 01/03/2025, 12:07 PM   Clinical Narrative:    Pt to discharge to return to [facility] for STR. Pt going to room 126. RN to call report to [phone]. Pt's daughter aware of discharge plans. Pt to be transported by staff via tunnel to [facility].    Final next level of care: Skilled Nursing Facility     Patient Goals and CMS Choice            Discharge Placement   Existing PASRR number confirmed : 01/03/25          Patient chooses bed at: [facility] Patient to be transferred to facility by: Staff Name of family member notified: Daughter, Olam Patient and family notified of of transfer: 01/03/25  Discharge Plan and Services Additional resources added to the After Visit Summary for                                       Social Drivers of Health (SDOH) Interventions SDOH Screenings   Food Insecurity: Patient Unable To Answer (01/03/2025)  Housing: Unknown (01/03/2025)  Transportation Needs: Patient Unable To Answer (01/03/2025)  Utilities: Patient Unable To Answer (01/03/2025)  Depression (PHQ2-9): Low Risk (10/28/2024)  Social Connections: Patient Unable To Answer (01/03/2025)  Recent Concern: Social Connections - Socially Isolated (11/08/2024)  Tobacco Use: Low Risk (01/02/2025)     Readmission Risk Interventions    01/03/2025   12:03 PM 01/03/2025   10:57 AM 12/23/2024    8:42 AM  Readmission Risk Prevention Plan  Transportation Screening Complete Complete Complete  Medication Review (RN Care Manager) Complete  Complete  PCP or Specialist appointment within 3-5 days of discharge Complete    HRI or Home Care Consult Complete  Complete  SW Recovery Care/Counseling Consult Complete  Complete  Palliative Care Screening Not Applicable  Not Applicable  Skilled Nursing  Facility Complete  Complete

## 2025-01-03 NOTE — Plan of Care (Signed)
 " Problem: Education: Goal: Knowledge of General Education information will improve Description: Including pain rating scale, medication(s)/side effects and non-pharmacologic comfort measures 01/03/2025 1530 by Elner Ames MATSU, RN Outcome: Adequate for Discharge 01/03/2025 1529 by Elner Ames MATSU, RN Outcome: Adequate for Discharge   Problem: Health Behavior/Discharge Planning: Goal: Ability to manage health-related needs will improve 01/03/2025 1530 by Elner Ames MATSU, RN Outcome: Adequate for Discharge 01/03/2025 1529 by Elner Ames MATSU, RN Outcome: Adequate for Discharge   Problem: Clinical Measurements: Goal: Ability to maintain clinical measurements within normal limits will improve 01/03/2025 1530 by Elner Ames MATSU, RN Outcome: Adequate for Discharge 01/03/2025 1529 by Elner Ames MATSU, RN Outcome: Adequate for Discharge Goal: Will remain free from infection 01/03/2025 1530 by Elner Ames MATSU, RN Outcome: Adequate for Discharge 01/03/2025 1529 by Elner Ames MATSU, RN Outcome: Adequate for Discharge Goal: Diagnostic test results will improve 01/03/2025 1530 by Elner Ames MATSU, RN Outcome: Adequate for Discharge 01/03/2025 1529 by Elner Ames MATSU, RN Outcome: Adequate for Discharge Goal: Respiratory complications will improve 01/03/2025 1530 by Elner Ames MATSU, RN Outcome: Adequate for Discharge 01/03/2025 1529 by Elner Ames MATSU, RN Outcome: Adequate for Discharge Goal: Cardiovascular complication will be avoided 01/03/2025 1530 by Elner Ames MATSU, RN Outcome: Adequate for Discharge 01/03/2025 1529 by Elner Ames MATSU, RN Outcome: Adequate for Discharge   Problem: Activity: Goal: Risk for activity intolerance will decrease 01/03/2025 1530 by Elner Ames MATSU, RN Outcome: Adequate for Discharge 01/03/2025 1529 by Elner Ames MATSU, RN Outcome: Adequate for Discharge   Problem: Nutrition: Goal: Adequate nutrition will be maintained 01/03/2025 1530 by Elner Ames MATSU,  RN Outcome: Adequate for Discharge 01/03/2025 1529 by Elner Ames MATSU, RN Outcome: Adequate for Discharge   Problem: Coping: Goal: Level of anxiety will decrease 01/03/2025 1530 by Elner Ames MATSU, RN Outcome: Adequate for Discharge 01/03/2025 1529 by Elner Ames MATSU, RN Outcome: Adequate for Discharge   Problem: Elimination: Goal: Will not experience complications related to bowel motility 01/03/2025 1530 by Elner Ames MATSU, RN Outcome: Adequate for Discharge 01/03/2025 1529 by Elner Ames MATSU, RN Outcome: Adequate for Discharge Goal: Will not experience complications related to urinary retention 01/03/2025 1530 by Elner Ames MATSU, RN Outcome: Adequate for Discharge 01/03/2025 1529 by Elner Ames MATSU, RN Outcome: Adequate for Discharge   Problem: Pain Managment: Goal: General experience of comfort will improve and/or be controlled 01/03/2025 1530 by Elner Ames MATSU, RN Outcome: Adequate for Discharge 01/03/2025 1529 by Elner Ames MATSU, RN Outcome: Adequate for Discharge   Problem: Safety: Goal: Ability to remain free from injury will improve 01/03/2025 1530 by Elner Ames MATSU, RN Outcome: Adequate for Discharge 01/03/2025 1529 by Elner Ames MATSU, RN Outcome: Adequate for Discharge   Problem: Skin Integrity: Goal: Risk for impaired skin integrity will decrease 01/03/2025 1530 by Elner Ames MATSU, RN Outcome: Adequate for Discharge 01/03/2025 1529 by Elner Ames MATSU, RN Outcome: Adequate for Discharge   Problem: Education: Goal: Ability to describe self-care measures that may prevent or decrease complications (Diabetes Survival Skills Education) will improve 01/03/2025 1530 by Elner Ames MATSU, RN Outcome: Adequate for Discharge 01/03/2025 1529 by Elner Ames MATSU, RN Outcome: Adequate for Discharge Goal: Individualized Educational Video(s) 01/03/2025 1530 by Elner Ames MATSU, RN Outcome: Adequate for Discharge 01/03/2025 1529 by Elner Ames MATSU, RN Outcome: Adequate  for Discharge   Problem: Coping: Goal: Ability to adjust to condition or change in health will improve 01/03/2025 1530 by Elner Ames MATSU, RN Outcome: Adequate for Discharge 01/03/2025 1529  by Elner Ames MATSU, RN Outcome: Adequate for Discharge   Problem: Fluid Volume: Goal: Ability to maintain a balanced intake and output will improve 01/03/2025 1530 by Elner Ames MATSU, RN Outcome: Adequate for Discharge 01/03/2025 1529 by Elner Ames MATSU, RN Outcome: Adequate for Discharge   Problem: Health Behavior/Discharge Planning: Goal: Ability to identify and utilize available resources and services will improve 01/03/2025 1530 by Elner Ames MATSU, RN Outcome: Adequate for Discharge 01/03/2025 1529 by Elner Ames MATSU, RN Outcome: Adequate for Discharge Goal: Ability to manage health-related needs will improve 01/03/2025 1530 by Elner Ames MATSU, RN Outcome: Adequate for Discharge 01/03/2025 1529 by Elner Ames MATSU, RN Outcome: Adequate for Discharge   Problem: Metabolic: Goal: Ability to maintain appropriate glucose levels will improve 01/03/2025 1530 by Elner Ames MATSU, RN Outcome: Adequate for Discharge 01/03/2025 1529 by Elner Ames MATSU, RN Outcome: Adequate for Discharge   Problem: Nutritional: Goal: Maintenance of adequate nutrition will improve 01/03/2025 1530 by Elner Ames MATSU, RN Outcome: Adequate for Discharge 01/03/2025 1529 by Elner Ames MATSU, RN Outcome: Adequate for Discharge Goal: Progress toward achieving an optimal weight will improve 01/03/2025 1530 by Elner Ames MATSU, RN Outcome: Adequate for Discharge 01/03/2025 1529 by Elner Ames MATSU, RN Outcome: Adequate for Discharge   Problem: Skin Integrity: Goal: Risk for impaired skin integrity will decrease 01/03/2025 1530 by Elner Ames MATSU, RN Outcome: Adequate for Discharge 01/03/2025 1529 by Elner Ames MATSU, RN Outcome: Adequate for Discharge   Problem: Tissue Perfusion: Goal: Adequacy of tissue perfusion  will improve 01/03/2025 1530 by Elner Ames MATSU, RN Outcome: Adequate for Discharge 01/03/2025 1529 by Elner Ames MATSU, RN Outcome: Adequate for Discharge   "

## 2025-01-03 NOTE — Progress Notes (Signed)
 PT Cancellation Note  Patient Details Name: [name] White MRN: [id number] DOB: Aug 08, 1941   Cancelled Treatment:    Reason Eval/Treat Not Completed: PT screened, no needs identified, will sign off Patient from SNF. Currently, pt operating at recent functional baseline. Pt to return to SNF soon. PT will sign off at this time.  1:48 PM, 01/03/2025 Rosaria Settler, PT, DPT [place] with [facility]

## 2025-01-03 NOTE — Plan of Care (Signed)
 " Problem: Education: Goal: Knowledge of General Education information will improve Description: Including pain rating scale, medication(s)/side effects and non-pharmacologic comfort measures Outcome: Adequate for Discharge   Problem: Health Behavior/Discharge Planning: Goal: Ability to manage health-related needs will improve Outcome: Adequate for Discharge   Problem: Clinical Measurements: Goal: Ability to maintain clinical measurements within normal limits will improve Outcome: Adequate for Discharge Goal: Will remain free from infection Outcome: Adequate for Discharge Goal: Diagnostic test results will improve Outcome: Adequate for Discharge Goal: Respiratory complications will improve Outcome: Adequate for Discharge Goal: Cardiovascular complication will be avoided Outcome: Adequate for Discharge   Problem: Activity: Goal: Risk for activity intolerance will decrease Outcome: Adequate for Discharge   Problem: Coping: Goal: Level of anxiety will decrease Outcome: Adequate for Discharge   Problem: Elimination: Goal: Will not experience complications related to bowel motility Outcome: Adequate for Discharge Goal: Will not experience complications related to urinary retention Outcome: Adequate for Discharge   Problem: Pain Managment: Goal: General experience of comfort will improve and/or be controlled Outcome: Adequate for Discharge   Problem: Safety: Goal: Ability to remain free from injury will improve Outcome: Adequate for Discharge   Problem: Safety: Goal: Ability to remain free from injury will improve Outcome: Adequate for Discharge   Problem: Skin Integrity: Goal: Risk for impaired skin integrity will decrease Outcome: Adequate for Discharge   Problem: Education: Goal: Ability to describe self-care measures that may prevent or decrease complications (Diabetes Survival Skills Education) will improve Outcome: Adequate for Discharge Goal: Individualized  Educational Video(s) Outcome: Adequate for Discharge   Problem: Education: Goal: Individualized Educational Video(s) Outcome: Adequate for Discharge   Problem: Fluid Volume: Goal: Ability to maintain a balanced intake and output will improve Outcome: Adequate for Discharge   Problem: Health Behavior/Discharge Planning: Goal: Ability to identify and utilize available resources and services will improve Outcome: Adequate for Discharge Goal: Ability to manage health-related needs will improve Outcome: Adequate for Discharge   Problem: Metabolic: Goal: Ability to maintain appropriate glucose levels will improve Outcome: Adequate for Discharge   Problem: Nutritional: Goal: Maintenance of adequate nutrition will improve Outcome: Adequate for Discharge Goal: Progress toward achieving an optimal weight will improve Outcome: Adequate for Discharge   Problem: Skin Integrity: Goal: Risk for impaired skin integrity will decrease Outcome: Adequate for Discharge   Problem: Tissue Perfusion: Goal: Adequacy of tissue perfusion will improve Outcome: Adequate for Discharge   Problem: Education: Goal: Knowledge of General Education information will improve Description: Including pain rating scale, medication(s)/side effects and non-pharmacologic comfort measures Outcome: Adequate for Discharge   Problem: Health Behavior/Discharge Planning: Goal: Ability to manage health-related needs will improve Outcome: Adequate for Discharge   Problem: Clinical Measurements: Goal: Ability to maintain clinical measurements within normal limits will improve Outcome: Adequate for Discharge Goal: Will remain free from infection Outcome: Adequate for Discharge Goal: Diagnostic test results will improve Outcome: Adequate for Discharge Goal: Respiratory complications will improve Outcome: Adequate for Discharge Goal: Cardiovascular complication will be avoided Outcome: Adequate for Discharge    Problem: Activity: Goal: Risk for activity intolerance will decrease Outcome: Adequate for Discharge   Problem: Nutrition: Goal: Adequate nutrition will be maintained Outcome: Adequate for Discharge   Problem: Coping: Goal: Level of anxiety will decrease Outcome: Adequate for Discharge   Problem: Elimination: Goal: Will not experience complications related to bowel motility Outcome: Adequate for Discharge Goal: Will not experience complications related to urinary retention Outcome: Adequate for Discharge   Problem: Pain Managment: Goal: General experience of comfort will  improve and/or be controlled Outcome: Adequate for Discharge   Problem: Safety: Goal: Ability to remain free from injury will improve Outcome: Adequate for Discharge   Problem: Skin Integrity: Goal: Risk for impaired skin integrity will decrease Outcome: Adequate for Discharge   Problem: Education: Goal: Ability to describe self-care measures that may prevent or decrease complications (Diabetes Survival Skills Education) will improve Outcome: Adequate for Discharge Goal: Individualized Educational Video(s) Outcome: Adequate for Discharge   Problem: Coping: Goal: Ability to adjust to condition or change in health will improve Outcome: Adequate for Discharge   Problem: Fluid Volume: Goal: Ability to maintain a balanced intake and output will improve Outcome: Adequate for Discharge   Problem: Health Behavior/Discharge Planning: Goal: Ability to identify and utilize available resources and services will improve Outcome: Adequate for Discharge Goal: Ability to manage health-related needs will improve Outcome: Adequate for Discharge   Problem: Metabolic: Goal: Ability to maintain appropriate glucose levels will improve Outcome: Adequate for Discharge   Problem: Nutritional: Goal: Maintenance of adequate nutrition will improve Outcome: Adequate for Discharge Goal: Progress toward achieving an  optimal weight will improve Outcome: Adequate for Discharge   Problem: Skin Integrity: Goal: Risk for impaired skin integrity will decrease Outcome: Adequate for Discharge   Problem: Tissue Perfusion: Goal: Adequacy of tissue perfusion will improve Outcome: Adequate for Discharge   "

## 2025-01-03 NOTE — [place] FL2 (Signed)
 " [place]  MEDICAID FL2 LEVEL OF CARE FORM     IDENTIFICATION  Patient Name: [name] White Birthdate: 1941-11-24 Sex: female Admission Date (Current Location): 01/02/2025  [facility] and [place] Number:  [employer] and Address:  [facility],  [address] [place] [address]      Provider Number: [id number]  Attending Physician Name and Address:  Pearlean Manus, MD  Relative Name and Phone Number:  [name] [phone]    Current Level of Care: Hospital Recommended Level of Care: Skilled Nursing Facility Prior Approval Number:    Date Approved/Denied:   PASRR Number: [id number]  Discharge Plan: SNF    Current Diagnoses: Patient Active Problem List   Diagnosis Date Noted   SIRS (systemic inflammatory response syndrome) (HCC) 01/02/2025   Hypotension 12/23/2024   Hyperlipidemia associated with type 2 diabetes mellitus (HCC) 12/23/2024   Diabetic peripheral neuropathy (HCC) 12/23/2024   Hypokalemia 12/23/2024   Influenza A 12/22/2024   Acute hypoxic respiratory failure (HCC) 12/22/2024   Unspecified protein-calorie malnutrition 12/18/2024   Volume depletion 12/11/2024   Vascular dementia (HCC) 12/11/2024   Somnolence 12/11/2024   COPD with acute exacerbation (HCC) 11/10/2024   Lobar pneumonia 11/09/2024   Uncontrolled type 2 diabetes mellitus with hyperglycemia, with long-term current use of insulin  (HCC) 11/09/2024   CAP (community acquired pneumonia) 10/11/2024   Abnormal gait 07/10/2024   Cervical radiculopathy 07/10/2024   Chronic neck pain 07/10/2024   Visual disturbance 07/10/2024   Recurrent UTI 07/09/2024   Sepsis due to undetermined organism (HCC) 05/22/2024   Hypomagnesemia 05/01/2024   CVA (cerebral vascular accident) (HCC) 03/12/2024   Anxiety and depression 03/12/2024   GERD without esophagitis 03/12/2024   Pressure injury of skin 12/27/2023   Acute metabolic encephalopathy 12/26/2023   Status post transsphenoidal  pituitary resection 06/04/2020   Pituitary adenoma with extrasellar extension (HCC) 06/04/2020   Pituitary tumor 05/29/2020   Vitamin D  deficiency 01/28/2020   Paroxysmal supraventricular tachycardia 06/19/2014   Rectocele 03/19/2013   Constipation 03/19/2013   HYPERCHOLESTEROLEMIA 02/09/2009   Essential hypertension 02/09/2009   Osteoporosis 02/09/2009   Hypothyroidism 09/14/2007   Migraine headache 09/14/2007   Type 2 diabetes mellitus with peripheral neuropathy (HCC) 09/14/2007    Orientation RESPIRATION BLADDER Height & Weight     Self  Normal Incontinent Weight: 120 lb 2.4 oz (54.5 kg) Height:  5' 6 (167.6 cm)  BEHAVIORAL SYMPTOMS/MOOD NEUROLOGICAL BOWEL NUTRITION STATUS      Incontinent Diet (Heart Healthy)  AMBULATORY STATUS COMMUNICATION OF NEEDS Skin   Extensive Assist Verbally PU Stage and Appropriate Care (Pressure Injury Buttocks Right and Left Stage 2)   PU Stage 2 Dressing: Daily                   Personal Care Assistance Level of Assistance  Bathing, Feeding, Dressing Bathing Assistance: Maximum assistance Feeding assistance: Limited assistance Dressing Assistance: Maximum assistance     Functional Limitations Info  Sight, Hearing, Speech Sight Info: Impaired Hearing Info: Impaired Speech Info: Adequate    SPECIAL CARE FACTORS FREQUENCY  PT (By licensed PT), OT (By licensed OT)     PT Frequency: 5x/wk OT Frequency: 5x/wk            Contractures Contractures Info: Not present    Additional Factors Info  Code Status, Allergies Code Status Info: DNR Allergies Info: Betadine (Povidone Iodine), Povidone-iodine, Penicillins           Current Medications (01/03/2025):  This is the current hospital  active medication list Current Facility-Administered Medications  Medication Dose Route Frequency Provider Last Rate Last Admin   0.9 %  sodium chloride  infusion   Intravenous Continuous Emokpae, Ejiroghene E, MD 75 mL/hr at 01/02/25 2125 New Bag  at 01/02/25 2125   acetaminophen  (TYLENOL ) tablet 650 mg  650 mg Oral Q6H PRN Emokpae, Ejiroghene E, MD       Or   acetaminophen  (TYLENOL ) suppository 650 mg  650 mg Rectal Q6H PRN Emokpae, Ejiroghene E, MD       enoxaparin  (LOVENOX ) injection 40 mg  40 mg Subcutaneous Q24H Emokpae, Ejiroghene E, MD   40 mg at 01/02/25 2206   fluticasone  furoate-vilanterol (BREO ELLIPTA ) 200-25 MCG/ACT 1 puff  1 puff Inhalation Daily Emokpae, Ejiroghene E, MD   1 puff at 01/03/25 1002   guaiFENesin -dextromethorphan  (ROBITUSSIN DM) 100-10 MG/5ML syrup 15 mL  15 mL Oral Q8H Emokpae, Ejiroghene E, MD   15 mL at 01/03/25 0508   insulin  aspart (novoLOG ) injection 0-5 Units  0-5 Units Subcutaneous QHS Emokpae, Ejiroghene E, MD       insulin  aspart (novoLOG ) injection 0-9 Units  0-9 Units Subcutaneous TID WC Emokpae, Ejiroghene E, MD   2 Units at 01/03/25 1017   ipratropium-albuterol  (DUONEB) 0.5-2.5 (3) MG/3ML nebulizer solution 3 mL  3 mL Nebulization Q4H PRN Emokpae, Ejiroghene E, MD       levothyroxine  (SYNTHROID ) tablet 50 mcg  50 mcg Oral QAC breakfast Emokpae, Ejiroghene E, MD   50 mcg at 01/03/25 0507   methylPREDNISolone  sodium succinate  (SOLU-MEDROL ) 40 mg/mL injection 40 mg  40 mg Intravenous Q12H Emokpae, Ejiroghene E, MD   40 mg at 01/03/25 1016   midodrine  (PROAMATINE ) tablet 5 mg  5 mg Oral TID WC Emokpae, Ejiroghene E, MD   5 mg at 01/03/25 0800   ondansetron  (ZOFRAN ) tablet 4 mg  4 mg Oral Q6H PRN Emokpae, Ejiroghene E, MD       Or   ondansetron  (ZOFRAN ) injection 4 mg  4 mg Intravenous Q6H PRN Emokpae, Ejiroghene E, MD       polyethylene glycol (MIRALAX  / GLYCOLAX ) packet 17 g  17 g Oral Daily PRN Emokpae, Ejiroghene E, MD       pregabalin  (LYRICA ) capsule 150 mg  150 mg Oral BID Emokpae, Ejiroghene E, MD   150 mg at 01/03/25 1014   umeclidinium bromide  (INCRUSE ELLIPTA ) 62.5 MCG/ACT 1 puff  1 puff Inhalation Daily Emokpae, Ejiroghene E, MD   1 puff at 01/03/25 1002   venlafaxine  XR (EFFEXOR -XR) 24 hr  capsule 150 mg  150 mg Oral BH-q7a Emokpae, Ejiroghene E, MD   150 mg at 01/03/25 1014     Discharge Medications: Please see discharge summary for a list of discharge medications.  Relevant Imaging Results:  Relevant Lab Results:   Additional Information SSN: [id number]  Royanne Warshaw A Delanie Tirrell, LCSW     "

## 2025-01-03 NOTE — Care Management Obs Status (Signed)
 MEDICARE OBSERVATION STATUS NOTIFICATION   Patient Details  Name: [name] White MRN: [id number] Date of Birth: 1941-01-19   Medicare Observation Status Notification Given:  Yes    Hoy DELENA Bigness, LCSW 01/03/2025, 11:48 AM

## 2025-01-03 NOTE — Discharge Instructions (Signed)
 1)Short acting insulin  (Novolog )  per sliding scale 0-10 ---:-- Insulin  injection 0-10 Units 0-10 Units Subcutaneous, 3 times daily with meals CBG 70 - 120: 0 unit CBG 121 - 150: 0 unit  CBG 151 - 200: 0 unit CBG 201 - 250: 2 units CBG 251 - 300: 4 units CBG 301 - 350: 6 units  CBG 351 - 400: 8 units  CBG > 400: 10 units  2)Avoid Dehydration--- encourage adequate oral intake including Glucerna supplements  3)Repeat BMP in about 1 week

## 2025-01-03 NOTE — Plan of Care (Signed)
 " Problem: Education: Goal: Knowledge of General Education information will improve Description: Including pain rating scale, medication(s)/side effects and non-pharmacologic comfort measures 01/03/2025 1530 by Elner Ames MATSU, RN Outcome: Adequate for Discharge 01/03/2025 1530 by Elner Ames MATSU, RN Outcome: Adequate for Discharge 01/03/2025 1529 by Elner Ames MATSU, RN Outcome: Adequate for Discharge   Problem: Health Behavior/Discharge Planning: Goal: Ability to manage health-related needs will improve 01/03/2025 1530 by Elner Ames MATSU, RN Outcome: Adequate for Discharge 01/03/2025 1530 by Elner Ames MATSU, RN Outcome: Adequate for Discharge 01/03/2025 1529 by Elner Ames MATSU, RN Outcome: Adequate for Discharge   Problem: Clinical Measurements: Goal: Ability to maintain clinical measurements within normal limits will improve 01/03/2025 1530 by Elner Ames MATSU, RN Outcome: Adequate for Discharge 01/03/2025 1530 by Elner Ames MATSU, RN Outcome: Adequate for Discharge 01/03/2025 1529 by Elner Ames MATSU, RN Outcome: Adequate for Discharge Goal: Will remain free from infection 01/03/2025 1530 by Elner Ames MATSU, RN Outcome: Adequate for Discharge 01/03/2025 1530 by Elner Ames MATSU, RN Outcome: Adequate for Discharge 01/03/2025 1529 by Elner Ames MATSU, RN Outcome: Adequate for Discharge Goal: Diagnostic test results will improve 01/03/2025 1530 by Elner Ames MATSU, RN Outcome: Adequate for Discharge 01/03/2025 1530 by Elner Ames MATSU, RN Outcome: Adequate for Discharge 01/03/2025 1529 by Elner Ames MATSU, RN Outcome: Adequate for Discharge Goal: Respiratory complications will improve 01/03/2025 1530 by Elner Ames MATSU, RN Outcome: Adequate for Discharge 01/03/2025 1530 by Elner Ames MATSU, RN Outcome: Adequate for Discharge 01/03/2025 1529 by Elner Ames MATSU, RN Outcome: Adequate for Discharge Goal: Cardiovascular complication will be avoided 01/03/2025 1530 by Elner Ames MATSU, RN Outcome: Adequate for Discharge 01/03/2025 1530 by Elner Ames MATSU, RN Outcome: Adequate for Discharge 01/03/2025 1529 by Elner Ames MATSU, RN Outcome: Adequate for Discharge   Problem: Activity: Goal: Risk for activity intolerance will decrease 01/03/2025 1530 by Elner Ames MATSU, RN Outcome: Adequate for Discharge 01/03/2025 1530 by Elner Ames MATSU, RN Outcome: Adequate for Discharge 01/03/2025 1529 by Elner Ames MATSU, RN Outcome: Adequate for Discharge   Problem: Nutrition: Goal: Adequate nutrition will be maintained 01/03/2025 1530 by Elner Ames MATSU, RN Outcome: Adequate for Discharge 01/03/2025 1530 by Elner Ames MATSU, RN Outcome: Adequate for Discharge 01/03/2025 1529 by Elner Ames MATSU, RN Outcome: Adequate for Discharge   Problem: Coping: Goal: Level of anxiety will decrease 01/03/2025 1530 by Elner Ames MATSU, RN Outcome: Adequate for Discharge 01/03/2025 1530 by Elner Ames MATSU, RN Outcome: Adequate for Discharge 01/03/2025 1529 by Elner Ames MATSU, RN Outcome: Adequate for Discharge   Problem: Elimination: Goal: Will not experience complications related to bowel motility 01/03/2025 1530 by Elner Ames MATSU, RN Outcome: Adequate for Discharge 01/03/2025 1530 by Elner Ames MATSU, RN Outcome: Adequate for Discharge 01/03/2025 1529 by Elner Ames MATSU, RN Outcome: Adequate for Discharge Goal: Will not experience complications related to urinary retention 01/03/2025 1530 by Elner Ames MATSU, RN Outcome: Adequate for Discharge 01/03/2025 1530 by Elner Ames MATSU, RN Outcome: Adequate for Discharge 01/03/2025 1529 by Elner Ames MATSU, RN Outcome: Adequate for Discharge   Problem: Pain Managment: Goal: General experience of comfort will improve and/or be controlled 01/03/2025 1530 by Elner Ames MATSU, RN Outcome: Adequate for Discharge 01/03/2025 1530 by Elner Ames MATSU, RN Outcome: Adequate for Discharge 01/03/2025 1529 by Elner Ames MATSU, RN Outcome: Adequate  for Discharge   Problem: Safety: Goal: Ability to remain free from injury will improve 01/03/2025 1530 by Elner Ames MATSU, RN Outcome: Adequate for Discharge 01/03/2025 1530  by Elner Ames MATSU, RN Outcome: Adequate for Discharge 01/03/2025 1529 by Elner Ames MATSU, RN Outcome: Adequate for Discharge   Problem: Skin Integrity: Goal: Risk for impaired skin integrity will decrease 01/03/2025 1530 by Elner Ames MATSU, RN Outcome: Adequate for Discharge 01/03/2025 1530 by Elner Ames MATSU, RN Outcome: Adequate for Discharge 01/03/2025 1529 by Elner Ames MATSU, RN Outcome: Adequate for Discharge   Problem: Education: Goal: Ability to describe self-care measures that may prevent or decrease complications (Diabetes Survival Skills Education) will improve 01/03/2025 1530 by Elner Ames MATSU, RN Outcome: Adequate for Discharge 01/03/2025 1530 by Elner Ames MATSU, RN Outcome: Adequate for Discharge 01/03/2025 1529 by Elner Ames MATSU, RN Outcome: Adequate for Discharge Goal: Individualized Educational Video(s) 01/03/2025 1530 by Elner Ames MATSU, RN Outcome: Adequate for Discharge 01/03/2025 1530 by Elner Ames MATSU, RN Outcome: Adequate for Discharge 01/03/2025 1529 by Elner Ames MATSU, RN Outcome: Adequate for Discharge   Problem: Coping: Goal: Ability to adjust to condition or change in health will improve 01/03/2025 1530 by Elner Ames MATSU, RN Outcome: Adequate for Discharge 01/03/2025 1530 by Elner Ames MATSU, RN Outcome: Adequate for Discharge 01/03/2025 1529 by Elner Ames MATSU, RN Outcome: Adequate for Discharge   Problem: Fluid Volume: Goal: Ability to maintain a balanced intake and output will improve 01/03/2025 1530 by Elner Ames MATSU, RN Outcome: Adequate for Discharge 01/03/2025 1530 by Elner Ames MATSU, RN Outcome: Adequate for Discharge 01/03/2025 1529 by Elner Ames MATSU, RN Outcome: Adequate for Discharge   Problem: Health Behavior/Discharge Planning: Goal: Ability to  identify and utilize available resources and services will improve 01/03/2025 1530 by Elner Ames MATSU, RN Outcome: Adequate for Discharge 01/03/2025 1530 by Elner Ames MATSU, RN Outcome: Adequate for Discharge 01/03/2025 1529 by Elner Ames MATSU, RN Outcome: Adequate for Discharge Goal: Ability to manage health-related needs will improve 01/03/2025 1530 by Elner Ames MATSU, RN Outcome: Adequate for Discharge 01/03/2025 1530 by Elner Ames MATSU, RN Outcome: Adequate for Discharge 01/03/2025 1529 by Elner Ames MATSU, RN Outcome: Adequate for Discharge   Problem: Metabolic: Goal: Ability to maintain appropriate glucose levels will improve 01/03/2025 1530 by Elner Ames MATSU, RN Outcome: Adequate for Discharge 01/03/2025 1530 by Elner Ames MATSU, RN Outcome: Adequate for Discharge 01/03/2025 1529 by Elner Ames MATSU, RN Outcome: Adequate for Discharge   Problem: Nutritional: Goal: Maintenance of adequate nutrition will improve 01/03/2025 1530 by Elner Ames MATSU, RN Outcome: Adequate for Discharge 01/03/2025 1530 by Elner Ames MATSU, RN Outcome: Adequate for Discharge 01/03/2025 1529 by Elner Ames MATSU, RN Outcome: Adequate for Discharge Goal: Progress toward achieving an optimal weight will improve 01/03/2025 1530 by Elner Ames MATSU, RN Outcome: Adequate for Discharge 01/03/2025 1530 by Elner Ames MATSU, RN Outcome: Adequate for Discharge 01/03/2025 1529 by Elner Ames MATSU, RN Outcome: Adequate for Discharge   Problem: Skin Integrity: Goal: Risk for impaired skin integrity will decrease 01/03/2025 1530 by Elner Ames MATSU, RN Outcome: Adequate for Discharge 01/03/2025 1530 by Elner Ames MATSU, RN Outcome: Adequate for Discharge 01/03/2025 1529 by Elner Ames MATSU, RN Outcome: Adequate for Discharge   Problem: Tissue Perfusion: Goal: Adequacy of tissue perfusion will improve 01/03/2025 1530 by Elner Ames MATSU, RN Outcome: Adequate for Discharge 01/03/2025 1530 by Elner Ames MATSU,  RN Outcome: Adequate for Discharge 01/03/2025 1529 by Elner Ames MATSU, RN Outcome: Adequate for Discharge   Problem: Education: Goal: Knowledge of General Education information will improve Description: Including pain rating scale, medication(s)/side effects and non-pharmacologic comfort measures 01/03/2025 1530 by Buzz Axel,  Ames MATSU, RN Outcome: Adequate for Discharge 01/03/2025 1530 by Elner Ames MATSU, RN Outcome: Adequate for Discharge 01/03/2025 1529 by Elner Ames MATSU, RN Outcome: Adequate for Discharge   Problem: Health Behavior/Discharge Planning: Goal: Ability to manage health-related needs will improve 01/03/2025 1530 by Elner Ames MATSU, RN Outcome: Adequate for Discharge 01/03/2025 1530 by Elner Ames MATSU, RN Outcome: Adequate for Discharge 01/03/2025 1529 by Elner Ames MATSU, RN Outcome: Adequate for Discharge   Problem: Clinical Measurements: Goal: Ability to maintain clinical measurements within normal limits will improve 01/03/2025 1530 by Elner Ames MATSU, RN Outcome: Adequate for Discharge 01/03/2025 1530 by Elner Ames MATSU, RN Outcome: Adequate for Discharge 01/03/2025 1529 by Elner Ames MATSU, RN Outcome: Adequate for Discharge Goal: Will remain free from infection 01/03/2025 1530 by Elner Ames MATSU, RN Outcome: Adequate for Discharge 01/03/2025 1530 by Elner Ames MATSU, RN Outcome: Adequate for Discharge 01/03/2025 1529 by Elner Ames MATSU, RN Outcome: Adequate for Discharge Goal: Diagnostic test results will improve 01/03/2025 1530 by Elner Ames MATSU, RN Outcome: Adequate for Discharge 01/03/2025 1530 by Elner Ames MATSU, RN Outcome: Adequate for Discharge 01/03/2025 1529 by Elner Ames MATSU, RN Outcome: Adequate for Discharge Goal: Respiratory complications will improve 01/03/2025 1530 by Elner Ames MATSU, RN Outcome: Adequate for Discharge 01/03/2025 1530 by Elner Ames MATSU, RN Outcome: Adequate for Discharge 01/03/2025 1529 by Elner Ames MATSU,  RN Outcome: Adequate for Discharge Goal: Cardiovascular complication will be avoided 01/03/2025 1530 by Elner Ames MATSU, RN Outcome: Adequate for Discharge 01/03/2025 1530 by Elner Ames MATSU, RN Outcome: Adequate for Discharge 01/03/2025 1529 by Elner Ames MATSU, RN Outcome: Adequate for Discharge   Problem: Activity: Goal: Risk for activity intolerance will decrease 01/03/2025 1530 by Elner Ames MATSU, RN Outcome: Adequate for Discharge 01/03/2025 1530 by Elner Ames MATSU, RN Outcome: Adequate for Discharge 01/03/2025 1529 by Elner Ames MATSU, RN Outcome: Adequate for Discharge   Problem: Nutrition: Goal: Adequate nutrition will be maintained 01/03/2025 1530 by Elner Ames MATSU, RN Outcome: Adequate for Discharge 01/03/2025 1530 by Elner Ames MATSU, RN Outcome: Adequate for Discharge 01/03/2025 1529 by Elner Ames MATSU, RN Outcome: Adequate for Discharge   Problem: Coping: Goal: Level of anxiety will decrease 01/03/2025 1530 by Elner Ames MATSU, RN Outcome: Adequate for Discharge 01/03/2025 1530 by Elner Ames MATSU, RN Outcome: Adequate for Discharge 01/03/2025 1529 by Elner Ames MATSU, RN Outcome: Adequate for Discharge   Problem: Elimination: Goal: Will not experience complications related to bowel motility 01/03/2025 1530 by Elner Ames MATSU, RN Outcome: Adequate for Discharge 01/03/2025 1530 by Elner Ames MATSU, RN Outcome: Adequate for Discharge 01/03/2025 1529 by Elner Ames MATSU, RN Outcome: Adequate for Discharge Goal: Will not experience complications related to urinary retention 01/03/2025 1530 by Elner Ames MATSU, RN Outcome: Adequate for Discharge 01/03/2025 1530 by Elner Ames MATSU, RN Outcome: Adequate for Discharge 01/03/2025 1529 by Elner Ames MATSU, RN Outcome: Adequate for Discharge   Problem: Pain Managment: Goal: General experience of comfort will improve and/or be controlled 01/03/2025 1530 by Elner Ames MATSU, RN Outcome: Adequate for Discharge 01/03/2025  1530 by Elner Ames MATSU, RN Outcome: Adequate for Discharge 01/03/2025 1529 by Elner Ames MATSU, RN Outcome: Adequate for Discharge   Problem: Safety: Goal: Ability to remain free from injury will improve 01/03/2025 1530 by Elner Ames MATSU, RN Outcome: Adequate for Discharge 01/03/2025 1530 by Elner Ames MATSU, RN Outcome: Adequate for Discharge 01/03/2025 1529 by Elner Ames MATSU, RN Outcome: Adequate for Discharge   Problem: Skin Integrity: Goal:  Risk for impaired skin integrity will decrease 01/03/2025 1530 by Elner Ames MATSU, RN Outcome: Adequate for Discharge 01/03/2025 1530 by Elner Ames MATSU, RN Outcome: Adequate for Discharge 01/03/2025 1529 by Elner Ames MATSU, RN Outcome: Adequate for Discharge   Problem: Education: Goal: Ability to describe self-care measures that may prevent or decrease complications (Diabetes Survival Skills Education) will improve 01/03/2025 1530 by Elner Ames MATSU, RN Outcome: Adequate for Discharge 01/03/2025 1530 by Elner Ames MATSU, RN Outcome: Adequate for Discharge 01/03/2025 1529 by Elner Ames MATSU, RN Outcome: Adequate for Discharge Goal: Individualized Educational Video(s) 01/03/2025 1530 by Elner Ames MATSU, RN Outcome: Adequate for Discharge 01/03/2025 1530 by Elner Ames MATSU, RN Outcome: Adequate for Discharge 01/03/2025 1529 by Elner Ames MATSU, RN Outcome: Adequate for Discharge   Problem: Coping: Goal: Ability to adjust to condition or change in health will improve 01/03/2025 1530 by Elner Ames MATSU, RN Outcome: Adequate for Discharge 01/03/2025 1530 by Elner Ames MATSU, RN Outcome: Adequate for Discharge 01/03/2025 1529 by Elner Ames MATSU, RN Outcome: Adequate for Discharge   Problem: Fluid Volume: Goal: Ability to maintain a balanced intake and output will improve 01/03/2025 1530 by Elner Ames MATSU, RN Outcome: Adequate for Discharge 01/03/2025 1530 by Elner Ames MATSU, RN Outcome: Adequate for Discharge 01/03/2025 1529 by  Elner Ames MATSU, RN Outcome: Adequate for Discharge   Problem: Health Behavior/Discharge Planning: Goal: Ability to identify and utilize available resources and services will improve 01/03/2025 1530 by Elner Ames MATSU, RN Outcome: Adequate for Discharge 01/03/2025 1530 by Elner Ames MATSU, RN Outcome: Adequate for Discharge 01/03/2025 1529 by Elner Ames MATSU, RN Outcome: Adequate for Discharge Goal: Ability to manage health-related needs will improve 01/03/2025 1530 by Elner Ames MATSU, RN Outcome: Adequate for Discharge 01/03/2025 1530 by Elner Ames MATSU, RN Outcome: Adequate for Discharge 01/03/2025 1529 by Elner Ames MATSU, RN Outcome: Adequate for Discharge   Problem: Metabolic: Goal: Ability to maintain appropriate glucose levels will improve 01/03/2025 1530 by Elner Ames MATSU, RN Outcome: Adequate for Discharge 01/03/2025 1530 by Elner Ames MATSU, RN Outcome: Adequate for Discharge 01/03/2025 1529 by Elner Ames MATSU, RN Outcome: Adequate for Discharge   Problem: Nutritional: Goal: Maintenance of adequate nutrition will improve 01/03/2025 1530 by Elner Ames MATSU, RN Outcome: Adequate for Discharge 01/03/2025 1530 by Elner Ames MATSU, RN Outcome: Adequate for Discharge 01/03/2025 1529 by Elner Ames MATSU, RN Outcome: Adequate for Discharge Goal: Progress toward achieving an optimal weight will improve 01/03/2025 1530 by Elner Ames MATSU, RN Outcome: Adequate for Discharge 01/03/2025 1530 by Elner Ames MATSU, RN Outcome: Adequate for Discharge 01/03/2025 1529 by Elner Ames MATSU, RN Outcome: Adequate for Discharge   Problem: Skin Integrity: Goal: Risk for impaired skin integrity will decrease 01/03/2025 1530 by Elner Ames MATSU, RN Outcome: Adequate for Discharge 01/03/2025 1530 by Elner Ames MATSU, RN Outcome: Adequate for Discharge 01/03/2025 1529 by Elner Ames MATSU, RN Outcome: Adequate for Discharge   Problem: Tissue Perfusion: Goal: Adequacy of tissue perfusion  will improve 01/03/2025 1530 by Elner Ames MATSU, RN Outcome: Adequate for Discharge 01/03/2025 1530 by Elner Ames MATSU, RN Outcome: Adequate for Discharge 01/03/2025 1529 by Elner Ames MATSU, RN Outcome: Adequate for Discharge   "

## 2025-01-03 NOTE — Plan of Care (Signed)

## 2025-01-03 NOTE — Care Management CC44 (Signed)
"         Condition Code 44 Documentation Completed  Patient Details  Name: [name] White MRN: [id number] Date of Birth: Dec 06, 1941   Condition Code 44 given:  Yes Patient signature on Condition Code 44 notice:  Yes Documentation of 2 MD's agreement:  Yes Code 44 added to claim:  Yes    Hoy DELENA Bigness, LCSW 01/03/2025, 11:48 AM  "

## 2025-01-03 NOTE — Care Management Important Message (Signed)
 Important Message  Patient Details  Name: [name] White MRN: [id number] Date of Birth: 1941-07-08   Important Message Given:  N/A - LOS <3 / Initial given by admissions     [name] 01/03/2025, 2:48 PM

## 2025-01-05 NOTE — ED Provider Notes (Signed)
 " [facility] Provider Note   CSN: [id number] Arrival date & time: 01/02/25  1535     Patient presents with: Code Sepsis   [name] White is a 84 y.o. female.   Patient with a history of COPD.  Patient presents with cough and shortness of breath  The history is provided by the patient and medical records. No language interpreter was used.  Weakness Severity:  Moderate Onset quality:  Sudden Progression:  Worsening Chronicity:  New Context: not alcohol use   Relieved by:  Nothing Ineffective treatments:  None tried Associated symptoms: cough   Associated symptoms: no abdominal pain, no chest pain, no diarrhea, no frequency, no headaches and no seizures        Prior to Admission medications  Medication Sig Start Date End Date Taking? Authorizing Provider  albuterol  (PROVENTIL ) (2.5 MG/3ML) 0.083% nebulizer solution Take 3 mLs (2.5 mg total) by nebulization every 4 (four) hours as needed for wheezing or shortness of breath. 01/03/25 01/03/26 Yes Emokpae, Courage, MD  feeding supplement, GLUCERNA SHAKE, (GLUCERNA SHAKE) LIQD Take 237 mLs by mouth 3 (three) times daily between meals. 01/03/25  Yes Emokpae, Courage, MD  Glucerna (GLUCERNA) LIQD Take 237 mLs by mouth 2 (two) times daily. 01/03/25  Yes Pearlean Manus, MD  insulin  regular (HUMULIN R ) 100 units/mL injection Inject 0-0.1 mLs (0-10 Units total) into the skin 3 (three) times daily before meals. Short acting insulin  per sliding scale 0-10 ---:-- Insulin  injection 0-10 Units 0-10 Units Subcutaneous, 3 times daily with meals CBG 70 - 120: 0 unit CBG 121 - 150: 0 unit  CBG 151 - 200: 0 unit CBG 201 - 250: 2 units CBG 251 - 300: 4 units CBG 301 - 350: 6 units  CBG 351 - 400: 8 units  CBG > 400: 10 units 01/03/25  Yes Emokpae, Courage, MD  metFORMIN  (GLUCOPHAGE ) 850 MG tablet Take 1 tablet (850 mg total) by mouth 2 (two) times daily with a meal. 01/03/25  Yes Emokpae, Courage, MD  midodrine  (PROAMATINE ) 10 MG tablet Take 1  tablet (10 mg total) by mouth 3 (three) times daily. 01/03/25  Yes Emokpae, Courage, MD  predniSONE  (DELTASONE ) 20 MG tablet Take 2 tablets (40 mg total) by mouth daily with breakfast for 5 days. 01/03/25 01/08/25 Yes Emokpae, Courage, MD  acetaminophen  (TYLENOL ) 325 MG tablet Take 2 tablets (650 mg total) by mouth every 6 (six) hours as needed for mild pain (pain score 1-3) (or Fever >/= 101). 01/01/24   ShahmehdiAdriana LABOR, MD  albuterol  (VENTOLIN  HFA) 108 (90 Base) MCG/ACT inhaler Inhale 2 puffs into the lungs every 4 (four) hours as needed for wheezing or shortness of breath. 01/03/25   Pearlean Manus, MD  aspirin  EC 81 MG tablet Take 1 tablet (81 mg total) by mouth every morning. Swallow whole. 01/03/25   Pearlean Manus, MD  atorvastatin  (LIPITOR) 80 MG tablet Take 80 mg by mouth every morning.    [provider]  busPIRone  (BUSPAR ) 5 MG tablet Take 5 mg by mouth 3 (three) times daily as needed (anxiety).    [provider]  cyanocobalamin  (VITAMIN B12) 1000 MCG/ML injection Inject 1,000 mcg into the muscle every 30 (thirty) days. Given on the 23rd of each month 07/26/24   [provider]  cycloSPORINE  (RESTASIS ) 0.05 % ophthalmic emulsion Place 1 drop into both eyes 2 (two) times daily. Given at 0900 and 2100    [provider]  Estradiol  (IMVEXXY  MAINTENANCE PACK) 10 MCG  INST Place 1 suppository vaginally 2 (two) times a week. Mondays and Thursdays 12/26/24   Dino Antu, MD  fluticasone  furoate-vilanterol (BREO ELLIPTA ) 200-25 MCG/ACT AEPB Inhale 1 puff into the lungs daily. 10/16/24   Ricky Fines, MD  furosemide  (LASIX ) 20 MG tablet Take 1 tablet (20 mg total) by mouth every Monday, Wednesday, and Friday. 01/03/25   Pearlean Manus, MD  insulin  glargine (LANTUS ) 100 UNIT/ML injection Inject 10 Units into the skin at bedtime.    [provider]  ipratropium-albuterol  (DUONEB) 0.5-2.5 (3) MG/3ML SOLN Take 3 mLs by nebulization 3 (three) times daily. Given at  0900, 1200 and 2100    [provider]  levothyroxine  (SYNTHROID ) 50 MCG tablet Take 50 mcg by mouth daily before breakfast.    [provider]  Multiple Vitamins-Minerals (CENTRUM SILVER PO) Take 1 tablet by mouth every morning.    [provider]  omeprazole  (PRILOSEC) 20 MG capsule Take 1 capsule (20 mg total) by mouth 2 (two) times daily before a meal. 10/16/24   Ricky Fines, MD  polyethylene glycol (MIRALAX  / GLYCOLAX ) 17 g packet Take 17 g by mouth daily as needed for mild constipation. 05/01/24   Vicci Afton CROME, MD  potassium chloride  (KLOR-CON ) 10 MEQ tablet Take 10 mEq by mouth every morning. 05/15/24   [provider]  pregabalin  (LYRICA ) 150 MG capsule Take 1 capsule (150 mg total) by mouth 2 (two) times daily. Given at 0900 and 2100 12/25/24   Rashid, Farhan, MD  rizatriptan  (MAXALT ) 10 MG tablet Take 10 mg by mouth daily as needed for migraine. 09/12/24   [provider]  TRINTELLIX  10 MG TABS tablet Take 10 mg by mouth every morning. 03/09/24   [provider]  umeclidinium bromide  (INCRUSE ELLIPTA ) 62.5 MCG/ACT AEPB Inhale 1 puff into the lungs daily. 10/16/24   Ricky Fines, MD  venlafaxine  (EFFEXOR -XR) 150 MG 24 hr capsule Take 150 mg by mouth every morning.    [provider]    Allergies: Betadine [povidone iodine], Povidone-iodine, and Penicillins    Review of Systems  Constitutional:  Negative for appetite change and fatigue.  HENT:  Negative for congestion, ear discharge and sinus pressure.   Eyes:  Negative for discharge.  Respiratory:  Positive for cough.   Cardiovascular:  Negative for chest pain.  Gastrointestinal:  Negative for abdominal pain and diarrhea.  Genitourinary:  Negative for frequency and hematuria.  Musculoskeletal:  Negative for back pain.  Skin:  Negative for rash.  Neurological:  Positive for weakness. Negative for seizures and headaches.  Psychiatric/Behavioral:  Negative for  hallucinations.     Updated Vital Signs BP 90/70 (BP Location: Right Arm)   Pulse 88   Temp 97.8 F (36.6 C) (Oral)   Resp 14   Ht 5' 6 (1.676 m)   Wt 54.5 kg   SpO2 96%   BMI 19.39 kg/m   Physical Exam Vitals and nursing note reviewed.  Constitutional:      Appearance: She is well-developed.  HENT:     Head: Normocephalic.     Nose: Nose normal.  Eyes:     General: No scleral icterus.    Conjunctiva/sclera: Conjunctivae normal.  Neck:     Thyroid : No thyromegaly.  Cardiovascular:     Rate and Rhythm: Normal rate and regular rhythm.     Heart sounds: No murmur heard.    No friction rub. No gallop.  Pulmonary:     Breath sounds: No stridor. No wheezing or rales.  Chest:     Chest wall: No tenderness.  Abdominal:     General: There is no distension.     Tenderness: There is no abdominal tenderness. There is no rebound.  Musculoskeletal:        General: Normal range of motion.     Cervical back: Neck supple.  Lymphadenopathy:     Cervical: No cervical adenopathy.  Skin:    Findings: No erythema or rash.  Neurological:     Mental Status: She is alert and oriented to person, place, and time.     Motor: No abnormal muscle tone.     Coordination: Coordination normal.  Psychiatric:        Behavior: Behavior normal.     (all labs ordered are listed, but only abnormal results are displayed) Labs Reviewed  URINALYSIS, ROUTINE W REFLEX MICROSCOPIC - Abnormal; Notable for the following components:      Result Value   APPearance HAZY (*)    All other components within normal limits  BASIC METABOLIC PANEL WITH GFR - Abnormal; Notable for the following components:   Glucose, Bld 199 (*)    Calcium  7.6 (*)    All other components within normal limits  CBC - Abnormal; Notable for the following components:   WBC 15.3 (*)    RBC 3.80 (*)    Hemoglobin 11.0 (*)    HCT 34.2 (*)    RDW 17.0 (*)    All other components within normal limits  GLUCOSE, CAPILLARY -  Abnormal; Notable for the following components:   Glucose-Capillary 125 (*)    All other components within normal limits  GLUCOSE, CAPILLARY - Abnormal; Notable for the following components:   Glucose-Capillary 195 (*)    All other components within normal limits  GLUCOSE, CAPILLARY - Abnormal; Notable for the following components:   Glucose-Capillary 224 (*)    All other components within normal limits  CULTURE, BLOOD (ROUTINE X 2)  CULTURE, BLOOD (ROUTINE X 2)  URINE CULTURE  LACTIC ACID, PLASMA    EKG: EKG Interpretation Date/Time:  Thursday January 02 2025 16:01:25 EST Ventricular Rate:  107 PR Interval:  156 QRS Duration:  89 QT Interval:  345 QTC Calculation: 461 R Axis:   59  Text Interpretation: Sinus tachycardia Confirmed by Dasie Faden ([id number]) on 01/03/2025 10:06:27 AM  Radiology: No results found.   Procedures   Medications Ordered in the ED  0.9 %  sodium chloride  infusion (0 mLs Intravenous Stopped 01/03/25 1403)  sodium chloride  0.9 % bolus 1,000 mL (0 mLs Intravenous Stopped 01/02/25 1844)  cefTRIAXone  (ROCEPHIN ) 2 g in sodium chloride  0.9 % 100 mL IVPB (0 g Intravenous Stopped 01/02/25 1654)  sodium chloride  0.9 % bolus 1,000 mL (0 mLs Intravenous Stopped 01/03/25 1403)  guaiFENesin -dextromethorphan  (ROBITUSSIN DM) 100-10 MG/5ML syrup 15 mL (15 mLs Oral Given 01/03/25 1411)                                    Medical Decision Making Amount and/or Complexity of Data Reviewed Labs: ordered. Radiology: ordered.  Risk Decision regarding hospitalization.   Patient admitted for bronchitis, hypoxia and COPD exacerbation     Final diagnoses:  Acute sepsis [facility])    ED Discharge Orders          Ordered    albuterol  (VENTOLIN  HFA) 108 (90 Base) MCG/ACT inhaler  Every 4 hours PRN  01/03/25 1217    aspirin  EC 81 MG tablet  Every morning        01/03/25 1217    furosemide  (LASIX ) 20 MG tablet  Every M-W-F        01/03/25 1217    midodrine   (PROAMATINE ) 10 MG tablet  3 times daily        01/03/25 1217    insulin  regular (HUMULIN R ) 100 units/mL injection  3 times daily before meals        01/03/25 1217    metFORMIN  (GLUCOPHAGE ) 850 MG tablet  2 times daily with meals        01/03/25 1217    albuterol  (PROVENTIL ) (2.5 MG/3ML) 0.083% nebulizer solution  Every 4 hours PRN        01/03/25 1217    predniSONE  (DELTASONE ) 20 MG tablet  Daily with breakfast        01/03/25 1217    Increase activity slowly        01/03/25 1217    Discharge instructions  Status:  Canceled       Comments: 1)Short acting insulin  (Novolog )  per sliding scale 0-10 ---:-- Insulin  injection 0-10 Units 0-10 Units Subcutaneous, 3 times daily with meals CBG 70 - 120: 0 unit CBG 121 - 150: 0 unit  CBG 151 - 200: 0 unit CBG 201 - 250: 2 units CBG 251 - 300: 4 units CBG 301 - 350: 6 units  CBG 351 - 400: 8 units  CBG > 400: 10 units  2)Avoid Dehydration--- encourage adequate oral intake including Glucerna supplements   01/03/25 1217    Glucerna (GLUCERNA) LIQD  2 times daily        01/03/25 1217    No wound care        01/03/25 1217    Call MD for:  temperature >100.4        01/03/25 1217    Call MD for:  persistant nausea and vomiting        01/03/25 1217    Call MD for:  difficulty breathing, headache or visual disturbances        01/03/25 1217    Call MD for:  persistant dizziness or light-headedness        01/03/25 1217    feeding supplement, GLUCERNA SHAKE, (GLUCERNA SHAKE) LIQD  3 times daily between meals        01/03/25 1217    Discharge instructions       Comments: 1)Short acting insulin  (Novolog )  per sliding scale 0-10 ---:-- Insulin  injection 0-10 Units 0-10 Units Subcutaneous, 3 times daily with meals CBG 70 - 120: 0 unit CBG 121 - 150: 0 unit  CBG 151 - 200: 0 unit CBG 201 - 250: 2 units CBG 251 - 300: 4 units CBG 301 - 350: 6 units  CBG 351 - 400: 8 units  CBG > 400: 10 units  2)Avoid Dehydration--- encourage adequate oral intake including  Glucerna supplements  3)Repeat BMP in about 1 week   01/03/25 1218               Suzette Pac, MD 01/05/25 1025  "

## 2025-01-06 ENCOUNTER — Encounter: Payer: Self-pay | Admitting: Adult Health

## 2025-01-06 ENCOUNTER — Non-Acute Institutional Stay (SKILLED_NURSING_FACILITY): Payer: Self-pay | Admitting: Adult Health

## 2025-01-06 DIAGNOSIS — F419 Anxiety disorder, unspecified: Secondary | ICD-10-CM

## 2025-01-06 DIAGNOSIS — I10 Essential (primary) hypertension: Secondary | ICD-10-CM | POA: Diagnosis not present

## 2025-01-06 DIAGNOSIS — E1165 Type 2 diabetes mellitus with hyperglycemia: Secondary | ICD-10-CM

## 2025-01-06 DIAGNOSIS — Z7984 Long term (current) use of oral hypoglycemic drugs: Secondary | ICD-10-CM | POA: Diagnosis not present

## 2025-01-06 DIAGNOSIS — J441 Chronic obstructive pulmonary disease with (acute) exacerbation: Secondary | ICD-10-CM | POA: Diagnosis not present

## 2025-01-06 DIAGNOSIS — K219 Gastro-esophageal reflux disease without esophagitis: Secondary | ICD-10-CM | POA: Diagnosis not present

## 2025-01-06 DIAGNOSIS — F01C18 Vascular dementia, severe, with other behavioral disturbance: Secondary | ICD-10-CM | POA: Diagnosis not present

## 2025-01-06 DIAGNOSIS — E1169 Type 2 diabetes mellitus with other specified complication: Secondary | ICD-10-CM

## 2025-01-06 DIAGNOSIS — R6510 Systemic inflammatory response syndrome (SIRS) of non-infectious origin without acute organ dysfunction: Secondary | ICD-10-CM | POA: Diagnosis not present

## 2025-01-06 DIAGNOSIS — E1142 Type 2 diabetes mellitus with diabetic polyneuropathy: Secondary | ICD-10-CM | POA: Diagnosis not present

## 2025-01-06 DIAGNOSIS — N390 Urinary tract infection, site not specified: Secondary | ICD-10-CM | POA: Diagnosis not present

## 2025-01-06 DIAGNOSIS — J9601 Acute respiratory failure with hypoxia: Secondary | ICD-10-CM | POA: Diagnosis not present

## 2025-01-06 NOTE — Progress Notes (Signed)
 " Location:  [facility] Room Number: 126 Place of Service:  SNF (31)   CODE STATUS: dnr   Allergies[1]  Chief Complaint  Patient presents with   Hospitalization Follow-up    HPI:  She has been hospitalized from 01-02-25 through 01-03-25. She was hospitalized for SIRIS (systemic inflammatory response syndrome) and possible acute sepsis. She had altered mental status; she had hypoxia requiring 02 at 3 liters. Her po intake had declined as well. She was initiated on IV rocephin  and saline was initiated. She has returned to the facility after a 24 hour observation.   Past Medical History:  Diagnosis Date   Anemia    Anxiety    Asthma    ASYMPTOMATIC POSTMENOPAUSAL STATUS 02/09/2009   Cataract    Cerebral infarction [facility])    Constipation 03/19/2013   Depression    DIABETES MELLITUS, TYPE II 09/14/2007   HYPERCHOLESTEROLEMIA 02/09/2009   HYPERTENSION 02/09/2009   HYPOTHYROIDISM 09/14/2007   Metabolic encephalopathy    MIGRAINE HEADACHE 09/14/2007   Neuropathy    OSTEOPOROSIS 02/09/2009   PANCREATITIS 09/14/2007   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   PONV (postoperative nausea and vomiting)    Protein calorie malnutrition    Rectocele 03/19/2013   Shortness of breath    SUPERFICIAL PHLEBITIS 09/14/2007   Varicose veins     Past Surgical History:  Procedure Laterality Date   ABDOMINAL HYSTERECTOMY     ANTERIOR AND POSTERIOR REPAIR N/A 04/02/2013   Procedure: ANTERIOR (CYSTOCELE) AND POSTERIOR REPAIR (RECTOCELE);  Surgeon: Norleen LULLA Server, MD;  Location: [facility] ORS;  Service: Gynecology;  Laterality: N/A;   APPENDECTOMY     BRAIN SURGERY     CATARACT EXTRACTION W/PHACO  12/05/2011   Procedure: CATARACT EXTRACTION PHACO AND INTRAOCULAR LENS PLACEMENT (IOC);  Surgeon: Cherene Mania;  Location: [facility];  Service: Ophthalmology;  Laterality: Left;  CDE:9.65   CHOLECYSTECTOMY     CRANIOTOMY N/A 06/04/2020   Procedure: Endoscopic Transphenoidal Resection of Recurrent  PituitaryTumor;  Surgeon: Colon Shove, MD;  Location: [facility] OR;  Service: Neurosurgery;  Laterality: N/A;   CYSTOSCOPY     ENDOVENOUS ABLATION SAPHENOUS VEIN W/ LASER  11-03-2011   right greater saphenous vein   left leg done 10-2011   EYE SURGERY  98   right cataract extraction 98   LAPAROSCOPIC NISSEN FUNDOPLICATION     NM ESOPHAGEAL REFLUX  08-11-11   PITUITARY EXCISION  10/1997   POSTERIOR REPAIR     TRANSNASAL APPROACH N/A 06/04/2020   Procedure: TRANSNASAL APPROACH;  Surgeon: Mable Lenis, MD;  Location: [facility] OR;  Service: ENT;  Laterality: N/A;   TRANSPHENOIDAL / TRANSNASAL HYPOPHYSECTOMY / RESECTION PITUITARY TUMOR  08-11-11    Social History   Socioeconomic History   Marital status: Married    Spouse name: Not on file   Number of children: Not on file   Years of education: Not on file   Highest education level: Not on file  Occupational History   Occupation: Retired    Associate Professor: RETIRED  Tobacco Use   Smoking status: Never   Smokeless tobacco: Never  Vaping Use   Vaping status: Never Used  Substance and Sexual Activity   Alcohol use: No    Alcohol/week: 0.0 standard drinks of alcohol   Drug use: No   Sexual activity: Not Currently    Birth control/protection: Surgical  Other Topics Concern   Not on file  Social History Narrative   Not on file   Social Drivers of Health  Tobacco Use: Low Risk (01/06/2025)   Patient History    Smoking Tobacco Use: Never    Smokeless Tobacco Use: Never    Passive Exposure: Not on file  Financial Resource Strain: Not on file  Food Insecurity: Patient Unable To Answer (01/03/2025)   Epic    Worried About Programme Researcher, Broadcasting/film/video in the Last Year: Patient unable to answer    Ran Out of Food in the Last Year: Patient unable to answer  Transportation Needs: Patient Unable To Answer (01/03/2025)   Epic    Lack of Transportation (Medical): Patient unable to answer    Lack of Transportation (Non-Medical): Patient unable to answer   Physical Activity: Not on file  Stress: Not on file  Social Connections: Patient Unable To Answer (01/03/2025)   Social Connection and Isolation Panel    Frequency of Communication with Friends and Family: Patient unable to answer    Frequency of Social Gatherings with Friends and Family: Patient unable to answer    Attends Religious Services: Patient unable to answer    Active Member of Clubs or Organizations: Patient unable to answer    Attends Banker Meetings: Patient unable to answer    Marital Status: Patient unable to answer  Recent Concern: Social Connections - Socially Isolated (11/08/2024)   Social Connection and Isolation Panel    Frequency of Communication with Friends and Family: More than three times a week    Frequency of Social Gatherings with Friends and Family: Twice a week    Attends Religious Services: Never    Database Administrator or Organizations: No    Attends Banker Meetings: Never    Marital Status: Widowed  Intimate Partner Violence: Patient Unable To Answer (01/03/2025)   Epic    Fear of Current or Ex-Partner: Patient unable to answer    Emotionally Abused: Patient unable to answer    Physically Abused: Patient unable to answer    Sexually Abused: Patient unable to answer  Depression (PHQ2-9): Low Risk (10/28/2024)   Depression (PHQ2-9)    PHQ-2 Score: 0  Alcohol Screen: Not on file  Housing: Unknown (01/03/2025)   Epic    Unable to Pay for Housing in the Last Year: Patient unable to answer    Number of Times Moved in the Last Year: Not on file    Homeless in the Last Year: Not on file  Utilities: Patient Unable To Answer (01/03/2025)   Epic    Threatened with loss of utilities: Patient unable to answer  Health Literacy: Not on file   Family History  Problem Relation Age of Onset   Heart disease Mother    Asthma Mother    COPD Father    Heart disease Father    Stroke Father    Asthma Daughter    Migraines Daughter     Neuropathy Son    Cancer Neg Hx    Anesthesia problems Neg Hx    Hypotension Neg Hx    Malignant hyperthermia Neg Hx    Pseudochol deficiency Neg Hx       VITAL SIGNS BP (!) 113/54   Pulse 88   Temp 97.8 F (36.6 C)   Resp 20   Ht 5' 6 (1.676 m)   Wt 137 lb 12.8 oz (62.5 kg)   SpO2 97%   BMI 22.24 kg/m   Outpatient Encounter Medications as of 01/06/2025  Medication Sig Note   acetaminophen  (TYLENOL ) 325 MG tablet Take 2 tablets (650  mg total) by mouth every 6 (six) hours as needed for mild pain (pain score 1-3) (or Fever >/= 101).    albuterol  (PROVENTIL ) (2.5 MG/3ML) 0.083% nebulizer solution Take 3 mLs (2.5 mg total) by nebulization every 4 (four) hours as needed for wheezing or shortness of breath.    albuterol  (VENTOLIN  HFA) 108 (90 Base) MCG/ACT inhaler Inhale 2 puffs into the lungs every 4 (four) hours as needed for wheezing or shortness of breath.    aspirin  EC 81 MG tablet Take 1 tablet (81 mg total) by mouth every morning. Swallow whole.    atorvastatin  (LIPITOR) 80 MG tablet Take 80 mg by mouth every morning.    busPIRone  (BUSPAR ) 5 MG tablet Take 5 mg by mouth 3 (three) times daily as needed (anxiety).    cyanocobalamin  (VITAMIN B12) 1000 MCG/ML injection Inject 1,000 mcg into the muscle every 30 (thirty) days. Given on the 23rd of each month    cycloSPORINE  (RESTASIS ) 0.05 % ophthalmic emulsion Place 1 drop into both eyes 2 (two) times daily. Given at 0900 and 2100    Estradiol  (IMVEXXY  MAINTENANCE PACK) 10 MCG INST Place 1 suppository vaginally 2 (two) times a week. Mondays and Thursdays    feeding supplement, GLUCERNA SHAKE, (GLUCERNA SHAKE) LIQD Take 237 mLs by mouth 3 (three) times daily between meals.    fluticasone  furoate-vilanterol (BREO ELLIPTA ) 200-25 MCG/ACT AEPB Inhale 1 puff into the lungs daily.    furosemide  (LASIX ) 20 MG tablet Take 1 tablet (20 mg total) by mouth every Monday, Wednesday, and Friday.    Glucerna (GLUCERNA) LIQD Take 237 mLs by mouth 2  (two) times daily.    insulin  glargine (LANTUS ) 100 UNIT/ML injection Inject 10 Units into the skin at bedtime.    insulin  regular (HUMULIN R ) 100 units/mL injection Inject 0-0.1 mLs (0-10 Units total) into the skin 3 (three) times daily before meals. Short acting insulin  per sliding scale 0-10 ---:-- Insulin  injection 0-10 Units 0-10 Units Subcutaneous, 3 times daily with meals CBG 70 - 120: 0 unit CBG 121 - 150: 0 unit  CBG 151 - 200: 0 unit CBG 201 - 250: 2 units CBG 251 - 300: 4 units CBG 301 - 350: 6 units  CBG 351 - 400: 8 units  CBG > 400: 10 units    ipratropium-albuterol  (DUONEB) 0.5-2.5 (3) MG/3ML SOLN Take 3 mLs by nebulization 3 (three) times daily. Given at 0900, 1200 and 2100 12/22/2024: Two entries on MAR for same medication.   levothyroxine  (SYNTHROID ) 50 MCG tablet Take 50 mcg by mouth daily before breakfast.    metFORMIN  (GLUCOPHAGE ) 850 MG tablet Take 1 tablet (850 mg total) by mouth 2 (two) times daily with a meal.    midodrine  (PROAMATINE ) 10 MG tablet Take 1 tablet (10 mg total) by mouth 3 (three) times daily.    Multiple Vitamins-Minerals (CENTRUM SILVER PO) Take 1 tablet by mouth every morning.    omeprazole  (PRILOSEC) 20 MG capsule Take 1 capsule (20 mg total) by mouth 2 (two) times daily before a meal.    polyethylene glycol (MIRALAX  / GLYCOLAX ) 17 g packet Take 17 g by mouth daily as needed for mild constipation.    potassium chloride  (KLOR-CON ) 10 MEQ tablet Take 10 mEq by mouth every morning.    predniSONE  (DELTASONE ) 20 MG tablet Take 2 tablets (40 mg total) by mouth daily with breakfast for 5 days.    pregabalin  (LYRICA ) 150 MG capsule Take 1 capsule (150 mg total) by mouth 2 (two) times  daily. Given at 0900 and 2100    rizatriptan  (MAXALT ) 10 MG tablet Take 10 mg by mouth daily as needed for migraine.    TRINTELLIX  10 MG TABS tablet Take 10 mg by mouth every morning.    umeclidinium bromide  (INCRUSE ELLIPTA ) 62.5 MCG/ACT AEPB Inhale 1 puff into the lungs daily.     venlafaxine  (EFFEXOR -XR) 150 MG 24 hr capsule Take 150 mg by mouth every morning.    No facility-administered encounter medications on file as of 01/06/2025.     SIGNIFICANT DIAGNOSTIC EXAMS  LABS  12-10-24: wbc 13.9; hgb 11.2; hct 34.2; mcv 89.1 plt 245 glucose 176; bun 15; creat 0.59; k+ 4.7; na++ 135; ca 8.5 gfr >60; protein 6.4 albumin 3.9; blood culture: no growth; UA; neg 12-12-24: mag 1.5 12-15-24; wbc 10.1; hgb 10.7; hct 336.; mcv 90.3 pt 227; glucose 174; bun 6; creat 0.52; k+ 4.4; na++ 137; ca 8.6; gfr >60 mag 1.9 12-22-24: wbc 7.1; hgb 11.6; hct 37.0; mcv 90.7 plt 368; glucose 63; bun 19; creat 0.68; k+ 4.0; na++ 135; ca 8.2 gfr >60 protein 6.3 albumin 3.4    TODAY  01-02-25: wbc 20.5; hgb 14.2; hct 43.2; mcv 88.3 plt 340; glucose 153; bun 14.2; creat 1.13; k+ 4.7; na++ 131; ca 9.2; protein 7.1; albumin 3.5 tsh 0.514 lactic acid 2.8    Review of Systems  Constitutional:  Negative for malaise/fatigue.  Respiratory:  Negative for cough and shortness of breath.   Cardiovascular:  Negative for chest pain, palpitations and leg swelling.  Gastrointestinal:  Negative for abdominal pain, constipation and heartburn.  Musculoskeletal:  Negative for back pain, joint pain and myalgias.  Skin: Negative.   Neurological:  Negative for dizziness.  Psychiatric/Behavioral:  The patient is not nervous/anxious.     Physical Exam Constitutional:      General: She is not in acute distress.    Appearance: She is well-developed. She is not diaphoretic.  Neck:     Thyroid : No thyromegaly.  Cardiovascular:     Rate and Rhythm: Normal rate and regular rhythm.     Heart sounds: Normal heart sounds.  Pulmonary:     Effort: Pulmonary effort is normal. No respiratory distress.     Breath sounds: Normal breath sounds.  Abdominal:     General: Bowel sounds are normal. There is no distension.     Palpations: Abdomen is soft.     Tenderness: There is no abdominal tenderness.  Musculoskeletal:         General: Normal range of motion.     Cervical back: Neck supple.     Right lower leg: No edema.     Left lower leg: No edema.  Lymphadenopathy:     Cervical: No cervical adenopathy.  Skin:    General: Skin is warm and dry.  Neurological:     Mental Status: She is alert. Mental status is at baseline.  Psychiatric:        Mood and Affect: Mood normal.       ASSESSMENT/ PLAN:   TODAY  Bronchitis; hypoxia: will complete prednisone  40 mg daily through 01-08-25.   2. Hypotension, unspecified hypotension type: will continue midodrine  10 mg three times daily   3. GERD without esophagitis: will continue prilosec 20 mg twice daily   4. COPD with acute exacerbation: will continue incruse ellipta  62.5 mcg 1 puff daily ; will stop the albuterol  and will begin duoneb every 4 hours as needed    5. Other migraine without status migrainous not  intractable has maxalt  10 mg daily as needed   6. Type 2 diabetes mellitus with peripheral neuropathy: will continue metformin  850mg  twice daily' lantus  10 units nightly and lispro 5 units with meals.    7. Hyperlipidemia associated with type 2 diabetes mellitus: will continue lipitor 80 mg daily   8. Diabetic peripheral neuropathy: will continue lyrica  150 mg twice daily   9. Severe vascular dementia with other behavioral disturbance weight is 137.8 pounds  10. Recurrent UTI: is on long term estradiol  twice weekly   11. Anxiety and depression: will continue trintellix  10 mg daily; venlafaxine  er 150 mg daily will make the trazodone  50 mg nightly  buspar  5 mg three times daily   12. Acquired hypothyroidism: will continue synthroid  50 mcg  daily   13. Hypokalemia will continue to monitor  14. Vitamin b 12 deficiency: is on monthly injections  15. Chronic constipation: will continue miralax  daily as needed  16. Bilateral lower extremity edema: will continue lasix  20 mg daily     Barnie Seip NP [facility]  Contact  [phone] Monday through Friday 8am- 5pm  After hours call [phone]      [1]  Allergies Allergen Reactions   Betadine [Povidone Iodine] Hives   Povidone-Iodine Hives   Penicillins Rash and Dermatitis   "

## 2025-01-07 ENCOUNTER — Other Ambulatory Visit (HOSPITAL_COMMUNITY)
Admission: RE | Admit: 2025-01-07 | Discharge: 2025-01-07 | Disposition: A | Discharge status: Home / Self Care | Source: Skilled Nursing Facility | Attending: Adult Health | Admitting: Adult Health

## 2025-01-07 ENCOUNTER — Non-Acute Institutional Stay (SKILLED_NURSING_FACILITY): Admitting: Internal Medicine

## 2025-01-07 ENCOUNTER — Encounter: Payer: Self-pay | Admitting: Internal Medicine

## 2025-01-07 ENCOUNTER — Encounter: Payer: Self-pay | Admitting: Adult Health

## 2025-01-07 DIAGNOSIS — G9341 Metabolic encephalopathy: Secondary | ICD-10-CM

## 2025-01-07 DIAGNOSIS — R6510 Systemic inflammatory response syndrome (SIRS) of non-infectious origin without acute organ dysfunction: Secondary | ICD-10-CM

## 2025-01-07 DIAGNOSIS — J441 Chronic obstructive pulmonary disease with (acute) exacerbation: Secondary | ICD-10-CM | POA: Diagnosis not present

## 2025-01-07 DIAGNOSIS — F419 Anxiety disorder, unspecified: Secondary | ICD-10-CM | POA: Insufficient documentation

## 2025-01-07 DIAGNOSIS — D649 Anemia, unspecified: Secondary | ICD-10-CM | POA: Insufficient documentation

## 2025-01-07 LAB — CULTURE, BLOOD (ROUTINE X 2)
Culture: NO GROWTH
Culture: NO GROWTH
Special Requests: ADEQUATE

## 2025-01-07 LAB — D-DIMER, QUANTITATIVE: D-Dimer, Quant: 1.34 ug{FEU}/mL — ABNORMAL HIGH (ref 0.00–0.50)

## 2025-01-07 NOTE — Patient Instructions (Signed)
 See assessment and plan under each diagnosis in the problem list and acutely for this visit

## 2025-01-07 NOTE — Assessment & Plan Note (Signed)
 Currently O2 sats.  On nasal oxygen.  Patient afebrile.

## 2025-01-07 NOTE — Assessment & Plan Note (Signed)
 Today she is confabulating & profoundly anxious. Trazadone will be restarted & albuterol  orders adjusted to prevent risk of toxicity.

## 2025-01-07 NOTE — Assessment & Plan Note (Signed)
 She will be changed to DuoNeb as there is a potential for beta agonist toxicity with the present pulmonary toilet orders.

## 2025-01-07 NOTE — Progress Notes (Signed)
 "   NURSING HOME LOCATION:  [facility] ROOM NUMBER: 1 [id number]  CODE STATUS: DNR  PCP: [name], MD   This is a nursing facility follow up visit for Nursing Facility readmission within 30 days.  Interim medical record and care since last SNF visit was updated with review of diagnostic studies and change in clinical status since last visit were documented.  HPI: She was rehospitalized 1/8 - 01/03/2025, admitted with possible SIRS (systemic inflammatory response syndrome) and possible acute sepsis. She presented to the ED from [facility] with altered mental status present for 48 hours PTA.  This was in the context of dyspnea and cough and associated hypoxia requiring 3 L of nasal oxygen.  Nutritional intake had decreased over the several days PTA.  Because of hypotension and potential drug-drug interaction Trintellix  had been discontinued. Labs drawn at the SNF revealed lactic acid level 2.8 and a white count of 20,500 with L shift. Urine and blood cultures were negative.  On exam rhonchi and wheezing were present.  Chest x-ray revealed suboptimal inspiration to 6.5 interspaces; no infiltrates were present.  IV Rocephin  and normal saline were initiated. This readmission was in the context of the recent hospitalization 12/22/2024 to 12/25/2024 for acute metabolic encephalopathy, COPD exacerbation with hypoxia in the context of influenza A infection. Significant hypocalcemia was present with a value of 7.6.  Initial creatinine was 1.13 with a GFR of 48 indicating low stage IIIa CKD.  Final values were 0.74 with a GFR greater than 60 indicating CKD stage II.  Initial H/H was 14.2/43.7; final values were 11/34.2.  Indices were normochromic, normocytic.  Leukocytosis improved with a final value of 15,300.  This is in the context of recent high-dos parenteral and oral steroids.   TSH was low normal at 0.514. She was felt to be clinically stable enough to return to the SNF for rehab.At  discharge trazodone  was discontinued.   ROS:She can provide no meaningful history; statements appear confabulatory.  She stated breathing not doing real good but she denied cough.  When asked active symptoms her response was just cannot, cannot do anything, do not know if they found anything (in the hospital).  I need to, I need to make a difference okay I do not know.      Physical exam:  Pertinent or positive findings: She was sitting in the wheelchair.  She was profoundly anxious and intermittently tearful.  Facies are weathered.  Eyebrows are essentially absent.  She is wearing nasal oxygen.  1st and 2nd heart sounds are accentuated.  Raspy systolic murmur is suggested.  She has expiratory rhonchi on the right.  Pedal pulses are not palpable.  Hands reveal fine tremor.  Limb atrophy and interosseous wasting are present.  General appearance: no increased work of breathing is present.   Lymphatic: No lymphadenopathy about the head, neck, axilla. Eyes: No conjunctival inflammation or lid edema is present. There is no scleral icterus. Ears:  External ear exam shows no significant lesions or deformities.   Nose:  External nasal examination shows no deformity or inflammation. Nasal mucosa are pink and moist without lesions, exudates Oral exam:  Lips and gums are healthy appearing. There is no oropharyngeal erythema or exudate. Neck:  No thyromegaly, masses, tenderness noted.    Heart:  Normal rate and regular rhythm without gallop,  click, rub .  Lungs:  without wheezes, rales, rubs. Abdomen: Bowel sounds are normal. Abdomen is soft and nontender with no organomegaly,  hernias, masses. GU: Deferred  Extremities:  No cyanosis, clubbing, edema  Neurologic exam :Balance, Rhomberg, finger to nose testing could not be completed due to clinical state Skin: Warm & dry w/o tenting. No significant lesions or rash.  See summary under each active problem in the Problem List with associated updated  therapeutic plan :  SIRS (systemic inflammatory response syndrome) (HCC) Currently O2 sats.  On nasal oxygen.  Patient afebrile.  COPD with acute exacerbation (HCC) She will be changed to DuoNeb as there is a potential for beta agonist toxicity with the present pulmonary toilet orders.  Acute metabolic encephalopathy Today she is confabulating & profoundly anxious. Trazadone will be restarted & albuterol  orders adjusted to prevent risk of toxicity.   "

## 2025-01-07 NOTE — Progress Notes (Signed)
 " Location:  [facility] Room Number: 125 Place of Service:  SNF (31)   CODE STATUS:   Allergies[1]  Chief Complaint  Patient presents with   Acute Visit    Cough and congestion     Error    HPI:    Past Medical History:  Diagnosis Date   Anemia    Anxiety    Asthma    ASYMPTOMATIC POSTMENOPAUSAL STATUS 02/09/2009   Cataract    Cerebral infarction (HCC)    Constipation 03/19/2013   Depression    DIABETES MELLITUS, TYPE II 09/14/2007   HYPERCHOLESTEROLEMIA 02/09/2009   HYPERTENSION 02/09/2009   HYPOTHYROIDISM 09/14/2007   Metabolic encephalopathy    MIGRAINE HEADACHE 09/14/2007   Neuropathy    OSTEOPOROSIS 02/09/2009   PANCREATITIS 09/14/2007   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   PONV (postoperative nausea and vomiting)    Protein calorie malnutrition    Rectocele 03/19/2013   Shortness of breath    SUPERFICIAL PHLEBITIS 09/14/2007   Varicose veins     Past Surgical History:  Procedure Laterality Date   ABDOMINAL HYSTERECTOMY     ANTERIOR AND POSTERIOR REPAIR N/A 04/02/2013   Procedure: ANTERIOR (CYSTOCELE) AND POSTERIOR REPAIR (RECTOCELE);  Surgeon: Norleen LULLA Server, MD;  Location: [facility];  Service: Gynecology;  Laterality: N/A;   APPENDECTOMY     BRAIN SURGERY     CATARACT EXTRACTION W/PHACO  12/05/2011   Procedure: CATARACT EXTRACTION PHACO AND INTRAOCULAR LENS PLACEMENT (IOC);  Surgeon: Cherene Mania;  Location: [facility];  Service: Ophthalmology;  Laterality: Left;  CDE:9.65   CHOLECYSTECTOMY     CRANIOTOMY N/A 06/04/2020   Procedure: Endoscopic Transphenoidal Resection of Recurrent PituitaryTumor;  Surgeon: Colon Shove, MD;  Location: [facility] OR;  Service: Neurosurgery;  Laterality: N/A;   CYSTOSCOPY     ENDOVENOUS ABLATION SAPHENOUS VEIN W/ LASER  11-03-2011   right greater saphenous vein   left leg done 10-2011   EYE SURGERY  98   right cataract extraction 98   LAPAROSCOPIC NISSEN FUNDOPLICATION     NM ESOPHAGEAL REFLUX  08-11-11    PITUITARY EXCISION  10/1997   POSTERIOR REPAIR     TRANSNASAL APPROACH N/A 06/04/2020   Procedure: TRANSNASAL APPROACH;  Surgeon: Mable Lenis, MD;  Location: [facility] OR;  Service: ENT;  Laterality: N/A;   TRANSPHENOIDAL / TRANSNASAL HYPOPHYSECTOMY / RESECTION PITUITARY TUMOR  08-11-11    Social History   Socioeconomic History   Marital status: Married    Spouse name: Not on file   Number of children: Not on file   Years of education: Not on file   Highest education level: Not on file  Occupational History   Occupation: Retired    Associate Professor: RETIRED  Tobacco Use   Smoking status: Never   Smokeless tobacco: Never  Vaping Use   Vaping status: Never Used  Substance and Sexual Activity   Alcohol use: No    Alcohol/week: 0.0 standard drinks of alcohol   Drug use: No   Sexual activity: Not Currently    Birth control/protection: Surgical  Other Topics Concern   Not on file  Social History Narrative   Not on file   Social Drivers of Health   Tobacco Use: Low Risk (01/07/2025)   Patient History    Smoking Tobacco Use: Never    Smokeless Tobacco Use: Never    Passive Exposure: Not on file  Financial Resource Strain: Not on file  Food Insecurity: Patient Unable To Answer (01/03/2025)   Epic  Worried About Programme Researcher, Broadcasting/film/video in the Last Year: Patient unable to answer    Ran Out of Food in the Last Year: Patient unable to answer  Transportation Needs: Patient Unable To Answer (01/03/2025)   Epic    Lack of Transportation (Medical): Patient unable to answer    Lack of Transportation (Non-Medical): Patient unable to answer  Physical Activity: Not on file  Stress: Not on file  Social Connections: Patient Unable To Answer (01/03/2025)   Social Connection and Isolation Panel    Frequency of Communication with Friends and Family: Patient unable to answer    Frequency of Social Gatherings with Friends and Family: Patient unable to answer    Attends Religious Services: Patient unable to  answer    Active Member of Clubs or Organizations: Patient unable to answer    Attends Banker Meetings: Patient unable to answer    Marital Status: Patient unable to answer  Recent Concern: Social Connections - Socially Isolated (11/08/2024)   Social Connection and Isolation Panel    Frequency of Communication with Friends and Family: More than three times a week    Frequency of Social Gatherings with Friends and Family: Twice a week    Attends Religious Services: Never    Database Administrator or Organizations: No    Attends Banker Meetings: Never    Marital Status: Widowed  Intimate Partner Violence: Patient Unable To Answer (01/03/2025)   Epic    Fear of Current or Ex-Partner: Patient unable to answer    Emotionally Abused: Patient unable to answer    Physically Abused: Patient unable to answer    Sexually Abused: Patient unable to answer  Depression (PHQ2-9): Low Risk (10/28/2024)   Depression (PHQ2-9)    PHQ-2 Score: 0  Alcohol Screen: Not on file  Housing: Unknown (01/03/2025)   Epic    Unable to Pay for Housing in the Last Year: Patient unable to answer    Number of Times Moved in the Last Year: Not on file    Homeless in the Last Year: Not on file  Utilities: Patient Unable To Answer (01/03/2025)   Epic    Threatened with loss of utilities: Patient unable to answer  Health Literacy: Not on file   Family History  Problem Relation Age of Onset   Heart disease Mother    Asthma Mother    COPD Father    Heart disease Father    Stroke Father    Asthma Daughter    Migraines Daughter    Neuropathy Son    Cancer Neg Hx    Anesthesia problems Neg Hx    Hypotension Neg Hx    Malignant hyperthermia Neg Hx    Pseudochol deficiency Neg Hx       VITAL SIGNS BP 125/66   Pulse 87   Temp (!) 96.9 F (36.1 C)   Resp 20   Ht 5' 6 (1.676 m)   Wt 137 lb 12.8 oz (62.5 kg)   SpO2 99%   BMI 22.24 kg/m   Outpatient Encounter Medications as of  01/07/2025  Medication Sig Note   acetaminophen  (TYLENOL ) 325 MG tablet Take 2 tablets (650 mg total) by mouth every 6 (six) hours as needed for mild pain (pain score 1-3) (or Fever >/= 101).    aspirin  EC 81 MG tablet Take 1 tablet (81 mg total) by mouth every morning. Swallow whole.    atorvastatin  (LIPITOR) 80 MG tablet Take 80 mg by  mouth every morning.    busPIRone  (BUSPAR ) 5 MG tablet Take 5 mg by mouth 3 (three) times daily.    cyanocobalamin  (VITAMIN B12) 1000 MCG/ML injection Inject 1,000 mcg into the muscle every 30 (thirty) days. Given on the 23rd of each month    cycloSPORINE  (RESTASIS ) 0.05 % ophthalmic emulsion Place 1 drop into both eyes 2 (two) times daily. Given at 0900 and 2100    Estradiol  (IMVEXXY  MAINTENANCE PACK) 10 MCG INST Place 1 suppository vaginally 2 (two) times a week. Mondays and Thursdays    feeding supplement, GLUCERNA SHAKE, (GLUCERNA SHAKE) LIQD Take 237 mLs by mouth 3 (three) times daily between meals.    fluticasone  furoate-vilanterol (BREO ELLIPTA ) 200-25 MCG/ACT AEPB Inhale 1 puff into the lungs daily.    furosemide  (LASIX ) 20 MG tablet Take 1 tablet (20 mg total) by mouth every Monday, Wednesday, and Friday.    Glucerna (GLUCERNA) LIQD Take 237 mLs by mouth 2 (two) times daily.    insulin  glargine (LANTUS ) 100 UNIT/ML injection Inject 10 Units into the skin at bedtime.    insulin  lispro (HUMALOG) 100 UNIT/ML injection Inject 5 Units into the skin 3 (three) times daily with meals.    ipratropium-albuterol  (DUONEB) 0.5-2.5 (3) MG/3ML SOLN Take 3 mLs by nebulization every 4 (four) hours as needed. 12/22/2024: Two entries on MAR for same medication.   levothyroxine  (SYNTHROID ) 50 MCG tablet Take 50 mcg by mouth daily before breakfast.    metFORMIN  (GLUCOPHAGE ) 850 MG tablet Take 1 tablet (850 mg total) by mouth 2 (two) times daily with a meal.    midodrine  (PROAMATINE ) 10 MG tablet Take 1 tablet (10 mg total) by mouth 3 (three) times daily.    Multiple  Vitamins-Minerals (CENTRUM SILVER PO) Take 1 tablet by mouth every morning.    omeprazole  (PRILOSEC) 20 MG capsule Take 1 capsule (20 mg total) by mouth 2 (two) times daily before a meal.    polyethylene glycol (MIRALAX  / GLYCOLAX ) 17 g packet Take 17 g by mouth daily as needed for mild constipation.    potassium chloride  (KLOR-CON ) 10 MEQ tablet Take 10 mEq by mouth every morning.    predniSONE  (DELTASONE ) 20 MG tablet Take 2 tablets (40 mg total) by mouth daily with breakfast for 5 days.    pregabalin  (LYRICA ) 150 MG capsule Take 1 capsule (150 mg total) by mouth 2 (two) times daily. Given at 0900 and 2100    rizatriptan  (MAXALT ) 10 MG tablet Take 10 mg by mouth daily as needed for migraine.    traZODone  (DESYREL ) 50 MG tablet Take 50 mg by mouth at bedtime.    TRINTELLIX  10 MG TABS tablet Take 10 mg by mouth every morning.    umeclidinium bromide  (INCRUSE ELLIPTA ) 62.5 MCG/ACT AEPB Inhale 1 puff into the lungs daily.    venlafaxine  (EFFEXOR -XR) 150 MG 24 hr capsule Take 150 mg by mouth every morning.    No facility-administered encounter medications on file as of 01/07/2025.     SIGNIFICANT DIAGNOSTIC EXAMS       ASSESSMENT/ PLAN:     Barnie Seip NP [facility]  Contact [phone] Monday through Friday 8am- 5pm  After hours call [phone]  This encounter was created in error - please disregard.    [1]  Allergies Allergen Reactions   Betadine [Povidone Iodine] Hives   Povidone-Iodine Hives   Penicillins Rash and Dermatitis   "

## 2025-01-09 ENCOUNTER — Other Ambulatory Visit: Payer: Self-pay | Admitting: Adult Health

## 2025-01-09 MED ORDER — PREGABALIN 150 MG PO CAPS: 150.0000 mg | ORAL_CAPSULE | Freq: Two times a day (BID) | ORAL | 0 refills | Status: AC

## 2025-01-13 ENCOUNTER — Non-Acute Institutional Stay (SKILLED_NURSING_FACILITY): Payer: Self-pay | Admitting: Adult Health

## 2025-01-13 DIAGNOSIS — E1142 Type 2 diabetes mellitus with diabetic polyneuropathy: Secondary | ICD-10-CM | POA: Diagnosis not present

## 2025-01-13 DIAGNOSIS — Z794 Long term (current) use of insulin: Secondary | ICD-10-CM

## 2025-01-13 NOTE — Progress Notes (Signed)
 " Location:  [facility] Room Number: 124 Place of Service:  SNF (31)   CODE STATUS: dnr   Allergies[1]  Chief Complaint  Patient presents with   Acute Visit    Diabetes     HPI:  She has been havin lower cbg readings in the AM 70-80. Her cbg max was 253 with most of her readings in the 100's. She is presently taking lantus  15 units nightly and lispro 5 units with meals.   Past Medical History:  Diagnosis Date   Anemia    Anxiety    Asthma    ASYMPTOMATIC POSTMENOPAUSAL STATUS 02/09/2009   Cataract    Cerebral infarction [facility])    Constipation 03/19/2013   Depression    DIABETES MELLITUS, TYPE II 09/14/2007   HYPERCHOLESTEROLEMIA 02/09/2009   HYPERTENSION 02/09/2009   HYPOTHYROIDISM 09/14/2007   Metabolic encephalopathy    MIGRAINE HEADACHE 09/14/2007   Neuropathy    OSTEOPOROSIS 02/09/2009   PANCREATITIS 09/14/2007   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   PONV (postoperative nausea and vomiting)    Protein calorie malnutrition    Rectocele 03/19/2013   Shortness of breath    SUPERFICIAL PHLEBITIS 09/14/2007   Varicose veins     Past Surgical History:  Procedure Laterality Date   ABDOMINAL HYSTERECTOMY     ANTERIOR AND POSTERIOR REPAIR N/A 04/02/2013   Procedure: ANTERIOR (CYSTOCELE) AND POSTERIOR REPAIR (RECTOCELE);  Surgeon: Norleen LULLA Server, MD;  Location: [facility];  Service: Gynecology;  Laterality: N/A;   APPENDECTOMY     BRAIN SURGERY     CATARACT EXTRACTION W/PHACO  12/05/2011   Procedure: CATARACT EXTRACTION PHACO AND INTRAOCULAR LENS PLACEMENT (IOC);  Surgeon: Cherene Mania;  Location: [facility];  Service: Ophthalmology;  Laterality: Left;  CDE:9.65   CHOLECYSTECTOMY     CRANIOTOMY N/A 06/04/2020   Procedure: Endoscopic Transphenoidal Resection of Recurrent PituitaryTumor;  Surgeon: Colon Shove, MD;  Location: [facility] OR;  Service: Neurosurgery;  Laterality: N/A;   CYSTOSCOPY     ENDOVENOUS ABLATION SAPHENOUS VEIN W/ LASER  11-03-2011   right greater  saphenous vein   left leg done 10-2011   EYE SURGERY  98   right cataract extraction 98   LAPAROSCOPIC NISSEN FUNDOPLICATION     NM ESOPHAGEAL REFLUX  08-11-11   PITUITARY EXCISION  10/1997   POSTERIOR REPAIR     TRANSNASAL APPROACH N/A 06/04/2020   Procedure: TRANSNASAL APPROACH;  Surgeon: Mable Lenis, MD;  Location: [facility] OR;  Service: ENT;  Laterality: N/A;   TRANSPHENOIDAL / TRANSNASAL HYPOPHYSECTOMY / RESECTION PITUITARY TUMOR  08-11-11    Social History   Socioeconomic History   Marital status: Married    Spouse name: Not on file   Number of children: Not on file   Years of education: Not on file   Highest education level: Not on file  Occupational History   Occupation: Retired    Associate Professor: RETIRED  Tobacco Use   Smoking status: Never   Smokeless tobacco: Never  Vaping Use   Vaping status: Never Used  Substance and Sexual Activity   Alcohol use: No    Alcohol/week: 0.0 standard drinks of alcohol   Drug use: No   Sexual activity: Not Currently    Birth control/protection: Surgical  Other Topics Concern   Not on file  Social History Narrative   Not on file   Social Drivers of Health   Tobacco Use: Low Risk (01/07/2025)   Patient History    Smoking Tobacco Use: Never  Smokeless Tobacco Use: Never    Passive Exposure: Not on file  Financial Resource Strain: Not on file  Food Insecurity: Patient Unable To Answer (01/03/2025)   Epic    Worried About Programme Researcher, Broadcasting/film/video in the Last Year: Patient unable to answer    Ran Out of Food in the Last Year: Patient unable to answer  Transportation Needs: Patient Unable To Answer (01/03/2025)   Epic    Lack of Transportation (Medical): Patient unable to answer    Lack of Transportation (Non-Medical): Patient unable to answer  Physical Activity: Not on file  Stress: Not on file  Social Connections: Patient Unable To Answer (01/03/2025)   Social Connection and Isolation Panel    Frequency of Communication with Friends and  Family: Patient unable to answer    Frequency of Social Gatherings with Friends and Family: Patient unable to answer    Attends Religious Services: Patient unable to answer    Active Member of Clubs or Organizations: Patient unable to answer    Attends Banker Meetings: Patient unable to answer    Marital Status: Patient unable to answer  Recent Concern: Social Connections - Socially Isolated (11/08/2024)   Social Connection and Isolation Panel    Frequency of Communication with Friends and Family: More than three times a week    Frequency of Social Gatherings with Friends and Family: Twice a week    Attends Religious Services: Never    Database Administrator or Organizations: No    Attends Banker Meetings: Never    Marital Status: Widowed  Intimate Partner Violence: Patient Unable To Answer (01/03/2025)   Epic    Fear of Current or Ex-Partner: Patient unable to answer    Emotionally Abused: Patient unable to answer    Physically Abused: Patient unable to answer    Sexually Abused: Patient unable to answer  Depression (PHQ2-9): Low Risk (10/28/2024)   Depression (PHQ2-9)    PHQ-2 Score: 0  Alcohol Screen: Not on file  Housing: Unknown (01/03/2025)   Epic    Unable to Pay for Housing in the Last Year: Patient unable to answer    Number of Times Moved in the Last Year: Not on file    Homeless in the Last Year: Not on file  Utilities: Patient Unable To Answer (01/03/2025)   Epic    Threatened with loss of utilities: Patient unable to answer  Health Literacy: Not on file   Family History  Problem Relation Age of Onset   Heart disease Mother    Asthma Mother    COPD Father    Heart disease Father    Stroke Father    Asthma Daughter    Migraines Daughter    Neuropathy Son    Cancer Neg Hx    Anesthesia problems Neg Hx    Hypotension Neg Hx    Malignant hyperthermia Neg Hx    Pseudochol deficiency Neg Hx       VITAL SIGNS BP 132/88 (BP Location:  Right Arm)   Pulse 76   Temp 97.9 F (36.6 C)   Resp 19   Ht 5' 6 (1.676 m)   Wt 137 lb 12.8 oz (62.5 kg)   SpO2 96%   BMI 22.24 kg/m   Outpatient Encounter Medications as of 01/13/2025  Medication Sig Note   acetaminophen  (TYLENOL ) 325 MG tablet Take 2 tablets (650 mg total) by mouth every 6 (six) hours as needed for mild pain (pain score 1-3) (  or Fever >/= 101).    aspirin  EC 81 MG tablet Take 1 tablet (81 mg total) by mouth every morning. Swallow whole.    atorvastatin  (LIPITOR) 80 MG tablet Take 80 mg by mouth every morning.    busPIRone  (BUSPAR ) 5 MG tablet Take 5 mg by mouth 3 (three) times daily.    cyanocobalamin  (VITAMIN B12) 1000 MCG/ML injection Inject 1,000 mcg into the muscle every 30 (thirty) days. Given on the 23rd of each month    cycloSPORINE  (RESTASIS ) 0.05 % ophthalmic emulsion Place 1 drop into both eyes 2 (two) times daily. Given at 0900 and 2100    Estradiol  (IMVEXXY  MAINTENANCE PACK) 10 MCG INST Place 1 suppository vaginally 2 (two) times a week. Mondays and Thursdays    feeding supplement, GLUCERNA SHAKE, (GLUCERNA SHAKE) LIQD Take 237 mLs by mouth 3 (three) times daily between meals.    fluticasone  furoate-vilanterol (BREO ELLIPTA ) 200-25 MCG/ACT AEPB Inhale 1 puff into the lungs daily.    furosemide  (LASIX ) 20 MG tablet Take 1 tablet (20 mg total) by mouth every Monday, Wednesday, and Friday.    Glucerna (GLUCERNA) LIQD Take 237 mLs by mouth 2 (two) times daily.    insulin  glargine (LANTUS ) 100 UNIT/ML injection Inject 10 Units into the skin at bedtime.    insulin  lispro (HUMALOG) 100 UNIT/ML injection Inject 5 Units into the skin 3 (three) times daily with meals.    ipratropium-albuterol  (DUONEB) 0.5-2.5 (3) MG/3ML SOLN Take 3 mLs by nebulization every 4 (four) hours as needed. 12/22/2024: Two entries on MAR for same medication.   levothyroxine  (SYNTHROID ) 50 MCG tablet Take 50 mcg by mouth daily before breakfast.    metFORMIN  (GLUCOPHAGE ) 850 MG tablet Take 1  tablet (850 mg total) by mouth 2 (two) times daily with a meal.    midodrine  (PROAMATINE ) 10 MG tablet Take 1 tablet (10 mg total) by mouth 3 (three) times daily.    Multiple Vitamins-Minerals (CENTRUM SILVER PO) Take 1 tablet by mouth every morning.    omeprazole  (PRILOSEC) 20 MG capsule Take 1 capsule (20 mg total) by mouth 2 (two) times daily before a meal.    polyethylene glycol (MIRALAX  / GLYCOLAX ) 17 g packet Take 17 g by mouth daily as needed for mild constipation.    potassium chloride  (KLOR-CON ) 10 MEQ tablet Take 10 mEq by mouth every morning.    pregabalin  (LYRICA ) 150 MG capsule Take 1 capsule (150 mg total) by mouth 2 (two) times daily. Given at 0900 and 2100    rizatriptan  (MAXALT ) 10 MG tablet Take 10 mg by mouth daily as needed for migraine.    traZODone  (DESYREL ) 50 MG tablet Take 50 mg by mouth at bedtime.    TRINTELLIX  10 MG TABS tablet Take 10 mg by mouth every morning.    umeclidinium bromide  (INCRUSE ELLIPTA ) 62.5 MCG/ACT AEPB Inhale 1 puff into the lungs daily.    venlafaxine  (EFFEXOR -XR) 150 MG 24 hr capsule Take 150 mg by mouth every morning.    No facility-administered encounter medications on file as of 01/13/2025.     SIGNIFICANT DIAGNOSTIC EXAMS   LABS  12-10-24: wbc 13.9; hgb 11.2; hct 34.2; mcv 89.1 plt 245 glucose 176; bun 15; creat 0.59; k+ 4.7; na++ 135; ca 8.5 gfr >60; protein 6.4 albumin 3.9; blood culture: no growth; UA; neg 12-12-24: mag 1.5 12-15-24; wbc 10.1; hgb 10.7; hct 336.; mcv 90.3 pt 227; glucose 174; bun 6; creat 0.52; k+ 4.4; na++ 137; ca 8.6; gfr >60 mag 1.9 12-22-24: wbc  7.1; hgb 11.6; hct 37.0; mcv 90.7 plt 368; glucose 63; bun 19; creat 0.68; k+ 4.0; na++ 135; ca 8.2 gfr >60 protein 6.3 albumin 3.4    TODAY  01-02-25: wbc 20.5; hgb 14.2; hct 43.2; mcv 88.3 plt 340; glucose 153; bun 14.2; creat 1.13; k+ 4.7; na++ 131; ca 9.2; protein 7.1; albumin 3.5 tsh 0.514 lactic acid 2.8    Review of Systems  Constitutional:  Negative for  malaise/fatigue.  Respiratory:  Negative for cough and shortness of breath.   Cardiovascular:  Negative for chest pain, palpitations and leg swelling.  Gastrointestinal:  Negative for abdominal pain, constipation and heartburn.  Musculoskeletal:  Negative for back pain, joint pain and myalgias.  Skin: Negative.   Neurological:  Negative for dizziness.  Psychiatric/Behavioral:  The patient is not nervous/anxious.      Physical Exam Constitutional:      General: She is not in acute distress.    Appearance: She is well-developed. She is not diaphoretic.  Neck:     Thyroid : No thyromegaly.  Cardiovascular:     Rate and Rhythm: Normal rate and regular rhythm.     Heart sounds: Normal heart sounds.  Pulmonary:     Effort: Pulmonary effort is normal. No respiratory distress.     Breath sounds: Normal breath sounds.  Abdominal:     General: Bowel sounds are normal. There is no distension.     Palpations: Abdomen is soft.     Tenderness: There is no abdominal tenderness.  Musculoskeletal:        General: Normal range of motion.     Cervical back: Neck supple.     Right lower leg: No edema.     Left lower leg: No edema.  Lymphadenopathy:     Cervical: No cervical adenopathy.  Skin:    General: Skin is warm and dry.  Neurological:     Mental Status: She is alert. Mental status is at baseline.  Psychiatric:        Mood and Affect: Mood normal.       ASSESSMENT/ PLAN:  TODAY  Type 2 diabetes mellitus with peripheral neuropathy:  will lower lantus  to 12 units nightly and will hold lispro for < 50% of meals.    Barnie Seip NP [facility]  call [phone]      [1]  Allergies Allergen Reactions   Betadine [Povidone Iodine] Hives   Povidone-Iodine Hives   Penicillins Rash and Dermatitis   "

## 2025-01-22 ENCOUNTER — Encounter: Payer: Self-pay | Admitting: Internal Medicine

## 2025-01-22 ENCOUNTER — Non-Acute Institutional Stay (SKILLED_NURSING_FACILITY): Admitting: Internal Medicine

## 2025-01-22 DIAGNOSIS — Z794 Long term (current) use of insulin: Secondary | ICD-10-CM | POA: Diagnosis not present

## 2025-01-22 DIAGNOSIS — E1165 Type 2 diabetes mellitus with hyperglycemia: Secondary | ICD-10-CM

## 2025-01-22 DIAGNOSIS — F01C18 Vascular dementia, severe, with other behavioral disturbance: Secondary | ICD-10-CM

## 2025-01-22 DIAGNOSIS — J9601 Acute respiratory failure with hypoxia: Secondary | ICD-10-CM | POA: Diagnosis not present

## 2025-01-22 NOTE — Progress Notes (Unsigned)
 "   NURSING HOME LOCATION:  [facility] ROOM NUMBER:  [id number]  CODE STATUS: DNR  PCP: [name], MD   This is a nursing facility follow up visit for discharge from this facility.  Interim medical record and care since last SNF visit was updated with review of diagnostic studies and change in clinical status since last visit were documented.  HPI: She will be discharged to [facility] today. She was originally admitted to this facility on 12/22 after being hospitalized 12/16 - 12/16/2024 with acute metabolic encephalopathy.  CT of the head and MRI 12/16 revealed global parenchymal atrophy.Periventricular subcortical white matter patchy hypodensities were consistent with chronic small vessel ischemic disease.   The MRI confirmed multiple remote lacunar infarcts about the hemispheric cerebral white matter with scattered chronic remote bilateral cerebellar infarcts as well.  There were postsurgical changes related to previous resection of a pituitary tumor.  She was felt to be dehydrated and volume depleted in the context of diarrhea and decreased oral intake.  IV fluid resuscitation was completed. BUN was only 15.  Lactic acid level was 2 with a repeat value 1.8.  Protein/caloric malnutrition was suggested by an albumin of 3.2 and protein total protein of 6.  There was mild progression of anemia with final H/H of 10.7/33.6.  Peak leukocytosis was 13,900 with normalization to 10,100.  While hospitalized glucoses ranged from a low of 72 up to to 369, both outliers.  The A1c had been 6.5% on 11/09/2024.  The encephalopathy improved and she was felt to be @ her baseline owing discharge to the SNF for rehab. MMSE was performed here at the SNF revealing a score of 16 out of 30, compatible with dementia.  The CNS imaging studies document vascular dementia etiology.  She was rehospitalized 12/28 - 12/25/2024 with acute metabolic encephalopathy in the context of acute influenza A infection  complicated by acute hypoxic respiratory failure.  Tamiflu  had been initiated at the SNF, but the patient had progressive acute mental status change.  CT scan of the head revealed no acute changes from the baseline extensive vascular disease.  Aggressive pulmonary toilet was initiated with IV Solu-Medrol , bronchodilators, & mucolytics with improvement in her respiratory status and mental status.  She wales she was able to be weaned off oxygen to room air and transitioned to oral prednisone  40 mg daily for 3 days at discharge.  During the 12/28 - 12/25/2024 hospitalization,glucoses ranged from a low of 51 up to high of 307 in context of the IV Solu-Medrol . Despite the glucose of 307, she was discharged on metformin  alone at that time. Unfortunately, in the context of the parenteral Solu-Medrol  and high-dose oral steroids; hyperglycemia progressed with peak glucose of 592 despite NovoLog  injections @ the SNF.  She was sent back to the ED 1/2.  She received IV NovoLog  10 units as well as a liter of fluids within improvement in glucose to 205.  She was discharged back to the facility from the ED.   Course has been complicated by hypotension for which she was on midodrine  5 mg 3 times a day.  She was not on any antihypertensives. Nutritionist assessed the patient at the SNF because of a protein/caloric malnutrition.  She was readmitted 1/8 - 01/03/2025 with possible SIRS syndrome,presenting again as altered mental status for 48 hours PTA.  She exhibited dyspnea,cough and hypoxia necessitating 3 L of nasal O2.  Nutritional intake had decreased over the several days PTA.  Because  of the hypotension and potential drug-drug interaction Trintellix  was temporarily held. Lactic acid level was 2.8 white count 20,500 with a left shift.  Urine and blood cultures were negative.  Chest x-ray did not reveal any definite infiltrates.  IV Rocephin  and normal saline were initiated.  She did exhibit drop in the GFR with a value of 48  but at discharge it was 60 indicating CKD stage II.  There was mild progression of anemia with final values of 11/34.2.  The leukocytosis improved with a final value of 15,300.  TSH was low normal at 0.514.  She was felt to be stable enough to return to the nursing home. After the steroids were completed glucose control improved.  Over the 2 weeks prior to discharge Accu-Cheks AC and nightly have averaged 121.  During that same period, fasting blood glucoses were 65-173.  As noted in November 2025, her A1c had been 6.5%, indicating excellent control.  Insulin  was adjusted with discontinuation of the [facility] insulin  and decreasing the Lantus  dose to 12 units daily. Exacerbation of co-morbitidies as described above limited her ability participate with PT/OT.  She is to return to [facility] today.  Review of systems: Dementia invalidated responses.  When I introduced myself prior to the discharge interview and physical she stated you are right.  She states that she was doing fine.  She stated that she was going home.  When I asked where she lived she stated that it was in [place], but she could not give me the address.  She denied any other active symptoms.  Constitutional: No fever, significant weight change, fatigue  Eyes: No redness, discharge, pain, vision change ENT/mouth: No nasal congestion,  purulent discharge, earache, change in hearing, sore throat  Cardiovascular: No chest pain, palpitations, paroxysmal nocturnal dyspnea, claudication, edema  Respiratory: No cough, sputum production, hemoptysis, DOE, significant snoring, apnea   Gastrointestinal: No heartburn, dysphagia, abdominal pain, nausea /vomiting, rectal bleeding, melena, change in bowels Genitourinary: No dysuria, hematuria, pyuria, incontinence, nocturia Musculoskeletal: No joint stiffness, joint swelling, weakness, pain Dermatologic: No rash, pruritus, change in appearance of skin Neurologic: No dizziness, headache, syncope, seizures,  numbness, tingling Psychiatric: No significant anxiety, depression, insomnia, anorexia Endocrine: No change in hair/skin/nails, excessive thirst, excessive hunger, excessive urination  Hematologic/lymphatic: No significant bruising, lymphadenopathy, abnormal bleeding Allergy/immunology: No itchy/watery eyes, significant sneezing, urticaria, angioedema  Physical exam:  Pertinent or positive findings: She was sitting in a wheelchair wearing nasal oxygen.  She is hard of hearing.  Hair is thin over the crown.  Eyebrows are essentially absent.  Lower lids are puffy.  There is slight ptosis on the left.  She has minor low-grade rales at the bases.  Abdomen slightly protuberant.  Pedal pulses are not palpable.  She has limb atrophy and interosseous wasting.  General appearance:  no acute distress, increased work of breathing is present.   Lymphatic: No lymphadenopathy about the head, neck, axilla. Eyes: No conjunctival inflammation or lid edema is present. There is no scleral icterus. Ears:  External ear exam shows no significant lesions or deformities.   Nose:  External nasal examination shows no deformity or inflammation. Nasal mucosa are pink and moist without lesions, exudates Oral exam:  Lips and gums are healthy appearing. There is no oropharyngeal erythema or exudate. Neck:  No thyromegaly, masses, tenderness noted.    Heart:  Normal rate and regular rhythm. S1 and S2 normal without gallop, murmur, click, rub .  Lungs:  without wheezes, rhonchi,rubs. Abdomen: Bowel sounds are  normal. Abdomen is soft and nontender with no organomegaly, hernias, masses. GU: Deferred  Extremities:  No cyanosis, clubbing, edema  Neurologic exam :Balance, Rhomberg, finger to nose testing could not be completed due to clinical state Skin: Warm & dry w/o tenting. No significant lesions or rash.  See summary under each active problem in the Problem List with associated updated therapeutic plan :  Uncontrolled  type 2 diabetes mellitus with hyperglycemia, with long-term current use of insulin  (HCC) Peak glucose was 592 in the context of IV Solu-Medrol  and subsequent high-dose oral steroids. Diabetes  control has improved significantly after prednisone  completed.FBS range  has been  65-173 over past 2 weeks. Accuchecks ac & at bedtime have averaged 121.A1c was 6.5% on 11/09/2024.She will be discharged on 12 units of Lantus  daily  Acute hypoxic respiratory failure (HCC) She will be discharged on nasal O2. O2 saturation goal is @ least 90%.  Minimal rales noted on exam with no evidence of respiratory distress.  Continue to monitor closely  Vascular dementia [facility]) She has repeatedly presented with acute metabolic encephalopathy superimposed on baseline vascular dementia.  The triggers for the AMS have included clinical dehydration, influenza A infection, extreme hyperglycemia, and SIRS syndrome.  At present she is back at her baseline.  She stated that she was going home here in [place] but could not give me the address.  I have discussed the CNS imaging findings with her daughter who is a designer, fashion/clothing.   "

## 2025-01-22 NOTE — Patient Instructions (Signed)
 See assessment and plan under each diagnosis in the problem list and acutely for this visit

## 2025-01-22 NOTE — Assessment & Plan Note (Signed)
 Peak glucose was 592 in the context of IV Solu-Medrol  and subsequent high-dose oral steroids. Diabetes  control has improved significantly after prednisone  completed.FBS range  has been  65-173 over past 2 weeks. Accuchecks ac & at bedtime have averaged 121.A1c was 6.5% on 11/09/2024.She will be discharged on 12 units of Lantus  daily

## 2025-01-22 NOTE — Assessment & Plan Note (Signed)
 She will be discharged on nasal O2. O2 saturation goal is @ least 90%.  Minimal rales noted on exam with no evidence of respiratory distress.  Continue to monitor closely

## 2025-01-23 NOTE — Assessment & Plan Note (Signed)
 She has repeatedly presented with acute metabolic encephalopathy superimposed on baseline vascular dementia.  The triggers for the AMS have included clinical dehydration, influenza A infection, extreme hyperglycemia, and SIRS syndrome.  At present she is back at her baseline.  She stated that she was going home here in [place] but could not give me the address.  I have discussed the CNS imaging findings with her daughter who is a designer, fashion/clothing.

## 2025-05-20 ENCOUNTER — Ambulatory Visit: Admitting: Neurology

## 2025-06-11 ENCOUNTER — Ambulatory Visit: Admitting: Urology

## 2025-10-07 ENCOUNTER — Encounter: Admitting: Obstetrics and Gynecology

## 2025-10-23 ENCOUNTER — Other Ambulatory Visit

## 2025-10-27 ENCOUNTER — Inpatient Hospital Stay: Admitting: Internal Medicine
